# Patient Record
Sex: Male | Born: 1940 | Race: White | Hispanic: No | Marital: Married | Smoking: Former smoker
Health system: Southern US, Community
[De-identification: ages and names within clinical notes are randomized; demographics above are authoritative.]

## PROBLEM LIST (undated history)

## (undated) DIAGNOSIS — I4891 Unspecified atrial fibrillation: Principal | ICD-10-CM

## (undated) DIAGNOSIS — E785 Hyperlipidemia, unspecified: Secondary | ICD-10-CM

## (undated) DIAGNOSIS — N189 Chronic kidney disease, unspecified: Secondary | ICD-10-CM

## (undated) DIAGNOSIS — F419 Anxiety disorder, unspecified: Secondary | ICD-10-CM

## (undated) DIAGNOSIS — R0600 Dyspnea, unspecified: Secondary | ICD-10-CM

## (undated) DIAGNOSIS — D126 Benign neoplasm of colon, unspecified: Secondary | ICD-10-CM

## (undated) DIAGNOSIS — H3530 Unspecified macular degeneration: Secondary | ICD-10-CM

## (undated) DIAGNOSIS — H269 Unspecified cataract: Secondary | ICD-10-CM

## (undated) DIAGNOSIS — F329 Major depressive disorder, single episode, unspecified: Secondary | ICD-10-CM

## (undated) DIAGNOSIS — I10 Essential (primary) hypertension: Secondary | ICD-10-CM

## (undated) DIAGNOSIS — J449 Chronic obstructive pulmonary disease, unspecified: Secondary | ICD-10-CM

## (undated) DIAGNOSIS — F0390 Unspecified dementia without behavioral disturbance: Secondary | ICD-10-CM

## (undated) DIAGNOSIS — C61 Malignant neoplasm of prostate: Secondary | ICD-10-CM

## (undated) DIAGNOSIS — J209 Acute bronchitis, unspecified: Secondary | ICD-10-CM

## (undated) DIAGNOSIS — Z9114 Patient's other noncompliance with medication regimen: Secondary | ICD-10-CM

## (undated) DIAGNOSIS — C3490 Malignant neoplasm of unspecified part of unspecified bronchus or lung: Secondary | ICD-10-CM

## (undated) DIAGNOSIS — R7303 Prediabetes: Secondary | ICD-10-CM

## (undated) DIAGNOSIS — D1391 Familial adenomatous polyposis: Secondary | ICD-10-CM

## (undated) DIAGNOSIS — F32A Depression, unspecified: Secondary | ICD-10-CM

## (undated) DIAGNOSIS — D649 Anemia, unspecified: Secondary | ICD-10-CM

## (undated) HISTORY — DX: Chronic obstructive pulmonary disease, unspecified: J44.9

## (undated) HISTORY — DX: Acute bronchitis, unspecified: J20.9

## (undated) HISTORY — DX: Unspecified macular degeneration: H35.30

## (undated) HISTORY — DX: Malignant neoplasm of prostate: C61

## (undated) HISTORY — DX: Prediabetes: R73.03

## (undated) HISTORY — DX: Benign neoplasm of colon, unspecified: D12.6

## (undated) HISTORY — DX: Hyperlipidemia, unspecified: E78.5

## (undated) HISTORY — DX: Anxiety disorder, unspecified: F41.9

## (undated) HISTORY — PX: OTHER SURGICAL HISTORY: SHX169

## (undated) HISTORY — DX: Familial adenomatous polyposis: D13.91

## (undated) HISTORY — PX: TONSILLECTOMY: SUR1361

## (undated) HISTORY — DX: Unspecified cataract: H26.9

---

## 2001-10-17 ENCOUNTER — Ambulatory Visit: Admission: RE | Admit: 2001-10-17 | Discharge: 2002-01-15 | Payer: Self-pay | Admitting: Radiation Oncology

## 2001-11-19 ENCOUNTER — Encounter: Payer: Self-pay | Admitting: Urology

## 2001-11-22 ENCOUNTER — Encounter: Payer: Self-pay | Admitting: Urology

## 2001-11-26 ENCOUNTER — Encounter (INDEPENDENT_AMBULATORY_CARE_PROVIDER_SITE_OTHER): Payer: Self-pay | Admitting: Specialist

## 2001-11-26 ENCOUNTER — Inpatient Hospital Stay (HOSPITAL_COMMUNITY): Admission: RE | Admit: 2001-11-26 | Discharge: 2001-11-29 | Payer: Self-pay | Admitting: Urology

## 2001-11-26 HISTORY — PX: RETROPUBIC PROSTATECTOMY: SUR1055

## 2002-07-31 ENCOUNTER — Encounter (INDEPENDENT_AMBULATORY_CARE_PROVIDER_SITE_OTHER): Payer: Self-pay

## 2002-08-01 ENCOUNTER — Ambulatory Visit (HOSPITAL_COMMUNITY): Admission: RE | Admit: 2002-08-01 | Discharge: 2002-08-01 | Payer: Self-pay | Admitting: Gastroenterology

## 2003-10-20 ENCOUNTER — Encounter: Admission: RE | Admit: 2003-10-20 | Discharge: 2003-10-20 | Payer: Self-pay | Admitting: Urology

## 2003-11-04 ENCOUNTER — Ambulatory Visit: Admission: RE | Admit: 2003-11-04 | Discharge: 2004-01-27 | Payer: Self-pay | Admitting: Radiation Oncology

## 2003-11-18 ENCOUNTER — Ambulatory Visit (HOSPITAL_COMMUNITY): Admission: RE | Admit: 2003-11-18 | Discharge: 2003-11-18 | Payer: Self-pay | Admitting: Gastroenterology

## 2003-11-18 HISTORY — PX: ESOPHAGOGASTRODUODENOSCOPY: SHX1529

## 2003-11-18 HISTORY — PX: COLONOSCOPY W/ BIOPSIES: SHX1374

## 2004-02-23 ENCOUNTER — Ambulatory Visit: Admission: RE | Admit: 2004-02-23 | Discharge: 2004-02-23 | Payer: Self-pay | Admitting: Radiation Oncology

## 2004-07-26 ENCOUNTER — Ambulatory Visit: Admission: RE | Admit: 2004-07-26 | Discharge: 2004-07-26 | Payer: Self-pay | Admitting: Radiation Oncology

## 2005-09-22 ENCOUNTER — Ambulatory Visit: Payer: Self-pay | Admitting: Family Medicine

## 2005-09-25 ENCOUNTER — Ambulatory Visit: Payer: Self-pay | Admitting: Family Medicine

## 2005-11-03 ENCOUNTER — Ambulatory Visit: Payer: Self-pay | Admitting: Family Medicine

## 2005-11-09 ENCOUNTER — Ambulatory Visit: Payer: Self-pay | Admitting: Family Medicine

## 2006-07-11 ENCOUNTER — Ambulatory Visit: Payer: Self-pay | Admitting: Family Medicine

## 2006-11-02 ENCOUNTER — Ambulatory Visit: Payer: Self-pay | Admitting: Family Medicine

## 2007-06-26 ENCOUNTER — Emergency Department (HOSPITAL_COMMUNITY): Admission: EM | Admit: 2007-06-26 | Discharge: 2007-06-26 | Payer: Self-pay | Admitting: Emergency Medicine

## 2011-02-08 ENCOUNTER — Encounter: Payer: Self-pay | Admitting: Family Medicine

## 2011-02-08 DIAGNOSIS — J449 Chronic obstructive pulmonary disease, unspecified: Secondary | ICD-10-CM

## 2011-02-08 DIAGNOSIS — C61 Malignant neoplasm of prostate: Secondary | ICD-10-CM

## 2011-02-08 DIAGNOSIS — I10 Essential (primary) hypertension: Secondary | ICD-10-CM

## 2011-02-08 DIAGNOSIS — K469 Unspecified abdominal hernia without obstruction or gangrene: Secondary | ICD-10-CM | POA: Insufficient documentation

## 2011-03-31 NOTE — Op Note (Signed)
NAME:  Don [name], Don [name]                          ACCOUNT NO.:  [id number]   MEDICAL RECORD NO.:  [id number]                   PATIENT TYPE:  AMB   LOCATION:  ENDO                                 FACILITY:  [facility]   PHYSICIAN:  Earle Gell, M.D.                DATE OF BIRTH:  03-01-1941   DATE OF PROCEDURE:  11/18/2003  DATE OF DISCHARGE:                                 OPERATIVE REPORT   PROCEDURES:  Esophagogastroduodenoscopy and colonoscopy.   PROCEDURE INDICATION:  Mr. [name] is a 71 year old male, born  Mar 31, 1941.  On July 19, 2002, Mr. [name] underwent a  proctocolonoscopy to the cecum to evaluate guaiac positive stool.  From the  distal sigmoid colon, a small tubular adenomatous polyp was removed and from  the hepatic flexure, a small tubular adenomatous polyp was removed.  There  was no endoscopic evidence for the presence of colorectal cancer.   Mr. [name] intermittently passes fresh blood with bowel movements and has  passed black, tarry stools unassociated with abdominal pain.  He does not  take nonsteroidal anti-inflammatory medication.   Mr. [name] has undergone surgery for prostate cancer.  Due to a rising PSA,  he is scheduled to begin radiation therapy for prostate cancer.   ENDOSCOPIST:  Earle Gell, M.D.   PREMEDICATION:  1. Versed 7 mg.  2. Demerol 70 mg.   PROCEDURE:  Esophagogastroduodenoscopy.   After obtaining informed consent, Mr. [name] was placed in the left lateral  decubitus position.  I administered intravenous Demerol and intravenous  Versed to achieve conscious sedation for the procedure.  [name] blood  pressure, oxygen saturation, and cardiac rhythm were monitored throughout  the procedure and documented in the medical record.   The Olympus gastroscope was passed through the posterior hypopharynx into  the proximal esophagus without difficulty.  The hypopharynx, larynx, and  vocal cords appeared normal.   ESOPHAGOSCOPY:  The proximal, mid, and lower segments of the esophageal  mucosa appear normal.   GASTROSCOPY:  There is a hiatal hernia present.  Retroflexed view of the  gastric cardia and fundus was normal.  The gastric body, antrum, and pylorus  appear normal.   DUODENOSCOPY:  There are a few small erosions without bleeding in the  duodenal bulb; the mid and distal duodenum appear normal.   ASSESSMENT:  1. Esophagus normal esophagogastroduodenoscopy.  2. A few scattered erosions are present at the duodenal bulb of no clinical     significance.   PROCEDURE:  Proctocolonoscopy to the cecum.  Anal inspection was normal.  Digital rectal exam revealed a scarred anal canal.  The Olympus adjustable  pediatric colonoscope was introduced into the rectum and advanced to the  cecum.  Colonic preparation for the exam today was satisfactory.   RECTUM:  Normal.  SIGMOID COLON AND DESCENDING COLON:  Normal.  SPLENIC FLEXURE:  Normal.  TRANSVERSE COLON:  Normal.  HEPATIC  FLEXURE:  Normal.  ASCENDING COLON:  Normal.  CECUM AND ILEOCECAL VALVE:  Normal.   ASSESSMENT:  Severely scarred anal canal; normal proctocolonoscopy to the  cecum.   RECOMMENDATIONS:  Repeat colonoscopy in 5 years.                                               Earle Gell, M.D.    MJ/MEDQ  D:  11/18/2003  T:  11/18/2003  Job:  [id number]   cc:   Marshall Cork. Jeffie Pollock, M.D.  [place]  [place] [address]  Fax: [phone]

## 2011-03-31 NOTE — Op Note (Signed)
[facility]  Patient:    NORVAN, STETTLER Visit Number: [id number] MRN: [id number]          Service Type: SUR Location: 3W [id number] Attending Physician:  Hortencia Pilar Dictated by:   Marshall Cork. Jeffie Pollock, M.D. Proc. Date: 11/26/01 Admit Date:  11/26/2001   CC:         Candace Cruise, M.D., [facility]   Operative Report  PROCEDURE:  Radical retropubic prostatectomy with pelvic lymphadenectomy.  PREOPERATIVE DIAGNOSIS:  Prostate cancer.  POSTOPERATIVE DIAGNOSIS:  Prostate cancer.  SURGEON:  Marshall Cork. Jeffie Pollock, M.D.  ASSISTANT:  Duane Lope. Parks Neptune, M.D.  ANESTHESIA:  General.  DRAIN:  52 French Foley catheter and Blake drain.  SPECIMEN:  Right and left pelvic lymph nodes, prostate, and seminal vesicles.  COMPLICATIONS:  None.  INDICATIONS:  Mr. Manley is a 70 year old white male, who was sent for an elevated PSA of 10.  The biopsy demonstrated a Gleason 7 adenocarcinoma of the prostate involving 10% of the left biopsy cores.  No prostatic nodules were noted.  After discussing the treatment options, he elected radical retropubic prostatectomy.  FINDINGS AT PROCEDURE:  The patient was given 1 g of Ancef.  He was taken to the operating room where a general anesthetic was induced.  He was placed in the supine position with the table slightly flexed.  His lower abdomen was shaved.  He was prepped with Betadine solution and draped in the usual sterile fashion.  A 20 French Foley catheter was inserted after generous lubrication of the urethra.  The balloon was filled with 15 cc of sterile fluid, and the bladder was drained.  A lower midline incision was then made with the knife. The rectus muscles were parted in the midline and the right and left pelvic fossa were exposed with blunt dissection.  A Bookwalter retractor was placed, and node dissection was then performed, first on the right with the limits of dissection being the  external vein, the obturator nerve, the bifurcation of the iliac arteries, and the circumflex iliac vein.  The lymphatic vascular channels were controlled with Hem-o-lok clips.  Once the packet was removed, dissection was performed in an identical fashion on the left side.  We then turned our attention to the prostate gland.  The retractor was repositioned, and the pelvic fascia was incised on both sides and bluntly dissected from the puboprostatic ligaments down to the lateral aspect of the prostate.  It was then incised proximally using a right angle clamp and the Bovie.  Some dissection around the puboprostatic ligaments was performed to free them up. An Allis clamp was then used to grab the prostatic edge of the endopelvic fascia and bring them up into the midline, and a single figure-of-eight stitch was placed at the bladder neck and tied.  The Allis clamp was then removed.  A right angle clamp was placed beneath the dorsal vein complex, and a #1 Vicryl tie was brought around the dorsal vein complex and tied.  The Bovie was then used to divide the dorsal vein complex down to the anterior urethra.  An additional 2-0 Vicryl suture ligature was placed in the dorsal vein complex. A tonsillar clamp was then used to dissect the neurovascular bundles off the urethra laterally and right angles placed beneath the urethra.  A moistened umbilical tape was then passed under the urethra.  The anterior urethra was divided.  The Foley was grasped and pulled into the wound.  It  was clamped and cut and used to provide cephalad traction.  The posterior aspect of the urethra was then divided, and the umbilical tape was removed.  The rectourethralis muscles were taken down sharply, and the posterior aspect of the prostate was dissected off the rectal wall.  The lateral pedicles were then taken down using a right angle clamp and right angle clips with care being taken to avoid injury to the neurovascular  bundle.  Once the prostate had been reflected up sufficiently, the anterior leaf of Denonvilliers fascia was incised, exposing the ampulla of the vas and the seminal vesicles.  The ampulla of the vas were divided between large clips.  We then turned our attention anterior where the bladder neck was grasped between Allis clamps. Once again, a tonsillar clamp was used to dissect the muscle fibers from the bladder neck down to the mucosa.  The anterior aspect was opened;  the Foley balloon was drained, and the catheter was then used to provide prostatic traction.  The posterior bladder neck was then taken down in a similar fashion with a tonsil clamp and Bovie until the prostate was attached only by the seminal vesicles.  Clamps were placed across the base of the seminal vesicles, and then they were divided, and the specimen was removed.  This area was inspected for hemostasis.  A couple of small bleeders were dealt with with clips and the Bovie.  The patient had been given indigo carmine and ureteral orifices were noted to be well away from the bladder neck which was minimally disrupted by the dissection.  Then 4-0 chromic stitches were placed to evert the bladder neck mucosa; four stitches were placed, and a single 2-0 chromic figure-of-eight stitch was placed at the 6 oclock position to tighten the bladder neck slightly.  Once the bladder neck had been reconstructed, the pelvis was irrigated and inspected for hemostasis.  A small bleeder along the left bundle was controlled with a clip, with care to place this in a very tangential fashion.  A fresh Foley catheter was then inserted, and the anastomotic sutures were placed using 2-0 Vicryl.  The sutures were placed at 2, 5, 7, and 10 oclock.  A #1 Prolene was then placed through the eyes of the Foley and then brought through the bladder neck and out the anterior bladder wall.  The Foley was placed within the bladder.  The balloon was inflated  with 15 cc of sterile fluid, and the final 12 oclock anastomotic stitch was placed  through the bladder neck and urethral stump.  The retractors were relaxed, and the bladder neck was pulled down to the urethral stump, and the anastomotic sutures were tied and trimmed.  The anastomosis was tested and was found to be watertight.  The tethering suture was brought through the right abdominal wall.  A #10 flat fully-fluted Blake drain was placed through the left abdominal wall and secured with a 2-0 silk suture and placed to bulb suction. The drain itself was draped over the anastomosis into the pelvic fossa.  At this point, the tethering suture was tied over a button with care to ensure minimal tension, and the wound was then closed using a running #1 PDS suture. The subcutaneous tissues were irrigated, and the skin was closed with clips. A dressing was applied; the Foley was placed to straight drainage.  The patient was taken out of flexion.  His anesthetic was reversed.  He was moved to the recovery room in stable condition.  There were no complications during the procedure. Dictated by:   Marshall Cork. Jeffie Pollock, M.D. Attending Physician:  Hortencia Pilar DD:  11/26/01 TD:  11/26/01 Job: [id number]

## 2011-03-31 NOTE — Op Note (Signed)
   NAME:  Don [name], Don [name]                          ACCOUNT NO.:  [id number]   MEDICAL RECORD NO.:  [id number]                   PATIENT TYPE:  AMB   LOCATION:  ENDO                                 FACILITY:  [facility]   PHYSICIAN:  Earle Gell, MD                  DATE OF BIRTH:  03-31-1941   DATE OF PROCEDURE:  08/01/2002  DATE OF DISCHARGE:  08/01/2002                                 OPERATIVE REPORT   REFERRING PHYSICIAN:  Lolita Lenz, M.D.   PROCEDURE PERFORMED:  Colonoscopy.   ENDOSCOPIST:  Garlan Fair, M.D.   INDICATIONS FOR PROCEDURE:  The patient is a 70 year old  male born  1940/12/10.  [name] is undergoing a diagnostic colonoscopy to  evaluate guaiac positive stool.   PREMEDICATION:  Fentanyl 50 mcg, Versed 7 mg.   INSTRUMENT USED:  Pediatric Olympus video colonoscope.   DESCRIPTION OF PROCEDURE:  After obtaining informed consent, the patient was  placed in the left lateral decubitus position.  I administered intravenous  fentanyl and intravenous Versed to achieve conscious sedation for the  procedure.  [name] blood pressure, oxygen saturations and cardiac  rhythm were monitored throughout the procedure and documented in the medical  record.   Anal inspection was normal.  Digital rectal exam was normal.  The pediatric  Olympus video colonoscope was introduced into the rectum and easily advanced  to the cecum.  Colonic preparation for the exam today was excellent.   Rectum:  Normal.   Sigmoid colon and descending colon:  From the distal sigmoid colon at 20 cm  from the anal verge a 2 mm sessile polyp was removed with the electrocautery  snare.   Splenic flexure:  Normal.   Transverse colon:  Normal.   Hepatic flexure:  From the hepatic flexure, a 1 mm sessile polyp was removed  with a hot biopsy forceps.   Ascending colon:  Normal.   Ileocecal valve and cecum:  Normal.    ASSESSMENT:  1. From the hepatic flexure, a 1 mm  sessile polyp was removed.  2. From the distal sigmoid colon a 2 mm sessile polyp was removed.                                                 Earle Gell, MD    MJ/MEDQ  D:  08/01/2002  T:  08/04/2002  Job:  [id number]

## 2011-03-31 NOTE — Discharge Summary (Signed)
[facility]  Patient:    Don [name], Don [name] Visit Number: [id number] MRN: [id number]          Service Type: SUR Location: 3W [id number] Attending Physician:  Hortencia Pilar Dictated by:   Marshall Cork. Jeffie Pollock, M.D. Admit Date:  11/26/2001 Discharge Date: 11/29/2001                             Discharge Summary  HISTORY:  [name] is a 70 year old white male sent by Dr. Jeanie Cooks for a PSA elevation of 10.  He was initially treated with antibiotics; the PSA came down, but only to 8.84.  A prostate biopsy was performed which revealed 20% of the left prostate being involved with Gleasons 7 adenocarcinoma of the prostate.  The patient had significant obstructive voiding symptoms with a prostate symptom score of 17 and after discussing the options has elected radical prostatectomy.  ALLERGIES:  Significant for no allergies.  MEDICATIONS:  Include prostate herbal supplement, vitamin C crystals.  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  SURGICAL HISTORY:  Drainage of perirectal abscess in 1979 and hemorrhoidectomy in 1997.  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  He denies alcohol.  He works for [employer].  He is married.  FAMILY HISTORY:  Pertinent for heart disease, hypertension, diabetes.  REVIEW OF SYSTEMS:  He has had some cough, voiding difficulty, constipation, frequency, and urgency.  He is otherwise without complaints.  PHYSICAL EXAMINATION:  VITAL SIGNS:  Blood pressure 151/88, heart rate 72, temperature 97.8.  GENERAL:  He is a well-developed, well-nourished white male in no acute distress, alert and oriented x3.  HEENT:  Head/face normocephalic, atraumatic.  NECK:  Supple without thyromegaly or bruits.  LYMPHATICS:  He has no cervical, axillary, or inguinal adenopathy.  LUNGS:  Clear.  HEART:  Regular rate and rhythm.  ABDOMEN:  Soft, flat,  nontender without masses, lesions, or CVA tenderness, or hepatosplenomegaly.  GU:  Reveals an unremarkable phallus, normal meatus, scrotum is unremarkable. Testicles are bilaterally descended, normal in size and consistency without masses or tenderness.  Epididymis are unremarkable, no inguinal hernias are noted.  Anus and perineum without lesions.  RECTAL:  Reveals normal sphincter tone.  Prostate is 2+ in size and slightly tender without nodules.  Seminal vesicles are nonpalpable.  No rectal masses are noted.  EXTREMITIES:  Full range of motion without edema.  NEUROLOGIC:  Grossly intact.  SKIN:  Warm and dry.  IMPRESSION:  Prostate cancer.  PLAN:  Radical prostatectomy.  ACCESSORY CLINICAL INFORMATION:  Hemoglobin 14.3, hematocrit 40.5.  Chest x-ray:  COPD.  There was a questionable nodule in the left upper chest.  CT scan was obtained which was clear.  EKG revealed normal sinus rhythm with sinus arrhythmia, nonspecific ST abnormality which is minor.  Urine today is clear.  HOSPITAL COURSE:  On the day of admission, the patient was taken to the operating room where a radical retropubic prostatectomy with pelvic lymphadenectomy was performed without complications.  He was left with a Blake drain and Foley catheter.  Postoperatively, he did well on his first postoperative day.  He was afebrile; he was started on clear liquids. His Jackson-Pratt had minimal drainage and he had good bowel sounds. His O2 was discontinued, his PAS hose were removed, his IV fluids were decreased, and he was  encouraged to ambulate.  On the second postoperative day, he was tolerating liquids.  He was still sore.  He had not had a bowel movement and was still weak and not ready for discharge.  His Jackson-Pratt had minimal drainage and was removed.  His PCA morphine pump was removed.  His IV was hep-locked.  His diet was increased.  He was given a Dulcolax suppository and that produced a bowel  movement.  That evening, he was complaining of some indigestion; Pepcid was ordered with a good response.  On May 25, 2002, he was doing well without complaints and was felt to be ready for discharge home.  FINAL DIAGNOSES:  Prostate cancer with a pathology of T3b N0 M0 Gleasons 7 adenocarcinoma of the prostate.  The margin was microscopically positive in the area of the seminal vesicle and there was some involvement of the left seminal vesicle.  COMPLICATIONS:  There were no complications during this admission.  DISCHARGE MEDICATIONS:  Levaquin 250 mg q.d. to begin the day before catheter removal and Vicodin 1-2 p.o. q.4-6h. p.r.n. pain.  DISCHARGE INSTRUCTIONS:  His activity restrictions were explained.  He was instructed to follow up with me in one week for staple removal.  DISPOSITION:  Home.  PROGNOSIS:  Good.  CONDITION:  Improved. Dictated by:   Marshall Cork. Jeffie Pollock, M.D. Attending Physician:  Hortencia Pilar DD:  11/29/01 TD:  12/02/01 Job: [id number]

## 2011-06-15 ENCOUNTER — Other Ambulatory Visit: Payer: Self-pay | Admitting: Surgery

## 2011-08-08 ENCOUNTER — Emergency Department (HOSPITAL_COMMUNITY)
Admission: EM | Admit: 2011-08-08 | Discharge: 2011-08-09 | Disposition: A | Discharge status: Home / Self Care | Payer: Medicare Other | Attending: Emergency Medicine | Admitting: Emergency Medicine

## 2011-08-08 DIAGNOSIS — L851 Acquired keratosis [keratoderma] palmaris et plantaris: Secondary | ICD-10-CM | POA: Insufficient documentation

## 2011-08-08 DIAGNOSIS — F329 Major depressive disorder, single episode, unspecified: Secondary | ICD-10-CM | POA: Insufficient documentation

## 2011-08-08 DIAGNOSIS — R45851 Suicidal ideations: Secondary | ICD-10-CM | POA: Insufficient documentation

## 2011-08-08 DIAGNOSIS — F3289 Other specified depressive episodes: Secondary | ICD-10-CM | POA: Insufficient documentation

## 2011-08-08 DIAGNOSIS — Z8546 Personal history of malignant neoplasm of prostate: Secondary | ICD-10-CM | POA: Insufficient documentation

## 2011-08-08 DIAGNOSIS — I10 Essential (primary) hypertension: Secondary | ICD-10-CM | POA: Insufficient documentation

## 2011-08-08 LAB — COMPREHENSIVE METABOLIC PANEL
Alkaline Phosphatase: 95 U/L (ref 39–117)
BUN: 20 mg/dL (ref 6–23)
CO2: 26 mEq/L (ref 19–32)
GFR calc Af Amer: >60 mL/min (ref >60)
GFR calc non Af Amer: 54 mL/min — ABNORMAL LOW (ref >60)
Glucose, Bld: 99 mg/dL (ref 70–99)
Potassium: 4.5 mEq/L (ref 3.5–5.1)
Total Bilirubin: 0.3 mg/dL (ref 0.3–1.2)
Total Protein: 7.1 g/dL (ref 6.0–8.3)

## 2011-08-08 LAB — CBC
HCT: 40.8 % (ref 39.0–52.0)
Hemoglobin: 14.1 g/dL (ref 13.0–17.0)
MCHC: 34.6 g/dL (ref 30.0–36.0)

## 2011-08-08 LAB — DIFFERENTIAL
Basophils Absolute: 0 10*3/uL (ref 0.0–0.1)
Lymphocytes Relative: 26 % (ref 12–46)
Monocytes Absolute: 0.7 10*3/uL (ref 0.1–1.0)
Monocytes Relative: 10 % (ref 3–12)
Neutro Abs: 4.5 10*3/uL (ref 1.7–7.7)

## 2011-08-08 LAB — ETHANOL: Alcohol, Ethyl (B): <11 mg/dL (ref 0–11)

## 2011-08-08 LAB — RAPID URINE DRUG SCREEN, HOSP PERFORMED: Amphetamines: NOT DETECTED

## 2011-08-28 LAB — DIFFERENTIAL
Basophils Absolute: 0
Eosinophils Absolute: 0.2
Eosinophils Relative: 2

## 2011-08-28 LAB — COMPREHENSIVE METABOLIC PANEL
ALT: 26
AST: 27
CO2: 25
Chloride: 100
Creatinine, Ser: 1.84 — ABNORMAL HIGH
GFR calc Af Amer: 45 — ABNORMAL LOW
GFR calc non Af Amer: 37 — ABNORMAL LOW
Sodium: 135
Total Bilirubin: 1.6 — ABNORMAL HIGH

## 2011-08-28 LAB — CBC
MCV: 94.8
RBC: 3.95 — ABNORMAL LOW
WBC: 8

## 2013-02-13 ENCOUNTER — Telehealth: Payer: Self-pay | Admitting: Family Medicine

## 2013-02-13 NOTE — Telephone Encounter (Signed)
?  about referral for CT

## 2013-02-14 NOTE — Telephone Encounter (Signed)
Patient was advised to come in for chest xray first.  He has not come in yet

## 2013-02-14 NOTE — Telephone Encounter (Signed)
Pt aware needs chest xr and informed last cxr 12-15-2011

## 2013-03-12 ENCOUNTER — Other Ambulatory Visit: Payer: Self-pay | Admitting: *Deleted

## 2013-03-12 MED ORDER — ZOLPIDEM TARTRATE 10 MG PO TABS: 10.0000 mg | ORAL_TABLET | Freq: Every evening | ORAL | Status: DC | PRN

## 2013-03-12 NOTE — Telephone Encounter (Signed)
LAST REFILL 12/04/12. LAST OV 12/16/12. PLEASE CALL IN [facility]

## 2013-03-12 NOTE — Telephone Encounter (Signed)
Please call in [facility] 10 mg 1 PO qhs #30 0 refills

## 2013-03-13 NOTE — Telephone Encounter (Signed)
Pt aware rx for zolpidem called to [facility]

## 2013-03-20 ENCOUNTER — Telehealth: Payer: Self-pay | Admitting: Family Medicine

## 2013-03-20 NOTE — Telephone Encounter (Signed)
appt given for 5/9

## 2013-03-21 ENCOUNTER — Encounter: Payer: Self-pay | Admitting: General Practice

## 2013-03-21 ENCOUNTER — Ambulatory Visit (INDEPENDENT_AMBULATORY_CARE_PROVIDER_SITE_OTHER): Payer: Medicare Other | Admitting: General Practice

## 2013-03-21 VITALS — BP 140/78 | HR 71 | Temp 97.0°F | Ht 68.0 in | Wt 164.0 lb

## 2013-03-21 DIAGNOSIS — L0390 Cellulitis, unspecified: Secondary | ICD-10-CM

## 2013-03-21 DIAGNOSIS — L0291 Cutaneous abscess, unspecified: Secondary | ICD-10-CM

## 2013-03-21 MED ORDER — CEPHALEXIN 500 MG PO CAPS: 500.0000 mg | ORAL_CAPSULE | Freq: Two times a day (BID) | ORAL | Status: DC

## 2013-03-21 NOTE — Patient Instructions (Addendum)
Cellulitis Cellulitis is an infection of the skin and the tissue beneath it. The infected area is usually red and tender. Cellulitis occurs most often in the arms and lower legs.   CAUSES   Cellulitis is caused by bacteria that enter the skin through cracks or cuts in the skin. The most common types of bacteria that cause cellulitis are Staphylococcus and Streptococcus. SYMPTOMS    Redness and warmth.   Swelling.   Tenderness or pain.   Fever.  DIAGNOSIS  Your caregiver can usually determine what is wrong based on a physical exam. Blood tests may also be done. TREATMENT   Treatment usually involves taking an antibiotic medicine. HOME CARE INSTRUCTIONS    Take your antibiotics as directed. Finish them even if you start to feel better.   Keep the infected arm or leg elevated to reduce swelling.   Apply a warm cloth to the affected area up to 4 times per day to relieve pain.   Only take over-the-counter or prescription medicines for pain, discomfort, or fever as directed by your caregiver.   Keep all follow-up appointments as directed by your caregiver.  SEEK MEDICAL CARE IF:    You notice red streaks coming from the infected area.   Your red area gets larger or turns dark in color.   Your bone or joint underneath the infected area becomes painful after the skin has healed.   Your infection returns in the same area or another area.   You notice a swollen bump in the infected area.   You develop new symptoms.  SEEK IMMEDIATE MEDICAL CARE IF:    You have a fever.   You feel very sleepy.   You develop vomiting or diarrhea.   You have a general ill feeling (malaise) with muscle aches and pains.  MAKE SURE YOU:    Understand these instructions.   Will watch your condition.   Will get help right away if you are not doing well or get worse.  Document Released: 08/09/2005 Document Revised: 04/30/2012 Document Reviewed: 01/15/2012 [employer] Patient Information 2013  [employer].    

## 2013-03-21 NOTE — Progress Notes (Signed)
  Subjective:    Patient ID: Don [name], male    DOB: 1941/06/22, 72 y.o.   MRN: [id number]  HPI Presents today with red, irritated area to left mid back. Reports on Friday he felt a sore area on his back and reached around and scratched it. OTC medications not used. Denies knowingly being bitten by insect.     Review of Systems  Constitutional: Negative for fever and chills.  Respiratory: Negative for chest tightness and shortness of breath.   Cardiovascular: Negative for chest pain.  Musculoskeletal: Negative for myalgias.  Skin:       Red area to left mid back  Neurological: Negative for dizziness and headaches.  Psychiatric/Behavioral: Negative.        Objective:   Physical Exam  Constitutional: He is oriented to person, place, and time. He appears well-developed and well-nourished.  Cardiovascular: Normal rate, regular rhythm and normal heart sounds.   Pulmonary/Chest: Effort normal and breath sounds normal.  Neurological: He is alert and oriented to person, place, and time.  Skin: Skin is warm and dry. There is erythema.  Small erythematous area to mid left back, size of pencil eraser. Negative fluid expressed.   Psychiatric: He has a normal mood and affect.          Assessment & Plan:  1. Cellulitis - cephALEXin (KEFLEX) 500 MG capsule; Take 1 capsule (500 mg total) by mouth 2 (two) times daily.  Dispense: 20 capsule; Refill: 0 Keep area clean and dry RTO if symptoms worsen or unresolved Patient verbalized understanding Erby Pian, FNP-C

## 2013-04-09 ENCOUNTER — Other Ambulatory Visit: Payer: Self-pay | Admitting: Family Medicine

## 2013-04-13 DIAGNOSIS — Z91148 Patient's other noncompliance with medication regimen for other reason: Secondary | ICD-10-CM

## 2013-04-13 DIAGNOSIS — Z9114 Patient's other noncompliance with medication regimen: Secondary | ICD-10-CM

## 2013-04-13 HISTORY — DX: Patient's other noncompliance with medication regimen: Z91.14

## 2013-04-13 HISTORY — DX: Patient's other noncompliance with medication regimen for other reason: Z91.148

## 2013-04-25 ENCOUNTER — Emergency Department (HOSPITAL_COMMUNITY): Payer: Medicare Other

## 2013-04-25 ENCOUNTER — Inpatient Hospital Stay (HOSPITAL_COMMUNITY)
Admission: EM | Admit: 2013-04-25 | Discharge: 2013-04-28 | DRG: 310 | Disposition: A | Discharge status: Home / Self Care | Payer: Medicare Other | Attending: Internal Medicine | Admitting: Internal Medicine

## 2013-04-25 ENCOUNTER — Encounter (HOSPITAL_COMMUNITY): Payer: Self-pay | Admitting: *Deleted

## 2013-04-25 ENCOUNTER — Telehealth: Payer: Self-pay | Admitting: Family Medicine

## 2013-04-25 DIAGNOSIS — I517 Cardiomegaly: Secondary | ICD-10-CM

## 2013-04-25 DIAGNOSIS — Z720 Tobacco use: Secondary | ICD-10-CM | POA: Diagnosis present

## 2013-04-25 DIAGNOSIS — J4489 Other specified chronic obstructive pulmonary disease: Secondary | ICD-10-CM | POA: Diagnosis present

## 2013-04-25 DIAGNOSIS — Z79899 Other long term (current) drug therapy: Secondary | ICD-10-CM

## 2013-04-25 DIAGNOSIS — I10 Essential (primary) hypertension: Secondary | ICD-10-CM | POA: Diagnosis present

## 2013-04-25 DIAGNOSIS — Z8249 Family history of ischemic heart disease and other diseases of the circulatory system: Secondary | ICD-10-CM

## 2013-04-25 DIAGNOSIS — K469 Unspecified abdominal hernia without obstruction or gangrene: Secondary | ICD-10-CM

## 2013-04-25 DIAGNOSIS — I4891 Unspecified atrial fibrillation: Secondary | ICD-10-CM | POA: Diagnosis not present

## 2013-04-25 DIAGNOSIS — I4892 Unspecified atrial flutter: Principal | ICD-10-CM | POA: Diagnosis present

## 2013-04-25 DIAGNOSIS — C61 Malignant neoplasm of prostate: Secondary | ICD-10-CM

## 2013-04-25 DIAGNOSIS — Z8546 Personal history of malignant neoplasm of prostate: Secondary | ICD-10-CM

## 2013-04-25 DIAGNOSIS — F17200 Nicotine dependence, unspecified, uncomplicated: Secondary | ICD-10-CM | POA: Diagnosis present

## 2013-04-25 DIAGNOSIS — E785 Hyperlipidemia, unspecified: Secondary | ICD-10-CM | POA: Diagnosis present

## 2013-04-25 DIAGNOSIS — J449 Chronic obstructive pulmonary disease, unspecified: Secondary | ICD-10-CM | POA: Diagnosis present

## 2013-04-25 DIAGNOSIS — Z9079 Acquired absence of other genital organ(s): Secondary | ICD-10-CM

## 2013-04-25 DIAGNOSIS — Z833 Family history of diabetes mellitus: Secondary | ICD-10-CM

## 2013-04-25 LAB — HEPATIC FUNCTION PANEL
AST: 88 U/L — ABNORMAL HIGH (ref 0–37)
Albumin: 3.3 g/dL — ABNORMAL LOW (ref 3.5–5.2)
Bilirubin, Direct: 0.3 mg/dL (ref 0.0–0.3)

## 2013-04-25 LAB — BASIC METABOLIC PANEL
Chloride: 101 mEq/L (ref 96–112)
Creatinine, Ser: 1.34 mg/dL (ref 0.50–1.35)
GFR calc Af Amer: 59 mL/min — ABNORMAL LOW (ref >90)
Potassium: 4.3 mEq/L (ref 3.5–5.1)

## 2013-04-25 LAB — CBC
Platelets: 217 10*3/uL (ref 150–400)
RDW: 13.7 % (ref 11.5–15.5)
WBC: 9.1 10*3/uL (ref 4.0–10.5)

## 2013-04-25 LAB — MRSA PCR SCREENING: MRSA by PCR: NEGATIVE

## 2013-04-25 LAB — TROPONIN I
Troponin I: <0.3 ng/mL (ref <0.30)
Troponin I: <0.3 ng/mL (ref <0.30)

## 2013-04-25 MED ORDER — TIOTROPIUM BROMIDE MONOHYDRATE 18 MCG IN CAPS
18.0000 ug | ORAL_CAPSULE | Freq: Every day | RESPIRATORY_TRACT | Status: DC | PRN
  Filled 2013-04-25: qty 5

## 2013-04-25 MED ORDER — ZOLPIDEM TARTRATE 5 MG PO TABS
5.0000 mg | ORAL_TABLET | Freq: Every evening | ORAL | Status: DC | PRN
  Administered 2013-04-25 – 2013-04-27 (×3): 5 mg via ORAL
  Filled 2013-04-25 (×3): qty 1

## 2013-04-25 MED ORDER — ZOLPIDEM TARTRATE 5 MG PO TABS: 10.0000 mg | ORAL_TABLET | Freq: Every evening | ORAL | Status: DC | PRN

## 2013-04-25 MED ORDER — METHYLPREDNISOLONE SODIUM SUCC 125 MG IJ SOLR
80.0000 mg | Freq: Two times a day (BID) | INTRAMUSCULAR | Status: DC
  Administered 2013-04-25 (×2): 80 mg via INTRAVENOUS
  Filled 2013-04-25 (×2): qty 2

## 2013-04-25 MED ORDER — ACETAMINOPHEN 325 MG PO TABS
650.0000 mg | ORAL_TABLET | Freq: Four times a day (QID) | ORAL | Status: DC | PRN
  Administered 2013-04-25: 650 mg via ORAL
  Filled 2013-04-25: qty 2

## 2013-04-25 MED ORDER — AMLODIPINE BESYLATE 5 MG PO TABS: 10.0000 mg | ORAL_TABLET | Freq: Every day | ORAL | Status: DC

## 2013-04-25 MED ORDER — OMEGA-3 FATTY ACIDS 1000 MG PO CAPS: 1.0000 g | ORAL_CAPSULE | Freq: Two times a day (BID) | ORAL | Status: DC

## 2013-04-25 MED ORDER — POLYVINYL ALCOHOL 1.4 % OP SOLN
1.0000 [drp] | Freq: Two times a day (BID) | OPHTHALMIC | Status: DC
  Administered 2013-04-25 – 2013-04-28 (×6): 1 [drp] via OPHTHALMIC
  Filled 2013-04-25: qty 15

## 2013-04-25 MED ORDER — SIMVASTATIN 20 MG PO TABS: 20.0000 mg | ORAL_TABLET | Freq: Every day | ORAL | Status: DC

## 2013-04-25 MED ORDER — RIVAROXABAN 10 MG PO TABS
15.0000 mg | ORAL_TABLET | Freq: Every day | ORAL | Status: DC
  Administered 2013-04-25: 15 mg via ORAL
  Filled 2013-04-25: qty 2

## 2013-04-25 MED ORDER — DILTIAZEM HCL 25 MG/5ML IV SOLN
10.0000 mg | Freq: Once | INTRAVENOUS | Status: AC
  Administered 2013-04-25 (×2): 10 mg via INTRAVENOUS

## 2013-04-25 MED ORDER — DILTIAZEM HCL 100 MG IV SOLR
5.0000 mg/h | INTRAVENOUS | Status: DC
  Administered 2013-04-25: 10 mg/h via INTRAVENOUS
  Administered 2013-04-25: 5 mg/h via INTRAVENOUS
  Administered 2013-04-25: 10 mg/h via INTRAVENOUS

## 2013-04-25 MED ORDER — CLONAZEPAM 0.5 MG PO TABS
0.5000 mg | ORAL_TABLET | Freq: Two times a day (BID) | ORAL | Status: DC | PRN
  Administered 2013-04-25 – 2013-04-27 (×5): 0.5 mg via ORAL
  Filled 2013-04-25 (×5): qty 1

## 2013-04-25 MED ORDER — OMEGA-3-ACID ETHYL ESTERS 1 G PO CAPS
1.0000 g | ORAL_CAPSULE | Freq: Two times a day (BID) | ORAL | Status: DC
  Administered 2013-04-25 – 2013-04-28 (×6): 1 g via ORAL
  Filled 2013-04-25 (×6): qty 1

## 2013-04-25 MED ORDER — TAMSULOSIN HCL 0.4 MG PO CAPS
0.4000 mg | ORAL_CAPSULE | Freq: Every day | ORAL | Status: DC
  Administered 2013-04-26 – 2013-04-28 (×3): 0.4 mg via ORAL
  Filled 2013-04-25 (×3): qty 1

## 2013-04-25 MED ORDER — POLYETHYL GLYCOL-PROPYL GLYCOL 0.4-0.3 % OP SOLN: 1.0000 [drp] | Freq: Two times a day (BID) | OPHTHALMIC | Status: DC

## 2013-04-25 MED ORDER — SODIUM CHLORIDE 0.9 % IJ SOLN
3.0000 mL | Freq: Two times a day (BID) | INTRAMUSCULAR | Status: DC
  Administered 2013-04-25 – 2013-04-28 (×5): 3 mL via INTRAVENOUS

## 2013-04-25 MED ORDER — ADULT MULTIVITAMIN W/MINERALS CH: 1.0000 | ORAL_TABLET | Freq: Every day | ORAL | Status: DC

## 2013-04-25 MED ORDER — ATORVASTATIN CALCIUM 10 MG PO TABS
10.0000 mg | ORAL_TABLET | Freq: Every day | ORAL | Status: DC
  Administered 2013-04-25 – 2013-04-27 (×3): 10 mg via ORAL
  Filled 2013-04-25 (×3): qty 1

## 2013-04-25 MED ORDER — ADULT MULTIVITAMIN W/MINERALS CH
1.0000 | ORAL_TABLET | Freq: Every day | ORAL | Status: DC
  Administered 2013-04-26 – 2013-04-28 (×3): 1 via ORAL
  Filled 2013-04-25 (×3): qty 1

## 2013-04-25 MED ORDER — DULOXETINE HCL 60 MG PO CPEP
60.0000 mg | ORAL_CAPSULE | Freq: Every day | ORAL | Status: DC
  Administered 2013-04-26 – 2013-04-28 (×3): 60 mg via ORAL
  Filled 2013-04-25 (×3): qty 1

## 2013-04-25 MED ORDER — HEPARIN SODIUM (PORCINE) 5000 UNIT/ML IJ SOLN: 5000.0000 [IU] | Freq: Three times a day (TID) | INTRAMUSCULAR | Status: DC

## 2013-04-25 MED ORDER — BUDESONIDE-FORMOTEROL FUMARATE 160-4.5 MCG/ACT IN AERO
2.0000 | INHALATION_SPRAY | Freq: Two times a day (BID) | RESPIRATORY_TRACT | Status: DC | PRN
  Administered 2013-04-26: 2 via RESPIRATORY_TRACT
  Filled 2013-04-25: qty 6

## 2013-04-25 MED ORDER — DIGOXIN 250 MCG PO TABS
0.2500 mg | ORAL_TABLET | Freq: Every day | ORAL | Status: DC
  Administered 2013-04-26 – 2013-04-28 (×3): 0.25 mg via ORAL
  Filled 2013-04-25 (×3): qty 1

## 2013-04-25 MED ORDER — DIGOXIN 0.25 MG/ML IJ SOLN
0.5000 mg | Freq: Once | INTRAMUSCULAR | Status: AC
  Administered 2013-04-25: 0.5 mg via INTRAVENOUS
  Filled 2013-04-25: qty 2

## 2013-04-25 MED ORDER — OCUVITE PO TABS
1.0000 | ORAL_TABLET | Freq: Every day | ORAL | Status: DC
  Administered 2013-04-26 – 2013-04-28 (×3): 1 via ORAL
  Filled 2013-04-25 (×5): qty 1

## 2013-04-25 MED ORDER — SODIUM CHLORIDE 0.9 % IV SOLN
INTRAVENOUS | Status: DC
  Administered 2013-04-26: via INTRAVENOUS

## 2013-04-25 MED ORDER — DIGOXIN 0.25 MG/ML IJ SOLN
0.2500 mg | Freq: Once | INTRAMUSCULAR | Status: AC
  Administered 2013-04-25: 0.25 mg via INTRAVENOUS
  Filled 2013-04-25: qty 2

## 2013-04-25 MED ORDER — ASPIRIN EC 81 MG PO TBEC
81.0000 mg | DELAYED_RELEASE_TABLET | Freq: Every day | ORAL | Status: DC
  Administered 2013-04-26 – 2013-04-28 (×3): 81 mg via ORAL
  Filled 2013-04-25 (×3): qty 1

## 2013-04-25 NOTE — Progress Notes (Signed)
Information requested via fax from Dr Einar Gip, in [place].

## 2013-04-25 NOTE — H&P (Signed)
[facility] History and Physical  Don [name] [id number] DOB: 08-17-1941 DOA: 04/25/2013  Referring physician: ER. PCP: Anthoney Harada, MD    Chief Complaint: Chest pain, palpitations, dyspnea.  HPI: Don [name] is a 72 y.o. male who presents to the hospital with a 2 to three-day history of headaches, chest pain, palpitations and dyspnea associated with a cough productive of white sputum. He has had no fever. He does not have a history of coronary artery disease but he does have a history of COPD. He continues to smoke cigarettes. He has been feeling somewhat lightheaded and dizzy with the palpitations. When he presented to the emergency room, he was found to be in atrial flutter with rapid ventricular response around 160. He has been started on a Cardizem drip and his ventricular rate is much better controlled now. He cannot be very specific about the type of chest pain he was having. His initial cardiac enzyme is negative. He is now being referred for remission.  Review of Systems:   Apart from history of present illness, other systems negative.  Past Medical History  Diagnosis Date  . Hyperlipidemia   . Hypertension   . COPD (chronic obstructive pulmonary disease)   . Cancer    Past Surgical History  Procedure Laterality Date  . 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 Anal abcess,hemorroids,     Social History:  reports that he has been smoking Cigarettes and Cigars.  He has been smoking about 0.00 packs per day. He does not have any smokeless tobacco history on file. He reports that he does not drink alcohol or use illicit drugs.    Allergies  Allergen Reactions  . Bupropion Anxiety    Family History  Problem Relation Age of Onset  . Diabetes Father   . Heart disease Father     MI      Prior to Admission medications   Medication Sig Start Date End Date Taking? Authorizing Provider  amLODipine (NORVASC) 10 MG tablet Take 10 mg by mouth daily.   Yes  Historical Provider, MD  aspirin EC 81 MG tablet Take 81 mg by mouth daily.   Yes Historical Provider, MD  beta carotene w/minerals (OCUVITE) tablet Take 1 tablet by mouth daily.   Yes Historical Provider, MD  budesonide-formoterol (SYMBICORT) 160-4.5 MCG/ACT inhaler Inhale 2 puffs into the lungs 2 (two) times daily as needed.     Yes Historical Provider, MD  clonazePAM (KLONOPIN) 0.5 MG tablet Take 0.5 mg by mouth 2 (two) times daily as needed for anxiety.  03/04/13  Yes Historical Provider, MD  DULoxetine (CYMBALTA) 60 MG capsule  03/17/13  Yes Historical Provider, MD  fish oil-omega-3 fatty acids 1000 MG capsule Take 1 g by mouth 2 (two) times daily.   Yes Historical Provider, MD  fluorouracil (EFUDEX) 5 % cream Apply 1 application topically daily as needed.  03/13/13  Yes Historical Provider, MD  mometasone (ELOCON) 0.1 % cream Apply 1 application topically 2 (two) times daily as needed.  03/13/13  Yes Historical Provider, MD  Multiple Vitamin (MULTIVITAMIN WITH MINERALS) TABS Take 1 tablet by mouth daily.   Yes Historical Provider, MD  Polyethyl Glycol-Propyl Glycol (SYSTANE OP) Place 1 drop into both eyes 2 (two) times daily.   Yes Historical Provider, MD  tamsulosin (FLOMAX) 0.4 MG CAPS Take 0.4 mg by mouth daily.   Yes Historical Provider, MD  tiotropium (SPIRIVA) 18 MCG inhalation capsule Place 18 mcg into inhaler and inhale as needed.  Yes Historical Provider, MD  zolpidem (AMBIEN) 10 MG tablet Take 10 mg by mouth at bedtime as needed for sleep.   Yes Historical Provider, MD  pravastatin (PRAVACHOL) 40 MG tablet Take 40 mg by mouth daily.    Historical Provider, MD   Physical Exam: Filed Vitals:   04/25/13 0947 04/25/13 1000 04/25/13 1020 04/25/13 1123  BP: 113/90 118/84 119/82 118/88  Pulse: 119 69 60 69  Temp:      TempSrc:      Resp: 20 11 13 17   SpO2: 95% 97% 96% 96%     General:  He looks systemically well. Is not toxic or septic.  Eyes: No pallor. No jaundice.  ENT: No  abnormalities.  Neck: No lymphadenopathy.  Cardiovascular: Irregular heart sounds. No murmurs. Jugular venous pressure not raised.  Respiratory: Bilateral expiratory wheezing without increased work of breathing. There is no peripheral or central cyanosis.  Abdomen: Soft, nontender. No masses felt. No hepatosplenomegaly.  Skin: No rash.  Musculoskeletal: No acute joint abnormalities.  Psychiatric: Appropriate affect.  Neurologic: Alert and orientated without any focal neurological signs.  Labs on Admission:  Basic Metabolic Panel:  Recent Labs Lab 04/25/13 0922  NA 136  K 4.3  CL 101  CO2 24  GLUCOSE 117*  BUN 23  CREATININE 1.34  CALCIUM 9.0   Liver Function Tests:  Recent Labs Lab 04/25/13 0922  AST 88*  ALT 95*  ALKPHOS 138*  BILITOT 1.2  PROT 6.7  ALBUMIN 3.3*     CBC:  Recent Labs Lab 04/25/13 0922  WBC 9.1  HGB 13.7  HCT 39.3  MCV 95.9  PLT 217   Cardiac Enzymes:  Recent Labs Lab 04/25/13 0922  TROPONINI <0.30       Radiological Exams on Admission: Dg Chest Port 1 View  04/25/2013   *RADIOLOGY REPORT*  Clinical Data: Chest pain, shortness of breath, tachycardia, history of hypertension and COPD  PORTABLE CHEST - 1 VIEW  Comparison: None.  Findings: Normal cardiac silhouette and mediastinal contours.  The lungs appear hyperinflated with flattening of the bilateral hemidiaphragms.  There is mild diffuse slightly nodular thickening of the pulmonary interstitium.  No definite pleural effusion, though note, the right costophrenic angle is excluded from view. No definite evidence of edema.  No pneumothorax.  No definite acute osseous abnormality.  IMPRESSION: Hyperexpanded lungs and bronchitic change without definite acute cardiopulmonary disease on this AP portable examination.  Further evaluation with a PA and lateral chest radiograph may be obtained as clinically indicated.   Original Report Authenticated By: Jake Seats, MD    EKG:  Independently reviewed. Atrial flutter, no ischemic changes seen.  Assessment/Plan   1. Atrial flutter with rapid ventricle response. 2. COPD with wheezing/bronchospasm. 3. Hypertension. 4. Ongoing tobacco abuse.  Plan: 1. Admit to step down unit with Cardizem drip. 2. Serial cardiac enzymes. 3. Cardiology consultation. 4. Echocardiogram. 5. Intravenous steroids for COPD/bronchospasm. I do not think he warrants antibiotics at the present time. Further recommendations will depend on patient's hospital progress.   Code Status: Full code.   Family Communication: Discussed plan with patient at the bedside.   Disposition Plan: Home when medically stable.   Time spent: 45 minutes.  Doree Albee [facility] Pager [phone].  If 7PM-7AM, please contact night-coverage [email or web address] Password [facility] 04/25/2013, 11:45 AM

## 2013-04-25 NOTE — Progress Notes (Signed)
Pt started on Xarelto per MD order. Medication gone over with pt using teach back method. Handout provided. All questions and concerns answered.

## 2013-04-25 NOTE — Progress Notes (Addendum)
.   ANTICOAGULATION CONSULT NOTE - Initial Consult  Pharmacy Consult for Xarelto  (Rivaroxaban)  Indication: Atrial Fibrillation, Nonvalvular  Allergies  Allergen Reactions  . Bupropion Anxiety    Patient Measurements: Height: 5\' 9"  (175.3 cm) IBW/kg (Calculated) : 70.7   Vital Signs: Temp: 98.8 F (37.1 C) (06/13 1200) Temp src: Oral (06/13 1200) BP: 126/81 mmHg (06/13 1230) Pulse Rate: 108 (06/13 1230)  Labs:  Recent Labs  04/25/13 0922  HGB 13.7  HCT 39.3  PLT 217  CREATININE 1.34  TROPONINI <0.30    The CrCl is unknown because both a height and weight (above a minimum accepted value) are required for this calculation.   Medical History: Past Medical History  Diagnosis Date  . Hyperlipidemia   . Hypertension   . COPD (chronic obstructive pulmonary disease)   . Cancer     Medications:  Scheduled:  . sodium chloride   Intravenous STAT  . [START ON 04/26/2013] aspirin EC  81 mg Oral Daily  . atorvastatin  10 mg Oral q1800  . [START ON 04/26/2013] beta carotene w/minerals  1 tablet Oral Daily  . digoxin  0.25 mg Intravenous Once  . digoxin  0.5 mg Intravenous Once  . [START ON 04/26/2013] digoxin  0.25 mg Oral Daily  . [START ON 04/26/2013] DULoxetine  60 mg Oral Daily  . methylPREDNISolone (SOLU-MEDROL) injection  80 mg Intravenous Q12H  . [START ON 04/26/2013] multivitamin with minerals  1 tablet Oral Daily  . omega-3 acid ethyl esters  1 g Oral BID  . polyvinyl alcohol  1 drop Both Eyes BID  . rivaroxaban  15 mg Oral Q supper  . sodium chloride  3 mL Intravenous Q12H  . tamsulosin  0.4 mg Oral Daily    Assessment: New onset atrial fibrillation, non valvular SCR 1.34 Calculated CrCl 49.1 ml/min  Goal of Therapy:  Stroke/Systemic embolism prophylaxis Monitor platelets by anticoagulation protocol: Yes   Plan:  Xarelto 15 mg po daily with evening meal (CrCl < 50 ml/min) Monitor renal function Monitor for signs of bleeding Labs per  protocol  Abner Greenspan, Jaslyn Bansal Bennett 04/25/2013,2:21 PM

## 2013-04-25 NOTE — ED Notes (Signed)
Mid cp with sob and dizziness that started last night.  States pain is worse with deep breath.  C/o cough also.

## 2013-04-25 NOTE — Consult Note (Signed)
CARDIOLOGY CONSULT NOTE  Patient ID: Don [name] MRN: [id number] DOB/AGE: December 19, 1940 72 y.o.  Admit date: 04/25/2013 Referring Physician: PTH-Gosrani Primary PhysicianWONG,FRANCIS PATRICK, MD Primary Cardiologist: Einar Gip Reason for Consultation: New onset Atrial flutter with RVR Active Problems:   COPD (chronic obstructive pulmonary disease)   Hypertension   Atrial flutter with rapid ventricular response   Tobacco abuse  HPI:  Patient is a 72 yo who presented to ER today with SOB, chest tightness. fatigue. The patient says over the past few days he has developed chest tightness, SOB>  He says his heart rate has been elevated.  Giving out easlily  Has no energy  Mild dizziness  No syncope.  Not slept well he thinks because of poor breathing  He called his primary MD who told him to go to the emergency room THe patient says he has had infrequent episodes in past of chest tightness possible with heart racing  Last only minutes  Nothing like how he feels today.    EKG demonstrated atrial fibrillation/flutter with a RVR, 162 beats per minute he was given IV Cardizem bolus and began on a drip. He was not found to be anemic, troponin was not elevated.   chest x-ray demonstrated hyperexpansion of lungs and bronchitic changes without acute cardiopulmonary disease, pneumonia CHF or pulmonary edema. Patient says his breathing may be a little easier since he first got to ER>    He was followed by [facility]  in [place] in the past.for chest tightness, SOB , and states that he has had a stress test, echocardiogram, all found to be negative and he was released to his primary care physician. Request records.  Review of systems complete and found to be negative unless listed above   Denies bleeding  No fallls.  Past Medical History  Diagnosis Date  . Hyperlipidemia   . Hypertension   . COPD (chronic obstructive pulmonary disease)   . Cancer     Family History  Problem Relation Age of Onset  .  Diabetes Father   . Heart disease Father     MI    History   Social History  . Marital Status: Married    Spouse Name: N/A    Number of Children: N/A  . Years of Education: N/A   Occupational History  . Not on file.   Social History Main Topics  . Smoking status: Current Every Day Smoker    Types: Cigarettes, Cigars  . Smokeless tobacco: Not on file  . Alcohol Use: No  . Drug Use: No  . Sexually Active: Not on file   Other Topics Concern  . Not on file   Social History Narrative  . No narrative on file    Past Surgical History  Procedure Laterality Date  . 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 Anal abcess,hemorroids,       Prescriptions prior to admission  Medication Sig Dispense Refill  . amLODipine (NORVASC) 10 MG tablet Take 10 mg by mouth daily.      [name] aspirin EC 81 MG tablet Take 81 mg by mouth daily.      . beta carotene w/minerals (OCUVITE) tablet Take 1 tablet by mouth daily.      . budesonide-formoterol (SYMBICORT) 160-4.5 MCG/ACT inhaler Inhale 2 puffs into the lungs 2 (two) times daily as needed.        . clonazePAM (KLONOPIN) 0.5 MG tablet Take 0.5 mg by mouth 2 (two) times daily as needed for anxiety.       [name]  DULoxetine (CYMBALTA) 60 MG capsule       . fish oil-omega-3 fatty acids 1000 MG capsule Take 1 g by mouth 2 (two) times daily.      . fluorouracil (EFUDEX) 5 % cream Apply 1 application topically daily as needed.       . mometasone (ELOCON) 0.1 % cream Apply 1 application topically 2 (two) times daily as needed.       . Multiple Vitamin (MULTIVITAMIN WITH MINERALS) TABS Take 1 tablet by mouth daily.      Vladimir Faster Glycol-Propyl Glycol (SYSTANE OP) Place 1 drop into both eyes 2 (two) times daily.      . tamsulosin (FLOMAX) 0.4 MG CAPS Take 0.4 mg by mouth daily.      [name] tiotropium (SPIRIVA) 18 MCG inhalation capsule Place 18 mcg into inhaler and inhale as needed.        . zolpidem (AMBIEN) 10 MG tablet Take 10 mg by mouth at bedtime as needed for  sleep.      . pravastatin (PRAVACHOL) 40 MG tablet Take 40 mg by mouth daily.        Physical Exam: Blood pressure 118/88, pulse 69, temperature 98.6 F (37 C), temperature source Oral, resp. rate 17, SpO2 96.00%.   General: Well developed and in NAD Head:  Normal cephalic and atramatic  Lungs: Some decreased airflow.  Mild crackles at bases Heart  IRreg Iirregular S1 S2 No S3 No murmurs  Pulses are 2+ & equal.            No carotid bruit. No JVD.  No abdominal bruits. No femoral bruits. Abdomen: Bowel sounds are positive, abdomen soft and non-tender without masses or   Msk:  Moving all extremities. Extremities: No clubbing, cyanosis.  Tr edema.  DP +1 Neuro: Alert and oriented X 3. Psych:  Good affect, responds appropriately  Labs:   Lab Results  Component Value Date   WBC 9.1 04/25/2013   HGB 13.7 04/25/2013   HCT 39.3 04/25/2013   MCV 95.9 04/25/2013   PLT 217 04/25/2013    Recent Labs Lab 04/25/13 0922  NA 136  K 4.3  CL 101  CO2 24  BUN 23  CREATININE 1.34  CALCIUM 9.0  PROT 6.7  BILITOT 1.2  ALKPHOS 138*  ALT 95*  AST 88*  GLUCOSE 117*   Lab Results  Component Value Date   TROPONINI <0.30 04/25/2013       Radiology: Dg Chest Port 1 View  04/25/2013   *RADIOLOGY REPORT*  Clinical Data: Chest pain, shortness of breath, tachycardia, history of hypertension and COPD  PORTABLE CHEST - 1 VIEW  Comparison: None.  Findings: Normal cardiac silhouette and mediastinal contours.  The lungs appear hyperinflated with flattening of the bilateral hemidiaphragms.  There is mild diffuse slightly nodular thickening of the pulmonary interstitium.  No definite pleural effusion, though note, the right costophrenic angle is excluded from view. No definite evidence of edema.  No pneumothorax.  No definite acute osseous abnormality.  IMPRESSION: Hyperexpanded lungs and bronchitic change without definite acute cardiopulmonary disease on this AP portable examination.  Further evaluation  with a PA and lateral chest radiograph may be obtained as clinically indicated.   Original Report Authenticated By: Jake Seats, MD   [id number] flutter with variable block. 133 bpm.  ASSESSMENT AND PLAN:   1.Atrial flutter.  New.  Histroy suggests it has been goin on for several days.  May be longer.  Rates not controlled yet on IV  diltiazem  Would begin digoxin as well  May need to consider low dose metoprolol to get rate control Recomm echo to define LV function. Patient should be on anticoagulation. Would recomm Xarelto.   If remains tachycardic despite meds or symptomatic would recomm TEE cardioversion Monday (keep npo SUnday night).  May eventually need more (ablation) if recurs.  2.  Chest tightness:  May be related to 1  Agree with following troponins. 3. COPD:  He is being treated with intravenous steroids for this and also to assist with bronchospasm. The patient is to not taking his medications as directed.  4.  Increased LFTs  Follow.  Signed: Phill Myron. Purcell Nails NP [facility] 04/25/2013, 11:55 AM Co-Sign MD

## 2013-04-25 NOTE — ED Provider Notes (Signed)
History    This chart was scribed for Maudry Diego, MD by Malen Gauze, ED Scribe. The patient was seen in room APA07/APA07 and the patient's care was started at 9:25AM.    CSN: [id number]  Arrival date & time 04/25/13  0911   First MD Initiated Contact with Patient 04/25/13 [phone]      Chief Complaint  Patient presents with  . Chest Pain  . Shortness of Breath    (Consider location/radiation/quality/duration/timing/severity/associated sxs/prior treatment) Patient is a 72 y.o. male presenting with chest pain and shortness of breath. The history is provided by the patient. No language interpreter was used.  Chest Pain Pain location:  Substernal area Pain radiates to:  Does not radiate Pain radiates to the back: no   Pain severity:  Moderate Onset quality:  Gradual Duration:  9 hours Timing:  Constant Progression:  Worsening Chronicity:  New Context: breathing   Ineffective treatments:  None tried Associated symptoms: cough, dizziness, shortness of breath and weakness   Associated symptoms: no abdominal pain, no back pain, no fatigue and no headache   Shortness of Breath Associated symptoms: chest pain and cough   Associated symptoms: no abdominal pain, no headaches and no rash    HPI Comments: Don [name] is a 72 y.o. male who presents to the Emergency Department complaining of constant, moderate to severe chest pain with associated shortness of breath, cough and dizziness with an onset last night that is gradually getting progressively worse. He reports he has "felt bad" for the past 2 days but last night he started to have pain in his chest that is aggravated by deep inhalation. He also reports some generalized weakness as well. No other pertinent medical symptoms.  PCP: Dr Jacelyn Grip  Past Medical History  Diagnosis Date  . Hyperlipidemia   . Hypertension   . COPD (chronic obstructive pulmonary disease)   . Cancer     Past Surgical History  Procedure Laterality  Date  . 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 Anal abcess,hemorroids,      Family History  Problem Relation Age of Onset  . Diabetes Father   . Heart disease Father     MI    History  Substance Use Topics  . Smoking status: Current Every Day Smoker    Types: Cigarettes, Cigars  . Smokeless tobacco: Not on file  . Alcohol Use: No     Review of Systems  Constitutional: Negative for appetite change and fatigue.  HENT: Negative for congestion, sinus pressure and ear discharge.   Eyes: Negative for discharge.  Respiratory: Positive for cough and shortness of breath.   Cardiovascular: Positive for chest pain.  Gastrointestinal: Negative for abdominal pain and diarrhea.  Genitourinary: Negative for frequency and hematuria.  Musculoskeletal: Negative for back pain.  Skin: Negative for rash.  Neurological: Positive for dizziness and weakness. Negative for seizures and headaches.  Psychiatric/Behavioral: Negative for hallucinations.  All other systems reviewed and are negative.    Allergies  Bupropion  Home Medications   Current Outpatient Rx  Name  Route  Sig  Dispense  Refill  . acetaminophen (TYLENOL) 325 MG tablet   Oral   Take 650 mg by mouth as needed.           [name] amLODipine (NORVASC) 10 MG tablet      TAKE ONE TABLET BY MOUTH EVERY DAY   90 tablet   0   . budesonide-formoterol (SYMBICORT) 160-4.5 MCG/ACT inhaler   Inhalation   Inhale  2 puffs into the lungs 2 (two) times daily as needed.           . cephALEXin (KEFLEX) 500 MG capsule   Oral   Take 1 capsule (500 mg total) by mouth 2 (two) times daily.   20 capsule   0   . clonazePAM (KLONOPIN) 0.5 MG tablet               . DULoxetine (CYMBALTA) 60 MG capsule               . fluorouracil (EFUDEX) 5 % cream               . mometasone (ELOCON) 0.1 % cream               . pravastatin (PRAVACHOL) 40 MG tablet   Oral   Take 40 mg by mouth daily.         [name] tiotropium (SPIRIVA)  18 MCG inhalation capsule   Inhalation   Place 18 mcg into inhaler and inhale as needed.           [name] EXPIRED: zolpidem (AMBIEN) 10 MG tablet   Oral   Take 1 tablet (10 mg total) by mouth at bedtime as needed for sleep.   30 tablet   0     BP 138/102  Pulse 167  Temp(Src) 98.6 F (37 C) (Oral)  Resp 24  SpO2 97%  Physical Exam  Nursing note and vitals reviewed. Constitutional: He is oriented to person, place, and time. He appears well-developed.  HENT:  Head: Normocephalic.  Eyes: Conjunctivae and EOM are normal. No scleral icterus.  Neck: Neck supple. No thyromegaly present.  Cardiovascular: An irregular rhythm present. Tachycardia present.  Exam reveals no gallop and no friction rub.   No murmur heard. Pulmonary/Chest: Effort normal. No stridor. He has no wheezes. He has no rales. He exhibits tenderness.  Abdominal: Soft. He exhibits no distension. There is no tenderness. There is no rebound.  Musculoskeletal: Normal range of motion. He exhibits no edema.  Lymphadenopathy:    He has no cervical adenopathy.  Neurological: He is alert and oriented to person, place, and time. Coordination normal.  Skin: No rash noted. No erythema.  Psychiatric: He has a normal mood and affect. His behavior is normal.    ED Course  Procedures (including critical care time)  COORDINATION OF CARE:  9:30AM - cardizem, CXR, LFTs, CBC, BMP, troponin, and EKG will be ordered for [name].    Date: 04/25/2013  Rate:162  Rhythm: atrial fibrillation  QRS Axis: normal  Intervals: normal  ST/T Wave abnormalities: nonspecific ST changes  Conduction Disutrbances:none  Narrative Interpretation:   Old EKG Reviewed: changes noted     Labs Reviewed  CBC  BASIC METABOLIC PANEL  TROPONIN I   No results found.   No diagnosis found.   CRITICAL CARE Performed by: Arrin Ishler L Total critical care time:45 Critical care time was exclusive of separately billable procedures and  treating other patients. Critical care was necessary to treat or prevent imminent or life-threatening deterioration. Critical care was time spent personally by me on the following activities: development of treatment plan with patient and/or surrogate as well as nursing, discussions with consultants, evaluation of patient's response to treatment, examination of patient, obtaining history from patient or surrogate, ordering and performing treatments and interventions, ordering and review of laboratory studies, ordering and review of radiographic studies, pulse oximetry and re-evaluation of patient's condition.  MDM  The chart was scribed for me under my direct supervision.  I personally performed the history, physical, and medical decision making and all procedures in the evaluation of this patient.Maudry Diego, MD 04/25/13 1020

## 2013-04-26 DIAGNOSIS — I4891 Unspecified atrial fibrillation: Secondary | ICD-10-CM

## 2013-04-26 LAB — CBC
HCT: 35.4 % — ABNORMAL LOW (ref 39.0–52.0)
Hemoglobin: 12.2 g/dL — ABNORMAL LOW (ref 13.0–17.0)
MCH: 32.8 pg (ref 26.0–34.0)
MCV: 95.2 fL (ref 78.0–100.0)
RBC: 3.72 MIL/uL — ABNORMAL LOW (ref 4.22–5.81)

## 2013-04-26 LAB — COMPREHENSIVE METABOLIC PANEL
AST: 114 U/L — ABNORMAL HIGH (ref 0–37)
Albumin: 2.6 g/dL — ABNORMAL LOW (ref 3.5–5.2)
Calcium: 8.3 mg/dL — ABNORMAL LOW (ref 8.4–10.5)
Chloride: 100 mEq/L (ref 96–112)
Creatinine, Ser: 1.26 mg/dL (ref 0.50–1.35)
Total Bilirubin: 0.7 mg/dL (ref 0.3–1.2)
Total Protein: 5.7 g/dL — ABNORMAL LOW (ref 6.0–8.3)

## 2013-04-26 MED ORDER — PREDNISONE 20 MG PO TABS
40.0000 mg | ORAL_TABLET | Freq: Every day | ORAL | Status: DC
  Administered 2013-04-26: 40 mg via ORAL
  Filled 2013-04-26 (×2): qty 1

## 2013-04-26 MED ORDER — TIOTROPIUM BROMIDE MONOHYDRATE 18 MCG IN CAPS
18.0000 ug | ORAL_CAPSULE | Freq: Every day | RESPIRATORY_TRACT | Status: DC
  Administered 2013-04-26 – 2013-04-28 (×3): 18 ug via RESPIRATORY_TRACT
  Filled 2013-04-26: qty 5

## 2013-04-26 MED ORDER — BUDESONIDE-FORMOTEROL FUMARATE 160-4.5 MCG/ACT IN AERO
2.0000 | INHALATION_SPRAY | Freq: Two times a day (BID) | RESPIRATORY_TRACT | Status: DC
Start: 2013-04-26 — End: 2013-04-28
  Administered 2013-04-26 – 2013-04-28 (×4): 2 via RESPIRATORY_TRACT
  Filled 2013-04-26: qty 6

## 2013-04-26 MED ORDER — RIVAROXABAN 10 MG PO TABS
20.0000 mg | ORAL_TABLET | Freq: Every day | ORAL | Status: DC
  Administered 2013-04-26 – 2013-04-27 (×2): 20 mg via ORAL
  Filled 2013-04-26 (×2): qty 2

## 2013-04-26 MED ORDER — DILTIAZEM HCL ER COATED BEADS 120 MG PO CP24
120.0000 mg | ORAL_CAPSULE | Freq: Every day | ORAL | Status: DC
  Administered 2013-04-26: 120 mg via ORAL
  Filled 2013-04-26: qty 1

## 2013-04-26 NOTE — Progress Notes (Signed)
.   [place] for Xarelto  (Rivaroxaban)  Indication: Atrial Fibrillation, Nonvalvular  Allergies  Allergen Reactions  . Bupropion Anxiety    Patient Measurements: Height: 5\' 9"  (175.3 cm) Weight: 170 lb (77.111 kg) IBW/kg (Calculated) : 70.7   Vital Signs: Temp: 97.8 F (36.6 C) (06/14 0800) Temp src: Oral (06/14 0800) BP: 122/72 mmHg (06/14 0800) Pulse Rate: 82 (06/14 0800)  Labs:  Recent Labs  04/25/13 0922 04/25/13 1559 04/25/13 2115 04/26/13 0532  HGB 13.7  --   --  12.2*  HCT 39.3  --   --  35.4*  PLT 217  --   --  203  CREATININE 1.34  --   --  1.26  TROPONINI <0.30 <0.30 <0.30  --     Estimated Creatinine Clearance: 52.2 ml/min (by C-G formula based on Cr of 1.26).   Medical History: Past Medical History  Diagnosis Date  . Hyperlipidemia   . Hypertension   . COPD (chronic obstructive pulmonary disease)   . Cancer     Medications:  Scheduled:  . aspirin EC  81 mg Oral Daily  . atorvastatin  10 mg Oral q1800  . beta carotene w/minerals  1 tablet Oral Daily  . budesonide-formoterol  2 puff Inhalation BID  . digoxin  0.25 mg Oral Daily  . diltiazem  120 mg Oral Daily  . DULoxetine  60 mg Oral Daily  . multivitamin with minerals  1 tablet Oral Daily  . omega-3 acid ethyl esters  1 g Oral BID  . polyvinyl alcohol  1 drop Both Eyes BID  . predniSONE  40 mg Oral Q breakfast  . rivaroxaban  15 mg Oral Q supper  . sodium chloride  3 mL Intravenous Q12H  . tamsulosin  0.4 mg Oral Daily  . tiotropium  18 mcg Inhalation Daily  . [DISCONTINUED] sodium chloride   Intravenous STAT    Assessment: New onset atrial fibrillation, non valvular SCR 1.26 today Calculated CrCl 52.2 ml/min Renal function has improved  RN noted Xarelto patient education   Goal of Therapy:  Stroke/Systemic embolism prophylaxis Monitor platelets by anticoagulation protocol: Yes   Plan:  Change Xarelto to 20 mg po daily with evening meal  (CrCl > 50 ml/min) Monitor renal function Monitor for signs of bleeding Labs per protocol  Abner Greenspan, Chaelyn Bunyan Bennett 04/26/2013,9:31 AM

## 2013-04-26 NOTE — Progress Notes (Signed)
Don [name] [id number] DOB: [birth year] DOA: 04/25/2013 PCP: Anthoney Harada, MD   Subjective: This man is improved. His ventricular rate is better controlled on digoxin and diltiazem drip. Appreciate cardiology input. He has been started on anticoagulation with xeralto.           Physical Exam: Blood pressure 98/55, pulse 81, temperature 97.7 F (36.5 C), temperature source Oral, resp. rate 15, height 5\' 9"  (1.753 m), weight 77.111 kg (170 lb), SpO2 97.00%. He looks systemically well. Lung fields are clear. Ventricular rate is controlled and still in atrial flutter. He is alert and oriented.   Investigations:  Recent Results (from the past 240 hour(s))  MRSA PCR SCREENING     Status: None   Collection Time    04/25/13 12:14 PM      Result Value Range Status   MRSA by PCR NEGATIVE  NEGATIVE Final   Comment:            The GeneXpert MRSA Assay (FDA     approved for NASAL specimens     only), is one component of a     comprehensive MRSA colonization     surveillance program. It is not     intended to diagnose MRSA     infection nor to guide or     monitor treatment for     MRSA infections.     Basic Metabolic Panel:  Recent Labs  04/25/13 0922 04/26/13 0532  NA 136 133*  K 4.3 4.6  CL 101 100  CO2 24 23  GLUCOSE 117* 200*  BUN 23 28*  CREATININE 1.34 1.26  CALCIUM 9.0 8.3*   Liver Function Tests:  Recent Labs  04/25/13 0922 04/26/13 0532  AST 88* 114*  ALT 95* 148*  ALKPHOS 138* 126*  BILITOT 1.2 0.7  PROT 6.7 5.7*  ALBUMIN 3.3* 2.6*     CBC:  Recent Labs  04/25/13 0922 04/26/13 0532  WBC 9.1 10.2  HGB 13.7 12.2*  HCT 39.3 35.4*  MCV 95.9 95.2  PLT 217 203    Dg Chest Port 1 View  04/25/2013   *RADIOLOGY REPORT*  Clinical Data: Chest pain, shortness of breath, tachycardia, history of hypertension and COPD  PORTABLE CHEST - 1 VIEW  Comparison: None.  Findings: Normal cardiac silhouette and mediastinal contours.  The lungs  appear hyperinflated with flattening of the bilateral hemidiaphragms.  There is mild diffuse slightly nodular thickening of the pulmonary interstitium.  No definite pleural effusion, though note, the right costophrenic angle is excluded from view. No definite evidence of edema.  No pneumothorax.  No definite acute osseous abnormality.  IMPRESSION: Hyperexpanded lungs and bronchitic change without definite acute cardiopulmonary disease on this AP portable examination.  Further evaluation with a PA and lateral chest radiograph may be obtained as clinically indicated.   Original Report Authenticated By: Jake Seats, MD      Medications: I have reviewed the patient's current medications.  Impression: 1. Atrial flutter with rapid ventricular response, now rate controlled. Echocardiogram normal. 2. COPD with bronchospasm, improving. 3. Hypertension. 4. Tobacco abuse.     Plan: 1. Discontinue IV Cardizem drip. Continue with digoxin and oral Cardizem. 2. Discontinue IV steroids and start oral prednisone. 3. If patient does not cardiovert sinus rhythm, cardiology is likely to treat him with DC cardioversion  Consultants:  [facility] cardiology, Dr. Harrington Challenger.   Procedures:  Echocardiogram: Study Conclusions  Left ventricle: The cavity size was normal. Wall thickness was increased in a pattern  of moderate LVH. Systolic function was normal. The estimated ejection fraction was in the range of 55% to 60%. Transthoracic echocardiography. M-mode, complete 2D, spectral Doppler, and color Doppler. Height: Height: 175.3cm. Height: 69in. Weight: Weight: 74.4kg. Weight: 163.7lb. Body mass index: BMI: 24.2kg/m^2. Body surface area: BSA: 1.26m^2. Patient status: Inpatient. Location: ICU/CCU   Antibiotics:  None.                   Code Status: Full code.  Family Communication: Discussed plan with patient at the bedside.   Disposition Plan: Home when medically stable.  Time spent: 20  minutes.   LOS: 1 day   Doree Albee Pager [phone]  04/26/2013, 7:48 AM

## 2013-04-27 ENCOUNTER — Inpatient Hospital Stay (HOSPITAL_COMMUNITY): Payer: Medicare Other

## 2013-04-27 LAB — CBC
Hemoglobin: 12.7 g/dL — ABNORMAL LOW (ref 13.0–17.0)
Platelets: 242 10*3/uL (ref 150–400)
RBC: 3.79 MIL/uL — ABNORMAL LOW (ref 4.22–5.81)

## 2013-04-27 LAB — COMPREHENSIVE METABOLIC PANEL
BUN: 31 mg/dL — ABNORMAL HIGH (ref 6–23)
CO2: 23 mEq/L (ref 19–32)
Calcium: 8.7 mg/dL (ref 8.4–10.5)
Creatinine, Ser: 1.32 mg/dL (ref 0.50–1.35)
GFR calc Af Amer: 60 mL/min — ABNORMAL LOW (ref >90)
GFR calc non Af Amer: 52 mL/min — ABNORMAL LOW (ref >90)
Glucose, Bld: 155 mg/dL — ABNORMAL HIGH (ref 70–99)
Total Bilirubin: 0.3 mg/dL (ref 0.3–1.2)

## 2013-04-27 MED ORDER — DILTIAZEM HCL ER COATED BEADS 240 MG PO CP24
240.0000 mg | ORAL_CAPSULE | Freq: Every day | ORAL | Status: DC
  Administered 2013-04-27 – 2013-04-28 (×2): 240 mg via ORAL
  Filled 2013-04-27 (×2): qty 1

## 2013-04-27 MED ORDER — PREDNISONE 20 MG PO TABS
20.0000 mg | ORAL_TABLET | Freq: Every day | ORAL | Status: DC
  Administered 2013-04-27 – 2013-04-28 (×2): 20 mg via ORAL
  Filled 2013-04-27: qty 1

## 2013-04-27 NOTE — Progress Notes (Addendum)
Don [name] [id number] DOB: [birth year] DOA: 04/25/2013 PCP: Anthoney Harada, MD   Subjective: This man feels well. He does not describe palpitations or dyspnea. He now appears to have transitioned into atrial fibrillation from atrial flutter. His ventricular rate is still 80-90.           Physical Exam: Blood pressure 104/62, pulse 83, temperature 97.8 F (36.6 C), temperature source Oral, resp. rate 14, height 5\' 9"  (1.753 m), weight 77.111 kg (170 lb), SpO2 99.00%. He looks systemically well. Lung fields are clear. Ventricular rate is controlled and still in atrial fibrillation. He is alert and oriented.   Investigations:  Recent Results (from the past 240 hour(s))  MRSA PCR SCREENING     Status: None   Collection Time    04/25/13 12:14 PM      Result Value Range Status   MRSA by PCR NEGATIVE  NEGATIVE Final   Comment:            The GeneXpert MRSA Assay (FDA     approved for NASAL specimens     only), is one component of a     comprehensive MRSA colonization     surveillance program. It is not     intended to diagnose MRSA     infection nor to guide or     monitor treatment for     MRSA infections.     Basic Metabolic Panel:  Recent Labs  04/26/13 0532 04/27/13 0441  NA 133* 136  K 4.6 4.7  CL 100 102  CO2 23 23  GLUCOSE 200* 155*  BUN 28* 31*  CREATININE 1.26 1.32  CALCIUM 8.3* 8.7   Liver Function Tests:  Recent Labs  04/26/13 0532 04/27/13 0441  AST 114* 57*  ALT 148* 133*  ALKPHOS 126* 126*  BILITOT 0.7 0.3  PROT 5.7* 6.0  ALBUMIN 2.6* 2.7*     CBC:  Recent Labs  04/26/13 0532 04/27/13 0441  WBC 10.2 15.8*  HGB 12.2* 12.7*  HCT 35.4* 35.4*  MCV 95.2 93.4  PLT 203 242    Dg Chest Port 1 View  04/25/2013   *RADIOLOGY REPORT*  Clinical Data: Chest pain, shortness of breath, tachycardia, history of hypertension and COPD  PORTABLE CHEST - 1 VIEW  Comparison: None.  Findings: Normal cardiac silhouette and mediastinal  contours.  The lungs appear hyperinflated with flattening of the bilateral hemidiaphragms.  There is mild diffuse slightly nodular thickening of the pulmonary interstitium.  No definite pleural effusion, though note, the right costophrenic angle is excluded from view. No definite evidence of edema.  No pneumothorax.  No definite acute osseous abnormality.  IMPRESSION: Hyperexpanded lungs and bronchitic change without definite acute cardiopulmonary disease on this AP portable examination.  Further evaluation with a PA and lateral chest radiograph may be obtained as clinically indicated.   Original Report Authenticated By: Jake Seats, MD      Medications: I have reviewed the patient's current medications.  Impression: 1. Atrial fibrillation with rapid ventricular response, now rate better controlled. Echocardiogram normal. Anticoagulated with xeralto. 2. COPD with bronchospasm, improving. 3. Hypertension. 4. Tobacco abuse. 5. Abnormal liver enzyme elevation.     Plan: 1. Increase Cardizem CD to 240 mg daily. Reduce prednisone to 20 mg daily. 2. Ultrasound of the right upper quadrant to make sure there are no liver abnormalities. 3. Review by cardiology tomorrow, probable cardioversion.  Consultants:  [facility], Dr. Harrington Challenger.   Procedures:  Echocardiogram: Study Conclusions  Left ventricle:  The cavity size was normal. Wall thickness was increased in a pattern of moderate LVH. Systolic function was normal. The estimated ejection fraction was in the range of 55% to 60%. Transthoracic echocardiography. M-mode, complete 2D, spectral Doppler, and color Doppler. Height: Height: 175.3cm. Height: 69in. Weight: Weight: 74.4kg. Weight: 163.7lb. Body mass index: BMI: 24.2kg/m^2. Body surface area: BSA: 1.40m^2. Patient status: Inpatient. Location: ICU/CCU   Antibiotics:  None.                   Code Status: Full code.  Family Communication: Discussed plan with patient at the  bedside.   Disposition Plan: Home when medically stable.  Time spent: 20 minutes.   LOS: 2 days   Doree Albee Pager [phone]  04/27/2013, 7:31 AM

## 2013-04-28 LAB — COMPREHENSIVE METABOLIC PANEL
ALT: 91 U/L — ABNORMAL HIGH (ref 0–53)
AST: 29 U/L (ref 0–37)
Alkaline Phosphatase: 104 U/L (ref 39–117)
CO2: 26 mEq/L (ref 19–32)
GFR calc Af Amer: 60 mL/min — ABNORMAL LOW (ref >90)
GFR calc non Af Amer: 51 mL/min — ABNORMAL LOW (ref >90)
Glucose, Bld: 124 mg/dL — ABNORMAL HIGH (ref 70–99)
Potassium: 4.4 mEq/L (ref 3.5–5.1)
Sodium: 136 mEq/L (ref 135–145)
Total Protein: 5.3 g/dL — ABNORMAL LOW (ref 6.0–8.3)

## 2013-04-28 LAB — CBC
Hemoglobin: 12.5 g/dL — ABNORMAL LOW (ref 13.0–17.0)
MCH: 33.2 pg (ref 26.0–34.0)
MCHC: 34.4 g/dL (ref 30.0–36.0)
MCV: 96.3 fL (ref 78.0–100.0)
RBC: 3.77 MIL/uL — ABNORMAL LOW (ref 4.22–5.81)

## 2013-04-28 LAB — PROTIME-INR: Prothrombin Time: 17.1 seconds — ABNORMAL HIGH (ref 11.6–15.2)

## 2013-04-28 MED ORDER — DIGOXIN 250 MCG PO TABS: 0.2500 mg | ORAL_TABLET | Freq: Every day | ORAL | Status: DC

## 2013-04-28 MED ORDER — DILTIAZEM HCL ER COATED BEADS 240 MG PO CP24: 240.0000 mg | ORAL_CAPSULE | Freq: Every day | ORAL | Status: DC

## 2013-04-28 MED ORDER — RIVAROXABAN 20 MG PO TABS: 20.0000 mg | ORAL_TABLET | Freq: Every day | ORAL | Status: DC

## 2013-04-28 NOTE — Discharge Summary (Signed)
Physician Discharge Summary  Don [name] [id number] DOB: 1940/11/25 DOA: 04/25/2013  PCP: Anthoney Harada, MD  Admit date: 04/25/2013 Discharge date: 04/28/2013  Time spent: Greater than 30 minutes  Recommendations for Outpatient Follow-up:  1. Follow with Dr. Lattie Haw, cardiology in 2 weeks.   Discharge Diagnoses:  1. Atrial flutter/fibrillation with rapid ventricular response, rate controlled now. On chronic anticoagulation with xeralto. Consider DC  cardioversion in the future. 2. COPD. 3. Hypertension. 4. Tobacco abuse.   Discharge Condition: Stable.  Diet recommendation: Heart healthy.  Filed Weights   04/25/13 1200 04/28/13 0500  Weight: 77.111 kg (170 lb) 77.8 kg (171 lb 8.3 oz)    History of present illness:  This 72 year old man presented to the hospital with symptoms of chest pain, palpitations or dyspnea. Please see initial history as outlined below: HPI: Don [name] is a 72 y.o. male who presents to the hospital with a 2 to three-day history of headaches, chest pain, palpitations and dyspnea associated with a cough productive of white sputum. He has had no fever. He does not have a history of coronary artery disease but he does have a history of COPD. He continues to smoke cigarettes. He has been feeling somewhat lightheaded and dizzy with the palpitations. When he presented to the emergency room, he was found to be in atrial flutter with rapid ventricular response around 160. He has been started on a Cardizem drip and his ventricular rate is much better controlled now. He cannot be very specific about the type of chest pain he was having. His initial cardiac enzyme is negative. He is now being referred for remission.  Hospital Course:  The patient was admitted to the step down unit with Cardizem drip and his ventricular rate eventually was controlled well. He was started on xeralto, for anticoagulation by cardiology, Dr. Harrington Challenger. He has been stable in the last  couple of days. He was seen by Dr. Lattie Haw, cardiology today and the decision is for him to be discharged home and consider cardioversion later in the future. He is now stable for discharge  Procedures:  Echocardiogram: ------------------------------------------------------------ Study Conclusions  Left ventricle: The cavity size was normal. Wall thickness was increased in a pattern of moderate LVH. Systolic function was normal. The estimated ejection fraction was in the range of 55% to 60%. Transthoracic echocardiography. M-mode, complete 2D, spectral Doppler, and color Doppler. Height: Height: 175.3cm. Height: 69in. Weight: Weight: 74.4kg. Weight: 163.7lb. Body mass index: BMI: 24.2kg/m^2. Body surface area: BSA: 1.27m^2. Patient status: Inpatient. Location: ICU/CCU  Consultations:  Cardiology, Dr. Lattie Haw.  Discharge Exam: Filed Vitals:   04/28/13 0600 04/28/13 0709 04/28/13 0736 04/28/13 1002  BP:    134/81  Pulse:    93  Temp:   98.2 F (36.8 C)   TempSrc:   Oral   Resp: 14     Height:      Weight:      SpO2:  96%      General: He looks systemically well. Cardiovascular: Heart sounds are present and irregular, consistent with atrial fibrillation. Respiratory: Lung fields are clinically clear. He is alert and orientated  Discharge Instructions  Discharge Orders   Future Orders Complete By Expires     Diet - low sodium heart healthy  As directed     Increase activity slowly  As directed         Medication List    STOP taking these medications       amLODipine 10 MG tablet  Commonly  known as:  NORVASC     aspirin EC 81 MG tablet      TAKE these medications       beta carotene w/minerals tablet  Take 1 tablet by mouth daily.     budesonide-formoterol 160-4.5 MCG/ACT inhaler  Commonly known as:  SYMBICORT  Inhale 2 puffs into the lungs 2 (two) times daily as needed.     clonazePAM 0.5 MG tablet  Commonly known as:  KLONOPIN  Take 0.5 mg by mouth  2 (two) times daily as needed for anxiety.     digoxin 0.25 MG tablet  Commonly known as:  LANOXIN  Take 1 tablet (0.25 mg total) by mouth daily.     diltiazem 240 MG 24 hr capsule  Commonly known as:  CARDIZEM CD  Take 1 capsule (240 mg total) by mouth daily.     DULoxetine 60 MG capsule  Commonly known as:  CYMBALTA     fish oil-omega-3 fatty acids 1000 MG capsule  Take 1 g by mouth 2 (two) times daily.     fluorouracil 5 % cream  Commonly known as:  EFUDEX  Apply 1 application topically daily as needed.     mometasone 0.1 % cream  Commonly known as:  ELOCON  Apply 1 application topically 2 (two) times daily as needed.     multivitamin with minerals Tabs  Take 1 tablet by mouth daily.     pravastatin 40 MG tablet  Commonly known as:  PRAVACHOL  Take 40 mg by mouth daily.     Rivaroxaban 20 MG Tabs  Commonly known as:  XARELTO  Take 1 tablet (20 mg total) by mouth daily with supper.     SYSTANE OP  Place 1 drop into both eyes 2 (two) times daily.     tamsulosin 0.4 MG Caps  Commonly known as:  FLOMAX  Take 0.4 mg by mouth daily.     tiotropium 18 MCG inhalation capsule  Commonly known as:  SPIRIVA  Place 18 mcg into inhaler and inhale as needed.     zolpidem 10 MG tablet  Commonly known as:  AMBIEN  Take 10 mg by mouth at bedtime as needed for sleep.       Allergies  Allergen Reactions  . Bupropion Anxiety       Follow-up Information   Follow up with Jacqulyn Ducking, MD. Schedule an appointment as soon as possible for a visit in 2 weeks.   Contact information:   [address] [place] [address] [phone]        The results of significant diagnostics from this hospitalization (including imaging, microbiology, ancillary and laboratory) are listed below for reference.    Significant Diagnostic Studies: Dg Chest Port 1 View  04/25/2013   *RADIOLOGY REPORT*  Clinical Data: Chest pain, shortness of breath, tachycardia, history of  hypertension and COPD  PORTABLE CHEST - 1 VIEW  Comparison: None.  Findings: Normal cardiac silhouette and mediastinal contours.  The lungs appear hyperinflated with flattening of the bilateral hemidiaphragms.  There is mild diffuse slightly nodular thickening of the pulmonary interstitium.  No definite pleural effusion, though note, the right costophrenic angle is excluded from view. No definite evidence of edema.  No pneumothorax.  No definite acute osseous abnormality.  IMPRESSION: Hyperexpanded lungs and bronchitic change without definite acute cardiopulmonary disease on this AP portable examination.  Further evaluation with a PA and lateral chest radiograph may be obtained as clinically indicated.   Original Report Authenticated  By: Jake Seats, MD   US Abdomen Limited Ruq  04/27/2013   *RADIOLOGY REPORT*  Clinical Data: Elevated LFTs.  LIMITED ABDOMINAL ULTRASOUND  Comparison:  None.  Findings:  Gallbladder:  1.3 cm non mobile gallstone in the region of the gallbladder neck.  No gallbladder wall thickening or pericholecystic fluid.  Per ultrasound technologist, the patient was not tender over this region during scanning.  Common bile duct:  Proximally 5.5 mm.  Mid to distal aspect not visualized secondary to bowel gas.  Liver: No focal hepatic lesion or intrahepatic biliary duct dilatation.  IMPRESSION: 1.3 cm non mobile gallstone in the region of the gallbladder neck. No gallbladder wall thickening or pericholecystic fluid.  Per ultrasound technologist, the patient was not tender over this region during scanning.  Please see above.   Original Report Authenticated By: Genia Del, M.D.    Microbiology: Recent Results (from the past 240 hour(s))  MRSA PCR SCREENING     Status: None   Collection Time    04/25/13 12:14 PM      Result Value Range Status   MRSA by PCR NEGATIVE  NEGATIVE Final   Comment:            The GeneXpert MRSA Assay (FDA     approved for NASAL specimens     only), is one  component of a     comprehensive MRSA colonization     surveillance program. It is not     intended to diagnose MRSA     infection nor to guide or     monitor treatment for     MRSA infections.     Labs: Basic Metabolic Panel:  Recent Labs Lab 04/25/13 0922 04/26/13 0532 04/27/13 0441 04/28/13 0440  NA 136 133* 136 136  K 4.3 4.6 4.7 4.4  CL 101 100 102 102  CO2 24 23 23 26   GLUCOSE 117* 200* 155* 124*  BUN 23 28* 31* 30*  CREATININE 1.34 1.26 1.32 1.33  CALCIUM 9.0 8.3* 8.7 8.4   Liver Function Tests:  Recent Labs Lab 04/25/13 0922 04/26/13 0532 04/27/13 0441 04/28/13 0440  AST 88* 114* 57* 29  ALT 95* 148* 133* 91*  ALKPHOS 138* 126* 126* 104  BILITOT 1.2 0.7 0.3 0.2*  PROT 6.7 5.7* 6.0 5.3*  ALBUMIN 3.3* 2.6* 2.7* 2.5*     CBC:  Recent Labs Lab 04/25/13 0922 04/26/13 0532 04/27/13 0441 04/28/13 0440  WBC 9.1 10.2 15.8* 9.4  HGB 13.7 12.2* 12.7* 12.5*  HCT 39.3 35.4* 35.4* 36.3*  MCV 95.9 95.2 93.4 96.3  PLT 217 203 242 236   Cardiac Enzymes:  Recent Labs Lab 04/25/13 0922 04/25/13 1559 04/25/13 2115  TROPONINI <0.30 <0.30 <0.30         Signed:  Hurshel Party C  [facility] 04/28/2013, 1:04 PM

## 2013-04-28 NOTE — Progress Notes (Signed)
Don [name]  72 y.o.  male  Subjective: Doing generally well as; dyspnea improved. No history of cardiac irregularity or any cardiac problem in the past although he has experienced palpitations at times. He has not yet discontinued cigarette smoking, but indicates that he hopes to.  Allergy: Bupropion  Objective: Vital signs in last 24 hours: Temp:  [97.5 F (36.4 C)-98.2 F (36.8 C)] 98.2 F (36.8 C) (06/16 0736) Pulse Rate:  [86-96] 93 (06/16 1002) Resp:  [13-21] 14 (06/16 0600) BP: (115-140)/(49-109) 134/81 mmHg (06/16 1002) SpO2:  [95 %-96 %] 96 % (06/16 0709) Weight:  [77.8 kg (171 lb 8.3 oz)] 77.8 kg (171 lb 8.3 oz) (06/16 0500)  77.8 kg (171 lb 8.3 oz) Body mass index is 25.32 kg/(m^2).  Weight change:  Last BM Date: 04/27/13  Intake/Output from previous day: 06/15 0701 - 06/16 0700 In: 1680 [P.O.:1680] Out: -  Total I/O since admission:  +5 L  General- Well developed; no acute distress Neck- No JVD, no carotid bruits Lungs- clear lung fields; mild prolongation of the expiratory phase Cardiovascular- normal PMI; normal S1 and S2; irregular rhythm Abdomen- normal bowel sounds; soft and non-tender without masses or organomegaly Skin- Warm, no significant lesions Extremities- Nl distal pulses; no edema  Lab Results: Cardiac Markers:   Recent Labs  04/25/13 1559 04/25/13 2115  TROPONINI <0.30 <0.30   CBC:   Recent Labs  04/27/13 0441 04/28/13 0440  WBC 15.8* 9.4  HGB 12.7* 12.5*  HCT 35.4* 36.3*  PLT 242 236   BMET:  Recent Labs  04/27/13 0441 04/28/13 0440  NA 136 136  K 4.7 4.4  CL 102 102  CO2 23 26  GLUCOSE 155* 124*  BUN 31* 30*  CREATININE 1.32 1.33  CALCIUM 8.7 8.4   Hepatic Function:   Recent Labs  04/28/13 0440  PROT 5.3*  ALBUMIN 2.5*  AST 29  ALT 91*  ALKPHOS 104  BILITOT 0.2*   TSH-1.3  GFR:  Estimated Creatinine Clearance: 49.5 ml/min (by C-G formula based on Cr of 1.33). Lipids:  Lipid Panel  No results found for  this basename: chol, trig, hdl, cholhdl, vldl, ldlcalc   Echocardiogram: 04/25/2013-nl EF; moderate LVH; no significant valvular abnormalities; nl LA size.  EKG: 04/28/2013-atrial flutter with predominantly 2:1 AV block; ventricular rate-162; otherwise within normal limits.  Imaging:  CXR-COPD; no cardiomegaly nor CHF. Imaging results have been reviewed  Medications:  I have reviewed the patient's current medications. Scheduled: . aspirin EC  81 mg Oral Daily  . atorvastatin  10 mg Oral q1800  . beta carotene w/minerals  1 tablet Oral Daily  . budesonide-formoterol  2 puff Inhalation BID  . digoxin  0.25 mg Oral Daily  . diltiazem  240 mg Oral Daily  . DULoxetine  60 mg Oral Daily  . multivitamin with minerals  1 tablet Oral Daily  . omega-3 acid ethyl esters  1 g Oral BID  . polyvinyl alcohol  1 drop Both Eyes BID  . predniSONE  20 mg Oral Q breakfast  . rivaroxaban  20 mg Oral Q supper  . sodium chloride  3 mL Intravenous Q12H  . tamsulosin  0.4 mg Oral Daily  . tiotropium  18 mcg Inhalation Daily   Assessment/Plan: Atrial flutter: No definite symptoms at present related to arrhythmia; heart rate control is adequate.  Appears  ready for discharge. Cardioversion can be undertaken in a few weeks if atrial arrhythmia persists.  Anticoagulation: Tolerating rivaroxaban; PT, PTT pending to  verify anticoagulant effect. Aspirin can be discontinued.  Baseline CBC normal. Patient reports minor rectal bleeding in the past, which was considered insignificant and related to treatment for prostate cancer by patient's urologist.  Hypertension: Minor intermittent elevations since admission. With adjustment of diltiazem dosage, adequate antihypertensive affect appears to have been achieved.  Jacqulyn Ducking 04/28/2013, 10:49 AM

## 2013-04-28 NOTE — Progress Notes (Signed)
Patient given discharge instructions and patient's follow up appointment made with [facility]. Patient being discharged home. Patient verbalizes understanding of all discharge teaching. Patient alert, oriented and in stable condition at the time of discharge. Patient driving himself home. All IV's removed and IV sites WNL.

## 2013-04-28 NOTE — Progress Notes (Signed)
UR chart review completed.  

## 2013-04-28 NOTE — Progress Notes (Signed)
Don [name] [id number] DOB: September 10, 1941 DOA: 04/25/2013 PCP: Anthoney Harada, MD   Subjective: This man feels well. He does not describe palpitations or dyspnea. He now appears to be back in atrial flutter.           Physical Exam: Blood pressure 130/72, pulse 96, temperature 98.2 F (36.8 C), temperature source Oral, resp. rate 14, height 5\' 9"  (1.753 m), weight 77.8 kg (171 lb 8.3 oz), SpO2 96.00%. He looks systemically well. Lung fields are clear. Ventricular rate is controlled and in atrial flutter. He is alert and oriented.   Investigations:  Recent Results (from the past 240 hour(s))  MRSA PCR SCREENING     Status: None   Collection Time    04/25/13 12:14 PM      Result Value Range Status   MRSA by PCR NEGATIVE  NEGATIVE Final   Comment:            The GeneXpert MRSA Assay (FDA     approved for NASAL specimens     only), is one component of a     comprehensive MRSA colonization     surveillance program. It is not     intended to diagnose MRSA     infection nor to guide or     monitor treatment for     MRSA infections.     Basic Metabolic Panel:  Recent Labs  04/27/13 0441 04/28/13 0440  NA 136 136  K 4.7 4.4  CL 102 102  CO2 23 26  GLUCOSE 155* 124*  BUN 31* 30*  CREATININE 1.32 1.33  CALCIUM 8.7 8.4   Liver Function Tests:  Recent Labs  04/27/13 0441 04/28/13 0440  AST 57* 29  ALT 133* 91*  ALKPHOS 126* 104  BILITOT 0.3 0.2*  PROT 6.0 5.3*  ALBUMIN 2.7* 2.5*     CBC:  Recent Labs  04/27/13 0441 04/28/13 0440  WBC 15.8* 9.4  HGB 12.7* 12.5*  HCT 35.4* 36.3*  MCV 93.4 96.3  PLT 242 236    US Abdomen Limited Ruq  04/27/2013   *RADIOLOGY REPORT*  Clinical Data: Elevated LFTs.  LIMITED ABDOMINAL ULTRASOUND  Comparison:  None.  Findings:  Gallbladder:  1.3 cm non mobile gallstone in the region of the gallbladder neck.  No gallbladder wall thickening or pericholecystic fluid.  Per ultrasound technologist, the patient was  not tender over this region during scanning.  Common bile duct:  Proximally 5.5 mm.  Mid to distal aspect not visualized secondary to bowel gas.  Liver: No focal hepatic lesion or intrahepatic biliary duct dilatation.  IMPRESSION: 1.3 cm non mobile gallstone in the region of the gallbladder neck. No gallbladder wall thickening or pericholecystic fluid.  Per ultrasound technologist, the patient was not tender over this region during scanning.  Please see above.   Original Report Authenticated By: Genia Del, M.D.      Medications: I have reviewed the patient's current medications.  Impression: 1. Atrial fibrillation with rapid ventricular response, now rate better controlled. Echocardiogram normal. Anticoagulated with xeralto. 2. COPD with bronchospasm, improving. 3. Hypertension. 4. Tobacco abuse. 5. Abnormal liver enzyme elevation, improving. Ultrasound unremarkable except for a non-mobile gallstone in the region of the gallbladder neck. No gallbladder wall thickening.      Plan: 1. Await further cardiology recommendations. According to Dr. Alan Ripper note, he would benefit from cardioversion today.   Consultants:  [facility], Dr. Harrington Challenger.   Procedures:  Echocardiogram: Study Conclusions  Left ventricle: The cavity size was  normal. Wall thickness was increased in a pattern of moderate LVH. Systolic function was normal. The estimated ejection fraction was in the range of 55% to 60%. Transthoracic echocardiography. M-mode, complete 2D, spectral Doppler, and color Doppler. Height: Height: 175.3cm. Height: 69in. Weight: Weight: 74.4kg. Weight: 163.7lb. Body mass index: BMI: 24.2kg/m^2. Body surface area: BSA: 1.62m^2. Patient status: Inpatient. Location: ICU/CCU   Antibiotics:  None.                   Code Status: Full code.  Family Communication: Discussed plan with patient at the bedside.   Disposition Plan: Home when medically stable.  Time spent: 20 minutes.    LOS: 3 days   Doree Albee Pager [phone]  04/28/2013, 7:47 AM

## 2013-04-29 ENCOUNTER — Other Ambulatory Visit: Payer: Self-pay | Admitting: Family Medicine

## 2013-05-03 ENCOUNTER — Telehealth: Payer: Self-pay | Admitting: Family Medicine

## 2013-05-06 NOTE — Telephone Encounter (Signed)
Patient aware samples up front  

## 2013-05-06 NOTE — Telephone Encounter (Signed)
Spoke with patient and he also needs symbicort samples

## 2013-05-07 MED ORDER — BUDESONIDE-FORMOTEROL FUMARATE 160-4.5 MCG/ACT IN AERO: 2.0000 | INHALATION_SPRAY | Freq: Two times a day (BID) | RESPIRATORY_TRACT | Status: DC | PRN

## 2013-05-07 NOTE — Addendum Note (Signed)
Addended by: Ilean China on: 05/07/2013 10:56 AM   Modules accepted: Orders

## 2013-05-07 NOTE — Telephone Encounter (Signed)
Samples up front. Patient aware. 

## 2013-05-14 ENCOUNTER — Encounter: Payer: Medicare Other | Admitting: Adult Health

## 2013-05-15 ENCOUNTER — Encounter: Payer: Self-pay | Admitting: Adult Health

## 2013-05-15 ENCOUNTER — Ambulatory Visit (INDEPENDENT_AMBULATORY_CARE_PROVIDER_SITE_OTHER): Payer: Medicare Other | Admitting: Adult Health

## 2013-05-15 VITALS — BP 144/67 | HR 70 | Ht 69.5 in | Wt 165.1 lb

## 2013-05-15 DIAGNOSIS — I4892 Unspecified atrial flutter: Secondary | ICD-10-CM

## 2013-05-15 DIAGNOSIS — Z720 Tobacco use: Secondary | ICD-10-CM

## 2013-05-15 DIAGNOSIS — F17200 Nicotine dependence, unspecified, uncomplicated: Secondary | ICD-10-CM

## 2013-05-15 DIAGNOSIS — Z5189 Encounter for other specified aftercare: Secondary | ICD-10-CM

## 2013-05-15 DIAGNOSIS — J449 Chronic obstructive pulmonary disease, unspecified: Secondary | ICD-10-CM

## 2013-05-15 DIAGNOSIS — I10 Essential (primary) hypertension: Secondary | ICD-10-CM

## 2013-05-15 LAB — BASIC METABOLIC PANEL
BUN: 32 mg/dL — ABNORMAL HIGH (ref 6–23)
Calcium: 9.1 mg/dL (ref 8.4–10.5)
Potassium: 4.1 mEq/L (ref 3.5–5.3)
Sodium: 139 mEq/L (ref 135–145)

## 2013-05-15 NOTE — Assessment & Plan Note (Signed)
Pressure is well-controlled currently. He is advised to take his medications as her record. Would not make any changes at this time.

## 2013-05-15 NOTE — Assessment & Plan Note (Signed)
Patient remains in normal sinus rhythm on Cardizem and digoxin. However he is not taking his digoxin daily as directed. I have advised him to take it every day and not when he feels his heart irregular, to keep him in normal sinus rhythm. He will continue on Xarelto as directed. There is no overt signs of bleeding or excessive bruising. I will check a BMET for kidney function, and dig level. See him again in 6 months unless he is symptomatic. No plans to schedule him for cardioversion as he is in normal sinus rhythm.

## 2013-05-15 NOTE — Progress Notes (Deleted)
Name: Don [name]    DOB: 1941/08/11  Age: 72 y.o.  MR#: [id number]       PCP:  Anthoney Harada, MD      Insurance: Payor: [employer] MEDICARE / Plan: [employer] MEDICARE / Product Type: *No Product type* /   CC:    Chief Complaint  Patient presents with  . Atrial Flutter  . Shortness of Breath    VS Filed Vitals:   05/15/13 1105  BP: 144/67  Pulse: 70  Height: 5' 9.5" (1.765 m)  Weight: 165 lb 1.9 oz (74.898 kg)    Weights Current Weight  05/15/13 165 lb 1.9 oz (74.898 kg)  04/28/13 171 lb 8.3 oz (77.8 kg)  03/21/13 164 lb (74.39 kg)    Blood Pressure  BP Readings from Last 3 Encounters:  05/15/13 144/67  04/28/13 134/81  03/21/13 140/78     Admit date:  (Not on file) Last encounter with RMR:  05/14/2013   Allergy Bupropion  Current Outpatient Prescriptions  Medication Sig Dispense Refill  . amLODipine (NORVASC) 10 MG tablet Take 10 mg by mouth daily.       . beta carotene w/minerals (OCUVITE) tablet Take 1 tablet by mouth daily.      . budesonide-formoterol (SYMBICORT) 160-4.5 MCG/ACT inhaler Inhale 2 puffs into the lungs 2 (two) times daily as needed.  1 Inhaler  2  . clonazePAM (KLONOPIN) 0.5 MG tablet Take 0.5 mg by mouth 2 (two) times daily as needed for anxiety.       . digoxin (LANOXIN) 0.25 MG tablet Take 1 tablet (0.25 mg total) by mouth daily.  30 tablet  0  . diltiazem (CARDIZEM CD) 240 MG 24 hr capsule Take 1 capsule (240 mg total) by mouth daily.  30 capsule  0  . DULoxetine (CYMBALTA) 60 MG capsule Take 60 mg by mouth daily.       . fish oil-omega-3 fatty acids 1000 MG capsule Take 2 g by mouth 2 (two) times daily.       . fluorouracil (EFUDEX) 5 % cream Apply 1 application topically daily as needed.       . mometasone (ELOCON) 0.1 % cream Apply 1 application topically 2 (two) times daily as needed.       . Multiple Vitamin (MULTIVITAMIN WITH MINERALS) TABS Take 1 tablet by mouth daily.      [name] NITROSTAT 0.4 MG SL tablet DISSOLVE ONE TABLET  UNDER THE TONGUE EVERY 5 MINUTES AS NEEDED FOR CHEST PAIN.  DO NOT EXCEED A TOTAL OF 3 DOSES IN 15 MINUTES  25 tablet  0  . Polyethyl Glycol-Propyl Glycol (SYSTANE OP) Place 1 drop into both eyes 2 (two) times daily.      . pravastatin (PRAVACHOL) 40 MG tablet Take 40 mg by mouth daily.      . rivaroxaban (XARELTO) 20 MG TABS Take 1 tablet (20 mg total) by mouth daily with supper.  30 tablet  0  . tamsulosin (FLOMAX) 0.4 MG CAPS Take 0.4 mg by mouth daily.      [name] tiotropium (SPIRIVA) 18 MCG inhalation capsule Place 18 mcg into inhaler and inhale as needed.        . zolpidem (AMBIEN) 10 MG tablet Take 10 mg by mouth at bedtime as needed for sleep.       No current facility-administered medications for this visit.    Discontinued Meds:   There are no discontinued medications.  Patient Active Problem List   Diagnosis Date Noted  .  Atrial flutter with rapid ventricular response 04/25/2013  . Tobacco abuse 04/25/2013  . Hernia 02/08/2011  . Prostate cancer 02/08/2011  . COPD (chronic obstructive pulmonary disease) 02/08/2011  . Hypertension 02/08/2011    LABS    Component Value Date/Time   NA 136 04/28/2013 0440   NA 136 04/27/2013 0441   NA 133* 04/26/2013 0532   K 4.4 04/28/2013 0440   K 4.7 04/27/2013 0441   K 4.6 04/26/2013 0532   CL 102 04/28/2013 0440   CL 102 04/27/2013 0441   CL 100 04/26/2013 0532   CO2 26 04/28/2013 0440   CO2 23 04/27/2013 0441   CO2 23 04/26/2013 0532   GLUCOSE 124* 04/28/2013 0440   GLUCOSE 155* 04/27/2013 0441   GLUCOSE 200* 04/26/2013 0532   BUN 30* 04/28/2013 0440   BUN 31* 04/27/2013 0441   BUN 28* 04/26/2013 0532   CREATININE 1.33 04/28/2013 0440   CREATININE 1.32 04/27/2013 0441   CREATININE 1.26 04/26/2013 0532   CALCIUM 8.4 04/28/2013 0440   CALCIUM 8.7 04/27/2013 0441   CALCIUM 8.3* 04/26/2013 0532   GFRNONAA 51* 04/28/2013 0440   GFRNONAA 52* 04/27/2013 0441   GFRNONAA 55* 04/26/2013 0532   GFRAA 60* 04/28/2013 0440   GFRAA 60* 04/27/2013 0441   GFRAA 64*  04/26/2013 0532   CMP     Component Value Date/Time   NA 136 04/28/2013 0440   K 4.4 04/28/2013 0440   CL 102 04/28/2013 0440   CO2 26 04/28/2013 0440   GLUCOSE 124* 04/28/2013 0440   BUN 30* 04/28/2013 0440   CREATININE 1.33 04/28/2013 0440   CALCIUM 8.4 04/28/2013 0440   PROT 5.3* 04/28/2013 0440   ALBUMIN 2.5* 04/28/2013 0440   AST 29 04/28/2013 0440   ALT 91* 04/28/2013 0440   ALKPHOS 104 04/28/2013 0440   BILITOT 0.2* 04/28/2013 0440   GFRNONAA 51* 04/28/2013 0440   GFRAA 60* 04/28/2013 0440       Component Value Date/Time   WBC 9.4 04/28/2013 0440   WBC 15.8* 04/27/2013 0441   WBC 10.2 04/26/2013 0532   HGB 12.5* 04/28/2013 0440   HGB 12.7* 04/27/2013 0441   HGB 12.2* 04/26/2013 0532   HCT 36.3* 04/28/2013 0440   HCT 35.4* 04/27/2013 0441   HCT 35.4* 04/26/2013 0532   MCV 96.3 04/28/2013 0440   MCV 93.4 04/27/2013 0441   MCV 95.2 04/26/2013 0532    Lipid Panel  No results found for this basename: chol, trig, hdl, cholhdl, vldl, ldlcalc    ABG No results found for this basename: phart, pco2, pco2art, po2, po2art, hco3, tco2, acidbasedef, o2sat     Lab Results  Component Value Date   TSH 1.333 04/25/2013   BNP (last 3 results) No results found for this basename: PROBNP,  in the last 8760 hours Cardiac Panel (last 3 results) No results found for this basename: CKTOTAL, CKMB, TROPONINI, RELINDX,  in the last 72 hours  Iron/TIBC/Ferritin No results found for this basename: iron, tibc, ferritin     EKG Orders placed in visit on 05/15/13  . EKG 12-LEAD     Prior Assessment and Plan Problem List as of 05/15/2013   Hernia   Prostate cancer   COPD (chronic obstructive pulmonary disease)   Hypertension   Atrial flutter with rapid ventricular response   Tobacco abuse       Imaging: Dg Chest Port 1 View  04/25/2013   *RADIOLOGY REPORT*  Clinical Data: Chest pain, shortness of breath, tachycardia, history of  hypertension and COPD  PORTABLE CHEST - 1 VIEW  Comparison: None.   Findings: Normal cardiac silhouette and mediastinal contours.  The lungs appear hyperinflated with flattening of the bilateral hemidiaphragms.  There is mild diffuse slightly nodular thickening of the pulmonary interstitium.  No definite pleural effusion, though note, the right costophrenic angle is excluded from view. No definite evidence of edema.  No pneumothorax.  No definite acute osseous abnormality.  IMPRESSION: Hyperexpanded lungs and bronchitic change without definite acute cardiopulmonary disease on this AP portable examination.  Further evaluation with a PA and lateral chest radiograph may be obtained as clinically indicated.   Original Report Authenticated By: Jake Seats, MD   US Abdomen Limited Ruq  04/27/2013   *RADIOLOGY REPORT*  Clinical Data: Elevated LFTs.  LIMITED ABDOMINAL ULTRASOUND  Comparison:  None.  Findings:  Gallbladder:  1.3 cm non mobile gallstone in the region of the gallbladder neck.  No gallbladder wall thickening or pericholecystic fluid.  Per ultrasound technologist, the patient was not tender over this region during scanning.  Common bile duct:  Proximally 5.5 mm.  Mid to distal aspect not visualized secondary to bowel gas.  Liver: No focal hepatic lesion or intrahepatic biliary duct dilatation.  IMPRESSION: 1.3 cm non mobile gallstone in the region of the gallbladder neck. No gallbladder wall thickening or pericholecystic fluid.  Per ultrasound technologist, the patient was not tender over this region during scanning.  Please see above.   Original Report Authenticated By: Genia Del, M.D.

## 2013-05-15 NOTE — Progress Notes (Signed)
HPI: Don [name] is a 72 year old patient of Dr. Lattie Haw we are following for ongoing assessment and management of atrial flutter and fibrillation, hypertension, with history of COPD and tobacco abuse. The patient was recently admitted to [facility] in the setting of chest pain palpitations and dyspnea  The patient was found to have new onset atrial fib flutter was placed on a Cardizem drip, transition to by mouth Cardizem with addition of Xarelto. Discussion of need for cardioversion was discussed during hospitalization, with followup appointment today.   He comes today without any complaints. He is not exactly medically compliant however, he states that he occasionally feels his heart racing and beating irregular at that time he will take a digoxin tablet. He does not take his medications daily as directed usually taking them when he feels his heart irregular. He denies any bleeding, dizziness, or excessive bruising. Unfortunately continues to smoke, and states that his anxiety level is worsened when he does not, as he is taking care of an invalid wife. He is aware of the risks. Allergies  Allergen Reactions  . Bupropion Anxiety    Current Outpatient Prescriptions  Medication Sig Dispense Refill  . amLODipine (NORVASC) 10 MG tablet Take 10 mg by mouth daily.       . beta carotene w/minerals (OCUVITE) tablet Take 1 tablet by mouth daily.      . budesonide-formoterol (SYMBICORT) 160-4.5 MCG/ACT inhaler Inhale 2 puffs into the lungs 2 (two) times daily as needed.  1 Inhaler  2  . clonazePAM (KLONOPIN) 0.5 MG tablet Take 0.5 mg by mouth 2 (two) times daily as needed for anxiety.       . digoxin (LANOXIN) 0.25 MG tablet Take 1 tablet (0.25 mg total) by mouth daily.  30 tablet  0  . diltiazem (CARDIZEM CD) 240 MG 24 hr capsule Take 1 capsule (240 mg total) by mouth daily.  30 capsule  0  . DULoxetine (CYMBALTA) 60 MG capsule Take 60 mg by mouth daily.       . fish oil-omega-3 fatty acids  1000 MG capsule Take 2 g by mouth 2 (two) times daily.       . fluorouracil (EFUDEX) 5 % cream Apply 1 application topically daily as needed.       . mometasone (ELOCON) 0.1 % cream Apply 1 application topically 2 (two) times daily as needed.       . Multiple Vitamin (MULTIVITAMIN WITH MINERALS) TABS Take 1 tablet by mouth daily.      [name] NITROSTAT 0.4 MG SL tablet DISSOLVE ONE TABLET UNDER THE TONGUE EVERY 5 MINUTES AS NEEDED FOR CHEST PAIN.  DO NOT EXCEED A TOTAL OF 3 DOSES IN 15 MINUTES  25 tablet  0  . Polyethyl Glycol-Propyl Glycol (SYSTANE OP) Place 1 drop into both eyes 2 (two) times daily.      . pravastatin (PRAVACHOL) 40 MG tablet Take 40 mg by mouth daily.      . rivaroxaban (XARELTO) 20 MG TABS Take 1 tablet (20 mg total) by mouth daily with supper.  30 tablet  0  . tamsulosin (FLOMAX) 0.4 MG CAPS Take 0.4 mg by mouth daily.      [name] tiotropium (SPIRIVA) 18 MCG inhalation capsule Place 18 mcg into inhaler and inhale as needed.        . zolpidem (AMBIEN) 10 MG tablet Take 10 mg by mouth at bedtime as needed for sleep.       No current facility-administered medications for  this visit.    Past Medical History  Diagnosis Date  . Hyperlipidemia   . Hypertension   . COPD (chronic obstructive pulmonary disease)   . Cancer     Past Surgical History  Procedure Laterality Date  . 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 Anal abcess,hemorroids,      [id number] of systems complete and found to be negative unless listed above  PHYSICAL EXAM BP 144/67  Pulse 70  Ht 5' 9.5" (1.765 m)  Wt 165 lb 1.9 oz (74.898 kg)  BMI 24.04 kg/m2  General: Well developed, well nourished, in no acute distress Head: Eyes PERRLA, No xanthomas.   Normal cephalic and atramatic  Lungs: Scattered minimal in the bases. No wheezes. Heart: HRRR S1 S2, without MRG.  Pulses are 2+ & equal.            No carotid bruit. No JVD.  No abdominal bruits. No femoral bruits. Abdomen: Bowel sounds are positive, abdomen  soft and non-tender without masses or                  Hernia's noted. Msk:  Back normal, normal gait. Normal strength and tone for age. Extremities: No clubbing, cyanosis or edema. Several plaque lesions on the arms.   DP +1 Neuro: Alert and oriented X 3. Psych:  Good affect, responds appropriately  EKG:NSR rate of 70 bpm.  ASSESSMENT AND PLAN

## 2013-05-15 NOTE — Assessment & Plan Note (Signed)
Breathing status remains stable. No over wheezing is noted. Smoking cessation will assist in his breathing status.

## 2013-05-15 NOTE — Assessment & Plan Note (Signed)
Unfortunately, he continues to smoke. He is under a lot of stress taking care of an invalid wife. He sees PCP sometime next week and he will be placed on some sort of inhaler and medication doses to help him with smoking cessation. I then carried chest him to do the best he can to stop smoking as this is the major cardiovascular risk factor.

## 2013-05-15 NOTE — Patient Instructions (Addendum)
Your physician recommends that you schedule a follow-up appointment in: [place] physician recommends that you continue on your current medications as directed. Please refer to the Current Medication list given to you today.TAKE YOUR MEDICATIONS AS DIRECTED  Your physician recommends that you return for lab work in: [place] BMET, DIG LEVEL)

## 2013-05-16 LAB — DIGOXIN LEVEL: Digoxin Level: 0.6 ng/mL — ABNORMAL LOW (ref 0.8–2.0)

## 2013-05-19 ENCOUNTER — Encounter: Payer: Self-pay | Admitting: Family Medicine

## 2013-05-19 ENCOUNTER — Ambulatory Visit (INDEPENDENT_AMBULATORY_CARE_PROVIDER_SITE_OTHER): Payer: Medicare Other | Admitting: Family Medicine

## 2013-05-19 VITALS — BP 127/67 | HR 73 | Temp 97.0°F | Wt 164.8 lb

## 2013-05-19 DIAGNOSIS — F17200 Nicotine dependence, unspecified, uncomplicated: Secondary | ICD-10-CM

## 2013-05-19 DIAGNOSIS — I4891 Unspecified atrial fibrillation: Secondary | ICD-10-CM | POA: Insufficient documentation

## 2013-05-19 DIAGNOSIS — I10 Essential (primary) hypertension: Secondary | ICD-10-CM

## 2013-05-19 DIAGNOSIS — J449 Chronic obstructive pulmonary disease, unspecified: Secondary | ICD-10-CM

## 2013-05-19 DIAGNOSIS — Z720 Tobacco use: Secondary | ICD-10-CM

## 2013-05-19 NOTE — Patient Instructions (Addendum)
You Can Quit Smoking If you are ready to quit smoking or are thinking about it, congratulations! You have chosen to help yourself be healthier and live longer! There are lots of different ways to quit smoking. Nicotine gum, nicotine patches, a nicotine inhaler, or nicotine nasal spray can help with physical craving. Hypnosis, support groups, and medicines help break the habit of smoking. TIPS TO GET OFF AND STAY OFF CIGARETTES  Learn to predict your moods. Do not let a bad situation be your excuse to have a cigarette. Some situations in your life might tempt you to have a cigarette.  Ask friends and co-workers not to smoke around you.  Make your home smoke-free.  Never have "just one" cigarette. It leads to wanting another and another. Remind yourself of your decision to quit.  On a card, make a list of your reasons for not smoking. Read it at least the same number of times a day as you have a cigarette. Tell yourself everyday, "I do not want to smoke. I choose not to smoke."  Ask someone at home or work to help you with your plan to quit smoking.  Have something planned after you eat or have a cup of coffee. Take a walk or get other exercise to perk you up. This will help to keep you from overeating.  Try a relaxation exercise to calm you down and decrease your stress. Remember, you may be tense and nervous the first two weeks after you quit. This will pass.  Find new activities to keep your hands busy. Play with a pen, coin, or rubber band. Doodle or draw things on paper.  Brush your teeth right after eating. This will help cut down the craving for the taste of tobacco after meals. You can try mouthwash too.  Try gum, breath mints, or diet candy to keep something in your mouth. IF YOU SMOKE AND WANT TO QUIT:  Do not stock up on cigarettes. Never buy a carton. Wait until one pack is finished before you buy another.  Never carry cigarettes with you at work or at home.  Keep cigarettes  as far away from you as possible. Leave them with someone else.  Never carry matches or a lighter with you.  Ask yourself, "Do I need this cigarette or is this just a reflex?"  Bet with someone that you can quit. Put cigarette money in a piggy bank every morning. If you smoke, you give up the money. If you do not smoke, by the end of the week, you keep the money.  Keep trying. It takes 21 days to change a habit!  Talk to your doctor about using medicines to help you quit. These include nicotine replacement gum, lozenges, or skin patches. Document Released: 08/26/2009 Document Revised: 01/22/2012 Document Reviewed: 08/26/2009 [employer] Patient Information 2014 [employer].  

## 2013-05-19 NOTE — Progress Notes (Signed)
Patient ID: Don [name], male   DOB: 03/15/1941, 72 y.o.   MRN: [id number] SUBJECTIVE: CC: Chief Complaint  Patient presents with  . Follow-up    wants rx to stop smokingwants nictrol      HPI: Here for smoking cessation Rx . Has reduced his smoking to 2 packs per week. Is aware that he needs to make the necessary lifestyle changes. Has had atrial fib/flutter and hospitalization which was reviewed in Epic. Trying to be more compliant. Follow up of COPD. Breathing stable. Also, Patient is here for follow up of hypertension: denies Headache;deniesChest Pain;denies weakness;denies Shortness of Breath or Orthopnea;denies Visual changes;denies palpitations;denies cough;denies pedal edema;denies symptoms of TIA or stroke; admits to Compliance with medications. denies Problems with medications.  Past Medical History  Diagnosis Date  . Hyperlipidemia   . Hypertension   . COPD (chronic obstructive pulmonary disease)   . Cancer    Past Surgical History  Procedure Laterality Date  . 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 Anal abcess,hemorroids,     History   Social History  . Marital Status: Married    Spouse Name: N/A    Number of Children: N/A  . Years of Education: N/A   Occupational History  . Not on file.   Social History Main Topics  . Smoking status: Current Every Day Smoker    Types: Cigarettes, Cigars  . Smokeless tobacco: Not on file  . Alcohol Use: No  . Drug Use: No  . Sexually Active: Not on file   Other Topics Concern  . Not on file   Social History Narrative  . No narrative on file   Family History  Problem Relation Age of Onset  . Diabetes Father   . Heart disease Father     MI   Current Outpatient Prescriptions on File Prior to Visit  Medication Sig Dispense Refill  . amLODipine (NORVASC) 10 MG tablet Take 10 mg by mouth daily.       . beta carotene w/minerals (OCUVITE) tablet Take 1 tablet by mouth daily.      . budesonide-formoterol  (SYMBICORT) 160-4.5 MCG/ACT inhaler Inhale 2 puffs into the lungs 2 (two) times daily as needed.  1 Inhaler  2  . clonazePAM (KLONOPIN) 0.5 MG tablet Take 0.5 mg by mouth 2 (two) times daily as needed for anxiety.       . digoxin (LANOXIN) 0.25 MG tablet Take 1 tablet (0.25 mg total) by mouth daily.  30 tablet  0  . diltiazem (CARDIZEM CD) 240 MG 24 hr capsule Take 1 capsule (240 mg total) by mouth daily.  30 capsule  0  . DULoxetine (CYMBALTA) 60 MG capsule Take 60 mg by mouth daily.       . fish oil-omega-3 fatty acids 1000 MG capsule Take 2 g by mouth 2 (two) times daily.       . fluorouracil (EFUDEX) 5 % cream Apply 1 application topically daily as needed.       . mometasone (ELOCON) 0.1 % cream Apply 1 application topically 2 (two) times daily as needed.       . Multiple Vitamin (MULTIVITAMIN WITH MINERALS) TABS Take 1 tablet by mouth daily.      [name] NITROSTAT 0.4 MG SL tablet DISSOLVE ONE TABLET UNDER THE TONGUE EVERY 5 MINUTES AS NEEDED FOR CHEST PAIN.  DO NOT EXCEED A TOTAL OF 3 DOSES IN 15 MINUTES  25 tablet  0  . Polyethyl Glycol-Propyl Glycol (SYSTANE OP) Place 1 drop into  both eyes 2 (two) times daily.      . pravastatin (PRAVACHOL) 40 MG tablet Take 40 mg by mouth daily.      . rivaroxaban (XARELTO) 20 MG TABS Take 1 tablet (20 mg total) by mouth daily with supper.  30 tablet  0  . tamsulosin (FLOMAX) 0.4 MG CAPS Take 0.4 mg by mouth daily.      [name] tiotropium (SPIRIVA) 18 MCG inhalation capsule Place 18 mcg into inhaler and inhale as needed.        . zolpidem (AMBIEN) 10 MG tablet Take 10 mg by mouth at bedtime as needed for sleep.       No current facility-administered medications on file prior to visit.   Allergies  Allergen Reactions  . Bupropion Anxiety    There is no immunization history on file for this patient. Prior to Admission medications   Medication Sig Start Date End Date Taking? Authorizing Provider  amLODipine (NORVASC) 10 MG tablet Take 10 mg by mouth daily.   04/11/13  Yes Historical Provider, MD  beta carotene w/minerals (OCUVITE) tablet Take 1 tablet by mouth daily.   Yes Historical Provider, MD  budesonide-formoterol (SYMBICORT) 160-4.5 MCG/ACT inhaler Inhale 2 puffs into the lungs 2 (two) times daily as needed. 05/07/13  Yes Chipper Herb, MD  clonazePAM (KLONOPIN) 0.5 MG tablet Take 0.5 mg by mouth 2 (two) times daily as needed for anxiety.  03/04/13  Yes Historical Provider, MD  digoxin (LANOXIN) 0.25 MG tablet Take 1 tablet (0.25 mg total) by mouth daily. 04/28/13  Yes Nimish Luther Parody, MD  diltiazem (CARDIZEM CD) 240 MG 24 hr capsule Take 1 capsule (240 mg total) by mouth daily. 04/28/13  Yes Nimish Luther Parody, MD  DULoxetine (CYMBALTA) 60 MG capsule Take 60 mg by mouth daily.  03/17/13  Yes Historical Provider, MD  fish oil-omega-3 fatty acids 1000 MG capsule Take 2 g by mouth 2 (two) times daily.    Yes Historical Provider, MD  fluorouracil (EFUDEX) 5 % cream Apply 1 application topically daily as needed.  03/13/13  Yes Historical Provider, MD  mometasone (ELOCON) 0.1 % cream Apply 1 application topically 2 (two) times daily as needed.  03/13/13  Yes Historical Provider, MD  Multiple Vitamin (MULTIVITAMIN WITH MINERALS) TABS Take 1 tablet by mouth daily.   Yes Historical Provider, MD  NITROSTAT 0.4 MG SL tablet DISSOLVE ONE TABLET UNDER THE TONGUE EVERY 5 MINUTES AS NEEDED FOR CHEST PAIN.  DO NOT EXCEED A TOTAL OF 3 DOSES IN 15 MINUTES 04/29/13  Yes Vernie Shanks, MD  Polyethyl Glycol-Propyl Glycol (SYSTANE OP) Place 1 drop into both eyes 2 (two) times daily.   Yes Historical Provider, MD  pravastatin (PRAVACHOL) 40 MG tablet Take 40 mg by mouth daily.   Yes Historical Provider, MD  rivaroxaban (XARELTO) 20 MG TABS Take 1 tablet (20 mg total) by mouth daily with supper. 04/28/13  Yes Nimish Luther Parody, MD  tamsulosin (FLOMAX) 0.4 MG CAPS Take 0.4 mg by mouth daily.   Yes Historical Provider, MD  tiotropium (SPIRIVA) 18 MCG inhalation capsule Place 18 mcg  into inhaler and inhale as needed.     Yes Historical Provider, MD  zolpidem (AMBIEN) 10 MG tablet Take 10 mg by mouth at bedtime as needed for sleep.   Yes Historical Provider, MD    ROS: As above in the HPI. All other systems are stable or negative.  OBJECTIVE: APPEARANCE:  Patient in no acute distress.The patient appeared well nourished  and normally developed. Acyanotic. Waist: VITAL SIGNS:BP 127/67  Pulse 73  Temp(Src) 97 F (36.1 C) (Oral)  Wt 164 lb 12.8 oz (74.753 kg)  BMI 24 kg/m2 WM  SKIN: warm and  Dry without overt rashes, tattoos and scars  HEAD and Neck: without JVD, Head and scalp: normal Eyes:No scleral icterus. Fundi normal, eye movements normal. Ears: Auricle normal, canal normal, Tympanic membranes normal, insufflation normal. Nose: normal Throat: normal Neck & thyroid: normal  CHEST & LUNGS: Chest wall: normal Lungs: Clear  CVS: Reveals the PMI to be normally located. Controlled rate First and Second Heart sounds are normal,  absence of murmurs, rubs or gallops. Peripheral vasculature: Radial pulses: normal  ABDOMEN:  Appearance: normal Benign, no organomegaly, no masses, no Abdominal Aortic enlargement. No Guarding , no rebound. No Bruits. Bowel sounds: normal  RECTAL: N/A GU: N/A  EXTREMETIES: nonedematous.  MUSCULOSKELETAL:  Spine: normal Joints: intact  NEUROLOGIC: oriented to time,place and person; nonfocal. Strength is normal Sensory is normal Reflexes are normal Cranial Nerves are normal.  ASSESSMENT: Tobacco abuse  Hypertension  COPD (chronic obstructive pulmonary disease)  Atrial fibrillation    PLAN: Smoking cessation counselling for 25 minutes. Handout in the AVS.  Reviewed options of meds. Patient unable to tolerate bupropion in past and it apparently caused an anxiety responswe. Risks with nictrol nasal spray and arrthymia discussed with patient and he declined, and I agreed with him that the risks is   Significant in the face of his recent A fib/flutter.  He opted to stop on his own, after counselling.   Return in about 2 months (around 07/20/2013) for Recheck medical problems.  Renna Kilmer P. Jacelyn Grip, M.D.

## 2013-05-26 ENCOUNTER — Other Ambulatory Visit: Payer: Self-pay | Admitting: Nurse Practitioner

## 2013-05-28 NOTE — Telephone Encounter (Signed)
Last seen 12/16/12   Don [name]  If approved route to nurse to call in and notify patient

## 2013-06-04 ENCOUNTER — Telehealth: Payer: Self-pay | Admitting: Family Medicine

## 2013-06-04 NOTE — Telephone Encounter (Signed)
Pt was calling just to confirm his med list- spoke with him today

## 2013-06-09 ENCOUNTER — Telehealth: Payer: Self-pay | Admitting: Family Medicine

## 2013-06-09 NOTE — Telephone Encounter (Signed)
Spoke with pt and he was advised to call his pharmacist for refill to ensure safety. Pt verbalized understanding and stated would call [facility].

## 2013-06-11 ENCOUNTER — Other Ambulatory Visit: Payer: Self-pay | Admitting: *Deleted

## 2013-06-11 MED ORDER — DIGOXIN 250 MCG PO TABS: 0.2500 mg | ORAL_TABLET | Freq: Every day | ORAL | Status: DC

## 2013-07-02 ENCOUNTER — Telehealth: Payer: Self-pay | Admitting: Family Medicine

## 2013-07-02 MED ORDER — DULOXETINE HCL 60 MG PO CPEP: 60.0000 mg | ORAL_CAPSULE | Freq: Every day | ORAL | Status: DC

## 2013-07-02 NOTE — Telephone Encounter (Signed)
x

## 2013-07-04 NOTE — Telephone Encounter (Signed)
ERROR

## 2013-07-22 ENCOUNTER — Ambulatory Visit: Payer: Medicare Other | Admitting: Family Medicine

## 2013-08-11 ENCOUNTER — Encounter: Payer: Self-pay | Admitting: *Deleted

## 2013-09-05 ENCOUNTER — Ambulatory Visit: Payer: Medicare Other | Admitting: Family Medicine

## 2013-09-08 ENCOUNTER — Ambulatory Visit: Payer: Medicare Other | Admitting: Family Medicine

## 2013-10-16 ENCOUNTER — Ambulatory Visit (INDEPENDENT_AMBULATORY_CARE_PROVIDER_SITE_OTHER): Payer: Medicare Other | Admitting: Family Medicine

## 2013-10-16 ENCOUNTER — Encounter: Payer: Self-pay | Admitting: Family Medicine

## 2013-10-16 VITALS — BP 162/87 | HR 82 | Temp 97.5°F | Ht 69.5 in | Wt 165.2 lb

## 2013-10-16 DIAGNOSIS — K469 Unspecified abdominal hernia without obstruction or gangrene: Secondary | ICD-10-CM

## 2013-10-16 DIAGNOSIS — F17200 Nicotine dependence, unspecified, uncomplicated: Secondary | ICD-10-CM

## 2013-10-16 DIAGNOSIS — M659 Synovitis and tenosynovitis, unspecified: Secondary | ICD-10-CM | POA: Insufficient documentation

## 2013-10-16 DIAGNOSIS — Z720 Tobacco use: Secondary | ICD-10-CM

## 2013-10-16 DIAGNOSIS — K625 Hemorrhage of anus and rectum: Secondary | ICD-10-CM | POA: Insufficient documentation

## 2013-10-16 DIAGNOSIS — M65839 Other synovitis and tenosynovitis, unspecified forearm: Secondary | ICD-10-CM

## 2013-10-16 DIAGNOSIS — I4891 Unspecified atrial fibrillation: Secondary | ICD-10-CM

## 2013-10-16 DIAGNOSIS — E785 Hyperlipidemia, unspecified: Secondary | ICD-10-CM

## 2013-10-16 DIAGNOSIS — I4892 Unspecified atrial flutter: Secondary | ICD-10-CM

## 2013-10-16 DIAGNOSIS — M65949 Unspecified synovitis and tenosynovitis, unspecified hand: Secondary | ICD-10-CM | POA: Insufficient documentation

## 2013-10-16 DIAGNOSIS — J449 Chronic obstructive pulmonary disease, unspecified: Secondary | ICD-10-CM

## 2013-10-16 DIAGNOSIS — I10 Essential (primary) hypertension: Secondary | ICD-10-CM

## 2013-10-16 LAB — POCT CBC
Granulocyte percent: 67.9 %G (ref 37–80)
HCT, POC: 45.2 % (ref 43.5–53.7)
Hemoglobin: 14.5 g/dL (ref 14.1–18.1)
Lymph, poc: 2 (ref 0.6–3.4)
MCH, POC: 31.2 pg (ref 27–31.2)
MCHC: 32 g/dL (ref 31.8–35.4)
MCV: 97.4 fL — AB (ref 80–97)
MPV: 7.5 fL (ref 0–99.8)
POC Granulocyte: 4.8 (ref 2–6.9)
POC LYMPH PERCENT: 28 %L (ref 10–50)
Platelet Count, POC: 237 10*3/uL (ref 142–424)
RBC: 4.6 M/uL — AB (ref 4.69–6.13)
RDW, POC: 13 %
WBC: 7 10*3/uL (ref 4.6–10.2)

## 2013-10-16 MED ORDER — DICLOFENAC SODIUM 1 % TD GEL: 2.0000 g | Freq: Four times a day (QID) | TRANSDERMAL | Status: DC

## 2013-10-16 NOTE — Progress Notes (Signed)
Patient ID: Don [name], male   DOB: Aug 10, 1941, 72 y.o.   MRN: [id number] SUBJECTIVE: CC: Chief Complaint  Patient presents with  . Follow-up    2 MONTH FOLLOW UP AND C/O LEFT THUMB PAINFUL   . Medication Refill    REFILL SPIRVIA SYMBICORT    HPI:   Patient is here for follow up of hyperlipidemia/HTN/copd/left thumb joint sore and along the tendon of the palmar aspect of the left thumb/atrial fibrillation. denies Headache;denies Chest Pain;denies weakness;denies Shortness of Breath and orthopnea;denies Visual changes;denies palpitations;denies cough;denies pedal edema;denies symptoms of TIA or stroke;deniesClaudication symptoms. admits to Compliance with medications; denies Problems with medications.   Past Medical History  Diagnosis Date  . Hyperlipidemia   . Hypertension   . COPD (chronic obstructive pulmonary disease)   . Cancer    Past Surgical History  Procedure Laterality Date  . 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 Anal abcess,hemorroids,     History   Social History  . Marital Status: Married    Spouse Name: N/A    Number of Children: N/A  . Years of Education: N/A   Occupational History  . Not on file.   Social History Main Topics  . Smoking status: Current Every Day Smoker    Types: Cigarettes, Cigars  . Smokeless tobacco: Not on file  . Alcohol Use: No  . Drug Use: No  . Sexual Activity: Not on file   Other Topics Concern  . Not on file   Social History Narrative  . No narrative on file   Family History  Problem Relation Age of Onset  . Diabetes Father   . Heart disease Father     MI   Current Outpatient Prescriptions on File Prior to Visit  Medication Sig Dispense Refill  . amLODipine (NORVASC) 10 MG tablet Take 10 mg by mouth daily.       . beta carotene w/minerals (OCUVITE) tablet Take 1 tablet by mouth daily.      . budesonide-formoterol (SYMBICORT) 160-4.5 MCG/ACT inhaler Inhale 2 puffs into the lungs 2 (two) times daily as  needed.  1 Inhaler  2  . clonazePAM (KLONOPIN) 0.5 MG tablet Take 0.5 mg by mouth 2 (two) times daily as needed for anxiety.       . digoxin (LANOXIN) 0.25 MG tablet Take 1 tablet (0.25 mg total) by mouth daily.  30 tablet  2  . diltiazem (CARDIZEM CD) 240 MG 24 hr capsule Take 1 capsule (240 mg total) by mouth daily.  30 capsule  0  . DULoxetine (CYMBALTA) 60 MG capsule Take 1 capsule (60 mg total) by mouth daily.  30 capsule  2  . fish oil-omega-3 fatty acids 1000 MG capsule Take 2 g by mouth 2 (two) times daily.       . fluorouracil (EFUDEX) 5 % cream Apply 1 application topically daily as needed.       . mometasone (ELOCON) 0.1 % cream Apply 1 application topically 2 (two) times daily as needed.       . Multiple Vitamin (MULTIVITAMIN WITH MINERALS) TABS Take 1 tablet by mouth daily.      [name] NITROSTAT 0.4 MG SL tablet DISSOLVE ONE TABLET UNDER THE TONGUE EVERY 5 MINUTES AS NEEDED FOR CHEST PAIN.  DO NOT EXCEED A TOTAL OF 3 DOSES IN 15 MINUTES  25 tablet  0  . Polyethyl Glycol-Propyl Glycol (SYSTANE OP) Place 1 drop into both eyes 2 (two) times daily.      . pravastatin (  PRAVACHOL) 40 MG tablet Take 40 mg by mouth daily.      . tamsulosin (FLOMAX) 0.4 MG CAPS Take 0.4 mg by mouth daily.      [name] tiotropium (SPIRIVA) 18 MCG inhalation capsule Place 18 mcg into inhaler and inhale as needed.        . zolpidem (AMBIEN) 10 MG tablet TAKE ONE TABLET BY MOUTH AT BEDTIME AS NEEDED FOR  SLEEP  30 tablet  0   No current facility-administered medications on file prior to visit.   Allergies  Allergen Reactions  . Bupropion Anxiety    There is no immunization history on file for this patient. Prior to Admission medications   Medication Sig Start Date End Date Taking? Authorizing Provider  amLODipine (NORVASC) 10 MG tablet Take 10 mg by mouth daily.  04/11/13   Historical Provider, MD  beta carotene w/minerals (OCUVITE) tablet Take 1 tablet by mouth daily.    Historical Provider, MD   budesonide-formoterol (SYMBICORT) 160-4.5 MCG/ACT inhaler Inhale 2 puffs into the lungs 2 (two) times daily as needed. 05/07/13   Chipper Herb, MD  clonazePAM (KLONOPIN) 0.5 MG tablet Take 0.5 mg by mouth 2 (two) times daily as needed for anxiety.  03/04/13   Historical Provider, MD  digoxin (LANOXIN) 0.25 MG tablet Take 1 tablet (0.25 mg total) by mouth daily. 06/11/13   Vernie Shanks, MD  diltiazem (CARDIZEM CD) 240 MG 24 hr capsule Take 1 capsule (240 mg total) by mouth daily. 04/28/13   Nimish Luther Parody, MD  DULoxetine (CYMBALTA) 60 MG capsule Take 1 capsule (60 mg total) by mouth daily. 07/02/13   Chipper Herb, MD  fish oil-omega-3 fatty acids 1000 MG capsule Take 2 g by mouth 2 (two) times daily.     Historical Provider, MD  fluorouracil (EFUDEX) 5 % cream Apply 1 application topically daily as needed.  03/13/13   Historical Provider, MD  mometasone (ELOCON) 0.1 % cream Apply 1 application topically 2 (two) times daily as needed.  03/13/13   Historical Provider, MD  Multiple Vitamin (MULTIVITAMIN WITH MINERALS) TABS Take 1 tablet by mouth daily.    Historical Provider, MD  NITROSTAT 0.4 MG SL tablet DISSOLVE ONE TABLET UNDER THE TONGUE EVERY 5 MINUTES AS NEEDED FOR CHEST PAIN.  DO NOT EXCEED A TOTAL OF 3 DOSES IN 15 MINUTES 04/29/13   Vernie Shanks, MD  Polyethyl Glycol-Propyl Glycol (SYSTANE OP) Place 1 drop into both eyes 2 (two) times daily.    Historical Provider, MD  pravastatin (PRAVACHOL) 40 MG tablet Take 40 mg by mouth daily.    Historical Provider, MD  rivaroxaban (XARELTO) 20 MG TABS Take 1 tablet (20 mg total) by mouth daily with supper. 04/28/13   Nimish Luther Parody, MD  tamsulosin (FLOMAX) 0.4 MG CAPS Take 0.4 mg by mouth daily.    Historical Provider, MD  tiotropium (SPIRIVA) 18 MCG inhalation capsule Place 18 mcg into inhaler and inhale as needed.      Historical Provider, MD  zolpidem (AMBIEN) 10 MG tablet TAKE ONE TABLET BY MOUTH AT BEDTIME AS NEEDED FOR  SLEEP 05/26/13   Vernie Shanks, MD     ROS: As above in the HPI. All other systems are stable or negative.  OBJECTIVE: APPEARANCE:  Patient in no acute distress.The patient appeared well nourished and normally developed. Acyanotic. Waist: VITAL SIGNS:BP 162/87  Pulse 82  Temp(Src) 97.5 F (36.4 C) (Oral)  Ht 5' 9.5" (1.765 m)  Wt 165 lb  3.2 oz (74.934 kg)  BMI 24.05 kg/m2  WM  SKIN: warm and  Dry without overt rashes, tattoos and scars  HEAD and Neck: without JVD, Head and scalp: normal Eyes:No scleral icterus. Fundi normal, eye movements normal. Ears: Auricle normal, canal normal, Tympanic membranes normal, insufflation normal. Nose: normal Throat: normal Neck & thyroid: normal  CHEST & LUNGS: Chest wall: normal Lungs: Clear  CVS: Reveals the PMI to be normally located. controlled rhythm, First and Second Heart sounds are normal,  absence of murmurs, rubs or gallops. Peripheral vasculature: Radial pulses: normal Dorsal pedis pulses: normal Posterior pulses: normal  ABDOMEN:  Appearance: normal Benign, no organomegaly, no masses, no Abdominal Aortic enlargement. No Guarding , no rebound. No Bruits. Bowel sounds: normal  RECTAL: N/A GU: N/A  EXTREMETIES: nonedematous.  MUSCULOSKELETAL:  Spine: normal Joints: left thumb IP joint tenderness with pressure. Flexor tendon tender  NEUROLOGIC: oriented to time,place and person; nonfocal. Strength is normal Sensory is normal Reflexes are normal Cranial Nerves are normal.  ASSESSMENT: Hypertension - Plan: CMP14+EGFR  Atrial fibrillation - Plan: CMP14+EGFR, Ambulatory referral to Cardiology  Atrial flutter with rapid ventricular response  COPD (chronic obstructive pulmonary disease)  Hernia  Tenosynovitis of thumb - Plan: diclofenac sodium (VOLTAREN) 1 % GEL  Tobacco abuse  Rectal bleeding - Plan: POCT CBC, Fecal occult blood, imunochemical, Ambulatory referral to Gastroenterology  HLD (hyperlipidemia) - Plan: NMR,  lipoprofile  PLAN: Discussed with him anticoagulation, patient refused.taking only aspirin. Needs evaluation of his rectal bleeding episodes with Xarelto. Risks of stroke discussed with patient.   Orders Placed This Encounter  Procedures  . Fecal occult blood, imunochemical  . CMP14+EGFR  . NMR, lipoprofile  . Ambulatory referral to Cardiology    Referral Priority:  Routine    Referral Type:  Consultation    Referral Reason:  Specialty Services Required    Requested Specialty:  Cardiology    Number of Visits Requested:  1  . Ambulatory referral to Gastroenterology    Referral Priority:  Routine    Referral Type:  Consultation    Referral Reason:  Specialty Services Required    Requested Specialty:  Gastroenterology    Number of Visits Requested:  1  . POCT CBC   Meds ordered this encounter  Medications  . diclofenac sodium (VOLTAREN) 1 % GEL    Sig: Apply 2 g topically 4 (four) times daily.    Dispense:  1 Tube    Refill:  1   Medications Discontinued During This Encounter  Medication Reason  . rivaroxaban (XARELTO) 20 MG TABS Patient has not taken in last 30 days   Return in about 2 months (around 12/17/2013) for Recheck medical problems.  Cotina Freedman P. Jacelyn Grip, M.D.

## 2013-10-17 ENCOUNTER — Encounter: Payer: Self-pay | Admitting: Internal Medicine

## 2013-10-17 ENCOUNTER — Other Ambulatory Visit (INDEPENDENT_AMBULATORY_CARE_PROVIDER_SITE_OTHER): Payer: Medicare Other

## 2013-10-17 DIAGNOSIS — Z1212 Encounter for screening for malignant neoplasm of rectum: Secondary | ICD-10-CM

## 2013-10-17 NOTE — Progress Notes (Signed)
Pt dropped off FOBT only 

## 2013-10-18 ENCOUNTER — Other Ambulatory Visit: Payer: Self-pay | Admitting: Family Medicine

## 2013-10-18 DIAGNOSIS — E785 Hyperlipidemia, unspecified: Secondary | ICD-10-CM

## 2013-10-18 LAB — CMP14+EGFR
ALT: 15 IU/L (ref 0–44)
AST: 17 IU/L (ref 0–40)
Albumin/Globulin Ratio: 2.2 (ref 1.1–2.5)
Albumin: 4.1 g/dL (ref 3.5–4.8)
Alkaline Phosphatase: 75 IU/L (ref 39–117)
BUN/Creatinine Ratio: 19 (ref 10–22)
BUN: 26 mg/dL (ref 8–27)
CO2: 23 mmol/L (ref 18–29)
Calcium: 9.7 mg/dL (ref 8.6–10.2)
Chloride: 101 mmol/L (ref 97–108)
Creatinine, Ser: 1.36 mg/dL — ABNORMAL HIGH (ref 0.76–1.27)
GFR calc Af Amer: 59 mL/min/{1.73_m2} — ABNORMAL LOW (ref >59)
GFR calc non Af Amer: 51 mL/min/{1.73_m2} — ABNORMAL LOW (ref >59)
Globulin, Total: 1.9 g/dL (ref 1.5–4.5)
Glucose: 82 mg/dL (ref 65–99)
Potassium: 5.1 mmol/L (ref 3.5–5.2)
Sodium: 140 mmol/L (ref 134–144)
Total Bilirubin: 0.4 mg/dL (ref 0.0–1.2)
Total Protein: 6 g/dL (ref 6.0–8.5)

## 2013-10-18 LAB — NMR, LIPOPROFILE
Cholesterol: 160 mg/dL (ref <200)
HDL Cholesterol by NMR: 35 mg/dL — ABNORMAL LOW (ref >=40)
HDL Particle Number: 29.3 umol/L — ABNORMAL LOW (ref >=30.5)
LDL Particle Number: 1619 nmol/L — ABNORMAL HIGH (ref <1000)
LDL Size: 20.4 nm — ABNORMAL LOW (ref >20.5)
LDLC SERPL CALC-MCNC: 90 mg/dL (ref <100)
LP-IR Score: 68 — ABNORMAL HIGH (ref <=45)
Small LDL Particle Number: 1036 nmol/L — ABNORMAL HIGH (ref <=527)
Triglycerides by NMR: 176 mg/dL — ABNORMAL HIGH (ref <150)

## 2013-10-18 MED ORDER — PRAVASTATIN SODIUM 80 MG PO TABS: 80.0000 mg | ORAL_TABLET | Freq: Every day | ORAL | Status: DC

## 2013-10-19 ENCOUNTER — Other Ambulatory Visit: Payer: Self-pay | Admitting: Family Medicine

## 2013-10-19 DIAGNOSIS — K625 Hemorrhage of anus and rectum: Secondary | ICD-10-CM

## 2013-10-19 LAB — FECAL OCCULT BLOOD, IMMUNOCHEMICAL: Fecal Occult Bld: POSITIVE — AB

## 2013-10-19 NOTE — Progress Notes (Signed)
Quick Note:  Call Patient Labs that are abnormal: Has blood in the stool. He was having bleeding from the xarelto.  The rest are at goal  Recommendations: He needs to have a GI evaluation to ensure that the bleeding is not due to a serious cause even though we know it was related to Xarelto. Ordered in EPIC   ______

## 2013-10-27 ENCOUNTER — Other Ambulatory Visit: Payer: Self-pay | Admitting: Family Medicine

## 2013-11-26 ENCOUNTER — Ambulatory Visit: Payer: Medicare Other | Admitting: Internal Medicine

## 2013-11-27 ENCOUNTER — Other Ambulatory Visit: Payer: Self-pay | Admitting: Family Medicine

## 2013-12-01 NOTE — Telephone Encounter (Signed)
Rx called into [facility]

## 2013-12-01 NOTE — Telephone Encounter (Signed)
Rx ready for nurse to Phone in. 

## 2013-12-02 ENCOUNTER — Telehealth: Payer: Self-pay | Admitting: Family Medicine

## 2013-12-02 NOTE — Telephone Encounter (Signed)
Was done on 11/27/2013

## 2013-12-15 ENCOUNTER — Other Ambulatory Visit: Payer: Self-pay

## 2013-12-15 ENCOUNTER — Telehealth: Payer: Self-pay

## 2013-12-15 DIAGNOSIS — E785 Hyperlipidemia, unspecified: Secondary | ICD-10-CM

## 2013-12-15 MED ORDER — TIOTROPIUM BROMIDE MONOHYDRATE 18 MCG IN CAPS: 18.0000 ug | ORAL_CAPSULE | RESPIRATORY_TRACT | Status: DC | PRN

## 2013-12-15 MED ORDER — CLONAZEPAM 0.5 MG PO TABS: 0.5000 mg | ORAL_TABLET | Freq: Every day | ORAL | Status: DC

## 2013-12-15 MED ORDER — PRAVASTATIN SODIUM 80 MG PO TABS: 80.0000 mg | ORAL_TABLET | Freq: Every day | ORAL | Status: DC

## 2013-12-15 MED ORDER — DIGOXIN 250 MCG PO TABS: 0.2500 mg | ORAL_TABLET | Freq: Every day | ORAL | Status: DC

## 2013-12-15 NOTE — Telephone Encounter (Signed)
Clarifications :  Pt's clonazepam was not called to [facility]  Written rx for pt to pick up and pt aware

## 2013-12-15 NOTE — Telephone Encounter (Signed)
Rx ready for nurse to Phone in.I don't Rx 90 days of clonazepam. Needs to be 30 days with a refill. Adjustment made to RX.

## 2013-12-15 NOTE — Telephone Encounter (Signed)
Last seen 10/16/13  [facility]   Requesting 90 day supply of Klonopin

## 2013-12-15 NOTE — Telephone Encounter (Signed)
Pt notified that all rx's sent to [facility] excpet clonazepam which was called to [facility] for 30 days

## 2013-12-15 NOTE — Telephone Encounter (Signed)
Pt notiifed rx sent to [facility] except clonazepam  Written rx at front office.

## 2013-12-25 ENCOUNTER — Telehealth: Payer: Self-pay | Admitting: Family Medicine

## 2013-12-25 NOTE — Telephone Encounter (Signed)
Pt aware no samples available .

## 2014-01-01 ENCOUNTER — Encounter: Payer: Self-pay | Admitting: Internal Medicine

## 2014-01-01 ENCOUNTER — Ambulatory Visit (INDEPENDENT_AMBULATORY_CARE_PROVIDER_SITE_OTHER): Payer: Medicare PPO | Admitting: Internal Medicine

## 2014-01-01 VITALS — BP 130/78 | HR 72 | Ht 67.5 in | Wt 169.2 lb

## 2014-01-01 DIAGNOSIS — K625 Hemorrhage of anus and rectum: Secondary | ICD-10-CM

## 2014-01-01 NOTE — Assessment & Plan Note (Signed)
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 Colonoscopy is appropriate. The risks and benefits as well as alternatives of endoscopic procedure(s) have been discussed and reviewed. All questions answered. The patient agrees to proceed.  He wants to talk to his stepson about helping with transport, etc so has delayed scheduli ng.

## 2014-01-01 NOTE — Patient Instructions (Addendum)
You have been given a separate informational sheet regarding your tobacco use, the importance of quitting and local resources to help you quit.  We are going to obtain your 2009 colonoscopy that Dr. Earle Gell did for Dr. Carlean Purl to review.   We will speak with your family member about when they would be able to bring you for a colonoscopy.  You will then be set up for a pre-visit appointment with the nurse to be instructed.   I appreciate the opportunity to care for you.

## 2014-01-01 NOTE — Progress Notes (Addendum)
Subjective:    Patient ID: Don [name], male    DOB: 1941-04-14, 73 y.o.   MRN: [id number]  HPI The patient is a pleasant elderly white man with a history of colon polyps, and a history of prostatectomy and radiation after that for prostate cancer, with intermittent rectal bleeding when he was placed on Xarelto recently. He was in the hospital at [facility] and was found the major fibrillation was started on Xarelto late last year. He began to have rectal bleeding, something has had in the past, so because of his bright red blood per rectum in small amounts associated with the initiation of Xarelto, he stopped the medication. Her bleeding is almost gone though if he has frequent bowel movements will see some small amounts of bright red blood on the toilet paper. He has intermittent rectal pain for years after having had a perianal abscess removed. He had colonoscopy in the past in 03 were 2 diminutive adenomas removed, no polyps 2005. Colonoscopy report described as a scarred rectum. He reports having had a colonoscopy in 2009 for followup, results not known to request those records. He had a review of systems is otherwise negative except for occasional constipation. Allergies  Allergen Reactions  . Wellbutrin [Bupropion] Anxiety   Outpatient Prescriptions Prior to Visit  Medication Sig Dispense Refill  . beta carotene w/minerals (OCUVITE) tablet Take 1 tablet by mouth daily.      . budesonide-formoterol (SYMBICORT) 160-4.5 MCG/ACT inhaler Inhale 2 puffs into the lungs 2 (two) times daily as needed.  1 Inhaler  2  . clonazePAM (KLONOPIN) 0.5 MG tablet Take 1 tablet (0.5 mg total) by mouth daily.  30 tablet  1  . diclofenac sodium (VOLTAREN) 1 % GEL Apply 2 g topically 4 (four) times daily.  1 Tube  1  . digoxin (LANOXIN) 0.25 MG tablet Take 1 tablet (0.25 mg total) by mouth daily.  90 tablet  1  . DULoxetine (CYMBALTA) 60 MG capsule TAKE ONE CAPSULE BY MOUTH ONCE DAILY  30 capsule  2    . fish oil-omega-3 fatty acids 1000 MG capsule Take 2 g by mouth 2 (two) times daily.       . fluorouracil (EFUDEX) 5 % cream Apply 1 application topically daily as needed.       . mometasone (ELOCON) 0.1 % cream Apply 1 application topically 2 (two) times daily as needed.       . Multiple Vitamin (MULTIVITAMIN WITH MINERALS) TABS Take 1 tablet by mouth daily.      [name] NITROSTAT 0.4 MG SL tablet DISSOLVE ONE TABLET UNDER THE TONGUE EVERY 5 MINUTES AS NEEDED FOR CHEST PAIN.  DO NOT EXCEED A TOTAL OF 3 DOSES IN 15 MINUTES  25 tablet  0  . Polyethyl Glycol-Propyl Glycol (SYSTANE OP) Place 1 drop into both eyes 2 (two) times daily.      . pravastatin (PRAVACHOL) 80 MG tablet Take 1 tablet (80 mg total) by mouth daily.  90 tablet  1  . tamsulosin (FLOMAX) 0.4 MG CAPS Take 0.4 mg by mouth daily.      [name] tiotropium (SPIRIVA) 18 MCG inhalation capsule Place 1 capsule (18 mcg total) into inhaler and inhale as needed.  90 capsule  1  . amLODipine (NORVASC) 10 MG tablet Take 10 mg by mouth daily.       [name] diltiazem (CARDIZEM CD) 240 MG 24 hr capsule Take 1 capsule (240 mg total) by  mouth daily.  30 capsule  0  . zolpidem (AMBIEN) 10 MG tablet TAKE ONE TABLET BY MOUTH AT BEDTIME AS NEEDED FOR  SLEEP  30 tablet  0   No facility-administered medications prior to visit.   Past Medical History  Diagnosis Date  . Hyperlipidemia   . Hypertension   . COPD (chronic obstructive pulmonary disease)   . Prostate cancer   . Tubular adenoma of colon 07/31/02, 11/18/03   Past Surgical History  Procedure Laterality Date  . 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 Anal abcess,hemorroids,    . Esophagogastroduodenoscopy  11/18/2003    Dr. Earle Gell  . Colonoscopy w/ biopsies  11/18/2003    Dr. Earle Gell  . Tonsillectomy     History   Social History  . Marital Status: Married    Spouse Name: N/A    Number of Children: 0  . Years of Education: N/A   Occupational History  . retired    Social History  Main Topics  . Smoking status: Current Every Day Smoker    Types: Cigarettes, Cigars  . Smokeless tobacco: Never Used  . Alcohol Use: No  . Drug Use: No  . Sexual Activity: None   Other Topics Concern  . 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 Married to his second wife, she is in a nursing home.   Family History  Problem Relation Age of Onset  . Diabetes Father   . Heart disease Father     MI  . Heart attack Mother   . Heart disease Brother   . Heart attack Father   . Lung cancer Brother   . Lung cancer Sister        Review of Systems + low back pain, lower back itching, eyeglasses All other ROS negative or as per HPI    Objective:   Physical Exam General:  Elderly, well-developed, well-nourished and in no acute distress Eyes:  anicteric. ENT:   Mouth and posterior pharynx free of lesions. edentulous Lungs: Clear to auscultation bilaterally. Heart:  S1S2, no rubs, murmurs, gallops. Irregular rhythm Abdomen:  soft, non-tender, no hepatosplenomegaly, hernia, or mass and BS+.  Rectal: Surgical Scar on left perianal area, mild perianal erythema, anal stenosis  Brown stool, mildly tender, no mass Lymph:  no cervical or supraclavicular adenopathy. Extremities:   no edema, + lower extremity varicosities Skin   mild papular erythematous lesions low back Neuro:  A&O x 3.  Psych:  appropriate mood and  Affect.   Data Reviewed: As per history of present illness Lab Results  Component Value Date   WBC 7.0 10/16/2013   HGB 14.5 10/16/2013   HCT 45.2 10/16/2013   MCV 97.4* 10/16/2013   PLT 236 04/28/2013          Assessment & Plan:   1. 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 Colonoscopy is appropriate. The risks and benefits as well as alternatives of endoscopic procedure(s) have been discussed and reviewed. All questions answered. The patient agrees to proceed.  He  wants to talk to his stepson about helping with transport, etc so has delayed scheduli ng. He is currently off Xarelto though may need some sort of stroke prevention - I did explain he has a risk of stroke off anti-coagulation Tx but he does not want to resume at present.  I appreciate the opportunity to care for this patient  Gatha Mayer, MD, [facility]   12/07/2008 colonoscopy reported as normal - records reviewed

## 2014-01-26 ENCOUNTER — Telehealth: Payer: Self-pay | Admitting: Family Medicine

## 2014-01-26 NOTE — Telephone Encounter (Signed)
Patient aware.

## 2014-01-28 ENCOUNTER — Telehealth: Payer: Self-pay

## 2014-01-28 NOTE — Telephone Encounter (Signed)
Message copied by [place], Yashira Offenberger E on Wed Jan 28, 2014  4:43 PM ------      Message from: [place], Anthonny Schiller E      Created: Thu Jan 01, 2014  9:48 AM       Did his step son call back and set up a pre-visit and colon appt.? ------

## 2014-01-28 NOTE — Telephone Encounter (Signed)
Spoke to patient because at his February Dr. Carlean Purl visit the plan was to set up a pre-visit and colonoscopy time after he spoke to his step son.  After touching base with Jeramyah he states he is awaiting [employer] that should come soon. I told him to please call us back as soon as he hears from them.

## 2014-02-26 ENCOUNTER — Other Ambulatory Visit: Payer: Self-pay | Admitting: Family Medicine

## 2014-02-26 ENCOUNTER — Telehealth: Payer: Self-pay | Admitting: Family Medicine

## 2014-02-26 DIAGNOSIS — F411 Generalized anxiety disorder: Secondary | ICD-10-CM

## 2014-02-26 DIAGNOSIS — I4891 Unspecified atrial fibrillation: Secondary | ICD-10-CM

## 2014-02-26 MED ORDER — CLONAZEPAM 0.5 MG PO TABS: 0.5000 mg | ORAL_TABLET | Freq: Every day | ORAL | Status: DC

## 2014-02-26 MED ORDER — RIVAROXABAN 20 MG PO TABS
20.0000 mg | ORAL_TABLET | Freq: Every day | ORAL | Status: DC
Start: 2014-02-26 — End: 2014-04-03

## 2014-02-26 NOTE — Telephone Encounter (Signed)
Call patient : Prescription refilled & sent to pharmacy in [place]. 

## 2014-02-26 NOTE — Telephone Encounter (Signed)
Pt aware rx sent in to his pharmacy

## 2014-02-26 NOTE — Telephone Encounter (Signed)
Needs to be seen to get checked to see if he is having any blood loss ASAP before restarting the Xarelto.

## 2014-02-26 NOTE — Telephone Encounter (Signed)
Patient wanted to know if he needed to go back on xarelto. He has been off of it for several months. Reviewing chart he was seen by GI in feb and note stated that he was currently not taking xarelto and that patient did not want to be on anything at that time and was advised that he may need something for his atrial fib. Patient was advised today that he would need to be seen to discuss going back on xarelto but patient did not want to schedule and that he stated he would call back. Patient was notified of the importance of getting in here to discuss getting back on the correct medications.

## 2014-02-26 NOTE — Telephone Encounter (Signed)
Pt notified and per Dr Jacelyn Grip he went ahead and filled his rx but strongly advises office visit soon

## 2014-02-26 NOTE — Telephone Encounter (Signed)
Pt refused to make appt. Advised he really needs to be seen ASAP and pt refused.

## 2014-03-13 ENCOUNTER — Telehealth: Payer: Self-pay | Admitting: Family Medicine

## 2014-03-13 NOTE — Telephone Encounter (Signed)
No samples at this time patient aware  

## 2014-03-16 ENCOUNTER — Ambulatory Visit (INDEPENDENT_AMBULATORY_CARE_PROVIDER_SITE_OTHER): Payer: Medicare HMO | Admitting: Family Medicine

## 2014-03-16 ENCOUNTER — Ambulatory Visit (INDEPENDENT_AMBULATORY_CARE_PROVIDER_SITE_OTHER): Payer: Medicare HMO

## 2014-03-16 ENCOUNTER — Encounter: Payer: Self-pay | Admitting: Family Medicine

## 2014-03-16 VITALS — BP 152/83 | HR 101 | Temp 97.0°F | Ht 67.5 in | Wt 171.2 lb

## 2014-03-16 DIAGNOSIS — J209 Acute bronchitis, unspecified: Secondary | ICD-10-CM

## 2014-03-16 DIAGNOSIS — R059 Cough, unspecified: Secondary | ICD-10-CM

## 2014-03-16 DIAGNOSIS — R05 Cough: Secondary | ICD-10-CM

## 2014-03-16 MED ORDER — AMOXICILLIN 875 MG PO TABS: 875.0000 mg | ORAL_TABLET | Freq: Two times a day (BID) | ORAL | Status: DC

## 2014-03-16 MED ORDER — METHYLPREDNISOLONE (PAK) 4 MG PO TABS: ORAL_TABLET | ORAL | Status: DC

## 2014-03-16 MED ORDER — ALBUTEROL SULFATE HFA 108 (90 BASE) MCG/ACT IN AERS: 2.0000 | INHALATION_SPRAY | Freq: Four times a day (QID) | RESPIRATORY_TRACT | Status: DC | PRN

## 2014-03-16 NOTE — Progress Notes (Signed)
   Subjective:    Patient ID: TRAEGER SULTANA, male    DOB: 02-02-1941, 73 y.o.   MRN: [id number]  HPI This 73 y.o. male presents for evaluation of c/o cough and congestion.   Review of Systems No chest pain, SOB, HA, dizziness, vision change, N/V, diarrhea, constipation, dysuria, urinary urgency or frequency, myalgias, arthralgias or rash.     Objective:   Physical Exam  Vital signs noted  Well developed well nourished male.  HEENT - Head atraumatic Normocephalic                Eyes - PERRLA, Conjuctiva - clear Sclera- Clear EOMI                Ears - EAC's Wnl TM's Wnl Gross Hearing WNL                Nose - Nares patent                 Throat - oropharanx wnl Respiratory - Lungs CTA bilateral Cardiac - RRR S1 and S2 without murmur GI - Abdomen soft Nontender and bowel sounds active x 4 Extremities - No edema. Neuro - Grossly intact.  cxr - No infiltrate Prelimnary reading by Iverson Alamin    Assessment & Plan:  Cough - Plan: DG Chest 2 View Amoxicillin 875mg  po bid x 10 days, medrol dose pack as directed, and take otc cough medicine Push po fluids, rest, tylenol and motrin otc prn as directed for fever, arthralgias, and myalgias.  Follow up prn if sx's continue or persist.  Lysbeth Penner FNP

## 2014-03-17 ENCOUNTER — Other Ambulatory Visit: Payer: Self-pay | Admitting: Family Medicine

## 2014-03-17 DIAGNOSIS — R918 Other nonspecific abnormal finding of lung field: Secondary | ICD-10-CM

## 2014-03-19 ENCOUNTER — Telehealth: Payer: Self-pay | Admitting: *Deleted

## 2014-03-19 NOTE — Telephone Encounter (Signed)
Pt notified of CXR results Verbalizes understanding

## 2014-03-20 ENCOUNTER — Ambulatory Visit (HOSPITAL_COMMUNITY)
Admission: RE | Admit: 2014-03-20 | Discharge: 2014-03-20 | Disposition: A | Discharge status: Home / Self Care | Payer: Medicare HMO | Source: Ambulatory Visit | Attending: Family Medicine | Admitting: Family Medicine

## 2014-03-20 DIAGNOSIS — J479 Bronchiectasis, uncomplicated: Secondary | ICD-10-CM | POA: Insufficient documentation

## 2014-03-20 DIAGNOSIS — J438 Other emphysema: Secondary | ICD-10-CM | POA: Insufficient documentation

## 2014-03-20 DIAGNOSIS — R918 Other nonspecific abnormal finding of lung field: Secondary | ICD-10-CM

## 2014-03-20 DIAGNOSIS — J42 Unspecified chronic bronchitis: Secondary | ICD-10-CM | POA: Insufficient documentation

## 2014-03-20 LAB — POCT I-STAT, CHEM 8
BUN: 28 mg/dL — AB (ref 6–23)
CHLORIDE: 109 meq/L (ref 96–112)
CREATININE: 1.6 mg/dL — AB (ref 0.50–1.35)
Calcium, Ion: 1.2 mmol/L (ref 1.13–1.30)
Glucose, Bld: 123 mg/dL — ABNORMAL HIGH (ref 70–99)
HCT: 44 % (ref 39.0–52.0)
HEMOGLOBIN: 15 g/dL (ref 13.0–17.0)
POTASSIUM: 5 meq/L (ref 3.7–5.3)
SODIUM: 141 meq/L (ref 137–147)
TCO2: 23 mmol/L (ref 0–100)

## 2014-03-20 MED ORDER — IOHEXOL 300 MG/ML  SOLN
80.0000 mL | Freq: Once | INTRAMUSCULAR | Status: AC | PRN
  Administered 2014-03-20: 64 mL via INTRAVENOUS

## 2014-03-23 ENCOUNTER — Other Ambulatory Visit: Payer: Self-pay | Admitting: Family Medicine

## 2014-03-23 ENCOUNTER — Telehealth: Payer: Self-pay | Admitting: Family Medicine

## 2014-03-23 NOTE — Telephone Encounter (Signed)
CT of chest shows no mass or nodular density.  He has emphysema but no lung mass noted on CT of chest

## 2014-03-23 NOTE — Telephone Encounter (Signed)
Please review Ct from [place] for patient.

## 2014-03-23 NOTE — Telephone Encounter (Signed)
Patient is still having trouble breathing should he follow up with you?

## 2014-03-25 NOTE — Telephone Encounter (Signed)
CT showed no lung mass

## 2014-04-03 ENCOUNTER — Emergency Department (HOSPITAL_COMMUNITY): Payer: Medicare HMO

## 2014-04-03 ENCOUNTER — Inpatient Hospital Stay (HOSPITAL_COMMUNITY)
Admission: EM | Admit: 2014-04-03 | Discharge: 2014-04-09 | DRG: 309 | Disposition: A | Discharge status: Home Health | Payer: Medicare HMO | Attending: Internal Medicine | Admitting: Internal Medicine

## 2014-04-03 ENCOUNTER — Encounter (HOSPITAL_COMMUNITY): Payer: Self-pay | Admitting: Emergency Medicine

## 2014-04-03 DIAGNOSIS — M545 Low back pain, unspecified: Secondary | ICD-10-CM | POA: Diagnosis present

## 2014-04-03 DIAGNOSIS — K449 Diaphragmatic hernia without obstruction or gangrene: Secondary | ICD-10-CM | POA: Diagnosis present

## 2014-04-03 DIAGNOSIS — I4891 Unspecified atrial fibrillation: Principal | ICD-10-CM

## 2014-04-03 DIAGNOSIS — N186 End stage renal disease: Secondary | ICD-10-CM

## 2014-04-03 DIAGNOSIS — Z79899 Other long term (current) drug therapy: Secondary | ICD-10-CM

## 2014-04-03 DIAGNOSIS — Z860100 Personal history of colon polyps, unspecified: Secondary | ICD-10-CM

## 2014-04-03 DIAGNOSIS — N183 Chronic kidney disease, stage 3 unspecified: Secondary | ICD-10-CM

## 2014-04-03 DIAGNOSIS — K648 Other hemorrhoids: Secondary | ICD-10-CM | POA: Diagnosis present

## 2014-04-03 DIAGNOSIS — E785 Hyperlipidemia, unspecified: Secondary | ICD-10-CM | POA: Diagnosis present

## 2014-04-03 DIAGNOSIS — IMO0002 Reserved for concepts with insufficient information to code with codable children: Secondary | ICD-10-CM

## 2014-04-03 DIAGNOSIS — Z9079 Acquired absence of other genital organ(s): Secondary | ICD-10-CM

## 2014-04-03 DIAGNOSIS — K922 Gastrointestinal hemorrhage, unspecified: Secondary | ICD-10-CM

## 2014-04-03 DIAGNOSIS — J209 Acute bronchitis, unspecified: Secondary | ICD-10-CM

## 2014-04-03 DIAGNOSIS — I248 Other forms of acute ischemic heart disease: Secondary | ICD-10-CM

## 2014-04-03 DIAGNOSIS — F17200 Nicotine dependence, unspecified, uncomplicated: Secondary | ICD-10-CM | POA: Diagnosis present

## 2014-04-03 DIAGNOSIS — Z833 Family history of diabetes mellitus: Secondary | ICD-10-CM

## 2014-04-03 DIAGNOSIS — I10 Essential (primary) hypertension: Secondary | ICD-10-CM

## 2014-04-03 DIAGNOSIS — I2489 Other forms of acute ischemic heart disease: Secondary | ICD-10-CM

## 2014-04-03 DIAGNOSIS — Z801 Family history of malignant neoplasm of trachea, bronchus and lung: Secondary | ICD-10-CM

## 2014-04-03 DIAGNOSIS — R748 Abnormal levels of other serum enzymes: Secondary | ICD-10-CM | POA: Diagnosis present

## 2014-04-03 DIAGNOSIS — R7989 Other specified abnormal findings of blood chemistry: Secondary | ICD-10-CM

## 2014-04-03 DIAGNOSIS — D126 Benign neoplasm of colon, unspecified: Secondary | ICD-10-CM

## 2014-04-03 DIAGNOSIS — Z7982 Long term (current) use of aspirin: Secondary | ICD-10-CM

## 2014-04-03 DIAGNOSIS — Z720 Tobacco use: Secondary | ICD-10-CM | POA: Diagnosis present

## 2014-04-03 DIAGNOSIS — I509 Heart failure, unspecified: Secondary | ICD-10-CM

## 2014-04-03 DIAGNOSIS — Z91199 Patient's noncompliance with other medical treatment and regimen due to unspecified reason: Secondary | ICD-10-CM

## 2014-04-03 DIAGNOSIS — J449 Chronic obstructive pulmonary disease, unspecified: Secondary | ICD-10-CM

## 2014-04-03 DIAGNOSIS — K2270 Barrett's esophagus without dysplasia: Secondary | ICD-10-CM

## 2014-04-03 DIAGNOSIS — K625 Hemorrhage of anus and rectum: Secondary | ICD-10-CM

## 2014-04-03 DIAGNOSIS — R195 Other fecal abnormalities: Secondary | ICD-10-CM

## 2014-04-03 DIAGNOSIS — R778 Other specified abnormalities of plasma proteins: Secondary | ICD-10-CM | POA: Diagnosis present

## 2014-04-03 DIAGNOSIS — Z91148 Patient's other noncompliance with medication regimen for other reason: Secondary | ICD-10-CM

## 2014-04-03 DIAGNOSIS — I214 Non-ST elevation (NSTEMI) myocardial infarction: Secondary | ICD-10-CM

## 2014-04-03 DIAGNOSIS — J440 Chronic obstructive pulmonary disease with acute lower respiratory infection: Secondary | ICD-10-CM | POA: Diagnosis present

## 2014-04-03 DIAGNOSIS — Z8546 Personal history of malignant neoplasm of prostate: Secondary | ICD-10-CM

## 2014-04-03 DIAGNOSIS — I129 Hypertensive chronic kidney disease with stage 1 through stage 4 chronic kidney disease, or unspecified chronic kidney disease: Secondary | ICD-10-CM | POA: Diagnosis present

## 2014-04-03 DIAGNOSIS — J441 Chronic obstructive pulmonary disease with (acute) exacerbation: Secondary | ICD-10-CM

## 2014-04-03 DIAGNOSIS — Z9119 Patient's noncompliance with other medical treatment and regimen: Secondary | ICD-10-CM

## 2014-04-03 DIAGNOSIS — Z9114 Patient's other noncompliance with medication regimen: Secondary | ICD-10-CM

## 2014-04-03 DIAGNOSIS — Z7901 Long term (current) use of anticoagulants: Secondary | ICD-10-CM

## 2014-04-03 DIAGNOSIS — Z8249 Family history of ischemic heart disease and other diseases of the circulatory system: Secondary | ICD-10-CM

## 2014-04-03 HISTORY — DX: Patient's other noncompliance with medication regimen: Z91.14

## 2014-04-03 HISTORY — DX: Chronic kidney disease, unspecified: N18.9

## 2014-04-03 HISTORY — DX: End stage renal disease: N18.6

## 2014-04-03 HISTORY — DX: Unspecified atrial fibrillation: I48.91

## 2014-04-03 HISTORY — DX: Acute bronchitis, unspecified: J20.9

## 2014-04-03 LAB — BASIC METABOLIC PANEL
BUN: 24 mg/dL — ABNORMAL HIGH (ref 6–23)
CALCIUM: 9.2 mg/dL (ref 8.4–10.5)
CHLORIDE: 101 meq/L (ref 96–112)
CO2: 25 meq/L (ref 19–32)
Creatinine, Ser: 1.47 mg/dL — ABNORMAL HIGH (ref 0.50–1.35)
GFR calc Af Amer: 52 mL/min — ABNORMAL LOW (ref >90)
GFR calc non Af Amer: 45 mL/min — ABNORMAL LOW (ref >90)
Glucose, Bld: 95 mg/dL (ref 70–99)
POTASSIUM: 5.1 meq/L (ref 3.7–5.3)
SODIUM: 138 meq/L (ref 137–147)

## 2014-04-03 LAB — CBC
HEMATOCRIT: 42.7 % (ref 39.0–52.0)
Hemoglobin: 14.2 g/dL (ref 13.0–17.0)
MCH: 32.1 pg (ref 26.0–34.0)
MCHC: 33.3 g/dL (ref 30.0–36.0)
MCV: 96.4 fL (ref 78.0–100.0)
Platelets: 258 10*3/uL (ref 150–400)
RBC: 4.43 MIL/uL (ref 4.22–5.81)
RDW: 13.8 % (ref 11.5–15.5)
WBC: 13.3 10*3/uL — AB (ref 4.0–10.5)

## 2014-04-03 LAB — URINALYSIS, ROUTINE W REFLEX MICROSCOPIC
Bilirubin Urine: NEGATIVE
GLUCOSE, UA: NEGATIVE mg/dL
Ketones, ur: NEGATIVE mg/dL
LEUKOCYTES UA: NEGATIVE
Nitrite: NEGATIVE
PH: 5.5 (ref 5.0–8.0)
PROTEIN: 100 mg/dL — AB
Specific Gravity, Urine: 1.025 (ref 1.005–1.030)
Urobilinogen, UA: 0.2 mg/dL (ref 0.0–1.0)

## 2014-04-03 LAB — PRO B NATRIURETIC PEPTIDE: Pro B Natriuretic peptide (BNP): 3436 pg/mL — ABNORMAL HIGH (ref 0–125)

## 2014-04-03 LAB — URINE MICROSCOPIC-ADD ON

## 2014-04-03 LAB — TROPONIN I: Troponin I: 0.4 ng/mL (ref <0.30)

## 2014-04-03 LAB — DIGOXIN LEVEL: DIGOXIN LVL: 0.4 ng/mL — AB (ref 0.8–2.0)

## 2014-04-03 MED ORDER — LEVALBUTEROL HCL 0.63 MG/3ML IN NEBU
0.6300 mg | INHALATION_SOLUTION | Freq: Once | RESPIRATORY_TRACT | Status: AC
  Administered 2014-04-03: 0.63 mg via RESPIRATORY_TRACT
  Filled 2014-04-03: qty 3

## 2014-04-03 MED ORDER — IPRATROPIUM BROMIDE 0.02 % IN SOLN
0.5000 mg | Freq: Once | RESPIRATORY_TRACT | Status: AC
  Administered 2014-04-03: 0.5 mg via RESPIRATORY_TRACT
  Filled 2014-04-03: qty 2.5

## 2014-04-03 MED ORDER — DILTIAZEM HCL 100 MG IV SOLR
5.0000 mg/h | INTRAVENOUS | Status: DC
  Administered 2014-04-03: 5 mg/h via INTRAVENOUS
  Administered 2014-04-04: 10 mg/h via INTRAVENOUS
  Filled 2014-04-03: qty 100

## 2014-04-03 MED ORDER — DILTIAZEM HCL 25 MG/5ML IV SOLN
15.0000 mg | Freq: Once | INTRAVENOUS | Status: AC
  Administered 2014-04-03: 15 mg via INTRAVENOUS

## 2014-04-03 MED ORDER — METHYLPREDNISOLONE SODIUM SUCC 125 MG IJ SOLR
60.0000 mg | Freq: Four times a day (QID) | INTRAMUSCULAR | Status: DC
  Administered 2014-04-03 – 2014-04-04 (×3): 60 mg via INTRAVENOUS
  Filled 2014-04-03: qty 0.96
  Filled 2014-04-03: qty 2
  Filled 2014-04-03 (×4): qty 0.96

## 2014-04-03 MED ORDER — LEVOFLOXACIN IN D5W 750 MG/150ML IV SOLN
750.0000 mg | Freq: Once | INTRAVENOUS | Status: AC
  Administered 2014-04-03: 750 mg via INTRAVENOUS
  Filled 2014-04-03: qty 150

## 2014-04-03 MED ORDER — FUROSEMIDE 10 MG/ML IJ SOLN
40.0000 mg | Freq: Once | INTRAMUSCULAR | Status: AC
  Administered 2014-04-03: 40 mg via INTRAVENOUS
  Filled 2014-04-03: qty 4

## 2014-04-03 MED ORDER — DILTIAZEM HCL 100 MG IV SOLR
10.0000 mg/h | Freq: Once | INTRAVENOUS | Status: AC
  Administered 2014-04-03: 10 mg/h via INTRAVENOUS
  Filled 2014-04-03: qty 100

## 2014-04-03 MED ORDER — DILTIAZEM LOAD VIA INFUSION
10.0000 mg | Freq: Once | INTRAVENOUS | Status: AC
  Administered 2014-04-03: 10 mg via INTRAVENOUS
  Filled 2014-04-03: qty 10

## 2014-04-03 NOTE — ED Notes (Signed)
CRITICAL VALUE ALERT  Critical value received: Trop 0.40 Date of notification:  04/03/14 Time of notification:  2221  Critical value read back:  yes Nurse who received alert:  Kayleen Memos, RN  MD notified:  Dr. Thurnell Garbe  Time:  2221

## 2014-04-03 NOTE — ED Notes (Signed)
[facility] en route to [facility]

## 2014-04-03 NOTE — ED Notes (Signed)
Urinal given to pt at [facility].

## 2014-04-03 NOTE — H&P (Signed)
[facility] History and Physical  Don [name] MGQ:[id number] DOB: [birth year] DOA: 04/03/2014   PCP: Don [name]. 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 Specialists: Cardiology and gastroenterology.   Chief Complaint: Cough, shortness of breath for last 2 days  HPI: Don [name] is a 73 y.o. male with a past medical history of atrial fibrillation noncompliant with this treatment, COPD, but continues to smoke, hypertension who was in his usual state of health till about yesterday evening when he started developing a cough, along with wheezing. He has a whitish expectoration. Denies any blood in the sputum. Has had a few dizzy spells, but denies any syncopal episodes. Did have some chest discomfort, especially with coughing and deep breathing, but none currently.  He underwent a CT scan of his chest on 03/20/14 for similar symptoms and this was negative for pulmonary embolism. Denies any palpitations. Had some nausea, but no vomiting. Has been short of breath. Denies using oxygen at home. He is overall not very good historian. He's had atrial fibrillation for the last year or so. He was initiated on digoxin along with diltiazem. However, patient is not compliant with his medications. He was also started on Rivaroxaban for anticoagulation. But a few months ago he started having rectal bleeding. So, he stopped taking this medication. He went to see a gastroenterologist in February and the plan was to do a colonoscopy, but patient hasn't had that done, due to financial reasons. He hasn't seen any frank blood in the stool recently, but has noticed dark-colored stools periodically. Denies black stools however.  Home Medications: Prior to Admission medications   Medication Sig Start Date End Date Taking? Authorizing Provider  albuterol (PROVENTIL HFA;VENTOLIN HFA) 108 (90 BASE) MCG/ACT inhaler Inhale 2 puffs into the lungs every 6 (six) hours as needed for wheezing  or shortness of breath. 03/16/14  Yes Don Penner, FNP  aspirin 81 MG tablet Take 81 mg by mouth daily.   Yes Historical Provider, MD  beta carotene w/minerals (OCUVITE) tablet Take 1 tablet by mouth daily.   Yes Historical Provider, MD  clonazePAM (KLONOPIN) 0.5 MG tablet Take 1 tablet (0.5 mg total) by mouth daily. 02/26/14  Yes Don Shanks, MD  diclofenac sodium (VOLTAREN) 1 % GEL Apply 2 g topically 4 (four) times daily. 10/16/13  Yes Don Shanks, MD  digoxin (LANOXIN) 0.25 MG tablet Take 1 tablet (0.25 mg total) by mouth daily. 12/15/13  Yes Don Shanks, MD  DiphenhydrAMINE HCl, Sleep, (SLEEP-AID MAXIMUM STRENGTH) 50 MG CAPS Take 1 capsule by mouth at bedtime.   Yes Historical Provider, MD  fish oil-omega-3 fatty acids 1000 MG capsule Take 2 g by mouth 2 (two) times daily.    Yes Historical Provider, MD  Multiple Vitamin (MULTIVITAMIN WITH MINERALS) TABS Take 1 tablet by mouth daily.   Yes Historical Provider, MD  NITROSTAT 0.4 MG SL tablet DISSOLVE ONE TABLET UNDER THE TONGUE EVERY 5 MINUTES AS NEEDED FOR CHEST PAIN.  DO NOT EXCEED A TOTAL OF 3 DOSES IN 15 MINUTES 04/29/13  Yes Don Shanks, MD  Polyethyl Glycol-Propyl Glycol (SYSTANE OP) Place 1 drop into both eyes 2 (two) times daily.   Yes Historical Provider, MD  Umeclidinium-Vilanterol (ANORO ELLIPTA) 62.5-25 MCG/INH AEPB Inhale 1 puff into the lungs daily.   Yes Historical Provider, MD  pravastatin (PRAVACHOL) 80 MG tablet Take 1 tablet (80 mg total) by mouth daily. 12/15/13   Don Shanks, MD  tamsulosin (FLOMAX) 0.4 MG CAPS Take 0.4 mg by mouth daily.    Historical Provider, MD  tiotropium (SPIRIVA) 18 MCG inhalation capsule Place 1 capsule (18 mcg total) into inhaler and inhale as needed. 12/15/13   Don Shanks, MD    Allergies:  Allergies  Allergen Reactions  . Wellbutrin [Bupropion] Anxiety    Past Medical History: Past Medical History  Diagnosis Date  . Hyperlipidemia   . Hypertension   . COPD (chronic  obstructive pulmonary disease)   . Prostate cancer   . Tubular adenoma of colon 07/31/02, 11/18/03  . Atrial fibrillation   . Noncompliance with medications 04/2013    xarelto, digoxin  . Tobacco abuse     ongoing    Past Surgical History  Procedure Laterality Date  . 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 Anal abcess,hemorroids,    . Esophagogastroduodenoscopy  11/18/2003    Dr. Earle [name]  . Colonoscopy w/ biopsies  11/18/2003    Dr. Earle [name]  . Tonsillectomy      Social History: He lives in [place]. His wife is in a skilled nursing facility. Continues to smoke one pack of cigarettes on a daily basis. Has almost 50-60-pack-year history of smoking. No alcohol use. No illicit drug use. Independent with daily activities.  Family History:  Family History  Problem Relation Age of Onset  . Diabetes Father   . Heart disease Father     MI  . Heart attack Mother   . Heart disease Brother   . Heart attack Father   . Lung cancer Brother   . Lung cancer Sister      Review of Systems - History obtained from the patient General ROS: positive for  - fatigue Psychological ROS: negative Ophthalmic ROS: negative ENT ROS: negative Allergy and Immunology ROS: negative Hematological and Lymphatic ROS: negative Endocrine ROS: negative Respiratory ROS: as in hpi Cardiovascular ROS: as in hpi Gastrointestinal ROS: no abdominal pain, change in bowel habits, or black or bloody stools Genito-Urinary ROS: no dysuria, trouble voiding, or hematuria Musculoskeletal ROS: negative Neurological ROS: no TIA or stroke symptoms Dermatological ROS: negative  Physical Examination  Filed Vitals:   04/03/14 2125 04/03/14 2133 04/03/14 2145 04/03/14 2215  BP: 140/77  124/85 136/87  Pulse: 122 123 49 122  Temp:      TempSrc:      Resp: 32 _0 Height:      Weight:      SpO2: 94% 94% 98% 96%    BP 136/87  Pulse 122  Temp(Src) 99.2 F (37.3 C) (Oral)  Resp 29  Ht 5' 9.5"  (1.765 m)  Wt 79.379 kg (175 lb)  BMI 25.48 kg/m2  SpO2 96%  General appearance: alert, cooperative, appears stated age and no distress Head: Normocephalic, without obvious abnormality, atraumatic Eyes: conjunctivae/corneas clear. PERRL, EOM's intact.  Throat: lips, mucosa, and tongue normal; teeth and gums normal Resp: End expiratory wheezing bilaterally. No crackles. No rhonchi. Cardio: regular rate and rhythm, S1, S2 normal, no murmur, click, rub or gallop GI: soft, non-tender; bowel sounds normal; no masses,  no organomegaly Extremities: 1+ pitting edema noted in Bilateral lower extremities. Pulses: 2+ and symmetric Skin: Skin color, texture, turgor normal. No rashes or lesions Lymph nodes: Cervical, supraclavicular, and axillary nodes normal. Neurologic: He is alert and oriented x3. Cranial nerves intact. Motor strength is equal, bilateral upper and lower extremities. Gait not assessed.  Laboratory Data: Results for orders placed during the hospital encounter of 04/03/14 (  from the past 48 hour(s))  CBC     Status: Abnormal   Collection Time    04/03/14  9:05 PM      Result Value Ref Range   WBC 13.3 (*) 4.0 - 10.5 K/uL   RBC 4.43  4.22 - 5.81 MIL/uL   Hemoglobin 14.2  13.0 - 17.0 g/dL   HCT 42.7  39.0 - 52.0 %   MCV 96.4  78.0 - 100.0 fL   MCH 32.1  26.0 - 34.0 pg   MCHC 33.3  30.0 - 36.0 g/dL   RDW 13.8  11.5 - 15.5 %   Platelets 258  150 - 400 K/uL  PRO B NATRIURETIC PEPTIDE     Status: Abnormal   Collection Time    04/03/14  9:05 PM      Result Value Ref Range   Pro B Natriuretic peptide (BNP) 3436.0 (*) 0 - 125 pg/mL  TROPONIN I     Status: Abnormal   Collection Time    04/03/14  9:05 PM      Result Value Ref Range   Troponin I 0.40 (*) <0.30 ng/mL   Comment:            Due to the release kinetics of cTnI,     a negative result within the first hours     of the onset of symptoms does not rule out     myocardial infarction with certainty.     If myocardial  infarction is still suspected,     repeat the test at appropriate intervals.     CRITICAL RESULT CALLED TO, READ BACK BY AND VERIFIED WITH:     WINN,T ON 04/02/14 AT 2220 BY LOY,C  BASIC METABOLIC PANEL     Status: Abnormal   Collection Time    04/03/14  9:05 PM      Result Value Ref Range   Sodium 138  137 - 147 mEq/L   Potassium 5.1  3.7 - 5.3 mEq/L   Chloride 101  96 - 112 mEq/L   CO2 25  19 - 32 mEq/L   Glucose, Bld 95  70 - 99 mg/dL   BUN 24 (*) 6 - 23 mg/dL   Creatinine, Ser 1.47 (*) 0.50 - 1.35 mg/dL   Calcium 9.2  8.4 - 10.5 mg/dL   GFR calc non Af Amer 45 (*) >90 mL/min   GFR calc Af Amer 52 (*) >90 mL/min   Comment: (NOTE)     The eGFR has been calculated using the CKD EPI equation.     This calculation has not been validated in all clinical situations.     eGFR's persistently <90 mL/min signify possible Chronic Kidney     Disease.  DIGOXIN LEVEL     Status: Abnormal   Collection Time    04/03/14  9:05 PM      Result Value Ref Range   Digoxin Level 0.4 (*) 0.8 - 2.0 ng/mL    Radiology Reports: Dg Chest 2 View  04/03/2014   CLINICAL DATA:  Short of breath.  Cough.  EXAM: CHEST  2 VIEW  COMPARISON:  03/16/2014  FINDINGS: Cardiac silhouette is borderline enlarged. Normal mediastinal and hilar contours. Lungs are mildly hyperexpanded. There is mild scarring at the apices. No lung consolidation or edema. No pleural effusion or pneumothorax. Bony thorax is demineralized but intact.  IMPRESSION: No acute cardiopulmonary disease.   Electronically Signed   By: Lajean Manes M.D.   On: 04/03/2014 21:07  Electrocardiogram: EKG shows atrial fibrillation with rapid ventricular response at 166 beats per minute. Normal axis. Nonspecific ST changes probably related to the tachy-arrhythmia.  Problem List  Principal Problem:   Atrial fibrillation with RVR Active Problems:   COPD (chronic obstructive pulmonary disease)   Hypertension   Tobacco abuse   Demand ischemia   Heme  positive stool   Acute bronchitis   History of prostate cancer   CKD (chronic kidney disease)   Assessment: This is a 73 year old, Caucasian male, with the past medical history as stated earlier, who presents with one to two-day history of worsening cough, wheezing, near-syncopal episodes. He was found to be in atrial fibrillation with RVR when he presented to the emergency department. He appears to have acute bronchitis versus mild COPD exacerbation as well. He also was noted to have heme positive stools. He has mildly elevated troponin, which could be from demand ischemia.  Plan: #1 atrial fibrillation with RVR: He is on a Cardizem drip, which will be continued. Hopefully, with this his rate should remain controlled. His digoxin level was low. We'll hold his digoxin for now. Cardiology will be consulted. Anticoagulation issue will need to be resolved prior to discharge from the hospital. Since he does have lower extremity edema we will proceed with echocardiogram though he did have one in June of 2014. But, since she's not been compliant with his medications is quite possible that he may have had a deterioration in his cardiac function.  #2 heme positive stool with history of rectal bleeding: Once his respiratory status is improved he may benefit from inpatient colonoscopy prior to initiating anticoagulation. Please consult [facility] when the patient is improved. Hold off on anticoagulation if possible till then. Give PPI.  #3 demand ischemia with mildly elevated Troponin: Troponin is not significantly elevated. At this time he doesn't merit anticoagulation. Troponin levels will be trended. If it rises significantly we may have to consider heparin. This has been discussed with Dr. Jules Husbands, with cardiology and he agrees. He will however, be given aspirin. He does have some lower extremity edema. He's been given one dose of intravenous Lasix. Hold off on further doses of Lasix. Await echocardiogram.  Assess volume status in the morning and determine if he needs continued Lasix.  #4 acute bronchitis versus mild COPD exacerbation: Given steroids, nebulizer treatments, and antibiotics. Chest x-ray does not show any infiltrates.  #5 tobacco abuse: Counseling as been provided. Nicotine patch will be prescribed.  #6 Likely has chronic kidney disease: Creatinine is noted to be slightly elevated. This is stable compared to previous values. Continue to monitor renal function.   DVT Prophylaxis: SCDs Code Status: Full code Family Communication: Discussed with the patient  Disposition Plan: Transfer to [facility]. No step down beds available at [facility]. No cardiology available at [facility] till Tuesday.   Further management decisions will depend on results of further testing and patient's response to treatment.   Bonnielee Haff  [facility] Pager [phone]  If 7PM-7AM, please contact night-coverage [email or web address] Password [facility]  04/03/2014, 11:18 PM  Disclaimer: This note was dictated with voice recognition software. Similar sounding words can inadvertently be transcribed and may not be corrected upon review.

## 2014-04-03 NOTE — ED Notes (Signed)
[facility] - Don [name] given report to

## 2014-04-03 NOTE — ED Notes (Signed)
Went to assess pt. Pt not in room. Per tech pt at xray at the time.

## 2014-04-03 NOTE — ED Provider Notes (Signed)
CSN: [id number]     Arrival date & time 04/03/14  2003 History   First MD Initiated Contact with Patient 04/03/14 2105     Chief Complaint  Patient presents with  . Shortness of Breath      HPI Pt was seen at 2105. Per pt, c/o gradual onset and worsening of persistent SOB for the past 2 days. Has been associated with moist cough and chills. Endorses hx of COPD and continues to smoke cigarettes. Denies fevers, no CP/palpitations, no abd pain, no N/V/D, no back pain.     Past Medical History  Diagnosis Date  . Hyperlipidemia   . Hypertension   . COPD (chronic obstructive pulmonary disease)   . Prostate cancer   . Tubular adenoma of colon 07/31/02, 11/18/03  . Atrial fibrillation   . Noncompliance with medications 04/2013    xarelto, digoxin  . Tobacco abuse     ongoing   Past Surgical History  Procedure Laterality Date  . 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 Anal abcess,hemorroids,    . Esophagogastroduodenoscopy  11/18/2003    Dr. Earle Gell  . Colonoscopy w/ biopsies  11/18/2003    Dr. Earle Gell  . Tonsillectomy     Family History  Problem Relation Age of Onset  . Diabetes Father   . Heart disease Father     MI  . Heart attack Mother   . Heart disease Brother   . Heart attack Father   . Lung cancer Brother   . Lung cancer Sister    History  Substance Use Topics  . Smoking status: Current Every Day Smoker    Types: Cigarettes, Cigars  . Smokeless tobacco: Never Used  . Alcohol Use: No    Review of Systems ROS: Statement: All systems negative except as marked or noted in the HPI; Constitutional: Negative for objective fever and +chills. ; ; Eyes: Negative for eye pain, redness and discharge. ; ; ENMT: Negative for ear pain, hoarseness, nasal congestion, sinus pressure and sore throat. ; ; Cardiovascular: Negative for chest pain, palpitations, diaphoresis, and peripheral edema. ; ; Respiratory: +cough, SOB. Negative for wheezing and stridor. ; ;  Gastrointestinal: Negative for nausea, vomiting, diarrhea, abdominal pain, blood in stool, hematemesis, jaundice and rectal bleeding. . ; ; Genitourinary: Negative for dysuria, flank pain and hematuria. ; ; Musculoskeletal: Negative for back pain and neck pain. Negative for swelling and trauma.; ; Skin: Negative for pruritus, rash, abrasions, blisters, bruising and skin lesion.; ; Neuro: Negative for headache, lightheadedness and neck stiffness. Negative for weakness, altered level of consciousness , altered mental status, extremity weakness, paresthesias, involuntary movement, seizure and syncope.      Allergies  Wellbutrin  Home Medications   Prior to Admission medications   Medication Sig Start Date End Date Taking? Authorizing Provider  albuterol (PROVENTIL HFA;VENTOLIN HFA) 108 (90 BASE) MCG/ACT inhaler Inhale 2 puffs into the lungs every 6 (six) hours as needed for wheezing or shortness of breath. 03/16/14  Yes Lysbeth Penner, FNP  aspirin 81 MG tablet Take 81 mg by mouth daily.   Yes Historical Provider, MD  beta carotene w/minerals (OCUVITE) tablet Take 1 tablet by mouth daily.   Yes Historical Provider, MD  clonazePAM (KLONOPIN) 0.5 MG tablet Take 1 tablet (0.5 mg total) by mouth daily. 02/26/14  Yes Vernie Shanks, MD  diclofenac sodium (VOLTAREN) 1 % GEL Apply 2 g topically 4 (four) times daily. 10/16/13  Yes Vernie Shanks, MD  digoxin (LANOXIN) 0.25 MG  tablet Take 1 tablet (0.25 mg total) by mouth daily. 12/15/13  Yes Vernie Shanks, MD  DiphenhydrAMINE HCl, Sleep, (SLEEP-AID MAXIMUM STRENGTH) 50 MG CAPS Take 1 capsule by mouth at bedtime.   Yes Historical Provider, MD  fish oil-omega-3 fatty acids 1000 MG capsule Take 2 g by mouth 2 (two) times daily.    Yes Historical Provider, MD  Multiple Vitamin (MULTIVITAMIN WITH MINERALS) TABS Take 1 tablet by mouth daily.   Yes Historical Provider, MD  NITROSTAT 0.4 MG SL tablet DISSOLVE ONE TABLET UNDER THE TONGUE EVERY 5 MINUTES AS NEEDED FOR  CHEST PAIN.  DO NOT EXCEED A TOTAL OF 3 DOSES IN 15 MINUTES 04/29/13  Yes Vernie Shanks, MD  Polyethyl Glycol-Propyl Glycol (SYSTANE OP) Place 1 drop into both eyes 2 (two) times daily.   Yes Historical Provider, MD  Umeclidinium-Vilanterol (ANORO ELLIPTA) 62.5-25 MCG/INH AEPB Inhale 1 puff into the lungs daily.   Yes Historical Provider, MD  pravastatin (PRAVACHOL) 80 MG tablet Take 1 tablet (80 mg total) by mouth daily. 12/15/13   Vernie Shanks, MD  tamsulosin (FLOMAX) 0.4 MG CAPS Take 0.4 mg by mouth daily.    Historical Provider, MD  tiotropium (SPIRIVA) 18 MCG inhalation capsule Place 1 capsule (18 mcg total) into inhaler and inhale as needed. 12/15/13   Vernie Shanks, MD   BP 136/87  Pulse 122  Temp(Src) 99.2 F (37.3 C) (Oral)  Resp 29  Ht 5' 9.5" (1.765 m)  Wt 175 lb (79.379 kg)  BMI 25.48 kg/m2  SpO2 96% Physical Exam 2110: Physical examination:  Nursing notes reviewed; Vital signs and O2 SAT reviewed;  Constitutional: Well developed, Well nourished, Well hydrated, In no acute distress; Head:  Normocephalic, atraumatic; Eyes: EOMI, PERRL, No scleral icterus; ENMT: Mouth and pharynx normal, Mucous membranes moist; Neck: Supple, Full range of motion, No lymphadenopathy; Cardiovascular: Tachycardic rate and irregular irregular rhythm, No gallop; Respiratory: Breath sounds coarse & equal bilaterally, scattered faint wheezes. +moist cough during exam. Speaking full sentences, No retrax or access mm use. Normal respiratory effort/excursion; Chest: Nontender, Movement normal; Abdomen: Soft, Nontender, Nondistended, Normal bowel sounds. Rectal exam performed w/permission of pt and ED RN chaperone present.  Anal tone normal.  Non-tender, soft brown stool in rectal vault, heme positive.  No fissures, no external hemorrhoids, no palp masses.; Genitourinary: No CVA tenderness; Extremities: Pulses normal, No tenderness, No edema, No calf edema or asymmetry.; Neuro: AA&Ox3, vague historian. Major CN  grossly intact.  Speech clear. No gross focal motor or sensory deficits in extremities.; Skin: Color normal, Warm, Dry.   ED Course  Procedures     EKG Interpretation None      MDM  MDM Reviewed: previous chart, nursing note and vitals Reviewed previous: labs and ECG Interpretation: labs, ECG and x-ray Total time providing critical care: 30-74 minutes. This excludes time spent performing separately reportable procedures and services. Consults: admitting MD   CRITICAL CARE Performed by: Alfonzo Feller Total critical care time: 66 Critical care time was exclusive of separately billable procedures and treating other patients. Critical care was necessary to treat or prevent imminent or life-threatening deterioration. Critical care was time spent personally by me on the following activities: development of treatment plan with patient and/or surrogate as well as nursing, discussions with consultants, evaluation of patient's response to treatment, examination of patient, obtaining history from patient or surrogate, ordering and performing treatments and interventions, ordering and review of laboratory studies, ordering and review of radiographic studies, pulse  oximetry and re-evaluation of patient's condition.    Date: 04/03/2014  Rate: 166  Rhythm: atrial fibrillation with RVR  QRS Axis: normal  Intervals: normal  ST/T Wave abnormalities: nonspecific ST/T changes  Conduction Disutrbances:none  Narrative Interpretation:   Old EKG Reviewed: changes noted; previous EKG dated 05/15/2013 was NSR.   Results for orders placed during the hospital encounter of 04/03/14  CBC      Result Value Ref Range   WBC 13.3 (*) 4.0 - 10.5 K/uL   RBC 4.43  4.22 - 5.81 MIL/uL   Hemoglobin 14.2  13.0 - 17.0 g/dL   HCT 42.7  39.0 - 52.0 %   MCV 96.4  78.0 - 100.0 fL   MCH 32.1  26.0 - 34.0 pg   MCHC 33.3  30.0 - 36.0 g/dL   RDW 13.8  11.5 - 15.5 %   Platelets 258  150 - 400 K/uL  PRO B  NATRIURETIC PEPTIDE      Result Value Ref Range   Pro B Natriuretic peptide (BNP) 3436.0 (*) 0 - 125 pg/mL  TROPONIN I      Result Value Ref Range   Troponin I 0.40 (*) <0.30 ng/mL  BASIC METABOLIC PANEL      Result Value Ref Range   Sodium 138  137 - 147 mEq/L   Potassium 5.1  3.7 - 5.3 mEq/L   Chloride 101  96 - 112 mEq/L   CO2 25  19 - 32 mEq/L   Glucose, Bld 95  70 - 99 mg/dL   BUN 24 (*) 6 - 23 mg/dL   Creatinine, Ser 1.47 (*) 0.50 - 1.35 mg/dL   Calcium 9.2  8.4 - 10.5 mg/dL   GFR calc non Af Amer 45 (*) >90 mL/min   GFR calc Af Amer 52 (*) >90 mL/min  DIGOXIN LEVEL      Result Value Ref Range   Digoxin Level 0.4 (*) 0.8 - 2.0 ng/mL   Dg Chest 2 View 04/03/2014   CLINICAL DATA:  Short of breath.  Cough.  EXAM: CHEST  2 VIEW  COMPARISON:  03/16/2014  FINDINGS: Cardiac silhouette is borderline enlarged. Normal mediastinal and hilar contours. Lungs are mildly hyperexpanded. There is mild scarring at the apices. No lung consolidation or edema. No pleural effusion or pneumothorax. Bony thorax is demineralized but intact.  IMPRESSION: No acute cardiopulmonary disease.   Electronically Signed   By: Lajean Manes M.D.   On: 04/03/2014 21:07    Results for YOSHIHARU, BRASSELL (MRN [id number]) as of 04/03/2014 22:42  Ref. Range 03/20/2014 14:45 04/03/2014 21:05  BUN Latest Range: 8-27 mg/dL 28 (H) 24 (H)  Creatinine Latest Range: 0.50-1.35 mg/dL 1.60 (H) 1.47 (H)    2115:  Pt with moist cough, faint scattered wheezes; will dose short neb while CXR pending. Monitor afib with RVR; will dose IV Cardizem bolus and gtt. After pt informed his HR was elevated he endorsed hx of afib. States he stopped taking his xarelto "quite a while ago" because his "stools turned black and I got really tired." States he did not f/u with is PMD or GI MD regarding this. Pt also states he "sometimes" takes his digoxin. Pt does state currently his stools "are brown" but "sometimes turn real dark again."   2200:  HR  trending downward from 170's to 120's. Will give 2nd IV cardizem bolus and increase gtt rate.  BNP elevated, no old to compare; will dose IV lasix.   2230:  Troponin mildly  elevated; likely due to demand. Pt continues to deny CP. Will hold IV heparin at this time given heme positive stool today and hx GI bleeding while on xarelto. Digoxin level subtherapeutic per hx non-compliance. Dx and testing d/w pt.  Questions answered.  Verb understanding, agreeable to d/c home with outpt f/u.  T/C to [facility] Pink, case discussed, including:  HPI, pertinent PM/SHx, VS/PE, dx testing, ED course and treatment:  Agreeable to admit, requests he will come to the ED for evaluation to transfer to [facility].      Alfonzo Feller, DO 04/05/14 1320

## 2014-04-03 NOTE — Progress Notes (Signed)
ANTIBIOTIC CONSULT NOTE-Preliminary  Pharmacy Consult for Levofloxacin Indication: Acute bronchitis/COPD exacerbation  Allergies  Allergen Reactions  . Wellbutrin [Bupropion] Anxiety    Patient Measurements: Height: 5' 9.5" (176.5 cm) Weight: 175 lb (79.379 kg) IBW/kg (Calculated) : 71.85   Vital Signs: Temp: 98.7 F (37.1 C) (05/22 2319) Temp src: Oral (05/22 2319) BP: 145/77 mmHg (05/22 2319) Pulse Rate: 113 (05/22 2319)  Labs:  Recent Labs  04/03/14 2105  WBC 13.3*  HGB 14.2  PLT 258  CREATININE 1.47*    Estimated Creatinine Clearance: 44.8 ml/min (by C-G formula based on Cr of 1.47).  No results found for this basename: VANCOTROUGH, VANCOPEAK, VANCORANDOM, GENTTROUGH, GENTPEAK, GENTRANDOM, TOBRATROUGH, TOBRAPEAK, TOBRARND, AMIKACINPEAK, AMIKACINTROU, AMIKACIN,  in the last 72 hours   Microbiology: No results found for this or any previous visit (from the past 720 hour(s)).  Medical History: Past Medical History  Diagnosis Date  . Hyperlipidemia   . Hypertension   . COPD (chronic obstructive pulmonary disease)   . Prostate cancer   . Tubular adenoma of colon 07/31/02, 11/18/03  . Atrial fibrillation   . Noncompliance with medications 04/2013    xarelto, digoxin  . Tobacco abuse     ongoing    Medications:    Assessment: 73 yo male seen in the ED with reported increasing SOB, wheezing, cough x 1-2 days. PMH sig for COPD.  WBCs are elevated to 13.3. X-ray shows no infiltrate. Pt is to be started on IV levofloxacin for acute bronchitis.  Goal of Therapy:  Eradication of infection  Plan:  Preliminary review of pertinent patient information completed.  Protocol will be initiated with a one-time dose of levofloxacin 750 mg IV.  Forestine Na clinical pharmacist will complete review during morning rounds to assess patient and finalize treatment regimen.  Norberto Sorenson, [facility] 04/03/2014,11:44 PM .

## 2014-04-03 NOTE — ED Notes (Signed)
[facility] here to transport pt 

## 2014-04-03 NOTE — ED Notes (Signed)
Dr. Maryland Pink here to examine pt.

## 2014-04-03 NOTE — ED Notes (Addendum)
Pt c/o sob, cough, chills, and fever that started last night before bed time. Pt states when he takes a deep breath his chest hurts and he coughs a lot.

## 2014-04-03 NOTE — ED Notes (Signed)
Spoke with Marcie Bal in ICU at [facility] to given report. Nurse will call back when available. Informed Drenda Freeze en route to [facility] to transfer.

## 2014-04-04 ENCOUNTER — Encounter (HOSPITAL_COMMUNITY): Payer: Self-pay | Admitting: Internal Medicine

## 2014-04-04 DIAGNOSIS — J4489 Other specified chronic obstructive pulmonary disease: Secondary | ICD-10-CM

## 2014-04-04 DIAGNOSIS — K625 Hemorrhage of anus and rectum: Secondary | ICD-10-CM

## 2014-04-04 DIAGNOSIS — I4891 Unspecified atrial fibrillation: Principal | ICD-10-CM

## 2014-04-04 DIAGNOSIS — E785 Hyperlipidemia, unspecified: Secondary | ICD-10-CM | POA: Diagnosis present

## 2014-04-04 DIAGNOSIS — K922 Gastrointestinal hemorrhage, unspecified: Secondary | ICD-10-CM

## 2014-04-04 DIAGNOSIS — Z860100 Personal history of colon polyps, unspecified: Secondary | ICD-10-CM

## 2014-04-04 DIAGNOSIS — R0602 Shortness of breath: Secondary | ICD-10-CM

## 2014-04-04 DIAGNOSIS — R778 Other specified abnormalities of plasma proteins: Secondary | ICD-10-CM | POA: Diagnosis present

## 2014-04-04 DIAGNOSIS — R7989 Other specified abnormal findings of blood chemistry: Secondary | ICD-10-CM

## 2014-04-04 DIAGNOSIS — J449 Chronic obstructive pulmonary disease, unspecified: Secondary | ICD-10-CM

## 2014-04-04 DIAGNOSIS — I509 Heart failure, unspecified: Secondary | ICD-10-CM

## 2014-04-04 DIAGNOSIS — R195 Other fecal abnormalities: Secondary | ICD-10-CM

## 2014-04-04 DIAGNOSIS — I059 Rheumatic mitral valve disease, unspecified: Secondary | ICD-10-CM

## 2014-04-04 LAB — COMPREHENSIVE METABOLIC PANEL
ALK PHOS: 97 U/L (ref 39–117)
ALT: 24 U/L (ref 0–53)
AST: 26 U/L (ref 0–37)
Albumin: 3.4 g/dL — ABNORMAL LOW (ref 3.5–5.2)
BUN: 23 mg/dL (ref 6–23)
CALCIUM: 8.9 mg/dL (ref 8.4–10.5)
CO2: 25 meq/L (ref 19–32)
Chloride: 98 mEq/L (ref 96–112)
Creatinine, Ser: 1.44 mg/dL — ABNORMAL HIGH (ref 0.50–1.35)
GFR, EST AFRICAN AMERICAN: 54 mL/min — AB (ref >90)
GFR, EST NON AFRICAN AMERICAN: 46 mL/min — AB (ref >90)
GLUCOSE: 111 mg/dL — AB (ref 70–99)
Potassium: 4.6 mEq/L (ref 3.7–5.3)
Sodium: 137 mEq/L (ref 137–147)
Total Bilirubin: 1.1 mg/dL (ref 0.3–1.2)
Total Protein: 6.9 g/dL (ref 6.0–8.3)

## 2014-04-04 LAB — PROTIME-INR
INR: 1.15 (ref 0.00–1.49)
Prothrombin Time: 14.5 seconds (ref 11.6–15.2)

## 2014-04-04 LAB — TROPONIN I
TROPONIN I: 0.42 ng/mL — AB (ref <0.30)
Troponin I: <0.3 ng/mL (ref <0.30)

## 2014-04-04 LAB — CBC
HCT: 41.3 % (ref 39.0–52.0)
HEMOGLOBIN: 13.9 g/dL (ref 13.0–17.0)
MCH: 32.6 pg (ref 26.0–34.0)
MCHC: 33.7 g/dL (ref 30.0–36.0)
MCV: 96.7 fL (ref 78.0–100.0)
Platelets: 224 10*3/uL (ref 150–400)
RBC: 4.27 MIL/uL (ref 4.22–5.81)
RDW: 13.8 % (ref 11.5–15.5)
WBC: 13.1 10*3/uL — ABNORMAL HIGH (ref 4.0–10.5)

## 2014-04-04 LAB — MRSA PCR SCREENING: MRSA by PCR: NEGATIVE

## 2014-04-04 LAB — TSH: TSH: 1.93 u[IU]/mL (ref 0.350–4.500)

## 2014-04-04 LAB — APTT: aPTT: 38 seconds — ABNORMAL HIGH (ref 24–37)

## 2014-04-04 LAB — GLUCOSE, CAPILLARY: GLUCOSE-CAPILLARY: 195 mg/dL — AB (ref 70–99)

## 2014-04-04 MED ORDER — PEG-KCL-NACL-NASULF-NA ASC-C 100 G PO SOLR
0.5000 | Freq: Once | ORAL | Status: AC
  Administered 2014-04-05: 100 g via ORAL

## 2014-04-04 MED ORDER — IPRATROPIUM BROMIDE 0.02 % IN SOLN
0.5000 mg | Freq: Four times a day (QID) | RESPIRATORY_TRACT | Status: DC
  Administered 2014-04-04 (×4): 0.5 mg via RESPIRATORY_TRACT
  Filled 2014-04-04 (×4): qty 2.5

## 2014-04-04 MED ORDER — CLONAZEPAM 0.5 MG PO TABS
0.5000 mg | ORAL_TABLET | Freq: Every day | ORAL | Status: DC
  Administered 2014-04-04 – 2014-04-08 (×6): 0.5 mg via ORAL
  Filled 2014-04-04 (×6): qty 1

## 2014-04-04 MED ORDER — ASPIRIN EC 81 MG PO TBEC
81.0000 mg | DELAYED_RELEASE_TABLET | Freq: Every day | ORAL | Status: DC
  Administered 2014-04-04 – 2014-04-07 (×4): 81 mg via ORAL
  Filled 2014-04-04 (×5): qty 1

## 2014-04-04 MED ORDER — ATORVASTATIN CALCIUM 20 MG PO TABS
20.0000 mg | ORAL_TABLET | Freq: Every day | ORAL | Status: DC
  Administered 2014-04-04 – 2014-04-08 (×5): 20 mg via ORAL
  Filled 2014-04-04 (×6): qty 1

## 2014-04-04 MED ORDER — CLONAZEPAM 0.5 MG PO TABS
0.5000 mg | ORAL_TABLET | Freq: Every day | ORAL | Status: DC
  Administered 2014-04-04 – 2014-04-09 (×6): 0.5 mg via ORAL
  Filled 2014-04-04 (×6): qty 1

## 2014-04-04 MED ORDER — PEG-KCL-NACL-NASULF-NA ASC-C 100 G PO SOLR
1.0000 | Freq: Once | ORAL | Status: DC
Start: 2014-04-04 — End: 2014-04-04

## 2014-04-04 MED ORDER — ONDANSETRON HCL 4 MG PO TABS: 4.0000 mg | ORAL_TABLET | Freq: Four times a day (QID) | ORAL | Status: DC | PRN

## 2014-04-04 MED ORDER — LEVALBUTEROL HCL 0.63 MG/3ML IN NEBU: 0.6300 mg | INHALATION_SOLUTION | Freq: Four times a day (QID) | RESPIRATORY_TRACT | Status: DC | PRN

## 2014-04-04 MED ORDER — POLYETHYL GLYCOL-PROPYL GLYCOL 0.4-0.3 % OP SOLN: 1.0000 [drp] | Freq: Two times a day (BID) | OPHTHALMIC | Status: DC

## 2014-04-04 MED ORDER — MORPHINE SULFATE 2 MG/ML IJ SOLN
1.0000 mg | INTRAMUSCULAR | Status: DC | PRN
  Administered 2014-04-04: 1 mg via INTRAVENOUS
  Filled 2014-04-04: qty 1

## 2014-04-04 MED ORDER — METHYLPREDNISOLONE SODIUM SUCC 125 MG IJ SOLR
60.0000 mg | Freq: Two times a day (BID) | INTRAMUSCULAR | Status: DC
Start: 2014-04-04 — End: 2014-04-06
  Administered 2014-04-04 – 2014-04-06 (×4): 60 mg via INTRAVENOUS
  Filled 2014-04-04 (×5): qty 0.96

## 2014-04-04 MED ORDER — DILTIAZEM HCL 60 MG PO TABS
60.0000 mg | ORAL_TABLET | Freq: Four times a day (QID) | ORAL | Status: DC
  Administered 2014-04-04 – 2014-04-06 (×9): 60 mg via ORAL
  Filled 2014-04-04 (×13): qty 1

## 2014-04-04 MED ORDER — SODIUM CHLORIDE 0.9 % IJ SOLN
3.0000 mL | Freq: Two times a day (BID) | INTRAMUSCULAR | Status: DC
  Administered 2014-04-04 – 2014-04-09 (×12): 3 mL via INTRAVENOUS

## 2014-04-04 MED ORDER — IPRATROPIUM BROMIDE 0.02 % IN SOLN
0.5000 mg | Freq: Three times a day (TID) | RESPIRATORY_TRACT | Status: DC
  Administered 2014-04-05 – 2014-04-06 (×6): 0.5 mg via RESPIRATORY_TRACT
  Filled 2014-04-04 (×6): qty 2.5

## 2014-04-04 MED ORDER — PANTOPRAZOLE SODIUM 40 MG PO TBEC
40.0000 mg | DELAYED_RELEASE_TABLET | Freq: Two times a day (BID) | ORAL | Status: DC
  Administered 2014-04-04 – 2014-04-09 (×12): 40 mg via ORAL
  Filled 2014-04-04 (×13): qty 1

## 2014-04-04 MED ORDER — ONDANSETRON HCL 4 MG/2ML IJ SOLN: 4.0000 mg | Freq: Four times a day (QID) | INTRAMUSCULAR | Status: DC | PRN

## 2014-04-04 MED ORDER — SODIUM CHLORIDE 0.9 % IJ SOLN: 3.0000 mL | INTRAMUSCULAR | Status: DC | PRN

## 2014-04-04 MED ORDER — PEG-KCL-NACL-NASULF-NA ASC-C 100 G PO SOLR
0.5000 | Freq: Once | ORAL | Status: AC
  Administered 2014-04-04: 100 g via ORAL
  Filled 2014-04-04: qty 1

## 2014-04-04 MED ORDER — ACETAMINOPHEN 325 MG PO TABS
650.0000 mg | ORAL_TABLET | Freq: Four times a day (QID) | ORAL | Status: DC | PRN
  Administered 2014-04-05: 650 mg via ORAL
  Filled 2014-04-04: qty 2

## 2014-04-04 MED ORDER — LEVALBUTEROL HCL 0.63 MG/3ML IN NEBU
0.6300 mg | INHALATION_SOLUTION | Freq: Three times a day (TID) | RESPIRATORY_TRACT | Status: DC
  Administered 2014-04-05 – 2014-04-09 (×11): 0.63 mg via RESPIRATORY_TRACT
  Filled 2014-04-04 (×27): qty 3

## 2014-04-04 MED ORDER — SIMVASTATIN 40 MG PO TABS: 40.0000 mg | ORAL_TABLET | Freq: Every day | ORAL | Status: DC

## 2014-04-04 MED ORDER — NICOTINE 14 MG/24HR TD PT24
14.0000 mg | MEDICATED_PATCH | Freq: Every day | TRANSDERMAL | Status: DC
  Administered 2014-04-04 – 2014-04-09 (×6): 14 mg via TRANSDERMAL
  Filled 2014-04-04 (×6): qty 1

## 2014-04-04 MED ORDER — OXYCODONE HCL 5 MG PO TABS
5.0000 mg | ORAL_TABLET | ORAL | Status: DC | PRN
  Administered 2014-04-04: 5 mg via ORAL
  Filled 2014-04-04: qty 1

## 2014-04-04 MED ORDER — ACETAMINOPHEN 650 MG RE SUPP: 650.0000 mg | Freq: Four times a day (QID) | RECTAL | Status: DC | PRN

## 2014-04-04 MED ORDER — KETOTIFEN FUMARATE 0.025 % OP SOLN
2.0000 [drp] | Freq: Two times a day (BID) | OPHTHALMIC | Status: DC
  Administered 2014-04-04 – 2014-04-09 (×11): 2 [drp] via OPHTHALMIC
  Filled 2014-04-04 (×3): qty 5

## 2014-04-04 MED ORDER — POLYVINYL ALCOHOL 1.4 % OP SOLN
1.0000 [drp] | Freq: Two times a day (BID) | OPHTHALMIC | Status: DC
  Administered 2014-04-04 – 2014-04-09 (×11): 1 [drp] via OPHTHALMIC
  Filled 2014-04-04 (×3): qty 15

## 2014-04-04 MED ORDER — SODIUM CHLORIDE 0.9 % IJ SOLN
3.0000 mL | Freq: Two times a day (BID) | INTRAMUSCULAR | Status: DC
  Administered 2014-04-04 – 2014-04-07 (×6): 3 mL via INTRAVENOUS

## 2014-04-04 MED ORDER — SODIUM CHLORIDE 0.9 % IV SOLN
250.0000 mL | INTRAVENOUS | Status: DC | PRN
  Administered 2014-04-05: 500 mL via INTRAVENOUS

## 2014-04-04 MED ORDER — TAMSULOSIN HCL 0.4 MG PO CAPS
0.4000 mg | ORAL_CAPSULE | Freq: Every day | ORAL | Status: DC
  Administered 2014-04-05 – 2014-04-09 (×5): 0.4 mg via ORAL
  Filled 2014-04-04 (×6): qty 1

## 2014-04-04 MED ORDER — LEVALBUTEROL HCL 0.63 MG/3ML IN NEBU
0.6300 mg | INHALATION_SOLUTION | Freq: Four times a day (QID) | RESPIRATORY_TRACT | Status: DC
  Administered 2014-04-04 (×4): 0.63 mg via RESPIRATORY_TRACT
  Filled 2014-04-04 (×8): qty 3

## 2014-04-04 NOTE — Consult Note (Signed)
Referring Provider: [facility] Primary Care Physician:  Anthoney Harada, MD Primary Gastroenterologist:  Dr.Gessner  Reason for Consultation:  Hx of rectal bleeding, heme + stool, needs anticoagulation  HPI: Don [name] is a 73 y.o. male  who  was admitted in transfer here from [facility] last night after he had presented there with worsening cough. He was found to be in rapid atrial fib with mildly elevated troponins and an elevated BNP of 3400+. He has been evaluated by cardiology and rate is currently controlled with Cardizem. It is felt he will need chronic anticoagulation and we are asked to see him for colonoscopy given history of rectal bleeding. Patient is known to Dr. Carlean Purl and had seen him in February of 2015 for rectal bleeding of dark red blood when he was on Xarelto for a  short period of time. He was to have colonoscopy but was waiting for a change in his insurance and colonoscopy has not been done. Patient says he has not seen any blood over the past couple of months. He has no complaints of abdominal pain or rectal pain. He does have occasional loose stools and occasional episodes of fecal soilage. He gives history of remote perianal abscess for which he had surgery. He also has history of prostate cancer and is status post prostatectomy followed by radiation. Other medical problems include hyperlipidemia hypertension COPD and stage III chronic kidney disease. He has remote history of colon polyps with last colonoscopy done in 2005 by Dr. Howell Rucks. Patient has had her recent increase in coughing and was diagnosed with bronchitis as an outpatient, placed on antibiotics which he did not think were helping. He actually had a choking episode at lunchtime today and had to have the Heimlich maneuver performed. He denies any recent choking episodes at home or dysphagia. CT scan of the chest done this month shows COPD and bronchiectasis, chest x-ray done on admission no  pulmonary edema 2-D echo done earlier today as an EF of 45% Hemoglobin is in the 13 range.   Past Medical History  Diagnosis Date  . Hyperlipidemia   . Hypertension   . COPD (chronic obstructive pulmonary disease)   . Prostate cancer   . Tubular adenoma of colon 07/31/02, 11/18/03  . Atrial fibrillation   . Noncompliance with medications 04/2013    xarelto, digoxin  . Tobacco abuse     ongoing  . Stage III chronic kidney disease 04/03/2014    Past Surgical History  Procedure Laterality Date  . 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 Anal abcess,hemorroids,    . Esophagogastroduodenoscopy  11/18/2003    Dr. Earle Gell  . Colonoscopy w/ biopsies  11/18/2003    Dr. Earle Gell  . Tonsillectomy      Prior to Admission medications   Medication Sig Start Date End Date Taking? Authorizing Provider  albuterol (PROVENTIL HFA;VENTOLIN HFA) 108 (90 BASE) MCG/ACT inhaler Inhale 2 puffs into the lungs every 6 (six) hours as needed for wheezing or shortness of breath. 03/16/14  Yes Lysbeth Penner, FNP  aspirin 81 MG tablet Take 81 mg by mouth daily.   Yes Historical Provider, MD  beta carotene w/minerals (OCUVITE) tablet Take 1 tablet by mouth daily.   Yes Historical Provider, MD  clonazePAM (KLONOPIN) 0.5 MG tablet Take 1 tablet (0.5 mg total) by mouth daily. 02/26/14  Yes Vernie Shanks, MD  diclofenac sodium (VOLTAREN) 1 % GEL Apply 2 g topically 4 (four) times daily. 10/16/13  Yes Vernie Shanks, MD  digoxin (LANOXIN) 0.25 MG tablet Take 1 tablet (0.25 mg total) by mouth daily. 12/15/13  Yes Vernie Shanks, MD  DiphenhydrAMINE HCl, Sleep, (SLEEP-AID MAXIMUM STRENGTH) 50 MG CAPS Take 1 capsule by mouth at bedtime.   Yes Historical Provider, MD  fish oil-omega-3 fatty acids 1000 MG capsule Take 2 g by mouth 2 (two) times daily.    Yes Historical Provider, MD  Multiple Vitamin (MULTIVITAMIN WITH MINERALS) TABS Take 1 tablet by mouth daily.   Yes Historical Provider, MD  nitroGLYCERIN  (NITROSTAT) 0.4 MG SL tablet Place 0.4 mg under the tongue every 5 (five) minutes as needed for chest pain.   Yes Historical Provider, MD  Polyethyl Glycol-Propyl Glycol (SYSTANE OP) Place 1 drop into both eyes 2 (two) times daily as needed (dry eyes).    Yes Historical Provider, MD  pravastatin (PRAVACHOL) 80 MG tablet Take 1 tablet (80 mg total) by mouth daily. 12/15/13  Yes Vernie Shanks, MD  tiotropium (SPIRIVA) 18 MCG inhalation capsule Place 18 mcg into inhaler and inhale daily as needed (shortness of breath).   Yes Historical Provider, MD  Umeclidinium-Vilanterol (ANORO ELLIPTA) 62.5-25 MCG/INH AEPB Inhale 1 puff into the lungs daily.   Yes Historical Provider, MD    Current Facility-Administered Medications  Medication Dose Route Frequency Provider Last Rate Last Dose  . 0.9 %  sodium chloride infusion  250 mL Intravenous PRN Bonnielee Haff, MD      . acetaminophen (TYLENOL) tablet 650 mg  650 mg Oral Q6H PRN Bonnielee Haff, MD       Or  . acetaminophen (TYLENOL) suppository 650 mg  650 mg Rectal Q6H PRN Bonnielee Haff, MD      . aspirin EC tablet 81 mg  81 mg Oral Daily Bonnielee Haff, MD   81 mg at 04/04/14 0148  . atorvastatin (LIPITOR) tablet 20 mg  20 mg Oral q1800 Bonnielee Haff, MD      . clonazePAM Bobbye Charleston) tablet 0.5 mg  0.5 mg Oral Daily Bonnielee Haff, MD   0.5 mg at 04/04/14 1052  . clonazePAM (KLONOPIN) tablet 0.5 mg  0.5 mg Oral QHS Ritta Slot, NP   0.5 mg at 04/04/14 0148  . diltiazem (CARDIZEM) 100 mg in dextrose 5 % 100 mL infusion  5-15 mg/hr Intravenous Continuous Alfonzo Feller, DO   5 mg/hr at 04/04/14 0400  . diltiazem (CARDIZEM) tablet 60 mg  60 mg Oral 4 times per day Jules Husbands, MD   60 mg at 04/04/14 1246  . ipratropium (ATROVENT) nebulizer solution 0.5 mg  0.5 mg Nebulization Q6H Bonnielee Haff, MD   0.5 mg at 04/04/14 1008  . ketotifen (ZADITOR) 0.025 % ophthalmic solution 2 drop  2 drop Both Eyes BID Venetia Maxon Rama, MD   2 drop at 04/04/14 1100  .  levalbuterol (XOPENEX) nebulizer solution 0.63 mg  0.63 mg Nebulization Q6H Bonnielee Haff, MD   0.63 mg at 04/04/14 1008  . levalbuterol (XOPENEX) nebulizer solution 0.63 mg  0.63 mg Nebulization Q6H PRN Bonnielee Haff, MD      . methylPREDNISolone sodium succinate (SOLU-MEDROL) 125 mg/2 mL injection 60 mg  60 mg Intravenous Q6H Bonnielee Haff, MD   60 mg at 04/04/14 1057  . morphine 2 MG/ML injection 1 mg  1 mg Intravenous Q4H PRN Bonnielee Haff, MD   1 mg at 04/04/14 0449  . nicotine (NICODERM CQ - dosed in mg/24 hours) patch 14 mg  14 mg Transdermal Daily  Bonnielee Haff, MD   14 mg at 04/04/14 0148  . ondansetron (ZOFRAN) tablet 4 mg  4 mg Oral Q6H PRN Bonnielee Haff, MD       Or  . ondansetron (ZOFRAN) injection 4 mg  4 mg Intravenous Q6H PRN Bonnielee Haff, MD      . oxyCODONE (Oxy IR/ROXICODONE) immediate release tablet 5 mg  5 mg Oral Q4H PRN Bonnielee Haff, MD      . pantoprazole (PROTONIX) EC tablet 40 mg  40 mg Oral BID Bonnielee Haff, MD   40 mg at 04/04/14 1052  . polyvinyl alcohol (LIQUIFILM TEARS) 1.4 % ophthalmic solution 1 drop  1 drop Both Eyes BID Venetia Maxon Rama, MD   1 drop at 04/04/14 1100  . sodium chloride 0.9 % injection 3 mL  3 mL Intravenous Q12H Bonnielee Haff, MD   3 mL at 04/04/14 0149  . sodium chloride 0.9 % injection 3 mL  3 mL Intravenous Q12H Bonnielee Haff, MD   3 mL at 04/04/14 1102  . sodium chloride 0.9 % injection 3 mL  3 mL Intravenous PRN Bonnielee Haff, MD      . tamsulosin (FLOMAX) capsule 0.4 mg  0.4 mg Oral Daily Bonnielee Haff, MD        Allergies as of 04/03/2014 - Review Complete 04/03/2014  Allergen Reaction Noted  . Wellbutrin [bupropion] Anxiety 03/21/2013    Family History  Problem Relation Age of Onset  . Diabetes Father   . Heart disease Father     MI  . Heart attack Mother   . Heart disease Brother   . Heart attack Father   . Lung cancer Brother   . Lung cancer Sister     History   Social History  . Marital Status: Married     Spouse Name: N/A    Number of Children: 0  . Years of Education: N/A   Occupational History  . retired    Social History Main Topics  . Smoking status: Current Every Day Smoker    Types: Cigarettes, Cigars  . Smokeless tobacco: Never Used  . Alcohol Use: No  . Drug Use: No  . Sexual Activity: Not on file   Other Topics Concern  . 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 Married to his second wife, she is in a nursing home.    Review of Systems: ROS negative except as in HPI   Physical Exam: Vital signs in last 24 hours: Temp:  [97.6 F (36.4 C)-99.2 F (37.3 C)] 97.6 F (36.4 C) (05/23 1200) Pulse Rate:  [49-163] 84 (05/23 1200) Resp:  [19-36] 22 (05/23 1200) BP: (113-153)/(62-109) 129/74 mmHg (05/23 1200) SpO2:  [89 %-99 %] 96 % (05/23 1200) Weight:  [163 lb 9.3 oz (74.2 kg)-175 lb (79.379 kg)] 164 lb 10.9 oz (74.7 kg) (05/23 0500) Last BM Date: 04/04/14 General:   Alert, well-developed,elderly WM pleasant and cooperative in NAD, coughing Head:  Normocephalic and atraumatic. Eyes:  Sclera clear, no icterus.   Conjunctiva pink. Ears:  Normal auditory acuity. Nose:  No deformity, discharge,  or lesions. Mouth:  No deformity or lesions.   Neck:  Supple; no masses or thyromegaly. Lungs: wheezes bilaterally left>right.  Heart: irregular rate and rhythm; no murmurs, clicks, rubs,  or gallops. Abdomen:  Soft,nontender, BS active,nonpalp mass or hsm.   Rectal:  Deferred  Msk:  Symmetrical without gross deformities. . Pulses:  Normal pulses noted. Extremities:  Without clubbing or edema. Neurologic:  Alert and  oriented x4;  grossly normal neurologically. Skin:  Intact without significant lesions or rashes.. Psych:  Alert and cooperative. Normal mood and affect.  Intake/Output from previous day: 05/22 0701 - 05/23 0700 In: 85 [I.V.:85] Out: 700 [Urine:700] Intake/Output this shift: Total I/O In:  3 [I.V.:3] Out: -   Lab Results: BNP 3400+,   Recent Labs  04/03/14 2105 04/04/14 0130  WBC 13.3* 13.1*  HGB 14.2 13.9  HCT 42.7 41.3  PLT 258 224   BMET  Recent Labs  04/03/14 2105 04/04/14 0130  NA 138 137  K 5.1 4.6  CL 101 98  CO2 25 25  GLUCOSE 95 111*  BUN 24* 23  CREATININE 1.47* 1.44*  CALCIUM 9.2 8.9   LFT  Recent Labs  04/04/14 0130  PROT 6.9  ALBUMIN 3.4*  AST 26  ALT 24  ALKPHOS 97  BILITOT 1.1   PT/INR  Recent Labs  04/04/14 0130  LABPROT 14.5  INR 1.15     IMPRESSION:  #68  73 year old white male admitted with atrial fibrillation with RVR, mild elevation in troponins and elevated BNP. Initially felt due to demand ischemia and now per cardiology secondary to rapid atrial fib Patient has had no complaints of chest pain or shortness of breath, rate currently controlled with by mouth Cardizem. Patient needs chronic anticoagulation. #2 history of rectal bleeding earlier this year, heme + stool on admission. Colonoscopy had been scheduled but was not completed. Patient denies any recent rectal bleeding, he is not anemic. Rule out occult colon lesion rule out bleeding secondary to internal hemorrhoids or possibly radiation proctitis intermittently. #3 history of adenomatous colon polyps last colonoscopy 2005 #4 COPD, and probable bronchiectasis #5 recent cough-question secondary to bronchitis #6 stage III chronic kidney disease #7 history of prostate cancer status post prostatectomy and radiation   PLAN: Will schedule for colonoscopy tomorrow with Dr. Fuller Plan procedure discussed in detail with the patient and he is agreeable to proceed. Start clear liquids and then n.p.o. after midnight.   Amy S Rush  04/04/2014, 2:34 PM     Attending physician's note   I have taken a history, examined the patient and reviewed the chart. I agree with the Advanced Practitioner's note, impression and recommendations. Heme + stool on admission, history of  small volume rectal bleeding earlier this year and personal history of adenomatous colon polyps in 2005. Anticoagulation is anticipated for mgmt of afib. Colonoscopy tomorrow. Bowel prep today. The risks, benefits, and alternatives to colonoscopy with possible biopsy and possible polypectomy were discussed with the patient and they consent to proceed.    Ladene Artist, MD [facility]

## 2014-04-04 NOTE — Progress Notes (Signed)
Progress Note   Don [name] WKG:[id number] DOB: 09/18/1941 DOA: 04/03/2014 PCP: Anthoney Harada, MD   Brief Narrative:   Don [name] is an 73 y.o. male with PMH of atrial fibrillation on digoxin/diltiazem/anticoagulated with xarelto, COPD with ongoing tobacco use and hypertension who presented to [facility] 04/03/14 with cough, wheezing, chest discomfort and nausea. The patient had stopped his Xarelto 1-2 months ago secondary to dark stools. Upon initial evaluation in the ER, the patient was found to be in atrial fibrillation with rapid ventricular response, likely triggered by a COPD exacerbation. His initial troponins were mildly elevated. He subsequently was transferred to [facility] for cardiology and GI consultation.   Assessment/Plan:   Principal Problem:   Atrial fibrillation with RVR / elevated troponin / demand ischemia  Patient was placed on a Cardizem drip and his digoxin was held.  Rate now controlled on Cardizem PO.  Cardiology consultation performed by Dr. Claiborne Billings 04/04/14 who recommended that we reconsider anticoagulation after GI evaluation.  TSH WNL at 1.930. Pro BNP elevated at 3436.  Followup 2-D echo.  Troponin elevation likely from demand ischemia. Active Problems:   Hyperlipidemia  Zocor changed to an equivalent dose of Lipitor due to the potential drug interaction with Cardizem. Will need to be discharged on Lipitor if he goes home on Cardizem.   COPD (chronic obstructive pulmonary disease) with acute bronchitis  Chest x-ray negative for infiltrates, so empiric Levaquin discontinued.  Continue Solu-Medrol, wean as tolerated.  Continue Xopenex.   Hypertension  Blood pressure currently controlled on Cardizem.   Tobacco abuse  Tobacco cessation counseling provided.  Continue nicotine patch.   Heme positive stool  No anticoagulation until GI evaluation performed. [place] [facility] consultation requested.  Continue PPI therapy.   History of prostate  cancer  No active treatment.   Stage III CKD (chronic kidney disease)  Patient's GFR ranges from 52-60, consistent with stage III chronic kidney disease.  Baseline creatinine 1.4-1.6. Current creatinine consistent with usual baseline values.   DVT Prophylaxis  Code Status: Full. Family Communication: No family at the bedside. Disposition Plan: Home when stable.   IV Access:    Peripheral IV   Procedures:    2 D Echo 04/04/14   Medical Consultants:    Dr. Jules Husbands, Cardiology.   Other Consultants:    None.   Anti-Infectives:    Levaquin 04/03/14---> 04/03/14  Subjective:    Don [name] is complaining of a mostly dry cough, no sputum.  Says he is breathing easier.  No chest pain, slightly tight when coughing.  No nausea or vomiting today.  Objective:    Filed Vitals:   04/04/14 0500 04/04/14 0530 04/04/14 0600 04/04/14 0630  BP: 126/75 128/65 122/76 113/62  Pulse: 84 93 83 98  Temp:      TempSrc:      Resp: 23 23 23 24   Height:      Weight: 74.7 kg (164 lb 10.9 oz)     SpO2: 94% 95% 95% 96%    Intake/Output Summary (Last 24 hours) at 04/04/14 0705 Last data filed at 04/04/14 0600  Gross per 24 hour  Intake     85 ml  Output    700 ml  Net   -615 ml    Exam: Gen:  NAD Cardiovascular:  HSIR, No M/R/G Respiratory:  Lungs with bilateral rhonchi, no wheeze Gastrointestinal:  Abdomen soft, NT/ND, + BS Extremities:  Trace edema   Data Reviewed:    Labs:  Basic Metabolic Panel:  Recent Labs Lab 04/03/14 2105 04/04/14 0130  NA 138 137  K 5.1 4.6  CL 101 98  CO2 25 25  GLUCOSE 95 111*  BUN 24* 23  CREATININE 1.47* 1.44*  CALCIUM 9.2 8.9   GFR Estimated Creatinine Clearance: 45 ml/min (by C-G formula based on Cr of 1.44). Liver Function Tests:  Recent Labs Lab 04/04/14 0130  AST 26  ALT 24  ALKPHOS 97  BILITOT 1.1  PROT 6.9  ALBUMIN 3.4*   Coagulation profile  Recent Labs Lab 04/04/14 0130  INR 1.15     CBC:  Recent Labs Lab 04/03/14 2105 04/04/14 0130  WBC 13.3* 13.1*  HGB 14.2 13.9  HCT 42.7 41.3  MCV 96.4 96.7  PLT 258 224   Cardiac Enzymes:  Recent Labs Lab 04/03/14 2105 04/04/14 0130  TROPONINI 0.40* 0.42*   BNP (last 3 results)  Recent Labs  04/03/14 2105  PROBNP 3436.0*   Thyroid function studies:  Recent Labs  04/04/14 0135  TSH 1.930   Microbiology Recent Results (from the past 240 hour(s))  MRSA PCR SCREENING     Status: None   Collection Time    04/04/14 12:59 AM      Result Value Ref Range Status   MRSA by PCR NEGATIVE  NEGATIVE Final   Comment:            The GeneXpert MRSA Assay (FDA     approved for NASAL specimens     only), is one component of a     comprehensive MRSA colonization     surveillance program. It is not     intended to diagnose MRSA     infection nor to guide or     monitor treatment for     MRSA infections.     Radiographs/Studies:   Dg Chest 2 View  04/03/2014   CLINICAL DATA:  Short of breath.  Cough.  EXAM: CHEST  2 VIEW  COMPARISON:  03/16/2014  FINDINGS: Cardiac silhouette is borderline enlarged. Normal mediastinal and hilar contours. Lungs are mildly hyperexpanded. There is mild scarring at the apices. No lung consolidation or edema. No pleural effusion or pneumothorax. Bony thorax is demineralized but intact.  IMPRESSION: No acute cardiopulmonary disease.   Electronically Signed   By: Lajean Manes M.D.   On: 04/03/2014 21:07    Medications:   . aspirin EC  81 mg Oral Daily  . atorvastatin  20 mg Oral q1800  . clonazePAM  0.5 mg Oral Daily  . clonazePAM  0.5 mg Oral QHS  . diltiazem  60 mg Oral 4 times per day  . ipratropium  0.5 mg Nebulization Q6H  . levalbuterol  0.63 mg Nebulization Q6H  . methylPREDNISolone (SOLU-MEDROL) injection  60 mg Intravenous Q6H  . nicotine  14 mg Transdermal Daily  . pantoprazole  40 mg Oral BID  . sodium chloride  3 mL Intravenous Q12H  . sodium chloride  3 mL  Intravenous Q12H  . tamsulosin  0.4 mg Oral Daily   Continuous Infusions: . diltiazem (CARDIZEM) infusion Stopped (04/04/14 0600)    Time spent: 35 minutes with > 50% of time discussing current diagnostic test results, clinical impression and plan of care.    LOS: 1 day   [place]  [facility] Pager [phone]. If unable to reach me by pager, please call my cell phone at [phone].  *Please refer to [email or web address], password [email or web address] to get updated schedule on who will round  on this patient, as hospitalists switch teams weekly. If 7PM-7AM, please contact night-coverage at [email or web address], password [email or web address] for any overnight needs.  04/04/2014, 7:05 AM    **Disclaimer: This note was dictated with voice recognition software. Similar sounding words can inadvertently be transcribed and this note may contain transcription errors which may not have been corrected upon publication of note.**   Information printed out and reviewed with the patient/family:     In an effort to keep you and your family informed about your hospital stay, I am providing you with this information sheet. If you or your family have any questions, please do not hesitate to have the nursing staff page me to set up a meeting time.  Also note that the hospitalist doctors typically change on Tuesdays or Wednesdays to a different hospitalist doctor.  Don [name] 04/04/2014 1 (Number of days in the hospital)  Treatment team:  Dr. Jacquelynn Cree, Hospitalist (Internist)  Dr. Jules Husbands, Cardiologist  Plan for today: We will obtain a 2-D echocardiogram which will tell us information about the health of your heart. We will continue medicines to help control your heart rate, as well as medicines to treat her shortness of breath/wheezing. Tests were done to determine if you have any problems with your thyroid (these were negative). Once you are medically stable, we will ask a gastroenterologist to evaluate you and  consider performing a colonoscopy to determine the source of your rectal bleeding.  Anticipated discharge date: 2-3 days.

## 2014-04-04 NOTE — Progress Notes (Signed)
Subjective:  Still complains of coughing and shortness of breath. He has been transitioned off of intravenous diltiazem and is on oral medicines now. Prior history of GI bleeding.  Objective:  Vital Signs in the last 24 hours: BP 113/62  Pulse 98  Temp(Src) 98.9 F (37.2 C) (Oral)  Resp 24  Ht 5\' 9"  (1.753 m)  Wt 74.7 kg (164 lb 10.9 oz)  BMI 24.31 kg/m2  SpO2 96%  Physical Exam: Elderly male who is currently coughing but otherwise in no acute distress Lungs: Mild rhonchi  Cardiac:  Irregular rhythm, normal S1 and S2, no S3 Extremities:  No edema present  Intake/Output from previous day: 05/22 0701 - 05/23 0700 In: 85 [I.V.:85] Out: 700 [Urine:700]  Weight Filed Weights   04/03/14 2033 04/04/14 0100 04/04/14 0500  Weight: 79.379 kg (175 lb) 74.2 kg (163 lb 9.3 oz) 74.7 kg (164 lb 10.9 oz)    Lab Results: Basic Metabolic Panel:  Recent Labs  04/03/14 2105 04/04/14 0130  NA 138 137  K 5.1 4.6  CL 101 98  CO2 25 25  GLUCOSE 95 111*  BUN 24* 23  CREATININE 1.47* 1.44*   CBC:  Recent Labs  04/03/14 2105 04/04/14 0130  WBC 13.3* 13.1*  HGB 14.2 13.9  HCT 42.7 41.3  MCV 96.4 96.7  PLT 258 224   Cardiac Enzymes:  Recent Labs  04/03/14 2105 04/04/14 0130 04/04/14 0651  TROPONINI 0.40* 0.42* <0.30    Telemetry: Atrial fibrillation rate is somewhat better controlled  Assessment/Plan:  1. Atrial fibrillation with rapid ventricular response improved 2. Minimal elevation of troponin with normalization today likely due 2 rapid atrial fibrillation rather than acute coronary syndrome 3. History of GI bleeding  Recommendations:  He may go to the floor at this time. Continue rate control. Await GI consultation. Hold off on anticoagulation until we know he is not going to bleed.    Kerry Hough  MD [facility]  04/04/2014, 8:08 AM

## 2014-04-04 NOTE — Progress Notes (Signed)
The order for simvastatin(Zocor) was changed to an equivalent dose of atorvastatin(Lipitor) due to the potential drug interaction with Lipitor.  When taken in combination with medications that inhibit its metabolism, simvastatin can accumulate which increases the risk of liver toxicity, myopathy, or rhabdomyolysis.  Simvastatin dose should not exceed 10mg /day in patients taking verapamil, diltiazem, fibrates, or niacin >or= 1g/day.   Simvastatin dose should not exceed 20mg /day in patients taking amlodipine, ranolazine or amiodarone.   Please consider this potential interaction at discharge.  Mechele Claude Morghan Kester 04/04/2014 1:16 AM

## 2014-04-04 NOTE — Progress Notes (Signed)
Rounding on patient whom is sitting in bed eating a baked potato. Upon discussing medication regimen patient appears to be choking and in distress, patient was asked are you choking he nodded yes and heimlich maneuver was performed successful ejecting a piece of potato from patient mouth. Will notify MD of event and continue to monitor patient.

## 2014-04-04 NOTE — Progress Notes (Signed)
  Echocardiogram 2D Echocardiogram has been performed.  Doyle Askew 04/04/2014, 9:19 AM

## 2014-04-04 NOTE — Consult Note (Signed)
Reason for Consult: atrial fibrillation + RVR with recent bleed Referring Physician: Dr. Sofie Rower Don [name] is an 73 y.o. male.  HPI: [name] is a 73 yo man with PMH of atrial fibrillation on digoxin/diltiazem/anticoagulated with xarelto, COPD, tobacco use (ongoing), hypertension who presented to [facility] with cough and wheezing. He also endorsed some chest discomfort with cough and/or deep breath. Some nausea.  He had dark stools 1-2 months ago leading to discontinuation of his xarelto on his own. He saw a Copywriter, advertising in February with plans for Colonoscopy that hasn't been completed yet. No recent dark/bloody stools but has had some previously. He tells me he already feels better and he didn't like taking the blood thinner. He is concerned his children are going to bring his wife home and she continues to need 24 hour care. He tells me he has quit smoking as of yesterday. No palpitations. No overt syncope.     Past Medical History  Diagnosis Date  . Hyperlipidemia   . Hypertension   . COPD (chronic obstructive pulmonary disease)   . Prostate cancer   . Tubular adenoma of colon 07/31/02, 11/18/03  . Atrial fibrillation   . Noncompliance with medications 04/2013    xarelto, digoxin  . Tobacco abuse     ongoing    Past Surgical History  Procedure Laterality Date  . 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 Anal abcess,hemorroids,    . Esophagogastroduodenoscopy  11/18/2003    Dr. Earle Gell  . Colonoscopy w/ biopsies  11/18/2003    Dr. Earle Gell  . Tonsillectomy      Family History  Problem Relation Age of Onset  . Diabetes Father   . Heart disease Father     MI  . Heart attack Mother   . Heart disease Brother   . Heart attack Father   . Lung cancer Brother   . Lung cancer Sister     Social History:  reports that he has been smoking Cigarettes and Cigars.  He has been smoking about 0.00 packs per day. He has never used smokeless tobacco. He  reports that he does not drink alcohol or use illicit drugs.  Allergies:  Allergies  Allergen Reactions  . Wellbutrin [Bupropion] Anxiety    Medications:  I have reviewed the patient's current medications. Prior to Admission:  Prescriptions prior to admission  Medication Sig Dispense Refill  . albuterol (PROVENTIL HFA;VENTOLIN HFA) 108 (90 BASE) MCG/ACT inhaler Inhale 2 puffs into the lungs every 6 (six) hours as needed for wheezing or shortness of breath.  1 Inhaler  0  . aspirin 81 MG tablet Take 81 mg by mouth daily.      . beta carotene w/minerals (OCUVITE) tablet Take 1 tablet by mouth daily.      . clonazePAM (KLONOPIN) 0.5 MG tablet Take 1 tablet (0.5 mg total) by mouth daily.  30 tablet  0  . diclofenac sodium (VOLTAREN) 1 % GEL Apply 2 g topically 4 (four) times daily.  1 Tube  1  . digoxin (LANOXIN) 0.25 MG tablet Take 1 tablet (0.25 mg total) by mouth daily.  90 tablet  1  . DiphenhydrAMINE HCl, Sleep, (SLEEP-AID MAXIMUM STRENGTH) 50 MG CAPS Take 1 capsule by mouth at bedtime.      . fish oil-omega-3 fatty acids 1000 MG capsule Take 2 g by mouth 2 (two) times daily.       . Multiple Vitamin (MULTIVITAMIN WITH MINERALS) TABS Take 1 tablet by  mouth daily.      [name] NITROSTAT 0.4 MG SL tablet DISSOLVE ONE TABLET UNDER THE TONGUE EVERY 5 MINUTES AS NEEDED FOR CHEST PAIN.  DO NOT EXCEED A TOTAL OF 3 DOSES IN 15 MINUTES  25 tablet  0  . Polyethyl Glycol-Propyl Glycol (SYSTANE OP) Place 1 drop into both eyes 2 (two) times daily.      [name] Umeclidinium-Vilanterol (ANORO ELLIPTA) 62.5-25 MCG/INH AEPB Inhale 1 puff into the lungs daily.      . pravastatin (PRAVACHOL) 80 MG tablet Take 1 tablet (80 mg total) by mouth daily.  90 tablet  1  . tamsulosin (FLOMAX) 0.4 MG CAPS Take 0.4 mg by mouth daily.      [name] tiotropium (SPIRIVA) 18 MCG inhalation capsule Place 1 capsule (18 mcg total) into inhaler and inhale as needed.  90 capsule  1   Scheduled: . aspirin EC  81 mg Oral Daily  . atorvastatin   20 mg Oral q1800  . clonazePAM  0.5 mg Oral Daily  . clonazePAM  0.5 mg Oral QHS  . ipratropium  0.5 mg Nebulization Q6H  . levalbuterol  0.63 mg Nebulization Q6H  . methylPREDNISolone (SOLU-MEDROL) injection  60 mg Intravenous Q6H  . nicotine  14 mg Transdermal Daily  . pantoprazole  40 mg Oral BID  . sodium chloride  3 mL Intravenous Q12H  . sodium chloride  3 mL Intravenous Q12H  . tamsulosin  0.4 mg Oral Daily   Continuous: . diltiazem (CARDIZEM) infusion 10 mg/hr (04/04/14 0149)    Results for orders placed during the hospital encounter of 04/03/14 (from the past 48 hour(s))  CBC     Status: Abnormal   Collection Time    04/03/14  9:05 PM      Result Value Ref Range   WBC 13.3 (*) 4.0 - 10.5 K/uL   RBC 4.43  4.22 - 5.81 MIL/uL   Hemoglobin 14.2  13.0 - 17.0 g/dL   HCT 42.7  39.0 - 52.0 %   MCV 96.4  78.0 - 100.0 fL   MCH 32.1  26.0 - 34.0 pg   MCHC 33.3  30.0 - 36.0 g/dL   RDW 13.8  11.5 - 15.5 %   Platelets 258  150 - 400 K/uL  PRO B NATRIURETIC PEPTIDE     Status: Abnormal   Collection Time    04/03/14  9:05 PM      Result Value Ref Range   Pro B Natriuretic peptide (BNP) 3436.0 (*) 0 - 125 pg/mL  TROPONIN I     Status: Abnormal   Collection Time    04/03/14  9:05 PM      Result Value Ref Range   Troponin I 0.40 (*) <0.30 ng/mL   Comment:            Due to the release kinetics of cTnI,     a negative result within the first hours     of the onset of symptoms does not rule out     myocardial infarction with certainty.     If myocardial infarction is still suspected,     repeat the test at appropriate intervals.     CRITICAL RESULT CALLED TO, READ BACK BY AND VERIFIED WITH:     WINN,T ON 04/02/14 AT 2220 BY LOY,C  BASIC METABOLIC PANEL     Status: Abnormal   Collection Time    04/03/14  9:05 PM      Result Value Ref Range  Sodium 138  137 - 147 mEq/L   Potassium 5.1  3.7 - 5.3 mEq/L   Chloride 101  96 - 112 mEq/L   CO2 25  19 - 32 mEq/L   Glucose, Bld 95   70 - 99 mg/dL   BUN 24 (*) 6 - 23 mg/dL   Creatinine, Ser 1.47 (*) 0.50 - 1.35 mg/dL   Calcium 9.2  8.4 - 10.5 mg/dL   GFR calc non Af Amer 45 (*) >90 mL/min   GFR calc Af Amer 52 (*) >90 mL/min   Comment: (NOTE)     The eGFR has been calculated using the CKD EPI equation.     This calculation has not been validated in all clinical situations.     eGFR's persistently <90 mL/min signify possible Chronic Kidney     Disease.  DIGOXIN LEVEL     Status: Abnormal   Collection Time    04/03/14  9:05 PM      Result Value Ref Range   Digoxin Level 0.4 (*) 0.8 - 2.0 ng/mL  URINALYSIS, ROUTINE W REFLEX MICROSCOPIC     Status: Abnormal   Collection Time    04/03/14 11:08 PM      Result Value Ref Range   Color, Urine YELLOW  YELLOW   APPearance CLEAR  CLEAR   Specific Gravity, Urine 1.025  1.005 - 1.030   pH 5.5  5.0 - 8.0   Glucose, UA NEGATIVE  NEGATIVE mg/dL   Hgb urine dipstick MODERATE (*) NEGATIVE   Bilirubin Urine NEGATIVE  NEGATIVE   Ketones, ur NEGATIVE  NEGATIVE mg/dL   Protein, ur 100 (*) NEGATIVE mg/dL   Urobilinogen, UA 0.2  0.0 - 1.0 mg/dL   Nitrite NEGATIVE  NEGATIVE   Leukocytes, UA NEGATIVE  NEGATIVE  URINE MICROSCOPIC-ADD ON     Status: None   Collection Time    04/03/14 11:08 PM      Result Value Ref Range   Squamous Epithelial / LPF RARE  RARE   WBC, UA 0-2  <3 WBC/hpf   RBC / HPF 0-2  <3 RBC/hpf   Bacteria, UA RARE  RARE  CBC     Status: Abnormal   Collection Time    04/04/14  1:30 AM      Result Value Ref Range   WBC 13.1 (*) 4.0 - 10.5 K/uL   RBC 4.27  4.22 - 5.81 MIL/uL   Hemoglobin 13.9  13.0 - 17.0 g/dL   HCT 41.3  39.0 - 52.0 %   MCV 96.7  78.0 - 100.0 fL   MCH 32.6  26.0 - 34.0 pg   MCHC 33.7  30.0 - 36.0 g/dL   RDW 13.8  11.5 - 15.5 %   Platelets 224  150 - 400 K/uL    Dg Chest 2 View  04/03/2014   CLINICAL DATA:  Short of breath.  Cough.  EXAM: CHEST  2 VIEW  COMPARISON:  03/16/2014  FINDINGS: Cardiac silhouette is borderline enlarged. Normal  mediastinal and hilar contours. Lungs are mildly hyperexpanded. There is mild scarring at the apices. No lung consolidation or edema. No pleural effusion or pneumothorax. Bony thorax is demineralized but intact.  IMPRESSION: No acute cardiopulmonary disease.   Electronically Signed   By: Lajean Manes M.D.   On: 04/03/2014 21:07    Review of Systems  Constitutional: Positive for malaise/fatigue. Negative for fever, chills and diaphoresis.  HENT: Negative for ear discharge.   Eyes: Negative for blurred vision and pain.  Respiratory: Positive for cough and sputum production. Negative for hemoptysis and wheezing.   Cardiovascular: Negative for chest pain, palpitations and orthopnea.  Gastrointestinal: Negative for nausea, vomiting and abdominal pain.  Genitourinary: Negative for dysuria, frequency and hematuria.  Musculoskeletal: Negative for myalgias and neck pain.  Skin: Negative for rash.  Neurological: Negative for dizziness, tingling, tremors and headaches.  Endo/Heme/Allergies: Negative for environmental allergies. Does not bruise/bleed easily.  Psychiatric/Behavioral: Negative for depression, suicidal ideas and hallucinations.   Blood pressure 153/95, pulse 96, temperature 99 F (37.2 C), temperature source Oral, resp. rate 19, height $RemoveBe'5\' 9"'[id number]$  (1.753 m), weight 74.2 kg (163 lb 9.3 oz), SpO2 97.00%. Physical Exam  Nursing note and vitals reviewed. Constitutional: He is oriented to person, place, and time. He appears well-developed and well-nourished. No distress.  HENT:  Head: Normocephalic and atraumatic.  Nose: Nose normal.  Mouth/Throat: Oropharynx is clear and moist. No oropharyngeal exudate.  Eyes: Conjunctivae and EOM are normal. Pupils are equal, round, and reactive to light. No scleral icterus.  Neck: Normal range of motion. Neck supple. No thyromegaly present.  jvp 2 cm above clavicle  Cardiovascular: Intact distal pulses.  Exam reveals no gallop.   No murmur  heard. Tachycardic, irregularly irregular  Respiratory: Effort normal. No respiratory distress. He has no wheezes. He has rales.  GI: Soft. Bowel sounds are normal. He exhibits no distension. There is no tenderness. There is no rebound.  Musculoskeletal: Normal range of motion. He exhibits no edema and no tenderness.  Neurological: He is alert and oriented to person, place, and time. No cranial nerve deficit. Coordination normal.  Skin: Skin is warm and dry. No rash noted. He is not diaphoretic. No erythema.  Psychiatric: He has a normal mood and affect. His behavior is normal. Thought content normal.   BNP 3400, Troponin 0.42 to 0.4 Cr 1.5, K 5.1 Echo 6/14: EF 55-60%, moderate LVH    Problem List  Atrial fibrillation + RVR Shortness of Breath/COPD exacerbation/bronchitis  Chronic Kidney Disease  Troponinemia/Type II NSTEMI - demand ischemia Tobacco use  Assessment/Plan: 73 yo man with PMH of CKD, COPD, hypertension, tobacco use ongoing, atrial fibrillation, rectal bleeding currently off anticoagulation with atrial fibrillation who presents with cough/shortness of breath and also found to have atrial fibrillation with RVR and mildly elevated troponin. CHADS2VASC 2 (age, hypertension). Aggravating atrial fibrillation etiologies include idiopathic, infection, thyroid disorders, MI, heart failure. After GI/rectal bleeding sorted out, anticoagulation and potential TEE-DCCV can be discussed.  - on diltiazem gtt for rate control; will transition to oral diltiazem as able - reasonable to defer anticoagulation for atrial fibrillation until GI consult in AM given profuse rectal bleeding on xarelto - check tsh, bnp, update echocardiogram, urinalysis, trend troponins, telemetry  - will reassess with GI consultation +/- Endoscopy to determine ability and/or timing of reinitiation of anticoagulation   Jules Husbands 04/04/2014, 2:26 AM

## 2014-04-05 ENCOUNTER — Encounter (HOSPITAL_COMMUNITY): Payer: Self-pay | Admitting: Gastroenterology

## 2014-04-05 ENCOUNTER — Encounter (HOSPITAL_COMMUNITY)
Admission: EM | Disposition: A | Discharge status: Home Health | Payer: Self-pay | Source: Home / Self Care | Attending: Internal Medicine

## 2014-04-05 DIAGNOSIS — D126 Benign neoplasm of colon, unspecified: Secondary | ICD-10-CM

## 2014-04-05 HISTORY — PX: COLONOSCOPY: SHX5424

## 2014-04-05 HISTORY — PX: ESOPHAGOGASTRODUODENOSCOPY: SHX5428

## 2014-04-05 LAB — CBC
HCT: 40.7 % (ref 39.0–52.0)
Hemoglobin: 13.7 g/dL (ref 13.0–17.0)
MCH: 32 pg (ref 26.0–34.0)
MCHC: 33.7 g/dL (ref 30.0–36.0)
MCV: 95.1 fL (ref 78.0–100.0)
Platelets: 248 10*3/uL (ref 150–400)
RBC: 4.28 MIL/uL (ref 4.22–5.81)
RDW: 13.8 % (ref 11.5–15.5)
WBC: 14.2 10*3/uL — ABNORMAL HIGH (ref 4.0–10.5)

## 2014-04-05 LAB — BASIC METABOLIC PANEL
BUN: 33 mg/dL — ABNORMAL HIGH (ref 6–23)
CALCIUM: 8.8 mg/dL (ref 8.4–10.5)
CO2: 24 meq/L (ref 19–32)
CREATININE: 1.66 mg/dL — AB (ref 0.50–1.35)
Chloride: 101 mEq/L (ref 96–112)
GFR calc Af Amer: 45 mL/min — ABNORMAL LOW (ref >90)
GFR, EST NON AFRICAN AMERICAN: 39 mL/min — AB (ref >90)
Glucose, Bld: 187 mg/dL — ABNORMAL HIGH (ref 70–99)
Potassium: 4.4 mEq/L (ref 3.7–5.3)
SODIUM: 140 meq/L (ref 137–147)

## 2014-04-05 SURGERY — COLONOSCOPY
Anesthesia: Moderate Sedation

## 2014-04-05 MED ORDER — MIDAZOLAM HCL 5 MG/5ML IJ SOLN
INTRAMUSCULAR | Status: DC | PRN
  Administered 2014-04-05 (×2): 2 mg via INTRAVENOUS
  Administered 2014-04-05: 1 mg via INTRAVENOUS
  Administered 2014-04-05: 2 mg via INTRAVENOUS

## 2014-04-05 MED ORDER — MIDAZOLAM HCL 10 MG/2ML IJ SOLN
INTRAMUSCULAR | Status: AC
Start: 2014-04-05 — End: 2014-04-05
  Filled 2014-04-05: qty 2

## 2014-04-05 MED ORDER — BUTAMBEN-TETRACAINE-BENZOCAINE 2-2-14 % EX AERO
INHALATION_SPRAY | CUTANEOUS | Status: DC | PRN
  Administered 2014-04-05: 1 via TOPICAL

## 2014-04-05 MED ORDER — FENTANYL CITRATE 0.05 MG/ML IJ SOLN
INTRAMUSCULAR | Status: AC
  Filled 2014-04-05: qty 2

## 2014-04-05 MED ORDER — FENTANYL CITRATE 0.05 MG/ML IJ SOLN
INTRAMUSCULAR | Status: DC | PRN
  Administered 2014-04-05 (×3): 25 ug via INTRAVENOUS

## 2014-04-05 NOTE — Op Note (Addendum)
[facility] [place], [address]   COLONOSCOPY PROCEDURE REPORT  PATIENT: Don [name], Don [name]  MR#: [id number] BIRTHDATE: [birth year] , 89  yrs. old GENDER: Male ENDOSCOPIST: Ladene Artist, MD, [facility] REFERRED BY:.  Hospital lists, [facility] PROCEDURE DATE:  04/05/2014 PROCEDURE:   Colonoscopy with snare polypectomy First Screening Colonoscopy - Avg.  risk and is 50 yrs.  old or older - No.  Prior Negative Screening - Now for repeat screening. N/A  History of Adenoma - Now for follow-up colonoscopy & has been > or = to 3 yrs.  N/A  Polyps Removed Today? Yes. ASA CLASS:   Class III INDICATIONS: heme-positive stool and hematochezia and personal history of adenomatous colon polyps. MEDICATIONS: These medications were titrated to patient response per physician's verbal order, Fentanyl 75 mcg IV, and Versed 6 mg IV DESCRIPTION OF PROCEDURE:   After the risks benefits and alternatives of the procedure were thoroughly explained, informed consent was obtained.  A digital rectal exam revealed no abnormalities of the rectum.   The Pentax Ped Colon [id number] endoscope was introduced through the anus and advanced to the cecum, which was identified by both the appendix and ileocecal valve. No adverse events experienced.   The quality of the prep was adequate, using MoviPrep  The instrument was then slowly withdrawn as the colon was fully examined.  COLON FINDINGS: A sessile polyp measuring 6 mm in size was found in the sigmoid colon.  A polypectomy was performed with a cold snare. The resection was complete and the polyp tissue was completely retrieved.   The colon was otherwise normal.  There was no diverticulosis, inflammation, polyps or cancers unless previously stated.  Retroflexed views revealed small internal hemorrhoids. The time to cecum=3 minutes 00 seconds.  Withdrawal time=13 minutes 00 seconds.  The scope was withdrawn and the procedure  completed. COMPLICATIONS: There were no complications.  ENDOSCOPIC IMPRESSION: 1.   Sessile polyp measuring 6 mm in the sigmoid colon; polypectomy performed with a cold snare 2.   Small internal hemorrhoids  RECOMMENDATIONS: 1.  Await pathology results 2.  Repeat colonoscopy in 5 years if polyp with Dr. Carlean Purl 3.  OK to start Coumadin tomorrow. If a faster acting anticoagulant is used OK to start in 3 days.  eSigned:  Ladene Artist, MD, [facility] 04/05/2014 11:08 AM Revised: 04/05/2014 11:08 AM  cc: Silvano Rusk, MD

## 2014-04-05 NOTE — H&P (View-Only) (Signed)
Referring Provider: [facility] Primary Care Physician:  Anthoney Harada, MD Primary Gastroenterologist:  Dr.Gessner  Reason for Consultation:  Hx of rectal bleeding, heme + stool, needs anticoagulation  HPI: Don [name] is a 73 y.o. male  who  was admitted in transfer here from [facility] last night after he had presented there with worsening cough. He was found to be in rapid atrial fib with mildly elevated troponins and an elevated BNP of 3400+. He has been evaluated by cardiology and rate is currently controlled with Cardizem. It is felt he will need chronic anticoagulation and we are asked to see him for colonoscopy given history of rectal bleeding. Patient is known to Dr. Carlean Purl and had seen him in February of 2015 for rectal bleeding of dark red blood when he was on Xarelto for a  short period of time. He was to have colonoscopy but was waiting for a change in his insurance and colonoscopy has not been done. Patient says he has not seen any blood over the past couple of months. He has no complaints of abdominal pain or rectal pain. He does have occasional loose stools and occasional episodes of fecal soilage. He gives history of remote perianal abscess for which he had surgery. He also has history of prostate cancer and is status post prostatectomy followed by radiation. Other medical problems include hyperlipidemia hypertension COPD and stage III chronic kidney disease. He has remote history of colon polyps with last colonoscopy done in 2005 by Dr. Howell Rucks. Patient has had her recent increase in coughing and was diagnosed with bronchitis as an outpatient, placed on antibiotics which he did not think were helping. He actually had a choking episode at lunchtime today and had to have the Heimlich maneuver performed. He denies any recent choking episodes at home or dysphagia. CT scan of the chest done this month shows COPD and bronchiectasis, chest x-ray done on admission no  pulmonary edema 2-D echo done earlier today as an EF of 45% Hemoglobin is in the 13 range.   Past Medical History  Diagnosis Date  . Hyperlipidemia   . Hypertension   . COPD (chronic obstructive pulmonary disease)   . Prostate cancer   . Tubular adenoma of colon 07/31/02, 11/18/03  . Atrial fibrillation   . Noncompliance with medications 04/2013    xarelto, digoxin  . Tobacco abuse     ongoing  . Stage III chronic kidney disease 04/03/2014    Past Surgical History  Procedure Laterality Date  . 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 Anal abcess,hemorroids,    . Esophagogastroduodenoscopy  11/18/2003    Dr. Earle Gell  . Colonoscopy w/ biopsies  11/18/2003    Dr. Earle Gell  . Tonsillectomy      Prior to Admission medications   Medication Sig Start Date End Date Taking? Authorizing Provider  albuterol (PROVENTIL HFA;VENTOLIN HFA) 108 (90 BASE) MCG/ACT inhaler Inhale 2 puffs into the lungs every 6 (six) hours as needed for wheezing or shortness of breath. 03/16/14  Yes Lysbeth Penner, FNP  aspirin 81 MG tablet Take 81 mg by mouth daily.   Yes Historical Provider, MD  beta carotene w/minerals (OCUVITE) tablet Take 1 tablet by mouth daily.   Yes Historical Provider, MD  clonazePAM (KLONOPIN) 0.5 MG tablet Take 1 tablet (0.5 mg total) by mouth daily. 02/26/14  Yes Vernie Shanks, MD  diclofenac sodium (VOLTAREN) 1 % GEL Apply 2 g topically 4 (four) times daily. 10/16/13  Yes Vernie Shanks, MD  digoxin (LANOXIN) 0.25 MG tablet Take 1 tablet (0.25 mg total) by mouth daily. 12/15/13  Yes Vernie Shanks, MD  DiphenhydrAMINE HCl, Sleep, (SLEEP-AID MAXIMUM STRENGTH) 50 MG CAPS Take 1 capsule by mouth at bedtime.   Yes Historical Provider, MD  fish oil-omega-3 fatty acids 1000 MG capsule Take 2 g by mouth 2 (two) times daily.    Yes Historical Provider, MD  Multiple Vitamin (MULTIVITAMIN WITH MINERALS) TABS Take 1 tablet by mouth daily.   Yes Historical Provider, MD  nitroGLYCERIN  (NITROSTAT) 0.4 MG SL tablet Place 0.4 mg under the tongue every 5 (five) minutes as needed for chest pain.   Yes Historical Provider, MD  Polyethyl Glycol-Propyl Glycol (SYSTANE OP) Place 1 drop into both eyes 2 (two) times daily as needed (dry eyes).    Yes Historical Provider, MD  pravastatin (PRAVACHOL) 80 MG tablet Take 1 tablet (80 mg total) by mouth daily. 12/15/13  Yes Vernie Shanks, MD  tiotropium (SPIRIVA) 18 MCG inhalation capsule Place 18 mcg into inhaler and inhale daily as needed (shortness of breath).   Yes Historical Provider, MD  Umeclidinium-Vilanterol (ANORO ELLIPTA) 62.5-25 MCG/INH AEPB Inhale 1 puff into the lungs daily.   Yes Historical Provider, MD    Current Facility-Administered Medications  Medication Dose Route Frequency Provider Last Rate Last Dose  . 0.9 %  sodium chloride infusion  250 mL Intravenous PRN Bonnielee Haff, MD      . acetaminophen (TYLENOL) tablet 650 mg  650 mg Oral Q6H PRN Bonnielee Haff, MD       Or  . acetaminophen (TYLENOL) suppository 650 mg  650 mg Rectal Q6H PRN Bonnielee Haff, MD      . aspirin EC tablet 81 mg  81 mg Oral Daily Bonnielee Haff, MD   81 mg at 04/04/14 0148  . atorvastatin (LIPITOR) tablet 20 mg  20 mg Oral q1800 Bonnielee Haff, MD      . clonazePAM Bobbye Charleston) tablet 0.5 mg  0.5 mg Oral Daily Bonnielee Haff, MD   0.5 mg at 04/04/14 1052  . clonazePAM (KLONOPIN) tablet 0.5 mg  0.5 mg Oral QHS Ritta Slot, NP   0.5 mg at 04/04/14 0148  . diltiazem (CARDIZEM) 100 mg in dextrose 5 % 100 mL infusion  5-15 mg/hr Intravenous Continuous Alfonzo Feller, DO   5 mg/hr at 04/04/14 0400  . diltiazem (CARDIZEM) tablet 60 mg  60 mg Oral 4 times per day Jules Husbands, MD   60 mg at 04/04/14 1246  . ipratropium (ATROVENT) nebulizer solution 0.5 mg  0.5 mg Nebulization Q6H Bonnielee Haff, MD   0.5 mg at 04/04/14 1008  . ketotifen (ZADITOR) 0.025 % ophthalmic solution 2 drop  2 drop Both Eyes BID Venetia Maxon Rama, MD   2 drop at 04/04/14 1100  .  levalbuterol (XOPENEX) nebulizer solution 0.63 mg  0.63 mg Nebulization Q6H Bonnielee Haff, MD   0.63 mg at 04/04/14 1008  . levalbuterol (XOPENEX) nebulizer solution 0.63 mg  0.63 mg Nebulization Q6H PRN Bonnielee Haff, MD      . methylPREDNISolone sodium succinate (SOLU-MEDROL) 125 mg/2 mL injection 60 mg  60 mg Intravenous Q6H Bonnielee Haff, MD   60 mg at 04/04/14 1057  . morphine 2 MG/ML injection 1 mg  1 mg Intravenous Q4H PRN Bonnielee Haff, MD   1 mg at 04/04/14 0449  . nicotine (NICODERM CQ - dosed in mg/24 hours) patch 14 mg  14 mg Transdermal Daily  Bonnielee Haff, MD   14 mg at 04/04/14 0148  . ondansetron (ZOFRAN) tablet 4 mg  4 mg Oral Q6H PRN Bonnielee Haff, MD       Or  . ondansetron (ZOFRAN) injection 4 mg  4 mg Intravenous Q6H PRN Bonnielee Haff, MD      . oxyCODONE (Oxy IR/ROXICODONE) immediate release tablet 5 mg  5 mg Oral Q4H PRN Bonnielee Haff, MD      . pantoprazole (PROTONIX) EC tablet 40 mg  40 mg Oral BID Bonnielee Haff, MD   40 mg at 04/04/14 1052  . polyvinyl alcohol (LIQUIFILM TEARS) 1.4 % ophthalmic solution 1 drop  1 drop Both Eyes BID Venetia Maxon Rama, MD   1 drop at 04/04/14 1100  . sodium chloride 0.9 % injection 3 mL  3 mL Intravenous Q12H Bonnielee Haff, MD   3 mL at 04/04/14 0149  . sodium chloride 0.9 % injection 3 mL  3 mL Intravenous Q12H Bonnielee Haff, MD   3 mL at 04/04/14 1102  . sodium chloride 0.9 % injection 3 mL  3 mL Intravenous PRN Bonnielee Haff, MD      . tamsulosin (FLOMAX) capsule 0.4 mg  0.4 mg Oral Daily Bonnielee Haff, MD        Allergies as of 04/03/2014 - Review Complete 04/03/2014  Allergen Reaction Noted  . Wellbutrin [bupropion] Anxiety 03/21/2013    Family History  Problem Relation Age of Onset  . Diabetes Father   . Heart disease Father     MI  . Heart attack Mother   . Heart disease Brother   . Heart attack Father   . Lung cancer Brother   . Lung cancer Sister     History   Social History  . Marital Status: Married      Spouse Name: N/A    Number of Children: 0  . Years of Education: N/A   Occupational History  . retired    Social History Main Topics  . Smoking status: Current Every Day Smoker    Types: Cigarettes, Cigars  . Smokeless tobacco: Never Used  . Alcohol Use: No  . Drug Use: No  . Sexual Activity: Not on file   Other Topics Concern  . 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 Married to his second wife, she is in a nursing home.    Review of Systems: ROS negative except as in HPI   Physical Exam: Vital signs in last 24 hours: Temp:  [97.6 F (36.4 C)-99.2 F (37.3 C)] 97.6 F (36.4 C) (05/23 1200) Pulse Rate:  [49-163] 84 (05/23 1200) Resp:  [19-36] 22 (05/23 1200) BP: (113-153)/(62-109) 129/74 mmHg (05/23 1200) SpO2:  [89 %-99 %] 96 % (05/23 1200) Weight:  [163 lb 9.3 oz (74.2 kg)-175 lb (79.379 kg)] 164 lb 10.9 oz (74.7 kg) (05/23 0500) Last BM Date: 04/04/14 General:   Alert, well-developed,elderly WM pleasant and cooperative in NAD, coughing Head:  Normocephalic and atraumatic. Eyes:  Sclera clear, no icterus.   Conjunctiva pink. Ears:  Normal auditory acuity. Nose:  No deformity, discharge,  or lesions. Mouth:  No deformity or lesions.   Neck:  Supple; no masses or thyromegaly. Lungs: wheezes bilaterally left>right.  Heart: irregular rate and rhythm; no murmurs, clicks, rubs,  or gallops. Abdomen:  Soft,nontender, BS active,nonpalp mass or hsm.   Rectal:  Deferred  Msk:  Symmetrical without gross deformities. . Pulses:  Normal pulses noted. Extremities:  Without clubbing or edema. Neurologic:  Alert and  oriented x4;  grossly normal neurologically. Skin:  Intact without significant lesions or rashes.. Psych:  Alert and cooperative. Normal mood and affect.  Intake/Output from previous day: 05/22 0701 - 05/23 0700 In: 85 [I.V.:85] Out: 700 [Urine:700] Intake/Output this shift: Total I/O In:  3 [I.V.:3] Out: -   Lab Results: BNP 3400+,   Recent Labs  04/03/14 2105 04/04/14 0130  WBC 13.3* 13.1*  HGB 14.2 13.9  HCT 42.7 41.3  PLT 258 224   BMET  Recent Labs  04/03/14 2105 04/04/14 0130  NA 138 137  K 5.1 4.6  CL 101 98  CO2 25 25  GLUCOSE 95 111*  BUN 24* 23  CREATININE 1.47* 1.44*  CALCIUM 9.2 8.9   LFT  Recent Labs  04/04/14 0130  PROT 6.9  ALBUMIN 3.4*  AST 26  ALT 24  ALKPHOS 97  BILITOT 1.1   PT/INR  Recent Labs  04/04/14 0130  LABPROT 14.5  INR 1.15     IMPRESSION:  #69  72 year old white male admitted with atrial fibrillation with RVR, mild elevation in troponins and elevated BNP. Initially felt due to demand ischemia and now per cardiology secondary to rapid atrial fib Patient has had no complaints of chest pain or shortness of breath, rate currently controlled with by mouth Cardizem. Patient needs chronic anticoagulation. #2 history of rectal bleeding earlier this year, heme + stool on admission. Colonoscopy had been scheduled but was not completed. Patient denies any recent rectal bleeding, he is not anemic. Rule out occult colon lesion rule out bleeding secondary to internal hemorrhoids or possibly radiation proctitis intermittently. #3 history of adenomatous colon polyps last colonoscopy 2005 #4 COPD, and probable bronchiectasis #5 recent cough-question secondary to bronchitis #6 stage III chronic kidney disease #7 history of prostate cancer status post prostatectomy and radiation   PLAN: Will schedule for colonoscopy tomorrow with Dr. Fuller Plan procedure discussed in detail with the patient and he is agreeable to proceed. Start clear liquids and then n.p.o. after midnight.   Amy S S[place]  04/04/2014, 2:34 PM     Attending physician's note   I have taken a history, examined the patient and reviewed the chart. I agree with the Advanced Practitioner's note, impression and recommendations. Heme + stool on admission, history of  small volume rectal bleeding earlier this year and personal history of adenomatous colon polyps in 2005. Anticoagulation is anticipated for mgmt of afib. Colonoscopy tomorrow. Bowel prep today. The risks, benefits, and alternatives to colonoscopy with possible biopsy and possible polypectomy were discussed with the patient and they consent to proceed.    Ladene Artist, MD [facility]

## 2014-04-05 NOTE — Interval H&P Note (Signed)
History and Physical Interval Note:  04/05/2014 9:58 AM  Don [name]  has presented today for surgery, with the diagnosis of GI bleeding  The various methods of treatment have been discussed with the patient and family. After consideration of risks, benefits and other options for treatment, the patient has consented to  Procedure(s): COLONOSCOPY (N/A) and UPPER ENDOSCOPY as a surgical intervention.  [name] history has been reviewed, patient examined, no change in status, stable for surgery.  I have reviewed the patient's chart and labs.  Questions were answered to the patient's satisfaction.     Ladene Artist MD

## 2014-04-05 NOTE — Op Note (Signed)
[facility] [place], [address]   ENDOSCOPY PROCEDURE REPORT  PATIENT: Don [name], Don [name]  MR#: [id number] BIRTHDATE: Dec 13, 1940 , 11  yrs. old GENDER: Male ENDOSCOPIST: Ladene Artist, MD, [facility] REFERRED BY:  [facility] PROCEDURE DATE:  04/05/2014 PROCEDURE:  EGD w/ biopsy ASA CLASS:     Class III INDICATIONS:  Heme positive stool. MEDICATIONS: There was residual sedation effect present from prior procedure, These medications were titrated to patient response per physician's verbal order, and Versed 1mg  IV TOPICAL ANESTHETIC: Cetacaine Spray DESCRIPTION OF PROCEDURE: After the risks benefits and alternatives of the procedure were thoroughly explained, informed consent was obtained.  The Pentax Gastroscope [id number] endoscope was introduced through the mouth and advanced to the second portion of the duodenum. Without limitations.  The instrument was slowly withdrawn as the mucosa was fully examined.  ESOPHAGUS: A variable Z-line was observed 38 cm from the incisors. Multiple biopsies were performed. R/O Barrett's. The esophagus was otherwise normal. STOMACH: The mucosa and of the stomach appeared normal.  Small volume of retained solids in the gastric body and fundus. Pt repositioned with adequate visualization of the stomach. DUODENUM: The duodenal mucosa showed no abnormalities.  Retroflexed views revealed a small hiatal hernia.     The scope was then withdrawn from the patient and the procedure completed.  COMPLICATIONS: There were no complications.  ENDOSCOPIC IMPRESSION: 1.   Variable Z-line 38 cm from the incisors; multiple biopsies; R/O Barrett's 2.   Small hiatal hernia  RECOMMENDATIONS: 1.  Anti-reflux regimen 2.  PPI qam long term 3.  Await pathology results. If Barretts noted consider EGD with Dr. Carlean Purl in 3 years  eSigned:  Ladene Artist, MD, [facility] 04/05/2014 11:05 AM   NF:AOZH Carlean Purl, MD

## 2014-04-05 NOTE — Progress Notes (Signed)
Subjective:  Still coughing.  For colonoscopy today.  Less SOB.  Objective:  Vital Signs in the last 24 hours: BP 126/80  Pulse 107  Temp(Src) 97.3 F (36.3 C) (Oral)  Resp 22  Ht 5\' 9"  (1.753 m)  Wt 75.7 kg (166 lb 14.2 oz)  BMI 24.63 kg/m2  SpO2 91%  Physical Exam: Elderly male who is  in no acute distress Lungs: Mild rhonchi  Cardiac:  Irregular rhythm, normal S1 and S2, no S3 Extremities:  No edema present  Intake/Output from previous day: 05/23 0701 - 05/24 0700 In: 86 [I.V.:86] Out: -   Weight Filed Weights   04/04/14 0100 04/04/14 0500 04/05/14 0546  Weight: 74.2 kg (163 lb 9.3 oz) 74.7 kg (164 lb 10.9 oz) 75.7 kg (166 lb 14.2 oz)    Lab Results: Basic Metabolic Panel:  Recent Labs  04/04/14 0130 04/05/14 0418  NA 137 140  K 4.6 4.4  CL 98 101  CO2 25 24  GLUCOSE 111* 187*  BUN 23 33*  CREATININE 1.44* 1.66*   CBC:  Recent Labs  04/04/14 0130 04/05/14 0418  WBC 13.1* 14.2*  HGB 13.9 13.7  HCT 41.3 40.7  MCV 96.7 95.1  PLT 224 248   Cardiac Enzymes:  Recent Labs  04/04/14 0130 04/04/14 0651 04/04/14 1304  TROPONINI 0.42* <0.30 <0.30    Telemetry: Atrial fibrillation rate around 100 now.  Assessment/Plan:  1. Atrial fibrillation with rapid ventricular response improved 2. Minimal elevation of troponin with normalization today likely due 2 rapid atrial fibrillation rather than acute coronary syndrome 3. History of GI bleeding  Recommendations:  For colonoscopy today.  Depending on results may anticoagulate afterward.    Kerry Hough  MD [facility]  04/05/2014, 9:17 AM

## 2014-04-05 NOTE — Progress Notes (Signed)
Progress Note   Don [name]:[id number] DOB: 10-17-1941 DOA: 04/03/2014 PCP: Anthoney Harada, MD   Brief Narrative:   Don [name] is an 73 y.o. male with PMH of atrial fibrillation on digoxin/diltiazem/anticoagulated with xarelto, COPD with ongoing tobacco use and hypertension who presented to [facility] 04/03/14 with cough, wheezing, chest discomfort and nausea. The patient had stopped his Xarelto 1-2 months ago secondary to dark stools. Upon initial evaluation in the ER, the patient was found to be in atrial fibrillation with rapid ventricular response, likely triggered by a COPD exacerbation. His initial troponins were mildly elevated. He subsequently was transferred to [facility] for cardiology and GI consultation.   Assessment/Plan:   Principal Problem:   Atrial fibrillation with RVR / elevated troponin / demand ischemia  Patient was placed on a Cardizem drip and his digoxin was held.  Rate now controlled on Cardizem PO.  Cardiology consultation performed by Dr. Claiborne Billings 04/04/14 who recommended that we reconsider anticoagulation after GI evaluation.  TSH WNL at 1.930. Pro BNP elevated at 3436.  2-D echo done 04/04/14: EF 45%, with inferior/septal hypokinesis.  Troponin elevation likely from demand ischemia.  Active Problems: Hyperlipidemia  Zocor changed to an equivalent dose of Lipitor due to the potential drug interaction with Cardizem. Will need to be discharged on Lipitor if he goes home on Cardizem.  COPD (chronic obstructive pulmonary disease) with acute bronchitis  Chest x-ray negative for infiltrates, so empiric Levaquin discontinued.  Continue Solu-Medrol, wean as tolerated.  Continue Xopenex.  Hypertension  Blood pressure currently controlled on Cardizem.  Tobacco abuse  Tobacco cessation counseling provided.  Continue nicotine patch.  Heme positive stool  No anticoagulation until GI evaluation performed. [facility] consultation  requested.  Continue PPI therapy.  For colonoscopy today.  History of prostate cancer  Not on active treatment. S/P prostatectomy/radiation therapy.  Stage III CKD (chronic kidney disease)  Patient's GFR ranges from 52-60, consistent with stage III chronic kidney disease.  Baseline creatinine 1.4-1.6. Current creatinine consistent with usual baseline values.  DVT Prophylaxis  Code Status: Full. Family Communication: No family at the bedside. Disposition Plan: Home when stable.   IV Access:    Peripheral IV   Procedures:    2 D Echo 04/04/14: EF 45%, with inferior/septal hypokinesis.   Medical Consultants:    Dr. Jules Husbands, Cardiology.  Dr. Lucio Edward, Gastroenterology.   Other Consultants:    None.   Anti-Infectives:    Levaquin 04/03/14---> 04/03/14  Subjective:    Don [name] is sedated post procedure, sleeping comfortably.  Objective:    Filed Vitals:   04/04/14 1929 04/04/14 2123 04/05/14 0526 04/05/14 0546  BP:  135/77 126/80   Pulse:  106 107   Temp:  98.2 F (36.8 C) 97.3 F (36.3 C)   TempSrc:  Oral Oral   Resp:  22 22   Height:      Weight:    75.7 kg (166 lb 14.2 oz)  SpO2: 92% 96% 97%     Intake/Output Summary (Last 24 hours) at 04/05/14 0829 Last data filed at 04/04/14 2229  Gross per 24 hour  Intake     76 ml  Output      0 ml  Net     76 ml    Exam: Gen:  NAD, sleeping. Cardiovascular:  HSIR, No M/R/G Respiratory:  Lungs diminished Gastrointestinal:  Abdomen soft, NT/ND, + BS Extremities:  Trace edema   Data Reviewed:    Labs:  Basic Metabolic Panel:  Recent Labs Lab 04/03/14 2105 04/04/14 0130 04/05/14 0418  NA 138 137 140  K 5.1 4.6 4.4  CL 101 98 101  CO2 25 25 24   GLUCOSE 95 111* 187*  BUN 24* 23 33*  CREATININE 1.47* 1.44* 1.66*  CALCIUM 9.2 8.9 8.8   GFR Estimated Creatinine Clearance: 39 ml/min (by C-G formula based on Cr of 1.66). Liver Function Tests:  Recent Labs Lab  04/04/14 0130  AST 26  ALT 24  ALKPHOS 97  BILITOT 1.1  PROT 6.9  ALBUMIN 3.4*   Coagulation profile  Recent Labs Lab 04/04/14 0130  INR 1.15    CBC:  Recent Labs Lab 04/03/14 2105 04/04/14 0130 04/05/14 0418  WBC 13.3* 13.1* 14.2*  HGB 14.2 13.9 13.7  HCT 42.7 41.3 40.7  MCV 96.4 96.7 95.1  PLT 258 224 248   Cardiac Enzymes:  Recent Labs Lab 04/03/14 2105 04/04/14 0130 04/04/14 0651 04/04/14 1304  TROPONINI 0.40* 0.42* <0.30 <0.30   BNP (last 3 results)  Recent Labs  04/03/14 2105  PROBNP 3436.0*   Thyroid function studies:  Recent Labs  04/04/14 0135  TSH 1.930   Microbiology Recent Results (from the past 240 hour(s))  MRSA PCR SCREENING     Status: None   Collection Time    04/04/14 12:59 AM      Result Value Ref Range Status   MRSA by PCR NEGATIVE  NEGATIVE Final   Comment:            The GeneXpert MRSA Assay (FDA     approved for NASAL specimens     only), is one component of a     comprehensive MRSA colonization     surveillance program. It is not     intended to diagnose MRSA     infection nor to guide or     monitor treatment for     MRSA infections.     Radiographs/Studies:   Dg Chest 2 View  04/03/2014   CLINICAL DATA:  Short of breath.  Cough.  EXAM: CHEST  2 VIEW  COMPARISON:  03/16/2014  FINDINGS: Cardiac silhouette is borderline enlarged. Normal mediastinal and hilar contours. Lungs are mildly hyperexpanded. There is mild scarring at the apices. No lung consolidation or edema. No pleural effusion or pneumothorax. Bony thorax is demineralized but intact.  IMPRESSION: No acute cardiopulmonary disease.   Electronically Signed   By: Lajean Manes M.D.   On: 04/03/2014 21:07    Medications:   . aspirin EC  81 mg Oral Daily  . atorvastatin  20 mg Oral q1800  . clonazePAM  0.5 mg Oral Daily  . clonazePAM  0.5 mg Oral QHS  . diltiazem  60 mg Oral 4 times per day  . ipratropium  0.5 mg Nebulization TID  . ketotifen  2 drop  Both Eyes BID  . levalbuterol  0.63 mg Nebulization TID  . methylPREDNISolone (SOLU-MEDROL) injection  60 mg Intravenous Q12H  . nicotine  14 mg Transdermal Daily  . pantoprazole  40 mg Oral BID  . polyvinyl alcohol  1 drop Both Eyes BID  . sodium chloride  3 mL Intravenous Q12H  . sodium chloride  3 mL Intravenous Q12H  . tamsulosin  0.4 mg Oral Daily   Continuous Infusions:    Time spent: 35 minutes with > 50% of time discussing current diagnostic test results, clinical impression and plan of care.    LOS: 2 days   Javontae Marlette P  [facility] Pager [phone]. If unable to reach me by pager, please call my cell phone at [phone].  *Please refer to [email or web address], password [email or web address] to get updated schedule on who will round on this patient, as hospitalists switch teams weekly. If 7PM-7AM, please contact night-coverage at [email or web address], password [email or web address] for any overnight needs.  04/05/2014, 8:29 AM    **Disclaimer: This note was dictated with voice recognition software. Similar sounding words can inadvertently be transcribed and this note may contain transcription errors which may not have been corrected upon publication of note.**   Information printed out and reviewed with the patient/family:     In an effort to keep you and your family informed about your hospital stay, I am providing you with this information sheet. If you or your family have any questions, please do not hesitate to have the nursing staff page me to set up a meeting time.  Also note that the hospitalist doctors typically change on Tuesdays or Wednesdays to a different hospitalist doctor.  KETRICK MATNEY 04/05/2014 2 (Number of days in the hospital)  Treatment team:  Dr. Jacquelynn Cree, Hospitalist (Internist)  Dr. Jules Husbands, Cardiologist  Dr. Lucio Edward, Gastroenterologist  Pertinent labs/studies:  Your echocardiogram showed that your heart muscle is a little weak.    Your blood tests show that  you have reduced kidney function (chronic) but no other significant abnormalities.    Your heart enzyme tests were initially elevated (from your fast heart rate) but they are now normal.  Your colonoscopy showed small internal hemorrhoids and a polyp which was removed.  Your upper endoscopy showed a small hiatal hernia and findings associated with reflux.  Plan for today: Dr. Fuller Plan thinks that you can safely start blood thinners in 3 days.  Anticipated discharge date: Possibly tomorrow.

## 2014-04-06 DIAGNOSIS — J441 Chronic obstructive pulmonary disease with (acute) exacerbation: Secondary | ICD-10-CM

## 2014-04-06 DIAGNOSIS — K449 Diaphragmatic hernia without obstruction or gangrene: Secondary | ICD-10-CM | POA: Diagnosis present

## 2014-04-06 DIAGNOSIS — R799 Abnormal finding of blood chemistry, unspecified: Secondary | ICD-10-CM

## 2014-04-06 DIAGNOSIS — N183 Chronic kidney disease, stage 3 unspecified: Secondary | ICD-10-CM

## 2014-04-06 MED ORDER — DIGOXIN 125 MCG PO TABS
0.1250 mg | ORAL_TABLET | Freq: Every day | ORAL | Status: DC
  Administered 2014-04-06 – 2014-04-09 (×4): 0.125 mg via ORAL
  Filled 2014-04-06 (×4): qty 1

## 2014-04-06 MED ORDER — DILTIAZEM HCL ER COATED BEADS 240 MG PO CP24
240.0000 mg | ORAL_CAPSULE | Freq: Every day | ORAL | Status: DC
  Administered 2014-04-06 – 2014-04-07 (×2): 240 mg via ORAL
  Filled 2014-04-06 (×3): qty 1

## 2014-04-06 MED ORDER — WARFARIN - PHARMACIST DOSING INPATIENT: Freq: Every day | Status: DC

## 2014-04-06 MED ORDER — PREDNISONE 20 MG PO TABS
40.0000 mg | ORAL_TABLET | Freq: Every day | ORAL | Status: DC
  Administered 2014-04-07: 40 mg via ORAL
  Filled 2014-04-06 (×2): qty 2

## 2014-04-06 MED ORDER — PATIENT'S GUIDE TO USING COUMADIN BOOK
Freq: Once | Status: AC
  Administered 2014-04-06: 10:00:00
  Filled 2014-04-06: qty 1

## 2014-04-06 MED ORDER — GUAIFENESIN ER 600 MG PO TB12
600.0000 mg | ORAL_TABLET | Freq: Two times a day (BID) | ORAL | Status: DC
  Administered 2014-04-06 – 2014-04-09 (×6): 600 mg via ORAL
  Filled 2014-04-06 (×7): qty 1

## 2014-04-06 MED ORDER — WARFARIN SODIUM 5 MG PO TABS
5.0000 mg | ORAL_TABLET | Freq: Once | ORAL | Status: AC
  Administered 2014-04-06: 5 mg via ORAL
  Filled 2014-04-06: qty 1

## 2014-04-06 MED ORDER — WARFARIN VIDEO
Freq: Once | Status: AC
  Administered 2014-04-06: 10:00:00

## 2014-04-06 NOTE — Progress Notes (Signed)
Progress Note   Don [name] UUV:[id number] DOB: May 13, 1941 DOA: 04/03/2014 PCP: Anthoney Harada, MD   Brief Narrative:   Don [name] is an 73 y.o. male with PMH of atrial fibrillation on digoxin/diltiazem/anticoagulated with xarelto, COPD with ongoing tobacco use and hypertension who presented to [facility] 04/03/14 with cough, wheezing, chest discomfort and nausea. The patient had stopped his Xarelto 1-2 months ago secondary to dark stools. Upon initial evaluation in the ER, the patient was found to be in atrial fibrillation with rapid ventricular response, likely triggered by a COPD exacerbation. His initial troponins were mildly elevated. He subsequently was transferred to [facility] for cardiology and GI consultation. At this point, the patient has had upper and lower endoscopies and has been cleared to start therapeutic anticoagulation. Coumadin ordered.   Assessment/Plan:   Principal Problem:   Atrial fibrillation with RVR / elevated troponin / demand ischemia  Patient was placed on a Cardizem drip and his digoxin was held.  Rate now controlled on Cardizem PO. Low dose digoxin resumed today per cardiology.  TSH WNL at 1.930. Pro BNP elevated at 3436.  2-D echo done 04/04/14: EF 45%, with inferior/septal hypokinesis.  Troponin elevation likely from demand ischemia.  Active Problems: ? Dysphagia  Choked on food yesterday, RN needed to perform Heimlich maneuver.   SLP consulted requested.   ?Barrett's esophagus/small hiatal hernia  Continue long-term PPI therapy.  Followup esophageal biopsies to rule out Barrett's esophagus.  Consider repeat EGD in 3 years.  Colonic polyp  Status post polypectomy. Followup pathology. Repeat colonoscopy in 5 years.  Hyperlipidemia  Zocor changed to an equivalent dose of Lipitor due to the potential drug interaction with Cardizem. Will need to be discharged on Lipitor if he goes home on Cardizem.  COPD (chronic obstructive  pulmonary disease) with acute bronchitis  Chest x-ray negative for infiltrates, so empiric Levaquin discontinued.  Discontinue Solu-Medrol, start prednisone 40 mg daily starting tomorrow.  Continue Xopenex.  Hypertension  Blood pressure currently controlled on Cardizem.  Tobacco abuse  Tobacco cessation counseling provided.  Continue nicotine patch.  Heme positive stool  Status post EGD and colonoscopy. No active source of bleeding found.  History of prostate cancer  Not on active treatment. S/P prostatectomy/radiation therapy.  Stage III CKD (chronic kidney disease)  Patient's GFR ranges from 52-60, consistent with stage III chronic kidney disease.  Baseline creatinine 1.4-1.6. Current creatinine consistent with usual baseline values.   Code Status: Full. Family Communication: No family at the bedside. Disposition Plan: Home when stable.   IV Access:    Peripheral IV   Procedures:    2 D Echo 04/04/14: EF 45%, with inferior/septal hypokinesis.   Medical Consultants:    Dr. Jules Husbands, Cardiology.  Dr. Lucio Edward, Gastroenterology.   Other Consultants:    None.   Anti-Infectives:    Levaquin 04/03/14---> 04/03/14  Subjective:    Don [name] choked on his food yesterday, says "I get strangled sometimes".  Nurses report he eats fast, and that he seems confused/disoriented at times.  Still complains of shortness of breath and an occasional wheeze.    Objective:    Filed Vitals:   04/06/14 0559 04/06/14 0846 04/06/14 1448 04/06/14 1505  BP: 104/64   130/77  Pulse: 101   99  Temp: 97.8 F (36.6 C)   97.3 F (36.3 C)  TempSrc: Oral   Oral  Resp: 20   19  Height:      Weight: 76.7 kg (  169 lb 1.5 oz)     SpO2: 95% 95% 95% 96%    Intake/Output Summary (Last 24 hours) at 04/06/14 1531 Last data filed at 04/06/14 1506  Gross per 24 hour  Intake    480 ml  Output      0 ml  Net    480 ml    Exam: Gen:  NAD,  sleeping. Cardiovascular:  HSIR, No M/R/G Respiratory:  Lungs coarse Gastrointestinal:  Abdomen soft, NT/ND, + BS Extremities:  Trace edema   Data Reviewed:    Labs: Basic Metabolic Panel:  Recent Labs Lab 04/03/14 2105 04/04/14 0130 04/05/14 0418  NA 138 137 140  K 5.1 4.6 4.4  CL 101 98 101  CO2 25 25 24   GLUCOSE 95 111* 187*  BUN 24* 23 33*  CREATININE 1.47* 1.44* 1.66*  CALCIUM 9.2 8.9 8.8   GFR Estimated Creatinine Clearance: 39 ml/min (by C-G formula based on Cr of 1.66). Liver Function Tests:  Recent Labs Lab 04/04/14 0130  AST 26  ALT 24  ALKPHOS 97  BILITOT 1.1  PROT 6.9  ALBUMIN 3.4*   Coagulation profile  Recent Labs Lab 04/04/14 0130  INR 1.15    CBC:  Recent Labs Lab 04/03/14 2105 04/04/14 0130 04/05/14 0418  WBC 13.3* 13.1* 14.2*  HGB 14.2 13.9 13.7  HCT 42.7 41.3 40.7  MCV 96.4 96.7 95.1  PLT 258 224 248   Cardiac Enzymes:  Recent Labs Lab 04/03/14 2105 04/04/14 0130 04/04/14 0651 04/04/14 1304  TROPONINI 0.40* 0.42* <0.30 <0.30   BNP (last 3 results)  Recent Labs  04/03/14 2105  PROBNP 3436.0*   Thyroid function studies:  Recent Labs  04/04/14 0135  TSH 1.930   Microbiology Recent Results (from the past 240 hour(s))  MRSA PCR SCREENING     Status: None   Collection Time    04/04/14 12:59 AM      Result Value Ref Range Status   MRSA by PCR NEGATIVE  NEGATIVE Final   Comment:            The GeneXpert MRSA Assay (FDA     approved for NASAL specimens     only), is one component of a     comprehensive MRSA colonization     surveillance program. It is not     intended to diagnose MRSA     infection nor to guide or     monitor treatment for     MRSA infections.     Radiographs/Studies:   Dg Chest 2 View  04/03/2014   CLINICAL DATA:  Short of breath.  Cough.  EXAM: CHEST  2 VIEW  COMPARISON:  03/16/2014  FINDINGS: Cardiac silhouette is borderline enlarged. Normal mediastinal and hilar contours. Lungs  are mildly hyperexpanded. There is mild scarring at the apices. No lung consolidation or edema. No pleural effusion or pneumothorax. Bony thorax is demineralized but intact.  IMPRESSION: No acute cardiopulmonary disease.   Electronically Signed   By: Lajean Manes M.D.   On: 04/03/2014 21:07    Medications:   . aspirin EC  81 mg Oral Daily  . atorvastatin  20 mg Oral q1800  . clonazePAM  0.5 mg Oral Daily  . clonazePAM  0.5 mg Oral QHS  . digoxin  0.125 mg Oral Daily  . diltiazem  240 mg Oral Daily  . ipratropium  0.5 mg Nebulization TID  . ketotifen  2 drop Both Eyes BID  . levalbuterol  0.63 mg Nebulization TID  .  methylPREDNISolone (SOLU-MEDROL) injection  60 mg Intravenous Q12H  . nicotine  14 mg Transdermal Daily  . pantoprazole  40 mg Oral BID  . polyvinyl alcohol  1 drop Both Eyes BID  . sodium chloride  3 mL Intravenous Q12H  . sodium chloride  3 mL Intravenous Q12H  . tamsulosin  0.4 mg Oral Daily  . warfarin  5 mg Oral ONCE-1800  . Warfarin - Pharmacist Dosing Inpatient   Does not apply q1800   Continuous Infusions:    Time spent: 35 minutes with > 50% of time discussing current diagnostic test results, clinical impression and plan of care.    LOS: 3 days   [place]  [facility] Pager [phone]. If unable to reach me by pager, please call my cell phone at [phone].  *Please refer to [email or web address], password [email or web address] to get updated schedule on who will round on this patient, as hospitalists switch teams weekly. If 7PM-7AM, please contact night-coverage at [email or web address], password [email or web address] for any overnight needs.  04/06/2014, 3:31 PM    **Disclaimer: This note was dictated with voice recognition software. Similar sounding words can inadvertently be transcribed and this note may contain transcription errors which may not have been corrected upon publication of note.**   Information printed out and reviewed with the patient/family:     In an effort to keep you  and your family informed about your hospital stay, I am providing you with this information sheet. If you or your family have any questions, please do not hesitate to have the nursing staff page me to set up a meeting time.  Also note that the hospitalist doctors typically change on Tuesdays or Wednesdays to a different hospitalist doctor.  Don [name] 04/06/2014 3 (Number of days in the hospital)  Treatment team:  Dr. Jacquelynn Cree, Hospitalist (Internist)  Dr. Jules Husbands, Cardiologist  Dr. Lucio Edward, Gastroenterologist  Pertinent labs/studies:  Your echocardiogram showed that your heart muscle is a little weak.    Your blood tests show that you have reduced kidney function (chronic) but no other significant abnormalities.    Your heart enzyme tests were initially elevated (from your fast heart rate) but they are now normal.  Your colonoscopy showed small internal hemorrhoids and a polyp which was removed.  Your upper endoscopy showed a small hiatal hernia and findings associated with reflux.  Plan for today: You have been started on a blood thinner called Coumadin, which needs to be monitored with blood tests to determine when the levels are therapeutic. We will get the physical therapist to evaluate you for discharge planning.  Anticipated discharge date: Possibly tomorrow.

## 2014-04-06 NOTE — Progress Notes (Signed)
    Progress Note   Subjective   No GI complaints   Objective  Vital signs in last 24 hours: Temp:  [97.4 F (36.3 C)-97.8 F (36.6 C)] 97.8 F (36.6 C) (05/25 0559) Pulse Rate:  [101-115] 101 (05/25 0559) Resp:  [18-33] 20 (05/25 0559) BP: (104-166)/(62-140) 104/64 mmHg (05/25 0559) SpO2:  [91 %-98 %] 95 % (05/25 0846) Weight:  [169 lb 1.5 oz (76.7 kg)] 169 lb 1.5 oz (76.7 kg) (05/25 0559) Last BM Date: 04/06/14 General:   Alert, well-developed, white male in NAD Heart:  Regular rate and rhythm; no murmurs Abdomen:  Soft, nontender and nondistended. Normal bowel sounds, without guarding, and without rebound.   Extremities:  Without edema. Neurologic:  Alert and  oriented x4;  grossly normal neurologically. Psych:  Alert and cooperative. Normal mood and affect. Anxious.  Intake/Output from previous day: 05/24 0701 - 05/25 0700 In: 240 [P.O.:240] Out: -  Intake/Output this shift:    Lab Results:  Recent Labs  04/03/14 2105 04/04/14 0130 04/05/14 0418  WBC 13.3* 13.1* 14.2*  HGB 14.2 13.9 13.7  HCT 42.7 41.3 40.7  PLT 258 224 248   BMET  Recent Labs  04/03/14 2105 04/04/14 0130 04/05/14 0418  NA 138 137 140  K 5.1 4.6 4.4  CL 101 98 101  CO2 25 25 24   GLUCOSE 95 111* 187*  BUN 24* 23 33*  CREATININE 1.47* 1.44* 1.66*  CALCIUM 9.2 8.9 8.8   LFT  Recent Labs  04/04/14 0130  PROT 6.9  ALBUMIN 3.4*  AST 26  ALT 24  ALKPHOS 97  BILITOT 1.1   PT/INR  Recent Labs  04/04/14 0130  LABPROT 14.5  INR 1.15      Assessment & Plan   1. Heme positive stool and history of hematochezia secondary to internal hemorrhoids. OTC Prep H supp daily as needed for hemorrhoidal symptoms. OK to begin Coumadin as outlined by Dr. Tamala Julian.  2. Possible Barretts.  Follow up on pathology as outpatient. PPI daily. OP follow up with Dr. Carlean Purl in 4-6 weeks.   3. Colon polyp. Follow up on pathology as outpatient.  OK for discharge from GI standpoint. GI signing  off.    Principal Problem:   Atrial fibrillation with RVR Active Problems:   COPD (chronic obstructive pulmonary disease)   Hypertension   Tobacco abuse   Demand ischemia   Heme positive stool   Acute bronchitis   History of prostate cancer   Stage III chronic kidney disease   Elevated troponin   Other and unspecified hyperlipidemia   Nonspecific abnormal finding in stool contents   Personal history of colonic polyps   Benign neoplasm of colon    LOS: 3 days   Ladene Artist MD  04/06/2014, 9:42 AM

## 2014-04-06 NOTE — Progress Notes (Addendum)
       Patient Name: Don [name] Date of Encounter: 04/06/2014    SUBJECTIVE:No abdominal pain or dyspnea. Refuses Xarelto.  TELEMETRY:  A fib with moderate rate control. Filed Vitals:   04/05/14 1439 04/05/14 1948 04/05/14 2102 04/06/14 0559  BP: 125/81  120/62 104/64  Pulse: 104  107 101  Temp: 97.5 F (36.4 C)  97.5 F (36.4 C) 97.8 F (36.6 C)  TempSrc: Oral  Oral Oral  Resp: 20  20 20   Height:      Weight:    169 lb 1.5 oz (76.7 kg)  SpO2: 94% 92% 96% 95%    Intake/Output Summary (Last 24 hours) at 04/06/14 0823 Last data filed at 04/05/14 1439  Gross per 24 hour  Intake    240 ml  Output      0 ml  Net    240 ml   LABS: Basic Metabolic Panel:  Recent Labs  04/04/14 0130 04/05/14 0418  NA 137 140  K 4.6 4.4  CL 98 101  CO2 25 24  GLUCOSE 111* 187*  BUN 23 33*  CREATININE 1.44* 1.66*  CALCIUM 8.9 8.8   CBC:  Recent Labs  04/04/14 0130 04/05/14 0418  WBC 13.1* 14.2*  HGB 13.9 13.7  HCT 41.3 40.7  MCV 96.7 95.1  PLT 224 248   Cardiac Enzymes:  Recent Labs  04/04/14 0130 04/04/14 0651 04/04/14 1304  TROPONINI 0.42* <0.30 <0.30   BNP    Component Value Date/Time   PROBNP 3436.0* 04/03/2014 2105    Radiology/Studies:  Colonoscopy: revealed polyp Upper endo: HH and ?Barrett's  Physical Exam: Blood pressure 104/64, pulse 101, temperature 97.8 F (36.6 C), temperature source Oral, resp. rate 20, height 5\' 9"  (1.753 m), weight 169 lb 1.5 oz (76.7 kg), SpO2 95.00%. Weight change: 2 lb 3.3 oz (1 kg)  Wt Readings from Last 3 Encounters:  04/06/14 169 lb 1.5 oz (76.7 kg)  04/06/14 169 lb 1.5 oz (76.7 kg)  04/06/14 169 lb 1.5 oz (76.7 kg)   Cough Chest with rhonchi IIRR relatively rapid.   ASSESSMENT:  1. A. fibrillation with borderline/poor rate control 2. GI eval completed per Dr. Fuller Plan with possible Barrett's and Colon polyp identified 3. COPD 4. Acute diastolic heart failure %. Elevated troponin but no clinical evidence of  MI  Plan:  1. After speaking with patient, he is not in favor of using novel oral anticoagulants. 2. I recommend coumadin per pharmacy as recommended by GI, Dr. Fuller Plan. 3. Continue rate controlling agents and management of AF per his Cardiologist, ? [facility] vs PHarrington Challenger. It appears that reliable follow-up is may be an issue. 4. Rate control could be better. Will convert to PO diltiazem and restart low dose digoxin  Signed, Belva Crome III 04/06/2014, 8:23 AM

## 2014-04-06 NOTE — Progress Notes (Signed)
ANTICOAGULATION CONSULT NOTE - Initial Consult  Pharmacy Consult for Warfarin Indication: atrial fibrillation  Allergies  Allergen Reactions  . Wellbutrin [Bupropion] Anxiety    Patient Measurements: Height: 5\' 9"  (175.3 cm) Weight: 169 lb 1.5 oz (76.7 kg) IBW/kg (Calculated) : 70.7  Vital Signs: Temp: 97.8 F (36.6 C) (05/25 0559) Temp src: Oral (05/25 0559) BP: 104/64 mmHg (05/25 0559) Pulse Rate: 101 (05/25 0559)  Labs:  Recent Labs  04/03/14 2105 04/04/14 0130 04/04/14 0651 04/04/14 1304 04/05/14 0418  HGB 14.2 13.9  --   --  13.7  HCT 42.7 41.3  --   --  40.7  PLT 258 224  --   --  248  APTT  --  38*  --   --   --   LABPROT  --  14.5  --   --   --   INR  --  1.15  --   --   --   CREATININE 1.47* 1.44*  --   --  1.66*  TROPONINI 0.40* 0.42* <0.30 <0.30  --     Estimated Creatinine Clearance: 39 ml/min (by C-G formula based on Cr of 1.66).   Medical History: Past Medical History  Diagnosis Date  . Hyperlipidemia   . Hypertension   . COPD (chronic obstructive pulmonary disease)   . Prostate cancer   . Tubular adenoma of colon 07/31/02, 11/18/03  . Atrial fibrillation   . Noncompliance with medications 04/2013    xarelto, digoxin  . Tobacco abuse     ongoing  . Stage III chronic kidney disease 04/03/2014    Medications:  Scheduled:  . aspirin EC  81 mg Oral Daily  . atorvastatin  20 mg Oral q1800  . clonazePAM  0.5 mg Oral Daily  . clonazePAM  0.5 mg Oral QHS  . digoxin  0.125 mg Oral Daily  . diltiazem  240 mg Oral Daily  . ipratropium  0.5 mg Nebulization TID  . ketotifen  2 drop Both Eyes BID  . levalbuterol  0.63 mg Nebulization TID  . methylPREDNISolone (SOLU-MEDROL) injection  60 mg Intravenous Q12H  . nicotine  14 mg Transdermal Daily  . pantoprazole  40 mg Oral BID  . polyvinyl alcohol  1 drop Both Eyes BID  . sodium chloride  3 mL Intravenous Q12H  . sodium chloride  3 mL Intravenous Q12H  . tamsulosin  0.4 mg Oral Daily   Infusions:    PRN: sodium chloride, acetaminophen, acetaminophen, levalbuterol, morphine injection, ondansetron (ZOFRAN) IV, ondansetron, oxyCODONE, sodium chloride  Assessment: y.o. male with PMH of atrial fibrillation on digoxin/diltiazem/anticoagulated with xarelto. The patient had stopped his Xarelto 1-2 months ago secondary to dark stools. Upon initial evaluation in the ER, the patient was found to be in atrial fibrillation with rapid ventricular response, likely triggered by a COPD exacerbation. His initial troponins were mildly elevated. He subsequently was transferred to [facility] for cardiology and GI consultation - possible Barrett's and Colon polyp identified. Patient decline new oral anticoagulants, therefore, beginning warfarin 5/25. Per GI, ok to start today.  Baseline INR 1.15 on 5/23  CBC has been wnl since admission  No significant drug interactions present; aspirin 81mg  daily noted  Cardiac diet ordered   Goal of Therapy:  INR 2-3   Plan:   Begin warfarin 5mg  PO x 1 today  Daily PT/INR  Provide warfarin education prior to discharge  Peggyann Juba, PharmD, BCPS Pager: [phone] 04/06/2014,9:06 AM

## 2014-04-07 ENCOUNTER — Inpatient Hospital Stay (HOSPITAL_COMMUNITY): Payer: Medicare HMO

## 2014-04-07 ENCOUNTER — Telehealth: Payer: Self-pay | Admitting: *Deleted

## 2014-04-07 ENCOUNTER — Encounter: Payer: Self-pay | Admitting: Gastroenterology

## 2014-04-07 ENCOUNTER — Encounter: Payer: Self-pay | Admitting: *Deleted

## 2014-04-07 ENCOUNTER — Encounter (HOSPITAL_COMMUNITY): Payer: Self-pay | Admitting: Gastroenterology

## 2014-04-07 LAB — BASIC METABOLIC PANEL
BUN: 37 mg/dL — AB (ref 6–23)
CALCIUM: 8.2 mg/dL — AB (ref 8.4–10.5)
CO2: 25 meq/L (ref 19–32)
Chloride: 101 mEq/L (ref 96–112)
Creatinine, Ser: 1.55 mg/dL — ABNORMAL HIGH (ref 0.50–1.35)
GFR calc Af Amer: 49 mL/min — ABNORMAL LOW (ref >90)
GFR calc non Af Amer: 42 mL/min — ABNORMAL LOW (ref >90)
GLUCOSE: 200 mg/dL — AB (ref 70–99)
Potassium: 4 mEq/L (ref 3.7–5.3)
SODIUM: 139 meq/L (ref 137–147)

## 2014-04-07 LAB — CBC
HCT: 39.7 % (ref 39.0–52.0)
HEMOGLOBIN: 13.3 g/dL (ref 13.0–17.0)
MCH: 32 pg (ref 26.0–34.0)
MCHC: 33.5 g/dL (ref 30.0–36.0)
MCV: 95.4 fL (ref 78.0–100.0)
Platelets: 217 10*3/uL (ref 150–400)
RBC: 4.16 MIL/uL — AB (ref 4.22–5.81)
RDW: 13.9 % (ref 11.5–15.5)
WBC: 10.8 10*3/uL — ABNORMAL HIGH (ref 4.0–10.5)

## 2014-04-07 LAB — PROTIME-INR
INR: 1.06 (ref 0.00–1.49)
PROTHROMBIN TIME: 13.6 s (ref 11.6–15.2)

## 2014-04-07 LAB — OCCULT BLOOD, POC DEVICE: Fecal Occult Bld: POSITIVE — AB

## 2014-04-07 MED ORDER — POLYETHYLENE GLYCOL 3350 17 G PO PACK
17.0000 g | PACK | Freq: Every day | ORAL | Status: DC
  Administered 2014-04-07 – 2014-04-08 (×2): 17 g via ORAL
  Filled 2014-04-07 (×2): qty 1

## 2014-04-07 MED ORDER — PREDNISONE 20 MG PO TABS
30.0000 mg | ORAL_TABLET | Freq: Every day | ORAL | Status: DC
  Administered 2014-04-08 – 2014-04-09 (×2): 30 mg via ORAL
  Filled 2014-04-07 (×3): qty 1

## 2014-04-07 MED ORDER — SENNOSIDES-DOCUSATE SODIUM 8.6-50 MG PO TABS
1.0000 | ORAL_TABLET | Freq: Two times a day (BID) | ORAL | Status: DC
  Administered 2014-04-07 – 2014-04-09 (×4): 1 via ORAL
  Filled 2014-04-07 (×6): qty 1

## 2014-04-07 MED ORDER — TIOTROPIUM BROMIDE MONOHYDRATE 18 MCG IN CAPS
18.0000 ug | ORAL_CAPSULE | Freq: Every day | RESPIRATORY_TRACT | Status: DC
  Administered 2014-04-07 – 2014-04-09 (×2): 18 ug via RESPIRATORY_TRACT
  Filled 2014-04-07: qty 5

## 2014-04-07 MED ORDER — WARFARIN SODIUM 5 MG PO TABS
5.0000 mg | ORAL_TABLET | Freq: Once | ORAL | Status: AC
  Administered 2014-04-07: 5 mg via ORAL
  Filled 2014-04-07: qty 1

## 2014-04-07 NOTE — Evaluation (Signed)
Occupational Therapy Evaluation Patient Details Name: Don [name] MRN: [id number] DOB: [birth year] Today's Date: 04/07/2014    History of Present Illness 73 yo male admitted with Afib with RVR. hx of HTN, COPD, prostate cancer, Afib, noncompliance. Pt lives alone (wife is in SNF)   Clinical Impression   Pt overall at supervision level. He lives home alone right now as his wife is in a SNF for the last 4 months per his report. He is able to articulate his PLOF and is oriented. He carries out his ADL in the room today with only one verbal cue to remember to wash his hands after toileting. Otherwise, supervision only (min guard assist with tub transfer) and feel he will do ok with these tasks at home.    Follow Up Recommendations  No OT follow up    Equipment Recommendations  None recommended by OT    Recommendations for Other Services       Precautions / Restrictions Precautions Precautions: Fall Restrictions Weight Bearing Restrictions: No      Mobility Bed Mobility               General bed mobility comments: up in chair  Transfers Overall transfer level: Modified independent                    Balance Overall balance assessment: Needs assistance         Standing balance support: No upper extremity supported;During functional activity Standing balance-Leahy Scale: Fair                              ADL Overall ADL's : Needs assistance/impaired Eating/Feeding:  (not tested)   Grooming: Wash/dry hands;Supervision/safety;Standing (min verbal cues to wash hands after toileting)   Upper Body Bathing: Sitting;Set up   Lower Body Bathing: Supervison/ safety;Sit to/from stand   Upper Body Dressing : Set up;Sitting   Lower Body Dressing: Supervision/safety;Sit to/from stand   Toilet Transfer: Supervision/safety;Ambulation;Comfort height toilet   Toileting- Clothing Manipulation and Hygiene: Supervision/safety;Sit to/from stand   Tub/  Shower Transfer: Tub transfer;Min guard   Functional mobility during ADLs: Supervision/safety General ADL Comments: Pt able state location and sequence of evens leading up to admission as well as his PLOF, etc without difficulty. No cognitive deficits noted. One verbal cue to wash hands after toileting and pt stated, "oh yeah, I should do that." Overall pt doing well.      Vision                     Perception     Praxis      Pertinent Vitals/Pain HR max with activity 128 down to 98 with rest.     Hand Dominance     Extremity/Trunk Assessment Upper Extremity Assessment Upper Extremity Assessment: Overall WFL for tasks assessed   Lower Extremity Assessment Lower Extremity Assessment: Overall WFL for tasks assessed   Cervical / Trunk Assessment Cervical / Trunk Assessment: Normal   Communication Communication Communication: No difficulties   Cognition Arousal/Alertness: Awake/alert Behavior During Therapy: WFL for tasks assessed/performed Overall Cognitive Status: Within Functional Limits for tasks assessed                     General Comments       Exercises       Shoulder Instructions      Home Living Family/patient expects to be discharged to:: Private residence Living Arrangements:  Spouse/significant other (wife in a SNF for last 4 months) Available Help at Discharge: Available PRN/intermittently (states his son checks in intermittantly) Type of Home: Mobile home Home Access: Ramped entrance     Home Layout: One level     Bathroom Shower/Tub: Teacher, early years/pre: Standard     Home Equipment: Cane - quad;Cane - single point;Wheelchair - [employer] - 2 wheels   Additional Comments: all DME is his wife's who had a stroke      Prior Functioning/Environment Level of Independence: Independent             OT Diagnosis: Generalized weakness   OT Problem List: Decreased strength   OT Treatment/Interventions:  Self-care/ADL training;Patient/family education;Therapeutic activities;DME and/or AE instruction    OT Goals(Current goals can be found in the care plan section) Acute Rehab OT Goals Patient Stated Goal: retun to PLOF OT Goal Formulation: With patient Time For Goal Achievement: 04/21/14 Potential to Achieve Goals: Good  OT Frequency: Min 2X/week   Barriers to D/C:            Co-evaluation              End of Session    Activity Tolerance: Patient tolerated treatment well Patient left: in chair (with PT)   Time: 8185-6314 OT Time Calculation (min): 18 min Charges:  OT General Charges $OT Visit: 1 Procedure OT Evaluation $Initial OT Evaluation Tier I: 1 Procedure OT Treatments $Therapeutic Activity: 8-22 mins G-Codes:    Alycia Patten Tyrie Porzio [phone] 04/07/2014, 11:01 AM

## 2014-04-07 NOTE — Evaluation (Signed)
Physical Therapy Evaluation Patient Details Name: PASQUALE MATTERS MRN: [id number] DOB: 02/09/1941 Today's Date: 04/07/2014   History of Present Illness  73 yo male admitted with Afib with RVR. hx of HTN, COPD, prostate cancer, Afib, noncompliance. Pt lives alone (wife is in SNF)  Clinical Impression  On eval, pt required Min guard assist for mobility-able to ambulate ~250 feet without assistive device. Tolerated well. Noted 1 instance of LOB while ambulating. HR high-133 bpm, low-107 bpm while ambulating. Recommend home safety evaluation at discharge. Will follow during stay.     Follow Up Recommendations Home health PT (home safety evaluation)    Equipment Recommendations  None recommended by PT    Recommendations for Other Services OT consult     Precautions / Restrictions Precautions Precautions: Fall Restrictions Weight Bearing Restrictions: No      Mobility  Bed Mobility                  Transfers Overall transfer level: Modified independent                  Ambulation/Gait Ambulation/Gait assistance: Min guard Ambulation Distance (Feet): 250 Feet Assistive device: None Gait Pattern/deviations: Step-through pattern     General Gait Details: LOB x 1 while ambulating in hallway (pt actually leaned into wall/stumbled). close guard for safety. HR 133 bpm at hightest, 107 bpm at lowest while ambulating.   Stairs            Wheelchair Mobility    Modified Rankin (Stroke Patients Only)       Balance Overall balance assessment: Needs assistance         Standing balance support: No upper extremity supported;During functional activity Standing balance-Leahy Scale: Fair                               Pertinent Vitals/Pain Lower back pain- 5/10     Home Living Family/patient expects to be discharged to:: Private residence Living Arrangements: Spouse/significant other (wife in a SNF for last 4 months) Available Help at  Discharge: Available PRN/intermittently (states his son checks in intermittantly) Type of Home: Mobile home Home Access: Ramped entrance     Home Layout: One level Home Equipment: Cane - quad;Cane - single point;Wheelchair - [employer] - 2 wheels Additional Comments: all DME is his wife's who had a stroke    Prior Function Level of Independence: Independent               Hand Dominance        Extremity/Trunk Assessment   Upper Extremity Assessment: Defer to OT evaluation           Lower Extremity Assessment: Overall WFL for tasks assessed      Cervical / Trunk Assessment: Normal  Communication   Communication: No difficulties  Cognition Arousal/Alertness: Awake/alert Behavior During Therapy: WFL for tasks assessed/performed Overall Cognitive Status: Within Functional Limits for tasks assessed                      General Comments      Exercises        Assessment/Plan    PT Assessment Patient needs continued PT services  PT Diagnosis Difficulty walking   PT Problem List Decreased balance;Decreased mobility;Decreased activity tolerance  PT Treatment Interventions Gait training;Functional mobility training;Therapeutic activities;Therapeutic exercise;Patient/family education;Balance training   PT Goals (Current goals can be found in the Care Plan section) Acute Rehab  PT Goals Patient Stated Goal: retun to PLOF PT Goal Formulation: With patient Time For Goal Achievement: 04/14/14 Potential to Achieve Goals: Good    Frequency Min 3X/week   Barriers to discharge        Co-evaluation               End of Session   Activity Tolerance: Patient tolerated treatment well Patient left: in chair;with call bell/phone within reach           Time: 0920-0929 PT Time Calculation (min): 9 min   Charges:   PT Evaluation $Initial PT Evaluation Tier I: 1 Procedure PT Treatments $Gait Training: 8-22 mins   PT G Codes:           Weston Anna, MPT Pager: [phone]

## 2014-04-07 NOTE — Progress Notes (Signed)
Patient ID: Don [name], male   DOB: Apr 26, 1941, 73 y.o.   MRN: [id number] [facility] PROGRESS NOTE  CONN TROMBETTA ALP:[id number] DOB: 1941-07-16 DOA: 04/03/2014 PCP: Anthoney Harada, MD  Brief Narrative:   73 y.o. male with PMH of atrial fibrillation on digoxin/diltiazem/anticoagulated with xarelto, COPD with ongoing tobacco use and hypertension who presented to [facility] 04/03/14 with cough, wheezing, chest discomfort and nausea. The patient had stopped his Xarelto 1-2 months ago secondary to dark stools. Upon initial evaluation in the ER, the patient was found to be in atrial fibrillation with rapid ventricular response, likely triggered by a COPD exacerbation. His initial troponins were mildly elevated. He subsequently was transferred to [facility] for cardiology and GI consultation. At this point, the patient has had upper and lower endoscopies and has been cleared to start therapeutic anticoagulation. Coumadin ordered.   Assessment/Plan:   Principal Problem:  Atrial fibrillation with RVR / elevated troponin / demand ischemia  Patient was placed on a Cardizem drip and his digoxin was held. Rate now controlled on Cardizem PO. Low dose digoxin resumed per cardiology.  TSH WNL at 1.930. Pro BNP elevated at 3436.  2-D echo done 04/04/14: EF 45%, with inferior/septal hypokinesis.  Troponin elevation likely from demand ischemia. Appreciate cardiology input  Active Problems:  ? Dysphagia  Choked on food, RN needed to perform Heimlich maneuver.  SLP consulted requested and recommended MBS, order placed  ?Barrett's esophagus/small hiatal hernia  Continue long-term PPI therapy.  Followup esophageal biopsies to rule out Barrett's esophagus.  Consider repeat EGD in 3 years. Colonic polyp  Status post polypectomy. Followup pathology. Repeat colonoscopy in 5 years. Hyperlipidemia  Zocor changed to an equivalent dose of Lipitor due to the potential drug interaction with Cardizem. Will need to  be discharged on Lipitor if he goes home on Cardizem. Low back pain  Lumbar spine xray ordered  COPD (chronic obstructive pulmonary disease) with acute bronchitis  Chest x-ray negative for infiltrates, so empiric Levaquin discontinued.  Discontinued Solu-Medrol, continue Prednisone tapering  Continue Xopenex. Hypertension  Blood pressure currently controlled on Cardizem. Tobacco abuse  Tobacco cessation counseling provided.  Continue nicotine patch. Heme positive stool  Status post EGD and colonoscopy. No active source of bleeding found. History of prostate cancer  Not on active treatment. S/P prostatectomy/radiation therapy. Stage III CKD (chronic kidney disease)  Patient's GFR ranges from 52-60, consistent with stage III chronic kidney disease.  Baseline creatinine 1.4-1.6. Current creatinine consistent with usual baseline values.  Code Status: Full.  Family Communication: No family at the bedside.  Disposition Plan: Remains inpatient    IV Access:   Peripheral IV Procedures:   2 D Echo 04/04/14: EF 45%, with inferior/septal hypokinesis. Medical Consultants:   Dr. Jules Husbands, Cardiology.  Dr. Lucio Edward, Gastroenterology. Other Consultants:   None. Anti-Infectives:   Levaquin 04/03/14---> 04/03/14  HPI/Subjective: No events overnight.   Objective: Filed Vitals:   04/07/14 0925 04/07/14 1004 04/07/14 1356 04/07/14 2024  BP:  138/77 152/68 145/86  Pulse: 133 104 103 96  Temp:   97.3 F (36.3 C) 97.9 F (36.6 C)  TempSrc:   Oral Oral  Resp:   21 19  Height:      Weight:      SpO2:   97% 97%    Intake/Output Summary (Last 24 hours) at 04/07/14 2148 Last data filed at 04/07/14 1803  Gross per 24 hour  Intake    720 ml  Output      0  ml  Net    720 ml    Exam:   General:  Pt is alert, follows commands appropriately, not in acute distress  Cardiovascular: Regular rate and rhythm, S1/S2, no murmurs, no rubs, no gallops  Respiratory: Clear to  auscultation bilaterally, no wheezing, no crackles, no rhonchi  Abdomen: Soft, non tender, non distended, bowel sounds present, no guarding   Data Reviewed: Basic Metabolic Panel:  Recent Labs Lab 04/03/14 2105 04/04/14 0130 04/05/14 0418 04/07/14 0434  NA 138 137 140 139  K 5.1 4.6 4.4 4.0  CL 101 98 101 101  CO2 25 25 24 25   GLUCOSE 95 111* 187* 200*  BUN 24* 23 33* 37*  CREATININE 1.47* 1.44* 1.66* 1.55*  CALCIUM 9.2 8.9 8.8 8.2*   Liver Function Tests:  Recent Labs Lab 04/04/14 0130  AST 26  ALT 24  ALKPHOS 97  BILITOT 1.1  PROT 6.9  ALBUMIN 3.4*   CBC:  Recent Labs Lab 04/03/14 2105 04/04/14 0130 04/05/14 0418 04/07/14 0434  WBC 13.3* 13.1* 14.2* 10.8*  HGB 14.2 13.9 13.7 13.3  HCT 42.7 41.3 40.7 39.7  MCV 96.4 96.7 95.1 95.4  PLT 258 224 248 217   Cardiac Enzymes:  Recent Labs Lab 04/03/14 2105 04/04/14 0130 04/04/14 0651 04/04/14 1304  TROPONINI 0.40* 0.42* <0.30 <0.30   CBG:  Recent Labs Lab 04/04/14 1713  GLUCAP 195*    Recent Results (from the past 240 hour(s))  MRSA PCR SCREENING     Status: None   Collection Time    04/04/14 12:59 AM      Result Value Ref Range Status   MRSA by PCR NEGATIVE  NEGATIVE Final   Comment:            The GeneXpert MRSA Assay (FDA     approved for NASAL specimens     only), is one component of a     comprehensive MRSA colonization     surveillance program. It is not     intended to diagnose MRSA     infection nor to guide or     monitor treatment for     MRSA infections.     Scheduled Meds: . aspirin EC  81 mg Oral Daily  . atorvastatin  20 mg Oral q1800  . clonazePAM  0.5 mg Oral Daily  . clonazePAM  0.5 mg Oral QHS  . digoxin  0.125 mg Oral Daily  . diltiazem  240 mg Oral Daily  . guaiFENesin  600 mg Oral BID  . ketotifen  2 drop Both Eyes BID  . levalbuterol  0.63 mg Nebulization TID  . nicotine  14 mg Transdermal Daily  . pantoprazole  40 mg Oral BID  . polyethylene glycol  17 g  Oral Daily  . polyvinyl alcohol  1 drop Both Eyes BID  . predniSONE  40 mg Oral Q breakfast  . senna-docusate  1 tablet Oral BID  . sodium chloride  3 mL Intravenous Q12H  . sodium chloride  3 mL Intravenous Q12H  . tamsulosin  0.4 mg Oral Daily  . tiotropium  18 mcg Inhalation Daily  . Warfarin - Pharmacist Dosing Inpatient   Does not apply q1800   Continuous Infusions:    Theodis Blaze, MD  [facility] Pager [phone]  If 7PM-7AM, please contact night-coverage [email or web address] Password [email or web address] 04/07/2014, 9:48 PM   LOS: 4 days

## 2014-04-07 NOTE — Procedures (Signed)
Objective Swallowing Evaluation: Modified Barium Swallowing Study  Patient Details  Name: Don [name] MRN: [id number] Date of Birth: October 27, 1941  Today's Date: 04/07/2014 Time: 1340-1420 SLP Time Calculation (min): 40 min  Past Medical History:  Past Medical History  Diagnosis Date  . Hyperlipidemia   . Hypertension   . COPD (chronic obstructive pulmonary disease)   . Prostate cancer   . Tubular adenoma of colon 07/31/02, 11/18/03  . Atrial fibrillation   . Noncompliance with medications 04/2013    xarelto, digoxin  . Tobacco abuse     ongoing  . Stage III chronic kidney disease 04/03/2014   Past Surgical History:  Past Surgical History  Procedure Laterality Date  . 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 Anal abcess,hemorroids,    . Esophagogastroduodenoscopy  11/18/2003    Dr. Earle Gell  . Colonoscopy w/ biopsies  11/18/2003    Dr. Earle Gell  . Tonsillectomy    . Colonoscopy N/A 04/05/2014    Procedure: COLONOSCOPY;  Surgeon: Ladene Artist, MD;  Location: [facility] ENDOSCOPY;  Service: Endoscopy;  Laterality: N/A;  . Esophagogastroduodenoscopy N/A 04/05/2014    Procedure: ESOPHAGOGASTRODUODENOSCOPY (EGD);  Surgeon: Ladene Artist, MD;  Location: [facility];  Service: Endoscopy;  Laterality: N/A;   HPI:  Don [name] is an 72 y.o. male with PMH of atrial fibrillation on digoxin/diltiazem/anticoagulated with xarelto, COPD with ongoing tobacco use and hypertension who presented to [facility] 04/03/14 with cough, wheezing, chest discomfort and nausea. The patient had stopped his Xarelto 1-2 months ago secondary to dark stools. Upon initial evaluation in the ER, the patient was found to be in atrial fibrillation with rapid ventricular response, likely triggered by a COPD exacerbation. His initial troponins were mildly elevated. He subsequently was transferred to [facility] for cardiology and GI consultation.  Increased cough, dx'd with bronchitis.  recent choking episode with a  potato, requiring Heimlech by RN.  GI following.  Endoscopy revealed Hiatal hernia and question of Barrett's esophagus.     Assessment / Plan / Recommendation Clinical Impression  Dysphagia Diagnosis: Mild pharyngeal phase dysphagia;Mild oral phase dysphagia Clinical impression: Mild oral prep difficulty due to being edentulous; mild pharyngeal phase dysphagia with decreased laryngeal elevation, resulting in penetration of thin liquids to the level of the cords, during the swallow.  This was prevented with use of chin tuck, but patient required moderate verbal cues to use chin tuck effectively.    Treatment Recommendation  F/U MBS in ___ days (Comment) (1)    Diet Recommendation Dysphagia 3 (Mechanical Soft);Thin liquid   Liquid Administration via: Cup;No straw Medication Administration: Whole meds with puree Supervision: Full supervision/cueing for compensatory strategies;Patient able to self feed Compensations: Slow rate;Small sips/bites;Follow solids with liquid Postural Changes and/or Swallow Maneuvers: Out of bed for meals;Seated upright 90 degrees;Upright 30-60 min after meal;Chin tuck    Other  Recommendations Recommended Consults: MBS Oral Care Recommendations: Oral care BID;Patient independent with oral care Other Recommendations: Clarify dietary restrictions   Follow Up Recommendations  None    Frequency and Duration min 2x/week  2 weeks   Pertinent Vitals/Pain Choked on potato, requiring the Heimlech by RN.  LS: Inspiratory Wheeze; CXR:  NAD, afebrile.    SLP Swallow Goals     General HPI: Don [name] is an 73 y.o. male with PMH of atrial fibrillation on digoxin/diltiazem/anticoagulated with xarelto, COPD with ongoing tobacco use and hypertension who presented to [facility] 04/03/14 with cough, wheezing, chest discomfort and nausea. The patient had stopped  his Xarelto 1-2 months ago secondary to dark stools. Upon initial evaluation in the ER, the patient was found to  be in atrial fibrillation with rapid ventricular response, likely triggered by a COPD exacerbation. His initial troponins were mildly elevated. He subsequently was transferred to [facility] for cardiology and GI consultation.  Increased cough, dx'd with bronchitis.  recent choking episode with a potato, requiring Heimlech by RN.  GI following.  Endoscopy revealed Hiatal hernia and question of Barrett's esophagus. Type of Study: Modified Barium Swallowing Study Reason for Referral: Objectively evaluate swallowing function Previous Swallow Assessment: BSE this am; coughed after liquids Diet Prior to this Study: Dysphagia 3 (soft);Nectar-thick liquids Temperature Spikes Noted: No Respiratory Status: Room air History of Recent Intubation: No Behavior/Cognition: Alert;Cooperative;Pleasant mood Oral Cavity - Dentition: Edentulous Oral Motor / Sensory Function: Within functional limits Self-Feeding Abilities: Able to feed self Patient Positioning: Upright in chair Baseline Vocal Quality: Clear Volitional Cough: Strong Volitional Swallow: Able to elicit Anatomy: Within functional limits Pharyngeal Secretions: Not observed secondary MBS    Reason for Referral Objectively evaluate swallowing function   Oral Phase Oral Preparation/Oral Phase Oral Phase: WFL   Pharyngeal Phase Pharyngeal Phase Pharyngeal Phase: Impaired Pharyngeal - Thin Pharyngeal - Thin Cup: Reduced epiglottic inversion;Reduced anterior laryngeal mobility;Reduced laryngeal elevation;Penetration/Aspiration during swallow;Compensatory strategies attempted (Comment) (Chin tuck prevented penetration) Penetration/Aspiration details (thin cup): Material enters airway, CONTACTS cords and not ejected out  Cervical Esophageal Phase    GO    Cervical Esophageal Phase Cervical Esophageal Phase: Don [name]         Don [name] 04/07/2014, 2:56 PM

## 2014-04-07 NOTE — Progress Notes (Signed)
ANTICOAGULATION CONSULT NOTE - Follow Up Consult  Pharmacy Consult for Warfarin Indication: atrial fibrillation  Allergies  Allergen Reactions  . Wellbutrin [Bupropion] Anxiety    Patient Measurements: Height: 5\' 9"  (175.3 cm) Weight: 170 lb (77.111 kg) IBW/kg (Calculated) : 70.7  Vital Signs: Temp: 97.8 F (36.6 C) (05/26 0509) Temp src: Oral (05/26 0509) BP: 131/86 mmHg (05/26 0509) Pulse Rate: 109 (05/26 0509)  Labs:  Recent Labs  04/04/14 1304 04/05/14 0418 04/07/14 0434  HGB  --  13.7 13.3  HCT  --  40.7 39.7  PLT  --  248 217  LABPROT  --   --  13.6  INR  --   --  1.06  CREATININE  --  1.66* 1.55*  TROPONINI <0.30  --   --     Estimated Creatinine Clearance: 41.8 ml/min (by C-G formula based on Cr of 1.55).    Assessment: 73 y.o. male with PMH of atrial fibrillation on digoxin/diltiazem/anticoagulated with xarelto. The patient had stopped his Xarelto 1-2 months ago secondary to dark stools. Upon initial evaluation in the ER, the patient was found to be in atrial fibrillation with rapid ventricular response, likely triggered by a COPD exacerbation. His initial troponins were mildly elevated. He subsequently was transferred to [facility] for cardiology and GI consultation - possible Barrett's and Colon polyp identified. Patient decline new oral anticoagulants, therefore, beginning warfarin 5/25. Per GI, ok to start today.  INR 1.06   CBC has been wnl since admission  No significant drug interactions present; aspirin 81mg  daily noted  Cardiac diet ordered, patient eating 100% of meals   Goal of Therapy:  INR 2-3   Plan:   Warfarin 5mg  PO x 1 today  Daily PT/INR  Provide warfarin education prior to discharge  Hershal Coria, PharmD, BCPS Pager: [phone] 04/07/2014 7:09 AM

## 2014-04-07 NOTE — Telephone Encounter (Signed)
Per Nicoletta Ba, PA patient needs OV with Dr. Carlean Purl in 1 month for post hospital f/u. Letter mailed to patient with appointment.

## 2014-04-07 NOTE — Progress Notes (Signed)
SUBJECTIVE:  No palpitations. No further GI bleeding. Has not had a bowel movement as of yet.  OBJECTIVE:   Vitals:   Filed Vitals:   04/06/14 2109 04/06/14 2129 04/07/14 0406 04/07/14 0509  BP:  139/76  131/86  Pulse:  99  109  Temp:  97.9 F (36.6 C)  97.8 F (36.6 C)  TempSrc:  Oral  Oral  Resp:  20  20  Height:      Weight:   170 lb (77.111 kg)   SpO2: 99% 95%  95%   I&O's:   Intake/Output Summary (Last 24 hours) at 04/07/14 0748 Last data filed at 04/06/14 1914  Gross per 24 hour  Intake    720 ml  Output      0 ml  Net    720 ml   TELEMETRY: Reviewed telemetry pt in atrial fibrillation, borderline rate control:     PHYSICAL EXAM General: Well developed, well nourished, in no acute distress Head:   Normal cephalic and atramatic  Lungs:  No wheezing Heart:  Irregularly irregular, S1 S2  No JVD.   Abdomen: abdomen soft and non-tender Msk:  Back normal,  Normal strength and tone for age. Extremities:  Trace lower extremity edema.   Neuro: Alert and oriented. Psych:  Normal affect, responds appropriately   LABS: Basic Metabolic Panel:  Recent Labs  04/05/14 0418 04/07/14 0434  NA 140 139  K 4.4 4.0  CL 101 101  CO2 24 25  GLUCOSE 187* 200*  BUN 33* 37*  CREATININE 1.66* 1.55*  CALCIUM 8.8 8.2*   Liver Function Tests: No results found for this basename: AST, ALT, ALKPHOS, BILITOT, PROT, ALBUMIN,  in the last 72 hours No results found for this basename: LIPASE, AMYLASE,  in the last 72 hours CBC:  Recent Labs  04/05/14 0418 04/07/14 0434  WBC 14.2* 10.8*  HGB 13.7 13.3  HCT 40.7 39.7  MCV 95.1 95.4  PLT 248 217   Cardiac Enzymes:  Recent Labs  04/04/14 1304  TROPONINI <0.30   BNP: No components found with this basename: POCBNP,  D-Dimer: No results found for this basename: DDIMER,  in the last 72 hours Hemoglobin A1C: No results found for this basename: HGBA1C,  in the last 72 hours Fasting Lipid Panel: No results found for  this basename: CHOL, HDL, LDLCALC, TRIG, CHOLHDL, LDLDIRECT,  in the last 72 hours Thyroid Function Tests: No results found for this basename: TSH, T4TOTAL, FREET3, T3FREE, THYROIDAB,  in the last 72 hours Anemia Panel: No results found for this basename: VITAMINB12, FOLATE, FERRITIN, TIBC, IRON, RETICCTPCT,  in the last 72 hours Coag Panel:   Lab Results  Component Value Date   INR 1.06 04/07/2014   INR 1.15 04/04/2014   INR 1.43 04/28/2013    RADIOLOGY: Dg Chest 2 View  04/03/2014   CLINICAL DATA:  Short of breath.  Cough.  EXAM: CHEST  2 VIEW  COMPARISON:  03/16/2014  FINDINGS: Cardiac silhouette is borderline enlarged. Normal mediastinal and hilar contours. Lungs are mildly hyperexpanded. There is mild scarring at the apices. No lung consolidation or edema. No pleural effusion or pneumothorax. Bony thorax is demineralized but intact.  IMPRESSION: No acute cardiopulmonary disease.   Electronically Signed   By: Lajean Manes M.D.   On: 04/03/2014 21:07   Dg Chest 2 View  03/17/2014   CLINICAL DATA:  Cough and congestion  EXAM: CHEST  2 VIEW  COMPARISON:  DG CHEST 1V PORT dated 04/25/2013  FINDINGS: Normal cardiac silhouette. Lungs are hyperinflated. No effusion, infiltrate, pneumothorax. There is a nodular 1 cm density projecting over the left lower lobe which is not seen on prior.  IMPRESSION: 1. Hyperinflated lungs without acute findings. 2. Indeterminate nodular density at the left lung base. Recommend CT thorax without contrast for further evaluation.   Electronically Signed   By: Suzy Bouchard M.D.   On: 03/17/2014 08:28   Ct Chest W Contrast  03/20/2014   CLINICAL DATA:  Cough.  Pulmonary nodule.  EXAM: CT CHEST WITH CONTRAST  TECHNIQUE: Multidetector CT imaging of the chest was performed during intravenous contrast administration.  CONTRAST:  13mL OMNIPAQUE IOHEXOL 300 MG/ML  SOLN  COMPARISON:  Chest x-ray dated 03/16/2014  FINDINGS: There is no pulmonary nodule at the left lung base. There  is some slight scarring at the 0 left base laterally. The density seen on chest x-ray was cup probably a confluence of osseous and vascular structures and this minimal interstitial accentuation.  The patient has bilateral peribronchial thickening with slight bibasilar bronchiectasis, right more than left. There are small emphysematous blebs bilaterally. There is a tiny area of scarring at the right lung apex. Heart size and pulmonary vascularity are normal. No adenopathy. No significant osseous abnormality. The visualized portion of the upper abdomen demonstrates bilateral adrenal hyperplasia but is otherwise normal.  IMPRESSION: 1. No pulmonary nodule at the left lung base or elsewhere in the lungs. 2. Emphysema with bibasilar bronchiectasis and chronic bronchitic changes.   Electronically Signed   By: Rozetta Nunnery M.D.   On: 03/20/2014 16:28      ASSESSMENT: [name]:    Atrial fibrillation: Will increase Cardizem CD to 300 mg daily for better rate control. Continue digoxin. Rate control is still suboptimal. Coumadin for stroke prevention. This will need to be followed carefully as an outpatient, especially given his GI bleeding issues.  Chronic renal insufficiency: Creatinine improving slightly.  His primary care doctor is in [place] at [place].  Dr. Percival Spanish sees patients in [place]. We will arrange for cardiology followup there.  Jettie Booze, MD  04/07/2014  7:48 AM

## 2014-04-08 ENCOUNTER — Inpatient Hospital Stay (HOSPITAL_COMMUNITY): Payer: Medicare HMO

## 2014-04-08 LAB — CBC
HEMATOCRIT: 38.7 % — AB (ref 39.0–52.0)
HEMOGLOBIN: 13.2 g/dL (ref 13.0–17.0)
MCH: 32.2 pg (ref 26.0–34.0)
MCHC: 34.1 g/dL (ref 30.0–36.0)
MCV: 94.4 fL (ref 78.0–100.0)
Platelets: 241 10*3/uL (ref 150–400)
RBC: 4.1 MIL/uL — AB (ref 4.22–5.81)
RDW: 13.5 % (ref 11.5–15.5)
WBC: 8.4 10*3/uL (ref 4.0–10.5)

## 2014-04-08 LAB — BASIC METABOLIC PANEL
BUN: 39 mg/dL — ABNORMAL HIGH (ref 6–23)
CO2: 27 meq/L (ref 19–32)
Calcium: 8.3 mg/dL — ABNORMAL LOW (ref 8.4–10.5)
Chloride: 102 mEq/L (ref 96–112)
Creatinine, Ser: 1.65 mg/dL — ABNORMAL HIGH (ref 0.50–1.35)
GFR calc Af Amer: 46 mL/min — ABNORMAL LOW (ref >90)
GFR calc non Af Amer: 39 mL/min — ABNORMAL LOW (ref >90)
GLUCOSE: 192 mg/dL — AB (ref 70–99)
POTASSIUM: 4 meq/L (ref 3.7–5.3)
Sodium: 139 mEq/L (ref 137–147)

## 2014-04-08 LAB — PROTIME-INR
INR: 1.21 (ref 0.00–1.49)
PROTHROMBIN TIME: 15 s (ref 11.6–15.2)

## 2014-04-08 MED ORDER — WARFARIN SODIUM 5 MG PO TABS
5.0000 mg | ORAL_TABLET | Freq: Once | ORAL | Status: AC
  Administered 2014-04-08: 5 mg via ORAL
  Filled 2014-04-08: qty 1

## 2014-04-08 MED ORDER — POLYETHYLENE GLYCOL 3350 17 G PO PACK
17.0000 g | PACK | Freq: Two times a day (BID) | ORAL | Status: DC
  Administered 2014-04-08 – 2014-04-09 (×2): 17 g via ORAL
  Filled 2014-04-08 (×3): qty 1

## 2014-04-08 MED ORDER — DILTIAZEM HCL ER COATED BEADS 300 MG PO CP24
300.0000 mg | ORAL_CAPSULE | Freq: Every day | ORAL | Status: DC
  Filled 2014-04-08: qty 1

## 2014-04-08 MED ORDER — DILTIAZEM HCL ER COATED BEADS 300 MG PO CP24
300.0000 mg | ORAL_CAPSULE | Freq: Every day | ORAL | Status: DC
  Administered 2014-04-08 – 2014-04-09 (×2): 300 mg via ORAL
  Filled 2014-04-08: qty 1

## 2014-04-08 NOTE — Progress Notes (Addendum)
SUBJECTIVE:  No palpitations. No further GI bleeding. Has not had a bowel movement as of yet.  OBJECTIVE:   Vitals:   Filed Vitals:   04/07/14 2024 04/08/14 0302 04/08/14 0500 04/08/14 0559  BP: 145/86 119/57  138/82  Pulse: 96 96  101  Temp: 97.9 F (36.6 C)   97.8 F (36.6 C)  TempSrc: Oral   Oral  Resp: 19   19  Height:      Weight:   171 lb 8.3 oz (77.8 kg)   SpO2: 97%   95%   I&O's:    Intake/Output Summary (Last 24 hours) at 04/08/14 9147 Last data filed at 04/08/14 0736  Gross per 24 hour  Intake   1080 ml  Output      0 ml  Net   1080 ml   TELEMETRY: Reviewed telemetry pt in atrial fibrillation, borderline rate control:     PHYSICAL EXAM General: Well developed, well nourished, in no acute distress Head:   Normal cephalic and atramatic  Lungs:  Bilateral wheezing Heart:  Irregularly irregular, S1 S2  No JVD.   Abdomen: abdomen soft and non-tender Msk:  Back normal,  Normal strength and tone for age. Extremities:  Trace lower extremity edema.   Neuro: Alert and oriented. Psych:  Normal affect, responds appropriately   LABS: Basic Metabolic Panel:  Recent Labs  04/07/14 0434 04/08/14 0333  NA 139 139  K 4.0 4.0  CL 101 102  CO2 25 27  GLUCOSE 200* 192*  BUN 37* 39*  CREATININE 1.55* 1.65*  CALCIUM 8.2* 8.3*   Liver Function Tests: No results found for this basename: AST, ALT, ALKPHOS, BILITOT, PROT, ALBUMIN,  in the last 72 hours No results found for this basename: LIPASE, AMYLASE,  in the last 72 hours CBC:  Recent Labs  04/07/14 0434 04/08/14 0333  WBC 10.8* 8.4  HGB 13.3 13.2  HCT 39.7 38.7*  MCV 95.4 94.4  PLT 217 241   Cardiac Enzymes: No results found for this basename: CKTOTAL, CKMB, CKMBINDEX, TROPONINI,  in the last 72 hours BNP: No components found with this basename: POCBNP,  D-Dimer: No results found for this basename: DDIMER,  in the last 72 hours Hemoglobin A1C: No results found for this basename: HGBA1C,  in the  last 72 hours Fasting Lipid Panel: No results found for this basename: CHOL, HDL, LDLCALC, TRIG, CHOLHDL, LDLDIRECT,  in the last 72 hours Thyroid Function Tests: No results found for this basename: TSH, T4TOTAL, FREET3, T3FREE, THYROIDAB,  in the last 72 hours Anemia Panel: No results found for this basename: VITAMINB12, FOLATE, FERRITIN, TIBC, IRON, RETICCTPCT,  in the last 72 hours Coag Panel:   Lab Results  Component Value Date   INR 1.21 04/08/2014   INR 1.06 04/07/2014   INR 1.15 04/04/2014    RADIOLOGY: Dg Chest 2 View  04/03/2014   CLINICAL DATA:  Short of breath.  Cough.  EXAM: CHEST  2 VIEW  COMPARISON:  03/16/2014  FINDINGS: Cardiac silhouette is borderline enlarged. Normal mediastinal and hilar contours. Lungs are mildly hyperexpanded. There is mild scarring at the apices. No lung consolidation or edema. No pleural effusion or pneumothorax. Bony thorax is demineralized but intact.  IMPRESSION: No acute cardiopulmonary disease.   Electronically Signed   By: Lajean Manes M.D.   On: 04/03/2014 21:07   Dg Chest 2 View  03/17/2014   CLINICAL DATA:  Cough and congestion  EXAM: CHEST  2 VIEW  COMPARISON:  DG  CHEST 1V PORT dated 04/25/2013  FINDINGS: Normal cardiac silhouette. Lungs are hyperinflated. No effusion, infiltrate, pneumothorax. There is a nodular 1 cm density projecting over the left lower lobe which is not seen on prior.  IMPRESSION: 1. Hyperinflated lungs without acute findings. 2. Indeterminate nodular density at the left lung base. Recommend CT thorax without contrast for further evaluation.   Electronically Signed   By: Suzy Bouchard M.D.   On: 03/17/2014 08:28   Ct Chest W Contrast  03/20/2014   CLINICAL DATA:  Cough.  Pulmonary nodule.  EXAM: CT CHEST WITH CONTRAST  TECHNIQUE: Multidetector CT imaging of the chest was performed during intravenous contrast administration.  CONTRAST:  19mL OMNIPAQUE IOHEXOL 300 MG/ML  SOLN  COMPARISON:  Chest x-ray dated 03/16/2014  FINDINGS:  There is no pulmonary nodule at the left lung base. There is some slight scarring at the 0 left base laterally. The density seen on chest x-ray was cup probably a confluence of osseous and vascular structures and this minimal interstitial accentuation.  The patient has bilateral peribronchial thickening with slight bibasilar bronchiectasis, right more than left. There are small emphysematous blebs bilaterally. There is a tiny area of scarring at the right lung apex. Heart size and pulmonary vascularity are normal. No adenopathy. No significant osseous abnormality. The visualized portion of the upper abdomen demonstrates bilateral adrenal hyperplasia but is otherwise normal.  IMPRESSION: 1. No pulmonary nodule at the left lung base or elsewhere in the lungs. 2. Emphysema with bibasilar bronchiectasis and chronic bronchitic changes.   Electronically Signed   By: Rozetta Nunnery M.D.   On: 03/20/2014 16:28      ASSESSMENT: [name]:    Atrial fibrillation: Will increase Cardizem CD to 300 mg daily for better rate control. Continue digoxin. Rate control is still suboptimal. Coumadin for stroke prevention. This will need to be followed carefully as an outpatient, especially given his GI bleeding issues.  Further titration of Cardizem may be limited by blood pressure. If his rate control is still not optimized, would consider low-dose metoprolol, 25 mg by mouth twice a day.  Chronic renal insufficiency: Creatinine higher, slightly.  His primary care doctor is in [place] at [place].  Dr. Percival Spanish sees patients in [place]. We have arranged for cardiology followup there.  Wheezing more prominent on exam today.  Emphysema by CXR.  Would like to see that his HR is below 120 with slow walking today.  Resting HR should be around 80-90 range.  Jettie Booze, MD  04/08/2014  8:03 AM

## 2014-04-08 NOTE — Progress Notes (Signed)
Physical Therapy Treatment Patient Details Name: Don [name] MRN: [id number] DOB: 01-27-1941 Today's Date: 04/08/2014    History of Present Illness 73 yo male admitted with Afib with RVR. hx of HTN, COPD, prostate cancer, Afib, noncompliance. Pt lives alone (wife is in SNF).  5/27: RN reports xray lumbar spine unable to be completed today and okay to work with pt, pt also reports back pain better today    PT Comments    Pt reports likely d/c home tomorrow with anticipation of going to [place].  Pt ambulated in hallway and educated on HR parameters (see below).  Follow Up Recommendations  Home health PT (home safety eval)     Equipment Recommendations  None recommended by PT    Recommendations for Other Services       Precautions / Restrictions Precautions Precautions: Fall    Mobility  Bed Mobility Overal bed mobility: Modified Independent                Transfers Overall transfer level: Modified independent                  Ambulation/Gait Ambulation/Gait assistance: Supervision Ambulation Distance (Feet): 240 Feet Assistive device: None Gait Pattern/deviations: Step-through pattern Gait velocity: decreased to keep HR lower, pt also prefers shoes (unable to locate at this time)   General Gait Details: occasional use of handrail, pt educated on HR parameters and that per cardiologist note: ambulatory HR preferrably under 120, HR highest 127 briefly however mostly under 119 during ambulation   Stairs            Wheelchair Mobility    Modified Rankin (Stroke Patients Only)       Balance                                    Cognition Arousal/Alertness: Awake/alert Behavior During Therapy: WFL for tasks assessed/performed Overall Cognitive Status: Within Functional Limits for tasks assessed                      Exercises      General Comments        Pertinent Vitals/Pain HR at rest 112 bpm HR during gait  90-127 bpm (however mostly remained under 119)    Home Living                      Prior Function            PT Goals (current goals can now be found in the care plan section) Progress towards PT goals: Progressing toward goals    Frequency  Min 3X/week    PT Plan Current plan remains appropriate    Co-evaluation             End of Session Equipment Utilized During Treatment: Gait belt Activity Tolerance: Patient tolerated treatment well Patient left: with nursing/sitter in room (with nsg tech attempting to locate shoes)     Time: 7026-3785 PT Time Calculation (min): 13 min  Charges:  $Gait Training: 8-22 mins                    G Codes:      Junius Argyle 04/08/2014, 4:31 PM Carmelia Bake, PT, DPT 04/08/2014 Pager: [phone]

## 2014-04-08 NOTE — Progress Notes (Addendum)
ANTICOAGULATION CONSULT NOTE - Follow Up Consult  Pharmacy Consult for Warfarin Indication: atrial fibrillation  Allergies  Allergen Reactions  . Wellbutrin [Bupropion] Anxiety    Patient Measurements: Height: 5\' 9"  (175.3 cm) Weight: 171 lb 8.3 oz (77.8 kg) IBW/kg (Calculated) : 70.7  Vital Signs: Temp: 97.8 F (36.6 C) (05/27 0559) Temp src: Oral (05/27 0559) BP: 138/82 mmHg (05/27 0559) Pulse Rate: 101 (05/27 0559)  Labs:  Recent Labs  04/07/14 0434 04/08/14 0333  HGB 13.3 13.2  HCT 39.7 38.7*  PLT 217 241  LABPROT 13.6 15.0  INR 1.06 1.21  CREATININE 1.55* 1.65*    Estimated Creatinine Clearance: 39.3 ml/min (by C-G formula based on Cr of 1.65).    Assessment: 73 y.o. male with PMH of atrial fibrillation on digoxin/diltiazem/anticoagulated with xarelto. The patient had stopped his Xarelto 1-2 months ago secondary to dark stools. Upon initial evaluation in the ER, the patient was found to be in atrial fibrillation with rapid ventricular response, likely triggered by a COPD exacerbation. His initial troponins were mildly elevated. He subsequently was transferred to [facility] for cardiology and GI consultation - possible Barrett's and Colon polyp identified. Patient decline new oral anticoagulants, therefore, beginning warfarin 5/25. Per GI, ok to resume anticoagulation.  INR 1.21, appropriate increase  CBC has been wnl since admission  No significant drug interactions present; aspirin 81mg  daily discontinued now that on warfarin therapy given bleed history and no additional indications for aspirin use.  Cardiac diet ordered, patient eating 100% of meals   Goal of Therapy:  INR 2-3   Plan:   Warfarin 5mg  PO x 1 today  Daily PT/INR Completed warfarin education with patient.  Patient verbalized understanding of the safety and use of warfarin therapy (including drug/food interactions, s/sx's bleeding, INR monitoring).  Hershal Coria, PharmD, BCPS Pager:  [phone] 04/08/2014 7:28 AM

## 2014-04-08 NOTE — Progress Notes (Signed)
Noted to have a 6 beat run of Vtach. Pt  Asymptomatic and VS obtained, which were WNL. Tylene Fantasia, NP notified. No new orders received. Will continue to monitor.

## 2014-04-08 NOTE — Progress Notes (Addendum)
Patient ID: Don [name], male   DOB: May 03, 1941, 73 y.o.   MRN: [id number] [facility] PROGRESS NOTE  Don [name] VFI:[id number] DOB: 03/28/1941 DOA: 04/03/2014 PCP: Anthoney Harada, MD  Brief Narrative:   73 y.o. male with PMH of atrial fibrillation on digoxin/diltiazem/anticoagulated with xarelto, COPD with ongoing tobacco use and hypertension who presented to [facility] 04/03/14 with cough, wheezing, chest discomfort and nausea. The patient had stopped his Xarelto 1-2 months ago secondary to dark stools. Upon initial evaluation in the ER, the patient was found to be in atrial fibrillation with rapid ventricular response, likely triggered by a COPD exacerbation. His initial troponins were mildly elevated. He subsequently was transferred to [facility] for cardiology and GI consultation. At this point, the patient has had upper and lower endoscopies and has been cleared to start therapeutic anticoagulation. Coumadin ordered.   Assessment/Plan:   Principal Problem:  Atrial fibrillation with RVR / elevated troponin / demand ischemia  Patient was placed on a Cardizem drip and his digoxin was held. Low dose digoxin resumed per cardiology.  TSH WNL at 1.930. Pro BNP elevated at 3436.  2-D echo done 04/04/14: EF 45%, with inferior/septal hypokinesis.  Troponin elevation likely from demand ischemia. Appreciate cardiology input - 5/27, increasing Cardizem CD to 300 mg daily for better rate control. Rate control is still suboptimal. Coumadin initiated for stroke prevention. Further titration of Cardizem may be limited by blood pressure. As per cardiology, if his rate control is not optimized, would consider low dose metoprolol 25 mg by mouth twice a day.  Active Problems:  ? Dysphagia  Choked on food, RN needed to perform Heimlich maneuver.  SLP consulted requested and recommended MBS Speech therapy have recommended dysphagia 3 Diet and thin liquids-tolerating. ?Barrett's esophagus/small hiatal  hernia  Continue long-term PPI therapy.  Esophageal biopsy suggesting finding secondary to reflux. No malignancy. Consider repeat EGD in 3 years. Colonic polyp  Status post polypectomy. Pathology: Tubular adenoma. No high-grade dysplasia or malignancy. Repeat colonoscopy in 5 years. Hyperlipidemia  Zocor changed to an equivalent dose of Lipitor due to the potential drug interaction with Cardizem. Will need to be discharged on Lipitor if he goes home on Cardizem. Low back pain  Lumbar spine xray ordered -cannot perform on 5/27 secondary to barium COPD (chronic obstructive pulmonary disease) with acute bronchitis  Chest x-ray negative for infiltrates, so empiric Levaquin discontinued.  Discontinued Solu-Medrol, continue Prednisone tapering  Continue Xopenex. Hypertension  Blood pressure currently controlled on Cardizem. Tobacco abuse  Tobacco cessation counseling provided.  Continue nicotine patch. Heme positive stool  Status post EGD and colonoscopy. No active source of bleeding found. History of prostate cancer  Not on active treatment. S/P prostatectomy/radiation therapy. Stage III CKD (chronic kidney disease)  Patient's GFR ranges from 52-60, consistent with stage III chronic kidney disease.  Baseline creatinine 1.4-1.6. Current creatinine consistent with usual baseline values.  Code Status: Full.  Family Communication: discussed with Step son Mr. [name]. Disposition Plan: Remains inpatient. Possible DC 5/28    IV Access:   Peripheral IV Procedures:   2 D Echo 04/04/14: EF 45%, with inferior/septal hypokinesis. Medical Consultants:   Dr. Jules Husbands, Cardiology.  Dr. Lucio Edward, Gastroenterology. Other Consultants:   None. Anti-Infectives:   Levaquin 04/03/14---> 04/03/14  HPI/Subjective: Denies complaints. No BM since night of bowel prep. Denies chest pain or palpitations.  Objective: Filed Vitals:   04/08/14 0559 04/08/14 1135 04/08/14 1158 04/08/14 1428   BP: 138/82   153/98  Pulse:  101 108 105 109  Temp: 97.8 F (36.6 C)   97.8 F (36.6 C)  TempSrc: Oral   Oral  Resp: 19   20  Height:      Weight:      SpO2: 95%   97%    Intake/Output Summary (Last 24 hours) at 04/08/14 1455 Last data filed at 04/08/14 1405  Gross per 24 hour  Intake    840 ml  Output      0 ml  Net    840 ml    Exam:   General:  Pt is alert, follows commands appropriately, not in acute distress  Cardiovascular: S1 and S2 heard, irregularly irregular. No JVD, murmurs or pedal edema. Telemetry: A. fib with ventricular rate in the 100s-110s.  Respiratory: Clear to auscultation bilaterally, no wheezing, no crackles, no rhonchi  Abdomen: Soft, non tender, non distended, bowel sounds present, no guarding  CNS: Alert and oriented. No focal deficits.   Data Reviewed: Basic Metabolic Panel:  Recent Labs Lab 04/03/14 2105 04/04/14 0130 04/05/14 0418 04/07/14 0434 04/08/14 0333  NA 138 137 140 139 139  K 5.1 4.6 4.4 4.0 4.0  CL 101 98 101 101 102  CO2 25 25 24 25 27   GLUCOSE 95 111* 187* 200* 192*  BUN 24* 23 33* 37* 39*  CREATININE 1.47* 1.44* 1.66* 1.55* 1.65*  CALCIUM 9.2 8.9 8.8 8.2* 8.3*   Liver Function Tests:  Recent Labs Lab 04/04/14 0130  AST 26  ALT 24  ALKPHOS 97  BILITOT 1.1  PROT 6.9  ALBUMIN 3.4*   CBC:  Recent Labs Lab 04/03/14 2105 04/04/14 0130 04/05/14 0418 04/07/14 0434 04/08/14 0333  WBC 13.3* 13.1* 14.2* 10.8* 8.4  HGB 14.2 13.9 13.7 13.3 13.2  HCT 42.7 41.3 40.7 39.7 38.7*  MCV 96.4 96.7 95.1 95.4 94.4  PLT 258 224 248 217 241   Cardiac Enzymes:  Recent Labs Lab 04/03/14 2105 04/04/14 0130 04/04/14 0651 04/04/14 1304  TROPONINI 0.40* 0.42* <0.30 <0.30   CBG:  Recent Labs Lab 04/04/14 1713  GLUCAP 195*    Recent Results (from the past 240 hour(s))  MRSA PCR SCREENING     Status: None   Collection Time    04/04/14 12:59 AM      Result Value Ref Range Status   MRSA by PCR NEGATIVE   NEGATIVE Final   Comment:            The GeneXpert MRSA Assay (FDA     approved for NASAL specimens     only), is one component of a     comprehensive MRSA colonization     surveillance program. It is not     intended to diagnose MRSA     infection nor to guide or     monitor treatment for     MRSA infections.     Scheduled Meds: . atorvastatin  20 mg Oral q1800  . clonazePAM  0.5 mg Oral Daily  . clonazePAM  0.5 mg Oral QHS  . digoxin  0.125 mg Oral Daily  . diltiazem  300 mg Oral Daily  . guaiFENesin  600 mg Oral BID  . ketotifen  2 drop Both Eyes BID  . levalbuterol  0.63 mg Nebulization TID  . nicotine  14 mg Transdermal Daily  . pantoprazole  40 mg Oral BID  . polyethylene glycol  17 g Oral Daily  . polyvinyl alcohol  1 drop Both Eyes BID  . predniSONE  30 mg  Oral Q breakfast  . senna-docusate  1 tablet Oral BID  . sodium chloride  3 mL Intravenous Q12H  . sodium chloride  3 mL Intravenous Q12H  . tamsulosin  0.4 mg Oral Daily  . tiotropium  18 mcg Inhalation Daily  . warfarin  5 mg Oral ONCE-1800  . Warfarin - Pharmacist Dosing Inpatient   Does not apply q1800   Continuous Infusions:    Modena Jansky, MD, FACP, [facility]. [facility] Pager [phone]  If 7PM-7AM, please contact night-coverage [email or web address] Password [email or web address] 04/08/2014, 3:04 PM   LOS: 5 days

## 2014-04-08 NOTE — Progress Notes (Signed)
Occupational Therapy Treatment Patient Details Name: Don [name] MRN: [id number] DOB: 03/28/1941 Today's Date: 04/08/2014    History of present illness 73 yo male admitted with Afib with RVR. hx of HTN, COPD, prostate cancer, Afib, noncompliance. Pt lives alone (wife is in SNF)   OT comments  Pt up in room with OT and HR 105 at the highest. Overall mobilizing in room well for ADL. Pt tearful and emotional about some information given to him about the [place] but pt states this is good news. Nursing aware. He states he was just "caught off guard" but is happy. Reinforced safety strategies for ADL with pt and practiced clothing retrieval in the room.  Follow Up Recommendations  No OT follow up    Equipment Recommendations  None recommended by OT    Recommendations for Other Services      Precautions / Restrictions Precautions Precautions: Fall       Mobility Bed Mobility Overal bed mobility: Modified Independent                Transfers Overall transfer level: Modified independent Equipment used: None                  Balance                                   ADL                                   Tub/ Shower Transfer: Tub transfer;Supervision/safety     General ADL Comments: Practiced retrieving items from lower surfaces including drawers with no LOB and no assistive device. Pt up in room to the bathroom with OT for toileting and no LOB. HR 105 highest with activity. Pt with no complaint of back pain but does state stomach is sore when he coughs. Nursing present in room and aware of this discomfort. Per nursing, unable to to do back xray due to barium dye still in his system. Pt states his back has given him some difficulty in the past with lifting objects. Discussed safety with tub transfers and where to hold for support with stepping in and out of tub. Pt emotional when OT arrived today and tearful. When asked, pt states he was given  some information about his [employer] benefits/plan and he is happy but emotional too. he states  the info "took him by surprise" but yet he is happy. Nursing present during tearful episode.       Vision                     Perception     Praxis      Cognition   Behavior During Therapy:  (tearful but states he is happy about some information given to him) Overall Cognitive Status: Within Functional Limits for tasks assessed (pt seems a little distracted by information given to him today so slightly increased time to talk and perform tasks simultaneously. )                       Extremity/Trunk Assessment               Exercises     Shoulder Instructions       General Comments      Pertinent Vitals/ Pain  Some stomach discomfort with coughing. Nursing aware.  Home Living                                          Prior Functioning/Environment              Frequency Min 2X/week     Progress Toward Goals  OT Goals(current goals can now be found in the care plan section)  Progress towards OT goals: Progressing toward goals     Plan Discharge plan remains appropriate    Co-evaluation                 End of Session     Activity Tolerance Patient tolerated treatment well   Patient Left in chair;with call bell/phone within reach;with chair alarm set   Nurse Communication          Time: 1145-1200 OT Time Calculation (min): 15 min  Charges: OT General Charges $OT Visit: 1 Procedure OT Treatments $Therapeutic Activity: 8-22 mins  [place] [phone] 04/08/2014, 12:10 PM

## 2014-04-09 ENCOUNTER — Inpatient Hospital Stay (HOSPITAL_COMMUNITY): Payer: Medicare HMO

## 2014-04-09 DIAGNOSIS — I10 Essential (primary) hypertension: Secondary | ICD-10-CM

## 2014-04-09 LAB — PROTIME-INR
INR: 1.69 — AB (ref 0.00–1.49)
Prothrombin Time: 19.4 seconds — ABNORMAL HIGH (ref 11.6–15.2)

## 2014-04-09 MED ORDER — DILTIAZEM HCL ER COATED BEADS 300 MG PO CP24: 300.0000 mg | ORAL_CAPSULE | Freq: Every day | ORAL | Status: DC

## 2014-04-09 MED ORDER — SENNOSIDES-DOCUSATE SODIUM 8.6-50 MG PO TABS: 1.0000 | ORAL_TABLET | Freq: Two times a day (BID) | ORAL | Status: DC

## 2014-04-09 MED ORDER — PANTOPRAZOLE SODIUM 40 MG PO TBEC: 40.0000 mg | DELAYED_RELEASE_TABLET | Freq: Every day | ORAL | Status: DC

## 2014-04-09 MED ORDER — ATORVASTATIN CALCIUM 20 MG PO TABS: 20.0000 mg | ORAL_TABLET | Freq: Every day | ORAL | Status: DC

## 2014-04-09 MED ORDER — WARFARIN SODIUM 4 MG PO TABS: 4.0000 mg | ORAL_TABLET | Freq: Every day | ORAL | Status: DC

## 2014-04-09 MED ORDER — WARFARIN SODIUM 5 MG PO TABS
5.0000 mg | ORAL_TABLET | ORAL | Status: AC
  Administered 2014-04-09: 5 mg via ORAL
  Filled 2014-04-09: qty 1

## 2014-04-09 MED ORDER — PREDNISONE 10 MG PO TABS: ORAL_TABLET | ORAL | Status: DC

## 2014-04-09 MED ORDER — POLYETHYLENE GLYCOL 3350 17 G PO PACK: 17.0000 g | PACK | Freq: Two times a day (BID) | ORAL | Status: DC

## 2014-04-09 MED ORDER — METOPROLOL TARTRATE 25 MG PO TABS
25.0000 mg | ORAL_TABLET | Freq: Two times a day (BID) | ORAL | Status: DC
  Administered 2014-04-09: 25 mg via ORAL
  Filled 2014-04-09 (×2): qty 1

## 2014-04-09 MED ORDER — WARFARIN SODIUM 5 MG PO TABS
5.0000 mg | ORAL_TABLET | Freq: Once | ORAL | Status: DC
  Filled 2014-04-09: qty 1

## 2014-04-09 MED ORDER — NICOTINE 14 MG/24HR TD PT24: 14.0000 mg | MEDICATED_PATCH | Freq: Every day | TRANSDERMAL | Status: DC

## 2014-04-09 MED ORDER — DIGOXIN 250 MCG PO TABS: 0.1250 mg | ORAL_TABLET | Freq: Every day | ORAL | Status: DC

## 2014-04-09 MED ORDER — METOPROLOL TARTRATE 25 MG PO TABS: 25.0000 mg | ORAL_TABLET | Freq: Two times a day (BID) | ORAL | Status: DC

## 2014-04-09 NOTE — Discharge Summary (Addendum)
Physician Discharge Summary  Don [name] ATF:[id number] DOB: 05/29/1941 DOA: 04/03/2014  PCP: Lysbeth Penner, FNP  Admit date: 04/03/2014 Discharge date: 04/09/2014  Time spent: Less than 30 minutes  Recommendations for Outpatient Follow-up:  1. Stevan Born, FNP/PCP in 4 days with repeat labs (CBC, BMP, PT & INR). Adjust Coumadin dose as needed. 2. Dr. Einar Gip, Cardiology in 1 week. 3. Dr. Silvano Rusk, GI in 4 weeks. 4. Home Health RN & PT.  Discharge Diagnoses:  Principal Problem:   Atrial fibrillation with RVR Active Problems:   COPD (chronic obstructive pulmonary disease)   Hypertension   Tobacco abuse   Demand ischemia   Heme positive stool   Acute bronchitis   History of prostate cancer   Stage III chronic kidney disease   Elevated troponin   Other and unspecified hyperlipidemia   Nonspecific abnormal finding in stool contents   Personal history of colonic polyps   Benign neoplasm of colon   ? Barrett's esophagus   Hiatal hernia   Discharge Condition: Improved & Stable  Diet recommendation: Heart healthy diet  Filed Weights   04/07/14 0406 04/08/14 0500 04/09/14 0331  Weight: 77.111 kg (170 lb) 77.8 kg (171 lb 8.3 oz) 76.885 kg (169 lb 8 oz)    History of present illness:  73 y.o. male with PMH of atrial fibrillation on digoxin/diltiazem/anticoagulated with xarelto, COPD with ongoing tobacco use and hypertension who presented to [facility] 04/03/14 with cough, wheezing, chest discomfort and nausea. The patient had stopped his Xarelto 1-2 months ago secondary to dark stools. Upon initial evaluation in the ER, the patient was found to be in atrial fibrillation with rapid ventricular response, likely triggered by a COPD exacerbation. His initial troponins were mildly elevated. He subsequently was transferred to [facility] for cardiology and GI consultation. At this point, the patient has had upper and lower endoscopies and has been cleared to start therapeutic  anticoagulation. Coumadin ordered.   Hospital Course:   Principal Problem:  Atrial fibrillation with RVR / elevated troponin / demand ischemia  Patient was placed on a Cardizem drip and his digoxin was held. Low dose digoxin resumed per cardiology.  TSH WNL at 1.930. Pro BNP elevated at 3436.  2-D echo done 04/04/14: EF 45%, with inferior/septal hypokinesis.  Troponin elevation likely from demand ischemia.  Appreciate cardiology input - 5/27, increasing Cardizem CD to 300 mg daily for better rate control. Coumadin initiated for stroke prevention. Further titration of Cardizem may be limited by blood pressure. Patient's ventricular rate fluctuated between 90-100 but patient asymptomatic. As per cardiology recommendation, started metoprolol 25 mg by mouth twice a day. Discussed with cardiology who cleared patient for discharge. Active Problems:  ? Dysphagia  Choked on food, RN needed to perform Heimlich maneuver.  SLP consulted requested and recommended MBS  Speech therapy have recommended dysphagia 3 Diet and thin liquids-tolerating. ?Barrett's esophagus/small hiatal hernia  Continue long-term PPI therapy.  Esophageal biopsy suggesting finding secondary to reflux. No malignancy.  Consider repeat EGD in 3 years. Colonic polyp  Status post polypectomy. Pathology: Tubular adenoma. No high-grade dysplasia or malignancy. Repeat colonoscopy in 5 years. Hyperlipidemia  Zocor changed to an equivalent dose of Lipitor due to the potential drug interaction with Cardizem. Will need to be discharged on Lipitor if he goes home on Cardizem. Low back pain  Resolved. If he has recurrence, outpatient evaluation as deemed necessary. COPD (chronic obstructive pulmonary disease) with acute bronchitis  Chest x-ray negative for infiltrates, so empiric Levaquin discontinued.  Discontinued Solu-Medrol, continue Prednisone tapering  Improved and stable. Hypertension  Blood pressure currently controlled on  Cardizem. Tobacco abuse  Tobacco cessation counseling provided.  Continue nicotine patch. Heme positive stool  Status post EGD and colonoscopy. No active source of bleeding found. History of prostate cancer  Not on active treatment. S/P prostatectomy/radiation therapy. Stage III CKD (chronic kidney disease)  Patient's GFR ranges from 52-60, consistent with stage III chronic kidney disease.  Baseline creatinine 1.4-1.6. Current creatinine consistent with usual baseline values.   Consultations:  Cardiology  Gastroenterology  Procedures:  EGD and colonoscopy    Discharge Exam:  Complaints:  Patient complains of some lower abdominal pain-only on coughing. Had a small BM today-normal color stool without blood. No nausea vomiting. Tolerating diet. Denies back pain. No chest pain, palpitations, dyspnea or cough.  Filed Vitals:   04/09/14 0908 04/09/14 0928 04/09/14 1155 04/09/14 1330  BP:   134/87 115/76  Pulse:  99 76 72  Temp:    98 F (36.7 C)  TempSrc:    Oral  Resp:    18  Height:      Weight:      SpO2: 97%   97%    General: Pt is alert, follows commands appropriately, not in acute distress  Cardiovascular: S1 and S2 heard, irregularly irregular. No JVD, murmurs or pedal edema. Telemetry: A. fib with ventricular rate in the 100s-100s.  Respiratory: Clear to auscultation bilaterally, no wheezing, no crackles, no rhonchi  Abdomen: Soft, non tender, non distended, bowel sounds present, no guarding  CNS: Alert and oriented. No focal deficits.   Discharge Instructions      Discharge Instructions   Call MD for:  difficulty breathing, headache or visual disturbances    Complete by:  As directed      Discharge instructions    Complete by:  As directed   DIET: Dysphagia 3 diet and thin liquids.     Increase activity slowly    Complete by:  As directed             Medication List    STOP taking these medications       pravastatin 80 MG tablet  Commonly known  as:  PRAVACHOL      TAKE these medications       albuterol 108 (90 BASE) MCG/ACT inhaler  Commonly known as:  PROVENTIL HFA;VENTOLIN HFA  Inhale 2 puffs into the lungs every 6 (six) hours as needed for wheezing or shortness of breath.     ANORO ELLIPTA 62.5-25 MCG/INH Aepb  Generic drug:  Umeclidinium-Vilanterol  Inhale 1 puff into the lungs daily.     aspirin 81 MG tablet  Take 81 mg by mouth daily.     atorvastatin 20 MG tablet  Commonly known as:  LIPITOR  Take 1 tablet (20 mg total) by mouth daily at 6 PM.     beta carotene w/minerals tablet  Take 1 tablet by mouth daily.     clonazePAM 0.5 MG tablet  Commonly known as:  KLONOPIN  Take 1 tablet (0.5 mg total) by mouth daily.     diclofenac sodium 1 % Gel  Commonly known as:  VOLTAREN  Apply 2 g topically 4 (four) times daily.     digoxin 0.25 MG tablet  Commonly known as:  LANOXIN  Take 0.5 tablets (0.125 mg total) by mouth daily.     diltiazem 300 MG 24 hr capsule  Commonly known as:  CARDIZEM CD  Take 1 capsule (  300 mg total) by mouth daily.     fish oil-omega-3 fatty acids 1000 MG capsule  Take 2 g by mouth 2 (two) times daily.     metoprolol tartrate 25 MG tablet  Commonly known as:  LOPRESSOR  Take 1 tablet (25 mg total) by mouth 2 (two) times daily.     multivitamin with minerals Tabs tablet  Take 1 tablet by mouth daily.     nicotine 14 mg/24hr patch  Commonly known as:  NICODERM CQ - dosed in mg/24 hours  Place 1 patch (14 mg total) onto the skin daily.     nitroGLYCERIN 0.4 MG SL tablet  Commonly known as:  NITROSTAT  Place 0.4 mg under the tongue every 5 (five) minutes as needed for chest pain.     pantoprazole 40 MG tablet  Commonly known as:  PROTONIX  Take 1 tablet (40 mg total) by mouth daily.     polyethylene glycol packet  Commonly known as:  MIRALAX / GLYCOLAX  Take 17 g by mouth 2 (two) times daily.     predniSONE 10 MG tablet  Commonly known as:  DELTASONE  Take 2 tablets daily  for 2 days, then 1 tablet daily for 2 days, then stop.     senna-docusate 8.6-50 MG per tablet  Commonly known as:  Senokot-S  Take 1 tablet by mouth 2 (two) times daily.     SLEEP-AID MAXIMUM STRENGTH 50 MG Caps  Generic drug:  DiphenhydrAMINE HCl (Sleep)  Take 1 capsule by mouth at bedtime.     SYSTANE OP  Place 1 drop into both eyes 2 (two) times daily as needed (dry eyes).     tiotropium 18 MCG inhalation capsule  Commonly known as:  SPIRIVA  Place 18 mcg into inhaler and inhale daily as needed (shortness of breath).     warfarin 4 MG tablet  Commonly known as:  COUMADIN  Take 1 tablet (4 mg total) by mouth daily at 6 PM.  Start taking on:  04/10/2014       Follow-up Information   Follow up with Anthoney Harada, MD. Schedule an appointment as soon as possible for a visit in 4 days. (To be seen with repeat labs (CBC, BMP, PT & INR).)    Specialty:  Family Medicine   Contact information:   [place] [address] [phone]       Follow up with Laverda Page, MD. Schedule an appointment as soon as possible for a visit in 1 week.   Specialty:  Cardiology   Contact information:   [address] [place] [address] [phone]       Follow up with Silvano Rusk, MD. Schedule an appointment as soon as possible for a visit in 4 weeks.   Specialty:  Gastroenterology   Contact information:   [address] [place] [address] [phone]        The results of significant diagnostics from this hospitalization (including imaging, microbiology, ancillary and laboratory) are listed below for reference.    Significant Diagnostic Studies: Dg Chest 2 View  04/03/2014   CLINICAL DATA:  Short of breath.  Cough.  EXAM: CHEST  2 VIEW  COMPARISON:  03/16/2014  FINDINGS: Cardiac silhouette is borderline enlarged. Normal mediastinal and hilar contours. Lungs are mildly hyperexpanded. There is mild scarring at the apices. No lung  consolidation or edema. No pleural effusion or pneumothorax. Bony thorax is demineralized but intact.  IMPRESSION: No acute  cardiopulmonary disease.   Electronically Signed   By: Lajean Manes M.D.   On: 04/03/2014 21:07   Dg Chest 2 View  03/17/2014   CLINICAL DATA:  Cough and congestion  EXAM: CHEST  2 VIEW  COMPARISON:  DG CHEST 1V PORT dated 04/25/2013  FINDINGS: Normal cardiac silhouette. Lungs are hyperinflated. No effusion, infiltrate, pneumothorax. There is a nodular 1 cm density projecting over the left lower lobe which is not seen on prior.  IMPRESSION: 1. Hyperinflated lungs without acute findings. 2. Indeterminate nodular density at the left lung base. Recommend CT thorax without contrast for further evaluation.   Electronically Signed   By: Suzy Bouchard M.D.   On: 03/17/2014 08:28   Ct Chest W Contrast  03/20/2014   CLINICAL DATA:  Cough.  Pulmonary nodule.  EXAM: CT CHEST WITH CONTRAST  TECHNIQUE: Multidetector CT imaging of the chest was performed during intravenous contrast administration.  CONTRAST:  33mL OMNIPAQUE IOHEXOL 300 MG/ML  SOLN  COMPARISON:  Chest x-ray dated 03/16/2014  FINDINGS: There is no pulmonary nodule at the left lung base. There is some slight scarring at the 0 left base laterally. The density seen on chest x-ray was cup probably a confluence of osseous and vascular structures and this minimal interstitial accentuation.  The patient has bilateral peribronchial thickening with slight bibasilar bronchiectasis, right more than left. There are small emphysematous blebs bilaterally. There is a tiny area of scarring at the right lung apex. Heart size and pulmonary vascularity are normal. No adenopathy. No significant osseous abnormality. The visualized portion of the upper abdomen demonstrates bilateral adrenal hyperplasia but is otherwise normal.  IMPRESSION: 1. No pulmonary nodule at the left lung base or elsewhere in the lungs. 2. Emphysema with bibasilar bronchiectasis and  chronic bronchitic changes.   Electronically Signed   By: Rozetta Nunnery M.D.   On: 03/20/2014 16:28   Dg Swallowing Func-speech Pathology  04/07/2014   Joaquim Nam, CCC-SLP     04/07/2014  2:58 PM Objective Swallowing Evaluation: Modified Barium Swallowing Study   Patient Details  Name: JAKAYDEN CANCIO MRN: [id number] Date of Birth: 10-12-1941  Today's Date: 04/07/2014 Time: 1340-1420 SLP Time Calculation (min): 40 min  Past Medical History:  Past Medical History  Diagnosis Date  . Hyperlipidemia   . Hypertension   . COPD (chronic obstructive pulmonary disease)   . Prostate cancer   . Tubular adenoma of colon 07/31/02, 11/18/03  . Atrial fibrillation   . Noncompliance with medications 04/2013    xarelto, digoxin  . Tobacco abuse     ongoing  . Stage III chronic kidney disease 04/03/2014   Past Surgical History:  Past Surgical History  Procedure Laterality Date  . 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 Anal abcess,hemorroids,    . Esophagogastroduodenoscopy  11/18/2003    Dr. Earle Gell  . Colonoscopy w/ biopsies  11/18/2003    Dr. Earle Gell  . Tonsillectomy    . Colonoscopy N/A 04/05/2014    Procedure: COLONOSCOPY;  Surgeon: Ladene Artist, MD;   Location: [facility] ENDOSCOPY;  Service: Endoscopy;  Laterality: N/A;  . Esophagogastroduodenoscopy N/A 04/05/2014    Procedure: ESOPHAGOGASTRODUODENOSCOPY (EGD);  Surgeon: Ladene Artist, MD;  Location: [facility];  Service: Endoscopy;   Laterality: N/A;   HPI:  Don [name] is an 73 y.o. male with PMH of atrial fibrillation  on digoxin/diltiazem/anticoagulated with xarelto, COPD with  ongoing tobacco use and hypertension who presented to [facility]  04/03/14 with cough,  wheezing, chest discomfort and nausea. The  patient had stopped his Xarelto 1-2 months ago secondary to dark  stools. Upon initial evaluation in the ER, the patient was found  to be in atrial fibrillation with rapid ventricular response,  likely triggered by a COPD exacerbation. His initial troponins   were mildly elevated. He subsequently was transferred to [facility] for  cardiology and GI consultation.  Increased cough, dx'd with  bronchitis.  recent choking episode with a potato, requiring  Heimlech by RN.  GI following.  Endoscopy revealed Hiatal hernia  and question of Barrett's esophagus.     Assessment / Plan / Recommendation Clinical Impression  Dysphagia Diagnosis: Mild pharyngeal phase dysphagia;Mild oral  phase dysphagia Clinical impression: Mild oral prep difficulty due to being  edentulous; mild pharyngeal phase dysphagia with decreased  laryngeal elevation, resulting in penetration of thin liquids to  the level of the cords, during the swallow.  This was prevented  with use of chin tuck, but patient required moderate verbal cues  to use chin tuck effectively.    Treatment Recommendation  F/U MBS in ___ days (Comment) (1)    Diet Recommendation Dysphagia 3 (Mechanical Soft);Thin liquid   Liquid Administration via: Cup;No straw Medication Administration: Whole meds with puree Supervision: Full supervision/cueing for compensatory  strategies;Patient able to self feed Compensations: Slow rate;Small sips/bites;Follow solids with  liquid Postural Changes and/or Swallow Maneuvers: Out of bed for  meals;Seated upright 90 degrees;Upright 30-60 min after meal;Chin  tuck    Other  Recommendations Recommended Consults: MBS Oral Care Recommendations: Oral care BID;Patient independent with  oral care Other Recommendations: Clarify dietary restrictions   Follow Up Recommendations  None    Frequency and Duration min 2x/week  2 weeks   Pertinent Vitals/Pain Choked on potato, requiring the Heimlech by  RN.  LS: Inspiratory Wheeze; CXR:  NAD, afebrile.    SLP Swallow Goals     General HPI: JEAN SKOW is an 73 y.o. male with PMH of atrial  fibrillation on digoxin/diltiazem/anticoagulated with xarelto,  COPD with ongoing tobacco use and hypertension who presented to  [facility] 04/03/14 with cough, wheezing, chest  discomfort and  nausea. The patient had stopped his Xarelto 1-2 months ago  secondary to dark stools. Upon initial evaluation in the ER, the  patient was found to be in atrial fibrillation with rapid  ventricular response, likely triggered by a COPD exacerbation.  His initial troponins were mildly elevated. He subsequently was  transferred to [facility] for cardiology and GI consultation.  Increased  cough, dx'd with bronchitis.  recent choking episode with a  potato, requiring Heimlech by RN.  GI following.  Endoscopy  revealed Hiatal hernia and question of Barrett's esophagus. Type of Study: Modified Barium Swallowing Study Reason for Referral: Objectively evaluate swallowing function Previous Swallow Assessment: BSE this am; coughed after liquids Diet Prior to this Study: Dysphagia 3 (soft);Nectar-thick liquids Temperature Spikes Noted: No Respiratory Status: Room air History of Recent Intubation: No Behavior/Cognition: Alert;Cooperative;Pleasant mood Oral Cavity - Dentition: Edentulous Oral Motor / Sensory Function: Within functional limits Self-Feeding Abilities: Able to feed self Patient Positioning: Upright in chair Baseline Vocal Quality: Clear Volitional Cough: Strong Volitional Swallow: Able to elicit Anatomy: Within functional limits Pharyngeal Secretions: Not observed secondary MBS    Reason for Referral Objectively evaluate swallowing function   Oral Phase Oral Preparation/Oral Phase Oral Phase: WFL   Pharyngeal Phase Pharyngeal Phase Pharyngeal Phase: Impaired Pharyngeal - Thin Pharyngeal - Thin Cup: Reduced epiglottic inversion;Reduced  anterior laryngeal mobility;Reduced laryngeal  elevation;Penetration/Aspiration during swallow;Compensatory  strategies attempted (Comment) (Chin tuck prevented penetration) Penetration/Aspiration details (thin cup): Material enters  airway, CONTACTS cords and not ejected out  Cervical Esophageal Phase    GO    Cervical Esophageal Phase Cervical Esophageal Phase: Darryll Capers          Joaquim Nam 04/07/2014, 2:56 PM     Microbiology: Recent Results (from the past 240 hour(s))  MRSA PCR SCREENING     Status: None   Collection Time    04/04/14 12:59 AM      Result Value Ref Range Status   MRSA by PCR NEGATIVE  NEGATIVE Final   Comment:            The GeneXpert MRSA Assay (FDA     approved for NASAL specimens     only), is one component of a     comprehensive MRSA colonization     surveillance program. It is not     intended to diagnose MRSA     infection nor to guide or     monitor treatment for     MRSA infections.     Labs: Basic Metabolic Panel:  Recent Labs Lab 04/03/14 2105 04/04/14 0130 04/05/14 0418 04/07/14 0434 04/08/14 0333  NA 138 137 140 139 139  K 5.1 4.6 4.4 4.0 4.0  CL 101 98 101 101 102  CO2 25 25 24 25 27   GLUCOSE 95 111* 187* 200* 192*  BUN 24* 23 33* 37* 39*  CREATININE 1.47* 1.44* 1.66* 1.55* 1.65*  CALCIUM 9.2 8.9 8.8 8.2* 8.3*   Liver Function Tests:  Recent Labs Lab 04/04/14 0130  AST 26  ALT 24  ALKPHOS 97  BILITOT 1.1  PROT 6.9  ALBUMIN 3.4*   No results found for this basename: LIPASE, AMYLASE,  in the last 168 hours No results found for this basename: AMMONIA,  in the last 168 hours CBC:  Recent Labs Lab 04/03/14 2105 04/04/14 0130 04/05/14 0418 04/07/14 0434 04/08/14 0333  WBC 13.3* 13.1* 14.2* 10.8* 8.4  HGB 14.2 13.9 13.7 13.3 13.2  HCT 42.7 41.3 40.7 39.7 38.7*  MCV 96.4 96.7 95.1 95.4 94.4  PLT 258 224 248 217 241   Cardiac Enzymes:  Recent Labs Lab 04/03/14 2105 04/04/14 0130 04/04/14 0651 04/04/14 1304  TROPONINI 0.40* 0.42* <0.30 <0.30   BNP: BNP (last 3 results)  Recent Labs  04/03/14 2105  PROBNP 3436.0*   CBG:  Recent Labs Lab 04/04/14 1713  GLUCAP 195*      Signed:  Modena Jansky, MD, FACP, [facility]. [facility] Pager [phone]  If 7PM-7AM, please contact night-coverage [email or web address] Password [facility] 04/09/2014, 3:34 PM

## 2014-04-09 NOTE — Progress Notes (Signed)
       Patient Name: Don [name] Date of Encounter: 04/09/2014    SUBJECTIVE: He is looking forward to going home or being transferred to the [facility]:  Average heart rate is running in the 90-100 range on monitor with underlying atrial fibrillation. Filed Vitals:   04/09/14 0520 04/09/14 0908 04/09/14 0928 04/09/14 1155  BP: 145/72   134/87  Pulse: 96  99 76  Temp: 98 F (36.7 C)     TempSrc: Oral     Resp: 19     Height:      Weight:      SpO2: 94% 97%      Intake/Output Summary (Last 24 hours) at 04/09/14 1309 Last data filed at 04/09/14 0743  Gross per 24 hour  Intake    603 ml  Output      0 ml  Net    603 ml   LABS: Basic Metabolic Panel:  Recent Labs  04/07/14 0434 04/08/14 0333  NA 139 139  K 4.0 4.0  CL 101 102  CO2 25 27  GLUCOSE 200* 192*  BUN 37* 39*  CREATININE 1.55* 1.65*  CALCIUM 8.2* 8.3*   CBC:  Recent Labs  04/07/14 0434 04/08/14 0333  WBC 10.8* 8.4  HGB 13.3 13.2  HCT 39.7 38.7*  MCV 95.4 94.4  PLT 217 241     Radiology/Studies:  No new data  Physical Exam: Blood pressure 134/87, pulse 76, temperature 98 F (36.7 C), temperature source Oral, resp. rate 19, height 5\' 9"  (1.753 m), weight 169 lb 8 oz (76.885 kg), SpO2 97.00%. Weight change: -2 lb 0.3 oz (-0.915 kg)  Wt Readings from Last 3 Encounters:  04/09/14 169 lb 8 oz (76.885 kg)  04/09/14 169 lb 8 oz (76.885 kg)  04/09/14 169 lb 8 oz (76.885 kg)   Patient is lying flat in bed with no dyspnea Irregularly irregular rhythm  ASSESSMENT:  1. Atrial fibrillation with moderate rate control 2. Chronic anticoagulation in setting of recurrent GI bleeding  Plan:  He is tolerating the current medical regimen without difficulty. No change in therapy recommended from our standpoint  Signed, Belva Crome III 04/09/2014, 1:09 PM

## 2014-04-09 NOTE — Discharge Instructions (Signed)
Dysphagia Diet with Thickened Liquids When you are diagnosed with difficulty swallowing (dysphagia), your health care provider may recommend a texture-modified diet. This diet may involve you thickening your liquids to make them easier and safer to swallow. This makes them less likely to enter into the lungs and cause aspiration pneumonia. Aspiration pneumonia is when your lungs become inflamed from breathing in foreign material.  You may have to thicken your liquids using commercial thickeners or purchase prethickened products. Please follow product guidelines for thickening liquids to the recommended consistency.  There are four standard consistencies of liquids that are recognized by the National Dysphagia Diet:  Thin.   Nectar-like.  Honey-like.  Spoon-thick. THIN LIQUIDS Thin liquids require no additional preparation. They include:  Water.  Juice.   Tea.  Coffee.  Milk.   Anything that can melt into a thin liquid such as gelatin dessert and ice cream.   Broth. NECTAR THICK Nectar-thick liquids fall slowly from a spoon and resemble the thickness of peach nectar. Some foods are naturally nectar thick such as buttermilk, eggnog, milk shakes, tomato juice, supplements, and cream soups. Any other liquids will need to be pre-thickened or thickened with a commercial thickener to a nectar-thick consistency. Please follow product guidelines. HONEY THICK Honey-thick liquids can fall from a spoon but are too thick to sip from a straw. They resemble the thickness of honey. Tomato sauce would be another naturally occurring product that is honey thick. Any other liquids will need to be prethickened or thickened with a commercial thickener to a honey-thick consistency. Please follow product guidelines.  SPOON THICK Spoon-thick liquids maintain their shape and need to be taken with a spoon. Pudding is an example of a spoon-thick liquid. Any other liquids consumed would need to be thickened  with a commercial thickener to a spoon-thick consistency. Please follow product guidelines.  Document Released: 04/30/2012 Document Revised: 07/02/2013 Document Reviewed: 04/08/2013 [facility] Patient Information 2014 [place].

## 2014-04-09 NOTE — Progress Notes (Signed)
Patient discharged home with son, discharge instructions given and explained to patient and he verbalized understanding, denies any pain/distress. No wound noted, skin intact. Accompanied home by son.

## 2014-04-09 NOTE — Progress Notes (Signed)
Came to visit patient to offer and explain [facility] Care Management services. Reports he lives alone currently as his wife is in a nursing home. Patient reports he could use additional support regarding transportation, medication education, and disease management education. Spoke with patient at bedside along with inpatient RNCM. Patient will benefit from home health as well. Made patient aware that [facility] will not interfere with home health services. He will receive post hospital discharge calls and will be evaluated for monthly home visits. Of note, patient states he will eventually go to having [facility] but reports it is a process and he is not sure when that will happen. Right now he is eligible for [facility] Care Management services. Consents obtained. Left packet at bedside. Appreciative of visit. Marthenia Rolling, MSN- [facility] [phone]

## 2014-04-09 NOTE — Progress Notes (Signed)
Speech Language Pathology Treatment: Dysphagia  Patient Details Name: Don [name] MRN: [id number] DOB: July 25, 1941 Today's Date: 04/09/2014 Time: 6712-4580 SLP Time Calculation (min): 23 min  Assessment / Plan / Recommendation Clinical Impression  Pt observed consuming lunch - he recalled need to tuck his chin with intake and benefited from minimal verbal cues to implement.  Delayed cough x1 noted after intake. SLP reviewed previous MBS (Silent penetration to vocal cords with thin) with pt and clinical indication for chin tuck.  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    Pt states choking on potato occurred due to edentulous status preventing adequate mastication of foods.  Requested pt attempt to contact [facility] to see if can get assistance with dentures.    Recommend to continue diet with chin tuck and precautions.  SLP made photo copy of precautions for pt and posted.  Will sign off as all education completed.  SLP to sign off as all goals met and pt tolerating po diet.    HPI HPI: Don [name] is an 73 y.o. male with PMH of atrial fibrillation on digoxin/diltiazem/anticoagulated with xarelto, COPD with ongoing tobacco use and hypertension who presented to [facility] 04/03/14 with cough, wheezing, chest discomfort and nausea. The patient had stopped his Xarelto 1-2 months ago secondary to dark stools. Upon initial evaluation in the ER, the patient was found to be in atrial fibrillation with rapid ventricular response, likely triggered by a COPD exacerbation. His initial troponins were mildly elevated. He subsequently was transferred to [facility] for cardiology and GI consultation.  Increased cough, dx'd with bronchitis.  recent choking episode with a potato, requiring Heimlech by RN.  GI following.  Endoscopy revealed Hiatal hernia and question of Barrett's esophagus.  MBS completed due to concerns for aspiration with recommendations for soft/thin with chin tuck posture.  SLP follow up to  assess po tolerance and reinforce effective compensation strategy.     Pertinent Vitals Afebrile, decreased  SLP Plan  All goals met    Recommendations Diet recommendations: Dysphagia 3 (mechanical soft);Thin liquid Liquids provided via: Cup;Straw Medication Administration: Whole meds with puree Supervision: Full supervision/cueing for compensatory strategies;Patient able to self feed Compensations: Slow rate;Small sips/bites;Follow solids with liquid Postural Changes and/or Swallow Maneuvers: Out of bed for meals;Seated upright 90 degrees;Upright 30-60 min after meal;Chin tuck              Oral Care Recommendations: Oral care BID;Patient independent with oral care Follow up Recommendations: None Plan: All goals met    Conrad, [facility] SLP [phone]

## 2014-04-09 NOTE — Progress Notes (Signed)
CARE MANAGEMENT NOTE 04/09/2014  Patient:  Don [name], Don [name]   Account Number:  [id number]  Date Initiated:  04/08/2014  Documentation initiated by:  Gabriel Earing  Subjective/Objective Assessment:   pt admitted with cough GIB. In ED Afib with RVR     Action/Plan:   from home   Anticipated DC Date:  04/09/2014   Anticipated DC Plan:  [place]  CM consult  Follow-up appt scheduled      Choice offered to / List presented to:  C-1 Patient        [place] arranged  HH-1 RN  [place].   Status of service:  In process, will continue to follow Medicare Important Message given?  YES (If response is "NO", the following Medicare IM given date fields will be blank) Date Medicare IM given:  04/09/2014 Date Additional Medicare IM given:    Discharge Disposition:  HOME/HEALTH  Per UR Regulation:    If discussed at Long Length of Stay Meetings, dates discussed:   04/09/2014    Comments:  04/08/14 MMCGIBBONEY, RN, BSN Pt is [facility]. [facility] called. Pt agrees to going to the [place]. MD aware and to examine pt.

## 2014-04-09 NOTE — Progress Notes (Signed)
ANTICOAGULATION CONSULT NOTE - Follow Up Consult  Pharmacy Consult for Warfarin Indication: atrial fibrillation  Allergies  Allergen Reactions  . Wellbutrin [Bupropion] Anxiety   Patient Measurements: Height: 5\' 9"  (175.3 cm) Weight: 169 lb 8 oz (76.885 kg) IBW/kg (Calculated) : 70.7  Vital Signs: Temp: 98 F (36.7 C) (05/28 0520) Temp src: Oral (05/28 0520) BP: 145/72 mmHg (05/28 0520) Pulse Rate: 99 (05/28 0928)  Labs:  Recent Labs  04/07/14 0434 04/08/14 0333 04/09/14 0315  HGB 13.3 13.2  --   HCT 39.7 38.7*  --   PLT 217 241  --   LABPROT 13.6 15.0 19.4*  INR 1.06 1.21 1.69*  CREATININE 1.55* 1.65*  --    Estimated Creatinine Clearance: 39.3 ml/min (by C-G formula based on Cr of 1.65).  Assessment: 72 y.o. male with PMH of atrial fibrillation on digoxin/anticoagulated with xarelto. The patient had stopped his Xarelto 1-2 months ago secondary to dark stools. Upon initial evaluation in the ER, the patient was found to be in atrial fibrillation with rapid ventricular response, likely triggered by a COPD exacerbation-Diltiazem added. His initial troponins were mildly elevated. He subsequently was transferred to [facility] for cardiology and GI consultation - possible Barrett's and Colon polyp identified. Patient declined new oral anticoagulants, therefore, beginning warfarin 5/25. Per GI, ok to resume anticoagulation.  INR 1.69 today after Warfarin 5mg  daily x3  CBC has been wnl since admission  No significant drug interactions present; aspirin 81mg  daily discontinued now that on warfarin therapy given bleed history and no additional indications for aspirin use.  Cardiac diet ordered, patient eating 100% of meals  Goal of Therapy:  INR 2-3   Plan:   Warfarin 5mg  PO x 1 today  Daily PT/INR Completed warfarin education with patient.  Patient verbalized understanding of the safety and use of warfarin therapy (including drug/food interactions, s/sx's bleeding, INR  monitoring).  Minda Ditto PharmD Pager [phone] 04/09/2014, 10:46 AM

## 2014-04-10 ENCOUNTER — Other Ambulatory Visit: Payer: Self-pay | Admitting: Family Medicine

## 2014-04-10 ENCOUNTER — Other Ambulatory Visit: Payer: Self-pay

## 2014-04-10 ENCOUNTER — Ambulatory Visit (HOSPITAL_COMMUNITY)
Admission: RE | Admit: 2014-04-10 | Discharge: 2014-04-10 | Disposition: A | Discharge status: Home / Self Care | Payer: Medicare HMO | Source: Ambulatory Visit | Attending: Family Medicine | Admitting: Family Medicine

## 2014-04-10 ENCOUNTER — Telehealth: Payer: Self-pay

## 2014-04-10 DIAGNOSIS — M79605 Pain in left leg: Secondary | ICD-10-CM

## 2014-04-10 DIAGNOSIS — Z8546 Personal history of malignant neoplasm of prostate: Secondary | ICD-10-CM | POA: Insufficient documentation

## 2014-04-10 DIAGNOSIS — M79662 Pain in left lower leg: Secondary | ICD-10-CM

## 2014-04-10 DIAGNOSIS — M7989 Other specified soft tissue disorders: Secondary | ICD-10-CM | POA: Insufficient documentation

## 2014-04-10 DIAGNOSIS — M79609 Pain in unspecified limb: Secondary | ICD-10-CM | POA: Insufficient documentation

## 2014-04-10 NOTE — Telephone Encounter (Signed)
It is okay to do the venous Doppler

## 2014-04-10 NOTE — Telephone Encounter (Signed)
They are doing a home visit on Don [name] today and he has a sore on his L calf that has positive homen's sign  Started last week   Order put through for Venous Doppler to R/O DVT is that OK?

## 2014-04-13 ENCOUNTER — Ambulatory Visit (INDEPENDENT_AMBULATORY_CARE_PROVIDER_SITE_OTHER): Payer: Medicare HMO | Admitting: Family Medicine

## 2014-04-13 ENCOUNTER — Encounter: Payer: Self-pay | Admitting: Family Medicine

## 2014-04-13 VITALS — BP 128/75 | HR 72 | Temp 97.4°F | Ht 67.5 in | Wt 171.0 lb

## 2014-04-13 DIAGNOSIS — R05 Cough: Secondary | ICD-10-CM

## 2014-04-13 DIAGNOSIS — I4891 Unspecified atrial fibrillation: Secondary | ICD-10-CM

## 2014-04-13 DIAGNOSIS — D126 Benign neoplasm of colon, unspecified: Secondary | ICD-10-CM

## 2014-04-13 DIAGNOSIS — R059 Cough, unspecified: Secondary | ICD-10-CM

## 2014-04-13 DIAGNOSIS — J441 Chronic obstructive pulmonary disease with (acute) exacerbation: Secondary | ICD-10-CM

## 2014-04-13 LAB — POCT INR: INR: 2.9

## 2014-04-13 MED ORDER — HYDROCODONE-HOMATROPINE 5-1.5 MG/5ML PO SYRP: 5.0000 mL | ORAL_SOLUTION | Freq: Three times a day (TID) | ORAL | Status: DC | PRN

## 2014-04-13 NOTE — Progress Notes (Signed)
   Subjective:    Patient ID: Don [name], male    DOB: 11/30/1940, 73 y.o.   MRN: [id number]  HPI  This 73 y.o. male presents for evaluation of hospital follow up.  He was hospitalized for COPD exacerbation.  He was discharged on 04/09/14.  He has been taking warfarin for atrial fibrillation. He is feeling better.  He has been having a bruise on left thigh.  He states he was given card for GI and cardiology referral and is going to call and get appointment.  He had afib with rvr due to copd flare and was put on digoxin.  He is also taking diltiazem for rate control and hypertension.  He had dark stools when taking xarelto and stopped taking this so he was started on coumadin 4mg  po qd.  Review of Systems C/o uri sx's and cough   No chest pain, SOB, HA, dizziness, vision change, N/V, diarrhea, constipation, dysuria, urinary urgency or frequency, myalgias, arthralgias or rash.  Objective:   Physical Exam   Vital signs noted  Chronically ill appearing male in NAD  HEENT - Head atraumatic Normocephalic                Eyes - PERRLA, Conjuctiva - clear Sclera- Clear EOMI                Ears - EAC's Wnl TM's Wnl Gross Hearing WNL                Throat - oropharanx wnl Respiratory - Lungs CTA bilateral Cardiac - RRR S1 and S2 without murmur GI - Abdomen soft Nontender and bowel sounds active x 4      Assessment & Plan:  Atrial fibrillation - Plan: POCT INR follow up with cardiology.  Follow up in 2 weeks for repeat INR and appointment.  COPD exacerbation - Plan: POCT INR appears stable with breathing follow up in 2 weeks  Colon polyps - Follow up with GI  Lysbeth Penner FNP

## 2014-04-13 NOTE — Patient Instructions (Addendum)
Anticoagulation Dose Instructions as of 04/13/2014     Don [name] Tue Wed Thu Fri Sat   New Dose 4 mg 4 mg 4 mg 4 mg 4 mg 4 mg 4 mg    follow up in 2 weeks

## 2014-04-13 NOTE — Progress Notes (Signed)
Pt aware before he left hospital

## 2014-04-16 ENCOUNTER — Other Ambulatory Visit: Payer: Self-pay | Admitting: *Deleted

## 2014-04-16 DIAGNOSIS — R059 Cough, unspecified: Secondary | ICD-10-CM

## 2014-04-16 DIAGNOSIS — R05 Cough: Secondary | ICD-10-CM

## 2014-04-16 DIAGNOSIS — J209 Acute bronchitis, unspecified: Secondary | ICD-10-CM

## 2014-04-16 MED ORDER — ALBUTEROL SULFATE HFA 108 (90 BASE) MCG/ACT IN AERS: 2.0000 | INHALATION_SPRAY | Freq: Four times a day (QID) | RESPIRATORY_TRACT | Status: DC | PRN

## 2014-04-21 ENCOUNTER — Other Ambulatory Visit: Payer: Self-pay | Admitting: *Deleted

## 2014-04-21 DIAGNOSIS — F411 Generalized anxiety disorder: Secondary | ICD-10-CM

## 2014-04-21 NOTE — Telephone Encounter (Signed)
Patient last seen in office on 04-13-14. Please advise. If approved please route to pool A so nurse can call in to [place]. [phone]

## 2014-04-22 MED ORDER — CLONAZEPAM 0.5 MG PO TABS
0.5000 mg | ORAL_TABLET | Freq: Every day | ORAL | Status: DC
Start: ? — End: 2014-04-24

## 2014-04-24 ENCOUNTER — Ambulatory Visit (INDEPENDENT_AMBULATORY_CARE_PROVIDER_SITE_OTHER): Payer: Medicare HMO | Admitting: Family Medicine

## 2014-04-24 ENCOUNTER — Encounter: Payer: Self-pay | Admitting: Family Medicine

## 2014-04-24 VITALS — BP 127/60 | HR 49 | Temp 97.4°F | Ht 67.5 in | Wt 175.4 lb

## 2014-04-24 DIAGNOSIS — F411 Generalized anxiety disorder: Secondary | ICD-10-CM

## 2014-04-24 DIAGNOSIS — I4891 Unspecified atrial fibrillation: Secondary | ICD-10-CM

## 2014-04-24 DIAGNOSIS — E785 Hyperlipidemia, unspecified: Secondary | ICD-10-CM

## 2014-04-24 DIAGNOSIS — K219 Gastro-esophageal reflux disease without esophagitis: Secondary | ICD-10-CM

## 2014-04-24 DIAGNOSIS — M659 Synovitis and tenosynovitis, unspecified: Secondary | ICD-10-CM

## 2014-04-24 DIAGNOSIS — M65849 Other synovitis and tenosynovitis, unspecified hand: Secondary | ICD-10-CM

## 2014-04-24 DIAGNOSIS — M65839 Other synovitis and tenosynovitis, unspecified forearm: Secondary | ICD-10-CM

## 2014-04-24 LAB — POCT INR: INR: 2.4

## 2014-04-24 MED ORDER — DULOXETINE HCL 60 MG PO CPEP: 60.0000 mg | ORAL_CAPSULE | Freq: Every day | ORAL | Status: DC

## 2014-04-24 MED ORDER — DICLOFENAC SODIUM 1 % TD GEL: 2.0000 g | Freq: Four times a day (QID) | TRANSDERMAL | Status: DC

## 2014-04-24 MED ORDER — PRAVASTATIN SODIUM 80 MG PO TABS: 80.0000 mg | ORAL_TABLET | Freq: Every day | ORAL | Status: DC

## 2014-04-24 MED ORDER — CLONAZEPAM 0.5 MG PO TABS: 0.5000 mg | ORAL_TABLET | Freq: Every day | ORAL | Status: DC

## 2014-04-24 MED ORDER — WARFARIN SODIUM 4 MG PO TABS: 4.0000 mg | ORAL_TABLET | Freq: Every day | ORAL | Status: DC

## 2014-04-24 MED ORDER — PANTOPRAZOLE SODIUM 40 MG PO TBEC: 40.0000 mg | DELAYED_RELEASE_TABLET | Freq: Every day | ORAL | Status: DC

## 2014-04-24 MED ORDER — DILTIAZEM HCL ER COATED BEADS 300 MG PO CP24: 300.0000 mg | ORAL_CAPSULE | Freq: Every day | ORAL | Status: DC

## 2014-04-24 MED ORDER — METOPROLOL TARTRATE 25 MG PO TABS: 25.0000 mg | ORAL_TABLET | Freq: Two times a day (BID) | ORAL | Status: DC

## 2014-04-24 MED ORDER — DIGOXIN 250 MCG PO TABS: 0.1250 mg | ORAL_TABLET | Freq: Every day | ORAL | Status: DC

## 2014-04-24 NOTE — Patient Instructions (Signed)
Anticoagulation Dose Instructions as of 04/24/2014     [name] Tue Wed Thu Fri Sat   New Dose 4 mg 4 mg 4 mg 4 mg 4 mg 4 mg 4 mg    Description       Follow up in 4 weeks

## 2014-04-24 NOTE — Progress Notes (Signed)
   Subjective:    Patient ID: Don [name], male    DOB: 10/19/1941, 73 y.o.   MRN: [id number]  HPI  This 73 y.o. male presents for evaluation of follow up on atrial fibrillation and copd.  He is feeling better. He comes in for INR.  He cannot tolerate xarelto.  He needs refills.  He has not seen cardiology.  He has new onset Afib when hospitalized for copd exacerbationz.  Review of Systems    No chest pain, SOB, HA, dizziness, vision change, N/V, diarrhea, constipation, dysuria, urinary urgency or frequency, myalgias, arthralgias or rash.  Objective:   Physical Exam  Vital signs noted  Chronically ill appearing male in NAD.  HEENT - Head atraumatic Normocephalic                Eyes - PERRLA, Conjuctiva - clear Sclera- Clear EOMI                Ears - EAC's Wnl TM's Wnl Gross Hearing WNL                Nose - Nares patent                 Throat - oropharanx wnl Respiratory - Lungs CTA bilateral Cardiac - RRR S1 and S2 without murmur GI - Abdomen soft Nontender and bowel sounds active x 4 Extremities - No edema. Neuro - Grossly intact.      Assessment & Plan:  Atrial fibrillation - Plan: POCT INR, POCT INR, warfarin (COUMADIN) 4 MG tablet, metoprolol tartrate (LOPRESSOR) 25 MG tablet, diltiazem (CARDIZEM CD) 300 MG 24 hr capsule, digoxin (LANOXIN) 0.25 MG tablet, Ambulatory referral to Cardiology  HLD (hyperlipidemia) - Plan: pravastatin (PRAVACHOL) 80 MG tablet  Generalized anxiety disorder - Plan: DULoxetine (CYMBALTA) 60 MG capsule, clonazePAM (KLONOPIN) 0.5 MG tablet, DISCONTINUED: clonazePAM (KLONOPIN) 0.5 MG tablet, DISCONTINUED: clonazePAM (KLONOPIN) 0.5 MG tablet  Tenosynovitis of thumb - Plan: diclofenac sodium (VOLTAREN) 1 % GEL  GERD (gastroesophageal reflux disease) - Plan: pantoprazole (PROTONIX) 40 MG tablet  Follow up in 3 months  Follow up with Tammy Eckerd in one month  Lysbeth Penner FNP

## 2014-05-06 ENCOUNTER — Encounter: Payer: Self-pay | Admitting: Internal Medicine

## 2014-05-06 ENCOUNTER — Ambulatory Visit (INDEPENDENT_AMBULATORY_CARE_PROVIDER_SITE_OTHER): Payer: Commercial Managed Care - HMO | Admitting: Internal Medicine

## 2014-05-06 VITALS — BP 134/86 | HR 84 | Ht 67.5 in | Wt 164.1 lb

## 2014-05-06 DIAGNOSIS — F17200 Nicotine dependence, unspecified, uncomplicated: Secondary | ICD-10-CM

## 2014-05-06 DIAGNOSIS — K648 Other hemorrhoids: Secondary | ICD-10-CM

## 2014-05-06 DIAGNOSIS — Z86010 Personal history of colonic polyps: Secondary | ICD-10-CM

## 2014-05-06 DIAGNOSIS — K5900 Constipation, unspecified: Secondary | ICD-10-CM

## 2014-05-06 NOTE — Patient Instructions (Addendum)
Glad to hear you are better. See me as needed. Continue MiraLax as needed. Try to stop smoking or at least cut down.  You have been given a separate informational sheet regarding your tobacco use, the importance of quitting and local resources to help you quit.  I appreciate the opportunity to care for you. Gatha Mayer, MD, [facility]

## 2014-05-06 NOTE — Assessment & Plan Note (Signed)
Guidelines indicate repeat at age 73 - may not need then

## 2014-05-06 NOTE — Progress Notes (Signed)
    Subjective:    Patient ID: Don [name], male    DOB: September 24, 1941, 73 y.o.   MRN: [id number]  HPI  Man I met in February, he had some rectal bleeding. Colonoscopy was to be scheduled, he needed to arrange transportation but was hospitalized before he could get that done. No bleeding since hospitalization for Afib and RVR  Had EGD/colonoscopy by Dr. Fuller Plan - 6 mm adenoma, internal hemorrhoids,   Uses prn MiraLax with success for intermittent constipation  Review of Systems As above    Objective:   Physical Exam Elderly NAD    Assessment & Plan:  Hemorrhoids, internal, with bleeding  constipation - intermittent  Personal history of colonic polyps - adenoma  Smoker  1. He can see me as needed at this point 2. Continue MiraLax intermittently 3. Consider repeat colonoscopy in 5 years we would be 57 with his health problems and might not make sense  I appreciate the opportunity to care for this patient.

## 2014-05-12 DIAGNOSIS — I129 Hypertensive chronic kidney disease with stage 1 through stage 4 chronic kidney disease, or unspecified chronic kidney disease: Secondary | ICD-10-CM

## 2014-05-12 DIAGNOSIS — N183 Chronic kidney disease, stage 3 unspecified: Secondary | ICD-10-CM

## 2014-05-12 DIAGNOSIS — I4891 Unspecified atrial fibrillation: Secondary | ICD-10-CM

## 2014-05-12 DIAGNOSIS — J449 Chronic obstructive pulmonary disease, unspecified: Secondary | ICD-10-CM

## 2014-05-25 ENCOUNTER — Ambulatory Visit (INDEPENDENT_AMBULATORY_CARE_PROVIDER_SITE_OTHER): Payer: Medicare HMO | Admitting: Pharmacist

## 2014-05-25 DIAGNOSIS — Z79899 Other long term (current) drug therapy: Secondary | ICD-10-CM

## 2014-05-25 DIAGNOSIS — I4891 Unspecified atrial fibrillation: Secondary | ICD-10-CM

## 2014-05-25 DIAGNOSIS — I480 Paroxysmal atrial fibrillation: Secondary | ICD-10-CM | POA: Insufficient documentation

## 2014-05-25 LAB — POCT INR: INR: 1.2

## 2014-05-25 NOTE — Patient Instructions (Signed)
Anticoagulation Dose Instructions as of 05/25/2014     Don [name] Tue Wed Thu Fri Sat   New Dose 4 mg 4 mg 4 mg 4 mg 4 mg 4 mg 4 mg    Description       Restart warfarin 4mg  take 1 tablet daily      INR was 1.2 today (too thick)

## 2014-05-25 NOTE — Progress Notes (Signed)
CC:  Medication management.    Patient brings in bag of medications from [place] and this weeks medication packaging from [place].  He was told by [facility] that they could not fill his packages with medciations from outside pharmacy.  Patient gets medications from [place] for free.  He is very confused about what he should be taking.  I have reviewed 4 lists of medciations - 1 from [place], 1 from [place], 1 from when he was discharged about 2 months ago and the list in [place].   Assessment:   Medication management.  subtherapeutic anticoag - due to non compliance  Plan: Patient is to restart using packaging from [place] since he has 3 weeks left and it contains warfarin.   There are 3 medcations from [place] that are not in packages - fish oil, MVI and terazosin.  Patient is instructed on only take these three medication from bottle along with packaged medications.  He is going to get #4 night/day medication containers and bring to office for me to fill at next weeks appt.  Anticoagulation Dose Instructions as of 05/25/2014     [name] Tue Wed Thu Fri Sat   New Dose 4 mg 4 mg 4 mg 4 mg 4 mg 4 mg 4 mg    Description       Restart warfarin 4mg  take 1 tablet daily       Cherre Robins, PharmD, CPP

## 2014-05-27 ENCOUNTER — Encounter: Payer: Self-pay | Admitting: *Deleted

## 2014-05-27 ENCOUNTER — Ambulatory Visit (INDEPENDENT_AMBULATORY_CARE_PROVIDER_SITE_OTHER): Payer: Commercial Managed Care - HMO | Admitting: Cardiology

## 2014-05-27 ENCOUNTER — Encounter: Payer: Self-pay | Admitting: Cardiology

## 2014-05-27 VITALS — BP 100/64 | HR 63 | Ht 69.5 in | Wt 158.0 lb

## 2014-05-27 DIAGNOSIS — I10 Essential (primary) hypertension: Secondary | ICD-10-CM | POA: Diagnosis not present

## 2014-05-27 DIAGNOSIS — I480 Paroxysmal atrial fibrillation: Secondary | ICD-10-CM

## 2014-05-27 DIAGNOSIS — I4891 Unspecified atrial fibrillation: Secondary | ICD-10-CM | POA: Diagnosis not present

## 2014-05-27 DIAGNOSIS — R9389 Abnormal findings on diagnostic imaging of other specified body structures: Secondary | ICD-10-CM | POA: Diagnosis not present

## 2014-05-27 DIAGNOSIS — R0989 Other specified symptoms and signs involving the circulatory and respiratory systems: Secondary | ICD-10-CM | POA: Diagnosis not present

## 2014-05-27 DIAGNOSIS — R931 Abnormal findings on diagnostic imaging of heart and coronary circulation: Secondary | ICD-10-CM

## 2014-05-27 NOTE — Progress Notes (Signed)
HPI The patient presents for followup of atrial fibrillation. He was hospitalized in May because of this.  He had atrial fibrillation with rapid rate and with rate controlled. I have reviewed the hospital records.  He was sent home on warfarin after evaluation as described below. Of note he did have a mild troponin elevation which was thought to be related to demand ischemia. He did have a slightly reduced ejection fraction of 45% with some regional wall motion abnormalities. However, this was managed medically.  He presents as a new patient for me though he is established with our practice. He notices his heart rhythm slightly. However, this doesn't bother him. He doesn't have any presyncope or syncope. He has some mild balance he feels that he doesn't fall. He is tolerating warfarin. He denies any bleeding issues. He is still actively following with GI and he does have some lower abdominal discomfort when he is moving around. However, he's not describing any constipation or diarrhea. He's not describing any chest pressure, neck or arm discomfort. He has no acute shortness of breath, PND or orthopnea. He is unfortunately still smoking.  Allergies  Allergen Reactions  . Wellbutrin [Bupropion] Anxiety    Current Outpatient Prescriptions  Medication Sig Dispense Refill  . clonazePAM (KLONOPIN) 0.5 MG tablet Take 1 tablet (0.5 mg total) by mouth daily.  30 tablet  3  . digoxin (LANOXIN) 0.25 MG tablet Take 0.5 tablets (0.125 mg total) by mouth daily.  30 tablet  5  . diltiazem (CARDIZEM CD) 300 MG 24 hr capsule Take 1 capsule (300 mg total) by mouth daily.  30 capsule  5  . DULoxetine (CYMBALTA) 60 MG capsule Take 1 capsule (60 mg total) by mouth daily.  30 capsule  5  . fish oil-omega-3 fatty acids 1000 MG capsule Take 2 g by mouth 2 (two) times daily.       . metoprolol tartrate (LOPRESSOR) 25 MG tablet Take 1 tablet (25 mg total) by mouth 2 (two) times daily.  60 tablet  5  . Multiple Vitamin  (MULTIVITAMIN WITH MINERALS) TABS Take 1 tablet by mouth daily.      . nitroGLYCERIN (NITROSTAT) 0.4 MG SL tablet Place 0.4 mg under the tongue every 5 (five) minutes as needed for chest pain.      . pantoprazole (PROTONIX) 40 MG tablet Take 1 tablet (40 mg total) by mouth daily.  30 tablet  5  . Polyethyl Glycol-Propyl Glycol (SYSTANE OP) Place 1 drop into both eyes 2 (two) times daily as needed (dry eyes).       . polyethylene glycol (MIRALAX / GLYCOLAX) packet Take 17 g by mouth 2 (two) times daily.  14 each  0  . pravastatin (PRAVACHOL) 80 MG tablet       . terazosin (HYTRIN) 2 MG capsule Take 4 mg by mouth at bedtime.      [name] Umeclidinium-Vilanterol (ANORO ELLIPTA) 62.5-25 MCG/INH AEPB Inhale 1 puff into the lungs daily.      [name] warfarin (COUMADIN) 4 MG tablet Take 1 tablet (4 mg total) by mouth daily at 6 PM.  30 tablet  5  . albuterol (PROVENTIL HFA;VENTOLIN HFA) 108 (90 BASE) MCG/ACT inhaler Inhale 2 puffs into the lungs every 6 (six) hours as needed for wheezing or shortness of breath.  1 Inhaler  2  . beta carotene w/minerals (OCUVITE) tablet Take 1 tablet by mouth daily.      . diclofenac sodium (VOLTAREN) 1 % GEL Apply 2  g topically 4 (four) times daily.  1 Tube  5  . DiphenhydrAMINE HCl, Sleep, (SLEEP-AID MAXIMUM STRENGTH) 50 MG CAPS Take 1 capsule by mouth at bedtime.       No current facility-administered medications for this visit.    Past Medical History  Diagnosis Date  . Hyperlipidemia   . Hypertension   . COPD (chronic obstructive pulmonary disease)   . Prostate cancer   . Tubular adenoma of colon 07/31/02, 11/18/03  . Atrial fibrillation   . Noncompliance with medications 04/2013    xarelto, digoxin  . Tobacco abuse     ongoing  . Stage III chronic kidney disease 04/03/2014  . Acute bronchitis 04/03/2014  . Atrial fibrillation with RVR 04/03/2014    Past Surgical History  Procedure Laterality Date  . 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 Anal abcess,hemorroids,      . Esophagogastroduodenoscopy  11/18/2003    Dr. Earle Gell  . Colonoscopy w/ biopsies  11/18/2003    Dr. Earle Gell  . Tonsillectomy    . Colonoscopy N/A 04/05/2014    Procedure: COLONOSCOPY;  Surgeon: Ladene Artist, MD;  Location: [facility] ENDOSCOPY;  Service: Endoscopy;  Laterality: N/A;  . Esophagogastroduodenoscopy N/A 04/05/2014    Procedure: ESOPHAGOGASTRODUODENOSCOPY (EGD);  Surgeon: Ladene Artist, MD;  Location: [facility];  Service: Endoscopy;  Laterality: N/A;    ROS:  As stated in the HPI and negative for all other systems.  PHYSICAL EXAM BP 100/64  Pulse 63  Ht 5' 9.5" (1.765 m)  Wt 158 lb (71.668 kg)  BMI 23.01 kg/m2 GENERAL:  Well appearing HEENT:  Pupils equal round and reactive, fundi not visualized, oral mucosa unremarkable, edentulous NECK:  No jugular venous distention, waveform within normal limits, carotid upstroke brisk and symmetric, no bruits, no thyromegaly LYMPHATICS:  No cervical, inguinal adenopathy LUNGS:  Clear to auscultation bilaterally BACK:  No CVA tenderness CHEST:  Unremarkable HEART:  PMI not displaced or sustained,S1 and S2 within normal limits, no S3,  no clicks, no rubs, no murmurs, irregular ABD:  Flat, positive bowel sounds normal in frequency in pitch, no bruits, no rebound, no guarding, questionable midline pulsatile mass, no hepatomegaly, no splenomegaly, mild abdominal tenderness EXT:  2 plus pulses upper and right lower ext mildly decreased DP/PT left leg, no edema, no cyanosis no clubbing SKIN:  No rashes no nodules NEURO:  Cranial nerves II through XII grossly intact, motor grossly intact throughout PSYCH:  Cognitively intact, oriented to person place and time  EKG:  Atrial fibrillation, rate 72, axis within normal limits, intervals within normal limits, no acute ST-T wave changes.  05/27/2014 This  ASSESSMENT AND PLAN  Atrial fibrillation with RVR /  He is at high risk for thromboembolic events. He will continue  anticoagulation. After the next appt I will order a  24-hour Holter on evaluation of rate control. We had a long discussion about the risk benefits of anticoagulation.  Elevated troponin / demand ischemia/abnormal echo He will need Lexiscan Myoview.  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  He would not be able to walk on a treadmill.   Abdominal pain Given this questionable pulsatile aorta with his ongoing tobacco abuse screening with an abdominal ultrasound for aneurysm is indicated.  Hyperlipidemia  I will defer follow up to [facility]  Hypertension  The blood pressure is at target. No change in medications is indicated. We will continue with therapeutic lifestyle changes (TLC).  Tobacco abuse  We discussed the need to stop smoking.   Heme  positive stool  He is having active GI follow up.  He will continue with warfarin.   Stage III CKD (chronic kidney disease)  His creat is stabel at 1.65 at the last check.     (Greater than 40 minutes reviewing all data with greater than 50% face to face with the patient).

## 2014-05-27 NOTE — Patient Instructions (Signed)
The current medical regimen is effective;  continue present plan and medications.  Your physician has requested that you have a lexiscan myoview. For further information please visit [email or web address]. Please follow instruction sheet, as given.  Your physician has requested that you have an abdominal aorta duplex. During this test, an ultrasound is used to evaluate the aorta. Allow 30 minutes for this exam. Do not eat after midnight the day before and avoid carbonated beverages  Follow up in 2 months with Dr Percival Spanish.

## 2014-06-01 ENCOUNTER — Ambulatory Visit (INDEPENDENT_AMBULATORY_CARE_PROVIDER_SITE_OTHER): Payer: Medicare HMO | Admitting: Pharmacist

## 2014-06-01 ENCOUNTER — Encounter: Payer: Self-pay | Admitting: Pharmacist

## 2014-06-01 DIAGNOSIS — I480 Paroxysmal atrial fibrillation: Secondary | ICD-10-CM

## 2014-06-01 DIAGNOSIS — I4891 Unspecified atrial fibrillation: Secondary | ICD-10-CM

## 2014-06-01 LAB — POCT INR: INR: 1.8

## 2014-06-01 NOTE — Progress Notes (Signed)
Patient is here today for protime / INR and to fill medication container.   He had 4 weeks of containers but he did not bring all his prepackaged meds or [facility].  I was able to fill 1 week's worth of medcation for him  INR today was 1.8  Assessment: Slightly subtherapeutic anticoagulation but improved from 1.2 to 1.8 from last weeks INR - goal 2.0 to 3.0 Medication Management  Plan: Continue warfarin 4mg  1 tablet daily Filled pill boxes for 1 week  Patient will RTC 1 week with all medicaitons!  Cherre Robins, PharmD, CPP

## 2014-06-01 NOTE — Patient Instructions (Signed)
Anticoagulation Dose Instructions as of 06/01/2014     [name] Tue Wed Thu Fri Sat   New Dose 4 mg 4 mg 4 mg 4 mg 4 mg 4 mg 4 mg    Description       Continue warfarin 4mg  take 1 tablet daily (INR increased from 1.2 to 1.8)      INR was 1.8 today

## 2014-06-05 ENCOUNTER — Telehealth: Payer: Self-pay | Admitting: Family Medicine

## 2014-06-05 NOTE — Telephone Encounter (Signed)
Spoke with patient and he wanted to know if psa of 0.13 was normal. Patient aware that the number is within normal limits and patient is going to bring copy with him so we can verify this

## 2014-06-08 ENCOUNTER — Ambulatory Visit (INDEPENDENT_AMBULATORY_CARE_PROVIDER_SITE_OTHER): Payer: Medicare HMO | Admitting: Pharmacist

## 2014-06-08 DIAGNOSIS — F411 Generalized anxiety disorder: Secondary | ICD-10-CM

## 2014-06-08 DIAGNOSIS — I480 Paroxysmal atrial fibrillation: Secondary | ICD-10-CM

## 2014-06-08 DIAGNOSIS — I4891 Unspecified atrial fibrillation: Secondary | ICD-10-CM

## 2014-06-08 LAB — POCT INR: INR: 1.7

## 2014-06-08 MED ORDER — DIGOXIN 250 MCG PO TABS: 0.1250 mg | ORAL_TABLET | Freq: Every day | ORAL | Status: DC

## 2014-06-08 MED ORDER — ATORVASTATIN CALCIUM 20 MG PO TABS: 20.0000 mg | ORAL_TABLET | Freq: Every day | ORAL | Status: DC

## 2014-06-08 MED ORDER — WARFARIN SODIUM 4 MG PO TABS: ORAL_TABLET | ORAL | Status: DC

## 2014-06-08 MED ORDER — DULOXETINE HCL 60 MG PO CPEP: 60.0000 mg | ORAL_CAPSULE | Freq: Every day | ORAL | Status: DC

## 2014-06-08 NOTE — Progress Notes (Signed)
Patient is here today for protime / INR and to fill medication container.   He has 4 weeks of containers but all medication to fill boxes is not brought in.  I had enough to do 1 week.  Patient does not have Rx's at [facility] for pravachol, warfarin, digoxin, duloxetine or clonazepam.  [facility]s [facility] in [facility].  [facility] he will have to bring in office note will all current medications and allow his PCP at the [place] to approved them.  [facility] not [facility] with [facility] but he is planning to get ASAP.   INR today was 1.7  Assessment: Slightly subtherapeutic anticoagulation Medication Management  Plan: Increase warfarin 4mg  tablets  to 1 and 1/2 on mondays and 1 tablet all other days  Filled pill boxes for 1 week - will RTC in 1 week Rx's sent for above mentioned meds for 2 months until can get from [place] Patient will RTC 1 week with all medicaitons!  Cherre Robins, PharmD, CPP

## 2014-06-11 ENCOUNTER — Encounter (HOSPITAL_COMMUNITY): Payer: Commercial Managed Care - HMO

## 2014-06-15 ENCOUNTER — Encounter: Payer: Self-pay | Admitting: Pharmacist

## 2014-06-15 ENCOUNTER — Ambulatory Visit (INDEPENDENT_AMBULATORY_CARE_PROVIDER_SITE_OTHER): Payer: Medicare HMO | Admitting: Pharmacist

## 2014-06-15 DIAGNOSIS — I4891 Unspecified atrial fibrillation: Secondary | ICD-10-CM

## 2014-06-15 DIAGNOSIS — I480 Paroxysmal atrial fibrillation: Secondary | ICD-10-CM

## 2014-06-15 LAB — POCT INR: INR: 1.6

## 2014-06-15 NOTE — Progress Notes (Signed)
See anticoagulation notes.  Protime checked today and medication boxes filled for 4 weeks.

## 2014-06-15 NOTE — Patient Instructions (Signed)
Anticoagulation Dose Instructions as of 06/15/2014     [name] Tue Wed Thu Fri Sat   New Dose 4 mg 6 mg 4 mg 4 mg 4 mg 6 mg 4 mg    Description       Increase warfarin 4mg  take 1 tablet daily except on mondays  And fridays take 1 and 1/2 tablets      INR was 1.6 today

## 2014-06-16 ENCOUNTER — Ambulatory Visit (INDEPENDENT_AMBULATORY_CARE_PROVIDER_SITE_OTHER): Payer: Commercial Managed Care - HMO | Admitting: Family Medicine

## 2014-06-16 VITALS — BP 127/72 | HR 59 | Temp 96.9°F | Ht 67.5 in | Wt 163.4 lb

## 2014-06-16 DIAGNOSIS — Z720 Tobacco use: Secondary | ICD-10-CM

## 2014-06-16 DIAGNOSIS — J449 Chronic obstructive pulmonary disease, unspecified: Secondary | ICD-10-CM

## 2014-06-16 DIAGNOSIS — I10 Essential (primary) hypertension: Secondary | ICD-10-CM

## 2014-06-16 DIAGNOSIS — N183 Chronic kidney disease, stage 3 unspecified: Secondary | ICD-10-CM

## 2014-06-16 DIAGNOSIS — F17200 Nicotine dependence, unspecified, uncomplicated: Secondary | ICD-10-CM

## 2014-06-16 NOTE — Progress Notes (Signed)
   Subjective:    Patient ID: Don [name], male    DOB: [birth year], 73 y.o.   MRN: [id number]  HPI This 73 y.o. male presents for evaluation of routine follow up.  He has hx of atrial fibrillation on coumadin therapy and gets inr checked by coumadin clinic.  [facility] and [facility]' s from [facility]. He has no acute medical problems.   Review of Systems No chest pain, SOB, HA, dizziness, vision change, N/V, diarrhea, constipation, dysuria, urinary urgency or frequency, myalgias, arthralgias or rash.     Objective:   Physical Exam  Vital signs noted  Elderly chronically ill appearing male in NAD  HEENT - Head atraumatic Normocephalic                Eyes - PERRLA, Conjuctiva - clear Sclera- Clear EOMI                Ears - EAC's Wnl TM's Wnl Gross Hearing WNL                 Throat - oropharanx wnl Respiratory - Lungs CTA bilateral Cardiac - RRR S1 and S2 without murmur GI - Abdomen soft Nontender and bowel sounds active x 4 Extremities - No edema. Neuro - Grossly intact.      Assessment & Plan:  Essential hypertension - Controlled and continue current regimen.  Chronic obstructive pulmonary disease, unspecified COPD, unspecified chronic bronchitis type  Stage III chronic kidney disease  Tobacco abuse - Discussed he needs to quit  [facility] for [facility] and [facility]  Lysbeth Penner FNP

## 2014-06-29 ENCOUNTER — Telehealth: Payer: Self-pay | Admitting: Family Medicine

## 2014-06-30 NOTE — Telephone Encounter (Signed)
Please call has question about his medicine. Wants to talk to tammy

## 2014-07-02 NOTE — Telephone Encounter (Signed)
Tried to call patient to discussed - no answer.  LM on VM

## 2014-07-03 NOTE — Telephone Encounter (Signed)
Patient thinks that medication through [place] is more expensive than when he gets at local  Pharmacy.  Brand $24/90 days and generic$8/90 days.  Usually through [place] is cheaper but I dont' think patient is taking into account that he is gettting 90 days supply from [place].  I will request printout of medications from [facility] and patient to bring all paperwork from [place] and all medications to appt 07/13/14.  Will try to compare coverage and help patient figure out most economical option.

## 2014-07-13 ENCOUNTER — Ambulatory Visit (INDEPENDENT_AMBULATORY_CARE_PROVIDER_SITE_OTHER): Payer: Medicare HMO | Admitting: Pharmacist

## 2014-07-13 ENCOUNTER — Encounter: Payer: Self-pay | Admitting: Pharmacist

## 2014-07-13 VITALS — BP 122/60 | HR 62 | Ht 67.5 in | Wt 172.0 lb

## 2014-07-13 DIAGNOSIS — I10 Essential (primary) hypertension: Secondary | ICD-10-CM

## 2014-07-13 DIAGNOSIS — I4891 Unspecified atrial fibrillation: Secondary | ICD-10-CM

## 2014-07-13 DIAGNOSIS — I480 Paroxysmal atrial fibrillation: Secondary | ICD-10-CM

## 2014-07-13 DIAGNOSIS — Z79899 Other long term (current) drug therapy: Secondary | ICD-10-CM

## 2014-07-13 DIAGNOSIS — Z0000 Encounter for general adult medical examination without abnormal findings: Secondary | ICD-10-CM

## 2014-07-13 DIAGNOSIS — I482 Chronic atrial fibrillation, unspecified: Secondary | ICD-10-CM

## 2014-07-13 DIAGNOSIS — K219 Gastro-esophageal reflux disease without esophagitis: Secondary | ICD-10-CM

## 2014-07-13 DIAGNOSIS — F411 Generalized anxiety disorder: Secondary | ICD-10-CM

## 2014-07-13 LAB — POCT CBC
Granulocyte percent: 75.9 %G (ref 37–80)
HCT, POC: 42.3 % — AB (ref 43.5–53.7)
HEMOGLOBIN: 13.8 g/dL — AB (ref 14.1–18.1)
Lymph, poc: 1.8 (ref 0.6–3.4)
MCH, POC: 31.2 pg (ref 27–31.2)
MCHC: 32.6 g/dL (ref 31.8–35.4)
MCV: 95.7 fL (ref 80–97)
MPV: 7.8 fL (ref 0–99.8)
POC GRANULOCYTE: 6.6 (ref 2–6.9)
POC LYMPH PERCENT: 20.6 %L (ref 10–50)
Platelet Count, POC: 184 10*3/uL (ref 142–424)
RBC: 4.4 M/uL — AB (ref 4.69–6.13)
RDW, POC: 14.6 %
WBC: 8.7 10*3/uL (ref 4.6–10.2)

## 2014-07-13 LAB — POCT INR: INR: 3.9

## 2014-07-13 MED ORDER — PANTOPRAZOLE SODIUM 40 MG PO TBEC: 40.0000 mg | DELAYED_RELEASE_TABLET | Freq: Every day | ORAL | Status: DC

## 2014-07-13 MED ORDER — CLONAZEPAM 0.5 MG PO TABS: 0.5000 mg | ORAL_TABLET | Freq: Every day | ORAL | Status: DC

## 2014-07-13 MED ORDER — METOPROLOL TARTRATE 25 MG PO TABS: 25.0000 mg | ORAL_TABLET | Freq: Two times a day (BID) | ORAL | Status: DC

## 2014-07-13 MED ORDER — TERAZOSIN HCL 2 MG PO CAPS: 2.0000 mg | ORAL_CAPSULE | Freq: Every day | ORAL | Status: DC

## 2014-07-13 MED ORDER — DIGOXIN 125 MCG PO TABS: 125.0000 ug | ORAL_TABLET | Freq: Every day | ORAL | Status: DC

## 2014-07-13 MED ORDER — ATORVASTATIN CALCIUM 20 MG PO TABS
20.0000 mg | ORAL_TABLET | Freq: Every day | ORAL | Status: DC
Start: 2014-07-13 — End: 2014-09-07

## 2014-07-13 MED ORDER — DILTIAZEM HCL ER COATED BEADS 300 MG PO CP24: 300.0000 mg | ORAL_CAPSULE | Freq: Every day | ORAL | Status: DC

## 2014-07-13 MED ORDER — OMEGA-3-ACID ETHYL ESTERS 1 G PO CAPS: 2.0000 g | ORAL_CAPSULE | Freq: Two times a day (BID) | ORAL | Status: DC

## 2014-07-13 MED ORDER — DULOXETINE HCL 60 MG PO CPEP: 60.0000 mg | ORAL_CAPSULE | Freq: Every day | ORAL | Status: DC

## 2014-07-13 NOTE — Patient Instructions (Addendum)
Anticoagulation Dose Instructions as of 07/13/2014     [name] Tue Wed Thu Fri Sat   New Dose 4 mg 4 mg 4 mg 4 mg 4 mg 6 mg 4 mg    Description       No warfarin for 1 day, then decrease warfarin $[id number]$  take 1 tablet daily except on fridays take 1 and 1/2 tablets.      INR was 3.9 today   Prescriptions have been sent to [facility] - they are going to start putting your medications in packaging and will deliver to you once a week - will deliver this Friday, September 4th.    Preventive Care for Adults A healthy lifestyle and preventive care can promote health and wellness. Preventive health guidelines for men include the following key practices:  A routine yearly physical is a good way to check with your health care provider about your health and preventative screening. It is a chance to share any concerns and updates on your health and to receive a thorough exam.  Visit your dentist for a routine exam and preventative care every 6 months. Brush your teeth twice a day and floss once a day. Good oral hygiene prevents tooth decay and gum disease.  The frequency of eye exams is based on your age, health, family medical history, use of contact lenses, and other factors. Follow your health care provider's recommendations for frequency of eye exams.  Eat a healthy diet. Foods such as vegetables, fruits, whole grains, low-fat dairy products, and lean protein foods contain the nutrients you need without too many calories. Decrease your intake of foods high in solid fats, added sugars, and salt. Eat the right amount of calories for you.Get information about a proper diet from your health care provider, if necessary.  Regular physical exercise is one of the most important things you can do for your health. Most adults should get at least 150 minutes of moderate-intensity exercise (any activity that increases your heart rate and causes you to sweat) each week. In addition, most adults need  muscle-strengthening exercises on 2 or more days a week.  Maintain a healthy weight. The body mass index (BMI) is a screening tool to identify possible weight problems. It provides an estimate of body fat based on height and weight. Your health care provider can find your BMI and can help you achieve or maintain a healthy weight.For adults 20 years and older:  A BMI below 18.5 is considered underweight.  A BMI of 18.5 to 24.9 is normal.  A BMI of 25 to 29.9 is considered overweight.  A BMI of 30 and above is considered obese.  Maintain normal blood lipids and cholesterol levels by exercising and minimizing your intake of saturated fat. Eat a balanced diet with plenty of fruit and vegetables. Blood tests for lipids and cholesterol should begin at age 76 and be repeated every 5 years. If your lipid or cholesterol levels are high, you are over 50, or you are at high risk for heart disease, you may need your cholesterol levels checked more frequently.Ongoing high lipid and cholesterol levels should be treated with medicines if diet and exercise are not working.  If you smoke, find out from your health care provider how to quit. If you do not use tobacco, do not start.  Lung cancer screening is recommended for adults aged 47-80 years who are at high risk for developing lung cancer because of a history of smoking. A yearly low-dose CT scan  of the lungs is recommended for people who have at least a 30-pack-year history of smoking and are a current smoker or have quit within the past 15 years. A pack year of smoking is smoking an average of 1 pack of cigarettes a day for 1 year (for example: 1 pack a day for 30 years or 2 packs a day for 15 years). Yearly screening should continue until the smoker has stopped smoking for at least 15 years. Yearly screening should be stopped for people who develop a health problem that would prevent them from having lung cancer treatment.  If you choose to drink alcohol,  do not have more than 2 drinks per day. One drink is considered to be 12 ounces (355 mL) of beer, 5 ounces (148 mL) of wine, or 1.5 ounces (44 mL) of liquor.  Avoid use of street drugs. Do not share needles with anyone. Ask for help if you need support or instructions about stopping the use of drugs.  High blood pressure causes heart disease and increases the risk of stroke. Your blood pressure should be checked at least every 1-2 years. Ongoing high blood pressure should be treated with medicines, if weight loss and exercise are not effective.  If you are 30-64 years old, ask your health care provider if you should take aspirin to prevent heart disease.  Diabetes screening involves taking a blood sample to check your fasting blood sugar level. This should be done once every 3 years, after age 15, if you are within normal weight and without risk factors for diabetes. Testing should be considered at a younger age or be carried out more frequently if you are overweight and have at least 1 risk factor for diabetes.  Colorectal cancer can be detected and often prevented. Most routine colorectal cancer screening begins at the age of 86 and continues through age 8. However, your health care provider may recommend screening at an earlier age if you have risk factors for colon cancer. On a yearly basis, your health care provider may provide home test kits to check for hidden blood in the stool. Use of a small camera at the end of a tube to directly examine the colon (sigmoidoscopy or colonoscopy) can detect the earliest forms of colorectal cancer. Talk to your health care provider about this at age 73, when routine screening begins. Direct exam of the colon should be repeated every 5-10 years through age 4, unless early forms of precancerous polyps or small growths are found.  People who are at an increased risk for hepatitis B should be screened for this virus. You are considered at high risk for hepatitis B  if:  You were born in a country where hepatitis B occurs often. Talk with your health care provider about which countries are considered high risk.  Your parents were born in a high-risk country and you have not received a shot to protect against hepatitis B (hepatitis B vaccine).  You have HIV or AIDS.  You use needles to inject street drugs.  You live with, or have sex with, someone who has hepatitis B.  You are a man who has sex with other men (MSM).  You get hemodialysis treatment.  You take certain medicines for conditions such as cancer, organ transplantation, and autoimmune conditions.  Hepatitis C blood testing is recommended for all people born from [birth year] through 1965 and any individual with known risks for hepatitis C.  Practice safe sex. Use condoms and avoid high-risk sexual  practices to reduce the spread of sexually transmitted infections (STIs). STIs include gonorrhea, chlamydia, syphilis, trichomonas, herpes, HPV, and human immunodeficiency virus (HIV). Herpes, HIV, and HPV are viral illnesses that have no cure. They can result in disability, cancer, and death.  If you are at risk of being infected with HIV, it is recommended that you take a prescription medicine daily to prevent HIV infection. This is called preexposure prophylaxis (PrEP). You are considered at risk if:  You are a man who has sex with other men (MSM) and have other risk factors.  You are a heterosexual man, are sexually active, and are at increased risk for HIV infection.  You take drugs by injection.  You are sexually active with a partner who has HIV.  Talk with your health care provider about whether you are at high risk of being infected with HIV. If you choose to begin PrEP, you should first be tested for HIV. You should then be tested every 3 months for as long as you are taking PrEP.  A one-time screening for abdominal aortic aneurysm (AAA) and surgical repair of large AAAs by ultrasound are  recommended for men ages 12 to 59 years who are current or former smokers.  Healthy men should no longer receive prostate-specific antigen (PSA) blood tests as part of routine cancer screening. Talk with your health care provider about prostate cancer screening.  Testicular cancer screening is not recommended for adult males who have no symptoms. Screening includes self-exam, a health care provider exam, and other screening tests. Consult with your health care provider about any symptoms you have or any concerns you have about testicular cancer.  Use sunscreen. Apply sunscreen liberally and repeatedly throughout the day. You should seek shade when your shadow is shorter than you. Protect yourself by wearing long sleeves, pants, a wide-brimmed hat, and sunglasses year round, whenever you are outdoors.  Once a month, do a whole-body skin exam, using a mirror to look at the skin on your back. Tell your health care provider about new moles, moles that have irregular borders, moles that are larger than a pencil eraser, or moles that have changed in shape or color.  Stay current with required vaccines (immunizations).  Influenza vaccine. All adults should be immunized every year.  Tetanus, diphtheria, and acellular pertussis (Td, Tdap) vaccine. An adult who has not previously received Tdap or who does not know his vaccine status should receive 1 dose of Tdap. This initial dose should be followed by tetanus and diphtheria toxoids (Td) booster doses every 10 years. Adults with an unknown or incomplete history of completing a 3-dose immunization series with Td-containing vaccines should begin or complete a primary immunization series including a Tdap dose. Adults should receive a Td booster every 10 years.  Varicella vaccine. An adult without evidence of immunity to varicella should receive 2 doses or a second dose if he has previously received 1 dose.  Human papillomavirus (HPV) vaccine. Males aged 56-21  years who have not received the vaccine previously should receive the 3-dose series. Males aged 22-26 years may be immunized. Immunization is recommended through the age of 67 years for any male who has sex with males and did not get any or all doses earlier. Immunization is recommended for any person with an immunocompromised condition through the age of 53 years if he did not get any or all doses earlier. During the 3-dose series, the second dose should be obtained 4-8 weeks after the first dose. The  third dose should be obtained 24 weeks after the first dose and 16 weeks after the second dose.  Zoster vaccine. One dose is recommended for adults aged 62 years or older unless certain conditions are present.  Measles, mumps, and rubella (MMR) vaccine. Adults born before [birth year] generally are considered immune to measles and mumps. Adults born in [birth year] or later should have 1 or more doses of MMR vaccine unless there is a contraindication to the vaccine or there is laboratory evidence of immunity to each of the three diseases. A routine second dose of MMR vaccine should be obtained at least 28 days after the first dose for students attending postsecondary schools, health care workers, or international travelers. People who received inactivated measles vaccine or an unknown type of measles vaccine during 1963-1967 should receive 2 doses of MMR vaccine. People who received inactivated mumps vaccine or an unknown type of mumps vaccine before 1979 and are at high risk for mumps infection should consider immunization with 2 doses of MMR vaccine. Unvaccinated health care workers born before [birth year] who lack laboratory evidence of measles, mumps, or rubella immunity or laboratory confirmation of disease should consider measles and mumps immunization with 2 doses of MMR vaccine or rubella immunization with 1 dose of MMR vaccine.  Pneumococcal 13-valent conjugate (PCV13) vaccine. When indicated, a person who is uncertain of his  immunization history and has no record of immunization should receive the PCV13 vaccine. An adult aged 50 years or older who has certain medical conditions and has not been previously immunized should receive 1 dose of PCV13 vaccine. This PCV13 should be followed with a dose of pneumococcal polysaccharide (PPSV23) vaccine. The PPSV23 vaccine dose should be obtained at least 8 weeks after the dose of PCV13 vaccine. An adult aged 64 years or older who has certain medical conditions and previously received 1 or more doses of PPSV23 vaccine should receive 1 dose of PCV13. The PCV13 vaccine dose should be obtained 1 or more years after the last PPSV23 vaccine dose.  Pneumococcal polysaccharide (PPSV23) vaccine. When PCV13 is also indicated, PCV13 should be obtained first. All adults aged 4 years and older should be immunized. An adult younger than age 55 years who has certain medical conditions should be immunized. Any person who resides in a nursing home or long-term care facility should be immunized. An adult smoker should be immunized. People with an immunocompromised condition and certain other conditions should receive both PCV13 and PPSV23 vaccines. People with human immunodeficiency virus (HIV) infection should be immunized as soon as possible after diagnosis. Immunization during chemotherapy or radiation therapy should be avoided. Routine use of PPSV23 vaccine is not recommended for American Indians, [address], or people younger than 65 years unless there are medical conditions that require PPSV23 vaccine. When indicated, people who have unknown immunization and have no record of immunization should receive PPSV23 vaccine. One-time revaccination 5 years after the first dose of PPSV23 is recommended for people aged 19-64 years who have chronic kidney failure, nephrotic syndrome, asplenia, or immunocompromised conditions. People who received 1-2 doses of PPSV23 before age 82 years should receive another  dose of PPSV23 vaccine at age 49 years or later if at least 5 years have passed since the previous dose. Doses of PPSV23 are not needed for people immunized with PPSV23 at or after age 26 years.  Meningococcal vaccine. Adults with asplenia or persistent complement component deficiencies should receive 2 doses of quadrivalent meningococcal conjugate (MenACWY-D) vaccine. The doses should be  obtained at least 2 months apart. Microbiologists working with certain meningococcal bacteria, [employer] recruits, people at risk during an outbreak, and people who travel to or live in countries with a high rate of meningitis should be immunized. A first-year college student up through age 65 years who is living in a residence hall should receive a dose if he did not receive a dose on or after his 16th birthday. Adults who have certain high-risk conditions should receive one or more doses of vaccine.  Hepatitis A vaccine. Adults who wish to be protected from this disease, have certain high-risk conditions, work with hepatitis A-infected animals, work in hepatitis A research labs, or travel to or work in countries with a high rate of hepatitis A should be immunized. Adults who were previously unvaccinated and who anticipate close contact with an international adoptee during the first 60 days after arrival in the [place] from a country with a high rate of hepatitis A should be immunized.  Hepatitis B vaccine. Adults should be immunized if they wish to be protected from this disease, have certain high-risk conditions, may be exposed to blood or other infectious body fluids, are household contacts or sex partners of hepatitis B positive people, are clients or workers in certain care facilities, or travel to or work in countries with a high rate of hepatitis B.  Haemophilus influenzae type b (Hib) vaccine. A previously unvaccinated person with asplenia or sickle cell disease or having a scheduled splenectomy should receive  1 dose of Hib vaccine. Regardless of previous immunization, a recipient of a hematopoietic stem cell transplant should receive a 3-dose series 6-12 months after his successful transplant. Hib vaccine is not recommended for adults with HIV infection. Preventive Service / Frequency Ages 29 to 13  Blood pressure check.** / Every 1 to 2 years.  Lipid and cholesterol check.** / Every 5 years beginning at age 3.  Hepatitis C blood test.** / For any individual with known risks for hepatitis C.  Skin self-exam. / Monthly.  Influenza vaccine. / Every year.  Tetanus, diphtheria, and acellular pertussis (Tdap, Td) vaccine.** / Consult your health care provider. 1 dose of Td every 10 years.  Varicella vaccine.** / Consult your health care provider.  HPV vaccine. / 3 doses over 6 months, if 79 or younger.  Measles, mumps, rubella (MMR) vaccine.** / You need at least 1 dose of MMR if you were born in 1957 or later. You may also need a second dose.  Pneumococcal 13-valent conjugate (PCV13) vaccine.** / Consult your health care provider.  Pneumococcal polysaccharide (PPSV23) vaccine.** / 1 to 2 doses if you smoke cigarettes or if you have certain conditions.  Meningococcal vaccine.** / 1 dose if you are age 30 to 53 years and a Market researcher living in a residence hall, or have one of several medical conditions. You may also need additional booster doses.  Hepatitis A vaccine.** / Consult your health care provider.  Hepatitis B vaccine.** / Consult your health care provider.  Haemophilus influenzae type b (Hib) vaccine.** / Consult your health care provider. Ages 3 to 7  Blood pressure check.** / Every 1 to 2 years.  Lipid and cholesterol check.** / Every 5 years beginning at age 4.  Lung cancer screening. / Every year if you are aged 36-80 years and have a 30-pack-year history of smoking and currently smoke or have quit within the past 15 years. Yearly screening is stopped  once you have quit smoking for at least  15 years or develop a health problem that would prevent you from having lung cancer treatment.  Fecal occult blood test (FOBT) of stool. / Every year beginning at age 12 and continuing until age 104. You may not have to do this test if you get a colonoscopy every 10 years.  Flexible sigmoidoscopy** or colonoscopy.** / Every 5 years for a flexible sigmoidoscopy or every 10 years for a colonoscopy beginning at age 74 and continuing until age 40.  Hepatitis C blood test.** / For all people born from [birth year] through 1965 and any individual with known risks for hepatitis C.  Skin self-exam. / Monthly.  Influenza vaccine. / Every year.  Tetanus, diphtheria, and acellular pertussis (Tdap/Td) vaccine.** / Consult your health care provider. 1 dose of Td every 10 years.  Varicella vaccine.** / Consult your health care provider.  Zoster vaccine.** / 1 dose for adults aged 53 years or older.  Measles, mumps, rubella (MMR) vaccine.** / You need at least 1 dose of MMR if you were born in 1959 or later. You may also need a second dose.  Pneumococcal 13-valent conjugate (PCV13) vaccine.** / Consult your health care provider.  Pneumococcal polysaccharide (PPSV23) vaccine.** / 1 to 2 doses if you smoke cigarettes or if you have certain conditions.  Meningococcal vaccine.** / Consult your health care provider.  Hepatitis A vaccine.** / Consult your health care provider.  Hepatitis B vaccine.** / Consult your health care provider.  Haemophilus influenzae type b (Hib) vaccine.** / Consult your health care provider. Ages 56 and over  Blood pressure check.** / Every 1 to 2 years.  Lipid and cholesterol check.**/ Every 5 years beginning at age 22.  Lung cancer screening. / Every year if you are aged 55-80 years and have a 30-pack-year history of smoking and currently smoke or have quit within the past 15 years. Yearly screening is stopped once you have quit smoking  for at least 15 years or develop a health problem that would prevent you from having lung cancer treatment.  Fecal occult blood test (FOBT) of stool. / Every year beginning at age 51 and continuing until age 69. You may not have to do this test if you get a colonoscopy every 10 years.  Flexible sigmoidoscopy** or colonoscopy.** / Every 5 years for a flexible sigmoidoscopy or every 10 years for a colonoscopy beginning at age 77 and continuing until age 76.  Hepatitis C blood test.** / For all people born from [birth year] through 1967 and any individual with known risks for hepatitis C.  Abdominal aortic aneurysm (AAA) screening.** / A one-time screening for ages 76 to 80 years who are current or former smokers.  Skin self-exam. / Monthly.  Influenza vaccine. / Every year.  Tetanus, diphtheria, and acellular pertussis (Tdap/Td) vaccine.** / 1 dose of Td every 10 years.  Varicella vaccine.** / Consult your health care provider.  Zoster vaccine.** / 1 dose for adults aged 23 years or older.  Pneumococcal 13-valent conjugate (PCV13) vaccine.** / Consult your health care provider.  Pneumococcal polysaccharide (PPSV23) vaccine.** / 1 dose for all adults aged 54 years and older.  Meningococcal vaccine.** / Consult your health care provider.  Hepatitis A vaccine.** / Consult your health care provider.  Hepatitis B vaccine.** / Consult your health care provider.  Haemophilus influenzae type b (Hib) vaccine.** / Consult your health care provider. **Family history and personal history of risk and conditions may change your health care provider's recommendations. Document Released: 12/26/2001 Document Revised: 11/04/2013 Document  Reviewed: 03/27/2011 [employer] Patient Information 2015 [place], [place]. This information is not intended to replace advice given to you by your health care provider. Make sure you discuss any questions you have with your health care provider.

## 2014-07-13 NOTE — Progress Notes (Signed)
Subjective:    Don [name] is a 73 y.o. male who presents for Medicare Initial Wellness Visit and recheck Protime  Preventive Screening-Counseling & Management  Tobacco History  Smoking status  . Current Every Day Smoker  . Types: Cigarettes, Cigars  Smokeless tobacco  . Never Used   Current Problems (verified) Patient Active Problem List   Diagnosis Date Noted  . Paroxysmal atrial fibrillation 05/25/2014  . Hiatal hernia 04/06/2014  . Other and unspecified hyperlipidemia 04/04/2014  . Personal history of colonic polyps 04/04/2014  . History of prostate cancer 04/03/2014  . Stage III chronic kidney disease 04/03/2014  . Tenosynovitis of thumb 10/16/2013  . Atrial fibrillation 05/19/2013  . Tobacco abuse 04/25/2013  . Hernia 02/08/2011  . Prostate cancer 02/08/2011  . COPD (chronic obstructive pulmonary disease) 02/08/2011  . Hypertension 02/08/2011    Medications Prior to Visit Current Outpatient Prescriptions on File Prior to Visit  Medication Sig Dispense Refill  . albuterol (PROVENTIL HFA;VENTOLIN HFA) 108 (90 BASE) MCG/ACT inhaler Inhale 2 puffs into the lungs every 6 (six) hours as needed for wheezing or shortness of breath.  1 Inhaler  2  . beta carotene w/minerals (OCUVITE) tablet Take 1 tablet by mouth daily.      . diclofenac sodium (VOLTAREN) 1 % GEL Apply 2 g topically 4 (four) times daily.  1 Tube  5  . DiphenhydrAMINE HCl, Sleep, (SLEEP-AID MAXIMUM STRENGTH) 50 MG CAPS Take 1 capsule by mouth at bedtime.      . fish oil-omega-3 fatty acids 1000 MG capsule Take 2 g by mouth 2 (two) times daily.       . Multiple Vitamin (MULTIVITAMIN WITH MINERALS) TABS Take 1 tablet by mouth daily.      . nitroGLYCERIN (NITROSTAT) 0.4 MG SL tablet Place 0.4 mg under the tongue every 5 (five) minutes as needed for chest pain.      Vladimir Faster Glycol-Propyl Glycol (SYSTANE OP) Place 1 drop into both eyes 2 (two) times daily as needed (dry eyes).       . polyethylene glycol  (MIRALAX / GLYCOLAX) packet Take 17 g by mouth 2 (two) times daily.  14 each  0  . Umeclidinium-Vilanterol (ANORO ELLIPTA) 62.5-25 MCG/INH AEPB Inhale 1 puff into the lungs daily.      [name] warfarin (COUMADIN) 4 MG tablet Take 1 to 1.5 tablets by mouth daily as directed by anticoagulation clinic  45 tablet  1   No current facility-administered medications on file prior to visit.    Current Medications (verified) Current Outpatient Prescriptions  Medication Sig Dispense Refill  . albuterol (PROVENTIL HFA;VENTOLIN HFA) 108 (90 BASE) MCG/ACT inhaler Inhale 2 puffs into the lungs every 6 (six) hours as needed for wheezing or shortness of breath.  1 Inhaler  2  . atorvastatin (LIPITOR) 20 MG tablet Take 1 tablet (20 mg total) by mouth daily.  30 tablet  1  . beta carotene w/minerals (OCUVITE) tablet Take 1 tablet by mouth daily.      . clonazePAM (KLONOPIN) 0.5 MG tablet Take 1 tablet (0.5 mg total) by mouth at bedtime.  30 tablet  1  . diclofenac sodium (VOLTAREN) 1 % GEL Apply 2 g topically 4 (four) times daily.  1 Tube  5  . digoxin (LANOXIN) 0.125 MG tablet Take 1 tablet (125 mcg total) by mouth daily.  30 tablet  1  . diltiazem (CARDIZEM CD) 300 MG 24 hr capsule Take 1 capsule (300 mg total) by mouth daily.  [place]  capsule  1  . DiphenhydrAMINE HCl, Sleep, (SLEEP-AID MAXIMUM STRENGTH) 50 MG CAPS Take 1 capsule by mouth at bedtime.      . DULoxetine (CYMBALTA) 60 MG capsule Take 1 capsule (60 mg total) by mouth daily.  30 capsule  1  . fish oil-omega-3 fatty acids 1000 MG capsule Take 2 g by mouth 2 (two) times daily.       . metoprolol tartrate (LOPRESSOR) 25 MG tablet Take 1 tablet (25 mg total) by mouth 2 (two) times daily.  60 tablet  1  . Multiple Vitamin (MULTIVITAMIN WITH MINERALS) TABS Take 1 tablet by mouth daily.      . nitroGLYCERIN (NITROSTAT) 0.4 MG SL tablet Place 0.4 mg under the tongue every 5 (five) minutes as needed for chest pain.      . pantoprazole (PROTONIX) 40 MG tablet Take 1  tablet (40 mg total) by mouth daily.  30 tablet  5  . Polyethyl Glycol-Propyl Glycol (SYSTANE OP) Place 1 drop into both eyes 2 (two) times daily as needed (dry eyes).       . polyethylene glycol (MIRALAX / GLYCOLAX) packet Take 17 g by mouth 2 (two) times daily.  14 each  0  . terazosin (HYTRIN) 2 MG capsule Take 1 capsule (2 mg total) by mouth at bedtime.  30 capsule  1  . Umeclidinium-Vilanterol (ANORO ELLIPTA) 62.5-25 MCG/INH AEPB Inhale 1 puff into the lungs daily.      [name] warfarin (COUMADIN) 4 MG tablet Take 1 to 1.5 tablets by mouth daily as directed by anticoagulation clinic  45 tablet  1   No current facility-administered medications for this visit.     Allergies (verified) Wellbutrin   PAST HISTORY  Family History Family History  Problem Relation Age of Onset  . Diabetes Father   . Heart disease Father     MI  . Heart attack Father   . Heart attack Mother   . Heart disease Brother   . Cancer Brother   . Lung cancer Brother   . Heart disease Brother   . Lung cancer Sister   . Cancer Sister     lung  . Heart disease Brother     Social History History  Substance Use Topics  . Smoking status: Current Every Day Smoker    Types: Cigarettes, Cigars  . Smokeless tobacco: Never Used  . Alcohol Use: No    Are there smokers in your home (other than you)?  No  Risk Factors Current exercise habits: The patient does not participate in regular exercise at present.  Dietary issues discussed: none   Cardiac risk factors: advanced age (older than 48 for men, 37 for women), dyslipidemia, family history of premature cardiovascular disease, hypertension, male gender, sedentary lifestyle and smoking/ tobacco exposure.  Depression Screen (Note: if answer to either of the following is "Yes", a more complete depression screening is indicated)   Q1: Over the past two weeks, have you felt down, depressed or hopeless? No  Q2: Over the past two weeks, have you felt little interest or  pleasure in doing things? No  Have you lost interest or pleasure in daily life? No  Do you often feel hopeless? No  Do you cry easily over simple problems? No  Activities of Daily Living In your present state of health, do you have any difficulty performing the following activities?:  Driving? No Managing money?  No - though patient does discuss having financial disagreements recently with his step children  Feeding yourself? No Getting from bed to chair? No Climbing a flight of stairs? No Preparing food and eating?: No Bathing or showering? No Getting dressed: No Getting to the toilet? No Using the toilet:No Moving around from place to place: No In the past year have you fallen or had a near fall?:No   Are you sexually active?  No  Do you have more than one partner?  No  Hearing Difficulties: No Do you often ask people to speak up or repeat themselves? No Do you experience ringing or noises in your ears? No Do you have difficulty understanding soft or whispered voices? No   Do you feel that you have a problem with memory? No  Do you often misplace items? Yes  Do you feel safe at home?  Yes  Cognitive Testing  Alert? Yes  Normal Appearance?Yes  Oriented to person? Yes  Place? Yes   Time? Yes  Recall of three objects?  Yes  Can perform simple calculations? Yes  Displays appropriate judgment?Yes  Can read the correct time from a watch face?Yes   Advanced Directives have been discussed with the patient? Yes   List the Names of Other Physician/Practitioners you currently use: 1.  Hocherin - Caridiologist 2.  Carlean Purl - GI  Indicate any recent Medical Services you may have received from other than [facility] providers in the past year (date may be approximate).   There is no immunization history on file for this patient.  Screening Tests Health Maintenance  Topic Date Due  . Tetanus/tdap  08/01/1958  . Zostavax  08/02/1999  . Pneumococcal Polysaccharide Vaccine Age 79 And  Over  08/01/2004  . Influenza Vaccine  06/13/2014  . Colonoscopy  04/06/2019    All answers were reviewed with the patient and necessary referrals were made:  Cherre Robins, [facility]   07/13/2014   History reviewed: allergies, current medications, past family history, past medical history, past social history, past surgical history and problem list  Review of Systems Ears, nose, mouth, throat, and face: negative except for dry mouth Cardiovascular: negative except for dizziness upon standing    Objective:  Blood pressure 122/60, pulse 62, height 5' 7.5" (1.715 m), weight 172 lb (78.019 kg). Body mass index is 26.53 kg/(m^2).  INR is 3.9 today   Assessment:   Initial Medicare Wellness Visit Therapeutic Anticoagulation Medication Adverse Effects - dry mouth and syncope related to terazosin     Plan:     During the course of the visit the patient was educated and counseled about appropriate screening and preventive services including:    Pneumococcal vaccine - patient thinks he received in hospital but I was unable to locate documentation.  Will see if our billing department can find out if pneumcoccal vaccine billed within last 12 months.  Influenza vaccine - plan to get next month  Td vaccine - refused  Screening electrocardiogram  Prostate cancer screening  Colorectal cancer screening - UTD  Diabetes screening  Glaucoma screening  Nutrition counseling   Smoking cessation counseling - discussed today - patient not ready to stop smoking  Advanced directives: [facility] Packet given  Decrease terazosin to 2mg  qhs.  Spent 20 minutes contacting pharmacy to start getting medications locally again instead of some at [place].  He will switch to [facility] because they will fill pill in card system weekly instead of monthly for him.   Check magnesium today - patient has bottle of magnesium supplement but I don't see on his medication list.  Also no BMET  since hospitalization - checked today.   Hold warfarin today and only 1/2 tomorrow then follow dose below: Anticoagulation Dose Instructions as of 07/13/2014     [name] Tue Wed Thu Fri Sat   New Dose 4 mg 4 mg 4 mg 4 mg 4 mg 6 mg 4 mg    Description       No warfarin for 1 day, then decrease warfarin 4mg  take 1 tablet daily except on fridays take 1 and 1/2 tablets.      RTC in 1-2 weeks to recheck INR.   Diet review for nutrition referral? Yes ____  Not Indicated __X__   Patient Instructions (the written plan) was given to the patient.  Medicare Attestation I have personally reviewed: [name] medical and social history Their use of alcohol, tobacco or illicit drugs Their current medications and supplements [name] functional ability including ADLs,fall risks, home safety risks, cognitive, and hearing and visual impairment Diet and physical activities Evidence for depression or mood disorders  [name] weight, height, BMI, and visual acuity have been recorded in the chart.  I have made referrals, counseling, and provided education to the patient based on review of the above and I have provided the patient with a written personalized care plan for preventive services.     Cherre Robins, [facility]   07/13/2014

## 2014-07-14 LAB — CMP14+EGFR
A/G RATIO: 1.9 (ref 1.1–2.5)
ALT: 9 IU/L (ref 0–44)
AST: 14 IU/L (ref 0–40)
Albumin: 3.6 g/dL (ref 3.5–4.8)
Alkaline Phosphatase: 100 IU/L (ref 39–117)
BUN/Creatinine Ratio: 13 (ref 10–22)
BUN: 25 mg/dL (ref 8–27)
CALCIUM: 8.9 mg/dL (ref 8.6–10.2)
CO2: 25 mmol/L (ref 18–29)
CREATININE: 1.89 mg/dL — AB (ref 0.76–1.27)
Chloride: 104 mmol/L (ref 97–108)
GFR calc Af Amer: 40 mL/min/{1.73_m2} — ABNORMAL LOW (ref >59)
GFR calc non Af Amer: 34 mL/min/{1.73_m2} — ABNORMAL LOW (ref >59)
Globulin, Total: 1.9 g/dL (ref 1.5–4.5)
Glucose: 102 mg/dL — ABNORMAL HIGH (ref 65–99)
Potassium: 4.6 mmol/L (ref 3.5–5.2)
Sodium: 143 mmol/L (ref 134–144)
TOTAL PROTEIN: 5.5 g/dL — AB (ref 6.0–8.5)
Total Bilirubin: 0.4 mg/dL (ref 0.0–1.2)

## 2014-07-14 LAB — MAGNESIUM: Magnesium: 1.8 mg/dL (ref 1.6–2.6)

## 2014-07-17 ENCOUNTER — Telehealth: Payer: Self-pay | Admitting: Pharmacist

## 2014-07-17 DIAGNOSIS — Z79899 Other long term (current) drug therapy: Secondary | ICD-10-CM

## 2014-07-17 NOTE — Telephone Encounter (Signed)
Patient aware of results.

## 2014-07-25 ENCOUNTER — Telehealth: Payer: Self-pay | Admitting: Family Medicine

## 2014-07-29 ENCOUNTER — Ambulatory Visit: Payer: Commercial Managed Care - HMO | Admitting: Cardiology

## 2014-08-03 ENCOUNTER — Ambulatory Visit (INDEPENDENT_AMBULATORY_CARE_PROVIDER_SITE_OTHER): Payer: Medicare HMO | Admitting: Pharmacist

## 2014-08-03 DIAGNOSIS — I4891 Unspecified atrial fibrillation: Secondary | ICD-10-CM

## 2014-08-03 DIAGNOSIS — I480 Paroxysmal atrial fibrillation: Secondary | ICD-10-CM

## 2014-08-03 DIAGNOSIS — Z79899 Other long term (current) drug therapy: Secondary | ICD-10-CM

## 2014-08-03 DIAGNOSIS — R5381 Other malaise: Secondary | ICD-10-CM

## 2014-08-03 DIAGNOSIS — R5383 Other fatigue: Secondary | ICD-10-CM

## 2014-08-03 LAB — POCT CBC
Granulocyte percent: 75.8 %G (ref 37–80)
HCT, POC: 41.6 % — AB (ref 43.5–53.7)
Hemoglobin: 14 g/dL — AB (ref 14.1–18.1)
LYMPH, POC: 1.6 (ref 0.6–3.4)
MCH: 32 pg — AB (ref 27–31.2)
MCHC: 33.7 g/dL (ref 31.8–35.4)
MCV: 95 fL (ref 80–97)
MPV: 7.4 fL (ref 0–99.8)
PLATELET COUNT, POC: 196 10*3/uL (ref 142–424)
POC Granulocyte: 5.5 (ref 2–6.9)
POC LYMPH PERCENT: 21.9 %L (ref 10–50)
RBC: 4.4 M/uL — AB (ref 4.69–6.13)
RDW, POC: 14.1 %
WBC: 7.3 10*3/uL (ref 4.6–10.2)

## 2014-08-03 LAB — POCT INR: INR: 2.4

## 2014-08-03 NOTE — Patient Instructions (Signed)
Anticoagulation Dose Instructions as of 08/03/2014     Don [name] Tue Wed Thu Fri Sat   New Dose 4 mg 4 mg 4 mg 4 mg 4 mg 6 mg 4 mg    Description       Continue warfarin 4mg  take 1 tablet daily except on fridays take 1 and 1/2 tablets.      INR wsa 2.4 today

## 2014-08-03 NOTE — Progress Notes (Signed)
Also checked digoxin level and CBC due to c/o fatigue.   HBG was slightly low but continues to improved compared to CBC over last 6 months.

## 2014-08-04 LAB — DIGOXIN LEVEL: DIGOXIN LVL: 1.7 ng/mL (ref 0.9–2.0)

## 2014-08-10 ENCOUNTER — Ambulatory Visit: Payer: Self-pay | Admitting: Family Medicine

## 2014-08-11 ENCOUNTER — Ambulatory Visit: Payer: Self-pay | Admitting: Family Medicine

## 2014-08-19 ENCOUNTER — Ambulatory Visit (INDEPENDENT_AMBULATORY_CARE_PROVIDER_SITE_OTHER): Payer: Medicare HMO | Admitting: Family Medicine

## 2014-08-19 ENCOUNTER — Encounter: Payer: Self-pay | Admitting: Family Medicine

## 2014-08-19 VITALS — BP 164/77 | HR 45 | Temp 97.5°F | Ht 67.5 in | Wt 168.0 lb

## 2014-08-19 DIAGNOSIS — J449 Chronic obstructive pulmonary disease, unspecified: Secondary | ICD-10-CM

## 2014-08-19 DIAGNOSIS — I480 Paroxysmal atrial fibrillation: Secondary | ICD-10-CM

## 2014-08-19 DIAGNOSIS — I10 Essential (primary) hypertension: Secondary | ICD-10-CM

## 2014-08-19 NOTE — Progress Notes (Signed)
   Subjective:    Patient ID: Don [name], male    DOB: [birth year], 73 y.o.   MRN: [id number]  HPI  73 year old gentleman here to recheck his foot. He had some sort of lesion that he called a wart removed several weeks ago by the podiatrist. It is difficult for him to see since it's between the fourth and fifth toes. He has not had any problems with the area. He is seen regularly to follow his atrial fib, COPD, and anticoagulation.    Review of Systems  Constitutional: Negative.   HENT: Negative.   Respiratory: Positive for shortness of breath.   Gastrointestinal: Negative.   Endocrine: Negative.   Genitourinary: Negative.   Musculoskeletal: Negative.   Neurological: Negative.        Objective:   Physical Exam  Constitutional: He appears well-developed.  Cardiovascular:  Irregular C/W AF  Pulmonary/Chest: Effort normal. He has wheezes.  Abdominal: Soft.  Skin:  Area between toes looks to be healing nicely with no erythema    BP 164/77  Pulse 45  Temp(Src) 97.5 F (36.4 C) (Oral)  Ht 5' 7.5" (1.715 m)  Wt 168 lb (76.204 kg)  BMI 25.91 kg/m2      Assessment & Plan:  1. Essential hypertension   2. Paroxysmal atrial fibrillation   3. Chronic obstructive pulmonary disease, unspecified COPD, unspecified chronic bronchitis type Uses inhaler intermittently  Wardell Honour MD

## 2014-09-07 ENCOUNTER — Encounter (INDEPENDENT_AMBULATORY_CARE_PROVIDER_SITE_OTHER): Payer: Medicare HMO | Admitting: Pharmacist

## 2014-09-07 ENCOUNTER — Ambulatory Visit: Payer: Self-pay | Admitting: Pharmacist

## 2014-09-07 ENCOUNTER — Ambulatory Visit (INDEPENDENT_AMBULATORY_CARE_PROVIDER_SITE_OTHER): Payer: Medicare HMO | Admitting: Pharmacist

## 2014-09-07 ENCOUNTER — Other Ambulatory Visit: Payer: Self-pay | Admitting: Family Medicine

## 2014-09-07 DIAGNOSIS — I480 Paroxysmal atrial fibrillation: Secondary | ICD-10-CM

## 2014-09-07 LAB — POCT INR: INR: 1.9

## 2014-09-07 NOTE — Progress Notes (Signed)
anticoag visit - see anticoag notes

## 2014-09-07 NOTE — Patient Instructions (Signed)
Anticoagulation Dose Instructions as of 09/07/2014     Don [name] Tue Wed Thu Fri Sat   New Dose 4 mg 4 mg 4 mg 4 mg 4 mg 6 mg 4 mg    Description       Take extra 1/2 tablet of warfarin today - Monday, October 26th.  Then continue warfarin 4mg  take 1 tablet daily except on fridays take 1 and 1/2 tablets.

## 2014-09-08 NOTE — Telephone Encounter (Signed)
Last seen 08/19/14 Dr Sabra Heck  Last lipid 10/16/13

## 2014-09-23 ENCOUNTER — Ambulatory Visit (INDEPENDENT_AMBULATORY_CARE_PROVIDER_SITE_OTHER): Payer: Commercial Managed Care - HMO | Admitting: Cardiology

## 2014-09-23 ENCOUNTER — Encounter: Payer: Self-pay | Admitting: Cardiology

## 2014-09-23 VITALS — BP 98/58 | HR 59 | Ht 67.5 in | Wt 166.0 lb

## 2014-09-23 DIAGNOSIS — I4891 Unspecified atrial fibrillation: Secondary | ICD-10-CM

## 2014-09-23 DIAGNOSIS — R0600 Dyspnea, unspecified: Secondary | ICD-10-CM

## 2014-09-23 NOTE — Progress Notes (Signed)
HPI The patient presents for followup of atrial fibrillation.  He had atrial fibrillation with rapid rate and with rate controlled. He has a slightly reduced ejection fraction of 45% with some regional wall motion abnormalities. However, this was managed medically.  This is his second visit with me.  At the last visit we discussed his elevated cardiac enzymes he had during his hospitalization. He was scheduled to have a stress perfusion study but he called and canceled this. He also had a possible down Madilyn Fireman is noted on exam but he canceled the ultrasound. He says he doesn't really recall doing this.  His back for followup he has been taking his warfarin. He has no new cardiovascular complaints. He unfortunately continues to smoke cigarettes. He's not noticing that his heart is out of rhythm. Not having any palpitations, presyncope or syncope. He's not had any PND or orthopnea. She's had no weight gain or edema.  Allergies  Allergen Reactions  . Wellbutrin [Bupropion] Anxiety    Current Outpatient Prescriptions  Medication Sig Dispense Refill  . albuterol (PROVENTIL HFA;VENTOLIN HFA) 108 (90 BASE) MCG/ACT inhaler Inhale 2 puffs into the lungs every 6 (six) hours as needed for wheezing or shortness of breath. 1 Inhaler 2  . atorvastatin (LIPITOR) 20 MG tablet TAKE 1 TABLET DAILY 30 tablet 0  . diclofenac sodium (VOLTAREN) 1 % GEL Apply 2 g topically 4 (four) times daily. 1 Tube 5  . digoxin (LANOXIN) 0.125 MG tablet TAKE 1 TABLET DAILY 30 tablet 2  . diltiazem (CARDIZEM CD) 300 MG 24 hr capsule TAKE (1) CAPSULE DAILY 30 capsule 5  . DiphenhydrAMINE HCl, Sleep, (SLEEP-AID MAXIMUM STRENGTH) 50 MG CAPS Take 1 capsule by mouth at bedtime.    . DULoxetine (CYMBALTA) 60 MG capsule TAKE (1) CAPSULE DAILY 30 capsule 2  . metoprolol tartrate (LOPRESSOR) 25 MG tablet TAKE  (1)  TABLET TWICE A DAY. 60 tablet 5  . Multiple Vitamin (MULTIVITAMIN WITH MINERALS) TABS Take 1 tablet by mouth daily.    .  nitroGLYCERIN (NITROSTAT) 0.4 MG SL tablet Place 0.4 mg under the tongue every 5 (five) minutes as needed for chest pain.    [name] omega-3 acid ethyl esters (LOVAZA) 1 G capsule Take 2 capsules (2 g total) by mouth 2 (two) times daily. 120 capsule 3  . pantoprazole (PROTONIX) 40 MG tablet Take 1 tablet (40 mg total) by mouth daily. 30 tablet 5  . Polyethyl Glycol-Propyl Glycol (SYSTANE OP) Place 1 drop into both eyes 2 (two) times daily as needed (dry eyes).     . terazosin (HYTRIN) 2 MG capsule TAKE 1 CAPSULE AT BEDTIME 30 capsule 5  . tobramycin (TOBREX) 0.3 % ophthalmic solution     . Umeclidinium-Vilanterol (ANORO ELLIPTA) 62.5-25 MCG/INH AEPB Inhale 1 puff into the lungs daily.    [name] warfarin (COUMADIN) 4 MG tablet TAKE 1 TO 1&1/2 TABLETS DAILY AS DIRECTED BY CLINIC 45 tablet 2   No current facility-administered medications for this visit.    Past Medical History  Diagnosis Date  . Hyperlipidemia   . Hypertension   . COPD (chronic obstructive pulmonary disease)   . Prostate cancer   . Tubular adenoma of colon 07/31/02, 11/18/03  . Noncompliance with medications 04/2013    xarelto, digoxin  . Tobacco abuse     ongoing  . Stage III chronic kidney disease 04/03/2014  . Acute bronchitis 04/03/2014  . Atrial fibrillation with RVR 04/03/2014    Past Surgical History  Procedure Laterality Date  .  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 Anal abcess,hemorroids,    . Esophagogastroduodenoscopy  11/18/2003    Dr. Earle Gell  . Colonoscopy w/ biopsies  11/18/2003    Dr. Earle Gell  . Tonsillectomy    . Colonoscopy N/A 04/05/2014    Procedure: COLONOSCOPY;  Surgeon: Ladene Artist, MD;  Location: [facility];  Service: Endoscopy;  Laterality: N/A;  . Esophagogastroduodenoscopy N/A 04/05/2014    Procedure: ESOPHAGOGASTRODUODENOSCOPY (EGD);  Surgeon: Ladene Artist, MD;  Location: [facility];  Service: Endoscopy;  Laterality: N/A;    ROS:  As stated in the HPI and negative for all  other systems.  PHYSICAL EXAM BP 98/58 mmHg  Pulse 59  Ht 5' 7.5" (1.715 m)  Wt 166 lb (75.297 kg)  BMI 25.60 kg/m2 GENERAL:  Well appearing HEENT:  Pupils equal round and reactive, fundi not visualized, oral mucosa unremarkable, edentulous NECK:  No jugular venous distention, waveform within normal limits, carotid upstroke brisk and symmetric, no bruits, no thyromegaly LUNGS:  Clear to auscultation bilaterally CHEST:  Unremarkable HEART:  PMI not displaced or sustained,S1 and S2 within normal limits, no S3,  no clicks, no rubs, no murmurs, irregular ABD:  Flat, positive bowel sounds normal in frequency in pitch, no bruits, no rebound, no guarding, questionable midline pulsatile mass, no hepatomegaly, no splenomegaly, mild abdominal tenderness EXT:  2 plus pulses upper and right lower ext mildly decreased DP/PT left leg, no edema, no cyanosis no clubbing SKIN:  No rashes no nodules  EKG:  Atrial fibrillation, rate 59 , axis within normal limits, intervals within normal limits, no acute ST-T wave changes.  09/23/2014 This  ASSESSMENT AND PLAN  Atrial fibrillation with RVR /  He is at high risk for thromboembolic events. He will continue anticoagulation. No change in therapy is indicated.  Elevated troponin / demand ischemia/abnormal echo He will need Lexiscan Myoview.  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  He would not be able to walk on a treadmill.  We discussed why he might cancel the last line he doesn't really know. I will try to reschedule this again.  I think that he is high risk for future cardiovascular events.   Abdominal pain Given this questionable pulsatile aorta with his ongoing tobacco abuse screening with an abdominal ultrasound for aneurysm is indicated.  I will reschedule this.    Hyperlipidemia  I will defer follow up to [facility]  Hypertension  The blood pressure is at target. No change in medications is indicated.   Tobacco abuse  We discussed the need to stop smoking and he thinks  that he might try.    Stage III CKD (chronic kidney disease)  His creat is stabel at 1.89 and is being followed at [facility].

## 2014-09-23 NOTE — Patient Instructions (Signed)
The current medical regimen is effective;  continue present plan and medications.  Your physician has requested that you have a lexiscan myoview. For further information please visit [email or web address]. Please follow instruction sheet, as given.  Your physician has requested that you have an abdominal aorta duplex. During this test, an ultrasound is used to evaluate the aorta. Allow 30 minutes for this exam. Do not eat after midnight the day before and avoid carbonated beverages  Follow up in 6 months with Dr. Percival Spanish.  You will receive a letter in the mail 2 months before you are due.  Please call us when you receive this letter to schedule your follow up appointment.

## 2014-10-01 NOTE — Addendum Note (Signed)
Addended by: Diana Eves on: 10/01/2014 01:33 PM   Modules accepted: Orders

## 2014-10-06 ENCOUNTER — Telehealth: Payer: Self-pay | Admitting: Family Medicine

## 2014-10-06 NOTE — Telephone Encounter (Signed)
Appointment given for tomorrow with Sabra Heck to check for a kidney infection.

## 2014-10-07 ENCOUNTER — Encounter: Payer: Self-pay | Admitting: Family Medicine

## 2014-10-07 ENCOUNTER — Telehealth: Payer: Self-pay | Admitting: Family Medicine

## 2014-10-07 ENCOUNTER — Ambulatory Visit (INDEPENDENT_AMBULATORY_CARE_PROVIDER_SITE_OTHER): Payer: Medicare HMO | Admitting: Family Medicine

## 2014-10-07 ENCOUNTER — Other Ambulatory Visit: Payer: Self-pay | Admitting: Family Medicine

## 2014-10-07 VITALS — BP 150/78 | HR 51 | Temp 96.1°F | Ht 67.5 in | Wt 166.0 lb

## 2014-10-07 DIAGNOSIS — I4891 Unspecified atrial fibrillation: Secondary | ICD-10-CM

## 2014-10-07 DIAGNOSIS — N390 Urinary tract infection, site not specified: Secondary | ICD-10-CM

## 2014-10-07 DIAGNOSIS — I10 Essential (primary) hypertension: Secondary | ICD-10-CM

## 2014-10-07 LAB — POCT UA - MICROSCOPIC ONLY
CRYSTALS, UR, HPF, POC: NEGATIVE
Casts, Ur, LPF, POC: NEGATIVE
YEAST UA: NEGATIVE

## 2014-10-07 LAB — POCT URINALYSIS DIPSTICK
Bilirubin, UA: NEGATIVE
Glucose, UA: NEGATIVE
KETONES UA: NEGATIVE
Leukocytes, UA: NEGATIVE
Nitrite, UA: NEGATIVE
SPEC GRAV UA: 1.015
Urobilinogen, UA: NEGATIVE
pH, UA: 6

## 2014-10-07 NOTE — Progress Notes (Signed)
   Subjective:    Patient ID: Don [name], male    DOB: [birth year], 73 y.o.   MRN: [id number]  HPI patient presents today for evaluation of possible urinary tract infection. He was seen in the [place] and her last week and they called him and told him he had kidney infection but they did not do a urinalysis, so it's hard to understand that call. He denies any flank pain or frequency. He does have some incontinence which is the result of a prostatectomy that he had 12 years ago for prostate cancer    Review of Systems  Constitutional: Negative.   HENT: Negative.   Eyes: Negative.   Respiratory: Negative.  Negative for shortness of breath.   Cardiovascular: Negative.  Negative for chest pain and leg swelling.  Gastrointestinal: Negative.   Genitourinary: Positive for frequency.  Musculoskeletal: Negative.   Skin: Negative.   Neurological: Negative.   Psychiatric/Behavioral: Negative.   All other systems reviewed and are negative.      Objective:   Physical Exam  Constitutional: He is oriented to person, place, and time. He appears well-developed and well-nourished.  HENT:  Head: Normocephalic.  Right Ear: External ear normal.  Left Ear: External ear normal.  Nose: Nose normal.  Mouth/Throat: Oropharynx is clear and moist.  Eyes: Conjunctivae and EOM are normal. Pupils are equal, round, and reactive to light.  Neck: Normal range of motion. Neck supple.  Cardiovascular: Normal rate, regular rhythm, normal heart sounds and intact distal pulses.   Pulmonary/Chest: Effort normal and breath sounds normal.  Abdominal: Soft. Bowel sounds are normal.  Musculoskeletal: Normal range of motion.  Neurological: He is alert and oriented to person, place, and time.  Skin: Skin is warm and dry.  Psychiatric: He has a normal mood and affect. His behavior is normal. Judgment and thought content normal.    BP 150/78 mmHg  Pulse 51  Temp(Src) 96.1 F (35.6 C) (Oral)  Ht 5' 7.5" (1.715 m)  Wt  166 lb (75.297 kg)  BMI 25.60 kg/m2      Assessment & Plan:  1. Urinary tract infection without hematuria, site unspecified Urinalysis does not support infection but we will do a culture and based treatment on those results - POCT urinalysis dipstick - POCT UA - Microscopic Only - Urine culture  2. Essential hypertension   3. Atrial fibrillation, unspecified

## 2014-10-07 NOTE — Telephone Encounter (Signed)
Pt aware urine cx done and results will be called to him when they come in.

## 2014-10-07 NOTE — Progress Notes (Signed)
Pt notified at appt 

## 2014-10-07 NOTE — Telephone Encounter (Signed)
-----   Message from Wardell Honour, MD sent at 10/07/2014 11:31 AM EST ----- Urinalysis does not support infection but will do culture

## 2014-10-08 LAB — URINE CULTURE

## 2014-10-09 ENCOUNTER — Telehealth: Payer: Self-pay

## 2014-10-09 NOTE — Telephone Encounter (Signed)
-----   Message from Wardell Honour, MD sent at 10/09/2014  7:41 AM EST ----- Culture is negative; no treatment needed

## 2014-10-09 NOTE — Telephone Encounter (Signed)
Culture is negative; no treatment needed

## 2014-10-12 ENCOUNTER — Ambulatory Visit (INDEPENDENT_AMBULATORY_CARE_PROVIDER_SITE_OTHER): Payer: Commercial Managed Care - HMO | Admitting: Pharmacist

## 2014-10-12 DIAGNOSIS — R7989 Other specified abnormal findings of blood chemistry: Secondary | ICD-10-CM

## 2014-10-12 DIAGNOSIS — I480 Paroxysmal atrial fibrillation: Secondary | ICD-10-CM

## 2014-10-12 DIAGNOSIS — R748 Abnormal levels of other serum enzymes: Secondary | ICD-10-CM

## 2014-10-12 LAB — POCT INR: INR: 2

## 2014-10-12 NOTE — Patient Instructions (Signed)
Anticoagulation Dose Instructions as of 10/12/2014      Don [name] Tue Wed Thu Fri Sat   New Dose 4 mg 4 mg 4 mg 4 mg 4 mg 6 mg 4 mg    Description        Continue warfarin 4mg  take 1 tablet daily except on fridays take 1 and 1/2 tablets.      INR was 2.0 today

## 2014-10-22 ENCOUNTER — Ambulatory Visit (HOSPITAL_BASED_OUTPATIENT_CLINIC_OR_DEPARTMENT_OTHER): Payer: Commercial Managed Care - HMO | Admitting: Radiology

## 2014-10-22 ENCOUNTER — Ambulatory Visit (HOSPITAL_COMMUNITY): Payer: Commercial Managed Care - HMO | Attending: Cardiology | Admitting: *Deleted

## 2014-10-22 DIAGNOSIS — R931 Abnormal findings on diagnostic imaging of heart and coronary circulation: Secondary | ICD-10-CM | POA: Diagnosis not present

## 2014-10-22 DIAGNOSIS — I4891 Unspecified atrial fibrillation: Secondary | ICD-10-CM

## 2014-10-22 DIAGNOSIS — Z139 Encounter for screening, unspecified: Secondary | ICD-10-CM

## 2014-10-22 DIAGNOSIS — I10 Essential (primary) hypertension: Secondary | ICD-10-CM

## 2014-10-22 DIAGNOSIS — R0989 Other specified symptoms and signs involving the circulatory and respiratory systems: Secondary | ICD-10-CM | POA: Insufficient documentation

## 2014-10-22 MED ORDER — TECHNETIUM TC 99M SESTAMIBI GENERIC - CARDIOLITE
11.0000 | Freq: Once | INTRAVENOUS | Status: AC | PRN
  Administered 2014-10-22: 11 via INTRAVENOUS

## 2014-10-22 MED ORDER — TECHNETIUM TC 99M SESTAMIBI GENERIC - CARDIOLITE
33.0000 | Freq: Once | INTRAVENOUS | Status: AC | PRN
  Administered 2014-10-22: 33 via INTRAVENOUS

## 2014-10-22 MED ORDER — REGADENOSON 0.4 MG/5ML IV SOLN
0.4000 mg | Freq: Once | INTRAVENOUS | Status: AC
  Administered 2014-10-22: 0.4 mg via INTRAVENOUS

## 2014-10-22 NOTE — Progress Notes (Signed)
[facility] [address] [place], [place] [address] [phone]    Cardiology Nuclear Med Study  Don [name] is a 73 y.o. male     MRN : [id number]     DOB: 1941-10-18  Procedure Date: 10/22/2014  Nuclear Med Background Indication for Stress Test:  Evaluation for Ischemia History:  GXT ~2 yrs ago (ok per pt) Cardiac Risk Factors: Hypertension  Symptoms:  DOE   Nuclear Pre-Procedure Caffeine/Decaff Intake:  None> 12 hrs NPO After: 6:00pm   Lungs:  clear O2 Sat: 96% on room air. IV 0.9% NS with Angio Cath:  22g  IV Site: R Antecubital x 1, tolerated well IV Started by:  Irven Baltimore, RN  Chest Size (in):  40 Cup Size: n/a  Height: 5\' 8"  (1.727 m)  Weight:  165 lb (74.844 kg)  BMI:  Body mass index is 25.09 kg/(m^2). Tech Comments:  Patient took Lopressor this am. Irven Baltimore, RN.    Nuclear Med Study 1 or 2 day study: 1 day  Stress Test Type:  Lexiscan  Reading MD: N/A  Order Authorizing Provider:  Minus Breeding, MD  Resting Radionuclide: Technetium 69m Sestamibi  Resting Radionuclide Dose: 11.0 mCi   Stress Radionuclide:  Technetium 74m Sestamibi  Stress Radionuclide Dose: 33.0 mCi           Stress Protocol Rest HR: 47 Stress HR: 65  Rest BP: 141/79 Stress BP: 135/62  Exercise Time (min): n/a METS: n/a   Predicted Max HR: 145 bpm % Max HR: 44.83 bpm Rate Pressure Product: 9620   Dose of Adenosine (mg):  n/a Dose of Lexiscan: 0.4 mg  Dose of Atropine (mg): n/a Dose of Dobutamine: n/a mcg/kg/min (at max HR)  Stress Test Technologist: Glade Lloyd, BS-ES  Nuclear Technologist:  Earl Many, CNMT     Rest Procedure:  Myocardial perfusion imaging was performed at rest 45 minutes following the intravenous administration of Technetium 52m Sestamibi. Rest ECG: NSR with non-specific ST-T wave changes  Stress Procedure:  The patient received IV Lexiscan 0.4 mg over 15-seconds.  Technetium 26m Sestamibi injected at 30-seconds.   Quantitative spect images were obtained after a 45 minute delay. Stress ECG: No significant change from baseline ECG  QPS Raw Data Images:  Normal; no motion artifact; normal heart/lung ratio. Stress Images:  There is a medium sized area of moderate attenuation in the mid / basal inferolateral wall.   Rest Images:  There is a medium sized area of moderate attenuation in the mid / basal inferolateral wall.  Subtraction (SDS):  No evidence of ischemia.  There is a medium sized fixed defect in the inferolateral wall Transient Ischemic Dilatation (Normal <1.22):  1.10 Lung/Heart Ratio (Normal <0.45):  0.24  Quantitative Gated Spect Images QGS EDV:  139 ml QGS ESV:  63 ml  Impression Exercise Capacity:  Lexiscan with no exercise. BP Response:  Normal blood pressure response. Clinical Symptoms:  No significant symptoms noted. ECG Impression:  No significant ST segment change suggestive of ischemia. Comparison with Prior Nuclear Study: No images to compare  Overall Impression:  Intermediate risk stress nuclear study .  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  The overall LV function is well preserved but there is mild hypokinesis of the inferior wall.     .  LV Ejection Fraction: 55%.  LV Wall Motion:  There is mild hypokinesis of the inferior wall.  The overall LV function is well preserved.  Thayer Headings, Brooke Bonito., MD, [facility] 10/22/2014, 4:05 PM [address]  [place] Pager [phone]

## 2014-10-22 NOTE — Progress Notes (Signed)
Abdominal Aorta Screening Complete.

## 2014-11-03 ENCOUNTER — Other Ambulatory Visit: Payer: Self-pay | Admitting: Pharmacist

## 2014-11-11 ENCOUNTER — Other Ambulatory Visit: Payer: Self-pay | Admitting: Family Medicine

## 2014-11-19 ENCOUNTER — Encounter: Payer: Self-pay | Admitting: Pharmacist

## 2014-11-19 ENCOUNTER — Ambulatory Visit (INDEPENDENT_AMBULATORY_CARE_PROVIDER_SITE_OTHER): Payer: Commercial Managed Care - HMO | Admitting: Pharmacist

## 2014-11-19 DIAGNOSIS — I480 Paroxysmal atrial fibrillation: Secondary | ICD-10-CM

## 2014-11-19 LAB — POCT INR: INR: 1.5

## 2014-11-19 NOTE — Patient Instructions (Signed)
Anticoagulation Dose Instructions as of 11/19/2014      [name] Tue Wed Thu Fri Sat   New Dose 4 mg 6 mg 4 mg 4 mg 4 mg 6 mg 4 mg    Description        Increase warfarin dose to 4mg  take 1 tablet daily except on fridays and mondays take 1 and 1/2 tablets.     INR was 1.5 today

## 2014-12-02 DIAGNOSIS — R809 Proteinuria, unspecified: Secondary | ICD-10-CM | POA: Diagnosis not present

## 2014-12-02 DIAGNOSIS — I4891 Unspecified atrial fibrillation: Secondary | ICD-10-CM | POA: Diagnosis not present

## 2014-12-02 DIAGNOSIS — I10 Essential (primary) hypertension: Secondary | ICD-10-CM | POA: Diagnosis not present

## 2014-12-02 DIAGNOSIS — N183 Chronic kidney disease, stage 3 (moderate): Secondary | ICD-10-CM | POA: Diagnosis not present

## 2014-12-03 ENCOUNTER — Ambulatory Visit (INDEPENDENT_AMBULATORY_CARE_PROVIDER_SITE_OTHER): Payer: Commercial Managed Care - HMO | Admitting: Pharmacist

## 2014-12-03 ENCOUNTER — Encounter: Payer: Self-pay | Admitting: Pharmacist

## 2014-12-03 VITALS — BP 138/72 | HR 68 | Ht 68.0 in | Wt 168.0 lb

## 2014-12-03 DIAGNOSIS — I4891 Unspecified atrial fibrillation: Secondary | ICD-10-CM | POA: Diagnosis not present

## 2014-12-03 LAB — POCT INR: INR: 2

## 2014-12-03 NOTE — Patient Instructions (Addendum)
Anticoagulation Dose Instructions as of 12/03/2014      Don [name] Tue Wed Thu Fri Sat   New Dose 4 mg 6 mg 4 mg 4 mg 4 mg 6 mg 4 mg    Description        Continue current warfarin dose of 4mg  take 1 tablet daily except on fridays and mondays take 1 and 1/2 tablets.      INR was 2.0 today

## 2014-12-09 ENCOUNTER — Other Ambulatory Visit: Payer: Self-pay | Admitting: Family Medicine

## 2014-12-18 ENCOUNTER — Other Ambulatory Visit (HOSPITAL_COMMUNITY): Payer: Self-pay | Admitting: Nephrology

## 2014-12-18 DIAGNOSIS — N183 Chronic kidney disease, stage 3 unspecified: Secondary | ICD-10-CM

## 2014-12-25 ENCOUNTER — Emergency Department (HOSPITAL_COMMUNITY)
Admission: EM | Admit: 2014-12-25 | Discharge: 2014-12-25 | Disposition: A | Discharge status: Home / Self Care | Payer: Commercial Managed Care - HMO | Attending: Emergency Medicine | Admitting: Emergency Medicine

## 2014-12-25 ENCOUNTER — Encounter (HOSPITAL_COMMUNITY): Payer: Self-pay | Admitting: Emergency Medicine

## 2014-12-25 ENCOUNTER — Emergency Department (HOSPITAL_COMMUNITY): Payer: Commercial Managed Care - HMO

## 2014-12-25 DIAGNOSIS — R42 Dizziness and giddiness: Secondary | ICD-10-CM | POA: Diagnosis not present

## 2014-12-25 DIAGNOSIS — I129 Hypertensive chronic kidney disease with stage 1 through stage 4 chronic kidney disease, or unspecified chronic kidney disease: Secondary | ICD-10-CM | POA: Insufficient documentation

## 2014-12-25 DIAGNOSIS — E785 Hyperlipidemia, unspecified: Secondary | ICD-10-CM | POA: Insufficient documentation

## 2014-12-25 DIAGNOSIS — J441 Chronic obstructive pulmonary disease with (acute) exacerbation: Secondary | ICD-10-CM | POA: Insufficient documentation

## 2014-12-25 DIAGNOSIS — I4891 Unspecified atrial fibrillation: Secondary | ICD-10-CM | POA: Insufficient documentation

## 2014-12-25 DIAGNOSIS — R05 Cough: Secondary | ICD-10-CM

## 2014-12-25 DIAGNOSIS — Z7901 Long term (current) use of anticoagulants: Secondary | ICD-10-CM | POA: Diagnosis not present

## 2014-12-25 DIAGNOSIS — Z79899 Other long term (current) drug therapy: Secondary | ICD-10-CM | POA: Diagnosis not present

## 2014-12-25 DIAGNOSIS — Z720 Tobacco use: Secondary | ICD-10-CM | POA: Insufficient documentation

## 2014-12-25 DIAGNOSIS — N183 Chronic kidney disease, stage 3 (moderate): Secondary | ICD-10-CM | POA: Diagnosis not present

## 2014-12-25 DIAGNOSIS — R531 Weakness: Secondary | ICD-10-CM | POA: Diagnosis not present

## 2014-12-25 DIAGNOSIS — R109 Unspecified abdominal pain: Secondary | ICD-10-CM | POA: Insufficient documentation

## 2014-12-25 DIAGNOSIS — Z8546 Personal history of malignant neoplasm of prostate: Secondary | ICD-10-CM | POA: Insufficient documentation

## 2014-12-25 DIAGNOSIS — Z86010 Personal history of colonic polyps: Secondary | ICD-10-CM | POA: Insufficient documentation

## 2014-12-25 DIAGNOSIS — Z791 Long term (current) use of non-steroidal anti-inflammatories (NSAID): Secondary | ICD-10-CM | POA: Insufficient documentation

## 2014-12-25 DIAGNOSIS — I499 Cardiac arrhythmia, unspecified: Secondary | ICD-10-CM | POA: Diagnosis not present

## 2014-12-25 DIAGNOSIS — R059 Cough, unspecified: Secondary | ICD-10-CM

## 2014-12-25 DIAGNOSIS — R404 Transient alteration of awareness: Secondary | ICD-10-CM | POA: Diagnosis not present

## 2014-12-25 DIAGNOSIS — J449 Chronic obstructive pulmonary disease, unspecified: Secondary | ICD-10-CM | POA: Diagnosis not present

## 2014-12-25 DIAGNOSIS — R079 Chest pain, unspecified: Secondary | ICD-10-CM | POA: Diagnosis not present

## 2014-12-25 LAB — COMPREHENSIVE METABOLIC PANEL
ALBUMIN: 3 g/dL — AB (ref 3.5–5.2)
ALK PHOS: 95 U/L (ref 39–117)
ALT: 27 U/L (ref 0–53)
ANION GAP: 6 (ref 5–15)
AST: 23 U/L (ref 0–37)
BUN: 22 mg/dL (ref 6–23)
CHLORIDE: 104 mmol/L (ref 96–112)
CO2: 27 mmol/L (ref 19–32)
Calcium: 8.4 mg/dL (ref 8.4–10.5)
Creatinine, Ser: 1.88 mg/dL — ABNORMAL HIGH (ref 0.50–1.35)
GFR calc Af Amer: 39 mL/min — ABNORMAL LOW (ref >90)
GFR calc non Af Amer: 33 mL/min — ABNORMAL LOW (ref >90)
Glucose, Bld: 120 mg/dL — ABNORMAL HIGH (ref 70–99)
Potassium: 3.9 mmol/L (ref 3.5–5.1)
Sodium: 137 mmol/L (ref 135–145)
Total Bilirubin: 1.5 mg/dL — ABNORMAL HIGH (ref 0.3–1.2)
Total Protein: 6.1 g/dL (ref 6.0–8.3)

## 2014-12-25 LAB — PROTIME-INR
INR: 1.54 — ABNORMAL HIGH (ref 0.00–1.49)
Prothrombin Time: 18.6 seconds — ABNORMAL HIGH (ref 11.6–15.2)

## 2014-12-25 LAB — CBC WITH DIFFERENTIAL/PLATELET
BASOS PCT: 0 % (ref 0–1)
Basophils Absolute: 0 10*3/uL (ref 0.0–0.1)
EOS ABS: 0.1 10*3/uL (ref 0.0–0.7)
Eosinophils Relative: 1 % (ref 0–5)
HEMATOCRIT: 41.7 % (ref 39.0–52.0)
Hemoglobin: 14.3 g/dL (ref 13.0–17.0)
Lymphocytes Relative: 12 % (ref 12–46)
Lymphs Abs: 1.1 10*3/uL (ref 0.7–4.0)
MCH: 32.9 pg (ref 26.0–34.0)
MCHC: 34.3 g/dL (ref 30.0–36.0)
MCV: 95.9 fL (ref 78.0–100.0)
MONO ABS: 1.1 10*3/uL — AB (ref 0.1–1.0)
Monocytes Relative: 12 % (ref 3–12)
Neutro Abs: 6.7 10*3/uL (ref 1.7–7.7)
Neutrophils Relative %: 75 % (ref 43–77)
Platelets: 176 10*3/uL (ref 150–400)
RBC: 4.35 MIL/uL (ref 4.22–5.81)
RDW: 13.2 % (ref 11.5–15.5)
WBC: 9 10*3/uL (ref 4.0–10.5)

## 2014-12-25 LAB — TROPONIN I: TROPONIN I: 0.03 ng/mL (ref <0.031)

## 2014-12-25 MED ORDER — DM-GUAIFENESIN ER 30-600 MG PO TB12: 1.0000 | ORAL_TABLET | Freq: Two times a day (BID) | ORAL | Status: DC

## 2014-12-25 MED ORDER — SODIUM CHLORIDE 0.9 % IV SOLN
INTRAVENOUS | Status: DC
  Administered 2014-12-25: 13:00:00 via INTRAVENOUS

## 2014-12-25 NOTE — ED Notes (Signed)
Pt states that he has been having intermittent chest pain for the past month.  States that his pain is gone at this time and that it subsided when ems gave him oxygen.

## 2014-12-25 NOTE — ED Provider Notes (Addendum)
CSN: [id number]     Arrival date & time 12/25/14  1132 History  This chart was scribed for Don Sorrow, MD by Zola Button, ED Scribe. This patient was seen in room APA09/APA09 and the patient's care was started at 11:48 AM.       Chief Complaint  Patient presents with  . Chest Pain   Patient is a 74 y.o. male presenting with dizziness. The history is provided by the patient. No language interpreter was used.  Dizziness Onset quality:  Gradual Timing:  Intermittent Progression:  Unchanged Chronicity:  New Relieved by:  None tried Associated symptoms: chest pain, headaches, shortness of breath and weakness   Associated symptoms: no diarrhea, no nausea and no vomiting    HPI Comments: Don [name] is a 74 y.o. male with a hx of HLD, HTN, COPD, prostate cancer, tubular adenoma of colon, A Fib with RVR and stage III chronic kidney disease who presents to the Emergency Department complaining of gradual onset, intermittent dizziness that started 1 week ago. Patient also reports having generalized weakness, cough, SOB with cough and intermittent chest pain with cough that started a few weeks ago. He states that he does not currently have chest pain or dizziness, but he thinks the dizziness may return with exertion. Patient also notes having subjective fever and chills a few days ago; he was able to get some relief with Tylenol. He denies room-spinning sensation. He also denies hx of heart surgeries and stents.  PCP: Dr. Sabra Heck in [place] Cardiologist: [facility]  Past Medical History  Diagnosis Date  . Hyperlipidemia   . Hypertension   . COPD (chronic obstructive pulmonary disease)   . Prostate cancer   . Tubular adenoma of colon 07/31/02, 11/18/03  . Noncompliance with medications 04/2013    xarelto, digoxin  . Tobacco abuse     ongoing  . Stage III chronic kidney disease 04/03/2014  . Acute bronchitis 04/03/2014  . Atrial fibrillation with RVR 04/03/2014    Past Surgical History  Procedure Laterality Date  . 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 Anal abcess,hemorroids,    . Esophagogastroduodenoscopy  11/18/2003    Dr. Earle Gell  . Colonoscopy w/ biopsies  11/18/2003    Dr. Earle Gell  . Tonsillectomy    . Colonoscopy N/A 04/05/2014    Procedure: COLONOSCOPY;  Surgeon: Ladene Artist, MD;  Location: [facility];  Service: Endoscopy;  Laterality: N/A;  . Esophagogastroduodenoscopy N/A 04/05/2014    Procedure: ESOPHAGOGASTRODUODENOSCOPY (EGD);  Surgeon: Ladene Artist, MD;  Location: [facility];  Service: Endoscopy;  Laterality: N/A;   Family History  Problem Relation Age of Onset  . Diabetes Father   . Heart disease Father     MI  . Heart attack Father   . Heart attack Mother   . Heart disease Brother   . Cancer Brother   . Lung cancer Brother   . Heart disease Brother   . Lung cancer Sister   . Cancer Sister     lung  . Heart disease Brother    History  Substance Use Topics  . Smoking status: Current Every Day Smoker    Types: Cigarettes, Cigars  . Smokeless tobacco: Never Used  . Alcohol Use: No    Review of Systems  Constitutional: Positive for fever and chills.  HENT: Positive for congestion and sore throat.   Eyes: Negative for visual disturbance.  Respiratory: Positive for cough and shortness of breath.   Cardiovascular: Positive  for chest pain and leg swelling.  Gastrointestinal: Positive for abdominal pain. Negative for nausea, vomiting and diarrhea.  Genitourinary: Positive for dysuria. Negative for hematuria.  Musculoskeletal: Positive for back pain.  Skin: Negative for rash.  Neurological: Positive for dizziness, weakness, numbness and headaches.  Hematological: Bruises/bleeds easily.      Allergies  Wellbutrin  Home Medications   Prior to Admission medications   Medication Sig Start Date End Date Taking? Authorizing Provider  albuterol (PROVENTIL HFA;VENTOLIN HFA) 108 (90 BASE)  MCG/ACT inhaler Inhale 2 puffs into the lungs every 6 (six) hours as needed for wheezing or shortness of breath. 04/16/14  Yes Lysbeth Penner, FNP  atorvastatin (LIPITOR) 20 MG tablet TAKE 1 TABLET DAILY 11/11/14  Yes Wardell Honour, MD  clonazePAM (KLONOPIN) 1 MG tablet Take 0.5 mg by mouth daily as needed for anxiety.   Yes Historical Provider, MD  diclofenac sodium (VOLTAREN) 1 % GEL Apply 2 g topically 4 (four) times daily. 04/24/14  Yes Lysbeth Penner, FNP  digoxin (LANOXIN) 0.125 MG tablet TAKE 1 TABLET DAILY 12/09/14  Yes Wardell Honour, MD  diltiazem (CARDIZEM CD) 300 MG 24 hr capsule TAKE (1) CAPSULE DAILY 09/09/14  Yes Wardell Honour, MD  DULoxetine (CYMBALTA) 60 MG capsule TAKE (1) CAPSULE DAILY 12/09/14  Yes Wardell Honour, MD  ketotifen (ZADITOR) 0.025 % ophthalmic solution Place 2 drops into the left eye 2 (two) times daily.   Yes Historical Provider, MD  metoprolol tartrate (LOPRESSOR) 25 MG tablet TAKE  (1)  TABLET TWICE A DAY. 09/09/14  Yes Wardell Honour, MD  nitroGLYCERIN (NITROSTAT) 0.4 MG SL tablet Place 0.4 mg under the tongue every 5 (five) minutes as needed for chest pain.   Yes Historical Provider, MD  omega-3 acid ethyl esters (LOVAZA) 1 G capsule Take 2 capsules (2 g total) by mouth 2 (two) times daily. 11/03/14  Yes Wardell Honour, MD  pantoprazole (PROTONIX) 40 MG tablet Take 1 tablet (40 mg total) by mouth daily. 07/13/14  Yes Lysbeth Penner, FNP  terazosin (HYTRIN) 2 MG capsule TAKE 1 CAPSULE AT BEDTIME 09/09/14  Yes Wardell Honour, MD  tobramycin (TOBREX) 0.3 % ophthalmic solution Place 2 drops into the right eye.  07/24/14  Yes Historical Provider, MD  Umeclidinium-Vilanterol (ANORO ELLIPTA) 62.5-25 MCG/INH AEPB Inhale 1 puff into the lungs daily.   Yes Historical Provider, MD  warfarin (COUMADIN) 4 MG tablet TAKE 1 TO 1&1/2 TABLETS DAILY AS DIRECTED BY CLINIC 09/09/14  Yes Wardell Honour, MD  DiphenhydrAMINE HCl, Sleep, (SLEEP-AID MAXIMUM STRENGTH) 50  MG CAPS Take 1 capsule by mouth at bedtime.    Historical Provider, MD  Multiple Vitamin (MULTIVITAMIN WITH MINERALS) TABS Take 1 tablet by mouth daily.    Historical Provider, MD  Polyethyl Glycol-Propyl Glycol (SYSTANE OP) Place 1 drop into both eyes 2 (two) times daily as needed (dry eyes).     Historical Provider, MD   BP 140/85 mmHg  Pulse 60  Temp(Src) 97.5 F (36.4 C) (Oral)  Resp 17  Ht 5\' 9"  (1.753 m)  Wt 165 lb (74.844 kg)  BMI 24.36 kg/m2  SpO2 92% Physical Exam  Constitutional: He is oriented to person, place, and time. He appears well-developed and well-nourished. No distress.  HENT:  Head: Normocephalic and atraumatic.  Mouth/Throat: Mucous membranes are dry. No oropharyngeal exudate.  Dry mucous membranes.  Eyes: Pupils are equal, round, and reactive to light.  Neck: Neck supple.  Cardiovascular: Normal rate.  An irregular rhythm present.  No murmur heard. Pulmonary/Chest: Effort normal and breath sounds normal. No respiratory distress. He has no wheezes. He has no rales.  CTAB.  Abdominal: Soft. Bowel sounds are normal. There is no tenderness.  Musculoskeletal: He exhibits no edema.  No ankle swelling.  Neurological: He is alert and oriented to person, place, and time. No cranial nerve deficit.  Skin: Skin is warm and dry. No rash noted.  Psychiatric: He has a normal mood and affect. His behavior is normal.  Nursing note and vitals reviewed.   ED Course  Procedures  DIAGNOSTIC STUDIES: Oxygen Saturation is 96% on room air, adequate by my interpretation.    COORDINATION OF CARE: 11:57 AM-Discussed treatment plan which includes imaging and labs with pt at bedside and pt agreed to plan.   Labs Review Labs Reviewed  CBC WITH DIFFERENTIAL/PLATELET - Abnormal; Notable for the following:    Monocytes Absolute 1.1 (*)    All other components within normal limits  COMPREHENSIVE METABOLIC PANEL - Abnormal; Notable for the following:    Glucose, Bld 120 (*)     Creatinine, Ser 1.88 (*)    Albumin 3.0 (*)    Total Bilirubin 1.5 (*)    GFR calc non Af Amer 33 (*)    GFR calc Af Amer 39 (*)    All other components within normal limits  PROTIME-INR - Abnormal; Notable for the following:    Prothrombin Time 18.6 (*)    INR 1.54 (*)    All other components within normal limits  TROPONIN I   Results for orders placed or performed during the hospital encounter of 12/25/14  Troponin I  Result Value Ref Range   Troponin I 0.03 <0.031 ng/mL  CBC with Differential  Result Value Ref Range   WBC 9.0 4.0 - 10.5 K/uL   RBC 4.35 4.22 - 5.81 MIL/uL   Hemoglobin 14.3 13.0 - 17.0 g/dL   HCT 41.7 39.0 - 52.0 %   MCV 95.9 78.0 - 100.0 fL   MCH 32.9 26.0 - 34.0 pg   MCHC 34.3 30.0 - 36.0 g/dL   RDW 13.2 11.5 - 15.5 %   Platelets 176 150 - 400 K/uL   Neutrophils Relative % 75 43 - 77 %   Neutro Abs 6.7 1.7 - 7.7 K/uL   Lymphocytes Relative 12 12 - 46 %   Lymphs Abs 1.1 0.7 - 4.0 K/uL   Monocytes Relative 12 3 - 12 %   Monocytes Absolute 1.1 (H) 0.1 - 1.0 K/uL   Eosinophils Relative 1 0 - 5 %   Eosinophils Absolute 0.1 0.0 - 0.7 K/uL   Basophils Relative 0 0 - 1 %   Basophils Absolute 0.0 0.0 - 0.1 K/uL  Comprehensive metabolic panel  Result Value Ref Range   Sodium 137 135 - 145 mmol/L   Potassium 3.9 3.5 - 5.1 mmol/L   Chloride 104 96 - 112 mmol/L   CO2 27 19 - 32 mmol/L   Glucose, Bld 120 (H) 70 - 99 mg/dL   BUN 22 6 - 23 mg/dL   Creatinine, Ser 1.88 (H) 0.50 - 1.35 mg/dL   Calcium 8.4 8.4 - 10.5 mg/dL   Total Protein 6.1 6.0 - 8.3 g/dL   Albumin 3.0 (L) 3.5 - 5.2 g/dL   AST 23 0 - 37 U/L   ALT 27 0 - 53 U/L   Alkaline Phosphatase 95 39 - 117 U/L   Total Bilirubin 1.5 (H) 0.3 - 1.2  mg/dL   GFR calc non Af Amer 33 (L) >90 mL/min   GFR calc Af Amer 39 (L) >90 mL/min   Anion gap 6 5 - 15  Protime-INR  Result Value Ref Range   Prothrombin Time 18.6 (H) 11.6 - 15.2 seconds   INR 1.54 (H) 0.00 - 1.49    Imaging Review Dg Chest 2  View  12/25/2014   CLINICAL DATA:  Chest pain and cough for months, shortness of breath  EXAM: CHEST  2 VIEW  COMPARISON:  04/03/2014  FINDINGS: Cardiac shadow is stable. The lungs are well aerated bilaterally. No focal infiltrate or sizable effusion is seen. Mild hyperinflation is noted consistent with COPD.  IMPRESSION: COPD without acute abnormality.   Electronically Signed   By: Inez Catalina M.D.   On: 12/25/2014 13:54     EKG Interpretation   Date/Time:  Friday December 25 2014 11:34:13 EST Ventricular Rate:  61 PR Interval:  336 QRS Duration: 95 QT Interval:  397 QTC Calculation: 400 R Axis:   75 Text Interpretation:  Unknown rhythm, irregular rate Prolonged PR interval  Probable left atrial enlargement Repol abnrm suggests ischemia, diffuse  leads Baseline wander in lead(s) V1 Confirmed by Camia Dipinto  MD, Abdulahad Mederos  ([id number]) on 12/25/2014 11:37:40 AM      MDM   Final diagnoses:  Dizziness    Patient with a concern for the dizziness. And the generalized weakness that occurred this morning. Patient wasn't really that concerned about chest pain although nursing note focuses and on that. The chest pain has been intermittent and going on for several weeks. Patient was as stated more concerned about dizziness no true vertigo with it. Patient's chest x-ray is consistent with COPD. Patient has had a bit of a persistent cough but no wheezing. Oxygen saturations on room air have been in the mid 90 percentile range. Patient's troponin was negative EKG had some repull abnormality is a suggested ischemia however patient without any chest pain or discomfort now.  Head CT is pending. If negative patient probably can be discharged home. Labs without significant abnormalities. Some mild renal insufficiency. No leukocytosis no significant anemia.    I personally performed the services described in this documentation, which was scribed in my presence. The recorded information has been reviewed and is  accurate.     Don Sorrow, MD 12/25/14 1501  Patients CT of the brain is negative. Patient without any vertigo symptoms. Do not feel MRI brain is necessary. Patient's main concern was the dizziness and cough. Patient already taking albuterol and Symbicort treatments at home. I will give a trial of Mucinex DM. Patient will return for any new or worse symptoms.  Don Sorrow, MD 12/25/14 [phone]

## 2014-12-25 NOTE — Discharge Instructions (Signed)
Take the Mucinex DM 12 hour tablet try that for a few days for the cough. Make an appointment to follow-up with your regular doctor. If the dizziness does not resolve then MRI of the brain would be appropriate. Return for any new or worse symptoms.

## 2014-12-30 ENCOUNTER — Ambulatory Visit (HOSPITAL_COMMUNITY)
Admission: RE | Admit: 2014-12-30 | Discharge: 2014-12-30 | Disposition: A | Discharge status: Home / Self Care | Payer: Commercial Managed Care - HMO | Source: Ambulatory Visit | Attending: Nephrology | Admitting: Nephrology

## 2014-12-30 DIAGNOSIS — N183 Chronic kidney disease, stage 3 unspecified: Secondary | ICD-10-CM

## 2014-12-30 DIAGNOSIS — N189 Chronic kidney disease, unspecified: Secondary | ICD-10-CM | POA: Diagnosis not present

## 2015-01-04 ENCOUNTER — Ambulatory Visit (INDEPENDENT_AMBULATORY_CARE_PROVIDER_SITE_OTHER): Payer: Commercial Managed Care - HMO | Admitting: Pharmacist

## 2015-01-04 ENCOUNTER — Other Ambulatory Visit: Payer: Self-pay | Admitting: Family Medicine

## 2015-01-04 DIAGNOSIS — I4891 Unspecified atrial fibrillation: Secondary | ICD-10-CM

## 2015-01-04 LAB — POCT INR: INR: 2

## 2015-01-15 DIAGNOSIS — E119 Type 2 diabetes mellitus without complications: Secondary | ICD-10-CM | POA: Diagnosis not present

## 2015-01-15 DIAGNOSIS — N183 Chronic kidney disease, stage 3 (moderate): Secondary | ICD-10-CM | POA: Diagnosis not present

## 2015-01-15 DIAGNOSIS — E559 Vitamin D deficiency, unspecified: Secondary | ICD-10-CM | POA: Diagnosis not present

## 2015-01-15 DIAGNOSIS — D519 Vitamin B12 deficiency anemia, unspecified: Secondary | ICD-10-CM | POA: Diagnosis not present

## 2015-01-15 DIAGNOSIS — Z79899 Other long term (current) drug therapy: Secondary | ICD-10-CM | POA: Diagnosis not present

## 2015-01-15 DIAGNOSIS — I10 Essential (primary) hypertension: Secondary | ICD-10-CM | POA: Diagnosis not present

## 2015-01-29 ENCOUNTER — Ambulatory Visit (INDEPENDENT_AMBULATORY_CARE_PROVIDER_SITE_OTHER): Payer: Commercial Managed Care - HMO | Admitting: Family Medicine

## 2015-01-29 ENCOUNTER — Encounter: Payer: Self-pay | Admitting: Family Medicine

## 2015-01-29 VITALS — BP 129/66 | HR 44 | Temp 96.6°F | Ht 68.0 in | Wt 167.0 lb

## 2015-01-29 DIAGNOSIS — I480 Paroxysmal atrial fibrillation: Secondary | ICD-10-CM

## 2015-01-29 DIAGNOSIS — J449 Chronic obstructive pulmonary disease, unspecified: Secondary | ICD-10-CM

## 2015-01-29 DIAGNOSIS — I10 Essential (primary) hypertension: Secondary | ICD-10-CM

## 2015-01-29 LAB — POCT INR: INR: 2.2
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
## 2015-02-03 ENCOUNTER — Other Ambulatory Visit: Payer: Self-pay | Admitting: Family Medicine

## 2015-02-04 ENCOUNTER — Other Ambulatory Visit: Payer: Self-pay

## 2015-02-04 MED ORDER — CLONAZEPAM 0.5 MG PO TABS: 0.5000 mg | ORAL_TABLET | Freq: Every day | ORAL | Status: DC | PRN

## 2015-02-04 NOTE — Telephone Encounter (Signed)
Last seen 01/29/15 Dr Sabra Heck  If approved route to nurse to call into [facility]

## 2015-02-04 NOTE — Telephone Encounter (Signed)
rx called into pharmacy

## 2015-02-17 DIAGNOSIS — R809 Proteinuria, unspecified: Secondary | ICD-10-CM | POA: Diagnosis not present

## 2015-02-17 DIAGNOSIS — N183 Chronic kidney disease, stage 3 (moderate): Secondary | ICD-10-CM | POA: Diagnosis not present

## 2015-02-17 DIAGNOSIS — D649 Anemia, unspecified: Secondary | ICD-10-CM | POA: Diagnosis not present

## 2015-02-17 DIAGNOSIS — E1129 Type 2 diabetes mellitus with other diabetic kidney complication: Secondary | ICD-10-CM | POA: Diagnosis not present

## 2015-03-04 ENCOUNTER — Ambulatory Visit (INDEPENDENT_AMBULATORY_CARE_PROVIDER_SITE_OTHER): Payer: Commercial Managed Care - HMO | Admitting: Pharmacist

## 2015-03-04 ENCOUNTER — Other Ambulatory Visit: Payer: Self-pay | Admitting: Family Medicine

## 2015-03-04 ENCOUNTER — Telehealth: Payer: Self-pay | Admitting: Pharmacist

## 2015-03-04 DIAGNOSIS — R32 Unspecified urinary incontinence: Secondary | ICD-10-CM | POA: Insufficient documentation

## 2015-03-04 DIAGNOSIS — I480 Paroxysmal atrial fibrillation: Secondary | ICD-10-CM | POA: Diagnosis not present

## 2015-03-04 DIAGNOSIS — N39498 Other specified urinary incontinence: Secondary | ICD-10-CM

## 2015-03-04 LAB — POCT INR: INR: 2.3

## 2015-03-04 MED ORDER — INCONTINENCE BRIEF LARGE MISC: Status: DC

## 2015-03-04 NOTE — Patient Instructions (Signed)
Anticoagulation Dose Instructions as of 03/04/2015      Don [name] Tue Wed Thu Fri Sat   New Dose 4 mg 6 mg 4 mg 4 mg 4 mg 6 mg 4 mg    Description        Continue current warfarin dose of '4mg'$  take 1 tablet daily except on fridays and mondays take 1 and 1/2 tablets.

## 2015-03-04 NOTE — Telephone Encounter (Signed)
Patient asked for depends type pads.  He is history of prostate cancer and urinary incontinence.   Rx sent to [name] - this may not be covered by Medicare.

## 2015-03-30 ENCOUNTER — Other Ambulatory Visit: Payer: Self-pay | Admitting: Family Medicine

## 2015-03-31 ENCOUNTER — Other Ambulatory Visit: Payer: Self-pay | Admitting: Family Medicine

## 2015-04-01 NOTE — Telephone Encounter (Signed)
Last seen 01/29/15 Dr Miller  If approved route to nurse to call into [facility] 

## 2015-04-01 NOTE — Telephone Encounter (Signed)
Request refill 

## 2015-04-02 NOTE — Telephone Encounter (Signed)
Refill called to pharmacy.

## 2015-04-15 ENCOUNTER — Ambulatory Visit (INDEPENDENT_AMBULATORY_CARE_PROVIDER_SITE_OTHER): Payer: Commercial Managed Care - HMO | Admitting: Pharmacist

## 2015-04-15 DIAGNOSIS — I480 Paroxysmal atrial fibrillation: Secondary | ICD-10-CM | POA: Diagnosis not present

## 2015-04-15 LAB — POCT INR: INR: 2.2

## 2015-04-15 NOTE — Patient Instructions (Signed)
Anticoagulation Dose Instructions as of 04/15/2015      Don [name] Tue Wed Thu Fri Sat   New Dose 4 mg 6 mg 4 mg 4 mg 4 mg 6 mg 4 mg    Description        Continue current warfarin dose of '4mg'$  take 1 tablet daily except on fridays and mondays take 1 and 1/2 tablets.     INR was 2.3 today

## 2015-05-03 ENCOUNTER — Ambulatory Visit (INDEPENDENT_AMBULATORY_CARE_PROVIDER_SITE_OTHER): Payer: Commercial Managed Care - HMO | Admitting: Family Medicine

## 2015-05-03 ENCOUNTER — Encounter: Payer: Self-pay | Admitting: Family Medicine

## 2015-05-03 VITALS — BP 154/66 | HR 50 | Temp 96.9°F | Ht 68.0 in | Wt 167.0 lb

## 2015-05-03 DIAGNOSIS — I10 Essential (primary) hypertension: Secondary | ICD-10-CM

## 2015-05-03 DIAGNOSIS — J449 Chronic obstructive pulmonary disease, unspecified: Secondary | ICD-10-CM

## 2015-05-03 DIAGNOSIS — Z720 Tobacco use: Secondary | ICD-10-CM

## 2015-05-03 DIAGNOSIS — I480 Paroxysmal atrial fibrillation: Secondary | ICD-10-CM | POA: Diagnosis not present

## 2015-05-03 LAB — POCT INR: INR: 2.4

## 2015-05-03 NOTE — Progress Notes (Signed)
Subjective:    Patient ID: Don [name], male    DOB: 04-19-1941, 74 y.o.   MRN: [id number]  HPI  74 year old gentleman with history of atrial fibrillation hypertension, hyperlipidemia, and COPD. He complains today of some memory issues but again recalls what he ate yesterday I think it's probably normal for his age. He is also seen nephrologist for some chronic kidney disease. They apparently started lisinopril at that visit. Note that blood pressure is elevated today compared to last visit and he says he is taking his medicines as prescribed  Patient Active Problem List   Diagnosis Date Noted  . Urinary bladder incontinence 03/04/2015  . Paroxysmal atrial fibrillation 05/25/2014  . Hiatal hernia 04/06/2014  . Other and unspecified hyperlipidemia 04/04/2014  . Personal history of colonic polyps 04/04/2014  . History of prostate cancer 04/03/2014  . Stage III chronic kidney disease 04/03/2014  . Tenosynovitis of thumb 10/16/2013  . Atrial fibrillation 05/19/2013  . Tobacco abuse 04/25/2013  . Hernia 02/08/2011  . Prostate cancer 02/08/2011  . COPD (chronic obstructive pulmonary disease) 02/08/2011  . Hypertension 02/08/2011   Outpatient Encounter Prescriptions as of 05/03/2015  Medication Sig  . albuterol (PROVENTIL HFA;VENTOLIN HFA) 108 (90 BASE) MCG/ACT inhaler Inhale 2 puffs into the lungs every 6 (six) hours as needed for wheezing or shortness of breath.  [name] atorvastatin (LIPITOR) 20 MG tablet TAKE 1 TABLET DAILY  . clonazePAM (KLONOPIN) 1 MG tablet TAKE 1/2 TABLET ONCE DAILY AS NEEDED FOR ANXIETY  . digoxin (LANOXIN) 0.125 MG tablet TAKE 1 TABLET DAILY  . diltiazem (CARDIZEM CD) 300 MG 24 hr capsule TAKE (1) CAPSULE DAILY  . DiphenhydrAMINE HCl, Sleep, (SLEEP-AID MAXIMUM STRENGTH) 50 MG CAPS Take 1 capsule by mouth at bedtime.  . DULoxetine (CYMBALTA) 60 MG capsule TAKE (1) CAPSULE DAILY  . Incontinence Supply Disposable (INCONTINENCE BRIEF LARGE) MISC Use as needed for urinary  incontinence.  Dx: urinary incontinence R32 and prostate cancer C61  . ketotifen (ZADITOR) 0.025 % ophthalmic solution Place 2 drops into the left eye 2 (two) times daily.  . metoprolol tartrate (LOPRESSOR) 25 MG tablet TAKE (1) TABLET TWICE A DAY.  . Multiple Vitamin (MULTIVITAMIN WITH MINERALS) TABS Take 1 tablet by mouth daily.  . nitroGLYCERIN (NITROSTAT) 0.4 MG SL tablet Place 0.4 mg under the tongue every 5 (five) minutes as needed for chest pain.  [name] omega-3 acid ethyl esters (LOVAZA) 1 G capsule Take 2 capsules (2 g total) by mouth 2 (two) times daily.  . pantoprazole (PROTONIX) 40 MG tablet TAKE 1 TABLET DAILY  . Polyethyl Glycol-Propyl Glycol (SYSTANE OP) Place 1 drop into both eyes 2 (two) times daily as needed (dry eyes).   . terazosin (HYTRIN) 2 MG capsule TAKE 1 CAPSULE AT BEDTIME  . tobramycin (TOBREX) 0.3 % ophthalmic solution Place 2 drops into the right eye.   [name] Umeclidinium-Vilanterol (ANORO ELLIPTA) 62.5-25 MCG/INH AEPB Inhale 1 puff into the lungs daily.  [name] warfarin (COUMADIN) 4 MG tablet TAKE 1 TO 1&1/2 TABLETS DAILY AS DIRECTED BY CLINIC  . dextromethorphan-guaiFENesin (MUCINEX DM) 30-600 MG per 12 hr tablet Take 1 tablet by mouth 2 (two) times daily.  [name] lisinopril (PRINIVIL,ZESTRIL) 5 MG tablet    No facility-administered encounter medications on file as of 05/03/2015.       Review of Systems  Constitutional: Negative.   Respiratory: Negative.   Cardiovascular: Negative.   Genitourinary: Negative.   Neurological: Negative.   Psychiatric/Behavioral: Negative.        Objective:  Physical Exam  Constitutional: He is oriented to person, place, and time. He appears well-developed and well-nourished.  Cardiovascular: Normal rate and normal heart sounds.   Pulmonary/Chest: Effort normal and breath sounds normal.  Neurological: He is alert and oriented to person, place, and time.  Psychiatric: He has a normal mood and affect. His behavior is normal.    BP 154/66  mmHg  Pulse 50  Temp(Src) 96.9 F (36.1 C) (Oral)  Ht '5\' 8"'$  (1.727 m)  Wt 167 lb (75.751 kg)  BMI 25.40 kg/m2       Assessment & Plan:  1. Paroxysmal atrial fibrillation INR is 2.4. Continue same dose and also continue on Lanoxin and diltiazem for rate control - POCT INR  2. Essential hypertension Even though blood pressure is up continue with diltiazem and lisinopril  3. Chronic obstructive pulmonary disease, unspecified COPD, unspecified chronic bronchitis type He has 2 inhalers Symbicort as well as when necessary albuterol, the latter he only uses occasionally  4. Tobacco abuse  Wardell Honour MD

## 2015-05-03 NOTE — Patient Instructions (Addendum)
Blood Glucose Monitoring Monitoring your blood glucose (also know as blood sugar) helps you to manage your diabetes. It also helps you and your health care provider monitor your diabetes and determine how well your treatment plan is working. WHY SHOULD YOU MONITOR YOUR BLOOD GLUCOSE?  It can help you understand how food, exercise, and medicine affect your blood glucose.  It allows you to know what your blood glucose is at any given moment. You can quickly tell if you are having low blood glucose (hypoglycemia) or high blood glucose (hyperglycemia).  It can help you and your health care provider know how to adjust your medicines.  It can help you understand how to manage an illness or adjust medicine for exercise. WHEN SHOULD YOU TEST? Your health care provider will help you decide how often you should check your blood glucose. This may depend on the type of diabetes you have, your diabetes control, or the types of medicines you are taking. Be sure to write down all of your blood glucose readings so that this information can be reviewed with your health care provider. See below for examples of testing times that your health care provider may suggest. Type 1 Diabetes  Test 4 times a day if you are in good control, using an insulin pump, or perform multiple daily injections.  If your diabetes is not well controlled or if you are sick, you may need to monitor more often.  It is a good idea to also monitor:  Before and after exercise.  Between meals and 2 hours after a meal.  Occasionally between 2:00 a.m. and 3:00 a.m. Type 2 Diabetes  It can vary with each person, but generally, if you are on insulin, test 4 times a day.  If you take medicines by mouth (orally), test 2 times a day.  If you are on a controlled diet, test once a day.  If your diabetes is not well controlled or if you are sick, you may need to monitor more often. HOW TO MONITOR YOUR BLOOD GLUCOSE Supplies  Needed  Blood glucose meter.  Test strips for your meter. Each meter has its own strips. You must use the strips that go with your own meter.  A pricking needle (lancet).  A device that holds the lancet (lancing device).  A journal or log book to write down your results. Procedure  Wash your hands with soap and water. Alcohol is not preferred.  Prick the side of your finger (not the tip) with the lancet.  Gently milk the finger until a small drop of blood appears.  Follow the instructions that come with your meter for inserting the test strip, applying blood to the strip, and using your blood glucose meter. Other Areas to Get Blood for Testing Some meters allow you to use other areas of your body (other than your finger) to test your blood. These areas are called alternative sites. The most common alternative sites are:  The forearm.  The thigh.  The back area of the lower leg.  The palm of the hand. The blood flow in these areas is slower. Therefore, the blood glucose values you get may be delayed, and the numbers are different from what you would get from your fingers. Do not use alternative sites if you think you are having hypoglycemia. Your reading will not be accurate. Always use a finger if you are having hypoglycemia. Also, if you cannot feel your lows (hypoglycemia unawareness), always use your fingers for your  blood glucose checks. ADDITIONAL TIPS FOR GLUCOSE MONITORING  Do not reuse lancets.  Always carry your supplies with you.  All blood glucose meters have a 24-hour "hotline" number to call if you have questions or need help.  Adjust (calibrate) your blood glucose meter with a control solution after finishing a few boxes of strips. BLOOD GLUCOSE RECORD KEEPING It is a good idea to keep a daily record or log of your blood glucose readings. Most glucose meters, if not all, keep your glucose records stored in the meter. Some meters come with the ability to download  your records to your home computer. Keeping a record of your blood glucose readings is especially helpful if you are wanting to look for patterns. Make notes to go along with the blood glucose readings because you might forget what happened at that exact time. Keeping good records helps you and your health care provider to work together to achieve good diabetes management.  Document Released: 11/02/2003 Document Revised: 03/16/2014 Document Reviewed: 03/24/2013 [facility] Patient Information 2015 [place], [place]. This information is not intended to replace advice given to you by your health care provider. Make sure you discuss any questions you have with your health care provider.  Continue current warfarin dose of '4mg'$  take 1 tablet daily except on fridays and mondays take 1 and 1/2 tablets.

## 2015-05-04 ENCOUNTER — Telehealth: Payer: Self-pay | Admitting: *Deleted

## 2015-05-04 ENCOUNTER — Other Ambulatory Visit: Payer: Self-pay | Admitting: Family Medicine

## 2015-05-04 LAB — LIPID PANEL
CHOLESTEROL TOTAL: 113 mg/dL (ref 100–199)
Chol/HDL Ratio: 4.7 ratio units (ref 0.0–5.0)
HDL: 24 mg/dL — ABNORMAL LOW (ref >39)
LDL Calculated: 54 mg/dL (ref 0–99)
TRIGLYCERIDES: 177 mg/dL — AB (ref 0–149)
VLDL Cholesterol Cal: 35 mg/dL (ref 5–40)

## 2015-05-04 LAB — CMP14+EGFR
A/G RATIO: 1.9 (ref 1.1–2.5)
ALT: 14 IU/L (ref 0–44)
AST: 17 IU/L (ref 0–40)
Albumin: 3.6 g/dL (ref 3.5–4.8)
Alkaline Phosphatase: 93 IU/L (ref 39–117)
BILIRUBIN TOTAL: 0.3 mg/dL (ref 0.0–1.2)
BUN/Creatinine Ratio: 13 (ref 10–22)
BUN: 24 mg/dL (ref 8–27)
CO2: 25 mmol/L (ref 18–29)
Calcium: 8.9 mg/dL (ref 8.6–10.2)
Chloride: 103 mmol/L (ref 97–108)
Creatinine, Ser: 1.91 mg/dL — ABNORMAL HIGH (ref 0.76–1.27)
GFR, EST AFRICAN AMERICAN: 39 mL/min/{1.73_m2} — AB (ref >59)
GFR, EST NON AFRICAN AMERICAN: 34 mL/min/{1.73_m2} — AB (ref >59)
Globulin, Total: 1.9 g/dL (ref 1.5–4.5)
Glucose: 135 mg/dL — ABNORMAL HIGH (ref 65–99)
POTASSIUM: 5 mmol/L (ref 3.5–5.2)
Sodium: 142 mmol/L (ref 134–144)
Total Protein: 5.5 g/dL — ABNORMAL LOW (ref 6.0–8.5)

## 2015-05-04 NOTE — Telephone Encounter (Signed)
-----   Message from Wardell Honour, MD sent at 05/04/2015  8:15 AM EDT ----- Lipids are at goal so continue same dose of atorvastatin. Liver function tests are normal. There is some decline in renal function which is not new but it looks stable and sugars are slightly elevated but not as high as they have been in the past

## 2015-05-04 NOTE — Progress Notes (Signed)
Patient aware.

## 2015-05-14 ENCOUNTER — Other Ambulatory Visit: Payer: Self-pay

## 2015-06-01 ENCOUNTER — Other Ambulatory Visit: Payer: Self-pay | Admitting: Family Medicine

## 2015-06-02 NOTE — Telephone Encounter (Signed)
Last seen 05/03/15 Dr Sabra Heck  If approved route to nurse to call into [facility]

## 2015-06-02 NOTE — Telephone Encounter (Signed)
rx called to pharmacy 

## 2015-06-02 NOTE — Telephone Encounter (Signed)
rx called into pharmacy

## 2015-06-03 ENCOUNTER — Ambulatory Visit (INDEPENDENT_AMBULATORY_CARE_PROVIDER_SITE_OTHER): Payer: Commercial Managed Care - HMO | Admitting: Pharmacist

## 2015-06-03 ENCOUNTER — Encounter: Payer: Self-pay | Admitting: Pharmacist

## 2015-06-03 VITALS — BP 148/80 | HR 63

## 2015-06-03 DIAGNOSIS — I480 Paroxysmal atrial fibrillation: Secondary | ICD-10-CM | POA: Diagnosis not present

## 2015-06-03 DIAGNOSIS — R739 Hyperglycemia, unspecified: Secondary | ICD-10-CM | POA: Diagnosis not present

## 2015-06-03 DIAGNOSIS — I10 Essential (primary) hypertension: Secondary | ICD-10-CM

## 2015-06-03 LAB — POCT INR: INR: 2.1

## 2015-06-03 LAB — POCT GLYCOSYLATED HEMOGLOBIN (HGB A1C): Hemoglobin A1C: 6.1

## 2015-06-03 NOTE — Progress Notes (Signed)
Subjective:     Indication: atrial fibrillation Bleeding signs/symptoms: None Thromboembolic signs/symptoms: None  Missed Coumadin doses: None Medication changes: no Dietary changes: no Bacterial/viral infection: no Other concerns: yes - BG was elevated at last BMP check.  Patient has been checking BG at home.  Readings range from 87 to 128  The following portions of the patient's history were reviewed and updated as appropriate: allergies, current medications, past family history, past medical history, past social history, past surgical history and problem list.    Objective:    INR Today: 2.1 Current dose: warfarin '4mg'$  - take 1 tablet daily except 1.5 tablets on mondays and fridays   Assessment:    Therapeutic INR for goal of 2-3  Elevated BG - home BG and A1c are at goals  Plan:    1. New dose: no change   2. Next INR: 6 weeks    Cherre Robins, PharmD, CPP'

## 2015-06-03 NOTE — Addendum Note (Signed)
Addended by: Selmer Dominion on: 06/03/2015 04:21 PM   Modules accepted: Orders

## 2015-06-03 NOTE — Patient Instructions (Addendum)
Anticoagulation Dose Instructions as of 06/03/2015      [name] Tue Wed Thu Fri Sat   New Dose 4 mg 6 mg 4 mg 4 mg 4 mg 6 mg 4 mg    Description        Continue current warfarin dose of '4mg'$  take 1 tablet daily except on fridays and mondays take 1 and 1/2 tablets.     INR was 2.1 today   DASH Eating Plan DASH stands for "Dietary Approaches to Stop Hypertension." The DASH eating plan is a healthy eating plan that has been shown to reduce high blood pressure (hypertension). Additional health benefits may include reducing the risk of type 2 diabetes mellitus, heart disease, and stroke. The DASH eating plan may also help with weight loss. WHAT DO I NEED TO KNOW ABOUT THE DASH EATING PLAN? For the DASH eating plan, you will follow these general guidelines:  Choose foods with a percent daily value for sodium of less than 5% (as listed on the food label).  Use salt-free seasonings or herbs instead of table salt or sea salt.  Check with your health care provider or pharmacist before using salt substitutes.  Eat lower-sodium products, often labeled as "lower sodium" or "no salt added."  Eat fresh foods.  Eat more vegetables, fruits, and low-fat dairy products.  Choose whole grains. Look for the word "whole" as the first word in the ingredient list.  Choose fish and skinless chicken or [place] more often than red meat. Limit fish, poultry, and meat to 6 oz (170 g) each day.  Limit sweets, desserts, sugars, and sugary drinks.  Choose heart-healthy fats.  Limit cheese to 1 oz (28 g) per day.  Eat more home-cooked food and less restaurant, buffet, and fast food.  Limit fried foods.  Cook foods using methods other than frying.  Limit canned vegetables. If you do use them, rinse them well to decrease the sodium.  When eating at a restaurant, ask that your food be prepared with less salt, or no salt if possible. WHAT FOODS CAN I EAT? Seek help from a dietitian for individual calorie  needs. Grains Whole grain or whole wheat bread. Brown rice. Whole grain or whole wheat pasta. Quinoa, bulgur, and whole grain cereals. Low-sodium cereals. Corn or whole wheat flour tortillas. Whole grain cornbread. Whole grain crackers. Low-sodium crackers. Vegetables Fresh or frozen vegetables (raw, steamed, roasted, or grilled). Low-sodium or reduced-sodium tomato and vegetable juices. Low-sodium or reduced-sodium tomato sauce and paste. Low-sodium or reduced-sodium canned vegetables.  Fruits All fresh, canned (in natural juice), or frozen fruits. Meat and Other Protein Products Ground beef (85% or leaner), grass-fed beef, or beef trimmed of fat. Skinless chicken or [place]. Ground chicken or [place]. Pork trimmed of fat. All fish and seafood. Eggs. Dried beans, peas, or lentils. Unsalted nuts and seeds. Unsalted canned beans. Dairy Low-fat dairy products, such as skim or 1% milk, 2% or reduced-fat cheeses, low-fat ricotta or cottage cheese, or plain low-fat yogurt. Low-sodium or reduced-sodium cheeses. Fats and Oils Tub margarines without trans fats. Light or reduced-fat mayonnaise and salad dressings (reduced sodium). Avocado. Safflower, olive, or canola oils. Natural peanut or almond butter. Other Unsalted popcorn and pretzels. The items listed above may not be a complete list of recommended foods or beverages. Contact your dietitian for more options. WHAT FOODS ARE NOT RECOMMENDED? Grains White bread. White pasta. White rice. Refined cornbread. Bagels and croissants. Crackers that contain trans fat. Vegetables Creamed or fried vegetables. Vegetables in  a cheese sauce. Regular canned vegetables. Regular canned tomato sauce and paste. Regular tomato and vegetable juices. Fruits Dried fruits. Canned fruit in light or heavy syrup. Fruit juice. Meat and Other Protein Products Fatty cuts of meat. Ribs, chicken wings, bacon, sausage, bologna, salami, chitterlings, fatback, hot dogs, bratwurst,  and packaged luncheon meats. Salted nuts and seeds. Canned beans with salt. Dairy Whole or 2% milk, cream, half-and-half, and cream cheese. Whole-fat or sweetened yogurt. Full-fat cheeses or blue cheese. Nondairy creamers and whipped toppings. Processed cheese, cheese spreads, or cheese curds. Condiments Onion and garlic salt, seasoned salt, table salt, and sea salt. Canned and packaged gravies. Worcestershire sauce. Tartar sauce. Barbecue sauce. Teriyaki sauce. Soy sauce, including reduced sodium. Steak sauce. Fish sauce. Oyster sauce. Cocktail sauce. Horseradish. Ketchup and mustard. Meat flavorings and tenderizers. Bouillon cubes. Hot sauce. Tabasco sauce. Marinades. Taco seasonings. Relishes. Fats and Oils Butter, stick margarine, lard, shortening, ghee, and bacon fat. Coconut, palm kernel, or palm oils. Regular salad dressings. Other Pickles and olives. Salted popcorn and pretzels. The items listed above may not be a complete list of foods and beverages to avoid. Contact your dietitian for more information. WHERE CAN I FIND MORE INFORMATION? National Heart, [employer], and [employer] Institute: travelstabloid.com Document Released: 10/19/2011 Document Revised: 03/16/2014 Document Reviewed: 09/03/2013 [facility] Patient Information 2015 [place], [place]. This information is not intended to replace advice given to you by your health care provider. Make sure you discuss any questions you have with your health care provider.

## 2015-06-07 ENCOUNTER — Other Ambulatory Visit (INDEPENDENT_AMBULATORY_CARE_PROVIDER_SITE_OTHER): Payer: Commercial Managed Care - HMO

## 2015-06-07 DIAGNOSIS — R809 Proteinuria, unspecified: Secondary | ICD-10-CM | POA: Diagnosis not present

## 2015-06-07 DIAGNOSIS — D509 Iron deficiency anemia, unspecified: Secondary | ICD-10-CM | POA: Diagnosis not present

## 2015-06-07 DIAGNOSIS — N183 Chronic kidney disease, stage 3 unspecified: Secondary | ICD-10-CM

## 2015-06-07 DIAGNOSIS — Z79899 Other long term (current) drug therapy: Secondary | ICD-10-CM | POA: Diagnosis not present

## 2015-06-07 DIAGNOSIS — I10 Essential (primary) hypertension: Secondary | ICD-10-CM

## 2015-06-07 DIAGNOSIS — E559 Vitamin D deficiency, unspecified: Secondary | ICD-10-CM

## 2015-06-07 NOTE — Progress Notes (Signed)
Lab for Dr Lowanda Foster RENAL, H&H, IRON&ITBC, FERRITIN, PTH, VITAMIN D, PRO&CREAT URINE  DX: Z79.899, I10, N18.3, E55.9, R80.9, D50.9

## 2015-06-08 LAB — RENAL FUNCTION PANEL
ALBUMIN: 3.8 g/dL (ref 3.5–4.8)
BUN / CREAT RATIO: 15 (ref 10–22)
BUN: 29 mg/dL — AB (ref 8–27)
CHLORIDE: 107 mmol/L (ref 97–108)
CO2: 22 mmol/L (ref 18–29)
CREATININE: 1.9 mg/dL — AB (ref 0.76–1.27)
Calcium: 8.6 mg/dL (ref 8.6–10.2)
GFR calc Af Amer: 39 mL/min/{1.73_m2} — ABNORMAL LOW (ref >59)
GFR calc non Af Amer: 34 mL/min/{1.73_m2} — ABNORMAL LOW (ref >59)
Glucose: 104 mg/dL — ABNORMAL HIGH (ref 65–99)
PHOSPHORUS: 3.2 mg/dL (ref 2.5–4.5)
Potassium: 4.4 mmol/L (ref 3.5–5.2)
SODIUM: 144 mmol/L (ref 134–144)

## 2015-06-08 LAB — FERRITIN: Ferritin: 128 ng/mL (ref 30–400)

## 2015-06-08 LAB — IRON AND TIBC
IRON SATURATION: 16 % (ref 15–55)
Iron: 40 ug/dL (ref 38–169)
TIBC: 255 ug/dL (ref 250–450)
UIBC: 215 ug/dL (ref 111–343)

## 2015-06-08 LAB — PARATHYROID HORMONE, INTACT (NO CA): PTH: 84 pg/mL — AB (ref 15–65)

## 2015-06-08 LAB — HEMOGLOBIN: HEMOGLOBIN: 13.9 g/dL (ref 12.6–17.7)

## 2015-06-08 LAB — PROTEIN / CREATININE RATIO, URINE
Creatinine, Urine: 92.9 mg/dL
Protein, Ur: 111.5 mg/dL
Protein/Creat Ratio: 1200 mg/g creat — ABNORMAL HIGH (ref 0–200)

## 2015-06-08 LAB — HEMATOCRIT: HEMATOCRIT: 42 % (ref 37.5–51.0)

## 2015-06-08 LAB — VITAMIN D 25 HYDROXY (VIT D DEFICIENCY, FRACTURES): Vit D, 25-Hydroxy: 39.7 ng/mL (ref 30.0–100.0)

## 2015-06-09 DIAGNOSIS — I10 Essential (primary) hypertension: Secondary | ICD-10-CM | POA: Diagnosis not present

## 2015-06-09 DIAGNOSIS — N183 Chronic kidney disease, stage 3 (moderate): Secondary | ICD-10-CM | POA: Diagnosis not present

## 2015-06-09 DIAGNOSIS — R809 Proteinuria, unspecified: Secondary | ICD-10-CM | POA: Diagnosis not present

## 2015-06-09 DIAGNOSIS — N2581 Secondary hyperparathyroidism of renal origin: Secondary | ICD-10-CM | POA: Diagnosis not present

## 2015-06-16 ENCOUNTER — Other Ambulatory Visit: Payer: Self-pay

## 2015-06-16 MED ORDER — TOBRAMYCIN 0.3 % OP SOLN: 2.0000 [drp] | Freq: Four times a day (QID) | OPHTHALMIC | Status: DC

## 2015-06-16 NOTE — Telephone Encounter (Signed)
Last seen 6/16  Dr Sabra Heck

## 2015-06-30 ENCOUNTER — Other Ambulatory Visit: Payer: Self-pay | Admitting: Family Medicine

## 2015-07-23 ENCOUNTER — Ambulatory Visit (INDEPENDENT_AMBULATORY_CARE_PROVIDER_SITE_OTHER): Payer: Commercial Managed Care - HMO | Admitting: Pharmacist

## 2015-07-23 ENCOUNTER — Encounter: Payer: Self-pay | Admitting: Pharmacist

## 2015-07-23 ENCOUNTER — Ambulatory Visit: Payer: Self-pay

## 2015-07-23 VITALS — BP 142/70 | HR 69 | Ht 67.75 in | Wt 161.0 lb

## 2015-07-23 DIAGNOSIS — Z0000 Encounter for general adult medical examination without abnormal findings: Secondary | ICD-10-CM | POA: Diagnosis not present

## 2015-07-23 DIAGNOSIS — E8881 Metabolic syndrome: Secondary | ICD-10-CM | POA: Insufficient documentation

## 2015-07-23 DIAGNOSIS — Z23 Encounter for immunization: Secondary | ICD-10-CM | POA: Diagnosis not present

## 2015-07-23 DIAGNOSIS — R7303 Prediabetes: Secondary | ICD-10-CM | POA: Insufficient documentation

## 2015-07-23 DIAGNOSIS — I480 Paroxysmal atrial fibrillation: Secondary | ICD-10-CM

## 2015-07-23 LAB — POCT INR: INR: 1.9

## 2015-07-23 MED ORDER — LORATADINE 10 MG PO TABS: 10.0000 mg | ORAL_TABLET | Freq: Every evening | ORAL | Status: DC

## 2015-07-23 MED ORDER — PRO-BIOTIC BLEND PO CAPS: 1.0000 | ORAL_CAPSULE | Freq: Every day | ORAL | Status: DC

## 2015-07-23 NOTE — Patient Instructions (Addendum)
  [name] , Thank you for taking time to come for your Medicare Wellness Visit. I appreciate your ongoing commitment to your health goals. Please review the following plan we discussed and let me know if I can assist you in the future.   Try to do chair exercises daily - handout given Get probiotics at pharmacy - this will restore health bacteria to your digestive tract and should help with occasional diarrhea.  I have sent prescription to [facility] for loratidine '10mg'$  take 1 tablet daily - this is for allergies and should help with congestion and sinus headache    This is a list of the screening recommended for you and due dates:  Health Maintenance  Topic Date Due  . Tetanus Vaccine  08/01/1958  . Shingles Vaccine  08/02/1999  . Pneumonia vaccines (1 of 2 - PCV13) Received today  . Flu Shot  08/20/2015*  . Colon Cancer Screening  04/06/2019  *Topic was postponed. The date shown is not the original due date.     Anticoagulation Dose Instructions as of 07/23/2015      Don [name] Tue Wed Thu Fri Sat   New Dose 4 mg 6 mg 4 mg 4 mg 4 mg 6 mg 4 mg    Description        Take extra 1/2 tablet for 1 day, then continue current warfarin dose of '4mg'$  take 1 tablet daily except on fridays and mondays take 1 and 1/2 tablets.

## 2015-07-23 NOTE — Progress Notes (Signed)
Patient ID: Don [name], male   DOB: 1941/10/30, 74 y.o.   MRN: [id number]    Subjective:   Don [name] is a 74 y.o. male who presents for a Subsequent Medicare Annual Wellness Visit.  Don [name] is a white male who is married.  His wife currently resides in a nursing.  He lives at home by himself. He states the there is a lot of family stress regarding his wife being in a nursing home as her children feel that she should be at home.  Don [name] also c/o occasional diarrhea and sinus headaches which usually occur in the morning which he has some congestion.  Current Medications (verified) Outpatient Encounter Prescriptions as of 07/23/2015  Medication Sig  . atorvastatin (LIPITOR) 20 MG tablet TAKE 1 TABLET DAILY  . budesonide-formoterol (SYMBICORT) 160-4.5 MCG/ACT inhaler Inhale 2 puffs into the lungs 2 (two) times daily.  . clonazePAM (KLONOPIN) 1 MG tablet TAKE 1/2 TABLET ONCE DAILY AS NEEDED FOR ANXIETY  . digoxin (LANOXIN) 0.125 MG tablet TAKE 1 TABLET DAILY  . diltiazem (CARDIZEM CD) 300 MG 24 hr capsule TAKE (1) CAPSULE DAILY  . DiphenhydrAMINE HCl, Sleep, (SLEEP-AID MAXIMUM STRENGTH) 50 MG CAPS Take 1 capsule by mouth at bedtime.  . DULoxetine (CYMBALTA) 60 MG capsule TAKE (1) CAPSULE DAILY  . FERREX 150 150 MG capsule Take 1 capsule by mouth daily.  . Incontinence Supply Disposable (INCONTINENCE BRIEF LARGE) MISC Use as needed for urinary incontinence.  Dx: urinary incontinence R32 and prostate cancer C61  . ketotifen (ZADITOR) 0.025 % ophthalmic solution Place 2 drops into the left eye 2 (two) times daily.  [name] lisinopril (PRINIVIL,ZESTRIL) 5 MG tablet   . metoprolol tartrate (LOPRESSOR) 25 MG tablet TAKE (1) TABLET TWICE A DAY.  . Multiple Vitamin (MULTIVITAMIN WITH MINERALS) TABS Take 1 tablet by mouth daily.  . nitroGLYCERIN (NITROSTAT) 0.4 MG SL tablet Place 0.4 mg under the tongue every 5 (five) minutes as needed for chest pain.  [name] omega-3 acid ethyl esters (LOVAZA) 1 G  capsule Take 2 capsules (2 g total) by mouth 2 (two) times daily.  . pantoprazole (PROTONIX) 40 MG tablet TAKE 1 TABLET DAILY  . Polyethyl Glycol-Propyl Glycol (SYSTANE OP) Place 1 drop into both eyes 2 (two) times daily as needed (dry eyes).   . terazosin (HYTRIN) 2 MG capsule TAKE 1 CAPSULE AT BEDTIME  . tiotropium (SPIRIVA) 18 MCG inhalation capsule Place 18 mcg into inhaler and inhale daily.  [name] tobramycin (TOBREX) 0.3 % ophthalmic solution Place 2 drops into the right eye every 6 (six) hours.  [name] warfarin (COUMADIN) 4 MG tablet TAKE 1 TO 1&1/2 TABLETS DAILY AS DIRECTED BY CLINIC  . albuterol (PROVENTIL HFA;VENTOLIN HFA) 108 (90 BASE) MCG/ACT inhaler Inhale 2 puffs into the lungs every 6 (six) hours as needed for wheezing or shortness of breath. (Patient not taking: Reported on 07/23/2015)  . dextromethorphan-guaiFENesin (MUCINEX DM) 30-600 MG per 12 hr tablet Take 1 tablet by mouth 2 (two) times daily. (Patient not taking: Reported on 07/23/2015)  . loratadine (CLARITIN) 10 MG tablet Take 1 tablet (10 mg total) by mouth every evening. For allergies  . Probiotic Product (PRO-BIOTIC BLEND) CAPS Take 1 capsule by mouth daily.  . [DISCONTINUED] Umeclidinium-Vilanterol (ANORO ELLIPTA) 62.5-25 MCG/INH AEPB Inhale 1 puff into the lungs daily.   No facility-administered encounter medications on file as of 07/23/2015.    Allergies (verified) Wellbutrin   History: Past Medical History  Diagnosis Date  . Hyperlipidemia   .  Hypertension   . COPD (chronic obstructive pulmonary disease)   . Prostate cancer   . Tubular adenoma of colon 07/31/02, 11/18/03  . Noncompliance with medications 04/2013    xarelto, digoxin  . Tobacco abuse     ongoing  . Stage III chronic kidney disease 04/03/2014  . Acute bronchitis 04/03/2014  . Atrial fibrillation with RVR 04/03/2014   Past Surgical History  Procedure Laterality Date  . 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 Anal abcess,hemorroids,    .  Esophagogastroduodenoscopy  11/18/2003    Dr. Earle Gell  . Colonoscopy w/ biopsies  11/18/2003    Dr. Earle Gell  . Tonsillectomy    . Colonoscopy N/A 04/05/2014    Procedure: COLONOSCOPY;  Surgeon: Ladene Artist, MD;  Location: [facility];  Service: Endoscopy;  Laterality: N/A;  . Esophagogastroduodenoscopy N/A 04/05/2014    Procedure: ESOPHAGOGASTRODUODENOSCOPY (EGD);  Surgeon: Ladene Artist, MD;  Location: [facility];  Service: Endoscopy;  Laterality: N/A;   Family History  Problem Relation Age of Onset  . Diabetes Father   . Heart disease Father     MI  . Heart attack Father   . Heart attack Mother   . Heart disease Brother   . Cancer Brother   . Lung cancer Brother   . Heart disease Brother   . Lung cancer Sister   . Cancer Sister     lung  . Heart disease Brother    Social History   Occupational History  . retired    Social History Main Topics  . Smoking status: Current Every Day Smoker -- 1.00 packs/day    Types: Cigarettes, Cigars  . Smokeless tobacco: Never Used  . Alcohol Use: No  . Drug Use: No  . Sexual Activity: No    Do you feel safe at home?  Yes  Dietary issues and exercise activities: Current Exercise Habits:: The patient does not participate in regular exercise at present  Current Dietary habits:  Patient is not currently following any particular diet. He has lost about 6# over the last 2 months.   Objective:    Today's Vitals   07/23/15 2113  BP: 142/70  Pulse: 69  Height: 5' 7.75" (1.721 m)  Weight: 161 lb (73.029 kg)  PainSc: 2   PainLoc: Ankle   Body mass index is 24.66 kg/(m^2).   INR was 1.9 today  Activities of Daily Living In your present state of health, do you have any difficulty performing the following activities: 07/23/2015  Hearing? Y  Vision? Y  Difficulty concentrating or making decisions? Y  Walking or climbing stairs? N  Dressing or bathing? N  Doing errands, shopping? N  Preparing Food and eating ?  N  Using the Toilet? N  In the past six months, have you accidently leaked urine? N  Do you have problems with loss of bowel control? Y  Managing your Medications? N  Managing your Finances? N  Housekeeping or managing your Housekeeping? N    Are there smokers in your home (other than you)? No but patient continues to smoke and is not interested in quitting at this time   Cardiac Risk Factors include: advanced age (>75mn, >>36women);dyslipidemia;hypertension;male gender;smoking/ tobacco exposure  Depression Screen PHQ 2/9 Scores 07/23/2015 05/03/2015 10/07/2014 08/19/2014  PHQ - 2 Score 2 0 0 0  PHQ- 9 Score 4 - - -    Fall Risk Fall Risk  07/23/2015 05/03/2015 10/07/2014 08/19/2014 07/13/2014  Falls in the past year? No No  No No No  Number falls in past yr: - - - - -  Injury with Fall? - - - - -    Cognitive Function: MMSE - Mini Mental State Exam 07/23/2015  Orientation to time 5  Orientation to Place 5  Registration 3  Attention/ Calculation 3  Recall 3  Language- name 2 objects 2  Language- repeat 1  Language- follow 3 step command 3  Language- read & follow direction 1  Write a sentence 1  Copy design 1  Total score 28    Immunizations and Health Maintenance Immunization History  Administered Date(s) Administered  . Pneumococcal Conjugate-13 07/23/2015   Health Maintenance Due  Topic Date Due  . TETANUS/TDAP  08/01/1958  . ZOSTAVAX  08/02/1999    Patient Care Team: Wardell Honour, MD as PCP - General (Family Medicine) Fran Lowes, MD as Consulting Physician (Nephrology) Okey Regal, OD (Optometry) Sherlynn Stalls, MD as Consulting Physician (Ophthalmology)  Indicate any recent Medical Services you may have received from other than [facility] providers in the past year (date may be approximate).    Assessment:    Annual Wellness Visit  Slightly subtherapeutic anticogaulation HTN Prediabetes / elevated triglycerides   Screening Tests Health Maintenance    Topic Date Due  . TETANUS/TDAP  08/01/1958  . ZOSTAVAX  08/02/1999  . INFLUENZA VACCINE  08/20/2015 (Originally 06/14/2015)  . PNA vac Low Risk Adult (2 of 2 - PPSV23) 07/22/2016  . COLONOSCOPY  04/06/2019        Plan:   During the course of the visit Don [name] was educated and counseled about the following appropriate screening and preventive services:   Vaccines to include Pneumoccal, Influenza, Hepatitis B, Td, Zostavax. Received Prevnar 13 in office today.  Reminded patient to get influcenza vaccines and he states that he does not usually get vaccine.  I explained benefits and he will consider.  Declined Zostavax and Tdap vaccines  Colorectal cancer screening - colonoscopy UTD.  Given FOBT in office today  Cardiovascular disease screening - EKG and ECHO UTD.  BP slightly elevated today in office but has not taken BP meds yet today.  Last lipid panel showed elevated triglycerides - discussed ways to improve through diet changes  Diabetes screening - last A1c was WNL 6.1% 06/03/2015  Glaucoma screening /  Eye Exam - UTD  Nutrition counseling - Discussed healthy foods to maintain weight.  Also discussed limiting salt intake and CHOs.    Smoking cessation counseling - patient declined but reminded of the benefits of smoking cessaton  Advanced Directives - Discussed and information on Living will given to patient  Increase physical activity - given handout of chair exercise examples  Start Probiotic QD  Start claritin '10mg'$  once daily  Anticoagulation Dose Instructions as of 07/23/2015      [name] Tue Wed Thu Fri Sat   New Dose 4 mg 6 mg 4 mg 4 mg 4 mg 6 mg 4 mg    Description        Take extra 1/2 tablet for 1 day, then continue current warfarin dose of '4mg'$  take 1 tablet daily except on fridays and mondays take 1 and 1/2 tablets.       Patient Instructions (the written plan) were given to the patient.   Cherre Robins, [facility]   07/23/2015

## 2015-07-27 ENCOUNTER — Other Ambulatory Visit: Payer: Self-pay | Admitting: Family Medicine

## 2015-07-28 ENCOUNTER — Other Ambulatory Visit: Payer: Self-pay | Admitting: Family Medicine

## 2015-07-29 NOTE — Telephone Encounter (Signed)
Last seen 05/03/15  Dr Sabra Heck   If approved route to nurse to call into [facility]

## 2015-07-29 NOTE — Telephone Encounter (Signed)
Refill called to pharmacy.

## 2015-08-11 ENCOUNTER — Ambulatory Visit (INDEPENDENT_AMBULATORY_CARE_PROVIDER_SITE_OTHER): Payer: Commercial Managed Care - HMO | Admitting: Family Medicine

## 2015-08-11 ENCOUNTER — Encounter: Payer: Self-pay | Admitting: Family Medicine

## 2015-08-11 VITALS — BP 146/75 | HR 46 | Temp 97.0°F | Ht 67.75 in | Wt 160.0 lb

## 2015-08-11 DIAGNOSIS — I480 Paroxysmal atrial fibrillation: Secondary | ICD-10-CM

## 2015-08-11 DIAGNOSIS — I10 Essential (primary) hypertension: Secondary | ICD-10-CM | POA: Diagnosis not present

## 2015-08-11 DIAGNOSIS — K219 Gastro-esophageal reflux disease without esophagitis: Secondary | ICD-10-CM

## 2015-08-11 DIAGNOSIS — L989 Disorder of the skin and subcutaneous tissue, unspecified: Secondary | ICD-10-CM | POA: Diagnosis not present

## 2015-08-11 DIAGNOSIS — J449 Chronic obstructive pulmonary disease, unspecified: Secondary | ICD-10-CM

## 2015-08-11 DIAGNOSIS — R7303 Prediabetes: Secondary | ICD-10-CM

## 2015-08-11 DIAGNOSIS — R7309 Other abnormal glucose: Secondary | ICD-10-CM | POA: Diagnosis not present

## 2015-08-11 DIAGNOSIS — B078 Other viral warts: Secondary | ICD-10-CM | POA: Diagnosis not present

## 2015-08-11 NOTE — Addendum Note (Signed)
Addended by: Zannie Cove on: 08/11/2015 11:47 AM   Modules accepted: Orders

## 2015-08-11 NOTE — Progress Notes (Signed)
Subjective:    Patient ID: Don [name], male    DOB: [birth year], 74 y.o.   MRN: [id number]  HPI Pt here for follow up and management of chronic medical problems which includes atrial fibrillation, HTN, GERD, and COPD. He is taking medications regularly. He really has 3 concerns today. First he questions if he can take MiraLAX regularly. I explained that this would be a good idea and that he can get it over-the-counter. He is concerned about skin lesion on his left hand that's been there for may be as long as a year. It will not heal. His skin shows lots of sun damage throughout as well as some vitiligo. He has been treated for skin cancers before by dermatology. He continues with left ankle pain. He apparently sprained the ankle in the past. He wears an elastic sleeve over the area.       Patient Active Problem List   Diagnosis Date Noted  . Pre-diabetes 07/23/2015  . Metabolic syndrome 69/45/0388  . Urinary bladder incontinence 03/04/2015  . Paroxysmal atrial fibrillation 05/25/2014  . Hiatal hernia 04/06/2014  . Other and unspecified hyperlipidemia 04/04/2014  . Personal history of colonic polyps 04/04/2014  . History of prostate cancer 04/03/2014  . Stage III chronic kidney disease 04/03/2014  . Tenosynovitis of thumb 10/16/2013  . Atrial fibrillation 05/19/2013  . Tobacco abuse 04/25/2013  . Hernia 02/08/2011  . Prostate cancer 02/08/2011  . COPD (chronic obstructive pulmonary disease) 02/08/2011  . Hypertension 02/08/2011   Outpatient Encounter Prescriptions as of 08/11/2015  Medication Sig  . albuterol (PROVENTIL HFA;VENTOLIN HFA) 108 (90 BASE) MCG/ACT inhaler Inhale 2 puffs into the lungs every 6 (six) hours as needed for wheezing or shortness of breath.  [name] atorvastatin (LIPITOR) 20 MG tablet TAKE 1 TABLET DAILY  . budesonide-formoterol (SYMBICORT) 160-4.5 MCG/ACT inhaler Inhale 2 puffs into the lungs 2 (two) times daily.  . clonazePAM (KLONOPIN) 1 MG tablet TAKE 1/2  TABLET ONCE DAILY AS NEEDED FOR ANXIETY  . dextromethorphan-guaiFENesin (MUCINEX DM) 30-600 MG per 12 hr tablet Take 1 tablet by mouth 2 (two) times daily.  . digoxin (LANOXIN) 0.125 MG tablet TAKE 1 TABLET DAILY  . diltiazem (CARDIZEM CD) 300 MG 24 hr capsule TAKE (1) CAPSULE DAILY  . DiphenhydrAMINE HCl, Sleep, (SLEEP-AID MAXIMUM STRENGTH) 50 MG CAPS Take 1 capsule by mouth at bedtime.  . DULoxetine (CYMBALTA) 60 MG capsule TAKE (1) CAPSULE DAILY  . FERREX 150 150 MG capsule Take 1 capsule by mouth daily.  . Incontinence Supply Disposable (INCONTINENCE BRIEF LARGE) MISC Use as needed for urinary incontinence.  Dx: urinary incontinence R32 and prostate cancer C61  . ketotifen (ZADITOR) 0.025 % ophthalmic solution Place 2 drops into the left eye 2 (two) times daily.  [name] lisinopril (PRINIVIL,ZESTRIL) 5 MG tablet   . loratadine (CLARITIN) 10 MG tablet Take 1 tablet (10 mg total) by mouth every evening. For allergies  . metoprolol tartrate (LOPRESSOR) 25 MG tablet TAKE (1) TABLET TWICE A DAY.  . Multiple Vitamin (MULTIVITAMIN WITH MINERALS) TABS Take 1 tablet by mouth daily.  . nitroGLYCERIN (NITROSTAT) 0.4 MG SL tablet Place 0.4 mg under the tongue every 5 (five) minutes as needed for chest pain.  [name] omega-3 acid ethyl esters (LOVAZA) 1 G capsule TAKE (2) CAPSULES TWICE DAILY.  . pantoprazole (PROTONIX) 40 MG tablet TAKE 1 TABLET DAILY  . Polyethyl Glycol-Propyl Glycol (SYSTANE OP) Place 1 drop into both eyes 2 (two) times daily as needed (dry eyes).   [name]  Probiotic Product (PRO-BIOTIC BLEND) CAPS Take 1 capsule by mouth daily.  [name] terazosin (HYTRIN) 2 MG capsule TAKE 1 CAPSULE AT BEDTIME  . tiotropium (SPIRIVA) 18 MCG inhalation capsule Place 18 mcg into inhaler and inhale daily.  [name] tobramycin (TOBREX) 0.3 % ophthalmic solution Place 2 drops into the right eye every 6 (six) hours.  [name] warfarin (COUMADIN) 4 MG tablet TAKE 1 TO 1&1/2 TABLETS DAILY AS DIRECTED BY CLINIC   No facility-administered  encounter medications on file as of 08/11/2015.      Review of Systems  Constitutional: Negative.   HENT: Negative.   Eyes: Negative.   Respiratory: Negative.   Cardiovascular: Negative.   Gastrointestinal: Negative.   Endocrine: Negative.   Genitourinary: Negative.   Musculoskeletal: Positive for arthralgias (continued left ankle pain).  Skin: Negative.        Left hand skin lesion  Allergic/Immunologic: Negative.   Neurological: Negative.   Hematological: Negative.   Psychiatric/Behavioral: Negative.        Objective:   Physical Exam  Constitutional: He is oriented to person, place, and time. He appears well-developed and well-nourished.  Cardiovascular: Normal rate and normal heart sounds.   Pulmonary/Chest: Effort normal and breath sounds normal.  Neurological: He is alert and oriented to person, place, and time.  Skin:  Lots of sun damage left arm greater than right area of concern on the dorsum of the left hand was biopsied today and will be sent for pathology inspection   BP 146/75 mmHg  Pulse 46  Temp(Src) 97 F (36.1 C) (Oral)  Ht 5' 7.75" (1.721 m)  Wt 160 lb (72.576 kg)  BMI 24.50 kg/m2        Assessment & Plan:  1. Essential hypertension Blood pressure is fairly well controlled at 146/75. Medications include diltiazem, Hytrin,.  2. Chronic obstructive pulmonary disease, unspecified COPD, unspecified chronic bronchitis type No complaints today. Medications include Spiriva and Symbicort  3. Gastroesophageal reflux disease, esophagitis presence not specified He has learned what he should eat and not eat and takes no PPI regularly  4. Paroxysmal atrial fibrillation Rate is controlled on Lanoxin and diltiazem.  5. Pre-diabetes Last A1c was 6.1 and glucose was 104.

## 2015-08-11 NOTE — Patient Instructions (Signed)
Medicare Annual Wellness Visit  [facility] and the medical providers at [facility] strive to bring you the best medical care.  In doing so we not only want to address your current medical conditions and concerns but also to detect new conditions early and prevent illness, disease and health-related problems.    Medicare offers a yearly Wellness Visit which allows our clinical staff to assess your need for preventative services including immunizations, lifestyle education, counseling to decrease risk of preventable diseases and screening for fall risk and other medical concerns.    This visit is provided free of charge (no copay) for all Medicare recipients. The clinical pharmacists at [facility] have begun to conduct these Wellness Visits which will also include a thorough review of all your medications.    As you primary medical provider recommend that you make an appointment for your Annual Wellness Visit if you have not done so already this year.  You may set up this appointment before you leave today or you may call back ([phone]) and schedule an appointment.  Please make sure when you call that you mention that you are scheduling your Annual Wellness Visit with the clinical pharmacist so that the appointment may be made for the proper length of time.     Continue current medications. Continue good therapeutic lifestyle changes which include good diet and exercise. Fall precautions discussed with patient. If an FOBT was given today- please return it to our front desk. If you are over 37 years old - you may need Prevnar 33 or the adult Pneumonia vaccine.  **Flu shots will be available soon--- please call and schedule a FLU-CLINIC appointment**  After your visit with [place] today you will receive a survey in the mail or online from [employer] regarding your care with [place]. Please take a moment to fill this out. Your feedback is  very important to [place] as you can help [place] better understand your patient needs as well as improve your experience and satisfaction. WE CARE ABOUT YOU!!!   **Please join [place] SEPT.[id number], 2016 from 5:00 to 7:00pm for our OPEN HOUSE! Come out and meet our NEW providers**

## 2015-08-13 LAB — PATHOLOGY

## 2015-09-02 ENCOUNTER — Other Ambulatory Visit: Payer: Self-pay | Admitting: Family Medicine

## 2015-09-02 ENCOUNTER — Ambulatory Visit (INDEPENDENT_AMBULATORY_CARE_PROVIDER_SITE_OTHER): Payer: Commercial Managed Care - HMO | Admitting: Pharmacist

## 2015-09-02 VITALS — BP 138/64 | HR 66

## 2015-09-02 DIAGNOSIS — I480 Paroxysmal atrial fibrillation: Secondary | ICD-10-CM

## 2015-09-02 LAB — POCT INR: INR: 2.2

## 2015-09-02 NOTE — Patient Instructions (Signed)
Anticoagulation Dose Instructions as of 09/02/2015      [name] Tue Wed Thu Fri Sat   New Dose 4 mg 6 mg 4 mg 4 mg 4 mg 6 mg 4 mg    Description        Continue current warfarin dose of '4mg'$  take 1 tablet daily except on fridays and mondays take 1 and 1/2 tablets.     INR was 2.2 today

## 2015-09-03 ENCOUNTER — Encounter: Payer: Self-pay | Admitting: Pharmacist

## 2015-09-09 ENCOUNTER — Encounter: Payer: Self-pay | Admitting: Family Medicine

## 2015-09-09 ENCOUNTER — Ambulatory Visit (INDEPENDENT_AMBULATORY_CARE_PROVIDER_SITE_OTHER): Payer: Commercial Managed Care - HMO | Admitting: Family Medicine

## 2015-09-09 VITALS — BP 132/77 | HR 51 | Temp 97.9°F | Ht 67.75 in | Wt 165.2 lb

## 2015-09-09 DIAGNOSIS — L859 Epidermal thickening, unspecified: Secondary | ICD-10-CM | POA: Diagnosis not present

## 2015-09-09 MED ORDER — TRIAMCINOLONE ACETONIDE 0.1 % EX CREA: 1.0000 "application " | TOPICAL_CREAM | Freq: Two times a day (BID) | CUTANEOUS | Status: DC

## 2015-09-09 MED ORDER — BUDESONIDE-FORMOTEROL FUMARATE 160-4.5 MCG/ACT IN AERO: 2.0000 | INHALATION_SPRAY | Freq: Two times a day (BID) | RESPIRATORY_TRACT | Status: DC

## 2015-09-09 MED ORDER — TIOTROPIUM BROMIDE MONOHYDRATE 18 MCG IN CAPS: 18.0000 ug | ORAL_CAPSULE | Freq: Every day | RESPIRATORY_TRACT | Status: DC

## 2015-09-09 NOTE — Progress Notes (Signed)
BP 132/77 mmHg  Pulse 51  Temp(Src) 97.9 F (36.6 C) (Oral)  Ht 5' 7.75" (1.721 m)  Wt 165 lb 3.2 oz (74.934 kg)  BMI 25.30 kg/m2   Subjective:    Patient ID: Don [name], male    DOB: Apr 23, 1941, 74 y.o.   MRN: [id number]  HPI: Don [name] is a 73 y.o. male presenting on 09/09/2015 for Lesion on left hand   HPI Skin lesion Patient has been having recurrent skin lesion on the back of his hand is a hard raised keratotic lesion. White nature. Biopsy was taken of the site was found to be verrucous hyperkeratosis. He says it improves with an over-the-counter steroid but then returns. The skin lesion has been unchanged except for the coming in going.  Relevant past medical, surgical, family and social history reviewed and updated as indicated. Interim medical history since our last visit reviewed. Allergies and medications reviewed and updated.  Review of Systems  Constitutional: Negative for fever.  HENT: Negative for ear discharge and ear pain.   Eyes: Negative for discharge and visual disturbance.  Respiratory: Negative for shortness of breath and wheezing.   Cardiovascular: Negative for chest pain and leg swelling.  Gastrointestinal: Negative for abdominal pain, diarrhea and constipation.  Genitourinary: Negative for difficulty urinating.  Musculoskeletal: Negative for back pain and gait problem.  Skin: Positive for rash.  Neurological: Negative for syncope, light-headedness and headaches.  All other systems reviewed and are negative.   Per HPI unless specifically indicated above     Medication List       This list is accurate as of: 09/09/15 11:25 AM.  Always use your most recent med list.               albuterol 108 (90 BASE) MCG/ACT inhaler  Commonly known as:  PROVENTIL HFA;VENTOLIN HFA  Inhale 2 puffs into the lungs every 6 (six) hours as needed for wheezing or shortness of breath.     atorvastatin 20 MG tablet  Commonly known as:  LIPITOR  TAKE 1  TABLET DAILY     budesonide-formoterol 160-4.5 MCG/ACT inhaler  Commonly known as:  SYMBICORT  Inhale 2 puffs into the lungs 2 (two) times daily.     clonazePAM 1 MG tablet  Commonly known as:  KLONOPIN  TAKE 1/2 TABLET ONCE DAILY AS NEEDED FOR ANXIETY     digoxin 0.125 MG tablet  Commonly known as:  LANOXIN  TAKE 1 TABLET DAILY     diltiazem 300 MG 24 hr capsule  Commonly known as:  CARDIZEM CD  TAKE (1) CAPSULE DAILY     DULoxetine 60 MG capsule  Commonly known as:  CYMBALTA  TAKE (1) CAPSULE DAILY     FERREX 150 150 MG capsule  Generic drug:  iron polysaccharides  Take 1 capsule by mouth daily.     Incontinence Brief Large Misc  Use as needed for urinary incontinence.  Dx: urinary incontinence R32 and prostate cancer C61     ketotifen 0.025 % ophthalmic solution  Commonly known as:  ZADITOR  Place 2 drops into the left eye 2 (two) times daily.     lisinopril 5 MG tablet  Commonly known as:  PRINIVIL,ZESTRIL     loratadine 10 MG tablet  Commonly known as:  CLARITIN  Take 1 tablet (10 mg total) by mouth every evening. For allergies     metoprolol tartrate 25 MG tablet  Commonly known as:  LOPRESSOR  TAKE (1) TABLET TWICE  A DAY.     multivitamin with minerals Tabs tablet  Take 1 tablet by mouth daily.     nitroGLYCERIN 0.4 MG SL tablet  Commonly known as:  NITROSTAT  Place 0.4 mg under the tongue every 5 (five) minutes as needed for chest pain.     omega-3 acid ethyl esters 1 G capsule  Commonly known as:  LOVAZA  TAKE (2) CAPSULES TWICE DAILY.     pantoprazole 40 MG tablet  Commonly known as:  PROTONIX  TAKE 1 TABLET DAILY     PRO-BIOTIC BLEND Caps  Take 1 capsule by mouth daily.     SLEEP-AID MAXIMUM STRENGTH 50 MG Caps  Generic drug:  DiphenhydrAMINE HCl (Sleep)  Take 1 capsule by mouth at bedtime.     SYSTANE OP  Place 1 drop into both eyes 2 (two) times daily as needed (dry eyes).     terazosin 2 MG capsule  Commonly known as:  HYTRIN  TAKE  1 CAPSULE AT BEDTIME     tiotropium 18 MCG inhalation capsule  Commonly known as:  SPIRIVA  Place 1 capsule (18 mcg total) into inhaler and inhale daily.     tobramycin 0.3 % ophthalmic solution  Commonly known as:  TOBREX  Place 2 drops into the right eye every 6 (six) hours.     triamcinolone cream 0.1 %  Commonly known as:  KENALOG  Apply 1 application topically 2 (two) times daily.     warfarin 4 MG tablet  Commonly known as:  COUMADIN  TAKE 1 TO 1&1/2 TABLETS DAILY AS DIRECTED BY CLINIC           Objective:    BP 132/77 mmHg  Pulse 51  Temp(Src) 97.9 F (36.6 C) (Oral)  Ht 5' 7.75" (1.721 m)  Wt 165 lb 3.2 oz (74.934 kg)  BMI 25.30 kg/m2  Wt Readings from Last 3 Encounters:  09/09/15 165 lb 3.2 oz (74.934 kg)  08/11/15 160 lb (72.576 kg)  07/23/15 161 lb (73.029 kg)    Physical Exam  Constitutional: He is oriented to person, place, and time. He appears well-developed and well-nourished. No distress.  Eyes: Conjunctivae and EOM are normal. Pupils are equal, round, and reactive to light. Right eye exhibits no discharge. No scleral icterus.  Cardiovascular: Normal rate, regular rhythm, normal heart sounds and intact distal pulses.   No murmur heard. Pulmonary/Chest: Effort normal and breath sounds normal. No respiratory distress. He has no wheezes.  Musculoskeletal: Normal range of motion. He exhibits no edema.  Neurological: He is alert and oriented to person, place, and time. Coordination normal.  Skin: Skin is warm and dry. Rash (keratotic raised lesion on the dorsum of his left hand. It has come and gone.He has had a biopsy of the same area) noted. He is not diaphoretic.  Psychiatric: He has a normal mood and affect. His behavior is normal.  Vitals reviewed.   Results for orders placed or performed in visit on 09/02/15  POCT INR  Result Value Ref Range   INR 2.2       Assessment & Plan:   Problem List Items Addressed This Visit    None    Visit  Diagnoses    Hyperkeratosis of skin    -  Primary    Attempt to reduce with triamcinolone cream. If not we will increase with cryotherapy.    Relevant Medications    triamcinolone cream (KENALOG) 0.1 %        Follow up  plan: Return if symptoms worsen or fail to improve.  Caryl Pina, MD [facility] 09/09/2015, 11:25 AM

## 2015-09-16 ENCOUNTER — Other Ambulatory Visit (INDEPENDENT_AMBULATORY_CARE_PROVIDER_SITE_OTHER): Payer: Commercial Managed Care - HMO

## 2015-09-16 DIAGNOSIS — D509 Iron deficiency anemia, unspecified: Secondary | ICD-10-CM

## 2015-09-16 DIAGNOSIS — R809 Proteinuria, unspecified: Secondary | ICD-10-CM | POA: Diagnosis not present

## 2015-09-16 DIAGNOSIS — D649 Anemia, unspecified: Secondary | ICD-10-CM

## 2015-09-16 DIAGNOSIS — N183 Chronic kidney disease, stage 3 unspecified: Secondary | ICD-10-CM

## 2015-09-16 DIAGNOSIS — I10 Essential (primary) hypertension: Secondary | ICD-10-CM | POA: Diagnosis not present

## 2015-09-16 DIAGNOSIS — Z79899 Other long term (current) drug therapy: Secondary | ICD-10-CM

## 2015-09-16 DIAGNOSIS — E559 Vitamin D deficiency, unspecified: Secondary | ICD-10-CM | POA: Diagnosis not present

## 2015-09-16 NOTE — Progress Notes (Signed)
Labs for dr. Lowanda Foster

## 2015-09-17 LAB — IRON AND TIBC
IRON SATURATION: 28 % (ref 15–55)
IRON: 66 ug/dL (ref 38–169)
TIBC: 234 ug/dL — AB (ref 250–450)
UIBC: 168 ug/dL (ref 111–343)

## 2015-09-17 LAB — PROTEIN / CREATININE RATIO, URINE
Creatinine, Urine: 118.1 mg/dL
PROTEIN UR: 155.3 mg/dL
PROTEIN/CREAT RATIO: 1315 mg/g{creat} — AB (ref 0–200)

## 2015-09-17 LAB — RENAL FUNCTION PANEL
Albumin: 3.6 g/dL (ref 3.5–4.8)
BUN / CREAT RATIO: 15 (ref 10–22)
BUN: 28 mg/dL — AB (ref 8–27)
CALCIUM: 8.8 mg/dL (ref 8.6–10.2)
CHLORIDE: 105 mmol/L (ref 97–106)
CO2: 25 mmol/L (ref 18–29)
Creatinine, Ser: 1.89 mg/dL — ABNORMAL HIGH (ref 0.76–1.27)
GFR calc non Af Amer: 34 mL/min/{1.73_m2} — ABNORMAL LOW (ref >59)
GFR, EST AFRICAN AMERICAN: 39 mL/min/{1.73_m2} — AB (ref >59)
GLUCOSE: 103 mg/dL — AB (ref 65–99)
POTASSIUM: 4.3 mmol/L (ref 3.5–5.2)
Phosphorus: 3 mg/dL (ref 2.5–4.5)
SODIUM: 145 mmol/L — AB (ref 136–144)

## 2015-09-17 LAB — FERRITIN: Ferritin: 117 ng/mL (ref 30–400)

## 2015-09-17 LAB — VITAMIN D 25 HYDROXY (VIT D DEFICIENCY, FRACTURES): Vit D, 25-Hydroxy: 41.7 ng/mL (ref 30.0–100.0)

## 2015-09-17 LAB — HEMATOCRIT: Hematocrit: 40.4 % (ref 37.5–51.0)

## 2015-09-17 LAB — HEMOGLOBIN: HEMOGLOBIN: 13.7 g/dL (ref 12.6–17.7)

## 2015-09-17 LAB — PARATHYROID HORMONE, INTACT (NO CA): PTH: 96 pg/mL — AB (ref 15–65)

## 2015-09-20 ENCOUNTER — Other Ambulatory Visit: Payer: Self-pay | Admitting: *Deleted

## 2015-09-20 MED ORDER — LISINOPRIL 5 MG PO TABS: 5.0000 mg | ORAL_TABLET | Freq: Every day | ORAL | Status: DC

## 2015-09-22 DIAGNOSIS — D649 Anemia, unspecified: Secondary | ICD-10-CM | POA: Diagnosis not present

## 2015-09-22 DIAGNOSIS — N183 Chronic kidney disease, stage 3 (moderate): Secondary | ICD-10-CM | POA: Diagnosis not present

## 2015-09-22 DIAGNOSIS — R809 Proteinuria, unspecified: Secondary | ICD-10-CM | POA: Diagnosis not present

## 2015-09-22 DIAGNOSIS — I10 Essential (primary) hypertension: Secondary | ICD-10-CM | POA: Diagnosis not present

## 2015-09-28 ENCOUNTER — Other Ambulatory Visit: Payer: Self-pay | Admitting: Family Medicine

## 2015-09-29 NOTE — Telephone Encounter (Signed)
Last seen 09/09/15  Dr Dettinger  If approved route to nurse to call into [facility]

## 2015-09-29 NOTE — Telephone Encounter (Signed)
Will print refill Caryl Pina, MD [facility] 09/29/2015, 11:42 AM

## 2015-10-12 ENCOUNTER — Ambulatory Visit (INDEPENDENT_AMBULATORY_CARE_PROVIDER_SITE_OTHER): Payer: Commercial Managed Care - HMO | Admitting: Pharmacist Clinician (PhC)/ Clinical Pharmacy Specialist

## 2015-10-12 DIAGNOSIS — I480 Paroxysmal atrial fibrillation: Secondary | ICD-10-CM

## 2015-10-12 LAB — POCT INR: INR: 2.6

## 2015-10-12 NOTE — Patient Instructions (Signed)
Anticoagulation Dose Instructions as of 10/12/2015      [name] Tue Wed Thu Fri Sat   New Dose 4 mg 6 mg 4 mg 4 mg 4 mg 6 mg 4 mg    Description        Continue current warfarin dose of '4mg'$  take 1 tablet daily except on fridays and mondays take 1 and 1/2 tablets.

## 2015-10-25 ENCOUNTER — Other Ambulatory Visit: Payer: Self-pay | Admitting: Family Medicine

## 2015-10-27 ENCOUNTER — Other Ambulatory Visit: Payer: Self-pay | Admitting: Family Medicine

## 2015-11-02 ENCOUNTER — Other Ambulatory Visit: Payer: Self-pay | Admitting: Family Medicine

## 2015-11-03 DIAGNOSIS — F3341 Major depressive disorder, recurrent, in partial remission: Secondary | ICD-10-CM | POA: Diagnosis not present

## 2015-11-03 DIAGNOSIS — Z720 Tobacco use: Secondary | ICD-10-CM | POA: Diagnosis not present

## 2015-11-03 DIAGNOSIS — R7309 Other abnormal glucose: Secondary | ICD-10-CM | POA: Diagnosis not present

## 2015-11-03 DIAGNOSIS — Z0000 Encounter for general adult medical examination without abnormal findings: Secondary | ICD-10-CM | POA: Diagnosis not present

## 2015-11-03 DIAGNOSIS — I4891 Unspecified atrial fibrillation: Secondary | ICD-10-CM | POA: Diagnosis not present

## 2015-11-03 DIAGNOSIS — I10 Essential (primary) hypertension: Secondary | ICD-10-CM | POA: Diagnosis not present

## 2015-11-03 DIAGNOSIS — K219 Gastro-esophageal reflux disease without esophagitis: Secondary | ICD-10-CM | POA: Diagnosis not present

## 2015-11-03 DIAGNOSIS — J449 Chronic obstructive pulmonary disease, unspecified: Secondary | ICD-10-CM | POA: Diagnosis not present

## 2015-11-15 ENCOUNTER — Other Ambulatory Visit: Payer: Self-pay | Admitting: Family Medicine

## 2015-11-16 DIAGNOSIS — H5200 Hypermetropia, unspecified eye: Secondary | ICD-10-CM | POA: Diagnosis not present

## 2015-11-16 DIAGNOSIS — H5210 Myopia, unspecified eye: Secondary | ICD-10-CM | POA: Diagnosis not present

## 2015-11-19 ENCOUNTER — Other Ambulatory Visit: Payer: Self-pay | Admitting: Family Medicine

## 2015-11-23 ENCOUNTER — Encounter: Payer: Self-pay | Admitting: Pharmacist Clinician (PhC)/ Clinical Pharmacy Specialist

## 2015-11-24 ENCOUNTER — Other Ambulatory Visit: Payer: Self-pay | Admitting: Family Medicine

## 2015-11-25 ENCOUNTER — Ambulatory Visit (INDEPENDENT_AMBULATORY_CARE_PROVIDER_SITE_OTHER): Payer: Commercial Managed Care - HMO | Admitting: Pharmacist

## 2015-11-25 DIAGNOSIS — I480 Paroxysmal atrial fibrillation: Secondary | ICD-10-CM

## 2015-11-25 LAB — POCT INR: INR: 2.4

## 2015-11-25 NOTE — Patient Instructions (Signed)
Anticoagulation Dose Instructions as of 11/25/2015      Don [name] Tue Wed Thu Fri Sat   New Dose 4 mg 6 mg 4 mg 4 mg 4 mg 6 mg 4 mg    Description        Continue current warfarin dose of '4mg'$  take 1 tablet daily except on fridays and mondays take 1 and 1/2 tablets.     INR was 2.4 today

## 2015-12-02 NOTE — Telephone Encounter (Signed)
lmovm that written Rx is at front desk ready for pickup 

## 2015-12-20 ENCOUNTER — Ambulatory Visit: Payer: Commercial Managed Care - HMO | Admitting: Family Medicine

## 2015-12-23 ENCOUNTER — Other Ambulatory Visit: Payer: Self-pay | Admitting: Family Medicine

## 2015-12-31 ENCOUNTER — Ambulatory Visit (INDEPENDENT_AMBULATORY_CARE_PROVIDER_SITE_OTHER): Payer: Commercial Managed Care - HMO | Admitting: Pharmacist

## 2015-12-31 DIAGNOSIS — I480 Paroxysmal atrial fibrillation: Secondary | ICD-10-CM | POA: Diagnosis not present

## 2015-12-31 LAB — POCT INR: INR: 2.2

## 2015-12-31 NOTE — Patient Instructions (Signed)
Anticoagulation Dose Instructions as of 12/31/2015      [name] Tue Wed Thu Fri Sat   New Dose 4 mg 6 mg 4 mg 4 mg 4 mg 6 mg 4 mg    Description        Continue current warfarin dose of '4mg'$  take 1 tablet daily except on fridays and mondays take 1 and 1/2 tablets.     INR was 2.2 today

## 2016-01-05 ENCOUNTER — Other Ambulatory Visit (INDEPENDENT_AMBULATORY_CARE_PROVIDER_SITE_OTHER): Payer: Commercial Managed Care - HMO

## 2016-01-05 ENCOUNTER — Telehealth: Payer: Self-pay | Admitting: Family Medicine

## 2016-01-05 ENCOUNTER — Other Ambulatory Visit: Payer: Self-pay | Admitting: Family Medicine

## 2016-01-05 DIAGNOSIS — E559 Vitamin D deficiency, unspecified: Secondary | ICD-10-CM | POA: Diagnosis not present

## 2016-01-05 DIAGNOSIS — D509 Iron deficiency anemia, unspecified: Secondary | ICD-10-CM

## 2016-01-05 DIAGNOSIS — R809 Proteinuria, unspecified: Secondary | ICD-10-CM

## 2016-01-05 DIAGNOSIS — N183 Chronic kidney disease, stage 3 unspecified: Secondary | ICD-10-CM

## 2016-01-05 DIAGNOSIS — I10 Essential (primary) hypertension: Secondary | ICD-10-CM | POA: Diagnosis not present

## 2016-01-05 DIAGNOSIS — Z79899 Other long term (current) drug therapy: Secondary | ICD-10-CM | POA: Diagnosis not present

## 2016-01-05 NOTE — Progress Notes (Signed)
Lab work for Dr Lowanda Foster Renal, H&H, iron/tibc, ferritin, pth, vitamin d, pro/creat E55.9, Z79.899, I10, N18.3, R80.9, D50.9

## 2016-01-05 NOTE — Telephone Encounter (Signed)
Left detailed message stating requested referral has been ordered and to CB with any further questions or concerns.

## 2016-01-06 LAB — RENAL FUNCTION PANEL
Albumin: 3.6 g/dL (ref 3.5–4.8)
BUN / CREAT RATIO: 13 (ref 10–22)
BUN: 29 mg/dL — ABNORMAL HIGH (ref 8–27)
CALCIUM: 8.5 mg/dL — AB (ref 8.6–10.2)
CO2: 23 mmol/L (ref 18–29)
CREATININE: 2.18 mg/dL — AB (ref 0.76–1.27)
Chloride: 105 mmol/L (ref 96–106)
GFR calc Af Amer: 33 mL/min/{1.73_m2} — ABNORMAL LOW (ref >59)
GFR, EST NON AFRICAN AMERICAN: 28 mL/min/{1.73_m2} — AB (ref >59)
GLUCOSE: 56 mg/dL — AB (ref 65–99)
PHOSPHORUS: 2.8 mg/dL (ref 2.5–4.5)
POTASSIUM: 3.9 mmol/L (ref 3.5–5.2)
SODIUM: 144 mmol/L (ref 134–144)

## 2016-01-06 LAB — VITAMIN D 25 HYDROXY (VIT D DEFICIENCY, FRACTURES): VIT D 25 HYDROXY: 40.9 ng/mL (ref 30.0–100.0)

## 2016-01-06 LAB — PARATHYROID HORMONE, INTACT (NO CA): PTH: 74 pg/mL — AB (ref 15–65)

## 2016-01-06 LAB — IRON AND TIBC
IRON SATURATION: 23 % (ref 15–55)
IRON: 56 ug/dL (ref 38–169)
Total Iron Binding Capacity: 240 ug/dL — ABNORMAL LOW (ref 250–450)
UIBC: 184 ug/dL (ref 111–343)

## 2016-01-06 LAB — HEMATOCRIT: Hematocrit: 41.6 % (ref 37.5–51.0)

## 2016-01-06 LAB — PROTEIN / CREATININE RATIO, URINE
CREATININE, UR: 132.1 mg/dL
PROTEIN UR: 348.6 mg/dL
PROTEIN/CREAT RATIO: 2639 mg/g{creat} — AB (ref 0–200)

## 2016-01-06 LAB — HEMOGLOBIN: HEMOGLOBIN: 14.5 g/dL (ref 12.6–17.7)

## 2016-01-06 LAB — FERRITIN: Ferritin: 148 ng/mL (ref 30–400)

## 2016-01-12 DIAGNOSIS — N184 Chronic kidney disease, stage 4 (severe): Secondary | ICD-10-CM | POA: Diagnosis not present

## 2016-01-12 DIAGNOSIS — Z716 Tobacco abuse counseling: Secondary | ICD-10-CM | POA: Diagnosis not present

## 2016-01-12 DIAGNOSIS — R809 Proteinuria, unspecified: Secondary | ICD-10-CM | POA: Diagnosis not present

## 2016-01-26 ENCOUNTER — Other Ambulatory Visit: Payer: Self-pay | Admitting: Family Medicine

## 2016-01-27 NOTE — Telephone Encounter (Signed)
Last seen 09/08/16 Dr Dettinger  Dr Sabra Heck PCP  If approved route to nurse to call into [facility]

## 2016-01-27 NOTE — Telephone Encounter (Signed)
rx called into pharmacy

## 2016-02-02 ENCOUNTER — Encounter: Payer: Self-pay | Admitting: *Deleted

## 2016-02-10 ENCOUNTER — Encounter: Payer: Self-pay | Admitting: Family Medicine

## 2016-02-10 ENCOUNTER — Ambulatory Visit (INDEPENDENT_AMBULATORY_CARE_PROVIDER_SITE_OTHER): Payer: Commercial Managed Care - HMO | Admitting: Family Medicine

## 2016-02-10 VITALS — BP 154/76 | HR 48 | Temp 96.7°F | Ht 67.75 in | Wt 162.6 lb

## 2016-02-10 DIAGNOSIS — J449 Chronic obstructive pulmonary disease, unspecified: Secondary | ICD-10-CM

## 2016-02-10 DIAGNOSIS — L570 Actinic keratosis: Secondary | ICD-10-CM

## 2016-02-10 DIAGNOSIS — I4891 Unspecified atrial fibrillation: Secondary | ICD-10-CM | POA: Diagnosis not present

## 2016-02-10 MED ORDER — BUDESONIDE-FORMOTEROL FUMARATE 160-4.5 MCG/ACT IN AERO: 2.0000 | INHALATION_SPRAY | Freq: Two times a day (BID) | RESPIRATORY_TRACT | Status: DC

## 2016-02-10 MED ORDER — NITROGLYCERIN 0.4 MG SL SUBL: 0.4000 mg | SUBLINGUAL_TABLET | SUBLINGUAL | Status: DC | PRN

## 2016-02-10 NOTE — Progress Notes (Signed)
Subjective:    Patient ID: Don [name], male    DOB: [birth year], 75 y.o.   MRN: [id number]  HPI 75 year old gentleman who is here to follow-up chronic problems including atrial fibrillation, COPD, hypertension,. He does have a history of prostate cancer but that is followed by urology. Seems like we spent lots of time today talking about his wife who is in the nursing home because he could not really meet her needs and was very challenged and try. His wife's children feel like he should keep her at home and not put her in a nursing home but I think from what is told me that was the right decision. There are still a lot of guilt around. Overall he feels pretty well. He continues to smoke with COPD. Only other specific complaints are some rough areas on his dorsum of his hands that appear to be keratoses. He expressed interest in having these frozen today.  Patient Active Problem List   Diagnosis Date Noted  . Pre-diabetes 07/23/2015  . Metabolic syndrome 08/65/7846  . Urinary bladder incontinence 03/04/2015  . Paroxysmal atrial fibrillation ([place]) 05/25/2014  . Hiatal hernia 04/06/2014  . Other and unspecified hyperlipidemia 04/04/2014  . Personal history of colonic polyps 04/04/2014  . History of prostate cancer 04/03/2014  . Stage III chronic kidney disease 04/03/2014  . Tenosynovitis of thumb 10/16/2013  . Atrial fibrillation ([place]) 05/19/2013  . Tobacco abuse 04/25/2013  . Hernia 02/08/2011  . Prostate cancer ([place]) 02/08/2011  . COPD (chronic obstructive pulmonary disease) ([place]) 02/08/2011  . Hypertension 02/08/2011   Outpatient Encounter Prescriptions as of 02/10/2016  Medication Sig  . albuterol (PROVENTIL HFA;VENTOLIN HFA) 108 (90 BASE) MCG/ACT inhaler Inhale 2 puffs into the lungs every 6 (six) hours as needed for wheezing or shortness of breath.  [name] atorvastatin (LIPITOR) 20 MG tablet TAKE 1 TABLET DAILY  . budesonide-formoterol (SYMBICORT) 160-4.5 MCG/ACT inhaler Inhale 2  puffs into the lungs 2 (two) times daily.  . clonazePAM (KLONOPIN) 1 MG tablet TAKE 1/2 TABLET ONCE DAILY AS NEEDED FOR ANXIETY  . digoxin (LANOXIN) 0.125 MG tablet TAKE 1 TABLET DAILY  . diltiazem (CARDIZEM CD) 300 MG 24 hr capsule TAKE (1) CAPSULE DAILY  . DiphenhydrAMINE HCl, Sleep, (SLEEP-AID MAXIMUM STRENGTH) 50 MG CAPS Take 1 capsule by mouth at bedtime.  . DULoxetine (CYMBALTA) 60 MG capsule TAKE (1) CAPSULE DAILY  . FERREX 150 150 MG capsule Take 1 capsule by mouth daily.  . Incontinence Supply Disposable (INCONTINENCE BRIEF LARGE) MISC Use as needed for urinary incontinence.  Dx: urinary incontinence R32 and prostate cancer C61  . lisinopril (PRINIVIL,ZESTRIL) 5 MG tablet TAKE 1 TABLET DAILY  . loratadine (CLARITIN) 10 MG tablet Take 1 tablet (10 mg total) by mouth every evening. For allergies  . metoprolol tartrate (LOPRESSOR) 25 MG tablet TAKE (1) TABLET TWICE A DAY.  . Multiple Vitamin (MULTIVITAMIN WITH MINERALS) TABS Take 1 tablet by mouth daily.  . nitroGLYCERIN (NITROSTAT) 0.4 MG SL tablet Place 0.4 mg under the tongue every 5 (five) minutes as needed for chest pain.  [name] omega-3 acid ethyl esters (LOVAZA) 1 G capsule TAKE (2) CAPSULES TWICE DAILY.  . pantoprazole (PROTONIX) 40 MG tablet TAKE 1 TABLET DAILY  . Polyethyl Glycol-Propyl Glycol (SYSTANE OP) Place 1 drop into both eyes 2 (two) times daily as needed (dry eyes).   . Probiotic Product (PRO-BIOTIC BLEND) CAPS Take 1 capsule by mouth daily.  [name] terazosin (HYTRIN) 2 MG capsule TAKE 1 CAPSULE AT BEDTIME  .  tiotropium (SPIRIVA) 18 MCG inhalation capsule Place 1 capsule (18 mcg total) into inhaler and inhale daily.  [name] tobramycin (TOBREX) 0.3 % ophthalmic solution Place 2 drops into the right eye every 6 (six) hours.  . triamcinolone cream (KENALOG) 0.1 % Apply 1 application topically 2 (two) times daily.  [name] warfarin (COUMADIN) 4 MG tablet TAKE 1 TO 1&1/2 TABLETS DAILY AS DIRECTED BY CLINIC  . [DISCONTINUED] ketotifen (ZADITOR)  0.025 % ophthalmic solution Place 2 drops into the left eye 2 (two) times daily.   No facility-administered encounter medications on file as of 02/10/2016.      Review of Systems  Constitutional: Negative.   Respiratory: Positive for shortness of breath.   Cardiovascular: Negative.   Neurological: Negative.   Psychiatric/Behavioral: Negative.        Objective:   Physical Exam  Constitutional: He is oriented to person, place, and time. He appears well-developed and well-nourished.  Cardiovascular: Normal rate.   Pulmonary/Chest: Effort normal and breath sounds normal.  Neurological: He is alert and oriented to person, place, and time.  Skin:  For areas on left hand were frozen with liquid nitrogen and 1 area on right hand.          Assessment & Plan:  1. Atrial fibrillation, unspecified No problems with chest pain or palpitation. Rhythm is fairly regular today on auscultation  2. Chronic obstructive pulmonary disease, unspecified COPD, unspecified chronic bronchitis type He is to smoke and cough. No recent increase in sputum or change in color. Did give him sample of Symbicort in office today   Wardell Honour MD

## 2016-02-15 ENCOUNTER — Other Ambulatory Visit: Payer: Self-pay | Admitting: Family Medicine

## 2016-02-24 ENCOUNTER — Other Ambulatory Visit: Payer: Self-pay | Admitting: Family Medicine

## 2016-02-24 NOTE — Telephone Encounter (Signed)
Last seen 02/10/16  Dr Sabra Heck  If approved route to nurse to call into [facility]

## 2016-02-28 NOTE — Telephone Encounter (Signed)
rx called into pharmacy

## 2016-03-01 ENCOUNTER — Other Ambulatory Visit: Payer: Self-pay | Admitting: Family Medicine

## 2016-03-01 DIAGNOSIS — L859 Epidermal thickening, unspecified: Secondary | ICD-10-CM

## 2016-03-01 MED ORDER — TRIAMCINOLONE ACETONIDE 0.1 % EX CREA: 1.0000 "application " | TOPICAL_CREAM | Freq: Two times a day (BID) | CUTANEOUS | Status: DC

## 2016-03-01 NOTE — Telephone Encounter (Signed)
done

## 2016-03-22 ENCOUNTER — Other Ambulatory Visit: Payer: Self-pay | Admitting: Family Medicine

## 2016-03-22 ENCOUNTER — Ambulatory Visit (INDEPENDENT_AMBULATORY_CARE_PROVIDER_SITE_OTHER): Payer: Commercial Managed Care - HMO | Admitting: Pharmacist

## 2016-03-22 VITALS — BP 124/60 | HR 62 | Ht 68.0 in | Wt 161.0 lb

## 2016-03-22 DIAGNOSIS — D509 Iron deficiency anemia, unspecified: Secondary | ICD-10-CM | POA: Diagnosis not present

## 2016-03-22 DIAGNOSIS — I480 Paroxysmal atrial fibrillation: Secondary | ICD-10-CM | POA: Diagnosis not present

## 2016-03-22 DIAGNOSIS — Z79899 Other long term (current) drug therapy: Secondary | ICD-10-CM | POA: Diagnosis not present

## 2016-03-22 DIAGNOSIS — I10 Essential (primary) hypertension: Secondary | ICD-10-CM | POA: Diagnosis not present

## 2016-03-22 DIAGNOSIS — N183 Chronic kidney disease, stage 3 (moderate): Secondary | ICD-10-CM | POA: Diagnosis not present

## 2016-03-22 DIAGNOSIS — E559 Vitamin D deficiency, unspecified: Secondary | ICD-10-CM | POA: Diagnosis not present

## 2016-03-22 DIAGNOSIS — R809 Proteinuria, unspecified: Secondary | ICD-10-CM | POA: Diagnosis not present

## 2016-03-22 LAB — COAGUCHEK XS/INR WAIVED
INR: 2.3 — ABNORMAL HIGH (ref 0.9–1.1)
PROTHROMBIN TIME: 28 s

## 2016-03-22 NOTE — Patient Instructions (Addendum)
Get polysporin or neosporin (triple antibiotic) cream and apply twice daily to cut on right index finger.  Keep cut covered.   Anticoagulation Dose Instructions as of 03/22/2016      [name] Tue Wed Thu Fri Sat   New Dose 4 mg 6 mg 4 mg 4 mg 4 mg 6 mg 4 mg    Description        Continue current warfarin dose of '4mg'$  take 1 tablet daily except on fridays and mondays take 1 and 1/2 tablets.     INR was 2.3 today

## 2016-03-23 ENCOUNTER — Encounter: Payer: Self-pay | Admitting: Pharmacist

## 2016-03-23 ENCOUNTER — Other Ambulatory Visit: Payer: Commercial Managed Care - HMO

## 2016-03-23 DIAGNOSIS — N183 Chronic kidney disease, stage 3 (moderate): Secondary | ICD-10-CM | POA: Diagnosis not present

## 2016-03-23 DIAGNOSIS — Z79899 Other long term (current) drug therapy: Secondary | ICD-10-CM | POA: Diagnosis not present

## 2016-03-23 DIAGNOSIS — D509 Iron deficiency anemia, unspecified: Secondary | ICD-10-CM | POA: Diagnosis not present

## 2016-03-23 DIAGNOSIS — R809 Proteinuria, unspecified: Secondary | ICD-10-CM | POA: Diagnosis not present

## 2016-03-23 DIAGNOSIS — I10 Essential (primary) hypertension: Secondary | ICD-10-CM | POA: Diagnosis not present

## 2016-03-23 DIAGNOSIS — E559 Vitamin D deficiency, unspecified: Secondary | ICD-10-CM | POA: Diagnosis not present

## 2016-03-29 DIAGNOSIS — I10 Essential (primary) hypertension: Secondary | ICD-10-CM | POA: Diagnosis not present

## 2016-03-29 DIAGNOSIS — N184 Chronic kidney disease, stage 4 (severe): Secondary | ICD-10-CM | POA: Diagnosis not present

## 2016-03-29 DIAGNOSIS — R809 Proteinuria, unspecified: Secondary | ICD-10-CM | POA: Diagnosis not present

## 2016-03-30 DIAGNOSIS — H353133 Nonexudative age-related macular degeneration, bilateral, advanced atrophic without subfoveal involvement: Secondary | ICD-10-CM | POA: Diagnosis not present

## 2016-03-30 DIAGNOSIS — H35372 Puckering of macula, left eye: Secondary | ICD-10-CM | POA: Diagnosis not present

## 2016-03-30 DIAGNOSIS — H2513 Age-related nuclear cataract, bilateral: Secondary | ICD-10-CM | POA: Diagnosis not present

## 2016-04-21 ENCOUNTER — Other Ambulatory Visit: Payer: Self-pay | Admitting: Family Medicine

## 2016-04-21 DIAGNOSIS — H353212 Exudative age-related macular degeneration, right eye, with inactive choroidal neovascularization: Secondary | ICD-10-CM | POA: Diagnosis not present

## 2016-04-21 DIAGNOSIS — H43812 Vitreous degeneration, left eye: Secondary | ICD-10-CM | POA: Diagnosis not present

## 2016-04-21 DIAGNOSIS — H353124 Nonexudative age-related macular degeneration, left eye, advanced atrophic with subfoveal involvement: Secondary | ICD-10-CM | POA: Diagnosis not present

## 2016-04-21 DIAGNOSIS — H43822 Vitreomacular adhesion, left eye: Secondary | ICD-10-CM | POA: Diagnosis not present

## 2016-05-03 ENCOUNTER — Encounter: Payer: Self-pay | Admitting: Pharmacist

## 2016-05-03 ENCOUNTER — Ambulatory Visit (INDEPENDENT_AMBULATORY_CARE_PROVIDER_SITE_OTHER): Payer: Commercial Managed Care - HMO | Admitting: Pharmacist

## 2016-05-03 VITALS — BP 120/68 | HR 52 | Ht 68.0 in | Wt 158.0 lb

## 2016-05-03 DIAGNOSIS — I480 Paroxysmal atrial fibrillation: Secondary | ICD-10-CM | POA: Diagnosis not present

## 2016-05-03 LAB — COAGUCHEK XS/INR WAIVED
INR: 2.7 — AB (ref 0.9–1.1)
PROTHROMBIN TIME: 32.1 s

## 2016-05-03 NOTE — Patient Instructions (Signed)
Anticoagulation Dose Instructions as of 05/03/2016      Don [name] Tue Wed Thu Fri Sat   New Dose 4 mg 6 mg 4 mg 4 mg 4 mg 6 mg 4 mg    Description        Continue current warfarin dose of '4mg'$  take 1 tablet daily except on fridays and mondays take 1 and 1/2 tablets.     INR was 2.7 today

## 2016-05-05 ENCOUNTER — Other Ambulatory Visit: Payer: Self-pay | Admitting: Family Medicine

## 2016-05-25 ENCOUNTER — Ambulatory Visit (HOSPITAL_COMMUNITY)
Admission: RE | Admit: 2016-05-25 | Discharge: 2016-05-25 | Disposition: A | Discharge status: Home / Self Care | Payer: Commercial Managed Care - HMO | Source: Ambulatory Visit | Attending: Family Medicine | Admitting: Family Medicine

## 2016-05-25 ENCOUNTER — Encounter: Payer: Self-pay | Admitting: Family Medicine

## 2016-05-25 ENCOUNTER — Ambulatory Visit (INDEPENDENT_AMBULATORY_CARE_PROVIDER_SITE_OTHER): Payer: Commercial Managed Care - HMO | Admitting: Family Medicine

## 2016-05-25 VITALS — BP 130/67 | HR 60 | Temp 97.3°F | Ht 67.75 in | Wt 155.4 lb

## 2016-05-25 DIAGNOSIS — M79662 Pain in left lower leg: Secondary | ICD-10-CM

## 2016-05-25 NOTE — Progress Notes (Signed)
BP 130/67 mmHg  Pulse 60  Temp(Src) 97.3 F (36.3 C) (Oral)  Ht 5' 7.75" (1.721 m)  Wt 155 lb 6.4 oz (70.489 kg)  BMI 23.80 kg/m2  SpO2 97%   Subjective:    Patient ID: Don [name], male    DOB: [birth year], 75 y.o.   MRN: [id number]  HPI: Don [name] is a 75 y.o. male presenting on 05/25/2016 for L lower calf pain   HPI Left calf pain Patient has been having left calf pain is been going on for the past 2 days. The pain is in the back lower third of his left calf and is worse with dorsiflexion. It is also tender when he walks on it. He denies any pain radiation anywhere else, he denies any numbness or weakness. He does have some swelling  Relevant past medical, surgical, family and social history reviewed and updated as indicated. Interim medical history since our last visit reviewed. Allergies and medications reviewed and updated.  Review of Systems  Constitutional: Negative for fever.  HENT: Negative for ear discharge and ear pain.   Eyes: Negative for discharge and visual disturbance.  Respiratory: Negative for shortness of breath and wheezing.   Cardiovascular: Positive for leg swelling. Negative for chest pain.  Gastrointestinal: Negative for abdominal pain, diarrhea and constipation.  Genitourinary: Negative for difficulty urinating.  Musculoskeletal: Positive for myalgias. Negative for back pain and gait problem.  Skin: Negative for rash.  Neurological: Negative for syncope, light-headedness and headaches.  All other systems reviewed and are negative.   Per HPI unless specifically indicated above     Medication List       This list is accurate as of: 05/25/16  3:18 PM.  Always use your most recent med list.               albuterol 108 (90 Base) MCG/ACT inhaler  Commonly known as:  PROVENTIL HFA;VENTOLIN HFA  Inhale 2 puffs into the lungs every 6 (six) hours as needed for wheezing or shortness of breath.     atorvastatin 20 MG tablet  Commonly known  as:  LIPITOR  TAKE 1 TABLET DAILY     budesonide-formoterol 160-4.5 MCG/ACT inhaler  Commonly known as:  SYMBICORT  Inhale 2 puffs into the lungs 2 (two) times daily.     clonazePAM 1 MG tablet  Commonly known as:  KLONOPIN  TAKE 1/2 TABLET ONCE DAILY AS NEEDED FOR ANXIETY     digoxin 0.125 MG tablet  Commonly known as:  LANOXIN  TAKE 1 TABLET DAILY     diltiazem 300 MG 24 hr capsule  Commonly known as:  CARDIZEM CD  TAKE (1) CAPSULE DAILY     doxylamine (Sleep) 25 MG tablet  Commonly known as:  UNISOM  Take 25 mg by mouth at bedtime as needed.     DULoxetine 60 MG capsule  Commonly known as:  CYMBALTA  TAKE (1) CAPSULE DAILY     FERREX 150 150 MG capsule  Generic drug:  iron polysaccharides  Take 1 capsule by mouth daily.     Incontinence Brief Large Misc  Use as needed for urinary incontinence.  Dx: urinary incontinence R32 and prostate cancer C61     lisinopril 10 MG tablet  Commonly known as:  PRINIVIL,ZESTRIL  Take 10 mg by mouth daily.     loratadine 10 MG tablet  Commonly known as:  CLARITIN  Take 1 tablet (10 mg total) by mouth every evening. For allergies  metoprolol tartrate 25 MG tablet  Commonly known as:  LOPRESSOR  TAKE (1) TABLET TWICE A DAY.     multivitamin with minerals Tabs tablet  Take 1 tablet by mouth daily.     nitroGLYCERIN 0.4 MG SL tablet  Commonly known as:  NITROSTAT  Place 1 tablet (0.4 mg total) under the tongue every 5 (five) minutes as needed for chest pain.     omega-3 acid ethyl esters 1 g capsule  Commonly known as:  LOVAZA  TAKE (2) CAPSULES TWICE DAILY.     pantoprazole 40 MG tablet  Commonly known as:  PROTONIX  TAKE 1 TABLET DAILY     PRO-BIOTIC BLEND Caps  Take 1 capsule by mouth daily.     SYSTANE OP  Place 1 drop into both eyes 2 (two) times daily as needed (dry eyes).     terazosin 2 MG capsule  Commonly known as:  HYTRIN  TAKE 1 CAPSULE AT BEDTIME     tiotropium 18 MCG inhalation capsule  Commonly  known as:  SPIRIVA  Place 1 capsule (18 mcg total) into inhaler and inhale daily.     triamcinolone cream 0.1 %  Commonly known as:  KENALOG  Apply 1 application topically 2 (two) times daily.     warfarin 4 MG tablet  Commonly known as:  COUMADIN  TAKE 1 TO 1&1/2 TABLETS DAILY AS DIRECTED BY CLINIC           Objective:    BP 130/67 mmHg  Pulse 60  Temp(Src) 97.3 F (36.3 C) (Oral)  Ht 5' 7.75" (1.721 m)  Wt 155 lb 6.4 oz (70.489 kg)  BMI 23.80 kg/m2  SpO2 97%  Wt Readings from Last 3 Encounters:  05/25/16 155 lb 6.4 oz (70.489 kg)  05/03/16 158 lb (71.668 kg)  03/22/16 161 lb (73.029 kg)    Physical Exam  Constitutional: He is oriented to person, place, and time. He appears well-developed and well-nourished. No distress.  Eyes: Conjunctivae and EOM are normal. Pupils are equal, round, and reactive to light. Right eye exhibits no discharge. No scleral icterus.  Neck: Neck supple. No thyromegaly present.  Cardiovascular: Normal rate, regular rhythm, normal heart sounds and intact distal pulses.   No murmur heard. Pulmonary/Chest: Effort normal and breath sounds normal. No respiratory distress. He has no wheezes.  Musculoskeletal: Normal range of motion. He exhibits tenderness (Calf tenderness with mild swelling and non-pitting edema). He exhibits no edema.  Lymphadenopathy:    He has no cervical adenopathy.  Neurological: He is alert and oriented to person, place, and time. Coordination normal.  Skin: Skin is warm and dry. No rash noted. He is not diaphoretic.  Psychiatric: He has a normal mood and affect. His behavior is normal.  Nursing note and vitals reviewed.     Assessment & Plan:   Problem List Items Addressed This Visit    None    Visit Diagnoses    Calf pain, left    -  Primary    Relevant Orders    US Venous Img Lower Unilateral Left       Follow up plan: Return if symptoms worsen or fail to improve.  Counseling provided for all of the vaccine  components Orders Placed This Encounter  Procedures  . US Venous Img Lower Unilateral Left    Caryl Pina, MD [facility] 05/25/2016, 3:18 PM

## 2016-06-06 ENCOUNTER — Ambulatory Visit (INDEPENDENT_AMBULATORY_CARE_PROVIDER_SITE_OTHER): Payer: Commercial Managed Care - HMO | Admitting: Pharmacist

## 2016-06-06 DIAGNOSIS — I480 Paroxysmal atrial fibrillation: Secondary | ICD-10-CM | POA: Diagnosis not present

## 2016-06-06 LAB — COAGUCHEK XS/INR WAIVED
INR: 2.3 — ABNORMAL HIGH (ref 0.9–1.1)
PROTHROMBIN TIME: 27.5 s

## 2016-06-22 ENCOUNTER — Other Ambulatory Visit: Payer: Self-pay | Admitting: Family Medicine

## 2016-06-23 ENCOUNTER — Other Ambulatory Visit: Payer: Self-pay | Admitting: Family Medicine

## 2016-07-05 ENCOUNTER — Telehealth: Payer: Self-pay | Admitting: Family Medicine

## 2016-07-05 NOTE — Telephone Encounter (Signed)
Patient aware that he needed to contact Dr.Befekadu office. Patient verbalizes understanding.

## 2016-07-06 ENCOUNTER — Other Ambulatory Visit: Payer: Self-pay | Admitting: Pharmacist

## 2016-07-12 ENCOUNTER — Other Ambulatory Visit: Payer: Commercial Managed Care - HMO

## 2016-07-12 DIAGNOSIS — I10 Essential (primary) hypertension: Secondary | ICD-10-CM | POA: Diagnosis not present

## 2016-07-12 DIAGNOSIS — E559 Vitamin D deficiency, unspecified: Secondary | ICD-10-CM | POA: Diagnosis not present

## 2016-07-12 DIAGNOSIS — R809 Proteinuria, unspecified: Secondary | ICD-10-CM | POA: Diagnosis not present

## 2016-07-12 DIAGNOSIS — Z79899 Other long term (current) drug therapy: Secondary | ICD-10-CM | POA: Diagnosis not present

## 2016-07-12 DIAGNOSIS — R7301 Impaired fasting glucose: Secondary | ICD-10-CM | POA: Diagnosis not present

## 2016-07-12 DIAGNOSIS — N183 Chronic kidney disease, stage 3 (moderate): Secondary | ICD-10-CM | POA: Diagnosis not present

## 2016-07-12 DIAGNOSIS — D649 Anemia, unspecified: Secondary | ICD-10-CM | POA: Diagnosis not present

## 2016-07-14 ENCOUNTER — Other Ambulatory Visit: Payer: Commercial Managed Care - HMO

## 2016-07-21 ENCOUNTER — Other Ambulatory Visit: Payer: Self-pay | Admitting: Family Medicine

## 2016-07-24 ENCOUNTER — Encounter: Payer: Self-pay | Admitting: Pharmacist

## 2016-07-24 ENCOUNTER — Ambulatory Visit (INDEPENDENT_AMBULATORY_CARE_PROVIDER_SITE_OTHER): Payer: Commercial Managed Care - HMO | Admitting: Pharmacist

## 2016-07-24 VITALS — BP 122/64 | HR 62 | Ht 67.75 in | Wt 155.0 lb

## 2016-07-24 DIAGNOSIS — R7303 Prediabetes: Secondary | ICD-10-CM

## 2016-07-24 DIAGNOSIS — N183 Chronic kidney disease, stage 3 unspecified: Secondary | ICD-10-CM

## 2016-07-24 DIAGNOSIS — Z0000 Encounter for general adult medical examination without abnormal findings: Secondary | ICD-10-CM | POA: Diagnosis not present

## 2016-07-24 DIAGNOSIS — I480 Paroxysmal atrial fibrillation: Secondary | ICD-10-CM

## 2016-07-24 DIAGNOSIS — L859 Epidermal thickening, unspecified: Secondary | ICD-10-CM

## 2016-07-24 DIAGNOSIS — Z79899 Other long term (current) drug therapy: Secondary | ICD-10-CM | POA: Diagnosis not present

## 2016-07-24 DIAGNOSIS — F17210 Nicotine dependence, cigarettes, uncomplicated: Secondary | ICD-10-CM

## 2016-07-24 DIAGNOSIS — E785 Hyperlipidemia, unspecified: Secondary | ICD-10-CM | POA: Diagnosis not present

## 2016-07-24 LAB — BAYER DCA HB A1C WAIVED: HB A1C (BAYER DCA - WAIVED): 5.6 % (ref <7.0)

## 2016-07-24 LAB — COAGUCHEK XS/INR WAIVED
INR: 2.2 — AB (ref 0.9–1.1)
Prothrombin Time: 25.9 s

## 2016-07-24 MED ORDER — TRIAMCINOLONE ACETONIDE 0.1 % EX CREA: 1.0000 "application " | TOPICAL_CREAM | Freq: Two times a day (BID) | CUTANEOUS | 0 refills | Status: DC

## 2016-07-24 NOTE — Progress Notes (Signed)
Patient ID: Don [name], male   DOB: 04-21-1941, 75 y.o.   MRN: [id number]    Subjective:   Don [name] is a 75 y.o. white, male who presents for an subsequent Medicare Annual Wellness Visit.  Mr. Kneeland is married but his wife currently resides in a nursing home. He lives at home by himself.  He was in [employer] for 4 years.  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  Current Medications (verified) Outpatient Encounter Prescriptions as of 07/24/2016  Medication Sig  . albuterol (PROVENTIL HFA;VENTOLIN HFA) 108 (90 BASE) MCG/ACT inhaler Inhale 2 puffs into the lungs every 6 (six) hours as needed for wheezing or shortness of breath.  . ALLERGY RELIEF 10 MG tablet TAKE 1 TABLET DAILY FOR ALLERGY  . atorvastatin (LIPITOR) 20 MG tablet TAKE 1 TABLET DAILY  . clonazePAM (KLONOPIN) 1 MG tablet TAKE 1/2 TABLET ONCE DAILY AS NEEDED FOR ANXIETY  . digoxin (LANOXIN) 0.125 MG tablet TAKE 1 TABLET DAILY  . diltiazem (CARDIZEM CD) 300 MG 24 hr capsule TAKE (1) CAPSULE DAILY  . doxylamine, Sleep, (UNISOM) 25 MG tablet Take 25 mg by mouth at bedtime as needed.  . DULoxetine (CYMBALTA) 60 MG capsule TAKE (1) CAPSULE DAILY  . FERREX 150 150 MG capsule Take 1 capsule by mouth daily.  . furosemide (LASIX) 40 MG tablet Take 1 tablet by mouth every morning.  . Incontinence Supply Disposable (INCONTINENCE BRIEF LARGE) MISC Use as needed for urinary incontinence.  Dx: urinary incontinence R32 and prostate cancer C61  . lisinopril (PRINIVIL,ZESTRIL) 10 MG tablet Take 10 mg by mouth daily.  . metoprolol tartrate (LOPRESSOR) 25 MG tablet TAKE (1) TABLET TWICE A DAY.  . Multiple Vitamin (MULTIVITAMIN WITH MINERALS) TABS Take 1 tablet by mouth daily.  [name] omega-3 acid ethyl esters (LOVAZA) 1 G capsule TAKE (2) CAPSULES TWICE DAILY.  . pantoprazole (PROTONIX) 40 MG tablet TAKE 1 TABLET DAILY  . Polyethyl Glycol-Propyl  Glycol (SYSTANE OP) Place 1 drop into both eyes 2 (two) times daily as needed (dry eyes).   . terazosin (HYTRIN) 2 MG capsule TAKE 1 CAPSULE AT BEDTIME  . tiotropium (SPIRIVA) 18 MCG inhalation capsule Place 1 capsule (18 mcg total) into inhaler and inhale daily.  [name] warfarin (COUMADIN) 4 MG tablet TAKE 1 TO 1&1/2 TABLETS DAILY AS DIRECTED BY CLINIC  . budesonide-formoterol (SYMBICORT) 160-4.5 MCG/ACT inhaler Inhale 2 puffs into the lungs 2 (two) times daily.  . nitroGLYCERIN (NITROSTAT) 0.4 MG SL tablet Place 1 tablet (0.4 mg total) under the tongue every 5 (five) minutes as needed for chest pain. (Patient not taking: Reported on 07/24/2016)  . Probiotic Product (PRO-BIOTIC BLEND) CAPS Take 1 capsule by mouth daily. (Patient not taking: Reported on 07/24/2016)  . triamcinolone cream (KENALOG) 0.1 % Apply 1 application topically 2 (two) times daily.  . [DISCONTINUED] triamcinolone cream (KENALOG) 0.1 % Apply 1 application topically 2 (two) times daily.   No facility-administered encounter medications on file as of 07/24/2016.     Allergies (verified) Wellbutrin [bupropion]   History: Past Medical History:  Diagnosis Date  . Acute bronchitis 04/03/2014  . Anxiety   . Atrial fibrillation with RVR ([place]) 04/03/2014  . Cataract   . COPD (chronic obstructive pulmonary disease) ([place])   . Hyperlipidemia   . Hypertension   . Macular degeneration   . Noncompliance with medications 04/2013   xarelto, digoxin  .  Pre-diabetes   . Prostate cancer ([place])   . Stage III chronic kidney disease 04/03/2014  . Tobacco abuse    ongoing  . Tubular adenoma of colon 07/31/02, 11/18/03   Past Surgical History:  Procedure Laterality Date  . Anal abcess,Hemorroids,    . COLONOSCOPY N/A 04/05/2014   Procedure: COLONOSCOPY;  Surgeon: Ladene Artist, MD;  Location: [facility];  Service: Endoscopy;  Laterality: N/A;  . COLONOSCOPY W/ BIOPSIES  11/18/2003   Dr. Earle Gell  . ESOPHAGOGASTRODUODENOSCOPY   11/18/2003   Dr. Earle Gell  . ESOPHAGOGASTRODUODENOSCOPY N/A 04/05/2014   Procedure: ESOPHAGOGASTRODUODENOSCOPY (EGD);  Surgeon: Ladene Artist, MD;  Location: [facility];  Service: Endoscopy;  Laterality: N/A;  . RETROPUBIC PROSTATECTOMY  11/26/2001  . TONSILLECTOMY     Family History  Problem Relation Age of Onset  . Diabetes Father   . Heart disease Father     MI  . Heart attack Father   . Heart attack Mother   . Heart disease Brother   . Cancer Brother     lung  . Lung cancer Brother   . Heart disease Brother   . Lung cancer Sister   . Cancer Sister     lung  . Heart disease Brother   . Heart disease Brother    Social History   Occupational History  . retired    Social History Main Topics  . Smoking status: Current Every Day Smoker    Packs/day: 1.00    Years: 62.00    Types: Cigarettes, Cigars  . Smokeless tobacco: Never Used  . Alcohol use No  . Drug use: No  . Sexual activity: No    Do you feel safe at home?  Yes Are there smokers in your home (other than you)? No  Dietary issues and exercise activities discussed: Current Exercise Habits: The patient does not participate in regular exercise at present, Exercise limited by: respiratory conditions(s)  Current Dietary habits:  Breakfast - cereal / cornflakes sometimes with fruit.  Lunch - canned soup or sandwhich Supper - taco bell - taco salad (lettuce, beans, chicken and tomatoes or sandwhich Eats out about 25% of times.   Cardiac Risk Factors include: advanced age (>68mn, >>108women);dyslipidemia;family history of premature cardiovascular disease;male gender;smoking/ tobacco exposure;sedentary lifestyle  Objective:    Today's Vitals   07/24/16 1416  BP: 122/64  Pulse: 62  Weight: 155 lb (70.3 kg)  Height: 5' 7.75" (1.721 m)  PainSc: 0-No pain   Body mass index is 23.74 kg/m.   Activities of Daily Living In your present state of health, do you have any difficulty performing the  following activities: 07/24/2016 05/25/2016  Hearing? N N  Vision? Y N  Difficulty concentrating or making decisions? N N  Walking or climbing stairs? N N  Dressing or bathing? N N  Doing errands, shopping? N N  Preparing Food and eating ? N -  Using the Toilet? N -  In the past six months, have you accidently leaked urine? N -  Do you have problems with loss of bowel control? N -  Managing your Medications? Y -  Managing your Finances? N -  Housekeeping or managing your Housekeeping? N -  Some recent data might be hidden     Depression Screen PHQ 2/9 Scores 07/24/2016 05/25/2016 02/10/2016 09/09/2015  PHQ - 2 Score '2 1 2 '$ 0  PHQ- 9 Score 3 - 6 -     Fall Risk Fall Risk  07/24/2016 05/25/2016 02/10/2016  09/09/2015 08/11/2015  Falls in the past year? No No No No Yes  Number falls in past yr: - - - - 1  Injury with Fall? - - - - No    Cognitive Function: MMSE - Mini Mental State Exam 07/23/2015  Orientation to time 5  Orientation to Place 5  Registration 3  Attention/ Calculation 3  Recall 3  Language- name 2 objects 2  Language- repeat 1  Language- follow 3 step command 3  Language- read & follow direction 1  Write a sentence 1  Copy design 1  Total score 28    Immunizations and Health Maintenance Immunization History  Administered Date(s) Administered  . Pneumococcal Conjugate-13 07/23/2015   Health Maintenance Due  Topic Date Due  . TETANUS/TDAP  08/01/1958  . ZOSTAVAX  08/02/1999  . INFLUENZA VACCINE  06/13/2016  . PNA vac Low Risk Adult (2 of 2 - PPSV23) 07/22/2016    Patient Care Team: Wardell Honour, MD as PCP - General (Family Medicine) Fran Lowes, MD as Consulting Physician (Nephrology) Sherlynn Stalls, MD as Consulting Physician (Ophthalmology) Truc Manus Gunning, OD as Consulting Physician (Optometry) Gatha Mayer, MD as Consulting Physician (Gastroenterology)  Indicate any recent Medical Services you may have received from other than [facility] providers  in the past year (date may be approximate).    Assessment:    Annual Wellness Visit  Therapeutic anticoagulation / atrial fibrillation Medication Management - due to have digoxin level checked. Stage 3 kidney disease Pre diabetes   Screening Tests Health Maintenance  Topic Date Due  . TETANUS/TDAP  08/01/1958  . ZOSTAVAX  08/02/1999  . INFLUENZA VACCINE  06/13/2016  . PNA vac Low Risk Adult (2 of 2 - PPSV23) 07/22/2016  . COLONOSCOPY  04/06/2019        Plan:   During the course of the visit Antoni was educated and counseled about the following appropriate screening and preventive services:   Vaccines to include Pneumoccal, Influenza,  Td, Zostavax - patient is due Pneumo 23.  He is coming to see Dr Sabra Heck in about 3 weeks and will also be due to receive influenza vaccine.  Will give both vaccines at that appt  Colorectal cancer screening - FOBT and colonscopy are UTD  Cardiovascular disease screening - EKG last 12/2014; ECHO last 03/2014  Pre Diabetes - checking A1c today.  HBG reading per patient are at goal.  No changes recommended  Glaucoma screening / Eye Exam / Macular Degeneration - UTD  Nutrition counseling - Discussed limiting salt intake and CHO intake.   Smoking cessation counseling - patient declined pharmacotherapy to assist in smoking cessation.  He states he is not currently ready to quit.  Advanced Directives - UTD; will bring in copy  Physical Activity - increase as able - goal is 10 minutes daily to start and increase as able.   CT of lung ordered due to smoking history greater than 30 pack years  Orders Placed This Encounter  Procedures  . Bayer DCA Hb A1c Waived  . CBC with Differential/Platelet  . Lipid panel  . Digoxin level    Patient Instructions (the written plan) were given to the patient.   Cherre Robins, PharmD   07/24/2016

## 2016-07-24 NOTE — Patient Instructions (Addendum)
Don [name] , Thank you for taking time to come for your Medicare Wellness Visit. I appreciate your ongoing commitment to your health goals. Please review the following plan we discussed and let me know if I can assist you in the future.   These are the goals we discussed:  Look for copy of [place] (important to know where these are kept - you can also bring copy to our office to be placed in our file / electronic chart)  Will give both flu and last pneumonia vaccines at your visit in October with Dr Sabra Heck.  I have sent referral to [facility] to have CT of lungs - this is a screening to check for lung cancer.   Increase non-starchy vegetables - carrots, green bean, squash, zucchini, tomatoes, onions, peppers, spinach and other green leafy vegetables, cabbage, lettuce, cucumbers, asparagus, okra (not fried), eggplant Limit sugar and processed foods (cakes, cookies, ice cream, crackers and chips) Increase fresh fruit but limit serving sizes 1/2 cup or about the size of tennis or baseball Try to limit salt intake - fresh or frozen vegetables are best but if you choose canned food make sure to choose low sodium / salt options and rinse vegetables.   Limit red meat to no more than 1-2 times per week (serving size about the size of your palm) Choose whole grains / lean proteins - whole wheat bread, quinoa, whole grain rice (1/2 cup), fish, chicken, [place] Avoid sugar and calorie containing beverages - soda, sweet tea and juice.  Choose water or unsweetened tea instead.    This is a list of the screening recommended for you and due dates:  Health Maintenance  Topic Date Due  . Tetanus Vaccine  08/01/1958  . Shingles Vaccine  08/02/1999  . Flu Shot  06/13/2016  . Pneumonia vaccines (2 of 2 - PPSV23) 07/22/2016  . Colon Cancer Screening  04/06/2019     Health Maintenance, Male A healthy lifestyle and preventative care can promote  health and wellness.  Maintain regular health, dental, and eye exams.  Eat a healthy diet. Foods like vegetables, fruits, whole grains, low-fat dairy products, and lean protein foods contain the nutrients you need and are low in calories. Decrease your intake of foods high in solid fats, added sugars, and salt. Get information about a proper diet from your health care provider, if necessary.  Regular physical exercise is one of the most important things you can do for your health. Most adults should get at least 150 minutes of moderate-intensity exercise (any activity that increases your heart rate and causes you to sweat) each week. In addition, most adults need muscle-strengthening exercises on 2 or more days a week.   Maintain a healthy weight. The body mass index (BMI) is a screening tool to identify possible weight problems. It provides an estimate of body fat based on height and weight. Your health care provider can find your BMI and can help you achieve or maintain a healthy weight. For males 20 years and older:  A BMI below 18.5 is considered underweight.  A BMI of 18.5 to 24.9 is normal.  A BMI of 25 to 29.9 is considered overweight.  A BMI of 30 and above is considered obese.  Maintain normal blood lipids and cholesterol by exercising and minimizing your intake of saturated fat. Eat a balanced diet with plenty of fruits and vegetables. Blood tests for lipids and cholesterol should begin at  age 4 and be repeated every 5 years. If your lipid or cholesterol levels are high, you are over age 12, or you are at high risk for heart disease, you may need your cholesterol levels checked more frequently.Ongoing high lipid and cholesterol levels should be treated with medicines if diet and exercise are not working.  If you smoke, find out from your health care provider how to quit. If you do not use tobacco, do not start.  Lung cancer screening is recommended for adults aged 58-80 years who  are at high risk for developing lung cancer because of a history of smoking. A yearly low-dose CT scan of the lungs is recommended for people who have at least a 30-pack-year history of smoking and are current smokers or have quit within the past 15 years. A pack year of smoking is smoking an average of 1 pack of cigarettes a day for 1 year (for example, a 30-pack-year history of smoking could mean smoking 1 pack a day for 30 years or 2 packs a day for 15 years). Yearly screening should continue until the smoker has stopped smoking for at least 15 years. Yearly screening should be stopped for people who develop a health problem that would prevent them from having lung cancer treatment.  If you choose to drink alcohol, do not have more than 2 drinks per day. One drink is considered to be 12 oz (360 mL) of beer, 5 oz (150 mL) of wine, or 1.5 oz (45 mL) of liquor.  Avoid the use of street drugs. Do not share needles with anyone. Ask for help if you need support or instructions about stopping the use of drugs.  High blood pressure causes heart disease and increases the risk of stroke. High blood pressure is more likely to develop in:  People who have blood pressure in the end of the normal range (100-139/85-89 mm Hg).  People who are overweight or obese.  People who are African American.  If you are 33-59 years of age, have your blood pressure checked every 3-5 years. If you are 49 years of age or older, have your blood pressure checked every year. You should have your blood pressure measured twice--once when you are at a hospital or clinic, and once when you are not at a hospital or clinic. Record the average of the two measurements. To check your blood pressure when you are not at a hospital or clinic, you can use:  An automated blood pressure machine at a pharmacy.  A home blood pressure monitor.  If you are 21-18 years old, ask your health care provider if you should take aspirin to prevent heart  disease.  Diabetes screening involves taking a blood sample to check your fasting blood sugar level. This should be done once every 3 years after age 62 if you are at a normal weight and without risk factors for diabetes. Testing should be considered at a younger age or be carried out more frequently if you are overweight and have at least 1 risk factor for diabetes.  Colorectal cancer can be detected and often prevented. Most routine colorectal cancer screening begins at the age of 76 and continues through age 43. However, your health care provider may recommend screening at an earlier age if you have risk factors for colon cancer. On a yearly basis, your health care provider may provide home test kits to check for hidden blood in the stool. A small camera at the end of a tube  may be used to directly examine the colon (sigmoidoscopy or colonoscopy) to detect the earliest forms of colorectal cancer. Talk to your health care provider about this at age 87 when routine screening begins. A direct exam of the colon should be repeated every 5-10 years through age 29, unless early forms of precancerous polyps or small growths are found.  People who are at an increased risk for hepatitis B should be screened for this virus. You are considered at high risk for hepatitis B if:  You were born in a country where hepatitis B occurs often. Talk with your health care provider about which countries are considered high risk.  Your parents were born in a high-risk country and you have not received a shot to protect against hepatitis B (hepatitis B vaccine).  You have HIV or AIDS.  You use needles to inject street drugs.  You live with, or have sex with, someone who has hepatitis B.  You are a man who has sex with other men (MSM).  You get hemodialysis treatment.  You take certain medicines for conditions like cancer, organ transplantation, and autoimmune conditions.  Hepatitis C blood testing is recommended  for all people born from [birth year] through 1967 and any individual with known risk factors for hepatitis C.  Healthy men should no longer receive prostate-specific antigen (PSA) blood tests as part of routine cancer screening. Talk to your health care provider about prostate cancer screening.  Testicular cancer screening is not recommended for adolescents or adult males who have no symptoms. Screening includes self-exam, a health care provider exam, and other screening tests. Consult with your health care provider about any symptoms you have or any concerns you have about testicular cancer.  Practice safe sex. Use condoms and avoid high-risk sexual practices to reduce the spread of sexually transmitted infections (STIs).  You should be screened for STIs, including gonorrhea and chlamydia if:  You are sexually active and are younger than 24 years.  You are older than 24 years, and your health care provider tells you that you are at risk for this type of infection.  Your sexual activity has changed since you were last screened, and you are at an increased risk for chlamydia or gonorrhea. Ask your health care provider if you are at risk.  If you are at risk of being infected with HIV, it is recommended that you take a prescription medicine daily to prevent HIV infection. This is called pre-exposure prophylaxis (PrEP). You are considered at risk if:  You are a man who has sex with other men (MSM).  You are a heterosexual man who is sexually active with multiple partners.  You take drugs by injection.  You are sexually active with a partner who has HIV.  Talk with your health care provider about whether you are at high risk of being infected with HIV. If you choose to begin PrEP, you should first be tested for HIV. You should then be tested every 3 months for as long as you are taking PrEP.  Use sunscreen. Apply sunscreen liberally and repeatedly throughout the day. You should seek shade when your  shadow is shorter than you. Protect yourself by wearing long sleeves, pants, a wide-brimmed hat, and sunglasses year round whenever you are outdoors.  Tell your health care provider of new moles or changes in moles, especially if there is a change in shape or color. Also, tell your health care provider if a mole is larger than the size of  a pencil eraser.  A one-time screening for abdominal aortic aneurysm (AAA) and surgical repair of large AAAs by ultrasound is recommended for men aged 24-75 years who are current or former smokers.  Stay current with your vaccines (immunizations).   This information is not intended to replace advice given to you by your health care provider. Make sure you discuss any questions you have with your health care provider.   Document Released: 04/27/2008 Document Revised: 11/20/2014 Document Reviewed: 03/27/2011 Elsevier Interactive Patient Education [employer].

## 2016-07-25 LAB — CBC WITH DIFFERENTIAL/PLATELET
BASOS ABS: 0 10*3/uL (ref 0.0–0.2)
Basos: 1 %
EOS (ABSOLUTE): 0.2 10*3/uL (ref 0.0–0.4)
Eos: 2 %
Hematocrit: 36.5 % — ABNORMAL LOW (ref 37.5–51.0)
Hemoglobin: 12.5 g/dL — ABNORMAL LOW (ref 12.6–17.7)
Immature Grans (Abs): 0 10*3/uL (ref 0.0–0.1)
Immature Granulocytes: 0 %
LYMPHS ABS: 1.5 10*3/uL (ref 0.7–3.1)
Lymphs: 22 %
MCH: 32.8 pg (ref 26.6–33.0)
MCHC: 34.2 g/dL (ref 31.5–35.7)
MCV: 96 fL (ref 79–97)
MONOCYTES: 10 %
MONOS ABS: 0.7 10*3/uL (ref 0.1–0.9)
Neutrophils Absolute: 4.3 10*3/uL (ref 1.4–7.0)
Neutrophils: 65 %
PLATELETS: 178 10*3/uL (ref 150–379)
RBC: 3.81 x10E6/uL — AB (ref 4.14–5.80)
RDW: 15.4 % (ref 12.3–15.4)
WBC: 6.6 10*3/uL (ref 3.4–10.8)

## 2016-07-25 LAB — LIPID PANEL
CHOL/HDL RATIO: 3 ratio (ref 0.0–5.0)
Cholesterol, Total: 110 mg/dL (ref 100–199)
HDL: 37 mg/dL — AB (ref >39)
LDL CALC: 59 mg/dL (ref 0–99)
Triglycerides: 68 mg/dL (ref 0–149)
VLDL CHOLESTEROL CAL: 14 mg/dL (ref 5–40)

## 2016-07-25 LAB — DIGOXIN LEVEL: DIGOXIN, SERUM: 1.6 ng/mL — AB (ref 0.5–0.9)

## 2016-07-27 ENCOUNTER — Other Ambulatory Visit: Payer: Self-pay | Admitting: Pharmacist

## 2016-07-27 MED ORDER — DIGOXIN 125 MCG PO TABS: 125.0000 ug | ORAL_TABLET | ORAL | 0 refills | Status: DC

## 2016-08-11 ENCOUNTER — Other Ambulatory Visit: Payer: Self-pay | Admitting: Family Medicine

## 2016-08-15 ENCOUNTER — Encounter: Payer: Self-pay | Admitting: Family Medicine

## 2016-08-15 ENCOUNTER — Ambulatory Visit (INDEPENDENT_AMBULATORY_CARE_PROVIDER_SITE_OTHER): Payer: Commercial Managed Care - HMO | Admitting: Family Medicine

## 2016-08-15 VITALS — BP 123/61 | HR 49 | Temp 97.0°F | Ht 67.75 in | Wt 153.0 lb

## 2016-08-15 DIAGNOSIS — N183 Chronic kidney disease, stage 3 unspecified: Secondary | ICD-10-CM

## 2016-08-15 DIAGNOSIS — J449 Chronic obstructive pulmonary disease, unspecified: Secondary | ICD-10-CM

## 2016-08-15 DIAGNOSIS — I10 Essential (primary) hypertension: Secondary | ICD-10-CM

## 2016-08-15 DIAGNOSIS — I4891 Unspecified atrial fibrillation: Secondary | ICD-10-CM | POA: Diagnosis not present

## 2016-08-15 MED ORDER — LOSARTAN POTASSIUM 50 MG PO TABS: 50.0000 mg | ORAL_TABLET | Freq: Every day | ORAL | 1 refills | Status: DC

## 2016-08-15 MED ORDER — CLONAZEPAM 1 MG PO TABS: ORAL_TABLET | ORAL | 1 refills | Status: DC

## 2016-08-15 NOTE — Progress Notes (Signed)
Subjective:    Patient ID: Don [name], male    DOB: December 25, 1940, 75 y.o.   MRN: [id number]  HPI 75 year old gentleman here for follow-up of chronic problems including chronic kidney disease, hypertension, atrial fibrillation, and COPD. Patient reports increasing cough recently He sees a nephrologist regularly for his chronic kidney disease. GFR is 28 which puts him at stage IV kidney disease. For his atrial fibrillation he is on rate controlling medicines diltiazem and metoprolol but he is also on digoxin every other day. Pro times are monitored here.  Patient Active Problem List   Diagnosis Date Noted  . Pre-diabetes 07/23/2015  . Metabolic syndrome 50/56/9794  . Urinary bladder incontinence 03/04/2015  . Paroxysmal atrial fibrillation ([place]) 05/25/2014  . Hiatal hernia 04/06/2014  . Other and unspecified hyperlipidemia 04/04/2014  . Personal history of colonic polyps 04/04/2014  . History of prostate cancer 04/03/2014  . Stage III chronic kidney disease 04/03/2014  . Tenosynovitis of thumb 10/16/2013  . Atrial fibrillation ([place]) 05/19/2013  . Tobacco abuse 04/25/2013  . Hernia 02/08/2011  . Prostate cancer ([place]) 02/08/2011  . COPD (chronic obstructive pulmonary disease) ([place]) 02/08/2011  . Hypertension 02/08/2011   Outpatient Encounter Prescriptions as of 08/15/2016  Medication Sig  . atorvastatin (LIPITOR) 20 MG tablet TAKE 1 TABLET DAILY  . digoxin (LANOXIN) 0.125 MG tablet Take 1 tablet (125 mcg total) by mouth every other day.  . diltiazem (CARDIZEM CD) 300 MG 24 hr capsule TAKE (1) CAPSULE DAILY  . DULoxetine (CYMBALTA) 60 MG capsule TAKE (1) CAPSULE DAILY  . furosemide (LASIX) 40 MG tablet Take 1 tablet by mouth every morning.  [name] lisinopril (PRINIVIL,ZESTRIL) 10 MG tablet Take 10 mg by mouth daily.  . metoprolol tartrate (LOPRESSOR) 25 MG tablet TAKE (1) TABLET TWICE A DAY.  [name] omega-3 acid ethyl esters (LOVAZA) 1 G capsule TAKE (2) CAPSULES TWICE DAILY.  . pantoprazole  (PROTONIX) 40 MG tablet TAKE 1 TABLET DAILY  . warfarin (COUMADIN) 4 MG tablet TAKE 1 TO 1&1/2 TABLETS DAILY AS DIRECTED BY CLINIC  . albuterol (PROVENTIL HFA;VENTOLIN HFA) 108 (90 BASE) MCG/ACT inhaler Inhale 2 puffs into the lungs every 6 (six) hours as needed for wheezing or shortness of breath.  . ALLERGY RELIEF 10 MG tablet TAKE 1 TABLET DAILY FOR ALLERGY  . budesonide-formoterol (SYMBICORT) 160-4.5 MCG/ACT inhaler Inhale 2 puffs into the lungs 2 (two) times daily.  . clonazePAM (KLONOPIN) 1 MG tablet TAKE 1/2 TABLET ONCE DAILY AS NEEDED FOR ANXIETY  . doxylamine, Sleep, (UNISOM) 25 MG tablet Take 25 mg by mouth at bedtime as needed.  [name] FERREX 150 150 MG capsule Take 1 capsule by mouth daily.  . Incontinence Supply Disposable (INCONTINENCE BRIEF LARGE) MISC Use as needed for urinary incontinence.  Dx: urinary incontinence R32 and prostate cancer C61  . Multiple Vitamin (MULTIVITAMIN WITH MINERALS) TABS Take 1 tablet by mouth daily.  . nitroGLYCERIN (NITROSTAT) 0.4 MG SL tablet Place 1 tablet (0.4 mg total) under the tongue every 5 (five) minutes as needed for chest pain. (Patient not taking: Reported on 07/24/2016)  . Polyethyl Glycol-Propyl Glycol (SYSTANE OP) Place 1 drop into both eyes 2 (two) times daily as needed (dry eyes).   . Probiotic Product (PRO-BIOTIC BLEND) CAPS Take 1 capsule by mouth daily. (Patient not taking: Reported on 07/24/2016)  . terazosin (HYTRIN) 2 MG capsule TAKE 1 CAPSULE AT BEDTIME  . tiotropium (SPIRIVA) 18 MCG inhalation capsule Place 1 capsule (18 mcg total) into inhaler and inhale daily.  [name]  triamcinolone cream (KENALOG) 0.1 % Apply 1 application topically 2 (two) times daily.   No facility-administered encounter medications on file as of 08/15/2016.       Review of Systems  Constitutional: Negative.   HENT: Negative.   Respiratory: Positive for cough.   Cardiovascular: Positive for leg swelling.  Gastrointestinal: Negative.   Genitourinary: Negative.     Neurological: Negative.   Psychiatric/Behavioral: Negative.        Objective:   Physical Exam  Constitutional: He is oriented to person, place, and time. He appears well-developed and well-nourished.  Cardiovascular: Normal rate and normal heart sounds.   Pulmonary/Chest: Effort normal. He has wheezes.  Abdominal: Soft. Bowel sounds are normal.  Musculoskeletal: He exhibits no edema.  Neurological: He is alert and oriented to person, place, and time.  Skin:  He has several keratosis on his arms and one on his foot which he requests the frozen. This was done with liquid nitrogen.  Psychiatric: He has a normal mood and affect. His behavior is normal.   BP 123/61   Pulse (!) 49   Temp 97 F (36.1 C) (Oral)   Ht 5' 7.75" (1.721 m)   Wt 153 lb (69.4 kg)   BMI 23.44 kg/m         Assessment & Plan:   1. Atrial fibrillation, unspecified type ([place]) Rate is controlled and anticoagulation is monitored. I am stopping digoxin since he is on rate control drugs diltiazem and metoprolol. I think the downside to digoxin outweighs the up side.  2. Essential hypertension Blood pressure is well controlled at 123/61. He does have some cough. Will switch to losartan and off lisinopril in case that is a contributor but his COPD is probably the biggest reason he coughs  3. Chronic obstructive pulmonary disease, unspecified COPD type ([place]) He does have cough. He continues to smoke one pack a day.  4. Stage III chronic kidney disease Problem is being managed by nephrology  Wardell Honour MD

## 2016-08-31 ENCOUNTER — Other Ambulatory Visit: Payer: Self-pay | Admitting: Family Medicine

## 2016-09-15 ENCOUNTER — Telehealth: Payer: Self-pay | Admitting: Pharmacist

## 2016-09-18 NOTE — Telephone Encounter (Signed)
Called Mr. Dejaynes.  There is no new information regarding his wife.  She is in nursing home currently and has been there for 2 years. He visits her daily.  Her children have voiced an opinion that they think she should be at home but patient is unable to take care of her.  This stresses patient from time to time.  His wife's daughter actually has health care POA.   I recommended patient for him to stay in communication with his wife's family and the nursing home.  He will attend family meeting this week.

## 2016-09-19 ENCOUNTER — Ambulatory Visit (INDEPENDENT_AMBULATORY_CARE_PROVIDER_SITE_OTHER): Payer: Commercial Managed Care - HMO | Admitting: Pharmacist

## 2016-09-19 ENCOUNTER — Other Ambulatory Visit: Payer: Self-pay | Admitting: Pharmacist

## 2016-09-19 DIAGNOSIS — F17210 Nicotine dependence, cigarettes, uncomplicated: Secondary | ICD-10-CM

## 2016-09-19 DIAGNOSIS — Z23 Encounter for immunization: Secondary | ICD-10-CM

## 2016-09-19 DIAGNOSIS — L859 Epidermal thickening, unspecified: Secondary | ICD-10-CM

## 2016-09-19 DIAGNOSIS — I480 Paroxysmal atrial fibrillation: Secondary | ICD-10-CM

## 2016-09-19 LAB — COAGUCHEK XS/INR WAIVED
INR: 1.6 — AB (ref 0.9–1.1)
Prothrombin Time: 19.5 s

## 2016-09-19 MED ORDER — INCONTINENCE BRIEF LARGE MISC: 1 refills | Status: DC

## 2016-09-19 MED ORDER — TRIAMCINOLONE ACETONIDE 0.1 % EX CREA: 1.0000 "application " | TOPICAL_CREAM | Freq: Two times a day (BID) | CUTANEOUS | 0 refills | Status: DC

## 2016-09-19 NOTE — Telephone Encounter (Signed)
rx sent to [facility]

## 2016-09-20 ENCOUNTER — Telehealth: Payer: Self-pay | Admitting: Family Medicine

## 2016-09-20 ENCOUNTER — Encounter: Payer: Self-pay | Admitting: Physician Assistant

## 2016-09-20 ENCOUNTER — Other Ambulatory Visit: Payer: Self-pay | Admitting: Family Medicine

## 2016-09-20 ENCOUNTER — Ambulatory Visit (INDEPENDENT_AMBULATORY_CARE_PROVIDER_SITE_OTHER): Payer: Commercial Managed Care - HMO | Admitting: Physician Assistant

## 2016-09-20 VITALS — BP 115/69 | HR 82 | Temp 97.1°F | Ht 67.75 in | Wt 153.0 lb

## 2016-09-20 DIAGNOSIS — B349 Viral infection, unspecified: Secondary | ICD-10-CM

## 2016-09-20 DIAGNOSIS — J069 Acute upper respiratory infection, unspecified: Secondary | ICD-10-CM

## 2016-09-20 DIAGNOSIS — R52 Pain, unspecified: Secondary | ICD-10-CM

## 2016-09-20 LAB — VERITOR FLU A/B WAIVED
INFLUENZA A: NEGATIVE
INFLUENZA B: NEGATIVE

## 2016-09-20 MED ORDER — DOXYCYCLINE HYCLATE 100 MG PO TABS: 100.0000 mg | ORAL_TABLET | Freq: Two times a day (BID) | ORAL | 0 refills | Status: DC

## 2016-09-20 NOTE — Progress Notes (Signed)
BP 115/69   Pulse 82   Temp 97.1 F (36.2 C) (Oral)   Ht 5' 7.75" (1.721 m)   Wt 153 lb (69.4 kg)   BMI 23.44 kg/m    Subjective:    Patient ID: Don [name], male    DOB: 12-02-1940, 75 y.o.   MRN: [id number]  HPI: Don [name] is a 76 y.o. male presenting on 09/20/2016 for Diarrhea; abdominal swelling; and body aches  He has been having aches and headache for about 3 days. Denies fever or chills. Some abdominal pain and diarrhea. Does not have mucus or blood in stool. Some feverish times. Lots of post nasal drainage. Concerned about having the flu and going to the nursing home with his wife.  Relevant past medical, surgical, family and social history reviewed and updated as indicated. Allergies and medications reviewed and updated.  Past Medical History:  Diagnosis Date  . Acute bronchitis 04/03/2014  . Anxiety   . Atrial fibrillation with RVR ([place]) 04/03/2014  . Cataract   . COPD (chronic obstructive pulmonary disease) ([place])   . Hyperlipidemia   . Hypertension   . Macular degeneration   . Noncompliance with medications 04/2013   xarelto, digoxin  . Pre-diabetes   . Prostate cancer ([place])   . Stage III chronic kidney disease 04/03/2014  . Tobacco abuse    ongoing  . Tubular adenoma of colon 07/31/02, 11/18/03    Past Surgical History:  Procedure Laterality Date  . Anal abcess,Hemorroids,    . COLONOSCOPY N/A 04/05/2014   Procedure: COLONOSCOPY;  Surgeon: Ladene Artist, MD;  Location: [facility];  Service: Endoscopy;  Laterality: N/A;  . COLONOSCOPY W/ BIOPSIES  11/18/2003   Dr. Earle Gell  . ESOPHAGOGASTRODUODENOSCOPY  11/18/2003   Dr. Earle Gell  . ESOPHAGOGASTRODUODENOSCOPY N/A 04/05/2014   Procedure: ESOPHAGOGASTRODUODENOSCOPY (EGD);  Surgeon: Ladene Artist, MD;  Location: [facility];  Service: Endoscopy;  Laterality: N/A;  . RETROPUBIC PROSTATECTOMY  11/26/2001  . TONSILLECTOMY      Review of Systems  Constitutional: Positive for fatigue and  fever. Negative for appetite change and unexpected weight change.  HENT: Positive for sinus pressure and sore throat.   Eyes: Negative.  Negative for pain and visual disturbance.  Respiratory: Positive for shortness of breath and wheezing. Negative for cough and chest tightness.   Cardiovascular: Negative.  Negative for chest pain, palpitations and leg swelling.  Gastrointestinal: Positive for diarrhea. Negative for abdominal pain, blood in stool, nausea and vomiting.  Endocrine: Negative.   Genitourinary: Negative.   Musculoskeletal: Positive for back pain and myalgias.  Skin: Negative.  Negative for color change and rash.  Neurological: Positive for headaches. Negative for weakness and numbness.  Psychiatric/Behavioral: Negative.       Medication List       Accurate as of 09/20/16  3:31 PM. Always use your most recent med list.          albuterol 108 (90 Base) MCG/ACT inhaler Commonly known as:  PROVENTIL HFA;VENTOLIN HFA Inhale 2 puffs into the lungs every 6 (six) hours as needed for wheezing or shortness of breath.   ALLERGY RELIEF 10 MG tablet Generic drug:  loratadine TAKE 1 TABLET DAILY FOR ALLERGY   atorvastatin 20 MG tablet Commonly known as:  LIPITOR TAKE 1 TABLET DAILY   budesonide-formoterol 160-4.5 MCG/ACT inhaler Commonly known as:  SYMBICORT Inhale 2 puffs into the lungs 2 (two) times daily.   clonazePAM 1 MG tablet Commonly known as:  KLONOPIN TAKE  1/2 TABLET ONCE DAILY AS NEEDED FOR ANXIETY   diltiazem 300 MG 24 hr capsule Commonly known as:  CARDIZEM CD TAKE (1) CAPSULE DAILY   doxycycline 100 MG tablet Commonly known as:  VIBRA-TABS Take 1 tablet (100 mg total) by mouth 2 (two) times daily.   doxylamine (Sleep) 25 MG tablet Commonly known as:  UNISOM Take 25 mg by mouth at bedtime as needed.   DULoxetine 60 MG capsule Commonly known as:  CYMBALTA TAKE (1) CAPSULE DAILY   FERREX 150 150 MG capsule Generic drug:  iron polysaccharides Take 1  capsule by mouth daily.   furosemide 40 MG tablet Commonly known as:  LASIX Take 1 tablet by mouth every morning.   Incontinence Brief Large Misc Use as needed for urinary incontinence.  Dx: urinary incontinence R32 and prostate cancer C61   losartan 50 MG tablet Commonly known as:  COZAAR Take 1 tablet (50 mg total) by mouth daily.   metoprolol tartrate 25 MG tablet Commonly known as:  LOPRESSOR TAKE (1) TABLET TWICE A DAY.   multivitamin with minerals Tabs tablet Take 1 tablet by mouth daily.   nitroGLYCERIN 0.4 MG SL tablet Commonly known as:  NITROSTAT Place 1 tablet (0.4 mg total) under the tongue every 5 (five) minutes as needed for chest pain.   omega-3 acid ethyl esters 1 g capsule Commonly known as:  LOVAZA TAKE (2) CAPSULES TWICE DAILY.   pantoprazole 40 MG tablet Commonly known as:  PROTONIX TAKE 1 TABLET DAILY   PRO-BIOTIC BLEND Caps Take 1 capsule by mouth daily.   SYSTANE OP Place 1 drop into both eyes 2 (two) times daily as needed (dry eyes).   terazosin 2 MG capsule Commonly known as:  HYTRIN TAKE 1 CAPSULE AT BEDTIME   tiotropium 18 MCG inhalation capsule Commonly known as:  SPIRIVA Place 1 capsule (18 mcg total) into inhaler and inhale daily.   triamcinolone cream 0.1 % Commonly known as:  KENALOG Apply 1 application topically 2 (two) times daily.   warfarin 4 MG tablet Commonly known as:  COUMADIN TAKE 1 TO 1&1/2 TABLETS DAILY AS DIRECTED BY CLINIC          Objective:    BP 115/69   Pulse 82   Temp 97.1 F (36.2 C) (Oral)   Ht 5' 7.75" (1.721 m)   Wt 153 lb (69.4 kg)   BMI 23.44 kg/m   Allergies  Allergen Reactions  . Wellbutrin [Bupropion] Anxiety    Physical Exam  Constitutional: He appears well-developed and well-nourished.  HENT:  Head: Normocephalic and atraumatic.  Right Ear: Hearing and tympanic membrane normal.  Left Ear: Hearing and tympanic membrane normal.  Nose: Mucosal edema and sinus tenderness present. No  nasal deformity. Right sinus exhibits no frontal sinus tenderness. Left sinus exhibits no frontal sinus tenderness.  Mouth/Throat: Posterior oropharyngeal erythema present.  Eyes: Conjunctivae and EOM are normal. Pupils are equal, round, and reactive to light. Right eye exhibits no discharge. Left eye exhibits no discharge.  Neck: Normal range of motion. Neck supple.  Cardiovascular: Normal rate, regular rhythm and normal heart sounds.   Pulmonary/Chest: Effort normal. No respiratory distress. He has no decreased breath sounds. He has no wheezes. He has no rhonchi. He has no rales.  Abdominal: Soft. Bowel sounds are normal.  Musculoskeletal: Normal range of motion.  Skin: Skin is warm and dry.        Assessment & Plan:   1. Body aches Tylenol as needed - Veritor Flu  A/B Waived  2. Acute upper respiratory infection Support, if mucus or sinus symptoms become worse may start doxycycline '100mg'$  1 BID, printed script given  3. Viral illness    Continue all other maintenance medications as listed above.  Follow up plan: Prn worsening  Orders Placed This Encounter  Procedures  . Veritor Flu A/B [employer] given for URI  Terald Sleeper PA-C [place] [address]  [place], [place] [address] [phone]   09/20/2016, 3:31 PM

## 2016-09-20 NOTE — Telephone Encounter (Signed)
Appointment scheduled to come in to be checked for the flu.

## 2016-09-20 NOTE — Patient Instructions (Signed)
Upper Respiratory Infection, Adult Most upper respiratory infections (URIs) are a viral infection of the air passages leading to the lungs. A URI affects the nose, throat, and upper air passages. The most common type of URI is nasopharyngitis and is typically referred to as "the common cold." URIs run their course and usually go away on their own. Most of the time, a URI does not require medical attention, but sometimes a bacterial infection in the upper airways can follow a viral infection. This is called a secondary infection. Sinus and middle ear infections are common types of secondary upper respiratory infections. Bacterial pneumonia can also complicate a URI. A URI can worsen asthma and chronic obstructive pulmonary disease (COPD). Sometimes, these complications can require emergency medical care and may be life threatening.  CAUSES Almost all URIs are caused by viruses. A virus is a type of germ and can spread from one person to another.  RISKS FACTORS You may be at risk for a URI if:   You smoke.   You have chronic heart or lung disease.  You have a weakened defense (immune) system.   You are very young or very old.   You have nasal allergies or asthma.  You work in crowded or poorly ventilated areas.  You work in health care facilities or schools. SIGNS AND SYMPTOMS  Symptoms typically develop 2-3 days after you come in contact with a cold virus. Most viral URIs last 7-10 days. However, viral URIs from the influenza virus (flu virus) can last 14-18 days and are typically more severe. Symptoms may include:   Runny or stuffy (congested) nose.   Sneezing.   Cough.   Sore throat.   Headache.   Fatigue.   Fever.   Loss of appetite.   Pain in your forehead, behind your eyes, and over your cheekbones (sinus pain).  Muscle aches.  DIAGNOSIS  Your health care provider may diagnose a URI by:  Physical exam.  Tests to check that your symptoms are not due to  another condition such as:  Strep throat.  Sinusitis.  Pneumonia.  Asthma. TREATMENT  A URI goes away on its own with time. It cannot be cured with medicines, but medicines may be prescribed or recommended to relieve symptoms. Medicines may help:  Reduce your fever.  Reduce your cough.  Relieve nasal congestion. HOME CARE INSTRUCTIONS   Take medicines only as directed by your health care provider.   Gargle warm saltwater or take cough drops to comfort your throat as directed by your health care provider.  Use a warm mist humidifier or inhale steam from a shower to increase air moisture. This may make it easier to breathe.  Drink enough fluid to keep your urine clear or pale yellow.   Eat soups and other clear broths and maintain good nutrition.   Rest as needed.   Return to work when your temperature has returned to normal or as your health care provider advises. You may need to stay home longer to avoid infecting others. You can also use a face mask and careful hand washing to prevent spread of the virus.  Increase the usage of your inhaler if you have asthma.   Do not use any tobacco products, including cigarettes, chewing tobacco, or electronic cigarettes. If you need help quitting, ask your health care provider. PREVENTION  The best way to protect yourself from getting a cold is to practice good hygiene.   Avoid oral or hand contact with people with cold   symptoms.   Wash your hands often if contact occurs.  There is no clear evidence that vitamin C, vitamin E, echinacea, or exercise reduces the chance of developing a cold. However, it is always recommended to get plenty of rest, exercise, and practice good nutrition.  SEEK MEDICAL CARE IF:   You are getting worse rather than better.   Your symptoms are not controlled by medicine.   You have chills.  You have worsening shortness of breath.  You have brown or red mucus.  You have yellow or brown nasal  discharge.  You have pain in your face, especially when you bend forward.  You have a fever.  You have swollen neck glands.  You have pain while swallowing.  You have white areas in the back of your throat. SEEK IMMEDIATE MEDICAL CARE IF:   You have severe or persistent:  Headache.  Ear pain.  Sinus pain.  Chest pain.  You have chronic lung disease and any of the following:  Wheezing.  Prolonged cough.  Coughing up blood.  A change in your usual mucus.  You have a stiff neck.  You have changes in your:  Vision.  Hearing.  Thinking.  Mood. MAKE SURE YOU:   Understand these instructions.  Will watch your condition.  Will get help right away if you are not doing well or get worse.   This information is not intended to replace advice given to you by your health care provider. Make sure you discuss any questions you have with your health care provider.   Document Released: 04/25/2001 Document Revised: 03/16/2015 Document Reviewed: 02/04/2014 Elsevier Interactive Patient Education 2016 [employer]vier Inc[employer]  

## 2016-09-22 ENCOUNTER — Telehealth: Payer: Self-pay | Admitting: Family Medicine

## 2016-09-22 NOTE — Telephone Encounter (Signed)
Pt notified he will need to pick up new container Verbalizes understanding

## 2016-09-25 ENCOUNTER — Other Ambulatory Visit: Payer: Commercial Managed Care - HMO

## 2016-09-25 DIAGNOSIS — D509 Iron deficiency anemia, unspecified: Secondary | ICD-10-CM | POA: Diagnosis not present

## 2016-09-25 DIAGNOSIS — R809 Proteinuria, unspecified: Secondary | ICD-10-CM | POA: Diagnosis not present

## 2016-09-25 DIAGNOSIS — Z79899 Other long term (current) drug therapy: Secondary | ICD-10-CM | POA: Diagnosis not present

## 2016-09-25 DIAGNOSIS — E559 Vitamin D deficiency, unspecified: Secondary | ICD-10-CM | POA: Diagnosis not present

## 2016-09-25 DIAGNOSIS — N183 Chronic kidney disease, stage 3 (moderate): Secondary | ICD-10-CM | POA: Diagnosis not present

## 2016-09-25 DIAGNOSIS — I10 Essential (primary) hypertension: Secondary | ICD-10-CM | POA: Diagnosis not present

## 2016-09-27 DIAGNOSIS — R809 Proteinuria, unspecified: Secondary | ICD-10-CM | POA: Diagnosis not present

## 2016-09-27 DIAGNOSIS — I10 Essential (primary) hypertension: Secondary | ICD-10-CM | POA: Diagnosis not present

## 2016-09-27 DIAGNOSIS — D649 Anemia, unspecified: Secondary | ICD-10-CM | POA: Diagnosis not present

## 2016-09-27 DIAGNOSIS — N184 Chronic kidney disease, stage 4 (severe): Secondary | ICD-10-CM | POA: Diagnosis not present

## 2016-09-29 ENCOUNTER — Other Ambulatory Visit (HOSPITAL_COMMUNITY): Payer: Self-pay | Admitting: Nephrology

## 2016-09-29 DIAGNOSIS — N183 Chronic kidney disease, stage 3 unspecified: Secondary | ICD-10-CM

## 2016-10-10 ENCOUNTER — Ambulatory Visit (INDEPENDENT_AMBULATORY_CARE_PROVIDER_SITE_OTHER): Payer: Commercial Managed Care - HMO | Admitting: Pharmacist

## 2016-10-10 DIAGNOSIS — I480 Paroxysmal atrial fibrillation: Secondary | ICD-10-CM | POA: Diagnosis not present

## 2016-10-10 LAB — COAGUCHEK XS/INR WAIVED
INR: 1.6 — ABNORMAL HIGH (ref 0.9–1.1)
Prothrombin Time: 19.2 s

## 2016-10-12 ENCOUNTER — Ambulatory Visit (HOSPITAL_COMMUNITY)
Admission: RE | Admit: 2016-10-12 | Discharge: 2016-10-12 | Disposition: A | Discharge status: Home / Self Care | Payer: Commercial Managed Care - HMO | Source: Ambulatory Visit | Attending: Nephrology | Admitting: Nephrology

## 2016-10-12 DIAGNOSIS — N183 Chronic kidney disease, stage 3 unspecified: Secondary | ICD-10-CM

## 2016-10-12 DIAGNOSIS — N281 Cyst of kidney, acquired: Secondary | ICD-10-CM | POA: Diagnosis not present

## 2016-10-17 DIAGNOSIS — H353212 Exudative age-related macular degeneration, right eye, with inactive choroidal neovascularization: Secondary | ICD-10-CM | POA: Diagnosis not present

## 2016-10-17 DIAGNOSIS — H353124 Nonexudative age-related macular degeneration, left eye, advanced atrophic with subfoveal involvement: Secondary | ICD-10-CM | POA: Diagnosis not present

## 2016-10-17 DIAGNOSIS — H35423 Microcystoid degeneration of retina, bilateral: Secondary | ICD-10-CM | POA: Diagnosis not present

## 2016-10-17 DIAGNOSIS — H35433 Paving stone degeneration of retina, bilateral: Secondary | ICD-10-CM | POA: Diagnosis not present

## 2016-10-18 ENCOUNTER — Telehealth: Payer: Self-pay | Admitting: Family Medicine

## 2016-10-18 ENCOUNTER — Other Ambulatory Visit: Payer: Self-pay | Admitting: Family Medicine

## 2016-10-18 NOTE — Telephone Encounter (Signed)
Patient states that Dr. Berton Bon ordered ultra sound and he was advised that he needs to call their office for results.

## 2016-10-18 NOTE — Telephone Encounter (Signed)
Please review and advise Last seen 08/15/2016

## 2016-10-18 NOTE — Telephone Encounter (Signed)
Pt called

## 2016-10-19 NOTE — Telephone Encounter (Signed)
Refill called to [facility] 

## 2016-10-24 ENCOUNTER — Ambulatory Visit (INDEPENDENT_AMBULATORY_CARE_PROVIDER_SITE_OTHER): Payer: Commercial Managed Care - HMO | Admitting: Pharmacist

## 2016-10-24 ENCOUNTER — Other Ambulatory Visit: Payer: Self-pay | Admitting: Pharmacist

## 2016-10-24 DIAGNOSIS — I4891 Unspecified atrial fibrillation: Secondary | ICD-10-CM

## 2016-10-24 DIAGNOSIS — I480 Paroxysmal atrial fibrillation: Secondary | ICD-10-CM

## 2016-10-24 LAB — COAGUCHEK XS/INR WAIVED
INR: 1.6 — ABNORMAL HIGH (ref 0.9–1.1)
Prothrombin Time: 18.9 s

## 2016-10-24 MED ORDER — WARFARIN SODIUM 4 MG PO TABS: ORAL_TABLET | ORAL | 1 refills | Status: DC

## 2016-10-31 DIAGNOSIS — H353131 Nonexudative age-related macular degeneration, bilateral, early dry stage: Secondary | ICD-10-CM | POA: Diagnosis not present

## 2016-10-31 DIAGNOSIS — H2513 Age-related nuclear cataract, bilateral: Secondary | ICD-10-CM | POA: Diagnosis not present

## 2016-10-31 DIAGNOSIS — H2512 Age-related nuclear cataract, left eye: Secondary | ICD-10-CM | POA: Diagnosis not present

## 2016-10-31 DIAGNOSIS — H25013 Cortical age-related cataract, bilateral: Secondary | ICD-10-CM | POA: Diagnosis not present

## 2016-10-31 DIAGNOSIS — H02839 Dermatochalasis of unspecified eye, unspecified eyelid: Secondary | ICD-10-CM | POA: Diagnosis not present

## 2016-10-31 DIAGNOSIS — H18413 Arcus senilis, bilateral: Secondary | ICD-10-CM | POA: Diagnosis not present

## 2016-11-10 ENCOUNTER — Ambulatory Visit (INDEPENDENT_AMBULATORY_CARE_PROVIDER_SITE_OTHER): Payer: Commercial Managed Care - HMO | Admitting: Pharmacist

## 2016-11-10 DIAGNOSIS — I480 Paroxysmal atrial fibrillation: Secondary | ICD-10-CM

## 2016-11-10 DIAGNOSIS — L859 Epidermal thickening, unspecified: Secondary | ICD-10-CM

## 2016-11-10 LAB — COAGUCHEK XS/INR WAIVED
INR: 1.8 — AB (ref 0.9–1.1)
PROTHROMBIN TIME: 22 s

## 2016-11-10 MED ORDER — TRIAMCINOLONE ACETONIDE 0.1 % EX CREA: 1.0000 "application " | TOPICAL_CREAM | Freq: Two times a day (BID) | CUTANEOUS | 0 refills | Status: DC

## 2016-11-10 NOTE — Patient Instructions (Signed)
Anticoagulation Dose Instructions as of 11/10/2016      Don [name] Tue Wed Thu Fri Sat   New Dose 4 mg 6 mg 6 mg 6 mg 6 mg 6 mg 6 mg    Description   Increase warfarin '4mg'$  to the following - take 1 tablet on Sundays.  Take 1 and 1/2 tablet all other days.   INR was 1.8 today

## 2016-11-17 ENCOUNTER — Other Ambulatory Visit: Payer: Self-pay | Admitting: Family Medicine

## 2016-11-28 ENCOUNTER — Encounter: Payer: Self-pay | Admitting: Pharmacist

## 2016-11-28 ENCOUNTER — Ambulatory Visit (INDEPENDENT_AMBULATORY_CARE_PROVIDER_SITE_OTHER): Payer: Medicare HMO | Admitting: Pharmacist

## 2016-11-28 DIAGNOSIS — I480 Paroxysmal atrial fibrillation: Secondary | ICD-10-CM

## 2016-11-28 LAB — COAGUCHEK XS/INR WAIVED
INR: 2.6 — ABNORMAL HIGH (ref 0.9–1.1)
Prothrombin Time: 31.8 s

## 2016-12-02 ENCOUNTER — Other Ambulatory Visit: Payer: Self-pay | Admitting: Family Medicine

## 2016-12-13 ENCOUNTER — Other Ambulatory Visit: Payer: Self-pay | Admitting: Family Medicine

## 2016-12-14 NOTE — Telephone Encounter (Signed)
Last filled 11/17/16, last seen 08/15/16. Call in

## 2016-12-15 NOTE — Telephone Encounter (Signed)
Refill called to [facility] 

## 2016-12-18 ENCOUNTER — Ambulatory Visit (INDEPENDENT_AMBULATORY_CARE_PROVIDER_SITE_OTHER): Payer: Medicare HMO | Admitting: Pharmacist

## 2016-12-18 DIAGNOSIS — I480 Paroxysmal atrial fibrillation: Secondary | ICD-10-CM

## 2016-12-18 LAB — COAGUCHEK XS/INR WAIVED
INR: 2.9 — ABNORMAL HIGH (ref 0.9–1.1)
Prothrombin Time: 34.6 s

## 2016-12-25 DIAGNOSIS — H353133 Nonexudative age-related macular degeneration, bilateral, advanced atrophic without subfoveal involvement: Secondary | ICD-10-CM | POA: Diagnosis not present

## 2016-12-25 DIAGNOSIS — H2512 Age-related nuclear cataract, left eye: Secondary | ICD-10-CM | POA: Diagnosis not present

## 2016-12-25 DIAGNOSIS — H35372 Puckering of macula, left eye: Secondary | ICD-10-CM | POA: Diagnosis not present

## 2016-12-25 DIAGNOSIS — Z961 Presence of intraocular lens: Secondary | ICD-10-CM | POA: Diagnosis not present

## 2016-12-25 DIAGNOSIS — H2511 Age-related nuclear cataract, right eye: Secondary | ICD-10-CM | POA: Diagnosis not present

## 2016-12-26 DIAGNOSIS — H2511 Age-related nuclear cataract, right eye: Secondary | ICD-10-CM | POA: Diagnosis not present

## 2016-12-29 ENCOUNTER — Ambulatory Visit (INDEPENDENT_AMBULATORY_CARE_PROVIDER_SITE_OTHER): Payer: Medicare HMO | Admitting: Family Medicine

## 2016-12-29 ENCOUNTER — Encounter: Payer: Self-pay | Admitting: Family Medicine

## 2016-12-29 VITALS — BP 138/85 | HR 90 | Temp 97.0°F | Ht 68.0 in | Wt 170.0 lb

## 2016-12-29 DIAGNOSIS — L03012 Cellulitis of left finger: Secondary | ICD-10-CM

## 2016-12-29 MED ORDER — DOXYCYCLINE MONOHYDRATE 100 MG PO TABS: 100.0000 mg | ORAL_TABLET | Freq: Two times a day (BID) | ORAL | 0 refills | Status: DC

## 2016-12-29 NOTE — Progress Notes (Signed)
BP 138/85   Pulse 90   Temp 97 F (36.1 C) (Oral)   Ht '5\' 8"'$  (1.727 m)   Wt 170 lb (77.1 kg)   BMI 25.85 kg/m    Subjective:    Patient ID: Don [name], male    DOB: 10-03-1941, 76 y.o.   MRN: [id number]  HPI: Don [name] is a 76 y.o. male presenting on 12/29/2016 for Red area to left middle finger   HPI Lesion on left middle finger Patient has a lesion on left middle finger that has been there for about a year but has recently become more inflamed and swollen and painful. He denies any fevers or chills but he said the redness and swelling and pain on the lesion has been more severe and the pain has been going up into his hand just above that middle finger. The lesion has drained some recently but does not usually drain.  Relevant past medical, surgical, family and social history reviewed and updated as indicated. Interim medical history since our last visit reviewed. Allergies and medications reviewed and updated.  Review of Systems  Constitutional: Negative for chills and fever.  Respiratory: Negative for shortness of breath and wheezing.   Cardiovascular: Negative for chest pain and leg swelling.  Musculoskeletal: Negative for back pain and gait problem.  Skin: Positive for color change and wound. Negative for rash.  All other systems reviewed and are negative.   Per HPI unless specifically indicated above     Objective:    BP 138/85   Pulse 90   Temp 97 F (36.1 C) (Oral)   Ht '5\' 8"'$  (1.727 m)   Wt 170 lb (77.1 kg)   BMI 25.85 kg/m   Wt Readings from Last 3 Encounters:  12/29/16 170 lb (77.1 kg)  12/18/16 170 lb (77.1 kg)  11/28/16 167 lb (75.8 kg)    Physical Exam  Constitutional: He is oriented to person, place, and time. He appears well-developed and well-nourished. No distress.  Eyes: Conjunctivae are normal. Right eye exhibits no discharge. Left eye exhibits no discharge. No scleral icterus.  Musculoskeletal: Normal range of motion. He exhibits no  edema.  Neurological: He is alert and oriented to person, place, and time. Coordination normal.  Skin: Skin is warm and dry. Lesion (0.75 cm raised papule that is open in the center with an eschar in the middle with a small amount of surrounding erythema. No drainage, tender to palpation. Capillary refill and sensation intact in the finger. Range of motion intact) noted. No rash noted. He is not diaphoretic.  Psychiatric: He has a normal mood and affect. His behavior is normal.  Nursing note and vitals reviewed.   Results for orders placed or performed in visit on 12/18/16  CoaguChek XS/INR Waived  Result Value Ref Range   INR 2.9 (H) 0.9 - 1.1   Prothrombin Time 34.6 sec      Assessment & Plan:   Problem List Items Addressed This Visit    None    Visit Diagnoses    Cellulitis of finger of left hand    -  Primary   Middle finger, we'll send antibiotic and have come back in one to 2 weeks for Coumadin recheck, if does not resolve then he may need a biopsy of this lesion bec   Relevant Medications   doxycycline (ADOXA) 100 MG tablet       Follow up plan: Return in about 1 week (around 01/05/2017), or if symptoms worsen  or fail to improve, for Return in one to 2 weeks for Coumadin recheck.  Counseling provided for all of the vaccine components No orders of the defined types were placed in this encounter.   Caryl Pina, MD [facility] 12/29/2016, 8:32 AM

## 2017-01-04 ENCOUNTER — Other Ambulatory Visit: Payer: Self-pay | Admitting: Family Medicine

## 2017-01-04 ENCOUNTER — Other Ambulatory Visit: Payer: Self-pay | Admitting: Pharmacist

## 2017-01-05 ENCOUNTER — Emergency Department (HOSPITAL_COMMUNITY): Payer: Medicare HMO

## 2017-01-05 ENCOUNTER — Inpatient Hospital Stay (HOSPITAL_COMMUNITY): Payer: Medicare HMO

## 2017-01-05 ENCOUNTER — Encounter (HOSPITAL_COMMUNITY): Payer: Self-pay

## 2017-01-05 ENCOUNTER — Inpatient Hospital Stay (HOSPITAL_COMMUNITY)
Admission: EM | Admit: 2017-01-05 | Discharge: 2017-01-18 | DRG: 193 | Disposition: A | Discharge status: Home Health | Payer: Medicare HMO | Attending: Family Medicine | Admitting: Family Medicine

## 2017-01-05 DIAGNOSIS — E785 Hyperlipidemia, unspecified: Secondary | ICD-10-CM | POA: Diagnosis present

## 2017-01-05 DIAGNOSIS — Z833 Family history of diabetes mellitus: Secondary | ICD-10-CM

## 2017-01-05 DIAGNOSIS — R059 Cough, unspecified: Secondary | ICD-10-CM

## 2017-01-05 DIAGNOSIS — E861 Hypovolemia: Secondary | ICD-10-CM | POA: Diagnosis not present

## 2017-01-05 DIAGNOSIS — Z8249 Family history of ischemic heart disease and other diseases of the circulatory system: Secondary | ICD-10-CM

## 2017-01-05 DIAGNOSIS — N183 Chronic kidney disease, stage 3 unspecified: Secondary | ICD-10-CM | POA: Diagnosis present

## 2017-01-05 DIAGNOSIS — Z8546 Personal history of malignant neoplasm of prostate: Secondary | ICD-10-CM

## 2017-01-05 DIAGNOSIS — Z7901 Long term (current) use of anticoagulants: Secondary | ICD-10-CM | POA: Diagnosis not present

## 2017-01-05 DIAGNOSIS — Z79899 Other long term (current) drug therapy: Secondary | ICD-10-CM

## 2017-01-05 DIAGNOSIS — I129 Hypertensive chronic kidney disease with stage 1 through stage 4 chronic kidney disease, or unspecified chronic kidney disease: Secondary | ICD-10-CM | POA: Diagnosis present

## 2017-01-05 DIAGNOSIS — N179 Acute kidney failure, unspecified: Secondary | ICD-10-CM | POA: Diagnosis present

## 2017-01-05 DIAGNOSIS — R778 Other specified abnormalities of plasma proteins: Secondary | ICD-10-CM

## 2017-01-05 DIAGNOSIS — J440 Chronic obstructive pulmonary disease with acute lower respiratory infection: Secondary | ICD-10-CM | POA: Diagnosis present

## 2017-01-05 DIAGNOSIS — R809 Proteinuria, unspecified: Secondary | ICD-10-CM | POA: Diagnosis present

## 2017-01-05 DIAGNOSIS — E871 Hypo-osmolality and hyponatremia: Secondary | ICD-10-CM | POA: Diagnosis not present

## 2017-01-05 DIAGNOSIS — Z801 Family history of malignant neoplasm of trachea, bronchus and lung: Secondary | ICD-10-CM

## 2017-01-05 DIAGNOSIS — I4891 Unspecified atrial fibrillation: Secondary | ICD-10-CM | POA: Diagnosis not present

## 2017-01-05 DIAGNOSIS — R600 Localized edema: Secondary | ICD-10-CM | POA: Diagnosis present

## 2017-01-05 DIAGNOSIS — R748 Abnormal levels of other serum enzymes: Secondary | ICD-10-CM | POA: Diagnosis not present

## 2017-01-05 DIAGNOSIS — Z720 Tobacco use: Secondary | ICD-10-CM | POA: Diagnosis not present

## 2017-01-05 DIAGNOSIS — J9601 Acute respiratory failure with hypoxia: Secondary | ICD-10-CM | POA: Diagnosis not present

## 2017-01-05 DIAGNOSIS — E876 Hypokalemia: Secondary | ICD-10-CM | POA: Diagnosis not present

## 2017-01-05 DIAGNOSIS — F17210 Nicotine dependence, cigarettes, uncomplicated: Secondary | ICD-10-CM | POA: Diagnosis present

## 2017-01-05 DIAGNOSIS — I959 Hypotension, unspecified: Secondary | ICD-10-CM | POA: Diagnosis present

## 2017-01-05 DIAGNOSIS — J9801 Acute bronchospasm: Secondary | ICD-10-CM | POA: Diagnosis present

## 2017-01-05 DIAGNOSIS — J441 Chronic obstructive pulmonary disease with (acute) exacerbation: Secondary | ICD-10-CM | POA: Diagnosis present

## 2017-01-05 DIAGNOSIS — N184 Chronic kidney disease, stage 4 (severe): Secondary | ICD-10-CM | POA: Diagnosis present

## 2017-01-05 DIAGNOSIS — R0602 Shortness of breath: Secondary | ICD-10-CM | POA: Diagnosis not present

## 2017-01-05 DIAGNOSIS — R7989 Other specified abnormal findings of blood chemistry: Secondary | ICD-10-CM | POA: Diagnosis not present

## 2017-01-05 DIAGNOSIS — I482 Chronic atrial fibrillation: Secondary | ICD-10-CM | POA: Diagnosis present

## 2017-01-05 DIAGNOSIS — J101 Influenza due to other identified influenza virus with other respiratory manifestations: Principal | ICD-10-CM | POA: Diagnosis present

## 2017-01-05 DIAGNOSIS — R05 Cough: Secondary | ICD-10-CM

## 2017-01-05 DIAGNOSIS — R0603 Acute respiratory distress: Secondary | ICD-10-CM

## 2017-01-05 DIAGNOSIS — R061 Stridor: Secondary | ICD-10-CM | POA: Diagnosis not present

## 2017-01-05 DIAGNOSIS — R739 Hyperglycemia, unspecified: Secondary | ICD-10-CM | POA: Diagnosis not present

## 2017-01-05 DIAGNOSIS — R531 Weakness: Secondary | ICD-10-CM | POA: Diagnosis not present

## 2017-01-05 DIAGNOSIS — H3530 Unspecified macular degeneration: Secondary | ICD-10-CM | POA: Diagnosis present

## 2017-01-05 DIAGNOSIS — T380X5A Adverse effect of glucocorticoids and synthetic analogues, initial encounter: Secondary | ICD-10-CM | POA: Diagnosis not present

## 2017-01-05 DIAGNOSIS — J449 Chronic obstructive pulmonary disease, unspecified: Secondary | ICD-10-CM | POA: Diagnosis not present

## 2017-01-05 DIAGNOSIS — I10 Essential (primary) hypertension: Secondary | ICD-10-CM | POA: Diagnosis present

## 2017-01-05 DIAGNOSIS — R0600 Dyspnea, unspecified: Secondary | ICD-10-CM | POA: Diagnosis not present

## 2017-01-05 DIAGNOSIS — Z7952 Long term (current) use of systemic steroids: Secondary | ICD-10-CM

## 2017-01-05 DIAGNOSIS — K5900 Constipation, unspecified: Secondary | ICD-10-CM | POA: Diagnosis not present

## 2017-01-05 DIAGNOSIS — J209 Acute bronchitis, unspecified: Secondary | ICD-10-CM

## 2017-01-05 DIAGNOSIS — R109 Unspecified abdominal pain: Secondary | ICD-10-CM

## 2017-01-05 DIAGNOSIS — I429 Cardiomyopathy, unspecified: Secondary | ICD-10-CM | POA: Diagnosis present

## 2017-01-05 DIAGNOSIS — R404 Transient alteration of awareness: Secondary | ICD-10-CM | POA: Diagnosis not present

## 2017-01-05 DIAGNOSIS — R42 Dizziness and giddiness: Secondary | ICD-10-CM | POA: Diagnosis not present

## 2017-01-05 DIAGNOSIS — R1031 Right lower quadrant pain: Secondary | ICD-10-CM | POA: Diagnosis not present

## 2017-01-05 HISTORY — DX: Essential (primary) hypertension: I10

## 2017-01-05 LAB — COMPREHENSIVE METABOLIC PANEL
ALBUMIN: 3.2 g/dL — AB (ref 3.5–5.0)
ALT: 18 U/L (ref 17–63)
ANION GAP: 9 (ref 5–15)
AST: 47 U/L — AB (ref 15–41)
Alkaline Phosphatase: 56 U/L (ref 38–126)
BILIRUBIN TOTAL: 1 mg/dL (ref 0.3–1.2)
BUN: 48 mg/dL — AB (ref 6–20)
CHLORIDE: 98 mmol/L — AB (ref 101–111)
CO2: 28 mmol/L (ref 22–32)
Calcium: 8.9 mg/dL (ref 8.9–10.3)
Creatinine, Ser: 3.07 mg/dL — ABNORMAL HIGH (ref 0.61–1.24)
GFR calc Af Amer: 21 mL/min — ABNORMAL LOW (ref >60)
GFR calc non Af Amer: 18 mL/min — ABNORMAL LOW (ref >60)
GLUCOSE: 110 mg/dL — AB (ref 65–99)
POTASSIUM: 4.8 mmol/L (ref 3.5–5.1)
Sodium: 135 mmol/L (ref 135–145)
Total Protein: 6.3 g/dL — ABNORMAL LOW (ref 6.5–8.1)

## 2017-01-05 LAB — CBC WITH DIFFERENTIAL/PLATELET
BASOS ABS: 0 10*3/uL (ref 0.0–0.1)
BASOS PCT: 0 %
Eosinophils Absolute: 0 10*3/uL (ref 0.0–0.7)
Eosinophils Relative: 0 %
HEMATOCRIT: 40.9 % (ref 39.0–52.0)
Hemoglobin: 14 g/dL (ref 13.0–17.0)
Lymphocytes Relative: 8 %
Lymphs Abs: 0.5 10*3/uL — ABNORMAL LOW (ref 0.7–4.0)
MCH: 33.9 pg (ref 26.0–34.0)
MCHC: 34.2 g/dL (ref 30.0–36.0)
MCV: 99 fL (ref 78.0–100.0)
MONO ABS: 0.7 10*3/uL (ref 0.1–1.0)
Monocytes Relative: 11 %
NEUTROS ABS: 4.8 10*3/uL (ref 1.7–7.7)
NEUTROS PCT: 81 %
PLATELETS: 152 10*3/uL (ref 150–400)
RBC: 4.13 MIL/uL — ABNORMAL LOW (ref 4.22–5.81)
RDW: 13.5 % (ref 11.5–15.5)
WBC: 5.9 10*3/uL (ref 4.0–10.5)

## 2017-01-05 LAB — ECHOCARDIOGRAM COMPLETE
Height: 69 in
Weight: 2719.59 oz

## 2017-01-05 LAB — BRAIN NATRIURETIC PEPTIDE: B Natriuretic Peptide: 341 pg/mL — ABNORMAL HIGH (ref 0.0–100.0)

## 2017-01-05 LAB — TROPONIN I
TROPONIN I: 0.03 ng/mL — AB (ref <0.03)
TROPONIN I: 0.04 ng/mL — AB (ref <0.03)
Troponin I: 0.03 ng/mL (ref <0.03)

## 2017-01-05 LAB — MRSA PCR SCREENING: MRSA BY PCR: NEGATIVE

## 2017-01-05 LAB — INFLUENZA PANEL BY PCR (TYPE A & B)
INFLAPCR: POSITIVE — AB
Influenza B By PCR: NEGATIVE

## 2017-01-05 LAB — PROTIME-INR
INR: 1.98
PROTHROMBIN TIME: 22.8 s — AB (ref 11.4–15.2)

## 2017-01-05 LAB — TSH: TSH: 1.23 u[IU]/mL (ref 0.350–4.500)

## 2017-01-05 MED ORDER — DEXTROSE 5 % IV SOLN
INTRAVENOUS | Status: AC
  Administered 2017-01-05: 10 mg/h
  Filled 2017-01-05: qty 100

## 2017-01-05 MED ORDER — METHYLPREDNISOLONE SODIUM SUCC 125 MG IJ SOLR
125.0000 mg | Freq: Once | INTRAMUSCULAR | Status: AC
  Administered 2017-01-05: 125 mg via INTRAVENOUS
  Filled 2017-01-05: qty 2

## 2017-01-05 MED ORDER — DILTIAZEM HCL 100 MG IV SOLR
5.0000 mg/h | INTRAVENOUS | Status: DC
  Administered 2017-01-05 – 2017-01-06 (×2): 5 mg/h via INTRAVENOUS
  Administered 2017-01-06: 12.5 mg/h via INTRAVENOUS
  Administered 2017-01-07: 5 mg/h via INTRAVENOUS
  Administered 2017-01-08: 10 mg/h via INTRAVENOUS
  Filled 2017-01-05: qty 100

## 2017-01-05 MED ORDER — CLONAZEPAM 0.5 MG PO TABS
0.5000 mg | ORAL_TABLET | Freq: Every day | ORAL | Status: DC | PRN
  Administered 2017-01-07 – 2017-01-17 (×12): 0.5 mg via ORAL
  Filled 2017-01-05 (×13): qty 1

## 2017-01-05 MED ORDER — POLYETHYL GLYCOL-PROPYL GLYCOL 0.4-0.3 % OP GEL: Freq: Two times a day (BID) | OPHTHALMIC | Status: DC | PRN

## 2017-01-05 MED ORDER — IPRATROPIUM BROMIDE 0.02 % IN SOLN
0.5000 mg | Freq: Once | RESPIRATORY_TRACT | Status: AC
  Administered 2017-01-05: 0.5 mg via RESPIRATORY_TRACT
  Filled 2017-01-05: qty 2.5

## 2017-01-05 MED ORDER — WARFARIN SODIUM 2 MG PO TABS
6.0000 mg | ORAL_TABLET | Freq: Once | ORAL | Status: AC
  Administered 2017-01-05: 6 mg via ORAL
  Filled 2017-01-05: qty 3
  Filled 2017-01-05: qty 1

## 2017-01-05 MED ORDER — MOMETASONE FURO-FORMOTEROL FUM 200-5 MCG/ACT IN AERO
2.0000 | INHALATION_SPRAY | Freq: Two times a day (BID) | RESPIRATORY_TRACT | Status: DC
  Administered 2017-01-05 – 2017-01-18 (×26): 2 via RESPIRATORY_TRACT
  Filled 2017-01-05: qty 8.8

## 2017-01-05 MED ORDER — SODIUM CHLORIDE 0.9 % IV SOLN
INTRAVENOUS | Status: AC
  Administered 2017-01-05 – 2017-01-06 (×2): via INTRAVENOUS

## 2017-01-05 MED ORDER — TERAZOSIN HCL 1 MG PO CAPS
2.0000 mg | ORAL_CAPSULE | Freq: Every day | ORAL | Status: DC
  Administered 2017-01-05 – 2017-01-17 (×13): 2 mg via ORAL
  Filled 2017-01-05 (×13): qty 2

## 2017-01-05 MED ORDER — PANTOPRAZOLE SODIUM 40 MG PO TBEC
40.0000 mg | DELAYED_RELEASE_TABLET | Freq: Every day | ORAL | Status: DC
  Administered 2017-01-05 – 2017-01-18 (×14): 40 mg via ORAL
  Filled 2017-01-05 (×14): qty 1

## 2017-01-05 MED ORDER — NITROGLYCERIN 0.4 MG SL SUBL: 0.4000 mg | SUBLINGUAL_TABLET | SUBLINGUAL | Status: DC | PRN

## 2017-01-05 MED ORDER — LEVALBUTEROL HCL 0.63 MG/3ML IN NEBU
0.6300 mg | INHALATION_SOLUTION | RESPIRATORY_TRACT | Status: DC
  Administered 2017-01-05 – 2017-01-07 (×8): 0.63 mg via RESPIRATORY_TRACT
  Filled 2017-01-05 (×9): qty 3

## 2017-01-05 MED ORDER — OSELTAMIVIR PHOSPHATE 75 MG PO CAPS: 75.0000 mg | ORAL_CAPSULE | Freq: Two times a day (BID) | ORAL | Status: DC

## 2017-01-05 MED ORDER — DILTIAZEM LOAD VIA INFUSION
10.0000 mg | Freq: Once | INTRAVENOUS | Status: DC
  Administered 2017-01-05: 10 mg via INTRAVENOUS
  Filled 2017-01-05: qty 10

## 2017-01-05 MED ORDER — LEVALBUTEROL HCL 0.63 MG/3ML IN NEBU
0.6300 mg | INHALATION_SOLUTION | RESPIRATORY_TRACT | Status: DC
  Administered 2017-01-05: 0.63 mg via RESPIRATORY_TRACT
  Filled 2017-01-05: qty 3

## 2017-01-05 MED ORDER — METHYLPREDNISOLONE SODIUM SUCC 125 MG IJ SOLR
60.0000 mg | Freq: Four times a day (QID) | INTRAMUSCULAR | Status: DC
  Administered 2017-01-05 – 2017-01-17 (×48): 60 mg via INTRAVENOUS
  Filled 2017-01-05 (×48): qty 2

## 2017-01-05 MED ORDER — IPRATROPIUM BROMIDE 0.02 % IN SOLN
0.5000 mg | RESPIRATORY_TRACT | Status: DC
  Administered 2017-01-05 – 2017-01-07 (×9): 0.5 mg via RESPIRATORY_TRACT
  Filled 2017-01-05 (×10): qty 2.5

## 2017-01-05 MED ORDER — POLYVINYL ALCOHOL 1.4 % OP SOLN
1.0000 [drp] | OPHTHALMIC | Status: DC | PRN
  Administered 2017-01-05 – 2017-01-16 (×4): 1 [drp] via OPHTHALMIC
  Filled 2017-01-05: qty 15

## 2017-01-05 MED ORDER — POLYSACCHARIDE IRON COMPLEX 150 MG PO CAPS
150.0000 mg | ORAL_CAPSULE | Freq: Every day | ORAL | Status: DC
  Administered 2017-01-05 – 2017-01-18 (×14): 150 mg via ORAL
  Filled 2017-01-05 (×14): qty 1

## 2017-01-05 MED ORDER — ONDANSETRON HCL 4 MG/2ML IJ SOLN: 4.0000 mg | Freq: Four times a day (QID) | INTRAMUSCULAR | Status: DC | PRN

## 2017-01-05 MED ORDER — LEVALBUTEROL HCL 0.63 MG/3ML IN NEBU
0.6300 mg | INHALATION_SOLUTION | RESPIRATORY_TRACT | Status: DC | PRN
  Administered 2017-01-10: 0.63 mg via RESPIRATORY_TRACT
  Filled 2017-01-05: qty 3

## 2017-01-05 MED ORDER — ONDANSETRON HCL 4 MG PO TABS: 4.0000 mg | ORAL_TABLET | Freq: Four times a day (QID) | ORAL | Status: DC | PRN

## 2017-01-05 MED ORDER — GUAIFENESIN ER 600 MG PO TB12
1200.0000 mg | ORAL_TABLET | Freq: Two times a day (BID) | ORAL | Status: DC
  Administered 2017-01-05 – 2017-01-18 (×26): 1200 mg via ORAL
  Filled 2017-01-05 (×26): qty 2

## 2017-01-05 MED ORDER — ASPIRIN 81 MG PO CHEW
324.0000 mg | CHEWABLE_TABLET | Freq: Once | ORAL | Status: AC
  Administered 2017-01-05: 324 mg via ORAL
  Filled 2017-01-05: qty 4

## 2017-01-05 MED ORDER — OMEGA-3-ACID ETHYL ESTERS 1 G PO CAPS
2.0000 g | ORAL_CAPSULE | Freq: Two times a day (BID) | ORAL | Status: DC
  Administered 2017-01-05 – 2017-01-18 (×25): 2 g via ORAL
  Filled 2017-01-05 (×25): qty 2

## 2017-01-05 MED ORDER — DILTIAZEM HCL ER COATED BEADS 300 MG PO CP24
300.0000 mg | ORAL_CAPSULE | Freq: Every day | ORAL | Status: DC
  Administered 2017-01-05: 300 mg via ORAL
  Filled 2017-01-05 (×4): qty 1

## 2017-01-05 MED ORDER — ALBUTEROL (5 MG/ML) CONTINUOUS INHALATION SOLN
10.0000 mg/h | INHALATION_SOLUTION | RESPIRATORY_TRACT | Status: AC
  Administered 2017-01-05: 10 mg/h via RESPIRATORY_TRACT
  Filled 2017-01-05: qty 20

## 2017-01-05 MED ORDER — ATORVASTATIN CALCIUM 20 MG PO TABS
20.0000 mg | ORAL_TABLET | Freq: Every day | ORAL | Status: DC
  Administered 2017-01-05 – 2017-01-18 (×14): 20 mg via ORAL
  Filled 2017-01-05 (×14): qty 1

## 2017-01-05 MED ORDER — DULOXETINE HCL 60 MG PO CPEP
60.0000 mg | ORAL_CAPSULE | Freq: Every day | ORAL | Status: DC
  Administered 2017-01-05 – 2017-01-18 (×14): 60 mg via ORAL
  Filled 2017-01-05 (×14): qty 1

## 2017-01-05 MED ORDER — OSELTAMIVIR PHOSPHATE 30 MG PO CAPS
30.0000 mg | ORAL_CAPSULE | Freq: Every day | ORAL | Status: AC
  Administered 2017-01-05 – 2017-01-09 (×5): 30 mg via ORAL
  Filled 2017-01-05 (×5): qty 1

## 2017-01-05 MED ORDER — ACETAMINOPHEN 325 MG PO TABS
650.0000 mg | ORAL_TABLET | Freq: Four times a day (QID) | ORAL | Status: DC | PRN
  Administered 2017-01-05 – 2017-01-18 (×5): 650 mg via ORAL
  Filled 2017-01-05 (×5): qty 2

## 2017-01-05 MED ORDER — WARFARIN - PHARMACIST DOSING INPATIENT
Status: DC
  Administered 2017-01-05 – 2017-01-13 (×7)
  Administered 2017-01-14: 1
  Administered 2017-01-18: 16:00:00

## 2017-01-05 MED ORDER — LEVOFLOXACIN 750 MG PO TABS
750.0000 mg | ORAL_TABLET | ORAL | Status: DC
  Administered 2017-01-05 – 2017-01-07 (×2): 750 mg via ORAL
  Filled 2017-01-05 (×2): qty 1

## 2017-01-05 MED ORDER — ACETAMINOPHEN 650 MG RE SUPP: 650.0000 mg | Freq: Four times a day (QID) | RECTAL | Status: DC | PRN

## 2017-01-05 NOTE — ED Triage Notes (Signed)
Pt brought in by EMS for shortness for 4 days. O2 sats 91% on room air on EMS arrival. Pt reports cough x 3 days with occcassional productive cough. Reports pain in back, weakness and loss of appetite

## 2017-01-05 NOTE — Plan of Care (Signed)
Problem: Education: Goal: Knowledge of [facility] Education information/materials will improve Outcome: Progressing Patient given new patient handbook and verbalizes understanding of procedures here at [facility].

## 2017-01-05 NOTE — Progress Notes (Signed)
ANTICOAGULATION CONSULT NOTE - Initial Consult  Pharmacy Consult for Coumadin (chronic Rx PTA) Indication: atrial fibrillation  Allergies  Allergen Reactions  . Wellbutrin [Bupropion] Anxiety    Patient Measurements: Height: '5\' 7"'$  (170.2 cm) Weight: 170 lb (77.1 kg) IBW/kg (Calculated) : 66.1  Vital Signs: BP: 128/67 (02/23 1200) Pulse Rate: 132 (02/23 1100)  Labs:  Recent Labs  01/05/17 0846  HGB 14.0  HCT 40.9  PLT 152  LABPROT 22.8*  INR 1.98  CREATININE 3.07*  TROPONINI 0.03*    Estimated Creatinine Clearance: 18.8 mL/min (by C-G formula based on SCr of 3.07 mg/dL (H)).   Medical History: Past Medical History:  Diagnosis Date  . Acute bronchitis 04/03/2014  . Anxiety   . Atrial fibrillation with RVR ([place]) 04/03/2014  . Cataract   . COPD (chronic obstructive pulmonary disease) ([place])   . Hyperlipidemia   . Hypertension   . Macular degeneration   . Noncompliance with medications 04/2013   xarelto, digoxin  . Pre-diabetes   . Prostate cancer ([place])   . Stage III chronic kidney disease 04/03/2014  . Tobacco abuse    ongoing  . Tubular adenoma of colon 07/31/02, 11/18/03    Medications:   (Not in a hospital admission)  Assessment: 76yo male on chronic Coumadin PTA.  INR slightly below goal today on admission.  Pt on chronic Coumadin for afib.  Goal of Therapy:  INR 2-3 Monitor platelets by anticoagulation protocol: Yes   Plan:  Coumadin '6mg'$  today x 1 (home dose) INR daily  [place] Crane, Albena Comes A 01/05/2017,12:39 PM

## 2017-01-05 NOTE — ED Provider Notes (Signed)
[facility] DEPT Provider Note   CSN: [id number] Arrival date & time: 01/05/17  6834  By signing my name below, I, Jaquelyn Bitter., attest that this documentation has been prepared under the direction and in the presence of Noemi Chapel, MD. Electronically signed: Jaquelyn Bitter., ED Scribe. 01/05/17. 10:50 AM.   History   Chief Complaint Chief Complaint  Patient presents with  . Shortness of Breath    HPI  Don [name] is a 76 y.o. male with hx of MI (x3 years ago) who presents to the Emergency Department complaining of constant,  moderate SOB with sudden onset x4 days. Per EMS personnel, pt has been SOB for the past x4 days. O2 saturation 91% on RA upon arrival. Pt also complains of back pain which he rates 9/10. He reports SOB, intermittent fever, weakness, decreased appetite and productive cough. He denies leg swelling, hx of blood clot, O2 use at home. Of note, pt is on coumadin and complains of hx of irregular heart rate. Pt was recently placed on doxycycline for a finger infection. He also has a prescription of doxycycline from November that did not get used. Pt states that he uses Spiriva and Symbicort but not albuterol inhalers. Pt is a smoker.  The history is provided by the patient and the EMS personnel. No language interpreter was used.    Past Medical History:  Diagnosis Date  . Acute bronchitis 04/03/2014  . Anxiety   . Atrial fibrillation with RVR ([place]) 04/03/2014  . Cataract   . COPD (chronic obstructive pulmonary disease) ([place])   . Hyperlipidemia   . Hypertension   . Macular degeneration   . Noncompliance with medications 04/2013   xarelto, digoxin  . Pre-diabetes   . Prostate cancer ([place])   . Stage III chronic kidney disease 04/03/2014  . Tobacco abuse    ongoing  . Tubular adenoma of colon 07/31/02, 11/18/03    Patient Active Problem List   Diagnosis Date Noted  . Pre-diabetes 07/23/2015  . Metabolic syndrome 19/62/2297  . Urinary  bladder incontinence 03/04/2015  . Paroxysmal atrial fibrillation ([place]) 05/25/2014  . Hiatal hernia 04/06/2014  . Other and unspecified hyperlipidemia 04/04/2014  . Personal history of colonic polyps 04/04/2014  . History of prostate cancer 04/03/2014  . Stage III chronic kidney disease 04/03/2014  . Tenosynovitis of thumb 10/16/2013  . Atrial fibrillation ([place]) 05/19/2013  . Tobacco abuse 04/25/2013  . Hernia 02/08/2011  . Prostate cancer ([place]) 02/08/2011  . COPD (chronic obstructive pulmonary disease) ([place]) 02/08/2011  . Hypertension 02/08/2011    Past Surgical History:  Procedure Laterality Date  . Anal abcess,Hemorroids,    . COLONOSCOPY N/A 04/05/2014   Procedure: COLONOSCOPY;  Surgeon: Ladene Artist, MD;  Location: [facility] ENDOSCOPY;  Service: Endoscopy;  Laterality: N/A;  . COLONOSCOPY W/ BIOPSIES  11/18/2003   Dr. Earle Gell  . ESOPHAGOGASTRODUODENOSCOPY  11/18/2003   Dr. Earle Gell  . ESOPHAGOGASTRODUODENOSCOPY N/A 04/05/2014   Procedure: ESOPHAGOGASTRODUODENOSCOPY (EGD);  Surgeon: Ladene Artist, MD;  Location: [facility];  Service: Endoscopy;  Laterality: N/A;  . RETROPUBIC PROSTATECTOMY  11/26/2001  . TONSILLECTOMY         Home Medications    Prior to Admission medications   Medication Sig Start Date End Date Taking? Authorizing Provider  albuterol (PROVENTIL HFA;VENTOLIN HFA) 108 (90 BASE) MCG/ACT inhaler Inhale 2 puffs into the lungs every 6 (six) hours as needed for wheezing or shortness of breath. 04/16/14   Lysbeth Penner, FNP  ALLERGY RELIEF 10 MG tablet TAKE 1 TABLET DAILY FOR ALLERGY 01/04/17   Timmothy Euler, MD  atorvastatin (LIPITOR) 20 MG tablet TAKE 1 TABLET DAILY 12/15/16   Wardell Honour, MD  budesonide-formoterol [facility]) 160-4.5 MCG/ACT inhaler Inhale 2 puffs into the lungs 2 (two) times daily. 02/10/16   Wardell Honour, MD  clonazePAM (KLONOPIN) 1 MG tablet TAKE 1/2 TABLET ONCE DAILY AS NEEDED FOR ANXIETY 12/15/16   Wardell Honour,  MD  diltiazem (CARDIZEM CD) 300 MG 24 hr capsule TAKE (1) CAPSULE DAILY 12/15/16   Wardell Honour, MD  doxycycline (ADOXA) 100 MG tablet Take 1 tablet (100 mg total) by mouth 2 (two) times daily. 12/29/16   Fransisca Kaufmann Dettinger, MD  doxylamine, Sleep, (UNISOM) 25 MG tablet Take 25 mg by mouth at bedtime as needed.    Historical Provider, MD  DULoxetine (CYMBALTA) 60 MG capsule TAKE (1) CAPSULE DAILY 11/20/16   Terald Sleeper, PA-C  FERREX 150 150 MG capsule Take 1 capsule by mouth daily. 07/08/15   Historical Provider, MD  furosemide (LASIX) 40 MG tablet Take 1 tablet by mouth every other day.  07/19/16   Historical Provider, MD  Incontinence Supply Disposable (INCONTINENCE BRIEF LARGE) MISC Use as needed for urinary incontinence.  Dx: urinary incontinence R32 and prostate cancer C61 09/19/16   Wardell Honour, MD  metoprolol tartrate (LOPRESSOR) 25 MG tablet TAKE (1) TABLET TWICE A DAY. 08/31/16   Wardell Honour, MD  Multiple Vitamin (MULTIVITAMIN WITH MINERALS) TABS Take 1 tablet by mouth daily.    Historical Provider, MD  nitroGLYCERIN (NITROSTAT) 0.4 MG SL tablet Place 1 tablet (0.4 mg total) under the tongue every 5 (five) minutes as needed for chest pain. 02/10/16   Wardell Honour, MD  omega-3 acid ethyl esters (LOVAZA) 1 G capsule TAKE (2) CAPSULES TWICE DAILY. 07/28/15   Wardell Honour, MD  pantoprazole (PROTONIX) 40 MG tablet TAKE 1 TABLET DAILY 12/02/16   Chipper Herb, MD  Polyethyl Glycol-Propyl Glycol (SYSTANE OP) Place 1 drop into both eyes 2 (two) times daily as needed (dry eyes).     Historical Provider, MD  Probiotic Product (PRO-BIOTIC BLEND) CAPS Take 1 capsule by mouth daily. 07/23/15   Cherre Robins, PharmD  terazosin (HYTRIN) 2 MG capsule TAKE 1 CAPSULE AT BEDTIME 09/20/16   Wardell Honour, MD  tiotropium (SPIRIVA) 18 MCG inhalation capsule Place 1 capsule (18 mcg total) into inhaler and inhale daily. 09/09/15   Fransisca Kaufmann Dettinger, MD  triamcinolone cream (KENALOG) 0.1 % Apply 1  application topically 2 (two) times daily. 11/10/16   Cherre Robins, PharmD  warfarin (COUMADIN) 4 MG tablet Take 1 and 1/2 tablets (= '6mg'$ ) daily except on Sundays and Thursdays take 1 tablet (='4mg'$ ) 01/04/17   Wardell Honour, MD    Family History Family History  Problem Relation Age of Onset  . Diabetes Father   . Heart disease Father     MI  . Heart attack Father   . Heart attack Mother   . Heart disease Brother   . Cancer Brother     lung  . Lung cancer Brother   . Heart disease Brother   . Lung cancer Sister   . Cancer Sister     lung  . Heart disease Brother   . Heart disease Brother     Social History Social History  Substance Use Topics  . Smoking status: Current Every Day Smoker    Packs/day: 1.00  Years: 62.00    Types: Cigarettes, Cigars  . Smokeless tobacco: Never Used  . Alcohol use No     Allergies   Wellbutrin [bupropion]   Review of Systems Review of Systems  Constitutional: Positive for appetite change and fever.  Respiratory: Positive for cough and shortness of breath.   Cardiovascular: Negative for leg swelling.  Musculoskeletal: Positive for back pain.  Neurological: Positive for weakness.  All other systems reviewed and are negative.    Physical Exam Updated Vital Signs BP 157/90   Pulse (!) 152   Resp (!) 30   Ht '5\' 7"'$  (1.702 m)   Wt 170 lb (77.1 kg)   SpO2 95%   BMI 26.63 kg/m   Physical Exam  Constitutional: He appears well-developed and well-nourished. No distress.  HENT:  Head: Normocephalic and atraumatic.  Mouth/Throat: Oropharynx is clear and moist. No oropharyngeal exudate.  Eyes: Conjunctivae and EOM are normal. Pupils are equal, round, and reactive to light. Right eye exhibits no discharge. Left eye exhibits no discharge. No scleral icterus.  Neck: Normal range of motion. Neck supple. No JVD present. No thyromegaly present.  Cardiovascular: Normal rate, regular rhythm, normal heart sounds and intact distal pulses.   Exam reveals no gallop and no friction rub.   No murmur heard. Strong pulses,  a-fib, RVR  Pulmonary/Chest: Effort normal. Tachypnea noted. No respiratory distress. He has wheezes. He has no rales.  Diffuse expiratory wheezes. Increased work of breathing and accessory muscle use.   Abdominal: Soft. Bowel sounds are normal. He exhibits no distension and no mass. There is no tenderness.  Musculoskeletal: Normal range of motion. He exhibits no edema or tenderness.  Lymphadenopathy:    He has no cervical adenopathy.  Neurological: He is alert. Coordination normal.  Skin: Skin is warm and dry. No rash noted. No erythema.  Psychiatric: He has a normal mood and affect. His behavior is normal.  Nursing note and vitals reviewed.    ED Treatments / Results   DIAGNOSTIC STUDIES: Oxygen Saturation is 91% on RA, inadequate by my interpretation.   COORDINATION OF CARE: 10:50 AM-Discussed next steps with pt. Pt verbalized understanding and is agreeable with the plan.    Labs (all labs ordered are listed, but only abnormal results are displayed) Labs Reviewed  CBC WITH DIFFERENTIAL/PLATELET - Abnormal; Notable for the following:       Result Value   RBC 4.13 (*)    Lymphs Abs 0.5 (*)    All other components within normal limits  COMPREHENSIVE METABOLIC PANEL - Abnormal; Notable for the following:    Chloride 98 (*)    Glucose, Bld 110 (*)    BUN 48 (*)    Creatinine, Ser 3.07 (*)    Total Protein 6.3 (*)    Albumin 3.2 (*)    AST 47 (*)    GFR calc non Af Amer 18 (*)    GFR calc Af Amer 21 (*)    All other components within normal limits  TROPONIN I - Abnormal; Notable for the following:    Troponin I 0.03 (*)    All other components within normal limits  BRAIN NATRIURETIC PEPTIDE - Abnormal; Notable for the following:    B Natriuretic Peptide 341.0 (*)    All other components within normal limits  PROTIME-INR - Abnormal; Notable for the following:    Prothrombin Time 22.8 (*)      All other components within normal limits    EKG  EKG Interpretation  Date/Time:  Friday January 05 2017 08:38:46 EST Ventricular Rate:  117 PR Interval:    QRS Duration: 96 QT Interval:  293 QTC Calculation: 409 R Axis:   85 Text Interpretation:  Atrial fibrillation Ventricular premature complex Borderline right axis deviation Anteroseptal infarct, old Nonspecific repol abnormality, diffuse leads Since last tracing rate faster Confirmed by Jaystin Mcgarvey  MD, Donnamaria Shands ([id number]) on 01/05/2017 8:45:43 AM       Radiology Dg Chest Port 1 View  Result Date: 01/05/2017 CLINICAL DATA:  Short of breath 4 days EXAM: PORTABLE CHEST 1 VIEW COMPARISON:  Radiograph 2126 FINDINGS: Normal cardiac silhouette. No effusion, infiltrate, or pneumothorax. Patient rotated rightward. No acute osseous abnormality. IMPRESSION: No acute cardiopulmonary process. Electronically Signed   By: Suzy Bouchard M.D.   On: 01/05/2017 09:58    Procedures Procedures (including critical care time)  Medications Ordered in ED Medications  albuterol (PROVENTIL,VENTOLIN) solution continuous neb (10 mg/hr Nebulization New Bag/Given 01/05/17 0927)  diltiazem (CARDIZEM CD) 24 hr capsule 300 mg (300 mg Oral Given 01/05/17 1047)  ipratropium (ATROVENT) nebulizer solution 0.5 mg (0.5 mg Nebulization Given 01/05/17 0927)  methylPREDNISolone sodium succinate (SOLU-MEDROL) 125 mg/2 mL injection 125 mg (125 mg Intravenous Given 01/05/17 0909)  aspirin chewable tablet 324 mg (324 mg Oral Given 01/05/17 1020)     Initial Impression / Assessment and Plan / ED Course  I have reviewed the triage vital signs and the nursing notes.  Pertinent labs & imaging results that were available during my care of the patient were reviewed by me and considered in my medical decision making (see chart for details).      The patient does appear to have increased work of breathing and respiratory distress with diffuse wheezing. Improved some with nebulizer  treatments. X-ray without infiltrates or edema, troponin slightly elevated, BNP slightly elevated, creatinine also slightly elevated. He does suspect to have an acute kidney injury. I discussed his care with the hospitalist who will admit the patient to hospital.  Final Clinical Impressions(s) / ED Diagnoses   Final diagnoses:  Respiratory distress  COPD exacerbation (HCC)  Elevated troponin    New Prescriptions New Prescriptions   No medications on file   I personally performed the services described in this documentation, which was scribed in my presence. The recorded information has been reviewed and is accurate.       Noemi Chapel, MD 01/05/17 1051

## 2017-01-05 NOTE — H&P (Signed)
History and Physical    Don [name] ZTI:[id number] DOB: 1941-01-06 DOA: 01/05/2017  PCP: Wardell Honour, MD  Patient coming from: home  Chief Complaint: shortness of breath  HPI: Don [name] is a 76 y.o. male with medical history significant of COPD and tobacco use, presents to the emergency room with complaints of shortness of breath. Patient reports that his symptoms have been present and progressive for the past week. He may been feverish, but is unsure. He does have a productive cough. He describes soreness in his abdomen and ribs due to persistent coughing. He's also had worsening wheezing and has been using his nebulizers at home without significant benefit. He does not wear oxygen at home. He's been taking all his medications. His by mouth intake has been poor over the last several days. He felt dizzy and lightheaded, specifically on standing. He felt generally weak..  ED Course: On arrival to the emergency room, patient was noted to be in respiratory distress and was placed on supplemental oxygen. Chest x-ray did not show any infiltrate. BNP was mildly elevated at 341. Troponin mildly elevated at 0.03. EKG showed rapid atrial fibrillation. Creatinine elevated at 3.07 with a baseline creatinine of approximately 2. Heart rate has remained in the 130s to 140s in atrial fibrillation. Blood pressure has been stable. He's been referred for admission.  Review of Systems: As per HPI otherwise 10 point review of systems negative.    Past Medical History:  Diagnosis Date  . Acute bronchitis 04/03/2014  . Anxiety   . Atrial fibrillation with RVR ([place]) 04/03/2014  . Cataract   . COPD (chronic obstructive pulmonary disease) ([place])   . Hyperlipidemia   . Hypertension   . Macular degeneration   . Noncompliance with medications 04/2013   xarelto, digoxin  . Pre-diabetes   . Prostate cancer ([place])   . Stage III chronic kidney disease 04/03/2014  . Tobacco abuse    ongoing  . Tubular  adenoma of colon 07/31/02, 11/18/03    Past Surgical History:  Procedure Laterality Date  . Anal abcess,Hemorroids,    . COLONOSCOPY N/A 04/05/2014   Procedure: COLONOSCOPY;  Surgeon: Ladene Artist, MD;  Location: [facility];  Service: Endoscopy;  Laterality: N/A;  . COLONOSCOPY W/ BIOPSIES  11/18/2003   Dr. Earle Gell  . ESOPHAGOGASTRODUODENOSCOPY  11/18/2003   Dr. Earle Gell  . ESOPHAGOGASTRODUODENOSCOPY N/A 04/05/2014   Procedure: ESOPHAGOGASTRODUODENOSCOPY (EGD);  Surgeon: Ladene Artist, MD;  Location: [facility];  Service: Endoscopy;  Laterality: N/A;  . RETROPUBIC PROSTATECTOMY  11/26/2001  . TONSILLECTOMY       reports that he has been smoking Cigarettes and Cigars.  He has a 62.00 pack-year smoking history. He has never used smokeless tobacco. He reports that he does not drink alcohol or use drugs.  Allergies  Allergen Reactions  . Wellbutrin [Bupropion] Anxiety    Family History  Problem Relation Age of Onset  . Diabetes Father   . Heart disease Father     MI  . Heart attack Father   . Heart attack Mother   . Heart disease Brother   . Cancer Brother     lung  . Lung cancer Brother   . Heart disease Brother   . Lung cancer Sister   . Cancer Sister     lung  . Heart disease Brother   . Heart disease Brother     Prior to Admission medications   Medication Sig Start Date End Date Taking? Authorizing  Provider  albuterol (PROVENTIL HFA;VENTOLIN HFA) 108 (90 BASE) MCG/ACT inhaler Inhale 2 puffs into the lungs every 6 (six) hours as needed for wheezing or shortness of breath. 04/16/14   Lysbeth Penner, FNP  ALLERGY RELIEF 10 MG tablet TAKE 1 TABLET DAILY FOR ALLERGY 01/04/17   Timmothy Euler, MD  atorvastatin (LIPITOR) 20 MG tablet TAKE 1 TABLET DAILY 12/15/16   Wardell Honour, MD  budesonide-formoterol [facility]) 160-4.5 MCG/ACT inhaler Inhale 2 puffs into the lungs 2 (two) times daily. 02/10/16   Wardell Honour, MD  clonazePAM (KLONOPIN) 1 MG tablet  TAKE 1/2 TABLET ONCE DAILY AS NEEDED FOR ANXIETY 12/15/16   Wardell Honour, MD  diltiazem (CARDIZEM CD) 300 MG 24 hr capsule TAKE (1) CAPSULE DAILY 12/15/16   Wardell Honour, MD  doxycycline (ADOXA) 100 MG tablet Take 1 tablet (100 mg total) by mouth 2 (two) times daily. 12/29/16   Fransisca Kaufmann Dettinger, MD  doxylamine, Sleep, (UNISOM) 25 MG tablet Take 25 mg by mouth at bedtime as needed.    Historical Provider, MD  DULoxetine (CYMBALTA) 60 MG capsule TAKE (1) CAPSULE DAILY 11/20/16   Terald Sleeper, PA-C  FERREX 150 150 MG capsule Take 1 capsule by mouth daily. 07/08/15   Historical Provider, MD  furosemide (LASIX) 40 MG tablet Take 1 tablet by mouth every other day.  07/19/16   Historical Provider, MD  Incontinence Supply Disposable (INCONTINENCE BRIEF LARGE) MISC Use as needed for urinary incontinence.  Dx: urinary incontinence R32 and prostate cancer C61 09/19/16   Wardell Honour, MD  metoprolol tartrate (LOPRESSOR) 25 MG tablet TAKE (1) TABLET TWICE A DAY. 08/31/16   Wardell Honour, MD  Multiple Vitamin (MULTIVITAMIN WITH MINERALS) TABS Take 1 tablet by mouth daily.    Historical Provider, MD  nitroGLYCERIN (NITROSTAT) 0.4 MG SL tablet Place 1 tablet (0.4 mg total) under the tongue every 5 (five) minutes as needed for chest pain. 02/10/16   Wardell Honour, MD  omega-3 acid ethyl esters (LOVAZA) 1 G capsule TAKE (2) CAPSULES TWICE DAILY. 07/28/15   Wardell Honour, MD  pantoprazole (PROTONIX) 40 MG tablet TAKE 1 TABLET DAILY 12/02/16   Chipper Herb, MD  Polyethyl Glycol-Propyl Glycol (SYSTANE OP) Place 1 drop into both eyes 2 (two) times daily as needed (dry eyes).     Historical Provider, MD  Probiotic Product (PRO-BIOTIC BLEND) CAPS Take 1 capsule by mouth daily. 07/23/15   Cherre Robins, PharmD  terazosin (HYTRIN) 2 MG capsule TAKE 1 CAPSULE AT BEDTIME 09/20/16   Wardell Honour, MD  tiotropium (SPIRIVA) 18 MCG inhalation capsule Place 1 capsule (18 mcg total) into inhaler and inhale daily. 09/09/15    Fransisca Kaufmann Dettinger, MD  triamcinolone cream (KENALOG) 0.1 % Apply 1 application topically 2 (two) times daily. 11/10/16   Cherre Robins, PharmD  warfarin (COUMADIN) 4 MG tablet Take 1 and 1/2 tablets (= '6mg'$ ) daily except on Sundays and Thursdays take 1 tablet (='4mg'$ ) 01/04/17   Wardell Honour, MD    Physical Exam: Vitals:   01/05/17 1030 01/05/17 1045 01/05/17 1100 01/05/17 1130  BP: 157/90  143/58 130/74  Pulse: (!) 145 (!) 152 (!) 132   Resp: (!) 32 (!) 30 (!) 30 (!) 27  SpO2: 94% 95% 95%   Weight:      Height:          Constitutional: NAD, calm, comfortable Vitals:   01/05/17 1030 01/05/17 1045 01/05/17 1100 01/05/17 1130  BP:  157/90  143/58 130/74  Pulse: (!) 145 (!) 152 (!) 132   Resp: (!) 32 (!) 30 (!) 30 (!) 27  SpO2: 94% 95% 95%   Weight:      Height:       Eyes: PERRL, lids and conjunctivae normal ENMT: Mucous membranes are dry. Posterior pharynx clear of any exudate or lesions.Normal dentition.  Neck: normal, supple, no masses, no thyromegaly Respiratory: bilateral wheezes and rhonchi. Normal respiratory effort. No accessory muscle use.  Cardiovascular: irregular, no murmurs / rubs / gallops. 1-2+ extremity edema. 2+ pedal pulses. No carotid bruits.  Abdomen: no tenderness, no masses palpated. No hepatosplenomegaly. Bowel sounds positive.  Musculoskeletal: no clubbing / cyanosis. No joint deformity upper and lower extremities. Good ROM, no contractures. Normal muscle tone.  Skin: no rashes, lesions, ulcers. No induration Neurologic: CN 2-12 grossly intact. Sensation intact, DTR normal. Strength 5/5 in all 4.  Psychiatric: Normal judgment and insight. Alert and oriented x 3. Normal mood.   Labs on Admission: I have personally reviewed following labs and imaging studies  CBC:  Recent Labs Lab 01/05/17 0846  WBC 5.9  NEUTROABS 4.8  HGB 14.0  HCT 40.9  MCV 99.0  PLT 366   Basic Metabolic Panel:  Recent Labs Lab 01/05/17 0846  NA 135  K 4.8  CL 98*    CO2 28  GLUCOSE 110*  BUN 48*  CREATININE 3.07*  CALCIUM 8.9   GFR: Estimated Creatinine Clearance: 18.8 mL/min (by C-G formula based on SCr of 3.07 mg/dL (H)). Liver Function Tests:  Recent Labs Lab 01/05/17 0846  AST 47*  ALT 18  ALKPHOS 56  BILITOT 1.0  PROT 6.3*  ALBUMIN 3.2*   No results for input(s): LIPASE, AMYLASE in the last 168 hours. No results for input(s): AMMONIA in the last 168 hours. Coagulation Profile:  Recent Labs Lab 01/05/17 0846  INR 1.98   Cardiac Enzymes:  Recent Labs Lab 01/05/17 0846  TROPONINI 0.03*   BNP (last 3 results) No results for input(s): PROBNP in the last 8760 hours. HbA1C: No results for input(s): HGBA1C in the last 72 hours. CBG: No results for input(s): GLUCAP in the last 168 hours. Lipid Profile: No results for input(s): CHOL, HDL, LDLCALC, TRIG, CHOLHDL, LDLDIRECT in the last 72 hours. Thyroid Function Tests: No results for input(s): TSH, T4TOTAL, FREET4, T3FREE, THYROIDAB in the last 72 hours. Anemia Panel: No results for input(s): VITAMINB12, FOLATE, FERRITIN, TIBC, IRON, RETICCTPCT in the last 72 hours. Urine analysis:    Component Value Date/Time   COLORURINE YELLOW 04/03/2014 2308   APPEARANCEUR CLEAR 04/03/2014 2308   LABSPEC 1.025 04/03/2014 2308   PHURINE 5.5 04/03/2014 2308   GLUCOSEU NEGATIVE 04/03/2014 2308   HGBUR MODERATE (A) 04/03/2014 2308   BILIRUBINUR neg 10/07/2014 1122   KETONESUR NEGATIVE 04/03/2014 2308   PROTEINUR 300+++ 10/07/2014 1122   PROTEINUR 100 (A) 04/03/2014 2308   UROBILINOGEN negative 10/07/2014 1122   UROBILINOGEN 0.2 04/03/2014 2308   NITRITE neg 10/07/2014 1122   NITRITE NEGATIVE 04/03/2014 2308   LEUKOCYTESUR Negative 10/07/2014 1122    Radiological Exams on Admission: Dg Chest Port 1 View  Result Date: 01/05/2017 CLINICAL DATA:  Short of breath 4 days EXAM: PORTABLE CHEST 1 VIEW COMPARISON:  Radiograph 2126 FINDINGS: Normal cardiac silhouette. No effusion,  infiltrate, or pneumothorax. Patient rotated rightward. No acute osseous abnormality. IMPRESSION: No acute cardiopulmonary process. Electronically Signed   By: Suzy Bouchard M.D.   On: 01/05/2017 09:58    EKG: Independently  reviewed. Rapid atrial fibrillation  Assessment/Plan Active Problems:   Hypertension   Tobacco abuse   Stage III chronic kidney disease   COPD exacerbation (HCC)   Atrial fibrillation with RVR (HCC)   Acute respiratory failure with hypoxia (HCC)   AKI (acute kidney injury) ([place])     1. Acute respiratory failure. Related to COPD exacerbation. We'll start to wean off oxygen as his clinical condition improves.  2. COPD exacerbation. Continue the patient on IV steroids, antibiotics and bronchodilators. Continue pulmonary hygiene.  3. Rapid atrial fibrillation. Likely precipitated by underlying respiratory issues. He does take metoprolol at home, which will be held due to significant bronchospasms. He also takes diltiazem. We'll start the patient on intravenous diltiazem in order to achieve better control. This can be transitioned back to oral tablet once heart rate is better controlled. He is anticoagulated with Coumadin. Check echocardiogram and TSH.  4. AKI on CKD3. Creatinine is currently at 3. He has had decreased by mouth intake for several days. Mucous membranes are dry. He does have lower extremity edema but reports this is chronic and is at baseline. Will try gentle hydration for the next 24 hours and follow labs.  5. Hypertension. Blood pressures currently stable. Continue current treatments.  6. Tobacco use. Counseled on importance of tobacco cessation.  7. History of systolic dysfunction. Last echocardiogram from 2015 shows EF of 45%. We'll repeat .[name]   DVT prophylaxis: coumadin Code Status: partial code, no cpr, no defibrillation Family Communication: no family present Disposition Plan: pending Consults called:  Admission status: inpatient,  stepdown   MEMON,JEHANZEB MD [facility] Pager [phone]  If 7PM-7AM, please contact night-coverage [email or web address] Password [email or web address]  01/05/2017, 12:00 PM

## 2017-01-05 NOTE — ED Notes (Signed)
CRITICAL VALUE ALERT  Critical value received:  Troponin 0.03  Date of notification:  01/05/17  Time of notification:  0929  Critical value read back:Yes.    Nurse who received alert:  Charmayne Sheer, RN  MD notified (1st page):  Sabra Heck

## 2017-01-05 NOTE — ED Notes (Signed)
O2 sat decreased 90 % on RA

## 2017-01-05 NOTE — ED Notes (Signed)
Dr.Memon at bedside. 

## 2017-01-05 NOTE — Progress Notes (Signed)
*  PRELIMINARY RESULTS* Echocardiogram 2D Echocardiogram has been performed.  Don [name] 01/05/2017, 4:08 PM

## 2017-01-06 LAB — BASIC METABOLIC PANEL
ANION GAP: 10 (ref 5–15)
BUN: 56 mg/dL — AB (ref 6–20)
CHLORIDE: 102 mmol/L (ref 101–111)
CO2: 24 mmol/L (ref 22–32)
Calcium: 8.6 mg/dL — ABNORMAL LOW (ref 8.9–10.3)
Creatinine, Ser: 3.16 mg/dL — ABNORMAL HIGH (ref 0.61–1.24)
GFR calc Af Amer: 20 mL/min — ABNORMAL LOW (ref >60)
GFR, EST NON AFRICAN AMERICAN: 18 mL/min — AB (ref >60)
GLUCOSE: 187 mg/dL — AB (ref 65–99)
POTASSIUM: 4.1 mmol/L (ref 3.5–5.1)
Sodium: 136 mmol/L (ref 135–145)

## 2017-01-06 LAB — CBC
HEMATOCRIT: 33 % — AB (ref 39.0–52.0)
HEMOGLOBIN: 11.8 g/dL — AB (ref 13.0–17.0)
MCH: 34.7 pg — ABNORMAL HIGH (ref 26.0–34.0)
MCHC: 35.8 g/dL (ref 30.0–36.0)
MCV: 97.1 fL (ref 78.0–100.0)
Platelets: 150 10*3/uL (ref 150–400)
RBC: 3.4 MIL/uL — ABNORMAL LOW (ref 4.22–5.81)
RDW: 13.4 % (ref 11.5–15.5)
WBC: 9.5 10*3/uL (ref 4.0–10.5)

## 2017-01-06 LAB — TROPONIN I: Troponin I: 0.03 ng/mL (ref <0.03)

## 2017-01-06 LAB — PROTIME-INR
INR: 2.08
Prothrombin Time: 23.7 seconds — ABNORMAL HIGH (ref 11.4–15.2)

## 2017-01-06 MED ORDER — FUROSEMIDE 10 MG/ML IJ SOLN
40.0000 mg | Freq: Every day | INTRAMUSCULAR | Status: DC
  Administered 2017-01-06 – 2017-01-07 (×2): 40 mg via INTRAVENOUS
  Filled 2017-01-06 (×2): qty 4

## 2017-01-06 MED ORDER — POLYETHYLENE GLYCOL 3350 17 G PO PACK
17.0000 g | PACK | Freq: Every day | ORAL | Status: DC
  Administered 2017-01-06 – 2017-01-12 (×7): 17 g via ORAL
  Filled 2017-01-06 (×7): qty 1

## 2017-01-06 MED ORDER — WARFARIN SODIUM 5 MG PO TABS
6.0000 mg | ORAL_TABLET | Freq: Once | ORAL | Status: AC
  Administered 2017-01-06: 6 mg via ORAL
  Filled 2017-01-06: qty 1

## 2017-01-06 MED ORDER — CLONAZEPAM 0.5 MG PO TABS
0.5000 mg | ORAL_TABLET | Freq: Once | ORAL | Status: AC
  Administered 2017-01-06: 0.5 mg via ORAL
  Filled 2017-01-06: qty 1

## 2017-01-06 MED ORDER — DILTIAZEM HCL 60 MG PO TABS
90.0000 mg | ORAL_TABLET | Freq: Four times a day (QID) | ORAL | Status: DC
  Administered 2017-01-06 – 2017-01-15 (×36): 90 mg via ORAL
  Filled 2017-01-06 (×36): qty 1

## 2017-01-06 NOTE — Progress Notes (Signed)
PROGRESS NOTE    Don [name]  PQZ:[id number] DOB: [birth year] DOA: 01/05/2017 PCP: Wardell Honour, MD    Brief Narrative:  76 year old male with history of COPD and tobacco abuse, came to the emergency room with complaints of shortness of breath. Found to have COPD exacerbation and tested positive for influenza. He also had rapid atrial fibrillation requiring Cardizem infusion. Started on steroids, bronchodilators and antibiotics. Plans are to wean off Cardizem infusion. Continue pulmonary regimen.   Assessment & Plan:   Active Problems:   Hypertension   Tobacco abuse   Stage III chronic kidney disease   COPD exacerbation (HCC)   Atrial fibrillation with RVR (HCC)   Acute respiratory failure with hypoxia (HCC)   AKI (acute kidney injury) ([place])   Influenza A   1. Acute respiratory failure. Related to COPD exacerbation. We'll start to wean off oxygen as his clinical condition improves.  2. COPD exacerbation. Continue the patient on IV steroids, antibiotics and bronchodilators. Continue pulmonary hygiene.  3. Rapid atrial fibrillation. Likely precipitated by underlying respiratory issues. He does take metoprolol at home, which is being held for now due to significant bronchospasms. He also takes diltiazem. Patient is currently on on intravenous diltiazem in order to achieve better control. Will start oral diltiazem in the hopes of weaning him off the infusion. He is anticoagulated with Coumadin. TSH normal. Echocardiogram unremarkable..  4. AKI on CKD3. Creatinine is currently at 3. He has had decreased by mouth intake for several days. Mucous membranes are dry. He does have lower extremity edema but reports this is chronic and is at baseline. Started on IV fluids with no improvement in renal function. Continue to monitor. Currently started back on lasix.  5. Hypertension. Blood pressures currently stable. Continue current treatments.  6. Tobacco use. Counseled on importance  of tobacco cessation.  7. Influenza A. Started on tamiflu   DVT prophylaxis: Coumadin Code Status: Partial code, no CPR, no defibrillation Family Communication: No family present Disposition Plan: Discharge home once improved   Consultants:     Procedures:  Echo:- Left ventricle: The cavity size was normal. Systolic function was   normal. The estimated ejection fraction was in the range of 60%   to 65%. Wall motion was normal; there were no regional wall   motion abnormalities. The study was not technically sufficient to   allow evaluation of LV diastolic dysfunction due to atrial   fibrillation. Mild concentric and moderate focal basal septal   hypertrophy. - Aortic valve: Trileaflet; mildly calcified leaflets. There was no   stenosis. - Mitral valve: Moderately calcified annulus.  - Left atrium: The atrium was mildly dilated.  Antimicrobials:   Levofloxacin 2/23>>    Subjective: Still feels short of breath. Non Productive cough.  Objective: Vitals:   01/06/17 0830 01/06/17 0900 01/06/17 0910 01/06/17 0930  BP: 122/85 136/70  123/78  Pulse: (!) 106 (!) 114  (!) 112  Resp: (!) 27 20  (!) 24  Temp:      TempSrc:      SpO2: 94% 95% 95% 92%  Weight:      Height:        Intake/Output Summary (Last 24 hours) at 01/06/17 0940 Last data filed at 01/06/17 0815  Gross per 24 hour  Intake          1521.66 ml  Output              450 ml  Net  1071.66 ml   Filed Weights   01/05/17 0838 01/05/17 1347 01/06/17 0400  Weight: 77.1 kg (170 lb) 77.1 kg (169 lb 15.6 oz) 75.8 kg (167 lb 1.7 oz)    Examination:  General exam: Appears calm and comfortable  Respiratory system: bilateral rhonchi. Respiratory effort normal. Cardiovascular system: S1 & S2 heard, irregular. No JVD, murmurs, rubs, gallops or clicks. 1+ pedal edema. Gastrointestinal system: Abdomen is nondistended, soft and nontender. No organomegaly or masses felt. Normal bowel sounds heard. Central  nervous system: Alert and oriented. No focal neurological deficits. Extremities: Symmetric 5 x 5 power. Skin: No rashes, lesions or ulcers Psychiatry: Judgement and insight appear normal. Mood & affect appropriate.     Data Reviewed: I have personally reviewed following labs and imaging studies  CBC:  Recent Labs Lab 01/05/17 0846 01/06/17 0430  WBC 5.9 9.5  NEUTROABS 4.8  --   HGB 14.0 11.8*  HCT 40.9 33.0*  MCV 99.0 97.1  PLT 152 034   Basic Metabolic Panel:  Recent Labs Lab 01/05/17 0846 01/06/17 0430  NA 135 136  K 4.8 4.1  CL 98* 102  CO2 28 24  GLUCOSE 110* 187*  BUN 48* 56*  CREATININE 3.07* 3.16*  CALCIUM 8.9 8.6*   GFR: Estimated Creatinine Clearance: 19.6 mL/min (by C-G formula based on SCr of 3.16 mg/dL (H)). Liver Function Tests:  Recent Labs Lab 01/05/17 0846  AST 47*  ALT 18  ALKPHOS 56  BILITOT 1.0  PROT 6.3*  ALBUMIN 3.2*   No results for input(s): LIPASE, AMYLASE in the last 168 hours. No results for input(s): AMMONIA in the last 168 hours. Coagulation Profile:  Recent Labs Lab 01/05/17 0846 01/06/17 0430  INR 1.98 2.08   Cardiac Enzymes:  Recent Labs Lab 01/05/17 0846 01/05/17 1434 01/05/17 1933 01/06/17 0119  TROPONINI 0.03* 0.03* 0.04* 0.03*   BNP (last 3 results) No results for input(s): PROBNP in the last 8760 hours. HbA1C: No results for input(s): HGBA1C in the last 72 hours. CBG: No results for input(s): GLUCAP in the last 168 hours. Lipid Profile: No results for input(s): CHOL, HDL, LDLCALC, TRIG, CHOLHDL, LDLDIRECT in the last 72 hours. Thyroid Function Tests:  Recent Labs  01/05/17 1434  TSH 1.230   Anemia Panel: No results for input(s): VITAMINB12, FOLATE, FERRITIN, TIBC, IRON, RETICCTPCT in the last 72 hours. Sepsis Labs: No results for input(s): PROCALCITON, LATICACIDVEN in the last 168 hours.  Recent Results (from the past 240 hour(s))  MRSA PCR Screening     Status: None   Collection Time:  01/05/17  1:34 PM  Result Value Ref Range Status   MRSA by PCR NEGATIVE NEGATIVE Final    Comment:        The GeneXpert MRSA Assay (FDA approved for NASAL specimens only), is one component of a comprehensive MRSA colonization surveillance program. It is not intended to diagnose MRSA infection nor to guide or monitor treatment for MRSA infections.          Radiology Studies: Dg Chest Port 1 View  Result Date: 01/05/2017 CLINICAL DATA:  Short of breath 4 days EXAM: PORTABLE CHEST 1 VIEW COMPARISON:  Radiograph 2126 FINDINGS: Normal cardiac silhouette. No effusion, infiltrate, or pneumothorax. Patient rotated rightward. No acute osseous abnormality. IMPRESSION: No acute cardiopulmonary process. Electronically Signed   By: Suzy Bouchard M.D.   On: 01/05/2017 09:58        Scheduled Meds: . atorvastatin  20 mg Oral q1800  . diltiazem  90 mg  Oral Q6H  . DULoxetine  60 mg Oral Daily  . furosemide  40 mg Intravenous Daily  . guaiFENesin  1,200 mg Oral BID  . ipratropium  0.5 mg Nebulization Q4H  . iron polysaccharides  150 mg Oral Daily  . levalbuterol  0.63 mg Nebulization Q4H  . levofloxacin  750 mg Oral Q48H  . methylPREDNISolone (SOLU-MEDROL) injection  60 mg Intravenous Q6H  . mometasone-formoterol  2 puff Inhalation BID  . omega-3 acid ethyl esters  2 g Oral BID  . oseltamivir  30 mg Oral Q2000  . pantoprazole  40 mg Oral Daily  . polyethylene glycol  17 g Oral Daily  . terazosin  2 mg Oral QHS  . warfarin  6 mg Oral Once  . Warfarin - Pharmacist Dosing Inpatient   Does not apply Q24H   Continuous Infusions: . sodium chloride 75 mL/hr at 01/06/17 0600  . diltiazem (CARDIZEM) infusion 12.5 mg/hr (01/06/17 0850)     LOS: 1 day    Time spent: 99mns    Rosamaria Donn, MD [facility] Pager [phone] If 7PM-7AM, please contact night-coverage [email or web address] Password [email or web address]2/24/2018, 9:40 AM

## 2017-01-06 NOTE — Progress Notes (Signed)
[name] for Coumadin (chronic Rx PTA) Indication: atrial fibrillation  Allergies  Allergen Reactions  . Wellbutrin [Bupropion] Anxiety    Patient Measurements: Height: '5\' 9"'$  (175.3 cm) Weight: 167 lb 1.7 oz (75.8 kg) IBW/kg (Calculated) : 70.7  Vital Signs: Temp: 97.7 F (36.5 C) (02/24 0400) Temp Source: Oral (02/24 0400) BP: 131/70 (02/24 0600) Pulse Rate: 106 (02/24 0600)  Labs:  Recent Labs  01/05/17 0846 01/05/17 1434 01/05/17 1933 01/06/17 0119 01/06/17 0430  HGB 14.0  --   --   --  11.8*  HCT 40.9  --   --   --  33.0*  PLT 152  --   --   --  150  LABPROT 22.8*  --   --   --  23.7*  INR 1.98  --   --   --  2.08  CREATININE 3.07*  --   --   --  3.16*  TROPONINI 0.03* 0.03* 0.04* 0.03*  --     Estimated Creatinine Clearance: 19.6 mL/min (by C-G formula based on SCr of 3.16 mg/dL (H)).   Medical History: Past Medical History:  Diagnosis Date  . Acute bronchitis 04/03/2014  . Anxiety   . Atrial fibrillation with RVR ([place]) 04/03/2014  . Cataract   . COPD (chronic obstructive pulmonary disease) ([place])   . Hyperlipidemia   . Hypertension   . Macular degeneration   . Noncompliance with medications 04/2013   xarelto, digoxin  . Pre-diabetes   . Prostate cancer ([place])   . Stage III chronic kidney disease 04/03/2014  . Tobacco abuse    ongoing  . Tubular adenoma of colon 07/31/02, 11/18/03    Medications:  Prescriptions Prior to Admission  Medication Sig Dispense Refill Last Dose  . albuterol (PROVENTIL HFA;VENTOLIN HFA) 108 (90 BASE) MCG/ACT inhaler Inhale 2 puffs into the lungs every 6 (six) hours as needed for wheezing or shortness of breath. 1 Inhaler 2 Past Week at Unknown time  . ALLERGY RELIEF 10 MG tablet TAKE 1 TABLET DAILY FOR ALLERGY 30 tablet 5 Past Week at Unknown time  . atorvastatin (LIPITOR) 20 MG tablet TAKE 1 TABLET DAILY 30 tablet 2 Past Week at Unknown time  . budesonide-formoterol (SYMBICORT) 160-4.5  MCG/ACT inhaler Inhale 2 puffs into the lungs 2 (two) times daily. 1 Inhaler 3 Past Week at Unknown time  . clonazePAM (KLONOPIN) 1 MG tablet TAKE 1/2 TABLET ONCE DAILY AS NEEDED FOR ANXIETY 15 tablet 0 Past Week at Unknown time  . diltiazem (CARDIZEM CD) 300 MG 24 hr capsule TAKE (1) CAPSULE DAILY 30 capsule 1 Past Week at Unknown time  . doxycycline (ADOXA) 100 MG tablet Take 1 tablet (100 mg total) by mouth 2 (two) times daily. 20 tablet 0 Past Week at Unknown time  . doxycycline (VIBRA-TABS) 100 MG tablet Take 1 tablet by mouth 2 (two) times daily.   Past Week at Unknown time  . doxylamine, Sleep, (UNISOM) 25 MG tablet Take 25 mg by mouth at bedtime as needed.   Past Week at Unknown time  . DULoxetine (CYMBALTA) 60 MG capsule TAKE (1) CAPSULE DAILY 30 capsule 1 Past Week at Unknown time  . FERREX 150 150 MG capsule Take 1 capsule by mouth daily.   Past Week at Unknown time  . furosemide (LASIX) 40 MG tablet Take 1 tablet by mouth every other day.    Past Week at Unknown time  . metoprolol tartrate (LOPRESSOR) 25 MG tablet TAKE (1) TABLET  TWICE A DAY. 60 tablet 4 Past Week at Unknown time  . Multiple Vitamin (MULTIVITAMIN WITH MINERALS) TABS Take 1 tablet by mouth daily.   Past Week at Unknown time  . nitroGLYCERIN (NITROSTAT) 0.4 MG SL tablet Place 1 tablet (0.4 mg total) under the tongue every 5 (five) minutes as needed for chest pain. 25 tablet 2 Past Week at Unknown time  . omega-3 acid ethyl esters (LOVAZA) 1 G capsule TAKE (2) CAPSULES TWICE DAILY. 120 capsule 2 Past Week at Unknown time  . pantoprazole (PROTONIX) 40 MG tablet TAKE 1 TABLET DAILY 30 tablet 2 Past Week at Unknown time  . Polyethyl Glycol-Propyl Glycol (SYSTANE OP) Place 1 drop into both eyes 2 (two) times daily as needed (dry eyes).    Past Week at Unknown time  . Probiotic Product (PRO-BIOTIC BLEND) CAPS Take 1 capsule by mouth daily.   Past Week at Unknown time  . terazosin (HYTRIN) 2 MG capsule TAKE 1 CAPSULE AT BEDTIME 30  capsule 4 Past Week at Unknown time  . tiotropium (SPIRIVA) 18 MCG inhalation capsule Place 1 capsule (18 mcg total) into inhaler and inhale daily. 30 capsule 3 Past Week at Unknown time  . triamcinolone cream (KENALOG) 0.1 % Apply 1 application topically 2 (two) times daily. 80 g 0 Past Week at Unknown time  . warfarin (COUMADIN) 4 MG tablet Take 1 and 1/2 tablets (= '6mg'$ ) daily except on Sundays and Thursdays take 1 tablet (='4mg'$ ) 45 tablet 0 Past Week at Unknown time  . Incontinence Supply Disposable (INCONTINENCE BRIEF LARGE) MISC Use as needed for urinary incontinence.  Dx: urinary incontinence R32 and prostate cancer C61 200 each 1 Taking    Assessment: 76yo male on chronic Coumadin PTA.  INR slightly below goal today on admission.  Pt on chronic Coumadin for afib.  Goal of Therapy:  INR 2-3 Monitor platelets by anticoagulation protocol: Yes   Plan:  Coumadin '6mg'$  today x 1 (home dose) INR daily  [place] Crane, Leyani Gargus A 01/06/2017,8:32 AM

## 2017-01-07 LAB — BASIC METABOLIC PANEL
Anion gap: 9 (ref 5–15)
BUN: 67 mg/dL — AB (ref 6–20)
CO2: 25 mmol/L (ref 22–32)
CREATININE: 3.18 mg/dL — AB (ref 0.61–1.24)
Calcium: 8.5 mg/dL — ABNORMAL LOW (ref 8.9–10.3)
Chloride: 102 mmol/L (ref 101–111)
GFR calc Af Amer: 20 mL/min — ABNORMAL LOW (ref >60)
GFR calc non Af Amer: 17 mL/min — ABNORMAL LOW (ref >60)
Glucose, Bld: 200 mg/dL — ABNORMAL HIGH (ref 65–99)
POTASSIUM: 4.1 mmol/L (ref 3.5–5.1)
SODIUM: 136 mmol/L (ref 135–145)

## 2017-01-07 LAB — CBC
HCT: 34.2 % — ABNORMAL LOW (ref 39.0–52.0)
Hemoglobin: 12.1 g/dL — ABNORMAL LOW (ref 13.0–17.0)
MCH: 34.4 pg — ABNORMAL HIGH (ref 26.0–34.0)
MCHC: 35.4 g/dL (ref 30.0–36.0)
MCV: 97.2 fL (ref 78.0–100.0)
PLATELETS: 161 10*3/uL (ref 150–400)
RBC: 3.52 MIL/uL — ABNORMAL LOW (ref 4.22–5.81)
RDW: 13.8 % (ref 11.5–15.5)
WBC: 14.4 10*3/uL — AB (ref 4.0–10.5)

## 2017-01-07 LAB — PROTIME-INR
INR: 2.53
Prothrombin Time: 27.7 seconds — ABNORMAL HIGH (ref 11.4–15.2)

## 2017-01-07 MED ORDER — IPRATROPIUM BROMIDE 0.02 % IN SOLN
0.5000 mg | RESPIRATORY_TRACT | Status: DC
  Administered 2017-01-07 – 2017-01-09 (×14): 0.5 mg via RESPIRATORY_TRACT
  Filled 2017-01-07 (×14): qty 2.5

## 2017-01-07 MED ORDER — LEVALBUTEROL HCL 0.63 MG/3ML IN NEBU
0.6300 mg | INHALATION_SOLUTION | RESPIRATORY_TRACT | Status: DC
  Administered 2017-01-07 – 2017-01-09 (×14): 0.63 mg via RESPIRATORY_TRACT
  Filled 2017-01-07 (×14): qty 3

## 2017-01-07 MED ORDER — WARFARIN SODIUM 2 MG PO TABS
4.0000 mg | ORAL_TABLET | Freq: Once | ORAL | Status: AC
  Administered 2017-01-07: 4 mg via ORAL
  Filled 2017-01-07: qty 2

## 2017-01-07 NOTE — Progress Notes (Signed)
PROGRESS NOTE    Don [name]  XTG:[id number] DOB: 1940/12/22 DOA: 01/05/2017 PCP: Wardell Honour, MD    Brief Narrative:  76 year old male with history of COPD and tobacco abuse, came to the emergency room with complaints of shortness of breath. Found to have COPD exacerbation and tested positive for influenza. He also had rapid atrial fibrillation requiring Cardizem infusion. Started on steroids, bronchodilators and antibiotics. Plans are to wean off Cardizem infusion. Continue pulmonary regimen.   Assessment & Plan:   Active Problems:   Hypertension   Tobacco abuse   Stage III chronic kidney disease   COPD exacerbation (HCC)   Atrial fibrillation with RVR (HCC)   Acute respiratory failure with hypoxia (HCC)   AKI (acute kidney injury) ([place])   Influenza A   1. Acute respiratory failure. Related to COPD exacerbation. We'll start to wean off oxygen as his clinical condition improves.  2. COPD exacerbation. Continue the patient on IV steroids, antibiotics and bronchodilators. Continue pulmonary hygiene.  3. Rapid atrial fibrillation. Likely precipitated by underlying respiratory issues. He does take metoprolol at home, which is being held for now due to significant bronchospasms. He also takes diltiazem. Patient is currently on on intravenous diltiazem in order to achieve better control. Heart rate is currently uncontrolled. Continue oral diltiazem in the hopes of weaning him off the infusion. He is anticoagulated with Coumadin. TSH normal. Echocardiogram unremarkable.  4. AKI on CKD3. Creatinine is currently at 3. He does have lower extremity edema but reports this is chronic and is at baseline. Started on IV fluids with no improvement in renal function. Continue to monitor. Currently started back on lasix. Urine output has been fair.  5. Hypertension. Blood pressures currently stable. Continue current treatments.  6. Tobacco use. Counseled on importance of tobacco  cessation.  7. Influenza A. Started on tamiflu   DVT prophylaxis: Coumadin Code Status: Partial code, no CPR, no defibrillation Family Communication: No family present Disposition Plan: Discharge home once improved   Consultants:     Procedures:  Echo:- Left ventricle: The cavity size was normal. Systolic function was   normal. The estimated ejection fraction was in the range of 60%   to 65%. Wall motion was normal; there were no regional wall   motion abnormalities. The study was not technically sufficient to   allow evaluation of LV diastolic dysfunction due to atrial   fibrillation. Mild concentric and moderate focal basal septal   hypertrophy. - Aortic valve: Trileaflet; mildly calcified leaflets. There was no   stenosis. - Mitral valve: Moderately calcified annulus.  - Left atrium: The atrium was mildly dilated.  Antimicrobials:   Levofloxacin 2/23>>    Subjective: Feels a little better. Still short of breath. Non productive cough  Objective: Vitals:   01/07/17 0400 01/07/17 0402 01/07/17 0500 01/07/17 0826  BP: 119/68  134/71   Pulse: (!) 112  (!) 128   Resp: 17  (!) 23   Temp: 97.7 F (36.5 C)     TempSrc: Oral     SpO2: 96% 96% 95% 94%  Weight:   76.3 kg (168 lb 3.4 oz)   Height:        Intake/Output Summary (Last 24 hours) at 01/07/17 0940 Last data filed at 01/06/17 2200  Gross per 24 hour  Intake           468.58 ml  Output             1851 ml  Net         -  1382.42 ml   Filed Weights   01/05/17 1347 01/06/17 0400 01/07/17 0500  Weight: 77.1 kg (169 lb 15.6 oz) 75.8 kg (167 lb 1.7 oz) 76.3 kg (168 lb 3.4 oz)    Examination:  General exam: Appears calm and comfortable  Respiratory system: bilateral rhonchi and wheezes. Respiratory effort normal. Cardiovascular system: S1 & S2 heard, irregular. No JVD, murmurs, rubs, gallops or clicks. 1+ pedal edema. Gastrointestinal system: Abdomen is nondistended, soft and nontender. No organomegaly or  masses felt. Normal bowel sounds heard. Central nervous system: Alert and oriented. No focal neurological deficits. Extremities: Symmetric 5 x 5 power. Skin: No rashes, lesions or ulcers Psychiatry: Judgement and insight appear normal. Mood & affect appropriate.     Data Reviewed: I have personally reviewed following labs and imaging studies  CBC:  Recent Labs Lab 01/05/17 0846 01/06/17 0430 01/07/17 0533  WBC 5.9 9.5 14.4*  NEUTROABS 4.8  --   --   HGB 14.0 11.8* 12.1*  HCT 40.9 33.0* 34.2*  MCV 99.0 97.1 97.2  PLT 152 150 235   Basic Metabolic Panel:  Recent Labs Lab 01/05/17 0846 01/06/17 0430 01/07/17 0533  NA 135 136 136  K 4.8 4.1 4.1  CL 98* 102 102  CO2 '28 24 25  '$ GLUCOSE 110* 187* 200*  BUN 48* 56* 67*  CREATININE 3.07* 3.16* 3.18*  CALCIUM 8.9 8.6* 8.5*   GFR: Estimated Creatinine Clearance: 19.5 mL/min (by C-G formula based on SCr of 3.18 mg/dL (H)). Liver Function Tests:  Recent Labs Lab 01/05/17 0846  AST 47*  ALT 18  ALKPHOS 56  BILITOT 1.0  PROT 6.3*  ALBUMIN 3.2*   No results for input(s): LIPASE, AMYLASE in the last 168 hours. No results for input(s): AMMONIA in the last 168 hours. Coagulation Profile:  Recent Labs Lab 01/05/17 0846 01/06/17 0430 01/07/17 0533  INR 1.98 2.08 2.53   Cardiac Enzymes:  Recent Labs Lab 01/05/17 0846 01/05/17 1434 01/05/17 1933 01/06/17 0119  TROPONINI 0.03* 0.03* 0.04* 0.03*   BNP (last 3 results) No results for input(s): PROBNP in the last 8760 hours. HbA1C: No results for input(s): HGBA1C in the last 72 hours. CBG: No results for input(s): GLUCAP in the last 168 hours. Lipid Profile: No results for input(s): CHOL, HDL, LDLCALC, TRIG, CHOLHDL, LDLDIRECT in the last 72 hours. Thyroid Function Tests:  Recent Labs  01/05/17 1434  TSH 1.230   Anemia Panel: No results for input(s): VITAMINB12, FOLATE, FERRITIN, TIBC, IRON, RETICCTPCT in the last 72 hours. Sepsis Labs: No results for  input(s): PROCALCITON, LATICACIDVEN in the last 168 hours.  Recent Results (from the past 240 hour(s))  MRSA PCR Screening     Status: None   Collection Time: 01/05/17  1:34 PM  Result Value Ref Range Status   MRSA by PCR NEGATIVE NEGATIVE Final    Comment:        The GeneXpert MRSA Assay (FDA approved for NASAL specimens only), is one component of a comprehensive MRSA colonization surveillance program. It is not intended to diagnose MRSA infection nor to guide or monitor treatment for MRSA infections.          Radiology Studies: Dg Chest Port 1 View  Result Date: 01/05/2017 CLINICAL DATA:  Short of breath 4 days EXAM: PORTABLE CHEST 1 VIEW COMPARISON:  Radiograph 2126 FINDINGS: Normal cardiac silhouette. No effusion, infiltrate, or pneumothorax. Patient rotated rightward. No acute osseous abnormality. IMPRESSION: No acute cardiopulmonary process. Electronically Signed   By: Helane Gunther.D.  On: 01/05/2017 09:58        Scheduled Meds: . atorvastatin  20 mg Oral q1800  . diltiazem  90 mg Oral Q6H  . DULoxetine  60 mg Oral Daily  . furosemide  40 mg Intravenous Daily  . guaiFENesin  1,200 mg Oral BID  . ipratropium  0.5 mg Nebulization Q4H WA  . iron polysaccharides  150 mg Oral Daily  . levalbuterol  0.63 mg Nebulization Q4H WA  . levofloxacin  750 mg Oral Q48H  . methylPREDNISolone (SOLU-MEDROL) injection  60 mg Intravenous Q6H  . mometasone-formoterol  2 puff Inhalation BID  . omega-3 acid ethyl esters  2 g Oral BID  . oseltamivir  30 mg Oral Q2000  . pantoprazole  40 mg Oral Daily  . polyethylene glycol  17 g Oral Daily  . terazosin  2 mg Oral QHS  . Warfarin - Pharmacist Dosing Inpatient   Does not apply Q24H   Continuous Infusions: . diltiazem (CARDIZEM) infusion Stopped (01/06/17 2200)     LOS: 2 days    Time spent: 77mns    MEMON,JEHANZEB, MD [facility] Pager [phone] If 7PM-7AM, please contact  night-coverage [email or web address] Password TA[facility]2/25/2018, 9:40 AM

## 2017-01-07 NOTE — Progress Notes (Signed)
[place] for Coumadin (chronic Rx PTA) Indication: atrial fibrillation  Allergies  Allergen Reactions  . Wellbutrin [Bupropion] Anxiety    Patient Measurements: Height: '5\' 9"'$  (175.3 cm) Weight: 168 lb 3.4 oz (76.3 kg) IBW/kg (Calculated) : 70.7  Vital Signs: Temp: 97.7 F (36.5 C) (02/25 0400) Temp Source: Oral (02/25 0400) BP: 134/71 (02/25 0500) Pulse Rate: 128 (02/25 0500)  Labs:  Recent Labs  01/05/17 0846 01/05/17 1434 01/05/17 1933 01/06/17 0119 01/06/17 0430 01/07/17 0533  HGB 14.0  --   --   --  11.8* 12.1*  HCT 40.9  --   --   --  33.0* 34.2*  PLT 152  --   --   --  150 161  LABPROT 22.8*  --   --   --  23.7* 27.7*  INR 1.98  --   --   --  2.08 2.53  CREATININE 3.07*  --   --   --  3.16* 3.18*  TROPONINI 0.03* 0.03* 0.04* 0.03*  --   --     Estimated Creatinine Clearance: 19.5 mL/min (by C-G formula based on SCr of 3.18 mg/dL (H)).   Medical History: Past Medical History:  Diagnosis Date  . Acute bronchitis 04/03/2014  . Anxiety   . Atrial fibrillation with RVR ([place]) 04/03/2014  . Cataract   . COPD (chronic obstructive pulmonary disease) ([place])   . Hyperlipidemia   . Hypertension   . Macular degeneration   . Noncompliance with medications 04/2013   xarelto, digoxin  . Pre-diabetes   . Prostate cancer ([place])   . Stage III chronic kidney disease 04/03/2014  . Tobacco abuse    ongoing  . Tubular adenoma of colon 07/31/02, 11/18/03    Medications:  Prescriptions Prior to Admission  Medication Sig Dispense Refill Last Dose  . albuterol (PROVENTIL HFA;VENTOLIN HFA) 108 (90 BASE) MCG/ACT inhaler Inhale 2 puffs into the lungs every 6 (six) hours as needed for wheezing or shortness of breath. 1 Inhaler 2 Past Week at Unknown time  . ALLERGY RELIEF 10 MG tablet TAKE 1 TABLET DAILY FOR ALLERGY 30 tablet 5 Past Week at Unknown time  . atorvastatin (LIPITOR) 20 MG tablet TAKE 1 TABLET DAILY 30 tablet 2 Past Week at  Unknown time  . budesonide-formoterol (SYMBICORT) 160-4.5 MCG/ACT inhaler Inhale 2 puffs into the lungs 2 (two) times daily. 1 Inhaler 3 Past Week at Unknown time  . clonazePAM (KLONOPIN) 1 MG tablet TAKE 1/2 TABLET ONCE DAILY AS NEEDED FOR ANXIETY 15 tablet 0 Past Week at Unknown time  . diltiazem (CARDIZEM CD) 300 MG 24 hr capsule TAKE (1) CAPSULE DAILY 30 capsule 1 Past Week at Unknown time  . doxycycline (ADOXA) 100 MG tablet Take 1 tablet (100 mg total) by mouth 2 (two) times daily. 20 tablet 0 Past Week at Unknown time  . doxycycline (VIBRA-TABS) 100 MG tablet Take 1 tablet by mouth 2 (two) times daily.   Past Week at Unknown time  . doxylamine, Sleep, (UNISOM) 25 MG tablet Take 25 mg by mouth at bedtime as needed.   Past Week at Unknown time  . DULoxetine (CYMBALTA) 60 MG capsule TAKE (1) CAPSULE DAILY 30 capsule 1 Past Week at Unknown time  . FERREX 150 150 MG capsule Take 1 capsule by mouth daily.   Past Week at Unknown time  . furosemide (LASIX) 40 MG tablet Take 1 tablet by mouth every other day.    Past Week at Unknown time  .  metoprolol tartrate (LOPRESSOR) 25 MG tablet TAKE (1) TABLET TWICE A DAY. 60 tablet 4 Past Week at Unknown time  . Multiple Vitamin (MULTIVITAMIN WITH MINERALS) TABS Take 1 tablet by mouth daily.   Past Week at Unknown time  . nitroGLYCERIN (NITROSTAT) 0.4 MG SL tablet Place 1 tablet (0.4 mg total) under the tongue every 5 (five) minutes as needed for chest pain. 25 tablet 2 Past Week at Unknown time  . omega-3 acid ethyl esters (LOVAZA) 1 G capsule TAKE (2) CAPSULES TWICE DAILY. 120 capsule 2 Past Week at Unknown time  . pantoprazole (PROTONIX) 40 MG tablet TAKE 1 TABLET DAILY 30 tablet 2 Past Week at Unknown time  . Polyethyl Glycol-Propyl Glycol (SYSTANE OP) Place 1 drop into both eyes 2 (two) times daily as needed (dry eyes).    Past Week at Unknown time  . Probiotic Product (PRO-BIOTIC BLEND) CAPS Take 1 capsule by mouth daily.   Past Week at Unknown time  .  terazosin (HYTRIN) 2 MG capsule TAKE 1 CAPSULE AT BEDTIME 30 capsule 4 Past Week at Unknown time  . tiotropium (SPIRIVA) 18 MCG inhalation capsule Place 1 capsule (18 mcg total) into inhaler and inhale daily. 30 capsule 3 Past Week at Unknown time  . triamcinolone cream (KENALOG) 0.1 % Apply 1 application topically 2 (two) times daily. 80 g 0 Past Week at Unknown time  . warfarin (COUMADIN) 4 MG tablet Take 1 and 1/2 tablets (= '6mg'$ ) daily except on Sundays and Thursdays take 1 tablet (='4mg'$ ) 45 tablet 0 Past Week at Unknown time  . Incontinence Supply Disposable (INCONTINENCE BRIEF LARGE) MISC Use as needed for urinary incontinence.  Dx: urinary incontinence R32 and prostate cancer C61 200 each 1 Taking   Assessment: 76yo male on chronic Coumadin PTA.  INR is therapeutic.  Pt on chronic Coumadin for afib.  Goal of Therapy:  INR 2-3 Monitor platelets by anticoagulation protocol: Yes   Plan:  Coumadin '4mg'$  today x 1 (home dose) INR daily  [place] Crane, Jaclin Finks A 01/07/2017,9:41 AM

## 2017-01-08 ENCOUNTER — Encounter (HOSPITAL_COMMUNITY): Payer: Self-pay | Admitting: Cardiology

## 2017-01-08 DIAGNOSIS — I4891 Unspecified atrial fibrillation: Secondary | ICD-10-CM

## 2017-01-08 DIAGNOSIS — N183 Chronic kidney disease, stage 3 (moderate): Secondary | ICD-10-CM

## 2017-01-08 DIAGNOSIS — J441 Chronic obstructive pulmonary disease with (acute) exacerbation: Secondary | ICD-10-CM

## 2017-01-08 DIAGNOSIS — J101 Influenza due to other identified influenza virus with other respiratory manifestations: Principal | ICD-10-CM

## 2017-01-08 LAB — CBC
HCT: 35.2 % — ABNORMAL LOW (ref 39.0–52.0)
Hemoglobin: 12.3 g/dL — ABNORMAL LOW (ref 13.0–17.0)
MCH: 34.1 pg — AB (ref 26.0–34.0)
MCHC: 34.9 g/dL (ref 30.0–36.0)
MCV: 97.5 fL (ref 78.0–100.0)
PLATELETS: 163 10*3/uL (ref 150–400)
RBC: 3.61 MIL/uL — ABNORMAL LOW (ref 4.22–5.81)
RDW: 13.9 % (ref 11.5–15.5)
WBC: 12.1 10*3/uL — AB (ref 4.0–10.5)

## 2017-01-08 LAB — SODIUM, URINE, RANDOM: Sodium, Ur: <10 mmol/L

## 2017-01-08 LAB — BASIC METABOLIC PANEL
Anion gap: 10 (ref 5–15)
BUN: 81 mg/dL — AB (ref 6–20)
CHLORIDE: 102 mmol/L (ref 101–111)
CO2: 23 mmol/L (ref 22–32)
CREATININE: 3.71 mg/dL — AB (ref 0.61–1.24)
Calcium: 8.7 mg/dL — ABNORMAL LOW (ref 8.9–10.3)
GFR calc Af Amer: 17 mL/min — ABNORMAL LOW (ref >60)
GFR calc non Af Amer: 14 mL/min — ABNORMAL LOW (ref >60)
Glucose, Bld: 213 mg/dL — ABNORMAL HIGH (ref 65–99)
POTASSIUM: 4.2 mmol/L (ref 3.5–5.1)
SODIUM: 135 mmol/L (ref 135–145)

## 2017-01-08 LAB — PROTIME-INR
INR: 3.1
Prothrombin Time: 32.6 seconds — ABNORMAL HIGH (ref 11.4–15.2)

## 2017-01-08 LAB — CREATININE, URINE, RANDOM: Creatinine, Urine: 117.7 mg/dL

## 2017-01-08 MED ORDER — LEVOFLOXACIN 500 MG PO TABS
500.0000 mg | ORAL_TABLET | ORAL | Status: DC
  Administered 2017-01-09 – 2017-01-11 (×2): 500 mg via ORAL
  Filled 2017-01-08 (×2): qty 1

## 2017-01-08 MED ORDER — PROPOFOL 1000 MG/100ML IV EMUL
INTRAVENOUS | Status: AC
  Filled 2017-01-08: qty 100

## 2017-01-08 MED ORDER — DILTIAZEM HCL-DEXTROSE 100-5 MG/100ML-% IV SOLN (PREMIX)
5.0000 mg/h | INTRAVENOUS | Status: DC
  Administered 2017-01-08 – 2017-01-09 (×3): 10 mg/h via INTRAVENOUS
  Filled 2017-01-08 (×2): qty 100

## 2017-01-08 MED ORDER — SODIUM CHLORIDE 0.45 % IV SOLN
INTRAVENOUS | Status: DC
  Administered 2017-01-08 – 2017-01-09 (×2): via INTRAVENOUS

## 2017-01-08 NOTE — Progress Notes (Signed)
[place] for Coumadin (chronic Rx PTA) Indication: atrial fibrillation  Allergies  Allergen Reactions  . Wellbutrin [Bupropion] Anxiety    Patient Measurements: Height: '5\' 9"'$  (175.3 cm) Weight: 169 lb 12.1 oz (77 kg) IBW/kg (Calculated) : 70.7  Vital Signs: Temp: 98 F (36.7 C) (02/26 1055) Temp Source: Oral (02/26 1055) BP: 100/63 (02/26 1030) Pulse Rate: 101 (02/26 1030)  Labs:  Recent Labs  01/05/17 1434 01/05/17 1933 01/06/17 0119  01/06/17 0430 01/07/17 0533 01/08/17 0505  HGB  --   --   --   < > 11.8* 12.1* 12.3*  HCT  --   --   --   --  33.0* 34.2* 35.2*  PLT  --   --   --   --  150 161 163  LABPROT  --   --   --   --  23.7* 27.7* 32.6*  INR  --   --   --   --  2.08 2.53 3.10  CREATININE  --   --   --   --  3.16* 3.18* 3.71*  TROPONINI 0.03* 0.04* 0.03*  --   --   --   --   < > = values in this interval not displayed.  Estimated Creatinine Clearance: 16.7 mL/min (by C-G formula based on SCr of 3.71 mg/dL (H)).   Medical History: Past Medical History:  Diagnosis Date  . Acute bronchitis 04/03/2014  . Anxiety   . Atrial fibrillation with RVR ([place]) 04/03/2014  . Cataract   . COPD (chronic obstructive pulmonary disease) ([place])   . Hyperlipidemia   . Hypertension   . Macular degeneration   . Noncompliance with medications 04/2013   xarelto, digoxin  . Pre-diabetes   . Prostate cancer ([place])   . Stage III chronic kidney disease 04/03/2014  . Tobacco abuse    ongoing  . Tubular adenoma of colon 07/31/02, 11/18/03    Medications:  Prescriptions Prior to Admission  Medication Sig Dispense Refill Last Dose  . albuterol (PROVENTIL HFA;VENTOLIN HFA) 108 (90 BASE) MCG/ACT inhaler Inhale 2 puffs into the lungs every 6 (six) hours as needed for wheezing or shortness of breath. 1 Inhaler 2 Past Week at Unknown time  . ALLERGY RELIEF 10 MG tablet TAKE 1 TABLET DAILY FOR ALLERGY 30 tablet 5 Past Week at Unknown time  .  atorvastatin (LIPITOR) 20 MG tablet TAKE 1 TABLET DAILY 30 tablet 2 Past Week at Unknown time  . budesonide-formoterol (SYMBICORT) 160-4.5 MCG/ACT inhaler Inhale 2 puffs into the lungs 2 (two) times daily. 1 Inhaler 3 Past Week at Unknown time  . clonazePAM (KLONOPIN) 1 MG tablet TAKE 1/2 TABLET ONCE DAILY AS NEEDED FOR ANXIETY 15 tablet 0 Past Week at Unknown time  . diltiazem (CARDIZEM CD) 300 MG 24 hr capsule TAKE (1) CAPSULE DAILY 30 capsule 1 Past Week at Unknown time  . doxycycline (ADOXA) 100 MG tablet Take 1 tablet (100 mg total) by mouth 2 (two) times daily. 20 tablet 0 Past Week at Unknown time  . doxycycline (VIBRA-TABS) 100 MG tablet Take 1 tablet by mouth 2 (two) times daily.   Past Week at Unknown time  . doxylamine, Sleep, (UNISOM) 25 MG tablet Take 25 mg by mouth at bedtime as needed.   Past Week at Unknown time  . DULoxetine (CYMBALTA) 60 MG capsule TAKE (1) CAPSULE DAILY 30 capsule 1 Past Week at Unknown time  . FERREX 150 150 MG capsule Take 1 capsule by  mouth daily.   Past Week at Unknown time  . furosemide (LASIX) 40 MG tablet Take 1 tablet by mouth every other day.    Past Week at Unknown time  . metoprolol tartrate (LOPRESSOR) 25 MG tablet TAKE (1) TABLET TWICE A DAY. 60 tablet 4 Past Week at Unknown time  . Multiple Vitamin (MULTIVITAMIN WITH MINERALS) TABS Take 1 tablet by mouth daily.   Past Week at Unknown time  . nitroGLYCERIN (NITROSTAT) 0.4 MG SL tablet Place 1 tablet (0.4 mg total) under the tongue every 5 (five) minutes as needed for chest pain. 25 tablet 2 Past Week at Unknown time  . omega-3 acid ethyl esters (LOVAZA) 1 G capsule TAKE (2) CAPSULES TWICE DAILY. 120 capsule 2 Past Week at Unknown time  . pantoprazole (PROTONIX) 40 MG tablet TAKE 1 TABLET DAILY 30 tablet 2 Past Week at Unknown time  . Polyethyl Glycol-Propyl Glycol (SYSTANE OP) Place 1 drop into both eyes 2 (two) times daily as needed (dry eyes).    Past Week at Unknown time  . Probiotic Product  (PRO-BIOTIC BLEND) CAPS Take 1 capsule by mouth daily.   Past Week at Unknown time  . terazosin (HYTRIN) 2 MG capsule TAKE 1 CAPSULE AT BEDTIME 30 capsule 4 Past Week at Unknown time  . tiotropium (SPIRIVA) 18 MCG inhalation capsule Place 1 capsule (18 mcg total) into inhaler and inhale daily. 30 capsule 3 Past Week at Unknown time  . triamcinolone cream (KENALOG) 0.1 % Apply 1 application topically 2 (two) times daily. 80 g 0 Past Week at Unknown time  . warfarin (COUMADIN) 4 MG tablet Take 1 and 1/2 tablets (= '6mg'$ ) daily except on Sundays and Thursdays take 1 tablet (='4mg'$ ) 45 tablet 0 Past Week at Unknown time  . Incontinence Supply Disposable (INCONTINENCE BRIEF LARGE) MISC Use as needed for urinary incontinence.  Dx: urinary incontinence R32 and prostate cancer C61 200 each 1 Taking   Assessment: 76yo male on chronic Coumadin PTA.  INR is trending up, now SUPRAtherapeutic.  Pt on chronic Coumadin for afib.  Goal of Therapy:  INR 2-3 Monitor platelets by anticoagulation protocol: Yes   Plan:  HOLD coumadin today, allow INR to trend down INR daily  [place] Crane, Arliss Frisina A 01/08/2017,10:56 AM

## 2017-01-08 NOTE — Progress Notes (Signed)
PROGRESS NOTE    Don ARPINO  TGG:[id number] DOB: 1941-08-14 DOA: 01/05/2017 PCP: Wardell Honour, MD    Brief Narrative:  76 year old male with history of COPD and tobacco abuse, came to the emergency room with complaints of shortness of breath. Found to have COPD exacerbation and tested positive for influenza. He also had rapid atrial fibrillation requiring Cardizem infusion. Started on steroids, bronchodilators and antibiotics. Plans are to wean off Cardizem infusion. Continue pulmonary regimen.   Assessment & Plan:   Active Problems:   Hypertension   Tobacco abuse   Stage III chronic kidney disease   COPD exacerbation (HCC)   Atrial fibrillation with RVR (HCC)   Acute respiratory failure with hypoxia (HCC)   AKI (acute kidney injury) ([place])   Influenza A   1. Acute respiratory failure. Related to COPD exacerbation. We'll start to wean off oxygen as his clinical condition improves.  2. COPD exacerbation. Continue the patient on IV steroids, antibiotics and bronchodilators. Continues to wheeze and is short of breath. Continue pulmonary hygiene.  3. Rapid atrial fibrillation. Likely precipitated by underlying respiratory issues. He does take metoprolol at home, which is being held for now due to significant bronchospasms. He also takes diltiazem. Patient is currently on intravenous diltiazem in order to achieve better control. Heart rate is currently uncontrolled. He is also on oral diltiazem in the hopes of weaning him off the infusion. Blood pressures are low making further titration of medications challenging. Digoxin not good option due to renal issues. Could consider amiodarone. Will request cardiology input. He is anticoagulated with Coumadin. TSH normal. Echocardiogram unremarkable.  4. AKI on CKD3. Creatinine continues to trend up at 3.7. BUN also trending up. Will discontinue lasix. He does have lower extremity edema but reports this is chronic and is at baseline. Will  request nephrology input.   5. Hypertension. Blood pressures currently stable. Continue current treatments.  6. Tobacco use. Counseled on importance of tobacco cessation.  7. Influenza A. On tamiflu   DVT prophylaxis: Coumadin Code Status: Partial code, no CPR, no defibrillation Family Communication: No family present Disposition Plan: Discharge home once improved   Consultants:     Procedures:  Echo:- Left ventricle: The cavity size was normal. Systolic function was   normal. The estimated ejection fraction was in the range of 60%   to 65%. Wall motion was normal; there were no regional wall   motion abnormalities. The study was not technically sufficient to   allow evaluation of LV diastolic dysfunction due to atrial   fibrillation. Mild concentric and moderate focal basal septal   hypertrophy. - Aortic valve: Trileaflet; mildly calcified leaflets. There was no   stenosis. - Mitral valve: Moderately calcified annulus.  - Left atrium: The atrium was mildly dilated.  Antimicrobials:   Levofloxacin 2/23>>    Subjective: Feels about the same as yesterday, has non productive cough, continues to wheeze  Objective: Vitals:   01/08/17 0900 01/08/17 0920 01/08/17 0930 01/08/17 1000  BP: (!) 68/38 (!) 102/58 (!) 105/58 107/71  Pulse: (!) 103  (!) 109 (!) 104  Resp: (!) 26  19 (!) 32  Temp:      TempSrc:      SpO2: 96%  97% 96%  Weight:      Height:        Intake/Output Summary (Last 24 hours) at 01/08/17 1042 Last data filed at 01/08/17 0847  Gross per 24 hour  Intake  370 ml  Output             1300 ml  Net             -930 ml   Filed Weights   01/06/17 0400 01/07/17 0500 01/08/17 0400  Weight: 75.8 kg (167 lb 1.7 oz) 76.3 kg (168 lb 3.4 oz) 77 kg (169 lb 12.1 oz)    Examination:  General exam: Appears calm and comfortable  Respiratory system: bilateral rhonchi and wheezes. Respiratory effort normal. Cardiovascular system: S1 & S2 heard,  irregular. No JVD, murmurs, rubs, gallops or clicks. 1+ pedal edema. Gastrointestinal system: Abdomen is nondistended, soft and nontender. No organomegaly or masses felt. Normal bowel sounds heard. Central nervous system: Alert and oriented. No focal neurological deficits. Extremities: Symmetric 5 x 5 power. Skin: No rashes, lesions or ulcers Psychiatry: Judgement and insight appear normal. Mood & affect appropriate.     Data Reviewed: I have personally reviewed following labs and imaging studies  CBC:  Recent Labs Lab 01/05/17 0846 01/06/17 0430 01/07/17 0533 01/08/17 0505  WBC 5.9 9.5 14.4* 12.1*  NEUTROABS 4.8  --   --   --   HGB 14.0 11.8* 12.1* 12.3*  HCT 40.9 33.0* 34.2* 35.2*  MCV 99.0 97.1 97.2 97.5  PLT 152 150 161 299   Basic Metabolic Panel:  Recent Labs Lab 01/05/17 0846 01/06/17 0430 01/07/17 0533 01/08/17 0505  NA 135 136 136 135  K 4.8 4.1 4.1 4.2  CL 98* 102 102 102  CO2 '28 24 25 23  '$ GLUCOSE 110* 187* 200* 213*  BUN 48* 56* 67* 81*  CREATININE 3.07* 3.16* 3.18* 3.71*  CALCIUM 8.9 8.6* 8.5* 8.7*   GFR: Estimated Creatinine Clearance: 16.7 mL/min (by C-G formula based on SCr of 3.71 mg/dL (H)). Liver Function Tests:  Recent Labs Lab 01/05/17 0846  AST 47*  ALT 18  ALKPHOS 56  BILITOT 1.0  PROT 6.3*  ALBUMIN 3.2*   No results for input(s): LIPASE, AMYLASE in the last 168 hours. No results for input(s): AMMONIA in the last 168 hours. Coagulation Profile:  Recent Labs Lab 01/05/17 0846 01/06/17 0430 01/07/17 0533 01/08/17 0505  INR 1.98 2.08 2.53 3.10   Cardiac Enzymes:  Recent Labs Lab 01/05/17 0846 01/05/17 1434 01/05/17 1933 01/06/17 0119  TROPONINI 0.03* 0.03* 0.04* 0.03*   BNP (last 3 results) No results for input(s): PROBNP in the last 8760 hours. HbA1C: No results for input(s): HGBA1C in the last 72 hours. CBG: No results for input(s): GLUCAP in the last 168 hours. Lipid Profile: No results for input(s): CHOL,  HDL, LDLCALC, TRIG, CHOLHDL, LDLDIRECT in the last 72 hours. Thyroid Function Tests:  Recent Labs  01/05/17 1434  TSH 1.230   Anemia Panel: No results for input(s): VITAMINB12, FOLATE, FERRITIN, TIBC, IRON, RETICCTPCT in the last 72 hours. Sepsis Labs: No results for input(s): PROCALCITON, LATICACIDVEN in the last 168 hours.  Recent Results (from the past 240 hour(s))  MRSA PCR Screening     Status: None   Collection Time: 01/05/17  1:34 PM  Result Value Ref Range Status   MRSA by PCR NEGATIVE NEGATIVE Final    Comment:        The GeneXpert MRSA Assay (FDA approved for NASAL specimens only), is one component of a comprehensive MRSA colonization surveillance program. It is not intended to diagnose MRSA infection nor to guide or monitor treatment for MRSA infections.          Radiology Studies: No  results found.      Scheduled Meds: . atorvastatin  20 mg Oral q1800  . diltiazem  90 mg Oral Q6H  . DULoxetine  60 mg Oral Daily  . guaiFENesin  1,200 mg Oral BID  . ipratropium  0.5 mg Nebulization Q4H WA  . iron polysaccharides  150 mg Oral Daily  . levalbuterol  0.63 mg Nebulization Q4H WA  . levofloxacin  750 mg Oral Q48H  . methylPREDNISolone (SOLU-MEDROL) injection  60 mg Intravenous Q6H  . mometasone-formoterol  2 puff Inhalation BID  . omega-3 acid ethyl esters  2 g Oral BID  . oseltamivir  30 mg Oral Q2000  . pantoprazole  40 mg Oral Daily  . polyethylene glycol  17 g Oral Daily  . terazosin  2 mg Oral QHS  . Warfarin - Pharmacist Dosing Inpatient   Does not apply Q24H   Continuous Infusions: . diltiazem (CARDIZEM) infusion 10 mg/hr (01/08/17 0600)     LOS: 3 days    Time spent: 75mns    Jaykob Minichiello, MD [facility] Pager [phone] If 7PM-7AM, please contact night-coverage [email or web address] Password [facility]/26/2018, 10:42 AM

## 2017-01-08 NOTE — Consult Note (Signed)
Reason for Consult: Acute kidney injury superimposed on chronic Referring Physician: Dr. Ferdie Ping Don [name] is an 76 y.o. male.  HPI: He is a patient to see history of for hypertension, prostate CA, chronic renal failure stage IV, history of proteinuria presently came with complaints of difficulty breathing, increased leg swelling, cough with sputum production for 4-5 days duration. When he was evaluated and was found to have exacerbation of COPD and influenza hence patient is treated for that. Presently his renal function seems to be worsening hence consult is called. Patient states that his breathing is much better and has less cough. He denies any nausea or vomiting. Patient presently states that he is not making that much amount of urine. He denies urgency or frequency.  Past Medical History:  Diagnosis Date  . Acute bronchitis 04/03/2014  . Anxiety   . Atrial fibrillation ([place])   . Cataract   . COPD (chronic obstructive pulmonary disease) ([place])   . Essential hypertension   . Hyperlipidemia   . Macular degeneration   . Noncompliance with medications 04/2013   Xarelto, digoxin previously  . Pre-diabetes   . Prostate cancer ([place])   . Stage III chronic kidney disease 04/03/2014  . Tubular adenoma of colon 07/31/02, 11/18/03    Past Surgical History:  Procedure Laterality Date  . Anal abcess,Hemorroids,    . COLONOSCOPY N/A 04/05/2014   Procedure: COLONOSCOPY;  Surgeon: Ladene Artist, MD;  Location: [facility] ENDOSCOPY;  Service: Endoscopy;  Laterality: N/A;  . COLONOSCOPY W/ BIOPSIES  11/18/2003   Dr. Earle Gell  . ESOPHAGOGASTRODUODENOSCOPY  11/18/2003   Dr. Earle Gell  . ESOPHAGOGASTRODUODENOSCOPY N/A 04/05/2014   Procedure: ESOPHAGOGASTRODUODENOSCOPY (EGD);  Surgeon: Ladene Artist, MD;  Location: [facility];  Service: Endoscopy;  Laterality: N/A;  . RETROPUBIC PROSTATECTOMY  11/26/2001  . TONSILLECTOMY      Family History  Problem Relation Age of Onset  . Diabetes  Father   . Heart disease Father     MI  . Heart attack Father   . Heart attack Mother   . Heart disease Brother   . Cancer Brother     lung  . Lung cancer Brother   . Heart disease Brother   . Lung cancer Sister   . Cancer Sister     lung  . Heart disease Brother   . Heart disease Brother     Social History:  reports that he has been smoking Cigarettes and Cigars.  He has a 62.00 pack-year smoking history. He has never used smokeless tobacco. He reports that he does not drink alcohol or use drugs.  Allergies:  Allergies  Allergen Reactions  . Wellbutrin [Bupropion] Anxiety    Medications: I have reviewed the patient's current medications.  Results for orders placed or performed during the hospital encounter of 01/05/17 (from the past 48 hour(s))  Protime-INR     Status: Abnormal   Collection Time: 01/07/17  5:33 AM  Result Value Ref Range   Prothrombin Time 27.7 (H) 11.4 - 15.2 seconds   INR 4.32   Basic metabolic panel     Status: Abnormal   Collection Time: 01/07/17  5:33 AM  Result Value Ref Range   Sodium 136 135 - 145 mmol/L   Potassium 4.1 3.5 - 5.1 mmol/L   Chloride 102 101 - 111 mmol/L   CO2 25 22 - 32 mmol/L   Glucose, Bld 200 (H) 65 - 99 mg/dL   BUN 67 (H) 6 - 20 mg/dL  Creatinine, Ser 3.18 (H) 0.61 - 1.24 mg/dL   Calcium 8.5 (L) 8.9 - 10.3 mg/dL   GFR calc non Af Amer 17 (L) >60 mL/min   GFR calc Af Amer 20 (L) >60 mL/min    Comment: (NOTE) The eGFR has been calculated using the CKD EPI equation. This calculation has not been validated in all clinical situations. eGFR's persistently <60 mL/min signify possible Chronic Kidney Disease.    Anion gap 9 5 - 15  CBC     Status: Abnormal   Collection Time: 01/07/17  5:33 AM  Result Value Ref Range   WBC 14.4 (H) 4.0 - 10.5 K/uL   RBC 3.52 (L) 4.22 - 5.81 MIL/uL   Hemoglobin 12.1 (L) 13.0 - 17.0 g/dL   HCT 34.2 (L) 39.0 - 52.0 %   MCV 97.2 78.0 - 100.0 fL   MCH 34.4 (H) 26.0 - 34.0 pg   MCHC 35.4 30.0  - 36.0 g/dL   RDW 13.8 11.5 - 15.5 %   Platelets 161 150 - 400 K/uL  Protime-INR     Status: Abnormal   Collection Time: 01/08/17  5:05 AM  Result Value Ref Range   Prothrombin Time 32.6 (H) 11.4 - 15.2 seconds   INR 1.28   Basic metabolic panel     Status: Abnormal   Collection Time: 01/08/17  5:05 AM  Result Value Ref Range   Sodium 135 135 - 145 mmol/L   Potassium 4.2 3.5 - 5.1 mmol/L   Chloride 102 101 - 111 mmol/L   CO2 23 22 - 32 mmol/L   Glucose, Bld 213 (H) 65 - 99 mg/dL   BUN 81 (H) 6 - 20 mg/dL   Creatinine, Ser 3.71 (H) 0.61 - 1.24 mg/dL   Calcium 8.7 (L) 8.9 - 10.3 mg/dL   GFR calc non Af Amer 14 (L) >60 mL/min   GFR calc Af Amer 17 (L) >60 mL/min    Comment: (NOTE) The eGFR has been calculated using the CKD EPI equation. This calculation has not been validated in all clinical situations. eGFR's persistently <60 mL/min signify possible Chronic Kidney Disease.    Anion gap 10 5 - 15  CBC     Status: Abnormal   Collection Time: 01/08/17  5:05 AM  Result Value Ref Range   WBC 12.1 (H) 4.0 - 10.5 K/uL   RBC 3.61 (L) 4.22 - 5.81 MIL/uL   Hemoglobin 12.3 (L) 13.0 - 17.0 g/dL   HCT 35.2 (L) 39.0 - 52.0 %   MCV 97.5 78.0 - 100.0 fL   MCH 34.1 (H) 26.0 - 34.0 pg   MCHC 34.9 30.0 - 36.0 g/dL   RDW 13.9 11.5 - 15.5 %   Platelets 163 150 - 400 K/uL    No results found.  Review of Systems  Constitutional: Positive for chills.  HENT: Positive for congestion.   Respiratory: Positive for cough, sputum production and shortness of breath.   Cardiovascular: Negative for orthopnea.  Gastrointestinal: Negative for nausea and vomiting.  Neurological: Positive for weakness.   Blood pressure 103/61, pulse (!) 104, temperature 98 F (36.7 C), temperature source Oral, resp. rate 17, height _0  (1.753 m), weight 77 kg (169 lb 12.1 oz), SpO2 98 %. Physical Exam  Constitutional: No distress.  Eyes: No scleral icterus.  Neck: No JVD present.  Cardiovascular:  Iregular rate  and rhythm  Respiratory: No respiratory distress. He has wheezes.  GI: He exhibits no distension. There is no tenderness.  Musculoskeletal: He  exhibits no edema.    Assessment/Plan: Problem #1 acute kidney injury superimposed on chronic. Presently his creatinine has been increasing. This could be secondary to prerenal syndrome/ATN/cardiorenal/natural progression of his chronic renal failure. Presently patient doesn't have any uremic signs and symptoms. Problem #2 chronic renal failure: His creatinine was 2.18 with EGFR of 28% on 01/05/16                                                       His creatinine was 1.89 on 09/16/15                                                              Creatinine 1.33 on 04/28/13                                                              Creatinine 1.84 on 06/26/2007. Hence patient seems to be stage IV chronic renal failure. The etiology was thought to be secondary to hypertension/cardiorenal/obstructive uropathy Problem #3 history of prostatic cancer Problem #4 hypertension: Presently patient was low normal blood pressure. Occasionally hypotensive. Problem #5 history of atrial fibrillation: Presently he is on Cardizem and Coumadin.[name] His heart rate has improved Problem #6 proteinuria: Non-nephrotic range Problem #7 history of cardiomyopathy: Ejection fraction was 45%. Presently patient doesn't have any sign of fluid overload Problem #8 history of cough and difficulty breathing. Possibly a combination of exacerbation of COPD and influenza. Presently patient is feeling better. Problem #9 metabolic bone disease: Calcium is range but his PTH was high. Plan: 1] We'll check urine sodium and creatinine           2] We'll start hydrating patient with half-normal saline at 75 mL per hour           3) Would consider using diuretics if his urine output doesn't improve.          4) We'll check also ultrasound of the kidneys to ensure patient doesn't have obstructive uropathy  since she has history of prostate cancer.           5] we'll check his renal panel in the morning Tangie Stay S 01/08/2017, 2:44 PM

## 2017-01-08 NOTE — Care Management Note (Addendum)
Case Management Note  Patient Details  Name: Don [name] MRN: [id number] Date of Birth: Nov 19, 1940  Subjective/Objective: Patient adm from home alone with COPD exacerbation/+flu. He reports ind with ADL's but reports some weakness. He would like a Home health nurse. Offered choice of [place] agencies. He reports no DME or HH PTA. He does not have oxygen at home, currently on oxygen.                    Action/Plan: Romualdo Bolk notified for [facility] RN and will obtain orders from chart. Patient aware that [facility] will start 48 hours post discharge. CM will follow for other needs.   Later 01/18/2017: Patient discharging today. Will have [place] RN, PT, OT, aide, and SW provided by [facility]. Patient will most likely need oxygen, RN to assess. Patient will need INR checked tomorrow. Romualdo Bolk of [facility] notified.   Expected Discharge Date:       01/09/2017           Expected Discharge Plan:  [place]  In-House Referral:     Discharge planning Services  CM Consult  Post Acute Care Choice:  Home Health Choice offered to:  Patient  DME Arranged:    DME Agency:     HH Arranged:  RN [place] Agency:  [facility]  Status of Service:  In process, will continue to follow  If discussed at Long Length of Stay Meetings, dates discussed:    Additional Comments:  Mariamawit Depaoli, Chauncey Reading, RN 01/08/2017, 1:21 PM

## 2017-01-08 NOTE — Progress Notes (Signed)
Inpatient Diabetes Program Recommendations  AACE/ADA: New Consensus Statement on Inpatient Glycemic Control (2015)  Target Ranges:  Prepandial:   less than 140 mg/dL      Peak postprandial:   less than 180 mg/dL (1-2 hours)      Critically ill patients:  140 - 180 mg/dL   Lab Results  Component Value Date   GLUCAP 195 (H) 04/04/2014   HGBA1C 6.1 06/03/2015    Review of Glycemic Control Results for Don [name], Don [name] (MRN [id number]) as of 01/08/2017 10:36  Ref. Range 01/05/2017 08:46 01/06/2017 04:30 01/07/2017 05:33 01/08/2017 05:05  Glucose Latest Ref Range: 65 - 99 mg/dL 110 (H) 187 (H) 200 (H) 213 (H)   Diabetes history: Prediabetes Outpatient Diabetes medications: None Current orders for Inpatient glycemic control: None  Inpatient Diabetes Program Recommendations:  While patient on steroids please consider: -Novolog correction 0-9 units tid + 0-5 units hs -A1c to determine current prehospital glycemic control  Thank you, Bethena Roys E. Katanya Schlie, RN, MSN, CDE Inpatient Glycemic Control Team Team Pager [phone] (8am-5pm) 01/08/2017 10:37 AM

## 2017-01-08 NOTE — Consult Note (Signed)
CARDIOLOGY CONSULT NOTE   Patient ID: Don [name] MRN: [id number] DOB/AGE: January 31, 1941 76 y.o.  Admit Date: 01/05/2017 Referring Physician: Gwynneth Albright MD Primary Physician: Wardell Honour, MD Consulting Cardiologist: Rozann Lesches MD Primary Cardiologist: Minus Breeding, MD Reason for Consultation: Atrial fibrillation with RVR  Clinical Summary Don [name] is a 76 y.o.male with with past medical history outlined below including chronic atrial fibrillation on Coumadin, previously documented cardiomyopathy with LVEF 45%, COPD with tobacco abuse, and CKD stage III. He was seen last by Dr. Percival Spanish on 09/23/2014 and was scheduled for a Lexiscan Myoview on 10/23/2014 which found no evidence of ischemia    He is currently admitted to the hospital with COPD exacerbation and influenza. Heart rate has been elevated in atrial fibrillation, he was also taken off beta blocker due to wheezing. With combination of intravenous and oral Cardizem, heart rate remains elevated. We were asked to assist with his management.  His recent follow-up echocardiogram shows normal LVEF at 60-65% at this point. Minimal increase in troponin I not consistent with ACS. Chest x-ray shows no infiltrate.  Allergies  Allergen Reactions  . Wellbutrin [Bupropion] Anxiety    Medications Scheduled Medications: . atorvastatin  20 mg Oral q1800  . diltiazem  90 mg Oral Q6H  . DULoxetine  60 mg Oral Daily  . guaiFENesin  1,200 mg Oral BID  . ipratropium  0.5 mg Nebulization Q4H WA  . iron polysaccharides  150 mg Oral Daily  . levalbuterol  0.63 mg Nebulization Q4H WA  . levofloxacin  750 mg Oral Q48H  . methylPREDNISolone (SOLU-MEDROL) injection  60 mg Intravenous Q6H  . mometasone-formoterol  2 puff Inhalation BID  . omega-3 acid ethyl esters  2 g Oral BID  . oseltamivir  30 mg Oral Q2000  . pantoprazole  40 mg Oral Daily  . polyethylene glycol  17 g Oral Daily  . terazosin  2 mg Oral QHS  . Warfarin -  Pharmacist Dosing Inpatient   Does not apply Q24H     Infusions: . diltiazem (CARDIZEM) infusion 10 mg/hr (01/08/17 0600)     PRN Medications:  acetaminophen **OR** acetaminophen, clonazePAM, levalbuterol, nitroGLYCERIN, ondansetron **OR** ondansetron (ZOFRAN) IV, polyvinyl alcohol   Past Medical History:  Diagnosis Date  . Acute bronchitis 04/03/2014  . Anxiety   . Atrial fibrillation with RVR ([place]) 04/03/2014  . Cataract   . COPD (chronic obstructive pulmonary disease) ([place])   . Hyperlipidemia   . Hypertension   . Macular degeneration   . Noncompliance with medications 04/2013   xarelto, digoxin  . Pre-diabetes   . Prostate cancer ([place])   . Stage III chronic kidney disease 04/03/2014  . Tobacco abuse    ongoing  . Tubular adenoma of colon 07/31/02, 11/18/03    Past Surgical History:  Procedure Laterality Date  . Anal abcess,Hemorroids,    . COLONOSCOPY N/A 04/05/2014   Procedure: COLONOSCOPY;  Surgeon: Ladene Artist, MD;  Location: [facility];  Service: Endoscopy;  Laterality: N/A;  . COLONOSCOPY W/ BIOPSIES  11/18/2003   Dr. Earle Gell  . ESOPHAGOGASTRODUODENOSCOPY  11/18/2003   Dr. Earle Gell  . ESOPHAGOGASTRODUODENOSCOPY N/A 04/05/2014   Procedure: ESOPHAGOGASTRODUODENOSCOPY (EGD);  Surgeon: Ladene Artist, MD;  Location: [facility];  Service: Endoscopy;  Laterality: N/A;  . RETROPUBIC PROSTATECTOMY  11/26/2001  . TONSILLECTOMY      Family History  Problem Relation Age of Onset  . Diabetes Father   . Heart disease Father     MI  .  Heart attack Father   . Heart attack Mother   . Heart disease Brother   . Cancer Brother     lung  . Lung cancer Brother   . Heart disease Brother   . Lung cancer Sister   . Cancer Sister     lung  . Heart disease Brother   . Heart disease Brother     Social History Don [name] reports that he has been smoking Cigarettes and Cigars.  He has a 62.00 pack-year smoking history. He has never used smokeless  tobacco. Don [name] reports that he does not drink alcohol.  Review of Systems Complete review of systems are found to be negative unless outlined in H&P above. Feels weak, appetite fair.  Physical Examination Blood pressure 107/71, pulse (!) 104, temperature 97.9 F (36.6 C), temperature source Oral, resp. rate (!) 32, height '5\' 9"'$  (1.753 m), weight 169 lb 12.1 oz (77 kg), SpO2 96 %.  Intake/Output Summary (Last 24 hours) at 01/08/17 1054 Last data filed at 01/08/17 0847  Gross per 24 hour  Intake              370 ml  Output             1300 ml  Net             -930 ml    Telemetry: Atrial fibrillation with heart rate 100 110.  GEN: Elderly male in no distress HEENT: Conjunctiva and lids normal, oropharynx clear. Neck: Supple, no elevated JVP or carotid bruits, no thyromegaly. Lungs: Diffuse expiratory wheezing and scattered rhonchi, labored breathing at rest. Cardiac: Irregularly irregular, no S3 or significant systolic murmur, no pericardial rub. Abdomen: Soft, nontender, bowel sounds present. Extremities: Mild ankle edema, distal pulses 2+. Skin: Warm and dry. Musculoskeletal: No kyphosis. Neuropsychiatric: Alert and oriented x3, affect grossly appropriate.  Prior Cardiac Testing/Procedures Echocardiogram 01/05/2017 Left ventricle: The cavity size was normal. Systolic function was   normal. The estimated ejection fraction was in the range of 60%   to 65%. Wall motion was normal; there were no regional wall   motion abnormalities. The study was not technically sufficient to   allow evaluation of LV diastolic dysfunction due to atrial   fibrillation. Mild concentric and moderate focal basal septal   hypertrophy. - Aortic valve: Trileaflet; mildly calcified leaflets. There was no   stenosis. - Mitral valve: Moderately calcified annulus. - Left atrium: The atrium was mildly dilated.  Lab Results  Basic Metabolic Panel:  Recent Labs Lab 01/05/17 0846 01/06/17 0430  01/07/17 0533 01/08/17 0505  NA 135 136 136 135  K 4.8 4.1 4.1 4.2  CL 98* 102 102 102  CO2 '28 24 25 23  '$ GLUCOSE 110* 187* 200* 213*  BUN 48* 56* 67* 81*  CREATININE 3.07* 3.16* 3.18* 3.71*  CALCIUM 8.9 8.6* 8.5* 8.7*    Liver Function Tests:  Recent Labs Lab 01/05/17 0846  AST 47*  ALT 18  ALKPHOS 56  BILITOT 1.0  PROT 6.3*  ALBUMIN 3.2*    CBC:  Recent Labs Lab 01/05/17 0846 01/06/17 0430 01/07/17 0533 01/08/17 0505  WBC 5.9 9.5 14.4* 12.1*  NEUTROABS 4.8  --   --   --   HGB 14.0 11.8* 12.1* 12.3*  HCT 40.9 33.0* 34.2* 35.2*  MCV 99.0 97.1 97.2 97.5  PLT 152 150 161 163    Cardiac Enzymes:  Recent Labs Lab 01/05/17 0846 01/05/17 1434 01/05/17 1933 01/06/17 0119  TROPONINI 0.03* 0.03* 0.04* 0.03*  BNP: 341  Radiology: Chest x-ray 01/05/2017: FINDINGS: Normal cardiac silhouette. No effusion, infiltrate, or pneumothorax. Patient rotated rightward. No acute osseous abnormality.  IMPRESSION: No acute cardiopulmonary process.  ECG: I personally reviewed the tracing from 01/05/2017 which showed atrial fibrillation with RVR, anteroseptal Q waves, nonspecific ST-T changes.  Impression  1. Chronic atrial fibrillation, presently with elevated heart rates in the setting of COPD exacerbation and influenza with respiratory distress. No active chest pain, troponin I levels do not suggest ACS. CHADSVASC score 4-5, he is on Coumadin for stroke prophylaxis, INR 3.1. As an outpatient he was on combination of Lopressor 25 mg twice daily and Cardizem CD 300 mg daily. Now requiring supplemental IV Cardizem for heart rate control.  2. COPD exacerbation, currently on steroids, bronchodilators, and antibiotics. Continues to have significant wheezing.  3. Influenza A, on Tamiflu. Afebrile.  4. CKD stage III, acute renal insufficiency with creatinine up to 3.7. Lasix has been held. Nephrology consultation also pending.  5. History of cardiomyopathy, LVEF 60-65%  by follow-up echocardiogram done during this hospital stay.  Recommendations  Reviewed records, discussed with patient and Dr. Roderic Palau. Expect heart rate will be difficult to control in the acute setting until his pulmonary status improves. With active wheezing, agree with holding beta blocker, but this is probably also contributing to the difficulty with his heart rate control. Bisoprolol might be an option as a more beta-selective agent, however for now I would continue oral Cardizem, supplement with IV Cardizem as needed. Heart rate on my examination was around 100 bpm. Hold off on amiodarone in light of pulmonary status, also no digoxin with degree of renal insufficiency. Otherwise continue Coumadin for stroke prophylaxis.   Signed: Satira Sark, M.D., F.A.C.C.

## 2017-01-08 NOTE — Progress Notes (Signed)
Called Dr. Florentina Addison office inform him of consult for patient per Dr. Roderic Palau. Receptionist stated that she would let him know.

## 2017-01-08 NOTE — Plan of Care (Signed)
Problem: Nutrition: Goal: Adequate nutrition will be maintained Outcome: Progressing Patient eating all of his meals with no issues. Understands that he needs to eat well to feel better.

## 2017-01-08 NOTE — Progress Notes (Signed)
Have allowed patient to sleep tonight without being awakened for nebulizer treatments he appears to have done well. Also he has stayed off of his oxygen since fir rounds - Saturation 90-92 while sleeping.

## 2017-01-08 NOTE — Progress Notes (Signed)
PHARMACY NOTE:  ANTIMICROBIAL RENAL DOSAGE ADJUSTMENT  Current antimicrobial regimen includes a mismatch between antimicrobial dosage and estimated renal function.  As per policy approved by the Pharmacy & Therapeutics and Medical Executive Committees, the antimicrobial dosage will be adjusted accordingly.  Current antimicrobial dosage:  Levaquin '750mg'$  po q48hrs  Indication: COPDE  Renal Function:  Estimated Creatinine Clearance: 16.7 mL/min (by C-G formula based on SCr of 3.71 mg/dL (H)). '[]'$      On intermittent HD, scheduled: '[]'$      On CRRT    Antimicrobial dosage has been changed to:  Levaquin '500mg'$  PO q48hrs (for clcr < 20)  Additional comments:  Monitor SCR, progress  Thank you for allowing pharmacy to be a part of this patient's care.  Ena Dawley, [facility] 01/08/2017 1:46 PM

## 2017-01-09 DIAGNOSIS — R748 Abnormal levels of other serum enzymes: Secondary | ICD-10-CM

## 2017-01-09 LAB — RENAL FUNCTION PANEL
ALBUMIN: 2.6 g/dL — AB (ref 3.5–5.0)
Anion gap: 10 (ref 5–15)
BUN: 88 mg/dL — AB (ref 6–20)
CHLORIDE: 100 mmol/L — AB (ref 101–111)
CO2: 23 mmol/L (ref 22–32)
CREATININE: 3.9 mg/dL — AB (ref 0.61–1.24)
Calcium: 8.3 mg/dL — ABNORMAL LOW (ref 8.9–10.3)
GFR calc Af Amer: 16 mL/min — ABNORMAL LOW (ref >60)
GFR, EST NON AFRICAN AMERICAN: 14 mL/min — AB (ref >60)
GLUCOSE: 235 mg/dL — AB (ref 65–99)
POTASSIUM: 4.4 mmol/L (ref 3.5–5.1)
Phosphorus: 4.4 mg/dL (ref 2.5–4.6)
Sodium: 133 mmol/L — ABNORMAL LOW (ref 135–145)

## 2017-01-09 LAB — PROTIME-INR
INR: 3.25
PROTHROMBIN TIME: 33.9 s — AB (ref 11.4–15.2)

## 2017-01-09 LAB — GLUCOSE, CAPILLARY
GLUCOSE-CAPILLARY: 357 mg/dL — AB (ref 65–99)
GLUCOSE-CAPILLARY: 189 mg/dL — AB (ref 65–99)
Glucose-Capillary: 140 mg/dL — ABNORMAL HIGH (ref 65–99)

## 2017-01-09 MED ORDER — BISOPROLOL FUMARATE 5 MG PO TABS
2.5000 mg | ORAL_TABLET | Freq: Two times a day (BID) | ORAL | Status: DC
  Administered 2017-01-09 – 2017-01-10 (×4): 2.5 mg via ORAL
  Filled 2017-01-09 (×8): qty 0.5

## 2017-01-09 MED ORDER — INSULIN ASPART 100 UNIT/ML ~~LOC~~ SOLN
0.0000 [IU] | Freq: Three times a day (TID) | SUBCUTANEOUS | Status: DC
  Administered 2017-01-09: 15 [IU] via SUBCUTANEOUS
  Administered 2017-01-09: 2 [IU] via SUBCUTANEOUS
  Administered 2017-01-10: 3 [IU] via SUBCUTANEOUS
  Administered 2017-01-10: 8 [IU] via SUBCUTANEOUS
  Administered 2017-01-10 – 2017-01-11 (×2): 3 [IU] via SUBCUTANEOUS
  Administered 2017-01-11 – 2017-01-12 (×3): 5 [IU] via SUBCUTANEOUS
  Administered 2017-01-12: 3 [IU] via SUBCUTANEOUS
  Administered 2017-01-12: 5 [IU] via SUBCUTANEOUS
  Administered 2017-01-13: 3 [IU] via SUBCUTANEOUS
  Administered 2017-01-13: 5 [IU] via SUBCUTANEOUS
  Administered 2017-01-13 – 2017-01-14 (×4): 3 [IU] via SUBCUTANEOUS
  Administered 2017-01-15: 2 [IU] via SUBCUTANEOUS
  Administered 2017-01-15 (×2): 8 [IU] via SUBCUTANEOUS
  Administered 2017-01-16: 11 [IU] via SUBCUTANEOUS
  Administered 2017-01-16 (×2): 5 [IU] via SUBCUTANEOUS
  Administered 2017-01-17: 8 [IU] via SUBCUTANEOUS
  Administered 2017-01-17: 5 [IU] via SUBCUTANEOUS
  Administered 2017-01-17: 8 [IU] via SUBCUTANEOUS
  Administered 2017-01-18 (×2): 5 [IU] via SUBCUTANEOUS
  Administered 2017-01-18: 15 [IU] via SUBCUTANEOUS

## 2017-01-09 MED ORDER — SODIUM CHLORIDE 0.9 % IV SOLN
INTRAVENOUS | Status: DC
  Administered 2017-01-09 – 2017-01-13 (×10): via INTRAVENOUS

## 2017-01-09 MED ORDER — LEVALBUTEROL HCL 0.63 MG/3ML IN NEBU
0.6300 mg | INHALATION_SOLUTION | Freq: Four times a day (QID) | RESPIRATORY_TRACT | Status: DC
  Administered 2017-01-10 – 2017-01-18 (×33): 0.63 mg via RESPIRATORY_TRACT
  Filled 2017-01-09 (×34): qty 3

## 2017-01-09 MED ORDER — FUROSEMIDE 10 MG/ML IJ SOLN
40.0000 mg | Freq: Two times a day (BID) | INTRAMUSCULAR | Status: DC
  Administered 2017-01-09 – 2017-01-10 (×4): 40 mg via INTRAVENOUS
  Filled 2017-01-09 (×4): qty 4

## 2017-01-09 MED ORDER — IPRATROPIUM BROMIDE 0.02 % IN SOLN
0.5000 mg | Freq: Four times a day (QID) | RESPIRATORY_TRACT | Status: DC
  Administered 2017-01-10 – 2017-01-18 (×33): 0.5 mg via RESPIRATORY_TRACT
  Filled 2017-01-09 (×34): qty 2.5

## 2017-01-09 MED ORDER — INSULIN ASPART 100 UNIT/ML ~~LOC~~ SOLN
0.0000 [IU] | Freq: Every day | SUBCUTANEOUS | Status: DC
  Administered 2017-01-11 – 2017-01-17 (×6): 2 [IU] via SUBCUTANEOUS

## 2017-01-09 NOTE — Progress Notes (Signed)
Subjective: Interval History: has complaints of occasional cough with some sputum production. His breathing is more or less the same and there is no significant change. He was able to sleep last night..  Objective: Vital signs in last 24 hours: Temp:  [97.5 F (36.4 C)-98 F (36.7 C)] 98 F (36.7 C) (02/27 0719) Pulse Rate:  [92-123] 114 (02/27 0719) Resp:  [10-32] 24 (02/27 0719) BP: (68-131)/(38-83) 125/81 (02/27 0600) SpO2:  [91 %-100 %] 100 % (02/27 0748) Weight:  [79.9 kg (176 lb 2.4 oz)] 79.9 kg (176 lb 2.4 oz) (02/27 0500) Weight change: 2.9 kg (6 lb 6.3 oz)  Intake/Output from previous day: 02/26 0701 - 02/27 0700 In: 2544.3 [P.O.:1260; I.V.:1284.3] Out: 900 [Urine:900] Intake/Output this shift: No intake/output data recorded.  General appearance: alert, cooperative and no distress Resp: diminished breath sounds bilaterally and wheezes bilaterally Cardio: regular rate and rhythm Extremities: No edema  Lab Results:  Recent Labs  01/07/17 0533 01/08/17 0505  WBC 14.4* 12.1*  HGB 12.1* 12.3*  HCT 34.2* 35.2*  PLT 161 163   BMET:  Recent Labs  01/08/17 0505 01/09/17 0401  NA 135 133*  K 4.2 4.4  CL 102 100*  CO2 23 23  GLUCOSE 213* 235*  BUN 81* 88*  CREATININE 3.71* 3.90*  CALCIUM 8.7* 8.3*   No results for input(s): PTH in the last 72 hours. Iron Studies: No results for input(s): IRON, TIBC, TRANSFERRIN, FERRITIN in the last 72 hours.  Studies/Results: No results found.  I have reviewed the patient's current medications.  Assessment/Plan: Problem #1 acute kidney injury superimposed on chronic. Presently his pending creatinine is continuously increasing. Possibly secondary to ATN versus cardiorenal. Patient presently is asymptomatic. Problem #2 chronic renal failure: Possibly early stage IV versus late stage III. Problem #3 difficulty breathing: Possibly a combination of influenza and exacerbation of COPD. Presently patient is nonoliguric. He  claims he is feeling better since she came to the hospital. Problem #4 metabolic bone disease: His calcium and phosphorus is range. Problem #5 hypertension: His blood pressure is reasonably controlled Problem #6 history of a trial fibrillation: Patient on Coumadin. His heart rate seems to be better controlled. Problem #7 hyponatremia Plan: 1] We'll DC half normal saline 2] will start patient on normal saline at 125 mL/h 3] will start patient on Lasix 40 mg IV twice a day 4] will check his renal panel in the morning.    LOS: 4 days   Lashannon Bresnan S 01/09/2017,8:03 AM

## 2017-01-09 NOTE — Progress Notes (Signed)
[place] for Coumadin (chronic Rx PTA) Indication: atrial fibrillation  Allergies  Allergen Reactions  . Wellbutrin [Bupropion] Anxiety    Patient Measurements: Height: '5\' 9"'$  (175.3 cm) Weight: 176 lb 2.4 oz (79.9 kg) IBW/kg (Calculated) : 70.7  Vital Signs: Temp: 98 F (36.7 C) (02/27 0719) Temp Source: Oral (02/27 0719) BP: 113/61 (02/27 0800) Pulse Rate: 111 (02/27 0800)  Labs:  Recent Labs  01/07/17 0533 01/08/17 0505 01/09/17 0401  HGB 12.1* 12.3*  --   HCT 34.2* 35.2*  --   PLT 161 163  --   LABPROT 27.7* 32.6* 33.9*  INR 2.53 3.10 3.25  CREATININE 3.18* 3.71* 3.90*    Estimated Creatinine Clearance: 15.9 mL/min (by C-G formula based on SCr of 3.9 mg/dL (H)).   Medical History: Past Medical History:  Diagnosis Date  . Acute bronchitis 04/03/2014  . Anxiety   . Atrial fibrillation ([place])   . Cataract   . COPD (chronic obstructive pulmonary disease) ([place])   . Essential hypertension   . Hyperlipidemia   . Macular degeneration   . Noncompliance with medications 04/2013   Xarelto, digoxin previously  . Pre-diabetes   . Prostate cancer ([place])   . Stage III chronic kidney disease 04/03/2014  . Tubular adenoma of colon 07/31/02, 11/18/03    Medications:  Prescriptions Prior to Admission  Medication Sig Dispense Refill Last Dose  . albuterol (PROVENTIL HFA;VENTOLIN HFA) 108 (90 BASE) MCG/ACT inhaler Inhale 2 puffs into the lungs every 6 (six) hours as needed for wheezing or shortness of breath. 1 Inhaler 2 Past Week at Unknown time  . ALLERGY RELIEF 10 MG tablet TAKE 1 TABLET DAILY FOR ALLERGY 30 tablet 5 Past Week at Unknown time  . atorvastatin (LIPITOR) 20 MG tablet TAKE 1 TABLET DAILY 30 tablet 2 Past Week at Unknown time  . budesonide-formoterol (SYMBICORT) 160-4.5 MCG/ACT inhaler Inhale 2 puffs into the lungs 2 (two) times daily. 1 Inhaler 3 Past Week at Unknown time  . clonazePAM (KLONOPIN) 1 MG tablet TAKE 1/2  TABLET ONCE DAILY AS NEEDED FOR ANXIETY 15 tablet 0 Past Week at Unknown time  . diltiazem (CARDIZEM CD) 300 MG 24 hr capsule TAKE (1) CAPSULE DAILY 30 capsule 1 Past Week at Unknown time  . doxycycline (ADOXA) 100 MG tablet Take 1 tablet (100 mg total) by mouth 2 (two) times daily. 20 tablet 0 Past Week at Unknown time  . doxycycline (VIBRA-TABS) 100 MG tablet Take 1 tablet by mouth 2 (two) times daily.   Past Week at Unknown time  . doxylamine, Sleep, (UNISOM) 25 MG tablet Take 25 mg by mouth at bedtime as needed.   Past Week at Unknown time  . DULoxetine (CYMBALTA) 60 MG capsule TAKE (1) CAPSULE DAILY 30 capsule 1 Past Week at Unknown time  . FERREX 150 150 MG capsule Take 1 capsule by mouth daily.   Past Week at Unknown time  . furosemide (LASIX) 40 MG tablet Take 1 tablet by mouth every other day.    Past Week at Unknown time  . metoprolol tartrate (LOPRESSOR) 25 MG tablet TAKE (1) TABLET TWICE A DAY. 60 tablet 4 Past Week at Unknown time  . Multiple Vitamin (MULTIVITAMIN WITH MINERALS) TABS Take 1 tablet by mouth daily.   Past Week at Unknown time  . nitroGLYCERIN (NITROSTAT) 0.4 MG SL tablet Place 1 tablet (0.4 mg total) under the tongue every 5 (five) minutes as needed for chest pain. 25 tablet 2 Past Week  at Unknown time  . omega-3 acid ethyl esters (LOVAZA) 1 G capsule TAKE (2) CAPSULES TWICE DAILY. 120 capsule 2 Past Week at Unknown time  . pantoprazole (PROTONIX) 40 MG tablet TAKE 1 TABLET DAILY 30 tablet 2 Past Week at Unknown time  . Polyethyl Glycol-Propyl Glycol (SYSTANE OP) Place 1 drop into both eyes 2 (two) times daily as needed (dry eyes).    Past Week at Unknown time  . Probiotic Product (PRO-BIOTIC BLEND) CAPS Take 1 capsule by mouth daily.   Past Week at Unknown time  . terazosin (HYTRIN) 2 MG capsule TAKE 1 CAPSULE AT BEDTIME 30 capsule 4 Past Week at Unknown time  . tiotropium (SPIRIVA) 18 MCG inhalation capsule Place 1 capsule (18 mcg total) into inhaler and inhale daily. 30  capsule 3 Past Week at Unknown time  . triamcinolone cream (KENALOG) 0.1 % Apply 1 application topically 2 (two) times daily. 80 g 0 Past Week at Unknown time  . warfarin (COUMADIN) 4 MG tablet Take 1 and 1/2 tablets (= '6mg'$ ) daily except on Sundays and Thursdays take 1 tablet (='4mg'$ ) 45 tablet 0 Past Week at Unknown time  . Incontinence Supply Disposable (INCONTINENCE BRIEF LARGE) MISC Use as needed for urinary incontinence.  Dx: urinary incontinence R32 and prostate cancer C61 200 each 1 Taking   Assessment: 76yo male on chronic Coumadin PTA.  INR is SUPRAtherapeutic.  Pt on chronic Coumadin for afib.  Goal of Therapy:  INR 2-3   Plan:  HOLD coumadin today, allow INR to trend down INR daily  Pricilla Larsson 01/09/2017,11:28 AM

## 2017-01-09 NOTE — Progress Notes (Signed)
Inpatient Diabetes Program Recommendations  AACE/ADA: New Consensus Statement on Inpatient Glycemic Control (2015)  Target Ranges:  Prepandial:   less than 140 mg/dL      Peak postprandial:   less than 180 mg/dL (1-2 hours)      Critically ill patients:  140 - 180 mg/dL  Results for Don [name], Don [name] (MRN [id number]) as of 01/09/2017 09:06  Ref. Range 01/05/2017 08:46 01/06/2017 04:30 01/07/2017 05:33 01/08/2017 05:05 01/09/2017 04:01  Glucose Latest Ref Range: 65 - 99 mg/dL 110 (H) 187 (H) 200 (H) 213 (H) 235 (H)   Review of Glycemic Control  Diabetes history: Prediabetes Outpatient Diabetes medications: NA Current orders for Inpatient glycemic control: None  Inpatient Diabetes Program Recommendations: Correction (SSI): While inpatient and ordered steorids, please consider ordering CBGs with Novolog correction scale ACHS. HgbA1C: Please consider ordering an A1C to evaluate glycemic control over the past 2-3 months.  Thanks, Barnie Alderman, RN, MSN, CDE Diabetes Coordinator Inpatient Diabetes Program [phone] (Team Pager from 8am to 5pm)

## 2017-01-09 NOTE — Progress Notes (Addendum)
Dr Domenic Polite updated on pt's HR 80's. Cardizem drip to be stopped . Pt continue PO cardizem.

## 2017-01-09 NOTE — Progress Notes (Signed)
Progress Note  Patient Name: Don [name] Date of Encounter: 01/09/2017  Primary Cardiologist: Minus Breeding, MD  Subjective   "I don't feel any better." No chest pain or palpitations.   Inpatient Medications    Scheduled Meds: . atorvastatin  20 mg Oral q1800  . diltiazem  90 mg Oral Q6H  . DULoxetine  60 mg Oral Daily  . guaiFENesin  1,200 mg Oral BID  . ipratropium  0.5 mg Nebulization Q4H WA  . iron polysaccharides  150 mg Oral Daily  . levalbuterol  0.63 mg Nebulization Q4H WA  . levofloxacin  500 mg Oral Q48H  . methylPREDNISolone (SOLU-MEDROL) injection  60 mg Intravenous Q6H  . mometasone-formoterol  2 puff Inhalation BID  . omega-3 acid ethyl esters  2 g Oral BID  . oseltamivir  30 mg Oral Q2000  . pantoprazole  40 mg Oral Daily  . polyethylene glycol  17 g Oral Daily  . terazosin  2 mg Oral QHS  . Warfarin - Pharmacist Dosing Inpatient   Does not apply Q24H   Continuous Infusions: . sodium chloride 75 mL/hr at 01/09/17 0517  . diltiazem (CARDIZEM) infusion 10 mg/hr (01/09/17 0350)   PRN Meds: acetaminophen **OR** acetaminophen, clonazePAM, levalbuterol, nitroGLYCERIN, ondansetron **OR** ondansetron (ZOFRAN) IV, polyvinyl alcohol   Vital Signs    Vitals:   01/09/17 0400 01/09/17 0421 01/09/17 0500 01/09/17 0600  BP: 120/66  121/83 125/81  Pulse: (!) 109  97 (!) 111  Resp: 13  20 (!) 24  Temp: 97.7 F (36.5 C)     TempSrc: Oral     SpO2: 97% 93% 96% 94%  Weight:   176 lb 2.4 oz (79.9 kg)   Height:        Intake/Output Summary (Last 24 hours) at 01/09/17 0715 Last data filed at 01/09/17 0517  Gross per 24 hour  Intake          2544.25 ml  Output              900 ml  Net          1644.25 ml   Filed Weights   01/07/17 0500 01/08/17 0400 01/09/17 0500  Weight: 168 lb 3.4 oz (76.3 kg) 169 lb 12.1 oz (77 kg) 176 lb 2.4 oz (79.9 kg)    Telemetry    I personally reviewed telemetry which shows atrial fibrillation.  Physical Exam   GEN: No  acute distress. Ill appearing.  Neck: No JVD Cardiac: IRRR, heart sounds are obscured by lung sounds, no murmurs, rubs, or gallops.  Respiratory: Forced expiratory wheezes and upper airway sounds. Rhonchi at the bases. GI: Soft, nontender, non-distended  MS: No edema; No deformity.   Labs    Chemistry Recent Labs Lab 01/05/17 0846  01/07/17 0533 01/08/17 0505 01/09/17 0401  NA 135  < > 136 135 133*  K 4.8  < > 4.1 4.2 4.4  CL 98*  < > 102 102 100*  CO2 28  < > '25 23 23  '$ GLUCOSE 110*  < > 200* 213* 235*  BUN 48*  < > 67* 81* 88*  CREATININE 3.07*  < > 3.18* 3.71* 3.90*  CALCIUM 8.9  < > 8.5* 8.7* 8.3*  PROT 6.3*  --   --   --   --   ALBUMIN 3.2*  --   --   --  2.6*  AST 47*  --   --   --   --   ALT 18  --   --   --   --  ALKPHOS 56  --   --   --   --   BILITOT 1.0  --   --   --   --   GFRNONAA 18*  < > 17* 14* 14*  GFRAA 21*  < > 20* 17* 16*  ANIONGAP 9  < > '9 10 10  '$ < > = values in this interval not displayed.   Hematology Recent Labs Lab 01/06/17 0430 01/07/17 0533 01/08/17 0505  WBC 9.5 14.4* 12.1*  RBC 3.40* 3.52* 3.61*  HGB 11.8* 12.1* 12.3*  HCT 33.0* 34.2* 35.2*  MCV 97.1 97.2 97.5  MCH 34.7* 34.4* 34.1*  MCHC 35.8 35.4 34.9  RDW 13.4 13.8 13.9  PLT 150 161 163    Cardiac Enzymes Recent Labs Lab 01/05/17 0846 01/05/17 1434 01/05/17 1933 01/06/17 0119  TROPONINI 0.03* 0.03* 0.04* 0.03*   No results for input(s): TROPIPOC in the last 168 hours.   BNP Recent Labs Lab 01/05/17 0846  BNP 341.0*     Radiology    CXR 01/05/2017 FINDINGS: Normal cardiac silhouette. No effusion, infiltrate, or pneumothorax. Patient rotated rightward. No acute osseous abnormality.  IMPRESSION: No acute cardiopulmonary process.  Cardiac Studies   Echocardiogram 01/05/2017 Left ventricle: The cavity size was normal. Systolic function was normal. The estimated ejection fraction was in the range of 60% to 65%. Wall motion was normal; there were no  regional wall motion abnormalities. The study was not technically sufficient to allow evaluation of LV diastolic dysfunction due to atrial fibrillation. Mild concentric and moderate focal basal septal hypertrophy. - Aortic valve: Trileaflet; mildly calcified leaflets. There was no stenosis. - Mitral valve: Moderately calcified annulus. - Left atrium: The atrium was mildly dilated.   Patient Profile     76 y.o. male chronic atrial fibrillation on Coumadin, previously documented cardiomyopathy with LVEF 45%, COPD with tobacco abuse, and CKD stage III, we are following for atrial fib with RVR.   Assessment & Plan    1. Atrial fib with RVR: Remains on diltiazem gtt at 10 mg.hr which is supplementing high-dose oral diltiazem 90 mg every 6 hours.[name] HR's in the low 100's and some into the 90's but increase with desaturations and minimal exertion. BP remains soft. Will try and add bisoprolol at this time since he was on beta blocker as an outpatient, need to follow pulmonary status closely. Remains on coumadin per pharmacy. INR this am 3.25. Trying to avoid amiodarone.  2. Hx of Cardiomyopathy: Current echocardiogram reveals EF of 60-65%,   3. COPD: Followed by Dr. Luan Pulling. Breathing status remains tenuous.   4. CKD Stage III: Creatinine rising to 3.90 this am. Nephrology following.   Signed, Jory Sims, NP  01/09/2017, 7:15 AM     Attending note:  Patient seen and examined. Discussed with Dr. Roderic Palau as well. Heart rates are elevated, somewhat better in general, but he is still requiring IV diltiazem to supplement higher dose oral diltiazem, also remains on Coumadin for stroke prophylaxis, followed by pharmacy. We will try to add bisoprolol since he had been on a beta blocker as an outpatient, hopefully more beta selective form will be tolerated from a pulmonary perspective. Trying to avoid adding amiodarone.  Satira Sark, M.D., F.A.C.C.

## 2017-01-09 NOTE — Evaluation (Signed)
Clinical/Bedside Swallow Evaluation Patient Details  Name: Don [name] MRN: [id number] Date of Birth: [birth year]  Today's Date: 01/09/2017 Time: SLP Start Time (ACUTE ONLY): 1000 SLP Stop Time (ACUTE ONLY): 1030 SLP Time Calculation (min) (ACUTE ONLY): 30 min  Past Medical History:  Past Medical History:  Diagnosis Date  . Acute bronchitis 04/03/2014  . Anxiety   . Atrial fibrillation ([place])   . Cataract   . COPD (chronic obstructive pulmonary disease) ([place])   . Essential hypertension   . Hyperlipidemia   . Macular degeneration   . Noncompliance with medications 04/2013   Xarelto, digoxin previously  . Pre-diabetes   . Prostate cancer ([place])   . Stage III chronic kidney disease 04/03/2014  . Tubular adenoma of colon 07/31/02, 11/18/03   Past Surgical History:  Past Surgical History:  Procedure Laterality Date  . Anal abcess,Hemorroids,    . COLONOSCOPY N/A 04/05/2014   Procedure: COLONOSCOPY;  Surgeon: Ladene Artist, MD;  Location: [facility];  Service: Endoscopy;  Laterality: N/A;  . COLONOSCOPY W/ BIOPSIES  11/18/2003   Dr. Earle Gell  . ESOPHAGOGASTRODUODENOSCOPY  11/18/2003   Dr. Earle Gell  . ESOPHAGOGASTRODUODENOSCOPY N/A 04/05/2014   Procedure: ESOPHAGOGASTRODUODENOSCOPY (EGD);  Surgeon: Ladene Artist, MD;  Location: [facility];  Service: Endoscopy;  Laterality: N/A;  . RETROPUBIC PROSTATECTOMY  11/26/2001  . TONSILLECTOMY     HPI:  76 year old male with history of COPD and tobacco abuse, came to the emergency room with complaints of shortness of breath. Found to have COPD exacerbation and tested positive for influenza. He also had rapid atrial fibrillation requiring Cardizem infusion. Started on steroids, bronchodilators and antibiotics. Respiratory status has been slow to improve. Rapid atrial fibrillation being driven by respiratory status. Cardiology following. Also has AKI on CKD3 and nephrology is following. Anticipate he will be in the hospital several  more days   Assessment / Plan / Recommendation Clinical Impression  Pt seen for clinical swallow evaluation while sitting up in recliner. Respiratory therapist voiced concerns about chest congestion noted yesterday that seemed to be worse following meals. SLP reviewed chart and found that pt was seen by SLP in 2015 following a "choking episode" on chicken which required the Heimlich. MBSS was completed 04/07/2014 with recommendation for D3/mech soft and thin liquids; pt noted to have silent penetration of thin liquids which was mitigated with implementation of "chin tuck" with liquids. Pt able to recall and verbalize need for chin tuck and also "to eat slowly". Oral motor examination essentially unremarkable except for xerostomia (he was unable to generate a volitional/dry swallow). Volitional cough is strong, but congested with some audible wheeze. Pt with one episode of delayed, congested cough after thin liquids via cup. SLP reinforced need for small sips with chin tuck, repeat swallow, and clear throat intermittently. Recommendations were also written down and posted at foot of bed for Pt to visualize during meals. Pt is felt to be at mild/mod risk for aspiration given h/o of dysphagia and COPD. Pt reports no recent episodes of PNA, however does endorse COPD exacerbations. SLP provided education on increased risk for aspiration in setting of COPD (difficulty with coordination of respiration and swallow). SLP further suggested that if pt noted to have increased difficulties, we could complete MBSS if indicated. Pt appreciative for the information. SLP will sign off, however if problems arise please re-consult and consider MBSS. Above to RN.  SLP Visit Diagnosis: Dysphagia, unspecified (R13.10)    Aspiration Risk  Mild aspiration risk  Diet Recommendation Regular;Thin liquid   Liquid Administration via: Cup;Straw Medication Administration: Whole meds with liquid Supervision: Patient able to self  feed;Intermittent supervision to cue for compensatory strategies Compensations: Slow rate;Small sips/bites;Multiple dry swallows after each bite/sip;Clear throat intermittently;Chin tuck (chin tuck with liquids) Postural Changes: Seated upright at 90 degrees;Remain upright for at least 30 minutes after po intake    Other  Recommendations Oral Care Recommendations: Oral care BID;Patient independent with oral care Other Recommendations: Clarify dietary restrictions   Follow up Recommendations None      Frequency and Duration  N/A         Prognosis Prognosis for Safe Diet Advancement: Good      Swallow Study   General Date of Onset: 01/05/17 HPI: 76 year old male with history of COPD and tobacco abuse, came to the emergency room with complaints of shortness of breath. Found to have COPD exacerbation and tested positive for influenza. He also had rapid atrial fibrillation requiring Cardizem infusion. Started on steroids, bronchodilators and antibiotics. Respiratory status has been slow to improve. Rapid atrial fibrillation being driven by respiratory status. Cardiology following. Also has AKI on CKD3 and nephrology is following. Anticipate he will be in the hospital several more days Type of Study: Bedside Swallow Evaluation Previous Swallow Assessment: 04/07/2014 MBSS with rec for D3/thin; silent penetration thin Diet Prior to this Study: Regular;Thin liquids Temperature Spikes Noted: No Respiratory Status: Nasal cannula History of Recent Intubation: No Behavior/Cognition: Alert;Cooperative;Pleasant mood Oral Cavity Assessment: Within Functional Limits;Dry Oral Care Completed by SLP: Yes Oral Cavity - Dentition: Adequate natural dentition Vision: Functional for self-feeding Self-Feeding Abilities: Able to feed self;Needs set up Patient Positioning: Upright in chair Baseline Vocal Quality: Normal Volitional Cough: Congested Volitional Swallow: Unable to elicit (due to xerostomia)     Oral/Motor/Sensory Function Overall Oral Motor/Sensory Function: Within functional limits   Ice Chips Ice chips: Within functional limits Presentation: Spoon   Thin Liquid Thin Liquid: Impaired Presentation: Cup;Self Fed;Straw Pharyngeal  Phase Impairments: Cough - Delayed    Nectar Thick Nectar Thick Liquid: Not tested   Honey Thick Honey Thick Liquid: Not tested   Puree Puree: Within functional limits Presentation: Spoon   Solid   Thank you,  Genene Churn, CCC-SLP [phone]    Solid: Within functional limits Presentation: Self Fed        Don [name] 01/09/2017,11:45 AM

## 2017-01-09 NOTE — Progress Notes (Signed)
PROGRESS NOTE    Don [name]  EHM:[id number] DOB: 01-24-1941 DOA: 01/05/2017 PCP: Wardell Honour, MD    Brief Narrative:  76 year old male with history of COPD and tobacco abuse, came to the emergency room with complaints of shortness of breath. Found to have COPD exacerbation and tested positive for influenza. He also had rapid atrial fibrillation requiring Cardizem infusion. Started on steroids, bronchodilators and antibiotics. Respiratory status has been slow to improve. Rapid atrial fibrillation being driven by respiratory status. Cardiology following. Also has AKI on CKD3 and nephrology is following. Anticipate he will be in the hospital several more days   Assessment & Plan:   Active Problems:   Hypertension   Tobacco abuse   Stage III chronic kidney disease   COPD exacerbation (HCC)   Atrial fibrillation with RVR (HCC)   Acute respiratory failure with hypoxia (HCC)   AKI (acute kidney injury) ([place])   Influenza A   1. Acute respiratory failure with hypoxia. Related to COPD exacerbation. We'll start to wean off oxygen as his clinical condition improves.  2. COPD exacerbation. Continue the patient on IV steroids, antibiotics and bronchodilators. Continues to wheeze and is short of breath. Continue pulmonary hygiene.  3. Atrial fibrillation with RVR. Likely precipitated by underlying respiratory issues. He does take metoprolol at home, which is being held for now due to significant bronchospasms. He also takes diltiazem. Patient is currently on intravenous diltiazem in order to achieve better control. Heart rate is currently uncontrolled. He is also on oral diltiazem in the hopes of weaning him off the infusion. Blood pressures are low making further titration of medications challenging. Digoxin not good option due to renal issues. Cardiology following. He is anticoagulated with Coumadin. TSH normal. Echocardiogram unremarkable, EF 60-65%.  4. AKI on CKD3. Creatinine  continues to trend up at 3.9. BUN also trending up. Nephrology following. He is being started on IV fluids and lasix. Follow urine output.   5. Hypertension. Blood pressures currently stable. Continue current treatments.  6. Tobacco use. Counseled on importance of tobacco cessation.  7. Influenza A. On tamiflu  8. Hyperglycemia. Likely related to steroids. Check A1c. Start on SSI   DVT prophylaxis: Coumadin Code Status: Partial code, no CPR, no defibrillation Family Communication: No family present Disposition Plan: Discharge home once improved   Consultants:   Cardiology  Nephrology  Procedures:  Echo:- Left ventricle: The cavity size was normal. Systolic function was   normal. The estimated ejection fraction was in the range of 60%   to 65%. Wall motion was normal; there were no regional wall   motion abnormalities. The study was not technically sufficient to   allow evaluation of LV diastolic dysfunction due to atrial   fibrillation. Mild concentric and moderate focal basal septal   hypertrophy. - Aortic valve: Trileaflet; mildly calcified leaflets. There was no   stenosis. - Mitral valve: Moderately calcified annulus.  - Left atrium: The atrium was mildly dilated.  Antimicrobials:   Levofloxacin 2/23>>    Subjective: Still feels short of breath, although may be a little better than yesterday. Non productive cough  Objective: Vitals:   01/09/17 0600 01/09/17 0719 01/09/17 0736 01/09/17 0748  BP: 125/81     Pulse: (!) 111 (!) 114    Resp: (!) 24 (!) 24    Temp:  98 F (36.7 C)    TempSrc:  Oral    SpO2: 94% 96% 95% 100%  Weight:      Height:  Intake/Output Summary (Last 24 hours) at 01/09/17 0857 Last data filed at 01/09/17 0843  Gross per 24 hour  Intake          2784.25 ml  Output              900 ml  Net          1884.25 ml   Filed Weights   01/07/17 0500 01/08/17 0400 01/09/17 0500  Weight: 76.3 kg (168 lb 3.4 oz) 77 kg (169 lb 12.1  oz) 79.9 kg (176 lb 2.4 oz)    Examination:  General exam: Appears calm and comfortable  Respiratory system: bilateral rhonchi and wheezes. Respiratory effort normal. Cardiovascular system: S1 & S2 heard, irregular. No JVD, murmurs, rubs, gallops or clicks. 1+ pedal edema. Gastrointestinal system: Abdomen is nondistended, soft and nontender. No organomegaly or masses felt. Normal bowel sounds heard. Central nervous system: Alert and oriented. No focal neurological deficits. Extremities: Symmetric 5 x 5 power. Skin: No rashes, lesions or ulcers Psychiatry: Judgement and insight appear normal. Mood & affect appropriate.     Data Reviewed: I have personally reviewed following labs and imaging studies  CBC:  Recent Labs Lab 01/05/17 0846 01/06/17 0430 01/07/17 0533 01/08/17 0505  WBC 5.9 9.5 14.4* 12.1*  NEUTROABS 4.8  --   --   --   HGB 14.0 11.8* 12.1* 12.3*  HCT 40.9 33.0* 34.2* 35.2*  MCV 99.0 97.1 97.2 97.5  PLT 152 150 161 638   Basic Metabolic Panel:  Recent Labs Lab 01/05/17 0846 01/06/17 0430 01/07/17 0533 01/08/17 0505 01/09/17 0401  NA 135 136 136 135 133*  K 4.8 4.1 4.1 4.2 4.4  CL 98* 102 102 102 100*  CO2 '28 24 25 23 23  '$ GLUCOSE 110* 187* 200* 213* 235*  BUN 48* 56* 67* 81* 88*  CREATININE 3.07* 3.16* 3.18* 3.71* 3.90*  CALCIUM 8.9 8.6* 8.5* 8.7* 8.3*  PHOS  --   --   --   --  4.4   GFR: Estimated Creatinine Clearance: 15.9 mL/min (by C-G formula based on SCr of 3.9 mg/dL (H)). Liver Function Tests:  Recent Labs Lab 01/05/17 0846 01/09/17 0401  AST 47*  --   ALT 18  --   ALKPHOS 56  --   BILITOT 1.0  --   PROT 6.3*  --   ALBUMIN 3.2* 2.6*   No results for input(s): LIPASE, AMYLASE in the last 168 hours. No results for input(s): AMMONIA in the last 168 hours. Coagulation Profile:  Recent Labs Lab 01/05/17 0846 01/06/17 0430 01/07/17 0533 01/08/17 0505 01/09/17 0401  INR 1.98 2.08 2.53 3.10 3.25   Cardiac Enzymes:  Recent  Labs Lab 01/05/17 0846 01/05/17 1434 01/05/17 1933 01/06/17 0119  TROPONINI 0.03* 0.03* 0.04* 0.03*   BNP (last 3 results) No results for input(s): PROBNP in the last 8760 hours. HbA1C: No results for input(s): HGBA1C in the last 72 hours. CBG: No results for input(s): GLUCAP in the last 168 hours. Lipid Profile: No results for input(s): CHOL, HDL, LDLCALC, TRIG, CHOLHDL, LDLDIRECT in the last 72 hours. Thyroid Function Tests: No results for input(s): TSH, T4TOTAL, FREET4, T3FREE, THYROIDAB in the last 72 hours. Anemia Panel: No results for input(s): VITAMINB12, FOLATE, FERRITIN, TIBC, IRON, RETICCTPCT in the last 72 hours. Sepsis Labs: No results for input(s): PROCALCITON, LATICACIDVEN in the last 168 hours.  Recent Results (from the past 240 hour(s))  MRSA PCR Screening     Status: None   Collection Time: 01/05/17  1:34 PM  Result Value Ref Range Status   MRSA by PCR NEGATIVE NEGATIVE Final    Comment:        The GeneXpert MRSA Assay (FDA approved for NASAL specimens only), is one component of a comprehensive MRSA colonization surveillance program. It is not intended to diagnose MRSA infection nor to guide or monitor treatment for MRSA infections.          Radiology Studies: No results found.      Scheduled Meds: . atorvastatin  20 mg Oral q1800  . diltiazem  90 mg Oral Q6H  . DULoxetine  60 mg Oral Daily  . furosemide  40 mg Intravenous BID  . guaiFENesin  1,200 mg Oral BID  . ipratropium  0.5 mg Nebulization Q4H WA  . iron polysaccharides  150 mg Oral Daily  . levalbuterol  0.63 mg Nebulization Q4H WA  . levofloxacin  500 mg Oral Q48H  . methylPREDNISolone (SOLU-MEDROL) injection  60 mg Intravenous Q6H  . mometasone-formoterol  2 puff Inhalation BID  . omega-3 acid ethyl esters  2 g Oral BID  . oseltamivir  30 mg Oral Q2000  . pantoprazole  40 mg Oral Daily  . polyethylene glycol  17 g Oral Daily  . terazosin  2 mg Oral QHS  . Warfarin -  Pharmacist Dosing Inpatient   Does not apply Q24H   Continuous Infusions: . sodium chloride    . diltiazem (CARDIZEM) infusion 10 mg/hr (01/09/17 0350)     LOS: 4 days    Time spent: 96mns    Quantez Schnyder, MD [facility] Pager [phone] If 7PM-7AM, please contact night-coverage [email or web address] Password [email or web address]2/27/2018, 8:57 AM

## 2017-01-09 NOTE — Consult Note (Signed)
   [facility] Inpatient Consult   01/09/2017  Don [name] 12/06/1940 [id number]   Chart review revealed patient eligible for [facility] Management services and post hospital discharge follow up related to a diagnosis of COPD. Patient was evaluated for Telephonic based chronic disease management services with [facility] care Management Program as a benefit of patient's [facility] Medicare. Met with the patient at the bedside to explain [facility] Management services. Patient endorses his primary care provider to be Dr. Alain Honey. Verbal consent given. Patient gave [phone] as the best number to reach him. Patient will receive post hospital discharge calls and be evaluated for any further case management needs.[facility] Care Management services do not interfere with or replace any services arranged by the inpatient care management team. RNCM left contact information and [facility] literature with consent at the bedside. Made inpatient RNCM aware that [facility] will be following for care management. For additional questions please contact:   Tonji Elliff RN, [facility] Liaison  [phone]) Business Mobile [phone]) Toll free office'

## 2017-01-10 DIAGNOSIS — R0602 Shortness of breath: Secondary | ICD-10-CM

## 2017-01-10 DIAGNOSIS — R0603 Acute respiratory distress: Secondary | ICD-10-CM

## 2017-01-10 LAB — RENAL FUNCTION PANEL
ALBUMIN: 2.5 g/dL — AB (ref 3.5–5.0)
Anion gap: 11 (ref 5–15)
BUN: 90 mg/dL — AB (ref 6–20)
CALCIUM: 8.1 mg/dL — AB (ref 8.9–10.3)
CO2: 22 mmol/L (ref 22–32)
Chloride: 98 mmol/L — ABNORMAL LOW (ref 101–111)
Creatinine, Ser: 3.88 mg/dL — ABNORMAL HIGH (ref 0.61–1.24)
GFR calc Af Amer: 16 mL/min — ABNORMAL LOW (ref >60)
GFR calc non Af Amer: 14 mL/min — ABNORMAL LOW (ref >60)
Glucose, Bld: 234 mg/dL — ABNORMAL HIGH (ref 65–99)
PHOSPHORUS: 4.8 mg/dL — AB (ref 2.5–4.6)
POTASSIUM: 4.5 mmol/L (ref 3.5–5.1)
SODIUM: 131 mmol/L — AB (ref 135–145)

## 2017-01-10 LAB — CBC WITH DIFFERENTIAL/PLATELET
Basophils Absolute: 0 10*3/uL (ref 0.0–0.1)
Basophils Relative: 0 %
EOS ABS: 0 10*3/uL (ref 0.0–0.7)
EOS PCT: 0 %
HCT: 32.8 % — ABNORMAL LOW (ref 39.0–52.0)
HEMOGLOBIN: 11.8 g/dL — AB (ref 13.0–17.0)
LYMPHS ABS: 0.5 10*3/uL — AB (ref 0.7–4.0)
Lymphocytes Relative: 6 %
MCH: 34.3 pg — AB (ref 26.0–34.0)
MCHC: 36 g/dL (ref 30.0–36.0)
MCV: 95.3 fL (ref 78.0–100.0)
MONOS PCT: 4 %
Monocytes Absolute: 0.3 10*3/uL (ref 0.1–1.0)
NEUTROS PCT: 90 %
Neutro Abs: 7.4 10*3/uL (ref 1.7–7.7)
Platelets: 156 10*3/uL (ref 150–400)
RBC: 3.44 MIL/uL — ABNORMAL LOW (ref 4.22–5.81)
RDW: 13.4 % (ref 11.5–15.5)
WBC: 8.2 10*3/uL (ref 4.0–10.5)

## 2017-01-10 LAB — GLUCOSE, CAPILLARY
GLUCOSE-CAPILLARY: 188 mg/dL — AB (ref 65–99)
Glucose-Capillary: 197 mg/dL — ABNORMAL HIGH (ref 65–99)
Glucose-Capillary: 257 mg/dL — ABNORMAL HIGH (ref 65–99)
Glucose-Capillary: 129 mg/dL — ABNORMAL HIGH (ref 65–99)

## 2017-01-10 LAB — HEMOGLOBIN A1C
Hgb A1c MFr Bld: 6 % — ABNORMAL HIGH (ref 4.8–5.6)
MEAN PLASMA GLUCOSE: 126 mg/dL

## 2017-01-10 LAB — PROTIME-INR
INR: 3.05
PROTHROMBIN TIME: 32.2 s — AB (ref 11.4–15.2)

## 2017-01-10 MED ORDER — WARFARIN SODIUM 2 MG PO TABS
2.0000 mg | ORAL_TABLET | Freq: Once | ORAL | Status: AC
  Administered 2017-01-10: 2 mg via ORAL
  Filled 2017-01-10: qty 1

## 2017-01-10 MED ORDER — ORAL CARE MOUTH RINSE
15.0000 mL | Freq: Two times a day (BID) | OROMUCOSAL | Status: DC
  Administered 2017-01-10 – 2017-01-18 (×15): 15 mL via OROMUCOSAL

## 2017-01-10 NOTE — Progress Notes (Signed)
Progress Note  Patient Name: Don [name] Date of Encounter: 01/10/2017  Primary Cardiologist: Dr. Minus Breeding  Subjective   Weak, short of breath, intermittent coughing. No chest pain or palpitations.  Inpatient Medications    Scheduled Meds: . atorvastatin  20 mg Oral q1800  . bisoprolol  2.5 mg Oral BID  . diltiazem  90 mg Oral Q6H  . DULoxetine  60 mg Oral Daily  . furosemide  40 mg Intravenous BID  . guaiFENesin  1,200 mg Oral BID  . insulin aspart  0-15 Units Subcutaneous TID WC  . insulin aspart  0-5 Units Subcutaneous QHS  . ipratropium  0.5 mg Nebulization QID  . iron polysaccharides  150 mg Oral Daily  . levalbuterol  0.63 mg Nebulization QID  . levofloxacin  500 mg Oral Q48H  . mouth rinse  15 mL Mouth Rinse BID  . methylPREDNISolone (SOLU-MEDROL) injection  60 mg Intravenous Q6H  . mometasone-formoterol  2 puff Inhalation BID  . omega-3 acid ethyl esters  2 g Oral BID  . pantoprazole  40 mg Oral Daily  . polyethylene glycol  17 g Oral Daily  . terazosin  2 mg Oral QHS  . Warfarin - Pharmacist Dosing Inpatient   Does not apply Q24H   Continuous Infusions: . sodium chloride 125 mL/hr at 01/10/17 0200  . diltiazem (CARDIZEM) infusion Stopped (01/09/17 1630)   PRN Meds: acetaminophen **OR** acetaminophen, clonazePAM, levalbuterol, nitroGLYCERIN, ondansetron **OR** ondansetron (ZOFRAN) IV, polyvinyl alcohol   Vital Signs    Vitals:   01/10/17 0700 01/10/17 0710 01/10/17 0728 01/10/17 0740  BP: 122/71     Pulse: (!) 103 (!) 102 (!) 106   Resp: '19 18 18   '$ Temp:   97.7 F (36.5 C)   TempSrc:   Oral   SpO2: 94% 94% 95% 95%  Weight:      Height:        Intake/Output Summary (Last 24 hours) at 01/10/17 0825 Last data filed at 01/10/17 0500  Gross per 24 hour  Intake          3639.17 ml  Output             1700 ml  Net          1939.17 ml   Filed Weights   01/07/17 0500 01/08/17 0400 01/09/17 0500  Weight: 168 lb 3.4 oz (76.3 kg) 169 lb 12.1  oz (77 kg) 176 lb 2.4 oz (79.9 kg)    Telemetry    Atrial fibrillation. Personally reviewed.  Physical Exam   GEN: Elderly male, no distress.   Neck: No JVD Cardiac:  Irregularly irregular, distant, no gallop. Respiratory: . Coarse scattered rhonchi, forced expiratory wheeze in the upper airways.  GI: Soft, nontender, non-distended  MS: No edema; No deformity.   Labs    Chemistry Recent Labs Lab 01/05/17 0846  01/08/17 0505 01/09/17 0401 01/10/17 0422  NA 135  < > 135 133* 131*  K 4.8  < > 4.2 4.4 4.5  CL 98*  < > 102 100* 98*  CO2 28  < > '23 23 22  '$ GLUCOSE 110*  < > 213* 235* 234*  BUN 48*  < > 81* 88* 90*  CREATININE 3.07*  < > 3.71* 3.90* 3.88*  CALCIUM 8.9  < > 8.7* 8.3* 8.1*  PROT 6.3*  --   --   --   --   ALBUMIN 3.2*  --   --  2.6* 2.5*  AST 47*  --   --   --   --  ALT 18  --   --   --   --   ALKPHOS 56  --   --   --   --   BILITOT 1.0  --   --   --   --   GFRNONAA 18*  < > 14* 14* 14*  GFRAA 21*  < > 17* 16* 16*  ANIONGAP 9  < > '10 10 11  '$ < > = values in this interval not displayed.   Hematology Recent Labs Lab 01/07/17 0533 01/08/17 0505 01/10/17 0422  WBC 14.4* 12.1* 8.2  RBC 3.52* 3.61* 3.44*  HGB 12.1* 12.3* 11.8*  HCT 34.2* 35.2* 32.8*  MCV 97.2 97.5 95.3  MCH 34.4* 34.1* 34.3*  MCHC 35.4 34.9 36.0  RDW 13.8 13.9 13.4  PLT 161 163 156    Cardiac Enzymes Recent Labs Lab 01/05/17 0846 01/05/17 1434 01/05/17 1933 01/06/17 0119  TROPONINI 0.03* 0.03* 0.04* 0.03*   No results for input(s): TROPIPOC in the last 168 hours.   BNP Recent Labs Lab 01/05/17 0846  BNP 341.0*     Radiology    Chest x-ray 01/05/2017: FINDINGS: Normal cardiac silhouette. No effusion, infiltrate, or pneumothorax. Patient rotated rightward. No acute osseous abnormality.  IMPRESSION: No acute cardiopulmonary process.  Cardiac Studies   Echocardiogram 01/05/2017: Study Conclusions  - Left ventricle: The cavity size was normal. Systolic function  was   normal. The estimated ejection fraction was in the range of 60%   to 65%. Wall motion was normal; there were no regional wall   motion abnormalities. The study was not technically sufficient to   allow evaluation of LV diastolic dysfunction due to atrial   fibrillation. Mild concentric and moderate focal basal septal   hypertrophy. - Aortic valve: Trileaflet; mildly calcified leaflets. There was no   stenosis. - Mitral valve: Moderately calcified annulus. - Left atrium: The atrium was mildly dilated.  Patient Profile     76 y.o. male with chronic atrial fibrillation on Coumadin, previously documented cardiomyopathy with LVEF 45% (now normal), COPD with tobacco abuse, and CKD stage III, we are following for atrial fib with RVR. He has an active COPD exacerbation and influenza A.  Assessment & Plan    1. Chronic atrial fibrillation, recent RVR improving with medication adjustments. He is now on high-dose oral diltiazem with bisoprolol recently added which he is tolerating so far. He weaned off intravenous diltiazem yesterday. Continues on Coumadin for stroke prophylaxis with management per pharmacy. INR 3.1.  2. History of cardiomyopathy, LVEF 60-65% by recent echocardiogram.  3. COPD exacerbation.  4. CKD, stage 3-4. Creatinine 3.6.  5. Influenza A.  Heart rate generally in the 90-100 range in atrial fibrillation at this point on Cardizem 90 mg every 6 hours and bisoprolol 2.5 mg twice daily. No changes made today. Hopefully as his respiratory status improves, heart rate control will be less of a problem. He had been on combination of calcium channel blocker and beta blocker as an outpatient. Continue Coumadin per pharmacy.  Signed, Rozann Lesches, MD  01/10/2017, 8:25 AM

## 2017-01-10 NOTE — Progress Notes (Signed)
Don [name]  MRN: [id number]  DOB/AGE: 1940-11-15 76 y.o.  Primary Care Physician:MILLER, Lillette Boxer, MD  Admit date: 01/05/2017  Chief Complaint:  Chief Complaint  Patient presents with  . Shortness of Breath    S-Pt presented on  01/05/2017 with  Chief Complaint  Patient presents with  . Shortness of Breath  .    Pt today feels a little  Better.    Pt says " I am better than before but still sore"     Meds . atorvastatin  20 mg Oral q1800  . bisoprolol  2.5 mg Oral BID  . diltiazem  90 mg Oral Q6H  . DULoxetine  60 mg Oral Daily  . furosemide  40 mg Intravenous BID  . guaiFENesin  1,200 mg Oral BID  . insulin aspart  0-15 Units Subcutaneous TID WC  . insulin aspart  0-5 Units Subcutaneous QHS  . ipratropium  0.5 mg Nebulization QID  . iron polysaccharides  150 mg Oral Daily  . levalbuterol  0.63 mg Nebulization QID  . levofloxacin  500 mg Oral Q48H  . mouth rinse  15 mL Mouth Rinse BID  . methylPREDNISolone (SOLU-MEDROL) injection  60 mg Intravenous Q6H  . mometasone-formoterol  2 puff Inhalation BID  . omega-3 acid ethyl esters  2 g Oral BID  . pantoprazole  40 mg Oral Daily  . polyethylene glycol  17 g Oral Daily  . terazosin  2 mg Oral QHS  . warfarin  2 mg Oral Once  . Warfarin - Pharmacist Dosing Inpatient   Does not apply Q24H        Physical Exam: Vital signs in last 24 hours: Temp:  [97.6 F (36.4 C)-98.8 F (37.1 C)] 97.7 F (36.5 C) (02/28 0728) Pulse Rate:  [83-116] 106 (02/28 0728) Resp:  [15-24] 18 (02/28 0728) BP: (104-151)/(56-94) 122/71 (02/28 0700) SpO2:  [94 %-100 %] 95 % (02/28 0740) Weight change:  Last BM Date: 01/08/17  Intake/Output from previous day: 02/27 0701 - 02/28 0700 In: 3639.2 [P.O.:1030; I.V.:2609.2] Out: 1700 [Urine:1700] No intake/output data recorded.   Physical Exam: General- pt is awake,alert, oriented to time place and person Resp- No acute REsp distress, Rhonchi CVS- S1S2 irregular in rate and  rhythm GIT- BS+, soft, NT, ND EXT- NO LE Edema, Cyanosis   Lab Results: CBC  Recent Labs  01/08/17 0505 01/10/17 0422  WBC 12.1* 8.2  HGB 12.3* 11.8*  HCT 35.2* 32.8*  PLT 163 156    BMET  Recent Labs  01/09/17 0401 01/10/17 0422  NA 133* 131*  K 4.4 4.5  CL 100* 98*  CO2 23 22  GLUCOSE 235* 234*  BUN 88* 90*  CREATININE 3.90* 3.88*  CALCIUM 8.3* 8.1*   Creat trend 2018 2.1=>3.9 2017 2.1 2016 1.8--1.9 2015  1.4--1.6 2008   1.8     MICRO Recent Results (from the past 240 hour(s))  MRSA PCR Screening     Status: None   Collection Time: 01/05/17  1:34 PM  Result Value Ref Range Status   MRSA by PCR NEGATIVE NEGATIVE Final    Comment:        The GeneXpert MRSA Assay (FDA approved for NASAL specimens only), is one component of a comprehensive MRSA colonization surveillance program. It is not intended to diagnose MRSA infection nor to guide or monitor treatment for MRSA infections.       Lab Results  Component Value Date   PTH 74 (H) 01/05/2016   CALCIUM 8.1 (  L) 01/10/2017   CAION 1.20 03/20/2014   PHOS 4.8 (H) 01/10/2017               Impression: 1)Renal  AKI secondary to ATN                AKI sec to SIRS( admitted with flu)               AKI on CKD               CKD stage 3.               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                Hematuria none.                Nephrolithiasis hx Absent                 AKI now at plateau   2)HTN  Medication-  On Diuretics. On Calcium Channel Blockers On Beta blockers On Alpha  Blockers.    3)Anemia HGb stable  4)CKD Mineral-Bone Disorder PTH acceptable  Secondary Hyperparathyroidism present  Phosphorus at goal.   5)ID-admitted with Influenza Primary MD  following  6)Electrolytes  Normokalemic  Hyponatremic   7)Acid base Co2 at goal  8) Afib- on Cardizem and warfarin   9) Resp admitted with shortness of breath Flu and COPD Primary MD following       Plan:   Will continue current care      [place] S 01/10/2017, 9:35 AM

## 2017-01-10 NOTE — Progress Notes (Signed)
PROGRESS NOTE    Don [name]  KXF:[id number] DOB: July 12, 1941 DOA: 01/05/2017 PCP: Wardell Honour, MD   Brief Narrative:  76 year old male with history of COPD and tobacco abuse, came to the emergency room with complaints of shortness of breath. Found to have COPD exacerbation and tested positive for influenza. He also had rapid atrial fibrillation requiring Cardizem infusion. Started on steroids, bronchodilators and antibiotics. Respiratory status has been slow to improve. Rapid atrial fibrillation being driven by respiratory status. Cardiology following. Also has AKI on CKD3 and nephrology is following. Slowly improving.   Assessment & Plan:   Active Problems:   Hypertension   Tobacco abuse   Stage III chronic kidney disease   COPD exacerbation (HCC)   Atrial fibrillation with RVR (HCC)   Acute respiratory failure with hypoxia (HCC)   AKI (acute kidney injury) ([place])   Influenza A  1. Acute respiratory failure with hypoxia related to COPD exacerbation and Influenza A.  -We'll start to wean off oxygen as his clinical condition improves.  -Maintain O2 Saturations >92% -Will likely need 6 minute walk screen prior to D/C when stable.   2. COPD exacerbation.  -Continue the patient on IV Methylprednisolone 60 mg q6h, Abx with Levofloxacin 500 mg po q48h and bronchodilators with Xopenex 0.63 mg Neb 4 Times Daily and 0.63 mg q2hprn and Ipratropium 0.5 mg Neb 4 times daily.  -Continues to wheeze and is short of breath. Continue pulmonary hygiene. -C/w Dulera 2 puff IH BID and Gauifenesin 1200 po BID -C/w Pantoprazole 40 mg po Daily while on IV Steroids   3. Atrial fibrillation with RVR.  -Likely precipitated by underlying respiratory issues.  -He does take Metoprolol at home, which is being held for now due to significant bronchospasms. He also takes diltiazem.  -Off of Dilitazem gtt and currently now on Diltazem 90 mg po q6h and Bisoprolol 2.5 mg po BID -Blood pressures are low  making further titration of medications challenging.  -Digoxin not good option due to renal issues.  -Cardiology following and appreciate Recc's -He is anticoagulated with Coumadin and continue Coumadin per Pharmacy. TSH normal. Echocardiogram unremarkable, EF 60-65%.  4. AKI on CKD3.  -BUN/Cr now 90/3.88 -Nephrology following and appreciate Recc's.  -He is being started on IV fluids at 125 mL/hr and IV Lasix 40 mg BID.  -Follow Strict Urine output.   5. Hypertension.  -Blood pressures currently stable.  -Continue current treatments with Diltiazem and Bisoprolol.    6. Tobacco use.  -Counseled on importance of tobacco cessation.  7. Influenza A.  -C/w Droplet Precautions -S/p Oseltamivir 30 mg po Daily x 5 days because of AKI  8. Hyperglycemia. -Likely related to IV Steroids. -HbA1c was 6.0.  -CBG's have been running from 188-257 -C/w Moderate Novolog SSI AC HS  9. Leukocytosis  -Improved -WBC went from 14.4 -> 12.1 -> 8.2 -Repeat CBC in AM  10. Hyponatremia -Slowly trending down went from 135 -> 133 -> 131 -Likely from IV Diuresis with Lasix -Continue to Trend CMP's  DVT prophylaxis: Anticoagulated with Coumadin Code Status: Partial Code, no CPR, No defibrilliation Family Communication: No family present at bedside Disposition Plan: Likely Home at D/C; Will obtain PT evaluation  Consultants:   Cardiology  Nephrology  Procedures:   ECHOCARDIOGRAM 01/05/17 Study Conclusions  - Left ventricle: The cavity size was normal. Systolic function was   normal. The estimated ejection fraction was in the range of 60%   to 65%. Wall motion was normal; there were no  regional wall   motion abnormalities. The study was not technically sufficient to   allow evaluation of LV diastolic dysfunction due to atrial   fibrillation. Mild concentric and moderate focal basal septal   hypertrophy. - Aortic valve: Trileaflet; mildly calcified leaflets. There was no    stenosis. - Mitral valve: Moderately calcified annulus. - Left atrium: The atrium was mildly dilated.   Antimicrobials:  Anti-infectives    Start     Dose/Rate Route Frequency Ordered Stop   01/09/17 1400  levofloxacin (LEVAQUIN) tablet 500 mg     500 mg Oral Every 48 hours 01/08/17 1345     01/05/17 2200  oseltamivir (TAMIFLU) capsule 75 mg  Status:  Discontinued     75 mg Oral 2 times daily 01/05/17 1836 01/05/17 1848   01/05/17 2000  oseltamivir (TAMIFLU) capsule 30 mg     30 mg Oral Daily 01/05/17 1848 01/09/17 1954   01/05/17 1400  levofloxacin (LEVAQUIN) tablet 750 mg  Status:  Discontinued     750 mg Oral Every 48 hours 01/05/17 1333 01/08/17 1345     Subjective: States he feels better and slept well last night. States he is slightly nauseous but no vomiting. States breathing is "doing pretty good but could be better."  No other complaints at this time.   Objective: Vitals:   01/10/17 0700 01/10/17 0710 01/10/17 0728 01/10/17 0740  BP: 122/71     Pulse: (!) 103 (!) 102 (!) 106   Resp: '19 18 18   '$ Temp:   97.7 F (36.5 C)   TempSrc:   Oral   SpO2: 94% 94% 95% 95%  Weight:      Height:        Intake/Output Summary (Last 24 hours) at 01/10/17 0935 Last data filed at 01/10/17 0500  Gross per 24 hour  Intake          3159.17 ml  Output             1700 ml  Net          1459.17 ml   Filed Weights   01/07/17 0500 01/08/17 0400 01/09/17 0500  Weight: 76.3 kg (168 lb 3.4 oz) 77 kg (169 lb 12.1 oz) 79.9 kg (176 lb 2.4 oz)   Examination: Physical Exam:  Constitutional: Appears calm and comfortable and in NAD but a little sleepy Eyes: Sclerae anicteric  ENMT: External Ears, Nose appear normal. Grossly normal hearing.  Neck: Appears normal, supple, no cervical masses, normal ROM, no appreciable thyromegaly or JVD Respiratory: Diminished to auscultation bilaterally with some wheezing and rhonchus sounds. Slightly increased respiratory effort. No accessory muscle use.  Wearing Supplemental O2 via [place].  Cardiovascular: Irregularly Irregular, no murmurs / rubs / gallops. S1 and S2 auscultated. Mild extremity edema.  Abdomen: Soft, non-tender, non-distended. No masses palpated. No appreciable hepatosplenomegaly. Bowel sounds positive x4.  GU: Deferred. Musculoskeletal: No clubbing / cyanosis of digits/nails. No joint deformity upper and lower extremities.  Skin: No rashes, lesions, ulcers on limited skin eval. No induration; Warm and dry.  Neurologic: CN 2-12 grossly intact with no focal deficits. Romberg sign cerebellar reflexes not assessed.  Psychiatric: Normal judgment and insight. Alert and oriented x 3. Normal mood and appropriate affect.   Data Reviewed: I have personally reviewed following labs and imaging studies  CBC:  Recent Labs Lab 01/05/17 0846 01/06/17 0430 01/07/17 0533 01/08/17 0505 01/10/17 0422  WBC 5.9 9.5 14.4* 12.1* 8.2  NEUTROABS 4.8  --   --   --  7.4  HGB 14.0 11.8* 12.1* 12.3* 11.8*  HCT 40.9 33.0* 34.2* 35.2* 32.8*  MCV 99.0 97.1 97.2 97.5 95.3  PLT 152 150 161 163 154   Basic Metabolic Panel:  Recent Labs Lab 01/06/17 0430 01/07/17 0533 01/08/17 0505 01/09/17 0401 01/10/17 0422  NA 136 136 135 133* 131*  K 4.1 4.1 4.2 4.4 4.5  CL 102 102 102 100* 98*  CO2 '24 25 23 23 22  '$ GLUCOSE 187* 200* 213* 235* 234*  BUN 56* 67* 81* 88* 90*  CREATININE 3.16* 3.18* 3.71* 3.90* 3.88*  CALCIUM 8.6* 8.5* 8.7* 8.3* 8.1*  PHOS  --   --   --  4.4 4.8*   GFR: Estimated Creatinine Clearance: 15.9 mL/min (by C-G formula based on SCr of 3.88 mg/dL (H)). Liver Function Tests:  Recent Labs Lab 01/05/17 0846 01/09/17 0401 01/10/17 0422  AST 47*  --   --   ALT 18  --   --   ALKPHOS 56  --   --   BILITOT 1.0  --   --   PROT 6.3*  --   --   ALBUMIN 3.2* 2.6* 2.5*   No results for input(s): LIPASE, AMYLASE in the last 168 hours. No results for input(s): AMMONIA in the last 168 hours. Coagulation Profile:  Recent Labs Lab  01/06/17 0430 01/07/17 0533 01/08/17 0505 01/09/17 0401 01/10/17 0422  INR 2.08 2.53 3.10 3.25 3.05   Cardiac Enzymes:  Recent Labs Lab 01/05/17 0846 01/05/17 1434 01/05/17 1933 01/06/17 0119  TROPONINI 0.03* 0.03* 0.04* 0.03*   BNP (last 3 results) No results for input(s): PROBNP in the last 8760 hours. HbA1C:  Recent Labs  01/09/17 0401  HGBA1C 6.0*   CBG:  Recent Labs Lab 01/09/17 1130 01/09/17 1552 01/09/17 2128 01/10/17 0726  GLUCAP 357* 140* 189* 197*   Lipid Profile: No results for input(s): CHOL, HDL, LDLCALC, TRIG, CHOLHDL, LDLDIRECT in the last 72 hours. Thyroid Function Tests: No results for input(s): TSH, T4TOTAL, FREET4, T3FREE, THYROIDAB in the last 72 hours. Anemia Panel: No results for input(s): VITAMINB12, FOLATE, FERRITIN, TIBC, IRON, RETICCTPCT in the last 72 hours. Sepsis Labs: No results for input(s): PROCALCITON, LATICACIDVEN in the last 168 hours.  Recent Results (from the past 240 hour(s))  MRSA PCR Screening     Status: None   Collection Time: 01/05/17  1:34 PM  Result Value Ref Range Status   MRSA by PCR NEGATIVE NEGATIVE Final    Comment:        The GeneXpert MRSA Assay (FDA approved for NASAL specimens only), is one component of a comprehensive MRSA colonization surveillance program. It is not intended to diagnose MRSA infection nor to guide or monitor treatment for MRSA infections.     Radiology Studies: No results found.  Scheduled Meds: . atorvastatin  20 mg Oral q1800  . bisoprolol  2.5 mg Oral BID  . diltiazem  90 mg Oral Q6H  . DULoxetine  60 mg Oral Daily  . furosemide  40 mg Intravenous BID  . guaiFENesin  1,200 mg Oral BID  . insulin aspart  0-15 Units Subcutaneous TID WC  . insulin aspart  0-5 Units Subcutaneous QHS  . ipratropium  0.5 mg Nebulization QID  . iron polysaccharides  150 mg Oral Daily  . levalbuterol  0.63 mg Nebulization QID  . levofloxacin  500 mg Oral Q48H  . mouth rinse  15 mL Mouth  Rinse BID  . methylPREDNISolone (SOLU-MEDROL) injection  60 mg Intravenous Q6H  .  mometasone-formoterol  2 puff Inhalation BID  . omega-3 acid ethyl esters  2 g Oral BID  . pantoprazole  40 mg Oral Daily  . polyethylene glycol  17 g Oral Daily  . terazosin  2 mg Oral QHS  . warfarin  2 mg Oral Once  . Warfarin - Pharmacist Dosing Inpatient   Does not apply Q24H   Continuous Infusions: . sodium chloride 125 mL/hr at 01/10/17 0837  . diltiazem (CARDIZEM) infusion Stopped (01/09/17 1630)    LOS: 5 days   Kerney Elbe, DO [facility] Pager [phone]  If 7PM-7AM, please contact night-coverage [email or web address] Password [facility] 01/10/2017, 9:35 AM

## 2017-01-10 NOTE — Progress Notes (Signed)
1133 Patient's heart rate noted to drop to 30s-40s. STAT 12 lead EKG ordered. MD notified.

## 2017-01-10 NOTE — Progress Notes (Signed)
**Note De-identified  Obfuscation** STAT EKG completed and reported to RN 

## 2017-01-10 NOTE — Progress Notes (Signed)
[place] for Coumadin (chronic Rx PTA) Indication: atrial fibrillation  Allergies  Allergen Reactions  . Wellbutrin [Bupropion] Anxiety    Patient Measurements: Height: '5\' 9"'$  (175.3 cm) Weight: 176 lb 2.4 oz (79.9 kg) IBW/kg (Calculated) : 70.7  Vital Signs: Temp: 97.7 F (36.5 C) (02/28 0728) Temp Source: Oral (02/28 0728) BP: 122/71 (02/28 0700) Pulse Rate: 106 (02/28 0728)  Labs:  Recent Labs  01/08/17 0505 01/09/17 0401 01/10/17 0422  HGB 12.3*  --  11.8*  HCT 35.2*  --  32.8*  PLT 163  --  156  LABPROT 32.6* 33.9* 32.2*  INR 3.10 3.25 3.05  CREATININE 3.71* 3.90* 3.88*    Estimated Creatinine Clearance: 15.9 mL/min (by C-G formula based on SCr of 3.88 mg/dL (H)).   Medical History: Past Medical History:  Diagnosis Date  . Acute bronchitis 04/03/2014  . Anxiety   . Atrial fibrillation ([place])   . Cataract   . COPD (chronic obstructive pulmonary disease) ([place])   . Essential hypertension   . Hyperlipidemia   . Macular degeneration   . Noncompliance with medications 04/2013   Xarelto, digoxin previously  . Pre-diabetes   . Prostate cancer ([place])   . Stage III chronic kidney disease 04/03/2014  . Tubular adenoma of colon 07/31/02, 11/18/03    Medications:  Prescriptions Prior to Admission  Medication Sig Dispense Refill Last Dose  . albuterol (PROVENTIL HFA;VENTOLIN HFA) 108 (90 BASE) MCG/ACT inhaler Inhale 2 puffs into the lungs every 6 (six) hours as needed for wheezing or shortness of breath. 1 Inhaler 2 Past Week at Unknown time  . ALLERGY RELIEF 10 MG tablet TAKE 1 TABLET DAILY FOR ALLERGY 30 tablet 5 Past Week at Unknown time  . atorvastatin (LIPITOR) 20 MG tablet TAKE 1 TABLET DAILY 30 tablet 2 Past Week at Unknown time  . budesonide-formoterol (SYMBICORT) 160-4.5 MCG/ACT inhaler Inhale 2 puffs into the lungs 2 (two) times daily. 1 Inhaler 3 Past Week at Unknown time  . clonazePAM (KLONOPIN) 1 MG tablet TAKE 1/2  TABLET ONCE DAILY AS NEEDED FOR ANXIETY 15 tablet 0 Past Week at Unknown time  . diltiazem (CARDIZEM CD) 300 MG 24 hr capsule TAKE (1) CAPSULE DAILY 30 capsule 1 Past Week at Unknown time  . doxycycline (ADOXA) 100 MG tablet Take 1 tablet (100 mg total) by mouth 2 (two) times daily. 20 tablet 0 Past Week at Unknown time  . doxycycline (VIBRA-TABS) 100 MG tablet Take 1 tablet by mouth 2 (two) times daily.   Past Week at Unknown time  . doxylamine, Sleep, (UNISOM) 25 MG tablet Take 25 mg by mouth at bedtime as needed.   Past Week at Unknown time  . DULoxetine (CYMBALTA) 60 MG capsule TAKE (1) CAPSULE DAILY 30 capsule 1 Past Week at Unknown time  . FERREX 150 150 MG capsule Take 1 capsule by mouth daily.   Past Week at Unknown time  . furosemide (LASIX) 40 MG tablet Take 1 tablet by mouth every other day.    Past Week at Unknown time  . metoprolol tartrate (LOPRESSOR) 25 MG tablet TAKE (1) TABLET TWICE A DAY. 60 tablet 4 Past Week at Unknown time  . Multiple Vitamin (MULTIVITAMIN WITH MINERALS) TABS Take 1 tablet by mouth daily.   Past Week at Unknown time  . nitroGLYCERIN (NITROSTAT) 0.4 MG SL tablet Place 1 tablet (0.4 mg total) under the tongue every 5 (five) minutes as needed for chest pain. 25 tablet 2 Past Week  at Unknown time  . omega-3 acid ethyl esters (LOVAZA) 1 G capsule TAKE (2) CAPSULES TWICE DAILY. 120 capsule 2 Past Week at Unknown time  . pantoprazole (PROTONIX) 40 MG tablet TAKE 1 TABLET DAILY 30 tablet 2 Past Week at Unknown time  . Polyethyl Glycol-Propyl Glycol (SYSTANE OP) Place 1 drop into both eyes 2 (two) times daily as needed (dry eyes).    Past Week at Unknown time  . Probiotic Product (PRO-BIOTIC BLEND) CAPS Take 1 capsule by mouth daily.   Past Week at Unknown time  . terazosin (HYTRIN) 2 MG capsule TAKE 1 CAPSULE AT BEDTIME 30 capsule 4 Past Week at Unknown time  . tiotropium (SPIRIVA) 18 MCG inhalation capsule Place 1 capsule (18 mcg total) into inhaler and inhale daily. 30  capsule 3 Past Week at Unknown time  . triamcinolone cream (KENALOG) 0.1 % Apply 1 application topically 2 (two) times daily. 80 g 0 Past Week at Unknown time  . warfarin (COUMADIN) 4 MG tablet Take 1 and 1/2 tablets (= '6mg'$ ) daily except on Sundays and Thursdays take 1 tablet (='4mg'$ ) 45 tablet 0 Past Week at Unknown time  . Incontinence Supply Disposable (INCONTINENCE BRIEF LARGE) MISC Use as needed for urinary incontinence.  Dx: urinary incontinence R32 and prostate cancer C61 200 each 1 Taking   Assessment: 76yo male on chronic Coumadin PTA.  INR is SUPRAtherapeutic but has trended down after coumadin held last 2 days.   Goal of Therapy:  INR 2-3   Plan:  Low dose of coumadin today, 2 mg. INR daily  Don [name] 01/10/2017,8:39 AM

## 2017-01-10 NOTE — Addendum Note (Signed)
Addended by: Cherre Robins on: 01/10/2017 02:52 PM   Modules accepted: Level of Service

## 2017-01-11 ENCOUNTER — Other Ambulatory Visit: Payer: Self-pay | Admitting: *Deleted

## 2017-01-11 ENCOUNTER — Encounter: Payer: Medicare HMO | Admitting: Pharmacist

## 2017-01-11 LAB — COMPREHENSIVE METABOLIC PANEL
ALT: 33 U/L (ref 17–63)
ANION GAP: 11 (ref 5–15)
AST: 41 U/L (ref 15–41)
Albumin: 2.3 g/dL — ABNORMAL LOW (ref 3.5–5.0)
Alkaline Phosphatase: 48 U/L (ref 38–126)
BUN: 86 mg/dL — ABNORMAL HIGH (ref 6–20)
CHLORIDE: 101 mmol/L (ref 101–111)
CO2: 21 mmol/L — AB (ref 22–32)
CREATININE: 3.8 mg/dL — AB (ref 0.61–1.24)
Calcium: 7.8 mg/dL — ABNORMAL LOW (ref 8.9–10.3)
GFR, EST AFRICAN AMERICAN: 16 mL/min — AB (ref >60)
GFR, EST NON AFRICAN AMERICAN: 14 mL/min — AB (ref >60)
Glucose, Bld: 211 mg/dL — ABNORMAL HIGH (ref 65–99)
POTASSIUM: 3.9 mmol/L (ref 3.5–5.1)
SODIUM: 133 mmol/L — AB (ref 135–145)
Total Bilirubin: 0.7 mg/dL (ref 0.3–1.2)
Total Protein: 4.8 g/dL — ABNORMAL LOW (ref 6.5–8.1)

## 2017-01-11 LAB — GLUCOSE, CAPILLARY
GLUCOSE-CAPILLARY: 202 mg/dL — AB (ref 65–99)
GLUCOSE-CAPILLARY: 206 mg/dL — AB (ref 65–99)
GLUCOSE-CAPILLARY: 202 mg/dL — AB (ref 65–99)
Glucose-Capillary: 179 mg/dL — ABNORMAL HIGH (ref 65–99)

## 2017-01-11 LAB — CBC WITH DIFFERENTIAL/PLATELET
Basophils Absolute: 0 10*3/uL (ref 0.0–0.1)
Basophils Relative: 0 %
EOS ABS: 0 10*3/uL (ref 0.0–0.7)
EOS PCT: 0 %
HCT: 33.2 % — ABNORMAL LOW (ref 39.0–52.0)
Hemoglobin: 12 g/dL — ABNORMAL LOW (ref 13.0–17.0)
LYMPHS ABS: 0.3 10*3/uL — AB (ref 0.7–4.0)
LYMPHS PCT: 4 %
MCH: 34.2 pg — AB (ref 26.0–34.0)
MCHC: 36.1 g/dL — AB (ref 30.0–36.0)
MCV: 94.6 fL (ref 78.0–100.0)
MONO ABS: 0.4 10*3/uL (ref 0.1–1.0)
Monocytes Relative: 5 %
Neutro Abs: 7 10*3/uL (ref 1.7–7.7)
Neutrophils Relative %: 91 %
PLATELETS: 158 10*3/uL (ref 150–400)
RBC: 3.51 MIL/uL — AB (ref 4.22–5.81)
RDW: 13.1 % (ref 11.5–15.5)
WBC: 7.8 10*3/uL (ref 4.0–10.5)

## 2017-01-11 LAB — MAGNESIUM: MAGNESIUM: 1.3 mg/dL — AB (ref 1.7–2.4)

## 2017-01-11 LAB — PROTIME-INR
INR: 2.58
Prothrombin Time: 28.2 seconds — ABNORMAL HIGH (ref 11.4–15.2)

## 2017-01-11 LAB — PHOSPHORUS: PHOSPHORUS: 5.1 mg/dL — AB (ref 2.5–4.6)

## 2017-01-11 MED ORDER — BISOPROLOL FUMARATE 5 MG PO TABS
5.0000 mg | ORAL_TABLET | Freq: Two times a day (BID) | ORAL | Status: DC
  Administered 2017-01-11 – 2017-01-16 (×11): 5 mg via ORAL
  Filled 2017-01-11 (×13): qty 1

## 2017-01-11 MED ORDER — WARFARIN SODIUM 2 MG PO TABS
4.0000 mg | ORAL_TABLET | Freq: Once | ORAL | Status: AC
  Administered 2017-01-11: 4 mg via ORAL
  Filled 2017-01-11: qty 2

## 2017-01-11 MED ORDER — FUROSEMIDE 10 MG/ML IJ SOLN
40.0000 mg | Freq: Every day | INTRAMUSCULAR | Status: DC
  Administered 2017-01-12 – 2017-01-13 (×2): 40 mg via INTRAVENOUS
  Filled 2017-01-11 (×3): qty 4

## 2017-01-11 NOTE — Progress Notes (Signed)
PROGRESS NOTE    GUIDO COMP  DJS:[id number] DOB: 01-01-1941 DOA: 01/05/2017 PCP: Wardell Honour, MD   Brief Narrative:  76 year old male with history of COPD and tobacco abuse, came to the emergency room with complaints of shortness of breath. Found to have COPD exacerbation and tested positive for influenza. He also had rapid atrial fibrillation requiring Cardizem infusion. Started on steroids, bronchodilators and antibiotics. Respiratory status has been slow to improve. Rapid atrial fibrillation being driven by respiratory status. Cardiology following. Also has AKI on CKD3 and nephrology is following. Slowly improving respiratory wise and Cr steadily trending down very slowly.   Assessment & Plan:   Active Problems:   Hypertension   Tobacco abuse   Stage III chronic kidney disease   COPD exacerbation (HCC)   Atrial fibrillation with RVR (HCC)   Acute respiratory failure with hypoxia (HCC)   AKI (acute kidney injury) (HCC)   Influenza A   Respiratory distress   SOB (shortness of breath)  1. Acute respiratory failure with hypoxia related to COPD exacerbation and Influenza A.  -We'll start to wean off oxygen as his clinical condition improves.  -Maintain O2 Saturations >92% -Will likely need 6 minute walk screen prior to D/C when stable.   2. COPD exacerbation.  -Continue the patient on IV Methylprednisolone 60 mg q6h, D/C'd Abx with Levofloxacin 500 mg po q48h. C/w bronchodilators with Xopenex 0.63 mg Neb 4 Times Daily and 0.63 mg q2hprn and Ipratropium 0.5 mg Neb 4 times daily.  -Continues to wheeze and is short of breath. Continue pulmonary hygiene. -C/w Dulera 2 puff IH BID and Gauifenesin 1200 po BID -C/w Pantoprazole 40 mg po Daily while on IV Steroids   3. Atrial fibrillation with RVR.  -Likely precipitated by underlying respiratory issues.  -He does take Metoprolol at home, which is being held for now due to significant bronchospasms. He also takes diltiazem.    -Off of Dilitazem gtt and currently now on Diltazem 90 mg po q6h and Bisoprolol increased from 2.5 mg po BID to 5 mg po BID -Blood pressures are low making further titration of medications challenging.  -Digoxin not good option due to renal issues.  -Cardiology following and appreciate Recc's -He is anticoagulated with Coumadin and continue Coumadin per Pharmacy. TSH normal. Echocardiogram unremarkable, EF 60-65%.  4. AKI on CKD3.  -BUN/Cr now 86/3.80 from 90/3.88 -Nephrology following and appreciate Recc's.  -C/w IV fluids at 125 mL/hr and IV Lasix 40 mg BID changed to IV 40 mg Daily.  -Follow Strict Urine output.   5. Hypertension.  -Blood pressures currently stable.  -Continue current treatments with Diltiazem and Bisoprolol.    6. Tobacco use.  -Counseled on importance of tobacco cessation.  7. Influenza A.  -C/w Droplet Precautions -S/p Oseltamivir 30 mg po Daily x 5 days because of AKI  8. Hyperglycemia. -Likely related to IV Steroids. -HbA1c was 6.0.  -CBG's have been running from 129-206 -C/w Moderate Novolog SSI AC HS  9. Leukocytosis  -Improved -WBC went from 14.4 -> 12.1 -> 8.2 -> 7.8 -Repeat CBC in AM  10. Hyponatremia -Slowly trending down went from 135 -> 133 -> 131 -> 133 -Likely from IV Diuresis with Lasix -Continue to Trend CMP's  DVT prophylaxis: Anticoagulated with Coumadin Code Status: Partial Code, no CPR, No defibrilliation Family Communication: No family present at bedside Disposition Plan: Likely Home at D/C; Will obtain PT evaluation and if improved in AM transfer to Medical Floor with Telemetry  Consultants:  Cardiology  Nephrology  Procedures:   ECHOCARDIOGRAM 01/05/17 Study Conclusions  - Left ventricle: The cavity size was normal. Systolic function was   normal. The estimated ejection fraction was in the range of 60%   to 65%. Wall motion was normal; there were no regional wall   motion abnormalities. The study was not  technically sufficient to   allow evaluation of LV diastolic dysfunction due to atrial   fibrillation. Mild concentric and moderate focal basal septal   hypertrophy. - Aortic valve: Trileaflet; mildly calcified leaflets. There was no   stenosis. - Mitral valve: Moderately calcified annulus. - Left atrium: The atrium was mildly dilated.   Antimicrobials:  Anti-infectives    Start     Dose/Rate Route Frequency Ordered Stop   01/09/17 1400  levofloxacin (LEVAQUIN) tablet 500 mg  Status:  Discontinued     500 mg Oral Every 48 hours 01/08/17 1345 01/11/17 1807   01/05/17 2200  oseltamivir (TAMIFLU) capsule 75 mg  Status:  Discontinued     75 mg Oral 2 times daily 01/05/17 1836 01/05/17 1848   01/05/17 2000  oseltamivir (TAMIFLU) capsule 30 mg     30 mg Oral Daily 01/05/17 1848 01/09/17 1954   01/05/17 1400  levofloxacin (LEVAQUIN) tablet 750 mg  Status:  Discontinued     750 mg Oral Every 48 hours 01/05/17 1333 01/08/17 1345     Subjective: States he feels ok today and is not as bad. Thinks his breathing is better. No nausea or vomiting.  Objective: Vitals:   01/11/17 1100 01/11/17 1135 01/11/17 1147 01/11/17 1719  BP: 121/76     Pulse: 96  (!) 102   Resp: (!) 5  17   Temp:      TempSrc:      SpO2: 94% 95% 97% 93%  Weight:      Height:        Intake/Output Summary (Last 24 hours) at 01/11/17 1812 Last data filed at 01/11/17 1650  Gross per 24 hour  Intake             3720 ml  Output             2875 ml  Net              845 ml   Filed Weights   01/07/17 0500 01/08/17 0400 01/09/17 0500  Weight: 76.3 kg (168 lb 3.4 oz) 77 kg (169 lb 12.1 oz) 79.9 kg (176 lb 2.4 oz)   Examination: Physical Exam:  Constitutional: Appears calm and comfortable and in NAD but a little sleepy Eyes: Sclerae anicteric  ENMT: External Ears, Nose appear normal. Grossly normal hearing.  Neck: Appears normal, supple, no cervical masses, normal ROM, no appreciable thyromegaly or JVD Respiratory:  Diminished to auscultation bilaterally with some expiratory wheezing and rhonchus breath sounds. Slightly increased respiratory effort. No accessory muscle use. Wearing Supplemental O2 via [place].  Cardiovascular: Irregularly Irregular, no murmurs / rubs / gallops. S1 and S2 auscultated. Mild extremity edema.  Abdomen: Soft, non-tender, non-distended. No masses palpated. No appreciable hepatosplenomegaly. Bowel sounds positive x4.  GU: Deferred. Musculoskeletal: No clubbing / cyanosis of digits/nails. No joint deformity upper and lower extremities.  Skin: No rashes, lesions, ulcers on limited skin eval. No induration; Warm and dry.  Neurologic: CN 2-12 grossly intact with no focal deficits. Romberg sign cerebellar reflexes not assessed.  Psychiatric: Normal judgment and insight. Alert and oriented x 3. Normal mood and appropriate affect.   Data Reviewed: I  have personally reviewed following labs and imaging studies  CBC:  Recent Labs Lab 01/05/17 0846 01/06/17 0430 01/07/17 0533 01/08/17 0505 01/10/17 0422 01/11/17 0441  WBC 5.9 9.5 14.4* 12.1* 8.2 7.8  NEUTROABS 4.8  --   --   --  7.4 7.0  HGB 14.0 11.8* 12.1* 12.3* 11.8* 12.0*  HCT 40.9 33.0* 34.2* 35.2* 32.8* 33.2*  MCV 99.0 97.1 97.2 97.5 95.3 94.6  PLT 152 150 161 163 156 128   Basic Metabolic Panel:  Recent Labs Lab 01/07/17 0533 01/08/17 0505 01/09/17 0401 01/10/17 0422 01/11/17 0441  NA 136 135 133* 131* 133*  K 4.1 4.2 4.4 4.5 3.9  CL 102 102 100* 98* 101  CO2 '25 23 23 22 '$ 21*  GLUCOSE 200* 213* 235* 234* 211*  BUN 67* 81* 88* 90* 86*  CREATININE 3.18* 3.71* 3.90* 3.88* 3.80*  CALCIUM 8.5* 8.7* 8.3* 8.1* 7.8*  MG  --   --   --   --  1.3*  PHOS  --   --  4.4 4.8* 5.1*   GFR: Estimated Creatinine Clearance: 16.3 mL/min (by C-G formula based on SCr of 3.8 mg/dL (H)). Liver Function Tests:  Recent Labs Lab 01/05/17 0846 01/09/17 0401 01/10/17 0422 01/11/17 0441  AST 47*  --   --  41  ALT 18  --   --  33    ALKPHOS 56  --   --  48  BILITOT 1.0  --   --  0.7  PROT 6.3*  --   --  4.8*  ALBUMIN 3.2* 2.6* 2.5* 2.3*   No results for input(s): LIPASE, AMYLASE in the last 168 hours. No results for input(s): AMMONIA in the last 168 hours. Coagulation Profile:  Recent Labs Lab 01/07/17 0533 01/08/17 0505 01/09/17 0401 01/10/17 0422 01/11/17 0441  INR 2.53 3.10 3.25 3.05 2.58   Cardiac Enzymes:  Recent Labs Lab 01/05/17 0846 01/05/17 1434 01/05/17 1933 01/06/17 0119  TROPONINI 0.03* 0.03* 0.04* 0.03*   BNP (last 3 results) No results for input(s): PROBNP in the last 8760 hours. HbA1C:  Recent Labs  01/09/17 0401  HGBA1C 6.0*   CBG:  Recent Labs Lab 01/10/17 1658 01/10/17 2157 01/11/17 0830 01/11/17 1138 01/11/17 1733  GLUCAP 257* 129* 202* 179* 206*   Lipid Profile: No results for input(s): CHOL, HDL, LDLCALC, TRIG, CHOLHDL, LDLDIRECT in the last 72 hours. Thyroid Function Tests: No results for input(s): TSH, T4TOTAL, FREET4, T3FREE, THYROIDAB in the last 72 hours. Anemia Panel: No results for input(s): VITAMINB12, FOLATE, FERRITIN, TIBC, IRON, RETICCTPCT in the last 72 hours. Sepsis Labs: No results for input(s): PROCALCITON, LATICACIDVEN in the last 168 hours.  Recent Results (from the past 240 hour(s))  MRSA PCR Screening     Status: None   Collection Time: 01/05/17  1:34 PM  Result Value Ref Range Status   MRSA by PCR NEGATIVE NEGATIVE Final    Comment:        The GeneXpert MRSA Assay (FDA approved for NASAL specimens only), is one component of a comprehensive MRSA colonization surveillance program. It is not intended to diagnose MRSA infection nor to guide or monitor treatment for MRSA infections.     Radiology Studies: No results found.  Scheduled Meds: . atorvastatin  20 mg Oral q1800  . bisoprolol  5 mg Oral BID  . diltiazem  90 mg Oral Q6H  . DULoxetine  60 mg Oral Daily  . [START ON 01/12/2017] furosemide  40 mg Intravenous Daily  .  guaiFENesin  1,200 mg Oral BID  . insulin aspart  0-15 Units Subcutaneous TID WC  . insulin aspart  0-5 Units Subcutaneous QHS  . ipratropium  0.5 mg Nebulization QID  . iron polysaccharides  150 mg Oral Daily  . levalbuterol  0.63 mg Nebulization QID  . mouth rinse  15 mL Mouth Rinse BID  . methylPREDNISolone (SOLU-MEDROL) injection  60 mg Intravenous Q6H  . mometasone-formoterol  2 puff Inhalation BID  . omega-3 acid ethyl esters  2 g Oral BID  . pantoprazole  40 mg Oral Daily  . polyethylene glycol  17 g Oral Daily  . terazosin  2 mg Oral QHS  . Warfarin - Pharmacist Dosing Inpatient   Does not apply Q24H   Continuous Infusions: . sodium chloride 125 mL/hr at 01/11/17 1748    LOS: 6 days   Kerney Elbe, DO [facility] Pager [phone]  If 7PM-7AM, please contact night-coverage [email or web address] Password [email or web address] 01/11/2017, 6:12 PM

## 2017-01-11 NOTE — Patient Outreach (Addendum)
[facility]) Care Management  01/11/2017  Don [name] 24-May-1941 [id number]  Referral from hospital liaison to follow patient upon discharge for Transition of Care. Patient is currently inpatient status.  Plan: Will follow up with patient after discharge to home.   [name], RN BSN [place] Management Coordinator [facility] Care Management  [phone]

## 2017-01-11 NOTE — Progress Notes (Signed)
Progress Note  Patient Name: Don [name] Date of Encounter: 01/11/2017  Primary Cardiologist: Dr. Minus Breeding  Subjective   Still with intermittent cough and chest congestion.  Inpatient Medications    Scheduled Meds: . atorvastatin  20 mg Oral q1800  . bisoprolol  2.5 mg Oral BID  . diltiazem  90 mg Oral Q6H  . DULoxetine  60 mg Oral Daily  . [START ON 01/12/2017] furosemide  40 mg Intravenous Daily  . guaiFENesin  1,200 mg Oral BID  . insulin aspart  0-15 Units Subcutaneous TID WC  . insulin aspart  0-5 Units Subcutaneous QHS  . ipratropium  0.5 mg Nebulization QID  . iron polysaccharides  150 mg Oral Daily  . levalbuterol  0.63 mg Nebulization QID  . levofloxacin  500 mg Oral Q48H  . mouth rinse  15 mL Mouth Rinse BID  . methylPREDNISolone (SOLU-MEDROL) injection  60 mg Intravenous Q6H  . mometasone-formoterol  2 puff Inhalation BID  . omega-3 acid ethyl esters  2 g Oral BID  . pantoprazole  40 mg Oral Daily  . polyethylene glycol  17 g Oral Daily  . terazosin  2 mg Oral QHS  . Warfarin - Pharmacist Dosing Inpatient   Does not apply Q24H   Continuous Infusions: . sodium chloride 125 mL/hr at 01/11/17 0124   PRN Meds: acetaminophen **OR** acetaminophen, clonazePAM, levalbuterol, nitroGLYCERIN, ondansetron **OR** ondansetron (ZOFRAN) IV, polyvinyl alcohol   Vital Signs    Vitals:   01/11/17 0400 01/11/17 0500 01/11/17 0600 01/11/17 0755  BP:  (!) 112/55 103/62   Pulse:  (!) 105 (!) 108   Resp:  (!) 22 18   Temp: 97.7 F (36.5 C)     TempSrc: Oral     SpO2:  94% 94% 95%  Weight:      Height:        Intake/Output Summary (Last 24 hours) at 01/11/17 0809 Last data filed at 01/11/17 0600  Gross per 24 hour  Intake             3485 ml  Output             4000 ml  Net             -515 ml   Filed Weights   01/07/17 0500 01/08/17 0400 01/09/17 0500  Weight: 168 lb 3.4 oz (76.3 kg) 169 lb 12.1 oz (77 kg) 176 lb 2.4 oz (79.9 kg)    Telemetry      Atrial fibrillation, rare PVCs. Personally reviewed.  Physical Exam   GEN: Elderly male, no distress.   Neck: No JVD Cardiac:  Irregularly irregular, distant, no gallop. Respiratory: . Coarse scattered rhonchi, forced expiratory wheeze in the upper airways.  GI: Soft, nontender, non-distended  MS: No edema; No deformity.   Labs    Chemistry Recent Labs Lab 01/05/17 0846  01/09/17 0401 01/10/17 0422 01/11/17 0441  NA 135  < > 133* 131* 133*  K 4.8  < > 4.4 4.5 3.9  CL 98*  < > 100* 98* 101  CO2 28  < > 23 22 21*  GLUCOSE 110*  < > 235* 234* 211*  BUN 48*  < > 88* 90* 86*  CREATININE 3.07*  < > 3.90* 3.88* 3.80*  CALCIUM 8.9  < > 8.3* 8.1* 7.8*  PROT 6.3*  --   --   --  4.8*  ALBUMIN 3.2*  --  2.6* 2.5* 2.3*  AST 47*  --   --   --  41  ALT 18  --   --   --  33  ALKPHOS 56  --   --   --  48  BILITOT 1.0  --   --   --  0.7  GFRNONAA 18*  < > 14* 14* 14*  GFRAA 21*  < > 16* 16* 16*  ANIONGAP 9  < > '10 11 11  '$ < > = values in this interval not displayed.   Hematology  Recent Labs Lab 01/08/17 0505 01/10/17 0422 01/11/17 0441  WBC 12.1* 8.2 7.8  RBC 3.61* 3.44* 3.51*  HGB 12.3* 11.8* 12.0*  HCT 35.2* 32.8* 33.2*  MCV 97.5 95.3 94.6  MCH 34.1* 34.3* 34.2*  MCHC 34.9 36.0 36.1*  RDW 13.9 13.4 13.1  PLT 163 156 158    Cardiac Enzymes  Recent Labs Lab 01/05/17 0846 01/05/17 1434 01/05/17 1933 01/06/17 0119  TROPONINI 0.03* 0.03* 0.04* 0.03*   No results for input(s): TROPIPOC in the last 168 hours.    Radiology    Chest x-ray 01/05/2017: FINDINGS: Normal cardiac silhouette. No effusion, infiltrate, or pneumothorax. Patient rotated rightward. No acute osseous abnormality.  IMPRESSION: No acute cardiopulmonary process.  Cardiac Studies   Echocardiogram 01/05/2017: Study Conclusions  - Left ventricle: The cavity size was normal. Systolic function was   normal. The estimated ejection fraction was in the range of 60%   to 65%. Wall motion was  normal; there were no regional wall   motion abnormalities. The study was not technically sufficient to   allow evaluation of LV diastolic dysfunction due to atrial   fibrillation. Mild concentric and moderate focal basal septal   hypertrophy. - Aortic valve: Trileaflet; mildly calcified leaflets. There was no   stenosis. - Mitral valve: Moderately calcified annulus. - Left atrium: The atrium was mildly dilated.  Patient Profile     76 y.o. male with chronic atrial fibrillation on Coumadin, previously documented cardiomyopathy with LVEF 45% (now normal), COPD with tobacco abuse, and CKD stage III, we are following for atrial fib with RVR. He has an active COPD exacerbation and influenza A.  Assessment & Plan    1. Chronic atrial fibrillation, recent RVR. He is now on high-dose oral diltiazem with bisoprolol recently added which he is tolerating so far from a pulmonary perspective. Continues on Coumadin for stroke prophylaxis with management per pharmacy. INR 2.58.  2. History of cardiomyopathy, LVEF 60-65% by recent echocardiogram.  3. COPD exacerbation.  4. CKD, stage 3-4. Creatinine 3.8.  5. Influenza A.  Plan to increase bisoprolol to 5 mg twice daily and continue diltiazem 90 mg Q6 hours. Continue Coumadin per pharmacy.  Signed, Rozann Lesches, MD  01/11/2017, 8:09 AM

## 2017-01-11 NOTE — Plan of Care (Signed)
Problem: Safety: Goal: Ability to remain free from injury will improve Outcome: Progressing Patient is very weak, and experiencing dyspnea with exertion. When patient uses [facility], RN ensures all cords are out of patient way and the [facility] is as close as possible. RN makes patient focus on his breathing and maintaining his balance.   Patient occasionally tries to do tasks without any help, but RN expressed to him that it is necessary for him to ask and receive help until he gains his strength back and dyspnea decreases. Adequate lighting is used at all times and environment is clutter free.  Problem: Health Behavior/Discharge Planning: Goal: Ability to manage health-related needs will improve Outcome: Progressing Patient will most likely need home O2 at discharge. He will also need a walking aid. Possible home health care needs as well.   Problem: Pain Managment: Goal: General experience of comfort will improve Outcome: Progressing Patient has been free of pain. Anxiety has decreased.

## 2017-01-11 NOTE — Progress Notes (Signed)
[place] for Coumadin (chronic Rx PTA) Indication: atrial fibrillation  Allergies  Allergen Reactions  . Wellbutrin [Bupropion] Anxiety    Patient Measurements: Height: '5\' 9"'$  (175.3 cm) Weight: 176 lb 2.4 oz (79.9 kg) IBW/kg (Calculated) : 70.7  Vital Signs: Temp: 97.7 F (36.5 C) (03/01 0400) Temp Source: Oral (03/01 0400) BP: 103/62 (03/01 0600) Pulse Rate: 108 (03/01 0600)  Labs:  Recent Labs  01/09/17 0401 01/10/17 0422 01/11/17 0441  HGB  --  11.8* 12.0*  HCT  --  32.8* 33.2*  PLT  --  156 158  LABPROT 33.9* 32.2* 28.2*  INR 3.25 3.05 2.58  CREATININE 3.90* 3.88* 3.80*    Estimated Creatinine Clearance: 16.3 mL/min (by C-G formula based on SCr of 3.8 mg/dL (H)).   Medical History: Past Medical History:  Diagnosis Date  . Acute bronchitis 04/03/2014  . Anxiety   . Atrial fibrillation ([place])   . Cataract   . COPD (chronic obstructive pulmonary disease) ([place])   . Essential hypertension   . Hyperlipidemia   . Macular degeneration   . Noncompliance with medications 04/2013   Xarelto, digoxin previously  . Pre-diabetes   . Prostate cancer ([place])   . Stage III chronic kidney disease 04/03/2014  . Tubular adenoma of colon 07/31/02, 11/18/03    Medications:  Prescriptions Prior to Admission  Medication Sig Dispense Refill Last Dose  . albuterol (PROVENTIL HFA;VENTOLIN HFA) 108 (90 BASE) MCG/ACT inhaler Inhale 2 puffs into the lungs every 6 (six) hours as needed for wheezing or shortness of breath. 1 Inhaler 2 Past Week at Unknown time  . ALLERGY RELIEF 10 MG tablet TAKE 1 TABLET DAILY FOR ALLERGY 30 tablet 5 Past Week at Unknown time  . atorvastatin (LIPITOR) 20 MG tablet TAKE 1 TABLET DAILY 30 tablet 2 Past Week at Unknown time  . budesonide-formoterol (SYMBICORT) 160-4.5 MCG/ACT inhaler Inhale 2 puffs into the lungs 2 (two) times daily. 1 Inhaler 3 Past Week at Unknown time  . clonazePAM (KLONOPIN) 1 MG tablet TAKE 1/2  TABLET ONCE DAILY AS NEEDED FOR ANXIETY 15 tablet 0 Past Week at Unknown time  . diltiazem (CARDIZEM CD) 300 MG 24 hr capsule TAKE (1) CAPSULE DAILY 30 capsule 1 Past Week at Unknown time  . doxycycline (ADOXA) 100 MG tablet Take 1 tablet (100 mg total) by mouth 2 (two) times daily. 20 tablet 0 Past Week at Unknown time  . doxycycline (VIBRA-TABS) 100 MG tablet Take 1 tablet by mouth 2 (two) times daily.   Past Week at Unknown time  . doxylamine, Sleep, (UNISOM) 25 MG tablet Take 25 mg by mouth at bedtime as needed.   Past Week at Unknown time  . DULoxetine (CYMBALTA) 60 MG capsule TAKE (1) CAPSULE DAILY 30 capsule 1 Past Week at Unknown time  . FERREX 150 150 MG capsule Take 1 capsule by mouth daily.   Past Week at Unknown time  . furosemide (LASIX) 40 MG tablet Take 1 tablet by mouth every other day.    Past Week at Unknown time  . metoprolol tartrate (LOPRESSOR) 25 MG tablet TAKE (1) TABLET TWICE A DAY. 60 tablet 4 Past Week at Unknown time  . Multiple Vitamin (MULTIVITAMIN WITH MINERALS) TABS Take 1 tablet by mouth daily.   Past Week at Unknown time  . nitroGLYCERIN (NITROSTAT) 0.4 MG SL tablet Place 1 tablet (0.4 mg total) under the tongue every 5 (five) minutes as needed for chest pain. 25 tablet 2 Past Week  at Unknown time  . omega-3 acid ethyl esters (LOVAZA) 1 G capsule TAKE (2) CAPSULES TWICE DAILY. 120 capsule 2 Past Week at Unknown time  . pantoprazole (PROTONIX) 40 MG tablet TAKE 1 TABLET DAILY 30 tablet 2 Past Week at Unknown time  . Polyethyl Glycol-Propyl Glycol (SYSTANE OP) Place 1 drop into both eyes 2 (two) times daily as needed (dry eyes).    Past Week at Unknown time  . Probiotic Product (PRO-BIOTIC BLEND) CAPS Take 1 capsule by mouth daily.   Past Week at Unknown time  . terazosin (HYTRIN) 2 MG capsule TAKE 1 CAPSULE AT BEDTIME 30 capsule 4 Past Week at Unknown time  . tiotropium (SPIRIVA) 18 MCG inhalation capsule Place 1 capsule (18 mcg total) into inhaler and inhale daily. 30  capsule 3 Past Week at Unknown time  . triamcinolone cream (KENALOG) 0.1 % Apply 1 application topically 2 (two) times daily. 80 g 0 Past Week at Unknown time  . warfarin (COUMADIN) 4 MG tablet Take 1 and 1/2 tablets (= '6mg'$ ) daily except on Sundays and Thursdays take 1 tablet (='4mg'$ ) 45 tablet 0 Past Week at Unknown time  . Incontinence Supply Disposable (INCONTINENCE BRIEF LARGE) MISC Use as needed for urinary incontinence.  Dx: urinary incontinence R32 and prostate cancer C61 200 each 1 Taking   Assessment: 76yo male on chronic Coumadin PTA.  INR is therapeutic today.  Goal of Therapy:  INR 2-3   Plan:  Coumadin 4 mg po today. INR daily  Don [name] 01/11/2017,9:05 AM

## 2017-01-11 NOTE — Care Management Note (Signed)
Case Management Note  Patient Details  Name: ANYELO MCCUE MRN: [id number] Date of Birth: [birth year]   If discussed at [place] Length of Stay Meetings, dates discussed:  01/11/2017  Additional Comments:  Dionisios Ricci, Chauncey [place], RN 01/11/2017, 2:51 PM

## 2017-01-11 NOTE — Progress Notes (Signed)
Subjective: Interval History: The patient is feeling better. He denies any difficulty breathing. He has some cough but no sputum production.  Objective: Vital signs in last 24 hours: Temp:  [97.5 F (36.4 C)-99 F (37.2 C)] 97.7 F (36.5 C) (03/01 0400) Pulse Rate:  [87-114] 108 (03/01 0600) Resp:  [10-41] 18 (03/01 0600) BP: (99-133)/(55-116) 103/62 (03/01 0600) SpO2:  [92 %-99 %] 95 % (03/01 0755) Weight change:   Intake/Output from previous day: 02/28 0701 - 03/01 0700 In: 3485 [P.O.:360; I.V.:3125] Out: 4000 [Urine:4000] Intake/Output this shift: No intake/output data recorded.  General appearance: alert, cooperative and no distress Resp: diminished breath sounds bilaterally and wheezes bilaterally Cardio: regular rate and rhythm Extremities: No edema  Lab Results:  Recent Labs  01/10/17 0422 01/11/17 0441  WBC 8.2 7.8  HGB 11.8* 12.0*  HCT 32.8* 33.2*  PLT 156 158   BMET:   Recent Labs  01/10/17 0422 01/11/17 0441  NA 131* 133*  K 4.5 3.9  CL 98* 101  CO2 22 21*  GLUCOSE 234* 211*  BUN 90* 86*  CREATININE 3.88* 3.80*  CALCIUM 8.1* 7.8*   No results for input(s): PTH in the last 72 hours. Iron Studies: No results for input(s): IRON, TIBC, TRANSFERRIN, FERRITIN in the last 72 hours.  Studies/Results: No results found.  I have reviewed the patient's current medications.  Assessment/Plan: Problem #1 acute kidney injury superimposed on chronic.  Possibly secondary to ATN versus cardiorenal. Patient presently is asymptomatic.His renal function is progressively improving. His creatinine however is above his baseline. Problem #2 chronic renal failure: Possibly early stage IV versus late stage III. Problem #3 difficulty breathing: Possibly a combination of influenza and exacerbation of COPD. patient is improving. Presently is on Lasix and patient had 4 L of urine output. Problem #4 metabolic bone disease: His calcium and phosphorus is range. Problem #5  hypertension: His blood pressure is reasonably controlled Problem #6 history of a trial fibrillation: Patient on Coumadin. His heart rate seems to be better controlled. Problem #7 hyponatremia: Possibly hypovolemic hyponatremia sodium is 133 improving. Plan: 1] will continue with normal saline at 125 mL/h 2] will decrease to Lasix 40 mg IV oncea day 3 will check his renal panel in the morning.    LOS: 6 days   Don [name] S 01/11/2017,8:01 AM

## 2017-01-12 LAB — RENAL FUNCTION PANEL
ANION GAP: 10 (ref 5–15)
Albumin: 2.2 g/dL — ABNORMAL LOW (ref 3.5–5.0)
BUN: 84 mg/dL — AB (ref 6–20)
CALCIUM: 7.9 mg/dL — AB (ref 8.9–10.3)
CO2: 20 mmol/L — AB (ref 22–32)
Chloride: 106 mmol/L (ref 101–111)
Creatinine, Ser: 3.35 mg/dL — ABNORMAL HIGH (ref 0.61–1.24)
GFR calc Af Amer: 19 mL/min — ABNORMAL LOW (ref >60)
GFR calc non Af Amer: 16 mL/min — ABNORMAL LOW (ref >60)
GLUCOSE: 174 mg/dL — AB (ref 65–99)
POTASSIUM: 4 mmol/L (ref 3.5–5.1)
Phosphorus: 4.1 mg/dL (ref 2.5–4.6)
SODIUM: 136 mmol/L (ref 135–145)

## 2017-01-12 LAB — CBC WITH DIFFERENTIAL/PLATELET
Basophils Absolute: 0 10*3/uL (ref 0.0–0.1)
Basophils Relative: 0 %
EOS ABS: 0 10*3/uL (ref 0.0–0.7)
EOS PCT: 0 %
HCT: 33.5 % — ABNORMAL LOW (ref 39.0–52.0)
HEMOGLOBIN: 12.1 g/dL — AB (ref 13.0–17.0)
LYMPHS PCT: 4 %
Lymphs Abs: 0.3 10*3/uL — ABNORMAL LOW (ref 0.7–4.0)
MCH: 34.2 pg — ABNORMAL HIGH (ref 26.0–34.0)
MCHC: 36.1 g/dL — AB (ref 30.0–36.0)
MCV: 94.6 fL (ref 78.0–100.0)
MONOS PCT: 4 %
Monocytes Absolute: 0.3 10*3/uL (ref 0.1–1.0)
NEUTROS PCT: 92 %
Neutro Abs: 7.4 10*3/uL (ref 1.7–7.7)
Platelets: 173 10*3/uL (ref 150–400)
RBC: 3.54 MIL/uL — AB (ref 4.22–5.81)
RDW: 13 % (ref 11.5–15.5)
WBC: 8 10*3/uL (ref 4.0–10.5)

## 2017-01-12 LAB — GLUCOSE, CAPILLARY
GLUCOSE-CAPILLARY: 248 mg/dL — AB (ref 65–99)
Glucose-Capillary: 198 mg/dL — ABNORMAL HIGH (ref 65–99)
Glucose-Capillary: 201 mg/dL — ABNORMAL HIGH (ref 65–99)
Glucose-Capillary: 184 mg/dL — ABNORMAL HIGH (ref 65–99)

## 2017-01-12 LAB — PROTIME-INR
INR: 2.17
PROTHROMBIN TIME: 24.5 s — AB (ref 11.4–15.2)

## 2017-01-12 MED ORDER — SENNOSIDES-DOCUSATE SODIUM 8.6-50 MG PO TABS
1.0000 | ORAL_TABLET | Freq: Two times a day (BID) | ORAL | Status: DC
  Administered 2017-01-12 – 2017-01-18 (×10): 1 via ORAL
  Filled 2017-01-12 (×11): qty 1

## 2017-01-12 MED ORDER — BISACODYL 10 MG RE SUPP: 10.0000 mg | Freq: Once | RECTAL | Status: DC

## 2017-01-12 MED ORDER — WARFARIN SODIUM 1 MG PO TABS
6.0000 mg | ORAL_TABLET | Freq: Once | ORAL | Status: AC
  Administered 2017-01-12: 17:00:00 6 mg via ORAL
  Filled 2017-01-12: qty 1

## 2017-01-12 MED ORDER — POLYETHYLENE GLYCOL 3350 17 G PO PACK
17.0000 g | PACK | Freq: Two times a day (BID) | ORAL | Status: DC
  Administered 2017-01-12 – 2017-01-18 (×5): 17 g via ORAL
  Filled 2017-01-12 (×9): qty 1

## 2017-01-12 NOTE — Progress Notes (Signed)
[place] for Coumadin (chronic Rx PTA) Indication: atrial fibrillation  Allergies  Allergen Reactions  . Wellbutrin [Bupropion] Anxiety    Patient Measurements: Height: '5\' 9"'$  (175.3 cm) Weight: 176 lb 2.4 oz (79.9 kg) IBW/kg (Calculated) : 70.7  Vital Signs: Temp: 97.7 F (36.5 C) (03/02 0812) Temp Source: Oral (03/02 0812) BP: 117/77 (03/02 1000) Pulse Rate: 92 (03/02 1000)  Labs:  Recent Labs  01/10/17 0422 01/11/17 0441 01/12/17 0422  HGB 11.8* 12.0* 12.1*  HCT 32.8* 33.2* 33.5*  PLT 156 158 173  LABPROT 32.2* 28.2* 24.5*  INR 3.05 2.58 2.17  CREATININE 3.88* 3.80* 3.35*    Estimated Creatinine Clearance: 18.5 mL/min (by C-G formula based on SCr of 3.35 mg/dL (H)).   Medical History: Past Medical History:  Diagnosis Date  . Acute bronchitis 04/03/2014  . Anxiety   . Atrial fibrillation ([place])   . Cataract   . COPD (chronic obstructive pulmonary disease) ([place])   . Essential hypertension   . Hyperlipidemia   . Macular degeneration   . Noncompliance with medications 04/2013   Xarelto, digoxin previously  . Pre-diabetes   . Prostate cancer ([place])   . Stage III chronic kidney disease 04/03/2014  . Tubular adenoma of colon 07/31/02, 11/18/03    Medications:  Prescriptions Prior to Admission  Medication Sig Dispense Refill Last Dose  . albuterol (PROVENTIL HFA;VENTOLIN HFA) 108 (90 BASE) MCG/ACT inhaler Inhale 2 puffs into the lungs every 6 (six) hours as needed for wheezing or shortness of breath. 1 Inhaler 2 Past Week at Unknown time  . ALLERGY RELIEF 10 MG tablet TAKE 1 TABLET DAILY FOR ALLERGY 30 tablet 5 Past Week at Unknown time  . atorvastatin (LIPITOR) 20 MG tablet TAKE 1 TABLET DAILY 30 tablet 2 Past Week at Unknown time  . budesonide-formoterol (SYMBICORT) 160-4.5 MCG/ACT inhaler Inhale 2 puffs into the lungs 2 (two) times daily. 1 Inhaler 3 Past Week at Unknown time  . clonazePAM (KLONOPIN) 1 MG tablet TAKE 1/2  TABLET ONCE DAILY AS NEEDED FOR ANXIETY 15 tablet 0 Past Week at Unknown time  . diltiazem (CARDIZEM CD) 300 MG 24 hr capsule TAKE (1) CAPSULE DAILY 30 capsule 1 Past Week at Unknown time  . doxycycline (ADOXA) 100 MG tablet Take 1 tablet (100 mg total) by mouth 2 (two) times daily. 20 tablet 0 Past Week at Unknown time  . doxycycline (VIBRA-TABS) 100 MG tablet Take 1 tablet by mouth 2 (two) times daily.   Past Week at Unknown time  . doxylamine, Sleep, (UNISOM) 25 MG tablet Take 25 mg by mouth at bedtime as needed.   Past Week at Unknown time  . DULoxetine (CYMBALTA) 60 MG capsule TAKE (1) CAPSULE DAILY 30 capsule 1 Past Week at Unknown time  . FERREX 150 150 MG capsule Take 1 capsule by mouth daily.   Past Week at Unknown time  . furosemide (LASIX) 40 MG tablet Take 1 tablet by mouth every other day.    Past Week at Unknown time  . metoprolol tartrate (LOPRESSOR) 25 MG tablet TAKE (1) TABLET TWICE A DAY. 60 tablet 4 Past Week at Unknown time  . Multiple Vitamin (MULTIVITAMIN WITH MINERALS) TABS Take 1 tablet by mouth daily.   Past Week at Unknown time  . nitroGLYCERIN (NITROSTAT) 0.4 MG SL tablet Place 1 tablet (0.4 mg total) under the tongue every 5 (five) minutes as needed for chest pain. 25 tablet 2 Past Week at Unknown time  . omega-3  acid ethyl esters (LOVAZA) 1 G capsule TAKE (2) CAPSULES TWICE DAILY. 120 capsule 2 Past Week at Unknown time  . pantoprazole (PROTONIX) 40 MG tablet TAKE 1 TABLET DAILY 30 tablet 2 Past Week at Unknown time  . Polyethyl Glycol-Propyl Glycol (SYSTANE OP) Place 1 drop into both eyes 2 (two) times daily as needed (dry eyes).    Past Week at Unknown time  . Probiotic Product (PRO-BIOTIC BLEND) CAPS Take 1 capsule by mouth daily.   Past Week at Unknown time  . terazosin (HYTRIN) 2 MG capsule TAKE 1 CAPSULE AT BEDTIME 30 capsule 4 Past Week at Unknown time  . tiotropium (SPIRIVA) 18 MCG inhalation capsule Place 1 capsule (18 mcg total) into inhaler and inhale daily. 30  capsule 3 Past Week at Unknown time  . triamcinolone cream (KENALOG) 0.1 % Apply 1 application topically 2 (two) times daily. 80 g 0 Past Week at Unknown time  . warfarin (COUMADIN) 4 MG tablet Take 1 and 1/2 tablets (= '6mg'$ ) daily except on Sundays and Thursdays take 1 tablet (='4mg'$ ) 45 tablet 0 Past Week at Unknown time  . Incontinence Supply Disposable (INCONTINENCE BRIEF LARGE) MISC Use as needed for urinary incontinence.  Dx: urinary incontinence R32 and prostate cancer C61 200 each 1 Taking   Assessment: 76yo male on chronic Coumadin PTA.  INR is therapeutic today at 2.17.   Goal of Therapy:  INR 2-3   Plan:  Coumadin 6 mg po today. PT-INR daily Monitor for s/s of bleeding  Isac Sarna, BS Vena [place], BCPS Clinical Pharmacist Pager [phone] 01/12/2017,10:53 AM

## 2017-01-12 NOTE — Care Management Important Message (Signed)
Important Message  Patient Details  Name: Don [name] MRN: [id number] Date of Birth: 1941/04/09   Medicare Important Message Given:  Yes    Carin Shipp, Chauncey [place], RN 01/12/2017, 12:26 PM

## 2017-01-12 NOTE — Progress Notes (Deleted)
**Note De-Identified  Obfuscation** Patient removed from BIPAP and placed onto 3L HFNC; tolerating well.  RRT to continue to monitor.

## 2017-01-12 NOTE — Progress Notes (Signed)
Subjective: Interval History: No marked change from yesterday. He slept good overnight. Complains of some weakness.  Objective: Vital signs in last 24 hours: Temp:  [97.7 F (36.5 C)-97.8 F (36.6 C)] 97.7 F (36.5 C) (03/02 0812) Pulse Rate:  [83-118] 103 (03/02 0700) Resp:  [0-33] 24 (03/02 0700) BP: (103-131)/(66-114) 131/85 (03/02 0700) SpO2:  [89 %-99 %] 94 % (03/02 0739) Weight change:   Intake/Output from previous day: 03/01 0701 - 03/02 0700 In: 3585 [P.O.:960; I.V.:2625] Out: 1475 [Urine:1475] Intake/Output this shift: No intake/output data recorded.  General appearance: alert, cooperative and no distress Resp: diminished breath sounds bilaterally and wheezes bilaterally Cardio: regular rate and rhythm Extremities: No edema  Lab Results:  Recent Labs  01/11/17 0441 01/12/17 0422  WBC 7.8 8.0  HGB 12.0* 12.1*  HCT 33.2* 33.5*  PLT 158 173   BMET:   Recent Labs  01/11/17 0441 01/12/17 0422  NA 133* 136  K 3.9 4.0  CL 101 106  CO2 21* 20*  GLUCOSE 211* 174*  BUN 86* 84*  CREATININE 3.80* 3.35*  CALCIUM 7.8* 7.9*   No results for input(s): PTH in the last 72 hours. Iron Studies: No results for input(s): IRON, TIBC, TRANSFERRIN, FERRITIN in the last 72 hours.  Studies/Results: No results found.  I have reviewed the patient's current medications.  Assessment/Plan: Problem #1 acute kidney injury superimposed on chronic.  Possibly secondary to ATN versus cardiorenal. His renal function continued to improve. Patient presently is asymptomatic. Problem #2 chronic renal failure: Possibly early stage IV versus late stage III. Problem #3 difficulty breathing: Possibly a combination of influenza and exacerbation of COPD. patient is improving. Patient is still on Lasix but frequency was decreased to once a day.[name] He continued to be non-oliguric and had 1400 mL of urine output. Problem #4 metabolic bone disease: His calcium and phosphorus is range. Problem #5  hypertension: His blood pressure is reasonably controlled Problem #6 history of a trial fibrillation: Patient on Coumadin. His heart rate seems to be better controlled. Problem #7 hyponatremia: Possibly hypovolemic hyponatremia sodium has corrected. Plan: 1] will continue with present management           2] will check his renal panel in the morning.    LOS: 7 days   Saed Hudlow S 01/12/2017,8:36 AM

## 2017-01-12 NOTE — Progress Notes (Signed)
PROGRESS NOTE    Don [name]  ZMO:[id number] DOB: 12/20/1940 DOA: 01/05/2017 PCP: Wardell Honour, MD   Brief Narrative:  76 year old male with history of COPD and tobacco abuse, came to the emergency room with complaints of shortness of breath. Found to have COPD exacerbation and tested positive for influenza. He also had rapid atrial fibrillation requiring Cardizem infusion. Started on steroids, bronchodilators and antibiotics. Respiratory status has been slow to improve. Rapid atrial fibrillation being driven by respiratory status. Cardiology following. Also has AKI on CKD3 and nephrology is following. Slowly improving respiratory wise and Cr steadily trending down very slowly.   Assessment & Plan:   Active Problems:   Hypertension   Tobacco abuse   Stage III chronic kidney disease   COPD exacerbation (HCC)   Atrial fibrillation with RVR (HCC)   Acute respiratory failure with hypoxia (HCC)   AKI (acute kidney injury) (HCC)   Influenza A   Respiratory distress   SOB (shortness of breath)  1. Acute respiratory failure with hypoxia related to COPD exacerbation and Influenza A.  -We'll start to wean off oxygen as his clinical condition improves.  -Maintain O2 Saturations >92% -Will likely need 6 minute walk screen prior to D/C when stable.   2. COPD exacerbation.  -Continue the patient on IV Methylprednisolone 60 mg q6h, D/C'd Abx with Levofloxacin 500 mg po q48h. C/w bronchodilators with Xopenex 0.63 mg Neb 4 Times Daily and 0.63 mg q2hprn and Ipratropium 0.5 mg Neb 4 times daily.  -Continues to wheeze and is short of breath. Continue pulmonary hygiene. -C/w Dulera 2 puff IH BID and Gauifenesin 1200 po BID -C/w Pantoprazole 40 mg po Daily while on IV Steroids   3. Atrial fibrillation with RVR.  -Likely precipitated by underlying respiratory issues.  -He does take Metoprolol at home, which is being held for now due to significant bronchospasms. He also takes diltiazem.    -C/w Diltazem 90 mg po q6h and Bisoprolol 5 mg po BID -Blood pressures are low making further titration of medications challenging.  -Digoxin not good option due to renal issues.  -Cardiology following and appreciate Recc's -He is anticoagulated with Coumadin and continue Coumadin per Pharmacy (they will give him 6 mg of Coumadin tonight as PT was 24.5 and INR was 2.17) - TSH normal. Echocardiogram unremarkable, EF 60-65%.  4. AKI on CKD3.  -BUN/Cr trending down from 90/3.88 -> 84/3.35 -Nephrology following and appreciate Recc's.  -C/w IV fluids at 125 mL/hr and IV Lasix 40 mg Daily.  -Follow Strict Urine output.   5. Hypertension.  -Blood pressures currently stable.  -Continue current treatments with Diltiazem and Bisoprolol.    6. Tobacco use.  -Counseled on importance of tobacco cessation.  7. Influenza A.  -C/w Droplet Precautions -S/p Oseltamivir 30 mg po Daily x 5 days because of AKI  8. Hyperglycemia. -Likely related to IV Steroids. -HbA1c was 6.0.  -CBG's have been running from 198-248 -C/w Moderate Novolog SSI AC Hs; May Increase to Resistant Scale  9. Leukocytosis  -Improved -WBC went from 14.4 -> 12.1 -> 8.2 -> 7.8 -> 8.0 -Repeat CBC in AM  10. Hyponatremia, improved -Improved to 136 -Likely from IV Diuresis with Lasix -Continue to Trend CMP's  11. Constipation -Scheduled Miralax 17 grams po BID -Started Senna-Doucsate 1 tab po BID -Will try Bisacodyl Suppository  DVT prophylaxis: Anticoagulated with Coumadin Code Status: Partial Code, no CPR, No defibrilliation Family Communication: No family present at bedside Disposition Plan: Likely Home at D/C; Will  obtain PT evaluation and if improved in AM transfer to Medical Floor with Telemetry  Consultants:   Cardiology  Nephrology  Procedures:   ECHOCARDIOGRAM 01/05/17 Study Conclusions  - Left ventricle: The cavity size was normal. Systolic function was   normal. The estimated ejection  fraction was in the range of 60%   to 65%. Wall motion was normal; there were no regional wall   motion abnormalities. The study was not technically sufficient to   allow evaluation of LV diastolic dysfunction due to atrial   fibrillation. Mild concentric and moderate focal basal septal   hypertrophy. - Aortic valve: Trileaflet; mildly calcified leaflets. There was no   stenosis. - Mitral valve: Moderately calcified annulus. - Left atrium: The atrium was mildly dilated.   Antimicrobials:  Anti-infectives    Start     Dose/Rate Route Frequency Ordered Stop   01/09/17 1400  levofloxacin (LEVAQUIN) tablet 500 mg  Status:  Discontinued     500 mg Oral Every 48 hours 01/08/17 1345 01/11/17 1807   01/05/17 2200  oseltamivir (TAMIFLU) capsule 75 mg  Status:  Discontinued     75 mg Oral 2 times daily 01/05/17 1836 01/05/17 1848   01/05/17 2000  oseltamivir (TAMIFLU) capsule 30 mg     30 mg Oral Daily 01/05/17 1848 01/09/17 1954   01/05/17 1400  levofloxacin (LEVAQUIN) tablet 750 mg  Status:  Discontinued     750 mg Oral Every 48 hours 01/05/17 1333 01/08/17 1345     Subjective: States he feels Breathing could be better. Has not had a bowel movement in several days. No Nausea or Vomiting.   Objective: Vitals:   01/12/17 1400 01/12/17 1545 01/12/17 1600 01/12/17 1700  BP: 109/78  136/84 135/82  Pulse: 93  91 (!) 104  Resp: 17  20 (!) 27  Temp:   97.7 F (36.5 C)   TempSrc:   Oral   SpO2: 95% 99% 98% 94%  Weight:      Height:        Intake/Output Summary (Last 24 hours) at 01/12/17 1759 Last data filed at 01/12/17 0913  Gross per 24 hour  Intake             1500 ml  Output             1700 ml  Net             -200 ml   Filed Weights   01/07/17 0500 01/08/17 0400 01/09/17 0500  Weight: 76.3 kg (168 lb 3.4 oz) 77 kg (169 lb 12.1 oz) 79.9 kg (176 lb 2.4 oz)   Examination: Physical Exam:  Constitutional: Appears calm and comfortable and in NAD but a little sleepy Eyes:  Sclerae anicteric  ENMT: External Ears, Nose appear normal. Grossly normal hearing.  Neck: Appears normal, supple, no cervical masses, normal ROM, no appreciable thyromegaly or JVD Respiratory: Diminished to auscultation bilaterally with some expiratory wheezing and rhonchus breath sounds. Slightly increased respiratory effort. No accessory muscle use. Wearing Supplemental O2 via [place].  Cardiovascular: Irregularly Irregular, no murmurs / rubs / gallops. S1 and S2 auscultated. Mild extremity edema.  Abdomen: Soft, non-tender, non-distended. No masses palpated. No appreciable hepatosplenomegaly. Bowel sounds positive x4.  GU: Deferred. Musculoskeletal: No clubbing / cyanosis of digits/nails. No joint deformity upper and lower extremities.  Skin: No rashes, lesions, ulcers on limited skin eval. No induration; Warm and dry.  Neurologic: CN 2-12 grossly intact with no focal deficits. Romberg sign cerebellar reflexes not  assessed.  Psychiatric: Normal judgment and insight. Alert and oriented x 3. Normal mood and appropriate affect.   Data Reviewed: I have personally reviewed following labs and imaging studies  CBC:  Recent Labs Lab 01/07/17 0533 01/08/17 0505 01/10/17 0422 01/11/17 0441 01/12/17 0422  WBC 14.4* 12.1* 8.2 7.8 8.0  NEUTROABS  --   --  7.4 7.0 7.4  HGB 12.1* 12.3* 11.8* 12.0* 12.1*  HCT 34.2* 35.2* 32.8* 33.2* 33.5*  MCV 97.2 97.5 95.3 94.6 94.6  PLT 161 163 156 158 400   Basic Metabolic Panel:  Recent Labs Lab 01/08/17 0505 01/09/17 0401 01/10/17 0422 01/11/17 0441 01/12/17 0422  NA 135 133* 131* 133* 136  K 4.2 4.4 4.5 3.9 4.0  CL 102 100* 98* 101 106  CO2 '23 23 22 '$ 21* 20*  GLUCOSE 213* 235* 234* 211* 174*  BUN 81* 88* 90* 86* 84*  CREATININE 3.71* 3.90* 3.88* 3.80* 3.35*  CALCIUM 8.7* 8.3* 8.1* 7.8* 7.9*  MG  --   --   --  1.3*  --   PHOS  --  4.4 4.8* 5.1* 4.1   GFR: Estimated Creatinine Clearance: 18.5 mL/min (by C-G formula based on SCr of 3.35 mg/dL  (H)). Liver Function Tests:  Recent Labs Lab 01/09/17 0401 01/10/17 0422 01/11/17 0441 01/12/17 0422  AST  --   --  41  --   ALT  --   --  33  --   ALKPHOS  --   --  48  --   BILITOT  --   --  0.7  --   PROT  --   --  4.8*  --   ALBUMIN 2.6* 2.5* 2.3* 2.2*   No results for input(s): LIPASE, AMYLASE in the last 168 hours. No results for input(s): AMMONIA in the last 168 hours. Coagulation Profile:  Recent Labs Lab 01/08/17 0505 01/09/17 0401 01/10/17 0422 01/11/17 0441 01/12/17 0422  INR 3.10 3.25 3.05 2.58 2.17   Cardiac Enzymes:  Recent Labs Lab 01/05/17 1933 01/06/17 0119  TROPONINI 0.04* 0.03*   BNP (last 3 results) No results for input(s): PROBNP in the last 8760 hours. HbA1C: No results for input(s): HGBA1C in the last 72 hours. CBG:  Recent Labs Lab 01/11/17 1733 01/11/17 2135 01/12/17 0757 01/12/17 1106 01/12/17 1634  GLUCAP 206* 202* 198* 248* 201*   Lipid Profile: No results for input(s): CHOL, HDL, LDLCALC, TRIG, CHOLHDL, LDLDIRECT in the last 72 hours. Thyroid Function Tests: No results for input(s): TSH, T4TOTAL, FREET4, T3FREE, THYROIDAB in the last 72 hours. Anemia Panel: No results for input(s): VITAMINB12, FOLATE, FERRITIN, TIBC, IRON, RETICCTPCT in the last 72 hours. Sepsis Labs: No results for input(s): PROCALCITON, LATICACIDVEN in the last 168 hours.  Recent Results (from the past 240 hour(s))  MRSA PCR Screening     Status: None   Collection Time: 01/05/17  1:34 PM  Result Value Ref Range Status   MRSA by PCR NEGATIVE NEGATIVE Final    Comment:        The GeneXpert MRSA Assay (FDA approved for NASAL specimens only), is one component of a comprehensive MRSA colonization surveillance program. It is not intended to diagnose MRSA infection nor to guide or monitor treatment for MRSA infections.     Radiology Studies: No results found.  Scheduled Meds: . atorvastatin  20 mg Oral q1800  . bisoprolol  5 mg Oral BID  .  diltiazem  90 mg Oral Q6H  . DULoxetine  60 mg Oral Daily  .  furosemide  40 mg Intravenous Daily  . guaiFENesin  1,200 mg Oral BID  . insulin aspart  0-15 Units Subcutaneous TID WC  . insulin aspart  0-5 Units Subcutaneous QHS  . ipratropium  0.5 mg Nebulization QID  . iron polysaccharides  150 mg Oral Daily  . levalbuterol  0.63 mg Nebulization QID  . mouth rinse  15 mL Mouth Rinse BID  . methylPREDNISolone (SOLU-MEDROL) injection  60 mg Intravenous Q6H  . mometasone-formoterol  2 puff Inhalation BID  . omega-3 acid ethyl esters  2 g Oral BID  . pantoprazole  40 mg Oral Daily  . polyethylene glycol  17 g Oral Daily  . terazosin  2 mg Oral QHS  . Warfarin - Pharmacist Dosing Inpatient   Does not apply Q24H   Continuous Infusions: . sodium chloride 125 mL/hr at 01/11/17 1748    LOS: 7 days   Kerney Elbe, DO [facility] Pager [phone]  If 7PM-7AM, please contact night-coverage [email or web address] Password [email or web address] 01/12/2017, 5:59 PM

## 2017-01-12 NOTE — Progress Notes (Signed)
Progress Note  Patient Name: Don [name] Date of Encounter: 01/12/2017  Primary Cardiologist: Dr. Minus Breeding  Subjective   Intermittent cough, feels weak. No chest pain or palpitations.  Inpatient Medications    Scheduled Meds: . atorvastatin  20 mg Oral q1800  . bisoprolol  5 mg Oral BID  . diltiazem  90 mg Oral Q6H  . DULoxetine  60 mg Oral Daily  . furosemide  40 mg Intravenous Daily  . guaiFENesin  1,200 mg Oral BID  . insulin aspart  0-15 Units Subcutaneous TID WC  . insulin aspart  0-5 Units Subcutaneous QHS  . ipratropium  0.5 mg Nebulization QID  . iron polysaccharides  150 mg Oral Daily  . levalbuterol  0.63 mg Nebulization QID  . mouth rinse  15 mL Mouth Rinse BID  . methylPREDNISolone (SOLU-MEDROL) injection  60 mg Intravenous Q6H  . mometasone-formoterol  2 puff Inhalation BID  . omega-3 acid ethyl esters  2 g Oral BID  . pantoprazole  40 mg Oral Daily  . polyethylene glycol  17 g Oral Daily  . terazosin  2 mg Oral QHS  . Warfarin - Pharmacist Dosing Inpatient   Does not apply Q24H   Continuous Infusions: . sodium chloride 125 mL/hr at 01/11/17 1748   PRN Meds: acetaminophen **OR** acetaminophen, clonazePAM, levalbuterol, nitroGLYCERIN, ondansetron **OR** ondansetron (ZOFRAN) IV, polyvinyl alcohol   Vital Signs    Vitals:   01/12/17 0700 01/12/17 0714 01/12/17 0739 01/12/17 0812  BP: 131/85     Pulse: (!) 103     Resp: (!) 24     Temp:    97.7 F (36.5 C)  TempSrc:    Oral  SpO2: (!) 89% 91% 94%   Weight:      Height:        Intake/Output Summary (Last 24 hours) at 01/12/17 0821 Last data filed at 01/12/17 0600  Gross per 24 hour  Intake             3345 ml  Output             1475 ml  Net             1870 ml   Filed Weights   01/07/17 0500 01/08/17 0400 01/09/17 0500  Weight: 168 lb 3.4 oz (76.3 kg) 169 lb 12.1 oz (77 kg) 176 lb 2.4 oz (79.9 kg)    Telemetry    Atrial fibrillation, rare PVCs. Personally reviewed.  Physical  Exam   GEN: Elderly male, no distress.   Neck: No JVD Cardiac:  Irregularly irregular, distant, no gallop. Respiratory: . Coarse scattered rhonchi, forced expiratory wheeze in the upper airways.  GI: Soft, nontender, non-distended  MS: No edema; No deformity.  Labs    Chemistry Recent Labs Lab 01/05/17 0846  01/10/17 0422 01/11/17 0441 01/12/17 0422  NA 135  < > 131* 133* 136  K 4.8  < > 4.5 3.9 4.0  CL 98*  < > 98* 101 106  CO2 28  < > 22 21* 20*  GLUCOSE 110*  < > 234* 211* 174*  BUN 48*  < > 90* 86* 84*  CREATININE 3.07*  < > 3.88* 3.80* 3.35*  CALCIUM 8.9  < > 8.1* 7.8* 7.9*  PROT 6.3*  --   --  4.8*  --   ALBUMIN 3.2*  < > 2.5* 2.3* 2.2*  AST 47*  --   --  41  --   ALT 18  --   --  33  --   ALKPHOS 56  --   --  48  --   BILITOT 1.0  --   --  0.7  --   GFRNONAA 18*  < > 14* 14* 16*  GFRAA 21*  < > 16* 16* 19*  ANIONGAP 9  < > '11 11 10  '$ < > = values in this interval not displayed.   Hematology  Recent Labs Lab 01/08/17 0505 01/10/17 0422 01/11/17 0441  WBC 12.1* 8.2 7.8  RBC 3.61* 3.44* 3.51*  HGB 12.3* 11.8* 12.0*  HCT 35.2* 32.8* 33.2*  MCV 97.5 95.3 94.6  MCH 34.1* 34.3* 34.2*  MCHC 34.9 36.0 36.1*  RDW 13.9 13.4 13.1  PLT 163 156 158    Cardiac Enzymes  Recent Labs Lab 01/05/17 0846 01/05/17 1434 01/05/17 1933 01/06/17 0119  TROPONINI 0.03* 0.03* 0.04* 0.03*   No results for input(s): TROPIPOC in the last 168 hours.    Radiology    Chest x-ray 01/05/2017: FINDINGS: Normal cardiac silhouette. No effusion, infiltrate, or pneumothorax. Patient rotated rightward. No acute osseous abnormality.  IMPRESSION: No acute cardiopulmonary process.  Cardiac Studies   Echocardiogram 01/05/2017: Study Conclusions  - Left ventricle: The cavity size was normal. Systolic function was   normal. The estimated ejection fraction was in the range of 60%   to 65%. Wall motion was normal; there were no regional wall   motion abnormalities. The study  was not technically sufficient to   allow evaluation of LV diastolic dysfunction due to atrial   fibrillation. Mild concentric and moderate focal basal septal   hypertrophy. - Aortic valve: Trileaflet; mildly calcified leaflets. There was no   stenosis. - Mitral valve: Moderately calcified annulus. - Left atrium: The atrium was mildly dilated.  Patient Profile     76 y.o. male with chronic atrial fibrillation on Coumadin, previously documented cardiomyopathy with LVEF 45% (now normal), COPD with tobacco abuse, and CKD stage III, we are following for atrial fib with RVR. He has an active COPD exacerbation and influenza A.  Assessment & Plan    1. Chronic atrial fibrillation, recent RVR in setting of acute illness. He is on diltiazem 90 mg Q6 hours and bisoprolol 5 mg BID (beta 1 selective to reduce wheezing). Continues on Coumadin for stroke prophylaxis with management per pharmacy. INR 2.17.  2. History of cardiomyopathy, LVEF 60-65% by recent echocardiogram.  3. COPD exacerbation.  4. CKD, stage 3-4. Creatinine down to 3.4..  5. Influenza A.  Continue current cardiac regimen including bisoprolol 5 mg twice daily and diltiazem 90 mg Q6 hours. Continue Coumadin per pharmacy. Hopefully with further improvement in pulmonary status, heart rate control will stabilize. At that point could switch to long-acting diltiazem CD.  Signed, Rozann Lesches, MD  01/12/2017, 8:21 AM

## 2017-01-13 DIAGNOSIS — K5900 Constipation, unspecified: Secondary | ICD-10-CM

## 2017-01-13 LAB — RENAL FUNCTION PANEL
ANION GAP: 10 (ref 5–15)
Albumin: 2.2 g/dL — ABNORMAL LOW (ref 3.5–5.0)
BUN: 78 mg/dL — AB (ref 6–20)
CO2: 20 mmol/L — AB (ref 22–32)
Calcium: 8.1 mg/dL — ABNORMAL LOW (ref 8.9–10.3)
Chloride: 108 mmol/L (ref 101–111)
Creatinine, Ser: 3.02 mg/dL — ABNORMAL HIGH (ref 0.61–1.24)
GFR calc Af Amer: 21 mL/min — ABNORMAL LOW (ref >60)
GFR calc non Af Amer: 19 mL/min — ABNORMAL LOW (ref >60)
GLUCOSE: 210 mg/dL — AB (ref 65–99)
POTASSIUM: 3.6 mmol/L (ref 3.5–5.1)
Phosphorus: 4.3 mg/dL (ref 2.5–4.6)
SODIUM: 138 mmol/L (ref 135–145)

## 2017-01-13 LAB — COMPREHENSIVE METABOLIC PANEL
ALT: 30 U/L (ref 17–63)
AST: 30 U/L (ref 15–41)
Albumin: 2.3 g/dL — ABNORMAL LOW (ref 3.5–5.0)
Alkaline Phosphatase: 48 U/L (ref 38–126)
Anion gap: 10 (ref 5–15)
BILIRUBIN TOTAL: 0.9 mg/dL (ref 0.3–1.2)
BUN: 79 mg/dL — AB (ref 6–20)
CO2: 19 mmol/L — ABNORMAL LOW (ref 22–32)
Calcium: 8.1 mg/dL — ABNORMAL LOW (ref 8.9–10.3)
Chloride: 108 mmol/L (ref 101–111)
Creatinine, Ser: 3.09 mg/dL — ABNORMAL HIGH (ref 0.61–1.24)
GFR, EST AFRICAN AMERICAN: 21 mL/min — AB (ref >60)
GFR, EST NON AFRICAN AMERICAN: 18 mL/min — AB (ref >60)
Glucose, Bld: 208 mg/dL — ABNORMAL HIGH (ref 65–99)
POTASSIUM: 3.6 mmol/L (ref 3.5–5.1)
Sodium: 137 mmol/L (ref 135–145)
TOTAL PROTEIN: 4.7 g/dL — AB (ref 6.5–8.1)

## 2017-01-13 LAB — CBC WITH DIFFERENTIAL/PLATELET
Basophils Absolute: 0 10*3/uL (ref 0.0–0.1)
Basophils Relative: 0 %
EOS PCT: 0 %
Eosinophils Absolute: 0 10*3/uL (ref 0.0–0.7)
HEMATOCRIT: 34.8 % — AB (ref 39.0–52.0)
Hemoglobin: 12.4 g/dL — ABNORMAL LOW (ref 13.0–17.0)
LYMPHS PCT: 4 %
Lymphs Abs: 0.3 10*3/uL — ABNORMAL LOW (ref 0.7–4.0)
MCH: 33.8 pg (ref 26.0–34.0)
MCHC: 35.6 g/dL (ref 30.0–36.0)
MCV: 94.8 fL (ref 78.0–100.0)
MONO ABS: 0.4 10*3/uL (ref 0.1–1.0)
MONOS PCT: 4 %
NEUTROS ABS: 7.9 10*3/uL — AB (ref 1.7–7.7)
Neutrophils Relative %: 92 %
PLATELETS: 201 10*3/uL (ref 150–400)
RBC: 3.67 MIL/uL — ABNORMAL LOW (ref 4.22–5.81)
RDW: 13.3 % (ref 11.5–15.5)
WBC: 8.6 10*3/uL (ref 4.0–10.5)

## 2017-01-13 LAB — PROTIME-INR
INR: 2.2
PROTHROMBIN TIME: 24.8 s — AB (ref 11.4–15.2)

## 2017-01-13 LAB — MAGNESIUM: MAGNESIUM: 1.3 mg/dL — AB (ref 1.7–2.4)

## 2017-01-13 LAB — PHOSPHORUS: PHOSPHORUS: 4.3 mg/dL (ref 2.5–4.6)

## 2017-01-13 LAB — GLUCOSE, CAPILLARY
GLUCOSE-CAPILLARY: 200 mg/dL — AB (ref 65–99)
GLUCOSE-CAPILLARY: 225 mg/dL — AB (ref 65–99)
Glucose-Capillary: 198 mg/dL — ABNORMAL HIGH (ref 65–99)
Glucose-Capillary: 211 mg/dL — ABNORMAL HIGH (ref 65–99)

## 2017-01-13 MED ORDER — FUROSEMIDE 10 MG/ML IJ SOLN
20.0000 mg | Freq: Once | INTRAMUSCULAR | Status: AC
  Administered 2017-01-13: 20 mg via INTRAVENOUS
  Filled 2017-01-13: qty 2

## 2017-01-13 MED ORDER — WARFARIN SODIUM 5 MG PO TABS
6.0000 mg | ORAL_TABLET | Freq: Once | ORAL | Status: AC
  Administered 2017-01-13: 17:00:00 6 mg via ORAL
  Filled 2017-01-13: qty 1

## 2017-01-13 NOTE — Progress Notes (Signed)
PROGRESS NOTE    Don [name]  ZOX:[id number] DOB: November 19, 1940 DOA: 01/05/2017 PCP: Wardell Honour, MD   Brief Narrative:  76 year old male with history of COPD and tobacco abuse, came to the emergency room with complaints of shortness of breath. Found to have COPD exacerbation and tested positive for influenza. He also had rapid atrial fibrillation requiring Cardizem infusion. Started on steroids, bronchodilators and antibiotics. Respiratory status has been slow to improve. Rapid atrial fibrillation being driven by respiratory status. Cardiology following. Also has AKI on CKD3 and nephrology is following. Slowly improving respiratory wise and Cr steadily trending down very slowly.  Assessment & Plan:   Active Problems:   Hypertension   Tobacco abuse   Stage III chronic kidney disease   COPD exacerbation (HCC)   Atrial fibrillation with RVR (HCC)   Acute respiratory failure with hypoxia (HCC)   AKI (acute kidney injury) (HCC)   Influenza A   Respiratory distress   SOB (shortness of breath)  1. Acute respiratory failure with hypoxia related to COPD exacerbation and Influenza A. stable -We'll start to wean off oxygen as his clinical condition improves.  -Maintain O2 Saturations >92% -Will likely need 6 minute walk screen prior to D/C when stable.   2. COPD exacerbation.  -Continue the patient on IV Methylprednisolone 60 mg q6h, D/C'd Abx with Levofloxacin 500 mg po q48h. C/w bronchodilators with Xopenex 0.63 mg Neb 4 Times Daily and 0.63 mg q2hprn and Ipratropium 0.5 mg Neb 4 times daily.  -Continues to wheeze and is short of breath. Continue pulmonary hygiene. -C/w Dulera 2 puff IH BID and Gauifenesin 1200 po BID -C/w Pantoprazole 40 mg po Daily while on IV Steroids   3. Atrial fibrillation with RVR.  -Likely precipitated by underlying respiratory issues.  -He does take Metoprolol at home, which is being held for now due to significant bronchospasms. He also takes diltiazem.   -C/w Diltazem 90 mg po q6h and Bisoprolol 5 mg po BID; Cardiology recommends switching to long-acting diltiazem CD when further improvement in Pulmonary Status -Blood pressures are low making further titration of medications challenging.  -Digoxin not good option due to renal issues.  -Cardiology following and appreciate Recc's -He is anticoagulated with Coumadin and continue Coumadin per Pharmacy (they will give him 6 mg of Coumadin tonight as PT was 24.8 and INR was 2.20) - TSH normal. Echocardiogram unremarkable, EF 60-65%.  4. AKI on CKD3.  -BUN/Cr trending down from 90/3.88 -> 84/3.35 -> 78/3.02 -Nephrology following and appreciate Recc's.  -IV fluids at 125 mL/hr D/C'd and will continue IV Lasix 40 mg Daily. (recieved IV 20 mg Lasix last night) -Follow Strict Urine output.  -Repeat CMP in AM  5. Hypertension.  -Blood pressures currently stable.  -Continue current treatments with Diltiazem and Bisoprolol.    6. Tobacco use.  -Counseled on importance of tobacco cessation.  7. Influenza A.  -C/w Droplet Precautions -S/p Oseltamivir 30 mg po Daily x 5 days because of AKI  8. Hyperglycemia. -Likely related to IV Steroids. -HbA1c was 6.0.  -CBG's have been running from 184-211 -C/w Moderate Novolog SSI AC Hs; May Increase to Resistant Scale  9. Leukocytosis  -Improved -WBC went from 14.4 -> 12.1 -> 8.2 -> 7.8 -> 8.0 -> 8.6 -Repeat CBC in AM  10. Hyponatremia, improved -Improved to 138 -Likely from Hypovolemia -Continue to Trend CMP's  11. Constipation, improved -Scheduled Miralax 17 grams po BID -Started Senna-Doucsate 1 tab po BID -Will try Bisacodyl Suppository  DVT prophylaxis:  Anticoagulated with Coumadin Code Status: Partial Code, no CPR, No defibrilliation Family Communication: No family present at bedside Disposition Plan: Likely Home at D/C; Will obtain PT evaluation prior to D/C and if improved in AM transfer to Medical Floor with  Telemetry  Consultants:   Cardiology  Nephrology  Procedures:   ECHOCARDIOGRAM 01/05/17 Study Conclusions  - Left ventricle: The cavity size was normal. Systolic function was   normal. The estimated ejection fraction was in the range of 60%   to 65%. Wall motion was normal; there were no regional wall   motion abnormalities. The study was not technically sufficient to   allow evaluation of LV diastolic dysfunction due to atrial   fibrillation. Mild concentric and moderate focal basal septal   hypertrophy. - Aortic valve: Trileaflet; mildly calcified leaflets. There was no   stenosis. - Mitral valve: Moderately calcified annulus. - Left atrium: The atrium was mildly dilated.   Antimicrobials:  Anti-infectives    Start     Dose/Rate Route Frequency Ordered Stop   01/09/17 1400  levofloxacin (LEVAQUIN) tablet 500 mg  Status:  Discontinued     500 mg Oral Every 48 hours 01/08/17 1345 01/11/17 1807   01/05/17 2200  oseltamivir (TAMIFLU) capsule 75 mg  Status:  Discontinued     75 mg Oral 2 times daily 01/05/17 1836 01/05/17 1848   01/05/17 2000  oseltamivir (TAMIFLU) capsule 30 mg     30 mg Oral Daily 01/05/17 1848 01/09/17 1954   01/05/17 1400  levofloxacin (LEVAQUIN) tablet 750 mg  Status:  Discontinued     750 mg Oral Every 48 hours 01/05/17 1333 01/08/17 1345     Subjective: States he feels better sitting at bedside. Had a bowel movement yesterday. No Nausea or vomiting. Still feels slightly SOB.   Objective: Vitals:   01/13/17 1600 01/13/17 1635 01/13/17 1700 01/13/17 1800  BP: (!) 132/97  122/74 124/76  Pulse:   (!) 101 (!) 103  Resp: 14 (!) 0 (!) 9 (!) 0  Temp:  97.7 F (36.5 C)    TempSrc:  Oral    SpO2:   96% 96%  Weight:      Height:        Intake/Output Summary (Last 24 hours) at 01/13/17 1836 Last data filed at 01/13/17 1719  Gross per 24 hour  Intake              360 ml  Output              750 ml  Net             -390 ml   Filed Weights    01/08/17 0400 01/09/17 0500 01/13/17 0548  Weight: 77 kg (169 lb 12.1 oz) 79.9 kg (176 lb 2.4 oz) 84.4 kg (186 lb 1.1 oz)   Examination: Physical Exam:  Constitutional: Appears calm and comfortable and in NAD but a little sleepy Eyes: Sclerae anicteric  ENMT: External Ears, Nose appear normal. Grossly normal hearing.  Neck: Appears normal, supple, no cervical masses, normal ROM, no appreciable thyromegaly or JVD Respiratory: Diminished to auscultation bilaterally with some expiratory wheezing and rhonchus breath sounds. Slightly increased respiratory effort. No accessory muscle use. Wearing Supplemental O2 via [place].  Cardiovascular: Irregularly Irregular, no murmurs / rubs / gallops. S1 and S2 auscultated. Mild extremity edema.  Abdomen: Soft, non-tender, non-distended. No masses palpated. No appreciable hepatosplenomegaly. Bowel sounds positive x4.  GU: Deferred. Musculoskeletal: No clubbing / cyanosis of digits/nails. No joint deformity  upper and lower extremities.  Skin: No rashes, lesions, ulcers on limited skin eval. No induration; Warm and dry.  Neurologic: CN 2-12 grossly intact with no focal deficits. Romberg sign cerebellar reflexes not assessed.  Psychiatric: Normal judgment and insight. Alert and oriented x 3. Normal mood and appropriate affect.   Data Reviewed: I have personally reviewed following labs and imaging studies  CBC:  Recent Labs Lab 01/08/17 0505 01/10/17 0422 01/11/17 0441 01/12/17 0422 01/13/17 0540  WBC 12.1* 8.2 7.8 8.0 8.6  NEUTROABS  --  7.4 7.0 7.4 7.9*  HGB 12.3* 11.8* 12.0* 12.1* 12.4*  HCT 35.2* 32.8* 33.2* 33.5* 34.8*  MCV 97.5 95.3 94.6 94.6 94.8  PLT 163 156 158 173 564   Basic Metabolic Panel:  Recent Labs Lab 01/09/17 0401 01/10/17 0422 01/11/17 0441 01/12/17 0422 01/13/17 0540  NA 133* 131* 133* 136 137  138  K 4.4 4.5 3.9 4.0 3.6  3.6  CL 100* 98* 101 106 108  108  CO2 23 22 21* 20* 19*  20*  GLUCOSE 235* 234* 211* 174* 208*   210*  BUN 88* 90* 86* 84* 79*  78*  CREATININE 3.90* 3.88* 3.80* 3.35* 3.09*  3.02*  CALCIUM 8.3* 8.1* 7.8* 7.9* 8.1*  8.1*  MG  --   --  1.3*  --  1.3*  PHOS 4.4 4.8* 5.1* 4.1 4.3  4.3   GFR: Estimated Creatinine Clearance: 20.5 mL/min (by C-G formula based on SCr of 3.02 mg/dL (H)). Liver Function Tests:  Recent Labs Lab 01/09/17 0401 01/10/17 0422 01/11/17 0441 01/12/17 0422 01/13/17 0540  AST  --   --  41  --  30  ALT  --   --  33  --  30  ALKPHOS  --   --  48  --  48  BILITOT  --   --  0.7  --  0.9  PROT  --   --  4.8*  --  4.7*  ALBUMIN 2.6* 2.5* 2.3* 2.2* 2.3*  2.2*   No results for input(s): LIPASE, AMYLASE in the last 168 hours. No results for input(s): AMMONIA in the last 168 hours. Coagulation Profile:  Recent Labs Lab 01/09/17 0401 01/10/17 0422 01/11/17 0441 01/12/17 0422 01/13/17 0540  INR 3.25 3.05 2.58 2.17 2.20   Cardiac Enzymes: No results for input(s): CKTOTAL, CKMB, CKMBINDEX, TROPONINI in the last 168 hours. BNP (last 3 results) No results for input(s): PROBNP in the last 8760 hours. HbA1C: No results for input(s): HGBA1C in the last 72 hours. CBG:  Recent Labs Lab 01/12/17 1634 01/12/17 2119 01/13/17 0711 01/13/17 1119 01/13/17 1634  GLUCAP 201* 184* 198* 200* 211*   Lipid Profile: No results for input(s): CHOL, HDL, LDLCALC, TRIG, CHOLHDL, LDLDIRECT in the last 72 hours. Thyroid Function Tests: No results for input(s): TSH, T4TOTAL, FREET4, T3FREE, THYROIDAB in the last 72 hours. Anemia Panel: No results for input(s): VITAMINB12, FOLATE, FERRITIN, TIBC, IRON, RETICCTPCT in the last 72 hours. Sepsis Labs: No results for input(s): PROCALCITON, LATICACIDVEN in the last 168 hours.  Recent Results (from the past 240 hour(s))  MRSA PCR Screening     Status: None   Collection Time: 01/05/17  1:34 PM  Result Value Ref Range Status   MRSA by PCR NEGATIVE NEGATIVE Final    Comment:        The GeneXpert MRSA Assay (FDA approved  for NASAL specimens only), is one component of a comprehensive MRSA colonization surveillance program. It is not intended to  diagnose MRSA infection nor to guide or monitor treatment for MRSA infections.     Radiology Studies: No results found.  Scheduled Meds: . atorvastatin  20 mg Oral q1800  . bisacodyl  10 mg Rectal Once  . bisoprolol  5 mg Oral BID  . diltiazem  90 mg Oral Q6H  . DULoxetine  60 mg Oral Daily  . furosemide  40 mg Intravenous Daily  . guaiFENesin  1,200 mg Oral BID  . insulin aspart  0-15 Units Subcutaneous TID WC  . insulin aspart  0-5 Units Subcutaneous QHS  . ipratropium  0.5 mg Nebulization QID  . iron polysaccharides  150 mg Oral Daily  . levalbuterol  0.63 mg Nebulization QID  . mouth rinse  15 mL Mouth Rinse BID  . methylPREDNISolone (SOLU-MEDROL) injection  60 mg Intravenous Q6H  . mometasone-formoterol  2 puff Inhalation BID  . omega-3 acid ethyl esters  2 g Oral BID  . pantoprazole  40 mg Oral Daily  . polyethylene glycol  17 g Oral BID  . senna-docusate  1 tablet Oral BID  . terazosin  2 mg Oral QHS  . Warfarin - Pharmacist Dosing Inpatient   Does not apply Q24H   Continuous Infusions:   LOS: 8 days   Kerney Elbe, DO [facility] Pager [phone]  If 7PM-7AM, please contact night-coverage [email or web address] Password [facility] 01/13/2017, 6:36 PM

## 2017-01-13 NOTE — Progress Notes (Signed)
[place] for Coumadin (chronic Rx PTA) Indication: atrial fibrillation  Allergies  Allergen Reactions  . Wellbutrin [Bupropion] Anxiety    Patient Measurements: Height: '5\' 9"'$  (175.3 cm) Weight: 186 lb 1.1 oz (84.4 kg) IBW/kg (Calculated) : 70.7  Vital Signs: Temp: 97.6 F (36.4 C) (03/03 0712) Temp Source: Oral (03/03 0712) BP: 116/79 (03/03 0800) Pulse Rate: 104 (03/03 0800)  Labs:  Recent Labs  01/11/17 0441 01/12/17 0422 01/13/17 0540  HGB 12.0* 12.1* 12.4*  HCT 33.2* 33.5* 34.8*  PLT 158 173 201  LABPROT 28.2* 24.5* 24.8*  INR 2.58 2.17 2.20  CREATININE 3.80* 3.35* 3.09*  3.02*    Estimated Creatinine Clearance: 20.5 mL/min (by C-G formula based on SCr of 3.02 mg/dL (H)).   Medical History: Past Medical History:  Diagnosis Date  . Acute bronchitis 04/03/2014  . Anxiety   . Atrial fibrillation ([place])   . Cataract   . COPD (chronic obstructive pulmonary disease) ([place])   . Essential hypertension   . Hyperlipidemia   . Macular degeneration   . Noncompliance with medications 04/2013   Xarelto, digoxin previously  . Pre-diabetes   . Prostate cancer ([place])   . Stage III chronic kidney disease 04/03/2014  . Tubular adenoma of colon 07/31/02, 11/18/03    Medications:  Prescriptions Prior to Admission  Medication Sig Dispense Refill Last Dose  . albuterol (PROVENTIL HFA;VENTOLIN HFA) 108 (90 BASE) MCG/ACT inhaler Inhale 2 puffs into the lungs every 6 (six) hours as needed for wheezing or shortness of breath. 1 Inhaler 2 Past Week at Unknown time  . ALLERGY RELIEF 10 MG tablet TAKE 1 TABLET DAILY FOR ALLERGY 30 tablet 5 Past Week at Unknown time  . atorvastatin (LIPITOR) 20 MG tablet TAKE 1 TABLET DAILY 30 tablet 2 Past Week at Unknown time  . budesonide-formoterol (SYMBICORT) 160-4.5 MCG/ACT inhaler Inhale 2 puffs into the lungs 2 (two) times daily. 1 Inhaler 3 Past Week at Unknown time  . clonazePAM (KLONOPIN) 1 MG tablet  TAKE 1/2 TABLET ONCE DAILY AS NEEDED FOR ANXIETY 15 tablet 0 Past Week at Unknown time  . diltiazem (CARDIZEM CD) 300 MG 24 hr capsule TAKE (1) CAPSULE DAILY 30 capsule 1 Past Week at Unknown time  . doxycycline (ADOXA) 100 MG tablet Take 1 tablet (100 mg total) by mouth 2 (two) times daily. 20 tablet 0 Past Week at Unknown time  . doxycycline (VIBRA-TABS) 100 MG tablet Take 1 tablet by mouth 2 (two) times daily.   Past Week at Unknown time  . doxylamine, Sleep, (UNISOM) 25 MG tablet Take 25 mg by mouth at bedtime as needed.   Past Week at Unknown time  . DULoxetine (CYMBALTA) 60 MG capsule TAKE (1) CAPSULE DAILY 30 capsule 1 Past Week at Unknown time  . FERREX 150 150 MG capsule Take 1 capsule by mouth daily.   Past Week at Unknown time  . furosemide (LASIX) 40 MG tablet Take 1 tablet by mouth every other day.    Past Week at Unknown time  . metoprolol tartrate (LOPRESSOR) 25 MG tablet TAKE (1) TABLET TWICE A DAY. 60 tablet 4 Past Week at Unknown time  . Multiple Vitamin (MULTIVITAMIN WITH MINERALS) TABS Take 1 tablet by mouth daily.   Past Week at Unknown time  . nitroGLYCERIN (NITROSTAT) 0.4 MG SL tablet Place 1 tablet (0.4 mg total) under the tongue every 5 (five) minutes as needed for chest pain. 25 tablet 2 Past Week at Unknown time  .  omega-3 acid ethyl esters (LOVAZA) 1 G capsule TAKE (2) CAPSULES TWICE DAILY. 120 capsule 2 Past Week at Unknown time  . pantoprazole (PROTONIX) 40 MG tablet TAKE 1 TABLET DAILY 30 tablet 2 Past Week at Unknown time  . Polyethyl Glycol-Propyl Glycol (SYSTANE OP) Place 1 drop into both eyes 2 (two) times daily as needed (dry eyes).    Past Week at Unknown time  . Probiotic Product (PRO-BIOTIC BLEND) CAPS Take 1 capsule by mouth daily.   Past Week at Unknown time  . terazosin (HYTRIN) 2 MG capsule TAKE 1 CAPSULE AT BEDTIME 30 capsule 4 Past Week at Unknown time  . tiotropium (SPIRIVA) 18 MCG inhalation capsule Place 1 capsule (18 mcg total) into inhaler and inhale  daily. 30 capsule 3 Past Week at Unknown time  . triamcinolone cream (KENALOG) 0.1 % Apply 1 application topically 2 (two) times daily. 80 g 0 Past Week at Unknown time  . warfarin (COUMADIN) 4 MG tablet Take 1 and 1/2 tablets (= '6mg'$ ) daily except on Sundays and Thursdays take 1 tablet (='4mg'$ ) 45 tablet 0 Past Week at Unknown time  . Incontinence Supply Disposable (INCONTINENCE BRIEF LARGE) MISC Use as needed for urinary incontinence.  Dx: urinary incontinence R32 and prostate cancer C61 200 each 1 Taking   Assessment: 76yo male on chronic Coumadin PTA.  INR is therapeutic today at 2.2.   Goal of Therapy:  INR 2-3   Plan:  Coumadin 6 mg po today. PT-INR daily Monitor for s/s of bleeding  Isac Sarna, BS [place], BCPS Clinical Pharmacist Pager [phone] 01/13/2017,8:46 AM

## 2017-01-13 NOTE — Progress Notes (Signed)
Subjective: Interval History: No new complaints.  Objective: Vital signs in last 24 hours: Temp:  [97.6 F (36.4 C)-97.7 F (36.5 C)] 97.6 F (36.4 C) (03/03 0712) Pulse Rate:  [89-104] 104 (03/03 0800) Resp:  [0-28] 28 (03/03 0800) BP: (109-136)/(77-89) 116/79 (03/03 0800) SpO2:  [92 %-99 %] 95 % (03/03 0800) Weight:  [84.4 kg (186 lb 1.1 oz)] 84.4 kg (186 lb 1.1 oz) (03/03 0548) Weight change:   Intake/Output from previous day: 03/02 0701 - 03/03 0700 In: -  Out: 900 [Urine:900] Intake/Output this shift: No intake/output data recorded.  General appearance: alert, cooperative and no distress Resp: diminished breath sounds bilaterally and wheezes bilaterally Cardio: regular rate and rhythm Extremities: No edema  Lab Results:  Recent Labs  01/11/17 0441 01/12/17 0422  WBC 7.8 8.0  HGB 12.0* 12.1*  HCT 33.2* 33.5*  PLT 158 173   BMET:   Recent Labs  01/12/17 0422 01/13/17 0540  NA 136 137  138  K 4.0 3.6  3.6  CL 106 108  108  CO2 20* 19*  20*  GLUCOSE 174* 208*  210*  BUN 84* 79*  78*  CREATININE 3.35* 3.09*  3.02*  CALCIUM 7.9* 8.1*  8.1*   No results for input(s): PTH in the last 72 hours. Iron Studies: No results for input(s): IRON, TIBC, TRANSFERRIN, FERRITIN in the last 72 hours.  Studies/Results: No results found.  I have reviewed the patient's current medications.  Assessment/Plan: Problem #1 acute kidney injury superimposed on chronic.  Possibly secondary to ATN versus cardiorenal. His Creatinine has gone down to 3.09. Continue to improve. Problem #2 chronic renal failure: Possibly early stage IV versus late stage III. Problem #3 difficulty breathing: Possibly a combination of influenza and exacerbation of COPD. patient is improving. Patient is still on Lasix but frequency was decreased to once a day. Patient remains nonoliguric. Problem #4 metabolic bone disease: His calcium and phosphorus is range. Problem #5 hypertension: His blood  pressure is reasonably controlled Problem #6 history of a trial fibrillation:  His heart rate seems to be better controlled. Problem #7 hyponatremia: Possibly hypovolemic hyponatremia :  His sodium has corrected. Plan: 1] will continue with present management           2] will check his renal panel in the morning.    LOS: 8 days   Don [name] S 01/13/2017,8:16 AM

## 2017-01-14 ENCOUNTER — Inpatient Hospital Stay (HOSPITAL_COMMUNITY): Payer: Medicare HMO

## 2017-01-14 DIAGNOSIS — E876 Hypokalemia: Secondary | ICD-10-CM

## 2017-01-14 LAB — COMPREHENSIVE METABOLIC PANEL
ALK PHOS: 51 U/L (ref 38–126)
ALT: 29 U/L (ref 17–63)
ANION GAP: 9 (ref 5–15)
AST: 27 U/L (ref 15–41)
Albumin: 2.4 g/dL — ABNORMAL LOW (ref 3.5–5.0)
BUN: 81 mg/dL — ABNORMAL HIGH (ref 6–20)
CALCIUM: 8.3 mg/dL — AB (ref 8.9–10.3)
CO2: 24 mmol/L (ref 22–32)
CREATININE: 2.92 mg/dL — AB (ref 0.61–1.24)
Chloride: 106 mmol/L (ref 101–111)
GFR, EST AFRICAN AMERICAN: 22 mL/min — AB (ref >60)
GFR, EST NON AFRICAN AMERICAN: 19 mL/min — AB (ref >60)
Glucose, Bld: 179 mg/dL — ABNORMAL HIGH (ref 65–99)
Potassium: 3.3 mmol/L — ABNORMAL LOW (ref 3.5–5.1)
Sodium: 139 mmol/L (ref 135–145)
Total Bilirubin: 0.8 mg/dL (ref 0.3–1.2)
Total Protein: 4.8 g/dL — ABNORMAL LOW (ref 6.5–8.1)

## 2017-01-14 LAB — CBC WITH DIFFERENTIAL/PLATELET
Basophils Absolute: 0 10*3/uL (ref 0.0–0.1)
Basophils Relative: 0 %
EOS PCT: 0 %
Eosinophils Absolute: 0 10*3/uL (ref 0.0–0.7)
HCT: 35 % — ABNORMAL LOW (ref 39.0–52.0)
HEMOGLOBIN: 12.7 g/dL — AB (ref 13.0–17.0)
LYMPHS ABS: 0.3 10*3/uL — AB (ref 0.7–4.0)
LYMPHS PCT: 3 %
MCH: 34.2 pg — AB (ref 26.0–34.0)
MCHC: 36.3 g/dL — AB (ref 30.0–36.0)
MCV: 94.3 fL (ref 78.0–100.0)
MONOS PCT: 4 %
Monocytes Absolute: 0.4 10*3/uL (ref 0.1–1.0)
Neutro Abs: 9.4 10*3/uL — ABNORMAL HIGH (ref 1.7–7.7)
Neutrophils Relative %: 93 %
PLATELETS: 200 10*3/uL (ref 150–400)
RBC: 3.71 MIL/uL — AB (ref 4.22–5.81)
RDW: 13.1 % (ref 11.5–15.5)
WBC: 10.1 10*3/uL (ref 4.0–10.5)

## 2017-01-14 LAB — GLUCOSE, CAPILLARY
Glucose-Capillary: 197 mg/dL — ABNORMAL HIGH (ref 65–99)
Glucose-Capillary: 200 mg/dL — ABNORMAL HIGH (ref 65–99)
Glucose-Capillary: 182 mg/dL — ABNORMAL HIGH (ref 65–99)
Glucose-Capillary: 234 mg/dL — ABNORMAL HIGH (ref 65–99)

## 2017-01-14 LAB — PROTIME-INR
INR: 2.57
PROTHROMBIN TIME: 28.1 s — AB (ref 11.4–15.2)

## 2017-01-14 LAB — PHOSPHORUS: Phosphorus: 4.4 mg/dL (ref 2.5–4.6)

## 2017-01-14 LAB — MAGNESIUM: MAGNESIUM: 1.4 mg/dL — AB (ref 1.7–2.4)

## 2017-01-14 MED ORDER — SODIUM CHLORIDE 0.9 % IN NEBU
4.0000 mL | INHALATION_SOLUTION | Freq: Every day | RESPIRATORY_TRACT | Status: AC
  Administered 2017-01-15 – 2017-01-16 (×2): 4 mL via RESPIRATORY_TRACT
  Filled 2017-01-14 (×3): qty 6

## 2017-01-14 MED ORDER — TORSEMIDE 20 MG PO TABS
40.0000 mg | ORAL_TABLET | Freq: Every day | ORAL | Status: DC
  Administered 2017-01-14 – 2017-01-15 (×2): 40 mg via ORAL
  Filled 2017-01-14 (×2): qty 2

## 2017-01-14 MED ORDER — POTASSIUM CHLORIDE 20 MEQ PO PACK
20.0000 meq | PACK | Freq: Once | ORAL | Status: DC
  Filled 2017-01-14: qty 1

## 2017-01-14 MED ORDER — FUROSEMIDE 10 MG/ML IJ SOLN
40.0000 mg | Freq: Once | INTRAMUSCULAR | Status: AC
  Administered 2017-01-14: 40 mg via INTRAVENOUS
  Filled 2017-01-14: qty 4

## 2017-01-14 MED ORDER — WARFARIN SODIUM 2 MG PO TABS
4.0000 mg | ORAL_TABLET | Freq: Once | ORAL | Status: AC
  Administered 2017-01-14: 4 mg via ORAL
  Filled 2017-01-14: qty 2

## 2017-01-14 MED ORDER — MAGNESIUM SULFATE 2 GM/50ML IV SOLN
2.0000 g | Freq: Once | INTRAVENOUS | Status: AC
  Administered 2017-01-14: 2 g via INTRAVENOUS
  Filled 2017-01-14: qty 50

## 2017-01-14 MED ORDER — POTASSIUM CHLORIDE CRYS ER 20 MEQ PO TBCR
40.0000 meq | EXTENDED_RELEASE_TABLET | Freq: Every day | ORAL | Status: DC
  Administered 2017-01-14 – 2017-01-15 (×2): 40 meq via ORAL
  Filled 2017-01-14 (×2): qty 2

## 2017-01-14 NOTE — Progress Notes (Signed)
Pt having some SOB with bilat crackles. Dr Marin Comment notified. V.o.  Lasix given. [name] 01/14/2017 3:11 AM

## 2017-01-14 NOTE — Progress Notes (Signed)
Subjective: Interval History: Patient complains of left flank pain when he is coughing. His breathing is okay but no significant difference. At this moment he doesn't have any new issues.   Objective: Vital signs in last 24 hours: Temp:  [97.3 F (36.3 C)-98.3 F (36.8 C)] 98.3 F (36.8 C) (03/03 1937) Pulse Rate:  [87-107] 101 (03/03 1937) Resp:  [0-16] 14 (03/03 1937) BP: (100-141)/(55-97) 141/90 (03/04 0544) SpO2:  [93 %-99 %] 95 % (03/04 0832) Weight:  [82.9 kg (182 lb 12.2 oz)] 82.9 kg (182 lb 12.2 oz) (03/04 0500) Weight change: -1.5 kg (-3 lb 4.9 oz)  Intake/Output from previous day: 03/03 0701 - 03/04 0700 In: 600 [P.O.:600] Out: 2150 [Urine:2150] Intake/Output this shift: No intake/output data recorded.  General appearance: alert, cooperative and no distress Resp: diminished breath sounds bilaterally and wheezes bilaterally Cardio: regular rate and rhythm Extremities: No edema  Lab Results:  Recent Labs  01/13/17 0540 01/14/17 0434  WBC 8.6 10.1  HGB 12.4* 12.7*  HCT 34.8* 35.0*  PLT 201 200   BMET:   Recent Labs  01/13/17 0540 01/14/17 0434  NA 137  138 139  K 3.6  3.6 3.3*  CL 108  108 106  CO2 19*  20* 24  GLUCOSE 208*  210* 179*  BUN 79*  78* 81*  CREATININE 3.09*  3.02* 2.92*  CALCIUM 8.1*  8.1* 8.3*   No results for input(s): PTH in the last 72 hours. Iron Studies: No results for input(s): IRON, TIBC, TRANSFERRIN, FERRITIN in the last 72 hours.  Studies/Results: Dg Chest Port 1 View  Result Date: 01/14/2017 CLINICAL DATA:  Shortness of breath. EXAM: PORTABLE CHEST 1 VIEW COMPARISON:  Radiograph of January 05, 2017. FINDINGS: The heart size and mediastinal contours are within normal limits. Both lungs are clear. No pneumothorax or pleural effusion is noted. The visualized skeletal structures are unremarkable. IMPRESSION: No acute cardiopulmonary abnormality seen. Electronically Signed   By: Marijo Conception, M.D.   On: 01/14/2017 07:34     I have reviewed the patient's current medications.  Assessment/Plan: Problem #1 acute kidney injury superimposed on chronic.  Possibly secondary to ATN versus cardiorenal. His Renal function continued to improve. His creatinine however is still above his baseline.  Problem #2 chronic renal failure: Possibly early stage IV versus late stage III. Problem #3 difficulty breathing: Possibly a combination of influenza and exacerbation of COPD. patient is improving. Patient is on Lasix IV. He had 2100 mL of urine output.  Problem #4 metabolic bone disease: His calcium and phosphorus is range. Problem #5 hypertension: His blood pressure is reasonably controlled Problem #6 history of a trial fibrillation:  His heart rate seems to be better controlled. Problem #7 hyponatremia: Possibly hypovolemic hyponatremia :  His sodium has corrected. Problem #8 hypokalemia: Most likely from Lasix.  Plan: 1] will start patient on KCl 40 mEq by mouth once a day            2] will DC IV Lasix and start him on Demadex 40 mg once a day.           3] will check his renal panel in the morning.    LOS: 9 days   Heavin Sebree S 01/14/2017,8:33 AM

## 2017-01-14 NOTE — Progress Notes (Signed)
PROGRESS NOTE    Don [name]  JOA:[id number] DOB: 05/29/1941 DOA: 01/05/2017 PCP: Wardell Honour, MD   Brief Narrative:  76 year old male with history of COPD and tobacco abuse, came to the emergency room with complaints of shortness of breath. Found to have COPD exacerbation and tested positive for influenza. He also had rapid atrial fibrillation requiring Cardizem infusion. Started on steroids, bronchodilators and antibiotics. Respiratory status has been slow to improve. Rapid atrial fibrillation being driven by respiratory status. Cardiology following. Also has AKI on CKD3 and nephrology is following. Slowly improving respiratory wise and Cr steadily trending down very slowly. Still SOB and states he is having Left sided pain when coughing.   Assessment & Plan:   Active Problems:   Hypertension   Tobacco abuse   Stage III chronic kidney disease   COPD exacerbation (HCC)   Atrial fibrillation with RVR (HCC)   Acute respiratory failure with hypoxia (HCC)   AKI (acute kidney injury) (HCC)   Influenza A   Respiratory distress   SOB (shortness of breath)  1. Acute respiratory failure with hypoxia related to COPD exacerbation and Influenza A. stable -We'll start to wean off oxygen as his clinical condition improves.  -Maintain O2 Saturations >92% -Will likely need 6 minute walk screen prior to D/C when stable.  -Still requiring O2 and significantly rhodochrous and wheezing  2. COPD exacerbation.  -Continue the patient on IV Methylprednisolone 60 mg q6h, D/C'd Abx with Levofloxacin 500 mg po q48h as treatment is complete. C/w bronchodilators with Xopenex 0.63 mg Neb 4 Times Daily and 0.63 mg q2hprn and Ipratropium 0.5 mg Neb 4 times daily.  -Continues to wheeze and is short of breath significantly. Continue pulmonary hygiene. -C/w Dulera 2 puff IH BID and Gauifenesin 1200 po BID -C/w Pantoprazole 40 mg po Daily while on IV Steroids  -Will order Flutter Valve and Incentive  Spirometry -C/w Chest Physiotherapy q4h while awake -Will order Hypertonic Saline Nebs however discussed with Pharmacy and only have 0.9% Nebs so changed to that -C/w Guaifenesin 1,200 mg po BID -Has Left sided flank/abdominal Pain from coughing -Repeat CXR in AM  3. Atrial fibrillation with RVR. stable -Likely precipitated by underlying respiratory issues.  -He does take Metoprolol at home, which is being held for now due to significant bronchospasms. He also takes diltiazem.  -C/w Diltazem 90 mg po q6h and Bisoprolol 5 mg po BID; Cardiology recommends switching to long-acting diltiazem CD when further improvement in Pulmonary Status -Blood pressures are low making further titration of medications challenging.  -Digoxin not good option due to renal issues.  -Cardiology following and appreciate Recc's -He is anticoagulated with Coumadin and continue Coumadin per Pharmacy (they will give him 4 mg of Coumadin tonight as PT was 28.1 and INR was 2.57) - TSH normal. Echocardiogram unremarkable, EF 60-65%.  4. AKI on CKD3/4.  -BUN/Cr trending down from 90/3.88 -> 84/3.35 -> 78/3.02 -> 81/2.92 -Received IV Lasix 40 mg overnight because of increased SOB and Crackles -Nephrology stopping patient's IV Lasix and changing to Torsemide 40 mg po Daily; (previously had stopped IVF at 125 mL/hr) -Nephrology Dr. Lowanda Foster following and appreciate Recc's.  -Avoid Nephrotoxics -Follow Strict Urine output. Will place Condom Catheter to accurately measure  -Repeat CMP in AM  5. Hypertension.  -Blood pressures currently stable.  -Continue current treatments with Diltiazem and Bisoprolol.    6. Hypokalemia -Likely from IV Lasix -Patient's K+ was 3.3 -Replete with 40 mEQ of KCL po Daily -Repeat CMP in  AM  7. Hypomagnesemia -Patient's Mag Level was 1.4 -Replete with IV Mag Sulfate 2 grams -Repeat Mag Level in AM  8. Tobacco Use.  -Counseled on importance of tobacco cessation.  9. Influenza A.    -C/w Droplet Precautions while in the Hospital  -S/p Oseltamivir 30 mg po Daily x 5 days because of AKI  10. Hyperglycemia. -Likely related to IV Steroids. -HbA1c was 6.0.  -CBG's have been running from 184-211 -C/w Moderate Novolog SSI AC Hs; May Increase to Resistant Scale  11. Leukocytosis  -Improved -WBC went from 14.4 -> 12.1 -> 8.2 -> 7.8 -> 8.0 -> 8.6 -> 10.1 -Repeat CBC in AM  12. Hyponatremia, improved -Improved to 139 -Likely from Hypovolemia -Continue to Trend CMP's  13. Constipation, improved -Scheduled Miralax 17 grams po BID -Started Senna-Doucsate 1 tab po BID -Gave Bisacodyl Suppository x1 dose  DVT prophylaxis: Anticoagulated with Coumadin Code Status: Partial Code, no CPR, No defibrilliation Family Communication: No family present at bedside Disposition Plan: Likely Home at D/C; Will obtain PT evaluation prior to D/C and if improved in AM transfer to Medical Floor with Telemetry  Consultants:   Cardiology  Nephrology  Procedures:   ECHOCARDIOGRAM 01/05/17 Study Conclusions  - Left ventricle: The cavity size was normal. Systolic function was   normal. The estimated ejection fraction was in the range of 60%   to 65%. Wall motion was normal; there were no regional wall   motion abnormalities. The study was not technically sufficient to   allow evaluation of LV diastolic dysfunction due to atrial   fibrillation. Mild concentric and moderate focal basal septal   hypertrophy. - Aortic valve: Trileaflet; mildly calcified leaflets. There was no   stenosis. - Mitral valve: Moderately calcified annulus. - Left atrium: The atrium was mildly dilated.   Antimicrobials:  Anti-infectives    Start     Dose/Rate Route Frequency Ordered Stop   01/09/17 1400  levofloxacin (LEVAQUIN) tablet 500 mg  Status:  Discontinued     500 mg Oral Every 48 hours 01/08/17 1345 01/11/17 1807   01/05/17 2200  oseltamivir (TAMIFLU) capsule 75 mg  Status:  Discontinued      75 mg Oral 2 times daily 01/05/17 1836 01/05/17 1848   01/05/17 2000  oseltamivir (TAMIFLU) capsule 30 mg     30 mg Oral Daily 01/05/17 1848 01/09/17 1954   01/05/17 1400  levofloxacin (LEVAQUIN) tablet 750 mg  Status:  Discontinued     750 mg Oral Every 48 hours 01/05/17 1333 01/08/17 1345     Subjective: States he was still significantly SOB and had some left sided pain with coughing. No Nausea or Vomiting. Mildly dyspenic when speaking. No other concerns or complaints at this time and received Lasix early this AM and states he has been urinating tremendously.   Objective: Vitals:   01/14/17 0826 01/14/17 0832 01/14/17 1108 01/14/17 1109  BP:    122/76  Pulse:      Resp:      Temp:  98.1 F (36.7 C)    TempSrc:  Oral    SpO2: 95% 95% 94%   Weight:      Height:        Intake/Output Summary (Last 24 hours) at 01/14/17 1330 Last data filed at 01/14/17 0832  Gross per 24 hour  Intake              830 ml  Output             1400  ml  Net             -570 ml   Filed Weights   01/09/17 0500 01/13/17 0548 01/14/17 0500  Weight: 79.9 kg (176 lb 2.4 oz) 84.4 kg (186 lb 1.1 oz) 82.9 kg (182 lb 12.2 oz)   Examination: Physical Exam:  Constitutional: Appears calm and comfortable and in mild Respiratory distress Eyes: Sclerae anicteric  ENMT: External Ears, Nose appear normal. Grossly normal hearing.  Neck: Appears normal, supple, no cervical masses, normal ROM, no appreciable thyromegaly or JVD Respiratory: Diminished to auscultation bilaterally with expiratory wheezing and rhonchus breath sounds. Slightly increased respiratory effort. No accessory muscle use. Wearing Supplemental O2 via [place].  Cardiovascular: Irregularly Irregular, no murmurs / rubs / gallops. S1 and S2 auscultated. Mild extremity edema.  Abdomen: Soft, non-tender, non-distended. No masses palpated. No appreciable hepatosplenomegaly. Bowel sounds positive x4.  GU: Deferred. Musculoskeletal: No clubbing / cyanosis of  digits/nails. No joint deformity upper and lower extremities.  Skin: No rashes, lesions, ulcers on limited skin eval. No induration; Warm and dry. Upper arm erythema and slight bruising noted.  Neurologic: CN 2-12 grossly intact with no focal deficits. Romberg sign cerebellar reflexes not assessed.  Psychiatric: Normal judgment and insight. Alert and oriented x 3. Normal mood and appropriate affect.   Data Reviewed: I have personally reviewed following labs and imaging studies  CBC:  Recent Labs Lab 01/10/17 0422 01/11/17 0441 01/12/17 0422 01/13/17 0540 01/14/17 0434  WBC 8.2 7.8 8.0 8.6 10.1  NEUTROABS 7.4 7.0 7.4 7.9* 9.4*  HGB 11.8* 12.0* 12.1* 12.4* 12.7*  HCT 32.8* 33.2* 33.5* 34.8* 35.0*  MCV 95.3 94.6 94.6 94.8 94.3  PLT 156 158 173 201 481   Basic Metabolic Panel:  Recent Labs Lab 01/10/17 0422 01/11/17 0441 01/12/17 0422 01/13/17 0540 01/14/17 0434  NA 131* 133* 136 137  138 139  K 4.5 3.9 4.0 3.6  3.6 3.3*  CL 98* 101 106 108  108 106  CO2 22 21* 20* 19*  20* 24  GLUCOSE 234* 211* 174* 208*  210* 179*  BUN 90* 86* 84* 79*  78* 81*  CREATININE 3.88* 3.80* 3.35* 3.09*  3.02* 2.92*  CALCIUM 8.1* 7.8* 7.9* 8.1*  8.1* 8.3*  MG  --  1.3*  --  1.3* 1.4*  PHOS 4.8* 5.1* 4.1 4.3  4.3 4.4   GFR: Estimated Creatinine Clearance: 21.2 mL/min (by C-G formula based on SCr of 2.92 mg/dL (H)). Liver Function Tests:  Recent Labs Lab 01/10/17 0422 01/11/17 0441 01/12/17 0422 01/13/17 0540 01/14/17 0434  AST  --  41  --  30 27  ALT  --  33  --  30 29  ALKPHOS  --  48  --  48 51  BILITOT  --  0.7  --  0.9 0.8  PROT  --  4.8*  --  4.7* 4.8*  ALBUMIN 2.5* 2.3* 2.2* 2.3*  2.2* 2.4*   No results for input(s): LIPASE, AMYLASE in the last 168 hours. No results for input(s): AMMONIA in the last 168 hours. Coagulation Profile:  Recent Labs Lab 01/10/17 0422 01/11/17 0441 01/12/17 0422 01/13/17 0540 01/14/17 0434  INR 3.05 2.58 2.17 2.20 2.57   Cardiac  Enzymes: No results for input(s): CKTOTAL, CKMB, CKMBINDEX, TROPONINI in the last 168 hours. BNP (last 3 results) No results for input(s): PROBNP in the last 8760 hours. HbA1C: No results for input(s): HGBA1C in the last 72 hours. CBG:  Recent Labs Lab 01/13/17 1119 01/13/17  1634 01/13/17 2212 01/14/17 0810 01/14/17 1119  GLUCAP 200* 211* 225* 197* 200*   Lipid Profile: No results for input(s): CHOL, HDL, LDLCALC, TRIG, CHOLHDL, LDLDIRECT in the last 72 hours. Thyroid Function Tests: No results for input(s): TSH, T4TOTAL, FREET4, T3FREE, THYROIDAB in the last 72 hours. Anemia Panel: No results for input(s): VITAMINB12, FOLATE, FERRITIN, TIBC, IRON, RETICCTPCT in the last 72 hours. Sepsis Labs: No results for input(s): PROCALCITON, LATICACIDVEN in the last 168 hours.  Recent Results (from the past 240 hour(s))  MRSA PCR Screening     Status: None   Collection Time: 01/05/17  1:34 PM  Result Value Ref Range Status   MRSA by PCR NEGATIVE NEGATIVE Final    Comment:        The GeneXpert MRSA Assay (FDA approved for NASAL specimens only), is one component of a comprehensive MRSA colonization surveillance program. It is not intended to diagnose MRSA infection nor to guide or monitor treatment for MRSA infections.     Radiology Studies: Dg Chest Port 1 View  Result Date: 01/14/2017 CLINICAL DATA:  Shortness of breath. EXAM: PORTABLE CHEST 1 VIEW COMPARISON:  Radiograph of January 05, 2017. FINDINGS: The heart size and mediastinal contours are within normal limits. Both lungs are clear. No pneumothorax or pleural effusion is noted. The visualized skeletal structures are unremarkable. IMPRESSION: No acute cardiopulmonary abnormality seen. Electronically Signed   By: Marijo Conception, M.D.   On: 01/14/2017 07:34    Scheduled Meds: . atorvastatin  20 mg Oral q1800  . bisacodyl  10 mg Rectal Once  . bisoprolol  5 mg Oral BID  . diltiazem  90 mg Oral Q6H  . DULoxetine  60 mg  Oral Daily  . guaiFENesin  1,200 mg Oral BID  . insulin aspart  0-15 Units Subcutaneous TID WC  . insulin aspart  0-5 Units Subcutaneous QHS  . ipratropium  0.5 mg Nebulization QID  . iron polysaccharides  150 mg Oral Daily  . levalbuterol  0.63 mg Nebulization QID  . mouth rinse  15 mL Mouth Rinse BID  . methylPREDNISolone (SOLU-MEDROL) injection  60 mg Intravenous Q6H  . mometasone-formoterol  2 puff Inhalation BID  . omega-3 acid ethyl esters  2 g Oral BID  . pantoprazole  40 mg Oral Daily  . polyethylene glycol  17 g Oral BID  . potassium chloride  40 mEq Oral Daily  . senna-docusate  1 tablet Oral BID  . terazosin  2 mg Oral QHS  . torsemide  40 mg Oral Daily  . warfarin  4 mg Oral Once  . Warfarin - Pharmacist Dosing Inpatient   Does not apply Q24H   Continuous Infusions:   LOS: 9 days   Kerney Elbe, DO [facility] Pager [phone]  If 7PM-7AM, please contact night-coverage [email or web address] Password [email or web address] 01/14/2017, 1:30 PM

## 2017-01-14 NOTE — Progress Notes (Signed)
Don [name] for Coumadin (chronic Rx PTA) Indication: atrial fibrillation  Allergies  Allergen Reactions  . Wellbutrin [Bupropion] Anxiety    Patient Measurements: Height: '5\' 9"'$  (175.3 cm) Weight: 182 lb 12.2 oz (82.9 kg) IBW/kg (Calculated) : 70.7  Vital Signs: BP: 141/90 (03/04 0544)  Labs:  Recent Labs  01/12/17 0422 01/13/17 0540 01/14/17 0434  HGB 12.1* 12.4* 12.7*  HCT 33.5* 34.8* 35.0*  PLT 173 201 200  LABPROT 24.5* 24.8* 28.1*  INR 2.17 2.20 2.57  CREATININE 3.35* 3.09*  3.02* 2.92*    Estimated Creatinine Clearance: 21.2 mL/min (by C-G formula based on SCr of 2.92 mg/dL (H)).   Medical History: Past Medical History:  Diagnosis Date  . Acute bronchitis 04/03/2014  . Anxiety   . Atrial fibrillation ([place])   . Cataract   . COPD (chronic obstructive pulmonary disease) ([place])   . Essential hypertension   . Hyperlipidemia   . Macular degeneration   . Noncompliance with medications 04/2013   Xarelto, digoxin previously  . Pre-diabetes   . Prostate cancer ([place])   . Stage III chronic kidney disease 04/03/2014  . Tubular adenoma of colon 07/31/02, 11/18/03    Medications:  Prescriptions Prior to Admission  Medication Sig Dispense Refill Last Dose  . albuterol (PROVENTIL HFA;VENTOLIN HFA) 108 (90 BASE) MCG/ACT inhaler Inhale 2 puffs into the lungs every 6 (six) hours as needed for wheezing or shortness of breath. 1 Inhaler 2 Past Week at Unknown time  . ALLERGY RELIEF 10 MG tablet TAKE 1 TABLET DAILY FOR ALLERGY 30 tablet 5 Past Week at Unknown time  . atorvastatin (LIPITOR) 20 MG tablet TAKE 1 TABLET DAILY 30 tablet 2 Past Week at Unknown time  . budesonide-formoterol (SYMBICORT) 160-4.5 MCG/ACT inhaler Inhale 2 puffs into the lungs 2 (two) times daily. 1 Inhaler 3 Past Week at Unknown time  . clonazePAM (KLONOPIN) 1 MG tablet TAKE 1/2 TABLET ONCE DAILY AS NEEDED FOR ANXIETY 15 tablet 0 Past Week at Unknown time  . diltiazem  (CARDIZEM CD) 300 MG 24 hr capsule TAKE (1) CAPSULE DAILY 30 capsule 1 Past Week at Unknown time  . doxycycline (ADOXA) 100 MG tablet Take 1 tablet (100 mg total) by mouth 2 (two) times daily. 20 tablet 0 Past Week at Unknown time  . doxycycline (VIBRA-TABS) 100 MG tablet Take 1 tablet by mouth 2 (two) times daily.   Past Week at Unknown time  . doxylamine, Sleep, (UNISOM) 25 MG tablet Take 25 mg by mouth at bedtime as needed.   Past Week at Unknown time  . DULoxetine (CYMBALTA) 60 MG capsule TAKE (1) CAPSULE DAILY 30 capsule 1 Past Week at Unknown time  . FERREX 150 150 MG capsule Take 1 capsule by mouth daily.   Past Week at Unknown time  . furosemide (LASIX) 40 MG tablet Take 1 tablet by mouth every other day.    Past Week at Unknown time  . metoprolol tartrate (LOPRESSOR) 25 MG tablet TAKE (1) TABLET TWICE A DAY. 60 tablet 4 Past Week at Unknown time  . Multiple Vitamin (MULTIVITAMIN WITH MINERALS) TABS Take 1 tablet by mouth daily.   Past Week at Unknown time  . nitroGLYCERIN (NITROSTAT) 0.4 MG SL tablet Place 1 tablet (0.4 mg total) under the tongue every 5 (five) minutes as needed for chest pain. 25 tablet 2 Past Week at Unknown time  . omega-3 acid ethyl esters (LOVAZA) 1 G capsule TAKE (2) CAPSULES TWICE DAILY. 120 capsule 2  Past Week at Unknown time  . pantoprazole (PROTONIX) 40 MG tablet TAKE 1 TABLET DAILY 30 tablet 2 Past Week at Unknown time  . Polyethyl Glycol-Propyl Glycol (SYSTANE OP) Place 1 drop into both eyes 2 (two) times daily as needed (dry eyes).    Past Week at Unknown time  . Probiotic Product (PRO-BIOTIC BLEND) CAPS Take 1 capsule by mouth daily.   Past Week at Unknown time  . terazosin (HYTRIN) 2 MG capsule TAKE 1 CAPSULE AT BEDTIME 30 capsule 4 Past Week at Unknown time  . tiotropium (SPIRIVA) 18 MCG inhalation capsule Place 1 capsule (18 mcg total) into inhaler and inhale daily. 30 capsule 3 Past Week at Unknown time  . triamcinolone cream (KENALOG) 0.1 % Apply 1  application topically 2 (two) times daily. 80 g 0 Past Week at Unknown time  . warfarin (COUMADIN) 4 MG tablet Take 1 and 1/2 tablets (= '6mg'$ ) daily except on Sundays and Thursdays take 1 tablet (='4mg'$ ) 45 tablet 0 Past Week at Unknown time  . Incontinence Supply Disposable (INCONTINENCE BRIEF LARGE) MISC Use as needed for urinary incontinence.  Dx: urinary incontinence R32 and prostate cancer C61 200 each 1 Taking   Assessment: 76yo male on chronic Coumadin PTA.  INR is therapeutic today at 2.57.   Goal of Therapy:  INR 2-3   Plan:  Coumadin 4 mg po today. PT-INR daily Monitor for s/s of bleeding  Isac Sarna, BS [place], BCPS Clinical Pharmacist Pager [phone] 01/14/2017,9:09 AM

## 2017-01-15 ENCOUNTER — Encounter: Payer: Self-pay | Admitting: Pharmacist

## 2017-01-15 ENCOUNTER — Inpatient Hospital Stay (HOSPITAL_COMMUNITY): Payer: Medicare HMO

## 2017-01-15 LAB — CBC WITH DIFFERENTIAL/PLATELET
BASOS ABS: 0 10*3/uL (ref 0.0–0.1)
BASOS PCT: 0 %
EOS ABS: 0 10*3/uL (ref 0.0–0.7)
Eosinophils Relative: 0 %
HCT: 36.1 % — ABNORMAL LOW (ref 39.0–52.0)
Hemoglobin: 13.2 g/dL (ref 13.0–17.0)
Lymphocytes Relative: 3 %
Lymphs Abs: 0.3 10*3/uL — ABNORMAL LOW (ref 0.7–4.0)
MCH: 34.2 pg — ABNORMAL HIGH (ref 26.0–34.0)
MCHC: 36.6 g/dL — AB (ref 30.0–36.0)
MCV: 93.5 fL (ref 78.0–100.0)
MONO ABS: 0.3 10*3/uL (ref 0.1–1.0)
MONOS PCT: 4 %
NEUTROS ABS: 9.1 10*3/uL — AB (ref 1.7–7.7)
Neutrophils Relative %: 93 %
PLATELETS: 200 10*3/uL (ref 150–400)
RBC: 3.86 MIL/uL — ABNORMAL LOW (ref 4.22–5.81)
RDW: 13 % (ref 11.5–15.5)
WBC: 9.8 10*3/uL (ref 4.0–10.5)

## 2017-01-15 LAB — COMPREHENSIVE METABOLIC PANEL
ALBUMIN: 2.3 g/dL — AB (ref 3.5–5.0)
ALT: 28 U/L (ref 17–63)
ANION GAP: 12 (ref 5–15)
AST: 26 U/L (ref 15–41)
Alkaline Phosphatase: 53 U/L (ref 38–126)
BILIRUBIN TOTAL: 0.9 mg/dL (ref 0.3–1.2)
BUN: 83 mg/dL — AB (ref 6–20)
CHLORIDE: 99 mmol/L — AB (ref 101–111)
CO2: 27 mmol/L (ref 22–32)
Calcium: 8.3 mg/dL — ABNORMAL LOW (ref 8.9–10.3)
Creatinine, Ser: 3.23 mg/dL — ABNORMAL HIGH (ref 0.61–1.24)
GFR calc Af Amer: 20 mL/min — ABNORMAL LOW (ref >60)
GFR calc non Af Amer: 17 mL/min — ABNORMAL LOW (ref >60)
GLUCOSE: 246 mg/dL — AB (ref 65–99)
POTASSIUM: 3.2 mmol/L — AB (ref 3.5–5.1)
SODIUM: 138 mmol/L (ref 135–145)
TOTAL PROTEIN: 4.7 g/dL — AB (ref 6.5–8.1)

## 2017-01-15 LAB — GLUCOSE, CAPILLARY
GLUCOSE-CAPILLARY: 222 mg/dL — AB (ref 65–99)
GLUCOSE-CAPILLARY: 242 mg/dL — AB (ref 65–99)
GLUCOSE-CAPILLARY: 265 mg/dL — AB (ref 65–99)
GLUCOSE-CAPILLARY: 266 mg/dL — AB (ref 65–99)
Glucose-Capillary: 129 mg/dL — ABNORMAL HIGH (ref 65–99)

## 2017-01-15 LAB — MAGNESIUM: Magnesium: 1.8 mg/dL (ref 1.7–2.4)

## 2017-01-15 LAB — PHOSPHORUS: Phosphorus: 4.8 mg/dL — ABNORMAL HIGH (ref 2.5–4.6)

## 2017-01-15 LAB — PROTIME-INR
INR: 3.53
PROTHROMBIN TIME: 36.2 s — AB (ref 11.4–15.2)

## 2017-01-15 MED ORDER — DILTIAZEM HCL ER COATED BEADS 180 MG PO CP24
180.0000 mg | ORAL_CAPSULE | Freq: Two times a day (BID) | ORAL | Status: DC
  Administered 2017-01-15 – 2017-01-18 (×7): 180 mg via ORAL
  Filled 2017-01-15 (×7): qty 1

## 2017-01-15 MED ORDER — TORSEMIDE 20 MG PO TABS: 20.0000 mg | ORAL_TABLET | Freq: Every day | ORAL | Status: DC

## 2017-01-15 MED ORDER — POTASSIUM CHLORIDE CRYS ER 20 MEQ PO TBCR
40.0000 meq | EXTENDED_RELEASE_TABLET | Freq: Two times a day (BID) | ORAL | Status: DC
  Administered 2017-01-15: 40 meq via ORAL
  Filled 2017-01-15: qty 2

## 2017-01-15 MED ORDER — DOXYLAMINE SUCCINATE (SLEEP) 25 MG PO TABS
25.0000 mg | ORAL_TABLET | Freq: Every evening | ORAL | Status: DC | PRN
  Filled 2017-01-15: qty 1

## 2017-01-15 NOTE — Progress Notes (Signed)
Subjective: Interval History: Patient is feeling much better. It is still some flank pain.  Objective: Vital signs in last 24 hours: BP: (101-139)/(68-88) 101/68 (03/05 0528) SpO2:  [93 %-96 %] 96 % (03/05 0748) Weight:  [79.9 kg (176 lb 2.4 oz)] 79.9 kg (176 lb 2.4 oz) (03/05 0500) Weight change: -3 kg (-6 lb 9.8 oz)  Intake/Output from previous day: 03/04 0701 - 03/05 0700 In: 700 [P.O.:650; IV Piggyback:50] Out: 9373 [Urine:3100; Stool:1] Intake/Output this shift: No intake/output data recorded.  General appearance: alert, cooperative and no distress Resp: diminished breath sounds bilaterally and wheezes bilaterally Cardio: regular rate and rhythm Extremities: No edema  Lab Results:  Recent Labs  01/14/17 0434 01/15/17 0429  WBC 10.1 9.8  HGB 12.7* 13.2  HCT 35.0* 36.1*  PLT 200 200   BMET:   Recent Labs  01/14/17 0434 01/15/17 0429  NA 139 138  K 3.3* 3.2*  CL 106 99*  CO2 24 27  GLUCOSE 179* 246*  BUN 81* 83*  CREATININE 2.92* 3.23*  CALCIUM 8.3* 8.3*   No results for input(s): PTH in the last 72 hours. Iron Studies: No results for input(s): IRON, TIBC, TRANSFERRIN, FERRITIN in the last 72 hours.  Studies/Results: Dg Chest Port 1 View  Result Date: 01/15/2017 CLINICAL DATA:  Shortness of breath. EXAM: PORTABLE CHEST 1 VIEW COMPARISON:  Radiographs January 14, 2017. FINDINGS: The heart size and mediastinal contours are within normal limits. No pneumothorax or pleural effusion is noted. Mild central pulmonary vascular congestion is noted. The visualized skeletal structures are unremarkable. IMPRESSION: Mild central pulmonary vascular congestion. Electronically Signed   By: Marijo Conception, M.D.   On: 01/15/2017 07:42   Dg Chest Port 1 View  Result Date: 01/14/2017 CLINICAL DATA:  Shortness of breath. EXAM: PORTABLE CHEST 1 VIEW COMPARISON:  Radiograph of January 05, 2017. FINDINGS: The heart size and mediastinal contours are within normal limits. Both lungs  are clear. No pneumothorax or pleural effusion is noted. The visualized skeletal structures are unremarkable. IMPRESSION: No acute cardiopulmonary abnormality seen. Electronically Signed   By: Marijo Conception, M.D.   On: 01/14/2017 07:34    I have reviewed the patient's current medications.  Assessment/Plan: Problem #1 acute kidney injury superimposed on chronic.  Possibly secondary to ATN versus cardiorenal. His creatinine has increased today possibly from aggressive fluid removal. Problem #2 chronic renal failure: Possibly early stage IV versus late stage III. Problem #3 difficulty breathing: Possibly a combination of influenza and exacerbation of COPD. patient is improving. Patient was switched to Demadex 40 mg by mouth once a day. Patient had 3100 mL of urine output. Problem #4 metabolic bone disease: His calcium and phosphorus is range. Problem #5 hypertension: His blood pressure is reasonably controlled Problem #6 history of a trial fibrillation:  His heart rate seems to be better controlled. Problem #7 hyponatremia: Possibly hypovolemic hyponatremia :  His sodium has corrected. Problem #8 hypokalemia: Most likely from Lasix. Patient on potassium 40 mEq once a day. His potassium remains low. Plan: 1] will change potassium to 40 mEq by mouth twice a day            2] will decrease Demadex to 20 mg once a day.           3] will check his renal panel in the morning.    LOS: 10 days   Yalanda Soderman S 01/15/2017,10:46 AM

## 2017-01-15 NOTE — Progress Notes (Signed)
Inpatient Diabetes Program Recommendations  AACE/ADA: New Consensus Statement on Inpatient Glycemic Control (2015)  Target Ranges:  Prepandial:   less than 140 mg/dL      Peak postprandial:   less than 180 mg/dL (1-2 hours)      Critically ill patients:  140 - 180 mg/dL   Results for Don [name], Don [name] (MRN [id number]) as of 01/15/2017 08:29  Ref. Range 01/14/2017 08:10 01/14/2017 11:19 01/14/2017 16:32 01/14/2017 23:07 01/15/2017 00:57 01/15/2017 08:11  Glucose-Capillary Latest Ref Range: 65 - 99 mg/dL 197 (H) 200 (H) 182 (H) 234 (H) 242 (H) 266 (H)   Review of Glycemic Control  Diabetes history: Prediabetes Outpatient Diabetes medications: None Current orders for Inpatient glycemic control: Novolog 0-15 units TID with meals, Novolog 0-5 units QHS  Inpatient Diabetes Program Recommendations: Insulin - Meal Coverage: While ordered steroids, please consider ordering Novolog 4 units TID with meals for meal coverage if patient eats at least 50% of meals.  Thanks, Barnie Alderman, RN, MSN, CDE Diabetes Coordinator Inpatient Diabetes Program [phone] (Team Pager from 8am to 5pm)

## 2017-01-15 NOTE — Evaluation (Signed)
Physical Therapy Evaluation Patient Details Name: Don [name] MRN: [id number] DOB: Mar 02, 1941 Today's Date: 01/15/2017   History of Present Illness  76 year old male with history of COPD and tobacco abuse, came to the emergency room with complaints of shortness of breath. Found to have COPD exacerbation and tested positive for influenza. He also had rapid atrial fibrillation requiring Cardizem infusion. Started on steroids, bronchodilators and antibiotics. Respiratory status has been slow to improve. Rapid atrial fibrillation being driven by respiratory status. Cardiology following. Also has AKI on CKD3 and nephrology is following. Slowly improving respiratory wise and Cr steadily trending down very slowly. Still SOB and states he is having Left sided pain when coughing. Stable to transfer to the Medical Floor with Telemetry today.  Dx: Acute respiratory failure with hypoxia related to COPD exacerbation and Influenza A    Clinical Impression  Pt received in bed, and is agreeable to PT evaluation.  Pt is normally independent with ambulation, as well as ADL's, IADL's, and driving.  He currently lives alone because his wife is at [place].  During PT evaluation, he demonstrates bed mobility at modified independent level, and sit<>stand with min guard and RW.  He was able to ambulate 52f with RW and Min guard.  Further distance limited due to fatigue.  Pt is recommended for HHPT with 24/7 supervision/assistance vs ALF with HHPT at this time.      Follow Up Recommendations Home health PT;Supervision/Assistance - 24 hour;Other (comment) (vs ALF)    Equipment Recommendations  None recommended by PT    Recommendations for Other Services       Precautions / Restrictions Precautions Precautions: Fall Precaution Comments: Due to immobility Restrictions Weight Bearing Restrictions: No      Mobility  Bed Mobility Overal bed mobility: Modified Independent                 Transfers Overall transfer level: Needs assistance Equipment used: Rolling walker (2 wheeled) Transfers: Sit to/from Stand Sit to Stand: Min guard         General transfer comment: Upon standing, it was noted that pt had been incontinent of stool.  Pt states that he is not always able to tell when he needs to have a BM.   Ambulation/Gait Ambulation/Gait assistance: Min guard Ambulation Distance (Feet): 20 Feet Assistive device: Rolling walker (2 wheeled) Gait Pattern/deviations: Step-through pattern;Trunk flexed   Gait velocity interpretation: <1.8 ft/sec, indicative of risk for recurrent falls    Stairs            Wheelchair Mobility    Modified Rankin (Stroke Patients Only)       Balance Overall balance assessment: Needs assistance Sitting-balance support: Bilateral upper extremity supported;Feet supported Sitting balance-Leahy Scale: Good     Standing balance support: Bilateral upper extremity supported Standing balance-Leahy Scale: Fair                               Pertinent Vitals/Pain Pain Assessment: No/denies pain    Home Living   Living Arrangements: Alone (W[facility] Available Help at Discharge: Available PRN/intermittently (Friends and 2 sons that live nearby, but they work. ) Type of Home: Mobile home Home Access: Ramped entrance     [place] Wheel[place] - [employer]- 2 wheels      Prior Function     Gait / Transfers Assistance Needed: independent  ADL's /  Homemaking Assistance Needed: independent  Comments: Driving     Hand Dominance   Dominant Hand: Left    Extremity/Trunk Assessment   Upper Extremity Assessment Upper Extremity Assessment: Overall WFL for tasks assessed    Lower Extremity Assessment Lower Extremity Assessment: Overall WFL for tasks assessed       Communication   Communication: No difficulties  Cognition Arousal/Alertness:  Awake/alert Behavior During Therapy: WFL for tasks assessed/performed Overall Cognitive Status: Within Functional Limits for tasks assessed                      General Comments      Exercises     Assessment/Plan    PT Assessment Patient needs continued PT services  PT Problem List Decreased strength;Decreased mobility;Decreased activity tolerance;Decreased balance;Decreased safety awareness;Decreased knowledge of precautions;Cardiopulmonary status limiting activity       PT Treatment Interventions DME instruction;Gait training;Functional mobility training;Therapeutic activities;Therapeutic exercise;Patient/family education;Balance training    PT Goals (Current goals can be found in the Care Plan section)  Acute Rehab PT Goals Patient Stated Goal: Pt wants to get stronger PT Goal Formulation: With patient Time For Goal Achievement: 01/22/17 Potential to Achieve Goals: Good    Frequency Min 3X/week   Barriers to discharge Decreased caregiver support Pt lives alone - his wife is at [facility] and his 2 sons work during the day.      Co-evaluation               End of Session Equipment Utilized During Treatment: Gait belt;Oxygen Activity Tolerance: Patient limited by fatigue Patient left: in chair;with call bell/phone within reach Nurse Communication: Mobility status (mobility sheet left hanging in the room. ) PT Visit Diagnosis: Other abnormalities of gait and mobility (R26.89);Muscle weakness (generalized) (M62.81)    Functional Assessment Tool Used: AM-PAC 6 Clicks Basic Mobility;Clinical judgement Functional Limitation: Mobility: Walking and moving around Mobility: Walking and Moving Around Current Status (F3744): At least 20 percent but less than 40 percent impaired, limited or restricted Mobility: Walking and Moving Around Goal Status [phone]): At least 1 percent but less than 20 percent impaired, limited or restricted    Time: 4799-8721 PT Time  Calculation (min) (ACUTE ONLY): 27 min   Charges:         PT G Codes:   PT G-Codes **NOT FOR INPATIENT CLASS** Functional Assessment Tool Used: AM-PAC 6 Clicks Basic Mobility;Clinical judgement Functional Limitation: Mobility: Walking and moving around Mobility: Walking and Moving Around Current Status (L8727): At least 20 percent but less than 40 percent impaired, limited or restricted Mobility: Walking and Moving Around Goal Status [phone]): At least 1 percent but less than 20 percent impaired, limited or restricted     Beth Akeisha Lagerquist, PT, DPT X: [phone]

## 2017-01-15 NOTE — Progress Notes (Signed)
[facility] for Coumadin (chronic Rx PTA) Indication: atrial fibrillation  Allergies  Allergen Reactions  . Wellbutrin [Bupropion] Anxiety    Patient Measurements: Height: '5\' 9"'$  (175.3 cm) Weight: 176 lb 2.4 oz (79.9 kg) IBW/kg (Calculated) : 70.7  Vital Signs: BP: 101/68 (03/05 0528)  Labs:  Recent Labs  01/13/17 0540 01/14/17 0434 01/15/17 0429  HGB 12.4* 12.7* 13.2  HCT 34.8* 35.0* 36.1*  PLT 201 200 200  LABPROT 24.8* 28.1* 36.2*  INR 2.20 2.57 3.53  CREATININE 3.09*  3.02* 2.92* 3.23*    Estimated Creatinine Clearance: 19.2 mL/min (by C-G formula based on SCr of 3.23 mg/dL (H)).   Medical History: Past Medical History:  Diagnosis Date  . Acute bronchitis 04/03/2014  . Anxiety   . Atrial fibrillation ([name])   . Cataract   . COPD (chronic obstructive pulmonary disease) ([place])   . Essential hypertension   . Hyperlipidemia   . Macular degeneration   . Noncompliance with medications 04/2013   Xarelto, digoxin previously  . Pre-diabetes   . Prostate cancer ([place])   . Stage III chronic kidney disease 04/03/2014  . Tubular adenoma of colon 07/31/02, 11/18/03    Medications:  Prescriptions Prior to Admission  Medication Sig Dispense Refill Last Dose  . albuterol (PROVENTIL HFA;VENTOLIN HFA) 108 (90 BASE) MCG/ACT inhaler Inhale 2 puffs into the lungs every 6 (six) hours as needed for wheezing or shortness of breath. 1 Inhaler 2 Past Week at Unknown time  . ALLERGY RELIEF 10 MG tablet TAKE 1 TABLET DAILY FOR ALLERGY 30 tablet 5 Past Week at Unknown time  . atorvastatin (LIPITOR) 20 MG tablet TAKE 1 TABLET DAILY 30 tablet 2 Past Week at Unknown time  . budesonide-formoterol (SYMBICORT) 160-4.5 MCG/ACT inhaler Inhale 2 puffs into the lungs 2 (two) times daily. 1 Inhaler 3 Past Week at Unknown time  . clonazePAM (KLONOPIN) 1 MG tablet TAKE 1/2 TABLET ONCE DAILY AS NEEDED FOR ANXIETY 15 tablet 0 Past Week at Unknown time  . diltiazem  (CARDIZEM CD) 300 MG 24 hr capsule TAKE (1) CAPSULE DAILY 30 capsule 1 Past Week at Unknown time  . doxycycline (ADOXA) 100 MG tablet Take 1 tablet (100 mg total) by mouth 2 (two) times daily. 20 tablet 0 Past Week at Unknown time  . doxycycline (VIBRA-TABS) 100 MG tablet Take 1 tablet by mouth 2 (two) times daily.   Past Week at Unknown time  . doxylamine, Sleep, (UNISOM) 25 MG tablet Take 25 mg by mouth at bedtime as needed.   Past Week at Unknown time  . DULoxetine (CYMBALTA) 60 MG capsule TAKE (1) CAPSULE DAILY 30 capsule 1 Past Week at Unknown time  . FERREX 150 150 MG capsule Take 1 capsule by mouth daily.   Past Week at Unknown time  . furosemide (LASIX) 40 MG tablet Take 1 tablet by mouth every other day.    Past Week at Unknown time  . metoprolol tartrate (LOPRESSOR) 25 MG tablet TAKE (1) TABLET TWICE A DAY. 60 tablet 4 Past Week at Unknown time  . Multiple Vitamin (MULTIVITAMIN WITH MINERALS) TABS Take 1 tablet by mouth daily.   Past Week at Unknown time  . nitroGLYCERIN (NITROSTAT) 0.4 MG SL tablet Place 1 tablet (0.4 mg total) under the tongue every 5 (five) minutes as needed for chest pain. 25 tablet 2 Past Week at Unknown time  . omega-3 acid ethyl esters (LOVAZA) 1 G capsule TAKE (2) CAPSULES TWICE DAILY. 120 capsule 2  Past Week at Unknown time  . pantoprazole (PROTONIX) 40 MG tablet TAKE 1 TABLET DAILY 30 tablet 2 Past Week at Unknown time  . Polyethyl Glycol-Propyl Glycol (SYSTANE OP) Place 1 drop into both eyes 2 (two) times daily as needed (dry eyes).    Past Week at Unknown time  . Probiotic Product (PRO-BIOTIC BLEND) CAPS Take 1 capsule by mouth daily.   Past Week at Unknown time  . terazosin (HYTRIN) 2 MG capsule TAKE 1 CAPSULE AT BEDTIME 30 capsule 4 Past Week at Unknown time  . tiotropium (SPIRIVA) 18 MCG inhalation capsule Place 1 capsule (18 mcg total) into inhaler and inhale daily. 30 capsule 3 Past Week at Unknown time  . triamcinolone cream (KENALOG) 0.1 % Apply 1  application topically 2 (two) times daily. 80 g 0 Past Week at Unknown time  . warfarin (COUMADIN) 4 MG tablet Take 1 and 1/2 tablets (= '6mg'$ ) daily except on Sundays and Thursdays take 1 tablet (='4mg'$ ) 45 tablet 0 Past Week at Unknown time  . Incontinence Supply Disposable (INCONTINENCE BRIEF LARGE) MISC Use as needed for urinary incontinence.  Dx: urinary incontinence R32 and prostate cancer C61 200 each 1 Taking   Assessment: 76yo male on chronic Coumadin PTA.  INR is elevated at 3.53 today. Patient will probably only need '4mg'$  daily, or possibly less at discharge.   Goal of Therapy:  INR 2-3   Plan:  No Coumadin today. PT-INR daily Monitor for s/s of bleeding  Isac Sarna, BS Vena [place], BCPS Clinical Pharmacist Pager [phone] 01/15/2017,8:13 AM

## 2017-01-15 NOTE — Plan of Care (Signed)
Problem: Pain Managment: Goal: General experience of comfort will improve Outcome: Progressing Patient side has begun to hurt. We discussed repositioning and how it can help also gave patient a pillow for support. Patient stated that this helped.

## 2017-01-15 NOTE — Progress Notes (Signed)
   Dr McDowell's rounding note reviewed. Patient admitted with afib with RVR in setting of influenza and COPD exacerbation. Has been on bisoprolol '5mg'$  bid and dilt '90mg'$  q 6 hrs. Rates improving but still elevated at times. We will change dilt to long acting '180mg'$  bid. Hopefully as respiratory issues improve rates will continue to trend down. We will contiue to follow telemetry.          Carlyle Dolly, M.D.,

## 2017-01-15 NOTE — Progress Notes (Signed)
PROGRESS NOTE    CROSS JORGE  EXH:[id number] DOB: 1941-06-11 DOA: 01/05/2017 PCP: Wardell Honour, MD   Brief Narrative:  76 year old male with history of COPD and tobacco abuse, came to the emergency room with complaints of shortness of breath. Found to have COPD exacerbation and tested positive for influenza. He also had rapid atrial fibrillation requiring Cardizem infusion. Started on steroids, bronchodilators and antibiotics. Respiratory status has been slow to improve. Rapid atrial fibrillation being driven by respiratory status. Cardiology following. Also has AKI on CKD3 and nephrology is following. Slowly improving respiratory wise and Cr steadily trending down very slowly. Still SOB and states he is having Left sided pain when coughing. Stable to transfer to the Medical Floor with Telemetry today.    Assessment & Plan:   Active Problems:   Hypertension   Tobacco abuse   Stage III chronic kidney disease   COPD exacerbation (HCC)   Atrial fibrillation with RVR (HCC)   Acute respiratory failure with hypoxia (HCC)   AKI (acute kidney injury) (HCC)   Influenza A   Respiratory distress   SOB (shortness of breath)  1. Acute respiratory failure with hypoxia related to COPD exacerbation and Influenza A. stable -We'll start to wean off oxygen as his clinical condition improves.  -Maintain O2 Saturations >92% -Will likely need 6 minute walk screen prior to D/C when stable.  -Still requiring O2 and significantly rhodochrous and wheezing  2. COPD exacerbation.  -Continue the patient on IV Methylprednisolone 60 mg q6h, D/C'd Abx with Levofloxacin 500 mg po q48h as treatment is complete. C/w bronchodilators with Xopenex 0.63 mg Neb 4 Times Daily and 0.63 mg q2hprn and Ipratropium 0.5 mg Neb 4 times daily.  -Continues to wheeze and is short of breath significantly. Continue pulmonary hygiene. -C/w Dulera 2 puff IH BID and Gauifenesin 1200 po BID -C/w Pantoprazole 40 mg po Daily while  on IV Steroids  -C/w Flutter Valve and Incentive Spirometry -C/w Chest Physiotherapy q4h while awake -Will order Hypertonic Saline Nebs however discussed with Pharmacy and only have 0.9% Nebs so changed to that -C/w Guaifenesin 1,200 mg po BID -Has Left sided flank/abdominal Pain from coughing -Repeat CXR this AM showed mild central pulmonary vascular congestion  3. Atrial fibrillation with RVR. stable -Likely precipitated by underlying respiratory issues.  -He does take Metoprolol at home, which is being held for now due to significant bronchospasms. He also takes diltiazem.  -Diltazem 90 mg po q6h changed to Long-Acting Diltiazem 180 mg BID by Cardiology -C/w Bisoprolol 5 mg po BID;  -Blood pressures are low making further titration of medications challenging.  -Digoxin not good option due to renal issues.  -Cardiology following and appreciate Recc's -He is anticoagulated with Coumadin and continue Coumadin per Pharmacy (they will give him 4 mg of Coumadin tonight as PT was 28.1 and INR was 2.57) -TSH normal. Echocardiogram unremarkable, EF 60-65%. -Stable to transfer to the Floor with Telemetry  4. AKI on CKD3/4.  -BUN/Cr was trending down but went from 81/2.92 -> 83/3.23 overnight -Nephrology stopped patient's IV Lasix and changed Torsemide 40 mg po Daily yesterday but are decreasing down to 20 mg of po Torsemide Daily -Nephrology Dr. Lowanda Foster following and appreciate Recc's.  -Avoid Nephrotoxics -Follow Strict Urine output. Will place Condom Catheter to accurately measure  -Repeat CMP in AM  5. Hypertension.  -Blood pressures currently stable.  -Continue current treatments with Diltiazem and Bisoprolol.    6. Hypokalemia -Likely from IV Lasix -Patient's K+ was 3.2 -  Replete with 40 mEQ of KCL po BID -Repeat CMP in AM  7. Hypomagnesemia -Patient's Mag Level was 1.4; Now improved to 1.8 -Repeat Mag Level in AM  8. Tobacco Use.  -Counseled on importance of tobacco  cessation.  9. Influenza A.  -C/w Droplet Precautions while in the Hospital  -S/p Oseltamivir 30 mg po Daily x 5 days because of AKI  10. Hyperglycemia. -Likely related to IV Steroids. -HbA1c was 6.0.  -CBG's have been running from 129-266 -C/w Moderate Novolog SSI AC Hs; May Increase to Resistant Scale  11. Leukocytosis  -Improved -WBC went from 14.4 -> 12.1 -> 8.2 -> 7.8 -> 8.0 -> 8.6 -> 10.1 -> 9.8 -Repeat CBC in AM  12. Hyponatremia, improved -Improved to 138 -Likely from Hypovolemia -Continue to Trend CMP's  13. Constipation, improved -Scheduled Miralax 17 grams po BID -Started Senna-Doucsate 1 tab po BID -Gave Bisacodyl Suppository x1 dose  DVT prophylaxis: Anticoagulated with Coumadin Code Status: Partial Code, no CPR, No defibrilliation Family Communication: No family present at bedside Disposition Plan: Likely Home at D/C; Stable to transfer to the Medical Floor with Telemetry  Consultants:   Cardiology  Nephrology  Procedures:   ECHOCARDIOGRAM 01/05/17 Study Conclusions  - Left ventricle: The cavity size was normal. Systolic function was   normal. The estimated ejection fraction was in the range of 60%   to 65%. Wall motion was normal; there were no regional wall   motion abnormalities. The study was not technically sufficient to   allow evaluation of LV diastolic dysfunction due to atrial   fibrillation. Mild concentric and moderate focal basal septal   hypertrophy. - Aortic valve: Trileaflet; mildly calcified leaflets. There was no   stenosis. - Mitral valve: Moderately calcified annulus. - Left atrium: The atrium was mildly dilated.   Antimicrobials:  Anti-infectives    Start     Dose/Rate Route Frequency Ordered Stop   01/09/17 1400  levofloxacin (LEVAQUIN) tablet 500 mg  Status:  Discontinued     500 mg Oral Every 48 hours 01/08/17 1345 01/11/17 1807   01/05/17 2200  oseltamivir (TAMIFLU) capsule 75 mg  Status:  Discontinued     75 mg  Oral 2 times daily 01/05/17 1836 01/05/17 1848   01/05/17 2000  oseltamivir (TAMIFLU) capsule 30 mg     30 mg Oral Daily 01/05/17 1848 01/09/17 1954   01/05/17 1400  levofloxacin (LEVAQUIN) tablet 750 mg  Status:  Discontinued     750 mg Oral Every 48 hours 01/05/17 1333 01/08/17 1345     Subjective: States he was feeling better and breathing seemed better. Still had some flank pain when coughing. No nausea or vomiting. No other concerns or complaints at this time.    Objective: Vitals:   01/15/17 1000 01/15/17 1133 01/15/17 1452 01/15/17 1518  BP: 127/80  123/85   Pulse: 94  90   Resp:   16   Temp:   98.1 F (36.7 C)   TempSrc:   Oral   SpO2: 93% 94% 96% 94%  Weight:      Height:        Intake/Output Summary (Last 24 hours) at 01/15/17 1657 Last data filed at 01/15/17 0500  Gross per 24 hour  Intake                0 ml  Output             3100 ml  Net            -  3100 ml   Filed Weights   01/13/17 0548 01/14/17 0500 01/15/17 0500  Weight: 84.4 kg (186 lb 1.1 oz) 82.9 kg (182 lb 12.2 oz) 79.9 kg (176 lb 2.4 oz)   Examination: Physical Exam:  Constitutional: Appears calm and comfortable and in No distress Eyes: Sclerae anicteric  ENMT: External Ears, Nose appear normal. Grossly normal hearing.  Neck: Appears normal, supple, no cervical masses, normal ROM, no appreciable thyromegaly or JVD Respiratory: Diminished to auscultation bilaterally with some expiratory wheezing and rhonchus breath sounds. Slightly increased respiratory effort. No accessory muscle use. Wearing Supplemental O2 via [place].  Cardiovascular: Irregularly Irregular, no murmurs / rubs / gallops. S1 and S2 auscultated. Mild extremity edema.  Abdomen: Soft, non-tender, non-distended. No masses palpated. No appreciable hepatosplenomegaly. Bowel sounds positive x4.  GU: Deferred. Musculoskeletal: No clubbing / cyanosis of digits/nails. No joint deformity upper and lower extremities.  Skin: No rashes, lesions,  ulcers on limited skin eval. No induration; Warm and dry. Upper arm erythema and slight bruising noted.  Neurologic: CN 2-12 grossly intact with no focal deficits. Romberg sign cerebellar reflexes not assessed.  Psychiatric: Normal judgment and insight. Alert and oriented x 3. Normal mood and appropriate affect.   Data Reviewed: I have personally reviewed following labs and imaging studies  CBC:  Recent Labs Lab 01/11/17 0441 01/12/17 0422 01/13/17 0540 01/14/17 0434 01/15/17 0429  WBC 7.8 8.0 8.6 10.1 9.8  NEUTROABS 7.0 7.4 7.9* 9.4* 9.1*  HGB 12.0* 12.1* 12.4* 12.7* 13.2  HCT 33.2* 33.5* 34.8* 35.0* 36.1*  MCV 94.6 94.6 94.8 94.3 93.5  PLT 158 173 201 200 354   Basic Metabolic Panel:  Recent Labs Lab 01/11/17 0441 01/12/17 0422 01/13/17 0540 01/14/17 0434 01/15/17 0429  NA 133* 136 137  138 139 138  K 3.9 4.0 3.6  3.6 3.3* 3.2*  CL 101 106 108  108 106 99*  CO2 21* 20* 19*  20* 24 27  GLUCOSE 211* 174* 208*  210* 179* 246*  BUN 86* 84* 79*  78* 81* 83*  CREATININE 3.80* 3.35* 3.09*  3.02* 2.92* 3.23*  CALCIUM 7.8* 7.9* 8.1*  8.1* 8.3* 8.3*  MG 1.3*  --  1.3* 1.4* 1.8  PHOS 5.1* 4.1 4.3  4.3 4.4 4.8*   GFR: Estimated Creatinine Clearance: 19.2 mL/min (by C-G formula based on SCr of 3.23 mg/dL (H)). Liver Function Tests:  Recent Labs Lab 01/11/17 0441 01/12/17 0422 01/13/17 0540 01/14/17 0434 01/15/17 0429  AST 41  --  '30 27 26  '$ ALT 33  --  '30 29 28  '$ ALKPHOS 48  --  48 51 53  BILITOT 0.7  --  0.9 0.8 0.9  PROT 4.8*  --  4.7* 4.8* 4.7*  ALBUMIN 2.3* 2.2* 2.3*  2.2* 2.4* 2.3*   No results for input(s): LIPASE, AMYLASE in the last 168 hours. No results for input(s): AMMONIA in the last 168 hours. Coagulation Profile:  Recent Labs Lab 01/11/17 0441 01/12/17 0422 01/13/17 0540 01/14/17 0434 01/15/17 0429  INR 2.58 2.17 2.20 2.57 3.53   Cardiac Enzymes: No results for input(s): CKTOTAL, CKMB, CKMBINDEX, TROPONINI in the last 168  hours. BNP (last 3 results) No results for input(s): PROBNP in the last 8760 hours. HbA1C: No results for input(s): HGBA1C in the last 72 hours. CBG:  Recent Labs Lab 01/14/17 2307 01/15/17 0057 01/15/17 0811 01/15/17 1141 01/15/17 1614  GLUCAP 234* 242* 266* 265* 129*   Lipid Profile: No results for input(s): CHOL, HDL, LDLCALC, TRIG, CHOLHDL,  LDLDIRECT in the last 72 hours. Thyroid Function Tests: No results for input(s): TSH, T4TOTAL, FREET4, T3FREE, THYROIDAB in the last 72 hours. Anemia Panel: No results for input(s): VITAMINB12, FOLATE, FERRITIN, TIBC, IRON, RETICCTPCT in the last 72 hours. Sepsis Labs: No results for input(s): PROCALCITON, LATICACIDVEN in the last 168 hours.  No results found for this or any previous visit (from the past 240 hour(s)).  Radiology Studies: Dg Chest Port 1 View  Result Date: 01/15/2017 CLINICAL DATA:  Shortness of breath. EXAM: PORTABLE CHEST 1 VIEW COMPARISON:  Radiographs January 14, 2017. FINDINGS: The heart size and mediastinal contours are within normal limits. No pneumothorax or pleural effusion is noted. Mild central pulmonary vascular congestion is noted. The visualized skeletal structures are unremarkable. IMPRESSION: Mild central pulmonary vascular congestion. Electronically Signed   By: Marijo Conception, M.D.   On: 01/15/2017 07:42   Dg Chest Port 1 View  Result Date: 01/14/2017 CLINICAL DATA:  Shortness of breath. EXAM: PORTABLE CHEST 1 VIEW COMPARISON:  Radiograph of January 05, 2017. FINDINGS: The heart size and mediastinal contours are within normal limits. Both lungs are clear. No pneumothorax or pleural effusion is noted. The visualized skeletal structures are unremarkable. IMPRESSION: No acute cardiopulmonary abnormality seen. Electronically Signed   By: Marijo Conception, M.D.   On: 01/14/2017 07:34    Scheduled Meds: . atorvastatin  20 mg Oral q1800  . bisacodyl  10 mg Rectal Once  . bisoprolol  5 mg Oral BID  . diltiazem  180  mg Oral BID  . DULoxetine  60 mg Oral Daily  . guaiFENesin  1,200 mg Oral BID  . insulin aspart  0-15 Units Subcutaneous TID WC  . insulin aspart  0-5 Units Subcutaneous QHS  . ipratropium  0.5 mg Nebulization QID  . iron polysaccharides  150 mg Oral Daily  . levalbuterol  0.63 mg Nebulization QID  . mouth rinse  15 mL Mouth Rinse BID  . methylPREDNISolone (SOLU-MEDROL) injection  60 mg Intravenous Q6H  . mometasone-formoterol  2 puff Inhalation BID  . omega-3 acid ethyl esters  2 g Oral BID  . pantoprazole  40 mg Oral Daily  . polyethylene glycol  17 g Oral BID  . potassium chloride  40 mEq Oral BID  . senna-docusate  1 tablet Oral BID  . sodium chloride  4 mL Nebulization Daily  . terazosin  2 mg Oral QHS  . [START ON 01/16/2017] torsemide  20 mg Oral Daily  . Warfarin - Pharmacist Dosing Inpatient   Does not apply Q24H   Continuous Infusions:   LOS: 10 days   Kerney Elbe, DO [facility] Pager [phone]  If 7PM-7AM, please contact night-coverage [email or web address] Password [facility] 01/15/2017, 4:57 PM

## 2017-01-15 NOTE — Progress Notes (Signed)
Report called to Lattie Haw, RN on 300. Will take patient up to floor after he eats lunch.

## 2017-01-16 LAB — COMPREHENSIVE METABOLIC PANEL
ALT: 29 U/L (ref 17–63)
AST: 27 U/L (ref 15–41)
Albumin: 2.4 g/dL — ABNORMAL LOW (ref 3.5–5.0)
Alkaline Phosphatase: 54 U/L (ref 38–126)
Anion gap: 13 (ref 5–15)
BILIRUBIN TOTAL: 0.8 mg/dL (ref 0.3–1.2)
BUN: 96 mg/dL — AB (ref 6–20)
CHLORIDE: 98 mmol/L — AB (ref 101–111)
CO2: 27 mmol/L (ref 22–32)
Calcium: 8.5 mg/dL — ABNORMAL LOW (ref 8.9–10.3)
Creatinine, Ser: 3.44 mg/dL — ABNORMAL HIGH (ref 0.61–1.24)
GFR, EST AFRICAN AMERICAN: 18 mL/min — AB (ref >60)
GFR, EST NON AFRICAN AMERICAN: 16 mL/min — AB (ref >60)
Glucose, Bld: 216 mg/dL — ABNORMAL HIGH (ref 65–99)
POTASSIUM: 3.8 mmol/L (ref 3.5–5.1)
Sodium: 138 mmol/L (ref 135–145)
Total Protein: 4.8 g/dL — ABNORMAL LOW (ref 6.5–8.1)

## 2017-01-16 LAB — GLUCOSE, CAPILLARY
GLUCOSE-CAPILLARY: 218 mg/dL — AB (ref 65–99)
Glucose-Capillary: 244 mg/dL — ABNORMAL HIGH (ref 65–99)
Glucose-Capillary: 332 mg/dL — ABNORMAL HIGH (ref 65–99)
Glucose-Capillary: 241 mg/dL — ABNORMAL HIGH (ref 65–99)

## 2017-01-16 LAB — CBC WITH DIFFERENTIAL/PLATELET
BASOS ABS: 0 10*3/uL (ref 0.0–0.1)
Basophils Relative: 0 %
EOS PCT: 0 %
Eosinophils Absolute: 0 10*3/uL (ref 0.0–0.7)
HEMATOCRIT: 37.4 % — AB (ref 39.0–52.0)
Hemoglobin: 13.4 g/dL (ref 13.0–17.0)
LYMPHS ABS: 0.3 10*3/uL — AB (ref 0.7–4.0)
LYMPHS PCT: 3 %
MCH: 33.5 pg (ref 26.0–34.0)
MCHC: 35.8 g/dL (ref 30.0–36.0)
MCV: 93.5 fL (ref 78.0–100.0)
MONO ABS: 0.3 10*3/uL (ref 0.1–1.0)
MONOS PCT: 2 %
NEUTROS ABS: 10.2 10*3/uL — AB (ref 1.7–7.7)
Neutrophils Relative %: 95 %
PLATELETS: 185 10*3/uL (ref 150–400)
RBC: 4 MIL/uL — ABNORMAL LOW (ref 4.22–5.81)
RDW: 12.7 % (ref 11.5–15.5)
WBC: 10.7 10*3/uL — ABNORMAL HIGH (ref 4.0–10.5)

## 2017-01-16 LAB — PHOSPHORUS: PHOSPHORUS: 4.7 mg/dL — AB (ref 2.5–4.6)

## 2017-01-16 LAB — PROTIME-INR
INR: 3.58
Prothrombin Time: 36.6 seconds — ABNORMAL HIGH (ref 11.4–15.2)

## 2017-01-16 LAB — MAGNESIUM: MAGNESIUM: 1.6 mg/dL — AB (ref 1.7–2.4)

## 2017-01-16 MED ORDER — SODIUM CHLORIDE 0.9% FLUSH: 3.0000 mL | INTRAVENOUS | Status: DC | PRN

## 2017-01-16 MED ORDER — SODIUM CHLORIDE 0.9 % IV SOLN: 250.0000 mL | INTRAVENOUS | Status: DC | PRN

## 2017-01-16 MED ORDER — BISOPROLOL FUMARATE 5 MG PO TABS
2.5000 mg | ORAL_TABLET | Freq: Once | ORAL | Status: AC
  Administered 2017-01-16: 2.5 mg via ORAL
  Filled 2017-01-16: qty 0.5

## 2017-01-16 MED ORDER — BISOPROLOL FUMARATE 5 MG PO TABS
7.5000 mg | ORAL_TABLET | Freq: Two times a day (BID) | ORAL | Status: DC
  Administered 2017-01-16 – 2017-01-17 (×3): 7.5 mg via ORAL
  Filled 2017-01-16 (×6): qty 1.5

## 2017-01-16 MED ORDER — MAGNESIUM SULFATE 2 GM/50ML IV SOLN
2.0000 g | Freq: Once | INTRAVENOUS | Status: AC
  Administered 2017-01-16: 2 g via INTRAVENOUS
  Filled 2017-01-16: qty 50

## 2017-01-16 MED ORDER — SODIUM CHLORIDE 0.9% FLUSH
3.0000 mL | Freq: Two times a day (BID) | INTRAVENOUS | Status: DC
  Administered 2017-01-16 – 2017-01-18 (×5): 3 mL via INTRAVENOUS

## 2017-01-16 NOTE — Progress Notes (Signed)
Consulted with IP Nurse Kurtis Bushman, RN about droplet precautions. Informed patient does not need to be on droplet precautions per protocol.

## 2017-01-16 NOTE — Progress Notes (Signed)
Subjective: Interval History: Patient denies any nausea or vomiting. Still he has some flank pain on and off. He denies and difficulty breathing.    Objective: Vital signs in last 24 hours: Temp:  [97.8 F (36.6 C)-98.6 F (37 C)] 97.8 F (36.6 C) (03/06 0403) Pulse Rate:  [90-115] 115 (03/06 0403) Resp:  [16-18] 16 (03/06 0403) BP: (114-139)/(65-85) 117/65 (03/06 0403) SpO2:  [92 %-96 %] 92 % (03/06 0754) Weight change:   Intake/Output from previous day: 03/05 0701 - 03/06 0700 In: 360 [P.O.:360] Out: 1700 [Urine:1700] Intake/Output this shift: No intake/output data recorded.  General appearance: alert, cooperative and no distress Resp: diminished breath sounds bilaterally and wheezes bilaterally Cardio: regular rate and rhythm Extremities: No edema  Lab Results:  Recent Labs  01/15/17 0429 01/16/17 0521  WBC 9.8 10.7*  HGB 13.2 13.4  HCT 36.1* 37.4*  PLT 200 185   BMET:   Recent Labs  01/15/17 0429 01/16/17 0521  NA 138 138  K 3.2* 3.8  CL 99* 98*  CO2 27 27  GLUCOSE 246* 216*  BUN 83* 96*  CREATININE 3.23* 3.44*  CALCIUM 8.3* 8.5*   No results for input(s): PTH in the last 72 hours. Iron Studies: No results for input(s): IRON, TIBC, TRANSFERRIN, FERRITIN in the last 72 hours.  Studies/Results: Dg Chest Port 1 View  Result Date: 01/15/2017 CLINICAL DATA:  Shortness of breath. EXAM: PORTABLE CHEST 1 VIEW COMPARISON:  Radiographs January 14, 2017. FINDINGS: The heart size and mediastinal contours are within normal limits. No pneumothorax or pleural effusion is noted. Mild central pulmonary vascular congestion is noted. The visualized skeletal structures are unremarkable. IMPRESSION: Mild central pulmonary vascular congestion. Electronically Signed   By: Marijo Conception, M.D.   On: 01/15/2017 07:42    I have reviewed the patient's current medications.  Assessment/Plan: Problem #1 acute kidney injury superimposed on chronic.  Possibly secondary to ATN versus  cardiorenal. His Creatinine is still seems to be increasing. Patient however doesn't have any uremic sinus symptoms.  Problem #2 chronic renal failure: Possibly early stage IV versus late stage III. Problem #3 difficulty breathing: Possibly a combination of influenza and exacerbation of COPD. patient is improving. Patient was switched to Demadex 20 mg by mouth once a day. he had about 1700 mL of urine output. His breathing is much better.  Problem #4 metabolic bone disease: His calcium and phosphorus is range. Problem #5 hypertension: His blood pressure is reasonably controlled Problem #6 history of a trial fibrillation:  His heart rate seems to be better controlled. Problem #7 hyponatremia: Possibly hypovolemic hyponatremia :  His sodium has corrected. Problem #8 hypokalemia: Most likely from Demadex. he is on potassium supplement. His potassium is normal.  Plan: 1] will DC potassium supplement and Demadex.           2] will check his renal panel in the morning.    LOS: 11 days   Khristi Schiller S 01/16/2017,7:57 AM

## 2017-01-16 NOTE — Progress Notes (Signed)
Telemetry reviewed today, patient admitted with afib with RVR in setting of respiratory infection and COPD exacerbation. Now on bisoprolol '5mg'$  bid and long acting dilt '180mg'$  bid. Rates 100-120s. Hopefully as respriatory issues resolve rates will decrease. We will increase bisoprol to 7.'5mg'$  bid.   Carlyle Dolly MD

## 2017-01-16 NOTE — Care Management Note (Signed)
Case Management Note  Patient Details  Name: Don [name] MRN: [id number] Date of Birth: 01-Jan-1941   If discussed at [place] Length of Stay Meetings, dates discussed:  01/16/2017  Additional Comments:  Cleopha Indelicato, Chauncey Reading, RN 01/16/2017, 10:25 AM

## 2017-01-16 NOTE — Progress Notes (Signed)
Inpatient Diabetes Program Recommendations  AACE/ADA: New Consensus Statement on Inpatient Glycemic Control (2015)  Target Ranges:  Prepandial:   less than 140 mg/dL      Peak postprandial:   less than 180 mg/dL (1-2 hours)      Critically ill patients:  140 - 180 mg/dL  Results for ROSCOE, WITTS (MRN [id number]) as of 01/16/2017 08:05  Ref. Range 01/15/2017 08:11 01/15/2017 11:41 01/15/2017 16:14 01/15/2017 21:41 01/16/2017 07:34  Glucose-Capillary Latest Ref Range: 65 - 99 mg/dL 266 (H) 265 (H) 129 (H) 222 (H) 244 (H)    Review of Glycemic Control  Diabetes history: Prediabetes Outpatient Diabetes medications: None Current orders for Inpatient glycemic control: Novolog 0-15 units TID with meals, Novolog 0-5 units QHS  Inpatient Diabetes Program Recommendations: Insulin - Meal Coverage: While ordered steroids, please consider ordering Novolog 3 units TID with meals for meal coverage if patient eats at least 50% of meals. Insulin-Basal: If steroids are continued, please consider ordering low dose basal insulin. Recommend ordering Lantus 8 units Q24H.   Thanks, Barnie Alderman, RN, MSN, CDE Diabetes Coordinator Inpatient Diabetes Program [phone] (Team Pager from 8am to 5pm)

## 2017-01-16 NOTE — Progress Notes (Signed)
Patient repeatedly asked for "sleeping pill" at beginning of shift. No sleeping pill was ordered. Doxylamine is on his home med list. Lamar Blinks, NP paged. Doxylamine ordered, but not available. Patient notified of this. Patient has been inpatient here and receiving his ordered Klonopin nightly, not Doxylamine. Patient given Klonopin PO as ordered PRN. Patient was agreeable to this. Patient has been mostly sleeping since med given.

## 2017-01-16 NOTE — Progress Notes (Signed)
[place] for Coumadin (chronic Rx PTA) Indication: atrial fibrillation  Allergies  Allergen Reactions  . Wellbutrin [Bupropion] Anxiety    Patient Measurements: Height: '5\' 9"'$  (175.3 cm) Weight: 176 lb 2.4 oz (79.9 kg) IBW/kg (Calculated) : 70.7  Vital Signs: Temp: 97.8 F (36.6 C) (03/06 0403) Temp Source: Oral (03/06 0403) BP: 117/65 (03/06 0403) Pulse Rate: 115 (03/06 0403)  Labs:  Recent Labs  01/14/17 0434 01/15/17 0429 01/16/17 0521  HGB 12.7* 13.2 13.4  HCT 35.0* 36.1* 37.4*  PLT 200 200 185  LABPROT 28.1* 36.2* 36.6*  INR 2.57 3.53 3.58  CREATININE 2.92* 3.23* 3.44*    Estimated Creatinine Clearance: 18 mL/min (by C-G formula based on SCr of 3.44 mg/dL (H)).   Medical History: Past Medical History:  Diagnosis Date  . Acute bronchitis 04/03/2014  . Anxiety   . Atrial fibrillation ([place])   . Cataract   . COPD (chronic obstructive pulmonary disease) ([place])   . Essential hypertension   . Hyperlipidemia   . Macular degeneration   . Noncompliance with medications 04/2013   Xarelto, digoxin previously  . Pre-diabetes   . Prostate cancer ([place])   . Stage III chronic kidney disease 04/03/2014  . Tubular adenoma of colon 07/31/02, 11/18/03    Medications:  Prescriptions Prior to Admission  Medication Sig Dispense Refill Last Dose  . albuterol (PROVENTIL HFA;VENTOLIN HFA) 108 (90 BASE) MCG/ACT inhaler Inhale 2 puffs into the lungs every 6 (six) hours as needed for wheezing or shortness of breath. 1 Inhaler 2 Past Week at Unknown time  . ALLERGY RELIEF 10 MG tablet TAKE 1 TABLET DAILY FOR ALLERGY 30 tablet 5 Past Week at Unknown time  . atorvastatin (LIPITOR) 20 MG tablet TAKE 1 TABLET DAILY 30 tablet 2 Past Week at Unknown time  . budesonide-formoterol (SYMBICORT) 160-4.5 MCG/ACT inhaler Inhale 2 puffs into the lungs 2 (two) times daily. 1 Inhaler 3 Past Week at Unknown time  . clonazePAM (KLONOPIN) 1 MG tablet TAKE 1/2  TABLET ONCE DAILY AS NEEDED FOR ANXIETY 15 tablet 0 Past Week at Unknown time  . diltiazem (CARDIZEM CD) 300 MG 24 hr capsule TAKE (1) CAPSULE DAILY 30 capsule 1 Past Week at Unknown time  . doxycycline (ADOXA) 100 MG tablet Take 1 tablet (100 mg total) by mouth 2 (two) times daily. 20 tablet 0 Past Week at Unknown time  . doxycycline (VIBRA-TABS) 100 MG tablet Take 1 tablet by mouth 2 (two) times daily.   Past Week at Unknown time  . doxylamine, Sleep, (UNISOM) 25 MG tablet Take 25 mg by mouth at bedtime as needed.   Past Week at Unknown time  . DULoxetine (CYMBALTA) 60 MG capsule TAKE (1) CAPSULE DAILY 30 capsule 1 Past Week at Unknown time  . FERREX 150 150 MG capsule Take 1 capsule by mouth daily.   Past Week at Unknown time  . furosemide (LASIX) 40 MG tablet Take 1 tablet by mouth every other day.    Past Week at Unknown time  . metoprolol tartrate (LOPRESSOR) 25 MG tablet TAKE (1) TABLET TWICE A DAY. 60 tablet 4 Past Week at Unknown time  . Multiple Vitamin (MULTIVITAMIN WITH MINERALS) TABS Take 1 tablet by mouth daily.   Past Week at Unknown time  . nitroGLYCERIN (NITROSTAT) 0.4 MG SL tablet Place 1 tablet (0.4 mg total) under the tongue every 5 (five) minutes as needed for chest pain. 25 tablet 2 Past Week at Unknown time  . omega-3  acid ethyl esters (LOVAZA) 1 G capsule TAKE (2) CAPSULES TWICE DAILY. 120 capsule 2 Past Week at Unknown time  . pantoprazole (PROTONIX) 40 MG tablet TAKE 1 TABLET DAILY 30 tablet 2 Past Week at Unknown time  . Polyethyl Glycol-Propyl Glycol (SYSTANE OP) Place 1 drop into both eyes 2 (two) times daily as needed (dry eyes).    Past Week at Unknown time  . Probiotic Product (PRO-BIOTIC BLEND) CAPS Take 1 capsule by mouth daily.   Past Week at Unknown time  . terazosin (HYTRIN) 2 MG capsule TAKE 1 CAPSULE AT BEDTIME 30 capsule 4 Past Week at Unknown time  . tiotropium (SPIRIVA) 18 MCG inhalation capsule Place 1 capsule (18 mcg total) into inhaler and inhale daily. 30  capsule 3 Past Week at Unknown time  . triamcinolone cream (KENALOG) 0.1 % Apply 1 application topically 2 (two) times daily. 80 g 0 Past Week at Unknown time  . warfarin (COUMADIN) 4 MG tablet Take 1 and 1/2 tablets (= '6mg'$ ) daily except on Sundays and Thursdays take 1 tablet (='4mg'$ ) 45 tablet 0 Past Week at Unknown time  . Incontinence Supply Disposable (INCONTINENCE BRIEF LARGE) MISC Use as needed for urinary incontinence.  Dx: urinary incontinence R32 and prostate cancer C61 200 each 1 Taking   Assessment: 76yo male on chronic Coumadin PTA.  INR remains elevated. Patient will probably only need '4mg'$  daily, or possibly less at discharge.   Goal of Therapy:  INR 2-3   Plan:  No Coumadin today. PT-INR daily Monitor for s/s of bleeding  Pricilla Larsson, [facility] 01/16/2017 11:15 AM

## 2017-01-16 NOTE — Progress Notes (Signed)
PROGRESS NOTE    Don [name]  WCH:[id number] DOB: 07-08-1941 DOA: 01/05/2017 PCP: Wardell Honour, MD   Brief Narrative:  76 year old male with history of COPD and tobacco abuse, came to the emergency room with complaints of shortness of breath. Found to have COPD exacerbation and tested positive for influenza. He also had rapid atrial fibrillation requiring Cardizem infusion. Started on steroids, bronchodilators and antibiotics. Respiratory status has been slow to improve. Rapid atrial fibrillation being driven by respiratory status. Cardiology following. Also has AKI on CKD3 and nephrology is following. Slowly improving respiratory wise and Cr steadily trending down very slowly. Still SOB and states he is having Left sided pain when coughing. Stable to transfer to the Medical Floor with Telemetry 01/15/17. Still complaining of Flank Pain so will order a KUB.  Assessment & Plan:   Active Problems:   Hypertension   Tobacco abuse   Stage III chronic kidney disease   COPD exacerbation (HCC)   Atrial fibrillation with RVR (HCC)   Acute respiratory failure with hypoxia (HCC)   AKI (acute kidney injury) (HCC)   Influenza A   Respiratory distress   SOB (shortness of breath)  1. Acute respiratory failure with hypoxia related to COPD exacerbation and Influenza A. stable -We'll start to wean off oxygen as his clinical condition improves.  -Maintain O2 Saturations >92% -Will likely need 6 minute walk screen prior to D/C when stable.  -Still requiring O2 and significantly rhodochrous and wheezing  2. COPD exacerbation.  -Continue the patient on IV Methylprednisolone 60 mg q6h, D/C'd Abx with Levofloxacin 500 mg po q48h as treatment is complete. C/w bronchodilators with Xopenex 0.63 mg Neb 4 Times Daily and 0.63 mg q2hprn and Ipratropium 0.5 mg Neb 4 times daily.  -Continues to wheeze and is short of breath significantly. Continue pulmonary hygiene. -C/w Dulera 2 puff IH BID and Gauifenesin  1200 po BID -C/w Pantoprazole 40 mg po Daily while on IV Steroids  -C/w Flutter Valve and Incentive Spirometry -C/w Chest Physiotherapy q4h while awake -Ordered Hypertonic Saline Nebs however discussed with Pharmacy and only have 0.9% Nebs so changed to that -C/w Guaifenesin 1,200 mg po BID -Has Left sided flank/abdominal Pain from coughing -Repeat CXR showed mild central pulmonary vascular congestion  3. Atrial fibrillation with RVR. stable -Likely precipitated by underlying respiratory issues.  -He does take Metoprolol at home, which is being held for now due to significant bronchospasms. He also takes diltiazem.  -C/w Long-Acting Diltiazem 180 mg BID by Cardiology -Bisoprolol 5 mg po BID increased to 7.5 mg po BID by Cardiology;  -Blood pressures are low making further titration of medications challenging.  -Digoxin not good option due to renal issues.  -Cardiology following and appreciate Recc's -He is anticoagulated with Coumadin and continue Coumadin per Pharmacy (they will give him 4 mg of Coumadin tonight as PT was 36.6 and INR was 3.58) -TSH normal. Echocardiogram unremarkable, EF 60-65%. -C/w Telemetry  4. AKI on CKD3/4, slightly worse  -BUN/Cr slowly trending up from 81/2.92 -> 83/3.23 -> 96/3.44 -Nephrology stopped patient's IV Lasix and D/C'd 20 mg of po Torsemide Daily along with Potassium Supplementation -Nephrology Dr. Lowanda Foster following and appreciate Recc's.  -Avoid Nephrotoxics -Follow Strict Urine output. Will place Condom Catheter to accurately measure  -Repeat CMP in AM  5. Hypertension.  -Blood pressures currently stable.  -Continue current treatments with Diltiazem 180 mg po Long Acting and Bisoprolol 7.5 mg po BID.    6. Hypokalemia -Resolved -Patient's K+ was 3.2  yesterday and improved to 3.8 today -Repleted with 40 mEQ of KCL po BID yesterday -Repeat CMP in AM  7. Hypomagnesemia -Patient's Mag Level was 1.6 -Replete with IV Mag Sulfate -Repeat  Mag Level in AM  8. Tobacco Use.  -Counseled on importance of tobacco cessation.  9. Influenza A.  -Droplet Precautions while in the Hospital discontinued by Inpatient Nurse Kurtis Bushman -S/p Oseltamivir 30 mg po Daily x 5 days because of AKI  10. Hyperglycemia. -Likely related to IV Steroids. -HbA1c was 6.0.  -CBG's have been running from 129-266 -C/w Moderate Novolog SSI AC Hs; May Increase to Resistant Scale  11. Leukocytosis  -Improved -WBC now 10.7 -Repeat CBC in AM  12. Hyponatremia, improved -Improved to 138 -Likely from Hypovolemia -Continue to Trend CMP's  13. Constipation, improved -Scheduled Miralax 17 grams po BID -Started Senna-Doucsate 1 tab po BID -Gave Bisacodyl Suppository x1 dose  14. Left Intermittent Rib Pain/Flank  -Worse with Coughing -Unfortunately cannot give NSAIDs because of kidney fxn -Will Obtain Abdominal Flat Plate to evaluate and CT Scan if not improving within the Next few days  DVT prophylaxis: Anticoagulated with Coumadin Code Status: Partial Code, no CPR, No defibrilliation Family Communication: No family present at bedside Disposition Plan: Likely Home at D/C; Stable to transfer to the Medical Floor with Telemetry  Consultants:   Cardiology  Nephrology  Procedures:   ECHOCARDIOGRAM 01/05/17 Study Conclusions  - Left ventricle: The cavity size was normal. Systolic function was   normal. The estimated ejection fraction was in the range of 60%   to 65%. Wall motion was normal; there were no regional wall   motion abnormalities. The study was not technically sufficient to   allow evaluation of LV diastolic dysfunction due to atrial   fibrillation. Mild concentric and moderate focal basal septal   hypertrophy. - Aortic valve: Trileaflet; mildly calcified leaflets. There was no   stenosis. - Mitral valve: Moderately calcified annulus. - Left atrium: The atrium was mildly dilated.   Antimicrobials:  Anti-infectives      Start     Dose/Rate Route Frequency Ordered Stop   01/09/17 1400  levofloxacin (LEVAQUIN) tablet 500 mg  Status:  Discontinued     500 mg Oral Every 48 hours 01/08/17 1345 01/11/17 1807   01/05/17 2200  oseltamivir (TAMIFLU) capsule 75 mg  Status:  Discontinued     75 mg Oral 2 times daily 01/05/17 1836 01/05/17 1848   01/05/17 2000  oseltamivir (TAMIFLU) capsule 30 mg     30 mg Oral Daily 01/05/17 1848 01/09/17 1954   01/05/17 1400  levofloxacin (LEVAQUIN) tablet 750 mg  Status:  Discontinued     750 mg Oral Every 48 hours 01/05/17 1333 01/08/17 1345     Subjective: States he was feeling better and breathing seemed the same. Stated that if he holds his side the pain ceases. No nausea or vomiting and looks better. No other complaints or concerns.  Objective: Vitals:   01/16/17 0744 01/16/17 0754 01/16/17 1106 01/16/17 1340  BP:    128/74  Pulse:    94  Resp:    18  Temp:    98.6 F (37 C)  TempSrc:    Oral  SpO2: 92% 92% 96% 97%  Weight:      Height:        Intake/Output Summary (Last 24 hours) at 01/16/17 1419 Last data filed at 01/16/17 1037  Gross per 24 hour  Intake  603 ml  Output             1700 ml  Net            -1097 ml   Filed Weights   01/13/17 0548 01/14/17 0500 01/15/17 0500  Weight: 84.4 kg (186 lb 1.1 oz) 82.9 kg (182 lb 12.2 oz) 79.9 kg (176 lb 2.4 oz)   Examination: Physical Exam:  Constitutional: Appears calm and comfortable and in No distress Eyes: Sclerae anicteric  ENMT: External Ears, Nose appear normal. Grossly normal hearing.  Neck: Appears normal, supple, no cervical masses, normal ROM, no appreciable thyromegaly or JVD Respiratory: Diminished to auscultation bilaterally with improved wheezing and slightly rhonchus breath sounds. Normal respiratory effort. No accessory muscle use. Wearing Supplemental O2 via [place].  Cardiovascular: Irregularly Irregular, no murmurs / rubs / gallops. S1 and S2 auscultated. Mild extremity edema.   Abdomen: Soft, non-tender, non-distended. No masses palpated. No appreciable hepatosplenomegaly. Bowel sounds positive x4.  GU: Deferred. Musculoskeletal: No clubbing / cyanosis of digits/nails. No joint deformity upper and lower extremities.  Skin: No rashes, lesions, ulcers on limited skin eval. No induration; Warm and dry. Upper arm erythema and slight bruising noted.  Neurologic: CN 2-12 grossly intact with no focal deficits. Romberg sign cerebellar reflexes not assessed.  Psychiatric: Normal judgment and insight. Alert and oriented x 3. Normal mood and appropriate affect.   Data Reviewed: I have personally reviewed following labs and imaging studies  CBC:  Recent Labs Lab 01/12/17 0422 01/13/17 0540 01/14/17 0434 01/15/17 0429 01/16/17 0521  WBC 8.0 8.6 10.1 9.8 10.7*  NEUTROABS 7.4 7.9* 9.4* 9.1* 10.2*  HGB 12.1* 12.4* 12.7* 13.2 13.4  HCT 33.5* 34.8* 35.0* 36.1* 37.4*  MCV 94.6 94.8 94.3 93.5 93.5  PLT 173 201 200 200 678   Basic Metabolic Panel:  Recent Labs Lab 01/11/17 0441 01/12/17 0422 01/13/17 0540 01/14/17 0434 01/15/17 0429 01/16/17 0521  NA 133* 136 137  138 139 138 138  K 3.9 4.0 3.6  3.6 3.3* 3.2* 3.8  CL 101 106 108  108 106 99* 98*  CO2 21* 20* 19*  20* '24 27 27  '$ GLUCOSE 211* 174* 208*  210* 179* 246* 216*  BUN 86* 84* 79*  78* 81* 83* 96*  CREATININE 3.80* 3.35* 3.09*  3.02* 2.92* 3.23* 3.44*  CALCIUM 7.8* 7.9* 8.1*  8.1* 8.3* 8.3* 8.5*  MG 1.3*  --  1.3* 1.4* 1.8 1.6*  PHOS 5.1* 4.1 4.3  4.3 4.4 4.8* 4.7*   GFR: Estimated Creatinine Clearance: 18 mL/min (by C-G formula based on SCr of 3.44 mg/dL (H)). Liver Function Tests:  Recent Labs Lab 01/11/17 0441 01/12/17 0422 01/13/17 0540 01/14/17 0434 01/15/17 0429 01/16/17 0521  AST 41  --  '30 27 26 27  '$ ALT 33  --  '30 29 28 29  '$ ALKPHOS 48  --  48 51 53 54  BILITOT 0.7  --  0.9 0.8 0.9 0.8  PROT 4.8*  --  4.7* 4.8* 4.7* 4.8*  ALBUMIN 2.3* 2.2* 2.3*  2.2* 2.4* 2.3* 2.4*   No  results for input(s): LIPASE, AMYLASE in the last 168 hours. No results for input(s): AMMONIA in the last 168 hours. Coagulation Profile:  Recent Labs Lab 01/12/17 0422 01/13/17 0540 01/14/17 0434 01/15/17 0429 01/16/17 0521  INR 2.17 2.20 2.57 3.53 3.58   Cardiac Enzymes: No results for input(s): CKTOTAL, CKMB, CKMBINDEX, TROPONINI in the last 168 hours. BNP (last 3 results) No results for input(s): PROBNP in  the last 8760 hours. HbA1C: No results for input(s): HGBA1C in the last 72 hours. CBG:  Recent Labs Lab 01/15/17 1141 01/15/17 1614 01/15/17 2141 01/16/17 0734 01/16/17 1145  GLUCAP 265* 129* 222* 244* 332*   Lipid Profile: No results for input(s): CHOL, HDL, LDLCALC, TRIG, CHOLHDL, LDLDIRECT in the last 72 hours. Thyroid Function Tests: No results for input(s): TSH, T4TOTAL, FREET4, T3FREE, THYROIDAB in the last 72 hours. Anemia Panel: No results for input(s): VITAMINB12, FOLATE, FERRITIN, TIBC, IRON, RETICCTPCT in the last 72 hours. Sepsis Labs: No results for input(s): PROCALCITON, LATICACIDVEN in the last 168 hours.  No results found for this or any previous visit (from the past 240 hour(s)).  Radiology Studies: Dg Chest Port 1 View  Result Date: 01/15/2017 CLINICAL DATA:  Shortness of breath. EXAM: PORTABLE CHEST 1 VIEW COMPARISON:  Radiographs January 14, 2017. FINDINGS: The heart size and mediastinal contours are within normal limits. No pneumothorax or pleural effusion is noted. Mild central pulmonary vascular congestion is noted. The visualized skeletal structures are unremarkable. IMPRESSION: Mild central pulmonary vascular congestion. Electronically Signed   By: Marijo Conception, M.D.   On: 01/15/2017 07:42    Scheduled Meds: . atorvastatin  20 mg Oral q1800  . bisacodyl  10 mg Rectal Once  . bisoprolol  7.5 mg Oral BID  . diltiazem  180 mg Oral BID  . DULoxetine  60 mg Oral Daily  . guaiFENesin  1,200 mg Oral BID  . insulin aspart  0-15 Units  Subcutaneous TID WC  . insulin aspart  0-5 Units Subcutaneous QHS  . ipratropium  0.5 mg Nebulization QID  . iron polysaccharides  150 mg Oral Daily  . levalbuterol  0.63 mg Nebulization QID  . mouth rinse  15 mL Mouth Rinse BID  . methylPREDNISolone (SOLU-MEDROL) injection  60 mg Intravenous Q6H  . mometasone-formoterol  2 puff Inhalation BID  . omega-3 acid ethyl esters  2 g Oral BID  . pantoprazole  40 mg Oral Daily  . polyethylene glycol  17 g Oral BID  . senna-docusate  1 tablet Oral BID  . sodium chloride  4 mL Nebulization Daily  . sodium chloride flush  3 mL Intravenous Q12H  . terazosin  2 mg Oral QHS  . Warfarin - Pharmacist Dosing Inpatient   Does not apply Q24H   Continuous Infusions:   LOS: 11 days   Kerney Elbe, DO [facility] Pager [phone]  If 7PM-7AM, please contact night-coverage [email or web address] Password [email or web address] 01/16/2017, 2:19 PM

## 2017-01-17 ENCOUNTER — Inpatient Hospital Stay (HOSPITAL_COMMUNITY): Payer: Medicare HMO

## 2017-01-17 DIAGNOSIS — N179 Acute kidney failure, unspecified: Secondary | ICD-10-CM

## 2017-01-17 DIAGNOSIS — R0602 Shortness of breath: Secondary | ICD-10-CM

## 2017-01-17 DIAGNOSIS — I10 Essential (primary) hypertension: Secondary | ICD-10-CM

## 2017-01-17 DIAGNOSIS — R0600 Dyspnea, unspecified: Secondary | ICD-10-CM

## 2017-01-17 DIAGNOSIS — J9601 Acute respiratory failure with hypoxia: Secondary | ICD-10-CM

## 2017-01-17 DIAGNOSIS — Z720 Tobacco use: Secondary | ICD-10-CM

## 2017-01-17 LAB — COMPREHENSIVE METABOLIC PANEL
ALBUMIN: 2.2 g/dL — AB (ref 3.5–5.0)
ALK PHOS: 55 U/L (ref 38–126)
ALT: 29 U/L (ref 17–63)
ANION GAP: 9 (ref 5–15)
AST: 29 U/L (ref 15–41)
BUN: 102 mg/dL — AB (ref 6–20)
CO2: 29 mmol/L (ref 22–32)
Calcium: 8 mg/dL — ABNORMAL LOW (ref 8.9–10.3)
Chloride: 95 mmol/L — ABNORMAL LOW (ref 101–111)
Creatinine, Ser: 3.2 mg/dL — ABNORMAL HIGH (ref 0.61–1.24)
GFR calc Af Amer: 20 mL/min — ABNORMAL LOW (ref >60)
GFR calc non Af Amer: 17 mL/min — ABNORMAL LOW (ref >60)
GLUCOSE: 243 mg/dL — AB (ref 65–99)
Potassium: 3.7 mmol/L (ref 3.5–5.1)
SODIUM: 133 mmol/L — AB (ref 135–145)
Total Bilirubin: 1.1 mg/dL (ref 0.3–1.2)
Total Protein: 4.5 g/dL — ABNORMAL LOW (ref 6.5–8.1)

## 2017-01-17 LAB — CBC WITH DIFFERENTIAL/PLATELET
BASOS ABS: 0 10*3/uL (ref 0.0–0.1)
BASOS PCT: 0 %
Eosinophils Absolute: 0 10*3/uL (ref 0.0–0.7)
Eosinophils Relative: 0 %
HCT: 32 % — ABNORMAL LOW (ref 39.0–52.0)
HEMOGLOBIN: 11.6 g/dL — AB (ref 13.0–17.0)
Lymphocytes Relative: 2 %
Lymphs Abs: 0.3 10*3/uL — ABNORMAL LOW (ref 0.7–4.0)
MCH: 33.6 pg (ref 26.0–34.0)
MCHC: 36.3 g/dL — AB (ref 30.0–36.0)
MCV: 92.8 fL (ref 78.0–100.0)
MONOS PCT: 2 %
Monocytes Absolute: 0.3 10*3/uL (ref 0.1–1.0)
NEUTROS ABS: 11.7 10*3/uL — AB (ref 1.7–7.7)
NEUTROS PCT: 96 %
Platelets: 170 10*3/uL (ref 150–400)
RBC: 3.45 MIL/uL — ABNORMAL LOW (ref 4.22–5.81)
RDW: 12.7 % (ref 11.5–15.5)
WBC: 12.3 10*3/uL — ABNORMAL HIGH (ref 4.0–10.5)

## 2017-01-17 LAB — GLUCOSE, CAPILLARY
GLUCOSE-CAPILLARY: 241 mg/dL — AB (ref 65–99)
GLUCOSE-CAPILLARY: 291 mg/dL — AB (ref 65–99)
Glucose-Capillary: 262 mg/dL — ABNORMAL HIGH (ref 65–99)
Glucose-Capillary: 232 mg/dL — ABNORMAL HIGH (ref 65–99)

## 2017-01-17 LAB — PROTIME-INR
INR: 3.4
PROTHROMBIN TIME: 35.1 s — AB (ref 11.4–15.2)

## 2017-01-17 LAB — PHOSPHORUS: Phosphorus: 4.5 mg/dL (ref 2.5–4.6)

## 2017-01-17 LAB — MAGNESIUM: Magnesium: 1.9 mg/dL (ref 1.7–2.4)

## 2017-01-17 MED ORDER — METHYLPREDNISOLONE SODIUM SUCC 125 MG IJ SOLR
60.0000 mg | Freq: Two times a day (BID) | INTRAMUSCULAR | Status: DC
  Administered 2017-01-17 – 2017-01-18 (×2): 60 mg via INTRAVENOUS
  Filled 2017-01-17 (×2): qty 2

## 2017-01-17 NOTE — Progress Notes (Signed)
[place] for Coumadin (chronic Rx PTA) Indication: atrial fibrillation  Allergies  Allergen Reactions  . Wellbutrin [Bupropion] Anxiety    Patient Measurements: Height: '5\' 9"'$  (175.3 cm) Weight: 176 lb 2.4 oz (79.9 kg) IBW/kg (Calculated) : 70.7  Vital Signs: Temp: 97.8 F (36.6 C) (03/07 0517) Temp Source: Oral (03/07 0517) BP: 110/79 (03/07 3536) Pulse Rate: 70 (03/07 0517)  Labs:  Recent Labs  01/15/17 0429 01/16/17 0521 01/17/17 0555  HGB 13.2 13.4 11.6*  HCT 36.1* 37.4* 32.0*  PLT 200 185 170  LABPROT 36.2* 36.6* 35.1*  INR 3.53 3.58 3.40  CREATININE 3.23* 3.44* 3.20*    Estimated Creatinine Clearance: 19.3 mL/min (by C-G formula based on SCr of 3.2 mg/dL (H)).   Medical History: Past Medical History:  Diagnosis Date  . Acute bronchitis 04/03/2014  . Anxiety   . Atrial fibrillation ([place])   . Cataract   . COPD (chronic obstructive pulmonary disease) ([place])   . Essential hypertension   . Hyperlipidemia   . Macular degeneration   . Noncompliance with medications 04/2013   Xarelto, digoxin previously  . Pre-diabetes   . Prostate cancer ([place])   . Stage III chronic kidney disease 04/03/2014  . Tubular adenoma of colon 07/31/02, 11/18/03    Medications:  Prescriptions Prior to Admission  Medication Sig Dispense Refill Last Dose  . albuterol (PROVENTIL HFA;VENTOLIN HFA) 108 (90 BASE) MCG/ACT inhaler Inhale 2 puffs into the lungs every 6 (six) hours as needed for wheezing or shortness of breath. 1 Inhaler 2 Past Week at Unknown time  . ALLERGY RELIEF 10 MG tablet TAKE 1 TABLET DAILY FOR ALLERGY 30 tablet 5 Past Week at Unknown time  . atorvastatin (LIPITOR) 20 MG tablet TAKE 1 TABLET DAILY 30 tablet 2 Past Week at Unknown time  . budesonide-formoterol (SYMBICORT) 160-4.5 MCG/ACT inhaler Inhale 2 puffs into the lungs 2 (two) times daily. 1 Inhaler 3 Past Week at Unknown time  . clonazePAM (KLONOPIN) 1 MG tablet TAKE 1/2  TABLET ONCE DAILY AS NEEDED FOR ANXIETY 15 tablet 0 Past Week at Unknown time  . diltiazem (CARDIZEM CD) 300 MG 24 hr capsule TAKE (1) CAPSULE DAILY 30 capsule 1 Past Week at Unknown time  . doxycycline (ADOXA) 100 MG tablet Take 1 tablet (100 mg total) by mouth 2 (two) times daily. 20 tablet 0 Past Week at Unknown time  . doxycycline (VIBRA-TABS) 100 MG tablet Take 1 tablet by mouth 2 (two) times daily.   Past Week at Unknown time  . doxylamine, Sleep, (UNISOM) 25 MG tablet Take 25 mg by mouth at bedtime as needed.   Past Week at Unknown time  . DULoxetine (CYMBALTA) 60 MG capsule TAKE (1) CAPSULE DAILY 30 capsule 1 Past Week at Unknown time  . FERREX 150 150 MG capsule Take 1 capsule by mouth daily.   Past Week at Unknown time  . furosemide (LASIX) 40 MG tablet Take 1 tablet by mouth every other day.    Past Week at Unknown time  . metoprolol tartrate (LOPRESSOR) 25 MG tablet TAKE (1) TABLET TWICE A DAY. 60 tablet 4 Past Week at Unknown time  . Multiple Vitamin (MULTIVITAMIN WITH MINERALS) TABS Take 1 tablet by mouth daily.   Past Week at Unknown time  . nitroGLYCERIN (NITROSTAT) 0.4 MG SL tablet Place 1 tablet (0.4 mg total) under the tongue every 5 (five) minutes as needed for chest pain. 25 tablet 2 Past Week at Unknown time  . omega-3  acid ethyl esters (LOVAZA) 1 G capsule TAKE (2) CAPSULES TWICE DAILY. 120 capsule 2 Past Week at Unknown time  . pantoprazole (PROTONIX) 40 MG tablet TAKE 1 TABLET DAILY 30 tablet 2 Past Week at Unknown time  . Polyethyl Glycol-Propyl Glycol (SYSTANE OP) Place 1 drop into both eyes 2 (two) times daily as needed (dry eyes).    Past Week at Unknown time  . Probiotic Product (PRO-BIOTIC BLEND) CAPS Take 1 capsule by mouth daily.   Past Week at Unknown time  . terazosin (HYTRIN) 2 MG capsule TAKE 1 CAPSULE AT BEDTIME 30 capsule 4 Past Week at Unknown time  . tiotropium (SPIRIVA) 18 MCG inhalation capsule Place 1 capsule (18 mcg total) into inhaler and inhale daily. 30  capsule 3 Past Week at Unknown time  . triamcinolone cream (KENALOG) 0.1 % Apply 1 application topically 2 (two) times daily. 80 g 0 Past Week at Unknown time  . warfarin (COUMADIN) 4 MG tablet Take 1 and 1/2 tablets (= '6mg'$ ) daily except on Sundays and Thursdays take 1 tablet (='4mg'$ ) 45 tablet 0 Past Week at Unknown time  . Incontinence Supply Disposable (INCONTINENCE BRIEF LARGE) MISC Use as needed for urinary incontinence.  Dx: urinary incontinence R32 and prostate cancer C61 200 each 1 Taking   Assessment: 76yo male on chronic Coumadin PTA.  INR remains elevated. Patient will probably only need '4mg'$  daily, or possibly less at discharge.   Goal of Therapy:  INR 2-3   Plan:  No Coumadin today. PT-INR daily Monitor for s/s of bleeding  Ena Dawley, [facility] 01/17/2017 11:14 AM

## 2017-01-17 NOTE — Progress Notes (Signed)
Telemetry reviewed, afib rates 90s-120s. Currently low 100s. Yesterday we increased bisoprolol to 7.'5mg'$  bid, he remains on dilt '180mg'$  bid. Degree of tachycardia is expected given his respiratory issues, rates <110 would be reasonable target. Follow rates today after recent bisoprolol increase    Zandra Abts MD

## 2017-01-17 NOTE — Progress Notes (Signed)
Physical Therapy Treatment Patient Details Name: Don [name] MRN: [id number] DOB: 1941/08/28 Today's Date: 01/17/2017    History of Present Illness 76 year old male with history of COPD and tobacco abuse, came to the emergency room with complaints of shortness of breath. Found to have COPD exacerbation and tested positive for influenza. He also had rapid atrial fibrillation requiring Cardizem infusion. Started on steroids, bronchodilators and antibiotics. Respiratory status has been slow to improve. Rapid atrial fibrillation being driven by respiratory status. Cardiology following. Also has AKI on CKD3 and nephrology is following. Slowly improving respiratory wise and Cr steadily trending down very slowly. Still SOB and states he is having Left sided pain when coughing. Stable to transfer to the Medical Floor with Telemetry today.  Dx: Acute respiratory failure with hypoxia related to COPD exacerbation and Influenza A    PT Comments    Pt received sitting up in the chair, and was agreeable to PT tx.  Pt expressed some discomfort / pain in his groin area, as well as B ankles.  Pt was able to increase gait distance today, and ambulated 130f with RW at modified independent level.  He demonstrates decreased gait speed at 0.729fsec which is indicative of risk for recurrent falls.  However, he did not demonstrate any a LOB during gait.  Continue to recommend HHPT with supervision/assistance vs ALF at this time.   Follow Up Recommendations  Home health PT;Supervision/Assistance - 24 hour;Other (comment) (vs ALF)     Equipment Recommendations  None recommended by PT    Recommendations for Other Services       Precautions / Restrictions Precautions Precautions: Fall Precaution Comments: Due to immobility Restrictions Weight Bearing Restrictions: No    Mobility  Bed Mobility Overal bed mobility: Modified Independent                Transfers Overall transfer level: Modified  independent Equipment used: Rolling walker (2 wheeled) Transfers: Sit to/from Stand Sit to Stand: Modified independent (Device/Increase time)            Ambulation/Gait Ambulation/Gait assistance: Modified independent (Device/Increase time) Ambulation Distance (Feet): 160 Feet Assistive device: Rolling walker (2 wheeled) Gait Pattern/deviations: Step-through pattern;Trunk flexed Gait velocity: 0.77 ft/sec Gait velocity interpretation: <1.8 ft/sec, indicative of risk for recurrent falls General Gait Details: Pt is very talkative, and requires 2-3 standing rest breaks due to poor ability to dual task with walking and talking at the same time.     Stairs            Wheelchair Mobility    Modified Rankin (Stroke Patients Only)       Balance Overall balance assessment: Needs assistance Sitting-balance support: Bilateral upper extremity supported;Feet supported Sitting balance-Leahy Scale: Good     Standing balance support: Bilateral upper extremity supported Standing balance-Leahy Scale: Fair                      Cognition Arousal/Alertness: Awake/alert Behavior During Therapy: WFL for tasks assessed/performed Overall Cognitive Status: Within Functional Limits for tasks assessed                      Exercises      General Comments        Pertinent Vitals/Pain Pain Assessment: 0-10 Pain Location: Pt mentions discomfort in his groin area, as well as B ankles. Pt does not rate pain.  Pain Descriptors / Indicators: Aching Pain Intervention(s): Limited activity within patient's tolerance;Monitored during session;Repositioned  Home Living                      Prior Function            PT Goals (current goals can now be found in the care plan section) Acute Rehab PT Goals Patient Stated Goal: Pt wants to get stronger PT Goal Formulation: With patient Time For Goal Achievement: 01/22/17 Potential to Achieve Goals: Good Progress  towards PT goals: Progressing toward goals    Frequency    Min 3X/week      PT Plan Current plan remains appropriate    Co-evaluation             End of Session Equipment Utilized During Treatment: Gait belt;Oxygen Activity Tolerance: Patient limited by fatigue Patient left: in chair;with call bell/phone within reach Nurse Communication: Mobility status PT Visit Diagnosis: Other abnormalities of gait and mobility (R26.89);Muscle weakness (generalized) (M62.81)     Time: 1791-5056 PT Time Calculation (min) (ACUTE ONLY): 27 min  Charges:  $Gait Training: 8-22 mins $Therapeutic Activity: 8-22 mins                    G Codes:       Beth Nalayah Hitt, PT, DPT X: [phone]

## 2017-01-17 NOTE — Progress Notes (Signed)
PROGRESS NOTE    Don [name]  PYP:[id number] DOB: 1941-07-10 DOA: 01/05/2017 PCP: Wardell Honour, MD    Brief Narrative:  76 year old male with history of COPD and tobacco abuse, came to the emergency room with complaints of shortness of breath. Found to have COPD exacerbation and tested positive for influenza. He also had rapid atrial fibrillation requiring Cardizem infusion. Started on steroids, bronchodilators and antibiotics. Respiratory status has been slow to improve. Rapid atrial fibrillation being driven by respiratory status. Cardiology following. Also has AKI on CKD3 and nephrology is following. Slowly improving respiratory wise and Cr steadily trending down very slowly. Still SOB and states he is having Left sided pain when coughing. Stable to transfer to the Medical Floor with Telemetry 01/15/17. Still complaining of Flank Pain so will order a KUB.   Assessment & Plan:   Active Problems:   Hypertension   Tobacco abuse   Stage III chronic kidney disease   COPD exacerbation (HCC)   Atrial fibrillation with RVR (HCC)   Acute respiratory failure with hypoxia (HCC)   AKI (acute kidney injury) (HCC)   Influenza A   Respiratory distress   SOB (shortness of breath)   Acute respiratory failure with hypoxia related to COPD exacerbation and Influenza A. stable -We'll start to wean off oxygen as his clinical condition improves.  -Maintain O2 Saturations >92% -Will likely need 6 minute walk screen prior to D/C when stable.  -Still requiring O2 and significantly rhodochrous and wheezing  COPD exacerbation.  -Continue the patient on IV Methylprednisolone 60 mg q12h (changed from q6h) - D/C'd Abx with Levofloxacin 500 mg po q48h as treatment is complete - C/w bronchodilators with Xopenex 0.63 mg Neb 4 Times Daily and 0.63 mg q2hprn and Ipratropium 0.5 mg Neb 4 times daily.  -C/w Dulera 2 puff IH BID and Gauifenesin 1200 po BID -C/w Pantoprazole 40 mg po Daily while on IV  Steroids  -C/w Flutter Valve and Incentive Spirometry -C/w Chest Physiotherapy q4h while awake -Ordered Hypertonic Saline Nebs however discussed with Pharmacy and only have 0.9% Nebs so changed to that -C/w Guaifenesin 1,200 mg po BID -Has Left sided flank/abdominal Pain from coughing -Repeat CXR showed mild central pulmonary vascular congestion  Atrial fibrillation with RVR. stable -Likely precipitated by underlying respiratory issues.  -He does take Metoprolol at home, which is being held for now due to significant bronchospasms. He also takes diltiazem.  -C/w Long-Acting Diltiazem 180 mg BID by Cardiology -Bisoprolol 5 mg po BID increased to 7.5 mg po BID by Cardiology;  -Digoxin not good option due to renal issues.  -Cardiology following and appreciate Recc's -He is anticoagulated with Coumadin and continue Coumadin per Pharmacy -TSH normal. Echocardiogram unremarkable, EF 60-65%. -C/w Telemetry  AKI on CKD3/4, slightly worse  -BUN/Cr slowly trending up from 81/2.92 -> 83/3.23 -> 96/3.44 -Nephrology stopped patient's IV Lasix and D/C'd 20 mg of po Torsemide Daily along with Potassium Supplementation -Nephrology following and appreciate Recc's.  -Avoid Nephrotoxics -Follow Strict Urine output. Will place Condom Catheter to accurately measure - daily BMP  Hypertension.  -Blood pressures currently stable.  -Continue current treatments with Diltiazem 180 mg po Long Acting and Bisoprolol 7.5 mg po BID.    Hypokalemia -Resolved  Hypomagnesemia -Patient's Mag Level today of 1.9  Tobacco Use.  -Counseled on importance of tobacco cessation.  Influenza A.  -Droplet Precautions while in the Hospital discontinued by Inpatient Nurse Kurtis Bushman -S/p Oseltamivir 30 mg po Daily x 5 days because of AKI  Hyperglycemia. -Likely related to IV Steroids. -HbA1c was 6.0.  -CBG's have been running from 129-266 -C/w Moderate Novolog SSI AC Hs; May Increase to Resistant  Scale  Leukocytosis  -Improved -WBC now 10.7 -Repeat CBC in AM  Hyponatremia, improved -133 this am -Likely from Hypovolemia -Continue to Trend CMP's  1Constipation - BM this am -Scheduled Miralax 17 grams po BID -Started Senna-Doucsate 1 tab po BID -Gave Bisacodyl Suppository x1 dose  Left Intermittent Rib Pain/Flank  -Worse with Coughing -Unfortunately cannot give NSAIDs because of kidney fxn -Will Obtain Abdominal Flat Plate to evaluate and CT Scan if not improving within the Next few days  DVT prophylaxis: Anticoagulated with Coumadin Code Status: Partial Code, no CPR, No defibrilliation Family Communication: No family present at bedside Disposition Plan: placement pending- PT recommending 24/7 supervision, possibly ALF. SW consulted  Consultants:   Nephrology  Cardiology  PT  CM  SW  Diabetes Coordinator  Procedures:   ECHOCARDIOGRAM 01/05/17- Left ventricle: The cavity size was normal. Systolic function wasnormal. The estimated ejection fraction was in the range of 60%to 65%. Wall motion was normal; there were no regional wallmotion abnormalities. The study was not technically sufficient toallow evaluation of LV diastolic dysfunction due to atrialfibrillation. Mild concentric and moderate focal basal septalhypertrophy. Aortic valve: Trileaflet; mildly calcified leaflets. There was nostenosis. Mitral valve: Moderately calcified annulus. Left atrium: The atrium was mildly dilated  Antimicrobials:   Levaquin 2/23; 2/26>3/1  Tamiflu 2/23>2/27    Subjective: Patient seen and evaluated.  He reports significant breathlessness and weakness ambulating to and from the bathroom.  Mentions his breathing feels similar to what it felt like yesterday.  Mentions occasional lower abdominal pain but states it comes and goes, is not intense, is not caused by anything particular, no relieved by anything.  Had BM this am, no nausea or vomiting.  Objective: Vitals:    01/17/17 0805 01/17/17 0938 01/17/17 1132 01/17/17 1400  BP:  110/79  112/62  Pulse:    86  Resp:    18  Temp:    97.6 F (36.4 C)  TempSrc:    Oral  SpO2: 90%  90% 98%  Weight:      Height:        Intake/Output Summary (Last 24 hours) at 01/17/17 1626 Last data filed at 01/17/17 1200  Gross per 24 hour  Intake              723 ml  Output                0 ml  Net              723 ml   Filed Weights   01/13/17 0548 01/14/17 0500 01/15/17 0500  Weight: 84.4 kg (186 lb 1.1 oz) 82.9 kg (182 lb 12.2 oz) 79.9 kg (176 lb 2.4 oz)    Examination:  General exam: Appears calm and comfortable  Respiratory system: poor air flow, expiratory wheezes, no accessory muscle use Cardiovascular system: S1 & S2 heard, RRR. No JVD, murmurs, rubs, gallops or clicks. No pedal edema. Gastrointestinal system: Abdomen is nondistended, soft and nontender. No organomegaly or masses felt. Normal bowel sounds heard. Central nervous system: Alert and oriented. No focal neurological deficits. Extremities: Symmetric 5 x 5 power. Skin: No rashes, lesions or ulcers Psychiatry: Mood & affect appropriate.     Data Reviewed: I have personally reviewed following labs and imaging studies  CBC:  Recent Labs Lab 01/13/17 0540 01/14/17 0434 01/15/17 0429 01/16/17  8299 01/17/17 0555  WBC 8.6 10.1 9.8 10.7* 12.3*  NEUTROABS 7.9* 9.4* 9.1* 10.2* 11.7*  HGB 12.4* 12.7* 13.2 13.4 11.6*  HCT 34.8* 35.0* 36.1* 37.4* 32.0*  MCV 94.8 94.3 93.5 93.5 92.8  PLT 201 200 200 185 371   Basic Metabolic Panel:  Recent Labs Lab 01/13/17 0540 01/14/17 0434 01/15/17 0429 01/16/17 0521 01/17/17 0555  NA 137  138 139 138 138 133*  K 3.6  3.6 3.3* 3.2* 3.8 3.7  CL 108  108 106 99* 98* 95*  CO2 19*  20* '24 27 27 29  '$ GLUCOSE 208*  210* 179* 246* 216* 243*  BUN 79*  78* 81* 83* 96* 102*  CREATININE 3.09*  3.02* 2.92* 3.23* 3.44* 3.20*  CALCIUM 8.1*  8.1* 8.3* 8.3* 8.5* 8.0*  MG 1.3* 1.4* 1.8 1.6* 1.9   PHOS 4.3  4.3 4.4 4.8* 4.7* 4.5   GFR: Estimated Creatinine Clearance: 19.3 mL/min (by C-G formula based on SCr of 3.2 mg/dL (H)). Liver Function Tests:  Recent Labs Lab 01/13/17 0540 01/14/17 0434 01/15/17 0429 01/16/17 0521 01/17/17 0555  AST '30 27 26 27 29  '$ ALT '30 29 28 29 29  '$ ALKPHOS 48 51 53 54 55  BILITOT 0.9 0.8 0.9 0.8 1.1  PROT 4.7* 4.8* 4.7* 4.8* 4.5*  ALBUMIN 2.3*  2.2* 2.4* 2.3* 2.4* 2.2*   No results for input(s): LIPASE, AMYLASE in the last 168 hours. No results for input(s): AMMONIA in the last 168 hours. Coagulation Profile:  Recent Labs Lab 01/13/17 0540 01/14/17 0434 01/15/17 0429 01/16/17 0521 01/17/17 0555  INR 2.20 2.57 3.53 3.58 3.40   Cardiac Enzymes: No results for input(s): CKTOTAL, CKMB, CKMBINDEX, TROPONINI in the last 168 hours. BNP (last 3 results) No results for input(s): PROBNP in the last 8760 hours. HbA1C: No results for input(s): HGBA1C in the last 72 hours. CBG:  Recent Labs Lab 01/16/17 1627 01/16/17 2135 01/17/17 0741 01/17/17 1113 01/17/17 1605  GLUCAP 241* 218* 241* 291* 262*   Lipid Profile: No results for input(s): CHOL, HDL, LDLCALC, TRIG, CHOLHDL, LDLDIRECT in the last 72 hours. Thyroid Function Tests: No results for input(s): TSH, T4TOTAL, FREET4, T3FREE, THYROIDAB in the last 72 hours. Anemia Panel: No results for input(s): VITAMINB12, FOLATE, FERRITIN, TIBC, IRON, RETICCTPCT in the last 72 hours. Sepsis Labs: No results for input(s): PROCALCITON, LATICACIDVEN in the last 168 hours.  No results found for this or any previous visit (from the past 240 hour(s)).       Radiology Studies: No results found.      Scheduled Meds: . atorvastatin  20 mg Oral q1800  . bisacodyl  10 mg Rectal Once  . bisoprolol  7.5 mg Oral BID  . diltiazem  180 mg Oral BID  . DULoxetine  60 mg Oral Daily  . guaiFENesin  1,200 mg Oral BID  . insulin aspart  0-15 Units Subcutaneous TID WC  . insulin aspart  0-5 Units  Subcutaneous QHS  . ipratropium  0.5 mg Nebulization QID  . iron polysaccharides  150 mg Oral Daily  . levalbuterol  0.63 mg Nebulization QID  . mouth rinse  15 mL Mouth Rinse BID  . methylPREDNISolone (SOLU-MEDROL) injection  60 mg Intravenous Q6H  . mometasone-formoterol  2 puff Inhalation BID  . omega-3 acid ethyl esters  2 g Oral BID  . pantoprazole  40 mg Oral Daily  . polyethylene glycol  17 g Oral BID  . senna-docusate  1 tablet Oral BID  . sodium  chloride flush  3 mL Intravenous Q12H  . terazosin  2 mg Oral QHS  . Warfarin - Pharmacist Dosing Inpatient   Does not apply Q24H   Continuous Infusions:   LOS: 12 days    Time spent: 30 minutes    Loretha Stapler, MD [facility] Pager [phone]  If 7PM-7AM, please contact night-coverage [email or web address] Password [facility] 01/17/2017, 4:26 PM

## 2017-01-17 NOTE — Progress Notes (Signed)
Don [name]  MRN: [id number]  DOB/AGE: 1941-05-14 76 y.o.  Primary Care Physician:MILLER, Lillette Boxer, MD  Admit date: 01/05/2017  Chief Complaint:  Chief Complaint  Patient presents with  . Shortness of Breath    S-Pt presented on  01/05/2017 with  Chief Complaint  Patient presents with  . Shortness of Breath  .    Pt says " My legs feel like noodles, I do not have much strength, I am getting my therapy though"      Meds . atorvastatin  20 mg Oral q1800  . bisacodyl  10 mg Rectal Once  . bisoprolol  7.5 mg Oral BID  . diltiazem  180 mg Oral BID  . DULoxetine  60 mg Oral Daily  . guaiFENesin  1,200 mg Oral BID  . insulin aspart  0-15 Units Subcutaneous TID WC  . insulin aspart  0-5 Units Subcutaneous QHS  . ipratropium  0.5 mg Nebulization QID  . iron polysaccharides  150 mg Oral Daily  . levalbuterol  0.63 mg Nebulization QID  . mouth rinse  15 mL Mouth Rinse BID  . methylPREDNISolone (SOLU-MEDROL) injection  60 mg Intravenous Q6H  . mometasone-formoterol  2 puff Inhalation BID  . omega-3 acid ethyl esters  2 g Oral BID  . pantoprazole  40 mg Oral Daily  . polyethylene glycol  17 g Oral BID  . senna-docusate  1 tablet Oral BID  . sodium chloride flush  3 mL Intravenous Q12H  . terazosin  2 mg Oral QHS  . Warfarin - Pharmacist Dosing Inpatient   Does not apply Q24H        Physical Exam: Vital signs in last 24 hours: Temp:  [97.6 F (36.4 C)-98.6 F (37 C)] 97.8 F (36.6 C) (03/07 0517) Pulse Rate:  [70-94] 70 (03/07 0517) Resp:  [16-18] 16 (03/07 0517) BP: (110-128)/(57-79) 110/79 (03/07 0938) SpO2:  [90 %-97 %] 90 % (03/07 0805) Weight change:  Last BM Date: 01/14/17  Intake/Output from previous day: 03/06 0701 - 03/07 0700 In: 1853 [P.O.:1800; I.V.:3; IV Piggyback:50] Out: -  Total I/O In: 243 [P.O.:240; I.V.:3] Out: -    Physical Exam: General- pt is awake,alert, oriented to time place and person Resp- No acute REsp distress, Rhonchi+ CVS-  S1S2 irregular in rate and rhythm GIT- BS+, soft, NT, ND EXT- NO LE Edema, Cyanosis   Lab Results: CBC  Recent Labs  01/16/17 0521 01/17/17 0555  WBC 10.7* 12.3*  HGB 13.4 11.6*  HCT 37.4* 32.0*  PLT 185 170    BMET  Recent Labs  01/16/17 0521 01/17/17 0555  NA 138 133*  K 3.8 3.7  CL 98* 95*  CO2 27 29  GLUCOSE 216* 243*  BUN 96* 102*  CREATININE 3.44* 3.20*  CALCIUM 8.5* 8.0*   Creat trend 2018 2.1=>3.9=>2.9=>3.4=>3.2 2017 2.1 2016 1.8--1.9 2015  1.4--1.6 2008   1.8     MICRO No results found for this or any previous visit (from the past 240 hour(s)).    Lab Results  Component Value Date   PTH 74 (H) 01/05/2016   CALCIUM 8.0 (L) 01/17/2017   CAION 1.20 03/20/2014   PHOS 4.5 01/17/2017               Impression: 1)Renal  AKI secondary to ATN                AKI sec to SIRS( admitted with flu)  AKI on CKD               CKD stage 3.               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                Hematuria none.                Nephrolithiasis hx Absent                 AKI now better                 Pt crea now trending down again               2)HTN  Medication-  On Calcium Channel Blockers On Alpha  Blockers.    3)Anemia HGb stable  4)CKD Mineral-Bone Disorder PTH acceptable  Secondary Hyperparathyroidism present  Phosphorus at goal.   5)ID-admitted with Influenza Primary MD following  6)Electrolytes  Hypokalemic   Now better  Hyponatremic   7)Acid base Co2 at goal  8) Afib- on Cardizem and warfarin   9) Resp admitted with shortness of breath Flu and COPD Primary MD following       Plan:   Will continue current care      Lupton S 01/17/2017, 10:33 AM

## 2017-01-17 NOTE — Progress Notes (Signed)
Inpatient Diabetes Program Recommendations  AACE/ADA: New Consensus Statement on Inpatient Glycemic Control (2015)  Target Ranges:  Prepandial:   less than 140 mg/dL      Peak postprandial:   less than 180 mg/dL (1-2 hours)      Critically ill patients:  140 - 180 mg/dL   Results for Don [name], Don [name] (MRN [id number]) as of 01/17/2017 08:20  Ref. Range 01/16/2017 07:34 01/16/2017 11:45 01/16/2017 16:27 01/16/2017 21:35 01/17/2017 07:41  Glucose-Capillary Latest Ref Range: 65 - 99 mg/dL 244 (H) 332 (H) 241 (H) 218 (H) 241 (H)    Review of Glycemic Control  Diabetes history:Prediabetes Outpatient Diabetes medications: None Current orders for Inpatient glycemic control: Novolog 0-15 units TID with meals, Novolog 0-5 units QHS  Inpatient Diabetes Program Recommendations: Insulin - Meal Coverage:While ordered steroids, please consider ordering Novolog 3 units TID with meals for meal coverage if patient eats at least 50% of meals. Insulin-Basal: If steroids are continued, please consider ordering low dose basal insulin. Recommend ordering Lantus 8 units Q24H.   Thanks, Barnie Alderman, RN, MSN, CDE Diabetes Coordinator Inpatient Diabetes Program [phone] (Team Pager from 8am to 5pm)

## 2017-01-18 ENCOUNTER — Telehealth: Payer: Self-pay

## 2017-01-18 ENCOUNTER — Encounter: Payer: Medicare HMO | Admitting: Pharmacist

## 2017-01-18 LAB — GLUCOSE, CAPILLARY
GLUCOSE-CAPILLARY: 236 mg/dL — AB (ref 65–99)
Glucose-Capillary: 231 mg/dL — ABNORMAL HIGH (ref 65–99)
Glucose-Capillary: 372 mg/dL — ABNORMAL HIGH (ref 65–99)

## 2017-01-18 LAB — PROTIME-INR
INR: 2.3
Prothrombin Time: 25.7 seconds — ABNORMAL HIGH (ref 11.4–15.2)

## 2017-01-18 MED ORDER — BISOPROLOL FUMARATE 10 MG PO TABS: 10.0000 mg | ORAL_TABLET | Freq: Two times a day (BID) | ORAL | 0 refills | Status: DC

## 2017-01-18 MED ORDER — POLYETHYLENE GLYCOL 3350 17 G PO PACK: 17.0000 g | PACK | Freq: Two times a day (BID) | ORAL | 0 refills | Status: DC

## 2017-01-18 MED ORDER — INSULIN ASPART 100 UNIT/ML ~~LOC~~ SOLN
3.0000 [IU] | Freq: Three times a day (TID) | SUBCUTANEOUS | Status: DC
  Administered 2017-01-18 (×2): 3 [IU] via SUBCUTANEOUS

## 2017-01-18 MED ORDER — DILTIAZEM HCL ER COATED BEADS 180 MG PO CP24: 180.0000 mg | ORAL_CAPSULE | Freq: Two times a day (BID) | ORAL | 0 refills | Status: DC

## 2017-01-18 MED ORDER — BISOPROLOL FUMARATE 5 MG PO TABS
10.0000 mg | ORAL_TABLET | Freq: Two times a day (BID) | ORAL | Status: DC
  Filled 2017-01-18 (×2): qty 2

## 2017-01-18 MED ORDER — WARFARIN SODIUM 2 MG PO TABS
4.0000 mg | ORAL_TABLET | Freq: Once | ORAL | Status: AC
  Administered 2017-01-18: 4 mg via ORAL
  Filled 2017-01-18: qty 2

## 2017-01-18 MED ORDER — ALBUTEROL SULFATE HFA 108 (90 BASE) MCG/ACT IN AERS: 2.0000 | INHALATION_SPRAY | Freq: Four times a day (QID) | RESPIRATORY_TRACT | 0 refills | Status: DC | PRN

## 2017-01-18 MED ORDER — TIOTROPIUM BROMIDE MONOHYDRATE 18 MCG IN CAPS: 18.0000 ug | ORAL_CAPSULE | Freq: Every day | RESPIRATORY_TRACT | 0 refills | Status: DC

## 2017-01-18 MED ORDER — PREDNISONE 20 MG PO TABS: ORAL_TABLET | ORAL | 0 refills | Status: DC

## 2017-01-18 MED ORDER — INSULIN GLARGINE 100 UNIT/ML ~~LOC~~ SOLN
6.0000 [IU] | Freq: Every day | SUBCUTANEOUS | Status: DC
  Filled 2017-01-18: qty 0.06

## 2017-01-18 NOTE — Progress Notes (Signed)
Subjective: Interval History: Still he has some cough but seems to be getting better. His has some weakness from staying in the hospital. He denies any nausea or vomiting.   Objective: Vital signs in last 24 hours: Temp:  [97.6 F (36.4 C)-97.7 F (36.5 C)] 97.7 F (36.5 C) (03/08 0500) Pulse Rate:  [86-109] 109 (03/08 0500) Resp:  [18] 18 (03/08 0500) BP: (112-136)/(62-90) 128/80 (03/08 1028) SpO2:  [90 %-100 %] 97 % (03/08 0801) Weight change:   Intake/Output from previous day: 03/07 0701 - 03/08 0700 In: 1209 [P.O.:1200; I.V.:9] Out: -  Intake/Output this shift: Total I/O In: 3 [I.V.:3] Out: -   General appearance: alert, cooperative and no distress Resp: diminished breath sounds bilaterally and wheezes bilaterally Cardio: regular rate and rhythm Extremities: No edema  Lab Results:  Recent Labs  01/16/17 0521 01/17/17 0555  WBC 10.7* 12.3*  HGB 13.4 11.6*  HCT 37.4* 32.0*  PLT 185 170   BMET:   Recent Labs  01/16/17 0521 01/17/17 0555  NA 138 133*  K 3.8 3.7  CL 98* 95*  CO2 27 29  GLUCOSE 216* 243*  BUN 96* 102*  CREATININE 3.44* 3.20*  CALCIUM 8.5* 8.0*   No results for input(s): PTH in the last 72 hours. Iron Studies: No results for input(s): IRON, TIBC, TRANSFERRIN, FERRITIN in the last 72 hours.  Studies/Results: Dg Abd 1 View  Result Date: 01/17/2017 CLINICAL DATA:  Left lower quadrant pain EXAM: ABDOMEN - 1 VIEW COMPARISON:  06/26/2007 FINDINGS: Nonobstructed bowel gas pattern with large amount of stool throughout the colon. Vague opacities in the right lower quadrant could represent pill fragments or ingested material. IMPRESSION: Nonobstructed bowel gas pattern with large amount of stool in the colon Electronically Signed   By: Donavan Foil M.D.   On: 01/17/2017 19:30    I have reviewed the patient's current medications.  Assessment/Plan: Problem #1 acute kidney injury superimposed on chronic.  Possibly secondary to ATN versus  cardiorenal. His renal function is improving. Presently doesn't have any uremic sinus symptoms. Problem #2 chronic renal failure: Possibly early stage IV versus late stage III. Problem #3 difficulty breathing: Possibly a combination of influenza and exacerbation of COPD. he has some cough but no sputum production. His feeling better.  Problem #4 metabolic bone disease: His calcium and phosphorus is range. Problem #5 hypertension: His blood pressure is reasonably controlled Problem #6 history of a trial fibrillation:  His heart rate seems to be better controlled. Problem #7 hyponatremia: Possibly hypovolemic hyponatremia :  His sodium slightly low. Problem #8 hypokalemia: His potassium is normal.  Plan: 1] will continue his present management. And patient advised to use Demadex 20 mg once a day on when necessary basis.           2] will check his renal panel in the morning.    LOS: 13 days   Don [name] S 01/18/2017,10:43 AM

## 2017-01-18 NOTE — Progress Notes (Signed)
SATURATION QUALIFICATIONS: (This note is used to comply with regulatory documentation for home oxygen)  Patient Saturations on Room Air at Rest = 94%  Patient Saturations on Room Air while Ambulating = 90%    

## 2017-01-18 NOTE — Care Management (Signed)
[facility] services arranged. (see note from 01/08/2017). Patient does not qualify for oxygen. INR will be checked by [facility] 01/19/2017. No other CM needs.

## 2017-01-18 NOTE — Clinical Social Work Note (Signed)
CSW met with pt's son, Joneen Caraway at bedside to discuss disposition. He was hopeful that pt would be able to go to SNF. CSW explained that pt does not meet criteria for SNF and recommendation is for home health with supervision vs ALF. Aware of private pay option, but refuses. He does not feel that pt would be agreeable to ALF and said that he could have almost around the clock supervision for awhile at home. Pt's home is handicap accessible. They request home health services. CM notified. MD in room.   Benay Pike, [place]

## 2017-01-18 NOTE — Care Management Important Message (Signed)
Important Message  Patient Details  Name: NICHOLE KELTNER MRN: [id number] Date of Birth: [birth year]   Medicare Important Message Given:  Yes    Apollos Tenbrink, Chauncey Reading, RN 01/18/2017, 4:03 PM

## 2017-01-18 NOTE — Progress Notes (Signed)
Inpatient Diabetes Program Recommendations  AACE/ADA: New Consensus Statement on Inpatient Glycemic Control (2015)  Target Ranges:  Prepandial:   less than 140 mg/dL      Peak postprandial:   less than 180 mg/dL (1-2 hours)      Critically ill patients:  140 - 180 mg/dL   Results for JOFFREY, KERCE (MRN [id number]) as of 01/18/2017 06:23  Ref. Range 01/17/2017 07:41 01/17/2017 11:13 01/17/2017 16:05 01/17/2017 20:47  Glucose-Capillary Latest Ref Range: 65 - 99 mg/dL 241 (H) 291 (H) 262 (H) 232 (H)   Review of Glycemic Control Diabetes history:Prediabetes Outpatient Diabetes medications: None Current orders for Inpatient glycemic control: Novolog 0-15 units TID with meals, Novolog 0-5 units QHS  Inpatient Diabetes Program Recommendations: Insulin - Meal Coverage:While ordered steroids, please consider ordering Novolog 3units TID with meals for meal coverage if patient eats at least 50% of meals. Insulin-Basal: If steroids are continued, please consider ordering low dose basal insulin. Recommend ordering Lantus 8 units Q24H.   Thanks, Barnie Alderman, RN, MSN, CDE Diabetes Coordinator Inpatient Diabetes Program [phone] (Team Pager from 8am to 5pm)

## 2017-01-18 NOTE — Progress Notes (Signed)
Pt's IV catheter removed and intact. Pt's IV site clean dry and intact. Discharge instructions including medications and follow up appointments reviewed and discussed with patient's son Joneen Caraway. All questions were answered and no further questions at this time. Pt's son verbalized understanding of discharge instructions including medications and follow up appointments. Pt in stable condition and in no acute distress at time of discharge. Pt escorted by nurse tech.

## 2017-01-18 NOTE — Telephone Encounter (Signed)
-----   Message from Arnoldo Lenis, MD sent at 01/18/2017 10:29 AM EST ----- Can we contact this patients coumadin clinic, he sees Tammy Eckard at [facility] med to arrange a coumadin appt in 1 weeks. He missed appts during hospital admission   Zandra Abts MD

## 2017-01-18 NOTE — Clinical Social Work Note (Addendum)
Clinical Social Work Assessment  Patient Details  Name: Don [name] MRN: [id number] Date of Birth: 1940-11-18  Date of referral:  01/18/17               Reason for consult:  Discharge Planning                Permission sought to share information with:    Permission granted to share information::     Name::        Agency::     Relationship::     Contact Information:     Housing/Transportation Living arrangements for the past 2 months:  Single Family Home Source of Information:  Patient Patient Interpreter Needed:  None Criminal Activity/Legal Involvement Pertinent to Current Situation/Hospitalization:  No - Comment as needed Significant Relationships:  Adult Children Lives with:  Self Do you feel safe going back to the place where you live?  Yes Need for family participation in patient care:  Yes (Comment)  Care giving concerns:  Pt lives alone and is not at baseline.    Social Worker assessment / plan:  CSW met with pt at bedside. Pt alert and oriented and reports he lives alone. His wife has been a resident at [facility] for about 3 years. Pt indicates he manages well at baseline and still drives. He has been in hospital 13 days and admits to weakness. PT evaluated pt and recommends home health vs ALF. CSW discussed ALF placement and he states he does not want to go to facility from hospital. He may consider [facility] if necessary from home, but he needs to take care of some things first. Pt states he will figure out what he needs once he returns home. His son lives nearby. Pt reports he is interested in Meals on Wheels. CSW provided contact information. CSW signing off.   Employment status:  Retired Nurse, adult PT Recommendations:  Home with [place] / Referral to community resources:  Other (Comment Required) (ALF list)  Patient/Family's Response to care:  Pt plans to return home and will consider ALF in the future if needed.    Patient/Family's Understanding of and Emotional Response to Diagnosis, Current Treatment, and Prognosis:  Pt aware of admission diagnosis and treatment plan.   Emotional Assessment Appearance:  Appears stated age Attitude/Demeanor/Rapport:  Other (Cooperative) Affect (typically observed):  Accepting Orientation:  Oriented to Self, Oriented to Place, Oriented to  Time, Oriented to Situation Alcohol / Substance use:  Not Applicable Psych involvement (Current and /or in the community):  No (Comment)  Discharge Needs  Concerns to be addressed:  Discharge Planning Concerns Readmission within the last 30 days:  No Current discharge risk:  Lives alone Barriers to Discharge:  Continued Medical Work up   [employer], [place] 01/18/2017, 8:47 AM [phone]

## 2017-01-18 NOTE — Discharge Summary (Signed)
Physician Discharge Summary  Don [name]:[id number] DOB: July 15, 1941 DOA: 01/05/2017  PCP: Wardell Honour, MD  Admit date: 01/05/2017 Discharge date: 01/18/2017  Admitted From: Home  Disposition:  Home   Recommendations for Outpatient Follow-up:  1. Get INR checked in 1-2 days 2. Please obtain BMP/CBC in one week 3. See PCP within 1 week 4. Follow up with nephrology within 1-2 weeks 5. Follow up with cardiology at scheduled appointment   Home Health: Yes- PT/OT/RN/Aide/SW Equipment/Devices: None  Discharge Condition: Stable but guarded- concern for patient's ability to care for himself at home CODE STATUS: Partial Code Diet recommendation: Heart Healthy  Brief/Interim Summary: 76 year old male with history of COPD and tobacco abuse, came to the emergency room with complaints of shortness of breath. Found to have COPD exacerbation and tested positive for influenza. He also had rapid atrial fibrillation requiring Cardizem infusion. Started on steroids, bronchodilators and antibiotics. Respiratory status has been slow to improve. Rapid atrial fibrillation being driven by respiratory status. Cardiology following. Also has AKI on CKD3 and nephrology is following. Slowly improving respiratory wise and Cr steadily trending down very slowly. Still SOB and states he is having Left sided pain when coughing. Stable to transfer to the Medical Floor with Telemetry 01/15/17. Still complaining of Flank Pain so will order a KUB.  Discharge Diagnoses:  Active Problems:   Hypertension   Tobacco abuse   Stage III chronic kidney disease   COPD exacerbation (HCC)   Atrial fibrillation with RVR (HCC)   Acute respiratory failure with hypoxia (HCC)   AKI (acute kidney injury) (HCC)   Influenza A   Respiratory distress   SOB (shortness of breath)  Acute respiratory failure with hypoxia related to COPD exacerbation and Influenza A. stable -ambulatory pulse ox done today- patient not requiring  oxygen  -Maintain O2 Saturations >92% - off oxygen and maintaining saturations  COPD exacerbation.  -Continue the patient on IV Methylprednisolone 60 mg q12h (changed from q6h) - D/C'd Abx with Levofloxacin 500 mg po q48h as treatment is complete -Spiriva, Symbicort and albuterol at home -C/w Pantoprazole 40 mg po Daily while on IV Steroids  -C/w Flutter Valve and Incentive Spirometry at home   Atrial fibrillation with RVR. stable -Likely precipitated by underlying respiratory issues.  -C/wLong-Acting Diltiazem 180 mg BID by Cardiology -Bisoprolol 10 mg po BID by Cardiology;  -Digoxin not good option due to renal issues.  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  -Avoid Nephrotoxics -will need follow up with nephrology within 1-2 weeks as well as BMP within 3-5 days  Hypertension.  -Blood pressures currently stable.  -Continue current treatments with Diltiazem 180 mg po Long Acting and Bisoprolol 10 mg po BID.   Hypokalemia -Resolved  Hypomagnesemia -Patient's Mag Level today of 1.9  Tobacco Use.  -Counseled on importance of tobacco cessation.  Influenza A.  -Droplet Precautions while in the Hospital discontinued by Inpatient Nurse Kurtis Bushman -S/p Oseltamivir 30 mg po Daily x 5 days because of AKI  Hyperglycemia. -Likely related to IV Steroids. -HbA1c was 6.0.  - steroid taper outpatient  Leukocytosis  -Improved  Hyponatremia, improved -133 this am -Likely from Hypovolemia -Continue to Trend CMP's  Constipation - BM yesterday -Scheduled Miralax 17 grams po BID -Started Senna-Doucsate 1 tab po BID -Gave Bisacodyl Suppository x1 dose  Left Intermittent Rib Pain/Flank  -Worse with Coughing - no pain on exam  today and patient did not voice any during interview  Discharge Instructions  Discharge Instructions    AMB Referral to [place] Management    Complete by:  As directed    Reason for consult:  Telephonic post hospital discharge follow up   Diagnoses of:  COPD/ Pneumonia   Expected date of contact:  1-3 days (reserved for hospital discharges)   Please assign patient fortelephonic nurse to engage for transition of care calls and evaluate for other available [facility] services. For questions please contact:   Janci Minor RN, [facility] Liaison [phone])     Allergies as of 01/18/2017      Reactions   Wellbutrin [bupropion] Anxiety      Medication List    STOP taking these medications   doxycycline 100 MG tablet Commonly known as:  ADOXA   doxycycline 100 MG tablet Commonly known as:  VIBRA-TABS   furosemide 40 MG tablet Commonly known as:  LASIX   metoprolol tartrate 25 MG tablet Commonly known as:  LOPRESSOR     TAKE these medications   albuterol 108 (90 Base) MCG/ACT inhaler Commonly known as:  PROVENTIL HFA;VENTOLIN HFA Inhale 2 puffs into the lungs every 6 (six) hours as needed for wheezing or shortness of breath.   ALLERGY RELIEF 10 MG tablet Generic drug:  loratadine TAKE 1 TABLET DAILY FOR ALLERGY   atorvastatin 20 MG tablet Commonly known as:  LIPITOR TAKE 1 TABLET DAILY   bisoprolol 10 MG tablet Commonly known as:  ZEBETA Take 1 tablet (10 mg total) by mouth 2 (two) times daily.   budesonide-formoterol 160-4.5 MCG/ACT inhaler Commonly known as:  SYMBICORT Inhale 2 puffs into the lungs 2 (two) times daily.   clonazePAM 1 MG tablet Commonly known as:  KLONOPIN TAKE 1/2 TABLET ONCE DAILY AS NEEDED FOR ANXIETY   diltiazem 180 MG 24 hr capsule Commonly known as:  CARDIZEM CD Take 1 capsule (180 mg total) by mouth 2 (two) times daily. What changed:  See the new instructions.   doxylamine (Sleep) 25 MG tablet Commonly known as:  UNISOM Take 25  mg by mouth at bedtime as needed.   DULoxetine 60 MG capsule Commonly known as:  CYMBALTA TAKE (1) CAPSULE DAILY   FERREX 150 150 MG capsule Generic drug:  iron polysaccharides Take 1 capsule by mouth daily.   Incontinence Brief Large Misc Use as needed for urinary incontinence.  Dx: urinary incontinence R32 and prostate cancer C61   multivitamin with minerals Tabs tablet Take 1 tablet by mouth daily.   nitroGLYCERIN 0.4 MG SL tablet Commonly known as:  NITROSTAT Place 1 tablet (0.4 mg total) under the tongue every 5 (five) minutes as needed for chest pain.   omega-3 acid ethyl esters 1 g capsule Commonly known as:  LOVAZA TAKE (2) CAPSULES TWICE DAILY.   pantoprazole 40 MG tablet Commonly known as:  PROTONIX TAKE 1 TABLET DAILY   polyethylene glycol packet Commonly known as:  MIRALAX / GLYCOLAX Take 17 g by mouth 2 (two) times daily.   predniSONE 20 MG tablet Commonly known as:  DELTASONE 2 tabs PO X4 days then 1.5tabs PO daily x4 days then 1tab PO daily x4 days then 0.5tabs PO daily x4 days   PRO-BIOTIC BLEND Caps Take 1 capsule by mouth daily.   SYSTANE OP Place 1 drop into both eyes 2 (two) times daily as needed (dry eyes).  terazosin 2 MG capsule Commonly known as:  HYTRIN TAKE 1 CAPSULE AT BEDTIME   tiotropium 18 MCG inhalation capsule Commonly known as:  SPIRIVA Place 1 capsule (18 mcg total) into inhaler and inhale daily.   triamcinolone cream 0.1 % Commonly known as:  KENALOG Apply 1 application topically 2 (two) times daily.   warfarin 4 MG tablet Commonly known as:  COUMADIN Take 1 and 1/2 tablets (= '6mg'$ ) daily except on Sundays and Thursdays take 1 tablet (='4mg'$ )      Follow-up Information    Jory Sims, NP Follow up on 02/01/2017.   Specialties:  Nurse Practitioner, Radiology, Cardiology Why:  at 2:00 pm Contact information: [place] [address] [phone]        [facility] S, MD. Schedule an appointment  as soon as possible for a visit in 2 week(s).   Specialty:  Nephrology Contact information: [address] [place] [address] [phone]        Wardell Honour, MD. Schedule an appointment as soon as possible for a visit in 1 week(s).   Specialty:  Family Medicine Contact information: [place] [address] [phone]          Allergies  Allergen Reactions  . Wellbutrin [Bupropion] Anxiety    Consultations:  Cardiology  Nephrology  PT  CM, SW   Procedures/Studies: Dg Abd 1 View  Result Date: 01/17/2017 CLINICAL DATA:  Left lower quadrant pain EXAM: ABDOMEN - 1 VIEW COMPARISON:  06/26/2007 FINDINGS: Nonobstructed bowel gas pattern with large amount of stool throughout the colon. Vague opacities in the right lower quadrant could represent pill fragments or ingested material. IMPRESSION: Nonobstructed bowel gas pattern with large amount of stool in the colon Electronically Signed   By: Donavan Foil M.D.   On: 01/17/2017 19:30   Dg Chest Port 1 View  Result Date: 01/15/2017 CLINICAL DATA:  Shortness of breath. EXAM: PORTABLE CHEST 1 VIEW COMPARISON:  Radiographs January 14, 2017. FINDINGS: The heart size and mediastinal contours are within normal limits. No pneumothorax or pleural effusion is noted. Mild central pulmonary vascular congestion is noted. The visualized skeletal structures are unremarkable. IMPRESSION: Mild central pulmonary vascular congestion. Electronically Signed   By: Marijo Conception, M.D.   On: 01/15/2017 07:42   Dg Chest Port 1 View  Result Date: 01/14/2017 CLINICAL DATA:  Shortness of breath. EXAM: PORTABLE CHEST 1 VIEW COMPARISON:  Radiograph of January 05, 2017. FINDINGS: The heart size and mediastinal contours are within normal limits. Both lungs are clear. No pneumothorax or pleural effusion is noted. The visualized skeletal structures are unremarkable. IMPRESSION: No acute cardiopulmonary abnormality seen. Electronically  Signed   By: Marijo Conception, M.D.   On: 01/14/2017 07:34   Dg Chest Port 1 View  Result Date: 01/05/2017 CLINICAL DATA:  Short of breath 4 days EXAM: PORTABLE CHEST 1 VIEW COMPARISON:  Radiograph 2126 FINDINGS: Normal cardiac silhouette. No effusion, infiltrate, or pneumothorax. Patient rotated rightward. No acute osseous abnormality. IMPRESSION: No acute cardiopulmonary process. Electronically Signed   By: Suzy Bouchard M.D.   On: 01/05/2017 09:58    Subjective: Patient seen and examined.  He voices he isn't sure if he will be able to manage at home but he wants to try.  Son is bedside.  Asking to be discharge after supper.  Will have [place] set up for after discharge.  Discharge Exam: Vitals:   01/18/17 1028 01/18/17 1510  BP: 128/80 110/66  Pulse:  93  Resp:  18  Temp:  98 F (36.7 C)   Vitals:   01/18/17 0801 01/18/17 1028 01/18/17 1250 01/18/17 1510  BP:  128/80  110/66  Pulse:    93  Resp:    18  Temp:    98 F (36.7 C)  TempSrc:    Oral  SpO2: 97%  91% 93%  Weight:      Height:        General: Pt is alert, awake, not in acute distress Cardiovascular: RRR, S1/S2 +, no rubs, no gallops Respiratory: rhonchi and upper airway noised in upper lobes bilaterally but clear otherwise and normal respiratory effort Abdominal: Soft, NT, ND, bowel sounds + Extremities: no edema, no cyanosis    The results of significant diagnostics from this hospitalization (including imaging, microbiology, ancillary and laboratory) are listed below for reference.     Microbiology: No results found for this or any previous visit (from the past 240 hour(s)).   Labs: BNP (last 3 results)  Recent Labs  01/05/17 0846  BNP 387.5*   Basic Metabolic Panel:  Recent Labs Lab 01/13/17 0540 01/14/17 0434 01/15/17 0429 01/16/17 0521 01/17/17 0555  NA 137  138 139 138 138 133*  K 3.6  3.6 3.3* 3.2* 3.8 3.7  CL 108  108 106 99* 98* 95*  CO2 19*  20* '24 27 27 29  '$ GLUCOSE 208*  210*  179* 246* 216* 243*  BUN 79*  78* 81* 83* 96* 102*  CREATININE 3.09*  3.02* 2.92* 3.23* 3.44* 3.20*  CALCIUM 8.1*  8.1* 8.3* 8.3* 8.5* 8.0*  MG 1.3* 1.4* 1.8 1.6* 1.9  PHOS 4.3  4.3 4.4 4.8* 4.7* 4.5   Liver Function Tests:  Recent Labs Lab 01/13/17 0540 01/14/17 0434 01/15/17 0429 01/16/17 0521 01/17/17 0555  AST '30 27 26 27 29  '$ ALT '30 29 28 29 29  '$ ALKPHOS 48 51 53 54 55  BILITOT 0.9 0.8 0.9 0.8 1.1  PROT 4.7* 4.8* 4.7* 4.8* 4.5*  ALBUMIN 2.3*  2.2* 2.4* 2.3* 2.4* 2.2*   No results for input(s): LIPASE, AMYLASE in the last 168 hours. No results for input(s): AMMONIA in the last 168 hours. CBC:  Recent Labs Lab 01/13/17 0540 01/14/17 0434 01/15/17 0429 01/16/17 0521 01/17/17 0555  WBC 8.6 10.1 9.8 10.7* 12.3*  NEUTROABS 7.9* 9.4* 9.1* 10.2* 11.7*  HGB 12.4* 12.7* 13.2 13.4 11.6*  HCT 34.8* 35.0* 36.1* 37.4* 32.0*  MCV 94.8 94.3 93.5 93.5 92.8  PLT 201 200 200 185 170   Cardiac Enzymes: No results for input(s): CKTOTAL, CKMB, CKMBINDEX, TROPONINI in the last 168 hours. BNP: Invalid input(s): POCBNP CBG:  Recent Labs Lab 01/17/17 1113 01/17/17 1605 01/17/17 2047 01/18/17 0748 01/18/17 1128  GLUCAP 291* 262* 232* 231* 372*   D-Dimer No results for input(s): DDIMER in the last 72 hours. Hgb A1c No results for input(s): HGBA1C in the last 72 hours. Lipid Profile No results for input(s): CHOL, HDL, LDLCALC, TRIG, CHOLHDL, LDLDIRECT in the last 72 hours. Thyroid function studies No results for input(s): TSH, T4TOTAL, T3FREE, THYROIDAB in the last 72 hours.  Invalid input(s): FREET3 Anemia work up No results for input(s): VITAMINB12, FOLATE, FERRITIN, TIBC, IRON, RETICCTPCT in the last 72 hours. Urinalysis    Component Value Date/Time   COLORURINE YELLOW 04/03/2014 2308   APPEARANCEUR CLEAR 04/03/2014 2308   LABSPEC 1.025 04/03/2014 2308   PHURINE 5.5 04/03/2014 2308   GLUCOSEU NEGATIVE 04/03/2014 2308   HGBUR MODERATE (A) 04/03/2014 2308  BILIRUBINUR neg 10/07/2014 1122   KETONESUR NEGATIVE 04/03/2014 2308   PROTEINUR 300+++ 10/07/2014 1122   PROTEINUR 100 (A) 04/03/2014 2308   UROBILINOGEN negative 10/07/2014 1122   UROBILINOGEN 0.2 04/03/2014 2308   NITRITE neg 10/07/2014 1122   NITRITE NEGATIVE 04/03/2014 2308   LEUKOCYTESUR Negative 10/07/2014 1122   Sepsis Labs Invalid input(s): PROCALCITONIN,  WBC,  LACTICIDVEN Microbiology No results found for this or any previous visit (from the past 240 hour(s)).   Time coordinating discharge: 45 minutes  SIGNED:   Loretha Stapler, MD  [facility] 01/18/2017, 3:37 PM Pager [phone] If 7PM-7AM, please contact night-coverage [email or web address] Password [email or web address]

## 2017-01-18 NOTE — Progress Notes (Signed)
[place] for Coumadin (chronic Rx PTA) Indication: atrial fibrillation  Allergies  Allergen Reactions  . Wellbutrin [Bupropion] Anxiety    Patient Measurements: Height: '5\' 9"'$  (175.3 cm) Weight: 176 lb 2.4 oz (79.9 kg) IBW/kg (Calculated) : 70.7  Vital Signs: Temp: 97.7 F (36.5 C) (03/08 0500) Temp Source: Oral (03/08 0500) BP: 128/80 (03/08 1028) Pulse Rate: 109 (03/08 0500)  Labs:  Recent Labs  01/16/17 0521 01/17/17 0555 01/18/17 0528  HGB 13.4 11.6*  --   HCT 37.4* 32.0*  --   PLT 185 170  --   LABPROT 36.6* 35.1* 25.7*  INR 3.58 3.40 2.30  CREATININE 3.44* 3.20*  --     Estimated Creatinine Clearance: 19.3 mL/min (by C-G formula based on SCr of 3.2 mg/dL (H)).   Medical History: Past Medical History:  Diagnosis Date  . Acute bronchitis 04/03/2014  . Anxiety   . Atrial fibrillation ([place])   . Cataract   . COPD (chronic obstructive pulmonary disease) ([place])   . Essential hypertension   . Hyperlipidemia   . Macular degeneration   . Noncompliance with medications 04/2013   Xarelto, digoxin previously  . Pre-diabetes   . Prostate cancer ([place])   . Stage III chronic kidney disease 04/03/2014  . Tubular adenoma of colon 07/31/02, 11/18/03    Medications:  Prescriptions Prior to Admission  Medication Sig Dispense Refill Last Dose  . albuterol (PROVENTIL HFA;VENTOLIN HFA) 108 (90 BASE) MCG/ACT inhaler Inhale 2 puffs into the lungs every 6 (six) hours as needed for wheezing or shortness of breath. 1 Inhaler 2 Past Week at Unknown time  . ALLERGY RELIEF 10 MG tablet TAKE 1 TABLET DAILY FOR ALLERGY 30 tablet 5 Past Week at Unknown time  . atorvastatin (LIPITOR) 20 MG tablet TAKE 1 TABLET DAILY 30 tablet 2 Past Week at Unknown time  . budesonide-formoterol (SYMBICORT) 160-4.5 MCG/ACT inhaler Inhale 2 puffs into the lungs 2 (two) times daily. 1 Inhaler 3 Past Week at Unknown time  . clonazePAM (KLONOPIN) 1 MG tablet TAKE 1/2  TABLET ONCE DAILY AS NEEDED FOR ANXIETY 15 tablet 0 Past Week at Unknown time  . diltiazem (CARDIZEM CD) 300 MG 24 hr capsule TAKE (1) CAPSULE DAILY 30 capsule 1 Past Week at Unknown time  . doxycycline (ADOXA) 100 MG tablet Take 1 tablet (100 mg total) by mouth 2 (two) times daily. 20 tablet 0 Past Week at Unknown time  . doxycycline (VIBRA-TABS) 100 MG tablet Take 1 tablet by mouth 2 (two) times daily.   Past Week at Unknown time  . doxylamine, Sleep, (UNISOM) 25 MG tablet Take 25 mg by mouth at bedtime as needed.   Past Week at Unknown time  . DULoxetine (CYMBALTA) 60 MG capsule TAKE (1) CAPSULE DAILY 30 capsule 1 Past Week at Unknown time  . FERREX 150 150 MG capsule Take 1 capsule by mouth daily.   Past Week at Unknown time  . furosemide (LASIX) 40 MG tablet Take 1 tablet by mouth every other day.    Past Week at Unknown time  . metoprolol tartrate (LOPRESSOR) 25 MG tablet TAKE (1) TABLET TWICE A DAY. 60 tablet 4 Past Week at Unknown time  . Multiple Vitamin (MULTIVITAMIN WITH MINERALS) TABS Take 1 tablet by mouth daily.   Past Week at Unknown time  . nitroGLYCERIN (NITROSTAT) 0.4 MG SL tablet Place 1 tablet (0.4 mg total) under the tongue every 5 (five) minutes as needed for chest pain. 25 tablet 2  Past Week at Unknown time  . omega-3 acid ethyl esters (LOVAZA) 1 G capsule TAKE (2) CAPSULES TWICE DAILY. 120 capsule 2 Past Week at Unknown time  . pantoprazole (PROTONIX) 40 MG tablet TAKE 1 TABLET DAILY 30 tablet 2 Past Week at Unknown time  . Polyethyl Glycol-Propyl Glycol (SYSTANE OP) Place 1 drop into both eyes 2 (two) times daily as needed (dry eyes).    Past Week at Unknown time  . Probiotic Product (PRO-BIOTIC BLEND) CAPS Take 1 capsule by mouth daily.   Past Week at Unknown time  . terazosin (HYTRIN) 2 MG capsule TAKE 1 CAPSULE AT BEDTIME 30 capsule 4 Past Week at Unknown time  . tiotropium (SPIRIVA) 18 MCG inhalation capsule Place 1 capsule (18 mcg total) into inhaler and inhale daily. 30  capsule 3 Past Week at Unknown time  . triamcinolone cream (KENALOG) 0.1 % Apply 1 application topically 2 (two) times daily. 80 g 0 Past Week at Unknown time  . warfarin (COUMADIN) 4 MG tablet Take 1 and 1/2 tablets (= '6mg'$ ) daily except on Sundays and Thursdays take 1 tablet (='4mg'$ ) 45 tablet 0 Past Week at Unknown time  . Incontinence Supply Disposable (INCONTINENCE BRIEF LARGE) MISC Use as needed for urinary incontinence.  Dx: urinary incontinence R32 and prostate cancer C61 200 each 1 Taking   Assessment: 76yo male on chronic Coumadin PTA.  INR has trended down to goal range.   Pt has not had any Warfarin since 3/4.    Goal of Therapy:  INR 2-3   Plan:  Coumadin '4mg'$  today x 1 PT-INR daily Monitor for s/s of bleeding  Ena Dawley, [facility] 01/18/2017 10:38 AM

## 2017-01-18 NOTE — Telephone Encounter (Signed)
[place] to schedule pt for a coumadin check. His appointment is Thursday March 15th @ 12:15. Left message on pt's private voicemail. I will mail him a letter.

## 2017-01-18 NOTE — Progress Notes (Signed)
Cardiology following telemetry peripherally for afib rate control. He is on bisoprolol to 7.'5mg'$  bid, he remains on dilt '180mg'$  bidDegree of tachycardia is expected given his respiratory issues. Degree of tachycardia is expected given his respiratory issues. Rates by tele 110s-120s. Stable bp's, we will increase bisoprolol to '10mg'$  bid.    Carlyle Dolly MD

## 2017-01-19 ENCOUNTER — Telehealth: Payer: Self-pay | Admitting: *Deleted

## 2017-01-19 ENCOUNTER — Ambulatory Visit (INDEPENDENT_AMBULATORY_CARE_PROVIDER_SITE_OTHER): Payer: Medicare HMO | Admitting: Pharmacist

## 2017-01-19 DIAGNOSIS — H3530 Unspecified macular degeneration: Secondary | ICD-10-CM | POA: Diagnosis not present

## 2017-01-19 DIAGNOSIS — J441 Chronic obstructive pulmonary disease with (acute) exacerbation: Secondary | ICD-10-CM | POA: Diagnosis not present

## 2017-01-19 DIAGNOSIS — I4891 Unspecified atrial fibrillation: Secondary | ICD-10-CM | POA: Diagnosis not present

## 2017-01-19 DIAGNOSIS — I480 Paroxysmal atrial fibrillation: Secondary | ICD-10-CM

## 2017-01-19 DIAGNOSIS — N183 Chronic kidney disease, stage 3 (moderate): Secondary | ICD-10-CM | POA: Diagnosis not present

## 2017-01-19 DIAGNOSIS — J09X2 Influenza due to identified novel influenza A virus with other respiratory manifestations: Secondary | ICD-10-CM | POA: Diagnosis not present

## 2017-01-19 DIAGNOSIS — Z8546 Personal history of malignant neoplasm of prostate: Secondary | ICD-10-CM | POA: Diagnosis not present

## 2017-01-19 DIAGNOSIS — F17290 Nicotine dependence, other tobacco product, uncomplicated: Secondary | ICD-10-CM | POA: Diagnosis not present

## 2017-01-19 DIAGNOSIS — F17210 Nicotine dependence, cigarettes, uncomplicated: Secondary | ICD-10-CM | POA: Diagnosis not present

## 2017-01-19 DIAGNOSIS — I129 Hypertensive chronic kidney disease with stage 1 through stage 4 chronic kidney disease, or unspecified chronic kidney disease: Secondary | ICD-10-CM | POA: Diagnosis not present

## 2017-01-19 LAB — POCT INR: INR: 1.8

## 2017-01-19 NOTE — Telephone Encounter (Signed)
INR slightly subtherapeutic but patient is taking prednisone.  I spoke with his pharmacy and they have already packaged warfarin with dose recommended form hospital for this week (warfarin '4mg'$  Sunday and Thursday and '6mg'$  all other days) Ok to keep this dose.  Order placed with [facility] nurse to recheck INR Monday 01/22/17 due to prednisone therapy.

## 2017-01-19 NOTE — Telephone Encounter (Signed)
Just got out of hospital. His orders are Warfarin '4mg'$ . Take one and a half qd, except Thurs and Sunday? INR today 1.8

## 2017-01-22 DIAGNOSIS — N183 Chronic kidney disease, stage 3 (moderate): Secondary | ICD-10-CM | POA: Diagnosis not present

## 2017-01-22 DIAGNOSIS — I129 Hypertensive chronic kidney disease with stage 1 through stage 4 chronic kidney disease, or unspecified chronic kidney disease: Secondary | ICD-10-CM | POA: Diagnosis not present

## 2017-01-22 DIAGNOSIS — J441 Chronic obstructive pulmonary disease with (acute) exacerbation: Secondary | ICD-10-CM | POA: Diagnosis not present

## 2017-01-22 DIAGNOSIS — J09X2 Influenza due to identified novel influenza A virus with other respiratory manifestations: Secondary | ICD-10-CM | POA: Diagnosis not present

## 2017-01-22 DIAGNOSIS — F17290 Nicotine dependence, other tobacco product, uncomplicated: Secondary | ICD-10-CM | POA: Diagnosis not present

## 2017-01-22 DIAGNOSIS — Z8546 Personal history of malignant neoplasm of prostate: Secondary | ICD-10-CM | POA: Diagnosis not present

## 2017-01-22 DIAGNOSIS — I4891 Unspecified atrial fibrillation: Secondary | ICD-10-CM | POA: Diagnosis not present

## 2017-01-22 DIAGNOSIS — F17210 Nicotine dependence, cigarettes, uncomplicated: Secondary | ICD-10-CM | POA: Diagnosis not present

## 2017-01-22 DIAGNOSIS — H3530 Unspecified macular degeneration: Secondary | ICD-10-CM | POA: Diagnosis not present

## 2017-01-23 ENCOUNTER — Other Ambulatory Visit: Payer: Self-pay | Admitting: *Deleted

## 2017-01-23 DIAGNOSIS — N183 Chronic kidney disease, stage 3 (moderate): Secondary | ICD-10-CM | POA: Diagnosis not present

## 2017-01-23 DIAGNOSIS — F17290 Nicotine dependence, other tobacco product, uncomplicated: Secondary | ICD-10-CM | POA: Diagnosis not present

## 2017-01-23 DIAGNOSIS — Z8546 Personal history of malignant neoplasm of prostate: Secondary | ICD-10-CM | POA: Diagnosis not present

## 2017-01-23 DIAGNOSIS — J441 Chronic obstructive pulmonary disease with (acute) exacerbation: Secondary | ICD-10-CM | POA: Diagnosis not present

## 2017-01-23 DIAGNOSIS — F17210 Nicotine dependence, cigarettes, uncomplicated: Secondary | ICD-10-CM | POA: Diagnosis not present

## 2017-01-23 DIAGNOSIS — H3530 Unspecified macular degeneration: Secondary | ICD-10-CM | POA: Diagnosis not present

## 2017-01-23 DIAGNOSIS — I129 Hypertensive chronic kidney disease with stage 1 through stage 4 chronic kidney disease, or unspecified chronic kidney disease: Secondary | ICD-10-CM | POA: Diagnosis not present

## 2017-01-23 DIAGNOSIS — J09X2 Influenza due to identified novel influenza A virus with other respiratory manifestations: Secondary | ICD-10-CM | POA: Diagnosis not present

## 2017-01-23 DIAGNOSIS — I4891 Unspecified atrial fibrillation: Secondary | ICD-10-CM | POA: Diagnosis not present

## 2017-01-23 NOTE — Patient Outreach (Signed)
[facility]) Care Management  01/23/2017  KEDARIUS ALOISI 09/05/1941 [id number]  Transition of Care calls to be completed by primary care practice . Not eligible for [facility]  Transition of care program.  Plan: Close out. Send to care management assistant.  [name], RN BSN [place] Management Coordinator [facility] Care Management  [phone]

## 2017-01-24 DIAGNOSIS — J441 Chronic obstructive pulmonary disease with (acute) exacerbation: Secondary | ICD-10-CM | POA: Diagnosis not present

## 2017-01-24 DIAGNOSIS — I4891 Unspecified atrial fibrillation: Secondary | ICD-10-CM | POA: Diagnosis not present

## 2017-01-24 DIAGNOSIS — I129 Hypertensive chronic kidney disease with stage 1 through stage 4 chronic kidney disease, or unspecified chronic kidney disease: Secondary | ICD-10-CM | POA: Diagnosis not present

## 2017-01-24 DIAGNOSIS — H3530 Unspecified macular degeneration: Secondary | ICD-10-CM | POA: Diagnosis not present

## 2017-01-24 DIAGNOSIS — N183 Chronic kidney disease, stage 3 (moderate): Secondary | ICD-10-CM | POA: Diagnosis not present

## 2017-01-24 DIAGNOSIS — F17210 Nicotine dependence, cigarettes, uncomplicated: Secondary | ICD-10-CM | POA: Diagnosis not present

## 2017-01-24 DIAGNOSIS — F17290 Nicotine dependence, other tobacco product, uncomplicated: Secondary | ICD-10-CM | POA: Diagnosis not present

## 2017-01-24 DIAGNOSIS — J09X2 Influenza due to identified novel influenza A virus with other respiratory manifestations: Secondary | ICD-10-CM | POA: Diagnosis not present

## 2017-01-24 DIAGNOSIS — Z8546 Personal history of malignant neoplasm of prostate: Secondary | ICD-10-CM | POA: Diagnosis not present

## 2017-01-25 ENCOUNTER — Other Ambulatory Visit: Payer: Self-pay | Admitting: Family Medicine

## 2017-01-25 ENCOUNTER — Encounter: Payer: Medicare HMO | Admitting: Pharmacist

## 2017-01-25 DIAGNOSIS — J09X2 Influenza due to identified novel influenza A virus with other respiratory manifestations: Secondary | ICD-10-CM | POA: Diagnosis not present

## 2017-01-25 DIAGNOSIS — Z8546 Personal history of malignant neoplasm of prostate: Secondary | ICD-10-CM | POA: Diagnosis not present

## 2017-01-25 DIAGNOSIS — I4891 Unspecified atrial fibrillation: Secondary | ICD-10-CM | POA: Diagnosis not present

## 2017-01-25 DIAGNOSIS — F17290 Nicotine dependence, other tobacco product, uncomplicated: Secondary | ICD-10-CM | POA: Diagnosis not present

## 2017-01-25 DIAGNOSIS — H3530 Unspecified macular degeneration: Secondary | ICD-10-CM | POA: Diagnosis not present

## 2017-01-25 DIAGNOSIS — J441 Chronic obstructive pulmonary disease with (acute) exacerbation: Secondary | ICD-10-CM | POA: Diagnosis not present

## 2017-01-25 DIAGNOSIS — I129 Hypertensive chronic kidney disease with stage 1 through stage 4 chronic kidney disease, or unspecified chronic kidney disease: Secondary | ICD-10-CM | POA: Diagnosis not present

## 2017-01-25 DIAGNOSIS — N183 Chronic kidney disease, stage 3 (moderate): Secondary | ICD-10-CM | POA: Diagnosis not present

## 2017-01-25 DIAGNOSIS — F17210 Nicotine dependence, cigarettes, uncomplicated: Secondary | ICD-10-CM | POA: Diagnosis not present

## 2017-01-26 ENCOUNTER — Ambulatory Visit (INDEPENDENT_AMBULATORY_CARE_PROVIDER_SITE_OTHER): Payer: Medicare HMO | Admitting: Family

## 2017-01-26 ENCOUNTER — Encounter: Payer: Self-pay | Admitting: Family

## 2017-01-26 VITALS — BP 117/80 | HR 77 | Temp 97.6°F | Ht 69.0 in | Wt 167.8 lb

## 2017-01-26 DIAGNOSIS — J441 Chronic obstructive pulmonary disease with (acute) exacerbation: Secondary | ICD-10-CM | POA: Diagnosis not present

## 2017-01-26 DIAGNOSIS — J020 Streptococcal pharyngitis: Secondary | ICD-10-CM

## 2017-01-26 DIAGNOSIS — F17210 Nicotine dependence, cigarettes, uncomplicated: Secondary | ICD-10-CM | POA: Diagnosis not present

## 2017-01-26 DIAGNOSIS — I129 Hypertensive chronic kidney disease with stage 1 through stage 4 chronic kidney disease, or unspecified chronic kidney disease: Secondary | ICD-10-CM | POA: Diagnosis not present

## 2017-01-26 DIAGNOSIS — F17290 Nicotine dependence, other tobacco product, uncomplicated: Secondary | ICD-10-CM | POA: Diagnosis not present

## 2017-01-26 DIAGNOSIS — B370 Candidal stomatitis: Secondary | ICD-10-CM

## 2017-01-26 DIAGNOSIS — Z8546 Personal history of malignant neoplasm of prostate: Secondary | ICD-10-CM | POA: Diagnosis not present

## 2017-01-26 DIAGNOSIS — H3530 Unspecified macular degeneration: Secondary | ICD-10-CM | POA: Diagnosis not present

## 2017-01-26 DIAGNOSIS — I4891 Unspecified atrial fibrillation: Secondary | ICD-10-CM | POA: Diagnosis not present

## 2017-01-26 DIAGNOSIS — N183 Chronic kidney disease, stage 3 (moderate): Secondary | ICD-10-CM | POA: Diagnosis not present

## 2017-01-26 DIAGNOSIS — J09X2 Influenza due to identified novel influenza A virus with other respiratory manifestations: Secondary | ICD-10-CM | POA: Diagnosis not present

## 2017-01-26 MED ORDER — NYSTATIN 100000 UNIT/ML MT SUSP: 5.0000 mL | Freq: Four times a day (QID) | OROMUCOSAL | 2 refills | Status: DC

## 2017-01-26 MED ORDER — AMOXICILLIN-POT CLAVULANATE 875-125 MG PO TABS: 1.0000 | ORAL_TABLET | Freq: Two times a day (BID) | ORAL | 0 refills | Status: DC

## 2017-01-26 NOTE — Patient Instructions (Signed)
Strep Throat Strep throat is a bacterial infection of the throat. Your health care provider may call the infection tonsillitis or pharyngitis, depending on whether there is swelling in the tonsils or at the back of the throat. Strep throat is most common during the cold months of the year in children who are 5-78 years of age, but it can happen during any season in people of any age. This infection is spread from person to person (contagious) through coughing, sneezing, or close contact. What are the causes? Strep throat is caused by the bacteria called Streptococcus pyogenes. What increases the risk? This condition is more likely to develop in:  People who spend time in crowded places where the infection can spread easily.  People who have close contact with someone who has strep throat.  What are the signs or symptoms? Symptoms of this condition include:  Fever or chills.  Redness, swelling, or pain in the tonsils or throat.  Pain or difficulty when swallowing.  White or yellow spots on the tonsils or throat.  Swollen, tender glands in the neck or under the jaw.  Red rash all over the body (rare).  How is this diagnosed? This condition is diagnosed by performing a rapid strep test or by taking a swab of your throat (throat culture test). Results from a rapid strep test are usually ready in a few minutes, but throat culture test results are available after one or two days. How is this treated? This condition is treated with antibiotic medicine. Follow these instructions at home: Medicines  Take over-the-counter and prescription medicines only as told by your health care provider.  Take your antibiotic as told by your health care provider. Do not stop taking the antibiotic even if you start to feel better.  Have family members who also have a sore throat or fever tested for strep throat. They may need antibiotics if they have the strep infection. Eating and drinking  Do not  share food, drinking cups, or personal items that could cause the infection to spread to other people.  If swallowing is difficult, try eating soft foods until your sore throat feels better.  Drink enough fluid to keep your urine clear or pale yellow. General instructions  Gargle with a salt-water mixture 3-4 times per day or as needed. To make a salt-water mixture, completely dissolve -1 tsp of salt in 1 cup of warm water.  Make sure that all household members wash their hands well.  Get plenty of rest.  Stay home from school or work until you have been taking antibiotics for 24 hours.  Keep all follow-up visits as told by your health care provider. This is important. Contact a health care provider if:  The glands in your neck continue to get bigger.  You develop a rash, cough, or earache.  You cough up a thick liquid that is green, yellow-brown, or bloody.  You have pain or discomfort that does not get better with medicine.  Your problems seem to be getting worse rather than better.  You have a fever. Get help right away if:  You have new symptoms, such as vomiting, severe headache, stiff or painful neck, chest pain, or shortness of breath.  You have severe throat pain, drooling, or changes in your voice.  You have swelling of the neck, or the skin on the neck becomes red and tender.  You have signs of dehydration, such as fatigue, dry mouth, and decreased urination.  You become increasingly sleepy, or   you cannot wake up completely.  Your joints become red or painful. This information is not intended to replace advice given to you by your health care provider. Make sure you discuss any questions you have with your health care provider. Document Released: 10/27/2000 Document Revised: 06/28/2016 Document Reviewed: 02/22/2015 Elsevier Interactive Patient Education  2017 [employer] Inc[employer]  

## 2017-01-26 NOTE — Progress Notes (Signed)
   Subjective:    Patient ID: Don [name], male    DOB: 06/28/1941, 76 y.o.   MRN: [id number]  Sore Throat   This is a new problem. The current episode started yesterday. The problem has been gradually worsening. There has been no fever. The pain is at a severity of 5/10. The pain is moderate. Associated symptoms include congestion, coughing, headaches, a hoarse voice, a plugged ear sensation, shortness of breath, swollen glands and trouble swallowing. Pertinent negatives include no ear pain. He has tried acetaminophen and gargles for the symptoms. The treatment provided mild relief.      Review of Systems  HENT: Positive for congestion, hoarse voice and trouble swallowing. Negative for ear pain.   Respiratory: Positive for cough and shortness of breath.   Neurological: Positive for headaches.  All other systems reviewed and are negative.      Objective:   Physical Exam  Constitutional: He is oriented to person, place, and time. He appears well-developed and well-nourished. No distress.  HENT:  Head: Normocephalic.  Right Ear: External ear normal.  Left Ear: External ear normal.  Nose: Mucosal edema and rhinorrhea present.  Mouth/Throat: Oropharyngeal exudate, posterior oropharyngeal edema and posterior oropharyngeal erythema present.  White coating on tongue   Eyes: Pupils are equal, round, and reactive to light. Right eye exhibits no discharge. Left eye exhibits no discharge.  Neck: Normal range of motion. Neck supple. No thyromegaly present.  Cardiovascular: Normal rate, regular rhythm, normal heart sounds and intact distal pulses.   No murmur heard. Pulmonary/Chest: Effort normal and breath sounds normal. No respiratory distress. He has no wheezes.  Abdominal: Soft. Bowel sounds are normal. He exhibits no distension. There is no tenderness.  Musculoskeletal: Normal range of motion. He exhibits no edema or tenderness.  Neurological: He is alert and oriented to person, place,  and time.  Skin: Skin is warm and dry. No rash noted. No erythema.  Psychiatric: He has a normal mood and affect. His behavior is normal. Judgment and thought content normal.  Vitals reviewed.     BP 117/80   Pulse 77   Temp 97.6 F (36.4 C) (Oral)   Ht '5\' 9"'$  (1.753 m)   Wt 167 lb 12.8 oz (76.1 kg)   BMI 24.78 kg/m      Assessment & Plan:  1. Strep throat - Take meds as prescribed - Use a cool mist humidifier  -Use saline nose sprays frequently -Saline irrigations of the nose can be very helpful if done frequently.  * 4X daily for 1 week*  * Use of a nettie pot can be helpful with this. Follow directions with this* -Force fluids -For any cough or congestion  Use plain Mucinex- regular strength or max strength is fine   * Children- consult with Pharmacist for dosing -For fever or aces or pains- take tylenol or ibuprofen appropriate for age and weight.  * for fevers greater than 101 orally you may alternate ibuprofen and tylenol every  3 hours. -Throat lozenges if help -New toothbrush in 3 days - amoxicillin-clavulanate (AUGMENTIN) 875-125 MG tablet; Take 1 tablet by mouth 2 (two) times daily.  Dispense: 14 tablet; Refill: 0  2. Oral thrush Keep clean and dry - nystatin (MYCOSTATIN) 100000 UNIT/ML suspension; Take 5 mLs (500,000 Units total) by mouth 4 (four) times daily.  Dispense: 473 mL; Refill: [place], FNP

## 2017-01-26 NOTE — Telephone Encounter (Signed)
Last filled 12/15/16, last seen 10/17. Has appt in April. Call in please

## 2017-01-29 DIAGNOSIS — Z8546 Personal history of malignant neoplasm of prostate: Secondary | ICD-10-CM | POA: Diagnosis not present

## 2017-01-29 DIAGNOSIS — I129 Hypertensive chronic kidney disease with stage 1 through stage 4 chronic kidney disease, or unspecified chronic kidney disease: Secondary | ICD-10-CM | POA: Diagnosis not present

## 2017-01-29 DIAGNOSIS — H3530 Unspecified macular degeneration: Secondary | ICD-10-CM | POA: Diagnosis not present

## 2017-01-29 DIAGNOSIS — F17290 Nicotine dependence, other tobacco product, uncomplicated: Secondary | ICD-10-CM | POA: Diagnosis not present

## 2017-01-29 DIAGNOSIS — I4891 Unspecified atrial fibrillation: Secondary | ICD-10-CM | POA: Diagnosis not present

## 2017-01-29 DIAGNOSIS — J09X2 Influenza due to identified novel influenza A virus with other respiratory manifestations: Secondary | ICD-10-CM | POA: Diagnosis not present

## 2017-01-29 DIAGNOSIS — J441 Chronic obstructive pulmonary disease with (acute) exacerbation: Secondary | ICD-10-CM | POA: Diagnosis not present

## 2017-01-29 DIAGNOSIS — N183 Chronic kidney disease, stage 3 (moderate): Secondary | ICD-10-CM | POA: Diagnosis not present

## 2017-01-29 DIAGNOSIS — F17210 Nicotine dependence, cigarettes, uncomplicated: Secondary | ICD-10-CM | POA: Diagnosis not present

## 2017-01-30 ENCOUNTER — Encounter: Payer: Medicare HMO | Admitting: Pharmacist

## 2017-01-30 DIAGNOSIS — I129 Hypertensive chronic kidney disease with stage 1 through stage 4 chronic kidney disease, or unspecified chronic kidney disease: Secondary | ICD-10-CM | POA: Diagnosis not present

## 2017-01-30 DIAGNOSIS — F17290 Nicotine dependence, other tobacco product, uncomplicated: Secondary | ICD-10-CM | POA: Diagnosis not present

## 2017-01-30 DIAGNOSIS — Z8546 Personal history of malignant neoplasm of prostate: Secondary | ICD-10-CM | POA: Diagnosis not present

## 2017-01-30 DIAGNOSIS — J441 Chronic obstructive pulmonary disease with (acute) exacerbation: Secondary | ICD-10-CM | POA: Diagnosis not present

## 2017-01-30 DIAGNOSIS — H3530 Unspecified macular degeneration: Secondary | ICD-10-CM | POA: Diagnosis not present

## 2017-01-30 DIAGNOSIS — N183 Chronic kidney disease, stage 3 (moderate): Secondary | ICD-10-CM | POA: Diagnosis not present

## 2017-01-30 DIAGNOSIS — J09X2 Influenza due to identified novel influenza A virus with other respiratory manifestations: Secondary | ICD-10-CM | POA: Diagnosis not present

## 2017-01-30 DIAGNOSIS — F17210 Nicotine dependence, cigarettes, uncomplicated: Secondary | ICD-10-CM | POA: Diagnosis not present

## 2017-01-30 DIAGNOSIS — I4891 Unspecified atrial fibrillation: Secondary | ICD-10-CM | POA: Diagnosis not present

## 2017-01-31 ENCOUNTER — Inpatient Hospital Stay (HOSPITAL_COMMUNITY)
Admission: EM | Admit: 2017-01-31 | Discharge: 2017-02-03 | DRG: 378 | Disposition: A | Discharge status: Home Health | Payer: Medicare HMO | Attending: Family Medicine | Admitting: Family Medicine

## 2017-01-31 ENCOUNTER — Encounter: Payer: Self-pay | Admitting: Family Medicine

## 2017-01-31 ENCOUNTER — Ambulatory Visit (INDEPENDENT_AMBULATORY_CARE_PROVIDER_SITE_OTHER): Payer: Medicare HMO | Admitting: Pharmacist

## 2017-01-31 ENCOUNTER — Emergency Department (HOSPITAL_COMMUNITY): Payer: Medicare HMO

## 2017-01-31 ENCOUNTER — Encounter (HOSPITAL_COMMUNITY): Payer: Self-pay | Admitting: *Deleted

## 2017-01-31 DIAGNOSIS — K922 Gastrointestinal hemorrhage, unspecified: Principal | ICD-10-CM | POA: Diagnosis present

## 2017-01-31 DIAGNOSIS — N390 Urinary tract infection, site not specified: Secondary | ICD-10-CM | POA: Diagnosis present

## 2017-01-31 DIAGNOSIS — J09X2 Influenza due to identified novel influenza A virus with other respiratory manifestations: Secondary | ICD-10-CM | POA: Diagnosis not present

## 2017-01-31 DIAGNOSIS — E785 Hyperlipidemia, unspecified: Secondary | ICD-10-CM | POA: Diagnosis present

## 2017-01-31 DIAGNOSIS — H3530 Unspecified macular degeneration: Secondary | ICD-10-CM | POA: Diagnosis present

## 2017-01-31 DIAGNOSIS — Z87891 Personal history of nicotine dependence: Secondary | ICD-10-CM

## 2017-01-31 DIAGNOSIS — R791 Abnormal coagulation profile: Secondary | ICD-10-CM

## 2017-01-31 DIAGNOSIS — K319 Disease of stomach and duodenum, unspecified: Secondary | ICD-10-CM | POA: Diagnosis not present

## 2017-01-31 DIAGNOSIS — F419 Anxiety disorder, unspecified: Secondary | ICD-10-CM | POA: Diagnosis present

## 2017-01-31 DIAGNOSIS — D689 Coagulation defect, unspecified: Secondary | ICD-10-CM | POA: Diagnosis present

## 2017-01-31 DIAGNOSIS — R05 Cough: Secondary | ICD-10-CM | POA: Diagnosis not present

## 2017-01-31 DIAGNOSIS — I480 Paroxysmal atrial fibrillation: Secondary | ICD-10-CM | POA: Diagnosis present

## 2017-01-31 DIAGNOSIS — Z7901 Long term (current) use of anticoagulants: Secondary | ICD-10-CM

## 2017-01-31 DIAGNOSIS — K5710 Diverticulosis of small intestine without perforation or abscess without bleeding: Secondary | ICD-10-CM | POA: Diagnosis present

## 2017-01-31 DIAGNOSIS — D62 Acute posthemorrhagic anemia: Secondary | ICD-10-CM | POA: Diagnosis present

## 2017-01-31 DIAGNOSIS — K210 Gastro-esophageal reflux disease with esophagitis: Secondary | ICD-10-CM | POA: Diagnosis present

## 2017-01-31 DIAGNOSIS — F17290 Nicotine dependence, other tobacco product, uncomplicated: Secondary | ICD-10-CM | POA: Diagnosis not present

## 2017-01-31 DIAGNOSIS — I129 Hypertensive chronic kidney disease with stage 1 through stage 4 chronic kidney disease, or unspecified chronic kidney disease: Secondary | ICD-10-CM | POA: Diagnosis present

## 2017-01-31 DIAGNOSIS — Z888 Allergy status to other drugs, medicaments and biological substances status: Secondary | ICD-10-CM

## 2017-01-31 DIAGNOSIS — J449 Chronic obstructive pulmonary disease, unspecified: Secondary | ICD-10-CM | POA: Diagnosis present

## 2017-01-31 DIAGNOSIS — Z66 Do not resuscitate: Secondary | ICD-10-CM | POA: Diagnosis present

## 2017-01-31 DIAGNOSIS — K2970 Gastritis, unspecified, without bleeding: Secondary | ICD-10-CM | POA: Diagnosis present

## 2017-01-31 DIAGNOSIS — Z7951 Long term (current) use of inhaled steroids: Secondary | ICD-10-CM

## 2017-01-31 DIAGNOSIS — J441 Chronic obstructive pulmonary disease with (acute) exacerbation: Secondary | ICD-10-CM | POA: Diagnosis not present

## 2017-01-31 DIAGNOSIS — N183 Chronic kidney disease, stage 3 (moderate): Secondary | ICD-10-CM | POA: Diagnosis present

## 2017-01-31 DIAGNOSIS — D61818 Other pancytopenia: Secondary | ICD-10-CM | POA: Diagnosis present

## 2017-01-31 DIAGNOSIS — D649 Anemia, unspecified: Secondary | ICD-10-CM | POA: Diagnosis not present

## 2017-01-31 DIAGNOSIS — R3129 Other microscopic hematuria: Secondary | ICD-10-CM | POA: Diagnosis present

## 2017-01-31 DIAGNOSIS — K222 Esophageal obstruction: Secondary | ICD-10-CM | POA: Diagnosis present

## 2017-01-31 DIAGNOSIS — I4891 Unspecified atrial fibrillation: Secondary | ICD-10-CM | POA: Diagnosis present

## 2017-01-31 DIAGNOSIS — F17210 Nicotine dependence, cigarettes, uncomplicated: Secondary | ICD-10-CM | POA: Diagnosis not present

## 2017-01-31 DIAGNOSIS — Z8546 Personal history of malignant neoplasm of prostate: Secondary | ICD-10-CM | POA: Diagnosis not present

## 2017-01-31 DIAGNOSIS — I10 Essential (primary) hypertension: Secondary | ICD-10-CM | POA: Diagnosis present

## 2017-01-31 LAB — CBC
HEMATOCRIT: 29 % — AB (ref 39.0–52.0)
Hemoglobin: 10 g/dL — ABNORMAL LOW (ref 13.0–17.0)
MCH: 33.8 pg (ref 26.0–34.0)
MCHC: 34.5 g/dL (ref 30.0–36.0)
MCV: 98 fL (ref 78.0–100.0)
Platelets: 91 10*3/uL — ABNORMAL LOW (ref 150–400)
RBC: 2.96 MIL/uL — ABNORMAL LOW (ref 4.22–5.81)
RDW: 14 % (ref 11.5–15.5)
WBC: 2.1 10*3/uL — AB (ref 4.0–10.5)

## 2017-01-31 LAB — COMPREHENSIVE METABOLIC PANEL
ALBUMIN: 2.5 g/dL — AB (ref 3.5–5.0)
ALT: 46 U/L (ref 17–63)
AST: 26 U/L (ref 15–41)
Alkaline Phosphatase: 91 U/L (ref 38–126)
Anion gap: 6 (ref 5–15)
BUN: 41 mg/dL — AB (ref 6–20)
CHLORIDE: 103 mmol/L (ref 101–111)
CO2: 26 mmol/L (ref 22–32)
Calcium: 7.7 mg/dL — ABNORMAL LOW (ref 8.9–10.3)
Creatinine, Ser: 2.5 mg/dL — ABNORMAL HIGH (ref 0.61–1.24)
GFR calc Af Amer: 27 mL/min — ABNORMAL LOW (ref >60)
GFR, EST NON AFRICAN AMERICAN: 23 mL/min — AB (ref >60)
GLUCOSE: 192 mg/dL — AB (ref 65–99)
POTASSIUM: 4.5 mmol/L (ref 3.5–5.1)
Sodium: 135 mmol/L (ref 135–145)
Total Bilirubin: 0.7 mg/dL (ref 0.3–1.2)
Total Protein: 5.3 g/dL — ABNORMAL LOW (ref 6.5–8.1)

## 2017-01-31 LAB — URINALYSIS, ROUTINE W REFLEX MICROSCOPIC
BACTERIA UA: NONE SEEN
BILIRUBIN URINE: NEGATIVE
Glucose, UA: NEGATIVE mg/dL
KETONES UR: NEGATIVE mg/dL
Leukocytes, UA: NEGATIVE
NITRITE: NEGATIVE
Protein, ur: 100 mg/dL — AB
Specific Gravity, Urine: 1.017 (ref 1.005–1.030)
pH: 6 (ref 5.0–8.0)

## 2017-01-31 LAB — PROTIME-INR
INR: 4.9 — AB
Prothrombin Time: 47.1 seconds — ABNORMAL HIGH (ref 11.4–15.2)

## 2017-01-31 LAB — FINGERSTICK HEMOGLOBIN: Hemoglobin: 8.3 g/dL — ABNORMAL LOW (ref 12.6–17.7)

## 2017-01-31 LAB — TYPE AND SCREEN
ABO/RH(D): A POS
Antibody Screen: NEGATIVE

## 2017-01-31 LAB — COAGUCHEK XS/INR WAIVED
INR: 6.4 (ref 0.9–1.1)
PROTHROMBIN TIME: 76.4 s

## 2017-01-31 LAB — HEMATOCRIT: HEMATOCRIT: 26.2 % — AB (ref 39.0–52.0)

## 2017-01-31 LAB — HEMOGLOBIN: Hemoglobin: 9.3 g/dL — ABNORMAL LOW (ref 13.0–17.0)

## 2017-01-31 MED ORDER — DEXTROSE-NACL 5-0.9 % IV SOLN
INTRAVENOUS | Status: DC
  Administered 2017-01-31 – 2017-02-02 (×3): via INTRAVENOUS

## 2017-01-31 MED ORDER — DILTIAZEM HCL ER COATED BEADS 180 MG PO CP24
180.0000 mg | ORAL_CAPSULE | Freq: Two times a day (BID) | ORAL | Status: DC
  Administered 2017-01-31 – 2017-02-03 (×5): 180 mg via ORAL
  Filled 2017-01-31 (×5): qty 1

## 2017-01-31 MED ORDER — POLYETHYL GLYCOL-PROPYL GLYCOL 0.4-0.3 % OP GEL: Freq: Two times a day (BID) | OPHTHALMIC | Status: DC | PRN

## 2017-01-31 MED ORDER — ALBUTEROL SULFATE HFA 108 (90 BASE) MCG/ACT IN AERS: 2.0000 | INHALATION_SPRAY | Freq: Four times a day (QID) | RESPIRATORY_TRACT | Status: DC | PRN

## 2017-01-31 MED ORDER — MOMETASONE FURO-FORMOTEROL FUM 200-5 MCG/ACT IN AERO
2.0000 | INHALATION_SPRAY | Freq: Two times a day (BID) | RESPIRATORY_TRACT | Status: DC
  Administered 2017-02-01 – 2017-02-03 (×5): 2 via RESPIRATORY_TRACT
  Filled 2017-01-31: qty 8.8

## 2017-01-31 MED ORDER — OMEGA-3-ACID ETHYL ESTERS 1 G PO CAPS
1.0000 g | ORAL_CAPSULE | Freq: Every day | ORAL | Status: DC
  Administered 2017-01-31 – 2017-02-03 (×3): 1 g via ORAL
  Filled 2017-01-31 (×3): qty 1

## 2017-01-31 MED ORDER — ONDANSETRON HCL 4 MG PO TABS: 4.0000 mg | ORAL_TABLET | Freq: Four times a day (QID) | ORAL | Status: DC | PRN

## 2017-01-31 MED ORDER — ATORVASTATIN CALCIUM 20 MG PO TABS
20.0000 mg | ORAL_TABLET | Freq: Every day | ORAL | Status: DC
  Administered 2017-01-31 – 2017-02-03 (×3): 20 mg via ORAL
  Filled 2017-01-31 (×3): qty 1

## 2017-01-31 MED ORDER — ACETAMINOPHEN 650 MG RE SUPP: 650.0000 mg | Freq: Four times a day (QID) | RECTAL | Status: DC | PRN

## 2017-01-31 MED ORDER — SODIUM CHLORIDE 0.9 % IV BOLUS (SEPSIS)
500.0000 mL | Freq: Once | INTRAVENOUS | Status: AC
  Administered 2017-01-31: 500 mL via INTRAVENOUS

## 2017-01-31 MED ORDER — PRO-BIOTIC BLEND PO CAPS: 1.0000 | ORAL_CAPSULE | Freq: Every day | ORAL | Status: DC

## 2017-01-31 MED ORDER — ACETAMINOPHEN 325 MG PO TABS
650.0000 mg | ORAL_TABLET | Freq: Four times a day (QID) | ORAL | Status: DC | PRN
  Administered 2017-01-31 – 2017-02-01 (×2): 650 mg via ORAL
  Filled 2017-01-31 (×2): qty 2

## 2017-01-31 MED ORDER — DULOXETINE HCL 60 MG PO CPEP
60.0000 mg | ORAL_CAPSULE | Freq: Every day | ORAL | Status: DC
  Administered 2017-02-01 – 2017-02-03 (×2): 60 mg via ORAL
  Filled 2017-01-31 (×2): qty 1

## 2017-01-31 MED ORDER — CLONAZEPAM 0.5 MG PO TABS
0.2500 mg | ORAL_TABLET | Freq: Two times a day (BID) | ORAL | Status: DC | PRN
  Administered 2017-02-02 – 2017-02-03 (×2): 0.25 mg via ORAL
  Filled 2017-01-31 (×2): qty 1

## 2017-01-31 MED ORDER — BISOPROLOL FUMARATE 5 MG PO TABS
10.0000 mg | ORAL_TABLET | Freq: Two times a day (BID) | ORAL | Status: DC
  Administered 2017-02-01 – 2017-02-03 (×4): 10 mg via ORAL
  Filled 2017-01-31: qty 2
  Filled 2017-01-31: qty 1
  Filled 2017-01-31 (×5): qty 2
  Filled 2017-01-31: qty 1
  Filled 2017-01-31 (×2): qty 2
  Filled 2017-01-31: qty 1

## 2017-01-31 MED ORDER — SODIUM CHLORIDE 0.9% FLUSH
3.0000 mL | Freq: Two times a day (BID) | INTRAVENOUS | Status: DC
  Administered 2017-01-31 – 2017-02-03 (×4): 3 mL via INTRAVENOUS

## 2017-01-31 MED ORDER — DEXTROSE 5 % IV SOLN
1.0000 g | INTRAVENOUS | Status: DC
  Administered 2017-02-01 – 2017-02-02 (×3): 1 g via INTRAVENOUS
  Filled 2017-01-31 (×5): qty 10

## 2017-01-31 MED ORDER — POLYVINYL ALCOHOL 1.4 % OP SOLN
1.0000 [drp] | OPHTHALMIC | Status: DC | PRN
  Filled 2017-01-31: qty 15

## 2017-01-31 MED ORDER — TIOTROPIUM BROMIDE MONOHYDRATE 18 MCG IN CAPS
18.0000 ug | ORAL_CAPSULE | Freq: Every day | RESPIRATORY_TRACT | Status: DC
  Administered 2017-02-01 – 2017-02-03 (×3): 18 ug via RESPIRATORY_TRACT
  Filled 2017-01-31: qty 5

## 2017-01-31 MED ORDER — ONDANSETRON HCL 4 MG/2ML IJ SOLN: 4.0000 mg | Freq: Four times a day (QID) | INTRAMUSCULAR | Status: DC | PRN

## 2017-01-31 MED ORDER — PANTOPRAZOLE SODIUM 40 MG IV SOLR
40.0000 mg | Freq: Two times a day (BID) | INTRAVENOUS | Status: DC
  Administered 2017-01-31 – 2017-02-02 (×4): 40 mg via INTRAVENOUS
  Filled 2017-01-31 (×4): qty 40

## 2017-01-31 MED ORDER — ALBUTEROL SULFATE (2.5 MG/3ML) 0.083% IN NEBU
3.0000 mL | INHALATION_SOLUTION | Freq: Four times a day (QID) | RESPIRATORY_TRACT | Status: DC | PRN
  Administered 2017-02-01: 3 mL via RESPIRATORY_TRACT
  Filled 2017-01-31: qty 3

## 2017-01-31 MED ORDER — VITAMIN K1 10 MG/ML IJ SOLN
5.0000 mg | Freq: Once | INTRAMUSCULAR | Status: AC
  Administered 2017-01-31: 5 mg via SUBCUTANEOUS
  Filled 2017-01-31: qty 1

## 2017-01-31 MED ORDER — DEXTROSE 5 % IV SOLN
1.0000 g | Freq: Once | INTRAVENOUS | Status: AC
  Administered 2017-01-31: 1 g via INTRAVENOUS
  Filled 2017-01-31: qty 10

## 2017-01-31 NOTE — ED Notes (Signed)
CRITICAL VALUE ALERT  Critical value received:  INR 4.90  Date of notification:  01/31/2017  Time of notification: 1027    Nurse who received alert:  Susa Day  MD notified (1st page): Dr. Lacinda Axon

## 2017-01-31 NOTE — H&P (Addendum)
History and Physical    Don [name] WUJ:[id number] DOB: June 26, 1941 DOA: 01/31/2017  PCP: Wardell Honour, MD  Patient coming from: Home.    Chief Complaint:   Black stool, elevated INR, and anemia.   HPI: Don [name] is an 76 y.o. male with hx of afib on chronic anticoagulation with Coumadin, hx of CKD III, anxiety, COPD, prior tobacco use, hx of prostate cancer, presented to the ER for black stool, and Hb of 8g per dL, and INR of 6 at the PCP's office.  He has been on iron supplement, and his stool guaic in the ER was negative.  He was given prednisone recently, and had taken ASA and advil per his account, along with being given augmentin recently.  In the ER, repeat Hb was 10 grams per dL, and repeat INR was 6.  His baseline Hb was 13 g per dL>  His platelet count was 91K,  And his WBC was 2.1K.  His UA was found to have microscopic hematuria, along with pyuria of 6-30 WBCs.  He was given IV rocephin.  He had colonsocpy twice, he said, and it was negative.  This was at the [place].  He said it was planned for him to have EGD.  Hospitalist was asked to admit him for UGIB, coagulopathy, UTI, and anemia. He denied current alcohol or tobacco use.   ED Course:  See above.  Rewiew of Systems:  Constitutional: Negative for malaise, fever and chills. No significant weight loss or weight gain Eyes: Negative for eye pain, redness and discharge, diplopia, visual changes, or flashes of light. ENMT: Negative for ear pain, hoarseness, nasal congestion, sinus pressure and sore throat. No headaches; tinnitus, drooling, or problem swallowing. Cardiovascular: Negative for chest pain, palpitations, diaphoresis, dyspnea and peripheral edema. ; No orthopnea, PND Respiratory: Negative for cough, hemoptysis, wheezing and stridor. No pleuritic chestpain. Gastrointestinal: Negative for diarrhea, constipation,  melena, blood in stool, hematemesis, jaundice and rectal bleeding.    Genitourinary: Negative for  frequency, dysuria, incontinence,flank pain and hematuria; Musculoskeletal: Negative for back pain and neck pain. Negative for swelling and trauma.;  Skin: . Negative for pruritus, rash, abrasions, bruising and skin lesion.; ulcerations Neuro: Negative for headache, lightheadedness and neck stiffness. Negative for weakness, altered level of consciousness , altered mental status, extremity weakness, burning feet, involuntary movement, seizure and syncope.  Psych: negative for anxiety, depression, insomnia, tearfulness, panic attacks, hallucinations, paranoia, suicidal or homicidal ideation    Past Medical History:  Diagnosis Date  . Acute bronchitis 04/03/2014  . Anxiety   . Atrial fibrillation ([place])   . Cataract   . COPD (chronic obstructive pulmonary disease) ([place])   . Essential hypertension   . Hyperlipidemia   . Macular degeneration   . Noncompliance with medications 04/2013   Xarelto, digoxin previously  . Pre-diabetes   . Prostate cancer ([place])   . Stage III chronic kidney disease 04/03/2014  . Tubular adenoma of colon 07/31/02, 11/18/03    Past Surgical History:  Procedure Laterality Date  . Anal abcess,Hemorroids,    . COLONOSCOPY N/A 04/05/2014   Procedure: COLONOSCOPY;  Surgeon: Ladene Artist, MD;  Location: [facility];  Service: Endoscopy;  Laterality: N/A;  . COLONOSCOPY W/ BIOPSIES  11/18/2003   Dr. Earle Gell  . ESOPHAGOGASTRODUODENOSCOPY  11/18/2003   Dr. Earle Gell  . ESOPHAGOGASTRODUODENOSCOPY N/A 04/05/2014   Procedure: ESOPHAGOGASTRODUODENOSCOPY (EGD);  Surgeon: Ladene Artist, MD;  Location: [facility];  Service: Endoscopy;  Laterality: N/A;  . RETROPUBIC  PROSTATECTOMY  11/26/2001  . TONSILLECTOMY       reports that he quit smoking about 4 weeks ago. His smoking use included Cigarettes and Cigars. He has a 62.00 pack-year smoking history. He has never used smokeless tobacco. He reports that he does not drink alcohol or use drugs.  Allergies    Allergen Reactions  . Wellbutrin [Bupropion] Anxiety    Family History  Problem Relation Age of Onset  . Diabetes Father   . Heart disease Father     MI  . Heart attack Father   . Heart attack Mother   . Heart disease Brother   . Cancer Brother     lung  . Lung cancer Brother   . Heart disease Brother   . Lung cancer Sister   . Cancer Sister     lung  . Heart disease Brother   . Heart disease Brother      Prior to Admission medications   Medication Sig Start Date End Date Taking? Authorizing Provider  albuterol (PROVENTIL HFA;VENTOLIN HFA) 108 (90 Base) MCG/ACT inhaler Inhale 2 puffs into the lungs every 6 (six) hours as needed for wheezing or shortness of breath. 01/18/17  Yes Eber Jones, MD  ALLERGY RELIEF 10 MG tablet TAKE 1 TABLET DAILY FOR ALLERGY Patient taking differently: TAKE 1 TABLET DAILY FOR ALLERGY,    Loratidine 01/04/17  Yes Timmothy Euler, MD  amoxicillin-clavulanate (AUGMENTIN) 875-125 MG tablet Take 1 tablet by mouth 2 (two) times daily. 01/26/17  Yes Sharion Balloon, FNP  atorvastatin (LIPITOR) 20 MG tablet TAKE 1 TABLET DAILY 12/15/16  Yes Wardell Honour, MD  bisoprolol (ZEBETA) 10 MG tablet Take 1 tablet (10 mg total) by mouth 2 (two) times daily. 01/18/17  Yes Eber Jones, MD  budesonide-formoterol [facility]) 160-4.5 MCG/ACT inhaler Inhale 2 puffs into the lungs 2 (two) times daily. 02/10/16  Yes Wardell Honour, MD  clonazePAM (KLONOPIN) 1 MG tablet TAKE 1/2 TABLET ONCE DAILY AS NEEDED FOR ANXIETY 01/26/17  Yes Wardell Honour, MD  diltiazem (CARDIZEM CD) 180 MG 24 hr capsule Take 1 capsule (180 mg total) by mouth 2 (two) times daily. 01/18/17  Yes Eber Jones, MD  doxylamine, Sleep, (UNISOM) 25 MG tablet Take 25 mg by mouth at bedtime as needed.   Yes Historical Provider, MD  DULoxetine (CYMBALTA) 60 MG capsule TAKE (1) CAPSULE DAILY 11/20/16  Yes Terald Sleeper, PA-C  FERREX 150 150 MG capsule Take 1 capsule by mouth daily. 07/08/15   Yes Historical Provider, MD  Incontinence Supply Disposable (INCONTINENCE BRIEF LARGE) MISC Use as needed for urinary incontinence.  Dx: urinary incontinence R32 and prostate cancer C61 09/19/16  Yes Wardell Honour, MD  Multiple Vitamin (MULTIVITAMIN WITH MINERALS) TABS Take 1 tablet by mouth daily.   Yes Historical Provider, MD  nystatin (MYCOSTATIN) 100000 UNIT/ML suspension Take 5 mLs (500,000 Units total) by mouth 4 (four) times daily. 01/26/17  Yes Sharion Balloon, FNP  omega-3 acid ethyl esters (LOVAZA) 1 G capsule TAKE (2) CAPSULES TWICE DAILY. 07/28/15  Yes Wardell Honour, MD  pantoprazole (PROTONIX) 40 MG tablet TAKE 1 TABLET DAILY 12/02/16  Yes Chipper Herb, MD  Polyethyl Glycol-Propyl Glycol (SYSTANE OP) Place 1 drop into both eyes 2 (two) times daily as needed (dry eyes).    Yes Historical Provider, MD  polyethylene glycol (MIRALAX / GLYCOLAX) packet Take 17 g by mouth 2 (two) times daily. 01/18/17  Yes Eber Jones, MD  predniSONE (DELTASONE) 20 MG tablet 2 tabs PO X4 days then 1.5tabs PO daily x4 days then 1tab PO daily x4 days then 0.5tabs PO daily x4 days 01/18/17  Yes Eber Jones, MD  Probiotic Product (PRO-BIOTIC BLEND) CAPS Take 1 capsule by mouth daily. 07/23/15  Yes Cherre Robins, PharmD  terazosin (HYTRIN) 2 MG capsule TAKE 1 CAPSULE AT BEDTIME 09/20/16  Yes Wardell Honour, MD  tiotropium (SPIRIVA) 18 MCG inhalation capsule Place 1 capsule (18 mcg total) into inhaler and inhale daily. 01/18/17  Yes Eber Jones, MD  triamcinolone cream (KENALOG) 0.1 % Apply 1 application topically 2 (two) times daily. 11/10/16  Yes Cherre Robins, PharmD  warfarin (COUMADIN) 4 MG tablet Take 1 and 1/2 tablets (= '6mg'$ ) daily except on Sundays and Thursdays take 1 tablet (='4mg'$ ) 01/04/17  Yes Wardell Honour, MD  nitroGLYCERIN (NITROSTAT) 0.4 MG SL tablet Place 1 tablet (0.4 mg total) under the tongue every 5 (five) minutes as needed for chest pain. Patient not taking: Reported on  01/26/2017 02/10/16   Wardell Honour, MD    Physical Exam: Vitals:   01/31/17 1730 01/31/17 1800 01/31/17 1830 01/31/17 1900  BP: 128/76 113/75 134/79 (!) 142/82  Pulse: 67     Resp: 18 20 (!) 24 19  Temp:      TempSrc:      SpO2: 98%     Weight:      Height:          Constitutional: NAD, calm, comfortable Vitals:   01/31/17 1730 01/31/17 1800 01/31/17 1830 01/31/17 1900  BP: 128/76 113/75 134/79 (!) 142/82  Pulse: 67     Resp: 18 20 (!) 24 19  Temp:      TempSrc:      SpO2: 98%     Weight:      Height:       Eyes: PERRL, lids and conjunctivae normal ENMT: Mucous membranes are moist. Posterior pharynx clear of any exudate or lesions.Normal dentition.  Neck: normal, supple, no masses, no thyromegaly Respiratory: clear to auscultation bilaterally, no wheezing, no crackles. Normal respiratory effort. No accessory muscle use.  Cardiovascular: Regular rate and rhythm, no murmurs / rubs / gallops. No extremity edema. 2+ pedal pulses. No carotid bruits.  Abdomen: no tenderness, no masses palpated. No hepatosplenomegaly. Bowel sounds positive.  Musculoskeletal: no clubbing / cyanosis. No joint deformity upper and lower extremities. Good ROM, no contractures. Normal muscle tone.  Skin: no rashes, lesions, ulcers. No induration Neurologic: CN 2-12 grossly intact. Sensation intact, DTR normal. Strength 5/5 in all 4.  Psychiatric: Normal judgment and insight. Alert and oriented x 3. Normal mood.     Labs on Admission: I have personally reviewed following labs and imaging studies  CBC:  Recent Labs Lab 01/31/17 1621  WBC 2.1*  HGB 10.0*  HCT 29.0*  MCV 98.0  PLT 91*   Basic Metabolic Panel:  Recent Labs Lab 01/31/17 1621  NA 135  K 4.5  CL 103  CO2 26  GLUCOSE 192*  BUN 41*  CREATININE 2.50*  CALCIUM 7.7*   GFR: Estimated Creatinine Clearance: 24.7 mL/min (A) (by C-G formula based on SCr of 2.5 mg/dL (H)). Liver Function Tests:  Recent Labs Lab  01/31/17 1621  AST 26  ALT 46  ALKPHOS 91  BILITOT 0.7  PROT 5.3*  ALBUMIN 2.5*   Coagulation Profile:  Recent Labs Lab 01/31/17 1431 01/31/17 1636  INR 6.4* 4.90*   Urine analysis:    Component  Value Date/Time   COLORURINE YELLOW 01/31/2017 1801   APPEARANCEUR HAZY (A) 01/31/2017 1801   LABSPEC 1.017 01/31/2017 1801   PHURINE 6.0 01/31/2017 1801   GLUCOSEU NEGATIVE 01/31/2017 1801   HGBUR LARGE (A) 01/31/2017 1801   BILIRUBINUR NEGATIVE 01/31/2017 1801   BILIRUBINUR neg 10/07/2014 1122   KETONESUR NEGATIVE 01/31/2017 1801   PROTEINUR 100 (A) 01/31/2017 1801   UROBILINOGEN negative 10/07/2014 1122   UROBILINOGEN 0.2 04/03/2014 2308   NITRITE NEGATIVE 01/31/2017 1801   LEUKOCYTESUR NEGATIVE 01/31/2017 1801   Sepsis Labs: !!!!!!!!!!!!!!!!!!!!!!!!!!!!!!!!!!!!!!!!!!!! '@LABRCNTIP'$ (procalcitonin:4,lacticidven:4) )No results found for this or any previous visit (from the past 240 hour(s)).   Radiological Exams on Admission: Dg Chest Port 1 View  Result Date: 01/31/2017 CLINICAL DATA:  Cough EXAM: PORTABLE CHEST 1 VIEW COMPARISON:  01/15/2017 FINDINGS: Normal heart size. Focal opacity has developed at the medial right lung base. No pneumothorax. No pleural effusion. IMPRESSION: Right basilar atelectasis versus airspace disease. Followup PA and lateral chest X-ray is recommended in 3-4 weeks following trial of antibiotic therapy to ensure resolution and exclude underlying malignancy. Electronically Signed   By: Marybelle Killings M.D.   On: 01/31/2017 16:52    EKG: Independently reviewed.   Assessment/Plan Principal Problem:   Chronic upper GI bleeding Active Problems:   Hypertension   Atrial fibrillation (HCC)   Paroxysmal atrial fibrillation (HCC)   Coagulopathy (HCC)   Upper GI bleed    PLAN:   UGIB:  It appears that he has a slow upper GI bleed, probably from PUD with NSAIDS use and ASA, along with recent prednisone use.  He has stable hemodynamics, and repeat Hb is  stable.  He did have a drop in baseline Hb of 13 grams per dL 2 weeks ago.  I think given his slow bleed, he can be admitted to telemetry.  Will reverse his INR and give him clear liquid.  Need to consult GI for EGD, but will defer decision to GI.  He has been on iron supplement, and his guaic in the ER was negative.  (his anemia "could be" part of the pancytopenia)  Supratherapeutic INR:  I think it is from the antibiotics.  Will give Vit K to correct.  Afib:  Rate is controlled.  Will continue his CCB.  UTI:  Will continue with IV Rocephin given in the ER.   HTN:  Stable.    Leukocytosis:  He actually has pancytopenia.  Unclear exact etiology.  Could be viral syndrome given his recent illness.  Will repeat in the am.  He doesn't drink alcohol.  Needs work up if persists.   DVT prophylaxis: SCD.  Code Status: DNR.  I confirmed with him.  Family Communication: None at bedside.  Disposition Plan: Likely home.  Consults called: None. Admission status: OBS.   Venita Seng MD FACP. [facility]  If 7PM-7AM, please contact night-coverage [email or web address] Password [facility]  01/31/2017, 7:47 PM

## 2017-01-31 NOTE — ED Provider Notes (Signed)
[facility] DEPT Provider Note   CSN: [id number] Arrival date & time: 01/31/17  2952     History   Chief Complaint Chief Complaint  Patient presents with  . Abnormal Lab    HPI Don [name] is a 76 y.o. male.  Patient was transferred here from his primary care office [facility]) after his blood work was noted to be abnormal. Apparently, his hemoglobin was 8.3 and INR 6.4 today in the office.  He had a recent diagnosis of strep pharyngitis and was rxed an antibiotic and steroids. He has dark stools, but he is also on iron. He was admitted to the hospital from 2/23-3/8 for resp distress and other issues.  He feels weak. No chest pain, dyspnea, fever, sweats, chills.      Past Medical History:  Diagnosis Date  . Acute bronchitis 04/03/2014  . Anxiety   . Atrial fibrillation ([place])   . Cataract   . COPD (chronic obstructive pulmonary disease) ([place])   . Essential hypertension   . Hyperlipidemia   . Macular degeneration   . Noncompliance with medications 04/2013   Xarelto, digoxin previously  . Pre-diabetes   . Prostate cancer ([place])   . Stage III chronic kidney disease 04/03/2014  . Tubular adenoma of colon 07/31/02, 11/18/03    Patient Active Problem List   Diagnosis Date Noted  . Respiratory distress   . SOB (shortness of breath)   . COPD exacerbation ([place]) 01/05/2017  . Atrial fibrillation with RVR ([place]) 01/05/2017  . Acute respiratory failure with hypoxia ([place]) 01/05/2017  . AKI (acute kidney injury) ([place]) 01/05/2017  . Influenza A 01/05/2017  . Pre-diabetes 07/23/2015  . Metabolic syndrome 84/13/2440  . Urinary bladder incontinence 03/04/2015  . Paroxysmal atrial fibrillation ([place]) 05/25/2014  . Hiatal hernia 04/06/2014  . Elevated troponin 04/04/2014  . Other and unspecified hyperlipidemia 04/04/2014  . Personal history of colonic polyps 04/04/2014  . History of prostate cancer 04/03/2014  . Stage III chronic kidney disease 04/03/2014  .  Tenosynovitis of thumb 10/16/2013  . Atrial fibrillation ([place]) 05/19/2013  . Tobacco abuse 04/25/2013  . Hernia 02/08/2011  . Prostate cancer ([place]) 02/08/2011  . COPD (chronic obstructive pulmonary disease) ([place]) 02/08/2011  . Hypertension 02/08/2011    Past Surgical History:  Procedure Laterality Date  . Anal abcess,Hemorroids,    . COLONOSCOPY N/A 04/05/2014   Procedure: COLONOSCOPY;  Surgeon: Ladene Artist, MD;  Location: [facility] ENDOSCOPY;  Service: Endoscopy;  Laterality: N/A;  . COLONOSCOPY W/ BIOPSIES  11/18/2003   Dr. Earle Gell  . ESOPHAGOGASTRODUODENOSCOPY  11/18/2003   Dr. Earle Gell  . ESOPHAGOGASTRODUODENOSCOPY N/A 04/05/2014   Procedure: ESOPHAGOGASTRODUODENOSCOPY (EGD);  Surgeon: Ladene Artist, MD;  Location: [facility];  Service: Endoscopy;  Laterality: N/A;  . RETROPUBIC PROSTATECTOMY  11/26/2001  . TONSILLECTOMY         Home Medications    Prior to Admission medications   Medication Sig Start Date End Date Taking? Authorizing Provider  albuterol (PROVENTIL HFA;VENTOLIN HFA) 108 (90 Base) MCG/ACT inhaler Inhale 2 puffs into the lungs every 6 (six) hours as needed for wheezing or shortness of breath. 01/18/17  Yes Eber Jones, MD  ALLERGY RELIEF 10 MG tablet TAKE 1 TABLET DAILY FOR ALLERGY Patient taking differently: TAKE 1 TABLET DAILY FOR ALLERGY,    Loratidine 01/04/17  Yes Timmothy Euler, MD  amoxicillin-clavulanate (AUGMENTIN) 875-125 MG tablet Take 1 tablet by mouth 2 (two) times daily. 01/26/17  Yes Sharion Balloon, FNP  atorvastatin (  LIPITOR) 20 MG tablet TAKE 1 TABLET DAILY 12/15/16  Yes Wardell Honour, MD  bisoprolol (ZEBETA) 10 MG tablet Take 1 tablet (10 mg total) by mouth 2 (two) times daily. 01/18/17  Yes Eber Jones, MD  budesonide-formoterol [facility]) 160-4.5 MCG/ACT inhaler Inhale 2 puffs into the lungs 2 (two) times daily. 02/10/16  Yes Wardell Honour, MD  clonazePAM (KLONOPIN) 1 MG tablet TAKE 1/2 TABLET ONCE DAILY AS  NEEDED FOR ANXIETY 01/26/17  Yes Wardell Honour, MD  diltiazem (CARDIZEM CD) 180 MG 24 hr capsule Take 1 capsule (180 mg total) by mouth 2 (two) times daily. 01/18/17  Yes Eber Jones, MD  doxylamine, Sleep, (UNISOM) 25 MG tablet Take 25 mg by mouth at bedtime as needed.   Yes Historical Provider, MD  DULoxetine (CYMBALTA) 60 MG capsule TAKE (1) CAPSULE DAILY 11/20/16  Yes Terald Sleeper, PA-C  FERREX 150 150 MG capsule Take 1 capsule by mouth daily. 07/08/15  Yes Historical Provider, MD  Incontinence Supply Disposable (INCONTINENCE BRIEF LARGE) MISC Use as needed for urinary incontinence.  Dx: urinary incontinence R32 and prostate cancer C61 09/19/16  Yes Wardell Honour, MD  Multiple Vitamin (MULTIVITAMIN WITH MINERALS) TABS Take 1 tablet by mouth daily.   Yes Historical Provider, MD  nystatin (MYCOSTATIN) 100000 UNIT/ML suspension Take 5 mLs (500,000 Units total) by mouth 4 (four) times daily. 01/26/17  Yes Sharion Balloon, FNP  omega-3 acid ethyl esters (LOVAZA) 1 G capsule TAKE (2) CAPSULES TWICE DAILY. 07/28/15  Yes Wardell Honour, MD  pantoprazole (PROTONIX) 40 MG tablet TAKE 1 TABLET DAILY 12/02/16  Yes Chipper Herb, MD  Polyethyl Glycol-Propyl Glycol (SYSTANE OP) Place 1 drop into both eyes 2 (two) times daily as needed (dry eyes).    Yes Historical Provider, MD  polyethylene glycol (MIRALAX / GLYCOLAX) packet Take 17 g by mouth 2 (two) times daily. 01/18/17  Yes Eber Jones, MD  predniSONE (DELTASONE) 20 MG tablet 2 tabs PO X4 days then 1.5tabs PO daily x4 days then 1tab PO daily x4 days then 0.5tabs PO daily x4 days 01/18/17  Yes Eber Jones, MD  Probiotic Product (PRO-BIOTIC BLEND) CAPS Take 1 capsule by mouth daily. 07/23/15  Yes Cherre Robins, PharmD  terazosin (HYTRIN) 2 MG capsule TAKE 1 CAPSULE AT BEDTIME 09/20/16  Yes Wardell Honour, MD  tiotropium (SPIRIVA) 18 MCG inhalation capsule Place 1 capsule (18 mcg total) into inhaler and inhale daily. 01/18/17  Yes Eber Jones, MD  triamcinolone cream (KENALOG) 0.1 % Apply 1 application topically 2 (two) times daily. 11/10/16  Yes Cherre Robins, PharmD  warfarin (COUMADIN) 4 MG tablet Take 1 and 1/2 tablets (= '6mg'$ ) daily except on Sundays and Thursdays take 1 tablet (='4mg'$ ) 01/04/17  Yes Wardell Honour, MD  nitroGLYCERIN (NITROSTAT) 0.4 MG SL tablet Place 1 tablet (0.4 mg total) under the tongue every 5 (five) minutes as needed for chest pain. Patient not taking: Reported on 01/26/2017 02/10/16   Wardell Honour, MD    Family History Family History  Problem Relation Age of Onset  . Diabetes Father   . Heart disease Father     MI  . Heart attack Father   . Heart attack Mother   . Heart disease Brother   . Cancer Brother     lung  . Lung cancer Brother   . Heart disease Brother   . Lung cancer Sister   . Cancer Sister  lung  . Heart disease Brother   . Heart disease Brother     Social History Social History  Substance Use Topics  . Smoking status: Former Smoker    Packs/day: 1.00    Years: 62.00    Types: Cigarettes, Cigars    Quit date: 12/28/2016  . Smokeless tobacco: Never Used  . Alcohol use No     Allergies   Wellbutrin [bupropion]   Review of Systems Review of Systems  All other systems reviewed and are negative.    Physical Exam Updated Vital Signs BP 116/75   Pulse (!) 48   Temp 97.6 F (36.4 C) (Oral)   Resp (!) 23   Ht '5\' 9"'$  (1.753 m)   Wt 167 lb (75.8 kg)   SpO2 98%   BMI 24.66 kg/m   Physical Exam  Constitutional: He is oriented to person, place, and time.  Pale, no acute distress  HENT:  Head: Normocephalic and atraumatic.  Eyes: Conjunctivae are normal.  Neck: Neck supple.  Cardiovascular: Normal rate and regular rhythm.   Pulmonary/Chest: Effort normal and breath sounds normal.  Abdominal: Soft. Bowel sounds are normal.  Genitourinary:  Genitourinary Comments: Rectal exam: No masses. Heme negative.  Musculoskeletal: Normal range of motion.    Neurological: He is alert and oriented to person, place, and time.  Skin: Skin is warm and dry.  Psychiatric:  Flat affect  Nursing note and vitals reviewed.    ED Treatments / Results  Labs (all labs ordered are listed, but only abnormal results are displayed) Labs Reviewed  COMPREHENSIVE METABOLIC PANEL - Abnormal; Notable for the following:       Result Value   Glucose, Bld 192 (*)    BUN 41 (*)    Creatinine, Ser 2.50 (*)    Calcium 7.7 (*)    Total Protein 5.3 (*)    Albumin 2.5 (*)    GFR calc non Af Amer 23 (*)    GFR calc Af Amer 27 (*)    All other components within normal limits  CBC - Abnormal; Notable for the following:    WBC 2.1 (*)    RBC 2.96 (*)    Hemoglobin 10.0 (*)    HCT 29.0 (*)    Platelets 91 (*)    All other components within normal limits  PROTIME-INR - Abnormal; Notable for the following:    Prothrombin Time 47.1 (*)    INR 4.90 (*)    All other components within normal limits  URINALYSIS, ROUTINE W REFLEX MICROSCOPIC - Abnormal; Notable for the following:    APPearance HAZY (*)    Hgb urine dipstick LARGE (*)    Protein, ur 100 (*)    All other components within normal limits  TYPE AND SCREEN    EKG  EKG Interpretation None       Radiology Dg Chest Port 1 View  Result Date: 01/31/2017 CLINICAL DATA:  Cough EXAM: PORTABLE CHEST 1 VIEW COMPARISON:  01/15/2017 FINDINGS: Normal heart size. Focal opacity has developed at the medial right lung base. No pneumothorax. No pleural effusion. IMPRESSION: Right basilar atelectasis versus airspace disease. Followup PA and lateral chest X-ray is recommended in 3-4 weeks following trial of antibiotic therapy to ensure resolution and exclude underlying malignancy. Electronically Signed   By: Marybelle Killings M.D.   On: 01/31/2017 16:52    Procedures Procedures (including critical care time)  Medications Ordered in ED Medications  sodium chloride 0.9 % bolus 500 mL (500 mLs Intravenous  New Bag/Given  01/31/17 1732)     Initial Impression / Assessment and Plan / ED Course  I have reviewed the triage vital signs and the nursing notes.  Pertinent labs & imaging results that were available during my care of the patient were reviewed by me and considered in my medical decision making (see chart for details).   Patient's hemoglobin on 01/16/17 was 13.4.  Earlier today hemoglobin was 8.3. Now it is 10.0. He could be hemoconcentrated at this time secondary to dehydration. Rectal exam negative for hemoglobin; however, he has complained of dark stools. Creatinine has been chronically elevated in the 2-3 range. Discussed with Dr. Marin Comment.  Will admit.    Final Clinical Impressions(s) / ED Diagnoses   Final diagnoses:  Anemia, unspecified type  INR (international normal ratio) abnormal    New Prescriptions New Prescriptions   No medications on file     Nat Christen, MD 01/31/17 [phone]

## 2017-01-31 NOTE — ED Triage Notes (Signed)
Pt was sent here from [facility] for a hemoglobin of 8.3 and an INR of 6.4. Pt recently had strep and was placed on antibiotics and steroids.  Denies any pain. Pt states he has dark stools but believes that comes from his iron pill.

## 2017-01-31 NOTE — ED Notes (Signed)
Report given to Claire RN

## 2017-01-31 NOTE — Progress Notes (Signed)
Patient ID: Don [name], male   DOB: December 10, 1940, 76 y.o.   MRN: [id number]   Subjective:     Indication: atrial fibrillation Bleeding signs/symptoms: Yes - bruise on right thigh.  Penny sized bruise on center of neck.  HBG has dropped to 8.3 when checked today. When in hospital from 01/05/2017 to 01/16/2017 HBG was stable, ranging from 11.3 to 21.1 Thromboembolic signs/symptoms: None  Missed Coumadin doses: None Medication changes: yes - patient has 2 days left on 16 days course of prednisone.  He also has 3 pills of amoxicillin + clav acid which was started 01/26/17 Dietary changes: no Bacterial/viral infection: yes - strep throat diagnosed 01/26/17 Other concerns: yes - INR is very elelvated with HBG drop of over 2.0   Objective:    INR Today: 6.4 HBG = 8.3 Current dose: warfarin '4mg'$  Sundays and Thursdays and '6mg'$  all other days.  Assessment:    Supratherapeutic INR for goal of 2-3   Decrease in hemoglobin.  Plan:    1.  Discussed with patient's PCP Dr Sabra Heck - recommended that patient go to ER.  His step son is with him today and will take him now  2. Hold warfarin until told otherwise - Step son to remove from prefilled pill packaging.

## 2017-02-01 ENCOUNTER — Encounter: Payer: Medicare HMO | Admitting: Physician Assistant

## 2017-02-01 ENCOUNTER — Other Ambulatory Visit: Payer: Self-pay

## 2017-02-01 ENCOUNTER — Encounter: Payer: Medicare HMO | Admitting: Adult Health

## 2017-02-01 ENCOUNTER — Encounter (HOSPITAL_COMMUNITY): Payer: Self-pay | Admitting: Gastroenterology

## 2017-02-01 DIAGNOSIS — I480 Paroxysmal atrial fibrillation: Secondary | ICD-10-CM | POA: Diagnosis present

## 2017-02-01 DIAGNOSIS — F419 Anxiety disorder, unspecified: Secondary | ICD-10-CM | POA: Diagnosis present

## 2017-02-01 DIAGNOSIS — D649 Anemia, unspecified: Secondary | ICD-10-CM

## 2017-02-01 DIAGNOSIS — I129 Hypertensive chronic kidney disease with stage 1 through stage 4 chronic kidney disease, or unspecified chronic kidney disease: Secondary | ICD-10-CM | POA: Diagnosis present

## 2017-02-01 DIAGNOSIS — R3129 Other microscopic hematuria: Secondary | ICD-10-CM | POA: Diagnosis present

## 2017-02-01 DIAGNOSIS — J449 Chronic obstructive pulmonary disease, unspecified: Secondary | ICD-10-CM | POA: Diagnosis present

## 2017-02-01 DIAGNOSIS — D61818 Other pancytopenia: Secondary | ICD-10-CM

## 2017-02-01 DIAGNOSIS — Z66 Do not resuscitate: Secondary | ICD-10-CM | POA: Diagnosis present

## 2017-02-01 DIAGNOSIS — N183 Chronic kidney disease, stage 3 (moderate): Secondary | ICD-10-CM | POA: Diagnosis present

## 2017-02-01 DIAGNOSIS — I10 Essential (primary) hypertension: Secondary | ICD-10-CM | POA: Diagnosis not present

## 2017-02-01 DIAGNOSIS — D689 Coagulation defect, unspecified: Secondary | ICD-10-CM | POA: Diagnosis present

## 2017-02-01 DIAGNOSIS — Z87891 Personal history of nicotine dependence: Secondary | ICD-10-CM | POA: Diagnosis not present

## 2017-02-01 DIAGNOSIS — K222 Esophageal obstruction: Secondary | ICD-10-CM | POA: Diagnosis present

## 2017-02-01 DIAGNOSIS — R791 Abnormal coagulation profile: Secondary | ICD-10-CM | POA: Diagnosis present

## 2017-02-01 DIAGNOSIS — K2970 Gastritis, unspecified, without bleeding: Secondary | ICD-10-CM | POA: Diagnosis present

## 2017-02-01 DIAGNOSIS — E785 Hyperlipidemia, unspecified: Secondary | ICD-10-CM | POA: Diagnosis present

## 2017-02-01 DIAGNOSIS — I4891 Unspecified atrial fibrillation: Secondary | ICD-10-CM | POA: Diagnosis not present

## 2017-02-01 DIAGNOSIS — Z888 Allergy status to other drugs, medicaments and biological substances status: Secondary | ICD-10-CM | POA: Diagnosis not present

## 2017-02-01 DIAGNOSIS — K5710 Diverticulosis of small intestine without perforation or abscess without bleeding: Secondary | ICD-10-CM | POA: Diagnosis present

## 2017-02-01 DIAGNOSIS — K210 Gastro-esophageal reflux disease with esophagitis: Secondary | ICD-10-CM | POA: Diagnosis present

## 2017-02-01 DIAGNOSIS — D62 Acute posthemorrhagic anemia: Secondary | ICD-10-CM | POA: Diagnosis present

## 2017-02-01 DIAGNOSIS — H3530 Unspecified macular degeneration: Secondary | ICD-10-CM | POA: Diagnosis present

## 2017-02-01 DIAGNOSIS — K922 Gastrointestinal hemorrhage, unspecified: Secondary | ICD-10-CM | POA: Diagnosis present

## 2017-02-01 DIAGNOSIS — Z7901 Long term (current) use of anticoagulants: Secondary | ICD-10-CM | POA: Diagnosis not present

## 2017-02-01 DIAGNOSIS — N390 Urinary tract infection, site not specified: Secondary | ICD-10-CM | POA: Diagnosis present

## 2017-02-01 DIAGNOSIS — Z7951 Long term (current) use of inhaled steroids: Secondary | ICD-10-CM | POA: Diagnosis not present

## 2017-02-01 LAB — CBC
HCT: 25.7 % — ABNORMAL LOW (ref 39.0–52.0)
Hemoglobin: 9 g/dL — ABNORMAL LOW (ref 13.0–17.0)
MCH: 34 pg (ref 26.0–34.0)
MCHC: 35 g/dL (ref 30.0–36.0)
MCV: 97 fL (ref 78.0–100.0)
PLATELETS: 94 10*3/uL — AB (ref 150–400)
RBC: 2.65 MIL/uL — ABNORMAL LOW (ref 4.22–5.81)
RDW: 13.8 % (ref 11.5–15.5)
WBC: 2.3 10*3/uL — AB (ref 4.0–10.5)

## 2017-02-01 LAB — HEMATOCRIT
HCT: 26.1 % — ABNORMAL LOW (ref 39.0–52.0)
HEMATOCRIT: 25.9 % — AB (ref 39.0–52.0)
HEMATOCRIT: 26 % — AB (ref 39.0–52.0)

## 2017-02-01 LAB — HEMOGLOBIN
HEMOGLOBIN: 9 g/dL — AB (ref 13.0–17.0)
Hemoglobin: 8.9 g/dL — ABNORMAL LOW (ref 13.0–17.0)
Hemoglobin: 9.1 g/dL — ABNORMAL LOW (ref 13.0–17.0)

## 2017-02-01 LAB — PROTIME-INR: PROTHROMBIN TIME: 55.4 s — AB (ref 11.4–15.2)

## 2017-02-01 MED ORDER — VITAMIN K1 10 MG/ML IJ SOLN
2.5000 mg | Freq: Once | INTRAMUSCULAR | Status: AC
  Administered 2017-02-01: 2.5 mg via SUBCUTANEOUS
  Filled 2017-02-01: qty 1

## 2017-02-01 MED ORDER — DEXTROSE 5 % IV SOLN
INTRAVENOUS | Status: AC
  Filled 2017-02-01: qty 10

## 2017-02-01 NOTE — Progress Notes (Signed)
PROGRESS NOTE    Don [name]  FOY:[id number] DOB: October 13, 1941 DOA: 01/31/2017 PCP: Don Honour, MD    Brief Narrative:  Don [name] is an 76 y.o. male with hx of afib on chronic anticoagulation with Coumadin, hx of CKD III, anxiety, COPD, prior tobacco use, hx of prostate cancer, presented to the ER for black stool, and Hb of 8g per dL, and INR of 6 at the PCP's office.  He has been on iron supplement, and his stool guaic in the ER was negative.  He was given prednisone recently, and had taken ASA and advil per his account, along with being given augmentin recently.  In the ER, repeat Hb was 10 grams per dL, and repeat INR was 6.  His baseline Hb was 13 g per dL>  His platelet count was 91K,  And his WBC was 2.1K.  His UA was found to have microscopic hematuria, along with pyuria of 6-30 WBCs.  He was given IV rocephin.  He had colonsocpy twice, he said, and it was negative.  This was at the [place].  He said it was planned for him to have EGD.  Hospitalist was asked to admit him for UGIB, coagulopathy, UTI, and anemia. He denied current alcohol or tobacco use.   Assessment & Plan:   Principal Problem:   Chronic upper GI bleeding Active Problems:   Hypertension   Atrial fibrillation (HCC)   Paroxysmal atrial fibrillation (HCC)   Coagulopathy (HCC)   Upper GI bleed   Pancytopenia (HCC)   UTI (urinary tract infection)   INR (international normal ratio) abnormal   Normocytic anemia   UGIB:   - stable hemodynamics - repeat Hb is stable - monitor H/H q12 - additional vitamin K dose given today - GI consulted- INR needs to be closer to 1.8 prior to EGD - hold iron   Supratherapeutic INR:   - given 2 doses of Vit K to correct - daily INR.  Afib:   - Rate is controlled - continue his CCB.  UTI:   - continue with IV Rocephin given in the ER.   HTN:   - Stable.    Leukocytosis - He actually has pancytopenia - possibly related to recent viral illness - will need  outpatient workup   DVT prophylaxis: SCD.  Code Status: DNR  Family Communication: Son is bedside Disposition Plan: Likely home.    Consultants:   GI  PT  Procedures:   None  Antimicrobials:  None  Subjective: Patient seen while waiting for bath; son is bedside.  Asking questions about what is taking so long to get his endoscopy.  Voices he has done well at home since last hospitalization.  Objective: Vitals:   02/01/17 0528 02/01/17 0916 02/01/17 0921 02/01/17 0923  BP: (!) 118/54     Pulse: 61     Resp: 18     Temp: 98.1 F (36.7 C)     TempSrc: Oral     SpO2: 95% 95% 94% 96%  Weight:      Height:        Intake/Output Summary (Last 24 hours) at 02/01/17 1429 Last data filed at 02/01/17 0928  Gross per 24 hour  Intake          1518.33 ml  Output                1 ml  Net          1517.33 ml   [employer]  01/31/17 1604 01/31/17 2208  Weight: 75.8 kg (167 lb) 74.5 kg (164 lb 4.8 oz)    Examination:  General exam: Appears calm and comfortable  Respiratory system: Clear to auscultation. Respiratory effort normal. Cardiovascular system: S1 & S2 heard, RRR. No JVD, murmurs, rubs, gallops or clicks. No pedal edema. Gastrointestinal system: Abdomen is nondistended, soft and minimally tender in the left lower quadrant. No organomegaly or masses felt. Normal bowel sounds heard. Central nervous system: Alert and oriented. No focal neurological deficits. Extremities: Symmetric 5 x 5 power. Skin: No rashes, lesions or ulcers Psychiatry: Mood & affect appropriate.     Data Reviewed: I have personally reviewed following labs and imaging studies  CBC:  Recent Labs Lab 01/31/17 1621 01/31/17 2235 02/01/17 0351 02/01/17 0941  WBC 2.1*  --  2.3*  --   HGB 10.0* 9.3* 9.0* 8.9*  HCT 29.0* 26.2* 25.7* 25.9*  MCV 98.0  --  97.0  --   PLT 91*  --  94*  --    Basic Metabolic Panel:  Recent Labs Lab 01/31/17 1621  NA 135  K 4.5  CL 103  CO2 26    GLUCOSE 192*  BUN 41*  CREATININE 2.50*  CALCIUM 7.7*   GFR: Estimated Creatinine Clearance: 24.7 mL/min (A) (by C-G formula based on SCr of 2.5 mg/dL (H)). Liver Function Tests:  Recent Labs Lab 01/31/17 1621  AST 26  ALT 46  ALKPHOS 91  BILITOT 0.7  PROT 5.3*  ALBUMIN 2.5*   No results for input(s): LIPASE, AMYLASE in the last 168 hours. No results for input(s): AMMONIA in the last 168 hours. Coagulation Profile:  Recent Labs Lab 01/31/17 1431 01/31/17 1636 02/01/17 0351  INR 6.4* 4.90* >4.01*   Cardiac Enzymes: No results for input(s): CKTOTAL, CKMB, CKMBINDEX, TROPONINI in the last 168 hours. BNP (last 3 results) No results for input(s): PROBNP in the last 8760 hours. HbA1C: No results for input(s): HGBA1C in the last 72 hours. CBG: No results for input(s): GLUCAP in the last 168 hours. Lipid Profile: No results for input(s): CHOL, HDL, LDLCALC, TRIG, CHOLHDL, LDLDIRECT in the last 72 hours. Thyroid Function Tests: No results for input(s): TSH, T4TOTAL, FREET4, T3FREE, THYROIDAB in the last 72 hours. Anemia Panel: No results for input(s): VITAMINB12, FOLATE, FERRITIN, TIBC, IRON, RETICCTPCT in the last 72 hours. Sepsis Labs: No results for input(s): PROCALCITON, LATICACIDVEN in the last 168 hours.  No results found for this or any previous visit (from the past 240 hour(s)).       Radiology Studies: Dg Chest Port 1 View  Result Date: 01/31/2017 CLINICAL DATA:  Cough EXAM: PORTABLE CHEST 1 VIEW COMPARISON:  01/15/2017 FINDINGS: Normal heart size. Focal opacity has developed at the medial right lung base. No pneumothorax. No pleural effusion. IMPRESSION: Right basilar atelectasis versus airspace disease. Followup PA and lateral chest X-ray is recommended in 3-4 weeks following trial of antibiotic therapy to ensure resolution and exclude underlying malignancy. Electronically Signed   By: Marybelle Killings M.D.   On: 01/31/2017 16:52        Scheduled  Meds: . atorvastatin  20 mg Oral Daily  . bisoprolol  10 mg Oral BID  . cefTRIAXone (ROCEPHIN)  IV  1 g Intravenous Q24H  . diltiazem  180 mg Oral BID  . DULoxetine  60 mg Oral Daily  . mometasone-formoterol  2 puff Inhalation BID  . omega-3 acid ethyl esters  1 g Oral Daily  . pantoprazole (PROTONIX) IV  40 mg  Intravenous Q12H  . sodium chloride flush  3 mL Intravenous Q12H  . tiotropium  18 mcg Inhalation Daily   Continuous Infusions: . dextrose 5 % and 0.9% NaCl 50 mL/hr at 01/31/17 2330     LOS: 0 days    Time spent: 35 minutes    Loretha Stapler, MD [facility] Pager [phone]  If 7PM-7AM, please contact night-coverage [email or web address] Password [email or web address] 02/01/2017, 2:30 PM

## 2017-02-01 NOTE — Progress Notes (Deleted)
Cardiology Office Note    Date:  02/01/2017   ID:  Don [name], Don [name] [birth year], MRN [id number]  PCP:  Wardell Honour, MD  Cardiologist:   No chief complaint on file.   History of Present Illness:  Don [name] is a 76 y.o. male ***    Past Medical History:  Diagnosis Date  . Acute bronchitis 04/03/2014  . Anxiety   . Atrial fibrillation ([place])   . Cataract   . COPD (chronic obstructive pulmonary disease) ([place])   . Essential hypertension   . Hyperlipidemia   . Macular degeneration   . Noncompliance with medications 04/2013   Xarelto, digoxin previously  . Pre-diabetes   . Prostate cancer ([place])   . Stage III chronic kidney disease 04/03/2014  . Tubular adenoma of colon 07/31/02, 11/18/03    Past Surgical History:  Procedure Laterality Date  . Anal abcess,Hemorroids,    . COLONOSCOPY N/A 04/05/2014   Procedure: COLONOSCOPY;  Surgeon: Ladene Artist, MD;  Location: [facility];  Service: Endoscopy;  Laterality: N/A;  . COLONOSCOPY W/ BIOPSIES  11/18/2003   Dr. Earle Gell  . ESOPHAGOGASTRODUODENOSCOPY  11/18/2003   Dr. Earle Gell  . ESOPHAGOGASTRODUODENOSCOPY N/A 04/05/2014   Procedure: ESOPHAGOGASTRODUODENOSCOPY (EGD);  Surgeon: Ladene Artist, MD;  Location: [facility];  Service: Endoscopy;  Laterality: N/A;  . RETROPUBIC PROSTATECTOMY  11/26/2001  . TONSILLECTOMY      Current Medications: Facility-Administered Medications Prior to Visit  Medication Dose Route Frequency Provider Last Rate Last Dose  . acetaminophen (TYLENOL) tablet 650 mg  650 mg Oral Q6H PRN Orvan Falconer, MD   650 mg at 01/31/17 2327   Or  . acetaminophen (TYLENOL) suppository 650 mg  650 mg Rectal Q6H PRN Orvan Falconer, MD      . albuterol (PROVENTIL) (2.5 MG/3ML) 0.083% nebulizer solution 3 mL  3 mL Inhalation Q6H PRN Orvan Falconer, MD   3 mL at 02/01/17 0915  . atorvastatin (LIPITOR) tablet 20 mg  20 mg Oral Daily Orvan Falconer, MD   20 mg at 02/01/17 1021  . bisoprolol (ZEBETA) tablet 10 mg  10  mg Oral BID Orvan Falconer, MD      . cefTRIAXone (ROCEPHIN) 1 g in dextrose 5 % 50 mL IVPB  1 g Intravenous Q24H Orvan Falconer, MD   1 g at 02/01/17 0039  . clonazePAM (KLONOPIN) tablet 0.25 mg  0.25 mg Oral BID PRN Orvan Falconer, MD      . dextrose 5 %-0.9 % sodium chloride infusion   Intravenous Continuous Orvan Falconer, MD 50 mL/hr at 01/31/17 2330    . diltiazem (CARDIZEM CD) 24 hr capsule 180 mg  180 mg Oral BID Orvan Falconer, MD   180 mg at 02/01/17 1021  . DULoxetine (CYMBALTA) DR capsule 60 mg  60 mg Oral Daily Orvan Falconer, MD   60 mg at 02/01/17 1021  . mometasone-formoterol (DULERA) 200-5 MCG/ACT inhaler 2 puff  2 puff Inhalation BID Orvan Falconer, MD   2 puff at 02/01/17 [phone]  . omega-3 acid ethyl esters (LOVAZA) capsule 1 g  1 g Oral Daily Orvan Falconer, MD   1 g at 02/01/17 1021  . ondansetron (ZOFRAN) tablet 4 mg  4 mg Oral Q6H PRN Orvan Falconer, MD       Or  . ondansetron [facility]) injection 4 mg  4 mg Intravenous Q6H PRN Orvan Falconer, MD      . pantoprazole (PROTONIX) injection 40 mg  40 mg Intravenous Q12H Collier Salina  Le, MD   40 mg at 02/01/17 1021  . polyvinyl alcohol (LIQUIFILM TEARS) 1.4 % ophthalmic solution 1 drop  1 drop Both Eyes PRN Orvan Falconer, MD      . sodium chloride flush (NS) 0.9 % injection 3 mL  3 mL Intravenous Q12H Orvan Falconer, MD   3 mL at 02/01/17 1028  . tiotropium (SPIRIVA) inhalation capsule 18 mcg  18 mcg Inhalation Daily Orvan Falconer, MD   18 mcg at 02/01/17 0102   Outpatient Medications Prior to Visit  Medication Sig Dispense Refill  . albuterol (PROVENTIL HFA;VENTOLIN HFA) 108 (90 Base) MCG/ACT inhaler Inhale 2 puffs into the lungs every 6 (six) hours as needed for wheezing or shortness of breath. 1 Inhaler 0  . ALLERGY RELIEF 10 MG tablet TAKE 1 TABLET DAILY FOR ALLERGY (Patient taking differently: TAKE 1 TABLET DAILY FOR ALLERGY,    Loratidine) 30 tablet 5  . amoxicillin-clavulanate (AUGMENTIN) 875-125 MG tablet Take 1 tablet by mouth 2 (two) times daily. 14 tablet 0  . atorvastatin (LIPITOR) 20 MG tablet TAKE 1  TABLET DAILY 30 tablet 2  . bisoprolol (ZEBETA) 10 MG tablet Take 1 tablet (10 mg total) by mouth 2 (two) times daily. 30 tablet 0  . budesonide-formoterol (SYMBICORT) 160-4.5 MCG/ACT inhaler Inhale 2 puffs into the lungs 2 (two) times daily. 1 Inhaler 3  . clonazePAM (KLONOPIN) 1 MG tablet TAKE 1/2 TABLET ONCE DAILY AS NEEDED FOR ANXIETY 15 tablet 1  . diltiazem (CARDIZEM CD) 180 MG 24 hr capsule Take 1 capsule (180 mg total) by mouth 2 (two) times daily. 60 capsule 0  . doxylamine, Sleep, (UNISOM) 25 MG tablet Take 25 mg by mouth at bedtime as needed.    . DULoxetine (CYMBALTA) 60 MG capsule TAKE (1) CAPSULE DAILY 30 capsule 1  . FERREX 150 150 MG capsule Take 1 capsule by mouth daily.    . Incontinence Supply Disposable (INCONTINENCE BRIEF LARGE) MISC Use as needed for urinary incontinence.  Dx: urinary incontinence R32 and prostate cancer C61 200 each 1  . Multiple Vitamin (MULTIVITAMIN WITH MINERALS) TABS Take 1 tablet by mouth daily.    . nitroGLYCERIN (NITROSTAT) 0.4 MG SL tablet Place 1 tablet (0.4 mg total) under the tongue every 5 (five) minutes as needed for chest pain. (Patient not taking: Reported on 01/26/2017) 25 tablet 2  . nystatin (MYCOSTATIN) 100000 UNIT/ML suspension Take 5 mLs (500,000 Units total) by mouth 4 (four) times daily. 473 mL 2  . omega-3 acid ethyl esters (LOVAZA) 1 G capsule TAKE (2) CAPSULES TWICE DAILY. 120 capsule 2  . pantoprazole (PROTONIX) 40 MG tablet TAKE 1 TABLET DAILY 30 tablet 2  . Polyethyl Glycol-Propyl Glycol (SYSTANE OP) Place 1 drop into both eyes 2 (two) times daily as needed (dry eyes).     . polyethylene glycol (MIRALAX / GLYCOLAX) packet Take 17 g by mouth 2 (two) times daily. 14 each 0  . predniSONE (DELTASONE) 20 MG tablet 2 tabs PO X4 days then 1.5tabs PO daily x4 days then 1tab PO daily x4 days then 0.5tabs PO daily x4 days 22 tablet 0  . Probiotic Product (PRO-BIOTIC BLEND) CAPS Take 1 capsule by mouth daily.    [name] terazosin (HYTRIN) 2 MG  capsule TAKE 1 CAPSULE AT BEDTIME 30 capsule 4  . tiotropium (SPIRIVA) 18 MCG inhalation capsule Place 1 capsule (18 mcg total) into inhaler and inhale daily. 30 capsule 0  . triamcinolone cream (KENALOG) 0.1 % Apply 1 application topically 2 (two) times  daily. 80 g 0  . warfarin (COUMADIN) 4 MG tablet Take 1 and 1/2 tablets (= '6mg'$ ) daily except on Sundays and Thursdays take 1 tablet (='4mg'$ ) 45 tablet 0     Allergies:   Wellbutrin [bupropion]   Social History   Social History  . Marital status: Married    Spouse name: N/A  . Number of children: 0  . Years of education: N/A   Occupational History  . retired    Social History Main Topics  . Smoking status: Former Smoker    Packs/day: 1.00    Years: 62.00    Types: Cigarettes, Cigars    Quit date: 12/28/2016  . Smokeless tobacco: Never Used  . Alcohol use No  . Drug use: No  . Sexual activity: No   Other Topics Concern  . 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 Married to his second wife, she is in a nursing home.     Family History:  [name] ***family history includes Cancer in his brother and sister; Diabetes in his father; Heart attack in his father and mother; Heart disease in his brother, brother, brother, brother, and father; Lung cancer in his brother and sister.   ROS:   Please see the history of present illness.    ROS All other systems reviewed and are negative.   PHYSICAL EXAM:   VS:  There were no vitals taken for this visit.  Physical Exam  GEN: Well nourished, well developed, in no acute distress HEENT: normal Neck: no JVD, carotid bruits, or masses Cardiac:RRR; no murmurs, rubs, or gallops  Respiratory:  clear to auscultation bilaterally, normal work of breathing GI: soft, nontender, nondistended, + BS Ext: without cyanosis, clubbing, or edema, Good distal pulses bilaterally MS: no deformity or atrophy Skin: warm and dry, no  rash Neuro:  Alert and Oriented x 3, Strength and sensation are intact Psych: euthymic mood, full affect  Wt Readings from Last 3 Encounters:  01/31/17 164 lb 4.8 oz (74.5 kg)  01/26/17 167 lb 12.8 oz (76.1 kg)  01/15/17 176 lb 2.4 oz (79.9 kg)      Studies/Labs Reviewed:   EKG:  EKG is*** ordered today.  The ekg ordered today demonstrates ***  Recent Labs: 01/05/2017: B Natriuretic Peptide 341.0; TSH 1.230 01/17/2017: Magnesium 1.9 01/31/2017: ALT 46; BUN 41; Creatinine, Ser 2.50; Potassium 4.5; Sodium 135 02/01/2017: Hemoglobin 8.9; Platelets 94   Lipid Panel    Component Value Date/Time   CHOL 110 07/24/2016 1409   TRIG 68 07/24/2016 1409   TRIG 176 (H) 10/16/2013 1318   HDL 37 (L) 07/24/2016 1409   HDL 35 (L) 10/16/2013 1318   CHOLHDL 3.0 07/24/2016 1409   LDLCALC 59 07/24/2016 1409   LDLCALC 90 10/16/2013 1318    Additional studies/ records that were reviewed today include:  ***    ASSESSMENT:    No diagnosis found.   PLAN:  In order of problems listed above:      Medication Adjustments/Labs and Tests Ordered: Current medicines are reviewed at length with the patient today.  Concerns regarding medicines are outlined above.  Medication changes, Labs and Tests ordered today are listed in the Patient Instructions below. There are no Patient Instructions on file for this visit.   Signed, Ermalinda Barrios, PA-C  02/01/2017 1:33 PM    [facility] [address], [place], [place]  [address] Phone: [phone])  [phone]; Fax: [phone]    

## 2017-02-01 NOTE — Consult Note (Signed)
Referring Provider: Eber Jones, MD Primary Care Physician:  Wardell Honour, MD Primary Gastroenterologist:  Silvano Rusk, MD  Reason for Consultation:  Dark stools, anemia  HPI: Don [name] is a 76 y.o. male with h/o Afib on coumadin, CKD III, COPD, h/o prostate cancer, recent admission (01/05/17-01/18/17) with COPD exacerbation and influenza who presented to ED with reported dark stools and drop in Hgb to 8.3 (had been 11.6 on 01/17/17) and INR 6.4 in PCP's office. Recently given prednisone, augmentin for strep pharyngitis as well as nystatin for oral thrush. He has been taking ASA (three '81mg'$  daily) and Advil as needed but not daily. DRE in ED heme negative. In ED, his Hgb was 10. Noted wbc 2.1 and platelets 91 (previously normal). His INR 4.9 in ED.   Patient was hospitalized in 2015 and during that hospitalization he had heme positive stool. He had an EGD and colonoscopy which revealed variable Z line, biopsy negative for Barrett's more consistent with reflux, small hiatal hernia, Ladd name removed from the sigmoid colon, internal hemorrhoids.  Patient reports dark stools since on iron for past couple of years. Seemed to be darker than normal recently. No nosebleeds or rectal bleeding. BM regular. No abdominal pain or heartburn. He has no teeth and gums have been very sore lately. Consuming soft foods only. Cough worse last few days in setting of chronic cough. No fever. c/o fatigue especially with exertion.    Prior to Admission medications   Medication Sig Start Date End Date Taking? Authorizing Provider  albuterol (PROVENTIL HFA;VENTOLIN HFA) 108 (90 Base) MCG/ACT inhaler Inhale 2 puffs into the lungs every 6 (six) hours as needed for wheezing or shortness of breath. 01/18/17  Yes Eber Jones, MD  ALLERGY RELIEF 10 MG tablet TAKE 1 TABLET DAILY FOR ALLERGY Patient taking differently: TAKE 1 TABLET DAILY FOR ALLERGY,    Loratidine 01/04/17  Yes Timmothy Euler, MD   amoxicillin-clavulanate (AUGMENTIN) 875-125 MG tablet Take 1 tablet by mouth 2 (two) times daily. 01/26/17  Yes Sharion Balloon, FNP  atorvastatin (LIPITOR) 20 MG tablet TAKE 1 TABLET DAILY 12/15/16  Yes Wardell Honour, MD  bisoprolol (ZEBETA) 10 MG tablet Take 1 tablet (10 mg total) by mouth 2 (two) times daily. 01/18/17  Yes Eber Jones, MD  budesonide-formoterol [facility]) 160-4.5 MCG/ACT inhaler Inhale 2 puffs into the lungs 2 (two) times daily. 02/10/16  Yes Wardell Honour, MD  clonazePAM (KLONOPIN) 1 MG tablet TAKE 1/2 TABLET ONCE DAILY AS NEEDED FOR ANXIETY 01/26/17  Yes Wardell Honour, MD  diltiazem (CARDIZEM CD) 180 MG 24 hr capsule Take 1 capsule (180 mg total) by mouth 2 (two) times daily. 01/18/17  Yes Eber Jones, MD  doxylamine, Sleep, (UNISOM) 25 MG tablet Take 25 mg by mouth at bedtime as needed.   Yes Historical Provider, MD  DULoxetine (CYMBALTA) 60 MG capsule TAKE (1) CAPSULE DAILY 11/20/16  Yes Terald Sleeper, PA-C  FERREX 150 150 MG capsule Take 1 capsule by mouth daily. 07/08/15  Yes Historical Provider, MD  Incontinence Supply Disposable (INCONTINENCE BRIEF LARGE) MISC Use as needed for urinary incontinence.  Dx: urinary incontinence R32 and prostate cancer C61 09/19/16  Yes Wardell Honour, MD  Multiple Vitamin (MULTIVITAMIN WITH MINERALS) TABS Take 1 tablet by mouth daily.   Yes Historical Provider, MD  nystatin (MYCOSTATIN) 100000 UNIT/ML suspension Take 5 mLs (500,000 Units total) by mouth 4 (four) times daily. 01/26/17  Yes Sharion Balloon, FNP  omega-3 acid ethyl esters (LOVAZA) 1 G capsule TAKE (2) CAPSULES TWICE DAILY. 07/28/15  Yes Wardell Honour, MD  pantoprazole (PROTONIX) 40 MG tablet TAKE 1 TABLET DAILY 12/02/16  Yes Chipper Herb, MD  Polyethyl Glycol-Propyl Glycol (SYSTANE OP) Place 1 drop into both eyes 2 (two) times daily as needed (dry eyes).    Yes Historical Provider, MD  polyethylene glycol (MIRALAX / GLYCOLAX) packet Take 17 g by mouth 2 (two)  times daily. 01/18/17  Yes Eber Jones, MD  predniSONE (DELTASONE) 20 MG tablet 2 tabs PO X4 days then 1.5tabs PO daily x4 days then 1tab PO daily x4 days then 0.5tabs PO daily x4 days 01/18/17  Yes Eber Jones, MD  Probiotic Product (PRO-BIOTIC BLEND) CAPS Take 1 capsule by mouth daily. 07/23/15  Yes Cherre Robins, PharmD  terazosin (HYTRIN) 2 MG capsule TAKE 1 CAPSULE AT BEDTIME 09/20/16  Yes Wardell Honour, MD  tiotropium (SPIRIVA) 18 MCG inhalation capsule Place 1 capsule (18 mcg total) into inhaler and inhale daily. 01/18/17  Yes Eber Jones, MD  triamcinolone cream (KENALOG) 0.1 % Apply 1 application topically 2 (two) times daily. 11/10/16  Yes Cherre Robins, PharmD  warfarin (COUMADIN) 4 MG tablet Take 1 and 1/2 tablets (= '6mg'$ ) daily except on Sundays and Thursdays take 1 tablet (='4mg'$ ) 01/04/17  Yes Wardell Honour, MD  nitroGLYCERIN (NITROSTAT) 0.4 MG SL tablet Place 1 tablet (0.4 mg total) under the tongue every 5 (five) minutes as needed for chest pain. Patient not taking: Reported on 01/26/2017 02/10/16   Wardell Honour, MD    Current Facility-Administered Medications  Medication Dose Route Frequency Provider Last Rate Last Dose  . acetaminophen (TYLENOL) tablet 650 mg  650 mg Oral Q6H PRN Orvan Falconer, MD   650 mg at 01/31/17 2327   Or  . acetaminophen (TYLENOL) suppository 650 mg  650 mg Rectal Q6H PRN Orvan Falconer, MD      . albuterol (PROVENTIL) (2.5 MG/3ML) 0.083% nebulizer solution 3 mL  3 mL Inhalation Q6H PRN Orvan Falconer, MD      . atorvastatin (LIPITOR) tablet 20 mg  20 mg Oral Daily Orvan Falconer, MD   20 mg at 01/31/17 2328  . bisoprolol (ZEBETA) tablet 10 mg  10 mg Oral BID Orvan Falconer, MD      . cefTRIAXone (ROCEPHIN) 1 g in dextrose 5 % 50 mL IVPB  1 g Intravenous Q24H Orvan Falconer, MD   1 g at 02/01/17 0039  . clonazePAM (KLONOPIN) tablet 0.25 mg  0.25 mg Oral BID PRN Orvan Falconer, MD      . dextrose 5 %-0.9 % sodium chloride infusion   Intravenous Continuous Orvan Falconer, MD 50  mL/hr at 01/31/17 2330    . diltiazem (CARDIZEM CD) 24 hr capsule 180 mg  180 mg Oral BID Orvan Falconer, MD   180 mg at 01/31/17 2327  . DULoxetine (CYMBALTA) DR capsule 60 mg  60 mg Oral Daily Orvan Falconer, MD      . mometasone-formoterol [facility]) 200-5 MCG/ACT inhaler 2 puff  2 puff Inhalation BID Orvan Falconer, MD      . omega-3 acid ethyl esters (LOVAZA) capsule 1 g  1 g Oral Daily Orvan Falconer, MD   1 g at 01/31/17 2327  . ondansetron (ZOFRAN) tablet 4 mg  4 mg Oral Q6H PRN Orvan Falconer, MD       Or  . ondansetron [facility]) injection 4 mg  4 mg Intravenous Q6H PRN Collier Salina  Le, MD      . pantoprazole (PROTONIX) injection 40 mg  40 mg Intravenous Q12H Orvan Falconer, MD   40 mg at 01/31/17 2327  . polyvinyl alcohol (LIQUIFILM TEARS) 1.4 % ophthalmic solution 1 drop  1 drop Both Eyes PRN Orvan Falconer, MD      . sodium chloride flush (NS) 0.9 % injection 3 mL  3 mL Intravenous Q12H Orvan Falconer, MD   3 mL at 01/31/17 2330  . tiotropium (SPIRIVA) inhalation capsule 18 mcg  18 mcg Inhalation Daily Orvan Falconer, MD        Allergies as of 01/31/2017 - Review Complete 01/31/2017  Allergen Reaction Noted  . Wellbutrin [bupropion] Anxiety 03/21/2013    Past Medical History:  Diagnosis Date  . Acute bronchitis 04/03/2014  . Anxiety   . Atrial fibrillation ([place])   . Cataract   . COPD (chronic obstructive pulmonary disease) ([place])   . Essential hypertension   . Hyperlipidemia   . Macular degeneration   . Noncompliance with medications 04/2013   Xarelto, digoxin previously  . Pre-diabetes   . Prostate cancer ([place])   . Stage III chronic kidney disease 04/03/2014  . Tubular adenoma of colon 07/31/02, 11/18/03    Past Surgical History:  Procedure Laterality Date  . Anal abcess,Hemorroids,    . COLONOSCOPY N/A 04/05/2014   Procedure: COLONOSCOPY;  Surgeon: Ladene Artist, MD;  Location: [facility];  Service: Endoscopy;  Laterality: N/A;  . COLONOSCOPY W/ BIOPSIES  11/18/2003   Dr. Earle Gell  . ESOPHAGOGASTRODUODENOSCOPY   11/18/2003   Dr. Earle Gell  . ESOPHAGOGASTRODUODENOSCOPY N/A 04/05/2014   Procedure: ESOPHAGOGASTRODUODENOSCOPY (EGD);  Surgeon: Ladene Artist, MD;  Location: [facility];  Service: Endoscopy;  Laterality: N/A;  . RETROPUBIC PROSTATECTOMY  11/26/2001  . TONSILLECTOMY      Family History  Problem Relation Age of Onset  . Diabetes Father   . Heart disease Father     MI  . Heart attack Father   . Heart attack Mother   . Heart disease Brother   . Cancer Brother     lung  . Lung cancer Brother   . Heart disease Brother   . Lung cancer Sister   . Cancer Sister     lung  . Heart disease Brother   . Heart disease Brother     Social History   Social History  . Marital status: Married    Spouse name: N/A  . Number of children: 0  . Years of education: N/A   Occupational History  . retired    Social History Main Topics  . Smoking status: Former Smoker    Packs/day: 1.00    Years: 62.00    Types: Cigarettes, Cigars    Quit date: 12/28/2016  . Smokeless tobacco: Never Used  . Alcohol use No  . Drug use: No  . Sexual activity: No   Other Topics Concern  . 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 Married to his second wife, she is in a nursing home.     ROS:  General: Negative for anorexia, weight loss, fever, chills, +++fatigue  Eyes: Negative for vision changes.  ENT: Negative for hoarseness, difficulty swallowing , nasal congestion.sore throat improved CV: Negative for chest pain, angina, palpitations,   peripheral edema. +DOE Respiratory: Negative for dyspnea at rest, dyspnea on exertion, cough, sputum, wheezing.  GI: See history of present illness.  GU:  Negative for dysuria, hematuria, urinary incontinence, urinary frequency, nocturnal urination.  MS: Negative for joint pain, low back pain.  Derm: Negative for rash or itching.  Neuro: Negative for weakness, abnormal sensation, seizure,  frequent headaches, memory loss, confusion.  Psych: Negative for anxiety, depression, suicidal ideation, hallucinations.  Endo: Negative for unusual weight change.  Heme: Negative for bruising or bleeding. Allergy: Negative for rash or hives.       Physical Examination: Vital signs in last 24 hours: Temp:  [97.6 F (36.4 C)-98.1 F (36.7 C)] 98.1 F (36.7 C) (03/22 0528) Pulse Rate:  [39-88] 61 (03/22 0528) Resp:  [16-24] 18 (03/22 0528) BP: (113-145)/(54-135) 118/54 (03/22 0528) SpO2:  [95 %-100 %] 95 % (03/22 0528) Weight:  [164 lb 4.8 oz (74.5 kg)-167 lb (75.8 kg)] 164 lb 4.8 oz (74.5 kg) (03/21 2208) Last BM Date: 01/30/17  General: chronically appearing WM, in NAD. Well-nourished, well-developed in no acute distress.  Head: Normocephalic, atraumatic.   Eyes: Conjunctiva pink, no icterus. Mouth: Oropharyngeal mucosa moist and pink , no lesions erythema or exudate. Edentulous. Neck: Supple without thyromegaly, masses, or lymphadenopathy.  Lungs: Clear to auscultation bilaterally.  Heart: Regular rate and rhythm, no murmurs rubs or gallops.  Abdomen: Bowel sounds are normal, nontender, nondistended, no hepatosplenomegaly or masses, no abdominal bruits or    hernia , no rebound or guarding.   Rectal:not performed. Done in ed. Extremities: No lower extremity edema, clubbing, deformity.  Neuro: Alert and oriented x 4 , grossly normal neurologically.  Skin: Warm and dry, no rash or jaundice.   Psych: Alert and cooperative, normal mood and affect.        Intake/Output from previous day: 03/21 0701 - 03/22 0700 In: 1278.3 [I.V.:178.3; IV Piggyback:1100] Out: 1 [Urine:1] Intake/Output this shift: No intake/output data recorded.  Lab Results: CBC  Recent Labs  01/31/17 1621 01/31/17 2235 02/01/17 0351  WBC 2.1*  --  2.3*  HGB 10.0* 9.3* 9.0*  HCT 29.0* 26.2* 25.7*  MCV 98.0  --  97.0  PLT 91*  --  94*   BMET  Recent Labs  01/31/17 1621  NA 135  K 4.5  CL 103   CO2 26  GLUCOSE 192*  BUN 41*  CREATININE 2.50*  CALCIUM 7.7*   LFT  Recent Labs  01/31/17 1621  BILITOT 0.7  ALKPHOS 91  AST 26  ALT 46  PROT 5.3*  ALBUMIN 2.5*    Lipase No results for input(s): LIPASE in the last 72 hours.  PT/INR  Recent Labs  01/31/17 1431 01/31/17 1636 02/01/17 0351  LABPROT  --  47.1* 55.4*  INR 6.4* 4.90* >4.01*      Imaging Studies: Dg Abd 1 View  Result Date: 01/17/2017 CLINICAL DATA:  Left lower quadrant pain EXAM: ABDOMEN - 1 VIEW COMPARISON:  06/26/2007 FINDINGS: Nonobstructed bowel gas pattern with large amount of stool throughout the colon. Vague opacities in the right lower quadrant could represent pill fragments or ingested material. IMPRESSION: Nonobstructed bowel gas pattern with large amount of stool in the colon Electronically Signed   By: Donavan Foil M.D.   On: 01/17/2017 19:30   Dg Chest Port 1 View  Result Date: 01/31/2017 CLINICAL DATA:  Cough EXAM: PORTABLE CHEST 1 VIEW COMPARISON:  01/15/2017 FINDINGS: Normal heart size. Focal opacity has developed at the medial right lung base. No pneumothorax. No pleural effusion. IMPRESSION: Right basilar atelectasis versus airspace disease. Followup PA and lateral chest X-ray is recommended in 3-4 weeks following  trial of antibiotic therapy to ensure resolution and exclude underlying malignancy. Electronically Signed   By: Marybelle Killings M.D.   On: 01/31/2017 16:52   Dg Chest Port 1 View  Result Date: 01/15/2017 CLINICAL DATA:  Shortness of breath. EXAM: PORTABLE CHEST 1 VIEW COMPARISON:  Radiographs January 14, 2017. FINDINGS: The heart size and mediastinal contours are within normal limits. No pneumothorax or pleural effusion is noted. Mild central pulmonary vascular congestion is noted. The visualized skeletal structures are unremarkable. IMPRESSION: Mild central pulmonary vascular congestion. Electronically Signed   By: Marijo Conception, M.D.   On: 01/15/2017 07:42   Dg Chest Port 1  View  Result Date: 01/14/2017 CLINICAL DATA:  Shortness of breath. EXAM: PORTABLE CHEST 1 VIEW COMPARISON:  Radiograph of January 05, 2017. FINDINGS: The heart size and mediastinal contours are within normal limits. Both lungs are clear. No pneumothorax or pleural effusion is noted. The visualized skeletal structures are unremarkable. IMPRESSION: No acute cardiopulmonary abnormality seen. Electronically Signed   By: Marijo Conception, M.D.   On: 01/14/2017 07:34   Dg Chest Port 1 View  Result Date: 01/05/2017 CLINICAL DATA:  Short of breath 4 days EXAM: PORTABLE CHEST 1 VIEW COMPARISON:  Radiograph 2126 FINDINGS: Normal cardiac silhouette. No effusion, infiltrate, or pneumothorax. Patient rotated rightward. No acute osseous abnormality. IMPRESSION: No acute cardiopulmonary process. Electronically Signed   By: Suzy Bouchard M.D.   On: 01/05/2017 09:58  [4 week]   Impression: 77 y/o male with history of A. fib on Coumadin, COPD, chronic kidney disease who presents with drop in hemoglobin, supratherapeutic INR, reported dark stools. Hemoglobin 11.6 at time of discharge on March 7, point-of-care testing yesterday hemoglobin was 8.3, in the ED was 10, down to 9 today. Heme-negative stool in the ED via DRE. No BM this admission. Patient is on chronic iron, reports dark stools for 2 years, denies recent nose bleeds, bleeding gums, rectal bleeding. Last EGD and colonoscopy in 2015 as outlined above.  At this point in time it is unclear whether he has true melena but there is a clear trend downward in his hemoglobin even in the setting of hydration based on prior values 2 weeks ago. In addition he has been on recent prednisone, aspirin along with Advil and is at increased risk for peptic ulcer disease/gastritis. Would consider upper endoscopy. Currently his INR remains greater than 4. He received 5 mg of IV vitamin K yesterday. Given no overt GI bleeding since admission, would hold off on upper endoscopy until  INR below 2.  Patient currently on Rocephin with questionable right basilar airspace disease.  Plan: 1. Continue IV PPI twice a day. 2. Consider upper endoscopy when INR closer to 1.8. Consider additional vitamin K given INR still greater than 4. 3. Continue to monitor H/H. Transfuse as needed.   We would like to thank you for the opportunity to participate in the care of [name].  Laureen Ochs. Bernarda Caffey [facility] [phone] 3/22/20189:10 AM   LOS: 0 days    Addendum: discussed with Dr. Gala Romney. Vit K 2.'5mg'$  SQ now. Recheck PT/INR in morning. NPO after midnight for possible EGD if INR 1.8 or less. Continue clear liquids for now.   Laureen Ochs. Bernarda Caffey [facility] [phone] 3/22/20182:02 PM

## 2017-02-01 NOTE — ACP (Advance Care Planning) (Signed)
Helped Don [name] complete his Living Will as requested and a copy was placed in his chart. He shared he did have a HCPOA. I asked if he could have a copy brought to the hospital so it could also be placed in his chart. He stated he would try to make that happen.

## 2017-02-02 ENCOUNTER — Encounter (HOSPITAL_COMMUNITY)
Admission: EM | Disposition: A | Discharge status: Home Health | Payer: Self-pay | Source: Home / Self Care | Attending: Family Medicine

## 2017-02-02 DIAGNOSIS — I4891 Unspecified atrial fibrillation: Secondary | ICD-10-CM

## 2017-02-02 DIAGNOSIS — K922 Gastrointestinal hemorrhage, unspecified: Principal | ICD-10-CM

## 2017-02-02 DIAGNOSIS — D689 Coagulation defect, unspecified: Secondary | ICD-10-CM

## 2017-02-02 DIAGNOSIS — K210 Gastro-esophageal reflux disease with esophagitis: Secondary | ICD-10-CM

## 2017-02-02 DIAGNOSIS — N390 Urinary tract infection, site not specified: Secondary | ICD-10-CM

## 2017-02-02 DIAGNOSIS — D649 Anemia, unspecified: Secondary | ICD-10-CM

## 2017-02-02 HISTORY — PX: ESOPHAGOGASTRODUODENOSCOPY: SHX5428

## 2017-02-02 LAB — HEMATOCRIT
HCT: 28.4 % — ABNORMAL LOW (ref 39.0–52.0)
HCT: 26.5 % — ABNORMAL LOW (ref 39.0–52.0)
HEMATOCRIT: 27.3 % — AB (ref 39.0–52.0)
HEMATOCRIT: 28.4 % — AB (ref 39.0–52.0)

## 2017-02-02 LAB — PROTIME-INR
INR: 2.08
Prothrombin Time: 23.7 seconds — ABNORMAL HIGH (ref 11.4–15.2)

## 2017-02-02 LAB — HEMOGLOBIN: Hemoglobin: 10 g/dL — ABNORMAL LOW (ref 13.0–17.0)

## 2017-02-02 SURGERY — EGD (ESOPHAGOGASTRODUODENOSCOPY)
Anesthesia: Moderate Sedation

## 2017-02-02 MED ORDER — WARFARIN - PHYSICIAN DOSING INPATIENT
Freq: Every day | Status: DC
  Administered 2017-02-02: 18:00:00

## 2017-02-02 MED ORDER — NYSTATIN 100000 UNIT/ML MT SUSP
5.0000 mL | Freq: Four times a day (QID) | OROMUCOSAL | Status: DC
  Administered 2017-02-02 – 2017-02-03 (×5): 500000 [IU] via ORAL
  Filled 2017-02-02 (×5): qty 5

## 2017-02-02 MED ORDER — MEPERIDINE HCL 100 MG/ML IJ SOLN
INTRAMUSCULAR | Status: AC
  Filled 2017-02-02: qty 2

## 2017-02-02 MED ORDER — WARFARIN SODIUM 2 MG PO TABS
4.0000 mg | ORAL_TABLET | Freq: Once | ORAL | Status: AC
  Administered 2017-02-02: 4 mg via ORAL
  Filled 2017-02-02: qty 2

## 2017-02-02 MED ORDER — MIDAZOLAM HCL 5 MG/5ML IJ SOLN
INTRAMUSCULAR | Status: DC | PRN
  Administered 2017-02-02 (×4): 1 mg via INTRAVENOUS

## 2017-02-02 MED ORDER — FENTANYL CITRATE (PF) 100 MCG/2ML IJ SOLN
INTRAMUSCULAR | Status: AC
  Filled 2017-02-02: qty 2

## 2017-02-02 MED ORDER — FENTANYL CITRATE (PF) 100 MCG/2ML IJ SOLN
INTRAMUSCULAR | Status: DC | PRN
  Administered 2017-02-02 (×2): 25 ug via INTRAVENOUS

## 2017-02-02 MED ORDER — LIDOCAINE VISCOUS 2 % MT SOLN
OROMUCOSAL | Status: AC
  Filled 2017-02-02: qty 15

## 2017-02-02 MED ORDER — LIDOCAINE VISCOUS 2 % MT SOLN
OROMUCOSAL | Status: DC | PRN
  Administered 2017-02-02: 1 via OROMUCOSAL

## 2017-02-02 MED ORDER — MIDAZOLAM HCL 5 MG/5ML IJ SOLN
INTRAMUSCULAR | Status: AC
  Filled 2017-02-02: qty 10

## 2017-02-02 MED ORDER — SODIUM CHLORIDE 0.9 % IV SOLN
INTRAVENOUS | Status: DC
  Administered 2017-02-02: 1000 mL via INTRAVENOUS

## 2017-02-02 MED ORDER — STERILE WATER FOR IRRIGATION IR SOLN
Status: DC | PRN
  Administered 2017-02-02: 2.5 mL

## 2017-02-02 MED ORDER — PANTOPRAZOLE SODIUM 40 MG PO TBEC
40.0000 mg | DELAYED_RELEASE_TABLET | Freq: Two times a day (BID) | ORAL | Status: DC
  Administered 2017-02-02 – 2017-02-03 (×3): 40 mg via ORAL
  Filled 2017-02-02 (×3): qty 1

## 2017-02-02 NOTE — Progress Notes (Signed)
PROGRESS NOTE    OLMAN YONO  VZC:[id number] DOB: 1941-04-22 DOA: 01/31/2017 PCP: Wardell Honour, MD    Brief Narrative:  Don [name] is an 76 y.o. male with hx of afib on chronic anticoagulation with Coumadin, hx of CKD III, anxiety, COPD, prior tobacco use, hx of prostate cancer, presented to the ER for black stool, and Hb of 8g per dL, and INR of 6 at the PCP's office.  He has been on iron supplement, and his stool guaic in the ER was negative.  He was given prednisone recently, and had taken ASA and advil per his account, along with being given augmentin recently.  In the ER, repeat Hb was 10 grams per dL, and repeat INR was 6.  His baseline Hb was 13 g per dL>  His platelet count was 91K,  And his WBC was 2.1K.  His UA was found to have microscopic hematuria, along with pyuria of 6-30 WBCs.  He was given IV rocephin.  He had colonsocpy twice, he said, and it was negative.  This was at the [place].  He said it was planned for him to have EGD.  Hospitalist was asked to admit him for UGIB, coagulopathy, UTI, and anemia. He denied current alcohol or tobacco use.   Assessment & Plan:   Principal Problem:   Chronic upper GI bleeding Active Problems:   Hypertension   Atrial fibrillation (HCC)   Paroxysmal atrial fibrillation (HCC)   Coagulopathy (HCC)   Upper GI bleed   Pancytopenia (HCC)   UTI (urinary tract infection)   INR (international normal ratio) abnormal   Normocytic anemia   Anemia   UGIB:   - stable hemodynamics - repeat Hb is stable - monitor H/H q12 - 2 doses of vitamin K given since admission - INR of 2.04 today - GI consulted- INR needs to be closer to 1.8 prior to EGD - hold iron   Supratherapeutic INR:   - given 2 doses of Vit K to correct - daily INR - INR of 2.04 today  Afib:   - Rate is controlled - continue his CCB.  UTI:   - continue with IV Rocephin given in the ER.   HTN:   - Stable.    Leukocytosis - He actually has pancytopenia -  possibly related to recent viral illness - repeat CBCD in am - will need outpatient workup    DVT prophylaxis: SCD.  Code Status: DNR  Family Communication: no family bedside Disposition Plan: Likely home pending GI workup    Consultants:   GI  PT  Procedures:   None  Antimicrobials:  None  Subjective: Patient seen prior to noon.  He is sleeping.  Says he feels good today and that he slept well.  Hoping to have his procedure (EGD) today.  No bowel movements yesterday or overnight.   Objective: Vitals:   02/01/17 2110 02/02/17 0533 02/02/17 0734 02/02/17 0737  BP: 129/64 128/71    Pulse: 86 (!) 103    Resp: 18 20    Temp: 98.8 F (37.1 C) 99 F (37.2 C)    TempSrc: Oral Oral    SpO2: 97% 93% 92% 92%  Weight:      Height:        Intake/Output Summary (Last 24 hours) at 02/02/17 1312 Last data filed at 02/02/17 1012  Gross per 24 hour  Intake                0 ml  Output              160 ml  Net             -160 ml   Filed Weights   01/31/17 1604 01/31/17 2208  Weight: 75.8 kg (167 lb) 74.5 kg (164 lb 4.8 oz)    Examination:  General exam: Appears calm and comfortable  Respiratory system: Clear to auscultation. Respiratory effort normal. Cardiovascular system: S1 & S2 heard, RRR. No JVD, murmurs, rubs, gallops or clicks. No pedal edema. Gastrointestinal system: Abdomen is nondistended, soft and minimally tender in the suprapubic area. No organomegaly or masses felt. Normal bowel sounds heard. Central nervous system: Alert and oriented. No focal neurological deficits. Extremities: Symmetric 5 x 5 power. Skin: No rashes, lesions or ulcers Psychiatry: Mood & affect appropriate.     Data Reviewed: I have personally reviewed following labs and imaging studies  CBC:  Recent Labs Lab 01/31/17 1621  02/01/17 0351 02/01/17 0941 02/01/17 1543 02/01/17 2208 02/02/17 0404 02/02/17 1044  WBC 2.1*  --  2.3*  --   --   --   --   --   HGB 10.0*  < > 9.0*  8.9* 9.0* 9.1* 10.0*  --   HCT 29.0*  < > 25.7* 25.9* 26.1* 26.0* 28.4* 27.3*  MCV 98.0  --  97.0  --   --   --   --   --   PLT 91*  --  94*  --   --   --   --   --   < > = values in this interval not displayed. Basic Metabolic Panel:  Recent Labs Lab 01/31/17 1621  NA 135  K 4.5  CL 103  CO2 26  GLUCOSE 192*  BUN 41*  CREATININE 2.50*  CALCIUM 7.7*   GFR: Estimated Creatinine Clearance: 24.7 mL/min (A) (by C-G formula based on SCr of 2.5 mg/dL (H)). Liver Function Tests:  Recent Labs Lab 01/31/17 1621  AST 26  ALT 46  ALKPHOS 91  BILITOT 0.7  PROT 5.3*  ALBUMIN 2.5*   No results for input(s): LIPASE, AMYLASE in the last 168 hours. No results for input(s): AMMONIA in the last 168 hours. Coagulation Profile:  Recent Labs Lab 01/31/17 1431 01/31/17 1636 02/01/17 0351 02/02/17 0404  INR 6.4* 4.90* >4.01* 2.08   Cardiac Enzymes: No results for input(s): CKTOTAL, CKMB, CKMBINDEX, TROPONINI in the last 168 hours. BNP (last 3 results) No results for input(s): PROBNP in the last 8760 hours. HbA1C: No results for input(s): HGBA1C in the last 72 hours. CBG: No results for input(s): GLUCAP in the last 168 hours. Lipid Profile: No results for input(s): CHOL, HDL, LDLCALC, TRIG, CHOLHDL, LDLDIRECT in the last 72 hours. Thyroid Function Tests: No results for input(s): TSH, T4TOTAL, FREET4, T3FREE, THYROIDAB in the last 72 hours. Anemia Panel: No results for input(s): VITAMINB12, FOLATE, FERRITIN, TIBC, IRON, RETICCTPCT in the last 72 hours. Sepsis Labs: No results for input(s): PROCALCITON, LATICACIDVEN in the last 168 hours.  No results found for this or any previous visit (from the past 240 hour(s)).       Radiology Studies: Dg Chest Port 1 View  Result Date: 01/31/2017 CLINICAL DATA:  Cough EXAM: PORTABLE CHEST 1 VIEW COMPARISON:  01/15/2017 FINDINGS: Normal heart size. Focal opacity has developed at the medial right lung base. No pneumothorax. No pleural  effusion. IMPRESSION: Right basilar atelectasis versus airspace disease. Followup PA and lateral chest X-ray is recommended in 3-4 weeks  following trial of antibiotic therapy to ensure resolution and exclude underlying malignancy. Electronically Signed   By: Marybelle Killings M.D.   On: 01/31/2017 16:52        Scheduled Meds: . atorvastatin  20 mg Oral Daily  . bisoprolol  10 mg Oral BID  . cefTRIAXone (ROCEPHIN)  IV  1 g Intravenous Q24H  . diltiazem  180 mg Oral BID  . DULoxetine  60 mg Oral Daily  . mometasone-formoterol  2 puff Inhalation BID  . nystatin  5 mL Oral QID  . omega-3 acid ethyl esters  1 g Oral Daily  . pantoprazole (PROTONIX) IV  40 mg Intravenous Q12H  . sodium chloride flush  3 mL Intravenous Q12H  . tiotropium  18 mcg Inhalation Daily   Continuous Infusions: . dextrose 5 % and 0.9% NaCl 50 mL/hr at 02/02/17 0024     LOS: 1 day    Time spent: 35 minutes    Loretha Stapler, MD [facility] Pager [phone]  If 7PM-7AM, please contact night-coverage [email or web address] Password [email or web address] 02/02/2017, 1:12 PM

## 2017-02-02 NOTE — H&P (Addendum)
Primary Care Physician:  Wardell Honour, MD Primary Gastroenterologist:  Dr. Oneida Alar  Pre-Procedure History & Physical: HPI:  Don [name] is a 76 y.o. male here for GI BLEED/ANEMIA.  Past Medical History:  Diagnosis Date  . Acute bronchitis 04/03/2014  . Anxiety   . Atrial fibrillation ([place])   . Cataract   . COPD (chronic obstructive pulmonary disease) ([place])   . Essential hypertension   . Hyperlipidemia   . Macular degeneration   . Noncompliance with medications 04/2013   Xarelto, digoxin previously  . Pre-diabetes   . Prostate cancer ([place])   . Stage III chronic kidney disease 04/03/2014  . Tubular adenoma of colon 07/31/02, 11/18/03    Past Surgical History:  Procedure Laterality Date  . Anal abcess,Hemorroids,    . COLONOSCOPY N/A 04/05/2014   Procedure: COLONOSCOPY;  Surgeon: Ladene Artist, MD;  Location: [facility];  Service: Endoscopy;  Laterality: N/A;  . COLONOSCOPY W/ BIOPSIES  11/18/2003   Dr. Earle Gell  . ESOPHAGOGASTRODUODENOSCOPY  11/18/2003   Dr. Earle Gell  . ESOPHAGOGASTRODUODENOSCOPY N/A 04/05/2014   Procedure: ESOPHAGOGASTRODUODENOSCOPY (EGD);  Surgeon: Ladene Artist, MD;  Location: [facility];  Service: Endoscopy;  Laterality: N/A;  . RETROPUBIC PROSTATECTOMY  11/26/2001  . TONSILLECTOMY      Prior to Admission medications   Medication Sig Start Date End Date Taking? Authorizing Provider  albuterol (PROVENTIL HFA;VENTOLIN HFA) 108 (90 Base) MCG/ACT inhaler Inhale 2 puffs into the lungs every 6 (six) hours as needed for wheezing or shortness of breath. 01/18/17  Yes Eber Jones, MD  ALLERGY RELIEF 10 MG tablet TAKE 1 TABLET DAILY FOR ALLERGY Patient taking differently: TAKE 1 TABLET DAILY FOR ALLERGY,    Loratidine 01/04/17  Yes Timmothy Euler, MD  amoxicillin-clavulanate (AUGMENTIN) 875-125 MG tablet Take 1 tablet by mouth 2 (two) times daily. 01/26/17  Yes Sharion Balloon, FNP  atorvastatin (LIPITOR) 20 MG tablet TAKE 1 TABLET  DAILY 12/15/16  Yes Wardell Honour, MD  bisoprolol (ZEBETA) 10 MG tablet Take 1 tablet (10 mg total) by mouth 2 (two) times daily. 01/18/17  Yes Eber Jones, MD  budesonide-formoterol [facility]) 160-4.5 MCG/ACT inhaler Inhale 2 puffs into the lungs 2 (two) times daily. 02/10/16  Yes Wardell Honour, MD  clonazePAM (KLONOPIN) 1 MG tablet TAKE 1/2 TABLET ONCE DAILY AS NEEDED FOR ANXIETY 01/26/17  Yes Wardell Honour, MD  diltiazem (CARDIZEM CD) 180 MG 24 hr capsule Take 1 capsule (180 mg total) by mouth 2 (two) times daily. 01/18/17  Yes Eber Jones, MD  doxylamine, Sleep, (UNISOM) 25 MG tablet Take 25 mg by mouth at bedtime as needed.   Yes Historical Provider, MD  DULoxetine (CYMBALTA) 60 MG capsule TAKE (1) CAPSULE DAILY 11/20/16  Yes Terald Sleeper, PA-C  FERREX 150 150 MG capsule Take 1 capsule by mouth daily. 07/08/15  Yes Historical Provider, MD  Incontinence Supply Disposable (INCONTINENCE BRIEF LARGE) MISC Use as needed for urinary incontinence.  Dx: urinary incontinence R32 and prostate cancer C61 09/19/16  Yes Wardell Honour, MD  Multiple Vitamin (MULTIVITAMIN WITH MINERALS) TABS Take 1 tablet by mouth daily.   Yes Historical Provider, MD  nystatin (MYCOSTATIN) 100000 UNIT/ML suspension Take 5 mLs (500,000 Units total) by mouth 4 (four) times daily. 01/26/17  Yes Sharion Balloon, FNP  omega-3 acid ethyl esters (LOVAZA) 1 G capsule TAKE (2) CAPSULES TWICE DAILY. 07/28/15  Yes Wardell Honour, MD  pantoprazole (PROTONIX) 40 MG tablet TAKE  1 TABLET DAILY 12/02/16  Yes Chipper Herb, MD  Polyethyl Glycol-Propyl Glycol (SYSTANE OP) Place 1 drop into both eyes 2 (two) times daily as needed (dry eyes).    Yes Historical Provider, MD  polyethylene glycol (MIRALAX / GLYCOLAX) packet Take 17 g by mouth 2 (two) times daily. 01/18/17  Yes Eber Jones, MD  predniSONE (DELTASONE) 20 MG tablet 2 tabs PO X4 days then 1.5tabs PO daily x4 days then 1tab PO daily x4 days then 0.5tabs PO daily  x4 days 01/18/17  Yes Eber Jones, MD  Probiotic Product (PRO-BIOTIC BLEND) CAPS Take 1 capsule by mouth daily. 07/23/15  Yes Cherre Robins, PharmD  terazosin (HYTRIN) 2 MG capsule TAKE 1 CAPSULE AT BEDTIME 09/20/16  Yes Wardell Honour, MD  tiotropium (SPIRIVA) 18 MCG inhalation capsule Place 1 capsule (18 mcg total) into inhaler and inhale daily. 01/18/17  Yes Eber Jones, MD  triamcinolone cream (KENALOG) 0.1 % Apply 1 application topically 2 (two) times daily. 11/10/16  Yes Cherre Robins, PharmD  warfarin (COUMADIN) 4 MG tablet Take 1 and 1/2 tablets (= '6mg'$ ) daily except on Sundays and Thursdays take 1 tablet (='4mg'$ ) 01/04/17  Yes Wardell Honour, MD  nitroGLYCERIN (NITROSTAT) 0.4 MG SL tablet Place 1 tablet (0.4 mg total) under the tongue every 5 (five) minutes as needed for chest pain. Patient not taking: Reported on 01/26/2017 02/10/16   Wardell Honour, MD    Allergies as of 01/31/2017 - Review Complete 01/31/2017  Allergen Reaction Noted  . Wellbutrin [bupropion] Anxiety 03/21/2013    Family History  Problem Relation Age of Onset  . Diabetes Father   . Heart disease Father     MI  . Heart attack Father   . Heart attack Mother   . Heart disease Brother   . Cancer Brother     lung  . Lung cancer Brother   . Heart disease Brother   . Lung cancer Sister   . Cancer Sister     lung  . Heart disease Brother   . Heart disease Brother    Social History   Social History  . Marital status: Married    Spouse name: N/A  . Number of children: 0  . Years of education: N/A   Occupational History  . retired    Social History Main Topics  . Smoking status: Former Smoker    Packs/day: 1.00    Years: 62.00    Types: Cigarettes, Cigars    Quit date: 12/28/2016  . Smokeless tobacco: Never Used  . Alcohol use No  . Drug use: No  . Sexual activity: No   Other Topics Concern  . 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 Married to his second wife, she is in a nursing home.   Review of Systems: See HPI, otherwise negative ROS  Physical Exam: BP (!) 156/82   Pulse (!) 104   Temp 98.7 F (37.1 C) (Oral)   Resp 18   Ht '5\' 9"'$  (1.753 m)   Wt 164 lb 4.8 oz (74.5 kg)   SpO2 98%   BMI 24.26 kg/m  General:   Alert,  pleasant and cooperative in NAD Head:  Normocephalic and atraumatic. Neck:  Supple; Lungs:  Clear throughout to auscultation.    Heart:  Regular rate and IRREGULAR rhythm. Abdomen:  Soft, nontender and nondistended. Normal bowel sounds, without guarding, and without  rebound.   Neurologic:  Alert and  oriented x4;  NO  NEW FOCAL DEFICITS  Impression/Plan:    GI BLEED/ANEMIA  PLAN: EGD TODAY. DISCUSSED PROCEDURE, BENEFITS, & RISKS: < 1% chance of medication reaction, bleeding,  OR perforation.

## 2017-02-02 NOTE — Op Note (Addendum)
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                            ADMITTED ON ABX, COUMADIN, AND PROTONIX DAILY.                            PMHx: PROSTATE CANCER. Providers:                Barney Drain, MD, Janeece Riggers, RN, Charlyne Petrin                            RN, RN Referring MD:             Lillette Boxer. Miller Medicines:                Fentanyl 50 micrograms IV, Midazolam 4 mg IV Complications:            No immediate complications. Estimated Blood Loss:     Estimated blood loss was minimal. Procedure:                Pre-Anesthesia Assessment:                           - Prior to the procedure, a History and Physical                            was performed, and patient medications and                            allergies were reviewed. [name] tolerance of                            previous anesthesia was also reviewed. The risks                            and benefits of the procedure and the sedation                            options and risks were discussed with the patient.  All questions were answered, and informed consent                            was obtained. Prior Anticoagulants: The patient has                            taken Coumadin (warfarin), last dose was 2 days                            prior to procedure. ASA Grade Assessment: III - A                            patient with severe systemic disease.  After                            reviewing the risks and benefits, the patient was                            deemed in satisfactory condition to undergo the                            procedure. After obtaining informed consent, the                            endoscope was passed under direct vision.                            Throughout the procedure, the patient's blood                            pressure, pulse, and oxygen saturations were                            monitored continuously. The [id number] ([id number])                            scope was introduced through the mouth, and                            advanced to the second part of duodenum. The upper                            GI endoscopy was accomplished without difficulty.                            The patient tolerated the procedure well. Scope In: 2:52:11 PM Scope Out: 3:03:09 PM Total Procedure Duration: 0 hours 10 minutes 58 seconds  Findings:      LA Grade B (one or more mucosal breaks greater than 5 mm, not extending       between the tops of two mucosal folds) esophagitis with SMALL AMOUNT OF       bleeding was found.      Patchy mild inflammation characterized by congestion (edema) and       erythema  was found in the gastric body and in the gastric antrum.       Biopsies were taken with a cold forceps for Helicobacter pylori testing.      One moderate (circumferential scarring or stenosis; an endoscope may       pass) benign-appearing, intrinsic stenosis was found. This measured 1.4       cm (inner diameter) and was traversed.      A small non-bleeding diverticulum was found in the second portion of the       duodenum.      The duodenal bulb was normal. Impression:               - ACUTE DROP IN HEMOGLOBIN MOST LIKELY DUE TO                            ESOPHAGITIS/POSSIBLY GASTRITIS IN SETTING OF                            SUPRATHERAPEUTIC INR                           - MILD Gastritis.                            - PEPTIC STRICTURE                           - Non-bleeding duodenal diverticulum. Moderate Sedation:      Moderate (conscious) sedation was administered by the endoscopy nurse       and supervised by the endoscopist. The following parameters were       monitored: oxygen saturation, heart rate, blood pressure, and response       to care. Total physician intraservice time was 23 minutes. Recommendation:           - Await pathology results. RE-START COUMADIN 4 MG                            DAILY TONIGHT. MONITOR FOR MELENA/RECTAL BLEEDING.                           - Use Protonix (pantoprazole) 40 mg PO BID.                           - Soft diet. HOLD ORAL IRON FOR 2 WEEKS.                           -IF PT DEVELOPS MELENA AND/OR TRANSFUSION DEPENDENT                            ANEMIA, RECOMMEND GIVENS CAPSULE STUDY PLACED VIA                            EGD.                           - Return patient to hospital ward for ongoing care.                           -  Continue present medications. Procedure Code(s):        --- Professional ---                           [phone], Esophagogastroduodenoscopy, flexible,                            transoral; with biopsy, single or multiple                           99152, Moderate sedation services provided by the                            same physician or other qualified health care                            professional performing the diagnostic or                            therapeutic service that the sedation supports,                            requiring the presence of an independent trained                            observer to assist in the monitoring of the                            patient's level of consciousness and physiological                            status; initial 15 minutes of intraservice time,                            patient age 4 years or older                           [phone], Moderate sedation services; each additional                             15 minutes intraservice time Diagnosis Code(s):        --- Professional ---                           K21.0, Gastro-esophageal reflux disease with                            esophagitis                           K29.70, Gastritis, unspecified, without bleeding                           K22.2, Esophageal obstruction                           D50.0,  Iron deficiency anemia secondary to blood                            loss (chronic)                           K57.10, Diverticulosis of small intestine without                            perforation or abscess without bleeding CPT copyright 2016 American Medical Association. All rights reserved. The codes documented in this report are preliminary and upon coder review may  be revised to meet current compliance requirements. Barney Drain, MD Barney Drain, MD 02/02/2017 3:38:48 PM This report has been signed electronically. Number of Addenda: 0

## 2017-02-02 NOTE — Care Management Note (Addendum)
Case Management Note  Patient Details  Name: Don [name] MRN: [id number] Date of Birth: May 17, 1941  Subjective/Objective:                  Pt from home, lives alone and has POA "[name]". Pt active with [facility] for nursing, pt, aid, dx management. Pt ind with ADL's, uses no DME. Pt has PCP, transportation to appointments and has no difficulty affording or managing medications. Pt aware HH has 48hrs to resume services at [place].  Action/Plan: Pt plans to return home with resumption of [place] services at DC. Jermaine, [facility] rep, aware of admission and will be updated on DC. Rep will obtain info from chart. Pt will need order to resume services. Pt enrolled in [facility] transition calls for general DC.  Expected Discharge Date:       02/05/2017           Expected Discharge Plan:  [place]  In-House Referral:  NA  Discharge planning Services  CM Consult  Post Acute Care Choice:  Home Health Choice offered to:  Patient  HH Arranged:  RN, PT, OT [facility] Agency:  [place]  Status of Service:  In process, will continue to follow  Sherald Barge, RN 02/02/2017, 10:51 AM

## 2017-02-02 NOTE — Progress Notes (Signed)
Subjective: Feeling better today. No BM or GI bleed in the last 24 hours. Has chronic cough. Denies abdominal pain, N/V. No other upper or lower GI symptoms.  Objective: Vital signs in last 24 hours: Temp:  [98.3 F (36.8 C)-99 F (37.2 C)] 99 F (37.2 C) (03/23 0533) Pulse Rate:  [86-103] 103 (03/23 0533) Resp:  [18-20] 20 (03/23 0533) BP: (128-149)/(64-90) 128/71 (03/23 0533) SpO2:  [92 %-99 %] 92 % (03/23 0737) Last BM Date: 01/30/17 General:   Alert and oriented, pleasant Head:  Normocephalic and atraumatic. Eyes:  No icterus, sclera clear. Conjuctiva pink.  Heart:  Irregularly irregular, no murmurs noted.  Lungs: Bilateral rhonchi noted without wheezing or rales. Chronic cough noted during visit. Abdomen:  Bowel sounds present, soft, non-tender, non-distended. No HSM or hernias noted. No rebound or guarding. No masses appreciated  Msk:  Symmetrical without gross deformities. Neurologic:  Alert and  oriented x4;  grossly normal neurologically. Psych:  Alert and cooperative. Normal mood and affect.  Intake/Output from previous day: 03/22 0701 - 03/23 0700 In: 480 [P.O.:480] Out: -  Intake/Output this shift: No intake/output data recorded.  Lab Results:  Recent Labs  01/31/17 1621  02/01/17 0351  02/01/17 1543 02/01/17 2208 02/02/17 0404  WBC 2.1*  --  2.3*  --   --   --   --   HGB 10.0*  < > 9.0*  < > 9.0* 9.1* 10.0*  HCT 29.0*  < > 25.7*  < > 26.1* 26.0* 28.4*  PLT 91*  --  94*  --   --   --   --   < > = values in this interval not displayed. BMET  Recent Labs  01/31/17 1621  NA 135  K 4.5  CL 103  CO2 26  GLUCOSE 192*  BUN 41*  CREATININE 2.50*  CALCIUM 7.7*   LFT  Recent Labs  01/31/17 1621  PROT 5.3*  ALBUMIN 2.5*  AST 26  ALT 46  ALKPHOS 91  BILITOT 0.7   PT/INR  Recent Labs  02/01/17 0351 02/02/17 0404  LABPROT 55.4* 23.7*  INR >4.01* 2.08   Hepatitis Panel No results for input(s): HEPBSAG, HCVAB, HEPAIGM, HEPBIGM in the  last 72 hours.   Studies/Results: Dg Chest Port 1 View  Result Date: 01/31/2017 CLINICAL DATA:  Cough EXAM: PORTABLE CHEST 1 VIEW COMPARISON:  01/15/2017 FINDINGS: Normal heart size. Focal opacity has developed at the medial right lung base. No pneumothorax. No pleural effusion. IMPRESSION: Right basilar atelectasis versus airspace disease. Followup PA and lateral chest X-ray is recommended in 3-4 weeks following trial of antibiotic therapy to ensure resolution and exclude underlying malignancy. Electronically Signed   By: Marybelle Killings M.D.   On: 01/31/2017 16:52    Assessment: 76 y/o male with history of A. fib on Coumadin, COPD, chronic kidney disease who presented with drop in hemoglobin, supratherapeutic INR, reported dark stools. Hemoglobin 11.6 at time of discharge on March 7, point-of-care testing yesterday hemoglobin was 8.3, in the ED was 10, down to 9 yesterday. Heme-negative stool in the ED via DRE. Patient is on chronic iron, reports dark stools for 2 years, denies recent nose bleeds, bleeding gums, rectal bleeding. Last EGD and colonoscopy in 2015 which found: variable Z line, biopsy negative for Barrett's more consistent with reflux, small hiatal hernia, 6 mm sessile polyp removed from the sigmoid colon (tubular adenoma), internal hemorrhoids.  At this point in time it is unclear whether he has true melena but  there is a clear trend downward in his hemoglobin even in the setting of hydration based on prior values 2 weeks ago. In addition he has been on recent prednisone, aspirin along with Advil and is at increased risk for peptic ulcer disease/gastritis. Previously recommended to consider upper endoscopy but his INR remained greater than 4. He received 5 mg of IV vitamin K 2 days ago and another 2.5 yesterday. Given no overt GI bleeding since admission, would hold off on upper endoscopy until INR below 2.  Patient currently on Rocephin with questionable right basilar airspace  disease.  Today he states he's doing well. Hgb improved to 10 this morning. INR 2.08 this morning. Has not had a bowel movement since yesterday, no obvious bleeding.  Plan: 1. Previous plan for EGD when INR closer to 1.8; will check with endoscopist given INR 2.08 2. Monitor for recurrent GI bleed 3. Transfuse as necessary 4. Monitor Hgb 5. NSAID avoidance    LOS: 1 day    02/02/2017, 9:10 AM

## 2017-02-03 LAB — BASIC METABOLIC PANEL
Anion gap: 7 (ref 5–15)
BUN: 23 mg/dL — ABNORMAL HIGH (ref 6–20)
CHLORIDE: 107 mmol/L (ref 101–111)
CO2: 22 mmol/L (ref 22–32)
CREATININE: 2.27 mg/dL — AB (ref 0.61–1.24)
Calcium: 7.7 mg/dL — ABNORMAL LOW (ref 8.9–10.3)
GFR calc Af Amer: 30 mL/min — ABNORMAL LOW (ref >60)
GFR calc non Af Amer: 26 mL/min — ABNORMAL LOW (ref >60)
GLUCOSE: 154 mg/dL — AB (ref 65–99)
Potassium: 4 mmol/L (ref 3.5–5.1)
Sodium: 136 mmol/L (ref 135–145)

## 2017-02-03 LAB — CBC
HCT: 27.1 % — ABNORMAL LOW (ref 39.0–52.0)
HEMOGLOBIN: 9.6 g/dL — AB (ref 13.0–17.0)
MCH: 34 pg (ref 26.0–34.0)
MCHC: 35.4 g/dL (ref 30.0–36.0)
MCV: 96.1 fL (ref 78.0–100.0)
Platelets: 149 10*3/uL — ABNORMAL LOW (ref 150–400)
RBC: 2.82 MIL/uL — ABNORMAL LOW (ref 4.22–5.81)
RDW: 13.8 % (ref 11.5–15.5)
WBC: 2.9 10*3/uL — ABNORMAL LOW (ref 4.0–10.5)

## 2017-02-03 LAB — HEMATOCRIT: HEMATOCRIT: 26.1 % — AB (ref 39.0–52.0)

## 2017-02-03 LAB — PROTIME-INR
INR: 1.37
Prothrombin Time: 17 s — ABNORMAL HIGH (ref 11.4–15.2)

## 2017-02-03 MED ORDER — PANTOPRAZOLE SODIUM 40 MG PO TBEC: 40.0000 mg | DELAYED_RELEASE_TABLET | Freq: Two times a day (BID) | ORAL | 2 refills | Status: DC

## 2017-02-03 MED ORDER — WARFARIN SODIUM 5 MG PO TABS
5.0000 mg | ORAL_TABLET | Freq: Once | ORAL | Status: AC
  Administered 2017-02-03: 5 mg via ORAL
  Filled 2017-02-03: qty 1

## 2017-02-03 MED ORDER — WARFARIN - PHARMACIST DOSING INPATIENT
Status: DC
  Administered 2017-02-03: 16:00:00

## 2017-02-03 NOTE — Discharge Summary (Signed)
Physician Discharge Summary  Don [name] RWE:[id number] DOB: [birth year] DOA: 01/31/2017  PCP: Wardell Honour, MD  Admit date: 01/31/2017 Discharge date: 02/03/2017  Admitted From: Home  Disposition:  Home   Recommendations for Outpatient Follow-up:  1. Follow up with PCP in 1-2 weeks 2. Schedule follow up with GI within 1-2 weeks 3. Please obtain BMP/CBC in one week 4. Get INR checked in 3 days 5. Continue taking coumadin as prescribed 6. Increase Protonix to twice a day 7. Home Health to be re-established for you 8. Stay off iron for the next two weeks 9. Discuss with PCP retesting urine to ensure hematuria cleared after treatment for UTI   Home Health:Yes- PT/OT/RN  Equipment/Devices: None   Discharge Condition: Stable CODE STATUS: DNR Diet recommendation: Heart Healthy Diet  Brief/Interim Summary: Don Dacanay Griffinis an 76 y.o.malewith hx of afib on chronic anticoagulation with Coumadin, hx of CKD III, anxiety, COPD, prior tobacco use, hx of prostate cancer, presented to the ER for black stool, and Hb of 8g per dL, and INR of 6 at the PCP's office. He has been on iron supplement, and his stool guaic in the ER was negative. He was given prednisone recently, and had taken ASA and advil per his account, along with being given augmentin recently. In the ER, repeat Hb was 10 grams per dL, and repeat INR was 6. His baseline Hb was 13 g per dL>His platelet count was 91K, And his WBC was 2.1K. His UA was found to have microscopic hematuria, along with pyuria of 6-30 WBCs. He was given IV rocephin. He had colonsocpy twice, he said, and it was negative. This was at the [place]. He said it was planned for him to have EGD. Hospitalist was asked to admit him for UGIB, coagulopathy, UTI, and anemia. He denied current alcohol or tobacco use.  Discharge Diagnoses:  Principal Problem:   Chronic upper GI bleeding Active Problems:   Hypertension   Atrial fibrillation (HCC)    Paroxysmal atrial fibrillation (HCC)   Coagulopathy (HCC)   Upper GI bleed   Pancytopenia (HCC)   UTI (urinary tract infection)   INR (international normal ratio) abnormal   Normocytic anemia   Anemia  UGIB:  - stable hemodynamics - serial H/H stable - 2 doses of vitamin K given since admission - coumadin restarted last night - GI consulted- patient will need outpatient follow up - patient instructed to return to the hospital with any return of black or tarry stools - hold iron for 2 weeks  Supratherapeutic INR:  - given 2 doses of Vit K to correct - patient to resume previous coumadin dosing and will need INR testing in 3 days  Afib:  - Rate is controlled - continue his CCB.  UTI:  - Leukocyte esterase negative and nitrite negative - will need repeat UA to ensure hematuria is cleared  HTN:  - Stable.   Leukocytosis - He actually has pancytopenia - possibly related to recent viral illness  Discharge Instructions  Discharge Instructions    Call MD for:  difficulty breathing, headache or visual disturbances    Complete by:  As directed    Call MD for:  extreme fatigue    Complete by:  As directed    Call MD for:  hives    Complete by:  As directed    Call MD for:  persistant dizziness or light-headedness    Complete by:  As directed    Call MD for:  persistant  nausea and vomiting    Complete by:  As directed    Call MD for:  severe uncontrolled pain    Complete by:  As directed    Call MD for:  temperature >100.4    Complete by:  As directed    Diet - low sodium heart healthy    Complete by:  As directed    Discharge instructions    Complete by:  As directed    Increase your protonix to twice a day Stay off iron for two weeks Resume coumadin per your usual schedule Return to the hospital with any black or tarry stools   Increase activity slowly    Complete by:  As directed      Allergies as of 02/03/2017      Reactions   Wellbutrin [bupropion]  Anxiety      Medication List    STOP taking these medications   nitroGLYCERIN 0.4 MG SL tablet Commonly known as:  NITROSTAT   predniSONE 20 MG tablet Commonly known as:  DELTASONE     TAKE these medications   albuterol 108 (90 Base) MCG/ACT inhaler Commonly known as:  PROVENTIL HFA;VENTOLIN HFA Inhale 2 puffs into the lungs every 6 (six) hours as needed for wheezing or shortness of breath.   ALLERGY RELIEF 10 MG tablet Generic drug:  loratadine TAKE 1 TABLET DAILY FOR ALLERGY What changed:  See the new instructions.   amoxicillin-clavulanate 875-125 MG tablet Commonly known as:  AUGMENTIN Take 1 tablet by mouth 2 (two) times daily.   atorvastatin 20 MG tablet Commonly known as:  LIPITOR TAKE 1 TABLET DAILY   bisoprolol 10 MG tablet Commonly known as:  ZEBETA Take 1 tablet (10 mg total) by mouth 2 (two) times daily.   budesonide-formoterol 160-4.5 MCG/ACT inhaler Commonly known as:  SYMBICORT Inhale 2 puffs into the lungs 2 (two) times daily.   clonazePAM 1 MG tablet Commonly known as:  KLONOPIN TAKE 1/2 TABLET ONCE DAILY AS NEEDED FOR ANXIETY   diltiazem 180 MG 24 hr capsule Commonly known as:  CARDIZEM CD Take 1 capsule (180 mg total) by mouth 2 (two) times daily.   doxylamine (Sleep) 25 MG tablet Commonly known as:  UNISOM Take 25 mg by mouth at bedtime as needed.   DULoxetine 60 MG capsule Commonly known as:  CYMBALTA TAKE (1) CAPSULE DAILY   FERREX 150 150 MG capsule Generic drug:  iron polysaccharides Take 1 capsule by mouth daily.   Incontinence Brief Large Misc Use as needed for urinary incontinence.  Dx: urinary incontinence R32 and prostate cancer C61   multivitamin with minerals Tabs tablet Take 1 tablet by mouth daily.   nystatin 100000 UNIT/ML suspension Commonly known as:  MYCOSTATIN Take 5 mLs (500,000 Units total) by mouth 4 (four) times daily.   omega-3 acid ethyl esters 1 g capsule Commonly known as:  LOVAZA TAKE (2) CAPSULES  TWICE DAILY.   pantoprazole 40 MG tablet Commonly known as:  PROTONIX Take 1 tablet (40 mg total) by mouth 2 (two) times daily. What changed:  See the new instructions.   polyethylene glycol packet Commonly known as:  MIRALAX / GLYCOLAX Take 17 g by mouth 2 (two) times daily.   PRO-BIOTIC BLEND Caps Take 1 capsule by mouth daily.   SYSTANE OP Place 1 drop into both eyes 2 (two) times daily as needed (dry eyes).   terazosin 2 MG capsule Commonly known as:  HYTRIN TAKE 1 CAPSULE AT BEDTIME   tiotropium 18 MCG  inhalation capsule Commonly known as:  SPIRIVA Place 1 capsule (18 mcg total) into inhaler and inhale daily.   triamcinolone cream 0.1 % Commonly known as:  KENALOG Apply 1 application topically 2 (two) times daily.   warfarin 4 MG tablet Commonly known as:  COUMADIN Take 1 and 1/2 tablets (= '6mg'$ ) daily except on Sundays and Thursdays take 1 tablet (='4mg'$ )      Follow-up Information    [facility].   Contact information: [address] [place] [address] [phone]        Wardell Honour, MD. Schedule an appointment as soon as possible for a visit in 2 week(s).   Specialty:  Family Medicine Contact information: [place] [address] [phone]        Barney Drain, MD. Schedule an appointment as soon as possible for a visit in 2 week(s).   Specialty:  Gastroenterology Contact information: [place] [address] [phone]          Allergies  Allergen Reactions  . Wellbutrin [Bupropion] Anxiety    Consultations:  Gastroenterology  PT  CM   Procedures/Studies: Dg Abd 1 View  Result Date: 01/17/2017 CLINICAL DATA:  Left lower quadrant pain EXAM: ABDOMEN - 1 VIEW COMPARISON:  06/26/2007 FINDINGS: Nonobstructed bowel gas pattern with large amount of stool throughout the colon. Vague opacities in the right lower quadrant could represent pill fragments or ingested  material. IMPRESSION: Nonobstructed bowel gas pattern with large amount of stool in the colon Electronically Signed   By: Donavan Foil M.D.   On: 01/17/2017 19:30   Dg Chest Port 1 View  Result Date: 01/31/2017 CLINICAL DATA:  Cough EXAM: PORTABLE CHEST 1 VIEW COMPARISON:  01/15/2017 FINDINGS: Normal heart size. Focal opacity has developed at the medial right lung base. No pneumothorax. No pleural effusion. IMPRESSION: Right basilar atelectasis versus airspace disease. Followup PA and lateral chest X-ray is recommended in 3-4 weeks following trial of antibiotic therapy to ensure resolution and exclude underlying malignancy. Electronically Signed   By: Marybelle Killings M.D.   On: 01/31/2017 16:52   Dg Chest Port 1 View  Result Date: 01/15/2017 CLINICAL DATA:  Shortness of breath. EXAM: PORTABLE CHEST 1 VIEW COMPARISON:  Radiographs January 14, 2017. FINDINGS: The heart size and mediastinal contours are within normal limits. No pneumothorax or pleural effusion is noted. Mild central pulmonary vascular congestion is noted. The visualized skeletal structures are unremarkable. IMPRESSION: Mild central pulmonary vascular congestion. Electronically Signed   By: Marijo Conception, M.D.   On: 01/15/2017 07:42   Dg Chest Port 1 View  Result Date: 01/14/2017 CLINICAL DATA:  Shortness of breath. EXAM: PORTABLE CHEST 1 VIEW COMPARISON:  Radiograph of January 05, 2017. FINDINGS: The heart size and mediastinal contours are within normal limits. Both lungs are clear. No pneumothorax or pleural effusion is noted. The visualized skeletal structures are unremarkable. IMPRESSION: No acute cardiopulmonary abnormality seen. Electronically Signed   By: Marijo Conception, M.D.   On: 01/14/2017 07:34   Dg Chest Port 1 View  Result Date: 01/05/2017 CLINICAL DATA:  Short of breath 4 days EXAM: PORTABLE CHEST 1 VIEW COMPARISON:  Radiograph 2126 FINDINGS: Normal cardiac silhouette. No effusion, infiltrate, or pneumothorax. Patient rotated  rightward. No acute osseous abnormality. IMPRESSION: No acute cardiopulmonary process. Electronically Signed   By: Suzy Bouchard M.D.   On: 01/05/2017 09:58      Subjective: Patient states he feels well and very  excited to go home.  Asking for specific instructions when he is discharged about taking it medications and what he needs to take and how often.  Discharge Exam: Vitals:   02/02/17 2125 02/03/17 0640  BP: (!) 148/81 (!) 153/86  Pulse: 97 98  Resp: 18 20  Temp: 98.1 F (36.7 C) 98.9 F (37.2 C)   Vitals:   02/02/17 2125 02/03/17 0640 02/03/17 0746 02/03/17 0749  BP: (!) 148/81 (!) 153/86    Pulse: 97 98    Resp: 18 20    Temp: 98.1 F (36.7 C) 98.9 F (37.2 C)    TempSrc: Oral Oral    SpO2: 99% 93% 91% 91%  Weight:      Height:        General: Pt is alert, awake, not in acute distress Cardiovascular: irregularly irregular rhythm, S1/S2 +, no rubs, no gallops Respiratory: CTA bilaterally, no wheezing, no rhonchi Abdominal: Soft, NT, ND, bowel sounds + Extremities: no edema, no cyanosis    The results of significant diagnostics from this hospitalization (including imaging, microbiology, ancillary and laboratory) are listed below for reference.     Microbiology: No results found for this or any previous visit (from the past 240 hour(s)).   Labs: BNP (last 3 results)  Recent Labs  01/05/17 0846  BNP 099.8*   Basic Metabolic Panel:  Recent Labs Lab 01/31/17 1621 02/03/17 0906  NA 135 136  K 4.5 4.0  CL 103 107  CO2 26 22  GLUCOSE 192* 154*  BUN 41* 23*  CREATININE 2.50* 2.27*  CALCIUM 7.7* 7.7*   Liver Function Tests:  Recent Labs Lab 01/31/17 1621  AST 26  ALT 46  ALKPHOS 91  BILITOT 0.7  PROT 5.3*  ALBUMIN 2.5*   No results for input(s): LIPASE, AMYLASE in the last 168 hours. No results for input(s): AMMONIA in the last 168 hours. CBC:  Recent Labs Lab 01/31/17 1621  02/01/17 0351 02/01/17 0941 02/01/17 1543  02/01/17 2208 02/02/17 0404 02/02/17 1044 02/02/17 1647 02/02/17 2153 02/03/17 0431 02/03/17 0906  WBC 2.1*  --  2.3*  --   --   --   --   --   --   --   --  2.9*  HGB 10.0*  < > 9.0* 8.9* 9.0* 9.1* 10.0*  --   --   --   --  9.6*  HCT 29.0*  < > 25.7* 25.9* 26.1* 26.0* 28.4* 27.3* 28.4* 26.5* 26.1* 27.1*  MCV 98.0  --  97.0  --   --   --   --   --   --   --   --  96.1  PLT 91*  --  94*  --   --   --   --   --   --   --   --  149*  < > = values in this interval not displayed. Cardiac Enzymes: No results for input(s): CKTOTAL, CKMB, CKMBINDEX, TROPONINI in the last 168 hours. BNP: Invalid input(s): POCBNP CBG: No results for input(s): GLUCAP in the last 168 hours. D-Dimer No results for input(s): DDIMER in the last 72 hours. Hgb A1c No results for input(s): HGBA1C in the last 72 hours. Lipid Profile No results for input(s): CHOL, HDL, LDLCALC, TRIG, CHOLHDL, LDLDIRECT in the last 72 hours. Thyroid function studies No results for input(s): TSH, T4TOTAL, T3FREE, THYROIDAB in the last 72 hours.  Invalid input(s): FREET3 Anemia work up No results for input(s): VITAMINB12, FOLATE, FERRITIN, TIBC,  IRON, RETICCTPCT in the last 72 hours. Urinalysis    Component Value Date/Time   COLORURINE YELLOW 01/31/2017 1801   APPEARANCEUR HAZY (A) 01/31/2017 1801   LABSPEC 1.017 01/31/2017 1801   PHURINE 6.0 01/31/2017 1801   GLUCOSEU NEGATIVE 01/31/2017 1801   HGBUR LARGE (A) 01/31/2017 1801   BILIRUBINUR NEGATIVE 01/31/2017 1801   BILIRUBINUR neg 10/07/2014 1122   KETONESUR NEGATIVE 01/31/2017 1801   PROTEINUR 100 (A) 01/31/2017 1801   UROBILINOGEN negative 10/07/2014 1122   UROBILINOGEN 0.2 04/03/2014 2308   NITRITE NEGATIVE 01/31/2017 1801   LEUKOCYTESUR NEGATIVE 01/31/2017 1801   Sepsis Labs Invalid input(s): PROCALCITONIN,  WBC,  LACTICIDVEN Microbiology No results found for this or any previous visit (from the past 240 hour(s)).   Time coordinating discharge: 35  minutes  SIGNED:   Loretha Stapler, MD  [facility] 02/03/2017, 4:21 PM Pager [phone] If 7PM-7AM, please contact night-coverage [email or web address] Password [email or web address]

## 2017-02-03 NOTE — Progress Notes (Signed)
ANTICOAGULATION CONSULT NOTE - Initial Consult  Pharmacy Consult for COUMADIN (home med) Indication: atrial fibrillation  Allergies  Allergen Reactions  . Wellbutrin [Bupropion] Anxiety   Patient Measurements: Height: '5\' 9"'$  (175.3 cm) Weight: 164 lb 4.8 oz (74.5 kg) IBW/kg (Calculated) : 70.7  Vital Signs: Temp: 98.9 F (37.2 C) (03/24 0640) Temp Source: Oral (03/24 0640) BP: 153/86 (03/24 0640) Pulse Rate: 98 (03/24 0640)  Labs:  Recent Labs  01/31/17 1621  02/01/17 0351  02/01/17 2208 02/02/17 0404  02/02/17 1647 02/02/17 2153 02/03/17 0431  HGB 10.0*  < > 9.0*  < > 9.1* 10.0*  --   --   --   --   HCT 29.0*  < > 25.7*  < > 26.0* 28.4*  < > 28.4* 26.5* 26.1*  PLT 91*  --  94*  --   --   --   --   --   --   --   LABPROT  --   < > 55.4*  --   --  23.7*  --   --   --  17.0*  INR  --   < > >4.01*  --   --  2.08  --   --   --  1.37  CREATININE 2.50*  --   --   --   --   --   --   --   --   --   < > = values in this interval not displayed.  Estimated Creatinine Clearance: 24.7 mL/min (A) (by C-G formula based on SCr of 2.5 mg/dL (H)).  Medical History: Past Medical History:  Diagnosis Date  . Acute bronchitis 04/03/2014  . Anxiety   . Atrial fibrillation ([place])   . Cataract   . COPD (chronic obstructive pulmonary disease) ([place])   . Essential hypertension   . Hyperlipidemia   . Macular degeneration   . Noncompliance with medications 04/2013   Xarelto, digoxin previously  . Pre-diabetes   . Prostate cancer ([place])   . Stage III chronic kidney disease 04/03/2014  . Tubular adenoma of colon 07/31/02, 11/18/03   Medications:  Prescriptions Prior to Admission  Medication Sig Dispense Refill Last Dose  . albuterol (PROVENTIL HFA;VENTOLIN HFA) 108 (90 Base) MCG/ACT inhaler Inhale 2 puffs into the lungs every 6 (six) hours as needed for wheezing or shortness of breath. 1 Inhaler 0 Taking  . ALLERGY RELIEF 10 MG tablet TAKE 1 TABLET DAILY FOR ALLERGY (Patient taking  differently: TAKE 1 TABLET DAILY FOR ALLERGY,    Loratidine) 30 tablet 5 01/31/2017 at Unknown time  . amoxicillin-clavulanate (AUGMENTIN) 875-125 MG tablet Take 1 tablet by mouth 2 (two) times daily. 14 tablet 0 01/31/2017 at Unknown time  . atorvastatin (LIPITOR) 20 MG tablet TAKE 1 TABLET DAILY 30 tablet 2 01/30/2017 at Unknown time  . bisoprolol (ZEBETA) 10 MG tablet Take 1 tablet (10 mg total) by mouth 2 (two) times daily. 30 tablet 0 01/31/2017 at 0800  . budesonide-formoterol (SYMBICORT) 160-4.5 MCG/ACT inhaler Inhale 2 puffs into the lungs 2 (two) times daily. 1 Inhaler 3 Taking  . clonazePAM (KLONOPIN) 1 MG tablet TAKE 1/2 TABLET ONCE DAILY AS NEEDED FOR ANXIETY 15 tablet 1 Taking  . diltiazem (CARDIZEM CD) 180 MG 24 hr capsule Take 1 capsule (180 mg total) by mouth 2 (two) times daily. 60 capsule 0 01/31/2017 at Unknown time  . doxylamine, Sleep, (UNISOM) 25 MG tablet Take 25 mg by mouth at bedtime as needed.   Taking  .  DULoxetine (CYMBALTA) 60 MG capsule TAKE (1) CAPSULE DAILY 30 capsule 1 01/31/2017 at Unknown time  . FERREX 150 150 MG capsule Take 1 capsule by mouth daily.   01/31/2017 at Unknown time  . Incontinence Supply Disposable (INCONTINENCE BRIEF LARGE) MISC Use as needed for urinary incontinence.  Dx: urinary incontinence R32 and prostate cancer C61 200 each 1 Taking  . Multiple Vitamin (MULTIVITAMIN WITH MINERALS) TABS Take 1 tablet by mouth daily.   01/31/2017 at Unknown time  . nystatin (MYCOSTATIN) 100000 UNIT/ML suspension Take 5 mLs (500,000 Units total) by mouth 4 (four) times daily. 473 mL 2 01/31/2017 at Unknown time  . omega-3 acid ethyl esters (LOVAZA) 1 G capsule TAKE (2) CAPSULES TWICE DAILY. 120 capsule 2 01/30/2017 at Unknown time  . pantoprazole (PROTONIX) 40 MG tablet TAKE 1 TABLET DAILY 30 tablet 2 01/31/2017 at Unknown time  . Polyethyl Glycol-Propyl Glycol (SYSTANE OP) Place 1 drop into both eyes 2 (two) times daily as needed (dry eyes).    Taking  . polyethylene  glycol (MIRALAX / GLYCOLAX) packet Take 17 g by mouth 2 (two) times daily. 14 each 0 01/31/2017 at Unknown time  . predniSONE (DELTASONE) 20 MG tablet 2 tabs PO X4 days then 1.5tabs PO daily x4 days then 1tab PO daily x4 days then 0.5tabs PO daily x4 days 22 tablet 0 01/31/2017 at Unknown time  . Probiotic Product (PRO-BIOTIC BLEND) CAPS Take 1 capsule by mouth daily.   01/31/2017 at Unknown time  . terazosin (HYTRIN) 2 MG capsule TAKE 1 CAPSULE AT BEDTIME 30 capsule 4 01/31/2017 at Unknown time  . tiotropium (SPIRIVA) 18 MCG inhalation capsule Place 1 capsule (18 mcg total) into inhaler and inhale daily. 30 capsule 0 01/31/2017 at Unknown time  . triamcinolone cream (KENALOG) 0.1 % Apply 1 application topically 2 (two) times daily. 80 g 0 Taking  . warfarin (COUMADIN) 4 MG tablet Take 1 and 1/2 tablets (= '6mg'$ ) daily except on Sundays and Thursdays take 1 tablet (='4mg'$ ) 45 tablet 0 01/30/2017 at Unknown time  . nitroGLYCERIN (NITROSTAT) 0.4 MG SL tablet Place 1 tablet (0.4 mg total) under the tongue every 5 (five) minutes as needed for chest pain. (Patient not taking: Reported on 01/26/2017) 25 tablet 2 Not Taking at Unknown time   Assessment: 76yo male on chronic Coumadin PTA.  INR was elevated when admitted.  INR was reversed with Vitamin K x 2 doses.  INR now below goal.  Pt was seen by GI for GIB / anemia and GI resumed Warfarin yesterday afternoon.  INR today = 1.37.  No bleeding reported.   Goal of Therapy:  INR 2-3 Monitor platelets by anticoagulation protocol: Yes   Plan:  Coumadin '5mg'$  today x 1 INR daily Monitor for s/sx of bleeding  Hart Robinsons A 02/03/2017,8:02 AM

## 2017-02-03 NOTE — Progress Notes (Signed)
Pt discharged home today per Dr. Adair Patter.  Pt's IV site D/C'd and WDL.  Pt's VSS.  Pt and patient's son provided with home medication list, discharge instructions and prescriptions.  Verbalized understanding.  Pt left floor via WC in stable condition accompanied by NT.

## 2017-02-05 ENCOUNTER — Ambulatory Visit (INDEPENDENT_AMBULATORY_CARE_PROVIDER_SITE_OTHER): Payer: Medicare HMO | Admitting: Pharmacist

## 2017-02-05 ENCOUNTER — Other Ambulatory Visit: Payer: Self-pay | Admitting: Physician Assistant

## 2017-02-05 ENCOUNTER — Telehealth: Payer: Self-pay | Admitting: *Deleted

## 2017-02-05 DIAGNOSIS — J09X2 Influenza due to identified novel influenza A virus with other respiratory manifestations: Secondary | ICD-10-CM | POA: Diagnosis not present

## 2017-02-05 DIAGNOSIS — Z8546 Personal history of malignant neoplasm of prostate: Secondary | ICD-10-CM | POA: Diagnosis not present

## 2017-02-05 DIAGNOSIS — F17290 Nicotine dependence, other tobacco product, uncomplicated: Secondary | ICD-10-CM | POA: Diagnosis not present

## 2017-02-05 DIAGNOSIS — I480 Paroxysmal atrial fibrillation: Secondary | ICD-10-CM

## 2017-02-05 DIAGNOSIS — I129 Hypertensive chronic kidney disease with stage 1 through stage 4 chronic kidney disease, or unspecified chronic kidney disease: Secondary | ICD-10-CM | POA: Diagnosis not present

## 2017-02-05 DIAGNOSIS — J441 Chronic obstructive pulmonary disease with (acute) exacerbation: Secondary | ICD-10-CM | POA: Diagnosis not present

## 2017-02-05 DIAGNOSIS — N183 Chronic kidney disease, stage 3 (moderate): Secondary | ICD-10-CM | POA: Diagnosis not present

## 2017-02-05 DIAGNOSIS — F17210 Nicotine dependence, cigarettes, uncomplicated: Secondary | ICD-10-CM | POA: Diagnosis not present

## 2017-02-05 DIAGNOSIS — H3530 Unspecified macular degeneration: Secondary | ICD-10-CM | POA: Diagnosis not present

## 2017-02-05 DIAGNOSIS — I4891 Unspecified atrial fibrillation: Secondary | ICD-10-CM | POA: Diagnosis not present

## 2017-02-05 LAB — POCT INR: INR: 1.7

## 2017-02-05 MED ORDER — PANTOPRAZOLE SODIUM 40 MG PO TBEC: 40.0000 mg | DELAYED_RELEASE_TABLET | Freq: Two times a day (BID) | ORAL | 0 refills | Status: DC

## 2017-02-05 MED ORDER — DILTIAZEM HCL ER COATED BEADS 180 MG PO CP24: 180.0000 mg | ORAL_CAPSULE | Freq: Two times a day (BID) | ORAL | 0 refills | Status: DC

## 2017-02-05 MED ORDER — BISOPROLOL FUMARATE 10 MG PO TABS: 10.0000 mg | ORAL_TABLET | Freq: Two times a day (BID) | ORAL | 0 refills | Status: DC

## 2017-02-05 MED ORDER — WARFARIN SODIUM 4 MG PO TABS: ORAL_TABLET | ORAL | 0 refills | Status: DC

## 2017-02-05 NOTE — Telephone Encounter (Signed)
Protime 20.1 INR 1.7

## 2017-02-05 NOTE — Telephone Encounter (Signed)
I called [facility] to confirm that medication changes made during recent hospitalization has been made since they fill patient's medication and package in daily packing for him.  They were aware of holding iron supplement for the next 2 weeks but not that warfarin had been decreased to '4mg'$  daily so patient had restarted warfarin at pre hospitilization dose.  They also requested Rx for protoninx bid, diltiazem and bisoprolol .  Rx's send in Changed warfarin to '6mg'$  MWF and '4mg'$  all other days.  Notified Tina with [facility] of changes and she is to check INR in 2 days.

## 2017-02-06 DIAGNOSIS — N183 Chronic kidney disease, stage 3 (moderate): Secondary | ICD-10-CM | POA: Diagnosis not present

## 2017-02-06 DIAGNOSIS — F17210 Nicotine dependence, cigarettes, uncomplicated: Secondary | ICD-10-CM | POA: Diagnosis not present

## 2017-02-06 DIAGNOSIS — I4891 Unspecified atrial fibrillation: Secondary | ICD-10-CM | POA: Diagnosis not present

## 2017-02-06 DIAGNOSIS — J09X2 Influenza due to identified novel influenza A virus with other respiratory manifestations: Secondary | ICD-10-CM | POA: Diagnosis not present

## 2017-02-06 DIAGNOSIS — F17290 Nicotine dependence, other tobacco product, uncomplicated: Secondary | ICD-10-CM | POA: Diagnosis not present

## 2017-02-06 DIAGNOSIS — I129 Hypertensive chronic kidney disease with stage 1 through stage 4 chronic kidney disease, or unspecified chronic kidney disease: Secondary | ICD-10-CM | POA: Diagnosis not present

## 2017-02-06 DIAGNOSIS — J441 Chronic obstructive pulmonary disease with (acute) exacerbation: Secondary | ICD-10-CM | POA: Diagnosis not present

## 2017-02-06 DIAGNOSIS — H3530 Unspecified macular degeneration: Secondary | ICD-10-CM | POA: Diagnosis not present

## 2017-02-06 DIAGNOSIS — Z8546 Personal history of malignant neoplasm of prostate: Secondary | ICD-10-CM | POA: Diagnosis not present

## 2017-02-07 ENCOUNTER — Encounter (HOSPITAL_COMMUNITY): Payer: Self-pay | Admitting: Gastroenterology

## 2017-02-07 ENCOUNTER — Telehealth: Payer: Self-pay | Admitting: Family Medicine

## 2017-02-07 ENCOUNTER — Ambulatory Visit (INDEPENDENT_AMBULATORY_CARE_PROVIDER_SITE_OTHER): Payer: Medicare HMO | Admitting: Pharmacist

## 2017-02-07 ENCOUNTER — Telehealth: Payer: Self-pay | Admitting: *Deleted

## 2017-02-07 DIAGNOSIS — J441 Chronic obstructive pulmonary disease with (acute) exacerbation: Secondary | ICD-10-CM | POA: Diagnosis not present

## 2017-02-07 DIAGNOSIS — I4891 Unspecified atrial fibrillation: Secondary | ICD-10-CM | POA: Diagnosis not present

## 2017-02-07 DIAGNOSIS — Z8546 Personal history of malignant neoplasm of prostate: Secondary | ICD-10-CM | POA: Diagnosis not present

## 2017-02-07 DIAGNOSIS — H3530 Unspecified macular degeneration: Secondary | ICD-10-CM | POA: Diagnosis not present

## 2017-02-07 DIAGNOSIS — F17210 Nicotine dependence, cigarettes, uncomplicated: Secondary | ICD-10-CM | POA: Diagnosis not present

## 2017-02-07 DIAGNOSIS — I480 Paroxysmal atrial fibrillation: Secondary | ICD-10-CM

## 2017-02-07 DIAGNOSIS — N183 Chronic kidney disease, stage 3 (moderate): Secondary | ICD-10-CM | POA: Diagnosis not present

## 2017-02-07 DIAGNOSIS — I129 Hypertensive chronic kidney disease with stage 1 through stage 4 chronic kidney disease, or unspecified chronic kidney disease: Secondary | ICD-10-CM | POA: Diagnosis not present

## 2017-02-07 DIAGNOSIS — J09X2 Influenza due to identified novel influenza A virus with other respiratory manifestations: Secondary | ICD-10-CM | POA: Diagnosis not present

## 2017-02-07 DIAGNOSIS — F17290 Nicotine dependence, other tobacco product, uncomplicated: Secondary | ICD-10-CM | POA: Diagnosis not present

## 2017-02-07 LAB — POCT INR
INR: 2.7
INR: 2.8

## 2017-02-07 NOTE — Telephone Encounter (Signed)
INR 2.8

## 2017-02-07 NOTE — Telephone Encounter (Signed)
Therapeutic anticoagulation today.  INR has increased from 1.7 on 02/07/17 to 2.8 today.  Current warfarin dose if '4mg'$  qd except '6mg'$  on Mondays, Wednesdays and Fridays.  Recommend only take '4mg'$  today.  Then decrease dose to '4mg'$   Recheck INR in 5 days.

## 2017-02-07 NOTE — Telephone Encounter (Signed)
Don [name]

## 2017-02-08 DIAGNOSIS — I129 Hypertensive chronic kidney disease with stage 1 through stage 4 chronic kidney disease, or unspecified chronic kidney disease: Secondary | ICD-10-CM | POA: Diagnosis not present

## 2017-02-08 DIAGNOSIS — N183 Chronic kidney disease, stage 3 (moderate): Secondary | ICD-10-CM | POA: Diagnosis not present

## 2017-02-08 DIAGNOSIS — J441 Chronic obstructive pulmonary disease with (acute) exacerbation: Secondary | ICD-10-CM | POA: Diagnosis not present

## 2017-02-08 DIAGNOSIS — I4891 Unspecified atrial fibrillation: Secondary | ICD-10-CM | POA: Diagnosis not present

## 2017-02-08 DIAGNOSIS — F17210 Nicotine dependence, cigarettes, uncomplicated: Secondary | ICD-10-CM | POA: Diagnosis not present

## 2017-02-08 DIAGNOSIS — J09X2 Influenza due to identified novel influenza A virus with other respiratory manifestations: Secondary | ICD-10-CM | POA: Diagnosis not present

## 2017-02-08 DIAGNOSIS — F17290 Nicotine dependence, other tobacco product, uncomplicated: Secondary | ICD-10-CM | POA: Diagnosis not present

## 2017-02-08 DIAGNOSIS — H3530 Unspecified macular degeneration: Secondary | ICD-10-CM | POA: Diagnosis not present

## 2017-02-08 DIAGNOSIS — Z8546 Personal history of malignant neoplasm of prostate: Secondary | ICD-10-CM | POA: Diagnosis not present

## 2017-02-09 ENCOUNTER — Ambulatory Visit (INDEPENDENT_AMBULATORY_CARE_PROVIDER_SITE_OTHER): Payer: Medicare HMO | Admitting: Family Medicine

## 2017-02-09 DIAGNOSIS — I129 Hypertensive chronic kidney disease with stage 1 through stage 4 chronic kidney disease, or unspecified chronic kidney disease: Secondary | ICD-10-CM

## 2017-02-09 DIAGNOSIS — I4891 Unspecified atrial fibrillation: Secondary | ICD-10-CM

## 2017-02-09 DIAGNOSIS — J441 Chronic obstructive pulmonary disease with (acute) exacerbation: Secondary | ICD-10-CM | POA: Diagnosis not present

## 2017-02-12 ENCOUNTER — Other Ambulatory Visit: Payer: Medicare HMO

## 2017-02-12 DIAGNOSIS — I10 Essential (primary) hypertension: Secondary | ICD-10-CM | POA: Diagnosis not present

## 2017-02-12 DIAGNOSIS — J09X2 Influenza due to identified novel influenza A virus with other respiratory manifestations: Secondary | ICD-10-CM | POA: Diagnosis not present

## 2017-02-12 DIAGNOSIS — I4891 Unspecified atrial fibrillation: Secondary | ICD-10-CM | POA: Diagnosis not present

## 2017-02-12 DIAGNOSIS — F17290 Nicotine dependence, other tobacco product, uncomplicated: Secondary | ICD-10-CM | POA: Diagnosis not present

## 2017-02-12 DIAGNOSIS — Z8546 Personal history of malignant neoplasm of prostate: Secondary | ICD-10-CM | POA: Diagnosis not present

## 2017-02-12 DIAGNOSIS — J441 Chronic obstructive pulmonary disease with (acute) exacerbation: Secondary | ICD-10-CM | POA: Diagnosis not present

## 2017-02-12 DIAGNOSIS — I129 Hypertensive chronic kidney disease with stage 1 through stage 4 chronic kidney disease, or unspecified chronic kidney disease: Secondary | ICD-10-CM | POA: Diagnosis not present

## 2017-02-12 DIAGNOSIS — H3530 Unspecified macular degeneration: Secondary | ICD-10-CM | POA: Diagnosis not present

## 2017-02-12 DIAGNOSIS — F17210 Nicotine dependence, cigarettes, uncomplicated: Secondary | ICD-10-CM | POA: Diagnosis not present

## 2017-02-12 DIAGNOSIS — N183 Chronic kidney disease, stage 3 (moderate): Secondary | ICD-10-CM | POA: Diagnosis not present

## 2017-02-13 ENCOUNTER — Telehealth: Payer: Self-pay | Admitting: Family Medicine

## 2017-02-13 ENCOUNTER — Encounter: Payer: Self-pay | Admitting: Family Medicine

## 2017-02-13 ENCOUNTER — Ambulatory Visit (INDEPENDENT_AMBULATORY_CARE_PROVIDER_SITE_OTHER): Payer: Medicare HMO | Admitting: Family Medicine

## 2017-02-13 VITALS — BP 109/63 | HR 81 | Temp 97.8°F | Ht 69.0 in | Wt 158.0 lb

## 2017-02-13 DIAGNOSIS — Z8546 Personal history of malignant neoplasm of prostate: Secondary | ICD-10-CM | POA: Diagnosis not present

## 2017-02-13 DIAGNOSIS — J441 Chronic obstructive pulmonary disease with (acute) exacerbation: Secondary | ICD-10-CM | POA: Diagnosis not present

## 2017-02-13 DIAGNOSIS — I480 Paroxysmal atrial fibrillation: Secondary | ICD-10-CM

## 2017-02-13 DIAGNOSIS — D649 Anemia, unspecified: Secondary | ICD-10-CM

## 2017-02-13 DIAGNOSIS — R791 Abnormal coagulation profile: Secondary | ICD-10-CM | POA: Diagnosis not present

## 2017-02-13 DIAGNOSIS — N183 Chronic kidney disease, stage 3 (moderate): Secondary | ICD-10-CM | POA: Diagnosis not present

## 2017-02-13 DIAGNOSIS — J09X2 Influenza due to identified novel influenza A virus with other respiratory manifestations: Secondary | ICD-10-CM | POA: Diagnosis not present

## 2017-02-13 DIAGNOSIS — I129 Hypertensive chronic kidney disease with stage 1 through stage 4 chronic kidney disease, or unspecified chronic kidney disease: Secondary | ICD-10-CM | POA: Diagnosis not present

## 2017-02-13 DIAGNOSIS — H3530 Unspecified macular degeneration: Secondary | ICD-10-CM | POA: Diagnosis not present

## 2017-02-13 DIAGNOSIS — I4891 Unspecified atrial fibrillation: Secondary | ICD-10-CM | POA: Diagnosis not present

## 2017-02-13 DIAGNOSIS — F17210 Nicotine dependence, cigarettes, uncomplicated: Secondary | ICD-10-CM | POA: Diagnosis not present

## 2017-02-13 DIAGNOSIS — F17290 Nicotine dependence, other tobacco product, uncomplicated: Secondary | ICD-10-CM | POA: Diagnosis not present

## 2017-02-13 LAB — CBC WITH DIFFERENTIAL/PLATELET
BASOS: 1 %
Basophils Absolute: 0.1 10*3/uL (ref 0.0–0.2)
EOS (ABSOLUTE): 0 10*3/uL (ref 0.0–0.4)
Eos: 0 %
Hematocrit: 24 % — ABNORMAL LOW (ref 37.5–51.0)
Hemoglobin: 7.9 g/dL — CL (ref 13.0–17.7)
IMMATURE GRANULOCYTES: 4 %
Immature Grans (Abs): 0.4 10*3/uL — ABNORMAL HIGH (ref 0.0–0.1)
LYMPHS: 13 %
Lymphocytes Absolute: 1.2 10*3/uL (ref 0.7–3.1)
MCH: 32 pg (ref 26.6–33.0)
MCHC: 32.9 g/dL (ref 31.5–35.7)
MCV: 97 fL (ref 79–97)
MONOCYTES: 11 %
Monocytes Absolute: 1 10*3/uL — ABNORMAL HIGH (ref 0.1–0.9)
NEUTROS ABS: 6.8 10*3/uL (ref 1.4–7.0)
Neutrophils: 71 %
PLATELETS: 325 10*3/uL (ref 150–379)
RBC: 2.47 x10E6/uL — CL (ref 4.14–5.80)
RDW: 15.7 % — AB (ref 12.3–15.4)
WBC: 9.4 10*3/uL (ref 3.4–10.8)

## 2017-02-13 LAB — FINGERSTICK HEMOGLOBIN: Hemoglobin: 7.8 g/dL — ABNORMAL LOW (ref 12.6–17.7)

## 2017-02-13 LAB — BASIC METABOLIC PANEL
BUN/Creatinine Ratio: 12 (ref 10–24)
BUN: 36 mg/dL — ABNORMAL HIGH (ref 8–27)
CALCIUM: 8.1 mg/dL — AB (ref 8.6–10.2)
CHLORIDE: 105 mmol/L (ref 96–106)
CO2: 20 mmol/L (ref 18–29)
Creatinine, Ser: 2.94 mg/dL — ABNORMAL HIGH (ref 0.76–1.27)
GFR calc Af Amer: 23 mL/min/{1.73_m2} — ABNORMAL LOW (ref >59)
GFR, EST NON AFRICAN AMERICAN: 20 mL/min/{1.73_m2} — AB (ref >59)
Glucose: 118 mg/dL — ABNORMAL HIGH (ref 65–99)
POTASSIUM: 4.2 mmol/L (ref 3.5–5.2)
SODIUM: 144 mmol/L (ref 134–144)

## 2017-02-13 LAB — COAGUCHEK XS/INR WAIVED
INR: 6 — AB (ref 0.9–1.1)
Prothrombin Time: 72.5 s

## 2017-02-13 LAB — PROTIME-INR
INR: 3.7 — ABNORMAL HIGH (ref 0.8–1.2)
PROTHROMBIN TIME: 36.1 s — AB (ref 9.1–12.0)

## 2017-02-13 MED ORDER — PHYTONADIONE 5 MG PO TABS
5.0000 mg | ORAL_TABLET | Freq: Once | ORAL | Status: AC
  Administered 2017-02-13: 5 mg via ORAL

## 2017-02-13 NOTE — Progress Notes (Signed)
BP 109/63   Pulse 81   Temp 97.8 F (36.6 C) (Oral)   Ht '5\' 9"'$  (1.753 m)   Wt 158 lb (71.7 kg)   BMI 23.33 kg/m    Subjective:    Patient ID: SULAIMAN IMBERT, male    DOB: 07-26-1941, 76 y.o.   MRN: [id number]  HPI: JERMELL HOLEMAN is a 76 y.o. male presenting on 02/13/2017 for Follow-up (Recheck labs )   HPI Anemia with supratherapeutic INR Patient is coming in today for recheck on his anemia and supratherapeutic INR. He has labs drawn yesterday with home health service and came back with a hemoglobin of 7.8 which was down from 9 previously. He also came in with an INR of 3.6 which is up from what it was previously. He denies any active bruising or bleeding today. He denies any recently. He does admit that he looks a lot more pale and his son is here with him and says the same. He does feel weaker and somewhat lightheaded but denies any chest pain and shortness of breath or palpitations.  Relevant past medical, surgical, family and social history reviewed and updated as indicated. Interim medical history since our last visit reviewed. Allergies and medications reviewed and updated.  Review of Systems  Constitutional: Positive for fatigue. Negative for chills and fever.  Respiratory: Negative for shortness of breath and wheezing.   Cardiovascular: Negative for chest pain and leg swelling.  Gastrointestinal: Negative for abdominal pain, blood in stool, nausea and vomiting.  Genitourinary: Negative for hematuria.  Musculoskeletal: Negative for back pain and gait problem.  Skin: Negative for color change and rash.  Neurological: Positive for weakness (Generalized) and light-headedness. Negative for dizziness, speech difficulty and numbness.  All other systems reviewed and are negative.   Per HPI unless specifically indicated above        Objective:    BP 109/63   Pulse 81   Temp 97.8 F (36.6 C) (Oral)   Ht '5\' 9"'$  (1.753 m)   Wt 158 lb (71.7 kg)   BMI 23.33 kg/m   Wt  Readings from Last 3 Encounters:  02/13/17 158 lb (71.7 kg)  01/31/17 164 lb 4.8 oz (74.5 kg)  01/26/17 167 lb 12.8 oz (76.1 kg)    Physical Exam  Constitutional: He is oriented to person, place, and time. He appears well-developed and well-nourished. No distress.  Eyes: Conjunctivae are normal. No scleral icterus.  Neck: Neck supple.  Cardiovascular: Normal rate, regular rhythm, normal heart sounds and intact distal pulses.   No murmur heard. Pulmonary/Chest: Effort normal and breath sounds normal. No respiratory distress. He has no wheezes. He has no rales.  Musculoskeletal: Normal range of motion. He exhibits no edema.  Lymphadenopathy:    He has no cervical adenopathy.  Neurological: He is alert and oriented to person, place, and time. Coordination normal.  Skin: Skin is warm and dry. No bruising, no ecchymosis, no lesion and no rash noted. He is not diaphoretic.  Psychiatric: He has a normal mood and affect. His behavior is normal.  Nursing note and vitals reviewed.   INR: 6.0  Stat hemoglobin point-of-care: 7.8    Assessment & Plan:   Problem List Items Addressed This Visit      Cardiovascular and Mediastinum   Paroxysmal atrial fibrillation (HCC)   Relevant Medications   phytonadione (VITAMIN K) tablet 5 mg (Start on 02/13/2017  3:00 PM)    Other Visit Diagnoses    Low hemoglobin    -  Primary   Relevant Orders   CBC with Differential/Platelet   Fingerstick Hemoglobin   CBC with Differential/Platelet   Supratherapeutic INR       Relevant Medications   phytonadione (VITAMIN K) tablet 5 mg (Start on 02/13/2017  3:00 PM)      Patient's hemoglobin is stable but his INR is gone up. We gave him a dose of 5 mg of vitamin K here in the office and told him to hold his Coumadin over the next couple days. He will return in 2 days for a recheck and monitoring of any bleeding.  Follow up plan: Return in about 2 days (around 02/15/2017), or if symptoms worsen or fail to improve,  for Recheck Coumadin and hemoglobin.  Counseling provided for all of the vaccine components Orders Placed This Encounter  Procedures  . CBC with Differential/Platelet  . Fingerstick Hemoglobin  . CBC with Differential/Platelet    Caryl Pina, MD [facility] 02/13/2017, 2:52 PM

## 2017-02-13 NOTE — Telephone Encounter (Signed)
Patient came into office for appointment today with Dr Dettinger

## 2017-02-14 DIAGNOSIS — Z8546 Personal history of malignant neoplasm of prostate: Secondary | ICD-10-CM | POA: Diagnosis not present

## 2017-02-14 DIAGNOSIS — J09X2 Influenza due to identified novel influenza A virus with other respiratory manifestations: Secondary | ICD-10-CM | POA: Diagnosis not present

## 2017-02-14 DIAGNOSIS — J441 Chronic obstructive pulmonary disease with (acute) exacerbation: Secondary | ICD-10-CM | POA: Diagnosis not present

## 2017-02-14 DIAGNOSIS — F17210 Nicotine dependence, cigarettes, uncomplicated: Secondary | ICD-10-CM | POA: Diagnosis not present

## 2017-02-14 DIAGNOSIS — N183 Chronic kidney disease, stage 3 (moderate): Secondary | ICD-10-CM | POA: Diagnosis not present

## 2017-02-14 DIAGNOSIS — I129 Hypertensive chronic kidney disease with stage 1 through stage 4 chronic kidney disease, or unspecified chronic kidney disease: Secondary | ICD-10-CM | POA: Diagnosis not present

## 2017-02-14 DIAGNOSIS — F17290 Nicotine dependence, other tobacco product, uncomplicated: Secondary | ICD-10-CM | POA: Diagnosis not present

## 2017-02-14 DIAGNOSIS — I4891 Unspecified atrial fibrillation: Secondary | ICD-10-CM | POA: Diagnosis not present

## 2017-02-14 DIAGNOSIS — H3530 Unspecified macular degeneration: Secondary | ICD-10-CM | POA: Diagnosis not present

## 2017-02-14 LAB — CBC WITH DIFFERENTIAL/PLATELET
Basophils Absolute: 0.1 10*3/uL (ref 0.0–0.2)
Basos: 1 %
EOS (ABSOLUTE): 0 10*3/uL (ref 0.0–0.4)
EOS: 0 %
HEMATOCRIT: 24.1 % — AB (ref 37.5–51.0)
HEMOGLOBIN: 8 g/dL — AB (ref 13.0–17.7)
Immature Grans (Abs): 0.3 10*3/uL — ABNORMAL HIGH (ref 0.0–0.1)
Immature Granulocytes: 4 %
Lymphocytes Absolute: 1.2 10*3/uL (ref 0.7–3.1)
Lymphs: 14 %
MCH: 31.6 pg (ref 26.6–33.0)
MCHC: 33.2 g/dL (ref 31.5–35.7)
MCV: 95 fL (ref 79–97)
MONOCYTES: 9 %
MONOS ABS: 0.8 10*3/uL (ref 0.1–0.9)
NEUTROS ABS: 6.3 10*3/uL (ref 1.4–7.0)
Neutrophils: 72 %
Platelets: 363 10*3/uL (ref 150–379)
RBC: 2.53 x10E6/uL — CL (ref 4.14–5.80)
RDW: 15.5 % — AB (ref 12.3–15.4)
WBC: 8.3 10*3/uL (ref 3.4–10.8)

## 2017-02-15 ENCOUNTER — Ambulatory Visit (INDEPENDENT_AMBULATORY_CARE_PROVIDER_SITE_OTHER): Payer: Medicare HMO | Admitting: Pharmacist

## 2017-02-15 DIAGNOSIS — R791 Abnormal coagulation profile: Secondary | ICD-10-CM

## 2017-02-15 DIAGNOSIS — I480 Paroxysmal atrial fibrillation: Secondary | ICD-10-CM | POA: Diagnosis not present

## 2017-02-15 LAB — COAGUCHEK XS/INR WAIVED
INR: 2.5 — ABNORMAL HIGH (ref 0.9–1.1)
Prothrombin Time: 30.5 s

## 2017-02-15 LAB — FINGERSTICK HEMOGLOBIN: Hemoglobin: 7.9 g/dL — ABNORMAL LOW (ref 12.6–17.7)

## 2017-02-15 MED ORDER — WARFARIN SODIUM 4 MG PO TABS: ORAL_TABLET | ORAL | 0 refills | Status: DC

## 2017-02-16 DIAGNOSIS — H3530 Unspecified macular degeneration: Secondary | ICD-10-CM | POA: Diagnosis not present

## 2017-02-16 DIAGNOSIS — F17210 Nicotine dependence, cigarettes, uncomplicated: Secondary | ICD-10-CM | POA: Diagnosis not present

## 2017-02-16 DIAGNOSIS — J09X2 Influenza due to identified novel influenza A virus with other respiratory manifestations: Secondary | ICD-10-CM | POA: Diagnosis not present

## 2017-02-16 DIAGNOSIS — Z8546 Personal history of malignant neoplasm of prostate: Secondary | ICD-10-CM | POA: Diagnosis not present

## 2017-02-16 DIAGNOSIS — J441 Chronic obstructive pulmonary disease with (acute) exacerbation: Secondary | ICD-10-CM | POA: Diagnosis not present

## 2017-02-16 DIAGNOSIS — N183 Chronic kidney disease, stage 3 (moderate): Secondary | ICD-10-CM | POA: Diagnosis not present

## 2017-02-16 DIAGNOSIS — I4891 Unspecified atrial fibrillation: Secondary | ICD-10-CM | POA: Diagnosis not present

## 2017-02-16 DIAGNOSIS — F17290 Nicotine dependence, other tobacco product, uncomplicated: Secondary | ICD-10-CM | POA: Diagnosis not present

## 2017-02-16 DIAGNOSIS — I129 Hypertensive chronic kidney disease with stage 1 through stage 4 chronic kidney disease, or unspecified chronic kidney disease: Secondary | ICD-10-CM | POA: Diagnosis not present

## 2017-02-19 ENCOUNTER — Ambulatory Visit: Payer: Medicare HMO | Admitting: Family Medicine

## 2017-02-19 ENCOUNTER — Telehealth: Payer: Self-pay | Admitting: Gastroenterology

## 2017-02-19 DIAGNOSIS — H3530 Unspecified macular degeneration: Secondary | ICD-10-CM | POA: Diagnosis not present

## 2017-02-19 DIAGNOSIS — N183 Chronic kidney disease, stage 3 (moderate): Secondary | ICD-10-CM | POA: Diagnosis not present

## 2017-02-19 DIAGNOSIS — J441 Chronic obstructive pulmonary disease with (acute) exacerbation: Secondary | ICD-10-CM | POA: Diagnosis not present

## 2017-02-19 DIAGNOSIS — F17210 Nicotine dependence, cigarettes, uncomplicated: Secondary | ICD-10-CM | POA: Diagnosis not present

## 2017-02-19 DIAGNOSIS — Z8546 Personal history of malignant neoplasm of prostate: Secondary | ICD-10-CM | POA: Diagnosis not present

## 2017-02-19 DIAGNOSIS — I4891 Unspecified atrial fibrillation: Secondary | ICD-10-CM | POA: Diagnosis not present

## 2017-02-19 DIAGNOSIS — J09X2 Influenza due to identified novel influenza A virus with other respiratory manifestations: Secondary | ICD-10-CM | POA: Diagnosis not present

## 2017-02-19 DIAGNOSIS — I129 Hypertensive chronic kidney disease with stage 1 through stage 4 chronic kidney disease, or unspecified chronic kidney disease: Secondary | ICD-10-CM | POA: Diagnosis not present

## 2017-02-19 DIAGNOSIS — F17290 Nicotine dependence, other tobacco product, uncomplicated: Secondary | ICD-10-CM | POA: Diagnosis not present

## 2017-02-19 NOTE — Telephone Encounter (Signed)
Please call pt. His stomach Bx shows mild gastritis.   CONTINUE PROTONIX. TAKE 30 MINUTES PRIOR TO MEALS TWICE DAILY. FOLLOW UP IN 3 MOS E30 ANEMIA/GASTRITIS/ESOPHAGITIS.

## 2017-02-19 NOTE — Telephone Encounter (Signed)
Tried to call with no answer  

## 2017-02-20 ENCOUNTER — Encounter: Payer: Self-pay | Admitting: Family Medicine

## 2017-02-20 ENCOUNTER — Telehealth: Payer: Self-pay | Admitting: *Deleted

## 2017-02-20 DIAGNOSIS — I129 Hypertensive chronic kidney disease with stage 1 through stage 4 chronic kidney disease, or unspecified chronic kidney disease: Secondary | ICD-10-CM | POA: Diagnosis not present

## 2017-02-20 DIAGNOSIS — I4891 Unspecified atrial fibrillation: Secondary | ICD-10-CM | POA: Diagnosis not present

## 2017-02-20 DIAGNOSIS — H3530 Unspecified macular degeneration: Secondary | ICD-10-CM | POA: Diagnosis not present

## 2017-02-20 DIAGNOSIS — F17290 Nicotine dependence, other tobacco product, uncomplicated: Secondary | ICD-10-CM | POA: Diagnosis not present

## 2017-02-20 DIAGNOSIS — J441 Chronic obstructive pulmonary disease with (acute) exacerbation: Secondary | ICD-10-CM | POA: Diagnosis not present

## 2017-02-20 DIAGNOSIS — J09X2 Influenza due to identified novel influenza A virus with other respiratory manifestations: Secondary | ICD-10-CM | POA: Diagnosis not present

## 2017-02-20 DIAGNOSIS — N183 Chronic kidney disease, stage 3 (moderate): Secondary | ICD-10-CM | POA: Diagnosis not present

## 2017-02-20 DIAGNOSIS — Z8546 Personal history of malignant neoplasm of prostate: Secondary | ICD-10-CM | POA: Diagnosis not present

## 2017-02-20 DIAGNOSIS — F17210 Nicotine dependence, cigarettes, uncomplicated: Secondary | ICD-10-CM | POA: Diagnosis not present

## 2017-02-20 NOTE — Telephone Encounter (Signed)
She will be seeing him tomorrow, is she to get an INR?

## 2017-02-20 NOTE — Telephone Encounter (Signed)
OV made °

## 2017-02-20 NOTE — Telephone Encounter (Signed)
INR ordered for home health to do tomorrow 02/21/17.

## 2017-02-20 NOTE — Telephone Encounter (Signed)
Called. Many rings and no answer. Mailing a letter to call for results.

## 2017-02-21 ENCOUNTER — Ambulatory Visit: Payer: Self-pay | Admitting: Pharmacist

## 2017-02-21 ENCOUNTER — Telehealth: Payer: Self-pay | Admitting: *Deleted

## 2017-02-21 DIAGNOSIS — I129 Hypertensive chronic kidney disease with stage 1 through stage 4 chronic kidney disease, or unspecified chronic kidney disease: Secondary | ICD-10-CM | POA: Diagnosis not present

## 2017-02-21 DIAGNOSIS — F17290 Nicotine dependence, other tobacco product, uncomplicated: Secondary | ICD-10-CM | POA: Diagnosis not present

## 2017-02-21 DIAGNOSIS — H3530 Unspecified macular degeneration: Secondary | ICD-10-CM | POA: Diagnosis not present

## 2017-02-21 DIAGNOSIS — I480 Paroxysmal atrial fibrillation: Secondary | ICD-10-CM

## 2017-02-21 DIAGNOSIS — F17210 Nicotine dependence, cigarettes, uncomplicated: Secondary | ICD-10-CM | POA: Diagnosis not present

## 2017-02-21 DIAGNOSIS — I4891 Unspecified atrial fibrillation: Secondary | ICD-10-CM | POA: Diagnosis not present

## 2017-02-21 DIAGNOSIS — J441 Chronic obstructive pulmonary disease with (acute) exacerbation: Secondary | ICD-10-CM | POA: Diagnosis not present

## 2017-02-21 DIAGNOSIS — J09X2 Influenza due to identified novel influenza A virus with other respiratory manifestations: Secondary | ICD-10-CM | POA: Diagnosis not present

## 2017-02-21 DIAGNOSIS — Z8546 Personal history of malignant neoplasm of prostate: Secondary | ICD-10-CM | POA: Diagnosis not present

## 2017-02-21 DIAGNOSIS — N183 Chronic kidney disease, stage 3 (moderate): Secondary | ICD-10-CM | POA: Diagnosis not present

## 2017-02-21 LAB — POCT INR: INR: 5.3

## 2017-02-21 MED ORDER — WARFARIN SODIUM 4 MG PO TABS: ORAL_TABLET | ORAL | 0 refills | Status: DC

## 2017-02-21 NOTE — Telephone Encounter (Signed)
Don [name] with [facility] called to inform pt's INR 5.3 Please advise

## 2017-02-21 NOTE — Telephone Encounter (Signed)
Recommend no warfarin for 2 doses. Then recheck INR Friday, April 11th.  Recheck INR on Friday, April 13th.  Then decrease warfarin to '2mg'$  daily

## 2017-02-21 NOTE — Telephone Encounter (Signed)
Patient's step son was notified, [employer] was notifed of changes and Otila Kluver with [facility] was notified to check INR again Friday.

## 2017-02-22 DIAGNOSIS — F17210 Nicotine dependence, cigarettes, uncomplicated: Secondary | ICD-10-CM | POA: Diagnosis not present

## 2017-02-22 DIAGNOSIS — H3530 Unspecified macular degeneration: Secondary | ICD-10-CM | POA: Diagnosis not present

## 2017-02-22 DIAGNOSIS — F17290 Nicotine dependence, other tobacco product, uncomplicated: Secondary | ICD-10-CM | POA: Diagnosis not present

## 2017-02-22 DIAGNOSIS — N183 Chronic kidney disease, stage 3 (moderate): Secondary | ICD-10-CM | POA: Diagnosis not present

## 2017-02-22 DIAGNOSIS — I129 Hypertensive chronic kidney disease with stage 1 through stage 4 chronic kidney disease, or unspecified chronic kidney disease: Secondary | ICD-10-CM | POA: Diagnosis not present

## 2017-02-22 DIAGNOSIS — Z8546 Personal history of malignant neoplasm of prostate: Secondary | ICD-10-CM | POA: Diagnosis not present

## 2017-02-22 DIAGNOSIS — I4891 Unspecified atrial fibrillation: Secondary | ICD-10-CM | POA: Diagnosis not present

## 2017-02-22 DIAGNOSIS — J09X2 Influenza due to identified novel influenza A virus with other respiratory manifestations: Secondary | ICD-10-CM | POA: Diagnosis not present

## 2017-02-22 DIAGNOSIS — J441 Chronic obstructive pulmonary disease with (acute) exacerbation: Secondary | ICD-10-CM | POA: Diagnosis not present

## 2017-02-23 ENCOUNTER — Encounter: Payer: Self-pay | Admitting: Gastroenterology

## 2017-02-23 ENCOUNTER — Inpatient Hospital Stay: Payer: Medicare HMO | Admitting: Gastroenterology

## 2017-02-23 ENCOUNTER — Telehealth: Payer: Self-pay | Admitting: Gastroenterology

## 2017-02-23 ENCOUNTER — Telehealth: Payer: Self-pay | Admitting: *Deleted

## 2017-02-23 DIAGNOSIS — F17210 Nicotine dependence, cigarettes, uncomplicated: Secondary | ICD-10-CM | POA: Diagnosis not present

## 2017-02-23 DIAGNOSIS — J441 Chronic obstructive pulmonary disease with (acute) exacerbation: Secondary | ICD-10-CM | POA: Diagnosis not present

## 2017-02-23 DIAGNOSIS — H3530 Unspecified macular degeneration: Secondary | ICD-10-CM | POA: Diagnosis not present

## 2017-02-23 DIAGNOSIS — F17290 Nicotine dependence, other tobacco product, uncomplicated: Secondary | ICD-10-CM | POA: Diagnosis not present

## 2017-02-23 DIAGNOSIS — J09X2 Influenza due to identified novel influenza A virus with other respiratory manifestations: Secondary | ICD-10-CM | POA: Diagnosis not present

## 2017-02-23 DIAGNOSIS — Z8546 Personal history of malignant neoplasm of prostate: Secondary | ICD-10-CM | POA: Diagnosis not present

## 2017-02-23 DIAGNOSIS — N183 Chronic kidney disease, stage 3 (moderate): Secondary | ICD-10-CM | POA: Diagnosis not present

## 2017-02-23 DIAGNOSIS — I129 Hypertensive chronic kidney disease with stage 1 through stage 4 chronic kidney disease, or unspecified chronic kidney disease: Secondary | ICD-10-CM | POA: Diagnosis not present

## 2017-02-23 DIAGNOSIS — I4891 Unspecified atrial fibrillation: Secondary | ICD-10-CM | POA: Diagnosis not present

## 2017-02-23 LAB — POCT INR: INR: 2.6

## 2017-02-23 NOTE — Telephone Encounter (Signed)
INR 2.6  He has held coumadin for 2 days. Call [place]

## 2017-02-23 NOTE — Telephone Encounter (Signed)
I actually called Don [name] with [facility] and asked that she check INR next Tuesday or Wednesday because patient receives his new medication packaging on Thursdays with a Friday start and it would be easier to make change prior to THursday.

## 2017-02-23 NOTE — Telephone Encounter (Signed)
PATIENT WAS A NO SHOW AND LETTER SENT  °

## 2017-02-23 NOTE — Telephone Encounter (Signed)
I spoke with Don [name] today about Don [name] INR.  It was 2.6 today and he had held his dose for two days and then it was decreased to 1/2 tablet of '4mg'$  warfarin a day or '2mg'$ .  He receives his medications in bubble packaging.  Don [name] will re-check patient's INR 1 week from today.

## 2017-02-26 DIAGNOSIS — F17210 Nicotine dependence, cigarettes, uncomplicated: Secondary | ICD-10-CM | POA: Diagnosis not present

## 2017-02-26 DIAGNOSIS — F17290 Nicotine dependence, other tobacco product, uncomplicated: Secondary | ICD-10-CM | POA: Diagnosis not present

## 2017-02-26 DIAGNOSIS — J441 Chronic obstructive pulmonary disease with (acute) exacerbation: Secondary | ICD-10-CM | POA: Diagnosis not present

## 2017-02-26 DIAGNOSIS — N183 Chronic kidney disease, stage 3 (moderate): Secondary | ICD-10-CM | POA: Diagnosis not present

## 2017-02-26 DIAGNOSIS — I4891 Unspecified atrial fibrillation: Secondary | ICD-10-CM | POA: Diagnosis not present

## 2017-02-26 DIAGNOSIS — I129 Hypertensive chronic kidney disease with stage 1 through stage 4 chronic kidney disease, or unspecified chronic kidney disease: Secondary | ICD-10-CM | POA: Diagnosis not present

## 2017-02-26 DIAGNOSIS — H3530 Unspecified macular degeneration: Secondary | ICD-10-CM | POA: Diagnosis not present

## 2017-02-26 DIAGNOSIS — J09X2 Influenza due to identified novel influenza A virus with other respiratory manifestations: Secondary | ICD-10-CM | POA: Diagnosis not present

## 2017-02-26 DIAGNOSIS — Z8546 Personal history of malignant neoplasm of prostate: Secondary | ICD-10-CM | POA: Diagnosis not present

## 2017-02-27 ENCOUNTER — Ambulatory Visit (INDEPENDENT_AMBULATORY_CARE_PROVIDER_SITE_OTHER): Payer: Medicare HMO | Admitting: Pharmacist

## 2017-02-27 ENCOUNTER — Telehealth: Payer: Self-pay | Admitting: *Deleted

## 2017-02-27 ENCOUNTER — Ambulatory Visit: Payer: Self-pay | Admitting: Family Medicine

## 2017-02-27 DIAGNOSIS — I480 Paroxysmal atrial fibrillation: Secondary | ICD-10-CM

## 2017-02-27 DIAGNOSIS — H3530 Unspecified macular degeneration: Secondary | ICD-10-CM | POA: Diagnosis not present

## 2017-02-27 DIAGNOSIS — Z8546 Personal history of malignant neoplasm of prostate: Secondary | ICD-10-CM | POA: Diagnosis not present

## 2017-02-27 DIAGNOSIS — F17210 Nicotine dependence, cigarettes, uncomplicated: Secondary | ICD-10-CM | POA: Diagnosis not present

## 2017-02-27 DIAGNOSIS — J441 Chronic obstructive pulmonary disease with (acute) exacerbation: Secondary | ICD-10-CM | POA: Diagnosis not present

## 2017-02-27 DIAGNOSIS — I4891 Unspecified atrial fibrillation: Secondary | ICD-10-CM | POA: Diagnosis not present

## 2017-02-27 DIAGNOSIS — J09X2 Influenza due to identified novel influenza A virus with other respiratory manifestations: Secondary | ICD-10-CM | POA: Diagnosis not present

## 2017-02-27 DIAGNOSIS — F17290 Nicotine dependence, other tobacco product, uncomplicated: Secondary | ICD-10-CM | POA: Diagnosis not present

## 2017-02-27 DIAGNOSIS — I129 Hypertensive chronic kidney disease with stage 1 through stage 4 chronic kidney disease, or unspecified chronic kidney disease: Secondary | ICD-10-CM | POA: Diagnosis not present

## 2017-02-27 DIAGNOSIS — N183 Chronic kidney disease, stage 3 (moderate): Secondary | ICD-10-CM | POA: Diagnosis not present

## 2017-02-27 LAB — POCT INR: INR: 1.5

## 2017-02-27 NOTE — Telephone Encounter (Signed)
Per Otila Kluver with [facility] INR 1.5 Please advise

## 2017-02-27 NOTE — Telephone Encounter (Signed)
Patient held wafarin for 2 doses last week due to elevated INR which is likely why INR is low today.  Recommend wontinue warfarin '4mg'$  - take 1/2 tablet or '2mg'$  daily.  Recheck INR on Friday, April 20th.   Shelly notified and Otila Kluver with [facility].

## 2017-02-28 ENCOUNTER — Ambulatory Visit: Payer: Self-pay | Admitting: Family Medicine

## 2017-02-28 DIAGNOSIS — J09X2 Influenza due to identified novel influenza A virus with other respiratory manifestations: Secondary | ICD-10-CM | POA: Diagnosis not present

## 2017-02-28 DIAGNOSIS — H3530 Unspecified macular degeneration: Secondary | ICD-10-CM | POA: Diagnosis not present

## 2017-02-28 DIAGNOSIS — J441 Chronic obstructive pulmonary disease with (acute) exacerbation: Secondary | ICD-10-CM | POA: Diagnosis not present

## 2017-02-28 DIAGNOSIS — I129 Hypertensive chronic kidney disease with stage 1 through stage 4 chronic kidney disease, or unspecified chronic kidney disease: Secondary | ICD-10-CM | POA: Diagnosis not present

## 2017-02-28 DIAGNOSIS — F17210 Nicotine dependence, cigarettes, uncomplicated: Secondary | ICD-10-CM | POA: Diagnosis not present

## 2017-02-28 DIAGNOSIS — N183 Chronic kidney disease, stage 3 (moderate): Secondary | ICD-10-CM | POA: Diagnosis not present

## 2017-02-28 DIAGNOSIS — I4891 Unspecified atrial fibrillation: Secondary | ICD-10-CM | POA: Diagnosis not present

## 2017-02-28 DIAGNOSIS — Z8546 Personal history of malignant neoplasm of prostate: Secondary | ICD-10-CM | POA: Diagnosis not present

## 2017-02-28 DIAGNOSIS — F17290 Nicotine dependence, other tobacco product, uncomplicated: Secondary | ICD-10-CM | POA: Diagnosis not present

## 2017-03-01 ENCOUNTER — Ambulatory Visit (INDEPENDENT_AMBULATORY_CARE_PROVIDER_SITE_OTHER): Payer: Medicare HMO

## 2017-03-01 ENCOUNTER — Ambulatory Visit (INDEPENDENT_AMBULATORY_CARE_PROVIDER_SITE_OTHER): Payer: Medicare HMO | Admitting: Family Medicine

## 2017-03-01 ENCOUNTER — Encounter: Payer: Self-pay | Admitting: Family Medicine

## 2017-03-01 VITALS — BP 91/58 | HR 87 | Temp 96.9°F | Ht 69.0 in | Wt 151.0 lb

## 2017-03-01 DIAGNOSIS — I10 Essential (primary) hypertension: Secondary | ICD-10-CM

## 2017-03-01 DIAGNOSIS — D500 Iron deficiency anemia secondary to blood loss (chronic): Secondary | ICD-10-CM | POA: Diagnosis not present

## 2017-03-01 DIAGNOSIS — J439 Emphysema, unspecified: Secondary | ICD-10-CM | POA: Diagnosis not present

## 2017-03-01 DIAGNOSIS — E785 Hyperlipidemia, unspecified: Secondary | ICD-10-CM | POA: Diagnosis not present

## 2017-03-01 DIAGNOSIS — R7303 Prediabetes: Secondary | ICD-10-CM

## 2017-03-01 DIAGNOSIS — J189 Pneumonia, unspecified organism: Secondary | ICD-10-CM

## 2017-03-01 LAB — BAYER DCA HB A1C WAIVED: HB A1C (BAYER DCA - WAIVED): 6.1 % (ref <7.0)

## 2017-03-01 MED ORDER — TIOTROPIUM BROMIDE MONOHYDRATE 18 MCG IN CAPS: 18.0000 ug | ORAL_CAPSULE | Freq: Every day | RESPIRATORY_TRACT | 2 refills | Status: DC

## 2017-03-01 MED ORDER — METHYLPREDNISOLONE ACETATE 80 MG/ML IJ SUSP
80.0000 mg | Freq: Once | INTRAMUSCULAR | Status: AC
  Administered 2017-03-01: 80 mg via INTRAMUSCULAR

## 2017-03-01 NOTE — Assessment & Plan Note (Signed)
Mild exacerbation, not using Spiriva as they should. We'll give steroid shot to help him and then he needs to use the Spiriva daily for prevention.

## 2017-03-01 NOTE — Progress Notes (Signed)
BP (!) 91/58   Pulse 87   Temp (!) 96.9 F (36.1 C) (Oral)   Ht '5\' 9"'$  (1.753 m)   Wt 151 lb (68.5 kg)   SpO2 99%   BMI 22.30 kg/m    Subjective:    Patient ID: Don [name], male    DOB: [birth year], 76 y.o.   MRN: [id number]  HPI: Don [name] is a 76 y.o. male presenting on 03/01/2017 for Hypertension (6 month followup); COPD; Hyperlipidemia; and Cough (concerned because he has a cough, sometimes is able to cough up phlegm, seems to be worse in the morning, noticed some wheezing)   HPI Hypertension recheck Patient is coming in for a hypertension recheck today and for routine follow-up. His blood pressure is 91/58. He has been having some difficulties with anemia and his artery had some of his blood pressure medications back off except for the essentials. He denies any lightheadedness or dizziness or syncope. Patient denies headaches, blurred vision, chest pains, shortness of breath, or weakness. Denies any side effects from medication and is content with current medication.   Hyperlipidemia Patient is coming in for recheck of his hyperlipidemia. He is currently taking Lipitor. He denies any issues with myalgias or history of liver damage from it. He denies any focal numbness or weakness or chest pain.   Prediabetes Patient comes in today for recheck of his diabetes. Patient has been currently taking diet controlled and we are monitoring for now. Patient is not currently on an ACE inhibitor because of his low blood pressures. Patient has not seen an ophthalmologist this year. Patient denies any issues with his feet.   Anemia recheck Patient has been having issues with suspected blood loss off and on over the past few months that has been causing anemia. There have been attempts to find the source of the blood loss but his gastroenterologist has been unable to find to this point yet. His Coumadin has been played a factor in this as he will get too thin at times and even one time had  to be reversed with vitamin K. His last CBC was increasing from the previous and he is due for recheck today. He is still feeling very weak and pale and has a lot of decreased energy. He has been taking daily iron supplementation and needs to continue taking it. He denies any chest pain or shortness of breath.  COPD recheck Patient is coming in for recheck of his COPD and breathing. He says that has been very stable and he is continuing to use his medications as prescribed. He denies any new issues with wheezing or coughing more than his baseline.  Relevant past medical, surgical, family and social history reviewed and updated as indicated. Interim medical history since our last visit reviewed. Allergies and medications reviewed and updated.  Review of Systems  Constitutional: Positive for fatigue. Negative for chills and fever.  Respiratory: Positive for cough. Negative for shortness of breath and wheezing.   Cardiovascular: Negative for chest pain and leg swelling.  Gastrointestinal: Negative for abdominal pain.  Musculoskeletal: Negative for back pain and gait problem.  Skin: Negative for rash.  Neurological: Positive for weakness. Negative for dizziness, light-headedness and numbness.  All other systems reviewed and are negative.   Per HPI unless specifically indicated above      Objective:    BP (!) 91/58   Pulse 87   Temp (!) 96.9 F (36.1 C) (Oral)   Ht '5\' 9"'$  (1.753 m)  Wt 151 lb (68.5 kg)   SpO2 99%   BMI 22.30 kg/m   Wt Readings from Last 3 Encounters:  03/01/17 151 lb (68.5 kg)  02/13/17 158 lb (71.7 kg)  01/31/17 164 lb 4.8 oz (74.5 kg)    Physical Exam  Constitutional: He is oriented to person, place, and time. He appears well-developed and well-nourished. No distress.  Eyes: Conjunctivae are normal. No scleral icterus.  Neck: Neck supple. No thyromegaly present.  Cardiovascular: Normal rate, regular rhythm, normal heart sounds and intact distal pulses.   No  murmur heard. Pulmonary/Chest: Effort normal and breath sounds normal. No respiratory distress. He has no wheezes. He has no rales.  Musculoskeletal: Normal range of motion. He exhibits no edema.  Lymphadenopathy:    He has no cervical adenopathy.  Neurological: He is alert and oriented to person, place, and time. Coordination normal.  Skin: Skin is warm and dry. No rash noted. He is not diaphoretic.  Psychiatric: He has a normal mood and affect. His behavior is normal.  Nursing note and vitals reviewed.     Assessment & Plan:   Problem List Items Addressed This Visit      Cardiovascular and Mediastinum   Hypertension - Primary   Relevant Orders   CMP14+EGFR     Respiratory   COPD (chronic obstructive pulmonary disease) (HCC)    Mild exacerbation, not using Spiriva as they should. We'll give steroid shot to help him and then he needs to use the Spiriva daily for prevention.      Relevant Medications   methylPREDNISolone acetate (DEPO-MEDROL) injection 80 mg   tiotropium (SPIRIVA) 18 MCG inhalation capsule   Other Relevant Orders   DG Chest 2 View     Other   Hyperlipidemia LDL goal <130   Relevant Orders   Lipid panel   Pre-diabetes   Relevant Orders   Bayer DCA Hb A1c Waived   Anemia   Relevant Orders   CBC with Differential/Platelet       Follow up plan: Return if symptoms worsen or fail to improve.  Counseling provided for all of the vaccine components Orders Placed This Encounter  Procedures  . DG Chest 2 View  . CBC with Differential/Platelet  . CMP14+EGFR  . Lipid panel  . Bayer [facility] Hb A1c Rhine, MD [facility] 03/01/2017, 11:00 AM

## 2017-03-02 ENCOUNTER — Ambulatory Visit (INDEPENDENT_AMBULATORY_CARE_PROVIDER_SITE_OTHER): Payer: Medicare HMO | Admitting: *Deleted

## 2017-03-02 ENCOUNTER — Telehealth: Payer: Self-pay | Admitting: *Deleted

## 2017-03-02 ENCOUNTER — Other Ambulatory Visit: Payer: Self-pay | Admitting: *Deleted

## 2017-03-02 ENCOUNTER — Ambulatory Visit: Payer: Self-pay | Admitting: Pharmacist

## 2017-03-02 DIAGNOSIS — J441 Chronic obstructive pulmonary disease with (acute) exacerbation: Secondary | ICD-10-CM | POA: Diagnosis not present

## 2017-03-02 DIAGNOSIS — N183 Chronic kidney disease, stage 3 (moderate): Secondary | ICD-10-CM | POA: Diagnosis not present

## 2017-03-02 DIAGNOSIS — F17210 Nicotine dependence, cigarettes, uncomplicated: Secondary | ICD-10-CM | POA: Diagnosis not present

## 2017-03-02 DIAGNOSIS — J09X2 Influenza due to identified novel influenza A virus with other respiratory manifestations: Secondary | ICD-10-CM | POA: Diagnosis not present

## 2017-03-02 DIAGNOSIS — Z111 Encounter for screening for respiratory tuberculosis: Secondary | ICD-10-CM

## 2017-03-02 DIAGNOSIS — I4891 Unspecified atrial fibrillation: Secondary | ICD-10-CM | POA: Diagnosis not present

## 2017-03-02 DIAGNOSIS — Z23 Encounter for immunization: Secondary | ICD-10-CM

## 2017-03-02 DIAGNOSIS — I480 Paroxysmal atrial fibrillation: Secondary | ICD-10-CM

## 2017-03-02 DIAGNOSIS — F17290 Nicotine dependence, other tobacco product, uncomplicated: Secondary | ICD-10-CM | POA: Diagnosis not present

## 2017-03-02 DIAGNOSIS — Z8546 Personal history of malignant neoplasm of prostate: Secondary | ICD-10-CM | POA: Diagnosis not present

## 2017-03-02 DIAGNOSIS — I129 Hypertensive chronic kidney disease with stage 1 through stage 4 chronic kidney disease, or unspecified chronic kidney disease: Secondary | ICD-10-CM | POA: Diagnosis not present

## 2017-03-02 DIAGNOSIS — H3530 Unspecified macular degeneration: Secondary | ICD-10-CM | POA: Diagnosis not present

## 2017-03-02 LAB — CMP14+EGFR
ALT: 22 IU/L (ref 0–44)
AST: 13 IU/L (ref 0–40)
Albumin/Globulin Ratio: 1.3 (ref 1.2–2.2)
Albumin: 2.9 g/dL — ABNORMAL LOW (ref 3.5–4.8)
Alkaline Phosphatase: 126 IU/L — ABNORMAL HIGH (ref 39–117)
BUN/Creatinine Ratio: 11 (ref 10–24)
BUN: 28 mg/dL — AB (ref 8–27)
Bilirubin Total: 0.4 mg/dL (ref 0.0–1.2)
CALCIUM: 8.3 mg/dL — AB (ref 8.6–10.2)
CHLORIDE: 102 mmol/L (ref 96–106)
CO2: 22 mmol/L (ref 18–29)
CREATININE: 2.65 mg/dL — AB (ref 0.76–1.27)
GFR, EST AFRICAN AMERICAN: 26 mL/min/{1.73_m2} — AB (ref >59)
GFR, EST NON AFRICAN AMERICAN: 22 mL/min/{1.73_m2} — AB (ref >59)
GLUCOSE: 142 mg/dL — AB (ref 65–99)
Globulin, Total: 2.3 g/dL (ref 1.5–4.5)
POTASSIUM: 4.7 mmol/L (ref 3.5–5.2)
SODIUM: 141 mmol/L (ref 134–144)
TOTAL PROTEIN: 5.2 g/dL — AB (ref 6.0–8.5)

## 2017-03-02 LAB — CBC WITH DIFFERENTIAL/PLATELET
BASOS: 1 %
Basophils Absolute: 0.1 10*3/uL (ref 0.0–0.2)
EOS (ABSOLUTE): 0.3 10*3/uL (ref 0.0–0.4)
Eos: 3 %
Hematocrit: 26.3 % — ABNORMAL LOW (ref 37.5–51.0)
Hemoglobin: 8.6 g/dL — CL (ref 13.0–17.7)
IMMATURE GRANS (ABS): 0.1 10*3/uL (ref 0.0–0.1)
IMMATURE GRANULOCYTES: 1 %
LYMPHS: 31 %
Lymphocytes Absolute: 3.1 10*3/uL (ref 0.7–3.1)
MCH: 31.6 pg (ref 26.6–33.0)
MCHC: 32.7 g/dL (ref 31.5–35.7)
MCV: 97 fL (ref 79–97)
Monocytes Absolute: 0.9 10*3/uL (ref 0.1–0.9)
Monocytes: 8 %
NEUTROS PCT: 56 %
Neutrophils Absolute: 5.8 10*3/uL (ref 1.4–7.0)
Platelets: 347 10*3/uL (ref 150–379)
RBC: 2.72 x10E6/uL — CL (ref 4.14–5.80)
RDW: 15.9 % — ABNORMAL HIGH (ref 12.3–15.4)
WBC: 10.2 10*3/uL (ref 3.4–10.8)

## 2017-03-02 LAB — LIPID PANEL
Chol/HDL Ratio: 4 ratio (ref 0.0–5.0)
Cholesterol, Total: 107 mg/dL (ref 100–199)
HDL: 27 mg/dL — ABNORMAL LOW (ref >39)
LDL Calculated: 55 mg/dL (ref 0–99)
Triglycerides: 126 mg/dL (ref 0–149)
VLDL CHOLESTEROL CAL: 25 mg/dL (ref 5–40)

## 2017-03-02 LAB — POCT INR: INR: 1.9

## 2017-03-02 MED ORDER — DOXYCYCLINE MONOHYDRATE 100 MG PO TABS: 100.0000 mg | ORAL_TABLET | Freq: Two times a day (BID) | ORAL | 0 refills | Status: DC

## 2017-03-02 NOTE — Progress Notes (Signed)
RX sent into [facility] in error Changed to [facility] per pt request

## 2017-03-02 NOTE — Telephone Encounter (Signed)
INR 1.9  °

## 2017-03-02 NOTE — Progress Notes (Signed)
Pt given PPD placed L forearm Pt tolerated well

## 2017-03-02 NOTE — Telephone Encounter (Signed)
INR improved from 1.5 to 1.9.  Patient started doxycycline today.  Continue warfarin '4mg'$  1/2 tablet qd.  Recheck INR Monday 03/05/17

## 2017-03-05 ENCOUNTER — Telehealth: Payer: Self-pay | Admitting: *Deleted

## 2017-03-05 ENCOUNTER — Ambulatory Visit: Payer: Self-pay | Admitting: Pharmacist

## 2017-03-05 DIAGNOSIS — F17210 Nicotine dependence, cigarettes, uncomplicated: Secondary | ICD-10-CM | POA: Diagnosis not present

## 2017-03-05 DIAGNOSIS — H3530 Unspecified macular degeneration: Secondary | ICD-10-CM | POA: Diagnosis not present

## 2017-03-05 DIAGNOSIS — J09X2 Influenza due to identified novel influenza A virus with other respiratory manifestations: Secondary | ICD-10-CM | POA: Diagnosis not present

## 2017-03-05 DIAGNOSIS — Z8546 Personal history of malignant neoplasm of prostate: Secondary | ICD-10-CM | POA: Diagnosis not present

## 2017-03-05 DIAGNOSIS — N183 Chronic kidney disease, stage 3 (moderate): Secondary | ICD-10-CM | POA: Diagnosis not present

## 2017-03-05 DIAGNOSIS — I129 Hypertensive chronic kidney disease with stage 1 through stage 4 chronic kidney disease, or unspecified chronic kidney disease: Secondary | ICD-10-CM | POA: Diagnosis not present

## 2017-03-05 DIAGNOSIS — J441 Chronic obstructive pulmonary disease with (acute) exacerbation: Secondary | ICD-10-CM | POA: Diagnosis not present

## 2017-03-05 DIAGNOSIS — F17290 Nicotine dependence, other tobacco product, uncomplicated: Secondary | ICD-10-CM | POA: Diagnosis not present

## 2017-03-05 DIAGNOSIS — I480 Paroxysmal atrial fibrillation: Secondary | ICD-10-CM

## 2017-03-05 DIAGNOSIS — I4891 Unspecified atrial fibrillation: Secondary | ICD-10-CM | POA: Diagnosis not present

## 2017-03-05 LAB — POCT INR
INR: 5.5
INR: 5.8

## 2017-03-05 MED ORDER — WARFARIN SODIUM 1 MG PO TABS: 1.0000 mg | ORAL_TABLET | Freq: Every day | ORAL | 1 refills | Status: DC

## 2017-03-05 NOTE — Telephone Encounter (Signed)
INR 5.8. She left this on voice mail. States he is taking 4 mg on T, Th, Sat and Sun. Taking 1.5 mg on M, W, F. This doesn't match the chart. Tell her if it is out of range she should never leave on a voice mail.

## 2017-03-05 NOTE — Telephone Encounter (Signed)
Verified with [facility] that they are filling pill boxes correctly - Warfarin '4mg'$  tablets - take 1/2 tablet daily.   I tried to call [place] nurse to verify the dose that she reported but got VM with no identifying information so just left message for her to call our office.

## 2017-03-05 NOTE — Telephone Encounter (Signed)
Spoke with [name] from [place].  She states patient was taking '2mg'$  qd not instructions that she call with earlier (these were old instructions in the [facility] computer).  She is calling caregiver with instructions and will recheck INR 03/08/17

## 2017-03-06 ENCOUNTER — Other Ambulatory Visit: Payer: Self-pay | Admitting: Pharmacist

## 2017-03-06 ENCOUNTER — Other Ambulatory Visit: Payer: Self-pay | Admitting: Family Medicine

## 2017-03-06 ENCOUNTER — Ambulatory Visit: Payer: Medicare HMO | Admitting: Pharmacist

## 2017-03-07 DIAGNOSIS — Z8546 Personal history of malignant neoplasm of prostate: Secondary | ICD-10-CM | POA: Diagnosis not present

## 2017-03-07 DIAGNOSIS — I4891 Unspecified atrial fibrillation: Secondary | ICD-10-CM | POA: Diagnosis not present

## 2017-03-07 DIAGNOSIS — J441 Chronic obstructive pulmonary disease with (acute) exacerbation: Secondary | ICD-10-CM | POA: Diagnosis not present

## 2017-03-07 DIAGNOSIS — F17210 Nicotine dependence, cigarettes, uncomplicated: Secondary | ICD-10-CM | POA: Diagnosis not present

## 2017-03-07 DIAGNOSIS — N183 Chronic kidney disease, stage 3 (moderate): Secondary | ICD-10-CM | POA: Diagnosis not present

## 2017-03-07 DIAGNOSIS — I129 Hypertensive chronic kidney disease with stage 1 through stage 4 chronic kidney disease, or unspecified chronic kidney disease: Secondary | ICD-10-CM | POA: Diagnosis not present

## 2017-03-07 DIAGNOSIS — J09X2 Influenza due to identified novel influenza A virus with other respiratory manifestations: Secondary | ICD-10-CM | POA: Diagnosis not present

## 2017-03-07 DIAGNOSIS — F17290 Nicotine dependence, other tobacco product, uncomplicated: Secondary | ICD-10-CM | POA: Diagnosis not present

## 2017-03-07 DIAGNOSIS — H3530 Unspecified macular degeneration: Secondary | ICD-10-CM | POA: Diagnosis not present

## 2017-03-08 ENCOUNTER — Telehealth: Payer: Self-pay

## 2017-03-08 ENCOUNTER — Ambulatory Visit (INDEPENDENT_AMBULATORY_CARE_PROVIDER_SITE_OTHER): Payer: Medicare HMO | Admitting: Pharmacist

## 2017-03-08 DIAGNOSIS — F17210 Nicotine dependence, cigarettes, uncomplicated: Secondary | ICD-10-CM | POA: Diagnosis not present

## 2017-03-08 DIAGNOSIS — N183 Chronic kidney disease, stage 3 (moderate): Secondary | ICD-10-CM | POA: Diagnosis not present

## 2017-03-08 DIAGNOSIS — F17290 Nicotine dependence, other tobacco product, uncomplicated: Secondary | ICD-10-CM | POA: Diagnosis not present

## 2017-03-08 DIAGNOSIS — I480 Paroxysmal atrial fibrillation: Secondary | ICD-10-CM

## 2017-03-08 DIAGNOSIS — Z8546 Personal history of malignant neoplasm of prostate: Secondary | ICD-10-CM | POA: Diagnosis not present

## 2017-03-08 DIAGNOSIS — I129 Hypertensive chronic kidney disease with stage 1 through stage 4 chronic kidney disease, or unspecified chronic kidney disease: Secondary | ICD-10-CM | POA: Diagnosis not present

## 2017-03-08 DIAGNOSIS — J09X2 Influenza due to identified novel influenza A virus with other respiratory manifestations: Secondary | ICD-10-CM | POA: Diagnosis not present

## 2017-03-08 DIAGNOSIS — H3530 Unspecified macular degeneration: Secondary | ICD-10-CM | POA: Diagnosis not present

## 2017-03-08 DIAGNOSIS — J441 Chronic obstructive pulmonary disease with (acute) exacerbation: Secondary | ICD-10-CM | POA: Diagnosis not present

## 2017-03-08 DIAGNOSIS — I4891 Unspecified atrial fibrillation: Secondary | ICD-10-CM | POA: Diagnosis not present

## 2017-03-08 LAB — POCT INR: INR: 1.2

## 2017-03-08 NOTE — Telephone Encounter (Signed)
See routing notes - INR was 1.2 per [facility] nurse INR was 5.8 03/05/2017 and warfarin was held for last 3 days.  Plan is to restart warfarin at low dose '1mg'$  daily.  [facility] will recheck INR Monday, April 30th.  [name] notified of results and order to recheck INR

## 2017-03-09 ENCOUNTER — Telehealth: Payer: Self-pay | Admitting: Family Medicine

## 2017-03-09 DIAGNOSIS — F17210 Nicotine dependence, cigarettes, uncomplicated: Secondary | ICD-10-CM | POA: Diagnosis not present

## 2017-03-09 DIAGNOSIS — I4891 Unspecified atrial fibrillation: Secondary | ICD-10-CM | POA: Diagnosis not present

## 2017-03-09 DIAGNOSIS — N183 Chronic kidney disease, stage 3 (moderate): Secondary | ICD-10-CM | POA: Diagnosis not present

## 2017-03-09 DIAGNOSIS — Z8546 Personal history of malignant neoplasm of prostate: Secondary | ICD-10-CM | POA: Diagnosis not present

## 2017-03-09 DIAGNOSIS — H3530 Unspecified macular degeneration: Secondary | ICD-10-CM | POA: Diagnosis not present

## 2017-03-09 DIAGNOSIS — J09X2 Influenza due to identified novel influenza A virus with other respiratory manifestations: Secondary | ICD-10-CM | POA: Diagnosis not present

## 2017-03-09 DIAGNOSIS — I129 Hypertensive chronic kidney disease with stage 1 through stage 4 chronic kidney disease, or unspecified chronic kidney disease: Secondary | ICD-10-CM | POA: Diagnosis not present

## 2017-03-09 DIAGNOSIS — F17290 Nicotine dependence, other tobacco product, uncomplicated: Secondary | ICD-10-CM | POA: Diagnosis not present

## 2017-03-09 DIAGNOSIS — J441 Chronic obstructive pulmonary disease with (acute) exacerbation: Secondary | ICD-10-CM | POA: Diagnosis not present

## 2017-03-12 ENCOUNTER — Ambulatory Visit (INDEPENDENT_AMBULATORY_CARE_PROVIDER_SITE_OTHER): Payer: Medicare HMO | Admitting: Pharmacist

## 2017-03-12 ENCOUNTER — Telehealth: Payer: Self-pay | Admitting: *Deleted

## 2017-03-12 DIAGNOSIS — F17290 Nicotine dependence, other tobacco product, uncomplicated: Secondary | ICD-10-CM | POA: Diagnosis not present

## 2017-03-12 DIAGNOSIS — I480 Paroxysmal atrial fibrillation: Secondary | ICD-10-CM

## 2017-03-12 DIAGNOSIS — I4891 Unspecified atrial fibrillation: Secondary | ICD-10-CM | POA: Diagnosis not present

## 2017-03-12 DIAGNOSIS — N183 Chronic kidney disease, stage 3 (moderate): Secondary | ICD-10-CM | POA: Diagnosis not present

## 2017-03-12 DIAGNOSIS — H3530 Unspecified macular degeneration: Secondary | ICD-10-CM | POA: Diagnosis not present

## 2017-03-12 DIAGNOSIS — I129 Hypertensive chronic kidney disease with stage 1 through stage 4 chronic kidney disease, or unspecified chronic kidney disease: Secondary | ICD-10-CM | POA: Diagnosis not present

## 2017-03-12 DIAGNOSIS — F17210 Nicotine dependence, cigarettes, uncomplicated: Secondary | ICD-10-CM | POA: Diagnosis not present

## 2017-03-12 DIAGNOSIS — J09X2 Influenza due to identified novel influenza A virus with other respiratory manifestations: Secondary | ICD-10-CM | POA: Diagnosis not present

## 2017-03-12 DIAGNOSIS — J441 Chronic obstructive pulmonary disease with (acute) exacerbation: Secondary | ICD-10-CM | POA: Diagnosis not present

## 2017-03-12 DIAGNOSIS — Z8546 Personal history of malignant neoplasm of prostate: Secondary | ICD-10-CM | POA: Diagnosis not present

## 2017-03-12 NOTE — Telephone Encounter (Signed)
Protime 14.3    INR 1.2

## 2017-03-12 NOTE — Telephone Encounter (Signed)
Subtherapeutic anticoagulation. Patient is to take extra '1mg'$  (total dose of '2mg'$ ) for the next 3 days. Then change warfarin dose to '2mg'$  qd except '1mg'$  MWF.  [facility] will recheck INR on Thursday, May 3.

## 2017-03-14 ENCOUNTER — Other Ambulatory Visit: Payer: Self-pay | Admitting: Pharmacist

## 2017-03-14 DIAGNOSIS — F17290 Nicotine dependence, other tobacco product, uncomplicated: Secondary | ICD-10-CM | POA: Diagnosis not present

## 2017-03-14 DIAGNOSIS — I129 Hypertensive chronic kidney disease with stage 1 through stage 4 chronic kidney disease, or unspecified chronic kidney disease: Secondary | ICD-10-CM | POA: Diagnosis not present

## 2017-03-14 DIAGNOSIS — Z8546 Personal history of malignant neoplasm of prostate: Secondary | ICD-10-CM | POA: Diagnosis not present

## 2017-03-14 DIAGNOSIS — F17210 Nicotine dependence, cigarettes, uncomplicated: Secondary | ICD-10-CM | POA: Diagnosis not present

## 2017-03-14 DIAGNOSIS — N183 Chronic kidney disease, stage 3 (moderate): Secondary | ICD-10-CM | POA: Diagnosis not present

## 2017-03-14 DIAGNOSIS — H3530 Unspecified macular degeneration: Secondary | ICD-10-CM | POA: Diagnosis not present

## 2017-03-14 DIAGNOSIS — J441 Chronic obstructive pulmonary disease with (acute) exacerbation: Secondary | ICD-10-CM | POA: Diagnosis not present

## 2017-03-14 DIAGNOSIS — I4891 Unspecified atrial fibrillation: Secondary | ICD-10-CM | POA: Diagnosis not present

## 2017-03-14 DIAGNOSIS — J09X2 Influenza due to identified novel influenza A virus with other respiratory manifestations: Secondary | ICD-10-CM | POA: Diagnosis not present

## 2017-03-15 ENCOUNTER — Telehealth: Payer: Self-pay | Admitting: *Deleted

## 2017-03-15 ENCOUNTER — Ambulatory Visit (INDEPENDENT_AMBULATORY_CARE_PROVIDER_SITE_OTHER): Payer: Medicare HMO | Admitting: Pharmacist

## 2017-03-15 DIAGNOSIS — Z8546 Personal history of malignant neoplasm of prostate: Secondary | ICD-10-CM | POA: Diagnosis not present

## 2017-03-15 DIAGNOSIS — J09X2 Influenza due to identified novel influenza A virus with other respiratory manifestations: Secondary | ICD-10-CM | POA: Diagnosis not present

## 2017-03-15 DIAGNOSIS — I4891 Unspecified atrial fibrillation: Secondary | ICD-10-CM | POA: Diagnosis not present

## 2017-03-15 DIAGNOSIS — H3530 Unspecified macular degeneration: Secondary | ICD-10-CM | POA: Diagnosis not present

## 2017-03-15 DIAGNOSIS — J441 Chronic obstructive pulmonary disease with (acute) exacerbation: Secondary | ICD-10-CM | POA: Diagnosis not present

## 2017-03-15 DIAGNOSIS — I480 Paroxysmal atrial fibrillation: Secondary | ICD-10-CM

## 2017-03-15 DIAGNOSIS — F17290 Nicotine dependence, other tobacco product, uncomplicated: Secondary | ICD-10-CM | POA: Diagnosis not present

## 2017-03-15 DIAGNOSIS — N183 Chronic kidney disease, stage 3 (moderate): Secondary | ICD-10-CM | POA: Diagnosis not present

## 2017-03-15 DIAGNOSIS — F17210 Nicotine dependence, cigarettes, uncomplicated: Secondary | ICD-10-CM | POA: Diagnosis not present

## 2017-03-15 DIAGNOSIS — I129 Hypertensive chronic kidney disease with stage 1 through stage 4 chronic kidney disease, or unspecified chronic kidney disease: Secondary | ICD-10-CM | POA: Diagnosis not present

## 2017-03-15 LAB — POCT INR: INR: 1.2

## 2017-03-15 NOTE — Telephone Encounter (Signed)
Per Don [name] with [facility] INR was called in this am but left on VM.  I reminded her that lab results should not be left on voicemail in case VM is not checked that day (employee absence or if they are pulled to work somewhere else) [employer] to confirm warfarin dose of '2mg'$  MWF and '1mg'$  all other days.   INR was subtherapeutic.  Warfarin increased to '2mg'$  daily (30% increase). [facility] will pick up patient's medication container and change to new dose as patient is suppose to start new weekly pack tomorrow.  INR to be recheked Monday, May 7th.

## 2017-03-15 NOTE — Telephone Encounter (Signed)
INR 1.2

## 2017-03-16 DIAGNOSIS — N183 Chronic kidney disease, stage 3 (moderate): Secondary | ICD-10-CM | POA: Diagnosis not present

## 2017-03-16 DIAGNOSIS — Z8546 Personal history of malignant neoplasm of prostate: Secondary | ICD-10-CM | POA: Diagnosis not present

## 2017-03-16 DIAGNOSIS — I4891 Unspecified atrial fibrillation: Secondary | ICD-10-CM | POA: Diagnosis not present

## 2017-03-16 DIAGNOSIS — F17210 Nicotine dependence, cigarettes, uncomplicated: Secondary | ICD-10-CM | POA: Diagnosis not present

## 2017-03-16 DIAGNOSIS — H3530 Unspecified macular degeneration: Secondary | ICD-10-CM | POA: Diagnosis not present

## 2017-03-16 DIAGNOSIS — J09X2 Influenza due to identified novel influenza A virus with other respiratory manifestations: Secondary | ICD-10-CM | POA: Diagnosis not present

## 2017-03-16 DIAGNOSIS — I129 Hypertensive chronic kidney disease with stage 1 through stage 4 chronic kidney disease, or unspecified chronic kidney disease: Secondary | ICD-10-CM | POA: Diagnosis not present

## 2017-03-16 DIAGNOSIS — F17290 Nicotine dependence, other tobacco product, uncomplicated: Secondary | ICD-10-CM | POA: Diagnosis not present

## 2017-03-16 DIAGNOSIS — J441 Chronic obstructive pulmonary disease with (acute) exacerbation: Secondary | ICD-10-CM | POA: Diagnosis not present

## 2017-03-19 DIAGNOSIS — Z8546 Personal history of malignant neoplasm of prostate: Secondary | ICD-10-CM | POA: Diagnosis not present

## 2017-03-19 DIAGNOSIS — N183 Chronic kidney disease, stage 3 (moderate): Secondary | ICD-10-CM | POA: Diagnosis not present

## 2017-03-19 DIAGNOSIS — J09X2 Influenza due to identified novel influenza A virus with other respiratory manifestations: Secondary | ICD-10-CM | POA: Diagnosis not present

## 2017-03-19 DIAGNOSIS — F17290 Nicotine dependence, other tobacco product, uncomplicated: Secondary | ICD-10-CM | POA: Diagnosis not present

## 2017-03-19 DIAGNOSIS — F17210 Nicotine dependence, cigarettes, uncomplicated: Secondary | ICD-10-CM | POA: Diagnosis not present

## 2017-03-19 DIAGNOSIS — J441 Chronic obstructive pulmonary disease with (acute) exacerbation: Secondary | ICD-10-CM | POA: Diagnosis not present

## 2017-03-19 DIAGNOSIS — I129 Hypertensive chronic kidney disease with stage 1 through stage 4 chronic kidney disease, or unspecified chronic kidney disease: Secondary | ICD-10-CM | POA: Diagnosis not present

## 2017-03-19 DIAGNOSIS — I4891 Unspecified atrial fibrillation: Secondary | ICD-10-CM | POA: Diagnosis not present

## 2017-03-19 DIAGNOSIS — H3530 Unspecified macular degeneration: Secondary | ICD-10-CM | POA: Diagnosis not present

## 2017-03-19 LAB — POCT INR: INR: 1.2

## 2017-03-20 ENCOUNTER — Ambulatory Visit (INDEPENDENT_AMBULATORY_CARE_PROVIDER_SITE_OTHER): Payer: Medicare HMO | Admitting: Pharmacist

## 2017-03-20 ENCOUNTER — Telehealth: Payer: Self-pay | Admitting: *Deleted

## 2017-03-20 DIAGNOSIS — I480 Paroxysmal atrial fibrillation: Secondary | ICD-10-CM

## 2017-03-20 MED ORDER — WARFARIN SODIUM 3 MG PO TABS: 3.0000 mg | ORAL_TABLET | Freq: Every day | ORAL | 0 refills | Status: DC

## 2017-03-20 NOTE — Telephone Encounter (Signed)
INR is subtherapeutic.  Recommend '4mg'$  warfarin for 3 days, then increase warfarin to '3mg'$  daily. Recheck INR in 2-3 days. Appt 03/23/17 at 11am - patient LM for [facility] with [facility].  LM for patient about appointment and also called his step son.  [facility] contacted and they will send our extra tablets needed.

## 2017-03-20 NOTE — Telephone Encounter (Signed)
INR from yesterday 1.2. She said she could never get through on switchboard. She also discharged him yesterday.

## 2017-03-23 ENCOUNTER — Ambulatory Visit (INDEPENDENT_AMBULATORY_CARE_PROVIDER_SITE_OTHER): Payer: Medicare HMO | Admitting: Pharmacist

## 2017-03-23 DIAGNOSIS — I480 Paroxysmal atrial fibrillation: Secondary | ICD-10-CM

## 2017-03-23 LAB — COAGUCHEK XS/INR WAIVED
INR: 1.5 — ABNORMAL HIGH (ref 0.9–1.1)
PROTHROMBIN TIME: 17.5 s

## 2017-03-23 NOTE — Patient Instructions (Signed)
Anticoagulation Warfarin Dose Instructions as of 03/23/2017      Don [name] Tue Wed Thu Fri Sat   New Dose 3 mg 3 mg 3 mg 3 mg 3 mg 3 mg 3 mg    Description   Take extra tablet today (5/11). Then continue to take '3mg'$  daily until next appointment on 5/18.  INR today is 1.5 (Goal 2-3) Blood is too thick.

## 2017-03-30 ENCOUNTER — Ambulatory Visit (INDEPENDENT_AMBULATORY_CARE_PROVIDER_SITE_OTHER): Payer: Medicare HMO | Admitting: Pharmacist

## 2017-03-30 DIAGNOSIS — I480 Paroxysmal atrial fibrillation: Secondary | ICD-10-CM | POA: Diagnosis not present

## 2017-03-30 LAB — COAGUCHEK XS/INR WAIVED
INR: 1.4 — ABNORMAL HIGH (ref 0.9–1.1)
PROTHROMBIN TIME: 17.2 s

## 2017-03-30 NOTE — Patient Instructions (Signed)
Anticoagulation Warfarin Dose Instructions as of 03/30/2017      [name] Tue Wed Thu Fri Sat   New Dose 3 mg 3 mg 4.5 mg 3 mg 4.5 mg 3 mg 3 mg    Description   Take extra tablet today (5/18). Then increase dose to take '3mg'$  daily, except take 1.5 tablets on Tuesday and Thursday.  INR today is 1.4 (Goal 2-3) Blood is too thick.       Buy Mucinex '600mg'$  twice daily for cough and congestion at the pharmacy.

## 2017-04-02 ENCOUNTER — Other Ambulatory Visit: Payer: Self-pay | Admitting: Family Medicine

## 2017-04-03 NOTE — Telephone Encounter (Signed)
Go ahead and call in refill 

## 2017-04-04 ENCOUNTER — Ambulatory Visit (INDEPENDENT_AMBULATORY_CARE_PROVIDER_SITE_OTHER): Payer: Medicare HMO | Admitting: Pharmacist

## 2017-04-04 DIAGNOSIS — I480 Paroxysmal atrial fibrillation: Secondary | ICD-10-CM

## 2017-04-04 LAB — COAGUCHEK XS/INR WAIVED
INR: 1.6 — AB (ref 0.9–1.1)
PROTHROMBIN TIME: 19.3 s

## 2017-04-04 NOTE — Patient Instructions (Signed)
You have a daily multiple vitamin in you medication containers - no need to also take Centrum Vitamin .   Look for Mucinex 600mg  (guaifenesin)  - take 1 tablet twice a day if needed - to help with cough and congestion.

## 2017-04-13 ENCOUNTER — Ambulatory Visit (INDEPENDENT_AMBULATORY_CARE_PROVIDER_SITE_OTHER): Payer: Medicare HMO | Admitting: Pharmacist Clinician (PhC)/ Clinical Pharmacy Specialist

## 2017-04-13 DIAGNOSIS — I480 Paroxysmal atrial fibrillation: Secondary | ICD-10-CM

## 2017-04-13 LAB — COAGUCHEK XS/INR WAIVED
INR: 1.2 — ABNORMAL HIGH (ref 0.9–1.1)
PROTHROMBIN TIME: 14.1 s

## 2017-04-13 NOTE — Patient Instructions (Signed)
Anticoagulation Warfarin Dose Instructions as of 04/13/2017      [name] Tue Wed Thu Fri Sat   New Dose 3 mg 3 mg 4.5 mg 3 mg 4.5 mg 3 mg 3 mg    Description   Continue current warfarin dose to take 3mg  daily, except take 1.5 tablets on Tuesday and Thursday.  INR today is 1.2 due to being off warfarin re-start on 6/3

## 2017-04-17 ENCOUNTER — Other Ambulatory Visit: Payer: Self-pay | Admitting: Pharmacist

## 2017-04-20 ENCOUNTER — Ambulatory Visit (INDEPENDENT_AMBULATORY_CARE_PROVIDER_SITE_OTHER): Payer: Medicare HMO | Admitting: Pharmacist

## 2017-04-20 ENCOUNTER — Encounter: Payer: Medicare HMO | Admitting: Pharmacist Clinician (PhC)/ Clinical Pharmacy Specialist

## 2017-04-20 ENCOUNTER — Encounter: Payer: Self-pay | Admitting: Pharmacist Clinician (PhC)/ Clinical Pharmacy Specialist

## 2017-04-20 DIAGNOSIS — I480 Paroxysmal atrial fibrillation: Secondary | ICD-10-CM | POA: Diagnosis not present

## 2017-04-20 LAB — COAGUCHEK XS/INR WAIVED
INR: 1.3 — ABNORMAL HIGH (ref 0.9–1.1)
Prothrombin Time: 16.1 s

## 2017-04-20 NOTE — Patient Instructions (Signed)
Anticoagulation Warfarin Dose Instructions as of 04/20/2017      Don [name] Tue Wed Thu Fri Sat   New Dose 3 mg 3 mg 4.5 mg 3 mg 4.5 mg 3 mg 3 mg    Description   Take an extra tablet today. Then take 3mg  daily, except take 1.5 tablets on Tuesday and Thursday.  Per step son medication pouches have already been filled by pharmacy.  INR was improving on this dose per last INR visit.  INR today is 1.3 but has only been on this regimen for 6 days. (Goal 2-3)

## 2017-05-04 ENCOUNTER — Ambulatory Visit (INDEPENDENT_AMBULATORY_CARE_PROVIDER_SITE_OTHER): Payer: Medicare HMO | Admitting: Pharmacist

## 2017-05-04 DIAGNOSIS — I480 Paroxysmal atrial fibrillation: Secondary | ICD-10-CM

## 2017-05-04 LAB — COAGUCHEK XS/INR WAIVED
INR: 1.7 — ABNORMAL HIGH (ref 0.9–1.1)
PROTHROMBIN TIME: 20.2 s

## 2017-05-04 NOTE — Patient Instructions (Signed)
Anticoagulation Warfarin Dose Instructions as of 05/04/2017      Sun Mon Tue Wed Thu Fri Sat   New Dose 4.5 mg 3 mg 4.5 mg 3 mg 4.5 mg 3 mg 3 mg    Description   Take an extra 1/2 tablet today. Then take 3mg  daily, except take 1.5 tablets on Sunday, Tuesday, and Thursday.  Per step son medication pouches have already been filled by pharmacy.  INR was improving on this dose per last INR visit.  INR today is 1.7 (Goal 2-3)

## 2017-05-09 ENCOUNTER — Other Ambulatory Visit: Payer: Self-pay | Admitting: Family

## 2017-05-18 ENCOUNTER — Ambulatory Visit (INDEPENDENT_AMBULATORY_CARE_PROVIDER_SITE_OTHER): Payer: Medicare HMO | Admitting: Pharmacist Clinician (PhC)/ Clinical Pharmacy Specialist

## 2017-05-18 DIAGNOSIS — I480 Paroxysmal atrial fibrillation: Secondary | ICD-10-CM | POA: Diagnosis not present

## 2017-05-18 DIAGNOSIS — L859 Epidermal thickening, unspecified: Secondary | ICD-10-CM | POA: Diagnosis not present

## 2017-05-18 LAB — COAGUCHEK XS/INR WAIVED
INR: 1.9 — ABNORMAL HIGH (ref 0.9–1.1)
PROTHROMBIN TIME: 22.9 s

## 2017-05-18 MED ORDER — TRIAMCINOLONE ACETONIDE 0.1 % EX CREA: 1.0000 "application " | TOPICAL_CREAM | Freq: Two times a day (BID) | CUTANEOUS | 1 refills | Status: DC

## 2017-05-18 NOTE — Patient Instructions (Signed)
Anticoagulation Warfarin Dose Instructions as of 05/18/2017      Don [name] Tue Wed Thu Fri Sat   New Dose 4.5 mg 3 mg 4.5 mg 3 mg 4.5 mg 3 mg 3 mg    Description   Continue taking warfarin the same way as listed above  INR today is 1.9 (Goal 2-3)

## 2017-05-21 ENCOUNTER — Encounter: Payer: Self-pay | Admitting: *Deleted

## 2017-05-30 ENCOUNTER — Telehealth: Payer: Self-pay | Admitting: Family Medicine

## 2017-05-30 NOTE — Telephone Encounter (Signed)
na

## 2017-05-30 NOTE — Telephone Encounter (Signed)
Please advise 

## 2017-06-01 ENCOUNTER — Ambulatory Visit (INDEPENDENT_AMBULATORY_CARE_PROVIDER_SITE_OTHER): Payer: Medicare HMO | Admitting: Pharmacist

## 2017-06-01 ENCOUNTER — Encounter: Payer: Self-pay | Admitting: Pharmacist

## 2017-06-01 DIAGNOSIS — J449 Chronic obstructive pulmonary disease, unspecified: Secondary | ICD-10-CM

## 2017-06-01 DIAGNOSIS — I480 Paroxysmal atrial fibrillation: Secondary | ICD-10-CM

## 2017-06-01 NOTE — Patient Instructions (Signed)
Anticoagulation Warfarin Dose Instructions as of 06/01/2017      [name] Tue Wed Thu Fri Sat   New Dose 4.5 mg 3 mg 4.5 mg 3 mg 4.5 mg 3 mg 3 mg    Description   Continue current warfarin dose - take 3mg  tablet, 1 and 1/2 tablets mon Sundays, Tuesdays and Thursdays.  Take 1 tablet all other days.  INR today is 2.1 (Goal 2-3)

## 2017-06-04 LAB — COAGUCHEK XS/INR WAIVED
INR: 2.1 — ABNORMAL HIGH (ref 0.9–1.1)
PROTHROMBIN TIME: 25 s

## 2017-06-05 ENCOUNTER — Other Ambulatory Visit: Payer: Self-pay | Admitting: *Deleted

## 2017-06-05 ENCOUNTER — Other Ambulatory Visit: Payer: Self-pay | Admitting: Family Medicine

## 2017-06-05 NOTE — Patient Outreach (Signed)
[facility]) Care Management  06/05/2017  Don [name] [birth year] [id number]  Referral from MD office/pharmacist -T Eckard; Please assess for resources for home visits/in home nursing care:  Telephone call to patient; left HIPPA compliant voice mail on home phone requesting call back.  Plan: Follow up.  [name], RN BSN [place] Management Coordinator [facility] Care Management  [phone]

## 2017-06-06 NOTE — Telephone Encounter (Signed)
Last seen 03/01/17  Dr Dettinger  IF approved route to nurse to call into [facility]

## 2017-06-06 NOTE — Telephone Encounter (Signed)
Go ahead and call in a refill for the patient.

## 2017-06-06 NOTE — Telephone Encounter (Signed)
Refill called to [facility] on clonazepam.

## 2017-06-07 ENCOUNTER — Other Ambulatory Visit: Payer: Self-pay | Admitting: *Deleted

## 2017-06-07 NOTE — Patient Outreach (Signed)
[place] [facility] [facility]) Care Management  06/07/2017  Don [name] 11/29/1940 [id number]   IMPORTANT- Don [name] is requesting that all calls come to him [phone].   Received return call from Don [name] [phone]) who was advised by patient to listen to his voicemail. States he could take call for patient because he has power of attorney.  Requested to talk with patient to get permission to speak with him. Advised that he was not at patient's home but this RN care coordinator could call patient at his home to get permission (advised that patient may not answer call). Telephone call to patient who answered call. HIPPA verification received from patient. Patient consents to take call & complete screening assessment. Patient consents to have Don [name] speak for him as needed regarding his health concerns.    Patient was advised of MD office referral & [facility] care management services.   Patient voices that his major health concern is that he has fallen 1-2 times in the last month. States he looses balance because he looses focus. States he has not received any injuries with the falls. States he does not use device for assistance to walk but does have a walker if needed.     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 Other conditions include COPD(stopped smoking several months ago, atrial fibrillation(on coumadin).  Patient states he gets delivery of his medications from local pharmacy in [place]. States medications are in bubble wrap & makes it easy for him to take medications as prescribed by his doctors.  Patient states he has primary care provider in [facility] but also attends [facility] in [place] if needed because he has VA benefits also.    Patient voices that he lives alone but that family grocery shops, does outside chores, takes him to MD  appointments, pays his bills & take care of other things for him.   Patient agrees to [facility] services. He gives [facility] personnel  permission to speak with Don [name] stepson who is his power of attorney about his health concerns.   Will refer to [facility] care coordinator for complex case management . Patient lives alone/is high falls risk due to balance problem-has macular degeneration/? Community resources.  This RN care coordinator contacted patient's power of attorney & advised that patient had accepted [facility] services . Advised that patient would be called regarding home visit.   Power of Don [name] is requesting that all calls come to him FIRST!  Don [name] [phone]-  Sent to care management assistant to refer to [facility] care coordinator.  Don Daisy, RN BSN [name] Management Coordinator [facility] Care Management  [phone]

## 2017-06-07 NOTE — Telephone Encounter (Signed)
[facility] is involvled

## 2017-06-08 ENCOUNTER — Other Ambulatory Visit: Payer: Self-pay | Admitting: Family Medicine

## 2017-06-12 ENCOUNTER — Other Ambulatory Visit: Payer: Self-pay | Admitting: *Deleted

## 2017-06-12 NOTE — Patient Outreach (Signed)
Telephone call to patient to schedule initial home visit, spoke with [name] POA, initial home visit scheduled for this week.  Jacqlyn [facility], [place] Coordinator [phone]

## 2017-06-14 ENCOUNTER — Encounter: Payer: Self-pay | Admitting: *Deleted

## 2017-06-14 ENCOUNTER — Other Ambulatory Visit: Payer: Self-pay | Admitting: *Deleted

## 2017-06-14 NOTE — Patient Outreach (Signed)
[facility]) Care Management   06/14/2017  Don [name] 11-26-1940 [id number]  Don [name] is an 76 y.o. male  Subjective: Initial home visit with pt, HIPAA verified, pt reports "I need someone to come in and clean up for me, clean my house"  Pt states he does not drive now and his family assists him with grocery shopping, etc.  Patient's wife is in SNF and his step children come by several times week and assist him.  Patient's family states application to [place] for assistance in the home has already been completed and they are waiting on response, patient's step son [name] will be checking with DSS about medicaid eligibility but does not think pt will qualify, step son refuses [facility] CSW services due to "that's too many people involved"  Pt states he has shower seat and walker but does not uses shower seat and rarely uses walker, has had "maybe 2 falls in past year"  Objective:   Vitals:   06/14/17 1229  BP: 136/66  Pulse: 70  Resp: 16  SpO2: 97%  Weight: 160 lb (72.6 kg)  Height: 1.753 m (5\' 9" )   ROS  Physical Exam  Constitutional: He is oriented to person, place, and time. He appears well-developed and well-nourished.  HENT:  Head: Normocephalic.  Neck: Normal range of motion. Neck supple.  Cardiovascular: Normal rate.   Irregular rhythm  Respiratory: Effort normal.  GI: Soft. Bowel sounds are normal.  Musculoskeletal: Normal range of motion. He exhibits edema.  Ankle edema bil  Neurological: He is alert and oriented to person, place, and time.  Skin: Skin is warm and dry.  Psychiatric: He has a normal mood and affect. His behavior is normal. Thought content normal.  Forgetful at times    Encounter Medications:   Outpatient Encounter Prescriptions as of 06/14/2017  Medication Sig Note  . albuterol (PROVENTIL HFA;VENTOLIN HFA) 108 (90 Base) MCG/ACT inhaler Inhale 2 puffs into the lungs every 6 (six) hours as needed for wheezing or shortness of breath.   .  ALLERGY RELIEF 10 MG tablet TAKE 1 TABLET DAILY FOR ALLERGY   . atorvastatin (LIPITOR) 20 MG tablet TAKE 1 TABLET DAILY   . bisoprolol (ZEBETA) 10 MG tablet Take 1 tablet (10 mg total) by mouth 2 (two) times daily.   . clonazePAM (KLONOPIN) 1 MG tablet TAKE 1/2 TABLET ONCE DAILY AS NEEDED FOR ANXIETY   . diltiazem (CARDIZEM CD) 180 MG 24 hr capsule Take 1 capsule (180 mg total) by mouth 2 (two) times daily.   [name] docusate sodium (COLACE) 50 MG capsule Take 50 mg by mouth daily as needed for mild constipation.   [name] doxylamine, Sleep, (UNISOM) 25 MG tablet Take 25 mg by mouth at bedtime as needed.   . DULoxetine (CYMBALTA) 60 MG capsule TAKE (1) CAPSULE DAILY   . FERREX 150 150 MG capsule Take 1 capsule by mouth daily. 07/23/2015: Received from: External Pharmacy Received Sig:   . Incontinence Supply Disposable (INCONTINENCE BRIEF LARGE) MISC Use as needed for urinary incontinence.  Dx: urinary incontinence R32 and prostate cancer C61   . Multiple Vitamin (MULTIVITAMIN WITH MINERALS) TABS Take 1 tablet by mouth daily.   [name] omega-3 acid ethyl esters (LOVAZA) 1 G capsule TAKE (2) CAPSULES TWICE DAILY.   . pantoprazole (PROTONIX) 40 MG tablet Take 1 tablet (40 mg total) by mouth 2 (two) times daily.   Vladimir Faster Glycol-Propyl Glycol (SYSTANE OP) Place 1 drop into both eyes 2 (two) times  daily as needed (dry eyes).    . polyethylene glycol (MIRALAX / GLYCOLAX) packet Take 17 g by mouth 2 (two) times daily. (Patient taking differently: Take 17 g by mouth daily as needed. )   . terazosin (HYTRIN) 2 MG capsule TAKE 1 CAPSULE AT BEDTIME   . tiotropium (SPIRIVA) 18 MCG inhalation capsule Place 1 capsule (18 mcg total) into inhaler and inhale daily.   [name] triamcinolone cream (KENALOG) 0.1 % Apply 1 application topically 2 (two) times daily.   [name] warfarin (COUMADIN) 3 MG tablet TAKE 1 TABLET DAILY    No facility-administered encounter medications on file as of 06/14/2017.     Functional Status:   In your present  state of health, do you have any difficulty performing the following activities: 06/14/2017 06/07/2017  Hearing? N N  Vision? Y N  Comment - -  Difficulty concentrating or making decisions? [name]  Walking or climbing stairs? Y Y  Dressing or bathing? N N  Doing errands, shopping? [name]  Preparing Food and eating ? N -  Using the Toilet? N -  In the past six months, have you accidently leaked urine? Y -  Comment wears depends -  Do you have problems with loss of bowel control? N -  Managing your Medications? Y -  Comment - -  Managing your Finances? Y -  Housekeeping or managing your Housekeeping? Y -  Some recent data might be hidden    Fall/Depression Screening:    Fall Risk  06/14/2017 06/07/2017 06/01/2017  Falls in the past year? Yes Yes Yes  Number falls in past yr: 2 or more 2 or more 1  Injury with Fall? No No No  Risk for fall due to : History of fall(s);Medication side effect - -  Follow up Falls prevention discussed;Education provided;Falls evaluation completed Falls prevention discussed Falls prevention discussed   PHQ 2/9 Scores 06/14/2017 06/07/2017 06/01/2017 01/26/2017 12/29/2016 09/20/2016 08/15/2016  PHQ - 2 Score 1 1 3  0 0 2 1  PHQ- 9 Score - - 8 - - 7 -    Assessment:  RN CM reviewed all medications with pt and step daughter [name] (medications are prepackaged and delivered), gave [facility] calendar and reviewed resources, COPD action plan in calendar, gave [facility] handouts and COPD folder for family to review.  RN CM reviewed resources such as nutrition site and LEAF center, [facility] Lucianne Lei and family states they already know about these resources, pt has decide when he wants to do these things.  Reviewed resources such as more affordable housing for older adults ([place]). Pt sees doctors at [facility] for eye exams, etc. Dorthula Rue and Jule Ser)  RN CM faxed barrier letter and initial home visit to primary care MD Dr. Warrick Parisian.  THN CM Care Plan Problem One     Most Recent Value   Care Plan Problem One  Knowledge deficit related to COPD  Role Documenting the Problem One  Care Management Coordinator  Care Plan for Problem One  Active  THN Long Term Goal   pt will demonstrate proper self care for better outcomes related to COPD within 60 days  THN Long Term Goal Start Date  06/14/17  Interventions for Problem One Long Term Goal  RN CM reviewed importance of taking all medications as prescribed, gave pt copy of COPD action plan and reviewed, importance of calling MD early for change in health status, symptom management  THN CM Short Term Goal #1   pt will verbalize  COPD action plan within 30 days  THN CM Short Term Goal #1 Start Date  06/14/17  Interventions for Short Term Goal #1  RN CM reviewed COPD action plan in [facility] calendar    [facility] CM Care Plan Problem Two     Most Recent Value  Care Plan Problem Two  Pt high risk for falls  Role Documenting the Problem Two  Care Management Coordinator  Care Plan for Problem Two  Active  THN CM Short Term Goal #1   Pt will use walker and shower seat and verbalize using safety precautions within 30 days  THN CM Short Term Goal #1 Start Date  06/14/17  Interventions for Short Term Goal #2   RN CM ask pt to keep walker nearby and use appropriately if off balance, ask pt to start using shower seat in shower (pt has on hand but does not use), ask pt to keep pathways clear and to ask for assistance if off balance.    [facility] CM Care Plan Problem Three     Most Recent Value  Care Plan Problem Three  Pt wants "someone to come in and clean my house"  Role Documenting the Problem Three  Care Management [place] for Problem Three  Active  THN CM Short Term Goal #1   patient's family member will follow up with VA, DSS about assistance in the home, application has been submitted to [place] and family states they don't think pt qualifies for medicaid but will check with case worker they are already familiar with for patient's wife, family  refuses [facility] CSW services.      Plan: see pt for home visit next month  Jacqlyn Larsen [facility], [place] Coordinator [phone]

## 2017-07-03 ENCOUNTER — Other Ambulatory Visit: Payer: Self-pay | Admitting: Family Medicine

## 2017-07-05 ENCOUNTER — Ambulatory Visit (INDEPENDENT_AMBULATORY_CARE_PROVIDER_SITE_OTHER): Payer: Medicare HMO | Admitting: Family Medicine

## 2017-07-05 ENCOUNTER — Encounter: Payer: Self-pay | Admitting: Family Medicine

## 2017-07-05 VITALS — BP 134/79 | HR 53 | Temp 96.8°F | Ht 69.0 in | Wt 163.5 lb

## 2017-07-05 DIAGNOSIS — E785 Hyperlipidemia, unspecified: Secondary | ICD-10-CM

## 2017-07-05 DIAGNOSIS — D689 Coagulation defect, unspecified: Secondary | ICD-10-CM

## 2017-07-05 DIAGNOSIS — J439 Emphysema, unspecified: Secondary | ICD-10-CM | POA: Diagnosis not present

## 2017-07-05 DIAGNOSIS — R7303 Prediabetes: Secondary | ICD-10-CM | POA: Diagnosis not present

## 2017-07-05 DIAGNOSIS — I480 Paroxysmal atrial fibrillation: Secondary | ICD-10-CM

## 2017-07-05 DIAGNOSIS — I10 Essential (primary) hypertension: Secondary | ICD-10-CM | POA: Diagnosis not present

## 2017-07-05 LAB — CMP14+EGFR
ALBUMIN: 3.7 g/dL (ref 3.5–4.8)
ALT: 12 IU/L (ref 0–44)
AST: 18 IU/L (ref 0–40)
Albumin/Globulin Ratio: 1.9 (ref 1.2–2.2)
Alkaline Phosphatase: 87 IU/L (ref 39–117)
BILIRUBIN TOTAL: 0.3 mg/dL (ref 0.0–1.2)
BUN / CREAT RATIO: 12 (ref 10–24)
BUN: 29 mg/dL — AB (ref 8–27)
CO2: 24 mmol/L (ref 20–29)
CREATININE: 2.52 mg/dL — AB (ref 0.76–1.27)
Calcium: 9.1 mg/dL (ref 8.6–10.2)
Chloride: 104 mmol/L (ref 96–106)
GFR calc non Af Amer: 24 mL/min/{1.73_m2} — ABNORMAL LOW (ref >59)
GFR, EST AFRICAN AMERICAN: 27 mL/min/{1.73_m2} — AB (ref >59)
GLOBULIN, TOTAL: 2 g/dL (ref 1.5–4.5)
GLUCOSE: 94 mg/dL (ref 65–99)
Potassium: 5 mmol/L (ref 3.5–5.2)
SODIUM: 142 mmol/L (ref 134–144)
TOTAL PROTEIN: 5.7 g/dL — AB (ref 6.0–8.5)

## 2017-07-05 LAB — CBC WITH DIFFERENTIAL/PLATELET
BASOS ABS: 0 10*3/uL (ref 0.0–0.2)
Basos: 0 %
EOS (ABSOLUTE): 0.3 10*3/uL (ref 0.0–0.4)
EOS: 4 %
HEMATOCRIT: 32.2 % — AB (ref 37.5–51.0)
HEMOGLOBIN: 10.6 g/dL — AB (ref 13.0–17.7)
IMMATURE GRANS (ABS): 0.1 10*3/uL (ref 0.0–0.1)
Immature Granulocytes: 1 %
LYMPHS ABS: 2 10*3/uL (ref 0.7–3.1)
LYMPHS: 25 %
MCH: 30.4 pg (ref 26.6–33.0)
MCHC: 32.9 g/dL (ref 31.5–35.7)
MCV: 92 fL (ref 79–97)
MONOCYTES: 10 %
Monocytes Absolute: 0.8 10*3/uL (ref 0.1–0.9)
NEUTROS ABS: 4.9 10*3/uL (ref 1.4–7.0)
Neutrophils: 60 %
Platelets: 211 10*3/uL (ref 150–379)
RBC: 3.49 x10E6/uL — ABNORMAL LOW (ref 4.14–5.80)
RDW: 16.2 % — ABNORMAL HIGH (ref 12.3–15.4)
WBC: 8.1 10*3/uL (ref 3.4–10.8)

## 2017-07-05 LAB — BAYER DCA HB A1C WAIVED: HB A1C (BAYER DCA - WAIVED): 6 % (ref <7.0)

## 2017-07-05 LAB — COAGUCHEK XS/INR WAIVED
INR: 1.5 — ABNORMAL HIGH (ref 0.9–1.1)
Prothrombin Time: 17.6 s

## 2017-07-05 MED ORDER — NYSTATIN 100000 UNIT/ML MT SUSP: 5.0000 mL | Freq: Four times a day (QID) | OROMUCOSAL | 2 refills | Status: DC

## 2017-07-05 MED ORDER — TRIAMCINOLONE ACETONIDE 0.1 % EX CREA: 1.0000 "application " | TOPICAL_CREAM | Freq: Two times a day (BID) | CUTANEOUS | 2 refills | Status: DC

## 2017-07-05 NOTE — Patient Instructions (Signed)
Anticoagulation Warfarin Dose Instructions as of 07/05/2017      Don [name] Tue Wed Thu Fri Sat   New Dose 4.5 mg 3 mg 4.5 mg 4.5 mg 4.5 mg 3 mg 4.5 mg    Description   Continue current warfarin dose - take 3mg  tablet, 1 and 1/2 tablets mon Sundays, Tuesdays, Wednesdays and Thursdays and Saturdays.  Take 1 tablet all other days.  INR today is 1.5 (Goal 2-3) Return in about 4 weeks for Coumadin recheck

## 2017-07-05 NOTE — Progress Notes (Signed)
BP 134/79   Pulse (!) 53   Temp (!) 96.8 F (36 C) (Oral)   Ht 5' 9" (1.753 m)   Wt 163 lb 8 oz (74.2 kg)   BMI 24.14 kg/m    Subjective:    Patient ID: Don [name], male    DOB: 06-26-1941, 76 y.o.   MRN: [id number]  HPI: Don [name] is a 76 y.o. male presenting on 07/05/2017 for Hyperlipidemia (followup, ); Hypertension; COPD; and Atrial Fibrillation (PT/INR check today; is it okay if he takes tylenol for neck pain?)   HPI Hyperlipidemia Patient is coming in for recheck of his hyperlipidemia. The patient is currently taking Lipitor. They deny any issues with myalgias or history of liver damage from it. They deny any focal numbness or weakness or chest pain.   Hypertension Patient is currently on bisoprolol, and their blood pressure today is 134/79. Patient denies any lightheadedness or dizziness. Patient denies headaches, blurred vision, chest pains, shortness of breath, or weakness. Denies any side effects from medication and is content with current medication.   Prediabetes Patient comes in today for recheck of his diabetes. Patient has been currently taking diet controlled. Patient is not currently on an ACE inhibitor/ARB. Patient has not seen an ophthalmologist this year. Patient denies any issues with their feet.   COPD Patient is coming in for recheck of COPD. He is currently on Spiriva and albuterol as needed. He says he has not been using albuterol very frequently and has been doing very well on the Spiriva. They deny any major shortness of breath or wheezing or chest pains.  A. fib and coagulopathy and Coumadin recheck Patient is coming in for Coumadin recheck for his A. fib. He still is in A. fib and is chronically in and out of A. fib. He denies any chest pain or palpitations and flutters.  Relevant past medical, surgical, family and social history reviewed and updated as indicated. Interim medical history since our last visit reviewed. Allergies and medications  reviewed and updated.  Review of Systems  Constitutional: Negative for chills and fever.  Eyes: Negative for discharge.  Respiratory: Negative for cough, shortness of breath and wheezing.   Cardiovascular: Negative for chest pain and leg swelling.  Gastrointestinal: Negative for blood in stool.  Musculoskeletal: Negative for back pain and gait problem.  Skin: Negative for rash.  Neurological: Negative for dizziness, weakness, light-headedness, numbness and headaches.  All other systems reviewed and are negative.   Per HPI unless specifically indicated above     Objective:    BP 134/79   Pulse (!) 53   Temp (!) 96.8 F (36 C) (Oral)   Ht 5' 9" (1.753 m)   Wt 163 lb 8 oz (74.2 kg)   BMI 24.14 kg/m   Wt Readings from Last 3 Encounters:  07/05/17 163 lb 8 oz (74.2 kg)  06/14/17 160 lb (72.6 kg)  03/01/17 151 lb (68.5 kg)    Physical Exam  Anticoagulation Warfarin Dose Instructions as of 07/05/2017      [name] Tue Wed Thu Fri Sat   New Dose 4.5 mg 3 mg 4.5 mg 4.5 mg 4.5 mg 3 mg 4.5 mg    Description   Continue current warfarin dose - take 21m tablet, 1 and 1/2 tablets mon Sundays, Tuesdays, Wednesdays and Thursdays and Saturdays.  Take 1 tablet all other days.  INR today is 1.5 (Goal 2-3) Return in about 4 weeks for Coumadin recheck  Assessment & Plan:   Problem List Items Addressed This Visit      Cardiovascular and Mediastinum   Hypertension   Paroxysmal atrial fibrillation (HCC)   Relevant Orders   CMP14+EGFR (Completed)   CoaguChek XS/INR Waived (Completed)     Respiratory   COPD (chronic obstructive pulmonary disease) (HCC)     Hematopoietic and Hemostatic   Coagulopathy (HCC)   Relevant Orders   CoaguChek XS/INR Waived (Completed)     Other   Hyperlipidemia LDL goal <130 - Primary   Pre-diabetes   Relevant Orders   CBC with Differential/Platelet (Completed)   Bayer DCA Hb A1c Waived (Completed)     Everything is stable except Coumadin,  will check labs at visit.   Follow up plan: Return if symptoms worsen or fail to improve.  Counseling provided for all of the vaccine components No orders of the defined types were placed in this encounter.    , MD [facility] 07/05/2017, 10:13 AM     

## 2017-07-10 ENCOUNTER — Other Ambulatory Visit: Payer: Self-pay | Admitting: Family Medicine

## 2017-07-17 ENCOUNTER — Encounter: Payer: Self-pay | Admitting: *Deleted

## 2017-07-17 ENCOUNTER — Other Ambulatory Visit: Payer: Self-pay | Admitting: *Deleted

## 2017-07-17 NOTE — Patient Outreach (Addendum)
[facility]) Care Management   07/17/2017  Don [name] August 18, 1941 [id number]  Don [name] is an 76 y.o. male  Subjective: Routine home visit with pt, HIPAA verified, patient's daughter Don [name] present, pt reports no changes with medications, no falls, appetite good, pt reports he went to [facility] center for few days and transported there by Ander Slade and states " I didn't like it, it's just not for me"  Pt states his son is keeping in touch with VA and DSS about any benefits.  Family continues to assist pt with shopping, house work, Social research officer, government.  Pt continues not to smoke cigarettes.  Objective:   Vitals:   07/17/17 1206  BP: 140/64  Pulse: 95  Resp: 18  SpO2: 97%   ROS  Physical Exam  Constitutional: He is oriented to person, place, and time. He appears well-developed and well-nourished.  HENT:  Head: Normocephalic.  Neck: Normal range of motion. Neck supple.  Cardiovascular: Normal rate.   Respiratory: Effort normal.  tracheo bronchial secretions auscultated, clears with coughing.  GI: Soft. Bowel sounds are normal.  Musculoskeletal: Normal range of motion. He exhibits edema.  1+ edema lower extremities bil  Neurological: He is alert and oriented to person, place, and time.  Skin: Skin is warm and dry.  Psychiatric: He has a normal mood and affect. His behavior is normal. Thought content normal.  Forgetful at times    Encounter Medications:   Outpatient Encounter Prescriptions as of 07/17/2017  Medication Sig Note  . albuterol (PROVENTIL HFA;VENTOLIN HFA) 108 (90 Base) MCG/ACT inhaler Inhale 2 puffs into the lungs every 6 (six) hours as needed for wheezing or shortness of breath.   . ALLERGY RELIEF 10 MG tablet TAKE 1 TABLET DAILY FOR ALLERGY   . atorvastatin (LIPITOR) 20 MG tablet TAKE 1 TABLET DAILY   . bisoprolol (ZEBETA) 10 MG tablet Take 1 tablet (10 mg total) by mouth 2 (two) times daily.   . clonazePAM (KLONOPIN) 1 MG tablet TAKE 1/2 TABLET ONCE DAILY AS  NEEDED FOR ANXIETY   . diltiazem (CARDIZEM CD) 180 MG 24 hr capsule Take 1 capsule (180 mg total) by mouth 2 (two) times daily.   [name] docusate sodium (COLACE) 50 MG capsule Take 50 mg by mouth daily as needed for mild constipation.   [name] doxylamine, Sleep, (UNISOM) 25 MG tablet Take 25 mg by mouth at bedtime as needed.   . DULoxetine (CYMBALTA) 60 MG capsule TAKE (1) CAPSULE DAILY   . FERREX 150 150 MG capsule Take 1 capsule by mouth daily. 07/23/2015: Received from: External Pharmacy Received Sig:   . Incontinence Supply Disposable (INCONTINENCE BRIEF LARGE) MISC Use as needed for urinary incontinence.  Dx: urinary incontinence R32 and prostate cancer C61   . Multiple Vitamin (MULTIVITAMIN WITH MINERALS) TABS Take 1 tablet by mouth daily.   [name] nystatin (MYCOSTATIN) 100000 UNIT/ML suspension Take 5 mLs (500,000 Units total) by mouth 4 (four) times daily.   [name] omega-3 acid ethyl esters (LOVAZA) 1 G capsule TAKE (2) CAPSULES TWICE DAILY.   . pantoprazole (PROTONIX) 40 MG tablet Take 1 tablet (40 mg total) by mouth 2 (two) times daily.   . polyethylene glycol (MIRALAX / GLYCOLAX) packet Take 17 g by mouth 2 (two) times daily. (Patient taking differently: Take 17 g by mouth daily as needed. )   . terazosin (HYTRIN) 2 MG capsule TAKE 1 CAPSULE AT BEDTIME   . tiotropium (SPIRIVA) 18 MCG inhalation capsule Place 1 capsule (18 mcg total)  into inhaler and inhale daily.   [name] triamcinolone cream (KENALOG) 0.1 % Apply 1 application topically 2 (two) times daily.   [name] warfarin (COUMADIN) 3 MG tablet TAKE 1 TABLET DAILY OR AS DIRECTED    No facility-administered encounter medications on file as of 07/17/2017.     Functional Status:   In your present state of health, do you have any difficulty performing the following activities: 06/14/2017 06/07/2017  Hearing? N N  Vision? Y N  Comment - -  Difficulty concentrating or making decisions? [name]  Walking or climbing stairs? Y Y  Dressing or bathing? N N  Doing errands,  shopping? [name]  Preparing Food and eating ? N -  Using the Toilet? N -  In the past six months, have you accidently leaked urine? Y -  Comment wears depends -  Do you have problems with loss of bowel control? N -  Managing your Medications? Y -  Comment - -  Managing your Finances? Y -  Housekeeping or managing your Housekeeping? Y -  Some recent data might be hidden    Fall/Depression Screening:    Fall Risk  07/05/2017 06/14/2017 06/07/2017  Falls in the past year? Yes Yes Yes  Number falls in past yr: 1 2 or more 2 or more  Injury with Fall? No No No  Risk for fall due to : - History of fall(s);Medication side effect -  Follow up - Falls prevention discussed;Education provided;Falls evaluation completed Falls prevention discussed   PHQ 2/9 Scores 07/05/2017 06/14/2017 06/07/2017 06/01/2017 01/26/2017 12/29/2016 09/20/2016  PHQ - 2 Score _0 0 0 2  PHQ- 9 Score - - - 8 - - 7    Assessment:  Pt managing well at home with assistance of family,  Family continues to explore options for pt such as LEAF center, etc.  Family continues to refuse services provided by [facility] CSW and feels they do not need this at present as they already have social worker/ DSS worker they work with ongoing. RN CM discussed discharge plan with pt and daughter Don [name] and will see pt next month for home visit and discharge or transfer to health coach.  THN CM Care Plan Problem One     Most Recent Value  Care Plan Problem One  Knowledge deficit related to COPD  Role Documenting the Problem One  Care Management Coordinator  Care Plan for Problem One  Active  THN Long Term Goal   pt will demonstrate proper self care for better outcomes related to COPD within 60 days  THN Long Term Goal Start Date  06/14/17  Interventions for Problem One Long Term Goal  RN CM reviewed medications with pt and reiterated importance of taking as prescribed  THN CM Short Term Goal #1   pt will verbalize COPD action plan within 30 days  THN CM  Short Term Goal #1 Start Date  07/17/17  Interventions for Short Term Goal #1  RN CM reinforced COPD action plan in [facility] calendar    [facility] CM Care Plan Problem Two     Most Recent Value  Care Plan Problem Two  Pt high risk for falls  Role Documenting the Problem Two  Care Management Coordinator  Care Plan for Problem Two  Active  THN CM Short Term Goal #1   Pt will use walker and shower seat and verbalize using safety precautions within 30 days  THN CM Short Term Goal #1 Start Date  07/17/17 [  goal restarted]  Interventions for Short Term Goal #2   RN CM reinforced safety precautions, pt has had no falls in the past month, pt needs reinforcement    [facility] CM Care Plan Problem Three     Most Recent Value  Care Plan Problem Three  Pt wants "someone to come in and clean my house"  Role Documenting the Problem Three  Care Management [place] for Problem Three  Active  THN CM Short Term Goal #1   patient's family member will follow up with VA, DSS about assistance in the home, application has been submitted to [place] and family states they don't think pt qualifies for medicaid but will check with case worker they are already familiar with for patient's wife, family refuses [facility] CSW services.  THN CM Short Term Goal #1 Met Date  07/17/17  Interventions for Short Term Goal #1  pt tried [facility] center and has decided he does not want to participate      Plan: follow up with home visit next month  Jacqlyn Larsen [facility], [place] Coordinator [phone]

## 2017-07-18 ENCOUNTER — Other Ambulatory Visit: Payer: Self-pay | Admitting: Family Medicine

## 2017-07-20 ENCOUNTER — Telehealth: Payer: Self-pay | Admitting: Family Medicine

## 2017-07-26 ENCOUNTER — Ambulatory Visit: Payer: Self-pay | Admitting: Pharmacist

## 2017-08-07 ENCOUNTER — Other Ambulatory Visit: Payer: Self-pay | Admitting: Family Medicine

## 2017-08-08 NOTE — Telephone Encounter (Signed)
Go ahead and call in Klonopin, will send the rest over

## 2017-08-08 NOTE — Telephone Encounter (Signed)
last seen 07/05/17  Dr Dettinger  If approved route to nurse to call into [facility]

## 2017-08-08 NOTE — Telephone Encounter (Signed)
Phoned in.

## 2017-08-14 ENCOUNTER — Other Ambulatory Visit: Payer: Self-pay | Admitting: *Deleted

## 2017-08-14 ENCOUNTER — Encounter: Payer: Self-pay | Admitting: *Deleted

## 2017-08-14 NOTE — Patient Outreach (Signed)
[facility]) Care Management   08/14/2017  Don [name] 11-28-1940 [id number]  Don [name] is an 76 y.o. male  Subjective: Routine home visit with pt, HIPAA verified, daughter [name] present and reports " he's doing well, no falls, no medication changes"  Pt reports he is eating well, continues to do well with smoking cessation.  Pt reports " I'm getting outside in my shop and doing things"    Objective:   Vitals:   08/14/17 1352  BP: 118/62  Pulse: 65  Resp: 16  SpO2: 98%  Weight: 163 lb (73.9 kg)   ROS  Physical Exam  Constitutional: He is oriented to person, place, and time. He appears well-developed and well-nourished.  HENT:  Head: Normocephalic.  Neck: Normal range of motion. Neck supple.  Cardiovascular: Normal rate.   Respiratory: Effort normal and breath sounds normal.  GI: Soft. Bowel sounds are normal.  Musculoskeletal: Normal range of motion. He exhibits no edema.  Neurological: He is alert and oriented to person, place, and time.  Skin: Skin is warm and dry.  Psychiatric: He has a normal mood and affect. His behavior is normal. Thought content normal.    Encounter Medications:   Outpatient Encounter Prescriptions as of 08/14/2017  Medication Sig Note  . albuterol (PROVENTIL HFA;VENTOLIN HFA) 108 (90 Base) MCG/ACT inhaler Inhale 2 puffs into the lungs every 6 (six) hours as needed for wheezing or shortness of breath.   . ALLERGY RELIEF 10 MG tablet TAKE 1 TABLET DAILY FOR ALLERGY   . atorvastatin (LIPITOR) 20 MG tablet TAKE 1 TABLET DAILY   . bisoprolol (ZEBETA) 10 MG tablet Take 1 tablet (10 mg total) by mouth 2 (two) times daily.   . clonazePAM (KLONOPIN) 1 MG tablet TAKE 1/2 TABLET ONCE DAILY AS NEEDED FOR ANXIETY   . diltiazem (CARDIZEM CD) 180 MG 24 hr capsule Take 1 capsule (180 mg total) by mouth 2 (two) times daily.   [name] docusate sodium (COLACE) 50 MG capsule Take 50 mg by mouth daily as needed for mild constipation.   [name] doxylamine,  Sleep, (UNISOM) 25 MG tablet Take 25 mg by mouth at bedtime as needed.   . DULoxetine (CYMBALTA) 60 MG capsule TAKE (1) CAPSULE DAILY   . FERREX 150 150 MG capsule Take 1 capsule by mouth daily. 07/23/2015: Received from: External Pharmacy Received Sig:   . Incontinence Supply Disposable (INCONTINENCE BRIEF LARGE) MISC Use as needed for urinary incontinence.  Dx: urinary incontinence R32 and prostate cancer C61   . Multiple Vitamin (MULTIVITAMIN WITH MINERALS) TABS Take 1 tablet by mouth daily.   [name] omega-3 acid ethyl esters (LOVAZA) 1 G capsule TAKE (2) CAPSULES TWICE DAILY.   . pantoprazole (PROTONIX) 40 MG tablet Take 1 tablet (40 mg total) by mouth 2 (two) times daily.   . polyethylene glycol (MIRALAX / GLYCOLAX) packet Take 17 g by mouth 2 (two) times daily. (Patient taking differently: Take 17 g by mouth daily as needed. )   . terazosin (HYTRIN) 2 MG capsule TAKE 1 CAPSULE AT BEDTIME   . tiotropium (SPIRIVA) 18 MCG inhalation capsule Place 1 capsule (18 mcg total) into inhaler and inhale daily.   [name] triamcinolone cream (KENALOG) 0.1 % Apply 1 application topically 2 (two) times daily.   [name] warfarin (COUMADIN) 3 MG tablet TAKE 1 TABLET DAILY OR AS DIRECTED   . nystatin (MYCOSTATIN) 100000 UNIT/ML suspension Take 5 mLs (500,000 Units total) by mouth 4 (four) times daily. (Patient not taking: Reported  on 08/14/2017)    No facility-administered encounter medications on file as of 08/14/2017.     Functional Status:   In your present state of health, do you have any difficulty performing the following activities: 06/14/2017 06/07/2017  Hearing? N N  Vision? Y N  Difficulty concentrating or making decisions? [name]  Walking or climbing stairs? Y Y  Dressing or bathing? N N  Doing errands, shopping? [name]  Preparing Food and eating ? N -  Using the Toilet? N -  In the past six months, have you accidently leaked urine? Y -  Comment wears depends -  Do you have problems with loss of bowel control? N -   Managing your Medications? Y -  Managing your Finances? Y -  Housekeeping or managing your Housekeeping? Y -  Some recent data might be hidden    Fall/Depression Screening:    Fall Risk  08/14/2017 07/05/2017 06/14/2017  Falls in the past year? Yes Yes Yes  Number falls in past yr: 2 or more 1 2 or more  Injury with Fall? Yes No No  Risk for fall due to : History of fall(s);Medication side effect - History of fall(s);Medication side effect  Follow up Falls evaluation completed;Education provided;Falls prevention discussed - Falls prevention discussed;Education provided;Falls evaluation completed   PHQ 2/9 Scores 07/05/2017 06/14/2017 06/07/2017 06/01/2017 01/26/2017 12/29/2016 09/20/2016  PHQ - 2 Score _0 0 0 2  PHQ- 9 Score - - - 8 - - 7    Assessment:  RN CM talked with pt about safety precautions, medication review, Pt continues attending all MD appointments, care at [facility].  Debbie states pt not making any further changes regarding care in the home, does not qualify for any additional assistance that would be free of charge (per family).  Family continues to assist pt.  RN CM mailed case closure letter to patient's home, faxed case closure letter to primary MD Dr. Warrick Parisian.  THN CM Care Plan Problem One     Most Recent Value  Care Plan Problem One  Knowledge deficit related to COPD  Role Documenting the Problem One  Care Management Coordinator  Care Plan for Problem One  Active  THN Long Term Goal   pt will demonstrate proper self care for better outcomes related to COPD within 60 days  THN Long Term Goal Start Date  06/14/17  [facility] Long Term Goal Met Date  08/14/17  Interventions for Problem One Long Term Goal  RN CM reiterated importance of taking all medications as prescribed.  THN CM Short Term Goal #1   pt will verbalize COPD action plan within 30 days  THN CM Short Term Goal #1 Start Date  07/17/17  [facility] CM Short Term Goal #1 Met Date  08/14/17  Interventions for Short Term Goal #1   RN CM reinforced/ reviewed COPD action plan in [facility] calendar    [facility] CM Care Plan Problem Two     Most Recent Value  Care Plan Problem Two  Pt high risk for falls  Role Documenting the Problem Two  Care Management Coordinator  Care Plan for Problem Two  Active  THN CM Short Term Goal #1   Pt will use walker and shower seat and verbalize using safety precautions within 30 days  THN CM Short Term Goal #1 Start Date  07/17/17 [goal restarted]  [facility] CM Short Term Goal #1 Met Date   08/14/17  Interventions for Short Term Goal #2  RN CM reinforced safety precautions, pt has had no falls in the past month,    THN CM Care Plan Problem Three     Most Recent Value  Care Plan Problem Three  Pt wants "someone to come in and clean my house"  Role Documenting the Problem Three  Care Management [place] for Problem Three  Active  THN CM Short Term Goal #1   patient's family member will follow up with [facility], DSS about assistance in the home, application has been submitted to [place] and family states they don't think pt qualifies for medicaid but will check with case worker they are already familiar with for patient's wife, family refuses [facility] CSW services.  THN CM Short Term Goal #1 Met Date  07/17/17  Interventions for Short Term Goal #1  pt tried [facility] and has decided he does not want to participate      Plan: close case today  Jacqlyn Larsen [facility], BSN [place] Coordinator [phone]

## 2017-08-28 ENCOUNTER — Other Ambulatory Visit: Payer: Self-pay | Admitting: Family Medicine

## 2017-09-04 ENCOUNTER — Other Ambulatory Visit: Payer: Self-pay | Admitting: Family Medicine

## 2017-09-17 ENCOUNTER — Other Ambulatory Visit: Payer: Self-pay | Admitting: Family Medicine

## 2017-09-28 ENCOUNTER — Other Ambulatory Visit: Payer: Self-pay | Admitting: Family Medicine

## 2017-09-28 NOTE — Telephone Encounter (Signed)
Go ahead and call in refill 

## 2017-09-28 NOTE — Telephone Encounter (Signed)
Phoned in.

## 2017-10-01 ENCOUNTER — Ambulatory Visit (INDEPENDENT_AMBULATORY_CARE_PROVIDER_SITE_OTHER): Payer: Medicare HMO | Admitting: Family Medicine

## 2017-10-01 ENCOUNTER — Encounter: Payer: Self-pay | Admitting: Family Medicine

## 2017-10-01 VITALS — BP 139/85 | HR 97 | Temp 98.4°F | Ht 69.0 in | Wt 159.0 lb

## 2017-10-01 DIAGNOSIS — I480 Paroxysmal atrial fibrillation: Secondary | ICD-10-CM | POA: Diagnosis not present

## 2017-10-01 DIAGNOSIS — N183 Chronic kidney disease, stage 3 unspecified: Secondary | ICD-10-CM

## 2017-10-01 DIAGNOSIS — Z720 Tobacco use: Secondary | ICD-10-CM

## 2017-10-01 DIAGNOSIS — R7303 Prediabetes: Secondary | ICD-10-CM

## 2017-10-01 DIAGNOSIS — J439 Emphysema, unspecified: Secondary | ICD-10-CM

## 2017-10-01 DIAGNOSIS — I10 Essential (primary) hypertension: Secondary | ICD-10-CM

## 2017-10-01 DIAGNOSIS — E785 Hyperlipidemia, unspecified: Secondary | ICD-10-CM

## 2017-10-01 LAB — COAGUCHEK XS/INR WAIVED
INR: 1.2 — AB (ref 0.9–1.1)
PROTHROMBIN TIME: 14.5 s

## 2017-10-01 NOTE — Patient Instructions (Signed)
Description   Change current warfarin dose - take 3mg  tablet, 1 tablets mon Sundays, Tuesdays, Thursdays and Saturdays.  Take 1.5 3 mg tablet all other days.  INR today is 1.2 (Goal 2-3) Return in about 1 weeks for Coumadin recheck

## 2017-10-01 NOTE — Progress Notes (Signed)
BP 139/85   Pulse 97   Temp 98.4 F (36.9 C) (Oral)   Ht _0  (1.753 m)   Wt 159 lb (72.1 kg)   BMI 23.48 kg/m    Subjective:    Patient ID: Don [name], male    DOB: 05/01/1941, 76 y.o.   MRN: [id number]  HPI: Don [name] is a 76 y.o. male presenting on 10/01/2017 for [facility] paperwork and Lesion on left arm (will not heal, has been biopsed and not cancerous)   HPI Prediabetes Patient comes in today for recheck of his diabetes. Patient has been currently taking no medication and is on diet control for now. Patient is not currently on an ACE inhibitor/ARB because of his known renal disease. Patient has not seen an ophthalmologist this year. Patient denies any issues with their feet.  Patient has known stage III CKD but has been stable, we will recheck levels.  Hypertension and A. fib Patient is currently on diltiazem and bisoprolol for blood pressure and A. fib, he is also on Coumadin for stroke prevention, and their blood pressure today is 139/85 and pulse is 97 and regular. Patient denies any lightheadedness or dizziness. Patient denies headaches, blurred vision, chest pains, shortness of breath, or weakness. Denies any side effects from medication and is content with current medication.   Hyperlipidemia Patient is coming in for recheck of his hyperlipidemia. The patient is currently taking fish oil and Lipitor. They deny any issues with myalgias or history of liver damage from it. They deny any focal numbness or weakness or chest pain.   COPD recheck Patient is coming in for a COPD recheck.  He is still smoking and has no desires to quit.  He says that his breathing has been doing fine and he rarely has to use his rescue inhaler and is happy with where he is at.  He denies any major shortness of breath or wheezing more than his baseline.  Patient has a couple of spots that he seen dermatology for on his left arm that are not healing well but of already been sampled and found to  be noncancerous but just wants to know how to get them to heal.  Relevant past medical, surgical, family and social history reviewed and updated as indicated. Interim medical history since our last visit reviewed. Allergies and medications reviewed and updated.  Review of Systems  Constitutional: Negative for chills and fever.  HENT: Negative for congestion and rhinorrhea.   Eyes: Negative for discharge.  Respiratory: Positive for cough. Negative for chest tightness, shortness of breath and wheezing.   Cardiovascular: Negative for chest pain, palpitations and leg swelling.  Genitourinary: Negative for decreased urine volume and difficulty urinating.  Musculoskeletal: Negative for back pain and gait problem.  Skin: Negative for rash.  Neurological: Negative for dizziness and light-headedness.  All other systems reviewed and are negative.   Per HPI unless specifically indicated above     Objective:    BP 139/85   Pulse 97   Temp 98.4 F (36.9 C) (Oral)   Ht _1  (1.753 m)   Wt 159 lb (72.1 kg)   BMI 23.48 kg/m   Wt Readings from Last 3 Encounters:  10/01/17 159 lb (72.1 kg)  08/14/17 163 lb (73.9 kg)  07/05/17 163 lb 8 oz (74.2 kg)    Physical Exam  Constitutional: He is oriented to person, place, and time. He appears well-developed and well-nourished. No distress.  Eyes: Conjunctivae are normal. No  scleral icterus.  Neck: Neck supple. No thyromegaly present.  Cardiovascular: Normal rate, regular rhythm, normal heart sounds and intact distal pulses.  No murmur heard. Pulmonary/Chest: Effort normal. No respiratory distress. He has wheezes. He has no rales.  Musculoskeletal: Normal range of motion. He exhibits no edema.  Lymphadenopathy:    He has no cervical adenopathy.  Neurological: He is alert and oriented to person, place, and time. Coordination normal.  Skin: Skin is warm and dry. No rash noted. He is not diaphoretic.  Psychiatric: He has a normal mood and affect.  His behavior is normal.  Nursing note and vitals reviewed.   Description   Change current warfarin dose - take 74m tablet, 1 tablets mon Sundays, Tuesdays, Thursdays and Saturdays.  Take 1.5 3 mg tablet all other days.  INR today is 1.2 (Goal 2-3) Return in about 1 weeks for Coumadin recheck        Assessment & Plan:   Problem List Items Addressed This Visit      Cardiovascular and Mediastinum   Hypertension   Relevant Orders   CMP14+EGFR   Paroxysmal atrial fibrillation (HCC) - Primary   Relevant Orders   CoaguChek XS/INR Waived     Respiratory   COPD (chronic obstructive pulmonary disease) (HCC)     Genitourinary   Stage III chronic kidney disease ([place]     Other   Tobacco abuse   Hyperlipidemia LDL goal <130   Pre-diabetes   Relevant Orders   Bayer DCA Hb A1c Waived   CMP14+EGFR       Follow up plan: Return in about 3 months (around 01/01/2018), or if symptoms worsen or fail to improve, for Recheck prediabetes.  Counseling provided for all of the vaccine components Orders Placed This Encounter  Procedures  . Bayer DCA Hb A1c Waived  . CNorth [place] MD [facility] 10/01/2017, 12:17 PM

## 2017-10-11 ENCOUNTER — Ambulatory Visit: Payer: Medicare HMO | Admitting: *Deleted

## 2017-10-11 DIAGNOSIS — I480 Paroxysmal atrial fibrillation: Secondary | ICD-10-CM

## 2017-10-11 LAB — COAGUCHEK XS/INR WAIVED
INR: 1.9 — ABNORMAL HIGH (ref 0.9–1.1)
Prothrombin Time: 22.3 s

## 2017-10-11 NOTE — Progress Notes (Signed)
Subjective:     Indication: atrial fibrillation Bleeding signs/symptoms: None Thromboembolic signs/symptoms: None  Missed Coumadin doses: None Medication changes: no Dietary changes: no Bacterial/viral infection: no Other concerns: no  The following portions of the patient's history were reviewed and updated as appropriate: allergies and current medications.  Review of Systems Pertinent items are noted in HPI.   Objective:    INR Today: 1.9  Current dose: 4.5mg  on Mon, Wed, Fri and 3 mg all other days  Assessment:    Subtherapeutic INR for goal of 2-3   Plan:    1. New dose: no change   2. Next INR: 2 weeks    [name], RN  I have reviewed the documentation above and have been available for consultation with Ms. Thurman Coyer.  I am in agreement with the plan of action.  Claretta Fraise MD

## 2017-10-16 ENCOUNTER — Other Ambulatory Visit: Payer: Self-pay | Admitting: Family Medicine

## 2017-10-23 ENCOUNTER — Ambulatory Visit: Payer: Medicare HMO | Admitting: *Deleted

## 2017-10-25 ENCOUNTER — Encounter: Payer: Medicare HMO | Admitting: *Deleted

## 2017-10-26 ENCOUNTER — Ambulatory Visit (INDEPENDENT_AMBULATORY_CARE_PROVIDER_SITE_OTHER): Payer: Medicare HMO | Admitting: Pharmacist Clinician (PhC)/ Clinical Pharmacy Specialist

## 2017-10-26 DIAGNOSIS — I480 Paroxysmal atrial fibrillation: Secondary | ICD-10-CM | POA: Diagnosis not present

## 2017-10-26 LAB — COAGUCHEK XS/INR WAIVED
INR: 1.9 — AB (ref 0.9–1.1)
PROTHROMBIN TIME: 22.5 s

## 2017-10-26 NOTE — Patient Instructions (Signed)
Description   Continue taking warfarin the same way as listed on calendar  INR today 1.9  (Goal is 2-3)

## 2017-11-05 ENCOUNTER — Other Ambulatory Visit: Payer: Self-pay | Admitting: Family Medicine

## 2017-11-07 NOTE — Telephone Encounter (Signed)
Last seen 10/01/17  Dr D  If approved route to nurse to call into [facility]

## 2017-11-07 NOTE — Telephone Encounter (Signed)
Will send in 1 month supply but patient should follow up with PCP for further fills, as this medication is high risk for his age.

## 2017-11-08 NOTE — Telephone Encounter (Signed)
Rx sent to pharmacy   

## 2017-11-19 ENCOUNTER — Other Ambulatory Visit: Payer: Self-pay | Admitting: Family Medicine

## 2017-11-26 ENCOUNTER — Other Ambulatory Visit: Payer: Self-pay | Admitting: Family Medicine

## 2017-12-04 ENCOUNTER — Other Ambulatory Visit: Payer: Self-pay | Admitting: Family Medicine

## 2017-12-05 NOTE — Telephone Encounter (Signed)
Phoned in.

## 2017-12-05 NOTE — Telephone Encounter (Signed)
Please phone in this because the system to send in electronically is not working.

## 2017-12-07 ENCOUNTER — Ambulatory Visit (INDEPENDENT_AMBULATORY_CARE_PROVIDER_SITE_OTHER): Payer: Medicare HMO | Admitting: Pharmacist Clinician (PhC)/ Clinical Pharmacy Specialist

## 2017-12-07 DIAGNOSIS — I480 Paroxysmal atrial fibrillation: Secondary | ICD-10-CM | POA: Diagnosis not present

## 2017-12-07 LAB — COAGUCHEK XS/INR WAIVED
INR: 2.1 — AB (ref 0.9–1.1)
PROTHROMBIN TIME: 25.5 s

## 2017-12-07 NOTE — Patient Instructions (Signed)
Description   Continue taking warfarin the same way as listed on calendar  INR today 2.1 (Goal is 2-3)

## 2017-12-16 IMAGING — DX DG ABDOMEN 1V
2 series · 2 of 2 positions shown · non-contrast
Comparison: 06/26/2007

CLINICAL DATA: Left lower quadrant pain

EXAM:
ABDOMEN - 1 VIEW

[abdomen kub (1 of 2)]
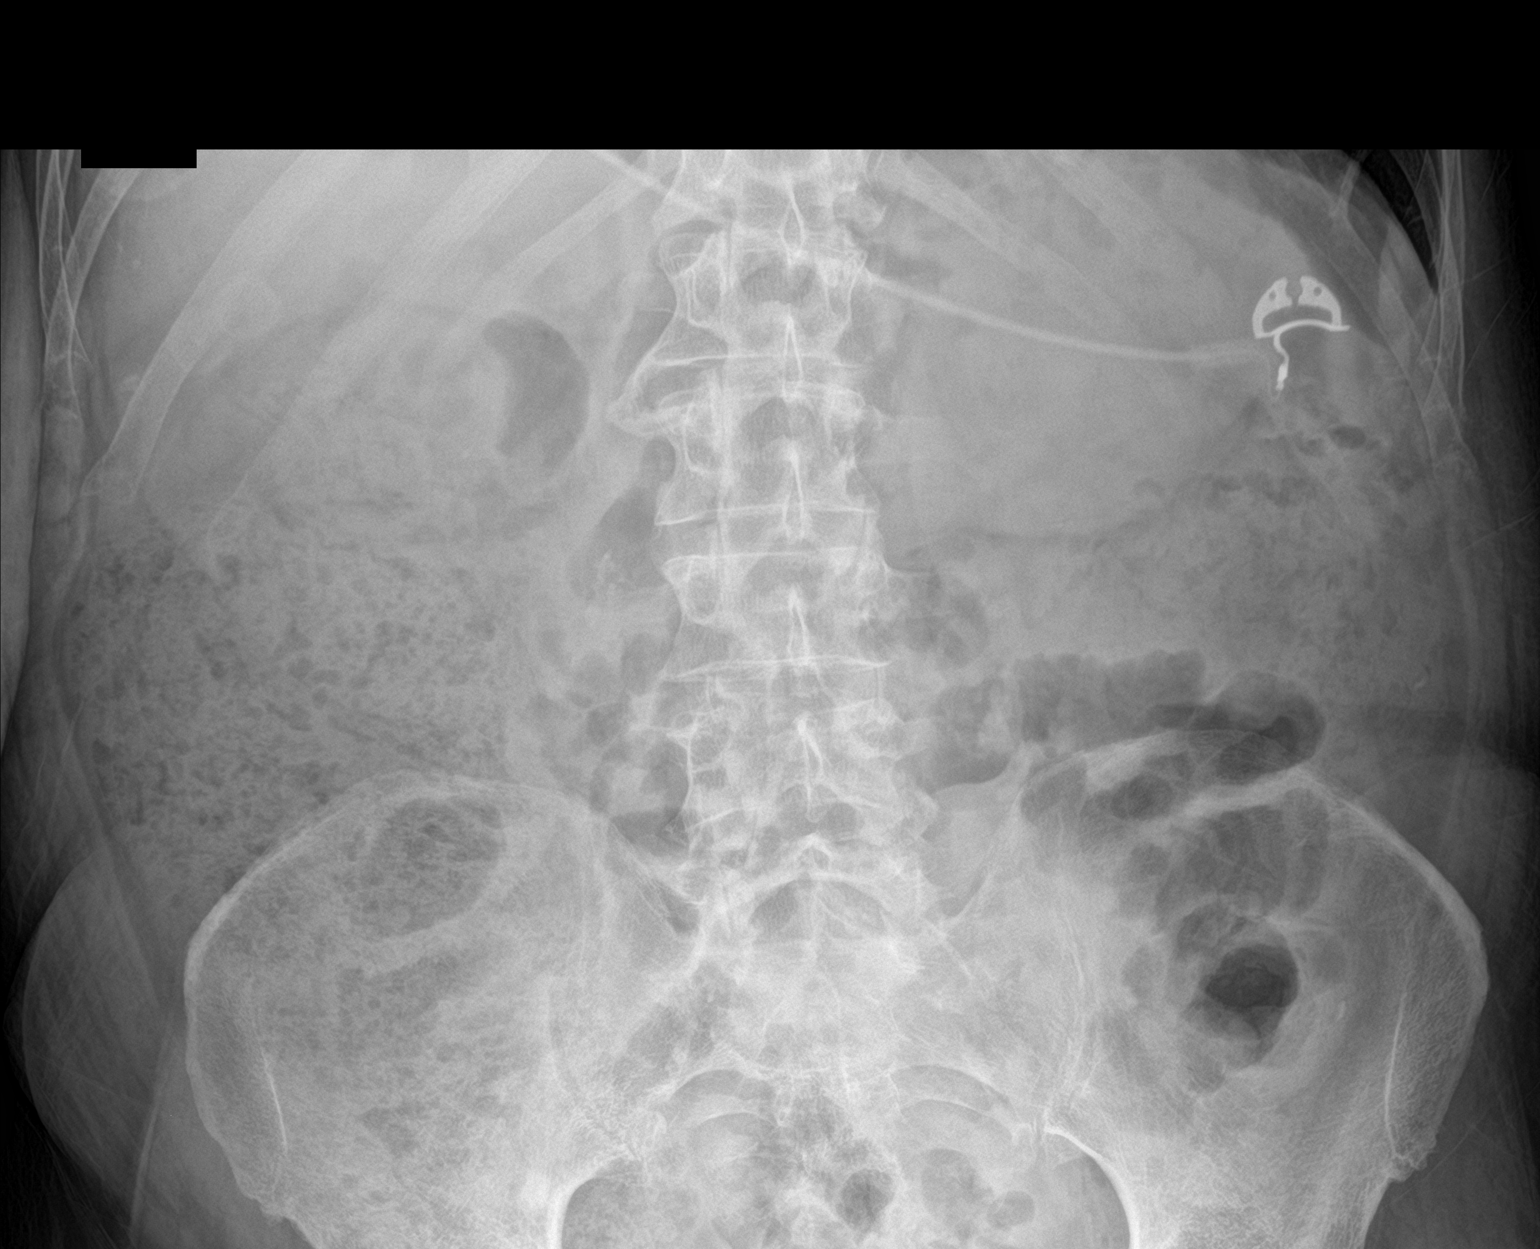

[abdomen kub (2 of 2)]
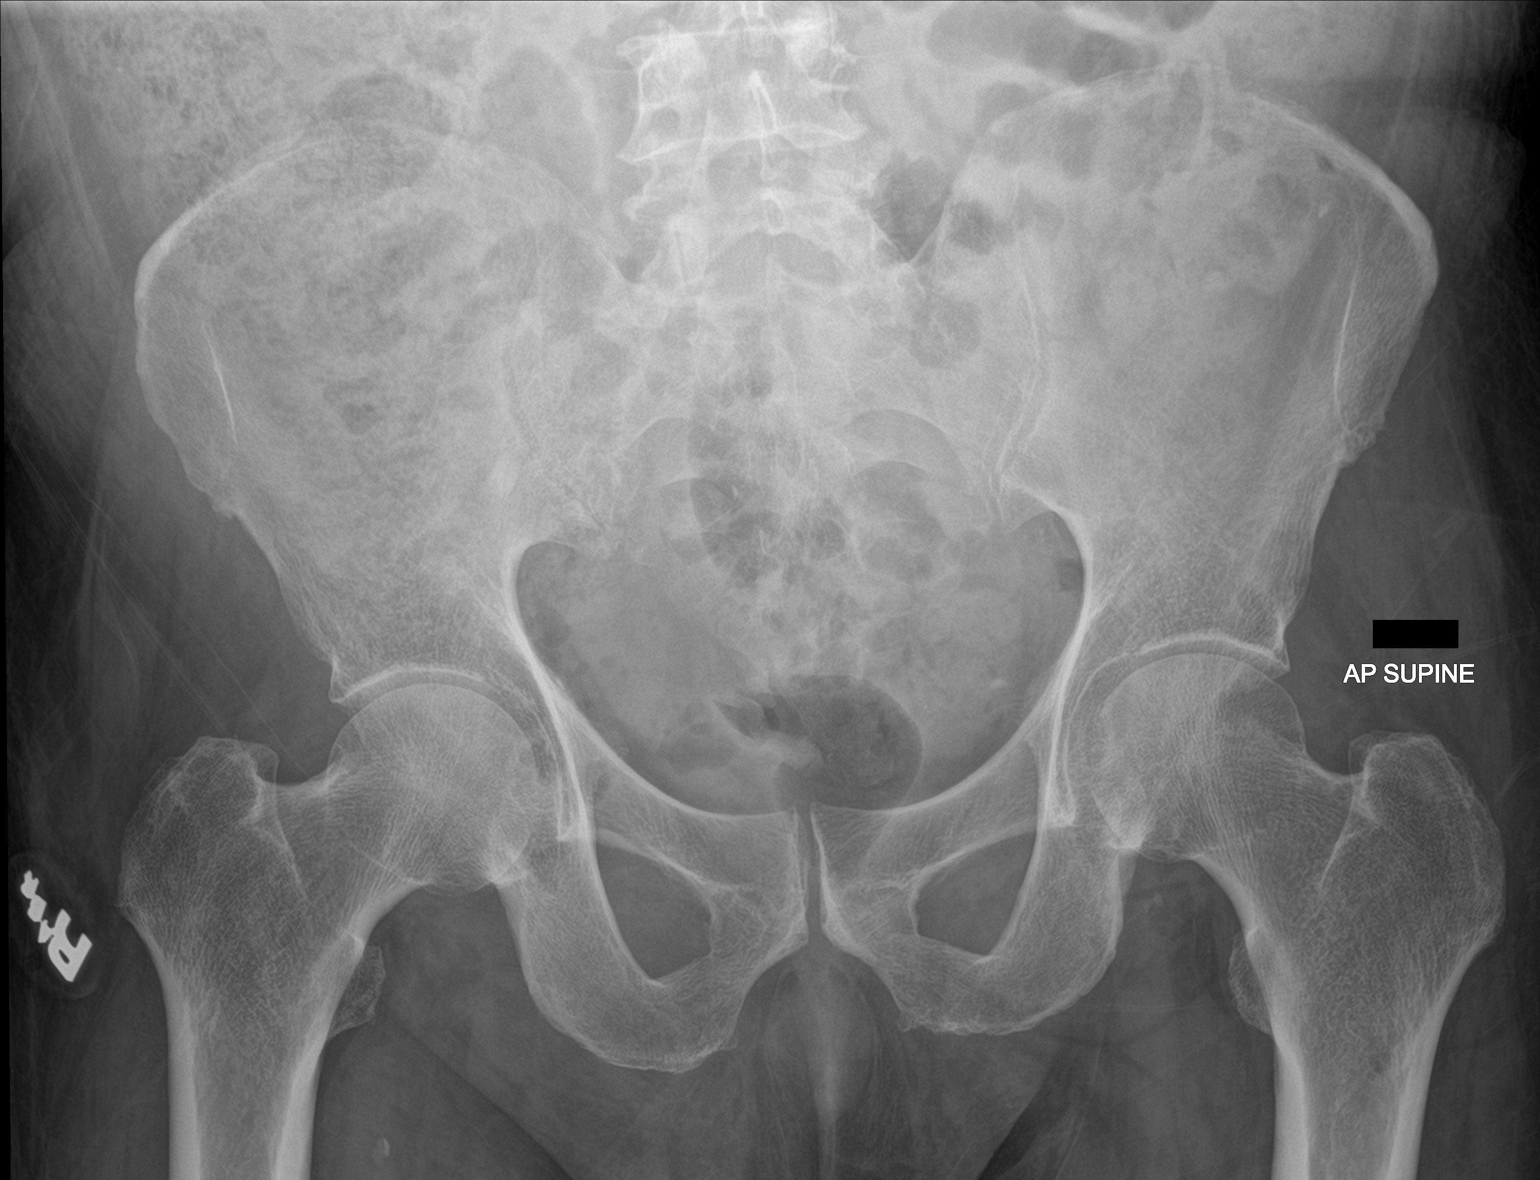

[2 of 2 positions shown; findings below may reference images not displayed]

FINDINGS: Nonobstructed bowel gas pattern with large amount of stool
throughout the colon. Vague opacities in the right lower quadrant
could represent pill fragments or ingested material.
IMPRESSION: Nonobstructed bowel gas pattern with large amount of stool in the
colon

## 2017-12-17 ENCOUNTER — Telehealth: Payer: Self-pay | Admitting: Family Medicine

## 2017-12-17 NOTE — Telephone Encounter (Signed)
Per pt's son pt is [facility] and just come here Son wants pt discouraged from scheduling here Will talk to front office manager

## 2017-12-19 ENCOUNTER — Other Ambulatory Visit: Payer: Self-pay | Admitting: *Deleted

## 2017-12-19 MED ORDER — FERREX 150 150 MG PO CAPS: 150.0000 mg | ORAL_CAPSULE | Freq: Every day | ORAL | 0 refills | Status: DC

## 2017-12-31 ENCOUNTER — Other Ambulatory Visit: Payer: Self-pay | Admitting: Family Medicine

## 2017-12-31 NOTE — Telephone Encounter (Signed)
Last seen 03/01/17  Dr D

## 2018-01-02 ENCOUNTER — Ambulatory Visit (INDEPENDENT_AMBULATORY_CARE_PROVIDER_SITE_OTHER): Payer: Medicare HMO | Admitting: Family Medicine

## 2018-01-02 ENCOUNTER — Encounter: Payer: Self-pay | Admitting: Family Medicine

## 2018-01-02 VITALS — BP 123/74 | HR 82 | Temp 97.0°F | Ht 69.0 in | Wt 160.0 lb

## 2018-01-02 DIAGNOSIS — Z23 Encounter for immunization: Secondary | ICD-10-CM

## 2018-01-02 DIAGNOSIS — J439 Emphysema, unspecified: Secondary | ICD-10-CM

## 2018-01-02 DIAGNOSIS — R7303 Prediabetes: Secondary | ICD-10-CM

## 2018-01-02 DIAGNOSIS — F339 Major depressive disorder, recurrent, unspecified: Secondary | ICD-10-CM | POA: Diagnosis not present

## 2018-01-02 DIAGNOSIS — E785 Hyperlipidemia, unspecified: Secondary | ICD-10-CM | POA: Diagnosis not present

## 2018-01-02 DIAGNOSIS — I10 Essential (primary) hypertension: Secondary | ICD-10-CM | POA: Diagnosis not present

## 2018-01-02 DIAGNOSIS — L98491 Non-pressure chronic ulcer of skin of other sites limited to breakdown of skin: Secondary | ICD-10-CM | POA: Diagnosis not present

## 2018-01-02 DIAGNOSIS — I480 Paroxysmal atrial fibrillation: Secondary | ICD-10-CM | POA: Diagnosis not present

## 2018-01-02 DIAGNOSIS — I4891 Unspecified atrial fibrillation: Secondary | ICD-10-CM | POA: Diagnosis not present

## 2018-01-02 LAB — COAGUCHEK XS/INR WAIVED
INR: 2.7 — AB (ref 0.9–1.1)
PROTHROMBIN TIME: 31.9 s

## 2018-01-02 MED ORDER — DULOXETINE HCL 60 MG PO CPEP: 120.0000 mg | ORAL_CAPSULE | Freq: Every day | ORAL | 1 refills | Status: DC

## 2018-01-02 NOTE — Patient Instructions (Signed)
Description   Continue taking warfarin the same way as listed on calendar  INR today 2.7(Goal is 2-3)

## 2018-01-02 NOTE — Progress Notes (Signed)
BP 123/74   Pulse 82   Temp (!) 97 F (36.1 C) (Oral)   Ht '5\' 9"'$  (1.753 m)   Wt 160 lb (72.6 kg)   BMI 23.63 kg/m    Subjective:    Patient ID: Don [name], male    DOB: 1941/09/23, 77 y.o.   MRN: [id number]  HPI: Don [name] is a 77 y.o. male presenting on 01/02/2018 for Hyperlipidemia (Patient is smoking again); Hypertension; and Anticoagulation   HPI Prediabetes Patient comes in today for recheck of his diabetes. Patient has been currently taking no medication currently but we are monitoring and he is trying diet and exercise.. Patient is not currently on an ACE inhibitor/ARB. Patient has not seen an ophthalmologist this year. Patient denies any issues with their feet.   Hypertension Patient is currently on bisoprolol and diltiazem, and their blood pressure today is 123/74. Patient denies any lightheadedness or dizziness. Patient denies headaches, blurred vision, chest pains, shortness of breath, or weakness. Denies any side effects from medication and is content with current medication.   COPD Patient is coming in for COPD recheck today.  He is currently on no medication but we are monitoring for now, he uses his albuterol when needed and has not needed as much recently.[name]  He has a mild chronic cough but denies any major coughing spells or wheezing spells.  He has 0nighttime symptoms per week and 1daytime symptoms per week currently.   Hyperlipidemia Patient is coming in for recheck of his hyperlipidemia. The patient is currently taking fish oil and Lipitor. They deny any issues with myalgias or history of liver damage from it. They deny any focal numbness or weakness or chest pain.   A. fib with CVR and recheck for Coumadin Patient comes in for recheck on his Coumadin due to his A. fib.  He denies any bleeding  Depression Patient is coming in for recheck of his depression and anxiety and says he is doing very well.  He currently takes Klonopin and Cymbalta.  He denies  any suicidal ideations or thoughts of hurting himself.  Wound on hand Patient has a wound on the dorsum of his left hand that had been biopsied before by dermatology and said it was noncancerous but he says is just not healing and he has been tried multiple different things on it and we have tried a few different things on it and it still does not heal.  Relevant past medical, surgical, family and social history reviewed and updated as indicated. Interim medical history since our last visit reviewed. Allergies and medications reviewed and updated.  Review of Systems  Constitutional: Negative for chills and fever.  Respiratory: Positive for cough. Negative for shortness of breath and wheezing.   Cardiovascular: Negative for chest pain, palpitations and leg swelling.  Musculoskeletal: Negative for back pain and gait problem.  Skin: Positive for wound. Negative for rash.  Neurological: Negative for dizziness, seizures, weakness, numbness and headaches.  All other systems reviewed and are negative.   Per HPI unless specifically indicated above   Allergies as of 01/02/2018      Reactions   Wellbutrin [bupropion] Anxiety      Medication List        Accurate as of 01/02/18 11:37 AM. Always use your most recent med list.          albuterol 108 (90 Base) MCG/ACT inhaler Commonly known as:  PROVENTIL HFA;VENTOLIN HFA Inhale 2 puffs into the lungs every 6 (six)  hours as needed for wheezing or shortness of breath.   ALLERGY RELIEF 10 MG tablet Generic drug:  loratadine TAKE 1 TABLET DAILY FOR ALLERGY   atorvastatin 20 MG tablet Commonly known as:  LIPITOR TAKE 1 TABLET DAILY   bisoprolol 10 MG tablet Commonly known as:  ZEBETA TAKE (1) TABLET TWICE A DAY.   clonazePAM 1 MG tablet Commonly known as:  KLONOPIN TAKE 1/2 TABLET ONCE DAILY AS NEEDED FOR ANXIETY   diltiazem 180 MG 24 hr capsule Commonly known as:  CARDIZEM CD Take 1 capsule (180 mg total) by mouth 2 (two) times  daily.   doxylamine (Sleep) 25 MG tablet Commonly known as:  UNISOM Take 25 mg by mouth at bedtime as needed.   DULoxetine 60 MG capsule Commonly known as:  CYMBALTA Take 2 capsules (120 mg total) by mouth daily.   FERREX 150 150 MG capsule Generic drug:  iron polysaccharides Take 1 capsule (150 mg total) by mouth daily.   multivitamin with minerals Tabs tablet Take 1 tablet by mouth daily.   pantoprazole 40 MG tablet Commonly known as:  PROTONIX TAKE (1) TABLET TWICE A DAY.   SUPER OMEGA-3 PO Take 2 capsules 2 (two) times daily by mouth.   terazosin 2 MG capsule Commonly known as:  HYTRIN TAKE 1 CAPSULE AT BEDTIME   tiotropium 18 MCG inhalation capsule Commonly known as:  SPIRIVA Place 1 capsule (18 mcg total) into inhaler and inhale daily.   warfarin 3 MG tablet Commonly known as:  COUMADIN Take as directed by the anticoagulation clinic. If you are unsure how to take this medication, talk to your nurse or doctor. Original instructions:  TAKE 1 TABLET DAILY OR AS DIRECTED          Objective:    BP 123/74   Pulse 82   Temp (!) 97 F (36.1 C) (Oral)   Ht '5\' 9"'$  (1.753 m)   Wt 160 lb (72.6 kg)   BMI 23.63 kg/m   Wt Readings from Last 3 Encounters:  01/02/18 160 lb (72.6 kg)  10/01/17 159 lb (72.1 kg)  08/14/17 163 lb (73.9 kg)    Physical Exam  Constitutional: He is oriented to person, place, and time. He appears well-developed and well-nourished. No distress.  Eyes: Conjunctivae are normal. No scleral icterus.  Cardiovascular: Normal rate, normal heart sounds and intact distal pulses. An irregularly irregular rhythm present.  No murmur heard. Pulmonary/Chest: Effort normal and breath sounds normal. No respiratory distress. He has no wheezes.  Musculoskeletal: Normal range of motion. He exhibits no edema.  Neurological: He is alert and oriented to person, place, and time. Coordination normal.  Skin: Skin is warm and dry. Lesion (Ulceration that is about  2.5 cm in diameter on posterior left hand with beefy pink base, no erythema or drainage or warmth noted) noted. No rash noted. He is not diaphoretic.  Psychiatric: His behavior is normal. Judgment normal. His mood appears anxious. He exhibits a depressed mood. He expresses no suicidal ideation. He expresses no suicidal plans.  Nursing note and vitals reviewed.   Description   Continue taking warfarin the same way as listed on calendar  INR today 2.7(Goal is 2-3)        Assessment & Plan:   Problem List Items Addressed This Visit      Cardiovascular and Mediastinum   Hypertension   Relevant Orders   CMP14+EGFR   RESOLVED: Atrial fibrillation with RVR ([place]) - Primary     Respiratory  COPD (chronic obstructive pulmonary disease) (HCC)     Other   Hyperlipidemia LDL goal <130   Relevant Orders   Lipid panel   Pre-diabetes   Relevant Orders   Bayer DCA Hb A1c Waived   CMP14+EGFR   Depression, recurrent ([place])    Patient has been having a lot more issues with his wife in the nursing facility, will increase the Cymbalta and see how it does for him.      Relevant Medications   DULoxetine (CYMBALTA) 60 MG capsule    Other Visit Diagnoses    Nonhealing skin ulcer, limited to breakdown of skin ([place])       On left posterior hand, had biopsied in the past but will send back to dermatology   Relevant Orders   Ambulatory referral to Dermatology   Encounter for immunization       Relevant Orders   Flu vaccine HIGH DOSE PF (Completed)       Follow up plan: Return in about 3 months (around 04/01/2018), or if symptoms worsen or fail to improve, for Recheck prediabetes and hypertension, patient also needs a 4-week INR recheck.  Counseling provided for all of the vaccine components Orders Placed This Encounter  Procedures  . CoaguChek XS/INR Waived  . Bayer DCA Hb A1c Waived  . CBC with Differential/Platelet  . Lipid panel  . Ponshewaing Dettinger, MD [facility] 01/02/2018, 11:37 AM

## 2018-01-02 NOTE — Assessment & Plan Note (Signed)
Patient has been having a lot more issues with his wife in the nursing facility, will increase the Cymbalta and see how it does for him.

## 2018-01-09 ENCOUNTER — Other Ambulatory Visit: Payer: Self-pay | Admitting: Family Medicine

## 2018-01-10 DIAGNOSIS — Z85828 Personal history of other malignant neoplasm of skin: Secondary | ICD-10-CM | POA: Diagnosis not present

## 2018-01-10 DIAGNOSIS — L570 Actinic keratosis: Secondary | ICD-10-CM | POA: Diagnosis not present

## 2018-01-10 DIAGNOSIS — D485 Neoplasm of uncertain behavior of skin: Secondary | ICD-10-CM | POA: Diagnosis not present

## 2018-01-10 DIAGNOSIS — C44629 Squamous cell carcinoma of skin of left upper limb, including shoulder: Secondary | ICD-10-CM | POA: Diagnosis not present

## 2018-01-10 DIAGNOSIS — L0100 Impetigo, unspecified: Secondary | ICD-10-CM | POA: Diagnosis not present

## 2018-01-22 ENCOUNTER — Other Ambulatory Visit: Payer: Self-pay | Admitting: Family Medicine

## 2018-01-23 ENCOUNTER — Other Ambulatory Visit: Payer: Self-pay | Admitting: Family Medicine

## 2018-01-24 DIAGNOSIS — C44629 Squamous cell carcinoma of skin of left upper limb, including shoulder: Secondary | ICD-10-CM | POA: Diagnosis not present

## 2018-02-01 ENCOUNTER — Ambulatory Visit: Payer: Medicare HMO | Admitting: Pharmacist Clinician (PhC)/ Clinical Pharmacy Specialist

## 2018-02-01 DIAGNOSIS — I480 Paroxysmal atrial fibrillation: Secondary | ICD-10-CM

## 2018-02-01 LAB — HEMOGLOBIN, FINGERSTICK: HEMOGLOBIN: 11.5 g/dL — AB (ref 12.6–17.7)

## 2018-02-01 LAB — COAGUCHEK XS/INR WAIVED
INR: 5.2 — AB (ref 0.9–1.1)
PROTHROMBIN TIME: 62.1 s

## 2018-02-01 NOTE — Progress Notes (Unsigned)
Subjective:     Indication: atrial fibrillation Bleeding signs/symptoms: None Thromboembolic signs/symptoms: None  Missed Coumadin doses: None Medication changes: no Dietary changes: no Bacterial/viral infection: no Other concerns: no  The following portions of the patient's history were reviewed and updated as appropriate: allergies and current medications.  Review of Systems Pertinent items are noted in HPI.   Objective:    INR Today: 5.4 and 5.2 Current dose: 4.5mg  on Mon, Wed, Fri and 3 mg all other days  Assessment:    Supratherapeutic INR for goal of 2-3   Plan:    1. New dose: hold for 3 days and then rck   2. Next INR: 3 days  Instructions per Memory Argue, Pharm-D

## 2018-02-04 ENCOUNTER — Ambulatory Visit: Payer: Medicare HMO | Admitting: Family Medicine

## 2018-02-04 ENCOUNTER — Other Ambulatory Visit: Payer: Self-pay | Admitting: Family Medicine

## 2018-02-04 ENCOUNTER — Ambulatory Visit (INDEPENDENT_AMBULATORY_CARE_PROVIDER_SITE_OTHER): Payer: Medicare HMO

## 2018-02-04 ENCOUNTER — Encounter: Payer: Self-pay | Admitting: Family Medicine

## 2018-02-04 ENCOUNTER — Ambulatory Visit (HOSPITAL_COMMUNITY)
Admission: RE | Admit: 2018-02-04 | Discharge: 2018-02-04 | Disposition: A | Discharge status: Home / Self Care | Payer: Medicare HMO | Source: Ambulatory Visit | Attending: Family Medicine | Admitting: Family Medicine

## 2018-02-04 ENCOUNTER — Ambulatory Visit (INDEPENDENT_AMBULATORY_CARE_PROVIDER_SITE_OTHER): Payer: Medicare HMO | Admitting: Family Medicine

## 2018-02-04 ENCOUNTER — Telehealth: Payer: Self-pay | Admitting: *Deleted

## 2018-02-04 VITALS — BP 125/81 | HR 74 | Temp 97.7°F | Ht 69.0 in | Wt 160.0 lb

## 2018-02-04 DIAGNOSIS — R0602 Shortness of breath: Secondary | ICD-10-CM

## 2018-02-04 DIAGNOSIS — I480 Paroxysmal atrial fibrillation: Secondary | ICD-10-CM | POA: Diagnosis not present

## 2018-02-04 DIAGNOSIS — R590 Localized enlarged lymph nodes: Secondary | ICD-10-CM | POA: Insufficient documentation

## 2018-02-04 DIAGNOSIS — R0989 Other specified symptoms and signs involving the circulatory and respiratory systems: Secondary | ICD-10-CM

## 2018-02-04 DIAGNOSIS — R911 Solitary pulmonary nodule: Secondary | ICD-10-CM

## 2018-02-04 DIAGNOSIS — J449 Chronic obstructive pulmonary disease, unspecified: Secondary | ICD-10-CM | POA: Diagnosis not present

## 2018-02-04 DIAGNOSIS — J439 Emphysema, unspecified: Secondary | ICD-10-CM | POA: Insufficient documentation

## 2018-02-04 DIAGNOSIS — R918 Other nonspecific abnormal finding of lung field: Secondary | ICD-10-CM | POA: Diagnosis not present

## 2018-02-04 DIAGNOSIS — I700 Atherosclerosis of aorta: Secondary | ICD-10-CM | POA: Diagnosis not present

## 2018-02-04 DIAGNOSIS — Z716 Tobacco abuse counseling: Secondary | ICD-10-CM | POA: Diagnosis not present

## 2018-02-04 DIAGNOSIS — I2510 Atherosclerotic heart disease of native coronary artery without angina pectoris: Secondary | ICD-10-CM | POA: Insufficient documentation

## 2018-02-04 DIAGNOSIS — J40 Bronchitis, not specified as acute or chronic: Secondary | ICD-10-CM | POA: Insufficient documentation

## 2018-02-04 LAB — POCT I-STAT CREATININE: Creatinine, Ser: 3.6 mg/dL — ABNORMAL HIGH (ref 0.61–1.24)

## 2018-02-04 LAB — COAGUCHEK XS/INR WAIVED
INR: 2.5 — ABNORMAL HIGH (ref 0.9–1.1)
Prothrombin Time: 29.5 s

## 2018-02-04 MED ORDER — AMOXICILLIN-POT CLAVULANATE 875-125 MG PO TABS: 1.0000 | ORAL_TABLET | Freq: Two times a day (BID) | ORAL | 0 refills | Status: DC

## 2018-02-04 MED ORDER — PREDNISONE 20 MG PO TABS: ORAL_TABLET | ORAL | 0 refills | Status: DC

## 2018-02-04 MED ORDER — IOPAMIDOL (ISOVUE-300) INJECTION 61%: 75.0000 mL | Freq: Once | INTRAVENOUS | Status: DC | PRN

## 2018-02-04 NOTE — Progress Notes (Signed)
BP 125/81   Pulse 74   Temp 97.7 F (36.5 C) (Oral)   Ht 5\' 9"  (1.753 m)   Wt 160 lb (72.6 kg)   BMI 23.63 kg/m    Subjective:    Patient ID: Don [name], male    DOB: 1941-02-21, 77 y.o.   MRN: [id number]  HPI: Don [name] is a 77 y.o. male presenting on 02/04/2018 for Anticoagulation (4.5 mg on Monday, Wedsnesday, Friday; 3 mg other days); Sinusitis (sinus drainage, coughing up mucus); and Frequent choking while eating   HPI Patient has been having frequent coughing and chest congestion and wheezing and feeling somewhat short of breath.  He is also been having sinus drainage and coughing up mucus and feels like he gets choked and cannot breathe from all the mucus and phlegm that is been coughing up.  Patient does still admit that he is a smoker and he does say that he is finally trying to quit smoking.  He is using his inhalers and they have been helping some.  He denies any fevers or chills.  He says it has been getting worse over the past couple weeks.  Coumadin recheck Patient is coming in for Coumadin recheck today because of paroxysmal A. fib.  His Coumadin was 5.23 days ago and is 2.5 here today.  He denies any bleeding anywhere.  Relevant past medical, surgical, family and social history reviewed and updated as indicated. Interim medical history since our last visit reviewed. Allergies and medications reviewed and updated.  Review of Systems  Constitutional: Negative for chills and fever.  HENT: Positive for congestion, postnasal drip, rhinorrhea, sinus pressure, sneezing and sore throat. Negative for ear discharge, ear pain and voice change.   Eyes: Negative for pain, discharge, redness and visual disturbance.  Respiratory: Positive for cough and wheezing. Negative for shortness of breath.   Cardiovascular: Negative for chest pain and leg swelling.  Musculoskeletal: Negative for gait problem.  Skin: Negative for rash.  All other systems reviewed and are  negative.   Per HPI unless specifically indicated above   Allergies as of 02/04/2018      Reactions   Wellbutrin [bupropion] Anxiety      Medication List        Accurate as of 02/04/18  2:41 PM. Always use your most recent med list.          albuterol 108 (90 Base) MCG/ACT inhaler Commonly known as:  PROVENTIL HFA;VENTOLIN HFA Inhale 2 puffs into the lungs every 6 (six) hours as needed for wheezing or shortness of breath.   atorvastatin 20 MG tablet Commonly known as:  LIPITOR TAKE 1 TABLET DAILY   bisoprolol 10 MG tablet Commonly known as:  ZEBETA TAKE (1) TABLET TWICE A DAY.   clonazePAM 1 MG tablet Commonly known as:  KLONOPIN TAKE 1/2 TABLET ONCE DAILY AS NEEDED FOR ANXIETY   diltiazem 180 MG 24 hr capsule Commonly known as:  CARDIZEM CD Take 1 capsule (180 mg total) by mouth 2 (two) times daily.   DULoxetine 60 MG capsule Commonly known as:  CYMBALTA Take 2 capsules (120 mg total) by mouth daily.   FERREX 150 150 MG capsule Generic drug:  iron polysaccharides TAKE (1) CAPSULE DAILY   loratadine 10 MG tablet Commonly known as:  CLARITIN Take 10 mg by mouth daily.   multivitamin with minerals Tabs tablet Take 1 tablet by mouth daily.   pantoprazole 40 MG tablet Commonly known as:  PROTONIX TAKE (1) TABLET  TWICE A DAY.   SUPER OMEGA-3 PO Take 2 capsules 2 (two) times daily by mouth.   terazosin 2 MG capsule Commonly known as:  HYTRIN TAKE 1 CAPSULE AT BEDTIME   tiotropium 18 MCG inhalation capsule Commonly known as:  SPIRIVA Place 1 capsule (18 mcg total) into inhaler and inhale daily.   warfarin 3 MG tablet Commonly known as:  COUMADIN Take as directed by the anticoagulation clinic. If you are unsure how to take this medication, talk to your nurse or doctor. Original instructions:  TAKE 1 TABLET DAILY OR AS DIRECTED          Objective:    BP 125/81   Pulse 74   Temp 97.7 F (36.5 C) (Oral)   Ht 5\' 9"  (1.753 m)   Wt 160 lb (72.6 kg)    BMI 23.63 kg/m   Wt Readings from Last 3 Encounters:  02/04/18 160 lb (72.6 kg)  01/02/18 160 lb (72.6 kg)  10/01/17 159 lb (72.1 kg)    Physical Exam  Constitutional: He is oriented to person, place, and time. He appears well-developed and well-nourished. No distress.  Eyes: Conjunctivae are normal. No scleral icterus.  Neck: Neck supple. No thyromegaly present.  Cardiovascular: Normal rate, regular rhythm, normal heart sounds and intact distal pulses.  No murmur heard. Pulmonary/Chest: Effort normal and breath sounds normal. No respiratory distress. He has no wheezes.  Musculoskeletal: Normal range of motion. He exhibits no edema.  Lymphadenopathy:    He has no cervical adenopathy.  Neurological: He is alert and oriented to person, place, and time. Coordination normal.  Skin: Skin is warm and dry. No rash noted. He is not diaphoretic.  Psychiatric: He has a normal mood and affect. His behavior is normal.  Nursing note and vitals reviewed.   Description   INR is good today, will lower dose from where he previously was because he was high this last time and also because we are giving him an antibiotic and steroids  INR today is 2.5     Chest x-ray: Increased size of pulmonary nodules along with congestion, will send for CT.    Assessment & Plan:   Problem List Items Addressed This Visit      Cardiovascular and Mediastinum   Paroxysmal atrial fibrillation (HCC)   Relevant Orders   CoaguChek XS/INR Waived    Other Visit Diagnoses    Chest congestion    -  Primary   Relevant Medications   predniSONE (DELTASONE) 20 MG tablet   amoxicillin-clavulanate (AUGMENTIN) 875-125 MG tablet   Other Relevant Orders   DG Chest 2 View (Completed)   Tobacco abuse counseling       Pulmonary nodule       Shortness of breath           Follow up plan: Return if symptoms worsen or fail to improve.  Counseling provided for all of the vaccine components Orders Placed This Encounter   Procedures  . DG Chest 2 View  . CoaguChek XS/INR [place], MD [facility] 02/04/2018, 2:41 PM

## 2018-02-04 NOTE — Telephone Encounter (Signed)
Don [name] called stating that patient's Creatinine is to elevated to do CT with contrast.  Ordered CT without. Per Dr. Warrick Parisian ok to do without Contrast

## 2018-02-04 NOTE — Progress Notes (Signed)
ISTAT CREAT 3.6 notified XOVANV nurse at Dr. Merita Norton office

## 2018-02-04 NOTE — Patient Instructions (Signed)
Description   INR is good today, will lower dose from where he previously was because he was high this last time and also because we are giving him an antibiotic and steroids  INR today is 2.5

## 2018-02-05 ENCOUNTER — Other Ambulatory Visit: Payer: Self-pay | Admitting: Family

## 2018-02-05 DIAGNOSIS — R918 Other nonspecific abnormal finding of lung field: Secondary | ICD-10-CM

## 2018-02-06 ENCOUNTER — Other Ambulatory Visit: Payer: Self-pay | Admitting: Family Medicine

## 2018-02-11 ENCOUNTER — Ambulatory Visit (HOSPITAL_COMMUNITY): Payer: Medicare HMO | Admitting: Hematology

## 2018-02-12 ENCOUNTER — Encounter (HOSPITAL_COMMUNITY): Payer: Self-pay | Admitting: Hematology

## 2018-02-12 ENCOUNTER — Other Ambulatory Visit: Payer: Self-pay | Admitting: *Deleted

## 2018-02-12 ENCOUNTER — Other Ambulatory Visit: Payer: Self-pay

## 2018-02-12 ENCOUNTER — Inpatient Hospital Stay (HOSPITAL_COMMUNITY): Payer: Medicare HMO | Attending: Hematology | Admitting: Hematology

## 2018-02-12 VITALS — BP 133/75 | HR 104 | Temp 98.1°F | Resp 18 | Wt 166.0 lb

## 2018-02-12 DIAGNOSIS — Z8546 Personal history of malignant neoplasm of prostate: Secondary | ICD-10-CM | POA: Diagnosis not present

## 2018-02-12 DIAGNOSIS — I4891 Unspecified atrial fibrillation: Secondary | ICD-10-CM | POA: Insufficient documentation

## 2018-02-12 DIAGNOSIS — Z7901 Long term (current) use of anticoagulants: Secondary | ICD-10-CM | POA: Diagnosis not present

## 2018-02-12 DIAGNOSIS — N183 Chronic kidney disease, stage 3 (moderate): Secondary | ICD-10-CM | POA: Diagnosis not present

## 2018-02-12 DIAGNOSIS — F17210 Nicotine dependence, cigarettes, uncomplicated: Secondary | ICD-10-CM | POA: Diagnosis not present

## 2018-02-12 DIAGNOSIS — R918 Other nonspecific abnormal finding of lung field: Secondary | ICD-10-CM | POA: Diagnosis not present

## 2018-02-12 DIAGNOSIS — R51 Headache: Secondary | ICD-10-CM | POA: Insufficient documentation

## 2018-02-12 DIAGNOSIS — R42 Dizziness and giddiness: Secondary | ICD-10-CM | POA: Insufficient documentation

## 2018-02-12 DIAGNOSIS — Z923 Personal history of irradiation: Secondary | ICD-10-CM | POA: Insufficient documentation

## 2018-02-12 DIAGNOSIS — Z9079 Acquired absence of other genital organ(s): Secondary | ICD-10-CM | POA: Insufficient documentation

## 2018-02-12 MED ORDER — CALCITRIOL 0.25 MCG PO CAPS: 0.2500 ug | ORAL_CAPSULE | Freq: Every day | ORAL | 2 refills | Status: DC

## 2018-02-12 NOTE — Telephone Encounter (Signed)
Fax received [facility] Rx for Calcitriol 0.25 mg 1 cap daily Not on med list Please advise

## 2018-02-12 NOTE — Assessment & Plan Note (Signed)
1.  Left lower lobe lung mass: He has more than 50 pack year smoking history.  CT scan on 02/04/2018 shows left lower lobe lung mass with mediastinal adenopathy.  I discussed the findings with the patient and his step-son.  CT scan was limited for evaluation without contrast given his chronic kidney disease.  I will obtain a PET CT scan for further staging.  I will also obtain MRI of the brain.  PET/CT scan will allow [place] to pick the site for biopsy.  He is on Coumadin for his paroxysmal atrial fibrillation.  We will have to discontinue Coumadin prior to biopsy.  He also reports lower back pain for the last 2-3 days.  He thinks it could be coming from him working outside his home.  2.  Prostate cancer: He reportedly underwent prostatectomy in 2003.  He also had radiation therapy in 2004.

## 2018-02-12 NOTE — Telephone Encounter (Signed)
Go ahead and send him a refill for this medication and give him 3 months worth

## 2018-02-12 NOTE — Progress Notes (Signed)
[facility] NOTE  Patient Care Team: Dettinger, Fransisca Kaufmann, MD as PCP - General (Family Medicine) Fran Lowes, MD as Consulting Physician (Nephrology) Sherlynn Stalls, MD as Consulting Physician (Ophthalmology) Melina Schools, OD as Consulting Physician (Optometry) Gatha Mayer, MD as Consulting Physician (Gastroenterology)  CHIEF COMPLAINTS/PURPOSE OF CONSULTATION:  Newly diagnosed left lower lobe lung mass.  HISTORY OF PRESENTING ILLNESS:  Don [name] 77 y.o. male is seen in consultation today for further workup and management of left lower lobe lung mass.  He is a more than 50-pack-year current active smoker.  CT scan on 02/04/2018 showed left lower lobe lung mass with mediastinal adenopathy.  Patient does not report any significant weight loss.  He developed lower back pain for the last 2-3 days.  He thinks it is coming from his working outside the house.  He does feel occasional dizziness.  He has occasional headaches which are more numbing type.  He denies any vision changes.  No nausea or vomiting was reported.  He lives by himself and is independent of ADLs and IADLs.  His wife is in a nursing home.  MEDICAL HISTORY:  Past Medical History:  Diagnosis Date  . Acute bronchitis 04/03/2014  . Anxiety   . Atrial fibrillation ([place])   . Cataract   . COPD (chronic obstructive pulmonary disease) ([place])   . Essential hypertension   . Hyperlipidemia   . Macular degeneration   . Noncompliance with medications 04/2013   Xarelto, digoxin previously  . Pre-diabetes   . Prostate cancer ([place])   . Stage III chronic kidney disease 04/03/2014  . Tubular adenoma of colon 07/31/02, 11/18/03    SURGICAL HISTORY: Past Surgical History:  Procedure Laterality Date  . Anal abcess,Hemorroids,    . COLONOSCOPY N/A 04/05/2014   Procedure: COLONOSCOPY;  Surgeon: Ladene Artist, MD;  Location: [facility] END[facility]COPY;  Service: Endoscopy;  Laterality: N/A;  . COLONOSCOPY W/ BIOPSIES   11/18/2003   Dr. Earle Gell  . ESOPHAGOGASTRODUODENOSCOPY  11/18/2003   Dr. Earle Gell  . ESOPHAGOGASTRODUODENOSCOPY N/A 04/05/2014   Procedure: ESOPHAGOGASTRODUODENOSCOPY (EGD);  Surgeon: Ladene Artist, MD;  Location: [facility];  Service: Endoscopy;  Laterality: N/A;  . ESOPHAGOGASTRODUODENOSCOPY N/A 02/02/2017   Procedure: ESOPHAGOGASTRODUODENOSCOPY (EGD);  Surgeon: Danie Binder, MD;  Location: [facility];  Service: Endoscopy;  Laterality: N/A;  . RETROPUBIC PROSTATECTOMY  11/26/2001  . TONSILLECTOMY      SOCIAL HISTORY: Social History   Socioeconomic History  . Marital status: Married    Spouse name: Not on file  . Number of children: 0  . Years of education: Not on file  . Highest education level: Not on file  Occupational History  . Occupation: retired  Scientific laboratory technician  . Financial resource strain: Not on file  . Food insecurity:    Worry: Not on file    Inability: Not on file  . Transportation needs:    Medical: Not on file    Non-medical: Not on file  Tobacco Use  . Smoking status: Current Some Day Smoker    Packs/day: 0.50    Years: 62.00    Pack years: 31.00    Types: Cigarettes, Cigars    Last attempt to quit: 12/28/2016    Years since quitting: 1.1  . Smokeless tobacco: Never Used  Substance and Sexual Activity  . Alcohol use: No  . Drug use: No  . Sexual activity: Never  Lifestyle  . Physical activity:    Days per  week: Not on file    Minutes per session: Not on file  . Stress: Not on file  Relationships  . Social connections:    Talks on phone: Not on file    Gets together: Not on file    Attends religious service: Not on file    Active member of club or organization: Not on file    Attends meetings of clubs or organizations: Not on file    Relationship status: Not on file  . Intimate partner violence:    Fear of current or ex partner: Not on file    Emotionally abused: Not on file    Physically abused: Not on file    Forced sexual  activity: Not on file  Other Topics Concern  . 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 Married to his second wife, she is in a nursing home.    FAMILY HISTORY: Family History  Problem Relation Age of Onset  . Diabetes Father   . Heart disease Father        MI  . Heart attack Father   . Heart attack Mother   . Heart disease Brother   . Cancer Brother        lung  . Lung cancer Brother   . Heart disease Brother   . Lung cancer Sister   . Cancer Sister        lung  . Heart disease Brother   . Heart disease Brother     ALLERGIES:  is allergic to wellbutrin [bupropion].  MEDICATIONS:  Current Outpatient Medications  Medication Sig Dispense Refill  . albuterol (PROVENTIL HFA;VENTOLIN HFA) 108 (90 Base) MCG/ACT inhaler Inhale 2 puffs into the lungs every 6 (six) hours as needed for wheezing or shortness of breath. 1 Inhaler 0  . amoxicillin-clavulanate (AUGMENTIN) 875-125 MG tablet Take 1 tablet by mouth 2 (two) times daily. 20 tablet 0  . atorvastatin (LIPITOR) 20 MG tablet TAKE 1 TABLET DAILY 30 tablet 5  . bisoprolol (ZEBETA) 10 MG tablet TAKE (1) TABLET TWICE A DAY. 180 tablet 0  . clonazePAM (KLONOPIN) 1 MG tablet TAKE 1/2 TABLET ONCE DAILY AS NEEDED FOR ANXIETY 15 tablet 1  . diltiazem (CARDIZEM CD) 180 MG 24 hr capsule Take 1 capsule (180 mg total) by mouth 2 (two) times daily. 60 capsule 4  . diltiazem (CARDIZEM CD) 180 MG 24 hr capsule Take 1 capsule (180 mg total) by mouth 2 (two) times daily. 60 capsule 2  . DULoxetine (CYMBALTA) 60 MG capsule Take 2 capsules (120 mg total) by mouth daily. 180 capsule 1  . FERREX 150 150 MG capsule TAKE (1) CAPSULE DAILY 30 capsule 5  . loratadine (CLARITIN) 10 MG tablet Take 10 mg by mouth daily.    . Multiple Vitamin (MULTIVITAMIN WITH MINERALS) TABS Take 1 tablet by mouth daily.    . Omega-3 Fatty Acids (SUPER OMEGA-3 PO) Take 2 capsules 2 (two) times daily by mouth.     . pantoprazole (PROTONIX) 40 MG tablet TAKE (1) TABLET TWICE A DAY. 180 tablet 0  . predniSONE (DELTASONE) 20 MG tablet 2 po at same time daily for 5 days 10 tablet 0  . terazosin (HYTRIN) 2 MG capsule TAKE 1 CAPSULE AT BEDTIME 90 capsule 0  . tiotropium (SPIRIVA) 18 MCG inhalation capsule Place 1 capsule (18 mcg total) into inhaler and inhale daily. 90 capsule 2  . triamcinolone cream (KENALOG) 0.1 %     .  warfarin (COUMADIN) 3 MG tablet TAKE 1 TABLET DAILY OR AS DIRECTED 90 tablet 0   No current facility-administered medications for this visit.     REVIEW OF SYSTEMS:   Constitutional: Denies fevers, chills or abnormal night sweats Eyes: Denies blurriness of vision, double vision or watery eyes Ears, nose, mouth, throat, and face: Denies mucositis or sore throat Respiratory: Denies cough, dyspnea or wheezes Cardiovascular: Denies palpitation, chest discomfort or lower extremity swelling Gastrointestinal:  Denies nausea, heartburn or change in bowel habits. he reports some trouble swallowing liquids and solids. Skin: Denies abnormal skin rashes Lymphatics: Denies new lymphadenopathy or easy bruising Neurological:Denies numbness, tingling or new weaknesses.  He reports low back pain which is new. Behavioral/Psych: Mood is stable, no new changes  All other systems were reviewed with the patient and are negative.  PHYSICAL EXAMINATION: ECOG PERFORMANCE STATUS: 2 - Symptomatic, <50% confined to bed  Vitals:   02/12/18 1015  BP: 133/75  Pulse: (!) 104  Resp: 18  Temp: 98.1 F (36.7 C)  SpO2: 97%   Filed Weights   02/12/18 1015  Weight: 166 lb (75.3 kg)    GENERAL:alert, no distress and comfortable SKIN: skin color, texture, turgor are normal, no rashes or significant lesions EYES: normal, conjunctiva are pink and non-injected, sclera clear OROPHARYNX:no exudate, no erythema and lips, buccal mucosa, and tongue normal  NECK: supple, thyroid normal size, non-tender, without  nodularity LYMPH:  no palpable lymphadenopathy in the cervical, axillary or inguinal LUNGS: Bilateral breath sounds present with occasional wheezing. HEART: regular rate & rhythm and no murmurs  ABDOMEN:abdomen soft, non-tender and normal bowel sounds Musculoskeletal:no cyanosis of digits and no clubbing Extremities 1+ edema bilaterally. PSYCH: alert & oriented x 3 with fluent speech NEURO: no focal motor/sensory deficits  LABORATORY DATA:  I have reviewed the data as listed Lab Results  Component Value Date   WBC 8.1 07/05/2017   HGB 10.6 (L) 07/05/2017   HCT 32.2 (L) 07/05/2017   MCV 92 07/05/2017   PLT 211 07/05/2017     Chemistry      Component Value Date/Time   NA 142 07/05/2017 1046   K 5.0 07/05/2017 1046   CL 104 07/05/2017 1046   CO2 24 07/05/2017 1046   BUN 29 (H) 07/05/2017 1046   CREATININE 3.60 (H) 02/04/2018 1700   CREATININE 1.41 (H) 05/15/2013 1150      Component Value Date/Time   CALCIUM 9.1 07/05/2017 1046   ALKPHOS 87 07/05/2017 1046   AST 18 07/05/2017 1046   ALT 12 07/05/2017 1046   BILITOT 0.3 07/05/2017 1046       RADIOGRAPHIC STUDIES: I have personally reviewed the radiological images as listed and agreed with the findings in the report. Dg Chest 2 View  Result Date: 02/05/2018 CLINICAL DATA:  Wheezing and chest congestion. History of COPD, atrial fibrillation, chronic renal disease, current smoker. EXAM: CHEST - 2 VIEW COMPARISON:  PA and lateral chest x-ray of March 01, 2017 FINDINGS: The lungs are mildly hyperinflated. There is a new nodule in the retrocardiac region on the left. No abnormal nodules are observed elsewhere. The heart and pulmonary vascularity are normal. The mediastinum is normal in width. There is chronic deviation of the trachea to the left in the lower neck and at the thoracic inlet. There is multilevel degenerative disc disease of the thoracic spine. IMPRESSION: COPD. New abnormal soft tissue density nodule measuring  approximately 2 x 2 x 3 cm in the left lower lobe. Chest CT scanning  is recommended to evaluate this finding more completely and exclude malignancy. Thoracic aortic atherosclerosis. Chronic deviation of the trachea toward the left at the base the neck may reflect a goiter. These results will be called to the ordering clinician or representative by the Radiologist Assistant, and communication documented in the PACS or zVision Dashboard. Electronically Signed   By: David  Martinique M.D.   On: 02/05/2018 07:16   Ct Chest Wo Contrast  Result Date: 02/04/2018 CLINICAL DATA:  Follow-up lung nodule, shortness of breath, chest congestion, smoker, history prostate cancer, COPD, hypertension, stage III chronic kidney disease, paroxysmal atrial fibrillation EXAM: CT CHEST WITHOUT CONTRAST TECHNIQUE: Multidetector CT imaging of the chest was performed following the standard protocol without IV contrast. Sagittal and coronal MPR images reconstructed from axial data set. COMPARISON:  03/20/2014 FINDINGS: Cardiovascular: Atherosclerotic calcifications of thoracic aorta, proximal great vessels and coronary arteries. Aorta normal caliber. No pericardial effusion. Mediastinum/Nodes: Enlarged AP window lymph node 12 mm short axis image 74 new. Precarinal lymph node 10 mm short axis image 72, slightly increased. 10 mm RIGHT paratracheal node image 64. Multiple additional normal sized mediastinal nodes. Esophagus unremarkable. Base of cervical region normal appearance. Lungs/Pleura: Emphysematous changes with central peribronchial thickening. New macrolobulated soft tissue mass with spiculated margins in anterior LEFT lower lobe 2.9 x 2.1 x 2.0 cm in size consistent with a pulmonary neoplasm. This does not correspond in position with a questioned basilar opacity on a prior chest x-ray from 2015. Focal area of infiltrate versus nodularity in the posteromedial RIGHT lower lobe 12 mm diameter image 94. Questionable tiny subpleural nodule  RIGHT upper lobe image 22. Minimal biapical scarring. No additional infiltrate, pleural effusion or pneumothorax. Upper Abdomen: Adrenal glands appear low in attenuation and diffusely nodular in appearance, slightly more prominent than on previous exam. Remaining visualized upper abdomen grossly unremarkable. Musculoskeletal: No acute osseous abnormalities. IMPRESSION: New 2.9 x 2.1 x 2.0 cm spiculated LEFT lower lobe mass consistent with a primary pulmonary neoplasm. Underlying emphysematous and bronchitic changes consistent with COPD. 12 mm area of opacity in the posteromedial RIGHT lower lobe question infiltrate, atelectasis, mass not completely excluded. New upper normal sized to minimally enlarged mediastinal lymph nodes. Extensive atherosclerotic calcifications including coronary arteries. Aortic Atherosclerosis (ICD10-I70.0) and Emphysema (ICD10-J43.9). These results will be called to the ordering clinician or representative by the Radiologist Assistant, and communication documented in the PACS or zVision Dashboard. Electronically Signed   By: Lavonia Dana M.D.   On: 02/04/2018 17:53    ASSESSMENT & PLAN:  Mass of lower lobe of left lung 1.  Left lower lobe lung mass: He has more than 50 pack year smoking history.  CT scan on 02/04/2018 shows left lower lobe lung mass with mediastinal adenopathy.  I discussed the findings with the patient and his step-son.  CT scan was limited for evaluation without contrast given his chronic kidney disease.  I will obtain a PET CT scan for further staging.  I will also obtain MRI of the brain.  PET/CT scan will allow [place] to pick the site for biopsy.  He is on Coumadin for his paroxysmal atrial fibrillation.  We will have to discontinue Coumadin prior to biopsy.  He also reports lower back pain for the last 2-3 days.  He thinks it could be coming from him working outside his home.  2.  Prostate cancer: He reportedly underwent prostatectomy in 2003.  He also had radiation  therapy in 2004.  Orders Placed This Encounter  Procedures  .  NM PET Image Initial (PI) Skull Base To Thigh    Order Specific Question:   If indicated for the ordered procedure, I authorize the administration of a radiopharmaceutical per Radiology protocol    Answer:   Yes    Order Specific Question:   Preferred imaging location?    Answer:   [facility]    Order Specific Question:   Radiology Contrast Protocol - do NOT remove file path    Answer:   [email or web address]    Order Specific Question:   Reason for Exam additional comments    Answer:   left lung mass  . MR Brain W Wo Contrast    Order Specific Question:   If indicated for the ordered procedure, I authorize the administration of contrast media per Radiology protocol    Answer:   Yes    Order Specific Question:   What is the patient's sedation requirement?    Answer:   No Sedation    Order Specific Question:   Does the patient have a pacemaker or implanted devices?    Answer:   No    Order Specific Question:   Radiology Contrast Protocol - do NOT remove file path    Answer:   [email or web address]    Order Specific Question:   Reason for Exam additional comments    Answer:   lung cancer work up    Order Specific Question:   Preferred imaging location?    Answer:   [facility] (table limit-350lbs)        Derek Jack, MD 02/12/2018 10:29 AM

## 2018-02-14 ENCOUNTER — Other Ambulatory Visit: Payer: Self-pay | Admitting: Family Medicine

## 2018-02-14 DIAGNOSIS — L570 Actinic keratosis: Secondary | ICD-10-CM | POA: Diagnosis not present

## 2018-02-14 DIAGNOSIS — L309 Dermatitis, unspecified: Secondary | ICD-10-CM | POA: Diagnosis not present

## 2018-02-14 DIAGNOSIS — C44629 Squamous cell carcinoma of skin of left upper limb, including shoulder: Secondary | ICD-10-CM | POA: Diagnosis not present

## 2018-02-14 DIAGNOSIS — D485 Neoplasm of uncertain behavior of skin: Secondary | ICD-10-CM | POA: Diagnosis not present

## 2018-02-18 ENCOUNTER — Ambulatory Visit (HOSPITAL_COMMUNITY)
Admission: RE | Admit: 2018-02-18 | Discharge: 2018-02-18 | Disposition: A | Discharge status: Home / Self Care | Payer: Medicare HMO | Source: Ambulatory Visit | Attending: Hematology | Admitting: Hematology

## 2018-02-18 DIAGNOSIS — I6782 Cerebral ischemia: Secondary | ICD-10-CM | POA: Insufficient documentation

## 2018-02-18 DIAGNOSIS — G319 Degenerative disease of nervous system, unspecified: Secondary | ICD-10-CM | POA: Diagnosis not present

## 2018-02-18 DIAGNOSIS — R918 Other nonspecific abnormal finding of lung field: Secondary | ICD-10-CM | POA: Diagnosis not present

## 2018-02-18 DIAGNOSIS — C3490 Malignant neoplasm of unspecified part of unspecified bronchus or lung: Secondary | ICD-10-CM | POA: Diagnosis not present

## 2018-02-19 ENCOUNTER — Other Ambulatory Visit: Payer: Self-pay | Admitting: Family Medicine

## 2018-02-21 ENCOUNTER — Encounter (HOSPITAL_COMMUNITY)
Admission: RE | Admit: 2018-02-21 | Discharge: 2018-02-21 | Disposition: A | Discharge status: Home / Self Care | Payer: Medicare HMO | Source: Ambulatory Visit | Attending: Hematology | Admitting: Hematology

## 2018-02-21 DIAGNOSIS — K8020 Calculus of gallbladder without cholecystitis without obstruction: Secondary | ICD-10-CM | POA: Diagnosis not present

## 2018-02-21 DIAGNOSIS — R918 Other nonspecific abnormal finding of lung field: Secondary | ICD-10-CM | POA: Diagnosis not present

## 2018-02-21 DIAGNOSIS — J90 Pleural effusion, not elsewhere classified: Secondary | ICD-10-CM | POA: Diagnosis not present

## 2018-02-21 LAB — GLUCOSE, CAPILLARY: Glucose-Capillary: 98 mg/dL (ref 65–99)

## 2018-02-21 MED ORDER — FLUDEOXYGLUCOSE F - 18 (FDG) INJECTION
8.2800 | Freq: Once | INTRAVENOUS | Status: AC | PRN
  Administered 2018-02-21: 8.28 via INTRAVENOUS

## 2018-02-26 ENCOUNTER — Other Ambulatory Visit: Payer: Self-pay | Admitting: Family Medicine

## 2018-03-04 ENCOUNTER — Encounter: Payer: Self-pay | Admitting: Family Medicine

## 2018-03-04 ENCOUNTER — Ambulatory Visit (INDEPENDENT_AMBULATORY_CARE_PROVIDER_SITE_OTHER): Payer: Medicare HMO

## 2018-03-04 ENCOUNTER — Ambulatory Visit (HOSPITAL_COMMUNITY): Payer: Medicare HMO | Admitting: Hematology

## 2018-03-04 ENCOUNTER — Ambulatory Visit (INDEPENDENT_AMBULATORY_CARE_PROVIDER_SITE_OTHER): Payer: Medicare HMO | Admitting: Family Medicine

## 2018-03-04 VITALS — BP 101/62 | HR 79 | Temp 96.7°F | Ht 69.0 in | Wt 162.0 lb

## 2018-03-04 DIAGNOSIS — M79604 Pain in right leg: Secondary | ICD-10-CM

## 2018-03-04 DIAGNOSIS — M7989 Other specified soft tissue disorders: Secondary | ICD-10-CM | POA: Diagnosis not present

## 2018-03-04 DIAGNOSIS — S8011XA Contusion of right lower leg, initial encounter: Secondary | ICD-10-CM | POA: Diagnosis not present

## 2018-03-04 DIAGNOSIS — S8991XA Unspecified injury of right lower leg, initial encounter: Secondary | ICD-10-CM | POA: Diagnosis not present

## 2018-03-04 NOTE — Progress Notes (Signed)
BP 101/62   Pulse 79   Temp (!) 96.7 F (35.9 C) (Oral)   Ht 5\' 9"  (1.753 m)   Wt 162 lb (73.5 kg)   BMI 23.92 kg/m    Subjective:    Patient ID: Don [name], male    DOB: [birth year], 77 y.o.   MRN: [id number]  HPI: Don [name] is a 77 y.o. male presenting on 03/04/2018 for Right leg pain after a fall   HPI Right leg pain after a fall 2 days ago patient was working in his yard on uneven ground and pulling a tarp and tripped over a root and then fell down and said he landed on a root on the right side of his lower leg and since then has had swelling and pain.  He also had swelling and pain in his right thigh as well but that subsided with ice and heat but the lower leg is swollen and continues to be painful.  He denies any fevers or chills.  He denies any redness or warmth.  The right calf and lower leg is about two thirds bigger than the left one.  He said initially he had trouble bearing weight on it but is not having trouble bearing weight on it but just more getting up and down from a seated position is causing pain in that calf and shin.  He denies any loss of range of motion.  Relevant past medical, surgical, family and social history reviewed and updated as indicated. Interim medical history since our last visit reviewed. Allergies and medications reviewed and updated.  Review of Systems  Constitutional: Negative for chills and fever.  Respiratory: Negative for shortness of breath and wheezing.   Cardiovascular: Negative for chest pain and leg swelling.  Musculoskeletal: Positive for arthralgias and myalgias. Negative for back pain and gait problem.  Skin: Negative for color change and rash.  All other systems reviewed and are negative.   Per HPI unless specifically indicated above   Allergies as of 03/04/2018      Reactions   Wellbutrin [bupropion] Anxiety      Medication List        Accurate as of 03/04/18 12:13 PM. Always use your most recent med list.          albuterol 108 (90 Base) MCG/ACT inhaler Commonly known as:  PROVENTIL HFA;VENTOLIN HFA Inhale 2 puffs into the lungs every 6 (six) hours as needed for wheezing or shortness of breath.   atorvastatin 20 MG tablet Commonly known as:  LIPITOR TAKE 1 TABLET DAILY   bisoprolol 10 MG tablet Commonly known as:  ZEBETA TAKE (1) TABLET TWICE A DAY.   calcitRIOL 0.25 MCG capsule Commonly known as:  ROCALTROL Take 1 capsule (0.25 mcg total) by mouth daily.   clonazePAM 1 MG tablet Commonly known as:  KLONOPIN TAKE 1/2 TABLET ONCE DAILY AS NEEDED FOR ANXIETY   diltiazem 180 MG 24 hr capsule Commonly known as:  CARDIZEM CD Take 1 capsule (180 mg total) by mouth 2 (two) times daily.   DULoxetine 60 MG capsule Commonly known as:  CYMBALTA Take 2 capsules (120 mg total) by mouth daily.   FERREX 150 150 MG capsule Generic drug:  iron polysaccharides TAKE (1) CAPSULE DAILY   loratadine 10 MG tablet Commonly known as:  CLARITIN Take 10 mg by mouth daily.   multivitamin with minerals Tabs tablet Take 1 tablet by mouth daily.   pantoprazole 40 MG tablet Commonly known as:  PROTONIX  TAKE (1) TABLET TWICE A DAY.   SUPER OMEGA-3 PO Take 2 capsules 2 (two) times daily by mouth.   terazosin 2 MG capsule Commonly known as:  HYTRIN TAKE 1 CAPSULE AT BEDTIME   terazosin 2 MG capsule Commonly known as:  HYTRIN TAKE 1 CAPSULE AT BEDTIME   tiotropium 18 MCG inhalation capsule Commonly known as:  SPIRIVA Place 1 capsule (18 mcg total) into inhaler and inhale daily.   triamcinolone cream 0.1 % Commonly known as:  KENALOG   warfarin 3 MG tablet Commonly known as:  COUMADIN Take as directed by the anticoagulation clinic. If you are unsure how to take this medication, talk to your nurse or doctor. Original instructions:  TAKE 1 TABLET DAILY OR AS DIRECTED          Objective:    BP 101/62   Pulse 79   Temp (!) 96.7 F (35.9 C) (Oral)   Ht 5\' 9"  (1.753 m)   Wt 162 lb  (73.5 kg)   BMI 23.92 kg/m   Wt Readings from Last 3 Encounters:  03/04/18 162 lb (73.5 kg)  02/12/18 166 lb (75.3 kg)  02/04/18 160 lb (72.6 kg)    Physical Exam  Constitutional: He is oriented to person, place, and time. He appears well-developed and well-nourished. No distress.  Eyes: Conjunctivae are normal. Right eye exhibits no discharge. No scleral icterus.  Neck: Neck supple. No thyromegaly present.  Cardiovascular: Normal rate, regular rhythm, normal heart sounds and intact distal pulses.  No murmur heard. Pulmonary/Chest: Effort normal and breath sounds normal. No respiratory distress. He has no wheezes.  Musculoskeletal: Normal range of motion.       Right lower leg: He exhibits tenderness (Tenderness in his calf and overlying his lower shin, leg measuring two thirds bigger than other leg.), bony tenderness and swelling (Distal pulses and sensation intact). He exhibits no deformity and no laceration.  Lymphadenopathy:    He has no cervical adenopathy.  Neurological: He is alert and oriented to person, place, and time. Coordination normal.  Skin: Skin is warm and dry. No rash noted. He is not diaphoretic.  Psychiatric: He has a normal mood and affect. His behavior is normal.  Nursing note and vitals reviewed.   Right leg x-ray: No acute bony abnormalities, already read by radiologist in [place]:   Problem List Items Addressed This Visit    None    Visit Diagnoses    Pain of right lower extremity    -  Primary   Relevant Orders   DG Tibia/Fibula Right (Completed)   Contusion of right lower leg, initial encounter       Recommended continuing ice and heat packs, if not improved in 1-2 weeks then come back       Follow up plan: Return if symptoms worsen or fail to improve.  Counseling provided for all of the vaccine components Orders Placed This Encounter  Procedures  . DG Tibia/Fibula Right    Caryl Pina, MD [facility] 03/04/2018, 12:13 PM

## 2018-03-08 ENCOUNTER — Telehealth: Payer: Self-pay | Admitting: Family Medicine

## 2018-03-08 NOTE — Telephone Encounter (Signed)
Spoke with patients son and he is going to call [employer] oncologist about PET scan.

## 2018-03-08 NOTE — Telephone Encounter (Signed)
[name] is rtn wendys call

## 2018-03-15 ENCOUNTER — Emergency Department (HOSPITAL_COMMUNITY): Payer: Medicare HMO

## 2018-03-15 ENCOUNTER — Encounter (HOSPITAL_COMMUNITY): Payer: Self-pay | Admitting: Emergency Medicine

## 2018-03-15 ENCOUNTER — Other Ambulatory Visit: Payer: Self-pay

## 2018-03-15 ENCOUNTER — Emergency Department (HOSPITAL_COMMUNITY)
Admission: EM | Admit: 2018-03-15 | Discharge: 2018-03-16 | Disposition: A | Discharge status: Other Acute Inpatient Hospital | Payer: Medicare HMO | Source: Home / Self Care | Attending: Emergency Medicine | Admitting: Emergency Medicine

## 2018-03-15 DIAGNOSIS — W19XXXA Unspecified fall, initial encounter: Secondary | ICD-10-CM | POA: Diagnosis not present

## 2018-03-15 DIAGNOSIS — Z7189 Other specified counseling: Secondary | ICD-10-CM | POA: Diagnosis not present

## 2018-03-15 DIAGNOSIS — S51801A Unspecified open wound of right forearm, initial encounter: Secondary | ICD-10-CM | POA: Diagnosis not present

## 2018-03-15 DIAGNOSIS — W1830XA Fall on same level, unspecified, initial encounter: Secondary | ICD-10-CM | POA: Diagnosis not present

## 2018-03-15 DIAGNOSIS — F323 Major depressive disorder, single episode, severe with psychotic features: Secondary | ICD-10-CM

## 2018-03-15 DIAGNOSIS — K625 Hemorrhage of anus and rectum: Secondary | ICD-10-CM | POA: Diagnosis present

## 2018-03-15 DIAGNOSIS — I10 Essential (primary) hypertension: Secondary | ICD-10-CM | POA: Diagnosis not present

## 2018-03-15 DIAGNOSIS — Z23 Encounter for immunization: Secondary | ICD-10-CM | POA: Diagnosis present

## 2018-03-15 DIAGNOSIS — E782 Mixed hyperlipidemia: Secondary | ICD-10-CM | POA: Diagnosis present

## 2018-03-15 DIAGNOSIS — S61412A Laceration without foreign body of left hand, initial encounter: Secondary | ICD-10-CM | POA: Diagnosis present

## 2018-03-15 DIAGNOSIS — R531 Weakness: Secondary | ICD-10-CM | POA: Diagnosis present

## 2018-03-15 DIAGNOSIS — I129 Hypertensive chronic kidney disease with stage 1 through stage 4 chronic kidney disease, or unspecified chronic kidney disease: Secondary | ICD-10-CM

## 2018-03-15 DIAGNOSIS — R5381 Other malaise: Secondary | ICD-10-CM | POA: Diagnosis not present

## 2018-03-15 DIAGNOSIS — F23 Brief psychotic disorder: Secondary | ICD-10-CM | POA: Diagnosis not present

## 2018-03-15 DIAGNOSIS — Z8546 Personal history of malignant neoplasm of prostate: Secondary | ICD-10-CM | POA: Diagnosis not present

## 2018-03-15 DIAGNOSIS — E785 Hyperlipidemia, unspecified: Secondary | ICD-10-CM | POA: Diagnosis not present

## 2018-03-15 DIAGNOSIS — K319 Disease of stomach and duodenum, unspecified: Secondary | ICD-10-CM | POA: Diagnosis present

## 2018-03-15 DIAGNOSIS — Z8249 Family history of ischemic heart disease and other diseases of the circulatory system: Secondary | ICD-10-CM | POA: Diagnosis not present

## 2018-03-15 DIAGNOSIS — R440 Auditory hallucinations: Secondary | ICD-10-CM | POA: Diagnosis not present

## 2018-03-15 DIAGNOSIS — F320 Major depressive disorder, single episode, mild: Secondary | ICD-10-CM | POA: Diagnosis not present

## 2018-03-15 DIAGNOSIS — S59911A Unspecified injury of right forearm, initial encounter: Secondary | ICD-10-CM | POA: Diagnosis not present

## 2018-03-15 DIAGNOSIS — I130 Hypertensive heart and chronic kidney disease with heart failure and stage 1 through stage 4 chronic kidney disease, or unspecified chronic kidney disease: Secondary | ICD-10-CM | POA: Diagnosis present

## 2018-03-15 DIAGNOSIS — I5032 Chronic diastolic (congestive) heart failure: Secondary | ICD-10-CM | POA: Diagnosis present

## 2018-03-15 DIAGNOSIS — J449 Chronic obstructive pulmonary disease, unspecified: Secondary | ICD-10-CM

## 2018-03-15 DIAGNOSIS — R4182 Altered mental status, unspecified: Secondary | ICD-10-CM | POA: Diagnosis not present

## 2018-03-15 DIAGNOSIS — Z046 Encounter for general psychiatric examination, requested by authority: Secondary | ICD-10-CM | POA: Insufficient documentation

## 2018-03-15 DIAGNOSIS — K3189 Other diseases of stomach and duodenum: Secondary | ICD-10-CM | POA: Diagnosis not present

## 2018-03-15 DIAGNOSIS — I480 Paroxysmal atrial fibrillation: Secondary | ICD-10-CM | POA: Diagnosis present

## 2018-03-15 DIAGNOSIS — W19XXXD Unspecified fall, subsequent encounter: Secondary | ICD-10-CM | POA: Diagnosis not present

## 2018-03-15 DIAGNOSIS — S51801D Unspecified open wound of right forearm, subsequent encounter: Secondary | ICD-10-CM | POA: Diagnosis not present

## 2018-03-15 DIAGNOSIS — Z7901 Long term (current) use of anticoagulants: Secondary | ICD-10-CM

## 2018-03-15 DIAGNOSIS — J90 Pleural effusion, not elsewhere classified: Secondary | ICD-10-CM | POA: Diagnosis not present

## 2018-03-15 DIAGNOSIS — D62 Acute posthemorrhagic anemia: Secondary | ICD-10-CM | POA: Diagnosis present

## 2018-03-15 DIAGNOSIS — Z515 Encounter for palliative care: Secondary | ICD-10-CM | POA: Diagnosis not present

## 2018-03-15 DIAGNOSIS — S61401A Unspecified open wound of right hand, initial encounter: Secondary | ICD-10-CM | POA: Diagnosis present

## 2018-03-15 DIAGNOSIS — F17210 Nicotine dependence, cigarettes, uncomplicated: Secondary | ICD-10-CM | POA: Diagnosis present

## 2018-03-15 DIAGNOSIS — Z79899 Other long term (current) drug therapy: Secondary | ICD-10-CM

## 2018-03-15 DIAGNOSIS — K254 Chronic or unspecified gastric ulcer with hemorrhage: Secondary | ICD-10-CM | POA: Diagnosis not present

## 2018-03-15 DIAGNOSIS — Z833 Family history of diabetes mellitus: Secondary | ICD-10-CM | POA: Diagnosis not present

## 2018-03-15 DIAGNOSIS — R443 Hallucinations, unspecified: Secondary | ICD-10-CM | POA: Diagnosis not present

## 2018-03-15 DIAGNOSIS — I4891 Unspecified atrial fibrillation: Secondary | ICD-10-CM

## 2018-03-15 DIAGNOSIS — F329 Major depressive disorder, single episode, unspecified: Secondary | ICD-10-CM | POA: Diagnosis present

## 2018-03-15 DIAGNOSIS — R45851 Suicidal ideations: Secondary | ICD-10-CM | POA: Diagnosis present

## 2018-03-15 DIAGNOSIS — R195 Other fecal abnormalities: Secondary | ICD-10-CM | POA: Diagnosis not present

## 2018-03-15 DIAGNOSIS — K8020 Calculus of gallbladder without cholecystitis without obstruction: Secondary | ICD-10-CM | POA: Diagnosis not present

## 2018-03-15 DIAGNOSIS — F322 Major depressive disorder, single episode, severe without psychotic features: Secondary | ICD-10-CM | POA: Diagnosis not present

## 2018-03-15 DIAGNOSIS — R791 Abnormal coagulation profile: Secondary | ICD-10-CM | POA: Diagnosis not present

## 2018-03-15 DIAGNOSIS — D649 Anemia, unspecified: Secondary | ICD-10-CM | POA: Diagnosis not present

## 2018-03-15 DIAGNOSIS — S51811A Laceration without foreign body of right forearm, initial encounter: Secondary | ICD-10-CM | POA: Diagnosis present

## 2018-03-15 DIAGNOSIS — J9601 Acute respiratory failure with hypoxia: Secondary | ICD-10-CM | POA: Diagnosis present

## 2018-03-15 DIAGNOSIS — N184 Chronic kidney disease, stage 4 (severe): Secondary | ICD-10-CM | POA: Diagnosis present

## 2018-03-15 DIAGNOSIS — D500 Iron deficiency anemia secondary to blood loss (chronic): Secondary | ICD-10-CM | POA: Diagnosis not present

## 2018-03-15 DIAGNOSIS — I482 Chronic atrial fibrillation: Secondary | ICD-10-CM | POA: Diagnosis present

## 2018-03-15 DIAGNOSIS — H3530 Unspecified macular degeneration: Secondary | ICD-10-CM | POA: Diagnosis present

## 2018-03-15 DIAGNOSIS — N183 Chronic kidney disease, stage 3 (moderate): Secondary | ICD-10-CM | POA: Diagnosis not present

## 2018-03-15 DIAGNOSIS — J9811 Atelectasis: Secondary | ICD-10-CM | POA: Diagnosis not present

## 2018-03-15 LAB — COMPREHENSIVE METABOLIC PANEL
ALK PHOS: 98 U/L (ref 38–126)
ALT: 12 U/L — ABNORMAL LOW (ref 17–63)
ANION GAP: 11 (ref 5–15)
AST: 20 U/L (ref 15–41)
Albumin: 2.9 g/dL — ABNORMAL LOW (ref 3.5–5.0)
BILIRUBIN TOTAL: 0.9 mg/dL (ref 0.3–1.2)
BUN: 41 mg/dL — ABNORMAL HIGH (ref 6–20)
CALCIUM: 8.7 mg/dL — AB (ref 8.9–10.3)
CO2: 22 mmol/L (ref 22–32)
Chloride: 109 mmol/L (ref 101–111)
Creatinine, Ser: 3.57 mg/dL — ABNORMAL HIGH (ref 0.61–1.24)
GFR calc non Af Amer: 15 mL/min — ABNORMAL LOW (ref >60)
GFR, EST AFRICAN AMERICAN: 17 mL/min — AB (ref >60)
GLUCOSE: 98 mg/dL (ref 65–99)
Potassium: 4.4 mmol/L (ref 3.5–5.1)
Sodium: 142 mmol/L (ref 135–145)
TOTAL PROTEIN: 6.1 g/dL — AB (ref 6.5–8.1)

## 2018-03-15 LAB — DIFFERENTIAL
BASOS ABS: 0 10*3/uL (ref 0.0–0.1)
BASOS PCT: 0 %
EOS ABS: 0.1 10*3/uL (ref 0.0–0.7)
EOS PCT: 2 %
Lymphocytes Relative: 21 %
Lymphs Abs: 1.6 10*3/uL (ref 0.7–4.0)
MONOS PCT: 6 %
Monocytes Absolute: 0.5 10*3/uL (ref 0.1–1.0)
Neutro Abs: 5.4 10*3/uL (ref 1.7–7.7)
Neutrophils Relative %: 71 %

## 2018-03-15 LAB — RAPID URINE DRUG SCREEN, HOSP PERFORMED
Amphetamines: NOT DETECTED
Barbiturates: NOT DETECTED
Benzodiazepines: NOT DETECTED
Cocaine: NOT DETECTED
Opiates: NOT DETECTED
Tetrahydrocannabinol: NOT DETECTED

## 2018-03-15 LAB — CBC
HCT: 28.8 % — ABNORMAL LOW (ref 39.0–52.0)
HEMOGLOBIN: 9.5 g/dL — AB (ref 13.0–17.0)
MCH: 31.6 pg (ref 26.0–34.0)
MCHC: 33 g/dL (ref 30.0–36.0)
MCV: 95.7 fL (ref 78.0–100.0)
PLATELETS: 222 10*3/uL (ref 150–400)
RBC: 3.01 MIL/uL — AB (ref 4.22–5.81)
RDW: 16.6 % — ABNORMAL HIGH (ref 11.5–15.5)
WBC: 7.4 10*3/uL (ref 4.0–10.5)

## 2018-03-15 LAB — PROTIME-INR
INR: 6.48
PROTHROMBIN TIME: 56.4 s — AB (ref 11.4–15.2)

## 2018-03-15 LAB — URINALYSIS, ROUTINE W REFLEX MICROSCOPIC
BILIRUBIN URINE: NEGATIVE
Bacteria, UA: NONE SEEN
Glucose, UA: NEGATIVE mg/dL
HGB URINE DIPSTICK: NEGATIVE
KETONES UR: NEGATIVE mg/dL
Leukocytes, UA: NEGATIVE
NITRITE: NEGATIVE
PROTEIN: 100 mg/dL — AB
Specific Gravity, Urine: 1.013 (ref 1.005–1.030)
pH: 5 (ref 5.0–8.0)

## 2018-03-15 LAB — ETHANOL: Alcohol, Ethyl (B): <10 mg/dL (ref <10)

## 2018-03-15 LAB — CBG MONITORING, ED: GLUCOSE-CAPILLARY: 103 mg/dL — AB (ref 65–99)

## 2018-03-15 LAB — CK: Total CK: 37 U/L — ABNORMAL LOW (ref 49–397)

## 2018-03-15 MED ORDER — SODIUM CHLORIDE 0.9 % IV BOLUS
1000.0000 mL | Freq: Once | INTRAVENOUS | Status: AC
  Administered 2018-03-15: 1000 mL via INTRAVENOUS

## 2018-03-15 MED ORDER — ALBUTEROL SULFATE HFA 108 (90 BASE) MCG/ACT IN AERS
2.0000 | INHALATION_SPRAY | Freq: Four times a day (QID) | RESPIRATORY_TRACT | Status: DC | PRN
Start: 2018-03-15 — End: 2018-03-16

## 2018-03-15 MED ORDER — CALCITRIOL 0.25 MCG PO CAPS
0.2500 ug | ORAL_CAPSULE | Freq: Every day | ORAL | Status: DC
  Administered 2018-03-16: 0.25 ug via ORAL
  Filled 2018-03-15 (×3): qty 1

## 2018-03-15 MED ORDER — CLONAZEPAM 0.5 MG PO TABS
0.5000 mg | ORAL_TABLET | Freq: Two times a day (BID) | ORAL | Status: DC | PRN
  Administered 2018-03-16: 0.5 mg via ORAL
  Filled 2018-03-15: qty 1

## 2018-03-15 MED ORDER — BISOPROLOL FUMARATE 5 MG PO TABS
10.0000 mg | ORAL_TABLET | Freq: Every day | ORAL | Status: DC
  Administered 2018-03-16: 10 mg via ORAL
  Filled 2018-03-15 (×3): qty 1

## 2018-03-15 MED ORDER — ATORVASTATIN CALCIUM 10 MG PO TABS
20.0000 mg | ORAL_TABLET | Freq: Every day | ORAL | Status: DC
  Administered 2018-03-16: 20 mg via ORAL
  Filled 2018-03-15: qty 2

## 2018-03-15 NOTE — ED Provider Notes (Signed)
[facility] EMERGENCY DEPARTMENT Provider Note   CSN: [id number] Arrival date & time: 03/15/18  1847     History   Chief Complaint Chief Complaint  Patient presents with  . Altered Mental Status    HPI Don [name] is a 77 y.o. male.  Patient is depressed has not been sleeping.  He has been having visual and auditory hallucinations.  The patient has called the police several times with hallucinations and was found outside laying in a ditch  The history is provided by the patient. No language interpreter was used.  Altered Mental Status   This is a new problem. The current episode started more than 2 days ago. The problem has not changed since onset.Associated symptoms include confusion. Pertinent negatives include no seizures and no hallucinations. Risk factors include the patient not taking medications correctly. His past medical history does not include seizures.    Past Medical History:  Diagnosis Date  . Acute bronchitis 04/03/2014  . Anxiety   . Atrial fibrillation ([place])   . Cataract   . COPD (chronic obstructive pulmonary disease) ([place])   . Essential hypertension   . Hyperlipidemia   . Macular degeneration   . Noncompliance with medications 04/2013   Xarelto, digoxin previously  . Pre-diabetes   . Prostate cancer ([place])   . Stage III chronic kidney disease 04/03/2014  . Tubular adenoma of colon 07/31/02, 11/18/03    Patient Active Problem List   Diagnosis Date Noted  . Mass of lower lobe of left lung 02/12/2018  . Depression, recurrent ([place]) 01/02/2018  . Anemia   . Normocytic anemia   . Coagulopathy ([place]) 01/31/2017  . Pancytopenia ([place]) 01/31/2017  . Respiratory distress   . Pre-diabetes 07/23/2015  . Metabolic syndrome 89/38/1017  . Urinary bladder incontinence 03/04/2015  . Paroxysmal atrial fibrillation ([place]) 05/25/2014  . Hyperlipidemia LDL goal <130 04/04/2014  . Personal history of colonic polyps 04/04/2014  . History of prostate cancer 04/03/2014    . Stage III chronic kidney disease ([place]) 04/03/2014  . Tobacco abuse 04/25/2013  . Prostate cancer ([place]) 02/08/2011  . COPD (chronic obstructive pulmonary disease) ([place]) 02/08/2011  . Hypertension 02/08/2011    Past Surgical History:  Procedure Laterality Date  . Anal abcess,Hemorroids,    . COLONOSCOPY N/A 04/05/2014   Procedure: COLONOSCOPY;  Surgeon: Ladene Artist, MD;  Location: [facility] ENDOSCOPY;  Service: Endoscopy;  Laterality: N/A;  . COLONOSCOPY W/ BIOPSIES  11/18/2003   Dr. Earle Gell  . ESOPHAGOGASTRODUODENOSCOPY  11/18/2003   Dr. Earle Gell  . ESOPHAGOGASTRODUODENOSCOPY N/A 04/05/2014   Procedure: ESOPHAGOGASTRODUODENOSCOPY (EGD);  Surgeon: Ladene Artist, MD;  Location: [facility];  Service: Endoscopy;  Laterality: N/A;  . ESOPHAGOGASTRODUODENOSCOPY N/A 02/02/2017   Procedure: ESOPHAGOGASTRODUODENOSCOPY (EGD);  Surgeon: Danie Binder, MD;  Location: [facility] ENDO [facility]ITE;  Service: Endoscopy;  Laterality: N/A;  . RETROPUBIC PROSTATECTOMY  11/26/2001  . TONSILLECTOMY          Home Medications    Prior to Admission medications   Medication Sig Start Date End Date Taking? Authorizing Provider  FERREX 150 150 MG capsule TAKE (1) CAPSULE DAILY 01/24/18  Yes Dettinger, Fransisca Kaufmann, MD  Multiple Vitamin (MULTIVITAMIN WITH MINERALS) TABS Take 1 tablet by mouth daily.   Yes [provider]  Omega-3 Fatty Acids (SUPER OMEGA-3 PO) Take 2 capsules 2 (two) times daily by mouth.   Yes [provider]  tiotropium (SPIRIVA) 18 MCG inhalation capsule Place 1 capsule (18 mcg total) into inhaler and  inhale daily. 03/01/17  Yes Dettinger, Fransisca Kaufmann, MD  albuterol (PROVENTIL HFA;VENTOLIN HFA) 108 (90 Base) MCG/ACT inhaler Inhale 2 puffs into the lungs every 6 (six) hours as needed for wheezing or shortness of breath. 01/18/17   Eber Jones, MD  atorvastatin (LIPITOR) 20 MG tablet TAKE 1 TABLET DAILY 01/24/18   Dettinger, Fransisca Kaufmann, MD  bisoprolol (ZEBETA) 10 MG tablet  TAKE (1) TABLET TWICE A DAY. 02/27/18   Dettinger, Fransisca Kaufmann, MD  calcitRIOL (ROCALTROL) 0.25 MCG capsule Take 1 capsule (0.25 mcg total) by mouth daily. 02/12/18   Dettinger, Fransisca Kaufmann, MD  clonazePAM (KLONOPIN) 1 MG tablet TAKE 1/2 TABLET ONCE DAILY AS NEEDED FOR ANXIETY 01/24/18   Dettinger, Fransisca Kaufmann, MD  diltiazem (CARDIZEM CD) 180 MG 24 hr capsule Take 1 capsule (180 mg total) by mouth 2 (two) times daily. 02/06/18   Dettinger, Fransisca Kaufmann, MD  DULoxetine (CYMBALTA) 60 MG capsule Take 2 capsules (120 mg total) by mouth daily. 01/02/18   Dettinger, Fransisca Kaufmann, MD  loratadine (CLARITIN) 10 MG tablet Take 10 mg by mouth daily.    [provider]  pantoprazole (PROTONIX) 40 MG tablet TAKE (1) TABLET TWICE A DAY. 02/20/18   Dettinger, Fransisca Kaufmann, MD  terazosin (HYTRIN) 2 MG capsule TAKE 1 CAPSULE AT BEDTIME 11/26/17   Dettinger, Fransisca Kaufmann, MD  terazosin (HYTRIN) 2 MG capsule TAKE 1 CAPSULE AT BEDTIME 02/15/18   Dettinger, Fransisca Kaufmann, MD  triamcinolone cream (KENALOG) 0.1 %  12/31/17   [provider]  warfarin (COUMADIN) 3 MG tablet TAKE 1 TABLET DAILY OR AS DIRECTED 12/31/17   Dettinger, Fransisca Kaufmann, MD    Family History Family History  Problem Relation Age of Onset  . Diabetes Father   . Heart disease Father        MI  . Heart attack Father   . Heart attack Mother   . Heart disease Brother   . Cancer Brother        lung  . Lung cancer Brother   . Heart disease Brother   . Lung cancer Sister   . Cancer Sister        lung  . Heart disease Brother   . Heart disease Brother     Social History Social History   Tobacco Use  . Smoking status: Current Some Day Smoker    Packs/day: 0.50    Years: 62.00    Pack years: 31.00    Types: Cigarettes, Cigars    Last attempt to quit: 12/28/2016    Years since quitting: 1.2  . Smokeless tobacco: Never Used  Substance Use Topics  . Alcohol use: No  . Drug use: No     Allergies   Wellbutrin [bupropion]   Review of Systems Review of  Systems  Constitutional: Negative for appetite change and fatigue.  HENT: Negative for congestion, ear discharge and sinus pressure.   Eyes: Negative for discharge.  Respiratory: Negative for cough.   Cardiovascular: Negative for chest pain.  Gastrointestinal: Negative for abdominal pain and diarrhea.  Genitourinary: Negative for frequency and hematuria.  Musculoskeletal: Negative for back pain.  Skin: Negative for rash.  Neurological: Negative for seizures and headaches.  Psychiatric/Behavioral: Positive for confusion. Negative for hallucinations.     Physical Exam Updated Vital Signs BP (!) 159/87   Pulse 89   Resp 17   Ht 5\' 9"  (1.753 m)   Wt 73.5 kg (162 lb)   SpO2 96%   BMI 23.92 kg/m  Physical Exam  Constitutional: He is oriented to person, place, and time. He appears well-developed.  HENT:  Head: Normocephalic.  Eyes: Conjunctivae and EOM are normal. No scleral icterus.  Neck: Neck supple. No thyromegaly present.  Cardiovascular: Normal rate and regular rhythm. Exam reveals no gallop and no friction rub.  No murmur heard. Pulmonary/Chest: No stridor. He has no wheezes. He has no rales. He exhibits no tenderness.  Abdominal: He exhibits no distension. There is no tenderness. There is no rebound.  Musculoskeletal: Normal range of motion. He exhibits no edema.  Lymphadenopathy:    He has no cervical adenopathy.  Neurological: He is oriented to person, place, and time. He exhibits normal muscle tone. Coordination normal.  Skin: No rash noted. No erythema.  Psychiatric:  Auditory visual hallucinations along with depression     ED Treatments / Results  Labs (all labs ordered are listed, but only abnormal results are displayed) Labs Reviewed  COMPREHENSIVE METABOLIC PANEL - Abnormal; Notable for the following components:      Result Value   BUN 41 (*)    Creatinine, Ser 3.57 (*)    Calcium 8.7 (*)    Total Protein 6.1 (*)    Albumin 2.9 (*)    ALT 12 (*)     GFR calc non Af Amer 15 (*)    GFR calc Af Amer 17 (*)    All other components within normal limits  CBC - Abnormal; Notable for the following components:   RBC 3.01 (*)    Hemoglobin 9.5 (*)    HCT 28.8 (*)    RDW 16.6 (*)    All other components within normal limits  PROTIME-INR - Abnormal; Notable for the following components:   Prothrombin Time 56.4 (*)    INR 6.48 (*)    All other components within normal limits  CK - Abnormal; Notable for the following components:   Total CK 37 (*)    All other components within normal limits  URINALYSIS, ROUTINE W REFLEX MICROSCOPIC - Abnormal; Notable for the following components:   APPearance HAZY (*)    Protein, ur 100 (*)    RBC / HPF >50 (*)    All other components within normal limits  CBG MONITORING, ED - Abnormal; Notable for the following components:   Glucose-Capillary 103 (*)    All other components within normal limits  DIFFERENTIAL  ETHANOL  RAPID URINE DRUG SCREEN, HOSP PERFORMED    EKG EKG Interpretation  Date/Time:  Friday Mar 15 2018 18:58:39 EDT Ventricular Rate:  85 PR Interval:    QRS Duration: 101 QT Interval:  366 QTC Calculation: 436 R Axis:   78 Text Interpretation:  Atrial fibrillation Low voltage, extremity leads Confirmed by Milton Ferguson [phone]) on 03/15/2018 10:14:45 PM   Radiology Dg Chest 2 View  Result Date: 03/15/2018 CLINICAL DATA:  Altered mental status EXAM: CHEST - 2 VIEW COMPARISON:  PET-CT dated 02/21/2018 FINDINGS: Retrocardiac/left lower lobe pulmonary nodule, corresponding to suspected primary bronchogenic neoplasm on PET-CT. No focal consolidation. Small right pleural effusion. No pneumothorax. The heart is normal in size. Degenerative changes of the visualized thoracolumbar spine. IMPRESSION: Retrocardiac/left lower lobe pulmonary nodule, corresponding to suspected primary bronchogenic neoplasm on PET-CT. Small right pleural effusion. Electronically Signed   By: Julian Hy M.D.   On:  03/15/2018 20:10   Ct Head Wo Contrast  Result Date: 03/15/2018 CLINICAL DATA:  Altered mental status, hallucinations, suicidal/homicidal ideation EXAM: CT HEAD WITHOUT CONTRAST TECHNIQUE: Contiguous axial images were  obtained from the base of the skull through the vertex without intravenous contrast. COMPARISON:  MRI brain dated 02/18/2018 FINDINGS: Brain: No evidence of acute infarction, hemorrhage, hydrocephalus, extra-axial collection or mass lesion/mass effect. Cortical atrophy. Subcortical white matter and periventricular small vessel ischemic changes. Vascular: No hyperdense vessel or unexpected calcification. Skull: Normal. Negative for fracture or focal lesion. Sinuses/Orbits: Partial opacification of the right maxillary sinus. Mastoid air cells are clear. Other: None. IMPRESSION: No evidence of acute intracranial abnormality. Atrophy with small vessel ischemic changes. Electronically Signed   By: Julian Hy M.D.   On: 03/15/2018 20:02    Procedures Procedures (including critical care time)  Medications Ordered in ED Medications  sodium chloride 0.9 % bolus 1,000 mL (0 mLs Intravenous Stopped 03/15/18 2028)     Initial Impression / Assessment and Plan / ED Course  I have reviewed the triage vital signs and the nursing notes.  Pertinent labs & imaging results that were available during my care of the patient were reviewed by me and considered in my medical decision making (see chart for details).    CRITICAL CARE Performed by: Milton Ferguson Total critical care time: 35 minutes Critical care time was exclusive of separately billable procedures and treating other patients. Critical care was necessary to treat or prevent imminent or life-threatening deterioration. Critical care was time spent personally by me on the following activities: development of treatment plan with patient and/or surrogate as well as nursing, discussions with consultants, evaluation of patient's response to  treatment, examination of patient, obtaining history from patient or surrogate, ordering and performing treatments and interventions, ordering and review of laboratory studies, ordering and review of radiographic studies, pulse oximetry and re-evaluation of patient's condition. Patient has been having auditory and visual hallucinations.  He has been depressed he has not been sleeping although is not suicidal or homicidal.  Patient was seen by psychiatry and it was decided the patient needed inpatient treatment.  Patient is medically clear  Final Clinical Impressions(s) / ED Diagnoses   Final diagnoses:  None    ED Discharge Orders    None       Milton Ferguson, MD 03/15/18 2327

## 2018-03-15 NOTE — ED Triage Notes (Signed)
Pt has been having moments of confusion over the last 48 hours and states he has not slept in a few days. Called the police multiple times today with hallucinations. Pt now also states he called the police because he thought he might harm himself, pt denies SI/HI at this time. States his heart is broken. Pt was found outside lying in a ditch, pt knows he walked outside and stated he did so because he was mad.

## 2018-03-15 NOTE — ED Notes (Signed)
Critical result. INR 6.48. Dr. Roderic Palau notified.

## 2018-03-15 NOTE — BH Assessment (Addendum)
Tele Assessment Note   Patient Name: Don [name] MRN: [id number] Referring Physician: Milton Ferguson, MD Location of Patient: [facility] Location of Provider: [place] Department  Don [name] is an 77 y.o. male who presents to the ED voluntarily. Pt states he has been increasingly depressed and contemplating suicide. Pt reports he has thoughts of putting a shotgun in his mouth and pulling the trigger. Pt states he owns a shotgun but does not have any shells at present. Pt states he became upset today due to his wife being in a nursing home. Pt states he has been hallucinating that he is seeing his wife at home walking around although she lives in a nursing home and has not been home for the past 8 months. Pt denies that he has attempted suicide in the past.   Pt states he is not sleeping or eating. Pt states he has been intentionally trying to force himself to not eat. Pt states he has severe anxiety and takes medication to assist with his anxiety. TTS contacted the pt's son Don [name]) with the pt's permission and his son states he is concerned that the pt may try to harm himself due to his current thoughts of suicide.   Per chart pt called 911 multiple times due to psychosis and was found outside in a ditch today. Pt was alert and oriented x4 during the TTS assessment and did not appear to be confused. Pt answered questions appropriately during the assessment. Pt stated his "kin family" upset him because they want him to stop smoking cigarettes but they pressure him too much causing him to feel angry.   TTS consulted with Lindon Romp, NP who recommends inpt geropsych treatment. EDP Milton Ferguson, MD has been advised of the disposition.  Diagnosis: MDD, single episode, severe w/ psychosis; GAD, severe   Past Medical History:  Past Medical History:  Diagnosis Date  . Acute bronchitis 04/03/2014  . Anxiety   . Atrial fibrillation ([place])   . Cataract   . COPD (chronic  obstructive pulmonary disease) ([place])   . Essential hypertension   . Hyperlipidemia   . Macular degeneration   . Noncompliance with medications 04/2013   Xarelto, digoxin previously  . Pre-diabetes   . Prostate cancer ([place])   . Stage III chronic kidney disease 04/03/2014  . Tubular adenoma of colon 07/31/02, 11/18/03    Past Surgical History:  Procedure Laterality Date  . Anal abcess,Hemorroids,    . COLONOSCOPY N/A 04/05/2014   Procedure: COLONOSCOPY;  Surgeon: Ladene Artist, MD;  Location: [facility];  Service: Endoscopy;  Laterality: N/A;  . COLONOSCOPY W/ BIOPSIES  11/18/2003   Dr. Earle Gell  . ESOPHAGOGASTRODUODENOSCOPY  11/18/2003   Dr. Earle Gell  . ESOPHAGOGASTRODUODENOSCOPY N/A 04/05/2014   Procedure: ESOPHAGOGASTRODUODENOSCOPY (EGD);  Surgeon: Ladene Artist, MD;  Location: [facility];  Service: Endoscopy;  Laterality: N/A;  . ESOPHAGOGASTRODUODENOSCOPY N/A 02/02/2017   Procedure: ESOPHAGOGASTRODUODENOSCOPY (EGD);  Surgeon: Danie Binder, MD;  Location[facility];  Service: Endoscopy;  Laterality: N/A;  . RETROPUBIC PROSTATECTOMY  11/26/2001  . TONSILLECTOMY      Family History:  Family History  Problem Relation Age of Onset  . Diabetes Father   . Heart disease Father        MI  . Heart attack Father   . Heart attack Mother   . Heart disease Brother   . Cancer Brother        lung  . Lung cancer Brother   .  Heart disease Brother   . Lung cancer Sister   . Cancer Sister        lung  . Heart disease Brother   . Heart disease Brother     Social History:  reports that he has been smoking cigarettes and cigars.  He has a 31.00 pack-year smoking history. He has never used smokeless tobacco. He reports that he does not drink alcohol or use drugs.  Additional Social History:  Alcohol / Drug Use Pain Medications: See MAR Prescriptions: See MAR Over the Counter: See MAR History of alcohol / drug use?: Yes Substance #1 Name of Substance 1:  Cigarettes/Nicotine 1 - Age of First Use: unknown 1 - Amount (size/oz): 2 cigarettes 1 - Frequency: daily 1 - Duration: ongoing 1 - Last Use / Amount: 03/15/18  CIWA: CIWA-Ar BP: (!) 155/87 Pulse Rate: 98 COWS:    Allergies:  Allergies  Allergen Reactions  . Wellbutrin [Bupropion] Anxiety    Home Medications:  (Not in a hospital admission)  OB/GYN Status:  No LMP for male patient.  General Assessment Data Location of Assessment: [facility] TTS Assessment: In system Is this a Tele or Face-to-Face Assessment?: Tele Assessment Is this an Initial Assessment or a Re-assessment for this encounter?: Initial Assessment Marital status: Married Is patient pregnant?: No Pregnancy Status: No Living Arrangements: Alone Can pt return to current living arrangement?: Yes Admission Status: Voluntary Is patient capable of signing voluntary admission?: Yes Referral Source: Self/Family/Friend Insurance type: [facility]     Crisis Care Plan Living Arrangements: Alone Name of Psychiatrist: none Name of Therapist: none  Education Status Is patient currently in school?: No Is the patient employed, unemployed or receiving disability?: (retired)  Risk to self with the past 6 months Suicidal Ideation: Yes-Currently Present Has patient been a risk to self within the past 6 months prior to admission? : No Suicidal Intent: No Has patient had any suicidal intent within the past 6 months prior to admission? : No Is patient at risk for suicide?: Yes Suicidal Plan?: Yes-Currently Present Has patient had any suicidal plan within the past 6 months prior to admission? : Yes Specify Current Suicidal Plan: pt states he has thoughts of putting a shotgun in his mouth and pulling the trigger  Access to Means: Yes Specify Access to Suicidal Means: pt has access to a shotgun What has been your use of drugs/alcohol within the last 12 months?: denies use  Previous Attempts/Gestures: No Triggers for Past  Attempts: None known Intentional Self Injurious Behavior: None Family Suicide History: No Recent stressful life event(s): Conflict (Comment), Other (Comment)(psychosis, family conflict ) Persecutory voices/beliefs?: No Depression: Yes Depression Symptoms: Despondent, Insomnia, Tearfulness, Isolating, Fatigue, Guilt, Loss of interest in usual pleasures, Feeling worthless/self pity, Feeling angry/irritable Substance abuse history and/or treatment for substance abuse?: No Suicide prevention information given to non-admitted patients: Not applicable  Risk to Others within the past 6 months Homicidal Ideation: No Does patient have any lifetime risk of violence toward others beyond the six months prior to admission? : No Thoughts of Harm to Others: No Current Homicidal Intent: No Current Homicidal Plan: No Access to Homicidal Means: No History of harm to others?: No Assessment of Violence: None Noted Does patient have access to weapons?: Yes (Comment)(shotgun) Criminal Charges Pending?: No Does patient have a court date: No Is patient on probation?: No  Psychosis Hallucinations: Visual Delusions: Unspecified  Mental Status Report Appearance/Hygiene: Unremarkable Eye Contact: Good Motor Activity: Freedom of movement Speech: Logical/coherent Level of  Consciousness: Alert Mood: Depressed, Anxious Affect: Anxious, Depressed Anxiety Level: Severe Thought Processes: Relevant, Coherent Judgement: Impaired Orientation: Person, Place, Time, Situation, Appropriate for developmental age Obsessive Compulsive Thoughts/Behaviors: None  Cognitive Functioning Concentration: Normal Memory: Remote Intact, Recent Intact Is patient IDD: No Is patient DD?: No Insight: Poor Impulse Control: Poor Appetite: Poor Have you had any weight changes? : No Change Sleep: Decreased Total Hours of Sleep: 5 Vegetative Symptoms: None  ADLScreening [facility] Assessment Services) Patient's cognitive ability  adequate to safely complete daily activities?: Yes Patient able to express need for assistance with ADLs?: Yes Independently performs ADLs?: Yes (appropriate for developmental age)  Prior Inpatient Therapy Prior Inpatient Therapy: No  Prior Outpatient Therapy Prior Outpatient Therapy: No Does patient have an ACCT team?: No Does patient have Intensive In-House Services?  : No Does patient have [facility] services? : No Does patient have P4CC services?: No  ADL Screening (condition at time of admission) Patient's cognitive ability adequate to safely complete daily activities?: Yes Is the patient deaf or have difficulty hearing?: No Does the patient have difficulty seeing, even when wearing glasses/contacts?: No Does the patient have difficulty concentrating, remembering, or making decisions?: No Patient able to express need for assistance with ADLs?: Yes Does the patient have difficulty dressing or bathing?: No Independently performs ADLs?: Yes (appropriate for developmental age) Does the patient have difficulty walking or climbing stairs?: No Weakness of Legs: None Weakness of Arms/Hands: None  Home Assistive Devices/Equipment Home Assistive Devices/Equipment: None    Abuse/Neglect Assessment (Assessment to be complete while patient is alone) Abuse/Neglect Assessment Can Be Completed: Yes Physical Abuse: Denies Verbal Abuse: Denies Sexual Abuse: Denies Exploitation of patient/patient's resources: Denies Self-Neglect: Denies     Regulatory affairs officer (For Healthcare) Does Patient Have a Medical Advance Directive?: Yes Does patient want to make changes to medical advance directive?: No - Patient declined Type of Advance Directive: [place] in Chart?: No - copy requested    Additional Information 1:1 In Past 12 Months?: No CIRT Risk: No Elopement Risk: No Does patient have medical clearance?: (pending)      Disposition:  TTS consulted with Lindon Romp, NP who recommends inpt geropsych treatment. EDP Milton Ferguson, MD has been advised of the disposition.  Disposition Initial Assessment Completed for this Encounter: Yes Disposition of Patient: Admit Type of inpatient treatment program: Adult(per Lindon Romp, NP) Patient refused recommended treatment: No  This service was provided via telemedicine using a 2-way, interactive audio and video technology.  Names of all persons participating in this telemedicine service and their role in this encounter. Name: Letroy Vazguez Role: Patient  Name: Lind Covert Role: TTS          Lyanne Co 03/15/2018 11:41 PM

## 2018-03-15 NOTE — ED Notes (Signed)
Patient on cardiac monitor at this time.

## 2018-03-15 NOTE — Progress Notes (Signed)
TTS consulted with Lindon Romp, NP who recommends inpt geropsych treatment. EDP Milton Ferguson, MD has been advised of the disposition.  Lind Covert, MSW, LCSW Therapeutic Triage Specialist  [phone]

## 2018-03-16 ENCOUNTER — Emergency Department (HOSPITAL_COMMUNITY): Payer: Medicare HMO

## 2018-03-16 ENCOUNTER — Encounter (HOSPITAL_COMMUNITY): Payer: Self-pay | Admitting: Emergency Medicine

## 2018-03-16 ENCOUNTER — Other Ambulatory Visit: Payer: Self-pay

## 2018-03-16 ENCOUNTER — Inpatient Hospital Stay (HOSPITAL_COMMUNITY)
Admission: EM | Admit: 2018-03-16 | Discharge: 2018-03-29 | DRG: 308 | Disposition: A | Discharge status: Skilled Nursing Facility | Payer: Medicare HMO | Attending: Internal Medicine | Admitting: Internal Medicine

## 2018-03-16 DIAGNOSIS — I480 Paroxysmal atrial fibrillation: Principal | ICD-10-CM | POA: Diagnosis present

## 2018-03-16 DIAGNOSIS — I5032 Chronic diastolic (congestive) heart failure: Secondary | ICD-10-CM | POA: Diagnosis present

## 2018-03-16 DIAGNOSIS — R791 Abnormal coagulation profile: Secondary | ICD-10-CM | POA: Diagnosis not present

## 2018-03-16 DIAGNOSIS — W1830XA Fall on same level, unspecified, initial encounter: Secondary | ICD-10-CM | POA: Diagnosis present

## 2018-03-16 DIAGNOSIS — Z7901 Long term (current) use of anticoagulants: Secondary | ICD-10-CM

## 2018-03-16 DIAGNOSIS — N183 Chronic kidney disease, stage 3 unspecified: Secondary | ICD-10-CM | POA: Diagnosis present

## 2018-03-16 DIAGNOSIS — R45851 Suicidal ideations: Secondary | ICD-10-CM

## 2018-03-16 DIAGNOSIS — Z515 Encounter for palliative care: Secondary | ICD-10-CM

## 2018-03-16 DIAGNOSIS — F17210 Nicotine dependence, cigarettes, uncomplicated: Secondary | ICD-10-CM | POA: Diagnosis present

## 2018-03-16 DIAGNOSIS — R0902 Hypoxemia: Secondary | ICD-10-CM

## 2018-03-16 DIAGNOSIS — I130 Hypertensive heart and chronic kidney disease with heart failure and stage 1 through stage 4 chronic kidney disease, or unspecified chronic kidney disease: Secondary | ICD-10-CM | POA: Diagnosis present

## 2018-03-16 DIAGNOSIS — D649 Anemia, unspecified: Secondary | ICD-10-CM | POA: Diagnosis present

## 2018-03-16 DIAGNOSIS — R195 Other fecal abnormalities: Secondary | ICD-10-CM

## 2018-03-16 DIAGNOSIS — Z8546 Personal history of malignant neoplasm of prostate: Secondary | ICD-10-CM

## 2018-03-16 DIAGNOSIS — K319 Disease of stomach and duodenum, unspecified: Secondary | ICD-10-CM | POA: Diagnosis present

## 2018-03-16 DIAGNOSIS — W19XXXA Unspecified fall, initial encounter: Secondary | ICD-10-CM

## 2018-03-16 DIAGNOSIS — E860 Dehydration: Secondary | ICD-10-CM | POA: Diagnosis present

## 2018-03-16 DIAGNOSIS — Z7189 Other specified counseling: Secondary | ICD-10-CM

## 2018-03-16 DIAGNOSIS — I482 Chronic atrial fibrillation: Secondary | ICD-10-CM | POA: Diagnosis present

## 2018-03-16 DIAGNOSIS — S51811A Laceration without foreign body of right forearm, initial encounter: Secondary | ICD-10-CM | POA: Diagnosis present

## 2018-03-16 DIAGNOSIS — E782 Mixed hyperlipidemia: Secondary | ICD-10-CM | POA: Diagnosis present

## 2018-03-16 DIAGNOSIS — J449 Chronic obstructive pulmonary disease, unspecified: Secondary | ICD-10-CM | POA: Diagnosis present

## 2018-03-16 DIAGNOSIS — E785 Hyperlipidemia, unspecified: Secondary | ICD-10-CM | POA: Diagnosis present

## 2018-03-16 DIAGNOSIS — S61412A Laceration without foreign body of left hand, initial encounter: Secondary | ICD-10-CM | POA: Diagnosis present

## 2018-03-16 DIAGNOSIS — N184 Chronic kidney disease, stage 4 (severe): Secondary | ICD-10-CM | POA: Diagnosis present

## 2018-03-16 DIAGNOSIS — Z8249 Family history of ischemic heart disease and other diseases of the circulatory system: Secondary | ICD-10-CM

## 2018-03-16 DIAGNOSIS — I10 Essential (primary) hypertension: Secondary | ICD-10-CM | POA: Diagnosis present

## 2018-03-16 DIAGNOSIS — F329 Major depressive disorder, single episode, unspecified: Secondary | ICD-10-CM

## 2018-03-16 DIAGNOSIS — Z79899 Other long term (current) drug therapy: Secondary | ICD-10-CM

## 2018-03-16 DIAGNOSIS — H3530 Unspecified macular degeneration: Secondary | ICD-10-CM | POA: Diagnosis present

## 2018-03-16 DIAGNOSIS — Z833 Family history of diabetes mellitus: Secondary | ICD-10-CM

## 2018-03-16 DIAGNOSIS — S51801A Unspecified open wound of right forearm, initial encounter: Secondary | ICD-10-CM

## 2018-03-16 DIAGNOSIS — J9601 Acute respiratory failure with hypoxia: Secondary | ICD-10-CM | POA: Diagnosis present

## 2018-03-16 DIAGNOSIS — D62 Acute posthemorrhagic anemia: Secondary | ICD-10-CM | POA: Diagnosis present

## 2018-03-16 DIAGNOSIS — K625 Hemorrhage of anus and rectum: Secondary | ICD-10-CM | POA: Diagnosis present

## 2018-03-16 DIAGNOSIS — I4891 Unspecified atrial fibrillation: Secondary | ICD-10-CM | POA: Diagnosis present

## 2018-03-16 DIAGNOSIS — S61401A Unspecified open wound of right hand, initial encounter: Secondary | ICD-10-CM | POA: Diagnosis present

## 2018-03-16 DIAGNOSIS — Z23 Encounter for immunization: Secondary | ICD-10-CM

## 2018-03-16 LAB — CBC WITH DIFFERENTIAL/PLATELET
BASOS ABS: 0 10*3/uL (ref 0.0–0.1)
BASOS PCT: 0 %
EOS ABS: 0.1 10*3/uL (ref 0.0–0.7)
Eosinophils Relative: 1 %
HCT: 30.7 % — ABNORMAL LOW (ref 39.0–52.0)
Hemoglobin: 10.2 g/dL — ABNORMAL LOW (ref 13.0–17.0)
LYMPHS PCT: 11 %
Lymphs Abs: 1.2 10*3/uL (ref 0.7–4.0)
MCH: 31.7 pg (ref 26.0–34.0)
MCHC: 33.2 g/dL (ref 30.0–36.0)
MCV: 95.3 fL (ref 78.0–100.0)
MONO ABS: 0.9 10*3/uL (ref 0.1–1.0)
Monocytes Relative: 8 %
Neutro Abs: 9.5 10*3/uL — ABNORMAL HIGH (ref 1.7–7.7)
Neutrophils Relative %: 80 %
PLATELETS: 249 10*3/uL (ref 150–400)
RBC: 3.22 MIL/uL — AB (ref 4.22–5.81)
RDW: 16.2 % — AB (ref 11.5–15.5)
WBC: 11.8 10*3/uL — AB (ref 4.0–10.5)

## 2018-03-16 LAB — COMPREHENSIVE METABOLIC PANEL
ALT: 13 U/L — AB (ref 17–63)
AST: 17 U/L (ref 15–41)
Albumin: 2.8 g/dL — ABNORMAL LOW (ref 3.5–5.0)
Alkaline Phosphatase: 99 U/L (ref 38–126)
Anion gap: 8 (ref 5–15)
BUN: 41 mg/dL — AB (ref 6–20)
CHLORIDE: 111 mmol/L (ref 101–111)
CO2: 25 mmol/L (ref 22–32)
Calcium: 8.9 mg/dL (ref 8.9–10.3)
Creatinine, Ser: 3.3 mg/dL — ABNORMAL HIGH (ref 0.61–1.24)
GFR calc Af Amer: 19 mL/min — ABNORMAL LOW (ref >60)
GFR calc non Af Amer: 17 mL/min — ABNORMAL LOW (ref >60)
Glucose, Bld: 122 mg/dL — ABNORMAL HIGH (ref 65–99)
POTASSIUM: 4.7 mmol/L (ref 3.5–5.1)
SODIUM: 144 mmol/L (ref 135–145)
Total Bilirubin: 1.1 mg/dL (ref 0.3–1.2)
Total Protein: 6 g/dL — ABNORMAL LOW (ref 6.5–8.1)

## 2018-03-16 LAB — PROTIME-INR
INR: 5.9
PROTHROMBIN TIME: 52.4 s — AB (ref 11.4–15.2)

## 2018-03-16 MED ORDER — "THROMBI-PAD 3""X3"" EX PADS"
MEDICATED_PAD | CUTANEOUS | Status: AC
  Administered 2018-03-16: 2
  Filled 2018-03-16: qty 2

## 2018-03-16 MED ORDER — ACETAMINOPHEN 325 MG PO TABS
650.0000 mg | ORAL_TABLET | Freq: Once | ORAL | Status: AC
  Administered 2018-03-16: 650 mg via ORAL
  Filled 2018-03-16: qty 2

## 2018-03-16 MED ORDER — VITAMIN K1 10 MG/ML IJ SOLN
5.0000 mg | Freq: Once | INTRAMUSCULAR | Status: AC
  Administered 2018-03-16: 5 mg via SUBCUTANEOUS
  Filled 2018-03-16: qty 1

## 2018-03-16 MED ORDER — CEPHALEXIN 500 MG PO CAPS
500.0000 mg | ORAL_CAPSULE | Freq: Once | ORAL | Status: AC
  Administered 2018-03-16: 500 mg via ORAL
  Filled 2018-03-16: qty 1

## 2018-03-16 MED ORDER — MORPHINE SULFATE (PF) 2 MG/ML IV SOLN
2.0000 mg | Freq: Once | INTRAVENOUS | Status: AC
  Administered 2018-03-16: 2 mg via INTRAVENOUS
  Filled 2018-03-16: qty 1

## 2018-03-16 MED ORDER — TETANUS-DIPHTH-ACELL PERTUSSIS 5-2.5-18.5 LF-MCG/0.5 IM SUSP
0.5000 mL | Freq: Once | INTRAMUSCULAR | Status: AC
  Administered 2018-03-16: 0.5 mL via INTRAMUSCULAR
  Filled 2018-03-16: qty 0.5

## 2018-03-16 MED ORDER — CEPHALEXIN 500 MG PO CAPS: 500.0000 mg | ORAL_CAPSULE | Freq: Three times a day (TID) | ORAL | 0 refills | Status: DC

## 2018-03-16 NOTE — ED Notes (Signed)
Pt re evaluated by TTS at this time

## 2018-03-16 NOTE — Progress Notes (Addendum)
Patient has been accepted to [facility]. Patient is IVC. The reporting number is [phone]. The accepting provider is Dr. Althia Forts. They are looking forward to the patient arriving today. CSW has called POA multiple times to let him know the updates but it has been unsuccessful. POA is not answering his phone and there is no voicemail options. His POA/Son's name is [name] and his phone number is [phone].   CSW has informed patient's nurse of the accepting information.   Herbert Moors MSW, LCSW-A, LCAS-A Clinical Social Worker 03/16/2018 5:09 PM

## 2018-03-16 NOTE — BH Assessment (Deleted)
Re-assessment: Patient reassessed on this date.  Patient reports getting adequate sleep last night and feeling rested this morning.  Patient reports having an appetite and plans to eat breakfast this morning.  Patient denied current SI and reports last episode was during lunch yesterday when she told her Teacher prior to being hospitalized.  Patient also denied current HI and AVH.  Patient reports looking forward to Mother coming to visit today.

## 2018-03-16 NOTE — ED Notes (Signed)
Spoke with pt. POA, POA would like to establish a password for pt.  Password - [id number].

## 2018-03-16 NOTE — ED Notes (Addendum)
Pts POA (son Don [name]) called RN upset about arrangments made for his father. AC and Charge Nurse aware of the situation. RN spoke with POA and explained the Pt had been IVC'd and the Education officer, museum had made multiple attempts to reach the POA earlier that day without success. MD Notified.

## 2018-03-16 NOTE — ED Triage Notes (Addendum)
Pt escorted by [facility] department slipped and fell attempting to get into police car.  Pt has skin tears to lt elbow, lt hand and rt forearm.

## 2018-03-16 NOTE — Progress Notes (Signed)
Report called to accepting facility.  Multiple attempts made by staff to contact POA for update on patient placement. POA did not answer calls and no voicemail available for POA.

## 2018-03-16 NOTE — ED Notes (Signed)
Charge Nurse called [facility] to verify they would still accept Pt, and they declined this evening, stating they would not accept the Pt who has an active bleed and a INR of 5.9. Pt is set to be re-evaluated in the AM. [facility] made aware of situation. MD Notified.

## 2018-03-16 NOTE — BH Assessment (Addendum)
Reassessment: Patient was reassessed on this date:  Patient presented orientated x2, thought process circumstantial, mood unable to assess.  Patient reports not sleeping at all last night.  He reports having an appetite this morning and wanting a "big breakfast" and named the items he wants to eat.  Patient denied SI and HI.  He reports experiencing some AH of a flower in the corner and also seeing other objects.    Per Marvia Pickles, NP ;  Patient continues to meet inpatient criteria and geropsych placement will continue to be sought.

## 2018-03-16 NOTE — Discharge Instructions (Addendum)
Daily dressing changes  Information on my medicine - ELIQUIS (apixaban)  This medication education was reviewed with me or my healthcare representative as part of my discharge preparation.  The pharmacist that spoke with me during my hospital stay was:  Pricilla Larsson, [facility]  Why was Eliquis prescribed for you? Eliquis was prescribed for you to reduce the risk of a blood clot forming that can cause a stroke if you have a medical condition called atrial fibrillation (a type of irregular heartbeat).  What do You need to know about Eliquis ? Take your Eliquis TWICE DAILY - one tablet in the morning and one tablet in the evening with or without food. If you have difficulty swallowing the tablet whole please discuss with your pharmacist how to take the medication safely.  Take Eliquis exactly as prescribed by your doctor and DO NOT stop taking Eliquis without talking to the doctor who prescribed the medication.  Stopping may increase your risk of developing a stroke.  Refill your prescription before you run out.  After discharge, you should have regular check-up appointments with your healthcare provider that is prescribing your Eliquis.  In the future your dose may need to be changed if your kidney function or weight changes by a significant amount or as you get older.  What do you do if you miss a dose? If you miss a dose, take it as soon as you remember on the same day and resume taking twice daily.  Do not take more than one dose of ELIQUIS at the same time to make up a missed dose.  Important Safety Information A possible side effect of Eliquis is bleeding. You should call your healthcare provider right away if you experience any of the following: ? Bleeding from an injury or your nose that does not stop. ? Unusual colored urine (red or dark brown) or unusual colored stools (red or black). ? Unusual bruising for unknown reasons. ? A serious fall or if you hit your head (even if there  is no bleeding).  Some medicines may interact with Eliquis and might increase your risk of bleeding or clotting while on Eliquis. To help avoid this, consult your healthcare provider or pharmacist prior to using any new prescription or non-prescription medications, including herbals, vitamins, non-steroidal anti-inflammatory drugs (NSAIDs) and supplements.  This website has more information on Eliquis (apixaban): http://www.eliquis.com/eliquis/home

## 2018-03-16 NOTE — Progress Notes (Signed)
Patient meets criteria for inpatient treatment. There are currently no appropriate beds available at [facility] per [facility]. CSW faxed referrals to the following facilities for review.  [address], [place], [facility], [place], [place], [place], [place], [place], [place], [place], [place], and [place].   TTS will continue to seek bed placement.     Herbert Moors, MSW, LCSW-A, LCAS-A 03/16/2018 8:50 AM

## 2018-03-16 NOTE — ED Provider Notes (Signed)
[facility] EMERGENCY DEPARTMENT Provider Note   CSN: [id number] Arrival date & time: 03/16/18  1938     History   Chief Complaint Chief Complaint  Patient presents with  . Fall    HPI Don [name] is a 77 y.o. male.  HPI Patient was seen in the emergency department yesterday for confusion and hallucinations.  Had work-up including CT head.  Noted to have elevated INR.  Patient had been on Coumadin for atrial fibrillation.  Was being transferred to inpatient psychiatric facility in police custody.  Patient had a witnessed fall from standing landing on his back.  Officer does not believe the patient his head.  There was no loss of consciousness.  Patient sustained a prominent laceration to the right forearm and small skin tears to the left hand and elbow.  He is denying any neck or back pain.  Unknown last tetanus.  Moving all extremities without focal deficit. Past Medical History:  Diagnosis Date  . Acute bronchitis 04/03/2014  . Anxiety   . Atrial fibrillation ([place])   . Cataract   . COPD (chronic obstructive pulmonary disease) ([place])   . Essential hypertension   . Hyperlipidemia   . Macular degeneration   . Noncompliance with medications 04/2013   Xarelto, digoxin previously  . Pre-diabetes   . Prostate cancer ([place])   . Stage III chronic kidney disease 04/03/2014  . Tubular adenoma of colon 07/31/02, 11/18/03    Patient Active Problem List   Diagnosis Date Noted  . Mass of lower lobe of left lung 02/12/2018  . Depression, recurrent ([place]) 01/02/2018  . Anemia   . Normocytic anemia   . Coagulopathy ([place]) 01/31/2017  . Pancytopenia ([place]) 01/31/2017  . Respiratory distress   . Pre-diabetes 07/23/2015  . Metabolic syndrome 10/62/6948  . Urinary bladder incontinence 03/04/2015  . Paroxysmal atrial fibrillation ([place]) 05/25/2014  . Hyperlipidemia LDL goal <130 04/04/2014  . Personal history of colonic polyps 04/04/2014  . History of prostate cancer 04/03/2014  . Stage III  chronic kidney disease ([place]) 04/03/2014  . Tobacco abuse 04/25/2013  . Prostate cancer ([place]) 02/08/2011  . COPD (chronic obstructive pulmonary disease) ([place]) 02/08/2011  . Hypertension 02/08/2011    Past Surgical History:  Procedure Laterality Date  . Anal abcess,Hemorroids,    . COLONOSCOPY N/A 04/05/2014   Procedure: COLONOSCOPY;  Surgeon: Ladene Artist, MD;  Location: [facility] ENDOSCOPY;  Service: Endoscopy;  Laterality: N/A;  . COLONOSCOPY W/ BIOPSIES  11/18/2003   Dr. Earle Gell  . ESOPHAGOGASTRODUODENOSCOPY  11/18/2003   Dr. Earle Gell  . ESOPHAGOGASTRODUODENOSCOPY N/A 04/05/2014   Procedure: ESOPHAGOGASTRODUODENOSCOPY (EGD);  Surgeon: Ladene Artist, MD;  Location: [facility];  Service: Endoscopy;  Laterality: N/A;  . ESOPHAGOGASTRODUODENOSCOPY N/A 02/02/2017   Procedure: ESOPHAGOGASTRODUODENOSCOPY (EGD);  Surgeon: Danie Binder, MD;  Location: [facility] ENDO [facility]ITE;  Service: Endoscopy;  Laterality: N/A;  . RETROPUBIC PROSTATECTOMY  11/26/2001  . TONSILLECTOMY          Home Medications    Prior to Admission medications   Medication Sig Start Date End Date Taking? Authorizing Provider  atorvastatin (LIPITOR) 20 MG tablet TAKE 1 TABLET DAILY 01/24/18   Dettinger, Fransisca Kaufmann, MD  bisoprolol (ZEBETA) 10 MG tablet TAKE (1) TABLET TWICE A DAY. 02/27/18   Dettinger, Fransisca Kaufmann, MD  calcitRIOL (ROCALTROL) 0.25 MCG capsule Take 1 capsule (0.25 mcg total) by mouth daily. 02/12/18   Dettinger, Fransisca Kaufmann, MD  cephALEXin (KEFLEX) 500 MG capsule Take 1 capsule (500 mg total)  by mouth 3 (three) times daily. 03/16/18   Milton Ferguson, MD  clonazePAM (KLONOPIN) 1 MG tablet TAKE 1/2 TABLET ONCE DAILY AS NEEDED FOR ANXIETY 01/24/18   Dettinger, Fransisca Kaufmann, MD    Family History Family History  Problem Relation Age of Onset  . Diabetes Father   . Heart disease Father        MI  . Heart attack Father   . Heart attack Mother   . Heart disease Brother   . Cancer Brother        lung  . Lung cancer  Brother   . Heart disease Brother   . Lung cancer Sister   . Cancer Sister        lung  . Heart disease Brother   . Heart disease Brother     Social History Social History   Tobacco Use  . Smoking status: Current Some Day Smoker    Packs/day: 0.50    Years: 62.00    Pack years: 31.00    Types: Cigarettes, Cigars    Last attempt to quit: 12/28/2016    Years since quitting: 1.2  . Smokeless tobacco: Never Used  Substance Use Topics  . Alcohol use: No  . Drug use: No     Allergies   Wellbutrin [bupropion]   Review of Systems Review of Systems  Constitutional: Negative for chills and fever.  Eyes: Negative for visual disturbance.  Respiratory: Negative for shortness of breath.   Gastrointestinal: Negative for abdominal pain, nausea and vomiting.  Musculoskeletal: Negative for back pain and neck pain.  Skin: Positive for wound.  Neurological: Negative for dizziness, syncope, weakness, light-headedness, numbness and headaches.  Psychiatric/Behavioral: Positive for hallucinations. Negative for suicidal ideas.  All other systems reviewed and are negative.    Physical Exam Updated Vital Signs BP 136/76   Pulse (!) 158   Temp 99.6 F (37.6 C) (Oral)   Resp 19   Ht 5\' 9"  (1.753 m)   Wt 73.5 kg (162 lb)   SpO2 (!) 89%   BMI 23.92 kg/m   Physical Exam  Constitutional: He is oriented to person, place, and time. He appears well-developed and well-nourished. No distress.  HENT:  Head: Normocephalic and atraumatic.  Mouth/Throat: Oropharynx is clear and moist.  No evidence of any scalp trauma.  No intraoral trauma.  Eyes: Pupils are equal, round, and reactive to light. EOM are normal.  Neck: Normal range of motion. Neck supple.  No posterior midline cervical tenderness to palpation.  Cardiovascular: Normal rate and regular rhythm. Exam reveals no gallop and no friction rub.  No murmur heard. Pulmonary/Chest: Effort normal and breath sounds normal. No stridor. No  respiratory distress. He has no wheezes. He has no rales. He exhibits no tenderness.  Abdominal: Soft. Bowel sounds are normal. There is no tenderness. There is no rebound and no guarding.  Musculoskeletal: Normal range of motion. He exhibits no edema or tenderness.  No midline thoracic or lumbar tenderness.  Pelvis is stable.  Full range of motion of all joints.  Neurological: He is alert and oriented to person, place, and time.  5/5 motor in all extremities.  Sensation fully intact.  Skin: Skin is warm and dry. No rash noted. He is not diaphoretic. No erythema.  Patient has a large degloving injury to the ventral surface of the right forearm.  No obvious gross contamination.  Wound is slow bleeding.  Patient has mild skin tears to the ulnar surface of the left hand and  lateral elbow.  No obvious contamination.  Underlying joints with full range of motion and no deformity.  Distal pulses are 2+.  Nursing note and vitals reviewed.    ED Treatments / Results  Labs (all labs ordered are listed, but only abnormal results are displayed) Labs Reviewed  CBC WITH DIFFERENTIAL/PLATELET - Abnormal; Notable for the following components:      Result Value   WBC 11.8 (*)    RBC 3.22 (*)    Hemoglobin 10.2 (*)    HCT 30.7 (*)    RDW 16.2 (*)    Neutro Abs 9.5 (*)    All other components within normal limits  COMPREHENSIVE METABOLIC PANEL - Abnormal; Notable for the following components:   Glucose, Bld 122 (*)    BUN 41 (*)    Creatinine, Ser 3.30 (*)    Total Protein 6.0 (*)    Albumin 2.8 (*)    ALT 13 (*)    GFR calc non Af Amer 17 (*)    GFR calc Af Amer 19 (*)    All other components within normal limits  PROTIME-INR - Abnormal; Notable for the following components:   Prothrombin Time 52.4 (*)    INR 5.90 (*)    All other components within normal limits  CBC - Abnormal; Notable for the following components:   RBC 2.54 (*)    Hemoglobin 8.0 (*)    HCT 24.0 (*)    RDW 16.2 (*)    All  other components within normal limits  CBC WITH DIFFERENTIAL/PLATELET - Abnormal; Notable for the following components:   WBC 10.8 (*)    RBC 2.72 (*)    Hemoglobin 8.4 (*)    HCT 25.7 (*)    RDW 16.2 (*)    All other components within normal limits  PROTIME-INR - Abnormal; Notable for the following components:   Prothrombin Time 28.1 (*)    All other components within normal limits    EKG EKG Interpretation  Date/Time:  Sunday Mar 17 2018 04:21:58 EDT Ventricular Rate:  120 PR Interval:    QRS Duration: 96 QT Interval:  297 QTC Calculation: 420 R Axis:   74 Text Interpretation:  Atrial fibrillation Low voltage, extremity leads Minimal ST depression, diffuse leads Confirmed by Ripley Fraise [phone]) on 03/17/2018 4:43:01 AM   Radiology Dg Chest 2 View  Result Date: 03/15/2018 CLINICAL DATA:  Altered mental status EXAM: CHEST - 2 VIEW COMPARISON:  PET-CT dated 02/21/2018 FINDINGS: Retrocardiac/left lower lobe pulmonary nodule, corresponding to suspected primary bronchogenic neoplasm on PET-CT. No focal consolidation. Small right pleural effusion. No pneumothorax. The heart is normal in size. Degenerative changes of the visualized thoracolumbar spine. IMPRESSION: Retrocardiac/left lower lobe pulmonary nodule, corresponding to suspected primary bronchogenic neoplasm on PET-CT. Small right pleural effusion. Electronically Signed   By: Julian Hy M.D.   On: 03/15/2018 20:10   Dg Forearm Right  Result Date: 03/16/2018 CLINICAL DATA:  Skin tears to forearm. EXAM: RIGHT FOREARM - 2 VIEW COMPARISON:  None. FINDINGS: No fracture of the radius or ulna. The elbow joint and the wrist joint appear normal on two views. Laceration along the soft tissues of the forearm. IMPRESSION: No fracture or dislocation.  Soft tissue injury. Electronically Signed   By: Suzy Bouchard M.D.   On: 03/16/2018 21:44   Ct Head Wo Contrast  Result Date: 03/16/2018 CLINICAL DATA:  Altered mental status.  Fall  today. EXAM: CT HEAD WITHOUT CONTRAST TECHNIQUE: Contiguous axial images were obtained from  the base of the skull through the vertex without intravenous contrast. COMPARISON:  CT scan of Mar 15, 2018. FINDINGS: Brain: Mild diffuse cortical atrophy is noted. Mild chronic ischemic white matter disease is noted. No mass effect or midline shift is noted. Ventricular size is within normal limits. There is no evidence of mass lesion, hemorrhage or acute infarction. Vascular: No hyperdense vessel or unexpected calcification. Skull: Normal. Negative for fracture or focal lesion. Sinuses/Orbits: Left sphenoid sinusitis is noted. Other: None. IMPRESSION: Mild diffuse cortical atrophy. Mild chronic ischemic white matter disease. No acute intracranial abnormality seen. Electronically Signed   By: Marijo Conception, M.D.   On: 03/16/2018 21:39   Ct Head Wo Contrast  Result Date: 03/15/2018 CLINICAL DATA:  Altered mental status, hallucinations, suicidal/homicidal ideation EXAM: CT HEAD WITHOUT CONTRAST TECHNIQUE: Contiguous axial images were obtained from the base of the skull through the vertex without intravenous contrast. COMPARISON:  MRI brain dated 02/18/2018 FINDINGS: Brain: No evidence of acute infarction, hemorrhage, hydrocephalus, extra-axial collection or mass lesion/mass effect. Cortical atrophy. Subcortical white matter and periventricular small vessel ischemic changes. Vascular: No hyperdense vessel or unexpected calcification. Skull: Normal. Negative for fracture or focal lesion. Sinuses/Orbits: Partial opacification of the right maxillary sinus. Mastoid air cells are clear. Other: None. IMPRESSION: No evidence of acute intracranial abnormality. Atrophy with small vessel ischemic changes. Electronically Signed   By: Julian Hy M.D.   On: 03/15/2018 20:02    Procedures Procedures (including critical care time)  Medications Ordered in ED Medications  bisoprolol (ZEBETA) tablet 10 mg (10 mg Oral Given  03/17/18 1012)  atorvastatin (LIPITOR) tablet 20 mg (20 mg Oral Given 03/17/18 1012)  clonazePAM (KLONOPIN) tablet 1 mg (has no administration in time range)  Tdap (BOOSTRIX) injection 0.5 mL (0.5 mLs Intramuscular Given 03/16/18 2113)  morphine 2 MG/ML injection 2 mg (2 mg Intravenous Given 03/16/18 2113)  cephALEXin (KEFLEX) capsule 500 mg (500 mg Oral Given 03/16/18 2158)  THROMBI-PAD 3"X3" pad (2 each  Given 03/16/18 2213)  phytonadione (VITAMIN K) SQ injection 5 mg (5 mg Subcutaneous Given 03/16/18 2241)  acetaminophen (TYLENOL) tablet 650 mg (650 mg Oral Given 03/16/18 2238)     Initial Impression / Assessment and Plan / ED Course  I have reviewed the triage vital signs and the nursing notes.  Pertinent labs & imaging results that were available during my care of the patient were reviewed by me and considered in my medical decision making (see chart for details).     Unable to repair laceration to the right forearm due to friability of the overlying skin.  Dressing placed and bleeding controlled.  Given increased risk of infection will place on prophylactic antibiotics.  Patient was signed out to Dr. Roderic Palau pending work-up. Final Clinical Impressions(s) / ED Diagnoses   Final diagnoses:  Fall, initial encounter    ED Discharge Orders        Ordered    cephALEXin (KEFLEX) 500 MG capsule  3 times daily     03/16/18 2237       Julianne Rice, MD 03/17/18 [phone]

## 2018-03-16 NOTE — ED Notes (Signed)
CRITICAL VALUE ALERT  Critical Value:  INR 5.90  Date & Time Notied:  03/16/18 2206  Provider Notified: Roderic Palau  Orders Received/Actions taken: NA

## 2018-03-17 ENCOUNTER — Other Ambulatory Visit: Payer: Self-pay

## 2018-03-17 LAB — CBC
HEMATOCRIT: 24 % — AB (ref 39.0–52.0)
HEMOGLOBIN: 8 g/dL — AB (ref 13.0–17.0)
MCH: 31.5 pg (ref 26.0–34.0)
MCHC: 33.3 g/dL (ref 30.0–36.0)
MCV: 94.5 fL (ref 78.0–100.0)
PLATELETS: 216 10*3/uL (ref 150–400)
RBC: 2.54 MIL/uL — ABNORMAL LOW (ref 4.22–5.81)
RDW: 16.2 % — ABNORMAL HIGH (ref 11.5–15.5)
WBC: 9.7 10*3/uL (ref 4.0–10.5)

## 2018-03-17 LAB — CBC WITH DIFFERENTIAL/PLATELET
BASOS ABS: 0 10*3/uL (ref 0.0–0.1)
Basophils Relative: 0 %
Eosinophils Absolute: 0.1 10*3/uL (ref 0.0–0.7)
Eosinophils Relative: 1 %
HCT: 25.7 % — ABNORMAL LOW (ref 39.0–52.0)
HEMOGLOBIN: 8.4 g/dL — AB (ref 13.0–17.0)
LYMPHS ABS: 2.2 10*3/uL (ref 0.7–4.0)
Lymphocytes Relative: 20 %
MCH: 30.9 pg (ref 26.0–34.0)
MCHC: 32.7 g/dL (ref 30.0–36.0)
MCV: 94.5 fL (ref 78.0–100.0)
Monocytes Absolute: 0.8 10*3/uL (ref 0.1–1.0)
Monocytes Relative: 8 %
NEUTROS PCT: 71 %
Neutro Abs: 7.7 10*3/uL (ref 1.7–7.7)
Platelets: 246 10*3/uL (ref 150–400)
RBC: 2.72 MIL/uL — ABNORMAL LOW (ref 4.22–5.81)
RDW: 16.2 % — ABNORMAL HIGH (ref 11.5–15.5)
WBC: 10.8 10*3/uL — AB (ref 4.0–10.5)

## 2018-03-17 LAB — PROTIME-INR
INR: 2.66
PROTHROMBIN TIME: 28.1 s — AB (ref 11.4–15.2)

## 2018-03-17 MED ORDER — BISOPROLOL FUMARATE 5 MG PO TABS
10.0000 mg | ORAL_TABLET | Freq: Two times a day (BID) | ORAL | Status: DC
  Administered 2018-03-17 – 2018-03-18 (×3): 10 mg via ORAL
  Filled 2018-03-17 (×7): qty 2

## 2018-03-17 MED ORDER — CLONAZEPAM 0.5 MG PO TABS
1.0000 mg | ORAL_TABLET | Freq: Two times a day (BID) | ORAL | Status: DC | PRN
  Administered 2018-03-17: 1 mg via ORAL
  Filled 2018-03-17: qty 2

## 2018-03-17 MED ORDER — ATORVASTATIN CALCIUM 20 MG PO TABS
20.0000 mg | ORAL_TABLET | Freq: Every day | ORAL | Status: DC
  Administered 2018-03-17 – 2018-03-18 (×2): 20 mg via ORAL
  Filled 2018-03-17 (×2): qty 2

## 2018-03-17 NOTE — ED Notes (Signed)
Pt bandage on right arm changed for the 5th time. Pt right hand has slightly swollen.

## 2018-03-17 NOTE — Progress Notes (Signed)
Patient meets criteria for inpatient treatment. There are currently no appropriate beds available at [facility] per [facility]. CSW faxed referrals to the following facilities for review.  [address], [place], [place], [place], [place], [place], [place], [place], [place], [place], [place], and [place].   TTS will continue to seek bed placement.   Herbert Moors MSW, LCSW-A, LCAS-A Clinical Social Worker 03/17/2018 4:35 PM

## 2018-03-17 NOTE — ED Notes (Signed)
Towels under patient's right arm changed out

## 2018-03-17 NOTE — ED Notes (Signed)
Towels changed under arm

## 2018-03-17 NOTE — ED Notes (Signed)
Spoke with [facility] at this time concerning placement at [facility]. Bleeding controlled and INR WNL at this time. Per [facility] pt is no longer on their disposition list. Spoke with Surveyor, mining at [facility] adult geri psych unit. She states that pt cannot be accepted at this time d/t POA not wanting pt to be that far away, recent events, and pt's acuity. Per Adonis Huguenin it would be up to tx team to make the decision if they would accept him tomorrow. Pt no longer being accepted is not d/t bed availability. MD Lacinda Axon notified.

## 2018-03-17 NOTE — ED Notes (Signed)
Patient has not voided this shift, bladder scan volume 300cc, per EDP Zackowski, in and out catheter not needed at this time.

## 2018-03-17 NOTE — ED Notes (Signed)
Pt bandages loosened. Cap refill returned to normal. Bleeding stopped.  Pt reports his arm feeling more comfortable.

## 2018-03-17 NOTE — ED Notes (Signed)
Pt has declined the need to urinate since 11pm. Offered several times. Nurse made aware.

## 2018-03-17 NOTE — ED Provider Notes (Signed)
Patient awaiting placement.  He continues to bleed from wound. deep skin tear noted with bleeding.  We applied pressure and had him elevate his arm.  He is now having tachycardia, from his A. fib with RVR.   EKG Interpretation  Date/Time:  Sunday Mar 17 2018 04:21:58 EDT Ventricular Rate:  120 PR Interval:    QRS Duration: 96 QT Interval:  297 QTC Calculation: 420 R Axis:   74 Text Interpretation:  Atrial fibrillation Low voltage, extremity leads Minimal ST depression, diffuse leads Confirmed by Ripley Fraise [phone]) on 03/17/2018 4:43:01 AM      ordered home medications to get better control of his heart rate.  Patient otherwise stable   Ripley Fraise, MD 03/17/18 [phone]

## 2018-03-17 NOTE — ED Provider Notes (Signed)
Repeat labs at 14: 30 today reveal a hemoglobin 8.4 and INR of 2.66.   Nat Christen, MD 03/17/18 1459

## 2018-03-17 NOTE — ED Notes (Signed)
Second phone number for [name] ([place]) [phone].

## 2018-03-18 ENCOUNTER — Encounter (HOSPITAL_COMMUNITY): Payer: Self-pay

## 2018-03-18 ENCOUNTER — Ambulatory Visit (HOSPITAL_COMMUNITY): Payer: Medicare HMO | Admitting: Internal Medicine

## 2018-03-18 ENCOUNTER — Other Ambulatory Visit: Payer: Self-pay

## 2018-03-18 DIAGNOSIS — I4891 Unspecified atrial fibrillation: Secondary | ICD-10-CM | POA: Diagnosis not present

## 2018-03-18 DIAGNOSIS — E785 Hyperlipidemia, unspecified: Secondary | ICD-10-CM | POA: Diagnosis not present

## 2018-03-18 DIAGNOSIS — I10 Essential (primary) hypertension: Secondary | ICD-10-CM | POA: Diagnosis not present

## 2018-03-18 DIAGNOSIS — N184 Chronic kidney disease, stage 4 (severe): Secondary | ICD-10-CM | POA: Diagnosis present

## 2018-03-18 DIAGNOSIS — F17210 Nicotine dependence, cigarettes, uncomplicated: Secondary | ICD-10-CM | POA: Diagnosis present

## 2018-03-18 DIAGNOSIS — D649 Anemia, unspecified: Secondary | ICD-10-CM | POA: Diagnosis not present

## 2018-03-18 DIAGNOSIS — F329 Major depressive disorder, single episode, unspecified: Secondary | ICD-10-CM | POA: Diagnosis present

## 2018-03-18 DIAGNOSIS — R45851 Suicidal ideations: Secondary | ICD-10-CM | POA: Diagnosis present

## 2018-03-18 DIAGNOSIS — W19XXXD Unspecified fall, subsequent encounter: Secondary | ICD-10-CM | POA: Diagnosis not present

## 2018-03-18 DIAGNOSIS — N183 Chronic kidney disease, stage 3 (moderate): Secondary | ICD-10-CM | POA: Diagnosis not present

## 2018-03-18 DIAGNOSIS — F320 Major depressive disorder, single episode, mild: Secondary | ICD-10-CM | POA: Diagnosis not present

## 2018-03-18 DIAGNOSIS — I482 Chronic atrial fibrillation: Secondary | ICD-10-CM | POA: Diagnosis present

## 2018-03-18 DIAGNOSIS — R791 Abnormal coagulation profile: Secondary | ICD-10-CM | POA: Diagnosis not present

## 2018-03-18 DIAGNOSIS — H3530 Unspecified macular degeneration: Secondary | ICD-10-CM | POA: Diagnosis present

## 2018-03-18 DIAGNOSIS — J9601 Acute respiratory failure with hypoxia: Secondary | ICD-10-CM | POA: Diagnosis present

## 2018-03-18 DIAGNOSIS — Z515 Encounter for palliative care: Secondary | ICD-10-CM | POA: Diagnosis not present

## 2018-03-18 DIAGNOSIS — S51801A Unspecified open wound of right forearm, initial encounter: Secondary | ICD-10-CM | POA: Diagnosis not present

## 2018-03-18 DIAGNOSIS — S51801D Unspecified open wound of right forearm, subsequent encounter: Secondary | ICD-10-CM | POA: Diagnosis not present

## 2018-03-18 DIAGNOSIS — K625 Hemorrhage of anus and rectum: Secondary | ICD-10-CM | POA: Diagnosis present

## 2018-03-18 DIAGNOSIS — S51811A Laceration without foreign body of right forearm, initial encounter: Secondary | ICD-10-CM | POA: Diagnosis present

## 2018-03-18 DIAGNOSIS — F322 Major depressive disorder, single episode, severe without psychotic features: Secondary | ICD-10-CM | POA: Diagnosis not present

## 2018-03-18 DIAGNOSIS — R195 Other fecal abnormalities: Secondary | ICD-10-CM | POA: Diagnosis not present

## 2018-03-18 DIAGNOSIS — Z79899 Other long term (current) drug therapy: Secondary | ICD-10-CM | POA: Diagnosis not present

## 2018-03-18 DIAGNOSIS — I5032 Chronic diastolic (congestive) heart failure: Secondary | ICD-10-CM | POA: Diagnosis present

## 2018-03-18 DIAGNOSIS — W19XXXA Unspecified fall, initial encounter: Secondary | ICD-10-CM | POA: Diagnosis not present

## 2018-03-18 DIAGNOSIS — S61401A Unspecified open wound of right hand, initial encounter: Secondary | ICD-10-CM | POA: Diagnosis present

## 2018-03-18 DIAGNOSIS — D62 Acute posthemorrhagic anemia: Secondary | ICD-10-CM | POA: Diagnosis present

## 2018-03-18 DIAGNOSIS — I480 Paroxysmal atrial fibrillation: Secondary | ICD-10-CM | POA: Diagnosis present

## 2018-03-18 DIAGNOSIS — S61412A Laceration without foreign body of left hand, initial encounter: Secondary | ICD-10-CM | POA: Diagnosis present

## 2018-03-18 DIAGNOSIS — R5381 Other malaise: Secondary | ICD-10-CM | POA: Diagnosis not present

## 2018-03-18 DIAGNOSIS — Z23 Encounter for immunization: Secondary | ICD-10-CM | POA: Diagnosis present

## 2018-03-18 DIAGNOSIS — R531 Weakness: Secondary | ICD-10-CM | POA: Diagnosis present

## 2018-03-18 DIAGNOSIS — Z7189 Other specified counseling: Secondary | ICD-10-CM | POA: Diagnosis not present

## 2018-03-18 DIAGNOSIS — Z7901 Long term (current) use of anticoagulants: Secondary | ICD-10-CM | POA: Diagnosis not present

## 2018-03-18 DIAGNOSIS — J449 Chronic obstructive pulmonary disease, unspecified: Secondary | ICD-10-CM | POA: Diagnosis present

## 2018-03-18 DIAGNOSIS — K319 Disease of stomach and duodenum, unspecified: Secondary | ICD-10-CM | POA: Diagnosis present

## 2018-03-18 DIAGNOSIS — E782 Mixed hyperlipidemia: Secondary | ICD-10-CM | POA: Diagnosis present

## 2018-03-18 DIAGNOSIS — Z8249 Family history of ischemic heart disease and other diseases of the circulatory system: Secondary | ICD-10-CM | POA: Diagnosis not present

## 2018-03-18 DIAGNOSIS — K3189 Other diseases of stomach and duodenum: Secondary | ICD-10-CM | POA: Diagnosis not present

## 2018-03-18 DIAGNOSIS — Z833 Family history of diabetes mellitus: Secondary | ICD-10-CM | POA: Diagnosis not present

## 2018-03-18 DIAGNOSIS — I130 Hypertensive heart and chronic kidney disease with heart failure and stage 1 through stage 4 chronic kidney disease, or unspecified chronic kidney disease: Secondary | ICD-10-CM | POA: Diagnosis present

## 2018-03-18 DIAGNOSIS — W1830XA Fall on same level, unspecified, initial encounter: Secondary | ICD-10-CM | POA: Diagnosis present

## 2018-03-18 DIAGNOSIS — D500 Iron deficiency anemia secondary to blood loss (chronic): Secondary | ICD-10-CM | POA: Diagnosis not present

## 2018-03-18 DIAGNOSIS — Z8546 Personal history of malignant neoplasm of prostate: Secondary | ICD-10-CM | POA: Diagnosis not present

## 2018-03-18 LAB — POC OCCULT BLOOD, ED: Fecal Occult Bld: POSITIVE — AB

## 2018-03-18 LAB — COMPREHENSIVE METABOLIC PANEL
ALT: 12 U/L — ABNORMAL LOW (ref 17–63)
ANION GAP: 9 (ref 5–15)
AST: 15 U/L (ref 15–41)
Albumin: 2.3 g/dL — ABNORMAL LOW (ref 3.5–5.0)
Alkaline Phosphatase: 70 U/L (ref 38–126)
BILIRUBIN TOTAL: 1 mg/dL (ref 0.3–1.2)
BUN: 43 mg/dL — ABNORMAL HIGH (ref 6–20)
CO2: 22 mmol/L (ref 22–32)
Calcium: 8.1 mg/dL — ABNORMAL LOW (ref 8.9–10.3)
Chloride: 109 mmol/L (ref 101–111)
Creatinine, Ser: 3.32 mg/dL — ABNORMAL HIGH (ref 0.61–1.24)
GFR calc Af Amer: 19 mL/min — ABNORMAL LOW (ref >60)
GFR, EST NON AFRICAN AMERICAN: 16 mL/min — AB (ref >60)
Glucose, Bld: 92 mg/dL (ref 65–99)
POTASSIUM: 4.1 mmol/L (ref 3.5–5.1)
Sodium: 140 mmol/L (ref 135–145)
TOTAL PROTEIN: 4.8 g/dL — AB (ref 6.5–8.1)

## 2018-03-18 LAB — CBC WITH DIFFERENTIAL/PLATELET
Basophils Absolute: 0 10*3/uL (ref 0.0–0.1)
Basophils Relative: 0 %
Eosinophils Absolute: 0.2 10*3/uL (ref 0.0–0.7)
Eosinophils Relative: 3 %
HEMATOCRIT: 22.3 % — AB (ref 39.0–52.0)
Hemoglobin: 7.3 g/dL — ABNORMAL LOW (ref 13.0–17.0)
LYMPHS PCT: 13 %
Lymphs Abs: 1 10*3/uL (ref 0.7–4.0)
MCH: 30.9 pg (ref 26.0–34.0)
MCHC: 32.7 g/dL (ref 30.0–36.0)
MCV: 94.5 fL (ref 78.0–100.0)
MONO ABS: 0.8 10*3/uL (ref 0.1–1.0)
Monocytes Relative: 11 %
NEUTROS ABS: 5.6 10*3/uL (ref 1.7–7.7)
Neutrophils Relative %: 73 %
Platelets: 196 10*3/uL (ref 150–400)
RBC: 2.36 MIL/uL — ABNORMAL LOW (ref 4.22–5.81)
RDW: 16.2 % — AB (ref 11.5–15.5)
WBC: 7.7 10*3/uL (ref 4.0–10.5)

## 2018-03-18 LAB — RETICULOCYTES
RBC.: 2.33 MIL/uL — AB (ref 4.22–5.81)
RETIC CT PCT: 3.5 % — AB (ref 0.4–3.1)
Retic Count, Absolute: 81.6 10*3/uL (ref 19.0–186.0)

## 2018-03-18 LAB — TROPONIN I
Troponin I: 0.04 ng/mL (ref <0.03)
Troponin I: 0.03 ng/mL (ref <0.03)

## 2018-03-18 LAB — TSH: TSH: 3.173 u[IU]/mL (ref 0.350–4.500)

## 2018-03-18 LAB — PREPARE RBC (CROSSMATCH)

## 2018-03-18 MED ORDER — PANTOPRAZOLE SODIUM 40 MG PO TBEC
40.0000 mg | DELAYED_RELEASE_TABLET | Freq: Two times a day (BID) | ORAL | Status: DC
  Administered 2018-03-18 – 2018-03-22 (×7): 40 mg via ORAL
  Filled 2018-03-18 (×7): qty 1

## 2018-03-18 MED ORDER — ACETAMINOPHEN 325 MG PO TABS
650.0000 mg | ORAL_TABLET | Freq: Four times a day (QID) | ORAL | Status: DC | PRN
  Administered 2018-03-20 – 2018-03-24 (×5): 650 mg via ORAL
  Filled 2018-03-18 (×5): qty 2

## 2018-03-18 MED ORDER — ACETAMINOPHEN 650 MG RE SUPP: 650.0000 mg | Freq: Four times a day (QID) | RECTAL | Status: DC | PRN

## 2018-03-18 MED ORDER — SODIUM CHLORIDE 0.9 % IV SOLN
Freq: Once | INTRAVENOUS | Status: AC
  Administered 2018-03-18: 09:00:00 via INTRAVENOUS

## 2018-03-18 MED ORDER — ONDANSETRON HCL 4 MG/2ML IJ SOLN: 4.0000 mg | Freq: Four times a day (QID) | INTRAMUSCULAR | Status: DC | PRN

## 2018-03-18 MED ORDER — SODIUM CHLORIDE 0.9 % IV BOLUS (SEPSIS)
500.0000 mL | Freq: Once | INTRAVENOUS | Status: AC
  Administered 2018-03-18: 500 mL via INTRAVENOUS

## 2018-03-18 MED ORDER — DILTIAZEM HCL ER COATED BEADS 180 MG PO CP24
180.0000 mg | ORAL_CAPSULE | Freq: Every day | ORAL | Status: DC
  Administered 2018-03-18 – 2018-03-21 (×4): 180 mg via ORAL
  Filled 2018-03-18 (×7): qty 1

## 2018-03-18 MED ORDER — SODIUM CHLORIDE 0.9 % IV SOLN: Freq: Once | INTRAVENOUS | Status: AC

## 2018-03-18 MED ORDER — SENNOSIDES-DOCUSATE SODIUM 8.6-50 MG PO TABS: 1.0000 | ORAL_TABLET | Freq: Every evening | ORAL | Status: DC | PRN

## 2018-03-18 MED ORDER — CALCITRIOL 0.25 MCG PO CAPS
0.2500 ug | ORAL_CAPSULE | Freq: Every day | ORAL | Status: DC
  Administered 2018-03-18 – 2018-03-29 (×12): 0.25 ug via ORAL
  Filled 2018-03-18 (×12): qty 1

## 2018-03-18 MED ORDER — LORAZEPAM 0.5 MG PO TABS
0.5000 mg | ORAL_TABLET | Freq: Every day | ORAL | Status: DC
  Administered 2018-03-18 – 2018-03-28 (×11): 0.5 mg via ORAL
  Filled 2018-03-18 (×11): qty 1

## 2018-03-18 MED ORDER — SODIUM CHLORIDE 0.9 % IV SOLN
INTRAVENOUS | Status: DC
  Administered 2018-03-18 – 2018-03-22 (×6): via INTRAVENOUS

## 2018-03-18 MED ORDER — ONDANSETRON HCL 4 MG PO TABS: 4.0000 mg | ORAL_TABLET | Freq: Four times a day (QID) | ORAL | Status: DC | PRN

## 2018-03-18 MED ORDER — TERAZOSIN HCL 1 MG PO CAPS
2.0000 mg | ORAL_CAPSULE | Freq: Every day | ORAL | Status: DC
  Administered 2018-03-18 – 2018-03-28 (×11): 2 mg via ORAL
  Filled 2018-03-18 (×11): qty 2

## 2018-03-18 NOTE — ED Notes (Signed)
Date and time results received: 03/18/18 9:39 AM  (use smartphrase ".now" to insert current time)  Test: Troponin Critical Value: 0.04  Name of Provider Notified: Lacinda Axon  Orders Received? Or Actions Taken?: Orders Received - See Orders for details

## 2018-03-18 NOTE — ED Notes (Signed)
Hospitalist at bedside at this time 

## 2018-03-18 NOTE — Progress Notes (Signed)
Pt. meets criteria for inpatient treatment.  Referred out to the following hospitals: [facility]  [facility]        Disposition CSW will continue to follow for placement.  Areatha Keas. Judi Cong, MSW, [place] Disposition Clinical Social Work [phone] (cell) [phone] (office)

## 2018-03-18 NOTE — ED Notes (Signed)
EDP in to evaluate pt this am due to increased HR.

## 2018-03-18 NOTE — H&P (Addendum)
History and Physical    Don [name] HWE:[id number] DOB: 04/27/1941 DOA: 03/16/2018  Referring MD/NP/PA: Nat Christen, EDP PCP: Dettinger, Fransisca Kaufmann, MD  Patient coming from: Emergency department  Chief Complaint: Fast heart rate, pale  HPI: Don [name] is a 77 y.o. male with history significant for atrial fibrillation, hypertension, hyperlipidemia, stage III chronic kidney disease, history of prostate cancer who initially presented to the [facility] emergency department on 5/4 with complaints of feeling depressed and suicidal with an actual suicide plan.  He was evaluated by TTS and it was decided that he needed inpatient psychiatric care.  He has been in the emergency department the past 2-1/2 days awaiting psychiatric placement.  Unfortunately it appears that he did not receive any fluids, his home medications were not entirely restarted and he has not been eating or drinking well since presentation.  When EDP today went to assess patient, patient was noticed to have a heart rate anywhere from 1 30-1 50, looked very pale.  Labs were drawn which showed a hemoglobin of 7.3, was 10.2 upon presentation on the fourth, he did perform a rectal exam, stool was brown in color and was only faintly positive for blood.  On blood work his creatinine is 3.32 which is around his baseline, he has stage IV chronic kidney disease, otherwise labs are within normal limits.  EKG confirms A. fib with RVR.  EDP ordered a 500 cc saline bolus and has started fluids at 150 cc an hour.  By the time I see him he has already received about a liter of fluids and his heart rate is somewhat improved to the 120s, but he does state that he is feeling weak and a little short of breath with minimal exertion.  I am asked to admit him for further evaluation and management.   Past Medical/Surgical History: Past Medical History:  Diagnosis Date  . Acute bronchitis 04/03/2014  . Anxiety   . Atrial fibrillation ([place])   . Cataract   .  COPD (chronic obstructive pulmonary disease) ([place])   . Essential hypertension   . Hyperlipidemia   . Macular degeneration   . Noncompliance with medications 04/2013   Xarelto, digoxin previously  . Pre-diabetes   . Prostate cancer ([place])   . Stage III chronic kidney disease 04/03/2014  . Tubular adenoma of colon 07/31/02, 11/18/03    Past Surgical History:  Procedure Laterality Date  . Anal abcess,Hemorroids,    . COLONOSCOPY N/A 04/05/2014   Procedure: COLONOSCOPY;  Surgeon: Ladene Artist, MD;  Location: [facility] ENDOSCOPY;  Service: Endoscopy;  Laterality: N/A;  . COLONOSCOPY W/ BIOPSIES  11/18/2003   Dr. Earle Gell  . ESOPHAGOGASTRODUODENOSCOPY  11/18/2003   Dr. Earle Gell  . ESOPHAGOGASTRODUODENOSCOPY N/A 04/05/2014   Procedure: ESOPHAGOGASTRODUODENOSCOPY (EGD);  Surgeon: Ladene Artist, MD;  Location: [facility];  Service: Endoscopy;  Laterality: N/A;  . ESOPHAGOGASTRODUODENOSCOPY N/A 02/02/2017   Procedure: ESOPHAGOGASTRODUODENOSCOPY (EGD);  Surgeon: Danie Binder, MD;  Location: [facility]O SU[facility];  Service: Endoscopy;  Laterality: N/A;  . RETROPUBIC PROSTATECTOMY  11/26/2001  . TONSILLECTOMY      Social History:  reports that he has been smoking cigarettes and cigars.  He has a 31.00 pack-year smoking history. He has never used smokeless tobacco. He reports that he does not drink alcohol or use drugs.  Allergies: Allergies  Allergen Reactions  . Wellbutrin [Bupropion] Anxiety    Family History:  Family History  Problem Relation Age of Onset  . Diabetes Father   .  Heart disease Father        MI  . Heart attack Father   . Heart attack Mother   . Heart disease Brother   . Cancer Brother        lung  . Lung cancer Brother   . Heart disease Brother   . Lung cancer Sister   . Cancer Sister        lung  . Heart disease Brother   . Heart disease Brother     Prior to Admission medications   Medication Sig Start Date End Date Taking? Authorizing Provider    atorvastatin (LIPITOR) 20 MG tablet TAKE 1 TABLET DAILY 01/24/18  Yes Dettinger, Fransisca Kaufmann, MD  bisoprolol (ZEBETA) 10 MG tablet TAKE (1) TABLET TWICE A DAY. 02/27/18  Yes Dettinger, Fransisca Kaufmann, MD  calcitRIOL (ROCALTROL) 0.25 MCG capsule Take 1 capsule (0.25 mcg total) by mouth daily. 02/12/18  Yes Dettinger, Fransisca Kaufmann, MD  clonazePAM (KLONOPIN) 1 MG tablet TAKE 1/2 TABLET ONCE DAILY AS NEEDED FOR ANXIETY 01/24/18  Yes Dettinger, Fransisca Kaufmann, MD  diltiazem (DILACOR XR) 180 MG 24 hr capsule Take 180 mg by mouth 2 (two) times daily.   Yes [provider]  iron polysaccharides (NIFEREX) 150 MG capsule Take 150 mg by mouth daily.   Yes [provider]  loratadine (CLARITIN) 10 MG tablet Take 10 mg by mouth daily.   Yes [provider]  pantoprazole (PROTONIX) 40 MG tablet Take 40 mg by mouth 2 (two) times daily.   Yes [provider]  terazosin (HYTRIN) 2 MG capsule Take 2 mg by mouth at bedtime.   Yes [provider]  cephALEXin (KEFLEX) 500 MG capsule Take 1 capsule (500 mg total) by mouth 3 (three) times daily. 03/16/18   Milton Ferguson, MD    Review of Systems:, Feels very weak. Constitutional: Denies fever, chills, diaphoresis, appetite change and fatigue.  HEENT: Denies photophobia, eye pain, redness, hearing loss, ear pain, congestion, sore throat, rhinorrhea, sneezing, mouth sores, trouble swallowing, neck pain, neck stiffness and tinnitus.   Respiratory: Denies SOB, DOE, cough, chest tightness,  and wheezing.   Cardiovascular: Denies chest pain, palpitations and leg swelling.  Gastrointestinal: Denies nausea, vomiting, abdominal pain, diarrhea, constipation, blood in stool and abdominal distention.  Genitourinary: Denies dysuria, urgency, frequency, hematuria, flank pain and difficulty urinating.  Endocrine: Denies: hot or cold intolerance, sweats, changes in hair or nails, polyuria, polydipsia. Musculoskeletal: Denies myalgias, back pain, joint swelling,  arthralgias and gait problem.  Skin: Denies pallor, rash and wound.  Neurological: Denies dizziness, seizures, syncope, weakness, light-headedness, numbness and headaches.  Hematological: Denies adenopathy. Easy bruising, personal or family bleeding history  Psychiatric/Behavioral: Denies suicidal ideation, mood changes, confusion, nervousness, sleep disturbance and agitation    Physical Exam: Vitals:   03/18/18 1256 03/18/18 1300 03/18/18 1400 03/18/18 1450  BP: 130/90 123/78 131/88 131/88  Pulse: (!) 124 (!) 121 96 (!) 120  Resp: 16 18 20 20   Temp:      TempSrc:      SpO2: 95% 96% 97% 94%  Weight:      Height:         Constitutional: NAD, calm, comfortable pale skin, conjunctiva Eyes: PERRL, lids and conjunctivae pale ENMT: Mucous membranes are very dry. Posterior pharynx clear of any exudate or lesions.poor dentition.  Neck: normal, supple, no masses, no thyromegaly Respiratory: clear to auscultation bilaterally, no wheezing, no crackles. Normal respiratory effort. No accessory muscle use.  Cardiovascular: Tachycardic, irregular, no murmurs, rubs  or gallops Abdomen: no tenderness, no masses palpated. No hepatosplenomegaly. Bowel sounds positive.  Musculoskeletal: no clubbing / cyanosis. No joint deformity upper and lower extremities. Good ROM, no contractures. Normal muscle tone.  Skin: no rashes, lesions, ulcers. No induration Neurologic: CN 2-12 grossly intact. Sensation intact, DTR normal. Strength 5/5 in all 4.  Psychiatric: Flat affect, depressed mood   Labs on Admission: I have personally reviewed the following labs and imaging studies  CBC: Recent Labs  Lab 03/15/18 1902 03/16/18 2115 03/17/18 0658 03/17/18 1410 03/18/18 0843  WBC 7.4 11.8* 9.7 10.8* 7.7  NEUTROABS 5.4 9.5*  --  7.7 5.6  HGB 9.5* 10.2* 8.0* 8.4* 7.3*  HCT 28.8* 30.7* 24.0* 25.7* 22.3*  MCV 95.7 95.3 94.5 94.5 94.5  PLT 222 249 216 246 010   Basic Metabolic Panel: Recent Labs  Lab  03/15/18 1902 03/16/18 2115 03/18/18 0843  NA 142 144 140  K 4.4 4.7 4.1  CL 109 111 109  CO2 22 25 22   GLUCOSE 98 122* 92  BUN 41* 41* 43*  CREATININE 3.57* 3.30* 3.32*  CALCIUM 8.7* 8.9 8.1*   GFR: Estimated Creatinine Clearance: 18.3 mL/min (A) (by C-G formula based on SCr of 3.32 mg/dL (H)). Liver Function Tests: Recent Labs  Lab 03/15/18 1902 03/16/18 2115 03/18/18 0843  AST 20 17 15   ALT 12* 13* 12*  ALKPHOS 98 99 70  BILITOT 0.9 1.1 1.0  PROT 6.1* 6.0* 4.8*  ALBUMIN 2.9* 2.8* 2.3*   No results for input(s): LIPASE, AMYLASE in the last 168 hours. No results for input(s): AMMONIA in the last 168 hours. Coagulation Profile: Recent Labs  Lab 03/15/18 1902 03/16/18 2115 03/17/18 1410  INR 6.48* 5.90* 2.66   Cardiac Enzymes: Recent Labs  Lab 03/15/18 1902 03/18/18 0843  CKTOTAL 37*  --   TROPONINI  --  0.04*   BNP (last 3 results) No results for input(s): PROBNP in the last 8760 hours. HbA1C: No results for input(s): HGBA1C in the last 72 hours. CBG: Recent Labs  Lab 03/15/18 1916  GLUCAP 103*   Lipid Profile: No results for input(s): CHOL, HDL, LDLCALC, TRIG, CHOLHDL, LDLDIRECT in the last 72 hours. Thyroid Function Tests: No results for input(s): TSH, T4TOTAL, FREET4, T3FREE, THYROIDAB in the last 72 hours. Anemia Panel: No results for input(s): VITAMINB12, FOLATE, FERRITIN, TIBC, IRON, RETICCTPCT in the last 72 hours. Urine analysis:    Component Value Date/Time   COLORURINE YELLOW 03/15/2018 2110   APPEARANCEUR HAZY (A) 03/15/2018 2110   LABSPEC 1.013 03/15/2018 2110   PHURINE 5.0 03/15/2018 2110   GLUCOSEU NEGATIVE 03/15/2018 2110   HGBUR NEGATIVE 03/15/2018 2110   BILIRUBINUR NEGATIVE 03/15/2018 2110   BILIRUBINUR neg 10/07/2014 1122   KETONESUR NEGATIVE 03/15/2018 2110   PROTEINUR 100 (A) 03/15/2018 2110   UROBILINOGEN negative 10/07/2014 1122   UROBILINOGEN 0.2 04/03/2014 2308   NITRITE NEGATIVE 03/15/2018 2110   LEUKOCYTESUR  NEGATIVE 03/15/2018 2110   Sepsis Labs: @LABRCNTIP (procalcitonin:4,lacticidven:4) )No results found for this or any previous visit (from the past 240 hour(s)).   Radiological Exams on Admission: Dg Forearm Right  Result Date: 03/16/2018 CLINICAL DATA:  Skin tears to forearm. EXAM: RIGHT FOREARM - 2 VIEW COMPARISON:  None. FINDINGS: No fracture of the radius or ulna. The elbow joint and the wrist joint appear normal on two views. Laceration along the soft tissues of the forearm. IMPRESSION: No fracture or dislocation.  Soft tissue injury. Electronically Signed   By: Suzy Bouchard M.D.   On:  03/16/2018 21:44   Ct Head Wo Contrast  Result Date: 03/16/2018 CLINICAL DATA:  Altered mental status.  Fall today. EXAM: CT HEAD WITHOUT CONTRAST TECHNIQUE: Contiguous axial images were obtained from the base of the skull through the vertex without intravenous contrast. COMPARISON:  CT scan of Mar 15, 2018. FINDINGS: Brain: Mild diffuse cortical atrophy is noted. Mild chronic ischemic white matter disease is noted. No mass effect or midline shift is noted. Ventricular size is within normal limits. There is no evidence of mass lesion, hemorrhage or acute infarction. Vascular: No hyperdense vessel or unexpected calcification. Skull: Normal. Negative for fracture or focal lesion. Sinuses/Orbits: Left sphenoid sinusitis is noted. Other: None. IMPRESSION: Mild diffuse cortical atrophy. Mild chronic ischemic white matter disease. No acute intracranial abnormality seen. Electronically Signed   By: Marijo Conception, M.D.   On: 03/16/2018 21:39    EKG: Independently reviewed.  Atrial fibrillation with rapid ventricular response at a rate of 130  Assessment/Plan Principal Problem:   Atrial fibrillation with RVR (HCC) Active Problems:   Suicidal ideation   Hypertension   Stage III chronic kidney disease ([facility])   Hyperlipidemia LDL goal <130   Normocytic anemia   Major depression    A. fib with RVR -Suspect rapid  rates secondary to a combination of decreased circulating vascular volume as well as not receiving his diltiazem. -Rates are improved after receiving about a liter of saline. -We will continue normal saline at 75 cc an hour. -His bisoprolol had already been resumed by ED, will also resume his diltiazem 180 mg daily. -He is currently not anticoagulated, from what I can gather from the chart he used to be on Xarelto however was taken off of it due to noncompliance. Interestingly, his INR was >5 upon presentation on 5/4 and he was given vit K. INR has now normalized.  Major depression with suicidal ideation -With an active suicide plan. -He will remain IVC'd and awaiting psychiatric placement. -We will have psychiatry reassess him tomorrow.  Stage III-IV chronic kidney disease -At baseline creatinine is between 2.3 and 2.5.  He is at baseline. -Not sure if he has seen nephrology, but he needs to see them as soon as possible as he is very close to needing dialysis.  No acute dialysis indications today.  Normocytic anemia -Suspect related to chronic kidney disease. -Less likely acute blood loss anemia given findings on rectal exam despite positive FOBT. -Check anemia panel, type and screen, hold 2 units of PRBCs with plan to transfuse if hemoglobin were to drop below 7.  Hyperlipidemia -Continue statin.   DVT prophylaxis: Lovenox Code Status: Full code Family Communication: Patient only Disposition Plan: Admit for medical stability pending psychiatric placement Consults called: Psychiatry Admission status: Admit - It is my clinical opinion that admission to INPATIENT is reasonable and necessary because of the expectation that this patient will require hospital care that crosses at least 2 midnights to treat this condition based on the medical complexity of the problems presented.  Given the aforementioned information, the predictability of an adverse outcome is felt to be significant.       Time Spent: 95 minutes  Amiel Mccaffrey Isaac Bliss MD [facility] Pager [phone]  If 7PM-7AM, please contact night-coverage [email or web address] Password [email or web address]  03/18/2018, 3:14 PM

## 2018-03-18 NOTE — BH Assessment (Addendum)
[facility] Assessment Progress Note  Pt reassessed today. Pt continues to deny SI today. Regarding the visual hallucinations, pt reports that he "sometimes, but not lately" experiences them. Pt reports that he last experienced them "a couple of days ago". Pt presented as logical and oriented. Pt reports that he lives alone and feels safe to go back home.  Case staffed with Elmarie Shiley, [place]. Due to concerning collateral information from pt's brother at initial assessment, pt continues to be recommended for geropsych treatment.   Kenna Gilbert. Lovena Le, [place], [place], [facility] Counselor

## 2018-03-19 ENCOUNTER — Encounter (HOSPITAL_COMMUNITY): Payer: Self-pay | Admitting: Gastroenterology

## 2018-03-19 ENCOUNTER — Other Ambulatory Visit: Payer: Self-pay | Admitting: Family Medicine

## 2018-03-19 ENCOUNTER — Inpatient Hospital Stay (HOSPITAL_COMMUNITY): Payer: Medicare HMO

## 2018-03-19 DIAGNOSIS — R45851 Suicidal ideations: Secondary | ICD-10-CM

## 2018-03-19 DIAGNOSIS — D649 Anemia, unspecified: Secondary | ICD-10-CM

## 2018-03-19 LAB — FERRITIN: Ferritin: 126 ng/mL (ref 24–336)

## 2018-03-19 LAB — BASIC METABOLIC PANEL
ANION GAP: 7 (ref 5–15)
BUN: 44 mg/dL — AB (ref 6–20)
CO2: 22 mmol/L (ref 22–32)
Calcium: 7.7 mg/dL — ABNORMAL LOW (ref 8.9–10.3)
Chloride: 111 mmol/L (ref 101–111)
Creatinine, Ser: 3.37 mg/dL — ABNORMAL HIGH (ref 0.61–1.24)
GFR calc Af Amer: 19 mL/min — ABNORMAL LOW (ref >60)
GFR, EST NON AFRICAN AMERICAN: 16 mL/min — AB (ref >60)
GLUCOSE: 99 mg/dL (ref 65–99)
Potassium: 4.2 mmol/L (ref 3.5–5.1)
Sodium: 140 mmol/L (ref 135–145)

## 2018-03-19 LAB — CBC
HCT: 17.7 % — ABNORMAL LOW (ref 39.0–52.0)
Hemoglobin: 5.6 g/dL — CL (ref 13.0–17.0)
MCH: 30.9 pg (ref 26.0–34.0)
MCHC: 31.6 g/dL (ref 30.0–36.0)
MCV: 97.8 fL (ref 78.0–100.0)
Platelets: 166 10*3/uL (ref 150–400)
RBC: 1.81 MIL/uL — ABNORMAL LOW (ref 4.22–5.81)
RDW: 15.7 % — AB (ref 11.5–15.5)
WBC: 6.6 10*3/uL (ref 4.0–10.5)

## 2018-03-19 LAB — FOLATE: Folate: 39 ng/mL (ref >5.9)

## 2018-03-19 LAB — PROTIME-INR
INR: 1.3
Prothrombin Time: 16.1 seconds — ABNORMAL HIGH (ref 11.4–15.2)

## 2018-03-19 LAB — IRON AND TIBC
Iron: 39 ug/dL — ABNORMAL LOW (ref 45–182)
SATURATION RATIOS: 20 % (ref 17.9–39.5)
TIBC: 192 ug/dL — ABNORMAL LOW (ref 250–450)
UIBC: 153 ug/dL

## 2018-03-19 LAB — VITAMIN B12: VITAMIN B 12: 419 pg/mL (ref 180–914)

## 2018-03-19 LAB — TROPONIN I
TROPONIN I: 0.03 ng/mL — AB (ref <0.03)
Troponin I: 0.03 ng/mL (ref <0.03)

## 2018-03-19 MED ORDER — BISOPROLOL FUMARATE 5 MG PO TABS
10.0000 mg | ORAL_TABLET | Freq: Every day | ORAL | Status: DC
  Administered 2018-03-19 – 2018-03-29 (×11): 10 mg via ORAL
  Filled 2018-03-19 (×12): qty 2

## 2018-03-19 MED ORDER — SODIUM CHLORIDE 0.9 % IV SOLN
Freq: Once | INTRAVENOUS | Status: AC
  Administered 2018-03-19: 08:00:00 via INTRAVENOUS

## 2018-03-19 NOTE — Progress Notes (Signed)
Dressing to both arms changed and wounds cleansed. Hand on right side +2 edema. Coband removed and new dressings with abd pads and kerlex applied. Ensured Kerlex was placed and not too tight. Continue to monitor swelling of right hand and ensure dressing to right arm Is not tight.

## 2018-03-19 NOTE — Progress Notes (Signed)
CRITICAL VALUE ALERT  Critical Value:  Hemoglobin 5.6  Date & Time Notied: 03/19/2018  0705  Provider Notified: Jerilee Hoh  Orders Received/Actions taken: no orders at this time

## 2018-03-19 NOTE — Consult Note (Signed)
Referring Provider: Dr. Jerilee Hoh  Primary Care Physician:  Dettinger, Fransisca Kaufmann, MD Primary Gastroenterologist:  Dr.Gessner   Date of Admission: 03/18/18 (was in ED since 5/3 awaiting psychiatric placement)  Date of Consultation: 03/19/18  Reason for Consultation:  Acute on chronic anemia, heme positive stool.   HPI:  Don [name] is a 77 y.o. year old male with multiple medical issues who had initially presented to [facility] with depression, suicidal ideation, and suicide plan on 03/17/18. He was awaiting inpatient psychiatric care placement for past several days. Per ED notes, he had been confused over the past 48 hours prior to admission and called police with hallucinations, thoughts of harming himself. He was found lying outside in a ditch. He was being transferred to an inpatient psych facility while in police custody, when he had a witnessed fall from standing and landed on his back, no loss of consciousness, and he had a prominent laceration to right forearm with degloving. He bled from right forearm wound and required pressure with elevation of forearm. He was found to be tachycardic, Hgb 7 range (previously in 10 range on 03/16/18), brown stool but heme positive.  INR was greater than 6 several days ago. 03/17/18 down to 2.66. Ferritin normal in the 120s, iron low at 39. Today, Hgb is down to 5.6. GI consult has been requested. 2 units PRBCs have been requested.   Last seen by [facility] while inpatient March 2018 and underwent EGD due to acute drop in Hgb. Findings of LA Grade B esophagitis, one moderate benign-appearing intrinsic stenosis traversed, small non-bleeding diverticulum in second portion of duodenum, gastritis (reactive gastropathy, no H.pylori).  Established with [facility]. Patient was hospitalized in 2015 and during that hospitalization he had heme positive stool. He had an EGD and colonoscopy which revealed variable Z line, biopsy negative for Barrett's more consistent with reflux, small hiatal  hernia, 6 mm sessile polyp removed from the sigmoid colon (tubular adenoma) internal hemorrhoids.  While talking to patient today, he was drowsy and O2 sats dropped to high 80s. Placed on 2 liters [place] with RN at bedside. Improved to high 90s with supplemental oxygen. He is a poor historian and unable to tell me any specific details. He believes he is in [place]. Denies dysphagia, abdominal pain, constipation, diarrhea. No overt GI bleeding per nursing staff. Currently receiving blood transfusion.   Also of note, he has recently seen Oncology due to new finding of left lower lobe lung mass with mediastinal adenopathy. 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 His follow-up oncology appt was 03/18/18 but he was inpatient.    Past Medical History:  Diagnosis Date  . Acute bronchitis 04/03/2014  . Anxiety   . Atrial fibrillation ([place])   . Cataract   . COPD (chronic obstructive pulmonary disease) ([place])   . Essential hypertension   . Hyperlipidemia   . Macular degeneration   . Noncompliance with medications 04/2013   Xarelto, digoxin previously  . Pre-diabetes   . Prostate cancer ([place])   . Stage III chronic kidney disease 04/03/2014  . Tubular adenoma of colon 07/31/02, 11/18/03    Past Surgical History:  Procedure Laterality Date  . Anal abcess,Hemorroids,    . COLONOSCOPY N/A 04/05/2014   Dr. Fuller Plan: 6 mm sessile polyp from sigmoid colon (tubular adenoma), internal hemorrhoids  . COLONOSCOPY W/ BIOPSIES  11/18/2003   Dr. Earle Gell  . ESOPHAGOGASTRODUODENOSCOPY  11/18/2003  Dr. Earle Gell  . ESOPHAGOGASTRODUODENOSCOPY N/A 04/05/2014   Dr. Fuller Plan: variable Z line, negative Barrett's, small hiatal hernia  . ESOPHAGOGASTRODUODENOSCOPY N/A 02/02/2017   Dr. Oneida Alar: LA Grade B esophagitis, one moderate benign-appearing intrinsic stenosis traversed, small non-bleeding diverticulum in  second portion of duodenum, reactive gastropathy, no H.pylori.  [name] RETROPUBIC PROSTATECTOMY  11/26/2001  . TONSILLECTOMY      Prior to Admission medications   Medication Sig Start Date End Date Taking? Authorizing Provider  atorvastatin (LIPITOR) 20 MG tablet TAKE 1 TABLET DAILY 01/24/18  Yes Dettinger, Fransisca Kaufmann, MD  bisoprolol (ZEBETA) 10 MG tablet TAKE (1) TABLET TWICE A DAY. 02/27/18  Yes Dettinger, Fransisca Kaufmann, MD  calcitRIOL (ROCALTROL) 0.25 MCG capsule Take 1 capsule (0.25 mcg total) by mouth daily. 02/12/18  Yes Dettinger, Fransisca Kaufmann, MD  clonazePAM (KLONOPIN) 1 MG tablet TAKE 1/2 TABLET ONCE DAILY AS NEEDED FOR ANXIETY 01/24/18  Yes Dettinger, Fransisca Kaufmann, MD  diltiazem (DILACOR XR) 180 MG 24 hr capsule Take 180 mg by mouth 2 (two) times daily.   Yes [provider]  iron polysaccharides (NIFEREX) 150 MG capsule Take 150 mg by mouth daily.   Yes [provider]  loratadine (CLARITIN) 10 MG tablet Take 10 mg by mouth daily.   Yes [provider]  pantoprazole (PROTONIX) 40 MG tablet Take 40 mg by mouth 2 (two) times daily.   Yes [provider]  terazosin (HYTRIN) 2 MG capsule Take 2 mg by mouth at bedtime.   Yes [provider]  cephALEXin (KEFLEX) 500 MG capsule Take 1 capsule (500 mg total) by mouth 3 (three) times daily. 03/16/18   Milton Ferguson, MD    Current Facility-Administered Medications  Medication Dose Route Frequency Provider Last Rate Last Dose  . 0.9 %  sodium chloride infusion   Intravenous Once Isaac Bliss, Rayford Halsted, MD      . 0.9 %  sodium chloride infusion   Intravenous Continuous Isaac Bliss, Rayford Halsted, MD 75 mL/hr at 03/18/18 1932    . acetaminophen (TYLENOL) tablet 650 mg  650 mg Oral Q6H PRN Isaac Bliss, Rayford Halsted, MD       Or  . acetaminophen (TYLENOL) suppository 650 mg  650 mg Rectal Q6H PRN Isaac Bliss, Rayford Halsted, MD      . bisoprolol (ZEBETA) tablet 10 mg  10 mg Oral Daily Oswald Hillock, MD   10 mg at 03/19/18  0249  . calcitRIOL (ROCALTROL) capsule 0.25 mcg  0.25 mcg Oral Daily Isaac Bliss, Rayford Halsted, MD   0.25 mcg at 03/18/18 2114  . diltiazem (CARDIZEM CD) 24 hr capsule 180 mg  180 mg Oral Daily Isaac Bliss, Rayford Halsted, MD   180 mg at 03/18/18 2115  . LORazepam (ATIVAN) tablet 0.5 mg  0.5 mg Oral QHS Nita Sells, MD   0.5 mg at 03/18/18 2115  . ondansetron (ZOFRAN) tablet 4 mg  4 mg Oral Q6H PRN Isaac Bliss, Rayford Halsted, MD       Or  . ondansetron [facility]) injection 4 mg  4 mg Intravenous Q6H PRN Isaac Bliss, Rayford Halsted, MD      . pantoprazole (PROTONIX) EC tablet 40 mg  40 mg Oral BID Isaac Bliss, Rayford Halsted, MD   40 mg at 03/18/18 2115  . senna-docusate (Senokot-S) tablet 1 tablet  1 tablet Oral QHS PRN Isaac Bliss, Rayford Halsted, MD      . terazosin (HYTRIN) capsule 2 mg  2 mg Oral QHS Isaac Bliss, [place]  Y, MD   2 mg at 03/18/18 2115    Allergies as of 03/16/2018 - Review Complete 03/16/2018  Allergen Reaction Noted  . Wellbutrin [bupropion] Anxiety 03/21/2013    Family History  Problem Relation Age of Onset  . Diabetes Father   . Heart disease Father        MI  . Heart attack Father   . Heart attack Mother   . Heart disease Brother   . Cancer Brother        lung  . Lung cancer Brother   . Heart disease Brother   . Lung cancer Sister   . Cancer Sister        lung  . Heart disease Brother   . Heart disease Brother     Social History   Socioeconomic History  . Marital status: Married    Spouse name: Not on file  . Number of children: 0  . Years of education: Not on file  . Highest education level: Not on file  Occupational History  . Occupation: retired  Scientific laboratory technician  . Financial resource strain: Not on file  . Food insecurity:    Worry: Not on file    Inability: Not on file  . Transportation needs:    Medical: Not on file    Non-medical: Not on file  Tobacco Use  . Smoking status: Current Some Day Smoker    Packs/day: 0.50    Years:  62.00    Pack years: 31.00    Types: Cigarettes, Cigars    Last attempt to quit: 12/28/2016    Years since quitting: 1.2  . Smokeless tobacco: Never Used  Substance and Sexual Activity  . Alcohol use: No  . Drug use: No  . Sexual activity: Never  Lifestyle  . Physical activity:    Days per week: Not on file    Minutes per session: Not on file  . Stress: Not on file  Relationships  . Social connections:    Talks on phone: Not on file    Gets together: Not on file    Attends religious service: Not on file    Active member of club or organization: Not on file    Attends meetings of clubs or organizations: Not on file    Relationship status: Not on file  . Intimate partner violence:    Fear of current or ex partner: Not on file    Emotionally abused: Not on file    Physically abused: Not on file    Forced sexual activity: Not on file  Other Topics Concern  . 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 Married to his second wife, she is in a nursing home.    Review of Systems: Limited as noted above   Physical Exam: Vital signs in last 24 hours: Temp:  [98 F (36.7 C)-99.1 F (37.3 C)] 98.6 F (37 C) (05/07 0916) Pulse Rate:  [69-124] 121 (05/07 0916) Resp:  [13-23] 20 (05/07 0906) BP: (105-151)/(56-94) 114/61 (05/07 0916) SpO2:  [90 %-98 %] 92 % (05/07 0916) Weight:  [148 lb 9.4 oz (67.4 kg)] 148 lb 9.4 oz (67.4 kg) (05/06 1704) Last BM Date: 03/18/18 General:   Resting in bed but easily awakens. No distress. Confused, stating he is in [place]. Does not know the President but states "the one elected after President Obama", knows the year  Head:  Normocephalic and atraumatic. Eyes:  Sclera clear, no icterus.    Ears:  Normal auditory acuity. Nose:  No deformity, discharge,  or lesions. Mouth:  No deformity or lesions Lungs:  Scattered rhonchi, expiratory wheeze  Heart:  S1 S2 present no obvious murmurs   Abdomen:  Soft, nontender and nondistended. No masses, hepatosplenomegaly or hernias noted. Normal bowel sounds, without guarding, and without rebound. No obvious ecchymosis to abdomen or flank areas Rectal:  Deferred until time of colonoscopy.   Extremities:  Right arm in gauze, edematous hand  Neurologic:  Oriented to person and year only  Psych:  Flat affect   Intake/Output from previous day: 05/06 0701 - 05/07 0700 In: 1450 [P.O.:240; I.V.:710; IV Piggyback:500] Out: 201 [Urine:200; Stool:1] Intake/Output this shift: Total I/O In: 370 [P.O.:120; I.V.:250] Out: -   Lab Results: Recent Labs    03/17/18 1410 03/18/18 0843 03/19/18 0605  WBC 10.8* 7.7 6.6  HGB 8.4* 7.3* 5.6*  HCT 25.7* 22.3* 17.7*  PLT 246 196 166   BMET Recent Labs    03/16/18 2115 03/18/18 0843 03/19/18 0605  NA 144 140 140  K 4.7 4.1 4.2  CL 111 109 111  CO2 25 22 22   GLUCOSE 122* 92 99  BUN 41* 43* 44*  CREATININE 3.30* 3.32* 3.37*  CALCIUM 8.9 8.1* 7.7*   LFT Recent Labs    03/16/18 2115 03/18/18 0843  PROT 6.0* 4.8*  ALBUMIN 2.8* 2.3*  AST 17 15  ALT 13* 12*  ALKPHOS 99 70  BILITOT 1.1 1.0   PT/INR Recent Labs    03/16/18 2115 03/17/18 1410  LABPROT 52.4* 28.1*  INR 5.90* 2.66   Studies/Results:  CT head without contrast: mild diffuse cortical atrophy, mild chronic ischemic white matter disease, no acute intracranial abnormality Chest xray: retrocardiac/left lower lobe pulmonary nodule, corresponding to suspected primay bronchogenic neoplasm on PET-CT, small right pleural effusion  Forearm xray: soft tissue injury  Impression: 77 year old male with multiple comorbidities initially presenting to [facility] with depression, suicidal ideation, and awaiting psychiatric inpatient placement, found to have tachycardia, Hgb 7 range (originally 10 on 03/16/18), heme positive but without overt GI bleeding. INR greater than 6 on presentation to ED, and it is unclear if he had been on  anticoagulation prior, although he has a history of chronic anticoagulation in the past. Hgb today 5.6 with GI consult requested.   He is a difficult historian and confused; however, nursing staff denies any overt GI bleeding. Last EGD in March 2018 with LA Grade B esophagitis, gastritis, and last colonoscopy in 2015 with simple adenoma. From what I know, no prior capsule study. He has had prior evaluations for anemia in setting of anticoagulation most recently in 2018 and then previously in 2015. Today, he is without abdominal pain, no obvious bruising to abdomen and flanks, but his drop in Hgb is concerning. Will order CT abd/pelvis without IV contrast now due to history of fall and supratherapeutic INR on admission. Continue with blood transfusion and reassess tomorrow regarding need for upper endoscopy +/- capsule. He may ultimately need a colonoscopy as well but could pursue as outpatient as long as no obvious overt GI bleeding.   Plan: Check INR now CT abd/pelvis without IV contrast now 2 units PRBCs today, transfuse as needed Reassess in the morning regarding readiness for endoscopic procedures Will continue to follow with you   Annitta Needs, PhD, ANP-BC [facility]        LOS: 1 day    03/19/2018, 10:54 AM

## 2018-03-19 NOTE — Progress Notes (Signed)
INR now normal. CT without new findings.

## 2018-03-19 NOTE — Progress Notes (Signed)
Full note to follow. RN called with report of Hb of 5.6 this am. Order given to transfuse 2 units PRBCs. Given significant anemia and positive FOBT will ask GI for consultation. Patient denies hematochezia or melena, altho he does not seem to be the best historian.  Domingo Mend, MD [facility] Pager: [phone]

## 2018-03-19 NOTE — Progress Notes (Signed)
PROGRESS NOTE    Don [name]  AOZ:[id number] DOB: [birth year] DOA: 03/16/2018 PCP: Dettinger, Fransisca Kaufmann, MD     Brief Narrative:  77 year old man admitted on 5/6.  He had been awaiting emergent psychiatric placement due to major depression with suicidal ideation with an active suicide plan.  When he was evaluated by ED physician on day of admission he was noted to be pale, weak with a hemoglobin of 7.3.  As of 5/7 his hemoglobin has further dropped to 5.6, FOBT is positive.   Assessment & Plan:   Principal Problem:   Atrial fibrillation with RVR (HCC) Active Problems:   Suicidal ideation   Hypertension   Stage III chronic kidney disease (HCC)   Hyperlipidemia LDL goal <130   Normocytic anemia   Major depression   Atrial fibrillation with rapid ventricular response -I suspect RVR due to a combination of blood loss/dehydration in addition to his rate controlling medications being held. -Rates are improved to the low 100s today. -Continue bisoprolol and Cardizem.  Normocytic anemia -Definitely a chronic component due to chronic kidney disease but do suspect an acute blood loss anemia as well especially given rapidity of decreased hemoglobin in addition to positive fecal occult blood. -Is receiving 2 units of PRBCs on 5/7, GI consultation has been requested for consideration of colonoscopy, although I do believe that we should consider palliative care in this elderly gentleman with multiple medical comorbidities.  Will request. -Anticoagulation status remains unclear, the best I can gather from the chart is that in the past he used to be on Xarelto and this was discontinued due to his history of noncompliance as well as his age and falls.  Nonetheless his INR was greater than 5 on presentation on 5/4 and did require some vitamin K.  His INR has normalized. -GI has ordered a CT scan to rule out a retroperitoneal bleed as the cause of decreased Hb, but this remains pending at time of this  dictation.  Stage IV CKD -At baseline his Cr is between 2.3-2.5. -Remains at baseline. -Needs OP follow up with nephrology as he is close to needing HD. -No acute indications for HD.  Hyperlipidemia -Continue statin.  Acute Hypoxemic Respiratory Failure -Oxygen need is new. -Start with CXR. Last done on 5/3. -Has a nodule that is c/w primary bronchogenic carcinoma altho this has not been confirmed as of yet.  Depression with Suicidal Ideation -Will need TTS reassessment once medically clear.   DVT prophylaxis: SCDs Code Status: full code Family Communication: patient only Disposition Plan: pending medical stability will need psychiatric care  Consultants:   GI  TTS  Procedures:   none  Antimicrobials:  Anti-infectives (From admission, onward)   Start     Dose/Rate Route Frequency Ordered Stop   03/16/18 2200  cephALEXin (KEFLEX) capsule 500 mg     500 mg Oral  Once 03/16/18 2147 03/16/18 2158   03/16/18 0000  cephALEXin (KEFLEX) 500 MG capsule     500 mg Oral 3 times daily 03/16/18 2237         Subjective: Feels weak and tired  Objective: Vitals:   03/19/18 1300 03/19/18 1318 03/19/18 1336 03/19/18 1340  BP: 135/89 135/89 (!) 142/73 (!) 142/73  Pulse: (!) 102 94 76 92  Resp:  18 18   Temp: 98 F (36.7 C) 98 F (36.7 C) 98.5 F (36.9 C) 98.5 F (36.9 C)  TempSrc: Oral Oral Oral Oral  SpO2: 99% 99% 97% 100%  Weight:  Height:        Intake/Output Summary (Last 24 hours) at 03/19/2018 1610 Last data filed at 03/19/2018 1400 Gross per 24 hour  Intake 2310 ml  Output 601 ml  Net 1709 ml   Filed Weights   03/16/18 1941 03/18/18 1704  Weight: 73.5 kg (162 lb) 67.4 kg (148 lb 9.4 oz)    Examination:  General exam: drowsy but can answer uncomplicated questions., pale Respiratory system: Clear to auscultation. Respiratory effort normal. Cardiovascular system:RRR. No murmurs, rubs, gallops. Gastrointestinal system: Abdomen is nondistended, soft  and nontender. No organomegaly or masses felt. Normal bowel sounds heard. Central nervous system: Non focal Extremities: No C/C/E, +pedal pulses Skin: No rashes, lesions or ulcers Psychiatry: Unable to fully assess given current mental state.    Data Reviewed: I have personally reviewed following labs and imaging studies  CBC: Recent Labs  Lab 03/15/18 1902 03/16/18 2115 03/17/18 0658 03/17/18 1410 03/18/18 0843 03/19/18 0605  WBC 7.4 11.8* 9.7 10.8* 7.7 6.6  NEUTROABS 5.4 9.5*  --  7.7 5.6  --   HGB 9.5* 10.2* 8.0* 8.4* 7.3* 5.6*  HCT 28.8* 30.7* 24.0* 25.7* 22.3* 17.7*  MCV 95.7 95.3 94.5 94.5 94.5 97.8  PLT 222 249 216 246 196 811   Basic Metabolic Panel: Recent Labs  Lab 03/15/18 1902 03/16/18 2115 03/18/18 0843 03/19/18 0605  NA 142 144 140 140  K 4.4 4.7 4.1 4.2  CL 109 111 109 111  CO2 22 25 22 22   GLUCOSE 98 122* 92 99  BUN 41* 41* 43* 44*  CREATININE 3.57* 3.30* 3.32* 3.37*  CALCIUM 8.7* 8.9 8.1* 7.7*   GFR: Estimated Creatinine Clearance: 17.2 mL/min (A) (by C-G formula based on SCr of 3.37 mg/dL (H)). Liver Function Tests: Recent Labs  Lab 03/15/18 1902 03/16/18 2115 03/18/18 0843  AST 20 17 15   ALT 12* 13* 12*  ALKPHOS 98 99 70  BILITOT 0.9 1.1 1.0  PROT 6.1* 6.0* 4.8*  ALBUMIN 2.9* 2.8* 2.3*   No results for input(s): LIPASE, AMYLASE in the last 168 hours. No results for input(s): AMMONIA in the last 168 hours. Coagulation Profile: Recent Labs  Lab 03/15/18 1902 03/16/18 2115 03/17/18 1410 03/19/18 1210  INR 6.48* 5.90* 2.66 1.30   Cardiac Enzymes: Recent Labs  Lab 03/15/18 1902 03/18/18 0843 03/18/18 1813 03/19/18 0058 03/19/18 0605  CKTOTAL 37*  --   --   --   --   TROPONINI  --  0.04* 0.03* 0.03* 0.03*   BNP (last 3 results) No results for input(s): PROBNP in the last 8760 hours. HbA1C: No results for input(s): HGBA1C in the last 72 hours. CBG: Recent Labs  Lab 03/15/18 1916  GLUCAP 103*   Lipid Profile: No  results for input(s): CHOL, HDL, LDLCALC, TRIG, CHOLHDL, LDLDIRECT in the last 72 hours. Thyroid Function Tests: Recent Labs    03/18/18 1813  TSH 3.173   Anemia Panel: Recent Labs    03/18/18 1546 03/18/18 1626  VITAMINB12 419  --   FOLATE  --  39.0  FERRITIN 126  --   TIBC 192*  --   IRON 39*  --   RETICCTPCT 3.5*  --    Urine analysis:    Component Value Date/Time   COLORURINE YELLOW 03/15/2018 2110   APPEARANCEUR HAZY (A) 03/15/2018 2110   LABSPEC 1.013 03/15/2018 2110   PHURINE 5.0 03/15/2018 2110   GLUCOSEU NEGATIVE 03/15/2018 2110   HGBUR NEGATIVE 03/15/2018 2110   [place] NEGATIVE 03/15/2018 2110  BILIRUBINUR neg 10/07/2014 [place] 03/15/2018 2110   PROTEINUR 100 (A) 03/15/2018 2110   UROBILINOGEN negative 10/07/2014 1122   UROBILINOGEN 0.2 04/03/2014 2308   NITRITE NEGATIVE 03/15/2018 2110   LEUKOCYTESUR NEGATIVE 03/15/2018 2110   Sepsis Labs: @LABRCNTIP (procalcitonin:4,lacticidven:4)  )No results found for this or any previous visit (from the past 240 hour(s)).       Radiology Studies: No results found.      Scheduled Meds: . bisoprolol  10 mg Oral Daily  . calcitRIOL  0.25 mcg Oral Daily  . diltiazem  180 mg Oral Daily  . LORazepam  0.5 mg Oral QHS  . pantoprazole  40 mg Oral BID  . terazosin  2 mg Oral QHS   Continuous Infusions: . sodium chloride 75 mL/hr at 03/18/18 1932     LOS: 1 day    Time spent: 35 minutes. Greater than 50% of this time was spent in direct contact with the patient, coordinating care and discussing relevant ongoing clinical issues, including anemia, potential causes and poor overall prognosis.     Lelon Frohlich, MD [facility] Pager [phone]  If 7PM-7AM, please contact night-coverage [email or web address] Password [email or web address] 03/19/2018, 4:10 PM

## 2018-03-20 ENCOUNTER — Encounter (HOSPITAL_COMMUNITY): Payer: Self-pay | Admitting: Registered Nurse

## 2018-03-20 DIAGNOSIS — Z7189 Other specified counseling: Secondary | ICD-10-CM

## 2018-03-20 DIAGNOSIS — R195 Other fecal abnormalities: Secondary | ICD-10-CM

## 2018-03-20 DIAGNOSIS — Z515 Encounter for palliative care: Secondary | ICD-10-CM

## 2018-03-20 LAB — CBC
HEMATOCRIT: 26.7 % — AB (ref 39.0–52.0)
Hemoglobin: 8.9 g/dL — ABNORMAL LOW (ref 13.0–17.0)
MCHC: 33.3 g/dL (ref 30.0–36.0)
MCV: 89.9 fL (ref 78.0–100.0)
PLATELETS: 151 10*3/uL (ref 150–400)
RBC: 2.97 MIL/uL — AB (ref 4.22–5.81)
RDW: 18.6 % — ABNORMAL HIGH (ref 11.5–15.5)
WBC: 7.2 10*3/uL (ref 4.0–10.5)

## 2018-03-20 LAB — TYPE AND SCREEN
ABO/RH(D): A POS
ANTIBODY SCREEN: NEGATIVE
UNIT DIVISION: 0
Unit division: 0

## 2018-03-20 LAB — BPAM RBC
BLOOD PRODUCT EXPIRATION DATE: 201906042359
Blood Product Expiration Date: 201906042359
ISSUE DATE / TIME: 201905070845
ISSUE DATE / TIME: 201905071310
UNIT TYPE AND RH: 6200
Unit Type and Rh: 6200

## 2018-03-20 LAB — BASIC METABOLIC PANEL
Anion gap: 8 (ref 5–15)
BUN: 40 mg/dL — ABNORMAL HIGH (ref 6–20)
CHLORIDE: 109 mmol/L (ref 101–111)
CO2: 22 mmol/L (ref 22–32)
Calcium: 8.1 mg/dL — ABNORMAL LOW (ref 8.9–10.3)
Creatinine, Ser: 3.19 mg/dL — ABNORMAL HIGH (ref 0.61–1.24)
GFR, EST AFRICAN AMERICAN: 20 mL/min — AB (ref >60)
GFR, EST NON AFRICAN AMERICAN: 17 mL/min — AB (ref >60)
Glucose, Bld: 101 mg/dL — ABNORMAL HIGH (ref 65–99)
POTASSIUM: 4.3 mmol/L (ref 3.5–5.1)
SODIUM: 139 mmol/L (ref 135–145)

## 2018-03-20 NOTE — Progress Notes (Signed)
Disposition CSW asked to contact [facility] CSW, Esau Grew., LCSW, to request a consult with pt and his son,  [name], [phone], who is his POA, to discuss possible ALF placement.  Advised CSW that pt's son can be difficult to reach.  Areatha Keas. Judi Cong, MSW, [place] Disposition Clinical Social Work [phone] (cell) [phone] (office)

## 2018-03-20 NOTE — Consult Note (Signed)
Consultation Note Date: 03/20/2018   Patient Name: Don [name]  DOB: 14-Jun-1941  MRN: [id number]  Age / Sex: 77 y.o., male  PCP: Dettinger, Fransisca Kaufmann, MD Referring Physician: Rodena Goldmann, DO  Reason for Consultation: Establishing goals of care and Psychosocial/spiritual support  HPI/Patient Profile: 77 y.o. male  with past medical history of atrial fibrillation, COPD, high blood pressure and cholesterol, anxiety, stage III kidney disease, tubular adenoma of colon, prostate cancer 2015 admitted on 03/16/2018 with A. fib with RVR, major depressive and with suicidal ideation.   Clinical Assessment and Goals of Care: Don [name] is resting quietly in the [place] chair in his room.  He greets me making and keeping eye contact.  He is calm and cooperative.  There is no family at bedside at this time.  He is alert oriented to person and place.  We talked about healthcare power of attorney.  He tells me that his  stepson Don [name] is his power of attorney.  I share with him that Don [name], per paperwork in document section of epic is his [place].  Don [name] also has a healthcare power of attorney/advanced directive scanned in to the document section of epic, however this does not name any person as his surrogate decision-maker, instead only speaking to advanced directives.  Don [name] shares that his desires for his son Don [name] to be his healthcare surrogate decision-maker.  Call to son Don [name] at [phone].  Generic voicemail message left.  Healthcare Power of attorney NEXT OF KIN - Don [name] also has a healthcare power of attorney/advanced directive scanned in to the document section of epic, however this does not name any person as his surrogate decision-maker, instead only speaking to advanced directives.  Don [name] shares that his desires for his son Don [name] to be  his healthcare surrogate decision-maker.  SUMMARY OF RECOMMENDATIONS   Continue to treat the treatable, continue CODE STATUS discussions, seeking ALF placement.  Code Status/Advance Care Planning:  Full code -we discussed CODE STATUS briefly.  We talked about "treat the treatable, but no CPR, no intubation".   Mr. Dilauro defers, stating that his desire is for his son Don [name] to make healthcare choices for him.  Symptom Management:   Per hospitalist, no additional needs  Palliative Prophylaxis:   Frequent Pain Assessment  Additional Recommendations (Limitations, Scope, Preferences):  Full Scope Treatment  Psycho-social/Spiritual:   Desire for further Chaplaincy support:no  Additional Recommendations: Caregiving  Support/Resources  Prognosis:   Unable to determine, based on outcomes.  6 months or less would not be surprising.   Discharge Planning: seeking ALF placement       Primary Diagnoses: Present on Admission: . Atrial fibrillation with RVR ([place]) . Hypertension . Stage III chronic kidney disease ([place]) . Hyperlipidemia LDL goal <130 . Normocytic anemia   I have reviewed the medical record, interviewed the patient and family, and examined the patient. The following aspects are pertinent.  Past Medical History:  Diagnosis Date  .  Acute bronchitis 04/03/2014  . Anxiety   . Atrial fibrillation ([place])   . Cataract   . COPD (chronic obstructive pulmonary disease) ([place])   . Essential hypertension   . Hyperlipidemia   . Macular degeneration   . Noncompliance with medications 04/2013   Xarelto, digoxin previously  . Pre-diabetes   . Prostate cancer ([place])   . Stage III chronic kidney disease 04/03/2014  . Tubular adenoma of colon 07/31/02, 11/18/03   Social History   Socioeconomic History  . Marital status: Married    Spouse name: Not on file  . Number of children: 0  . Years of education: Not on file  . Highest education level: Not on file    Occupational History  . Occupation: retired  Scientific laboratory technician  . Financial resource strain: Not on file  . Food insecurity:    Worry: Not on file    Inability: Not on file  . Transportation needs:    Medical: Not on file    Non-medical: Not on file  Tobacco Use  . Smoking status: Current Some Day Smoker    Packs/day: 0.50    Years: 62.00    Pack years: 31.00    Types: Cigarettes, Cigars    Last attempt to quit: 12/28/2016    Years since quitting: 1.2  . Smokeless tobacco: Never Used  Substance and Sexual Activity  . Alcohol use: No  . Drug use: No  . Sexual activity: Never  Lifestyle  . Physical activity:    Days per week: Not on file    Minutes per session: Not on file  . Stress: Not on file  Relationships  . Social connections:    Talks on phone: Not on file    Gets together: Not on file    Attends religious service: Not on file    Active member of club or organization: Not on file    Attends meetings of clubs or organizations: Not on file    Relationship status: Not on file  Other Topics Concern  . 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 Married to his second wife, she is in a nursing home.   Family History  Problem Relation Age of Onset  . Diabetes Father   . Heart disease Father        MI  . Heart attack Father   . Heart attack Mother   . Heart disease Brother   . Cancer Brother        lung  . Lung cancer Brother   . Heart disease Brother   . Lung cancer Sister   . Cancer Sister        lung  . Heart disease Brother   . Heart disease Brother    Scheduled Meds: . bisoprolol  10 mg Oral Daily  . calcitRIOL  0.25 mcg Oral Daily  . diltiazem  180 mg Oral Daily  . LORazepam  0.5 mg Oral QHS  . pantoprazole  40 mg Oral BID  . terazosin  2 mg Oral QHS   Continuous Infusions: . sodium chloride 75 mL/hr at 03/20/18 0629   PRN Meds:.acetaminophen **OR** acetaminophen, ondansetron **OR**  ondansetron (ZOFRAN) IV, senna-docusate Medications Prior to Admission:  Prior to Admission medications   Medication Sig Start Date End Date Taking? Authorizing Provider  atorvastatin (LIPITOR) 20 MG tablet TAKE 1 TABLET DAILY 01/24/18  Yes Dettinger, Fransisca Kaufmann, MD  bisoprolol (ZEBETA) 10 MG  tablet TAKE (1) TABLET TWICE A DAY. 02/27/18  Yes Dettinger, Fransisca Kaufmann, MD  calcitRIOL (ROCALTROL) 0.25 MCG capsule Take 1 capsule (0.25 mcg total) by mouth daily. 02/12/18  Yes Dettinger, Fransisca Kaufmann, MD  clonazePAM (KLONOPIN) 1 MG tablet TAKE 1/2 TABLET ONCE DAILY AS NEEDED FOR ANXIETY 01/24/18  Yes Dettinger, Fransisca Kaufmann, MD  diltiazem (DILACOR XR) 180 MG 24 hr capsule Take 180 mg by mouth 2 (two) times daily.   Yes [provider]  iron polysaccharides (NIFEREX) 150 MG capsule Take 150 mg by mouth daily.   Yes [provider]  loratadine (CLARITIN) 10 MG tablet Take 10 mg by mouth daily.   Yes [provider]  pantoprazole (PROTONIX) 40 MG tablet Take 40 mg by mouth 2 (two) times daily.   Yes [provider]  terazosin (HYTRIN) 2 MG capsule Take 2 mg by mouth at bedtime.   Yes [provider]  cephALEXin (KEFLEX) 500 MG capsule Take 1 capsule (500 mg total) by mouth 3 (three) times daily. 03/16/18   Milton Ferguson, MD  warfarin (COUMADIN) 3 MG tablet TAKE 1 TABLET DAILY OR AS DIRECTED 03/19/18   Dettinger, Fransisca Kaufmann, MD   Allergies  Allergen Reactions  . Wellbutrin [Bupropion] Anxiety   Review of Systems  Unable to perform ROS: Mental status change    Physical Exam  Constitutional: No distress.  Makes and keeps eye contact, appears acutely/chronically ill.  HENT:  Head: Atraumatic.  Cardiovascular: Normal rate.  Pulmonary/Chest: Effort normal. No respiratory distress.  Abdominal: Soft. He exhibits no distension.  Neurological: He is alert.  Oriented to person place, confused about concepts related to advanced directives, power of attorney  Skin: Skin is warm and  dry.  Psychiatric:  Calm and cooperative  Nursing note and vitals reviewed.   Vital Signs: BP 124/67 (BP Location: Left Arm)   Pulse 99   Temp 97.8 F (36.6 C) (Oral)   Resp 20   Ht 5\' 9"  (1.753 m)   Wt 67.4 kg (148 lb 9.4 oz)   SpO2 97%   BMI 21.94 kg/m  Pain Scale: 0-10 POSS *See Group Information*: 1-Acceptable,Awake and alert Pain Score: Asleep   SpO2: SpO2: 97 % O2 Device:SpO2: 97 % O2 Flow Rate: .O2 Flow Rate (L/min): 1 L/min  IO: Intake/output summary:   Intake/Output Summary (Last 24 hours) at 03/20/2018 1629 Last data filed at 03/20/2018 7106 Gross per 24 hour  Intake 1575 ml  Output -  Net 1575 ml    LBM: Last BM Date: 03/20/18 Baseline Weight: Weight: 73.5 kg (162 lb) Most recent weight: Weight: 67.4 kg (148 lb 9.4 oz)     Palliative Assessment/Data:   Flowsheet Rows     Most Recent Value  Intake Tab  Referral Department  Hospitalist  Unit at Time of Referral  Cardiac/Telemetry Unit  Palliative Care Primary Diagnosis  Other (Comment) [GIB]  Date Notified  03/19/18  Palliative Care Type  New Palliative care  Reason for referral  Clarify Goals of Care, Advance Care Planning  Date of Admission  03/16/18  Date first seen by Palliative Care  03/20/18  # of days Palliative referral response time  1 Day(s)  # of days IP prior to Palliative referral  3  Clinical Assessment  Palliative Performance Scale Score  40%  Pain Max last 24 hours  Not able to report  Pain Min Last 24 hours  Not able to report  Dyspnea Max Last 24 Hours  Not able to report  Dyspnea Min Last 24 hours  Not able to report  Psychosocial & Spiritual Assessment  Palliative Care Outcomes  Patient/Family meeting held?  Yes  Who was at the meeting?  Patient at bedside      Time In: 1510 Time Out: 1545 Time Total: 35 minutes Greater than 50%  of this time was spent counseling and coordinating care related to the above assessment and plan.  Signed by: Drue Novel, NP   Please  contact Palliative Medicine Team phone at [phone] for questions and concerns.  For individual provider: See Shea Evans

## 2018-03-20 NOTE — BHH Counselor (Signed)
Reassessment- Completed by Delphia Grates, NP. Per Delphia Grates, NP Pt has been psych cleared.  Pt denied SI/HI and AVH.   Recommendations provided to Pt's POA [name]. The following recommendations were made: (a) removing guns from the Pt's home, (b) contacting the Pt's PCP about a nuerology appointment, and (c) speaking with AP's SW about a possible ALF placement.  Mr. Hassell Done was in agreement with the above referenced recommendations.   Lorenza Cambridge, [facility] Triage Specialist

## 2018-03-20 NOTE — Progress Notes (Signed)
PROGRESS NOTE    Don [name]  ERD:[id number] DOB: June 13, 1941 DOA: 03/16/2018 PCP: Dettinger, Fransisca Kaufmann, MD     Brief Narrative:  77 year old man admitted on 5/6.  He had been awaiting emergent psychiatric placement due to major depression with suicidal ideation with an active suicide plan.  When he was evaluated by ED physician on day of admission he was noted to be pale, weak with a hemoglobin of 7.3.  As of 5/7 his hemoglobin has further dropped to 5.6, FOBT is positive.   Assessment & Plan:   Principal Problem:   Atrial fibrillation with RVR (HCC) Active Problems:   Hypertension   Heme positive stool   Stage III chronic kidney disease ([facility])   Hyperlipidemia LDL goal <130   Normocytic anemia   Suicidal ideation   Major depression   Atrial fibrillation with rapid ventricular response-resolved -I suspect RVR due to a combination of blood loss/dehydration in addition to his rate controlling medications being held. Rates improved. -Continue bisoprolol and Cardizem.  Normocytic anemia-improved s/p 2U PRBC transfusion -Definitely a chronic component due to chronic kidney disease but do suspect an acute blood loss anemia as well especially given rapidity of decreased hemoglobin in addition to positive fecal occult blood. -Supratherapeutic INR has normalized after vitamin K administration CT of the abdomen and pelvis with no retroperitoneal bleed noted -Appreciate GI evaluation for consideration of colonoscopy -Appreciate palliative care consultation  Stage IV CKD -At baseline his Cr is between 2.3-2.5. -Remains at baseline. -Needs OP follow up with nephrology as he is close to needing HD. -No acute indications for HD.  Hyperlipidemia -Continue statin.  Acute Hypoxemic Respiratory Failure-improved -Chest x-ray with no acute findings, he is currently on room air.  Suspect this is secondary to severe anemia.  Depression with Suicidal Ideation -TTS ordered today for  evaluation   DVT prophylaxis: SCDs Code Status: full code Family Communication: patient only Disposition Plan: pending medical stability will need psychiatric care  Consultants:   GI  TTS  Procedures:   none  Antimicrobials:  Anti-infectives (From admission, onward)   Start     Dose/Rate Route Frequency Ordered Stop   03/16/18 2200  cephALEXin (KEFLEX) capsule 500 mg     500 mg Oral  Once 03/16/18 2147 03/16/18 2158   03/16/18 0000  cephALEXin (KEFLEX) 500 MG capsule     500 mg Oral 3 times daily 03/16/18 2237         Subjective: Patient seen and evaluated this morning.  He is having a difficult time chewing his food as he does not have his dentures.  He is otherwise more awake and alert this morning with no other acute events noted per sitter at bedside.  Objective: Vitals:   03/19/18 1630 03/19/18 2013 03/19/18 2121 03/20/18 0400  BP: 135/75  124/69 112/63  Pulse: 91  94 92  Resp:   12 13  Temp: (!) 97.4 F (36.3 C)  98.2 F (36.8 C) 98.5 F (36.9 C)  TempSrc: Oral  Oral Oral  SpO2: 99% 97% 96% 91%  Weight:      Height:        Intake/Output Summary (Last 24 hours) at 03/20/2018 1226 Last data filed at 03/20/2018 0906 Gross per 24 hour  Intake 2250 ml  Output 400 ml  Net 1850 ml   Filed Weights   03/16/18 1941 03/18/18 1704  Weight: 73.5 kg (162 lb) 67.4 kg (148 lb 9.4 oz)    Examination:  General exam: drowsy but  can answer uncomplicated questions., pale Respiratory system: Clear to auscultation. Respiratory effort normal. Cardiovascular system:RRR. No murmurs, rubs, gallops. Gastrointestinal system: Abdomen is nondistended, soft and nontender. No organomegaly or masses felt. Normal bowel sounds heard. Central nervous system: Non focal Extremities: No C/C/E, +pedal pulses Skin: No rashes, lesions or ulcers Psychiatry: Unable to fully assess given current mental state.    Data Reviewed: I have personally reviewed following labs and imaging  studies  CBC: Recent Labs  Lab 03/15/18 1902 03/16/18 2115 03/17/18 0658 03/17/18 1410 03/18/18 0843 03/19/18 0605 03/20/18 0608  WBC 7.4 11.8* 9.7 10.8* 7.7 6.6 7.2  NEUTROABS 5.4 9.5*  --  7.7 5.6  --   --   HGB 9.5* 10.2* 8.0* 8.4* 7.3* 5.6* 8.9*  HCT 28.8* 30.7* 24.0* 25.7* 22.3* 17.7* 26.7*  MCV 95.7 95.3 94.5 94.5 94.5 97.8 89.9  PLT 222 249 216 246 196 166 505   Basic Metabolic Panel: Recent Labs  Lab 03/15/18 1902 03/16/18 2115 03/18/18 0843 03/19/18 0605 03/20/18 0608  NA 142 144 140 140 139  K 4.4 4.7 4.1 4.2 4.3  CL 109 111 109 111 109  CO2 22 25 22 22 22   GLUCOSE 98 122* 92 99 101*  BUN 41* 41* 43* 44* 40*  CREATININE 3.57* 3.30* 3.32* 3.37* 3.19*  CALCIUM 8.7* 8.9 8.1* 7.7* 8.1*   GFR: Estimated Creatinine Clearance: 18.2 mL/min (A) (by C-G formula based on SCr of 3.19 mg/dL (H)). Liver Function Tests: Recent Labs  Lab 03/15/18 1902 03/16/18 2115 03/18/18 0843  AST 20 17 15   ALT 12* 13* 12*  ALKPHOS 98 99 70  BILITOT 0.9 1.1 1.0  PROT 6.1* 6.0* 4.8*  ALBUMIN 2.9* 2.8* 2.3*   No results for input(s): LIPASE, AMYLASE in the last 168 hours. No results for input(s): AMMONIA in the last 168 hours. Coagulation Profile: Recent Labs  Lab 03/15/18 1902 03/16/18 2115 03/17/18 1410 03/19/18 1210  INR 6.48* 5.90* 2.66 1.30   Cardiac Enzymes: Recent Labs  Lab 03/15/18 1902 03/18/18 0843 03/18/18 1813 03/19/18 0058 03/19/18 0605  CKTOTAL 37*  --   --   --   --   TROPONINI  --  0.04* 0.03* 0.03* 0.03*   BNP (last 3 results) No results for input(s): PROBNP in the last 8760 hours. HbA1C: No results for input(s): HGBA1C in the last 72 hours. CBG: Recent Labs  Lab 03/15/18 1916  GLUCAP 103*   Lipid Profile: No results for input(s): CHOL, HDL, LDLCALC, TRIG, CHOLHDL, LDLDIRECT in the last 72 hours. Thyroid Function Tests: Recent Labs    03/18/18 1813  TSH 3.173   Anemia Panel: Recent Labs    03/18/18 1546 03/18/18 1626   VITAMINB12 419  --   FOLATE  --  39.0  FERRITIN 126  --   TIBC 192*  --   IRON 39*  --   RETICCTPCT 3.5*  --    Urine analysis:    Component Value Date/Time   COLORURINE YELLOW 03/15/2018 2110   APPEARANCEUR HAZY (A) 03/15/2018 2110   LABSPEC 1.013 03/15/2018 2110   PHURINE 5.0 03/15/2018 2110   GLUCOSEU NEGATIVE 03/15/2018 2110   HGBUR NEGATIVE 03/15/2018 2110   BILIRUBINUR NEGATIVE 03/15/2018 2110   BILIRUBINUR neg 10/07/2014 1122   KETONESUR NEGATIVE 03/15/2018 2110   PROTEINUR 100 (A) 03/15/2018 2110   UROBILINOGEN negative 10/07/2014 1122   UROBILINOGEN 0.2 04/03/2014 2308   NITRITE NEGATIVE 03/15/2018 2110   LEUKOCYTESUR NEGATIVE 03/15/2018 2110   Sepsis Labs: @LABRCNTIP (procalcitonin:4,lacticidven:4)  )No  results found for this or any previous visit (from the past 240 hour(s)).       Radiology Studies: Ct Abdomen Pelvis Wo Contrast  Result Date: 03/19/2018 CLINICAL DATA:  Lung mass in LEFT lower lobe question bronchogenic carcinoma, history prostate cancer, prior retropubic prostatectomy, tubular adenoma of the colon, stage III chronic kidney disease, COPD, atrial fibrillation, hypertension EXAM: CT ABDOMEN AND PELVIS WITHOUT CONTRAST TECHNIQUE: Multidetector CT imaging of the abdomen and pelvis was performed following the standard protocol without IV contrast. Sagittal and coronal MPR images reconstructed from axial data set. Patient drank dilute oral contrast for exam. COMPARISON:  None; correlation PET-CT 02/21/2018 FINDINGS: Lower chest: Lobulated LEFT lower lobe mass with slightly spiculated margins 3.0 x 2.6 x 2.5 cm corresponding to FDG avid tumor on prior PET-CT. Small bibasilar pleural effusions and bibasilar atelectasis. Underlying emphysematous changes. Hepatobiliary: Calcified gallstones within a mildly distended gallbladder. Liver unremarkable. No biliary dilatation. Pancreas: Pancreatic atrophy. Duodenal diverticulum at pancreatic head. Spleen: No focal  mass.  Small calcified granuloma. Adrenals/Urinary Tract: Low-attenuation BILATERAL enlargement of the adrenal glands question adenomas versus marked hyperplasia, appearance stable versus prior PET-CT; these demonstrated no abnormal FDG accumulation. Upper pole RIGHT renal cyst unchanged. Kidneys, ureters and bladder otherwise unremarkable. Stomach/Bowel: Appendicular LEFT within the appendix. Scattered stool and tiny radiopacities in the colon. Stomach and bowel loops otherwise unremarkable. Vascular/Lymphatic: Extensive atherosclerotic calcifications aorta and iliac arteries. Low-attenuation of circulating blood question anemia. Enlargement of cardiac chambers. Mitral annular calcification. Few normal sized retroperitoneal and pelvic lymph nodes. No adenopathy. Reproductive: Prostate gland surgically absent Other: No free air or free fluid. No significant hernias. No acute inflammatory process. Musculoskeletal: No acute osseous findings. 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 Bibasilar atelectasis and small pleural effusions. Question BILATERAL adrenal adenomas versus marked hyperplasia slightly greater on LEFT, demonstrating no abnormal FDG accumulation on PET-CT. Cholelithiasis. No definite intra-abdominal or intrapelvic metastases identified. Electronically Signed   By: Lavonia Dana M.D.   On: 03/19/2018 16:14   Dg Chest Port 1 View  Result Date: 03/19/2018 CLINICAL DATA:  77 year old male with hypoxia. Subsequent encounter. EXAM: PORTABLE CHEST 1 VIEW COMPARISON:  03/15/2018 chest x-ray. 02/21/2018 PET-CT. 02/04/2018 chest CT. FINDINGS: Mild pulmonary vascular congestion. Mid right lung subsegmental atelectasis. Retrocardiac mass not as well delineated on present plain film exam. Heart size within normal limits. Aortic calcification. IMPRESSION: Mild pulmonary vascular congestion. Mid right lung subsegmental atelectasis. Retrocardiac mass not well delineated on present plain film exam.  Aortic Atherosclerosis (ICD10-I70.0). Electronically Signed   By: Genia Del M.D.   On: 03/19/2018 18:14        Scheduled Meds: . bisoprolol  10 mg Oral Daily  . calcitRIOL  0.25 mcg Oral Daily  . diltiazem  180 mg Oral Daily  . LORazepam  0.5 mg Oral QHS  . pantoprazole  40 mg Oral BID  . terazosin  2 mg Oral QHS   Continuous Infusions: . sodium chloride 75 mL/hr at 03/20/18 0629     LOS: 2 days    Time spent: 35 minutes. Greater than 50% of this time was spent in direct contact with the patient, coordinating care and discussing relevant ongoing clinical issues, including anemia, potential causes and poor overall prognosis.     Jaquana Geiger Darleen Crocker, DO [facility] Pager [phone]  If 7PM-7AM, please contact night-coverage [email or web address] Password [facility] 03/20/2018, 12:26 PM

## 2018-03-20 NOTE — Progress Notes (Signed)
Subjective:  No abdominal pain. Feels hungry. Dark stool this morning but "not black" per patient. Nursing staff denies melena, brbpr.   Objective: Vital signs in last 24 hours: Temp:  [97.4 F (36.3 C)-99.1 F (37.3 C)] 98.5 F (36.9 C) (05/08 0400) Pulse Rate:  [76-121] 92 (05/08 0400) Resp:  [12-20] 13 (05/08 0400) BP: (112-142)/(61-89) 112/63 (05/08 0400) SpO2:  [90 %-100 %] 91 % (05/08 0400) Last BM Date: 03/20/18 General:   Alert,  Well-developed, well-nourished, pleasant and cooperative in NAD. Nursing and sitter at bedside.  Head:  Normocephalic and atraumatic. Eyes:  Sclera clear, no icterus.  Abdomen:  Soft, nontender and nondistended.   Normal bowel sounds, without guarding, and without rebound.   Extremities:  Without clubbing, deformity or edema. Neurologic:  Alert and  oriented to person, place, time,  grossly normal neurologically. Skin:  Intact without significant lesions or rashes. Psych:  Alert and cooperative. Normal mood and affect.  Intake/Output from previous day: 05/07 0701 - 05/08 0700 In: 2695 [P.O.:240; I.V.:1825; Blood:630] Out: 400 [Urine:400] Intake/Output this shift: No intake/output data recorded.  Lab Results: CBC Recent Labs    03/18/18 0843 03/19/18 0605 03/20/18 0608  WBC 7.7 6.6 7.2  HGB 7.3* 5.6* 8.9*  HCT 22.3* 17.7* 26.7*  MCV 94.5 97.8 89.9  PLT 196 166 151   BMET Recent Labs    03/18/18 0843 03/19/18 0605 03/20/18 0608  NA 140 140 139  K 4.1 4.2 4.3  CL 109 111 109  CO2 22 22 22   GLUCOSE 92 99 101*  BUN 43* 44* 40*  CREATININE 3.32* 3.37* 3.19*  CALCIUM 8.1* 7.7* 8.1*   LFTs Recent Labs    03/18/18 0843  BILITOT 1.0  ALKPHOS 70  AST 15  ALT 12*  PROT 4.8*  ALBUMIN 2.3*   No results for input(s): LIPASE in the last 72 hours. PT/INR Recent Labs    03/17/18 1410 03/19/18 1210  LABPROT 28.1* 16.1*  INR 2.66 1.30      Imaging Studies: Ct Abdomen Pelvis Wo Contrast  Result Date: 03/19/2018 CLINICAL  DATA:  Lung mass in LEFT lower lobe question bronchogenic carcinoma, history prostate cancer, prior retropubic prostatectomy, tubular adenoma of the colon, stage III chronic kidney disease, COPD, atrial fibrillation, hypertension EXAM: CT ABDOMEN AND PELVIS WITHOUT CONTRAST TECHNIQUE: Multidetector CT imaging of the abdomen and pelvis was performed following the standard protocol without IV contrast. Sagittal and coronal MPR images reconstructed from axial data set. Patient drank dilute oral contrast for exam. COMPARISON:  None; correlation PET-CT 02/21/2018 FINDINGS: Lower chest: Lobulated LEFT lower lobe mass with slightly spiculated margins 3.0 x 2.6 x 2.5 cm corresponding to FDG avid tumor on prior PET-CT. Small bibasilar pleural effusions and bibasilar atelectasis. Underlying emphysematous changes. Hepatobiliary: Calcified gallstones within a mildly distended gallbladder. Liver unremarkable. No biliary dilatation. Pancreas: Pancreatic atrophy. Duodenal diverticulum at pancreatic head. Spleen: No focal mass.  Small calcified granuloma. Adrenals/Urinary Tract: Low-attenuation BILATERAL enlargement of the adrenal glands question adenomas versus marked hyperplasia, appearance stable versus prior PET-CT; these demonstrated no abnormal FDG accumulation. Upper pole RIGHT renal cyst unchanged. Kidneys, ureters and bladder otherwise unremarkable. Stomach/Bowel: Appendicular LEFT within the appendix. Scattered stool and tiny radiopacities in the colon. Stomach and bowel loops otherwise unremarkable. Vascular/Lymphatic: Extensive atherosclerotic calcifications aorta and iliac arteries. Low-attenuation of circulating blood question anemia. Enlargement of cardiac chambers. Mitral annular calcification. Few normal sized retroperitoneal and pelvic lymph nodes. No adenopathy. Reproductive: Prostate gland surgically absent Other: No free air or free  fluid. No significant hernias. No acute inflammatory process. Musculoskeletal:  No acute osseous findings. 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 Bibasilar atelectasis and small pleural effusions. Question BILATERAL adrenal adenomas versus marked hyperplasia slightly greater on LEFT, demonstrating no abnormal FDG accumulation on PET-CT. Cholelithiasis. No definite intra-abdominal or intrapelvic metastases identified. Electronically Signed   By: Lavonia Dana M.D.   On: 03/19/2018 16:14   Dg Chest 2 View  Result Date: 03/15/2018 CLINICAL DATA:  Altered mental status EXAM: CHEST - 2 VIEW COMPARISON:  PET-CT dated 02/21/2018 FINDINGS: Retrocardiac/left lower lobe pulmonary nodule, corresponding to suspected primary bronchogenic neoplasm on PET-CT. No focal consolidation. Small right pleural effusion. No pneumothorax. The heart is normal in size. Degenerative changes of the visualized thoracolumbar spine. IMPRESSION: Retrocardiac/left lower lobe pulmonary nodule, corresponding to suspected primary bronchogenic neoplasm on PET-CT. Small right pleural effusion. Electronically Signed   By: Julian Hy M.D.   On: 03/15/2018 20:10   Dg Forearm Right  Result Date: 03/16/2018 CLINICAL DATA:  Skin tears to forearm. EXAM: RIGHT FOREARM - 2 VIEW COMPARISON:  None. FINDINGS: No fracture of the radius or ulna. The elbow joint and the wrist joint appear normal on two views. Laceration along the soft tissues of the forearm. IMPRESSION: No fracture or dislocation.  Soft tissue injury. Electronically Signed   By: Suzy Bouchard M.D.   On: 03/16/2018 21:44   Dg Tibia/fibula Right  Result Date: 03/04/2018 CLINICAL DATA:  Fall on Saturday. Right leg pain. Initial encounter. EXAM: RIGHT TIBIA AND FIBULA - 2 VIEW COMPARISON:  None. FINDINGS: Generalized subcutaneous reticulation that is nonspecific. No acute fracture or malalignment. No opaque foreign body. IMPRESSION: Generalized swelling without acute osseous finding. Electronically Signed   By: Monte Fantasia M.D.   On:  03/04/2018 11:56   Ct Head Wo Contrast  Result Date: 03/16/2018 CLINICAL DATA:  Altered mental status.  Fall today. EXAM: CT HEAD WITHOUT CONTRAST TECHNIQUE: Contiguous axial images were obtained from the base of the skull through the vertex without intravenous contrast. COMPARISON:  CT scan of Mar 15, 2018. FINDINGS: Brain: Mild diffuse cortical atrophy is noted. Mild chronic ischemic white matter disease is noted. No mass effect or midline shift is noted. Ventricular size is within normal limits. There is no evidence of mass lesion, hemorrhage or acute infarction. Vascular: No hyperdense vessel or unexpected calcification. Skull: Normal. Negative for fracture or focal lesion. Sinuses/Orbits: Left sphenoid sinusitis is noted. Other: None. IMPRESSION: Mild diffuse cortical atrophy. Mild chronic ischemic white matter disease. No acute intracranial abnormality seen. Electronically Signed   By: Marijo Conception, M.D.   On: 03/16/2018 21:39   Ct Head Wo Contrast  Result Date: 03/15/2018 CLINICAL DATA:  Altered mental status, hallucinations, suicidal/homicidal ideation EXAM: CT HEAD WITHOUT CONTRAST TECHNIQUE: Contiguous axial images were obtained from the base of the skull through the vertex without intravenous contrast. COMPARISON:  MRI brain dated 02/18/2018 FINDINGS: Brain: No evidence of acute infarction, hemorrhage, hydrocephalus, extra-axial collection or mass lesion/mass effect. Cortical atrophy. Subcortical white matter and periventricular small vessel ischemic changes. Vascular: No hyperdense vessel or unexpected calcification. Skull: Normal. Negative for fracture or focal lesion. Sinuses/Orbits: Partial opacification of the right maxillary sinus. Mastoid air cells are clear. Other: None. IMPRESSION: No evidence of acute intracranial abnormality. Atrophy with small vessel ischemic changes. Electronically Signed   By: Julian Hy M.D.   On: 03/15/2018 20:02   Mr Brain Wo Contrast  Result Date:  02/18/2018 CLINICAL DATA:  Recent lung cancer  diagnosis, staging, no neuro symptoms are reported. EXAM: MRI HEAD WITHOUT CONTRAST TECHNIQUE: Multiplanar, multiecho pulse sequences of the brain and surrounding structures were obtained without intravenous contrast. Due to elevated creatinine level, contrast not administered. COMPARISON:  CT head 12/25/2014. FINDINGS: The patient was unable to remain motionless for the exam. Small or subtle lesions could be overlooked. Brain: No evidence for acute infarction, hemorrhage, mass lesion, hydrocephalus, or extra-axial fluid. Generalized atrophy. Advanced T2 and FLAIR hyperintensities throughout the white matter, consistent with small vessel disease. Vascular: Flow voids are maintained throughout the carotid, basilar, and vertebral arteries. There are no areas of chronic hemorrhage. Skull and upper cervical spine: Unremarkable visualized calvarium, skullbase, and cervical vertebrae. Pituitary, pineal, cerebellar tonsils unremarkable. No upper cervical cord lesions. Sinuses/Orbits: No orbital masses or proptosis. Globes appear symmetric. Sinuses appear well aerated, without evidence for air-fluid level, except for chronic mucosal thickening in the RIGHT maxillary sinus and LEFT division of the sphenoid. Other: None. Compared with prior head CT from 2016, atrophy and small vessel disease have progressed, when technique differences are considered. IMPRESSION: Noncontrast, motion degraded exam, demonstrating no definite intracranial metastatic disease. Atrophy with chronic microvascular ischemic change is noted. Electronically Signed   By: Staci Righter M.D.   On: 02/18/2018 08:55   Nm Pet Image Initial (pi) Skull Base To Thigh  Result Date: 02/21/2018 CLINICAL DATA:  Initial treatment strategy for lung mass. EXAM: NUCLEAR MEDICINE PET SKULL BASE TO THIGH TECHNIQUE: 8.28 mCi F-18 FDG was injected intravenously. Full-ring PET imaging was performed from the skull base to thigh  after the radiotracer. CT data was obtained and used for attenuation correction and anatomic localization. Fasting blood glucose: 98 mg/dl COMPARISON:  02/04/2018 FINDINGS: Mediastinal blood pool activity: SUV max 2.36 NECK: No hypermetabolic lymph nodes in the neck. Incidental CT findings: none CHEST: Left lower lobe pulmonary nodule measures 2.9 cm and has an SUV max equal to 11.4. No hypermetabolic mediastinal or hilar lymph nodes. Incidental CT findings: Left paratracheal lymph node measures 9 mm, image 76/4. Right paratracheal lymph node measures 1 cm. Subcarinal lymph node measures 1 cm. None of these demonstrate significant FDG uptake on the PET images. Left midlung perihilar densities identified new from previous exam compatible with a inflammatory or infectious pneumonitis. New bilateral pleural effusions with subsegmental atelectasis noted within the lung bases ABDOMEN/PELVIS: No abnormal hypermetabolic activity within the liver, pancreas, adrenal glands, or spleen. No hypermetabolic lymph nodes in the abdomen or pelvis. Incidental CT findings: Stone within the gallbladder measures 1.3 cm, image 130/4. Bilateral adrenal gland adenomas. Aortic atherosclerosis. Free fluid within the dependent portion of the pelvis identified. SKELETON: No focal hypermetabolic activity to suggest skeletal metastasis. Incidental CT findings: none IMPRESSION: 1. There is intense FDG uptake associated with the left lower lobe pulmonary nodule concerning for primary bronchogenic carcinoma. Correlation with tissue sampling advised. 2. No hypermetabolic mediastinal or hilar lymph nodes or evidence of distant metastatic disease. 3. Bilateral pleural effusions and nonspecific pneumonitis within the perihilar left lung. 4. Bilateral adrenal gland adenomas 5. Gallstone. 6. Aortic Atherosclerosis (ICD10-I70.0). 7. Free fluid noted within the dependent portion of the pelvis Electronically Signed   By: Kerby Moors M.D.   On: 02/21/2018  15:32   Dg Chest Port 1 View  Result Date: 03/19/2018 CLINICAL DATA:  77 year old male with hypoxia. Subsequent encounter. EXAM: PORTABLE CHEST 1 VIEW COMPARISON:  03/15/2018 chest x-ray. 02/21/2018 PET-CT. 02/04/2018 chest CT. FINDINGS: Mild pulmonary vascular congestion. Mid right lung subsegmental atelectasis. Retrocardiac mass not as  well delineated on present plain film exam. Heart size within normal limits. Aortic calcification. IMPRESSION: Mild pulmonary vascular congestion. Mid right lung subsegmental atelectasis. Retrocardiac mass not well delineated on present plain film exam. Aortic Atherosclerosis (ICD10-I70.0). Electronically Signed   By: Genia Del M.D.   On: 03/19/2018 18:14  [2 weeks]   Assessment: 77 year old male with multiple comorbidities initially presenting to [facility] with depression, suicidal ideation, and awaiting psychiatric inpatient placement, found to have tachycardia, Hgb 7 range (originally 10 on 03/16/18), heme positive but without overt GI bleeding. INR greater than 6 on presentation to ED, had been followed by coumadin clinic with last visit in 01/2018.  Hgb got down to 5.6 but up to 8.9 after two units of prbcs.    Seems more appropriate today. States his has an uneasy stomach from all the worry. Last EGD in March 2018 with LA Grade B esophagitis, gastritis, and last colonoscopy in 2015 with simple adenoma. From what I know, no prior capsule study. Yesterday he had some desaturation but seems to be improved today.   Recently diagnosed lung mass followed by oncology.  Plan: 1. To discuss plan with Dr. Gala Romney. May need colonoscopy as next step.  Laureen Ochs. Bernarda Caffey [facility] [phone] 5/8/201910:08 AM     LOS: 2 days

## 2018-03-20 NOTE — Consult Note (Signed)
  Tele Assessment   Don [name], 77 y.o., male patient presented to [facility] after calling the police several times related to auditory/visual hallucination and suicidal ideation.  Patient seen via telepsych by TTS and this provider; chart reviewed and consulted with Dr. Dwyane Dee on 03/20/18.  Patient was recommended for inpatient psychiatric treatment on initial psychiatric assessment.  On way to [facility] patient fell getting into patrol car and wan then taken back into the hospital; later on patient began to have other medical problems in which kept patient hospitalized.   03/20/18: On evaluation Don [name] reports that he is feeling better.  Patient is aware that he was having some delusional thoughts.  Patient denies suicidal/self-harm/homicidal ideation, psychosis, and paranoia.    During evaluation Don [name] is alert/oriented x 4; calm/cooperative; and mood congruent with affect.  He does not appear to be responding to internal/external stimuli or delusional thoughts.  Patient denied suicidal/self-harm/homicidal ideation, psychosis, and paranoia.  Patient answered question appropriately. Spoke with patient son who also feels that patient is safe to go home.  Also discussed assisted living or skilled nursing for patient.  For detailed note see TTS Tele Assessment Note  Recommendations:  Patient psychiatric cleared.    Disposition: No evidence of imminent risk to self or others at present.   Patient does not meet criteria for psychiatric inpatient admission.   Earleen Newport, NP

## 2018-03-20 NOTE — Care Management Important Message (Signed)
Important Message  Patient Details  Name: Don [name] MRN: [id number] Date of Birth: [birth year]   Medicare Important Message Given:  Yes    [name] 03/20/2018, 12:12 PM

## 2018-03-21 ENCOUNTER — Inpatient Hospital Stay (HOSPITAL_COMMUNITY): Payer: Medicare HMO | Admitting: Anesthesiology

## 2018-03-21 LAB — BASIC METABOLIC PANEL
Anion gap: 9 (ref 5–15)
BUN: 38 mg/dL — AB (ref 6–20)
CALCIUM: 8.2 mg/dL — AB (ref 8.9–10.3)
CO2: 21 mmol/L — AB (ref 22–32)
CREATININE: 3.16 mg/dL — AB (ref 0.61–1.24)
Chloride: 110 mmol/L (ref 101–111)
GFR calc non Af Amer: 17 mL/min — ABNORMAL LOW (ref >60)
GFR, EST AFRICAN AMERICAN: 20 mL/min — AB (ref >60)
GLUCOSE: 93 mg/dL (ref 65–99)
Potassium: 4.2 mmol/L (ref 3.5–5.1)
Sodium: 140 mmol/L (ref 135–145)

## 2018-03-21 LAB — CBC
HCT: 26 % — ABNORMAL LOW (ref 39.0–52.0)
Hemoglobin: 8.6 g/dL — ABNORMAL LOW (ref 13.0–17.0)
MCH: 29.7 pg (ref 26.0–34.0)
MCHC: 33.1 g/dL (ref 30.0–36.0)
MCV: 89.7 fL (ref 78.0–100.0)
Platelets: 172 10*3/uL (ref 150–400)
RBC: 2.9 MIL/uL — ABNORMAL LOW (ref 4.22–5.81)
RDW: 18.2 % — AB (ref 11.5–15.5)
WBC: 6.6 10*3/uL (ref 4.0–10.5)

## 2018-03-21 MED ORDER — DILTIAZEM HCL ER COATED BEADS 240 MG PO CP24
240.0000 mg | ORAL_CAPSULE | Freq: Every day | ORAL | Status: DC
  Administered 2018-03-22 – 2018-03-29 (×8): 240 mg via ORAL
  Filled 2018-03-21 (×8): qty 1

## 2018-03-21 MED ORDER — DILTIAZEM HCL 60 MG PO TABS
60.0000 mg | ORAL_TABLET | Freq: Once | ORAL | Status: AC
  Administered 2018-03-21: 60 mg via ORAL
  Filled 2018-03-21: qty 1

## 2018-03-21 MED ORDER — IPRATROPIUM-ALBUTEROL 0.5-2.5 (3) MG/3ML IN SOLN
3.0000 mL | RESPIRATORY_TRACT | Status: AC
  Administered 2018-03-21: 3 mL via RESPIRATORY_TRACT
  Filled 2018-03-21: qty 3

## 2018-03-21 NOTE — Care Management Note (Signed)
Case Management Note  Patient Details  Name: Don [name] MRN: [id number] Date of Birth: 1941-01-02  If discussed at [place] Length of Stay Meetings, dates discussed:  03/21/2018  Additional Comments:  Sherald Barge, RN 03/21/2018, 12:04 PM

## 2018-03-21 NOTE — Clinical Social Work Note (Signed)
Clinical Social Work Assessment  Patient Details  Name: Don [name] MRN: [id number] Date of Birth: [birth year]  Date of referral:  03/21/18               Reason for consult:  Facility Placement                Permission sought to share information with:    Permission granted to share information::     Name::        Agency::     Relationship::     Contact Information:     Housing/Transportation Living arrangements for the past 2 months:  Mobile Home Source of Information:  Patient Patient Interpreter Needed:  None Criminal Activity/Legal Involvement Pertinent to Current Situation/Hospitalization:  No - Comment as needed Significant Relationships:  Adult Children Lives with:  Self Do you feel safe going back to the place where you live?  Yes Need for family participation in patient care:  Yes (Comment)  Care giving concerns:  None identified by patient.    Social Worker assessment / plan:  Patient ambulates unassisted and reports that he is independent in his ADLs. Patient states he is in "fairly decent" health.  He states that he is considering going to an ALF. He states that his income is around $1400/month. LCSW discussed that without Medicaid his monthly income may not be enough to cover entire ALF monthly cost.   Employment status:  Retired Nurse, adult PT Recommendations:  Not assessed at this time Information / Referral to community resources:     Patient/Family's Response to care:  Patient wants ALF placement.   Patient/Family's Understanding of and Emotional Response to Diagnosis, Current Treatment, and Prognosis:  Patient understands his diagnosis, treatment and prognosis and feels ALF is the best setting for him in his current status.   Emotional Assessment Appearance:  Appears stated age Attitude/Demeanor/Rapport:    Affect (typically observed):  Accepting Orientation:  Oriented to Self, Oriented to Place, Oriented to  Time, Oriented  to Situation Alcohol / Substance use:  Not Applicable Psych involvement (Current and /or in the community):  No (Comment)  Discharge Needs  Concerns to be addressed:  Discharge Planning Concerns Readmission within the last 30 days:  No Current discharge risk:  None Barriers to Discharge:  No Barriers Identified   Ihor Gully, LCSW 03/21/2018, 4:54 PM

## 2018-03-21 NOTE — NC FL2 (Signed)
[place] LEVEL OF CARE SCREENING TOOL     IDENTIFICATION  Patient Name: Don [name] Birthdate: 1941/02/27 Sex: male Admission Date (Current Location): 03/16/2018  [facility] and [place] Number:  [employer] and Address:  [place] [address], [place]      Provider Number: [id number]  Attending Physician Name and Address:  Rodena Goldmann, DO  Relative Name and Phone Number:       Current Level of Care: Hospital Recommended Level of Care: [place] Prior Approval Number:    Date Approved/Denied:   PASRR Number:    Discharge Plan: Other (Comment)(ALF)    Current Diagnoses: Patient Active Problem List   Diagnosis Date Noted  . Palliative care by specialist   . Goals of care, counseling/discussion   . DNR (do not resuscitate) discussion   . Atrial fibrillation with RVR ([place]) 03/18/2018  . Suicidal ideation 03/18/2018  . 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 Mass of lower lobe of left lung 02/12/2018  . Depression, recurrent ([place]) 01/02/2018  . Anemia   . Normocytic anemia   . Coagulopathy ([place]) 01/31/2017  . Pancytopenia ([place]) 01/31/2017  . Respiratory distress   . Pre-diabetes 07/23/2015  . Metabolic syndrome 69/62/9528  . Urinary bladder incontinence 03/04/2015  . Paroxysmal atrial fibrillation ([place]) 05/25/2014  . Hyperlipidemia LDL goal <130 04/04/2014  . Personal history of colonic polyps 04/04/2014  . Heme positive stool 04/03/2014  . History of prostate cancer 04/03/2014  . Stage III chronic kidney disease ([place]) 04/03/2014  . Tobacco abuse 04/25/2013  . Prostate cancer ([place]) 02/08/2011  . COPD (chronic obstructive pulmonary disease) ([place]) 02/08/2011  . Hypertension 02/08/2011    Orientation RESPIRATION BLADDER Height & Weight     Self, Time, Situation, Place  Normal Continent Weight: 148 lb 9.4 oz (67.4 kg) Height:  5\' 9"  (175.3 cm)  BEHAVIORAL SYMPTOMS/MOOD NEUROLOGICAL BOWEL NUTRITION  STATUS      Continent (see discharge summary)  AMBULATORY STATUS COMMUNICATION OF NEEDS Skin   Independent Verbally Other (Comment)(laceration arm right, lower)                       Personal Care Assistance Level of Assistance  Bathing, Feeding, Dressing Bathing Assistance: Limited assistance Feeding assistance: Independent Dressing Assistance: Limited assistance     Functional Limitations Info  Sight, Hearing, Speech Sight Info: Adequate Hearing Info: Adequate Speech Info: Adequate    SPECIAL CARE FACTORS FREQUENCY                       Contractures Contractures Info: Not present    Additional Factors Info  Code Status, Allergies, Psychotropic Code Status Info: Full Code Allergies Info: Wellbutrin Psychotropic Info: Klonopin         Current Medications (03/21/2018):  This is the current hospital active medication list Current Facility-Administered Medications  Medication Dose Route Frequency Provider Last Rate Last Dose  . 0.9 %  sodium chloride infusion   Intravenous Continuous Isaac Bliss, Rayford Halsted, MD 75 mL/hr at 03/21/18 1201    . acetaminophen (TYLENOL) tablet 650 mg  650 mg Oral Q6H PRN Isaac Bliss, Rayford Halsted, MD   650 mg at 03/20/18 2201   Or  . acetaminophen (TYLENOL) suppository 650 mg  650 mg Rectal Q6H PRN Isaac Bliss, Rayford Halsted, MD      . bisoprolol (ZEBETA) tablet 10 mg  10 mg Oral Daily Oswald Hillock, MD  10 mg at 03/21/18 1023  . calcitRIOL (ROCALTROL) capsule 0.25 mcg  0.25 mcg Oral Daily Isaac Bliss, Rayford Halsted, MD   0.25 mcg at 03/21/18 1023  . diltiazem (CARDIZEM CD) 24 hr capsule 180 mg  180 mg Oral Daily Isaac Bliss, Rayford Halsted, MD   180 mg at 03/21/18 1024  . LORazepam (ATIVAN) tablet 0.5 mg  0.5 mg Oral QHS Nita Sells, MD   0.5 mg at 03/20/18 2149  . ondansetron (ZOFRAN) tablet 4 mg  4 mg Oral Q6H PRN Isaac Bliss, Rayford Halsted, MD       Or  . ondansetron [facility]) injection 4 mg  4 mg Intravenous Q6H PRN  Isaac Bliss, Rayford Halsted, MD      . pantoprazole (PROTONIX) EC tablet 40 mg  40 mg Oral BID Isaac Bliss, Rayford Halsted, MD   40 mg at 03/21/18 1023  . senna-docusate (Senokot-S) tablet 1 tablet  1 tablet Oral QHS PRN Isaac Bliss, Rayford Halsted, MD      . terazosin (HYTRIN) capsule 2 mg  2 mg Oral QHS Isaac Bliss, Rayford Halsted, MD   2 mg at 03/20/18 2149     Discharge Medications: Please see discharge summary for a list of discharge medications.  Relevant Imaging Results:  Relevant Lab Results:   Additional Information SSN [id number].   Coron Rossano, Clydene Pugh, LCSW

## 2018-03-21 NOTE — Progress Notes (Signed)
PROGRESS NOTE    Don [name]  LYY:[id number] DOB: 1941/07/11 DOA: 03/16/2018 PCP: Dettinger, Fransisca Kaufmann, MD     Brief Narrative:  77 year old man admitted on 5/6.  He had been awaiting emergent psychiatric placement due to major depression with suicidal ideation with an active suicide plan.  When he was evaluated by ED physician on day of admission he was noted to be pale, weak with a hemoglobin of 7.3.  As of 5/7 his hemoglobin has further dropped to 5.6, FOBT is positive.  He is now status post 2 unit PRBCs with good stability noted.  GI planning for EGD once cleared by cardiology for sedation.   Assessment & Plan:   Principal Problem:   Atrial fibrillation with RVR (HCC) Active Problems:   Hypertension   Heme positive stool   Stage III chronic kidney disease (HCC)   Hyperlipidemia LDL goal <130   Normocytic anemia   Suicidal ideation   Major depression   Palliative care by specialist   Goals of care, counseling/discussion   DNR (do not resuscitate) discussion   Atrial fibrillation with rapid ventricular response-resolved -I suspect RVR due to a combination of blood loss/dehydration in addition to his rate controlling medications being held. Rates elevated today. -Continue bisoprolol and Cardizem -Appreciate cardiology evaluation; consult placed by GI  Normocytic anemia-improved s/p 2U PRBC transfusion; stable -Definitely a chronic component due to chronic kidney disease but do suspect an acute blood loss anemia as well especially given rapidity of decreased hemoglobin in addition to positive fecal occult blood. -Supratherapeutic INR has normalized after vitamin K administration CT of the abdomen and pelvis with no retroperitoneal bleed noted -Appreciate GI evaluation with plans for EGD once stable from cardiopulmonary standpoint -Appreciate palliative care consultation  Stage IV CKD -At baseline his Cr is between 2.3-2.5. -Remains at baseline. -Needs OP follow up with  nephrology as he is close to needing HD. -No acute indications for HD.  Hyperlipidemia -Continue statin.  Acute Hypoxemic Respiratory Failure-improved -Chest x-ray with no acute findings, he is currently on room air.  Suspect this is secondary to severe anemia.  Depression with Suicidal Ideation -Cleared by TTS for discharge to assisted living facility -No further ideations currently noted   DVT prophylaxis: SCDs Code Status: full code Family Communication: patient only Disposition Plan: Plan for discharge to assisted living facility once EGD is performed and no sign of active bleed.  Currently awaiting clearance from cardiology for sedation. Consultants: GI and cardiology  GI  TTS  Cardiology  Procedures:   none  Antimicrobials:  Anti-infectives (From admission, onward)   Start     Dose/Rate Route Frequency Ordered Stop   03/16/18 2200  cephALEXin (KEFLEX) capsule 500 mg     500 mg Oral  Once 03/16/18 2147 03/16/18 2158   03/16/18 0000  cephALEXin (KEFLEX) 500 MG capsule     500 mg Oral 3 times daily 03/16/18 2237         Subjective: Patient seen and evaluated this morning.  He denies any particular complaints or concerns this morning.  He is awaiting EGD once stable.  Objective: Vitals:   03/20/18 2034 03/20/18 2130 03/21/18 0549 03/21/18 1339  BP:  (!) 159/91 (!) 153/90 (!) 148/104  Pulse:  (!) 109 91 (!) 127  Resp:  20 18 16   Temp:  97.9 F (36.6 C) 98.3 F (36.8 C) 98.5 F (36.9 C)  TempSrc:  Oral Oral Oral  SpO2: 97% 99% 96% 93%  Weight:  Height:        Intake/Output Summary (Last 24 hours) at 03/21/2018 1519 Last data filed at 03/21/2018 1201 Gross per 24 hour  Intake 1021.75 ml  Output 600 ml  Net 421.75 ml   Filed Weights   03/16/18 1941 03/18/18 1704  Weight: 73.5 kg (162 lb) 67.4 kg (148 lb 9.4 oz)    Examination:  General exam: drowsy but can answer uncomplicated questions., pale Respiratory system: Clear to auscultation.  Respiratory effort normal. Cardiovascular system:RRR. No murmurs, rubs, gallops. Gastrointestinal system: Abdomen is nondistended, soft and nontender. No organomegaly or masses felt. Normal bowel sounds heard. Central nervous system: Non focal Extremities: No C/C/E, +pedal pulses Skin: No rashes, lesions or ulcers Psychiatry: Unable to fully assess given current mental state.    Data Reviewed: I have personally reviewed following labs and imaging studies  CBC: Recent Labs  Lab 03/15/18 1902 03/16/18 2115  03/17/18 1410 03/18/18 0843 03/19/18 0605 03/20/18 0608 03/21/18 0638  WBC 7.4 11.8*   < > 10.8* 7.7 6.6 7.2 6.6  NEUTROABS 5.4 9.5*  --  7.7 5.6  --   --   --   HGB 9.5* 10.2*   < > 8.4* 7.3* 5.6* 8.9* 8.6*  HCT 28.8* 30.7*   < > 25.7* 22.3* 17.7* 26.7* 26.0*  MCV 95.7 95.3   < > 94.5 94.5 97.8 89.9 89.7  PLT 222 249   < > 246 196 166 151 172   < > = values in this interval not displayed.   Basic Metabolic Panel: Recent Labs  Lab 03/16/18 2115 03/18/18 0843 03/19/18 0605 03/20/18 0608 03/21/18 0638  NA 144 140 140 139 140  K 4.7 4.1 4.2 4.3 4.2  CL 111 109 111 109 110  CO2 25 22 22 22  21*  GLUCOSE 122* 92 99 101* 93  BUN 41* 43* 44* 40* 38*  CREATININE 3.30* 3.32* 3.37* 3.19* 3.16*  CALCIUM 8.9 8.1* 7.7* 8.1* 8.2*   GFR: Estimated Creatinine Clearance: 18.4 mL/min (A) (by C-G formula based on SCr of 3.16 mg/dL (H)). Liver Function Tests: Recent Labs  Lab 03/15/18 1902 03/16/18 2115 03/18/18 0843  AST 20 17 15   ALT 12* 13* 12*  ALKPHOS 98 99 70  BILITOT 0.9 1.1 1.0  PROT 6.1* 6.0* 4.8*  ALBUMIN 2.9* 2.8* 2.3*   No results for input(s): LIPASE, AMYLASE in the last 168 hours. No results for input(s): AMMONIA in the last 168 hours. Coagulation Profile: Recent Labs  Lab 03/15/18 1902 03/16/18 2115 03/17/18 1410 03/19/18 1210  INR 6.48* 5.90* 2.66 1.30   Cardiac Enzymes: Recent Labs  Lab 03/15/18 1902 03/18/18 0843 03/18/18 1813 03/19/18 0058  03/19/18 0605  CKTOTAL 37*  --   --   --   --   TROPONINI  --  0.04* 0.03* 0.03* 0.03*   BNP (last 3 results) No results for input(s): PROBNP in the last 8760 hours. HbA1C: No results for input(s): HGBA1C in the last 72 hours. CBG: Recent Labs  Lab 03/15/18 1916  GLUCAP 103*   Lipid Profile: No results for input(s): CHOL, HDL, LDLCALC, TRIG, CHOLHDL, LDLDIRECT in the last 72 hours. Thyroid Function Tests: Recent Labs    03/18/18 1813  TSH 3.173   Anemia Panel: Recent Labs    03/18/18 1546 03/18/18 1626  VITAMINB12 419  --   FOLATE  --  39.0  FERRITIN 126  --   TIBC 192*  --   IRON 39*  --   RETICCTPCT 3.5*  --  Urine analysis:    Component Value Date/Time   COLORURINE YELLOW 03/15/2018 2110   APPEARANCEUR HAZY (A) 03/15/2018 2110   LABSPEC 1.013 03/15/2018 2110   PHURINE 5.0 03/15/2018 2110   GLUCOSEU NEGATIVE 03/15/2018 2110   HGBUR NEGATIVE 03/15/2018 2110   BILIRUBINUR NEGATIVE 03/15/2018 2110   BILIRUBINUR neg 10/07/2014 1122   KETONESUR NEGATIVE 03/15/2018 2110   PROTEINUR 100 (A) 03/15/2018 2110   UROBILINOGEN negative 10/07/2014 1122   UROBILINOGEN 0.2 04/03/2014 2308   NITRITE NEGATIVE 03/15/2018 2110   LEUKOCYTESUR NEGATIVE 03/15/2018 2110   Sepsis Labs: @LABRCNTIP (procalcitonin:4,lacticidven:4)  )No results found for this or any previous visit (from the past 240 hour(s)).    Radiology Studies: Dg Chest Port 1 View  Result Date: 03/19/2018 CLINICAL DATA:  77 year old male with hypoxia. Subsequent encounter. EXAM: PORTABLE CHEST 1 VIEW COMPARISON:  03/15/2018 chest x-ray. 02/21/2018 PET-CT. 02/04/2018 chest CT. FINDINGS: Mild pulmonary vascular congestion. Mid right lung subsegmental atelectasis. Retrocardiac mass not as well delineated on present plain film exam. Heart size within normal limits. Aortic calcification. IMPRESSION: Mild pulmonary vascular congestion. Mid right lung subsegmental atelectasis. Retrocardiac mass not well delineated on  present plain film exam. Aortic Atherosclerosis (ICD10-I70.0). Electronically Signed   By: Genia Del M.D.   On: 03/19/2018 18:14     Scheduled Meds: . bisoprolol  10 mg Oral Daily  . calcitRIOL  0.25 mcg Oral Daily  . diltiazem  180 mg Oral Daily  . LORazepam  0.5 mg Oral QHS  . pantoprazole  40 mg Oral BID  . terazosin  2 mg Oral QHS   Continuous Infusions: . sodium chloride 75 mL/hr at 03/21/18 1201     LOS: 3 days    Time spent: 35 minutes. Greater than 50% of this time was spent in direct contact with the patient, coordinating care and discussing relevant ongoing clinical issues, including anemia, potential causes and poor overall prognosis.     Pratik Darleen Crocker, DO [facility] Pager [phone]  If 7PM-7AM, please contact night-coverage [email or web address] Password [email or web address] 03/21/2018, 3:19 PM

## 2018-03-21 NOTE — Consult Note (Addendum)
Cardiology Consultation:   Patient ID: Don [name]; [id number]; [birth year]   Admit date: 03/16/2018 Date of Consult: 03/21/2018  Primary Care Provider: Dettinger, Fransisca Kaufmann, MD Primary Cardiologist: Dr Percival Spanish   Patient Profile:   Don [name] is a 77 y.o. male with a hx of CAF who is being seen today for the evaluation of pre op clearance at the request of Dr Manuella Ghazi.  History of Present Illness:   Don [name] is a 77 y/o male seen by cardiology in the past for mild CM and CAF. Don [name] has been maintaned on Coumadin and rate control. Don [name] last echo in Feb 2018 showed Don [name] EF had normalized. We last saw him when Don [name] was admitted with Flu and COPD exacerbation Feb 2018.   Don [name] was admitted through the ED 03/15/18 with depression and suicidal ideations. There were no psychiatric beds and Don [name] was being held in the ED. Unfortunately Don [name] wasn't eating or drinking, or getting Don [name] medications. Don [name] INR on admission was 6.48, Don [name] Hgb 9.5. On 03/18/18 Don [name] was noted to be hypotensive and in rapid AF. Don [name] Hgb dropped to 5.6 with positive stool guaiac. Don [name] needs GI evaluation and we have been asked to see for cardiac clearance. The pt denies any chest pain. Don [name] had a Myoview in 2015 that was low risk. Don [name] HR is stable-90-100. Hgb is up to 8.6 after transfusion. Coumadin is on hold, INR-1.30 on 5/7.   Past Medical History:  Diagnosis Date  . Acute bronchitis 04/03/2014  . Anxiety   . Atrial fibrillation ([place])   . Cataract   . COPD (chronic obstructive pulmonary disease) ([place])   . Essential hypertension   . Hyperlipidemia   . Macular degeneration   . Noncompliance with medications 04/2013   Xarelto, digoxin previously  . Pre-diabetes   . Prostate cancer ([place])   . Stage III chronic kidney disease 04/03/2014  . Tubular adenoma of colon 07/31/02, 11/18/03    Past Surgical History:  Procedure Laterality Date  . Anal abcess,Hemorroids,    . COLONOSCOPY N/A 04/05/2014   Dr. Fuller Plan: 6 mm sessile polyp from sigmoid colon  (tubular adenoma), internal hemorrhoids  . COLONOSCOPY W/ BIOPSIES  11/18/2003   Dr. Earle Gell  . ESOPHAGOGASTRODUODENOSCOPY  11/18/2003   Dr. Earle Gell  . ESOPHAGOGASTRODUODENOSCOPY N/A 04/05/2014   Dr. Fuller Plan: variable Z line, negative Barrett's, small hiatal hernia  . ESOPHAGOGASTRODUODENOSCOPY N/A 02/02/2017   Dr. Oneida Alar: LA Grade B esophagitis, one moderate benign-appearing intrinsic stenosis traversed, small non-bleeding diverticulum in second portion of duodenum, reactive gastropathy, no H.pylori.  [name] RETROPUBIC PROSTATECTOMY  11/26/2001  . TONSILLECTOMY       Home Medications:  Prior to Admission medications   Medication Sig Start Date End Date Taking? Authorizing Provider  atorvastatin (LIPITOR) 20 MG tablet TAKE 1 TABLET DAILY 01/24/18  Yes Dettinger, Fransisca Kaufmann, MD  bisoprolol (ZEBETA) 10 MG tablet TAKE (1) TABLET TWICE A DAY. 02/27/18  Yes Dettinger, Fransisca Kaufmann, MD  calcitRIOL (ROCALTROL) 0.25 MCG capsule Take 1 capsule (0.25 mcg total) by mouth daily. 02/12/18  Yes Dettinger, Fransisca Kaufmann, MD  clonazePAM (KLONOPIN) 1 MG tablet TAKE 1/2 TABLET ONCE DAILY AS NEEDED FOR ANXIETY 01/24/18  Yes Dettinger, Fransisca Kaufmann, MD  diltiazem (DILACOR XR) 180 MG 24 hr capsule Take 180 mg by mouth 2 (two) times daily.   Yes [provider]  iron polysaccharides (NIFEREX) 150 MG capsule Take 150 mg by mouth daily.   Yes [provider]  loratadine (CLARITIN) 10 MG tablet  Take 10 mg by mouth daily.   Yes [provider]  pantoprazole (PROTONIX) 40 MG tablet Take 40 mg by mouth 2 (two) times daily.   Yes [provider]  terazosin (HYTRIN) 2 MG capsule Take 2 mg by mouth at bedtime.   Yes [provider]  cephALEXin (KEFLEX) 500 MG capsule Take 1 capsule (500 mg total) by mouth 3 (three) times daily. 03/16/18   Milton Ferguson, MD  warfarin (COUMADIN) 3 MG tablet TAKE 1 TABLET DAILY OR AS DIRECTED 03/19/18   Dettinger, Fransisca Kaufmann, MD    Inpatient  Medications: Scheduled Meds: . bisoprolol  10 mg Oral Daily  . calcitRIOL  0.25 mcg Oral Daily  . diltiazem  180 mg Oral Daily  . LORazepam  0.5 mg Oral QHS  . pantoprazole  40 mg Oral BID  . terazosin  2 mg Oral QHS   Continuous Infusions: . sodium chloride 75 mL/hr at 03/21/18 1201   PRN Meds: acetaminophen **OR** acetaminophen, ondansetron **OR** ondansetron (ZOFRAN) IV, senna-docusate  Allergies:    Allergies  Allergen Reactions  . Wellbutrin [Bupropion] Anxiety    Social History:   Social History   Socioeconomic History  . Marital status: Married    Spouse name: Not on file  . Number of children: 0  . Years of education: Not on file  . Highest education level: Not on file  Occupational History  . Occupation: retired  Scientific laboratory technician  . Financial resource strain: Not on file  . Food insecurity:    Worry: Not on file    Inability: Not on file  . Transportation needs:    Medical: Not on file    Non-medical: Not on file  Tobacco Use  . Smoking status: Current Some Day Smoker    Packs/day: 0.50    Years: 62.00    Pack years: 31.00    Types: Cigarettes, Cigars    Last attempt to quit: 12/28/2016    Years since quitting: 1.2  . Smokeless tobacco: Never Used  Substance and Sexual Activity  . Alcohol use: No  . Drug use: No  . Sexual activity: Never  Lifestyle  . Physical activity:    Days per week: Not on file    Minutes per session: Not on file  . Stress: Not on file  Relationships  . Social connections:    Talks on phone: Not on file    Gets together: Not on file    Attends religious service: Not on file    Active member of club or organization: Not on file    Attends meetings of clubs or organizations: Not on file    Relationship status: Not on file  . Intimate partner violence:    Fear of current or ex partner: Not on file    Emotionally abused: Not on file    Physically abused: Not on file    Forced sexual activity: Not on file  Other Topics  Concern  . 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 Married to Don [name] second wife, she is in a nursing home.    Family History:   Family History  Problem Relation Age of Onset  . Diabetes Father   . Heart disease Father        MI  . Heart attack Father   . Heart attack Mother   . Heart disease Brother   . Cancer Brother  lung  . Lung cancer Brother   . Heart disease Brother   . Lung cancer Sister   . Cancer Sister        lung  . Heart disease Brother   . Heart disease Brother      ROS:  Please see the history of present illness.  All other ROS reviewed and negative.     Physical Exam/Data:   Vitals:   03/20/18 2034 03/20/18 2130 03/21/18 0549 03/21/18 1339  BP:  (!) 159/91 (!) 153/90 (!) 148/104  Pulse:  (!) 109 91 (!) 127  Resp:  20 18 16   Temp:  97.9 F (36.6 C) 98.3 F (36.8 C) 98.5 F (36.9 C)  TempSrc:  Oral Oral Oral  SpO2: 97% 99% 96% 93%  Weight:      Height:        Intake/Output Summary (Last 24 hours) at 03/21/2018 1552 Last data filed at 03/21/2018 1201 Gross per 24 hour  Intake 1021.75 ml  Output 600 ml  Net 421.75 ml   Filed Weights   03/16/18 1941 03/18/18 1704  Weight: 162 lb (73.5 kg) 148 lb 9.4 oz (67.4 kg)   Body mass index is 21.94 kg/m.  General:  Well nourished, well developed, in no acute distress HEENT: normal Lymph: no adenopathy Neck: no JVD Endocrine:  No thryomegaly Vascular: No carotid bruits; FA pulses 2+ bilaterally without bruits  Cardiac:  Irregularly irregular Lungs:  Some expiratory wheezing Abd: soft, nontender, no hepatomegaly  Ext: no edema Musculoskeletal:  No deformities, BUE and BLE strength normal and equal Skin: warm and dry  Neuro:  CNs 2-12 intact, no focal abnormalities noted Psych:  Normal affect   EKG:  The EKG on 03/17/18 was personally reviewed and demonstrates:  AF with VR 120, IVCD   Telemetry:  Telemetry was personally reviewed  and demonstrates:  AF with VR 90-115  Relevant CV Studies: Echo 01/05/17- Study Conclusions  - Left ventricle: The cavity size was normal. Systolic function was   normal. The estimated ejection fraction was in the range of 60%   to 65%. Wall motion was normal; there were no regional wall   motion abnormalities. The study was not technically sufficient to   allow evaluation of LV diastolic dysfunction due to atrial   fibrillation. Mild concentric and moderate focal basal septal   hypertrophy. - Aortic valve: Trileaflet; mildly calcified leaflets. There was no   stenosis. - Mitral valve: Moderately calcified annulus. - Left atrium: The atrium was mildly dilated  Laboratory Data:  Chemistry Recent Labs  Lab 03/19/18 0605 03/20/18 0608 03/21/18 0638  NA 140 139 140  K 4.2 4.3 4.2  CL 111 109 110  CO2 22 22 21*  GLUCOSE 99 101* 93  BUN 44* 40* 38*  CREATININE 3.37* 3.19* 3.16*  CALCIUM 7.7* 8.1* 8.2*  GFRNONAA 16* 17* 17*  GFRAA 19* 20* 20*  ANIONGAP 7 8 9     Recent Labs  Lab 03/15/18 1902 03/16/18 2115 03/18/18 0843  PROT 6.1* 6.0* 4.8*  ALBUMIN 2.9* 2.8* 2.3*  AST 20 17 15   ALT 12* 13* 12*  ALKPHOS 98 99 70  BILITOT 0.9 1.1 1.0   Hematology Recent Labs  Lab 03/18/18 0843  03/19/18 0605 03/20/18 0608 03/21/18 0638  WBC 7.7  --  6.6 7.2 6.6  RBC 2.36*   < > 1.81* 2.97* 2.90*  HGB 7.3*  --  5.6* 8.9* 8.6*  HCT 22.3*  --  17.7* 26.7* 26.0*  MCV 94.5  --  97.8 89.9 89.7  MCH 30.9  --  30.9  --  29.7  MCHC 32.7  --  31.6 33.3 33.1  RDW 16.2*  --  15.7* 18.6* 18.2*  PLT 196  --  166 151 172   < > = values in this interval not displayed.   Cardiac Enzymes Recent Labs  Lab 03/18/18 0843 03/18/18 1813 03/19/18 0058 03/19/18 0605  TROPONINI 0.04* 0.03* 0.03* 0.03*   No results for input(s): TROPIPOC in the last 168 hours.  BNPNo results for input(s): BNP, PROBNP in the last 168 hours.  DDimer No results for input(s): DDIMER in the last 168  hours.  Radiology/Studies:  Ct Abdomen Pelvis Wo Contrast  Result Date: 03/19/2018 CLINICAL DATA:  Lung mass in LEFT lower lobe question bronchogenic carcinoma, history prostate cancer, prior retropubic prostatectomy, tubular adenoma of the colon, stage III chronic kidney disease, COPD, atrial fibrillation, hypertension EXAM: CT ABDOMEN AND PELVIS WITHOUT CONTRAST TECHNIQUE: Multidetector CT imaging of the abdomen and pelvis was performed following the standard protocol without IV contrast. Sagittal and coronal MPR images reconstructed from axial data set. Patient drank dilute oral contrast for exam. COMPARISON:  None; correlation PET-CT 02/21/2018 FINDINGS: Lower chest: Lobulated LEFT lower lobe mass with slightly spiculated margins 3.0 x 2.6 x 2.5 cm corresponding to FDG avid tumor on prior PET-CT. Small bibasilar pleural effusions and bibasilar atelectasis. Underlying emphysematous changes. Hepatobiliary: Calcified gallstones within a mildly distended gallbladder. Liver unremarkable. No biliary dilatation. Pancreas: Pancreatic atrophy. Duodenal diverticulum at pancreatic head. Spleen: No focal mass.  Small calcified granuloma. Adrenals/Urinary Tract: Low-attenuation BILATERAL enlargement of the adrenal glands question adenomas versus marked hyperplasia, appearance stable versus prior PET-CT; these demonstrated no abnormal FDG accumulation. Upper pole RIGHT renal cyst unchanged. Kidneys, ureters and bladder otherwise unremarkable. Stomach/Bowel: Appendicular LEFT within the appendix. Scattered stool and tiny radiopacities in the colon. Stomach and bowel loops otherwise unremarkable. Vascular/Lymphatic: Extensive atherosclerotic calcifications aorta and iliac arteries. Low-attenuation of circulating blood question anemia. Enlargement of cardiac chambers. Mitral annular calcification. Few normal sized retroperitoneal and pelvic lymph nodes. No adenopathy. Reproductive: Prostate gland surgically absent Other: No  free air or free fluid. No significant hernias. No acute inflammatory process. Musculoskeletal: No acute osseous findings. 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 Bibasilar atelectasis and small pleural effusions. Question BILATERAL adrenal adenomas versus marked hyperplasia slightly greater on LEFT, demonstrating no abnormal FDG accumulation on PET-CT. Cholelithiasis. No definite intra-abdominal or intrapelvic metastases identified. Electronically Signed   By: Lavonia Dana M.D.   On: 03/19/2018 16:14   Dg Chest Port 1 View  Result Date: 03/19/2018 CLINICAL DATA:  77 year old male with hypoxia. Subsequent encounter. EXAM: PORTABLE CHEST 1 VIEW COMPARISON:  03/15/2018 chest x-ray. 02/21/2018 PET-CT. 02/04/2018 chest CT. FINDINGS: Mild pulmonary vascular congestion. Mid right lung subsegmental atelectasis. Retrocardiac mass not as well delineated on present plain film exam. Heart size within normal limits. Aortic calcification. IMPRESSION: Mild pulmonary vascular congestion. Mid right lung subsegmental atelectasis. Retrocardiac mass not well delineated on present plain film exam. Aortic Atherosclerosis (ICD10-I70.0). Electronically Signed   By: Genia Del M.D.   On: 03/19/2018 18:14    Assessment and Plan:   Pre op cardiac clearance- Pt appears to be an acceptable risk for GI work from a cardiac standpoint. We will follow peri op.   CAF- Rate appropriate for Don [name] current condition  Acute GI bleed- life threatening bleed requiring transfusion. Pt is CHADS VASC=2 for age. We may need to reconsider Warfarin Rx depending on GI  work up. Don [name] INR was > 6 on admission.   Depression- Felt to be severe-requiring in patient admission to psychiatric facility when stable.   CRI-4 GFR 20  COPD- Don [name] is on Zebeta. Consider Xopenex HHN PRN- will defer to primary service.    Plan: Dr Harl Bowie to see- further recommendations to follow.   For questions or updates, please contact [place] Please consult [email or web address] for contact info under Cardiology/STEMI.   Signed, Kerin Ransom, PA-C  03/21/2018 3:52 PM   Attending note Patient seen and discussed with PA Rosalyn Gess, I agree with Don [name] documentation above. History of afib, mild LV systolic dysfunction, COPD, HTN, hyperlipidemia, suspected CAD nuclear stress 2015 with inferolateral infarct without current ischemia, admited with afib with RVR, suicidal ideations, and anemia.  From notes rates improved with IVFs, and restarting Don [name] home bisoprolol and diltlaizem. Hgb dropped as low as 5.6 Don [name] admission, required tranfusion, plans for GI evaluation. Elevated INR, given vit K and coumadin stopped. In setting of severe anemia and tachycardia very mild flat troponon 0.03-0.04, not consistent with ACS. No further ischemic testing planned. With supratherapeutic INR and recent afib guidelines update would consider changing to eliquis 5mg  bid once ok to restart from GI standpoint.  Rates elevated, increase dilt to 240mg , extra 60mg  daily tonight  Ok to proceed with GI testing.    Carlyle Dolly MD

## 2018-03-22 ENCOUNTER — Encounter (HOSPITAL_COMMUNITY)
Admission: EM | Disposition: A | Discharge status: Skilled Nursing Facility | Payer: Self-pay | Source: Home / Self Care | Attending: Internal Medicine

## 2018-03-22 ENCOUNTER — Encounter (HOSPITAL_COMMUNITY): Payer: Self-pay | Admitting: *Deleted

## 2018-03-22 DIAGNOSIS — K3189 Other diseases of stomach and duodenum: Secondary | ICD-10-CM

## 2018-03-22 HISTORY — PX: GIVENS CAPSULE STUDY: SHX5432

## 2018-03-22 HISTORY — PX: ESOPHAGOGASTRODUODENOSCOPY: SHX5428

## 2018-03-22 LAB — CBC
HCT: 25.8 % — ABNORMAL LOW (ref 39.0–52.0)
HEMOGLOBIN: 8.6 g/dL — AB (ref 13.0–17.0)
MCH: 29.9 pg (ref 26.0–34.0)
MCHC: 33.3 g/dL (ref 30.0–36.0)
MCV: 89.6 fL (ref 78.0–100.0)
PLATELETS: 171 10*3/uL (ref 150–400)
RBC: 2.88 MIL/uL — AB (ref 4.22–5.81)
RDW: 18.1 % — ABNORMAL HIGH (ref 11.5–15.5)
WBC: 5.8 10*3/uL (ref 4.0–10.5)

## 2018-03-22 LAB — BASIC METABOLIC PANEL
Anion gap: 8 (ref 5–15)
BUN: 34 mg/dL — AB (ref 6–20)
CHLORIDE: 111 mmol/L (ref 101–111)
CO2: 21 mmol/L — ABNORMAL LOW (ref 22–32)
Calcium: 8.2 mg/dL — ABNORMAL LOW (ref 8.9–10.3)
Creatinine, Ser: 3.06 mg/dL — ABNORMAL HIGH (ref 0.61–1.24)
GFR, EST AFRICAN AMERICAN: 21 mL/min — AB (ref >60)
GFR, EST NON AFRICAN AMERICAN: 18 mL/min — AB (ref >60)
Glucose, Bld: 84 mg/dL (ref 65–99)
POTASSIUM: 4.2 mmol/L (ref 3.5–5.1)
SODIUM: 140 mmol/L (ref 135–145)

## 2018-03-22 SURGERY — ESOPHAGOGASTRODUODENOSCOPY (EGD) WITH PROPOFOL
Anesthesia: Choice

## 2018-03-22 SURGERY — EGD (ESOPHAGOGASTRODUODENOSCOPY)
Anesthesia: Moderate Sedation

## 2018-03-22 MED ORDER — HYDRALAZINE HCL 20 MG/ML IJ SOLN: 5.0000 mg | Freq: Four times a day (QID) | INTRAMUSCULAR | Status: DC | PRN

## 2018-03-22 MED ORDER — MIDAZOLAM HCL 5 MG/5ML IJ SOLN
INTRAMUSCULAR | Status: AC
  Filled 2018-03-22: qty 5

## 2018-03-22 MED ORDER — MIDAZOLAM HCL 5 MG/5ML IJ SOLN
INTRAMUSCULAR | Status: DC | PRN
  Administered 2018-03-22: 1 mg via INTRAVENOUS

## 2018-03-22 MED ORDER — LIDOCAINE VISCOUS 2 % MT SOLN
OROMUCOSAL | Status: AC
  Filled 2018-03-22: qty 15

## 2018-03-22 MED ORDER — MEPERIDINE HCL 50 MG/ML IJ SOLN
INTRAMUSCULAR | Status: AC
  Filled 2018-03-22: qty 1

## 2018-03-22 MED ORDER — ORAL CARE MOUTH RINSE
15.0000 mL | Freq: Two times a day (BID) | OROMUCOSAL | Status: DC
  Administered 2018-03-22 – 2018-03-29 (×11): 15 mL via OROMUCOSAL

## 2018-03-22 MED ORDER — PANTOPRAZOLE SODIUM 40 MG PO TBEC
40.0000 mg | DELAYED_RELEASE_TABLET | Freq: Every day | ORAL | Status: DC
  Administered 2018-03-23 – 2018-03-29 (×7): 40 mg via ORAL
  Filled 2018-03-22 (×8): qty 1

## 2018-03-22 MED ORDER — ONDANSETRON HCL 4 MG/2ML IJ SOLN
4.0000 mg | Freq: Once | INTRAMUSCULAR | Status: AC
  Administered 2018-03-22: 4 mg via INTRAVENOUS

## 2018-03-22 MED ORDER — ONDANSETRON HCL 4 MG/2ML IJ SOLN
INTRAMUSCULAR | Status: AC
  Filled 2018-03-22: qty 2

## 2018-03-22 MED ORDER — LIDOCAINE VISCOUS 2 % MT SOLN
OROMUCOSAL | Status: DC | PRN
  Administered 2018-03-22: 1 via OROMUCOSAL

## 2018-03-22 MED ORDER — SODIUM CHLORIDE 0.9 % IV SOLN
INTRAVENOUS | Status: DC
  Administered 2018-03-22: 10:00:00 via INTRAVENOUS

## 2018-03-22 MED ORDER — STERILE WATER FOR IRRIGATION IR SOLN
Status: DC | PRN
  Administered 2018-03-22: 10:00:00

## 2018-03-22 NOTE — Progress Notes (Addendum)
Progress Note  Patient Name: Don [name] Date of Encounter: 03/22/2018  Primary Cardiologist: No primary care provider on file.   Subjective   letyhargic after endoscopy  Inpatient Medications    Scheduled Meds: . bisoprolol  10 mg Oral Daily  . calcitRIOL  0.25 mcg Oral Daily  . diltiazem  240 mg Oral Daily  . lidocaine      . LORazepam  0.5 mg Oral QHS  . mouth rinse  15 mL Mouth Rinse BID  . midazolam      . ondansetron      . [START ON 03/23/2018] pantoprazole  40 mg Oral Daily  . terazosin  2 mg Oral QHS   Continuous Infusions: . sodium chloride 75 mL/hr at 03/21/18 1201   PRN Meds: acetaminophen **OR** acetaminophen, ondansetron **OR** ondansetron (ZOFRAN) IV, senna-docusate   Vital Signs    Vitals:   03/22/18 1025 03/22/18 1030 03/22/18 1035 03/22/18 1100  BP:  (!) 153/85 (!) 150/104 (!) 171/81  Pulse: (!) 116 (!) 124 (!) 112 63  Resp: (!) 24 (!) 22 (!) 22 (!) 22  Temp:      TempSrc:      SpO2: 94% (!) 89% 91% 100%  Weight:      Height:        Intake/Output Summary (Last 24 hours) at 03/22/2018 1112 Last data filed at 03/22/2018 0700 Gross per 24 hour  Intake 1675 ml  Output 450 ml  Net 1225 ml   Filed Weights   03/16/18 1941 03/18/18 1704  Weight: 162 lb (73.5 kg) 148 lb 9.4 oz (67.4 kg)    Telemetry    AF with VR 100-115- Personally Reviewed  ECG      Physical Exam   GEN: No acute distress.   Neck: No JVD Cardiac: irregularly irregular  Respiratory: mostly clear, few rhonchi, no wheezing GI: Soft, nontender, non-distended  MS: No edema; No deformity. Neuro:  Nonfocal  Psych: Normal affect   Labs    Chemistry Recent Labs  Lab 03/15/18 1902 03/16/18 2115 03/18/18 0843  03/20/18 0608 03/21/18 0638 03/22/18 0651  NA 142 144 140   < > 139 140 140  K 4.4 4.7 4.1   < > 4.3 4.2 4.2  CL 109 111 109   < > 109 110 111  CO2 22 25 22    < > 22 21* 21*  GLUCOSE 98 122* 92   < > 101* 93 84  BUN 41* 41* 43*   < > 40* 38* 34*    CREATININE 3.57* 3.30* 3.32*   < > 3.19* 3.16* 3.06*  CALCIUM 8.7* 8.9 8.1*   < > 8.1* 8.2* 8.2*  PROT 6.1* 6.0* 4.8*  --   --   --   --   ALBUMIN 2.9* 2.8* 2.3*  --   --   --   --   AST 20 17 15   --   --   --   --   ALT 12* 13* 12*  --   --   --   --   ALKPHOS 98 99 70  --   --   --   --   BILITOT 0.9 1.1 1.0  --   --   --   --   GFRNONAA 15* 17* 16*   < > 17* 17* 18*  GFRAA 17* 19* 19*   < > 20* 20* 21*  ANIONGAP 11 8 9    < > 8 9 8    < > =  values in this interval not displayed.     Hematology Recent Labs  Lab 03/19/18 [phone] 03/20/18 7989 03/21/18 0638 03/22/18 0651  WBC 6.6 7.2 6.6 5.8  RBC 1.81* 2.97* 2.90* 2.88*  HGB 5.6* 8.9* 8.6* 8.6*  HCT 17.7* 26.7* 26.0* 25.8*  MCV 97.8 89.9 89.7 89.6  MCH 30.9  --  29.7 29.9  MCHC 31.6 33.3 33.1 33.3  RDW 15.7* 18.6* 18.2* 18.1*  PLT 166 151 172 171    Cardiac Enzymes Recent Labs  Lab 03/18/18 0843 03/18/18 1813 03/19/18 0058 03/19/18 0605  TROPONINI 0.04* 0.03* 0.03* 0.03*   No results for input(s): TROPIPOC in the last 168 hours.   BNPNo results for input(s): BNP, PROBNP in the last 168 hours.   DDimer No results for input(s): DDIMER in the last 168 hours.   Radiology    No results found.  Cardiac Studies    Patient Profile     77 y.o. male seen by cardiology in the past for mild CM and CAF. He has been maintaned on Coumadin and rate control. His last echo in Feb 2018 showed his EF had normalized. He was admitted through the ED 03/15/18 with depression and suicidal ideations. There were no psychiatric beds and he was being held in the ED. Unfortunately he wasn't eating or drinking, or getting his medications. His INR on admission was 6.48, his Hgb 9.5. On 03/18/18 he was noted to be hypotensive and in rapid AF. His Hgb dropped to 5.6 with positive stool guaiac. He was transfused and he was seen by Dr Harl Bowie for pre op GI work up 03/21/18.    Assessment & Plan    CAF- Rate appropriate for his current  condition  Acute GI bleed- life threatening bleed requiring transfusion. Pt is CHADS VASC=2 for age. We may need to reconsider Warfarin Rx depending on GI work up. His INR was > 6 on admission.   Depression- Felt to be severe-requiring in patient admission to psychiatric facility when stable.   CRI-4 GFR 20  COPD- He is on Zebeta. Consider Xopenex HHN PRN- will defer to primary service.   HTN- B/P running high. Diltiazem increased yesterday. Will order Hydralazine PRN and follow his B/P as po medications resumed.   Plan: Preliminary endoscopy report shows gastritis (per RN). If anticoagulation resumed consider Eliquis 5 mg BID- GFR= 20, age 59, wgt  67 kg.   For questions or updates, please contact [place] Please consult [email or web address] for contact info under Cardiology/STEMI.      Signed, Kerin Ransom, PA-C  03/22/2018, 11:12 AM    Attending note Patient seen and discussed with PA Rosalyn Gess, I agree with his documentation. I increased his dilt yesterday to 240mg  daily due to elevated rates and afib. Rates still elevated, note meds just given at 11AM. Start eliquis 5mg  bid when ok with GI. I would not be overally aggressive with his rate control given his anemia and blood loss, conservative goal around 110 or less is reasonable and validated by the literature. Continue current doses today, room to titrate dilt further if persistenly elevated.    Carlyle Dolly MD

## 2018-03-22 NOTE — Progress Notes (Signed)
Pt going down to Endo

## 2018-03-22 NOTE — Evaluation (Signed)
Physical Therapy Evaluation Patient Details Name: Don [name] MRN: [id number] DOB: 15-May-1941 Today's Date: 03/22/2018   History of Present Illness  Don [name] is a 77 y.o. male with history significant for atrial fibrillation, hypertension, hyperlipidemia, stage III chronic kidney disease, history of prostate cancer who initially presented to the [facility] emergency department on 5/4 with complaints of feeling depressed and suicidal with an actual suicide plan.  He was evaluated by TTS and it was decided that he needed inpatient psychiatric care.  He has been in the emergency department the past 2-1/2 days awaiting psychiatric placement.  Unfortunately it appears that he did not receive any fluids, his home medications were not entirely restarted and he has not been eating or drinking well since presentation.  When EDP today went to assess patient, patient was noticed to have a heart rate anywhere from 1 30-1 50, looked very pale.  Labs were drawn which showed a hemoglobin of 7.3, was 10.2 upon presentation on the fourth, he did perform a rectal exam, stool was brown in color and was only faintly positive for blood.  On blood work his creatinine is 3.32 which is around his baseline, he has stage IV chronic kidney disease, otherwise labs are within normal limits  Clinical Impression  PT agreeable to therapy.  He has not at lunch yet.  Pt slightly unsteady on his feet but states that he has not been up.  Pt refuses an assistive device.     Follow Up Recommendations No PT follow up;Outpatient PT    Equipment Recommendations  Cane    Recommendations for Other Services       Precautions / Restrictions Precautions Precautions: None Restrictions Weight Bearing Restrictions: No      Mobility  Bed Mobility Overal bed mobility: Modified Independent                Transfers Overall transfer level: Modified independent Equipment used: None                 Ambulation/Gait Ambulation/Gait assistance: Min guard Ambulation Distance (Feet): 60 Feet Assistive device: None       General Gait Details: PT slightly unsteady on feet but will not use an assistive device states he knows his capabilities and just has not been up. PT ambulates holding onto the handrail of the hall.            Pertinent Vitals/Pain Pain Assessment: No/denies pain    Home Living Family/patient expects to be discharged to:: Private residence Living Arrangements: Alone   Type of Home: House Home Access: Stairs to enter   Technical brewer of Steps: 3 Home Layout: One level Home Equipment: None      Prior Function    I lives at home alone                Extremity/Trunk Assessment        Lower Extremity Assessment Lower Extremity Assessment: Overall WFL for tasks assessed       Communication    I  Cognition Arousal/Alertness: Awake/alert Behavior During Therapy: WFL for tasks assessed/performed Overall Cognitive Status: Within Functional Limits for tasks assessed                                               Assessment/Plan    PT Assessment Patient needs continued PT services  PT Problem List Decreased strength;Decreased balance       PT Treatment Interventions Gait training;Therapeutic activities;Stair training;Therapeutic exercise    PT Goals (Current goals can be found in the Care Plan section)  Acute Rehab PT Goals Patient Stated Goal: PT to be steady walking for five minutes with least assistive device.  PT Goal Formulation: With patient Time For Goal Achievement: 03/25/18 Potential to Achieve Goals: Good    Frequency Min 4X/week   Barriers to discharge              End of Session Equipment Utilized During Treatment: Gait belt Activity Tolerance: Patient tolerated treatment well Patient left: in bed;with call bell/phone within reach;with bed alarm set Nurse Communication: Mobility  status PT Visit Diagnosis: Unsteadiness on feet (R26.81)    Time: 1610-9604 PT Time Calculation (min) (ACUTE ONLY): 39 min   Charges:   PT Evaluation $PT Eval Low Complexity: [place], PT CLT [phone] 03/22/2018, 3:39 PM

## 2018-03-22 NOTE — Care Management Important Message (Signed)
Important Message  Patient Details  Name: Don [name] MRN: [id number] Date of Birth: [birth year]   Medicare Important Message Given:  Yes    Sherald Barge, RN 03/22/2018, 10:17 AM

## 2018-03-22 NOTE — Clinical Social Work Note (Signed)
Patient's step son, [name], states that patient wants that "joys of ALF, but does not want to go."  Patient was signed up at the [facility] center and only went one time as well as other programs. He stated that patient says he wants things but won't follow through. LCSW discussed the financial portion of ALF. [name] stated that patient did not have the financial resources for ALF nor did he have Medicaid.  [name] stated that he would contact Kathalene Frames, Medicaid wofker at [place]. He stated that the plan would be for patient to return to home with HHPT services at this time. [name] also wanted to discuss patient's previous fall on 03/16/18. LCSW informed [name] that she had limited details of the actual fall.    Krosby Ritchie, Clydene Pugh, LCSW

## 2018-03-22 NOTE — Progress Notes (Signed)
PROGRESS NOTE    Don [name]  ZOX:[id number] DOB: [birth year] DOA: 03/16/2018 PCP: Dettinger, Fransisca Kaufmann, MD     Brief Narrative:  77 year old man admitted on 5/6.  He had been awaiting emergent psychiatric placement due to major depression with suicidal ideation with an active suicide plan.  When he was evaluated by ED physician on day of admission he was noted to be pale, weak with a hemoglobin of 7.3.  As of 5/7 his hemoglobin has further dropped to 5.6, FOBT is positive.  He is now status post 2 unit PRBCs with good stability noted.  GI performed EGD with findings of some gastritis with erosions and some biopsies have been obtained.   Assessment & Plan:   Principal Problem:   Atrial fibrillation with RVR (HCC) Active Problems:   Hypertension   Heme positive stool   Stage III chronic kidney disease (HCC)   Hyperlipidemia LDL goal <130   Normocytic anemia   Suicidal ideation   Major depression   Palliative care by specialist   Goals of care, counseling/discussion   DNR (do not resuscitate) discussion   Atrial fibrillation with rapid ventricular response-resolved -I suspect RVR due to a combination of blood loss/dehydration in addition to his rate controlling medications being held.  -Continue bisoprolol and Cardizem added increased dosed per cardiology. -Consider initiation of Eliquis 5 mg twice daily if hemoglobin and hematocrit remained stable by tomorrow per GI.  Normocytic anemia-improved s/p 2U PRBC transfusion; stable -Definitely a chronic component due to chronic kidney disease but do suspect an acute blood loss anemia as well especially given rapidity of decreased hemoglobin in addition to positive fecal occult blood. -Supratherapeutic INR has normalized after vitamin K administration CT of the abdomen and pelvis with no retroperitoneal bleed noted -Appreciate GI evaluation with plans for EGD once stable from cardiopulmonary standpoint -Appreciate palliative care  consultation  Stage IV CKD -At baseline his Cr is between 2.3-2.5. -Remains at baseline. -Needs OP follow up with nephrology as he is close to needing HD. -No acute indications for HD.  Hyperlipidemia -Continue statin.  Acute Hypoxemic Respiratory Failure-improved -Chest x-ray with no acute findings, he is currently on room air.  Suspect this is secondary to severe anemia.  Depression with Suicidal Ideation -Cleared by TTS for discharge to assisted living facility -No further ideations currently noted   DVT prophylaxis: SCDs Code Status: full code Family Communication: patient only Disposition Plan: Plan for discharge by a.m. to assisted living facility versus home with home health depending on insurance issues. Consultants: GI and cardiology  GI  TTS  Cardiology  Procedures:   none  Antimicrobials:  Anti-infectives (From admission, onward)   Start     Dose/Rate Route Frequency Ordered Stop   03/16/18 2200  cephALEXin (KEFLEX) capsule 500 mg     500 mg Oral  Once 03/16/18 2147 03/16/18 2158   03/16/18 0000  cephALEXin (KEFLEX) 500 MG capsule     500 mg Oral 3 times daily 03/16/18 2237         Subjective: Patient seen and evaluated this morning.  He denies any particular complaints or concerns this morning.  Objective: Vitals:   03/22/18 1030 03/22/18 1035 03/22/18 1100 03/22/18 1112  BP: (!) 153/85 (!) 150/104 (!) 171/81 (!) 150/105  Pulse: (!) 124 (!) 112 63   Resp: (!) 22 (!) 22 (!) 22   Temp:      TempSrc:      SpO2: (!) 89% 91% 100%   Weight:  Height:        Intake/Output Summary (Last 24 hours) at 03/22/2018 1134 Last data filed at 03/22/2018 0700 Gross per 24 hour  Intake 1675 ml  Output 450 ml  Net 1225 ml   Filed Weights   03/16/18 1941 03/18/18 1704  Weight: 73.5 kg (162 lb) 67.4 kg (148 lb 9.4 oz)    Examination:  General exam: drowsy but can answer uncomplicated questions., pale Respiratory system: Clear to auscultation.  Respiratory effort normal. Cardiovascular system:RRR. No murmurs, rubs, gallops. Gastrointestinal system: Abdomen is nondistended, soft and nontender. No organomegaly or masses felt. Normal bowel sounds heard. Central nervous system: Non focal Extremities: No C/C/E, +pedal pulses Skin: No rashes, lesions or ulcers Psychiatry: Unable to fully assess given current mental state.    Data Reviewed: I have personally reviewed following labs and imaging studies  CBC: Recent Labs  Lab 03/15/18 1902 03/16/18 2115  03/17/18 1410 03/18/18 0843 03/19/18 0605 03/20/18 0608 03/21/18 0638 03/22/18 0651  WBC 7.4 11.8*   < > 10.8* 7.7 6.6 7.2 6.6 5.8  NEUTROABS 5.4 9.5*  --  7.7 5.6  --   --   --   --   HGB 9.5* 10.2*   < > 8.4* 7.3* 5.6* 8.9* 8.6* 8.6*  HCT 28.8* 30.7*   < > 25.7* 22.3* 17.7* 26.7* 26.0* 25.8*  MCV 95.7 95.3   < > 94.5 94.5 97.8 89.9 89.7 89.6  PLT 222 249   < > 246 196 166 151 172 171   < > = values in this interval not displayed.   Basic Metabolic Panel: Recent Labs  Lab 03/18/18 0843 03/19/18 0605 03/20/18 0608 03/21/18 0638 03/22/18 0651  NA 140 140 139 140 140  K 4.1 4.2 4.3 4.2 4.2  CL 109 111 109 110 111  CO2 22 22 22  21* 21*  GLUCOSE 92 99 101* 93 84  BUN 43* 44* 40* 38* 34*  CREATININE 3.32* 3.37* 3.19* 3.16* 3.06*  CALCIUM 8.1* 7.7* 8.1* 8.2* 8.2*   GFR: Estimated Creatinine Clearance: 19 mL/min (A) (by C-G formula based on SCr of 3.06 mg/dL (H)). Liver Function Tests: Recent Labs  Lab 03/15/18 1902 03/16/18 2115 03/18/18 0843  AST 20 17 15   ALT 12* 13* 12*  ALKPHOS 98 99 70  BILITOT 0.9 1.1 1.0  PROT 6.1* 6.0* 4.8*  ALBUMIN 2.9* 2.8* 2.3*   No results for input(s): LIPASE, AMYLASE in the last 168 hours. No results for input(s): AMMONIA in the last 168 hours. Coagulation Profile: Recent Labs  Lab 03/15/18 1902 03/16/18 2115 03/17/18 1410 03/19/18 1210  INR 6.48* 5.90* 2.66 1.30   Cardiac Enzymes: Recent Labs  Lab 03/15/18 1902  03/18/18 0843 03/18/18 1813 03/19/18 0058 03/19/18 0605  CKTOTAL 37*  --   --   --   --   TROPONINI  --  0.04* 0.03* 0.03* 0.03*   BNP (last 3 results) No results for input(s): PROBNP in the last 8760 hours. HbA1C: No results for input(s): HGBA1C in the last 72 hours. CBG: Recent Labs  Lab 03/15/18 1916  GLUCAP 103*   Lipid Profile: No results for input(s): CHOL, HDL, LDLCALC, TRIG, CHOLHDL, LDLDIRECT in the last 72 hours. Thyroid Function Tests: No results for input(s): TSH, T4TOTAL, FREET4, T3FREE, THYROIDAB in the last 72 hours. Anemia Panel: No results for input(s): VITAMINB12, FOLATE, FERRITIN, TIBC, IRON, RETICCTPCT in the last 72 hours. Urine analysis:    Component Value Date/Time   COLORURINE YELLOW 03/15/2018 2110   APPEARANCEUR  HAZY (A) 03/15/2018 2110   LABSPEC 1.013 03/15/2018 2110   PHURINE 5.0 03/15/2018 2110   GLUCOSEU NEGATIVE 03/15/2018 2110   HGBUR NEGATIVE 03/15/2018 2110   BILIRUBINUR NEGATIVE 03/15/2018 2110   BILIRUBINUR neg 10/07/2014 1122   KETONESUR NEGATIVE 03/15/2018 2110   PROTEINUR 100 (A) 03/15/2018 2110   UROBILINOGEN negative 10/07/2014 1122   UROBILINOGEN 0.2 04/03/2014 2308   NITRITE NEGATIVE 03/15/2018 2110   LEUKOCYTESUR NEGATIVE 03/15/2018 2110   Sepsis Labs: @LABRCNTIP (procalcitonin:4,lacticidven:4)  )No results found for this or any previous visit (from the past 240 hour(s)).    Radiology Studies: No results found.   Scheduled Meds: . bisoprolol  10 mg Oral Daily  . calcitRIOL  0.25 mcg Oral Daily  . diltiazem  240 mg Oral Daily  . lidocaine      . LORazepam  0.5 mg Oral QHS  . mouth rinse  15 mL Mouth Rinse BID  . midazolam      . ondansetron      . [START ON 03/23/2018] pantoprazole  40 mg Oral Daily  . terazosin  2 mg Oral QHS   Continuous Infusions: . sodium chloride 75 mL/hr at 03/21/18 1201     LOS: 4 days    Time spent: 35 minutes. Greater than 50% of this time was spent in direct contact with the  patient, coordinating care and discussing relevant ongoing clinical issues, including anemia, potential causes and poor overall prognosis.     Berenize Gatlin Darleen Crocker, DO [facility] Pager [phone]  If 7PM-7AM, please contact night-coverage [email or web address] Password [facility] 03/22/2018, 11:34 AM

## 2018-03-22 NOTE — Progress Notes (Signed)
Patient seen in endoscopy. Hemodynamically stable. Cardiology input reviewed and appreciated. Plan for EGD today with possible video capsule placement.risks/benefits reviewed. Further recommendations to follow.

## 2018-03-22 NOTE — Op Note (Signed)
[facility] Patient Name: Don [name] Procedure Date: 03/22/2018 9:43 AM MRN: [id number] Date of Birth: 10-02-1941 Attending MD: Norvel Richards , MD CSN: [id number] Age: 77 Admit Type: Inpatient Procedure:                Upper GI endoscopy Indications:              Heme positive stool Providers:                Norvel Richards, MD, Lurline Del, RN, Aram Candela Referring MD:              Medicines:                Meperidine 0 mg IV, Midazolam 1 mg IV, Ondansetron                            4 mg IV Complications:            No immediate complications. Estimated Blood Loss:     Estimated blood loss was minimal. Procedure:                Pre-Anesthesia Assessment:                           - Prior to the procedure, a History and Physical                            was performed, and patient medications and                            allergies were reviewed. [name] tolerance of                            previous anesthesia was also reviewed. The risks                            and benefits of the procedure and the sedation                            options and risks were discussed with the patient.                            All questions were answered, and informed consent                            was obtained. Prior Anticoagulants: The patient has                            taken no previous anticoagulant or antiplatelet                            agents. ASA Grade Assessment: III - A patient with  severe systemic disease. After reviewing the risks                            and benefits, the patient was deemed in                            satisfactory condition to undergo the procedure.                           After obtaining informed consent, the endoscope was                            passed under direct vision. Throughout the                            procedure, the patient's blood pressure, pulse,  and                            oxygen saturations were monitored continuously. The                            [id number] ([id number]) scope was introduced through the                            mouth, and advanced to the second part of duodenum.                            The upper GI endoscopy was accomplished without                            difficulty. The patient tolerated the procedure                            well. Scope In: 10:10:17 AM Scope Out: 10:18:18 AM Total Procedure Duration: 0 hours 8 minutes 1 second  Findings:      The examined esophagus was normal.      Erythematous mucosa was found in the entire examined stomach.      Multiple erosions were found in the entire examined stomach. No ulcer or       infiltrating process seen. This was biopsied with a cold forceps for       histology. Estimated blood loss was minimal.      The duodenal bulb and second portion of the duodenum were normal. Impression:               - Normal esophagus.                           - Erythematous mucosa in the stomach.                           - Erosive gastropathy. Biopsied.                           - Normal duodenal bulb and second portion of the  duodenum. No small bowel video capsule deployed                            today. Moderate Sedation:      Moderate (conscious) sedation was administered by the endoscopy nurse       and supervised by the endoscopist. The following parameters were       monitored: oxygen saturation, heart rate, blood pressure, respiratory       rate, EKG, adequacy of pulmonary ventilation, and response to care.       Total physician intraservice time was 12 minutes. Recommendation:           - Patient has a contact number available for                            emergencies. The signs and symptoms of potential                            delayed complications were discussed with the                            patient. Return to normal  activities tomorrow.                            Written discharge instructions were provided to the                            patient.                           - Advance diet as tolerated.                           - Continue present medications. The risk of                            thromboembolic disease in the setting of atrial                            fibrillation likely continues to be greater than                            the risk of life-threatenting GI bleeding. As                            discussed Dr. Manuella Ghazi, would go along with beginning                            Eliquis tomorrow, watching clinically for recurrent                            bleeding. If patient develops signs/symptoms of                            ongoing bleeding then further GI evaluation would  be warranted.                           - No repeat upper endoscopy planned. Once daily PPI                            therapy should suffice in this setting. Patient                            should avoid any nonsteroidal agents as an                            outpatient going forward.                           - Return to GI office (date not yet determined). Procedure Code(s):        --- Professional ---                           [phone], Esophagogastroduodenoscopy, flexible,                            transoral; with biopsy, single or multiple                           G0500, Moderate sedation services provided by the                            same physician or other qualified health care                            professional performing a gastrointestinal                            endoscopic service that sedation supports,                            requiring the presence of an independent trained                            observer to assist in the monitoring of the                            patient's level of consciousness and physiological                            status;  initial 15 minutes of intra-service time;                            patient age 61 years or older (additional time may                            be reported with [phone], as appropriate) Diagnosis Code(s):        --- Professional ---  K31.89, Other diseases of stomach and duodenum                           R19.5, Other fecal abnormalities CPT copyright 2017 American Medical Association. All rights reserved. The codes documented in this report are preliminary and upon coder review may  be revised to meet current compliance requirements. Cristopher Estimable. Gwendloyn Forsee, MD Norvel Richards, MD 03/22/2018 11:22:47 AM This report has been signed electronically. Number of Addenda: 0

## 2018-03-22 NOTE — Care Management Note (Addendum)
Case Management Note  Patient Details  Name: ANEUDY CHAMPLAIN MRN: [id number] Date of Birth: 1941-10-27  Subjective/Objective:    Admitted with A-fib after being in ED waiting for gero psych placement. Pt very irritated today. Knew name, DOB and year but could not explain where he was at. He would not answer questions directly and kept saying we are "holding him hostage", he "deserves a good life". He has no been bathed or "had clean clothes in weeks"  He says that "[name]" (his POA) wont give him a good life. He is unable to drive because [name] will not let him have his eyes fixed. When asked about his living situation he tells this CM "it's none of your business". He does not believe we want to help him. When "going home" was mentioned he asked what home would he be going to but can not ellaborate on what he means. He expresses extreme frustration with [name], he is very upset about having his gun removed from the home. Pt can not tell me if he has gotten out of the bed since he has been here. He is fixated on bathing.                Action/Plan: CM has requested that staff get pt out of bed and into the shower. Hopefully pt will be more open to conversation after a bath. CM made request for PT eval. Pt unable to  Go to ALF d/t financial situation but not sure if it is safe at this time for pt to return home. It is very obvious pt is not thinking clearly. CM attempted to call [name] with no answer. Per CSW's note [name] is aware pt will be returning home with [facility]. As pt is unwilling and [name] is unavailable referral will be sent to [facility] in [place] for weekend DC.    Expected Discharge Date:     03/23/18             Expected Discharge Plan:  [place]  In-House Referral:  NA  Discharge planning Services  CM Consult  Post Acute Care Choice:  Home Health Choice offered to:    NA  DME Arranged:    DME Agency:     HH Arranged:    RN, PT HH Agency:    [facility]  Status of  Service:  In process, will continue to follow  If discussed at Long Length of Stay Meetings, dates discussed:    Additional Comments:  Sherald Barge, RN 03/22/2018, 1:33 PM

## 2018-03-23 DIAGNOSIS — I4891 Unspecified atrial fibrillation: Secondary | ICD-10-CM

## 2018-03-23 LAB — CBC
HEMATOCRIT: 27.3 % — AB (ref 39.0–52.0)
Hemoglobin: 8.8 g/dL — ABNORMAL LOW (ref 13.0–17.0)
MCH: 29.6 pg (ref 26.0–34.0)
MCHC: 32.2 g/dL (ref 30.0–36.0)
MCV: 91.9 fL (ref 78.0–100.0)
PLATELETS: 204 10*3/uL (ref 150–400)
RBC: 2.97 MIL/uL — ABNORMAL LOW (ref 4.22–5.81)
RDW: 18.5 % — AB (ref 11.5–15.5)
WBC: 6.5 10*3/uL (ref 4.0–10.5)

## 2018-03-23 LAB — BASIC METABOLIC PANEL
Anion gap: 7 (ref 5–15)
BUN: 32 mg/dL — AB (ref 6–20)
CO2: 21 mmol/L — ABNORMAL LOW (ref 22–32)
CREATININE: 2.91 mg/dL — AB (ref 0.61–1.24)
Calcium: 8.2 mg/dL — ABNORMAL LOW (ref 8.9–10.3)
Chloride: 115 mmol/L — ABNORMAL HIGH (ref 101–111)
GFR calc Af Amer: 22 mL/min — ABNORMAL LOW (ref >60)
GFR, EST NON AFRICAN AMERICAN: 19 mL/min — AB (ref >60)
GLUCOSE: 79 mg/dL (ref 65–99)
POTASSIUM: 4.3 mmol/L (ref 3.5–5.1)
SODIUM: 143 mmol/L (ref 135–145)

## 2018-03-23 MED ORDER — APIXABAN 5 MG PO TABS: 5.0000 mg | ORAL_TABLET | Freq: Two times a day (BID) | ORAL | Status: DC

## 2018-03-23 NOTE — Progress Notes (Addendum)
Received call from Attending MD -patient is not safe to return home; Physical Therapy to re-eval; consult placed for SW to re-eval for placement; B [place] RN,MHA,BSN [phone]

## 2018-03-23 NOTE — Progress Notes (Signed)
PROGRESS NOTE    KAIRON SHOCK  KZS:[id number] DOB: 1941/04/19 DOA: 03/16/2018 PCP: Dettinger, Fransisca Kaufmann, MD     Brief Narrative:  77 year old man admitted on 5/6.  He had been awaiting emergent psychiatric placement due to major depression with suicidal ideation with an active suicide plan.  When he was evaluated by ED physician on day of admission he was noted to be pale, weak with a hemoglobin of 7.3.  As of 5/7 his hemoglobin has further dropped to 5.6, FOBT is positive.  He is now status post 2 unit PRBCs with good stability noted.  GI performed EGD with findings of some gastritis with erosions and some biopsies have been obtained.   Assessment & Plan:   Principal Problem:   Atrial fibrillation with RVR (HCC) Active Problems:   Hypertension   Heme positive stool   Stage III chronic kidney disease (HCC)   Hyperlipidemia LDL goal <130   Normocytic anemia   Suicidal ideation   Major depression   Palliative care by specialist   Goals of care, counseling/discussion   DNR (do not resuscitate) discussion   Atrial fibrillation with rapid ventricular response-resolved -I suspect RVR due to a combination of blood loss/dehydration in addition to his rate controlling medications being held.  -Continue bisoprolol and Cardizem added increased dosed per cardiology. -Started Eliquis before wound evaluation today, but will hold for now in case debridement needed.  Normocytic anemia-improved s/p 2U PRBC transfusion; stable -Definitely a chronic component due to chronic kidney disease but do suspect an acute blood loss anemia as well especially given rapidity of decreased hemoglobin in addition to positive fecal occult blood. -Supratherapeutic INR has normalized after vitamin K administration CT of the abdomen and pelvis with no retroperitoneal bleed noted -Appreciate GI evaluation with plans for EGD once stable from cardiopulmonary standpoint -Appreciate palliative care consultation  Right  arm wound -Wound care evaluation with possible need for debridement -Hold Eliquis after initial dose until eval completed  Stage IV CKD -At baseline his Cr is between 2.3-2.5. -Remains at baseline. -Needs OP follow up with nephrology as he is close to needing HD. -No acute indications for HD.  Hyperlipidemia -Continue statin.  Acute Hypoxemic Respiratory Failure-improved -Chest x-ray with no acute findings, he is currently on room air.  Suspect this is secondary to severe anemia.  Depression with Suicidal Ideation -Cleared by TTS for discharge to assisted living facility -No further ideations currently noted   DVT prophylaxis: SCDs Code Status: full code Family Communication: patient only Disposition Plan: Plan for discharge possibly to SNF/Rehab based on discussion with [facility] Consultants: GI and cardiology  GI  TTS  Cardiology  Procedures:   none  Antimicrobials:  Anti-infectives (From admission, onward)   Start     Dose/Rate Route Frequency Ordered Stop   03/16/18 2200  cephALEXin (KEFLEX) capsule 500 mg     500 mg Oral  Once 03/16/18 2147 03/16/18 2158   03/16/18 0000  cephALEXin (KEFLEX) 500 MG capsule     500 mg Oral 3 times daily 03/16/18 2237         Subjective: Patient seen and evaluated this morning.  He denies any particular complaints or concerns this morning.  Objective: Vitals:   03/22/18 2032 03/23/18 0626 03/23/18 0748 03/23/18 1332  BP: (!) 135/92 128/72  138/81  Pulse: (!) 114 (!) 109  (!) 175  Resp:    18  Temp: (!) 97.2 F (36.2 C) 98.2 F (36.8 C)  98 F (36.7 C)  TempSrc: Oral Oral  Oral  SpO2: 95%  (!) 85% 90%  Weight:      Height:        Intake/Output Summary (Last 24 hours) at 03/23/2018 1535 Last data filed at 03/22/2018 1800 Gross per 24 hour  Intake 300 ml  Output -  Net 300 ml   Filed Weights   03/16/18 1941 03/18/18 1704  Weight: 73.5 kg (162 lb) 67.4 kg (148 lb 9.4 oz)    Examination:  General exam: drowsy  but can answer uncomplicated questions., pale Respiratory system: Clear to auscultation. Respiratory effort normal. Cardiovascular system:RRR. No murmurs, rubs, gallops. Gastrointestinal system: Abdomen is nondistended, soft and nontender. No organomegaly or masses felt. Normal bowel sounds heard. Central nervous system: Non focal Extremities: No C/C/E, +pedal pulses Skin: No rashes, lesions or ulcers; R arm wound with granuation tissue and skin flap with dark blood clots Psychiatry: Unable to fully assess given current mental state.    Data Reviewed: I have personally reviewed following labs and imaging studies  CBC: Recent Labs  Lab 03/16/18 2115  03/17/18 1410 03/18/18 0843 03/19/18 0605 03/20/18 0608 03/21/18 0638 03/22/18 0651 03/23/18 0452  WBC 11.8*   < > 10.8* 7.7 6.6 7.2 6.6 5.8 6.5  NEUTROABS 9.5*  --  7.7 5.6  --   --   --   --   --   HGB 10.2*   < > 8.4* 7.3* 5.6* 8.9* 8.6* 8.6* 8.8*  HCT 30.7*   < > 25.7* 22.3* 17.7* 26.7* 26.0* 25.8* 27.3*  MCV 95.3   < > 94.5 94.5 97.8 89.9 89.7 89.6 91.9  PLT 249   < > 246 196 166 151 172 171 204   < > = values in this interval not displayed.   Basic Metabolic Panel: Recent Labs  Lab 03/19/18 0605 03/20/18 0608 03/21/18 0638 03/22/18 0651 03/23/18 0452  NA 140 139 140 140 143  K 4.2 4.3 4.2 4.2 4.3  CL 111 109 110 111 115*  CO2 22 22 21* 21* 21*  GLUCOSE 99 101* 93 84 79  BUN 44* 40* 38* 34* 32*  CREATININE 3.37* 3.19* 3.16* 3.06* 2.91*  CALCIUM 7.7* 8.1* 8.2* 8.2* 8.2*   GFR: Estimated Creatinine Clearance: 19.9 mL/min (A) (by C-G formula based on SCr of 2.91 mg/dL (H)). Liver Function Tests: Recent Labs  Lab 03/16/18 2115 03/18/18 0843  AST 17 15  ALT 13* 12*  ALKPHOS 99 70  BILITOT 1.1 1.0  PROT 6.0* 4.8*  ALBUMIN 2.8* 2.3*   No results for input(s): LIPASE, AMYLASE in the last 168 hours. No results for input(s): AMMONIA in the last 168 hours. Coagulation Profile: Recent Labs  Lab 03/16/18 2115  03/17/18 1410 03/19/18 1210  INR 5.90* 2.66 1.30   Cardiac Enzymes: Recent Labs  Lab 03/18/18 0843 03/18/18 1813 03/19/18 0058 03/19/18 0605  TROPONINI 0.04* 0.03* 0.03* 0.03*   BNP (last 3 results) No results for input(s): PROBNP in the last 8760 hours. HbA1C: No results for input(s): HGBA1C in the last 72 hours. CBG: No results for input(s): GLUCAP in the last 168 hours. Lipid Profile: No results for input(s): CHOL, HDL, LDLCALC, TRIG, CHOLHDL, LDLDIRECT in the last 72 hours. Thyroid Function Tests: No results for input(s): TSH, T4TOTAL, FREET4, T3FREE, THYROIDAB in the last 72 hours. Anemia Panel: No results for input(s): VITAMINB12, FOLATE, FERRITIN, TIBC, IRON, RETICCTPCT in the last 72 hours. Urine analysis:    Component Value Date/Time   COLORURINE YELLOW 03/15/2018 2110  APPEARANCEUR HAZY (A) 03/15/2018 2110   LABSPEC 1.013 03/15/2018 2110   PHURINE 5.0 03/15/2018 2110   GLUCOSEU NEGATIVE 03/15/2018 2110   HGBUR NEGATIVE 03/15/2018 2110   BILIRUBINUR NEGATIVE 03/15/2018 2110   BILIRUBINUR neg 10/07/2014 1122   KETONESUR NEGATIVE 03/15/2018 2110   PROTEINUR 100 (A) 03/15/2018 2110   UROBILINOGEN negative 10/07/2014 1122   UROBILINOGEN 0.2 04/03/2014 2308   NITRITE NEGATIVE 03/15/2018 2110   LEUKOCYTESUR NEGATIVE 03/15/2018 2110   Sepsis Labs: @LABRCNTIP (procalcitonin:4,lacticidven:4)  )No results found for this or any previous visit (from the past 240 hour(s)).    Radiology Studies: No results found.   Scheduled Meds: . apixaban  5 mg Oral BID  . bisoprolol  10 mg Oral Daily  . calcitRIOL  0.25 mcg Oral Daily  . diltiazem  240 mg Oral Daily  . LORazepam  0.5 mg Oral QHS  . mouth rinse  15 mL Mouth Rinse BID  . pantoprazole  40 mg Oral Daily  . terazosin  2 mg Oral QHS   Continuous Infusions:    LOS: 5 days    Time spent: 35 minutes. Greater than 50% of this time was spent in direct contact with the patient, coordinating care and discussing  relevant ongoing clinical issues, including anemia, potential causes and poor overall prognosis.     Leonce Bale Darleen Crocker, DO [facility] Pager [phone]  If 7PM-7AM, please contact night-coverage [email or web address] Password [email or web address] 03/23/2018, 3:35 PM

## 2018-03-23 NOTE — Plan of Care (Signed)
Afebrile at current time. No new skin integrity issues at this time. Dressing to arms are clean,dry, and intact.

## 2018-03-23 NOTE — Progress Notes (Signed)
Went to change dressing on patient's right arm. Called charge nurse to look at wound. We expressed multiple blood clots out of wound. Called Dr. Manuella Ghazi to look at it. He ordered a wound consult. Dressed the wound.

## 2018-03-23 NOTE — Progress Notes (Signed)
  Patient without abdominal pain. No hematemesis or melena Tolerating diet  Vital signs in last 24 hours: Temp:  [97.2 F (36.2 C)-98.2 F (36.8 C)] 98.2 F (36.8 C) (05/11 0626) Pulse Rate:  [55-114] 109 (05/11 0626) Resp:  [18] 18 (05/10 1327) BP: (128-145)/(72-92) 128/72 (05/11 0626) SpO2:  [85 %-95 %] 85 % (05/11 0748) Last BM Date: 03/21/18 General:   disheveled pleasant and cooperative in NAD Abdomen:  Nondistended. Positive bowel sounds soft and nontender Extremities:  Without clubbing or edema.    Intake/Output from previous day: 05/10 0701 - 05/11 0700 In: 1065 [P.O.:240; I.V.:825] Out: -  Intake/Output this shift: No intake/output data recorded.  Lab Results: Recent Labs    03/21/18 0638 03/22/18 0651 03/23/18 0452  WBC 6.6 5.8 6.5  HGB 8.6* 8.6* 8.8*  HCT 26.0* 25.8* 27.3*  PLT 172 171 204   BMET Recent Labs    03/21/18 0638 03/22/18 0651 03/23/18 0452  NA 140 140 143  K 4.2 4.2 4.3  CL 110 111 115*  CO2 21* 21* 21*  GLUCOSE 93 84 79  BUN 38* 34* 32*  CREATININE 3.16* 3.06* 2.91*  CALCIUM 8.2* 8.2* 8.2*   LFT No results for input(s): PROT, ALBUMIN, AST, ALT, ALKPHOS, BILITOT, BILIDIR, IBILI in the last 72 hours. PT/INR No results for input(s): LABPROT, INR in the last 72 hours. Hepatitis Panel No results for input(s): HEPBSAG, HCVAB, HEPAIGM, HEPBIGM in the last 72 hours. C-Diff No results for input(s): CDIFFTOX in the last 72 hours.  Studies/Results: No results found.   Impression:  Profound anemia requiring transfusion in the setting of a marked coagulopathy. Gastric erosions found at EGD without other significant pathology. Patient not displaying any evidence of overt GI bleeding at this time.   Recommendations:  As discussed with Dr. Manuella Ghazi yesterday, I would favor resumption of anticoagulation therapy in the way of Eliquis. so long as anticoagulation can be maintained in the therapuetic window.   Continue once daily PPI therapy. I'll  follow-up on gastric biopsies. If he displays evidence of ongoing GI blood loss, then further evaluation would be needed and benefits of ongoing anti-coaugualtion would need to be re-assessed.[name]

## 2018-03-23 NOTE — Plan of Care (Signed)
progressing 

## 2018-03-23 NOTE — Progress Notes (Signed)
CSW consulted with physician concerning disposition plan for pt .  Physician to follow up with PT regarding SNF.  Reed Breech LCSWA [phone]

## 2018-03-24 DIAGNOSIS — S51801A Unspecified open wound of right forearm, initial encounter: Secondary | ICD-10-CM

## 2018-03-24 DIAGNOSIS — W19XXXA Unspecified fall, initial encounter: Secondary | ICD-10-CM

## 2018-03-24 LAB — BASIC METABOLIC PANEL
Anion gap: 7 (ref 5–15)
BUN: 33 mg/dL — AB (ref 6–20)
CO2: 21 mmol/L — ABNORMAL LOW (ref 22–32)
CREATININE: 3.03 mg/dL — AB (ref 0.61–1.24)
Calcium: 8 mg/dL — ABNORMAL LOW (ref 8.9–10.3)
Chloride: 112 mmol/L — ABNORMAL HIGH (ref 101–111)
GFR, EST AFRICAN AMERICAN: 21 mL/min — AB (ref >60)
GFR, EST NON AFRICAN AMERICAN: 18 mL/min — AB (ref >60)
Glucose, Bld: 90 mg/dL (ref 65–99)
Potassium: 4.1 mmol/L (ref 3.5–5.1)
SODIUM: 140 mmol/L (ref 135–145)

## 2018-03-24 LAB — CBC
HCT: 25.6 % — ABNORMAL LOW (ref 39.0–52.0)
Hemoglobin: 8.4 g/dL — ABNORMAL LOW (ref 13.0–17.0)
MCH: 29.9 pg (ref 26.0–34.0)
MCHC: 32.8 g/dL (ref 30.0–36.0)
MCV: 91.1 fL (ref 78.0–100.0)
PLATELETS: 207 10*3/uL (ref 150–400)
RBC: 2.81 MIL/uL — ABNORMAL LOW (ref 4.22–5.81)
RDW: 18.1 % — ABNORMAL HIGH (ref 11.5–15.5)
WBC: 6.6 10*3/uL (ref 4.0–10.5)

## 2018-03-24 MED ORDER — APIXABAN 5 MG PO TABS
5.0000 mg | ORAL_TABLET | Freq: Two times a day (BID) | ORAL | Status: DC
  Administered 2018-03-24 – 2018-03-29 (×11): 5 mg via ORAL
  Filled 2018-03-24 (×11): qty 1

## 2018-03-24 NOTE — Consult Note (Signed)
[place]  Reason for Consult: Right arm wound / avulsion  Referring Physician:  Dr. Manuella Ghazi   Chief Complaint    Fall; V70.1      Don [name] is a 77 y.o. male.  HPI: Don [name] is a 77 yo who was admitted with suicidal ideation and fell and now has a right arm avulsion with some bleeding while on Eliquis. Don [name] reports some pain in the arm and the RNs wrapped it yesterday. Some blood clots were pushed out from the flap of skin per the RNs report.  Don [name] also was found to have gastritis on EGD done while in the hospital and currently his Eliquis is on hold.   Past Medical History:  Diagnosis Date  . Acute bronchitis 04/03/2014  . Anxiety   . Atrial fibrillation ([place])   . Cataract   . COPD (chronic obstructive pulmonary disease) ([place])   . Essential hypertension   . Hyperlipidemia   . Macular degeneration   . Noncompliance with medications 04/2013   Xarelto, digoxin previously  . Pre-diabetes   . Prostate cancer ([place])   . Stage III chronic kidney disease 04/03/2014  . Tubular adenoma of colon 07/31/02, 11/18/03    Past Surgical History:  Procedure Laterality Date  . Anal abcess,Hemorroids,    . COLONOSCOPY N/A 04/05/2014   Dr. Fuller Plan: 6 mm sessile polyp from sigmoid colon (tubular adenoma), internal hemorrhoids  . COLONOSCOPY W/ BIOPSIES  11/18/2003   Dr. Earle Gell  . ESOPHAGOGASTRODUODENOSCOPY  11/18/2003   Dr. Earle Gell  . ESOPHAGOGASTRODUODENOSCOPY N/A 04/05/2014   Dr. Fuller Plan: variable Z line, negative Barrett's, small hiatal hernia  . ESOPHAGOGASTRODUODENOSCOPY N/A 02/02/2017   Dr. Oneida Alar: LA Grade B esophagitis, one moderate benign-appearing intrinsic stenosis traversed, small non-bleeding diverticulum in second portion of duodenum, reactive gastropathy, no H.pylori.  [name] RETROPUBIC PROSTATECTOMY  11/26/2001  . TONSILLECTOMY      Family History  Problem Relation Age of Onset  . Diabetes Father   . Heart disease Father        MI  . Heart  attack Father   . Heart attack Mother   . Heart disease Brother   . Cancer Brother        lung  . Lung cancer Brother   . Heart disease Brother   . Lung cancer Sister   . Cancer Sister        lung  . Heart disease Brother   . Heart disease Brother     Social History   Tobacco Use  . Smoking status: Current Some Day Smoker    Packs/day: 0.50    Years: 62.00    Pack years: 31.00    Types: Cigarettes, Cigars    Last attempt to quit: 12/28/2016    Years since quitting: 1.2  . Smokeless tobacco: Never Used  Substance Use Topics  . Alcohol use: No  . Drug use: No    Medications:  I have reviewed the patient's current medications. Prior to Admission:  Medications Prior to Admission  Medication Sig Dispense Refill Last Dose  . atorvastatin (LIPITOR) 20 MG tablet TAKE 1 TABLET DAILY 30 tablet 5 Past Month at Unknown time  . bisoprolol (ZEBETA) 10 MG tablet TAKE (1) TABLET TWICE A DAY. 180 tablet 0 Past Month at Unknown time  . calcitRIOL (ROCALTROL) 0.25 MCG capsule Take 1 capsule (0.25 mcg total) by mouth daily. 30 capsule 2 Past Month at Unknown time  . clonazePAM (KLONOPIN) 1 MG tablet TAKE 1/2  TABLET ONCE DAILY AS NEEDED FOR ANXIETY 15 tablet 1 Past Month at Unknown time  . diltiazem (DILACOR XR) 180 MG 24 hr capsule Take 180 mg by mouth 2 (two) times daily.   Past Month at Unknown time  . iron polysaccharides (NIFEREX) 150 MG capsule Take 150 mg by mouth daily.   Past Month at Unknown time  . loratadine (CLARITIN) 10 MG tablet Take 10 mg by mouth daily.   Past Month at Unknown time  . pantoprazole (PROTONIX) 40 MG tablet Take 40 mg by mouth 2 (two) times daily.   Past Month at Unknown time  . terazosin (HYTRIN) 2 MG capsule Take 2 mg by mouth at bedtime.   Past Month at Unknown time   Scheduled: . apixaban  5 mg Oral BID  . bisoprolol  10 mg Oral Daily  . calcitRIOL  0.25 mcg Oral Daily  . diltiazem  240 mg Oral Daily  . LORazepam  0.5 mg Oral QHS  . mouth rinse  15 mL  Mouth Rinse BID  . pantoprazole  40 mg Oral Daily  . terazosin  2 mg Oral QHS   Continuous:  NLZ:[id number] **OR** acetaminophen, hydrALAZINE, ondansetron **OR** ondansetron (ZOFRAN) IV, senna-docusate  Allergies: Allergies  Allergen Reactions  . Wellbutrin [Bupropion] Anxiety      ROS:  A comprehensive review of systems was negative except for: Musculoskeletal: positive for right forearm pain with movement, bleeding  Blood pressure 138/88, pulse (!) 109, temperature 98.5 F (36.9 C), temperature source Oral, resp. rate 18, height '5\' 9"'$  (1.753 m), weight 148 lb 9.4 oz (67.4 kg), SpO2 94 %. Physical Exam  Constitutional: Don [name] is oriented to person, place, and time. Don [name] appears well-developed.  HENT:  Head: Normocephalic.  Eyes: Pupils are equal, round, and reactive to light.  Neck: Normal range of motion.  Cardiovascular: Normal rate.  Pulmonary/Chest: Effort normal.  Musculoskeletal: Normal range of motion.  Except for guarding motion of the right upper extremity, medial portion of the volar surface with avulsion with exposed dermis and flap with clot underneath, some swelling, no erythema, no active bleeding  Neurological: Don [name] is alert and oriented to person, place, and time.  Skin: Skin is warm and dry.  Psychiatric: His behavior is normal.  Vitals reviewed.     Results: Results for orders placed or performed during the hospital encounter of 03/16/18 (from the past 48 hour(s))  Basic metabolic panel     Status: Abnormal   Collection Time: 03/23/18  4:52 AM  Result Value Ref Range   Sodium 143 135 - 145 mmol/L   Potassium 4.3 3.5 - 5.1 mmol/L   Chloride 115 (H) 101 - 111 mmol/L   CO2 21 (L) 22 - 32 mmol/L   Glucose, Bld 79 65 - 99 mg/dL   BUN 32 (H) 6 - 20 mg/dL   Creatinine, Ser 2.91 (H) 0.61 - 1.24 mg/dL   Calcium 8.2 (L) 8.9 - 10.3 mg/dL   GFR calc non Af Amer 19 (L) >60 mL/min   GFR calc Af Amer 22 (L) >60 mL/min    Comment: (NOTE) The eGFR has been  calculated using the CKD EPI equation. This calculation has not been validated in all clinical situations. eGFR's persistently <60 mL/min signify possible Chronic Kidney Disease.    Anion gap 7 5 - 15    Comment: Performed at [facility], [address], [place], [place] [address]  CBC     Status: Abnormal   Collection Time: 03/23/18  4:52  AM  Result Value Ref Range   WBC 6.5 4.0 - 10.5 K/uL   RBC 2.97 (L) 4.22 - 5.81 MIL/uL   Hemoglobin 8.8 (L) 13.0 - 17.0 g/dL   HCT 27.3 (L) 39.0 - 52.0 %   MCV 91.9 78.0 - 100.0 fL   MCH 29.6 26.0 - 34.0 pg   MCHC 32.2 30.0 - 36.0 g/dL   RDW 18.5 (H) 11.5 - 15.5 %   Platelets 204 150 - 400 K/uL    Comment: Performed at [facility], [address], [place], [place] [address]  CBC     Status: Abnormal   Collection Time: 03/24/18  6:22 AM  Result Value Ref Range   WBC 6.6 4.0 - 10.5 K/uL   RBC 2.81 (L) 4.22 - 5.81 MIL/uL   Hemoglobin 8.4 (L) 13.0 - 17.0 g/dL   HCT 25.6 (L) 39.0 - 52.0 %   MCV 91.1 78.0 - 100.0 fL   MCH 29.9 26.0 - 34.0 pg   MCHC 32.8 30.0 - 36.0 g/dL   RDW 18.1 (H) 11.5 - 15.5 %   Platelets 207 150 - 400 K/uL    Comment: Performed at [facility], [address] [place], [place] [address]  Basic metabolic panel     Status: Abnormal   Collection Time: 03/24/18  6:22 AM  Result Value Ref Range   Sodium 140 135 - 145 mmol/L   Potassium 4.1 3.5 - 5.1 mmol/L   Chloride 112 (H) 101 - 111 mmol/L   CO2 21 (L) 22 - 32 mmol/L   Glucose, Bld 90 65 - 99 mg/dL   BUN 33 (H) 6 - 20 mg/dL   Creatinine, Ser 3.03 (H) 0.61 - 1.24 mg/dL   Calcium 8.0 (L) 8.9 - 10.3 mg/dL   GFR calc non Af Amer 18 (L) >60 mL/min   GFR calc Af Amer 21 (L) >60 mL/min    Comment: (NOTE) The eGFR has been calculated using the CKD EPI equation. This calculation has not been validated in all clinical situations. eGFR's persistently <60 mL/min signify possible Chronic Kidney Disease.    Anion gap 7 5 - 15    Comment: Performed at [facility], [address], [place], [place] [address]   Assessment & Plan:  Don [name] is a 77 y.o. male with a superficial avulsion injury of the epidermis on the right volar surface of the arm.  Don [name] is having some pain with movement, swelling reported to be better per the RNs.  Skin flap looks viable on the inferior portion, no signs of infection at this time.  -Daily xeroform gauze, ace wrap to RUE for some compression  -Ok to restart Eliquis as needed  -Will check on tomorrow   All questions were answered to the satisfaction of the patient.  Updated Dr. Manuella Ghazi .  Virl Cagey 03/24/2018, 10:20 AM

## 2018-03-24 NOTE — Progress Notes (Signed)
Full Consult to Follow.  Right arm with superficial skin avulsion from fall with portion with clot under the skin flap. Xerofrom and ace wrap to the area for some compression. Ok to resume eliquis.   Curlene Labrum, MD [facility] [address] [place], [place] [address] [phone] (office)

## 2018-03-24 NOTE — Plan of Care (Signed)
progressing 

## 2018-03-24 NOTE — Progress Notes (Signed)
PROGRESS NOTE    Don [name]  WUJ:[id number] DOB: [birth year] DOA: 03/16/2018 PCP: Dettinger, Fransisca Kaufmann, MD     Brief Narrative:  77 year old man admitted on 5/6.  He had been awaiting emergent psychiatric placement due to major depression with suicidal ideation with an active suicide plan.  When he was evaluated by ED physician on day of admission he was noted to be pale, weak with a hemoglobin of 7.3.  As of 5/7 his hemoglobin has further dropped to 5.6, FOBT is positive.  He is now status post 2 unit PRBCs with good stability noted.  GI performed EGD with findings of some gastritis with erosions and some biopsies have been obtained.   Assessment & Plan:   Principal Problem:   Atrial fibrillation with RVR (HCC) Active Problems:   Hypertension   Heme positive stool   Stage III chronic kidney disease (HCC)   Hyperlipidemia LDL goal <130   Normocytic anemia   Suicidal ideation   Major depression   Palliative care by specialist   Goals of care, counseling/discussion   DNR (do not resuscitate) discussion   Atrial fibrillation with rapid ventricular response-resolved -I suspect RVR due to a combination of blood loss/dehydration in addition to his rate controlling medications being held.  -Continue bisoprolol and Cardizem added increased dosed per cardiology. -Restart Eliquis today  Normocytic anemia-improved s/p 2U PRBC transfusion; stable -Definitely a chronic component due to chronic kidney disease but do suspect an acute blood loss anemia as well especially given rapidity of decreased hemoglobin in addition to positive fecal occult blood. -Supratherapeutic INR has normalized after vitamin K administration CT of the abdomen and pelvis with no retroperitoneal bleed noted -Appreciate GI evaluation with plans for EGD once stable from cardiopulmonary standpoint -Appreciate palliative care consultation -Continue CBC monitoring in a.m. with restart of Eliquis today.  Right arm  wound -Appreciate evaluation by general surgery with plans to continue Xeroform dressing and Ace wrap - Okay for Eliquis restart today  Stage IV CKD -At baseline his Cr is between 2.3-2.5. -Remains at baseline approximately -Needs OP follow up with nephrology as he is close to needing HD. -No acute indications for HD. -No symptomatology noted; good appetite with no nausea or vomiting  Hyperlipidemia -Continue statin.  Acute Hypoxemic Respiratory Failure-improved -Chest x-ray with no acute findings, he is currently on room air.  Suspect this is secondary to severe anemia.  Depression with Suicidal Ideation-resolved -Cleared by TTS for discharge to assisted living facility -No further ideations currently noted   DVT prophylaxis: SCDs Code Status: full code Family Communication: Spoke with son and stepson on 5/11 Disposition Plan: Plan for discharge to SNF/rehab once bed can be obtained Consultants:   GI  TTS  Cardiology  General surgery  Procedures:   none  Antimicrobials:  Anti-infectives (From admission, onward)   Start     Dose/Rate Route Frequency Ordered Stop   03/16/18 2200  cephALEXin (KEFLEX) capsule 500 mg     500 mg Oral  Once 03/16/18 2147 03/16/18 2158   03/16/18 0000  cephALEXin (KEFLEX) 500 MG capsule     500 mg Oral 3 times daily 03/16/18 2237         Subjective: Patient seen and evaluated this morning.  He denies any particular complaints or concerns this morning.   Objective: Vitals:   03/23/18 1332 03/23/18 2056 03/24/18 0646 03/24/18 0803  BP: 138/81 (!) 147/96 138/88   Pulse: (!) 175 (!) 107 (!) 109   Resp: 18  Temp: 98 F (36.7 C) 98.2 F (36.8 C) 98.5 F (36.9 C)   TempSrc: Oral Oral Oral   SpO2: 90% 98% 97% 94%  Weight:      Height:        Intake/Output Summary (Last 24 hours) at 03/24/2018 1008 Last data filed at 03/23/2018 1808 Gross per 24 hour  Intake 240 ml  Output -  Net 240 ml   Filed Weights   03/16/18 1941  03/18/18 1704  Weight: 73.5 kg (162 lb) 67.4 kg (148 lb 9.4 oz)    Examination:  General exam: drowsy but can answer uncomplicated questions., pale Respiratory system: Clear to auscultation. Respiratory effort normal. Cardiovascular system:RRR. No murmurs, rubs, gallops. Gastrointestinal system: Abdomen is nondistended, soft and nontender. No organomegaly or masses felt. Normal bowel sounds heard. Central nervous system: Non focal Extremities: No C/C/E, +pedal pulses Skin: No rashes, lesions or ulcers; R arm wound with granuation tissue and skin flap with dark blood clots; does not appear infected Psychiatry: Unable to fully assess given current mental state.    Data Reviewed: I have personally reviewed following labs and imaging studies  CBC: Recent Labs  Lab 03/17/18 1410 03/18/18 0843  03/20/18 0608 03/21/18 7782 03/22/18 0651 03/23/18 0452 03/24/18 0622  WBC 10.8* 7.7   < > 7.2 6.6 5.8 6.5 6.6  NEUTROABS 7.7 5.6  --   --   --   --   --   --   HGB 8.4* 7.3*   < > 8.9* 8.6* 8.6* 8.8* 8.4*  HCT 25.7* 22.3*   < > 26.7* 26.0* 25.8* 27.3* 25.6*  MCV 94.5 94.5   < > 89.9 89.7 89.6 91.9 91.1  PLT 246 196   < > 151 172 171 204 207   < > = values in this interval not displayed.   Basic Metabolic Panel: Recent Labs  Lab 03/20/18 0608 03/21/18 0638 03/22/18 0651 03/23/18 0452 03/24/18 0622  NA 139 140 140 143 140  K 4.3 4.2 4.2 4.3 4.1  CL 109 110 111 115* 112*  CO2 22 21* 21* 21* 21*  GLUCOSE 101* 93 84 79 90  BUN 40* 38* 34* 32* 33*  CREATININE 3.19* 3.16* 3.06* 2.91* 3.03*  CALCIUM 8.1* 8.2* 8.2* 8.2* 8.0*   GFR: Estimated Creatinine Clearance: 19.2 mL/min (A) (by C-G formula based on SCr of 3.03 mg/dL (H)). Liver Function Tests: Recent Labs  Lab 03/18/18 0843  AST 15  ALT 12*  ALKPHOS 70  BILITOT 1.0  PROT 4.8*  ALBUMIN 2.3*   No results for input(s): LIPASE, AMYLASE in the last 168 hours. No results for input(s): AMMONIA in the last 168  hours. Coagulation Profile: Recent Labs  Lab 03/17/18 1410 03/19/18 1210  INR 2.66 1.30   Cardiac Enzymes: Recent Labs  Lab 03/18/18 0843 03/18/18 1813 03/19/18 0058 03/19/18 0605  TROPONINI 0.04* 0.03* 0.03* 0.03*   BNP (last 3 results) No results for input(s): PROBNP in the last 8760 hours. HbA1C: No results for input(s): HGBA1C in the last 72 hours. CBG: No results for input(s): GLUCAP in the last 168 hours. Lipid Profile: No results for input(s): CHOL, HDL, LDLCALC, TRIG, CHOLHDL, LDLDIRECT in the last 72 hours. Thyroid Function Tests: No results for input(s): TSH, T4TOTAL, FREET4, T3FREE, THYROIDAB in the last 72 hours. Anemia Panel: No results for input(s): VITAMINB12, FOLATE, FERRITIN, TIBC, IRON, RETICCTPCT in the last 72 hours. Urine analysis:    Component Value Date/Time   COLORURINE YELLOW 03/15/2018 2110   APPEARANCEUR  HAZY (A) 03/15/2018 2110   LABSPEC 1.013 03/15/2018 2110   PHURINE 5.0 03/15/2018 2110   GLUCOSEU NEGATIVE 03/15/2018 2110   HGBUR NEGATIVE 03/15/2018 2110   BILIRUBINUR NEGATIVE 03/15/2018 2110   BILIRUBINUR neg 10/07/2014 1122   KETONESUR NEGATIVE 03/15/2018 2110   PROTEINUR 100 (A) 03/15/2018 2110   UROBILINOGEN negative 10/07/2014 1122   UROBILINOGEN 0.2 04/03/2014 2308   NITRITE NEGATIVE 03/15/2018 2110   LEUKOCYTESUR NEGATIVE 03/15/2018 2110   Sepsis Labs: @LABRCNTIP (procalcitonin:4,lacticidven:4)  )No results found for this or any previous visit (from the past 240 hour(s)).    Radiology Studies: No results found.   Scheduled Meds: . apixaban  5 mg Oral BID  . bisoprolol  10 mg Oral Daily  . calcitRIOL  0.25 mcg Oral Daily  . diltiazem  240 mg Oral Daily  . LORazepam  0.5 mg Oral QHS  . mouth rinse  15 mL Mouth Rinse BID  . pantoprazole  40 mg Oral Daily  . terazosin  2 mg Oral QHS   Continuous Infusions:    LOS: 6 days    Time spent: 35 minutes. Greater than 50% of this time was spent in direct contact with the  patient, coordinating care and discussing relevant ongoing clinical issues, including anemia, potential causes and poor overall prognosis.     Kyley Laurel Darleen Crocker, DO [facility] Pager [phone]  If 7PM-7AM, please contact night-coverage [email or web address] Password [email or web address] 03/24/2018, 10:08 AM

## 2018-03-25 ENCOUNTER — Encounter (HOSPITAL_COMMUNITY): Payer: Self-pay | Admitting: Internal Medicine

## 2018-03-25 DIAGNOSIS — I482 Chronic atrial fibrillation: Secondary | ICD-10-CM

## 2018-03-25 DIAGNOSIS — S51801D Unspecified open wound of right forearm, subsequent encounter: Secondary | ICD-10-CM

## 2018-03-25 LAB — CBC
HCT: 26.1 % — ABNORMAL LOW (ref 39.0–52.0)
Hemoglobin: 8.6 g/dL — ABNORMAL LOW (ref 13.0–17.0)
MCH: 30.2 pg (ref 26.0–34.0)
MCHC: 33 g/dL (ref 30.0–36.0)
MCV: 91.6 fL (ref 78.0–100.0)
PLATELETS: 232 10*3/uL (ref 150–400)
RBC: 2.85 MIL/uL — AB (ref 4.22–5.81)
RDW: 18.1 % — ABNORMAL HIGH (ref 11.5–15.5)
WBC: 6.9 10*3/uL (ref 4.0–10.5)

## 2018-03-25 LAB — BASIC METABOLIC PANEL
ANION GAP: 7 (ref 5–15)
BUN: 36 mg/dL — ABNORMAL HIGH (ref 6–20)
CALCIUM: 8.2 mg/dL — AB (ref 8.9–10.3)
CO2: 21 mmol/L — ABNORMAL LOW (ref 22–32)
Chloride: 113 mmol/L — ABNORMAL HIGH (ref 101–111)
Creatinine, Ser: 3.19 mg/dL — ABNORMAL HIGH (ref 0.61–1.24)
GFR, EST AFRICAN AMERICAN: 20 mL/min — AB (ref >60)
GFR, EST NON AFRICAN AMERICAN: 17 mL/min — AB (ref >60)
Glucose, Bld: 111 mg/dL — ABNORMAL HIGH (ref 65–99)
Potassium: 4.2 mmol/L (ref 3.5–5.1)
Sodium: 141 mmol/L (ref 135–145)

## 2018-03-25 MED ORDER — ZOLPIDEM TARTRATE 5 MG PO TABS
5.0000 mg | ORAL_TABLET | Freq: Every evening | ORAL | Status: DC | PRN
  Administered 2018-03-25 – 2018-03-28 (×4): 5 mg via ORAL
  Filled 2018-03-25 (×4): qty 1

## 2018-03-25 NOTE — Progress Notes (Signed)
Physical Therapy Treatment Patient Details Name: Don [name] MRN: [id number] DOB: [birth year] Today's Date: 03/25/2018    History of Present Illness Don [name] is a 77 y.o. male with history significant for atrial fibrillation, hypertension, hyperlipidemia, stage III chronic kidney disease, history of prostate cancer who initially presented to the [facility] emergency department on 5/4 with complaints of feeling depressed and suicidal with an actual suicide plan.  He was evaluated by TTS and it was decided that he needed inpatient psychiatric care.  He has been in the emergency department the past 2-1/2 days awaiting psychiatric placement.  Unfortunately it appears that he did not receive any fluids, his home medications were not entirely restarted and he has not been eating or drinking well since presentation.  When EDP today went to assess patient, patient was noticed to have a heart rate anywhere from 1 30-1 50, looked very pale.  Labs were drawn which showed a hemoglobin of 7.3, was 10.2 upon presentation on the fourth, he did perform a rectal exam, stool was brown in color and was only faintly positive for blood.  On blood work his creatinine is 3.32 which is around his baseline, he has stage IV chronic kidney disease, otherwise labs are within normal limits    PT Comments    Patient unsteady on feet with frequent leaning on walls in room, side rails in hallway for support due to fair/poor standing balance and generalized weakness.  On room air throughout treatment with O2 saturations between 96-99%.  Patient requested to go back to bed after therapy due to fatigue.  Patient will benefit from continued physical therapy in hospital and recommended venue below to increase strength, balance, endurance for safe ADLs and gait.    Follow Up Recommendations  SNF;Supervision/Assistance - 24 hour     Equipment Recommendations  Cane    Recommendations for Other Services       Precautions /  Restrictions Precautions Precautions: Fall Restrictions Weight Bearing Restrictions: No    Mobility  Bed Mobility Overal bed mobility: Modified Independent                Transfers Overall transfer level: Needs assistance   Transfers: Sit to/from Stand;Stand Pivot Transfers Sit to Stand: Supervision Stand pivot transfers: Supervision       General transfer comment: has to lean on nearby objects for support and unsteady  Ambulation/Gait Ambulation/Gait assistance: Min guard Ambulation Distance (Feet): 55 Feet Assistive device: None;1 person hand held assist Gait Pattern/deviations: Decreased step length - right;Decreased step length - left;Decreased stride length Gait velocity: slow   General Gait Details: unsteady slow cadence with frequent leaning on siderails/walls for support due to generalized weakness, limited mostly due to fatigue, on room air with O2 saturations between 96-99%   Stairs             Wheelchair Mobility    Modified Rankin (Stroke Patients Only)       Balance Overall balance assessment: Needs assistance Sitting-balance support: Feet supported;No upper extremity supported Sitting balance-Leahy Scale: Good     Standing balance support: No upper extremity supported;During functional activity Standing balance-Leahy Scale: Poor Standing balance comment: fair/poor                            Cognition Arousal/Alertness: Awake/alert   Overall Cognitive Status: Within Functional Limits for tasks assessed  Exercises General Exercises - Lower Extremity Long Arc Quad: Seated;AROM;Strengthening;5 reps Hip Flexion/Marching: Seated;AROM;Strengthening;5 reps Heel Raises: Seated;Strengthening;AROM;10 reps    General Comments        Pertinent Vitals/Pain Pain Assessment: No/denies pain    Home Living                      Prior Function            PT  Goals (current goals can now be found in the care plan section) Acute Rehab PT Goals Patient Stated Goal: return home PT Goal Formulation: With patient Time For Goal Achievement: 04/01/18 Potential to Achieve Goals: Good Progress towards PT goals: Progressing toward goals    Frequency    Min 3X/week      PT Plan Current plan remains appropriate    Co-evaluation              AM-PAC PT "6 Clicks" Daily Activity  Outcome Measure  Difficulty turning over in bed (including adjusting bedclothes, sheets and blankets)?: None Difficulty moving from lying on back to sitting on the side of the bed? : None Difficulty sitting down on and standing up from a chair with arms (e.g., wheelchair, bedside commode, etc,.)?: None Help needed moving to and from a bed to chair (including a wheelchair)?: A Little Help needed walking in hospital room?: A Little Help needed climbing 3-5 steps with a railing? : A Little 6 Click Score: 21    End of Session Equipment Utilized During Treatment: Gait belt Activity Tolerance: Patient tolerated treatment well Patient left: in bed;with call bell/phone within reach Nurse Communication: Mobility status PT Visit Diagnosis: Unsteadiness on feet (R26.81);Other abnormalities of gait and mobility (R26.89);Muscle weakness (generalized) (M62.81)     Time: 4270-6237 PT Time Calculation (min) (ACUTE ONLY): 28 min  Charges:  $Gait Training: 8-22 mins $Therapeutic Exercise: 8-22 mins                    G Codes:       9:35 AM, Mar 28, 2018 Lonell Grandchild, MPT Physical Therapist with [facility] [phone] office [phone] mobile phone

## 2018-03-25 NOTE — NC FL2 (Signed)
[place] LEVEL OF CARE SCREENING TOOL     IDENTIFICATION  Patient Name: Don [name] Birthdate: Nov 20, 1940 Sex: male Admission Date (Current Location): 03/16/2018  [facility] and [place] Number:  [employer] and Address:  [place] [address], [place]      Provider Number: [id number]  Attending Physician Name and Address:  Rodena Goldmann, DO  Relative Name and Phone Number:       Current Level of Care: Hospital Recommended Level of Care: [place] Prior Approval Number:    Date Approved/Denied:   PASRR Number:    Discharge Plan: SNF(SNF)    Current Diagnoses: Patient Active Problem List   Diagnosis Date Noted  . Avulsion of skin of right forearm   . Fall   . Palliative care by specialist   . Goals of care, counseling/discussion   . DNR (do not resuscitate) discussion   . Atrial fibrillation with RVR ([place]) 03/18/2018  . Suicidal ideation 03/18/2018  . 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 Mass of lower lobe of left lung 02/12/2018  . Depression, recurrent ([place]) 01/02/2018  . Anemia   . Normocytic anemia   . Coagulopathy ([place]) 01/31/2017  . Pancytopenia ([place]) 01/31/2017  . Respiratory distress   . Pre-diabetes 07/23/2015  . Metabolic syndrome 79/39/0300  . Urinary bladder incontinence 03/04/2015  . Paroxysmal atrial fibrillation ([place]) 05/25/2014  . Hyperlipidemia LDL goal <130 04/04/2014  . Personal history of colonic polyps 04/04/2014  . Heme positive stool 04/03/2014  . History of prostate cancer 04/03/2014  . Stage III chronic kidney disease ([place]) 04/03/2014  . Tobacco abuse 04/25/2013  . Prostate cancer ([place]) 02/08/2011  . COPD (chronic obstructive pulmonary disease) ([place]) 02/08/2011  . Hypertension 02/08/2011    Orientation RESPIRATION BLADDER Height & Weight     Self, Time, Situation, Place  Normal Continent Weight: 148 lb 9.4 oz (67.4 kg) Height:  5\' 9"  (175.3 cm)  BEHAVIORAL  SYMPTOMS/MOOD NEUROLOGICAL BOWEL NUTRITION STATUS      Continent (see discharge summary)  AMBULATORY STATUS COMMUNICATION OF NEEDS Skin   Independent Verbally Other (Comment)(laceration arm right, lower)                       Personal Care Assistance Level of Assistance  Bathing, Feeding, Dressing Bathing Assistance: Limited assistance Feeding assistance: Independent Dressing Assistance: Limited assistance     Functional Limitations Info  Sight, Hearing, Speech Sight Info: Adequate Hearing Info: Adequate Speech Info: Adequate    SPECIAL CARE FACTORS FREQUENCY  PT (By licensed PT)     PT Frequency: 5x/week              Contractures Contractures Info: Not present    Additional Factors Info  Code Status, Allergies, Psychotropic Code Status Info: Full Code Allergies Info: Wellbutrin Psychotropic Info: Klonopin         Current Medications (03/25/2018):  This is the current hospital active medication list Current Facility-Administered Medications  Medication Dose Route Frequency Provider Last Rate Last Dose  . acetaminophen (TYLENOL) tablet 650 mg  650 mg Oral Q6H PRN Daneil Dolin, MD   650 mg at 03/24/18 2200   Or  . acetaminophen (TYLENOL) suppository 650 mg  650 mg Rectal Q6H PRN Daneil Dolin, MD      . apixaban [facility]) tablet 5 mg  5 mg Oral BID Manuella Ghazi, Pratik D, DO   5 mg at 03/25/18 9233  . bisoprolol (ZEBETA) tablet  10 mg  10 mg Oral Daily Daneil Dolin, MD   10 mg at 03/25/18 0915  . calcitRIOL (ROCALTROL) capsule 0.25 mcg  0.25 mcg Oral Daily Daneil Dolin, MD   0.25 mcg at 03/25/18 0914  . diltiazem (CARDIZEM CD) 24 hr capsule 240 mg  240 mg Oral Daily Daneil Dolin, MD   240 mg at 03/25/18 0915  . hydrALAZINE (APRESOLINE) injection 5 mg  5 mg Intravenous Q6H PRN Erlene Quan, PA-C      . LORazepam (ATIVAN) tablet 0.5 mg  0.5 mg Oral QHS Daneil Dolin, MD   0.5 mg at 03/24/18 2200  . MEDLINE mouth rinse  15 mL Mouth Rinse BID Daneil Dolin, MD   15 mL at 03/25/18 0915  . ondansetron (ZOFRAN) tablet 4 mg  4 mg Oral Q6H PRN Daneil Dolin, MD       Or  . ondansetron [facility]) injection 4 mg  4 mg Intravenous Q6H PRN Daneil Dolin, MD      . pantoprazole (PROTONIX) EC tablet 40 mg  40 mg Oral Daily Daneil Dolin, MD   40 mg at 03/25/18 0915  . senna-docusate (Senokot-S) tablet 1 tablet  1 tablet Oral QHS PRN Daneil Dolin, MD      . terazosin (HYTRIN) capsule 2 mg  2 mg Oral QHS Daneil Dolin, MD   2 mg at 03/24/18 2200     Discharge Medications: Please see discharge summary for a list of discharge medications.  Relevant Imaging Results:  Relevant Lab Results:   Additional Information SSN [id number].   Horton Ellithorpe, Clydene Pugh, LCSW

## 2018-03-25 NOTE — Progress Notes (Signed)
    Subjective: No abdominal pain. No BM today. No overt GI bleeding. Did not eat much at breakfast as food was "cold". Wanting a snack.   Objective: Vital signs in last 24 hours: Temp:  [97.9 F (36.6 C)-98.2 F (36.8 C)] 98 F (36.7 C) (05/13 0555) Pulse Rate:  [90-96] 96 (05/13 0555) Resp:  [18-20] 20 (05/13 0555) BP: (123-146)/(63-69) 123/63 (05/13 0555) SpO2:  [92 %-99 %] 92 % (05/13 0555) Last BM Date: 03/23/18 General:   Alert and oriented, pleasant Abdomen:  Bowel sounds present, soft, non-tender, non-distended. No HSM or hernias noted. No rebound or guarding. No masses appreciated  Psych:  Alert and cooperative. Flat affect   Intake/Output from previous day: 05/12 0701 - 05/13 0700 In: 960 [P.O.:960] Out: 300 [Urine:300] Intake/Output this shift: No intake/output data recorded.  Lab Results: Recent Labs    03/23/18 0452 03/24/18 0622 03/25/18 0512  WBC 6.5 6.6 6.9  HGB 8.8* 8.4* 8.6*  HCT 27.3* 25.6* 26.1*  PLT 204 207 232   BMET Recent Labs    03/23/18 0452 03/24/18 0622 03/25/18 0512  NA 143 140 141  K 4.3 4.1 4.2  CL 115* 112* 113*  CO2 21* 21* 21*  GLUCOSE 79 90 111*  BUN 32* 33* 36*  CREATININE 2.91* 3.03* 3.19*  CALCIUM 8.2* 8.0* 8.2*   Lab Results  Component Value Date   ALT 12 (L) 03/18/2018   AST 15 03/18/2018   ALKPHOS 70 03/18/2018   BILITOT 1.0 03/18/2018    Assessment: 77 year old male with profound anemia requiring transfusion (2 units PRBCs this admission) in setting of coagulopathy. EGD while inpatient with normal esophagus, erosive gastropathy, normal duodenum. Pathology pending. Last colonoscopy in 2015 with simple adenoma. No overt GI bleeding, and Hgb is remaining stable. Eliquis on board as of yesterday.   Plan: Follow-up on pending surgical pathology as comes available  Will arrange follow-up with [facility] (previously seen by Dr. Carlean Purl)  Signing off.  Annitta Needs, PhD, ANP-BC [facility]     LOS: 7  days    03/25/2018, 10:15 AM

## 2018-03-25 NOTE — Clinical Social Work Note (Signed)
Patient's recommendation has been changed from Outpatient PT to SNF by PT.  LCSW spoke with step-son, [name]/POA, about recommendation and the need to get authorization by patient's insurance. [name] stated that he would speak with patient's son, Joneen Caraway. LCSW indicated that she would contact [facility].  LCSW left a voicemail message for russell requesting return contact to discuss discharge plan.     Shiniqua Groseclose, Clydene Pugh, LCSW

## 2018-03-25 NOTE — Care Management Important Message (Signed)
Important Message  Patient Details  Name: Don [name] MRN: [id number] Date of Birth: December 17, 1940   Medicare Important Message Given:  Yes    [name] 03/25/2018, 1:37 PM

## 2018-03-25 NOTE — Progress Notes (Signed)
Progress Note  Patient Name: Don [name] Date of Encounter: 03/25/2018  Primary Cardiologist: Dr. Minus Breeding  Subjective   Resting this morning.  No chest pain or recent palpitations.  Inpatient Medications    Scheduled Meds: . apixaban  5 mg Oral BID  . bisoprolol  10 mg Oral Daily  . calcitRIOL  0.25 mcg Oral Daily  . diltiazem  240 mg Oral Daily  . LORazepam  0.5 mg Oral QHS  . mouth rinse  15 mL Mouth Rinse BID  . pantoprazole  40 mg Oral Daily  . terazosin  2 mg Oral QHS    PRN Meds: acetaminophen **OR** acetaminophen, hydrALAZINE, ondansetron **OR** ondansetron (ZOFRAN) IV, senna-docusate   Vital Signs    Vitals:   03/24/18 0803 03/24/18 1301 03/24/18 2051 03/25/18 0555  BP:  (!) 146/69 124/66 123/63  Pulse:  90 95 96  Resp:  18 18 20   Temp:  97.9 F (36.6 C) 98.2 F (36.8 C) 98 F (36.7 C)  TempSrc:  Oral Oral Oral  SpO2: 94% 99% 95% 92%  Weight:      Height:        Intake/Output Summary (Last 24 hours) at 03/25/2018 0837 Last data filed at 03/25/2018 0316 Gross per 24 hour  Intake 960 ml  Output 300 ml  Net 660 ml   Filed Weights   03/16/18 1941 03/18/18 1704  Weight: 162 lb (73.5 kg) 148 lb 9.4 oz (67.4 kg)    Physical Exam   GEN:  Elderly male.  No acute distress.   Neck: No JVD. Cardiac:  Irregularly irregular, no gallop.  Respiratory: Nonlabored. Clear to auscultation bilaterally. GI: Soft, nontender, bowel sounds present. MS:  Right arm dressed and wrapped.  Labs    Chemistry Recent Labs  Lab 03/18/18 [phone]  03/23/18 0452 03/24/18 0622 03/25/18 0512  NA 140   < > 143 140 141  K 4.1   < > 4.3 4.1 4.2  CL 109   < > 115* 112* 113*  CO2 22   < > 21* 21* 21*  GLUCOSE 92   < > 79 90 111*  BUN 43*   < > 32* 33* 36*  CREATININE 3.32*   < > 2.91* 3.03* 3.19*  CALCIUM 8.1*   < > 8.2* 8.0* 8.2*  PROT 4.8*  --   --   --   --   ALBUMIN 2.3*  --   --   --   --   AST 15  --   --   --   --   ALT 12*  --   --   --   --   ALKPHOS  70  --   --   --   --   BILITOT 1.0  --   --   --   --   GFRNONAA 16*   < > 19* 18* 17*  GFRAA 19*   < > 22* 21* 20*  ANIONGAP 9   < > 7 7 7    < > = values in this interval not displayed.     Hematology Recent Labs  Lab 03/23/18 0452 03/24/18 0622 03/25/18 0512  WBC 6.5 6.6 6.9  RBC 2.97* 2.81* 2.85*  HGB 8.8* 8.4* 8.6*  HCT 27.3* 25.6* 26.1*  MCV 91.9 91.1 91.6  MCH 29.6 29.9 30.2  MCHC 32.2 32.8 33.0  RDW 18.5* 18.1* 18.1*  PLT 204 207 232    Cardiac Enzymes Recent Labs  Lab 03/18/18 0843  03/18/18 1813 03/19/18 0058 03/19/18 0605  TROPONINI 0.04* 0.03* 0.03* 0.03*   No results for input(s): TROPIPOC in the last 168 hours.    Radiology    No results found.  Cardiac Studies   Echocardiogram 01/05/2017: Study Conclusions  - Left ventricle: The cavity size was normal. Systolic function was   normal. The estimated ejection fraction was in the range of 60%   to 65%. Wall motion was normal; there were no regional wall   motion abnormalities. The study was not technically sufficient to   allow evaluation of LV diastolic dysfunction due to atrial   fibrillation. Mild concentric and moderate focal basal septal   hypertrophy. - Aortic valve: Trileaflet; mildly calcified leaflets. There was no   stenosis. - Mitral valve: Moderately calcified annulus. - Left atrium: The atrium was mildly dilated.  Patient Profile     77 y.o. male with a history of chronic atrial fibrillation and cardiomyopathy with normalization of LVEF by last echocardiogram.  He is currently admitted with depression and suicidal ideation, also acute blood loss anemia requiring PRBC transfusion and subsequent GI work-up.  Assessment & Plan    1.  Chronic atrial fibrillation.  Heart rate control is adequate on current regimen including bisoprolol and Cardizem CD.  Eliquis has been resumed as of yesterday with stable hemoglobin.  2.  Acute on chronic anemia, associated blood loss with recent drop  in hemoglobin.  EGD demonstrated gastric erosions but not overt bleeding.  Hemoglobin stable after PRBC transfusions, currently 8.6.  3.  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  4.  Depression with suicidal ideation.  Evaluated by TTS.  5.  Mixed hyperlipidemia, on statin therapy.  6.  Right arm wound, avulsion skin injury.  Has been seen by surgery.  Cleared to resume Eliquis.  Relatively stable from a cardiac perspective.  Focus on heart rate control of chronic atrial fibrillation which looks to be adequate on present doses of bisoprolol and Cardizem CD.  Eliquis has been resumed at 5 mg twice daily as of yesterday.  No additional cardiac testing is planned as an inpatient.  He should have a follow-up with cardiology (Dr. Percival Spanish) with CBC and BMET  2 weeks after discharge.  We will sign off for now.  Signed, Rozann Lesches, MD  03/25/2018, 8:38 AM

## 2018-03-25 NOTE — Progress Notes (Addendum)
[facility] Progress Note  3 Days Post-Op  Subjective: No real complaints but having some pain. Minimal drainage from the ace.   Objective: Vital signs in last 24 hours: Temp:  [98 F (36.7 C)-98.2 F (36.8 C)] 98 F (36.7 C) (05/13 0555) Pulse Rate:  [95-96] 96 (05/13 0555) Resp:  [18-20] 20 (05/13 0555) BP: (123-124)/(63-66) 123/63 (05/13 0555) SpO2:  [92 %-95 %] 92 % (05/13 0555) Last BM Date: 03/23/18  Intake/Output from previous day: 05/12 0701 - 05/13 0700 In: 960 [P.O.:960] Out: 300 [Urine:300] Intake/Output this shift: No intake/output data recorded.  No acute distress Normal work breathing Right arm wound with avulsion, inferior flap with clots underlying skin but soft and viable, no erythema or drainage/ no signs of infection  Lab Results:  Recent Labs    03/24/18 0622 03/25/18 0512  WBC 6.6 6.9  HGB 8.4* 8.6*  HCT 25.6* 26.1*  PLT 207 232   BMET Recent Labs    03/24/18 0622 03/25/18 0512  NA 140 141  K 4.1 4.2  CL 112* 113*  CO2 21* 21*  GLUCOSE 90 111*  BUN 33* 36*  CREATININE 3.03* 3.19*  CALCIUM 8.0* 8.2*    Assessment/Plan: Mr. Port is a 77 yo with multiple issues and recent fall causing a right forearm avulsion injury. Skin and flap viable. No surgical debridement indicated. Keep area clean and dry; wrap daily with xeroform and ace.   -Awaiting placement -If here Wednesday will reassess -Can follow up with PCP     LOS: 7 days    Virl Cagey 03/25/2018

## 2018-03-25 NOTE — Progress Notes (Signed)
Day shift nurse reported that Right Arm dressing was changed. Upon changing, day nurse noted moderately blood clots that she was able to express with manually pressure. Arm was cleansed and dressed with xeroform and ace bandage per MD orders. MD aware of skin integrity of the right arm and has consulted WOC. Continue to monitor. Ice applied to the affected area for comfort.

## 2018-03-25 NOTE — Consult Note (Signed)
[place] Nurse wound consult note Reason for Consult:Asked to consult on patient s/p fall resulting in skin tear to right upper arm.  Wound type:Trauma Pressure Injury POA: Na Wound QHQ:IXMDEK for guarding motion of the right upper extremity, medial portion of the volar surface with avulsion with exposed dermis and flap with clot underneath, some swelling, no erythema, no active bleeding  Drainage (amount, consistency, odor) minimal serosanguinous  No odor Periwound: ecchymosis Dressing procedure/placement/frequency: Per surgery orders:  Cleanse skin tear to right upper arm with NS and pat gently dry.  Apply xeroform gauze to wound bed and cover with kerlix. Wrap lightly with ace wrap to RUE for light compression . Change daily.  Will not follow at this time.   Will defer to surgical services and oversight.  Please re-consult if needed.  Domenic Moras RN BSN [place] Pager [phone]

## 2018-03-25 NOTE — Progress Notes (Signed)
PROGRESS NOTE    Don [name]  DTO:[id number] DOB: 15-Jul-1941 DOA: 03/16/2018 PCP: Dettinger, Fransisca Kaufmann, MD     Brief Narrative:  77 year old man admitted on 5/6.  He had been awaiting emergent psychiatric placement due to major depression with suicidal ideation with an active suicide plan.  When he was evaluated by ED physician on day of admission he was noted to be pale, weak with a hemoglobin of 7.3.  As of 5/7 his hemoglobin has further dropped to 5.6, FOBT is positive.  He is now status post 2 unit PRBCs with good stability noted.  GI performed EGD with findings of some gastritis with erosions and some biopsies have been obtained.   Assessment & Plan:   Principal Problem:   Atrial fibrillation with RVR (HCC) Active Problems:   Hypertension   Heme positive stool   Stage III chronic kidney disease (HCC)   Hyperlipidemia LDL goal <130   Normocytic anemia   Suicidal ideation   Major depression   Palliative care by specialist   Goals of care, counseling/discussion   DNR (do not resuscitate) discussion   Avulsion of skin of right forearm   Fall   Atrial fibrillation with rapid ventricular response-resolved -I suspect RVR due to a combination of blood loss/dehydration in addition to his rate controlling medications being held.  -Continue bisoprolol and Cardizem added increased dosed per cardiology. Good control noted. F/U outpatient in 2 weeks per Cardiology recommendations. -Continue Eliquis  Normocytic anemia-improved s/p 2U PRBC transfusion; stable -Definitely a chronic component due to chronic kidney disease but do suspect an acute blood loss anemia as well especially given rapidity of decreased hemoglobin in addition to positive fecal occult blood. -Supratherapeutic INR has normalized after vitamin K administration CT of the abdomen and pelvis with no retroperitoneal bleed noted -Appreciate GI evaluation with plans for EGD once stable from cardiopulmonary  standpoint -Appreciate palliative care consultation -Continue CBC monitoring; H/H has been stable  Right arm wound -Appreciate evaluation by general surgery with plans to continue Xeroform dressing and Ace wrap  Stage IV CKD -At baseline his Cr is between 2.3-2.5. -Remains at baseline approximately -Needs OP follow up with nephrology as he is close to needing HD. -No acute indications for HD. -No symptomatology noted; good appetite with no nausea or vomiting  Hyperlipidemia -Continue statin.  Acute Hypoxemic Respiratory Failure-improved -Chest x-ray with no acute findings, he is currently on room air.    Depression with Suicidal Ideation-resolved -Cleared by TTS for discharge to assisted living facility -No further ideations currently noted   DVT prophylaxis: SCDs Code Status: full code Family Communication: Spoke with son and stepson on 5/11 Disposition Plan: Plan for discharge to SNF/rehab once bed can be obtained Consultants:   GI  TTS  Cardiology  General surgery  Procedures:   none  Antimicrobials:  Anti-infectives (From admission, onward)   Start     Dose/Rate Route Frequency Ordered Stop   03/16/18 2200  cephALEXin (KEFLEX) capsule 500 mg     500 mg Oral  Once 03/16/18 2147 03/16/18 2158   03/16/18 0000  cephALEXin (KEFLEX) 500 MG capsule     500 mg Oral 3 times daily 03/16/18 2237        Subjective: Patient seen and evaluated this morning.  He denies any particular complaints or concerns this morning.   Objective: Vitals:   03/24/18 0803 03/24/18 1301 03/24/18 2051 03/25/18 0555  BP:  (!) 146/69 124/66 123/63  Pulse:  90 95 96  Resp:  18 18 20   Temp:  97.9 F (36.6 C) 98.2 F (36.8 C) 98 F (36.7 C)  TempSrc:  Oral Oral Oral  SpO2: 94% 99% 95% 92%  Weight:      Height:        Intake/Output Summary (Last 24 hours) at 03/25/2018 1420 Last data filed at 03/25/2018 0316 Gross per 24 hour  Intake 480 ml  Output 300 ml  Net 180 ml   Filed  Weights   03/16/18 1941 03/18/18 1704  Weight: 73.5 kg (162 lb) 67.4 kg (148 lb 9.4 oz)    Examination:  General exam: drowsy but can answer uncomplicated questions., pale Respiratory system: Clear to auscultation. Respiratory effort normal. Cardiovascular system:RRR. No murmurs, rubs, gallops. Gastrointestinal system: Abdomen is nondistended, soft and nontender. No organomegaly or masses felt. Normal bowel sounds heard. Central nervous system: Non focal Extremities: No C/C/E, +pedal pulses Skin: No rashes, lesions or ulcers; R arm wound wrapped in ace bandage Psychiatry: Unable to fully assess given current mental state.    Data Reviewed: I have personally reviewed following labs and imaging studies  CBC: Recent Labs  Lab 03/21/18 0638 03/22/18 0651 03/23/18 0452 03/24/18 0622 03/25/18 0512  WBC 6.6 5.8 6.5 6.6 6.9  HGB 8.6* 8.6* 8.8* 8.4* 8.6*  HCT 26.0* 25.8* 27.3* 25.6* 26.1*  MCV 89.7 89.6 91.9 91.1 91.6  PLT 172 171 204 207 440   Basic Metabolic Panel: Recent Labs  Lab 03/21/18 0638 03/22/18 0651 03/23/18 0452 03/24/18 0622 03/25/18 0512  NA 140 140 143 140 141  K 4.2 4.2 4.3 4.1 4.2  CL 110 111 115* 112* 113*  CO2 21* 21* 21* 21* 21*  GLUCOSE 93 84 79 90 111*  BUN 38* 34* 32* 33* 36*  CREATININE 3.16* 3.06* 2.91* 3.03* 3.19*  CALCIUM 8.2* 8.2* 8.2* 8.0* 8.2*   GFR: Estimated Creatinine Clearance: 18.2 mL/min (A) (by C-G formula based on SCr of 3.19 mg/dL (H)). Liver Function Tests: No results for input(s): AST, ALT, ALKPHOS, BILITOT, PROT, ALBUMIN in the last 168 hours. No results for input(s): LIPASE, AMYLASE in the last 168 hours. No results for input(s): AMMONIA in the last 168 hours. Coagulation Profile: Recent Labs  Lab 03/19/18 1210  INR 1.30   Cardiac Enzymes: Recent Labs  Lab 03/18/18 1813 03/19/18 0058 03/19/18 0605  TROPONINI 0.03* 0.03* 0.03*   BNP (last 3 results) No results for input(s): PROBNP in the last 8760  hours. HbA1C: No results for input(s): HGBA1C in the last 72 hours. CBG: No results for input(s): GLUCAP in the last 168 hours. Lipid Profile: No results for input(s): CHOL, HDL, LDLCALC, TRIG, CHOLHDL, LDLDIRECT in the last 72 hours. Thyroid Function Tests: No results for input(s): TSH, T4TOTAL, FREET4, T3FREE, THYROIDAB in the last 72 hours. Anemia Panel: No results for input(s): VITAMINB12, FOLATE, FERRITIN, TIBC, IRON, RETICCTPCT in the last 72 hours. Urine analysis:    Component Value Date/Time   COLORURINE YELLOW 03/15/2018 2110   APPEARANCEUR HAZY (A) 03/15/2018 2110   LABSPEC 1.013 03/15/2018 2110   PHURINE 5.0 03/15/2018 2110   GLUCOSEU NEGATIVE 03/15/2018 2110   HGBUR NEGATIVE 03/15/2018 2110   BILIRUBINUR NEGATIVE 03/15/2018 2110   BILIRUBINUR neg 10/07/2014 1122   KETONESUR NEGATIVE 03/15/2018 2110   PROTEINUR 100 (A) 03/15/2018 2110   UROBILINOGEN negative 10/07/2014 1122   UROBILINOGEN 0.2 04/03/2014 2308   NITRITE NEGATIVE 03/15/2018 2110   LEUKOCYTESUR NEGATIVE 03/15/2018 2110   Sepsis Labs: @LABRCNTIP (procalcitonin:4,lacticidven:4)  )No results found for this or any  previous visit (from the past 240 hour(s)).    Radiology Studies: No results found.   Scheduled Meds: . apixaban  5 mg Oral BID  . bisoprolol  10 mg Oral Daily  . calcitRIOL  0.25 mcg Oral Daily  . diltiazem  240 mg Oral Daily  . LORazepam  0.5 mg Oral QHS  . mouth rinse  15 mL Mouth Rinse BID  . pantoprazole  40 mg Oral Daily  . terazosin  2 mg Oral QHS   Continuous Infusions:    LOS: 7 days    Time spent: 35 minutes. Greater than 50% of this time was spent in direct contact with the patient, coordinating care and discussing relevant ongoing clinical issues, including anemia, potential causes and poor overall prognosis.     Norlene Lanes Darleen Crocker, DO [facility] Pager [phone]  If 7PM-7AM, please contact night-coverage [email or web address] Password [email or web address] 03/25/2018, 2:20 PM

## 2018-03-26 ENCOUNTER — Telehealth: Payer: Self-pay | Admitting: Gastroenterology

## 2018-03-26 ENCOUNTER — Encounter: Payer: Self-pay | Admitting: Internal Medicine

## 2018-03-26 LAB — GLUCOSE, CAPILLARY
GLUCOSE-CAPILLARY: 179 mg/dL — AB (ref 65–99)
GLUCOSE-CAPILLARY: 140 mg/dL — AB (ref 65–99)
Glucose-Capillary: 83 mg/dL (ref 65–99)
Glucose-Capillary: 136 mg/dL — ABNORMAL HIGH (ref 65–99)

## 2018-03-26 NOTE — Telephone Encounter (Signed)
Patient evaluated while hospitalized. We have signed off. Previously seen by Dr. Carlean Purl. Please make sure he has a follow-up with Dr. Carlean Purl after discharge, thanks!

## 2018-03-26 NOTE — Progress Notes (Signed)
PROGRESS NOTE    Don [name]  UXN:[id number] DOB: 02-Jan-1941 DOA: 03/16/2018 PCP: Dettinger, Fransisca Kaufmann, MD     Brief Narrative:  77 year old man admitted on 5/6.  He had been awaiting emergent psychiatric placement due to major depression with suicidal ideation with an active suicide plan.  When he was evaluated by ED physician on day of admission he was noted to be pale, weak with a hemoglobin of 7.3.  As of 5/7 his hemoglobin has further dropped to 5.6, FOBT is positive.  He is now status post 2 unit PRBCs with good stability noted.  GI performed EGD with findings of some gastritis with erosions and some biopsies have been obtained.   Assessment & Plan:   Principal Problem:   Atrial fibrillation with RVR (HCC) Active Problems:   Hypertension   Heme positive stool   Stage III chronic kidney disease (HCC)   Hyperlipidemia LDL goal <130   Normocytic anemia   Suicidal ideation   Major depression   Palliative care by specialist   Goals of care, counseling/discussion   DNR (do not resuscitate) discussion   Avulsion of skin of right forearm   Fall   Atrial fibrillation with rapid ventricular response-resolved -I suspect RVR due to a combination of blood loss/dehydration in addition to his rate controlling medications being held.  -Continue bisoprolol and Cardizem added increased dosed per cardiology. Good control noted. F/U outpatient in 2 weeks per Cardiology recommendations. -Continue Eliquis  Normocytic anemia-improved s/p 2U PRBC transfusion; stable -Definitely a chronic component due to chronic kidney disease but do suspect an acute blood loss anemia as well especially given rapidity of decreased hemoglobin in addition to positive fecal occult blood. -Supratherapeutic INR has normalized after vitamin K administration CT of the abdomen and pelvis with no retroperitoneal bleed noted -Appreciate GI evaluation with plans for EGD once stable from cardiopulmonary  standpoint -Appreciate palliative care consultation -Continue CBC monitoring; H/H has been stable  Right arm wound -Appreciate evaluation by general surgery with plans to continue Xeroform dressing and Ace wrap  Stage IV CKD -At baseline his Cr is between 2.3-2.5. -Remains at baseline approximately -Needs OP follow up with nephrology as he is close to needing HD. -No acute indications for HD. -No symptomatology noted; good appetite with no nausea or vomiting  Hyperlipidemia -Continue statin.  Acute Hypoxemic Respiratory Failure-improved -Chest x-ray with no acute findings, he is currently on room air.    Depression with Suicidal Ideation-resolved -Cleared by TTS for discharge to assisted living facility -No further ideations currently noted   DVT prophylaxis: SCDs Code Status: full code Family Communication: Spoke with son and stepson on 5/11 Disposition Plan: Plan for discharge to SNF/rehab once bed can be obtained Consultants:   GI  TTS  Cardiology  General surgery  Procedures:   none  Antimicrobials:  Anti-infectives (From admission, onward)   Start     Dose/Rate Route Frequency Ordered Stop   03/16/18 2200  cephALEXin (KEFLEX) capsule 500 mg     500 mg Oral  Once 03/16/18 2147 03/16/18 2158   03/16/18 0000  cephALEXin (KEFLEX) 500 MG capsule     500 mg Oral 3 times daily 03/16/18 2237        Subjective: Patient seen and evaluated this morning.  He denies any particular complaints or concerns this morning. He has been resting this morning.  Objective: Vitals:   03/25/18 0555 03/25/18 2007 03/26/18 0414 03/26/18 1313  BP: 123/63 (!) 158/98 124/75 (!) 150/77  Pulse:  96 83 93 (!) 102  Resp: 20 17  20   Temp: 98 F (36.7 C) 98 F (36.7 C) 98.3 F (36.8 C) 98.2 F (36.8 C)  TempSrc: Oral Oral Oral Oral  SpO2: 92% 99% 94% 98%  Weight:      Height:        Intake/Output Summary (Last 24 hours) at 03/26/2018 1621 Last data filed at 03/26/2018  1200 Gross per 24 hour  Intake 480 ml  Output 450 ml  Net 30 ml   Filed Weights   03/16/18 1941 03/18/18 1704  Weight: 73.5 kg (162 lb) 67.4 kg (148 lb 9.4 oz)    Examination:  General exam: drowsy but can answer uncomplicated questions., pale Respiratory system: Clear to auscultation. Respiratory effort normal. Cardiovascular system:RRR. No murmurs, rubs, gallops. Gastrointestinal system: Abdomen is nondistended, soft and nontender. No organomegaly or masses felt. Normal bowel sounds heard. Central nervous system: Non focal Extremities: No C/C/E, +pedal pulses Skin: No rashes, lesions or ulcers; R arm wound wrapped in ace bandage Psychiatry: Unable to fully assess given current mental state.    Data Reviewed: I have personally reviewed following labs and imaging studies  CBC: Recent Labs  Lab 03/21/18 0638 03/22/18 0651 03/23/18 0452 03/24/18 0622 03/25/18 0512  WBC 6.6 5.8 6.5 6.6 6.9  HGB 8.6* 8.6* 8.8* 8.4* 8.6*  HCT 26.0* 25.8* 27.3* 25.6* 26.1*  MCV 89.7 89.6 91.9 91.1 91.6  PLT 172 171 204 207 485   Basic Metabolic Panel: Recent Labs  Lab 03/21/18 0638 03/22/18 0651 03/23/18 0452 03/24/18 0622 03/25/18 0512  NA 140 140 143 140 141  K 4.2 4.2 4.3 4.1 4.2  CL 110 111 115* 112* 113*  CO2 21* 21* 21* 21* 21*  GLUCOSE 93 84 79 90 111*  BUN 38* 34* 32* 33* 36*  CREATININE 3.16* 3.06* 2.91* 3.03* 3.19*  CALCIUM 8.2* 8.2* 8.2* 8.0* 8.2*   GFR: Estimated Creatinine Clearance: 18.2 mL/min (A) (by C-G formula based on SCr of 3.19 mg/dL (H)). Liver Function Tests: No results for input(s): AST, ALT, ALKPHOS, BILITOT, PROT, ALBUMIN in the last 168 hours. No results for input(s): LIPASE, AMYLASE in the last 168 hours. No results for input(s): AMMONIA in the last 168 hours. Coagulation Profile: No results for input(s): INR, PROTIME in the last 168 hours. Cardiac Enzymes: No results for input(s): CKTOTAL, CKMB, CKMBINDEX, TROPONINI in the last 168 hours. BNP  (last 3 results) No results for input(s): PROBNP in the last 8760 hours. HbA1C: No results for input(s): HGBA1C in the last 72 hours. CBG: Recent Labs  Lab 03/26/18 0718 03/26/18 1109 03/26/18 1603  GLUCAP 83 179* 140*   Lipid Profile: No results for input(s): CHOL, HDL, LDLCALC, TRIG, CHOLHDL, LDLDIRECT in the last 72 hours. Thyroid Function Tests: No results for input(s): TSH, T4TOTAL, FREET4, T3FREE, THYROIDAB in the last 72 hours. Anemia Panel: No results for input(s): VITAMINB12, FOLATE, FERRITIN, TIBC, IRON, RETICCTPCT in the last 72 hours. Urine analysis:    Component Value Date/Time   COLORURINE YELLOW 03/15/2018 2110   APPEARANCEUR HAZY (A) 03/15/2018 2110   LABSPEC 1.013 03/15/2018 2110   PHURINE 5.0 03/15/2018 2110   GLUCOSEU NEGATIVE 03/15/2018 2110   HGBUR NEGATIVE 03/15/2018 2110   BILIRUBINUR NEGATIVE 03/15/2018 2110   BILIRUBINUR neg 10/07/2014 1122   KETONESUR NEGATIVE 03/15/2018 2110   PROTEINUR 100 (A) 03/15/2018 2110   UROBILINOGEN negative 10/07/2014 1122   UROBILINOGEN 0.2 04/03/2014 2308   NITRITE NEGATIVE 03/15/2018 2110   LEUKOCYTESUR NEGATIVE 03/15/2018  2110   Sepsis Labs: @LABRCNTIP (procalcitonin:4,lacticidven:4)  )No results found for this or any previous visit (from the past 240 hour(s)).    Radiology Studies: No results found.   Scheduled Meds: . apixaban  5 mg Oral BID  . bisoprolol  10 mg Oral Daily  . calcitRIOL  0.25 mcg Oral Daily  . diltiazem  240 mg Oral Daily  . LORazepam  0.5 mg Oral QHS  . mouth rinse  15 mL Mouth Rinse BID  . pantoprazole  40 mg Oral Daily  . terazosin  2 mg Oral QHS   Continuous Infusions:    LOS: 8 days    Time spent: 35 minutes. Greater than 50% of this time was spent in direct contact with the patient, coordinating care and discussing relevant ongoing clinical issues, including anemia, potential causes and poor overall prognosis.     Pratik Darleen Crocker, DO [facility] Pager  [phone]  If 7PM-7AM, please contact night-coverage [email or web address] Password [facility] 03/26/2018, 4:21 PM

## 2018-03-26 NOTE — Telephone Encounter (Signed)
Don [name] is notified and will call patient

## 2018-03-26 NOTE — Care Management Note (Signed)
Case Management Note  Patient Details  Name: Don [name] MRN: [id number] Date of Birth: 11-28-1940  If discussed at Long Length of Stay Meetings, dates discussed:  03/26/2018  Additional Comments:  Sherald Barge, RN 03/26/2018, 12:13 PM

## 2018-03-26 NOTE — Progress Notes (Signed)
Patient worried about where he is going after discharge.  Tried to reassure him that case and social workers are finding placement.   He denies any suicidal ideations and said that he never did have any and that it was a ridiculous idea.  Helped him make phone call to [facility]

## 2018-03-27 DIAGNOSIS — I10 Essential (primary) hypertension: Secondary | ICD-10-CM

## 2018-03-27 DIAGNOSIS — Z7189 Other specified counseling: Secondary | ICD-10-CM

## 2018-03-27 DIAGNOSIS — N183 Chronic kidney disease, stage 3 (moderate): Secondary | ICD-10-CM

## 2018-03-27 DIAGNOSIS — R5381 Other malaise: Secondary | ICD-10-CM

## 2018-03-27 LAB — CBC
HEMATOCRIT: 27.1 % — AB (ref 39.0–52.0)
HEMOGLOBIN: 8.7 g/dL — AB (ref 13.0–17.0)
MCH: 29.1 pg (ref 26.0–34.0)
MCHC: 32.1 g/dL (ref 30.0–36.0)
MCV: 90.6 fL (ref 78.0–100.0)
Platelets: 262 10*3/uL (ref 150–400)
RBC: 2.99 MIL/uL — AB (ref 4.22–5.81)
RDW: 17.8 % — ABNORMAL HIGH (ref 11.5–15.5)
WBC: 7.1 10*3/uL (ref 4.0–10.5)

## 2018-03-27 LAB — BASIC METABOLIC PANEL
ANION GAP: 3 — AB (ref 5–15)
BUN: 35 mg/dL — ABNORMAL HIGH (ref 6–20)
CALCIUM: 8.2 mg/dL — AB (ref 8.9–10.3)
CO2: 24 mmol/L (ref 22–32)
Chloride: 112 mmol/L — ABNORMAL HIGH (ref 101–111)
Creatinine, Ser: 2.97 mg/dL — ABNORMAL HIGH (ref 0.61–1.24)
GFR calc non Af Amer: 19 mL/min — ABNORMAL LOW (ref >60)
GFR, EST AFRICAN AMERICAN: 22 mL/min — AB (ref >60)
GLUCOSE: 92 mg/dL (ref 65–99)
Potassium: 3.8 mmol/L (ref 3.5–5.1)
SODIUM: 139 mmol/L (ref 135–145)

## 2018-03-27 NOTE — Progress Notes (Signed)
Physical Therapy Treatment Patient Details Name: Don [name] MRN: [id number] DOB: 09/26/1941 Today's Date: 03/27/2018    History of Present Illness Don [name] is a 77 y.o. male with history significant for atrial fibrillation, hypertension, hyperlipidemia, stage III chronic kidney disease, history of prostate cancer who initially presented to the [facility] emergency department on 5/4 with complaints of feeling depressed and suicidal with an actual suicide plan.  He was evaluated by TTS and it was decided that he needed inpatient psychiatric care.  He has been in the emergency department the past 2-1/2 days awaiting psychiatric placement.  Unfortunately it appears that he did not receive any fluids, his home medications were not entirely restarted and he has not been eating or drinking well since presentation.  When EDP today went to assess patient, patient was noticed to have a heart rate anywhere from 1 30-1 50, looked very pale.  Labs were drawn which showed a hemoglobin of 7.3, was 10.2 upon presentation on the fourth, he did perform a rectal exam, stool was brown in color and was only faintly positive for blood.  On blood work his creatinine is 3.32 which is around his baseline, he has stage IV chronic kidney disease, otherwise labs are within normal limits    PT Comments    PT sitting on side of bed as therapist enters room.  Pt agreeable to ambulation.  Pt able to come sit to stand with Mod I .  Ambulated with mod I x 160 ft with one short rest break,(pt stood without holding on to anything).  Pt vocalized fatigue at end of treatment but requested to sit on the side of the bed.  Therapist encouraged pt to sit up in the chair for a while but pt refused.    Follow Up Recommendations  Supervision/Assistance - 24 hour;Home health PT     Equipment Recommendations  None recommended by PT    Recommendations for Other Services       Precautions / Restrictions Precautions Precautions:  None Restrictions Weight Bearing Restrictions: No    Mobility  Bed Mobility Overal bed mobility: Modified Independent                Transfers Overall transfer level: Modified independent   Transfers: Sit to/from Stand;Stand Pivot Transfers Sit to Stand: Modified independent (Device/Increase time) Stand pivot transfers: Modified independent (Device/Increase time)       General transfer comment: has to lean on nearby objects for support and unsteady  Ambulation/Gait Ambulation/Gait assistance: Modified independent (Device/Increase time) Ambulation Distance (Feet): 160 Feet Assistive device: None Gait Pattern/deviations: Decreased step length - right;Decreased step length - left Gait velocity: slow   General Gait Details: PT did not need to lean on walls with therapist today.          Cognition Arousal/Alertness: Awake/alert Behavior During Therapy: WFL for tasks assessed/performed Overall Cognitive Status: Within Functional Limits for tasks assessed                                               Pertinent Vitals/Pain Pain Assessment: No/denies pain                   PT Goals (current goals can now be found in the care plan section) Acute Rehab PT Goals PT Goal Formulation: With patient Potential to Achieve Goals: Good Progress towards  PT goals: Progressing toward goals    Frequency    Min 3X/week      PT Plan Current plan remains appropriate       AM-PAC PT "6 Clicks" Daily Activity  Outcome Measure  Difficulty turning over in bed (including adjusting bedclothes, sheets and blankets)?: None Difficulty moving from lying on back to sitting on the side of the bed? : None Difficulty sitting down on and standing up from a chair with arms (e.g., wheelchair, bedside commode, etc,.)?: None Help needed moving to and from a bed to chair (including a wheelchair)?: A Little Help needed walking in hospital room?: A Little Help needed  climbing 3-5 steps with a railing? : A Little 6 Click Score: 21    End of Session Equipment Utilized During Treatment: Gait belt Activity Tolerance: Patient tolerated treatment well;Patient limited by fatigue Patient left: with call bell/phone within reach;in bed;with bed alarm set(PT sitting bedside ) Nurse Communication: Mobility status PT Visit Diagnosis: Unsteadiness on feet (R26.81);Muscle weakness (generalized) (M62.81)     Time: 7116-5790 PT Time Calculation (min) (ACUTE ONLY): 29 min  Charges:  $Gait Training: 8-22 mins              Rayetta Humphrey, PT CLT [phone] 03/27/2018, 8:59 AM

## 2018-03-27 NOTE — Progress Notes (Signed)
PROGRESS NOTE    Don [name]  PYP:[id number] DOB: 1941-08-25 DOA: 03/16/2018 PCP: Dettinger, Fransisca Kaufmann, MD     Brief Narrative:  77 year old man admitted on 5/6.  He had been awaiting emergent psychiatric placement due to major depression with suicidal ideation with an active suicide plan.  When he was evaluated by ED physician on day of admission he was noted to be pale, weak with a hemoglobin of 7.3.  As of 5/7 his hemoglobin has further dropped to 5.6, FOBT is positive.  He is now status post 2 unit PRBCs with good stability noted.  GI performed EGD with findings of some gastritis with erosions and some biopsies have been obtained.   Assessment & Plan:   Principal Problem:   Atrial fibrillation with RVR (HCC) Active Problems:   Hypertension   Heme positive stool   Stage III chronic kidney disease (HCC)   Hyperlipidemia LDL goal <130   Normocytic anemia   Suicidal ideation   Major depression   Palliative care by specialist   Goals of care, counseling/discussion   DNR (do not resuscitate) discussion   Avulsion of skin of right forearm   Fall   Atrial fibrillation with rapid ventricular response-resolved -I suspect RVR due to a combination of blood loss/dehydration in addition to his rate controlling medications being held.  -Continue bisoprolol and Cardizem added increased dosed per cardiology. Good control noted. F/U outpatient in 2 weeks per Cardiology recommendations. -Continue Eliquis  Normocytic anemia-improved s/p 2U PRBC transfusion; stable -Definitely a chronic component due to chronic kidney disease but do suspect an acute blood loss anemia as well especially given rapidity of decreased hemoglobin in addition to positive fecal occult blood. -Supratherapeutic INR has normalized after vitamin K administration. -Appreciate GI evaluation with plans for EGD as an outpatient in the near future.  -Appreciate palliative care consultation -Continue CBC monitoring; H/H has  been stable  Right arm wound -Appreciate surgery evaluation -continue wound care as instructed  Stage IV CKD -At baseline his Cr is between 2.3-2.5. -Remains at baseline approximately -Needs OP follow up with nephrology as he is close to needing HD. -No acute indications for HD. -No symptomatology noted; good appetite with no nausea or vomiting  Hyperlipidemia -continue statin -low at diet encouraged.   Acute Hypoxemic Respiratory Failure-improved -Chest x-ray with no acute findings, he is currently on room air.    Depression with Suicidal Ideation-resolved -Cleared by TTS for discharge to assisted living facility -No further ideations currently noted   DVT prophylaxis: SCDs Code Status: full code Family Communication: no family at bedside  Disposition Plan: patient with good participation with PT.  Recommendations included 24/7 assistance and supervision; [facility] services.  Consultants:   GI  TTS  Cardiology  General surgery  Procedures:   none  Antimicrobials:  Anti-infectives (From admission, onward)   Start     Dose/Rate Route Frequency Ordered Stop   03/16/18 2200  cephALEXin (KEFLEX) capsule 500 mg     500 mg Oral  Once 03/16/18 2147 03/16/18 2158   03/16/18 0000  cephALEXin (KEFLEX) 500 MG capsule     500 mg Oral 3 times daily 03/16/18 2237        Subjective: In no acute distress. Afebrile, denies CP, SOB and palpitations.  Objective: Vitals:   03/27/18 1317 03/27/18 1824 03/27/18 2023 03/27/18 2029  BP: 136/75  (!) 147/87   Pulse: 94  (!) 104   Resp: 18  18   Temp: 98 F (36.7 C)  98.5 F (36.9 C)   TempSrc: Oral  Oral   SpO2: 94%  97% 95%  Weight:  71.8 kg (158 lb 4.6 oz)    Height:        Intake/Output Summary (Last 24 hours) at 03/27/2018 2305 Last data filed at 03/27/2018 1800 Gross per 24 hour  Intake 720 ml  Output -  Net 720 ml   Filed Weights   03/16/18 1941 03/18/18 1704 03/27/18 1824  Weight: 73.5 kg (162 lb) 67.4 kg (148 lb  9.4 oz) 71.8 kg (158 lb 4.6 oz)    Examination:  General exam: in no distress; AAOX3 and no complaining or CP or palpitations. Reports improvement in his right arm wound and is eating better. He feels stronger and have good participation with PT. Respiratory system: good air movement bilaterally, no wheezing, no crackles. Cardiovascular system:rate controlled, no rubs, no gallops. Gastrointestinal system: soft, NT, ND, positive BS Central nervous system: CN intact, no focal deifcit, moving four limbs spontaneously. Extremities: no edema, no cyanosis, no clubbing.  Skin: no petechiae, open wound in his right forearm, w/o drainage, appropriately healing. Clean dressing in place.  Psychiatry: no SI or hallucination; overall stable and properly following commands.   Data Reviewed: I have personally reviewed following labs and imaging studies  CBC: Recent Labs  Lab 03/22/18 0651 03/23/18 0452 03/24/18 0622 03/25/18 0512 03/27/18 0435  WBC 5.8 6.5 6.6 6.9 7.1  HGB 8.6* 8.8* 8.4* 8.6* 8.7*  HCT 25.8* 27.3* 25.6* 26.1* 27.1*  MCV 89.6 91.9 91.1 91.6 90.6  PLT 171 204 207 232 509   Basic Metabolic Panel: Recent Labs  Lab 03/22/18 0651 03/23/18 0452 03/24/18 0622 03/25/18 0512 03/27/18 0435  NA 140 143 140 141 139  K 4.2 4.3 4.1 4.2 3.8  CL 111 115* 112* 113* 112*  CO2 21* 21* 21* 21* 24  GLUCOSE 84 79 90 111* 92  BUN 34* 32* 33* 36* 35*  CREATININE 3.06* 2.91* 3.03* 3.19* 2.97*  CALCIUM 8.2* 8.2* 8.0* 8.2* 8.2*   GFR: Estimated Creatinine Clearance: 20.5 mL/min (A) (by C-G formula based on SCr of 2.97 mg/dL (H)).  CBG: Recent Labs  Lab 03/26/18 0718 03/26/18 1109 03/26/18 1603 03/26/18 2108  GLUCAP 83 179* 140* 136*   Urine analysis:    Component Value Date/Time   COLORURINE YELLOW 03/15/2018 2110   APPEARANCEUR HAZY (A) 03/15/2018 2110   LABSPEC 1.013 03/15/2018 2110   PHURINE 5.0 03/15/2018 2110   GLUCOSEU NEGATIVE 03/15/2018 2110   HGBUR NEGATIVE  03/15/2018 2110   BILIRUBINUR NEGATIVE 03/15/2018 2110   BILIRUBINUR neg 10/07/2014 1122   KETONESUR NEGATIVE 03/15/2018 2110   PROTEINUR 100 (A) 03/15/2018 2110   UROBILINOGEN negative 10/07/2014 1122   UROBILINOGEN 0.2 04/03/2014 2308   NITRITE NEGATIVE 03/15/2018 2110   LEUKOCYTESUR NEGATIVE 03/15/2018 2110    Radiology Studies: No results found.   Scheduled Meds: . apixaban  5 mg Oral BID  . bisoprolol  10 mg Oral Daily  . calcitRIOL  0.25 mcg Oral Daily  . diltiazem  240 mg Oral Daily  . LORazepam  0.5 mg Oral QHS  . mouth rinse  15 mL Mouth Rinse BID  . pantoprazole  40 mg Oral Daily  . terazosin  2 mg Oral QHS     LOS: 9 days    Time spent: 30 minutes.     Barton Dubois, MD [facility] Pager [phone].  If 7PM-7AM, please contact night-coverage [email or web address] Password [facility] 03/27/2018, 11:05 PM

## 2018-03-27 NOTE — Progress Notes (Signed)
[facility]  Patient with right arm avulsion wound.    Granulating the dermis that was exposed, eschar from dead epidermis, leave in place. No signs of infection.  Continue xeroform and kerlix/ ace to arm daily. Follow up with PCP at discharge.    Curlene Labrum, MD [facility] [address] [place], [place] [address] [phone] (office)

## 2018-03-27 NOTE — Progress Notes (Signed)
Initial Nutrition Assessment  DOCUMENTATION CODES:      INTERVENTION:  JUVEN BID   Multivitamin daily  RD will continue to follow  NUTRITION DIAGNOSIS:   Increased nutrient needs related to wound healing(acute injury to right forearm due to fall) as evidenced by estimated needs.  GOAL:   Patient will meet greater than or equal to 90% of their needs(to prevent further weight loss and promote wound healing)   MONITOR:   PO intake, Supplement acceptance, Weight trends, Skin, Labs   REASON FOR ASSESSMENT:   LOS   ASSESSMENT:  Patient is a 77 yo male who has major depression, COPD, CKD-3, Pre-daibetes. RD drawn to pt due to length of stay.    Patient is eating 75-100% of meals per nursing. Patient says he has been alternating soft foods and liquids (jucie, celery soup). His diet has been fully advanced since 5/10- discussed with nutrition services who report pt has been receiving regular heart healthy tray and eating well regular texture foods each meal. Patient says he is able to feed himself despite his injured arm.   Patient initial weight indicated significant loss however, when re-weight was obtained shows mild loss from 162 lb on 4/22 down 158.4 lb on admission (2%) ~4 lb in < 2 weeks. Not significant for timeframe.  Discussed patient status with nursing.   Labs: BMP Latest Ref Rng & Units 03/27/2018 03/25/2018 03/24/2018  Glucose 65 - 99 mg/dL 92 111(H) 90  BUN 6 - 20 mg/dL 35(H) 36(H) 33(H)  Creatinine 0.61 - 1.24 mg/dL 2.97(H) 3.19(H) 3.03(H)  BUN/Creat Ratio 10 - 24 - - -  Sodium 135 - 145 mmol/L 139 141 140  Potassium 3.5 - 5.1 mmol/L 3.8 4.2 4.1  Chloride 101 - 111 mmol/L 112(H) 113(H) 112(H)  CO2 22 - 32 mmol/L 24 21(L) 21(L)  Calcium 8.9 - 10.3 mg/dL 8.2(L) 8.2(L) 8.0(L)    Meds: . apixaban  5 mg Oral BID  . bisoprolol  10 mg Oral Daily  . calcitRIOL  0.25 mcg Oral Daily  . diltiazem  240 mg Oral Daily  . LORazepam  0.5 mg Oral QHS  . mouth rinse  15 mL  Mouth Rinse BID  . pantoprazole  40 mg Oral Daily  . terazosin  2 mg Oral QHS    Past Medical History:  Diagnosis Date  . Acute bronchitis 04/03/2014  . Anxiety   . Atrial fibrillation ([place])   . Cataract   . COPD (chronic obstructive pulmonary disease) ([place])   . Essential hypertension   . Hyperlipidemia   . Macular degeneration   . Noncompliance with medications 04/2013   Xarelto, digoxin previously  . Pre-diabetes   . Prostate cancer ([place])   . Stage III chronic kidney disease 04/03/2014  . Tubular adenoma of colon 07/31/02, 11/18/03     NUTRITION - FOCUSED PHYSICAL EXAM:    Most Recent Value  Orbital Region  No depletion  Upper Arm Region  Moderate depletion  Thoracic and Lumbar Region  Mild depletion  Buccal Region  No depletion  Temple Region  Mild depletion  Clavicle Bone Region  Mild depletion  Clavicle and Acromion Bone Region  Moderate depletion  Dorsal Hand  No depletion  Patellar Region  Mild depletion  Anterior Thigh Region  Unable to assess  Posterior Calf Region  No depletion  Hair  Reviewed  Skin  Reviewed  Nails  Reviewed     Diet Order:   Diet Order  Diet Heart Room service appropriate? Yes; Fluid consistency: Thin  Diet effective now         EDUCATION NEEDS:   No education needs have been identified at this timeSkin:  Skin Assessment: Skin Integrity Issues: Skin Integrity Issues:: (pt fell on gravel and which resulted in significant injury to right  arm. )  Last BM:  03/26/18 small per pt  Height:   Ht Readings from Last 1 Encounters:  03/18/18 5\' 9"  (1.753 m)    Weight:   Wt Readings from Last 1 Encounters:  03/27/18 158 lb 4.6 oz (71.8 kg)    Ideal Body Weight:  73 kg  BMI:  Body mass index is 23.38 kg/m.  Estimated Nutritional Needs:   Kcal:  9458-5929 (25-30 kcal/kg)  Protein:  56-63 gr (0.8-0.9 gr/kg)  Fluid:  1.8-2.1 liters   Colman Cater MS,RD,CSG,LDN Office: [phone] Pager: [phone]

## 2018-03-27 NOTE — Care Management Note (Signed)
Case Management Note  Patient Details  Name: ELIAV MECHLING MRN: [id number] Date of Birth: 1941-03-07  Expected Discharge Date:    03/28/18              Expected Discharge Plan:  [place]  In-House Referral:  Clinical Social Work  Discharge planning Services  CM Consult  Post Acute Care Choice:  Home Health Choice offered to:     DME Arranged:    DME Agency:     HH Arranged:    [place] Agency:     Status of Service:  In process, will continue to follow  If discussed at Long Length of Stay Meetings, dates discussed:    Additional Comments: CM spoke with POA [name]) about PT recommendation made today and the reality that [facility] will not approve SNF for STR. Pt can not afford ALF, he would have to sell his property. [name] has spoken with DSS. Bruse believes the pt wants more than being alone but also thinks Mr. Litton will get somewhere and change his mind that is why he is reluctant to sell everything to soon. CM discussed potentially opportunity of independent living. [name] and patient are excited about that option. [facility] in [place] offers a trail stay where he could pay by the day. CM has called [facility], with pt's permission. They will coordinate a meeting time with [name] for tomorrow morning and pt could DC there tomorrow after that meeting. Plan discussed with patient, POA, CSW and MD.  Sherald Barge, RN 03/27/2018, 3:09 PM

## 2018-03-28 MED ORDER — ADULT MULTIVITAMIN W/MINERALS CH
1.0000 | ORAL_TABLET | Freq: Every day | ORAL | Status: DC
  Administered 2018-03-28 – 2018-03-29 (×2): 1 via ORAL
  Filled 2018-03-28 (×2): qty 1

## 2018-03-28 MED ORDER — JUVEN PO PACK
1.0000 | PACK | Freq: Two times a day (BID) | ORAL | Status: DC
  Administered 2018-03-28 – 2018-03-29 (×3): 1 via ORAL
  Filled 2018-03-28 (×3): qty 1

## 2018-03-28 NOTE — Clinical Social Work Note (Signed)
LCSW met with step son, Don [name], and discussed patient's PT assessment on yesterday. LCSW discussed that at this time it is recommended that patient receive HHPT rather than SNF due to his functioning. LCSW discussed that Don [name] had made a bed offer, however insurance authorization may be difficult due to patient not being recommended for SNF. LCSW discussed independent living plan that CM has previously arranged. Don [name] indicated that he is still considering that as an option. LCSW discussed that the facility would be the seeking SNF authorization however receiving authorization was uncertain.   Nayda Riesen, Clydene Pugh, LCSW

## 2018-03-28 NOTE — Progress Notes (Signed)
PROGRESS NOTE    Don [name]  LZJ:[id number] DOB: [birth year] DOA: 03/16/2018 PCP: Dettinger, Fransisca Kaufmann, MD     Brief Narrative:  77 year old man admitted on 5/6.  He had been awaiting emergent psychiatric placement due to major depression with suicidal ideation with an active suicide plan.  When he was evaluated by ED physician on day of admission he was noted to be pale, weak with a hemoglobin of 7.3.  As of 5/7 his hemoglobin has further dropped to 5.6, FOBT is positive.  He is now status post 2 unit PRBCs with good stability noted.  GI performed EGD with findings of some gastritis with erosions and some biopsies have been obtained.   Assessment & Plan:   Principal Problem:   Atrial fibrillation with RVR (HCC) Active Problems:   Hypertension   Heme positive stool   Stage III chronic kidney disease (HCC)   Hyperlipidemia LDL goal <130   Normocytic anemia   Suicidal ideation   Major depression   Palliative care by specialist   Goals of care, counseling/discussion   DNR (do not resuscitate) discussion   Avulsion of skin of right forearm   Fall   Atrial fibrillation with rapid ventricular response-resolved -I suspect RVR due to a combination of blood loss/dehydration in addition to his rate controlling medications being held.  -Continue bisoprolol and Cardizem (last one with dose adjustment as per cardiology rec's) -Good control noted.  -F/U outpatient in 2 weeks after discharge as per Cardiology recommendations. -Continue Eliquis -CHADsVASC score 4  Normocytic anemia-improved s/p 2U PRBC transfusion; stable -Definitely a chronic component due to chronic kidney disease and most likely component of an acute blood loss anemia as well.  -patient positive FOBT. -Supratherapeutic INR has normalized after vitamin K administration. -Appreciate GI evaluation with plans for EGD as an outpatient in the near future.  -Appreciate palliative care consultation.  -Continue CBC  intermittently to monitor Hgb trend.   Right arm wound -Appreciate surgery evaluation -continue wound care as instructed -no signs of superimposed infection.  Stage IV CKD -At baseline his Cr is between 2.3-2.5. -remains stable.  -Needs OP follow up with nephrology as he is close to needing HD in the future.. -No symptomatology noted; good appetite with no nausea or vomiting. -reports good urine output  Hyperlipidemia -continue statin -low at diet encouraged.   Acute Hypoxemic Respiratory Failure-improved -Chest x-ray with no acute findings, he is currently on room air and with good O2 sat -will continue to monitor VS.  Depression with Suicidal Ideation-resolved -Cleared by TTS for discharge -No further suicidal ideations, hallucinations or intentions to harm others noted  Physical deconditioning -patient needs 24 hour supervision and assistance -unable to be provided currently -will try to pursuit short term rehab at SNF, with subsequent discharge home with [facility] services.   DVT prophylaxis: SCDs Code Status: full code Family Communication: no family at bedside  Disposition Plan: patient with good participation with PT.  Recommendations included 24/7 assistance and supervision; no social availability for 24 hours assistance currently. Needs rehab for safety prior to discharge.  Consultants:   GI  TTS  Cardiology  General surgery  Procedures:   none  Antimicrobials:  Anti-infectives (From admission, onward)   Start     Dose/Rate Route Frequency Ordered Stop   03/16/18 2200  cephALEXin (KEFLEX) capsule 500 mg     500 mg Oral  Once 03/16/18 2147 03/16/18 2158   03/16/18 0000  cephALEXin (KEFLEX) 500 MG capsule  500 mg Oral 3 times daily 03/16/18 2237        Subjective: Afebrile, feeling good and in no distress. Denies CP and palpitations.  Objective: Vitals:   03/27/18 2029 03/28/18 0408 03/28/18 1150 03/28/18 1503  BP:  (!) 138/53  131/87  Pulse:   (!) 106  (!) 102  Resp:  18  17  Temp:  98 F (36.7 C)  97.6 F (36.4 C)  TempSrc:  Oral  Oral  SpO2: 95% 98%  99%  Weight:   66.5 kg (146 lb 11.2 oz)   Height:        Intake/Output Summary (Last 24 hours) at 03/28/2018 1650 Last data filed at 03/28/2018 1109 Gross per 24 hour  Intake 600 ml  Output -  Net 600 ml   Filed Weights   03/18/18 1704 03/27/18 1824 03/28/18 1150  Weight: 67.4 kg (148 lb 9.4 oz) 71.8 kg (158 lb 4.6 oz) 66.5 kg (146 lb 11.2 oz)    Examination:  General exam: No fever, no chest pain, no palpitations.  Patient no acute distress alert, awake and oriented x3.   Respiratory system: Good air movement bilaterally, no wheezing, no crackles. Cardiovascular system: Rate control, no rubs, no gallops, no JVD. Gastrointestinal system: Soft, nontender, nondistended, positive bowel sounds.   Central nervous system: Cranial nerves intact, no focal deficits, following commands appropriately and answering questions with good insight. Extremities: No edema, no cyanosis, no clubbing.   Skin: no petechiae, no rash, open wound in his right arm w/o signs of superimposed infection.clean dressing and ACE wrap in place. Psychiatry: no SI, no hallucinations. Mood stable.   Data Reviewed: I have personally reviewed following labs and imaging studies  CBC: Recent Labs  Lab 03/22/18 0651 03/23/18 0452 03/24/18 0622 03/25/18 0512 03/27/18 0435  WBC 5.8 6.5 6.6 6.9 7.1  HGB 8.6* 8.8* 8.4* 8.6* 8.7*  HCT 25.8* 27.3* 25.6* 26.1* 27.1*  MCV 89.6 91.9 91.1 91.6 90.6  PLT 171 204 207 232 063   Basic Metabolic Panel: Recent Labs  Lab 03/22/18 0651 03/23/18 0452 03/24/18 0622 03/25/18 0512 03/27/18 0435  NA 140 143 140 141 139  K 4.2 4.3 4.1 4.2 3.8  CL 111 115* 112* 113* 112*  CO2 21* 21* 21* 21* 24  GLUCOSE 84 79 90 111* 92  BUN 34* 32* 33* 36* 35*  CREATININE 3.06* 2.91* 3.03* 3.19* 2.97*  CALCIUM 8.2* 8.2* 8.0* 8.2* 8.2*   GFR: Estimated Creatinine Clearance:  19.3 mL/min (A) (by C-G formula based on SCr of 2.97 mg/dL (H)).  CBG: Recent Labs  Lab 03/26/18 0718 03/26/18 1109 03/26/18 1603 03/26/18 2108  GLUCAP 83 179* 140* 136*   Urine analysis:    Component Value Date/Time   COLORURINE YELLOW 03/15/2018 2110   APPEARANCEUR HAZY (A) 03/15/2018 2110   LABSPEC 1.013 03/15/2018 2110   PHURINE 5.0 03/15/2018 2110   GLUCOSEU NEGATIVE 03/15/2018 2110   HGBUR NEGATIVE 03/15/2018 2110   BILIRUBINUR NEGATIVE 03/15/2018 2110   BILIRUBINUR neg 10/07/2014 1122   KETONESUR NEGATIVE 03/15/2018 2110   PROTEINUR 100 (A) 03/15/2018 2110   UROBILINOGEN negative 10/07/2014 1122   UROBILINOGEN 0.2 04/03/2014 2308   NITRITE NEGATIVE 03/15/2018 2110   LEUKOCYTESUR NEGATIVE 03/15/2018 2110    Radiology Studies: No results found.   Scheduled Meds: . apixaban  5 mg Oral BID  . bisoprolol  10 mg Oral Daily  . calcitRIOL  0.25 mcg Oral Daily  . diltiazem  240 mg Oral Daily  . LORazepam  0.5 mg Oral QHS  . mouth rinse  15 mL Mouth Rinse BID  . multivitamin with minerals  1 tablet Oral Daily  . nutrition supplement (JUVEN)  1 packet Oral BID BM  . pantoprazole  40 mg Oral Daily  . terazosin  2 mg Oral QHS     LOS: 10 days    Time spent: 30 minutes.   Barton Dubois, MD [facility] Pager [phone].  If 7PM-7AM, please contact night-coverage [email or web address] Password [email or web address] 03/28/2018, 4:50 PM

## 2018-03-28 NOTE — Progress Notes (Signed)
PT Cancellation Note  Patient Details Name: Don [name] MRN: [id number] DOB: Jun 05, 1941   Cancelled Treatment:    Reason Eval/Treat Not Completed: Other (comment)(Attempted PT session, visitor reports meeting going on and requested to come later is available)  Ihor Austin, [place]; [place]  Aldona Lento 03/28/2018, 1:15 PM

## 2018-03-28 NOTE — Care Management Note (Signed)
Case Management Note  Patient Details  Name: Don [name] MRN: [id number] Date of Birth: 1941-06-27   Expected Discharge Date:     03/29/2018             Expected Discharge Plan:  [place]  In-House Referral:  Clinical Social Work  Discharge planning Services  CM Consult  Post Acute Care Choice:  Home Health Choice offered to:  Patient  HH Arranged:  RN, PT, Nurse's Aide, Social Work [employer] Agency:  [place]  Status of Service:  Completed, signed off  If discussed at [employer], dates discussed: 03/29/2018   Additional Comments:  Pt ready for DC. Met with son and Engineer, structural. Ind living ready to receive patient today if discharged. Son has concerns about patient not going to SNF first. Does not feel he is ready. Son asks how we can DC someone not having them walk first. CM assured Don [name] that PT has walked with pt regularly since his admission and has been ambulating. Don [name] has questions about pt's med rec and falls. CM asked that he discuss that with MD. [name] asked to speak with CSW regarding insurance authorization. CSW aware of Don [name]'s request.  Authorization pending but with no recommendation for SNF Don [name] understands SNF will not be approved. He wants to try anyway. CM met with patient who is excited about Ind Living at [facility]. He says he lets Don [name] made decisions. We discuss the cost of paying OOP for SNF vs [facility]. Pt says he can not afford to pay for SNF OOP. Pt says he is just ready to leave the hospital, tired of sitting here. He wants to ambulate, he wants to put on his own clothes. Requests CM tell Don [name] to bring him clothes. If medically ready pt should be discharged. Son will have to chose option of paying privately for SNF or independent living with [facility] services. [place] referral has been made to [facility] rep, pt has chosen provider from list of options. Juliann Pulse, [facility] rep, will pull pt info from chart. Pt and Don [name] aware HH has  48 hrs to make first visit.  Sherald Barge, RN 03/28/2018, 1:19 PM

## 2018-03-29 DIAGNOSIS — D500 Iron deficiency anemia secondary to blood loss (chronic): Secondary | ICD-10-CM

## 2018-03-29 DIAGNOSIS — F322 Major depressive disorder, single episode, severe without psychotic features: Secondary | ICD-10-CM

## 2018-03-29 DIAGNOSIS — E785 Hyperlipidemia, unspecified: Secondary | ICD-10-CM

## 2018-03-29 DIAGNOSIS — Z23 Encounter for immunization: Secondary | ICD-10-CM | POA: Diagnosis not present

## 2018-03-29 DIAGNOSIS — W19XXXD Unspecified fall, subsequent encounter: Secondary | ICD-10-CM

## 2018-03-29 MED ORDER — BISOPROLOL FUMARATE 10 MG PO TABS: 10.0000 mg | ORAL_TABLET | Freq: Every day | ORAL | 1 refills | Status: DC

## 2018-03-29 MED ORDER — DILTIAZEM HCL ER COATED BEADS 240 MG PO CP24: 240.0000 mg | ORAL_CAPSULE | Freq: Every day | ORAL | 1 refills | Status: DC

## 2018-03-29 MED ORDER — APIXABAN 5 MG PO TABS: 5.0000 mg | ORAL_TABLET | Freq: Two times a day (BID) | ORAL | 1 refills | Status: DC

## 2018-03-29 NOTE — Care Management Important Message (Signed)
Important Message  Patient Details  Name: Don [name] MRN: [id number] Date of Birth: 1941-09-08   Medicare Important Message Given:  Yes    [name] 03/29/2018, 11:06 AM

## 2018-03-29 NOTE — Discharge Summary (Signed)
Physician Discharge Summary  Don [name] OJJ:[id number] DOB: 09-14-1941 DOA: 03/16/2018  PCP: Dettinger, Fransisca Kaufmann, MD  Admit date: 03/16/2018 Discharge date: 03/29/2018  Time spent: 35 minutes  Recommendations for Outpatient Follow-up:  1. Repeat basic metabolic panel to follow electrolytes and renal function 2. Reassess BP and adjust antihypertensive regimen as needed  3. Outpatient follow-up with cardiology to further adjust medication for his paroxysmal atrial fibrillation.   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  Patient discharged with instructions to follow-up with PCP in 10 days and also to follow-up with cardiology as previously instructed.  He will also benefit of psychiatry services as an outpatient for further management of his underlying mood disorder.  Diet recommendation: Heart healthy diet  Filed Weights   03/18/18 1704 03/27/18 1824 03/28/18 1150  Weight: 67.4 kg (148 lb 9.4 oz) 71.8 kg (158 lb 4.6 oz) 66.5 kg (146 lb 11.2 oz)    History of present illness:  As per H&P written by Dr. Jerilee Hoh on 03/18/2018. 77 y.o. male with history significant for atrial fibrillation, hypertension, hyperlipidemia, stage III chronic kidney disease, history of prostate cancer who initially presented to the [facility] emergency department on 5/4 with complaints of feeling depressed and suicidal with an actual suicide plan.  He was evaluated by TTS and it was decided that he needed inpatient psychiatric care.  He has been in the emergency department the  past 2-1/2 days awaiting psychiatric placement.  Unfortunately it appears that he did not receive any fluids, his home medications were not entirely restarted and he has not been eating or drinking well since presentation.  When EDP today went to assess patient, patient was noticed to have a heart rate anywhere from 1 30-1 50, looked very pale.  Labs were drawn which showed a hemoglobin of 7.3, was 10.2 upon presentation on the fourth, he did perform a rectal exam, stool was brown in color and was only faintly positive for blood.  On blood work his creatinine is 3.32 which is around his baseline, he has stage IV chronic kidney disease, otherwise labs are within normal limits.  EKG confirms A. fib with RVR.  EDP ordered a 500 cc saline bolus and has started fluids at 150 cc an hour.  By the time I see him he has already received about a liter of fluids and his heart rate is somewhat improved to the 120s, but he does state that he is feeling weak and a little short of breath with minimal exertion.  I am asked to admit him for further evaluation and management.  Hospital Course:  Atrial fibrillation with rapid ventricular response-resolved -I suspect RVR due to a combination of blood loss/dehydration in addition to his rate controlling medications being held.  -Continue bisoprolol and Cardizem (last one with dose adjustment as per cardiology rec's) -Good control noted.  -F/U outpatient in 2 weeks after discharge as per Cardiology recommendations. -Continue Eliquis -CHADsVASC score 4  Normocytic anemia-improved s/p 2U PRBC transfusion; stable -Definitely a chronic component due to chronic  kidney disease and most likely component of an acute blood loss anemia as well.  -patient positive FOBT. -Supratherapeutic INR has normalized after vitamin K administration. -Appreciate GI evaluation with plans for EGD as an outpatient in the near future.  -Appreciate palliative care consultation.  -Continue CBC  intermittently to monitor Hgb trend.   Right arm wound -Appreciate surgery evaluation -continue wound care as instructed -no signs of superimposed infection.  Stage III-IV CKD -At baseline his Cr is between 2.3-2.5. -remains stable.  -Needs OP follow up with nephrology as he is close to needing HD in the future.. -No symptomatology noted; good appetite with no nausea or vomiting. -reports good urine output  Hyperlipidemia -continue statin -low at diet encouraged.   Acute Hypoxemic Respiratory Failure-improved/resolved -Chest x-ray with no acute findings, he is currently on room air and with good O2 sat  Depression with Suicidal Ideation-resolved -Cleared by TTS for discharge -No further suicidal ideations, hallucinations or intentions to harm others at this moment -will need outpatient psychiatry follow up and further treatment.  Physical deconditioning -patient needs 24 hour supervision and assistance -unable to be provided by family currently -will try to pursuit short term rehab at SNF, with subsequent discharge home with [facility] services. -CM and social worker team to assist as much as possible -patient medically stable to discharge to next venue; family deciding as he will required to pay privately for SNF or independent living options.   Chronic diastolic HF -most likely associated with A. Fib -low sodium diet and daily weight encourage -no signs of fluid overload -continue B-blocker   Procedures:  See below for x-ray reports.  Consultations:  Psychiatry  Gastroenterology  Cardiology  General surgery.  Discharge Exam: Vitals:   03/28/18 2216 03/29/18 0628  BP: 121/79 109/63  Pulse: 99 93  Resp: 20 15  Temp: 98.5 F (36.9 C) 98.3 F (36.8 C)  SpO2: 95% 95%   General exam: No fever, no chest pain, no palpitations.  Patient no acute distress alert, awake and oriented x3.   Respiratory system: Good air movement bilaterally, no wheezing, no  crackles. Cardiovascular system: Rate control, no rubs, no gallops, no JVD. Gastrointestinal system: Soft, nontender, nondistended, positive bowel sounds.   Central nervous system: Cranial nerves intact, no focal deficits, following commands appropriately and answering questions with good insight. Extremities: No edema, no cyanosis, no clubbing.   Skin: no petechiae, no rash, open wound in his right arm w/o signs of superimposed infection.clean dressing and ACE wrap in place. Psychiatry: no SI, no hallucinations. Mood stable.  Discharge Instructions   Discharge Instructions    (HEART FAILURE PATIENTS) Call MD:  Anytime you have any of the following symptoms: 1) 3 pound weight gain in 24 hours or 5 pounds in 1 week 2) shortness of breath, with or without a dry hacking cough 3) swelling in the hands, feet or stomach 4) if you have to sleep on extra pillows at night in order to breathe.   Complete by:  As directed    Diet - low sodium heart healthy   Complete by:  As directed    Discharge instructions   Complete by:  As directed    Take medications as prescribed Arrange follow-up with PCP in 10 days Please make sure to arrange follow-up with psychiatry service as recommended Follow Heart healthy diet Keep yourself well-hydrated     Allergies as of 03/29/2018      Reactions   Wellbutrin [bupropion] Anxiety  Medication List    STOP taking these medications   clonazePAM 1 MG tablet Commonly known as:  KLONOPIN   diltiazem 180 MG 24 hr capsule Commonly known as:  DILACOR XR Replaced by:  diltiazem 240 MG 24 hr capsule     TAKE these medications   apixaban 5 MG Tabs tablet Commonly known as:  ELIQUIS Take 1 tablet (5 mg total) by mouth 2 (two) times daily.   atorvastatin 20 MG tablet Commonly known as:  LIPITOR TAKE 1 TABLET DAILY   bisoprolol 10 MG tablet Commonly known as:  ZEBETA Take 1 tablet (10 mg total) by mouth daily. Start taking on:  03/30/2018 What changed:   See the new instructions.   calcitRIOL 0.25 MCG capsule Commonly known as:  ROCALTROL Take 1 capsule (0.25 mcg total) by mouth daily.   diltiazem 240 MG 24 hr capsule Commonly known as:  CARDIZEM CD Take 1 capsule (240 mg total) by mouth daily. Start taking on:  03/30/2018 Replaces:  diltiazem 180 MG 24 hr capsule   iron polysaccharides 150 MG capsule Commonly known as:  NIFEREX Take 150 mg by mouth daily.   loratadine 10 MG tablet Commonly known as:  CLARITIN Take 10 mg by mouth daily.   pantoprazole 40 MG tablet Commonly known as:  PROTONIX Take 40 mg by mouth 2 (two) times daily.   terazosin 2 MG capsule Commonly known as:  HYTRIN Take 2 mg by mouth at bedtime.      Allergies  Allergen Reactions  . Wellbutrin [Bupropion] Anxiety    Contact information for follow-up providers    [facility]-Home Follow up.   Specialty:  Home Health Services Contact information: [address] [place] [address] [phone]        Dettinger, Fransisca Kaufmann, MD. Schedule an appointment as soon as possible for a visit in 10 day(s).   Specialties:  Family Medicine, Cardiology Contact information: [place] [address] [phone]        Arnoldo Lenis, MD. Call.   Specialty:  Cardiology Why:  office for appointment details.  Contact information: [place] [address] [phone]            Contact information for after-discharge care    Destination    [facility] .   Service:  Skilled Nursing Contact information: [place] [phone]                  The results of significant diagnostics from this hospitalization (including imaging, microbiology, ancillary and laboratory) are listed below for reference.    Significant Diagnostic Studies: Ct Abdomen Pelvis Wo Contrast  Result Date: 03/19/2018 CLINICAL DATA:  Lung mass in LEFT lower lobe question  bronchogenic carcinoma, history prostate cancer, prior retropubic prostatectomy, tubular adenoma of the colon, stage III chronic kidney disease, COPD, atrial fibrillation, hypertension EXAM: CT ABDOMEN AND PELVIS WITHOUT CONTRAST TECHNIQUE: Multidetector CT imaging of the abdomen and pelvis was performed following the standard protocol without IV contrast. Sagittal and coronal MPR images reconstructed from axial data set. Patient drank dilute oral contrast for exam. COMPARISON:  None; correlation PET-CT 02/21/2018 FINDINGS: Lower chest: Lobulated LEFT lower lobe mass with slightly spiculated margins 3.0 x 2.6 x 2.5 cm corresponding to FDG avid tumor on prior PET-CT. Small bibasilar pleural effusions and bibasilar atelectasis. Underlying emphysematous changes. Hepatobiliary: Calcified gallstones within a mildly distended gallbladder. Liver unremarkable. No biliary dilatation. Pancreas:  Pancreatic atrophy. Duodenal diverticulum at pancreatic head. Spleen: No focal mass.  Small calcified granuloma. Adrenals/Urinary Tract: Low-attenuation BILATERAL enlargement of the adrenal glands question adenomas versus marked hyperplasia, appearance stable versus prior PET-CT; these demonstrated no abnormal FDG accumulation. Upper pole RIGHT renal cyst unchanged. Kidneys, ureters and bladder otherwise unremarkable. Stomach/Bowel: Appendicular LEFT within the appendix. Scattered stool and tiny radiopacities in the colon. Stomach and bowel loops otherwise unremarkable. Vascular/Lymphatic: Extensive atherosclerotic calcifications aorta and iliac arteries. Low-attenuation of circulating blood question anemia. Enlargement of cardiac chambers. Mitral annular calcification. Few normal sized retroperitoneal and pelvic lymph nodes. No adenopathy. Reproductive: Prostate gland surgically absent Other: No free air or free fluid. No significant hernias. No acute inflammatory process. Musculoskeletal: No acute osseous findings. IMPRESSION: LEFT  lower lobe mass consistent with neoplasm. Bibasilar atelectasis and small pleural effusions. Question BILATERAL adrenal adenomas versus marked hyperplasia slightly greater on LEFT, demonstrating no abnormal FDG accumulation on PET-CT. Cholelithiasis. No definite intra-abdominal or intrapelvic metastases identified. Electronically Signed   By: Lavonia Dana M.D.   On: 03/19/2018 16:14   Dg Chest 2 View  Result Date: 03/15/2018 CLINICAL DATA:  Altered mental status EXAM: CHEST - 2 VIEW COMPARISON:  PET-CT dated 02/21/2018 FINDINGS: Retrocardiac/left lower lobe pulmonary nodule, corresponding to suspected primary bronchogenic neoplasm on PET-CT. No focal consolidation. Small right pleural effusion. No pneumothorax. The heart is normal in size. Degenerative changes of the visualized thoracolumbar spine. IMPRESSION: Retrocardiac/left lower lobe pulmonary nodule, corresponding to suspected primary bronchogenic neoplasm on PET-CT. Small right pleural effusion. Electronically Signed   By: Julian Hy M.D.   On: 03/15/2018 20:10   Dg Forearm Right  Result Date: 03/16/2018 CLINICAL DATA:  Skin tears to forearm. EXAM: RIGHT FOREARM - 2 VIEW COMPARISON:  None. FINDINGS: No fracture of the radius or ulna. The elbow joint and the wrist joint appear normal on two views. Laceration along the soft tissues of the forearm. IMPRESSION: No fracture or dislocation.  Soft tissue injury. Electronically Signed   By: Suzy Bouchard M.D.   On: 03/16/2018 21:44   Dg Tibia/fibula Right  Result Date: 03/04/2018 CLINICAL DATA:  Fall on Saturday. Right leg pain. Initial encounter. EXAM: RIGHT TIBIA AND FIBULA - 2 VIEW COMPARISON:  None. FINDINGS: Generalized subcutaneous reticulation that is nonspecific. No acute fracture or malalignment. No opaque foreign body. IMPRESSION: Generalized swelling without acute osseous finding. Electronically Signed   By: Monte Fantasia M.D.   On: 03/04/2018 11:56   Ct Head Wo Contrast  Result  Date: 03/16/2018 CLINICAL DATA:  Altered mental status.  Fall today. EXAM: CT HEAD WITHOUT CONTRAST TECHNIQUE: Contiguous axial images were obtained from the base of the skull through the vertex without intravenous contrast. COMPARISON:  CT scan of Mar 15, 2018. FINDINGS: Brain: Mild diffuse cortical atrophy is noted. Mild chronic ischemic white matter disease is noted. No mass effect or midline shift is noted. Ventricular size is within normal limits. There is no evidence of mass lesion, hemorrhage or acute infarction. Vascular: No hyperdense vessel or unexpected calcification. Skull: Normal. Negative for fracture or focal lesion. Sinuses/Orbits: Left sphenoid sinusitis is noted. Other: None. IMPRESSION: Mild diffuse cortical atrophy. Mild chronic ischemic white matter disease. No acute intracranial abnormality seen. Electronically Signed   By: Marijo Conception, M.D.   On: 03/16/2018 21:39   Ct Head Wo Contrast  Result Date: 03/15/2018 CLINICAL DATA:  Altered mental status, hallucinations, suicidal/homicidal ideation EXAM: CT HEAD WITHOUT CONTRAST TECHNIQUE: Contiguous axial images were obtained from the base of  the skull through the vertex without intravenous contrast. COMPARISON:  MRI brain dated 02/18/2018 FINDINGS: Brain: No evidence of acute infarction, hemorrhage, hydrocephalus, extra-axial collection or mass lesion/mass effect. Cortical atrophy. Subcortical white matter and periventricular small vessel ischemic changes. Vascular: No hyperdense vessel or unexpected calcification. Skull: Normal. Negative for fracture or focal lesion. Sinuses/Orbits: Partial opacification of the right maxillary sinus. Mastoid air cells are clear. Other: None. IMPRESSION: No evidence of acute intracranial abnormality. Atrophy with small vessel ischemic changes. Electronically Signed   By: Julian Hy M.D.   On: 03/15/2018 20:02   Dg Chest Port 1 View  Result Date: 03/19/2018 CLINICAL DATA:  77 year old male with  hypoxia. Subsequent encounter. EXAM: PORTABLE CHEST 1 VIEW COMPARISON:  03/15/2018 chest x-ray. 02/21/2018 PET-CT. 02/04/2018 chest CT. FINDINGS: Mild pulmonary vascular congestion. Mid right lung subsegmental atelectasis. Retrocardiac mass not as well delineated on present plain film exam. Heart size within normal limits. Aortic calcification. IMPRESSION: Mild pulmonary vascular congestion. Mid right lung subsegmental atelectasis. Retrocardiac mass not well delineated on present plain film exam. Aortic Atherosclerosis (ICD10-I70.0). Electronically Signed   By: Genia Del M.D.   On: 03/19/2018 18:14   Labs: Basic Metabolic Panel: Recent Labs  Lab 03/23/18 0452 03/24/18 0622 03/25/18 0512 03/27/18 0435  NA 143 140 141 139  K 4.3 4.1 4.2 3.8  CL 115* 112* 113* 112*  CO2 21* 21* 21* 24  GLUCOSE 79 90 111* 92  BUN 32* 33* 36* 35*  CREATININE 2.91* 3.03* 3.19* 2.97*  CALCIUM 8.2* 8.0* 8.2* 8.2*   CBC: Recent Labs  Lab 03/23/18 0452 03/24/18 0622 03/25/18 0512 03/27/18 0435  WBC 6.5 6.6 6.9 7.1  HGB 8.8* 8.4* 8.6* 8.7*  HCT 27.3* 25.6* 26.1* 27.1*  MCV 91.9 91.1 91.6 90.6  PLT 204 207 232 262    CBG: Recent Labs  Lab 03/26/18 0718 03/26/18 1109 03/26/18 1603 03/26/18 2108  GLUCAP 83 179* 140* 136*    Signed:  Barton Dubois MD.  [facility] 03/29/2018, 2:33 PM

## 2018-03-29 NOTE — Clinical Social Work Note (Signed)
[name] indicates that patient will be going [facility] and not to [facility].   LCSW notified Juliann Pulse at [facility] that the contact person for making contact with patient was step son, [name], who had requested that they contact him to schedule visits.     LCSW signing off.     Tametha Banning, Clydene Pugh, LCSW

## 2018-03-29 NOTE — Progress Notes (Signed)
Physical Therapy Treatment Patient Details Name: BETZALEL UMBARGER MRN: [id number] DOB: 05-23-1941 Today's Date: 03/29/2018    History of Present Illness Don [name] is a 77 y.o. male with history significant for atrial fibrillation, hypertension, hyperlipidemia, stage III chronic kidney disease, history of prostate cancer who initially presented to the [facility] emergency department on 5/4 with complaints of feeling depressed and suicidal with an actual suicide plan.  He was evaluated by TTS and it was decided that he needed inpatient psychiatric care.  He has been in the emergency department the past 2-1/2 days awaiting psychiatric placement.  Unfortunately it appears that he did not receive any fluids, his home medications were not entirely restarted and he has not been eating or drinking well since presentation.  When EDP today went to assess patient, patient was noticed to have a heart rate anywhere from 1 30-1 50, looked very pale.  Labs were drawn which showed a hemoglobin of 7.3, was 10.2 upon presentation on the fourth, he did perform a rectal exam, stool was brown in color and was only faintly positive for blood.  On blood work his creatinine is 3.32 which is around his baseline, he has stage IV chronic kidney disease, otherwise labs are within normal limits    PT Comments    Pt supine in bed and willing to participate.  Pt mod I with bed mobility and transfers, min cueing for handplacement for safety and assistance.  Gait training no AD x 159ft, my limited by fatigue following gait training.  Did require several short rest breaks standing and did reach for objects on the wall for support.  No LOB or reports of pain through session.  Planned on leaving pt in chair, pt requested to sit on EOB rather than chair,  Call bell within reach and RN aware.     Follow Up Recommendations        Equipment Recommendations       Recommendations for Other Services       Precautions / Restrictions  Precautions Precautions: None    Mobility  Bed Mobility Overal bed mobility: Modified Independent                Transfers Overall transfer level: Modified independent   Transfers: Sit to/from Stand Sit to Stand: Modified independent (Device/Increase time)            Ambulation/Gait Ambulation/Gait assistance: Modified independent (Device/Increase time)(no AD) Ambulation Distance (Feet): 160 Feet Assistive device: None Gait Pattern/deviations: Decreased step length - right;Decreased step length - left Gait velocity: slow   General Gait Details: PT did not need to lean on walls with therapist today, 3 short rest breaks standing, no LOB epsides   Stairs             Wheelchair Mobility    Modified Rankin (Stroke Patients Only)       Balance                                            Cognition Arousal/Alertness: Awake/alert Behavior During Therapy: WFL for tasks assessed/performed Overall Cognitive Status: Within Functional Limits for tasks assessed                                        Exercises  General Comments        Pertinent Vitals/Pain Pain Assessment: No/denies pain    Home Living                      Prior Function            PT Goals (current goals can now be found in the care plan section)      Frequency           PT Plan      Co-evaluation              AM-PAC PT "6 Clicks" Daily Activity  Outcome Measure                   End of Session               Time: 4765-4650 PT Time Calculation (min) (ACUTE ONLY): 28 min  Charges:  $Gait Training: 8-22 mins                    G Codes:       Ihor Austin, LPTA; CBIS [phone]   Aldona Lento 03/29/2018, 10:56 AM

## 2018-03-30 DIAGNOSIS — I5032 Chronic diastolic (congestive) heart failure: Secondary | ICD-10-CM | POA: Diagnosis not present

## 2018-03-30 DIAGNOSIS — N184 Chronic kidney disease, stage 4 (severe): Secondary | ICD-10-CM | POA: Diagnosis not present

## 2018-03-30 DIAGNOSIS — F329 Major depressive disorder, single episode, unspecified: Secondary | ICD-10-CM | POA: Diagnosis not present

## 2018-03-30 DIAGNOSIS — I480 Paroxysmal atrial fibrillation: Secondary | ICD-10-CM | POA: Diagnosis not present

## 2018-03-30 DIAGNOSIS — S51811D Laceration without foreign body of right forearm, subsequent encounter: Secondary | ICD-10-CM | POA: Diagnosis not present

## 2018-03-30 DIAGNOSIS — J449 Chronic obstructive pulmonary disease, unspecified: Secondary | ICD-10-CM | POA: Diagnosis not present

## 2018-03-30 DIAGNOSIS — K3189 Other diseases of stomach and duodenum: Secondary | ICD-10-CM | POA: Diagnosis not present

## 2018-03-30 DIAGNOSIS — D649 Anemia, unspecified: Secondary | ICD-10-CM | POA: Diagnosis not present

## 2018-03-30 DIAGNOSIS — I130 Hypertensive heart and chronic kidney disease with heart failure and stage 1 through stage 4 chronic kidney disease, or unspecified chronic kidney disease: Secondary | ICD-10-CM | POA: Diagnosis not present

## 2018-04-01 DIAGNOSIS — I5032 Chronic diastolic (congestive) heart failure: Secondary | ICD-10-CM | POA: Diagnosis not present

## 2018-04-01 DIAGNOSIS — J449 Chronic obstructive pulmonary disease, unspecified: Secondary | ICD-10-CM | POA: Diagnosis not present

## 2018-04-01 DIAGNOSIS — F329 Major depressive disorder, single episode, unspecified: Secondary | ICD-10-CM | POA: Diagnosis not present

## 2018-04-01 DIAGNOSIS — K3189 Other diseases of stomach and duodenum: Secondary | ICD-10-CM | POA: Diagnosis not present

## 2018-04-01 DIAGNOSIS — D649 Anemia, unspecified: Secondary | ICD-10-CM | POA: Diagnosis not present

## 2018-04-01 DIAGNOSIS — S51811D Laceration without foreign body of right forearm, subsequent encounter: Secondary | ICD-10-CM | POA: Diagnosis not present

## 2018-04-01 DIAGNOSIS — I480 Paroxysmal atrial fibrillation: Secondary | ICD-10-CM | POA: Diagnosis not present

## 2018-04-01 DIAGNOSIS — N184 Chronic kidney disease, stage 4 (severe): Secondary | ICD-10-CM | POA: Diagnosis not present

## 2018-04-01 DIAGNOSIS — I130 Hypertensive heart and chronic kidney disease with heart failure and stage 1 through stage 4 chronic kidney disease, or unspecified chronic kidney disease: Secondary | ICD-10-CM | POA: Diagnosis not present

## 2018-04-02 DIAGNOSIS — D649 Anemia, unspecified: Secondary | ICD-10-CM | POA: Diagnosis not present

## 2018-04-02 DIAGNOSIS — K3189 Other diseases of stomach and duodenum: Secondary | ICD-10-CM | POA: Diagnosis not present

## 2018-04-02 DIAGNOSIS — I480 Paroxysmal atrial fibrillation: Secondary | ICD-10-CM | POA: Diagnosis not present

## 2018-04-02 DIAGNOSIS — S51811D Laceration without foreign body of right forearm, subsequent encounter: Secondary | ICD-10-CM | POA: Diagnosis not present

## 2018-04-02 DIAGNOSIS — I5032 Chronic diastolic (congestive) heart failure: Secondary | ICD-10-CM | POA: Diagnosis not present

## 2018-04-02 DIAGNOSIS — J449 Chronic obstructive pulmonary disease, unspecified: Secondary | ICD-10-CM | POA: Diagnosis not present

## 2018-04-02 DIAGNOSIS — N184 Chronic kidney disease, stage 4 (severe): Secondary | ICD-10-CM | POA: Diagnosis not present

## 2018-04-02 DIAGNOSIS — F329 Major depressive disorder, single episode, unspecified: Secondary | ICD-10-CM | POA: Diagnosis not present

## 2018-04-02 DIAGNOSIS — I130 Hypertensive heart and chronic kidney disease with heart failure and stage 1 through stage 4 chronic kidney disease, or unspecified chronic kidney disease: Secondary | ICD-10-CM | POA: Diagnosis not present

## 2018-04-03 DIAGNOSIS — I5032 Chronic diastolic (congestive) heart failure: Secondary | ICD-10-CM | POA: Diagnosis not present

## 2018-04-03 DIAGNOSIS — K3189 Other diseases of stomach and duodenum: Secondary | ICD-10-CM | POA: Diagnosis not present

## 2018-04-03 DIAGNOSIS — I480 Paroxysmal atrial fibrillation: Secondary | ICD-10-CM | POA: Diagnosis not present

## 2018-04-03 DIAGNOSIS — J449 Chronic obstructive pulmonary disease, unspecified: Secondary | ICD-10-CM | POA: Diagnosis not present

## 2018-04-03 DIAGNOSIS — F329 Major depressive disorder, single episode, unspecified: Secondary | ICD-10-CM | POA: Diagnosis not present

## 2018-04-03 DIAGNOSIS — N184 Chronic kidney disease, stage 4 (severe): Secondary | ICD-10-CM | POA: Diagnosis not present

## 2018-04-03 DIAGNOSIS — I130 Hypertensive heart and chronic kidney disease with heart failure and stage 1 through stage 4 chronic kidney disease, or unspecified chronic kidney disease: Secondary | ICD-10-CM | POA: Diagnosis not present

## 2018-04-03 DIAGNOSIS — D649 Anemia, unspecified: Secondary | ICD-10-CM | POA: Diagnosis not present

## 2018-04-03 DIAGNOSIS — S51811D Laceration without foreign body of right forearm, subsequent encounter: Secondary | ICD-10-CM | POA: Diagnosis not present

## 2018-04-04 ENCOUNTER — Ambulatory Visit (INDEPENDENT_AMBULATORY_CARE_PROVIDER_SITE_OTHER): Payer: Medicare HMO | Admitting: Family Medicine

## 2018-04-04 ENCOUNTER — Encounter: Payer: Self-pay | Admitting: Family Medicine

## 2018-04-04 VITALS — BP 134/74 | HR 87 | Temp 97.4°F | Ht 69.0 in | Wt 154.0 lb

## 2018-04-04 DIAGNOSIS — R531 Weakness: Secondary | ICD-10-CM

## 2018-04-04 DIAGNOSIS — I481 Persistent atrial fibrillation: Secondary | ICD-10-CM

## 2018-04-04 DIAGNOSIS — K3189 Other diseases of stomach and duodenum: Secondary | ICD-10-CM | POA: Diagnosis not present

## 2018-04-04 DIAGNOSIS — R296 Repeated falls: Secondary | ICD-10-CM | POA: Diagnosis not present

## 2018-04-04 DIAGNOSIS — F329 Major depressive disorder, single episode, unspecified: Secondary | ICD-10-CM | POA: Diagnosis not present

## 2018-04-04 DIAGNOSIS — I4819 Other persistent atrial fibrillation: Secondary | ICD-10-CM

## 2018-04-04 DIAGNOSIS — D649 Anemia, unspecified: Secondary | ICD-10-CM | POA: Diagnosis not present

## 2018-04-04 DIAGNOSIS — J449 Chronic obstructive pulmonary disease, unspecified: Secondary | ICD-10-CM | POA: Diagnosis not present

## 2018-04-04 DIAGNOSIS — I130 Hypertensive heart and chronic kidney disease with heart failure and stage 1 through stage 4 chronic kidney disease, or unspecified chronic kidney disease: Secondary | ICD-10-CM | POA: Diagnosis not present

## 2018-04-04 DIAGNOSIS — S51811D Laceration without foreign body of right forearm, subsequent encounter: Secondary | ICD-10-CM

## 2018-04-04 DIAGNOSIS — I480 Paroxysmal atrial fibrillation: Secondary | ICD-10-CM | POA: Diagnosis not present

## 2018-04-04 DIAGNOSIS — N184 Chronic kidney disease, stage 4 (severe): Secondary | ICD-10-CM | POA: Diagnosis not present

## 2018-04-04 DIAGNOSIS — I5032 Chronic diastolic (congestive) heart failure: Secondary | ICD-10-CM | POA: Diagnosis not present

## 2018-04-04 NOTE — Progress Notes (Signed)
BP 134/74   Pulse 87   Temp (!) 97.4 F (36.3 C) (Oral)   Ht 5\' 9"  (1.753 m)   Wt 154 lb (69.9 kg)   BMI 22.74 kg/m    Subjective:    Patient ID: Don [name], male    DOB: November 06, 1941, 77 y.o.   MRN: [id number]  HPI: Don [name] is a 77 y.o. male presenting on 04/04/2018 for No chief complaint on file.   HPI Hospital follow-up Patient is coming in today for hospital follow-up.  He was initially admitted to the hospital on 03/16/2018 and was discharged on 03/29/2018.  They said that he had been acting confused and 1 of the neighbors called EMS because they found him outside in a ditch and thought he had fallen though he is taken to the hospital.  They said that there was some talk of him having hallucinations or seeing things that were happening versus memory and confabulation.  They said that he was going to be transferred on 03/16/2018 to a mental health facility but then that did not happen because of a laceration on his right forearm.  They do not know when the laceration was sustained but it is a very large laceration that is open and they then did wound healing for it.  They said he was initially feeling depressed and suicidal at the time.  He was admitted to the main hospital floor on 03/18/2018.  When in the emergency department he was found to have a heart rate between 130 and 150 and had not been receiving his home medications and he had had a dip of his hemoglobin down to 7.3.  He was found to be in A. fib with RVR and given fluids and admitted to the floor.  He also sustained a fall while in the emergency department that was witnessed and sustained a large skin tear to the right forearm.  This occurred on 03/16/2018.  Patient has been having recurrent falls and generalized weakness and deconditioning and especially concerned about his balance and core strength that has been problematic.  Relevant past medical, surgical, family and social history reviewed and updated as indicated. Interim  medical history since our last visit reviewed. Allergies and medications reviewed and updated.  Review of Systems  Constitutional: Negative for chills and fever.  Eyes: Negative for discharge.  Respiratory: Negative for shortness of breath and wheezing.   Cardiovascular: Negative for chest pain and leg swelling.  Musculoskeletal: Negative for back pain and gait problem.  Skin: Positive for wound. Negative for rash.  Neurological: Positive for weakness. Negative for dizziness, light-headedness and numbness.  All other systems reviewed and are negative.   Per HPI unless specifically indicated above   Allergies as of 04/04/2018      Reactions   Wellbutrin [bupropion] Anxiety      Medication List        Accurate as of 04/04/18 11:51 AM. Always use your most recent med list.          apixaban 5 MG Tabs tablet Commonly known as:  ELIQUIS Take 1 tablet (5 mg total) by mouth 2 (two) times daily.   atorvastatin 20 MG tablet Commonly known as:  LIPITOR TAKE 1 TABLET DAILY   bisoprolol 10 MG tablet Commonly known as:  ZEBETA Take 1 tablet (10 mg total) by mouth daily.   calcitRIOL 0.25 MCG capsule Commonly known as:  ROCALTROL Take 1 capsule (0.25 mcg total) by mouth daily.   diltiazem 240  MG 24 hr capsule Commonly known as:  CARDIZEM CD Take 1 capsule (240 mg total) by mouth daily.   iron polysaccharides 150 MG capsule Commonly known as:  NIFEREX Take 150 mg by mouth daily.   loratadine 10 MG tablet Commonly known as:  CLARITIN Take 10 mg by mouth daily.   pantoprazole 40 MG tablet Commonly known as:  PROTONIX Take 40 mg by mouth 2 (two) times daily.   terazosin 2 MG capsule Commonly known as:  HYTRIN Take 2 mg by mouth at bedtime.          Objective:    BP 134/74   Pulse 87   Temp (!) 97.4 F (36.3 C) (Oral)   Ht 5\' 9"  (1.753 m)   Wt 154 lb (69.9 kg)   BMI 22.74 kg/m   Wt Readings from Last 3 Encounters:  04/04/18 154 lb (69.9 kg)  03/28/18 146  lb 11.2 oz (66.5 kg)  03/15/18 162 lb (73.5 kg)    Physical Exam  Constitutional: He is oriented to person, place, and time. He appears well-developed and well-nourished. No distress.  Eyes: Conjunctivae are normal. No scleral icterus.  Neck: Neck supple. No thyromegaly present.  Cardiovascular: Normal rate, normal heart sounds and intact distal pulses. An irregularly irregular rhythm present.  No murmur heard. Pulmonary/Chest: Effort normal and breath sounds normal. No respiratory distress. He has no wheezes.  Musculoskeletal: Normal range of motion. He exhibits no edema.  Difficulty with ambulation due to decreased strength  Lymphadenopathy:    He has no cervical adenopathy.  Neurological: He is alert and oriented to person, place, and time. He exhibits abnormal muscle tone (4 out of 5 strength in all extremities). Coordination normal.  Skin: Skin is warm and dry. Laceration noted. No rash noted. He is not diaphoretic.     Psychiatric: He has a normal mood and affect. His behavior is normal.  Nursing note and vitals reviewed.   Wound care: Debrided some dead tissue off of the center of the wound with forceps and simple sutures, minimal bleeding.  Patient tolerated well.  Placed Xeroform gauze and then 3 x 3 gauze and wrapped with Ace bandage    Assessment & Plan:   Problem List Items Addressed This Visit    None    Visit Diagnoses    Recurrent falls    -  Primary   Laceration of right forearm, subsequent encounter       Has home health wound care right now   Persistent atrial fibrillation ([place])       Patient was in A. fib with RVR at the hospital   Generalized weakness          Will try for both inpatient rehab at a rehab or extended care facility and inpatient wound care for better management.  We are doing his FL 2, if this does not work then we will try to do home health physical therapy and try to increase his wound care coverage.  Follow up plan: Return in about 2  weeks (around 04/18/2018), or if symptoms worsen or fail to improve, for Wound recheck if cannot get into wound care.  Counseling provided for all of the vaccine components No orders of the defined types were placed in this encounter.   Caryl Pina, MD [facility] 04/04/2018, 11:51 AM

## 2018-04-08 DIAGNOSIS — I480 Paroxysmal atrial fibrillation: Secondary | ICD-10-CM | POA: Diagnosis not present

## 2018-04-08 DIAGNOSIS — F329 Major depressive disorder, single episode, unspecified: Secondary | ICD-10-CM | POA: Diagnosis not present

## 2018-04-08 DIAGNOSIS — I130 Hypertensive heart and chronic kidney disease with heart failure and stage 1 through stage 4 chronic kidney disease, or unspecified chronic kidney disease: Secondary | ICD-10-CM | POA: Diagnosis not present

## 2018-04-08 DIAGNOSIS — J449 Chronic obstructive pulmonary disease, unspecified: Secondary | ICD-10-CM | POA: Diagnosis not present

## 2018-04-08 DIAGNOSIS — N184 Chronic kidney disease, stage 4 (severe): Secondary | ICD-10-CM | POA: Diagnosis not present

## 2018-04-08 DIAGNOSIS — S51811D Laceration without foreign body of right forearm, subsequent encounter: Secondary | ICD-10-CM | POA: Diagnosis not present

## 2018-04-08 DIAGNOSIS — K3189 Other diseases of stomach and duodenum: Secondary | ICD-10-CM | POA: Diagnosis not present

## 2018-04-08 DIAGNOSIS — D649 Anemia, unspecified: Secondary | ICD-10-CM | POA: Diagnosis not present

## 2018-04-08 DIAGNOSIS — I5032 Chronic diastolic (congestive) heart failure: Secondary | ICD-10-CM | POA: Diagnosis not present

## 2018-04-09 ENCOUNTER — Telehealth: Payer: Self-pay | Admitting: Family Medicine

## 2018-04-09 DIAGNOSIS — N184 Chronic kidney disease, stage 4 (severe): Secondary | ICD-10-CM | POA: Diagnosis not present

## 2018-04-09 DIAGNOSIS — I130 Hypertensive heart and chronic kidney disease with heart failure and stage 1 through stage 4 chronic kidney disease, or unspecified chronic kidney disease: Secondary | ICD-10-CM | POA: Diagnosis not present

## 2018-04-09 DIAGNOSIS — S51811D Laceration without foreign body of right forearm, subsequent encounter: Secondary | ICD-10-CM | POA: Diagnosis not present

## 2018-04-09 DIAGNOSIS — D649 Anemia, unspecified: Secondary | ICD-10-CM | POA: Diagnosis not present

## 2018-04-09 DIAGNOSIS — F329 Major depressive disorder, single episode, unspecified: Secondary | ICD-10-CM | POA: Diagnosis not present

## 2018-04-09 DIAGNOSIS — J449 Chronic obstructive pulmonary disease, unspecified: Secondary | ICD-10-CM | POA: Diagnosis not present

## 2018-04-09 DIAGNOSIS — I5032 Chronic diastolic (congestive) heart failure: Secondary | ICD-10-CM | POA: Diagnosis not present

## 2018-04-09 DIAGNOSIS — I480 Paroxysmal atrial fibrillation: Secondary | ICD-10-CM | POA: Diagnosis not present

## 2018-04-09 DIAGNOSIS — K3189 Other diseases of stomach and duodenum: Secondary | ICD-10-CM | POA: Diagnosis not present

## 2018-04-09 NOTE — Telephone Encounter (Signed)
Don [name] / Don [name] - can you check into this?

## 2018-04-09 NOTE — Telephone Encounter (Signed)
There is no referral for wound care

## 2018-04-10 NOTE — Telephone Encounter (Signed)
FL2 was faxed on 04/04/18 Called [facility] FL2 was not received Was given another fax number & refaxed Family member aware

## 2018-04-11 ENCOUNTER — Ambulatory Visit (INDEPENDENT_AMBULATORY_CARE_PROVIDER_SITE_OTHER): Payer: Medicare HMO | Admitting: Family Medicine

## 2018-04-11 ENCOUNTER — Encounter: Payer: Self-pay | Admitting: Family Medicine

## 2018-04-11 VITALS — BP 139/81 | HR 88 | Temp 97.2°F | Ht 69.0 in | Wt 159.0 lb

## 2018-04-11 DIAGNOSIS — S51811D Laceration without foreign body of right forearm, subsequent encounter: Secondary | ICD-10-CM

## 2018-04-11 DIAGNOSIS — J449 Chronic obstructive pulmonary disease, unspecified: Secondary | ICD-10-CM | POA: Diagnosis not present

## 2018-04-11 DIAGNOSIS — K3189 Other diseases of stomach and duodenum: Secondary | ICD-10-CM | POA: Diagnosis not present

## 2018-04-11 DIAGNOSIS — I130 Hypertensive heart and chronic kidney disease with heart failure and stage 1 through stage 4 chronic kidney disease, or unspecified chronic kidney disease: Secondary | ICD-10-CM | POA: Diagnosis not present

## 2018-04-11 DIAGNOSIS — I5032 Chronic diastolic (congestive) heart failure: Secondary | ICD-10-CM | POA: Diagnosis not present

## 2018-04-11 DIAGNOSIS — N184 Chronic kidney disease, stage 4 (severe): Secondary | ICD-10-CM | POA: Diagnosis not present

## 2018-04-11 DIAGNOSIS — F329 Major depressive disorder, single episode, unspecified: Secondary | ICD-10-CM | POA: Diagnosis not present

## 2018-04-11 DIAGNOSIS — D649 Anemia, unspecified: Secondary | ICD-10-CM | POA: Diagnosis not present

## 2018-04-11 DIAGNOSIS — I480 Paroxysmal atrial fibrillation: Secondary | ICD-10-CM | POA: Diagnosis not present

## 2018-04-11 NOTE — Progress Notes (Signed)
   BP 139/81   Pulse 88   Temp (!) 97.2 F (36.2 C) (Oral)   Ht 5\' 9"  (1.753 m)   Wt 159 lb (72.1 kg)   BMI 23.48 kg/m    Subjective:    Patient ID: Don [name], male    DOB: 26-Apr-1941, 77 y.o.   MRN: [id number]  HPI: Don [name] is a 77 y.o. male presenting on 04/11/2018 for Follow up wound care   HPI Wound care follow-up Patient is coming in today for wound care follow-up, they have continued to do wet-to-dry dressings and it does appear that the wound is healing nicely and is about 40% healing.  There is no purulent drainage or erythema.  Patient says that he is having pruritus with it which is a good sign because it is also healing.  They are having home wound care come by twice a week but we are trying to do placement for an inpatient rehab facility to get him into physical therapy.  Relevant past medical, surgical, family and social history reviewed and updated as indicated. Interim medical history since our last visit reviewed. Allergies and medications reviewed and updated.  Review of Systems  Constitutional: Negative for chills and fever.  Respiratory: Negative for shortness of breath and wheezing.   Cardiovascular: Negative for chest pain and leg swelling.  Musculoskeletal: Positive for arthralgias and joint swelling (Patient still has olecranon bursitis of left elbow, no signs of erythema or warmth). Negative for back pain and gait problem.  Skin: Positive for wound. Negative for rash.  All other systems reviewed and are negative.   Per HPI unless specifically indicated above        Objective:    BP 139/81   Pulse 88   Temp (!) 97.2 F (36.2 C) (Oral)   Ht 5\' 9"  (1.753 m)   Wt 159 lb (72.1 kg)   BMI 23.48 kg/m   Wt Readings from Last 3 Encounters:  04/11/18 159 lb (72.1 kg)  04/04/18 154 lb (69.9 kg)  03/28/18 146 lb 11.2 oz (66.5 kg)    Physical Exam  Constitutional: He appears well-developed and well-nourished. No distress.  Eyes: Conjunctivae  are normal. No scleral icterus.  Neurological: He is alert. Coordination normal.  Skin: Skin is warm and dry. Laceration (Wound is about 40% healed and has proud granular pink tissue base, continue wet-to-dry dressings) noted. No rash noted. He is not diaphoretic.  Psychiatric: He has a normal mood and affect. His behavior is normal.  Nursing note and vitals reviewed.       Assessment & Plan:   Problem List Items Addressed This Visit    None    Visit Diagnoses    Laceration of right forearm, subsequent encounter    -  Primary   About 40% healed, continue wet-to-dry dressing       Follow up plan: Return in about 1 week (around 04/18/2018), or if symptoms worsen or fail to improve, for Wound recheck.  Counseling provided for all of the vaccine components No orders of the defined types were placed in this encounter.   Caryl Pina, MD [facility] 04/11/2018, 11:15 AM

## 2018-04-15 ENCOUNTER — Ambulatory Visit (INDEPENDENT_AMBULATORY_CARE_PROVIDER_SITE_OTHER): Payer: Medicare HMO

## 2018-04-15 DIAGNOSIS — F17210 Nicotine dependence, cigarettes, uncomplicated: Secondary | ICD-10-CM

## 2018-04-15 DIAGNOSIS — N184 Chronic kidney disease, stage 4 (severe): Secondary | ICD-10-CM | POA: Diagnosis not present

## 2018-04-15 DIAGNOSIS — D649 Anemia, unspecified: Secondary | ICD-10-CM

## 2018-04-15 DIAGNOSIS — I480 Paroxysmal atrial fibrillation: Secondary | ICD-10-CM

## 2018-04-15 DIAGNOSIS — I5032 Chronic diastolic (congestive) heart failure: Secondary | ICD-10-CM

## 2018-04-15 DIAGNOSIS — H3530 Unspecified macular degeneration: Secondary | ICD-10-CM | POA: Diagnosis not present

## 2018-04-15 DIAGNOSIS — K3189 Other diseases of stomach and duodenum: Secondary | ICD-10-CM

## 2018-04-15 DIAGNOSIS — J449 Chronic obstructive pulmonary disease, unspecified: Secondary | ICD-10-CM | POA: Diagnosis not present

## 2018-04-15 DIAGNOSIS — I130 Hypertensive heart and chronic kidney disease with heart failure and stage 1 through stage 4 chronic kidney disease, or unspecified chronic kidney disease: Secondary | ICD-10-CM | POA: Diagnosis not present

## 2018-04-15 DIAGNOSIS — F329 Major depressive disorder, single episode, unspecified: Secondary | ICD-10-CM

## 2018-04-15 DIAGNOSIS — F17290 Nicotine dependence, other tobacco product, uncomplicated: Secondary | ICD-10-CM

## 2018-04-15 DIAGNOSIS — S51811D Laceration without foreign body of right forearm, subsequent encounter: Secondary | ICD-10-CM | POA: Diagnosis not present

## 2018-04-16 ENCOUNTER — Ambulatory Visit: Payer: Medicare HMO | Admitting: Nurse Practitioner

## 2018-04-16 DIAGNOSIS — J449 Chronic obstructive pulmonary disease, unspecified: Secondary | ICD-10-CM | POA: Diagnosis not present

## 2018-04-16 DIAGNOSIS — N184 Chronic kidney disease, stage 4 (severe): Secondary | ICD-10-CM | POA: Diagnosis not present

## 2018-04-16 DIAGNOSIS — I5032 Chronic diastolic (congestive) heart failure: Secondary | ICD-10-CM | POA: Diagnosis not present

## 2018-04-16 DIAGNOSIS — I130 Hypertensive heart and chronic kidney disease with heart failure and stage 1 through stage 4 chronic kidney disease, or unspecified chronic kidney disease: Secondary | ICD-10-CM | POA: Diagnosis not present

## 2018-04-16 DIAGNOSIS — S51811D Laceration without foreign body of right forearm, subsequent encounter: Secondary | ICD-10-CM | POA: Diagnosis not present

## 2018-04-16 DIAGNOSIS — D649 Anemia, unspecified: Secondary | ICD-10-CM | POA: Diagnosis not present

## 2018-04-16 DIAGNOSIS — K3189 Other diseases of stomach and duodenum: Secondary | ICD-10-CM | POA: Diagnosis not present

## 2018-04-16 DIAGNOSIS — I480 Paroxysmal atrial fibrillation: Secondary | ICD-10-CM | POA: Diagnosis not present

## 2018-04-16 DIAGNOSIS — F329 Major depressive disorder, single episode, unspecified: Secondary | ICD-10-CM | POA: Diagnosis not present

## 2018-04-17 DIAGNOSIS — F329 Major depressive disorder, single episode, unspecified: Secondary | ICD-10-CM | POA: Diagnosis not present

## 2018-04-17 DIAGNOSIS — S51811D Laceration without foreign body of right forearm, subsequent encounter: Secondary | ICD-10-CM | POA: Diagnosis not present

## 2018-04-17 DIAGNOSIS — I5032 Chronic diastolic (congestive) heart failure: Secondary | ICD-10-CM | POA: Diagnosis not present

## 2018-04-17 DIAGNOSIS — D649 Anemia, unspecified: Secondary | ICD-10-CM | POA: Diagnosis not present

## 2018-04-17 DIAGNOSIS — K3189 Other diseases of stomach and duodenum: Secondary | ICD-10-CM | POA: Diagnosis not present

## 2018-04-17 DIAGNOSIS — N184 Chronic kidney disease, stage 4 (severe): Secondary | ICD-10-CM | POA: Diagnosis not present

## 2018-04-17 DIAGNOSIS — I130 Hypertensive heart and chronic kidney disease with heart failure and stage 1 through stage 4 chronic kidney disease, or unspecified chronic kidney disease: Secondary | ICD-10-CM | POA: Diagnosis not present

## 2018-04-17 DIAGNOSIS — J449 Chronic obstructive pulmonary disease, unspecified: Secondary | ICD-10-CM | POA: Diagnosis not present

## 2018-04-17 DIAGNOSIS — I480 Paroxysmal atrial fibrillation: Secondary | ICD-10-CM | POA: Diagnosis not present

## 2018-04-18 ENCOUNTER — Encounter: Payer: Self-pay | Admitting: Family Medicine

## 2018-04-18 ENCOUNTER — Ambulatory Visit (INDEPENDENT_AMBULATORY_CARE_PROVIDER_SITE_OTHER): Payer: Medicare HMO | Admitting: Family Medicine

## 2018-04-18 VITALS — BP 137/89 | HR 76 | Temp 97.5°F | Ht 69.0 in | Wt 165.0 lb

## 2018-04-18 DIAGNOSIS — S51811D Laceration without foreign body of right forearm, subsequent encounter: Secondary | ICD-10-CM

## 2018-04-18 NOTE — Progress Notes (Signed)
BP 137/89   Pulse 76   Temp (!) 97.5 F (36.4 C) (Oral)   Ht 5\' 9"  (1.753 m)   Wt 165 lb (74.8 kg)   BMI 24.37 kg/m    Subjective:    Patient ID: Don [name], male    DOB: 12/19/1940, 77 y.o.   MRN: [id number]  HPI: Don [name] is a 77 y.o. male presenting on 04/18/2018 for Wound Check   HPI Wound care recheck Patient is coming in for his wound recheck.  The wound is on his right forearm and started as a very large area that was a skin tear that he sustained during the hospital visit.  Have been followed up with them every couple weeks numbness and they have been doing topical Xeroform and covered it with gauze and then wrapped it with an Ace bandage.  The wound is healing in by secondary intention from the top and he denies any redness or warmth or drainage or pain with it.  Relevant past medical, surgical, family and social history reviewed and updated as indicated. Interim medical history since our last visit reviewed. Allergies and medications reviewed and updated.  Review of Systems  Constitutional: Negative for chills and fever.  Respiratory: Negative for shortness of breath and wheezing.   Cardiovascular: Negative for chest pain and leg swelling.  Musculoskeletal: Negative for back pain and gait problem.  Skin: Positive for wound. Negative for color change and rash.  All other systems reviewed and are negative.   Per HPI unless specifically indicated above   Allergies as of 04/18/2018      Reactions   Wellbutrin [bupropion] Anxiety      Medication List        Accurate as of 04/18/18 11:39 AM. Always use your most recent med list.          acetaminophen 500 MG tablet Commonly known as:  TYLENOL Take 500 mg by mouth every 6 (six) hours as needed.   apixaban 5 MG Tabs tablet Commonly known as:  ELIQUIS Take 1 tablet (5 mg total) by mouth 2 (two) times daily.   atorvastatin 20 MG tablet Commonly known as:  LIPITOR TAKE 1 TABLET DAILY   bisoprolol 10  MG tablet Commonly known as:  ZEBETA Take 1 tablet (10 mg total) by mouth daily.   calcitRIOL 0.25 MCG capsule Commonly known as:  ROCALTROL Take 1 capsule (0.25 mcg total) by mouth daily.   diltiazem 240 MG 24 hr capsule Commonly known as:  CARDIZEM CD Take 1 capsule (240 mg total) by mouth daily.   iron polysaccharides 150 MG capsule Commonly known as:  NIFEREX Take 150 mg by mouth daily.   loratadine 10 MG tablet Commonly known as:  CLARITIN Take 10 mg by mouth daily.   Melatonin 5 MG Tabs Take 5 mg by mouth at bedtime.   nystatin 100000 UNITS/ML Susp Commonly known as:  MYCOSTATIN Take by mouth every 6 (six) hours.   pantoprazole 40 MG tablet Commonly known as:  PROTONIX Take 40 mg by mouth 2 (two) times daily.   terazosin 2 MG capsule Commonly known as:  HYTRIN Take 2 mg by mouth at bedtime.          Objective:    BP 137/89   Pulse 76   Temp (!) 97.5 F (36.4 C) (Oral)   Ht 5\' 9"  (1.753 m)   Wt 165 lb (74.8 kg)   BMI 24.37 kg/m   Wt Readings from Last 3  Encounters:  04/18/18 165 lb (74.8 kg)  04/11/18 159 lb (72.1 kg)  04/04/18 154 lb (69.9 kg)    Physical Exam  Constitutional: He is oriented to person, place, and time. He appears well-developed and well-nourished. No distress.  Eyes: Conjunctivae are normal. No scleral icterus.  Neurological: He is alert and oriented to person, place, and time. Coordination normal.  Skin: Skin is warm and dry. Laceration (Wound is healing in from the proximal aspect and appears to be healed again by about 20% on that margin.  He still has very proud base of granular tissue but no signs of erythema or purulent drainage or infection.) noted. No rash noted. He is not diaphoretic.  Psychiatric: He has a normal mood and affect. His behavior is normal.  Nursing note and vitals reviewed.       Assessment & Plan:   Problem List Items Addressed This Visit    None    Visit Diagnoses    Laceration of right forearm,  subsequent encounter    -  Primary   Healed about 20% in by secondary intention, continue wet-to-dry      Replaced Xeroform gauze and 3 x 3 and wrapped with Ace bandage.  Patient is to continue daily wound changes in concordance with this. Follow up plan: Return in about 2 weeks (around 05/02/2018), or if symptoms worsen or fail to improve, for Wound recheck.  Counseling provided for all of the vaccine components No orders of the defined types were placed in this encounter.   Caryl Pina, MD [facility] 04/18/2018, 11:39 AM

## 2018-04-19 DIAGNOSIS — I130 Hypertensive heart and chronic kidney disease with heart failure and stage 1 through stage 4 chronic kidney disease, or unspecified chronic kidney disease: Secondary | ICD-10-CM | POA: Diagnosis not present

## 2018-04-19 DIAGNOSIS — I5032 Chronic diastolic (congestive) heart failure: Secondary | ICD-10-CM | POA: Diagnosis not present

## 2018-04-19 DIAGNOSIS — N184 Chronic kidney disease, stage 4 (severe): Secondary | ICD-10-CM | POA: Diagnosis not present

## 2018-04-19 DIAGNOSIS — S51811D Laceration without foreign body of right forearm, subsequent encounter: Secondary | ICD-10-CM | POA: Diagnosis not present

## 2018-04-19 DIAGNOSIS — J449 Chronic obstructive pulmonary disease, unspecified: Secondary | ICD-10-CM | POA: Diagnosis not present

## 2018-04-19 DIAGNOSIS — F329 Major depressive disorder, single episode, unspecified: Secondary | ICD-10-CM | POA: Diagnosis not present

## 2018-04-19 DIAGNOSIS — K3189 Other diseases of stomach and duodenum: Secondary | ICD-10-CM | POA: Diagnosis not present

## 2018-04-19 DIAGNOSIS — D649 Anemia, unspecified: Secondary | ICD-10-CM | POA: Diagnosis not present

## 2018-04-19 DIAGNOSIS — I480 Paroxysmal atrial fibrillation: Secondary | ICD-10-CM | POA: Diagnosis not present

## 2018-04-22 DIAGNOSIS — F329 Major depressive disorder, single episode, unspecified: Secondary | ICD-10-CM | POA: Diagnosis not present

## 2018-04-22 DIAGNOSIS — K3189 Other diseases of stomach and duodenum: Secondary | ICD-10-CM | POA: Diagnosis not present

## 2018-04-22 DIAGNOSIS — I480 Paroxysmal atrial fibrillation: Secondary | ICD-10-CM | POA: Diagnosis not present

## 2018-04-22 DIAGNOSIS — I5032 Chronic diastolic (congestive) heart failure: Secondary | ICD-10-CM | POA: Diagnosis not present

## 2018-04-22 DIAGNOSIS — I130 Hypertensive heart and chronic kidney disease with heart failure and stage 1 through stage 4 chronic kidney disease, or unspecified chronic kidney disease: Secondary | ICD-10-CM | POA: Diagnosis not present

## 2018-04-22 DIAGNOSIS — J449 Chronic obstructive pulmonary disease, unspecified: Secondary | ICD-10-CM | POA: Diagnosis not present

## 2018-04-22 DIAGNOSIS — D649 Anemia, unspecified: Secondary | ICD-10-CM | POA: Diagnosis not present

## 2018-04-22 DIAGNOSIS — N184 Chronic kidney disease, stage 4 (severe): Secondary | ICD-10-CM | POA: Diagnosis not present

## 2018-04-22 DIAGNOSIS — S51811D Laceration without foreign body of right forearm, subsequent encounter: Secondary | ICD-10-CM | POA: Diagnosis not present

## 2018-04-23 ENCOUNTER — Other Ambulatory Visit: Payer: Self-pay | Admitting: Family Medicine

## 2018-04-25 DIAGNOSIS — S51811D Laceration without foreign body of right forearm, subsequent encounter: Secondary | ICD-10-CM | POA: Diagnosis not present

## 2018-04-25 DIAGNOSIS — N184 Chronic kidney disease, stage 4 (severe): Secondary | ICD-10-CM | POA: Diagnosis not present

## 2018-04-25 DIAGNOSIS — K3189 Other diseases of stomach and duodenum: Secondary | ICD-10-CM | POA: Diagnosis not present

## 2018-04-25 DIAGNOSIS — I480 Paroxysmal atrial fibrillation: Secondary | ICD-10-CM | POA: Diagnosis not present

## 2018-04-25 DIAGNOSIS — D649 Anemia, unspecified: Secondary | ICD-10-CM | POA: Diagnosis not present

## 2018-04-25 DIAGNOSIS — F329 Major depressive disorder, single episode, unspecified: Secondary | ICD-10-CM | POA: Diagnosis not present

## 2018-04-25 DIAGNOSIS — I5032 Chronic diastolic (congestive) heart failure: Secondary | ICD-10-CM | POA: Diagnosis not present

## 2018-04-25 DIAGNOSIS — J449 Chronic obstructive pulmonary disease, unspecified: Secondary | ICD-10-CM | POA: Diagnosis not present

## 2018-04-25 DIAGNOSIS — I130 Hypertensive heart and chronic kidney disease with heart failure and stage 1 through stage 4 chronic kidney disease, or unspecified chronic kidney disease: Secondary | ICD-10-CM | POA: Diagnosis not present

## 2018-04-30 DIAGNOSIS — F329 Major depressive disorder, single episode, unspecified: Secondary | ICD-10-CM | POA: Diagnosis not present

## 2018-04-30 DIAGNOSIS — N184 Chronic kidney disease, stage 4 (severe): Secondary | ICD-10-CM | POA: Diagnosis not present

## 2018-04-30 DIAGNOSIS — I480 Paroxysmal atrial fibrillation: Secondary | ICD-10-CM | POA: Diagnosis not present

## 2018-04-30 DIAGNOSIS — D649 Anemia, unspecified: Secondary | ICD-10-CM | POA: Diagnosis not present

## 2018-04-30 DIAGNOSIS — K3189 Other diseases of stomach and duodenum: Secondary | ICD-10-CM | POA: Diagnosis not present

## 2018-04-30 DIAGNOSIS — S51811D Laceration without foreign body of right forearm, subsequent encounter: Secondary | ICD-10-CM | POA: Diagnosis not present

## 2018-04-30 DIAGNOSIS — I5032 Chronic diastolic (congestive) heart failure: Secondary | ICD-10-CM | POA: Diagnosis not present

## 2018-04-30 DIAGNOSIS — I130 Hypertensive heart and chronic kidney disease with heart failure and stage 1 through stage 4 chronic kidney disease, or unspecified chronic kidney disease: Secondary | ICD-10-CM | POA: Diagnosis not present

## 2018-04-30 DIAGNOSIS — J449 Chronic obstructive pulmonary disease, unspecified: Secondary | ICD-10-CM | POA: Diagnosis not present

## 2018-05-02 ENCOUNTER — Encounter: Payer: Self-pay | Admitting: Family Medicine

## 2018-05-02 ENCOUNTER — Ambulatory Visit (INDEPENDENT_AMBULATORY_CARE_PROVIDER_SITE_OTHER): Payer: Medicare HMO | Admitting: Family Medicine

## 2018-05-02 VITALS — BP 128/67 | HR 62 | Temp 97.0°F | Ht 69.0 in | Wt 156.4 lb

## 2018-05-02 DIAGNOSIS — S51811D Laceration without foreign body of right forearm, subsequent encounter: Secondary | ICD-10-CM | POA: Diagnosis not present

## 2018-05-02 DIAGNOSIS — I5032 Chronic diastolic (congestive) heart failure: Secondary | ICD-10-CM | POA: Diagnosis not present

## 2018-05-02 DIAGNOSIS — D649 Anemia, unspecified: Secondary | ICD-10-CM | POA: Diagnosis not present

## 2018-05-02 DIAGNOSIS — I130 Hypertensive heart and chronic kidney disease with heart failure and stage 1 through stage 4 chronic kidney disease, or unspecified chronic kidney disease: Secondary | ICD-10-CM | POA: Diagnosis not present

## 2018-05-02 DIAGNOSIS — F329 Major depressive disorder, single episode, unspecified: Secondary | ICD-10-CM | POA: Diagnosis not present

## 2018-05-02 DIAGNOSIS — N184 Chronic kidney disease, stage 4 (severe): Secondary | ICD-10-CM | POA: Diagnosis not present

## 2018-05-02 DIAGNOSIS — K3189 Other diseases of stomach and duodenum: Secondary | ICD-10-CM | POA: Diagnosis not present

## 2018-05-02 DIAGNOSIS — I480 Paroxysmal atrial fibrillation: Secondary | ICD-10-CM | POA: Diagnosis not present

## 2018-05-02 DIAGNOSIS — J449 Chronic obstructive pulmonary disease, unspecified: Secondary | ICD-10-CM | POA: Diagnosis not present

## 2018-05-02 NOTE — Progress Notes (Signed)
BP 128/67   Pulse 62   Temp (!) 97 F (36.1 C) (Oral)   Ht 5\' 9"  (1.753 m)   Wt 156 lb 6.4 oz (70.9 kg)   BMI 23.10 kg/m    Subjective:    Patient ID: Don [name], male    DOB: [birth year], 77 y.o.   MRN: [id number]  HPI: Don [name] is a 77 y.o. male presenting on 05/02/2018 for Laceration (2wk recheck on right forearm)   HPI Laceration recheck on right forearm Patient is coming in for 2-week recheck on right forearm laceration, he has continued to use xeroform and gauze and wrap. He denies fevers or chills.  He denies any drainage from there and he feels like it is looking a lot better.  Patient is still having some balance issues only heavily discussed this in the visit but he did not bring his cane with him today so that is something we discussed as well.  We discussed doing physical therapy but he really does not feel like it is an issue and we have continued to warn him and encouraged him to be careful to prevent further falls.  Relevant past medical, surgical, family and social history reviewed and updated as indicated. Interim medical history since our last visit reviewed. Allergies and medications reviewed and updated.  Review of Systems  Constitutional: Negative for chills and fever.  Respiratory: Negative for shortness of breath and wheezing.   Cardiovascular: Negative for chest pain and leg swelling.  Musculoskeletal: Negative for back pain and gait problem.  Skin: Positive for wound. Negative for rash.  Neurological: Negative for weakness (Patient still has balance and weakness).  All other systems reviewed and are negative.   Per HPI unless specifically indicated above   Allergies as of 05/02/2018      Reactions   Wellbutrin [bupropion] Anxiety      Medication List        Accurate as of 05/02/18 11:29 AM. Always use your most recent med list.          acetaminophen 500 MG tablet Commonly known as:  TYLENOL Take 500 mg by mouth every 6 (six) hours  as needed.   apixaban 5 MG Tabs tablet Commonly known as:  ELIQUIS Take 1 tablet (5 mg total) by mouth 2 (two) times daily.   atorvastatin 20 MG tablet Commonly known as:  LIPITOR TAKE 1 TABLET DAILY   bisoprolol 10 MG tablet Commonly known as:  ZEBETA Take 1 tablet (10 mg total) by mouth daily.   calcitRIOL 0.25 MCG capsule Commonly known as:  ROCALTROL Take 1 capsule (0.25 mcg total) by mouth daily.   clonazePAM 1 MG tablet Commonly known as:  KLONOPIN TAKE 1/2 TABLET ONCE DAILY AS NEEDED FOR ANXIETY   diltiazem 240 MG 24 hr capsule Commonly known as:  CARDIZEM CD Take 1 capsule (240 mg total) by mouth daily.   iron polysaccharides 150 MG capsule Commonly known as:  NIFEREX Take 150 mg by mouth daily.   loratadine 10 MG tablet Commonly known as:  CLARITIN Take 10 mg by mouth daily.   Melatonin 5 MG Tabs Take 5 mg by mouth at bedtime.   nystatin 100000 UNITS/ML Susp Commonly known as:  MYCOSTATIN Take by mouth every 6 (six) hours.   pantoprazole 40 MG tablet Commonly known as:  PROTONIX Take 40 mg by mouth 2 (two) times daily.   terazosin 2 MG capsule Commonly known as:  HYTRIN Take 2 mg by mouth at  bedtime.          Objective:    BP 128/67   Pulse 62   Temp (!) 97 F (36.1 C) (Oral)   Ht 5\' 9"  (1.753 m)   Wt 156 lb 6.4 oz (70.9 kg)   BMI 23.10 kg/m   Wt Readings from Last 3 Encounters:  05/02/18 156 lb 6.4 oz (70.9 kg)  04/18/18 165 lb (74.8 kg)  04/11/18 159 lb (72.1 kg)    Physical Exam  Constitutional: He is oriented to person, place, and time. He appears well-developed and well-nourished. No distress.  Neurological: He is alert and oriented to person, place, and time. Coordination normal.  Skin: Skin is warm and dry. Laceration (Forearm laceration has a small area about 0.2 cm x 1-1/2 cm that still has slightly proud tissue and is not completely healed but is much improved.) noted. No rash noted. He is not diaphoretic.  Psychiatric: He has  a normal mood and affect. His behavior is normal.  Nursing note and vitals reviewed.     Assessment & Plan:   Problem List Items Addressed This Visit    None    Visit Diagnoses    Laceration of right forearm, subsequent encounter    -  Primary   looking much better continue for 2 weeks, see back as needed       Follow up plan: Return if symptoms worsen or fail to improve.  Counseling provided for all of the vaccine components No orders of the defined types were placed in this encounter.   Don Pina, MD [facility] 05/02/2018, 11:29 AM

## 2018-05-06 DIAGNOSIS — D649 Anemia, unspecified: Secondary | ICD-10-CM | POA: Diagnosis not present

## 2018-05-06 DIAGNOSIS — I130 Hypertensive heart and chronic kidney disease with heart failure and stage 1 through stage 4 chronic kidney disease, or unspecified chronic kidney disease: Secondary | ICD-10-CM | POA: Diagnosis not present

## 2018-05-06 DIAGNOSIS — N184 Chronic kidney disease, stage 4 (severe): Secondary | ICD-10-CM | POA: Diagnosis not present

## 2018-05-06 DIAGNOSIS — K3189 Other diseases of stomach and duodenum: Secondary | ICD-10-CM | POA: Diagnosis not present

## 2018-05-06 DIAGNOSIS — J449 Chronic obstructive pulmonary disease, unspecified: Secondary | ICD-10-CM | POA: Diagnosis not present

## 2018-05-06 DIAGNOSIS — I480 Paroxysmal atrial fibrillation: Secondary | ICD-10-CM | POA: Diagnosis not present

## 2018-05-06 DIAGNOSIS — F329 Major depressive disorder, single episode, unspecified: Secondary | ICD-10-CM | POA: Diagnosis not present

## 2018-05-06 DIAGNOSIS — I5032 Chronic diastolic (congestive) heart failure: Secondary | ICD-10-CM | POA: Diagnosis not present

## 2018-05-06 DIAGNOSIS — S51811D Laceration without foreign body of right forearm, subsequent encounter: Secondary | ICD-10-CM | POA: Diagnosis not present

## 2018-05-10 DIAGNOSIS — I130 Hypertensive heart and chronic kidney disease with heart failure and stage 1 through stage 4 chronic kidney disease, or unspecified chronic kidney disease: Secondary | ICD-10-CM | POA: Diagnosis not present

## 2018-05-10 DIAGNOSIS — K3189 Other diseases of stomach and duodenum: Secondary | ICD-10-CM | POA: Diagnosis not present

## 2018-05-10 DIAGNOSIS — J449 Chronic obstructive pulmonary disease, unspecified: Secondary | ICD-10-CM | POA: Diagnosis not present

## 2018-05-10 DIAGNOSIS — F329 Major depressive disorder, single episode, unspecified: Secondary | ICD-10-CM | POA: Diagnosis not present

## 2018-05-10 DIAGNOSIS — S51811D Laceration without foreign body of right forearm, subsequent encounter: Secondary | ICD-10-CM | POA: Diagnosis not present

## 2018-05-10 DIAGNOSIS — D649 Anemia, unspecified: Secondary | ICD-10-CM | POA: Diagnosis not present

## 2018-05-10 DIAGNOSIS — N184 Chronic kidney disease, stage 4 (severe): Secondary | ICD-10-CM | POA: Diagnosis not present

## 2018-05-10 DIAGNOSIS — I480 Paroxysmal atrial fibrillation: Secondary | ICD-10-CM | POA: Diagnosis not present

## 2018-05-10 DIAGNOSIS — I5032 Chronic diastolic (congestive) heart failure: Secondary | ICD-10-CM | POA: Diagnosis not present

## 2018-05-14 DIAGNOSIS — I5032 Chronic diastolic (congestive) heart failure: Secondary | ICD-10-CM | POA: Diagnosis not present

## 2018-05-14 DIAGNOSIS — I130 Hypertensive heart and chronic kidney disease with heart failure and stage 1 through stage 4 chronic kidney disease, or unspecified chronic kidney disease: Secondary | ICD-10-CM | POA: Diagnosis not present

## 2018-05-14 DIAGNOSIS — D649 Anemia, unspecified: Secondary | ICD-10-CM | POA: Diagnosis not present

## 2018-05-14 DIAGNOSIS — S51811D Laceration without foreign body of right forearm, subsequent encounter: Secondary | ICD-10-CM | POA: Diagnosis not present

## 2018-05-14 DIAGNOSIS — F329 Major depressive disorder, single episode, unspecified: Secondary | ICD-10-CM | POA: Diagnosis not present

## 2018-05-14 DIAGNOSIS — K3189 Other diseases of stomach and duodenum: Secondary | ICD-10-CM | POA: Diagnosis not present

## 2018-05-14 DIAGNOSIS — I480 Paroxysmal atrial fibrillation: Secondary | ICD-10-CM | POA: Diagnosis not present

## 2018-05-14 DIAGNOSIS — J449 Chronic obstructive pulmonary disease, unspecified: Secondary | ICD-10-CM | POA: Diagnosis not present

## 2018-05-14 DIAGNOSIS — N184 Chronic kidney disease, stage 4 (severe): Secondary | ICD-10-CM | POA: Diagnosis not present

## 2018-05-16 DIAGNOSIS — I5032 Chronic diastolic (congestive) heart failure: Secondary | ICD-10-CM | POA: Diagnosis not present

## 2018-05-16 DIAGNOSIS — I130 Hypertensive heart and chronic kidney disease with heart failure and stage 1 through stage 4 chronic kidney disease, or unspecified chronic kidney disease: Secondary | ICD-10-CM | POA: Diagnosis not present

## 2018-05-16 DIAGNOSIS — J449 Chronic obstructive pulmonary disease, unspecified: Secondary | ICD-10-CM | POA: Diagnosis not present

## 2018-05-16 DIAGNOSIS — I480 Paroxysmal atrial fibrillation: Secondary | ICD-10-CM | POA: Diagnosis not present

## 2018-05-16 DIAGNOSIS — S51811D Laceration without foreign body of right forearm, subsequent encounter: Secondary | ICD-10-CM | POA: Diagnosis not present

## 2018-05-16 DIAGNOSIS — F329 Major depressive disorder, single episode, unspecified: Secondary | ICD-10-CM | POA: Diagnosis not present

## 2018-05-16 DIAGNOSIS — N184 Chronic kidney disease, stage 4 (severe): Secondary | ICD-10-CM | POA: Diagnosis not present

## 2018-05-16 DIAGNOSIS — K3189 Other diseases of stomach and duodenum: Secondary | ICD-10-CM | POA: Diagnosis not present

## 2018-05-16 DIAGNOSIS — D649 Anemia, unspecified: Secondary | ICD-10-CM | POA: Diagnosis not present

## 2018-05-17 ENCOUNTER — Other Ambulatory Visit: Payer: Self-pay | Admitting: Family Medicine

## 2018-05-22 ENCOUNTER — Other Ambulatory Visit: Payer: Self-pay | Admitting: Family Medicine

## 2018-05-23 NOTE — Telephone Encounter (Signed)
Last seen 05/02/18.

## 2018-05-24 ENCOUNTER — Other Ambulatory Visit: Payer: Self-pay | Admitting: *Deleted

## 2018-05-24 DIAGNOSIS — N184 Chronic kidney disease, stage 4 (severe): Secondary | ICD-10-CM | POA: Diagnosis not present

## 2018-05-24 DIAGNOSIS — F329 Major depressive disorder, single episode, unspecified: Secondary | ICD-10-CM | POA: Diagnosis not present

## 2018-05-24 DIAGNOSIS — I5032 Chronic diastolic (congestive) heart failure: Secondary | ICD-10-CM | POA: Diagnosis not present

## 2018-05-24 DIAGNOSIS — S51811D Laceration without foreign body of right forearm, subsequent encounter: Secondary | ICD-10-CM | POA: Diagnosis not present

## 2018-05-24 DIAGNOSIS — J449 Chronic obstructive pulmonary disease, unspecified: Secondary | ICD-10-CM | POA: Diagnosis not present

## 2018-05-24 DIAGNOSIS — I480 Paroxysmal atrial fibrillation: Secondary | ICD-10-CM | POA: Diagnosis not present

## 2018-05-24 DIAGNOSIS — K3189 Other diseases of stomach and duodenum: Secondary | ICD-10-CM | POA: Diagnosis not present

## 2018-05-24 DIAGNOSIS — D649 Anemia, unspecified: Secondary | ICD-10-CM | POA: Diagnosis not present

## 2018-05-24 DIAGNOSIS — I130 Hypertensive heart and chronic kidney disease with heart failure and stage 1 through stage 4 chronic kidney disease, or unspecified chronic kidney disease: Secondary | ICD-10-CM | POA: Diagnosis not present

## 2018-05-24 MED ORDER — DILTIAZEM HCL ER COATED BEADS 240 MG PO CP24
240.0000 mg | ORAL_CAPSULE | Freq: Every day | ORAL | 1 refills | Status: DC
Start: 2018-05-24 — End: 2018-07-16

## 2018-05-24 MED ORDER — APIXABAN 5 MG PO TABS: 5.0000 mg | ORAL_TABLET | Freq: Two times a day (BID) | ORAL | 1 refills | Status: DC

## 2018-06-03 DIAGNOSIS — Z0289 Encounter for other administrative examinations: Secondary | ICD-10-CM

## 2018-06-04 ENCOUNTER — Other Ambulatory Visit: Payer: Self-pay | Admitting: Family

## 2018-06-17 ENCOUNTER — Other Ambulatory Visit: Payer: Self-pay | Admitting: Family Medicine

## 2018-07-09 ENCOUNTER — Encounter: Payer: Self-pay | Admitting: Cardiology

## 2018-07-09 NOTE — Progress Notes (Signed)
Cardiology Office Note   Date:  07/10/2018   ID:  Don, [name] 11/10/1941, MRN [id number]  PCP:  Dettinger, Don Kaufmann, MD  Cardiologist:   No primary care provider on file.   Chief Complaint  Patient presents with  . Atrial Fibrillation      History of Present Illness: Don [name] is a 77 y.o. male who presents for evaluation of atrial fib.   It turns out this is actually a preop evaluation.  He was seen in 2015 and he had atrial fibrillation and rapid rate and an elevated troponin.  Initially he did not want a stress test but he eventually did have one in December 2015 and there was no evidence of ischemia.  He had suggestion of a low ejection fraction but a follow-up echo done in February 2018 demonstrated the ejection fraction to be normal.  He was last hospitalized in our system at [facility] with suicidal ideation.  I do note that he had atrial fibrillation with rapid rate.  He had anemia.  There were no acute cardiac issues and is been managed with anticoagulation.  He actually has no cardiovascular complaints.  He does have some chronic dyspnea but he continues to smoke cigarettes.  He might get short of breath walking up the incline from his garbage cans.  He denies any resting shortness of breath, PND or orthopnea.  No chest pressure, neck or arm discomfort.  He sometimes will notice palpitations if he gets to hurrying but is not having any presyncope or syncope.  He does have some chronic lower extremity swelling and venous stasis changes but he wears compression stockings.  He has end-stage renal disease and he is being considered for dialysis graft fistula and this is a preop evaluation.  I do see notes from the [place] which mostly resolved around his renal insufficiency.  He does have a chronic anemia.    Past Medical History:  Diagnosis Date  . Acute bronchitis 04/03/2014  . Anxiety   . Atrial fibrillation ([place])   . Cataract   . COPD (chronic obstructive pulmonary disease)  ([place])   . Essential hypertension   . Hyperlipidemia   . Macular degeneration   . Noncompliance with medications 04/2013   Xarelto, digoxin previously  . Pre-diabetes   . Prostate cancer (Don [name])   . Stage III chronic kidney disease 04/03/2014  . Tubular adenoma of colon 07/31/02, 11/18/03    Past Surgical History:  Procedure Laterality Date  . Anal abcess,Hemorroids,    . COLONOSCOPY N/A 04/05/2014   Dr. Fuller Plan: 6 mm sessile polyp from sigmoid colon (tubular adenoma), internal hemorrhoids  . COLONOSCOPY W/ BIOPSIES  11/18/2003   Dr. Earle Gell  . ESOPHAGOGASTRODUODENOSCOPY  11/18/2003   Dr. Earle Gell  . ESOPHAGOGASTRODUODENOSCOPY N/A 04/05/2014   Dr. Fuller Plan: variable Z line, negative Barrett's, small hiatal hernia  . ESOPHAGOGASTRODUODENOSCOPY N/A 02/02/2017   Dr. Oneida Alar: LA Grade B esophagitis, one moderate benign-appearing intrinsic stenosis traversed, small non-bleeding diverticulum in second portion of duodenum, reactive gastropathy, no H.pylori.  . ESOPHAGOGASTRODUODENOSCOPY N/A 03/22/2018   Procedure: ESOPHAGOGASTRODUODENOSCOPY (EGD);  Surgeon: Daneil Dolin, MD;  [facility];  Service: Endoscopy;  Laterality: N/A;  . GIVENS CAPSULE STUDY N/A 03/22/2018   Procedure: GIVENS CAPSULE STUDY;  Surgeon: Daneil Dolin, MD;  Location: [facility];  Service: Endoscopy;  Laterality: N/A;  . RETROPUBIC PROSTATECTOMY  11/26/2001  . TONSILLECTOMY       Current Outpatient Medications  Medication  Sig Dispense Refill  . acetaminophen (TYLENOL) 500 MG tablet Take 500 mg by mouth every 6 (six) hours as needed.    . Albuterol Sulfate (PROAIR HFA IN) Inhale into the lungs 2 (two) times daily.    [name] apixaban (ELIQUIS) 5 MG TABS tablet Take 1 tablet (5 mg total) by mouth 2 (two) times daily. 60 tablet 1  . atorvastatin (LIPITOR) 20 MG tablet TAKE 1 TABLET DAILY 30 tablet 5  . bisoprolol (ZEBETA) 10 MG tablet Take 1 tablet (10 mg total) by mouth daily. 30 tablet 1  .  budesonide-formoterol (SYMBICORT) 160-4.5 MCG/ACT inhaler Inhale 2 puffs into the lungs 2 (two) times daily.    . calcitRIOL (ROCALTROL) 0.25 MCG capsule TAKE (1) CAPSULE DAILY 30 capsule 5  . clonazePAM (KLONOPIN) 1 MG tablet TAKE 1/2 TABLET ONCE DAILY AS NEEDED FOR ANXIETY 15 tablet 2  . diltiazem (CARDIZEM CD) 240 MG 24 hr capsule Take 1 capsule (240 mg total) by mouth daily. 30 capsule 1  . iron polysaccharides (NIFEREX) 150 MG capsule Take 150 mg by mouth daily.    [name] loratadine (CLARITIN) 10 MG tablet Take 10 mg by mouth daily.    . Melatonin 5 MG TABS Take 5 mg by mouth at bedtime.    . Multiple Vitamins-Minerals (PRESERVISION AREDS 2 PO) Take by mouth 2 (two) times daily.    [name] nystatin (MYCOSTATIN) 100000 UNITS/ML SUSP Take by mouth as needed.     . pantoprazole (PROTONIX) 40 MG tablet Take 40 mg by mouth 2 (two) times daily.    [name] terazosin (HYTRIN) 2 MG capsule TAKE 1 CAPSULE AT BEDTIME 90 capsule 0   No current facility-administered medications for this visit.     Allergies:   Wellbutrin [bupropion]   ROS:  Please see the history of present illness.   Otherwise, review of systems are positive for none.   All other systems are reviewed and negative.    PHYSICAL EXAM: VS:  BP 102/60   Ht 5\' 9"  (1.753 m)   Wt 154 lb (69.9 kg)   BMI 22.74 kg/m  , BMI Body mass index is 22.74 kg/m. GENERAL:  Well appearing HEENT:  Pupils equal round and reactive, fundi not visualized, oral mucosa unremarkable NECK:  No jugular venous distention, waveform within normal limits, carotid upstroke brisk and symmetric, no bruits, no thyromegaly LYMPHATICS:  No cervical, inguinal adenopathy LUNGS:  Clear to auscultation bilaterally BACK:  No CVA tenderness CHEST:  Unremarkable HEART:  PMI not displaced or sustained,S1 and S2 within normal limits, no S3, no clicks, no rubs, no murmurs, irregular ABD:  Flat, positive bowel sounds normal in frequency in pitch, no bruits, no rebound, no guarding, no  midline pulsatile mass, no hepatomegaly, no splenomegaly EXT:  2 plus pulses throughout, moderate right greater than left lower extremity edema edema, no cyanosis no clubbing SKIN:  No rashes no nodules NEURO:  Cranial nerves II through XII grossly intact, motor grossly intact throughout PSYCH:  Cognitively intact, oriented to person place and time   EKG:  EKG is not ordered today. The ekg ordered 03/17/18 demonstrates atrial fibrillation, rate 120, low voltage in the limb leads, no acute ST-T wave changes.   Recent Labs: 03/18/2018: ALT 12; TSH 3.173 03/27/2018: BUN 35; Creatinine, Ser 2.97; Hemoglobin 8.7; Platelets 262; Potassium 3.8; Sodium 139    Lipid Panel    Component Value Date/Time   CHOL 107 03/01/2017 1110   TRIG 126 03/01/2017 1110   TRIG 176 (H) 10/16/2013 1318  HDL 27 (L) 03/01/2017 1110   HDL 35 (L) 10/16/2013 1318   CHOLHDL 4.0 03/01/2017 1110   LDLCALC 55 03/01/2017 1110   LDLCALC 90 10/16/2013 1318      Wt Readings from Last 3 Encounters:  07/10/18 154 lb (69.9 kg)  05/02/18 156 lb 6.4 oz (70.9 kg)  04/18/18 165 lb (74.8 kg)      Other studies Reviewed: Additional studies/ records that were reviewed today [facility] and [facility] records. Review of the above records demonstrates:  Please see elsewhere in the note.     ASSESSMENT AND PLAN:  Atrial fibrillation with RVR   Mr. LEVOY GEISEN has a CHA2DS2 - VASc score 3.   He seems to be tolerating anticoagulation.  I do note that there was anemia but it was thought to be of chronic disease.  He does not have any evidence of active bleeding.  He was evaluated at the hospital.  I did talk with him and his companion and said there was supposed to be follow-up of this with an EGD and that he should have routine follow-up labs but this should be per his primary providers in the [place].   Elevated troponin / demand ischemia/abnormal echo He had a negative perfusion study in 2015.   He has no high risk findings by  exam or history.  He has had no change in his symptoms since his perfusion study.  No further evaluation is indicated.   Hyperlipidemia  He is followed at the [place].   Hypertension  The blood pressure is at target .  No change in therapy.   Tobacco abuse  We discussed the need to stop smoking.  Stage III CKD (chronic kidney disease)  His creat is followed by the [facility] and now nephrology.  Preop  He is being considered for dialysis fistula placement but this has not been scheduled.  At this point given the fact that this is not a high risk procedure.  He has no high risk symptoms or findings.  He has a good functional level.  He had a negative stress perfusion study a few years ago.  There are no contraindications if this is done in the next 6 months.  He would be able to hold his Eliquis as necessary for a couple of days prior to this.    Current medicines are reviewed at length with the patient today.  The patient does not have concerns regarding medicines.  The following changes have been made:  no change  Labs/ tests ordered today include:   Orders Placed This Encounter  Procedures  . EKG 12-Lead     Disposition:   FU with me in one year or sooner if needed.     Signed, Minus Breeding, MD  07/10/2018 5:09 PM    [facility]

## 2018-07-10 ENCOUNTER — Ambulatory Visit (INDEPENDENT_AMBULATORY_CARE_PROVIDER_SITE_OTHER): Payer: Medicare HMO | Admitting: Cardiology

## 2018-07-10 ENCOUNTER — Encounter: Payer: Self-pay | Admitting: Cardiology

## 2018-07-10 VITALS — BP 102/60 | Ht 69.0 in | Wt 154.0 lb

## 2018-07-10 DIAGNOSIS — I10 Essential (primary) hypertension: Secondary | ICD-10-CM

## 2018-07-10 DIAGNOSIS — I4891 Unspecified atrial fibrillation: Secondary | ICD-10-CM | POA: Diagnosis not present

## 2018-07-10 DIAGNOSIS — Z01810 Encounter for preprocedural cardiovascular examination: Secondary | ICD-10-CM

## 2018-07-10 NOTE — Patient Instructions (Addendum)

## 2018-07-11 ENCOUNTER — Telehealth: Payer: Self-pay | Admitting: Cardiology

## 2018-07-11 NOTE — Telephone Encounter (Signed)
New Message:    Pt said he saw Dr Percival Spanish yesterday as a New Patient. He said he needs somebody to explain to him what he condition is. He said he needs somebody to break it down, where he can understand it please.a

## 2018-07-11 NOTE — Telephone Encounter (Signed)
I spoke with pt. He has questions about his health. He states he has no energy and no motivation.  Wants to know his physical condition on a 1-10 scale. I told him he was seen by Dr Percival Spanish yesterday for atrial fib and surgical clearance. He reports he may be having dialysis soon.  I told pt he should make sure to take all his medications.  He states he has shortness of breath at times. This is noted in [name] note. Pt then stated he has no chest pain on a regular basis but sometimes he gets a "lump" in his chest when he tries to put on and take off his support stockings and at other times when he overworks himself. Pt reported he is trying to get his driver's license back. He states he sits at home and has limited friends.  States his son does help him when needed. I offered to call his son but pt does not want me to do this He states he has called 911 in the past when feeling suicidal. Pt denies feeling suicidal at this time. States he does not believe in suicide. I advised pt to contact primary care today regarding lack of motivation and energy. I advised pt to call 911 if feeling suicidal.  I told him I would forward note to Dr Percival Spanish.

## 2018-07-16 ENCOUNTER — Other Ambulatory Visit: Payer: Self-pay | Admitting: Family Medicine

## 2018-07-16 NOTE — Telephone Encounter (Signed)
I agree that he needs to follow up with his PCP.  He does have significant chronic health problems.

## 2018-07-17 ENCOUNTER — Telehealth: Payer: Self-pay | Admitting: *Deleted

## 2018-07-17 MED ORDER — TRIAMCINOLONE ACETONIDE 0.1 % EX CREA: 1.0000 "application " | TOPICAL_CREAM | Freq: Two times a day (BID) | CUTANEOUS | 1 refills | Status: DC

## 2018-07-17 NOTE — Telephone Encounter (Signed)
Pharmacy will contact patient with refill.

## 2018-07-17 NOTE — Telephone Encounter (Signed)
Fax received [facility] pharmacy Triamcinolone 0.1% cream Pt would like a jar of cream Medication is not on current list Please advise

## 2018-07-17 NOTE — Telephone Encounter (Signed)
I sent a jar of triamcinolone for him.

## 2018-07-18 ENCOUNTER — Encounter: Payer: Medicare HMO | Admitting: *Deleted

## 2018-07-19 ENCOUNTER — Telehealth: Payer: Self-pay | Admitting: Family Medicine

## 2018-07-31 ENCOUNTER — Ambulatory Visit (INDEPENDENT_AMBULATORY_CARE_PROVIDER_SITE_OTHER): Payer: Medicare HMO

## 2018-07-31 ENCOUNTER — Other Ambulatory Visit: Payer: Self-pay | Admitting: Family Medicine

## 2018-07-31 VITALS — BP 133/76 | HR 64 | Temp 97.2°F | Ht 69.0 in | Wt 153.0 lb

## 2018-07-31 DIAGNOSIS — Z0000 Encounter for general adult medical examination without abnormal findings: Secondary | ICD-10-CM

## 2018-07-31 NOTE — Telephone Encounter (Signed)
Patient was seen for AWV today, medications were discussed at that visit

## 2018-07-31 NOTE — Progress Notes (Signed)
Subjective:   Don [name] is a 77 y.o. male who presents for Medicare Annual/Subsequent preventive examination. 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  He has one son and two grandchildren.  He enjoys working around [employer] and previously did a lot of woodworking although he admits he does not do that as much anymore.    Review of Systems:   Cardiac Risk Factors include: advanced age (>44men, >98 women);dyslipidemia;hypertension;male gender;sedentary lifestyle;smoking/ tobacco exposure     Objective:    Vitals: BP 133/76   Pulse 64   Temp (!) 97.2 F (36.2 C) (Oral)   Ht 5\' 9"  (1.753 m)   Wt 153 lb (69.4 kg)   BMI 22.59 kg/m   Body mass index is 22.59 kg/m.  Advanced Directives 07/31/2018 03/18/2018 03/17/2018 03/15/2018 03/15/2018 02/12/2018 06/14/2017  Does Patient Have a Medical Advance Directive? Yes Yes Yes Yes Yes Yes Yes  Type of [place] administrator Power of [place] of [place] of [place];Living will [place]  Does patient want to make changes to medical advance directive? No - Patient declined - No - Patient declined - No - Patient declined No - Patient declined No - Patient declined  Copy of [place] in Chart? Yes No - copy requested No - copy requested No - copy requested No - copy requested No - copy requested No - copy requested  Would patient like information on creating a medical advance directive? - - - - - - -  Pre-existing out of facility DNR order (yellow form or pink MOST form) - - - - - - -    Tobacco Social History   Tobacco Use  Smoking Status Current Some Day Smoker  . Packs/day: 0.50  . Years: 62.00  . Pack years: 31.00  . Types: Cigarettes, Cigars  . Last attempt to quit: 12/28/2016  . Years since quitting: 1.5    Smokeless Tobacco Never Used       Clinical Intake:    Past Medical History:  Diagnosis Date  . Acute bronchitis 04/03/2014  . Anxiety   . Atrial fibrillation ([place])   . Cataract   . COPD (chronic obstructive pulmonary disease) ([place])   . Essential hypertension   . Hyperlipidemia   . Macular degeneration   . Noncompliance with medications 04/2013   Xarelto, digoxin previously  . Pre-diabetes   . Prostate cancer ([place])   . Stage III chronic kidney disease 04/03/2014  . Tubular adenoma of colon 07/31/02, 11/18/03   Past Surgical History:  Procedure Laterality Date  . Anal abcess,Hemorroids,    . COLONOSCOPY N/A 04/05/2014   Dr. Fuller Plan: 6 mm sessile polyp from sigmoid colon (tubular adenoma), internal hemorrhoids  . COLONOSCOPY W/ BIOPSIES  11/18/2003   Dr. Earle Gell  . ESOPHAGOGASTRODUODENOSCOPY  11/18/2003   Dr. Earle Gell  . ESOPHAGOGASTRODUODENOSCOPY N/A 04/05/2014   Dr. Fuller Plan: variable Z line, negative Barrett's, small hiatal hernia  . ESOPHAGOGASTRODUODENOSCOPY N/A 02/02/2017   Dr. Oneida Alar: LA Grade B esophagitis, one moderate benign-appearing intrinsic stenosis traversed, small non-bleeding diverticulum in second portion of duodenum, reactive gastropathy, no H.pylori.  . ESOPHAGOGASTRODUODENOSCOPY N/A 03/22/2018   Procedure: ESOPHAGOGASTRODUODENOSCOPY (EGD);  Surgeon: Daneil Dolin, MD;  [facility];  Service: Endoscopy;  Laterality: N/A;  . GIVENS CAPSULE STUDY N/A 03/22/2018   Procedure:  GIVENS CAPSULE STUDY;  Surgeon: Daneil Dolin, MD;  [facility];  Service: Endoscopy;  Laterality: N/A;  . RETROPUBIC PROSTATECTOMY  11/26/2001  . TONSILLECTOMY     Family History  Problem Relation Age of Onset  . Diabetes Father   . Heart disease Father 73       MI  . Heart attack Father   . Heart attack Mother   . Heart disease Brother   . Cancer Brother        lung  . Lung cancer Brother   . Heart disease Brother   . Lung cancer Sister   .  Cancer Sister        lung  . Heart disease Brother   . Heart disease Brother    Social History   Socioeconomic History  . Marital status: Widowed    Spouse name: Not on file  . Number of children: 0  . Years of education: Not on file  . Highest education level: Not on file  Occupational History  . Occupation: retired  Scientific laboratory technician  . Financial resource strain: Not on file  . Food insecurity:    Worry: Not on file    Inability: Not on file  . Transportation needs:    Medical: Not on file    Non-medical: Not on file  Tobacco Use  . Smoking status: Current Some Day Smoker    Packs/day: 0.50    Years: 62.00    Pack years: 31.00    Types: Cigarettes, Cigars    Last attempt to quit: 12/28/2016    Years since quitting: 1.5  . Smokeless tobacco: Never Used  Substance and Sexual Activity  . Alcohol use: No  . Drug use: No  . Sexual activity: Never  Lifestyle  . Physical activity:    Days per week: 0 days    Minutes per session: 0 min  . Stress: Not at all  Relationships  . Social connections:    Talks on phone: Never    Gets together: Once a week    Attends religious service: Never    Active member of club or organization: No    Attends meetings of clubs or organizations: Never    Relationship status: Widowed  Other Topics Concern  . 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      Outpatient Encounter Medications as of 07/31/2018  Medication Sig  . acetaminophen (TYLENOL) 500 MG tablet Take 500 mg by mouth every 6 (six) hours as needed.  . Albuterol Sulfate (PROAIR HFA IN) Inhale into the lungs 2 (two) times daily.  [name] apixaban (ELIQUIS) 5 MG TABS tablet Take 1 tablet (5 mg total) by mouth 2 (two) times daily. Needs to be seen  . atorvastatin (LIPITOR) 20 MG tablet TAKE 1 TABLET DAILY  . bisoprolol (ZEBETA) 10 MG tablet Take 1 tablet (10 mg total) by mouth daily.  . budesonide-formoterol  (SYMBICORT) 160-4.5 MCG/ACT inhaler Inhale 2 puffs into the lungs 2 (two) times daily.  . calcitRIOL (ROCALTROL) 0.25 MCG capsule TAKE (1) CAPSULE DAILY  . clonazePAM (KLONOPIN) 1 MG tablet TAKE 1/2 TABLET ONCE DAILY AS NEEDED FOR ANXIETY  . diltiazem (CARDIZEM CD) 240 MG 24 hr capsule Take 1 capsule (240 mg total) by mouth daily. Needs to be seen  . iron polysaccharides (NIFEREX) 150 MG capsule Take 150 mg by mouth daily.  [name] loratadine (CLARITIN) 10 MG  tablet Take 10 mg by mouth daily.  . Melatonin 5 MG TABS Take 5 mg by mouth at bedtime.  . Multiple Vitamins-Minerals (PRESERVISION AREDS 2 PO) Take by mouth 2 (two) times daily.  . pantoprazole (PROTONIX) 40 MG tablet Take 40 mg by mouth 2 (two) times daily.  [name] terazosin (HYTRIN) 2 MG capsule TAKE 1 CAPSULE AT BEDTIME  . [DISCONTINUED] nystatin (MYCOSTATIN) 100000 UNITS/ML SUSP Take by mouth as needed.   . [DISCONTINUED] triamcinolone cream (KENALOG) 0.1 % Apply 1 application topically 2 (two) times daily.   No facility-administered encounter medications on file as of 07/31/2018.     Activities of Daily Living In your present state of health, do you have any difficulty performing the following activities: 07/31/2018 03/18/2018  Hearing? Y -  Comment does not hear as well with his right ear  -  Vision? N -  Difficulty concentrating or making decisions? Y -  Walking or climbing stairs? N -  Dressing or bathing? N -  Doing errands, shopping? Y N  Comment does not have drivers license Facilities manager and eating ? N -  Using the Toilet? N -  In the past six months, have you accidently leaked urine? Y -  Do you have problems with loss of bowel control? N -  Managing your Medications? Y -  Comment medications are prepackaged by [facility] your Finances? Y -  Housekeeping or managing your Housekeeping? N -  Some recent data might be hidden  Patient reports he has noticed recently some decreased hearing in his right ear.   His stepson who is with him today believes he does have some difficulty at times in making decisions and sticking to them.  He is unable to do errands on his own, he does not have a drivers license.  His medications are prepackaged and are delivered to him by [facility].  His stepson, [name], manages his finances for him.  Patient Care Team: Dettinger, Fransisca Kaufmann, MD as PCP - General (Family Medicine) Fran Lowes, MD as Consulting Physician (Nephrology) Sherlynn Stalls, MD as Consulting Physician (Ophthalmology) Melina Schools, OD as Consulting Physician (Optometry) Gatha Mayer, MD as Consulting Physician (Gastroenterology)   Assessment:   This is a routine wellness examination for [place].  Exercise Activities and Dietary recommendations Current Exercise Habits: The patient does not participate in regular exercise at present  Goals    . DIET - EAT MORE FRUITS AND VEGETABLES    . Have 3 meals a day       Fall Risk Fall Risk  07/31/2018 04/11/2018 04/04/2018 03/04/2018 08/14/2017  Falls in the past year? Yes Yes Yes Yes Yes  Number falls in past yr: 2 or more 2 or more 2 or more 1 2 or more  Injury with Fall? Yes Yes Yes Yes Yes  Comment laceration to right arm laceration to right arm leg pain, wrist pain leg pain -  Risk for fall due to : - - - - History of fall(s);Medication side effect  Follow up - - - - Falls evaluation completed;Education provided;Falls prevention discussed   Is the patient's home free of loose throw rugs in walkways, pet beds, electrical cords, etc?   Yes      Grab bars in the bathroom? No      Handrails on the stairs?   Yes      Adequate lighting?   Yes   Depression Screen PHQ 2/9 Scores 07/31/2018 05/02/2018 04/11/2018  04/04/2018  PHQ - 2 Score 0 0 1 2  PHQ- 9 Score - - - 10    Cognitive Function MMSE - Mini Mental State Exam 07/31/2018 07/24/2016 07/23/2015  Orientation to time 3 5 5   Orientation to Place 5 5 5   Registration 3 3 3   Attention/  Calculation 5 - 3  Recall 3 3 3   Language- name 2 objects 2 - 2  Language- repeat 1 - 1  Language- follow 3 step command 3 - 3  Language- read & follow direction 1 - 1  Write a sentence 1 - 1  Copy design 1 - 1  Total score 28 - 28   Patient performed well on his MMSE, scoring 28 out of 30 available points.  Immunization History  Administered Date(s) Administered  . Influenza, High Dose Seasonal PF 01/02/2018  . PPD Test 03/02/2017  . Pneumococcal Conjugate-13 07/23/2015  . Pneumococcal Polysaccharide-23 09/19/2016  . Tdap 03/16/2018    Qualifies for Shingles Vaccine?  Yes  Screening Tests Health Maintenance  Topic Date Due  . INFLUENZA VACCINE  10/30/2018 (Originally 06/13/2018)  . COLONOSCOPY  04/06/2019  . TETANUS/TDAP  03/16/2028  . PNA vac Low Risk Adult  Completed   Cancer Screenings: Lung: Low Dose CT Chest recommended if Age 78-80 years, 30 pack-year currently smoking OR have quit w/in 15years. Patient does qualify. Colorectal: up to date, 04/05/2014      Plan:     Follow up with PCP for regularly scheduled appointments.  I have personally reviewed and noted the following in the patient's chart:   . Medical and social history . Use of alcohol, tobacco or illicit drugs  . Current medications and supplements . Functional ability and status . Nutritional status . Physical activity . Advanced directives . List of other physicians . Hospitalizations, surgeries, and ER visits in previous 12 months . Vitals . Screenings to include cognitive, depression, and falls . Referrals and appointments  In addition, I have reviewed and discussed with patient certain preventive protocols, quality metrics, and best practice recommendations. A written personalized care plan for preventive services as well as general preventive health recommendations were provided to patient.     Burnadette Pop, LPN  05/02/3558

## 2018-07-31 NOTE — Patient Instructions (Signed)
  Mr. Tuohey , Thank you for taking time to come for your Medicare Wellness Visit. I appreciate your ongoing commitment to your health goals. Please review the following plan we discussed and let me know if I can assist you in the future.   These are the goals we discussed: Goals    . DIET - EAT MORE FRUITS AND VEGETABLES    . Have 3 meals a day       This is a list of the screening recommended for you and due dates:  Health Maintenance  Topic Date Due  . Flu Shot  10/30/2018*  . Colon Cancer Screening  04/06/2019  . Tetanus Vaccine  03/16/2028  . Pneumonia vaccines  Completed  *Topic was postponed. The date shown is not the original due date.

## 2018-08-02 ENCOUNTER — Telehealth: Payer: Self-pay

## 2018-08-02 NOTE — Telephone Encounter (Signed)
Spoke with [name] at [facility] request.  Dr Percival Spanish did see this pt and pt has already been given clearance for fistula placement from a cardiac standpoint.  According to appts pt is scheduled to be seen by Dr Fletcher Anon 08/06/18 at 9:40 am at the [facility] office to discuss this.  [name] is aware.

## 2018-08-02 NOTE — Telephone Encounter (Signed)
Patient and his Don [name] were in our office for an annual wellness visit this week.  The step-son would like for you to call him.  He states that his dad was sent to [name] office by the [facility] to discuss possible fitness for having dialysis.  [name] is wanting to know if there has been any information from the office sent back to the [place] regarding this and would like for you to call him.

## 2018-08-05 ENCOUNTER — Telehealth: Payer: Self-pay | Admitting: Cardiology

## 2018-08-05 NOTE — Telephone Encounter (Signed)
Spoke to patient's POA [name]) and informed him that we would fax the clearance for his fistula to the dialysis department at the [facility].

## 2018-08-05 NOTE — Telephone Encounter (Signed)
New message   Patient's POA is calling to see if there has been a clearance done for the procedure for the patient to have dialysis. He states the clearance has to be sent to the [facility]. Please call to discuss.

## 2018-08-06 ENCOUNTER — Ambulatory Visit: Payer: Medicare HMO | Admitting: Cardiovascular Disease

## 2018-08-07 ENCOUNTER — Telehealth: Payer: Self-pay

## 2018-08-07 NOTE — Telephone Encounter (Signed)
Spoke to representative from Dr Florentina Addison office who said to fax OV to her and she would get to appropriate office for fistula placement.

## 2018-08-12 ENCOUNTER — Encounter: Payer: Medicare HMO | Admitting: *Deleted

## 2018-08-12 NOTE — Telephone Encounter (Signed)
Faxed Dr Hochrein's last office visit note that includes clearance for fistula placement to Dr Lowanda Foster

## 2018-08-13 ENCOUNTER — Other Ambulatory Visit: Payer: Self-pay | Admitting: Family Medicine

## 2018-08-16 ENCOUNTER — Encounter: Payer: Self-pay | Admitting: Family Medicine

## 2018-08-16 ENCOUNTER — Ambulatory Visit (INDEPENDENT_AMBULATORY_CARE_PROVIDER_SITE_OTHER): Payer: Medicare HMO | Admitting: Family Medicine

## 2018-08-16 VITALS — BP 140/87 | HR 96 | Temp 96.7°F | Ht 69.0 in | Wt 146.8 lb

## 2018-08-16 DIAGNOSIS — F339 Major depressive disorder, recurrent, unspecified: Secondary | ICD-10-CM

## 2018-08-16 DIAGNOSIS — Z23 Encounter for immunization: Secondary | ICD-10-CM | POA: Diagnosis not present

## 2018-08-16 DIAGNOSIS — E785 Hyperlipidemia, unspecified: Secondary | ICD-10-CM | POA: Diagnosis not present

## 2018-08-16 DIAGNOSIS — R7303 Prediabetes: Secondary | ICD-10-CM

## 2018-08-16 DIAGNOSIS — I4891 Unspecified atrial fibrillation: Secondary | ICD-10-CM

## 2018-08-16 DIAGNOSIS — I10 Essential (primary) hypertension: Secondary | ICD-10-CM

## 2018-08-16 DIAGNOSIS — N183 Chronic kidney disease, stage 3 unspecified: Secondary | ICD-10-CM

## 2018-08-16 DIAGNOSIS — J439 Emphysema, unspecified: Secondary | ICD-10-CM | POA: Diagnosis not present

## 2018-08-16 MED ORDER — DILTIAZEM HCL ER COATED BEADS 240 MG PO CP24: ORAL_CAPSULE | ORAL | 3 refills | Status: DC

## 2018-08-16 MED ORDER — POLYSACCHARIDE IRON COMPLEX 150 MG PO CAPS: 150.0000 mg | ORAL_CAPSULE | Freq: Every day | ORAL | 3 refills | Status: DC

## 2018-08-16 MED ORDER — TERAZOSIN HCL 2 MG PO CAPS: 2.0000 mg | ORAL_CAPSULE | Freq: Every day | ORAL | 3 refills | Status: DC

## 2018-08-16 MED ORDER — APIXABAN 5 MG PO TABS: 5.0000 mg | ORAL_TABLET | Freq: Two times a day (BID) | ORAL | 5 refills | Status: DC

## 2018-08-16 MED ORDER — ATORVASTATIN CALCIUM 20 MG PO TABS: 20.0000 mg | ORAL_TABLET | Freq: Every day | ORAL | 3 refills | Status: DC

## 2018-08-16 MED ORDER — CALCITRIOL 0.25 MCG PO CAPS: ORAL_CAPSULE | ORAL | 3 refills | Status: DC

## 2018-08-16 MED ORDER — CLONAZEPAM 1 MG PO TABS: ORAL_TABLET | ORAL | 2 refills | Status: DC

## 2018-08-16 NOTE — Progress Notes (Signed)
BP 140/87   Pulse 96   Temp (!) 96.7 F (35.9 C) (Oral)   Ht '5\' 9"'$  (1.753 m)   Wt 146 lb 12.8 oz (66.6 kg)   BMI 21.68 kg/m    Subjective:    Patient ID: Don [name], male    DOB: 03-14-1941, 77 y.o.   MRN: [id number]  HPI: Don [name] is a 77 y.o. male presenting on 08/16/2018 for Hyperlipidemia (Check up on chronic medical conditons ) and Hypertension   HPI Prediabetes Patient comes in today for recheck of his prediabetes. Patient has been currently taking no medication is been diet controlled, his last glucose was 90. Patient is not currently on an ACE inhibitor/ARB because of CKD and worsening renal function. Patient has not seen an ophthalmologist this year. Patient denies any issues with their feet.   Hypertension Patient is currently on bisoprolol and diltiazem and terazosin, and their blood pressure today is 140/87. Patient denies any lightheadedness or dizziness. Patient denies headaches, blurred vision, chest pains, shortness of breath, or weakness. Denies any side effects from medication and is content with current medication.   Hyperlipidemia Patient is coming in for recheck of his hyperlipidemia. The patient is currently taking Lipitor. They deny any issues with myalgias or history of liver damage from it. They deny any focal numbness or weakness or chest pain.   COPD Patient is coming in for COPD recheck today.  He is currently on Symbicort.  He has a mild chronic cough but denies any major coughing spells or wheezing spells.  He has 0nighttime symptoms per week and 0daytime symptoms per week currently.   Depression recheck Patient is coming in for depression recheck as well.  Patient currently takes clonazepam for his anxiety and depression and has been stable on it for some time and his family who is here with him says that it is only when needed to help calm his nerves.  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  A. fib recheck Patient is coming in for A. fib recheck.  He denies any palpitations or bleeding episodes.  He denies any shortness of breath or wheezing or chest pain.  Patient currently takes Eliquis for blood thinning  Relevant past medical, surgical, family and social history reviewed and updated as indicated. Interim medical history since our last visit reviewed. Allergies and medications reviewed and updated.  Review of Systems  Constitutional: Negative for chills and fever.  Eyes: Negative for visual disturbance.  Respiratory: Positive for cough. Negative for shortness of breath and wheezing.   Cardiovascular: Negative for chest pain and leg swelling.  Musculoskeletal: Negative for back pain and gait problem.  Skin: Negative for rash.  Neurological: Negative for dizziness, weakness, light-headedness and numbness.  Psychiatric/Behavioral: Negative for decreased concentration, dysphoric mood, self-injury, sleep disturbance and suicidal ideas. The patient is not nervous/anxious.   All other systems reviewed and are negative.   Per HPI unless specifically indicated above   Allergies as of 08/16/2018      Reactions   Wellbutrin [bupropion] Anxiety      Medication List        Accurate as of 08/16/18  9:24 AM. Always use your most recent med list.          acetaminophen 500 MG tablet Commonly known as:  TYLENOL Take 500 mg by mouth every 6 (six) hours as needed.  apixaban 5 MG Tabs tablet Commonly known as:  ELIQUIS Take 1 tablet (5 mg total) by mouth 2 (two) times daily. (Needs to be seen)   atorvastatin 20 MG tablet Commonly known as:  LIPITOR Take 1 tablet (20 mg total) by mouth daily. (Needs to be seen)   bisoprolol 10 MG tablet Commonly known as:  ZEBETA Take 1 tablet (10 mg total) by mouth daily.   budesonide-formoterol 160-4.5 MCG/ACT inhaler Commonly known as:  SYMBICORT Inhale 2 puffs into the lungs 2 (two) times  daily.   calcitRIOL 0.25 MCG capsule Commonly known as:  ROCALTROL TAKE (1) CAPSULE DAILY   clonazePAM 1 MG tablet Commonly known as:  KLONOPIN TAKE 1/2 TABLET ONCE DAILY AS NEEDED FOR ANXIETY   diltiazem 240 MG 24 hr capsule Commonly known as:  CARDIZEM CD TAKE (1) CAPSULE DAILY   iron polysaccharides 150 MG capsule Commonly known as:  NIFEREX Take 1 capsule (150 mg total) by mouth daily. (Needs to be seen)   loratadine 10 MG tablet Commonly known as:  CLARITIN Take 1 tablet (10 mg total) by mouth daily. (Needs to be seen)   Melatonin 5 MG Tabs Take 5 mg by mouth at bedtime.   pantoprazole 40 MG tablet Commonly known as:  PROTONIX Take 40 mg by mouth 2 (two) times daily.   PRESERVISION AREDS 2 PO Take by mouth 2 (two) times daily.   PROAIR HFA IN Inhale into the lungs 2 (two) times daily.   terazosin 2 MG capsule Commonly known as:  HYTRIN TAKE 1 CAPSULE AT BEDTIME   terazosin 2 MG capsule Commonly known as:  HYTRIN Take 1 capsule (2 mg total) by mouth at bedtime. (Needs to be seen)          Objective:    BP 140/87   Pulse 96   Temp (!) 96.7 F (35.9 C) (Oral)   Ht '5\' 9"'$  (1.753 m)   Wt 146 lb 12.8 oz (66.6 kg)   BMI 21.68 kg/m   Wt Readings from Last 3 Encounters:  08/16/18 146 lb 12.8 oz (66.6 kg)  07/31/18 153 lb (69.4 kg)  07/10/18 154 lb (69.9 kg)    Physical Exam  Constitutional: He is oriented to person, place, and time. He appears well-developed and well-nourished. No distress.  Eyes: Conjunctivae are normal. No scleral icterus.  Neck: Neck supple. No thyromegaly present.  Cardiovascular: Normal rate, normal heart sounds and intact distal pulses. An irregularly irregular rhythm present.  No murmur heard. Pulmonary/Chest: Effort normal and breath sounds normal. No respiratory distress. He has no wheezes. He has no rales.  Musculoskeletal: Normal range of motion. He exhibits no edema.  Lymphadenopathy:    He has no cervical adenopathy.    Neurological: He is alert and oriented to person, place, and time. Coordination normal.  Skin: Skin is warm and dry. No rash noted. He is not diaphoretic.  Psychiatric: He has a normal mood and affect. His behavior is normal. His mood appears not anxious. He does not exhibit a depressed mood. He expresses no suicidal ideation. He expresses no suicidal plans.  Nursing note and vitals reviewed.       Assessment & Plan:   Problem List Items Addressed This Visit      Cardiovascular and Mediastinum   Hypertension   Relevant Medications   apixaban (ELIQUIS) 5 MG TABS tablet   atorvastatin (LIPITOR) 20 MG tablet   diltiazem (CARDIZEM CD) 240 MG 24 hr capsule   terazosin (HYTRIN) 2 MG  capsule   Atrial fibrillation with RVR (HCC)   Relevant Medications   apixaban (ELIQUIS) 5 MG TABS tablet   atorvastatin (LIPITOR) 20 MG tablet   diltiazem (CARDIZEM CD) 240 MG 24 hr capsule   terazosin (HYTRIN) 2 MG capsule   Other Relevant Orders   CBC with Differential/Platelet     Respiratory   COPD (chronic obstructive pulmonary disease) (HCC)   Relevant Orders   CBC with Differential/Platelet     Genitourinary   Stage III chronic kidney disease (HCC)   Relevant Orders   BMP8+EGFR     Other   Hyperlipidemia LDL goal <130   Relevant Medications   apixaban (ELIQUIS) 5 MG TABS tablet   atorvastatin (LIPITOR) 20 MG tablet   diltiazem (CARDIZEM CD) 240 MG 24 hr capsule   terazosin (HYTRIN) 2 MG capsule   Other Relevant Orders   Lipid panel   Pre-diabetes - Primary   Relevant Orders   Bayer DCA Hb A1c Waived   Depression, recurrent ([place])    Other Visit Diagnoses    Encounter for immunization       Relevant Orders   Flu vaccine HIGH DOSE PF (Completed)       Follow up plan: Return in about 3 months (around 11/16/2018), or if symptoms worsen or fail to improve, for Prediabetes and hypertension and COPD.  Counseling provided for all of the vaccine components Orders Placed This  Encounter  Procedures  . CBC with Differential/Platelet  . BMP8+EGFR  . Lipid panel  . Bayer [facility] Hb A1c Waived    Caryl Pina, MD [facility] 08/16/2018, 9:24 AM

## 2018-08-23 ENCOUNTER — Encounter: Payer: Self-pay | Admitting: Cardiology

## 2018-08-27 ENCOUNTER — Other Ambulatory Visit: Payer: Self-pay | Admitting: Family Medicine

## 2018-09-09 DIAGNOSIS — I509 Heart failure, unspecified: Secondary | ICD-10-CM | POA: Diagnosis not present

## 2018-09-09 DIAGNOSIS — D649 Anemia, unspecified: Secondary | ICD-10-CM | POA: Diagnosis not present

## 2018-09-09 DIAGNOSIS — N185 Chronic kidney disease, stage 5: Secondary | ICD-10-CM | POA: Diagnosis not present

## 2018-09-09 DIAGNOSIS — I10 Essential (primary) hypertension: Secondary | ICD-10-CM | POA: Diagnosis not present

## 2018-09-11 ENCOUNTER — Other Ambulatory Visit: Payer: Self-pay | Admitting: Family Medicine

## 2018-09-11 NOTE — Progress Notes (Signed)
Cardiology Office Note   Date:  09/13/2018   ID:  [name], Don [name] 1941/03/06, MRN [id number]  PCP:  Dettinger, Fransisca Kaufmann, MD  Cardiologist:   No primary care provider on file.   Chief Complaint  Patient presents with  . Atrial Fibrillation      History of Present Illness: Don [name] is a 77 y.o. male who presents for evaluation of atrial fib.   He was seen in 2015 and he had atrial fibrillation and rapid rate and an elevated troponin.  Initially he did not want a stress test but he eventually did have one in December 2015 and there was no evidence of ischemia.  He had suggestion of a low ejection fraction but a follow-up echo done in February 2018 demonstrated the ejection fraction to be normal.   I saw him in August prior to an dialysis fistula placement.     Unfortunately it does not seem like he was scheduled to see the vascular surgeon since so that has not happened yet.  There seems to be some confusion although I did call his primary care office and we investigated and found out that a referral has been placed to vascular surgery.  The patient has been back to the [place].  I do see that he has had some blood work to follow-up anemia for which she required a transfusion after a fall in May.  His blood count is up to 10.4.  He has had no new cardiovascular complaints.  He walks with a cane.  He denies any cardiovascular symptoms such as pressure, neck or arm discomfort.  He is not noticing any palpitations, presyncope or syncope.   Past Medical History:  Diagnosis Date  . Acute bronchitis 04/03/2014  . Anxiety   . Atrial fibrillation ([place])   . Cataract   . COPD (chronic obstructive pulmonary disease) ([place])   . Essential hypertension   . Hyperlipidemia   . Macular degeneration   . Noncompliance with medications 04/2013   Xarelto, digoxin previously  . Pre-diabetes   . Prostate cancer ([place])   . Stage III chronic kidney disease 04/03/2014  . Tubular adenoma of colon  07/31/02, 11/18/03    Past Surgical History:  Procedure Laterality Date  . Anal abcess,Hemorroids,    . COLONOSCOPY N/A 04/05/2014   Dr. Fuller Plan: 6 mm sessile polyp from sigmoid colon (tubular adenoma), internal hemorrhoids  . COLONOSCOPY W/ BIOPSIES  11/18/2003   Dr. Earle Gell  . ESOPHAGOGASTRODUODENOSCOPY  11/18/2003   Dr. Earle Gell  . ESOPHAGOGASTRODUODENOSCOPY N/A 04/05/2014   Dr. Fuller Plan: variable Z line, negative Barrett's, small hiatal hernia  . ESOPHAGOGASTRODUODENOSCOPY N/A 02/02/2017   Dr. Oneida Alar: LA Grade B esophagitis, one moderate benign-appearing intrinsic stenosis traversed, small non-bleeding diverticulum in second portion of duodenum, reactive gastropathy, no H.pylori.  . ESOPHAGOGASTRODUODENOSCOPY N/A 03/22/2018   Procedure: ESOPHAGOGASTRODUODENOSCOPY (EGD);  Surgeon: Daneil Dolin, MD;  Location: [facility] ENDO SUITE;  Service: Endoscopy;  Laterality: N/A;  . GIVENS CAPSULE STUDY N/A 03/22/2018   Procedure: GIVENS CAPSULE STUDY;  Surgeon: Daneil Dolin, MD;  Location: [facility] ENDO SUITE;  Service: Endoscopy;  Laterality: N/A;  . RETROPUBIC PROSTATECTOMY  11/26/2001  . TONSILLECTOMY       Current Outpatient Medications  Medication Sig Dispense Refill  . acetaminophen (TYLENOL) 500 MG tablet Take 500 mg by mouth every 6 (six) hours as needed.    . Albuterol Sulfate (PROAIR HFA IN) Inhale into the lungs 2 (two) times daily.    [name]  apixaban (ELIQUIS) 5 MG TABS tablet Take 1 tablet (5 mg total) by mouth 2 (two) times daily. (Needs to be seen) 60 tablet 5  . atorvastatin (LIPITOR) 20 MG tablet Take 1 tablet (20 mg total) by mouth daily. (Needs to be seen) 90 tablet 3  . bisoprolol (ZEBETA) 10 MG tablet Take 1 tablet (10 mg total) by mouth daily. 30 tablet 1  . budesonide-formoterol (SYMBICORT) 160-4.5 MCG/ACT inhaler Inhale 2 puffs into the lungs 2 (two) times daily.    . calcitRIOL (ROCALTROL) 0.25 MCG capsule TAKE (1) CAPSULE DAILY 90 capsule 3  . clonazePAM (KLONOPIN) 1 MG  tablet TAKE 1/2 TABLET ONCE DAILY AS NEEDED FOR ANXIETY 15 tablet 2  . diltiazem (CARDIZEM CD) 240 MG 24 hr capsule TAKE (1) CAPSULE DAILY 90 capsule 3  . iron polysaccharides (FERREX 150) 150 MG capsule Take 1 capsule (150 mg total) by mouth daily. (Needs to be seen) 90 capsule 3  . loratadine (CLARITIN) 10 MG tablet TAKE 1 TABLET DAILY 30 tablet 0  . Melatonin 5 MG TABS Take 5 mg by mouth at bedtime.    . Multiple Vitamins-Minerals (PRESERVISION AREDS 2 PO) Take by mouth 2 (two) times daily.    . pantoprazole (PROTONIX) 40 MG tablet TAKE (1) TABLET TWICE A DAY. 180 tablet 1  . terazosin (HYTRIN) 2 MG capsule TAKE 1 CAPSULE AT BEDTIME 90 capsule 0   No current facility-administered medications for this visit.     Allergies:   Wellbutrin [bupropion]   ROS:  Please see the history of present illness.   Otherwise, review of systems are positive for none.   All other systems are reviewed and negative.    PHYSICAL EXAM: VS:  BP (!) 118/50   Pulse 93   Ht 5' 9.5" (1.765 m)   Wt 150 lb 9.6 oz (68.3 kg)   SpO2 96%   BMI 21.92 kg/m  , BMI Body mass index is 21.92 kg/m. GENERAL:  Well appearing NECK:  No jugular venous distention, waveform within normal limits, carotid upstroke brisk and symmetric, no bruits, no thyromegaly LUNGS:  Clear to auscultation bilaterally CHEST:  Unremarkable HEART:  PMI not displaced or sustained,S1 and S2 within normal limits, no S3, no clicks, no rubs, no murmurs, irregular ABD:  Flat, positive bowel sounds normal in frequency in pitch, no bruits, no rebound, no guarding, no midline pulsatile mass, no hepatomegaly, no splenomegaly EXT:  2 plus pulses throughout, no edema, no cyanosis no clubbing.   EKG:  EKG is not  ordered today.   Recent Labs: 03/18/2018: ALT 12; TSH 3.173 03/27/2018: BUN 35; Creatinine, Ser 2.97; Hemoglobin 8.7; Platelets 262; Potassium 3.8; Sodium 139    Lipid Panel    Component Value Date/Time   CHOL 107 03/01/2017 1110   TRIG  126 03/01/2017 1110   TRIG 176 (H) 10/16/2013 1318   HDL 27 (L) 03/01/2017 1110   HDL 35 (L) 10/16/2013 1318   CHOLHDL 4.0 03/01/2017 1110   LDLCALC 55 03/01/2017 1110   LDLCALC 90 10/16/2013 1318      Wt Readings from Last 3 Encounters:  09/13/18 150 lb 9.6 oz (68.3 kg)  08/16/18 146 lb 12.8 oz (66.6 kg)  07/31/18 153 lb (69.4 kg)      Other studies Reviewed: Additional studies/ records that were reviewed today include[facility] Review of the above records demonstrates:       ASSESSMENT AND PLAN:   Atrial fibrillation with RVR   Mr. DERAN BARRO has  a CHA2DS2 - VASc score 3.    Tolerates anticoagulation has had good rate control.  No change in therapy.  Elevated troponin / demand ischemia/abnormal echo He had a negative perfusion study in 2015.   He has no active symptoms.  No change in therapy is indicated.  Hyperlipidemia  [facility] is [facility] the [facility]  Hypertension  The blood pressure is at target.  No change in therapy.   Tobacco abuse  We have discussed the need to stop smoking.   Stage III CKD (chronic kidney disease)  He is seen by nephrology.   Preop The patient is at acceptable risk for the planned procedure.   No further cardiovascular testing is suggested.  Current medicines are reviewed at length with the patient today.  The patient does not have concerns regarding medicines.  The following changes have been made:   None  Labs/ tests ordered today include:  None  No orders of the defined types were placed in this encounter.    Disposition:   FU with me in one year.    Signed, Minus Breeding, MD  09/13/2018 10:18 AM    [place]

## 2018-09-13 ENCOUNTER — Encounter: Payer: Self-pay | Admitting: Cardiology

## 2018-09-13 ENCOUNTER — Other Ambulatory Visit: Payer: Self-pay

## 2018-09-13 ENCOUNTER — Ambulatory Visit (INDEPENDENT_AMBULATORY_CARE_PROVIDER_SITE_OTHER): Payer: No Typology Code available for payment source | Admitting: Cardiology

## 2018-09-13 ENCOUNTER — Telehealth: Payer: Self-pay | Admitting: Family Medicine

## 2018-09-13 VITALS — BP 118/50 | HR 93 | Ht 69.5 in | Wt 150.6 lb

## 2018-09-13 DIAGNOSIS — I482 Chronic atrial fibrillation, unspecified: Secondary | ICD-10-CM | POA: Diagnosis not present

## 2018-09-13 DIAGNOSIS — I10 Essential (primary) hypertension: Secondary | ICD-10-CM | POA: Diagnosis not present

## 2018-09-13 DIAGNOSIS — N183 Chronic kidney disease, stage 3 unspecified: Secondary | ICD-10-CM

## 2018-09-13 NOTE — Telephone Encounter (Signed)
PT wants to talk to nurse about side effects of kidney dialysis going to start it at [facility]

## 2018-09-13 NOTE — Patient Instructions (Signed)
Medication Instructions:  Continue current medications  If you need a refill on your cardiac medications before your next appointment, please call your pharmacy.  Labwork: None Ordered  If you have labs (blood work) drawn today and your tests are completely normal, you will receive your results only by: [name] MyChart Message (if you have MyChart) OR . A paper copy in the mail If you have any lab test that is abnormal or we need to change your treatment, we will call you to review the results.  Testing/Procedures: None Ordered  Special Instructions: On Monday November 18th at [facility] for Ultrasound At 11:30 Dr Early Office [place]  Follow-Up: You will need a follow up appointment in 1 Year.  Please call our office 2 months in advance([phone]) to schedule the (1 Year) appointment.  You may see  DR Percival Spanish or one of the following Advanced Practice Providers on your designated Care Team:   . Jory Sims, DNP, ANP . Rhonda Barrett, PA-C .  [name] Kerin Ransom, PA-C . Daleen Snook Kroeger, PA-C . Sande Rives, PA-C .  [name] Almyra Deforest, PA-C . Fabian Sharp, PA-C  At [facility], you and your health needs are our priority.  As part of our continuing mission to provide you with exceptional heart care, we have created designated Provider Care Teams.  These Care Teams include your primary Cardiologist (physician) and Advanced Practice Providers (APPs -  Physician Assistants and Nurse Practitioners) who all work together to provide you with the care you need, when you need it.   Thank you for choosing [facility] at [facility]!!

## 2018-09-13 NOTE — Telephone Encounter (Signed)
lmtcb    Per Dettinger - only side effects are site reaction and dizziness. Also dialysis will keep a check on his electro lights.

## 2018-09-13 NOTE — Telephone Encounter (Signed)
Patient aware and verbalizes understanding. 

## 2018-09-17 ENCOUNTER — Telehealth: Payer: Self-pay

## 2018-09-17 NOTE — Telephone Encounter (Signed)
Spoke with a Nurse from [place] who states she will be faxing over medical release form to get pt OV note from 07/10/18. Fax number provided

## 2018-09-18 ENCOUNTER — Other Ambulatory Visit: Payer: Self-pay | Admitting: Family Medicine

## 2018-09-30 ENCOUNTER — Ambulatory Visit (HOSPITAL_COMMUNITY)
Admission: RE | Admit: 2018-09-30 | Discharge: 2018-09-30 | Disposition: A | Discharge status: Home / Self Care | Payer: No Typology Code available for payment source | Source: Ambulatory Visit

## 2018-09-30 ENCOUNTER — Telehealth: Payer: Self-pay | Admitting: *Deleted

## 2018-09-30 ENCOUNTER — Ambulatory Visit (INDEPENDENT_AMBULATORY_CARE_PROVIDER_SITE_OTHER): Payer: No Typology Code available for payment source | Admitting: Vascular Surgery

## 2018-09-30 ENCOUNTER — Other Ambulatory Visit: Payer: Self-pay | Admitting: *Deleted

## 2018-09-30 ENCOUNTER — Encounter: Payer: Self-pay | Admitting: Vascular Surgery

## 2018-09-30 ENCOUNTER — Ambulatory Visit (HOSPITAL_COMMUNITY)
Admission: RE | Admit: 2018-09-30 | Discharge: 2018-09-30 | Disposition: A | Discharge status: Home / Self Care | Payer: No Typology Code available for payment source | Source: Ambulatory Visit | Attending: Vascular Surgery | Admitting: Vascular Surgery

## 2018-09-30 VITALS — BP 128/82 | HR 91 | Temp 96.4°F | Resp 18 | Wt 151.8 lb

## 2018-09-30 DIAGNOSIS — N183 Chronic kidney disease, stage 3 unspecified: Secondary | ICD-10-CM

## 2018-09-30 NOTE — Progress Notes (Signed)
[facility]ist of [place]  Patient name: Don [name] MRN: [id number] DOB: 19-Oct-1941 Sex: male  REASON FOR CONSULT: Discuss access for hemodialysis  Seen today in our [place] office  HPI: Don [name] is a 77 y.o. male, who is here today for discussion of hemodialysis access.  He is here with his son.  He has had progressive renal insufficiency and is approaching need for hemodialysis.  He has never had hemodialysis access.  Does not have a pacemaker.  He is right-handed but reports he writes with his left hand.  He would prefer left arm access.  He is on chronic anticoagulation due to atrial fibrillation  Past Medical History:  Diagnosis Date  . Acute bronchitis 04/03/2014  . Anxiety   . Atrial fibrillation ([place])   . Cataract   . COPD (chronic obstructive pulmonary disease) ([place])   . Essential hypertension   . Hyperlipidemia   . Macular degeneration   . Noncompliance with medications 04/2013   Xarelto, digoxin previously  . Pre-diabetes   . Prostate cancer ([place])   . Stage III chronic kidney disease 04/03/2014  . Tubular adenoma of colon 07/31/02, 11/18/03    Family History  Problem Relation Age of Onset  . Diabetes Father   . Heart disease Father 62       MI  . Heart attack Father   . Heart attack Mother   . Heart disease Brother   . Cancer Brother        lung  . Lung cancer Brother   . Heart disease Brother   . Lung cancer Sister   . Cancer Sister        lung  . Heart disease Brother   . Heart disease Brother     SOCIAL HISTORY: Social History   Socioeconomic History  . Marital status: Widowed    Spouse name: Not on file  . Number of children: 0  . Years of education: Not on file  . Highest education level: Not on file  Occupational History  . Occupation: retired  Scientific laboratory technician  . Financial resource strain: Not on file  . Food insecurity:    Worry: Not on file    Inability: Not on file  .  Transportation needs:    Medical: Not on file    Non-medical: Not on file  Tobacco Use  . Smoking status: Current Some Day Smoker    Packs/day: 0.50    Years: 62.00    Pack years: 31.00    Types: Cigarettes, Cigars    Last attempt to quit: 12/28/2016    Years since quitting: 1.7  . Smokeless tobacco: Never Used  Substance and Sexual Activity  . Alcohol use: No  . Drug use: No  . Sexual activity: Never  Lifestyle  . Physical activity:    Days per week: 0 days    Minutes per session: 0 min  . Stress: Not at all  Relationships  . Social connections:    Talks on phone: Never    Gets together: Once a week    Attends religious service: Never    Active member of club or organization: No    Attends meetings of clubs or organizations: Never    Relationship status: Widowed  . Intimate partner violence:    Fear of current or ex partner: Not on file    Emotionally abused: Not on file    Physically abused: Not on file    Forced sexual activity: Not  on file  Other Topics Concern  . 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      Allergies  Allergen Reactions  . Wellbutrin [Bupropion] Anxiety    Current Outpatient Medications  Medication Sig Dispense Refill  . acetaminophen (TYLENOL) 500 MG tablet Take 500 mg by mouth every 6 (six) hours as needed.    . Albuterol Sulfate (PROAIR HFA IN) Inhale into the lungs 2 (two) times daily.    [name] apixaban (ELIQUIS) 5 MG TABS tablet Take 1 tablet (5 mg total) by mouth 2 (two) times daily. (Needs to be seen) 60 tablet 5  . atorvastatin (LIPITOR) 20 MG tablet Take 1 tablet (20 mg total) by mouth daily. (Needs to be seen) 90 tablet 3  . bisoprolol (ZEBETA) 10 MG tablet Take 1 tablet (10 mg total) by mouth daily. 30 tablet 1  . budesonide-formoterol (SYMBICORT) 160-4.5 MCG/ACT inhaler Inhale 2 puffs into the lungs 2 (two) times daily.    . calcitRIOL (ROCALTROL) 0.25 MCG  capsule TAKE (1) CAPSULE DAILY 90 capsule 3  . clonazePAM (KLONOPIN) 1 MG tablet TAKE 1/2 TABLET ONCE DAILY AS NEEDED FOR ANXIETY 15 tablet 2  . diltiazem (CARDIZEM CD) 240 MG 24 hr capsule TAKE (1) CAPSULE DAILY 90 capsule 3  . iron polysaccharides (FERREX 150) 150 MG capsule Take 1 capsule (150 mg total) by mouth daily. (Needs to be seen) 90 capsule 3  . loratadine (CLARITIN) 10 MG tablet TAKE 1 TABLET DAILY 30 tablet 2  . Melatonin 5 MG TABS Take 5 mg by mouth at bedtime.    . Multiple Vitamins-Minerals (PRESERVISION AREDS 2 PO) Take by mouth 2 (two) times daily.    . pantoprazole (PROTONIX) 40 MG tablet TAKE (1) TABLET TWICE A DAY. 180 tablet 1  . terazosin (HYTRIN) 2 MG capsule TAKE 1 CAPSULE AT BEDTIME 90 capsule 0   No current facility-administered medications for this visit.     REVIEW OF SYSTEMS:  [X]  denotes positive finding, [ ]  denotes negative finding Cardiac  Comments:  Chest pain or chest pressure:    Shortness of breath upon exertion: x   Short of breath when lying flat:    Irregular heart rhythm: x       Vascular    Pain in calf, thigh, or hip brought on by ambulation:    Pain in feet at night that wakes you up from your sleep:     Blood clot in your veins:    Leg swelling:  x       Pulmonary    Oxygen at home:    Productive cough:  x   Wheezing:  x       Neurologic    Sudden weakness in arms or legs:     Sudden numbness in arms or legs:  x   Sudden onset of difficulty speaking or slurred speech:    Temporary loss of vision in one eye:     Problems with dizziness:  x       Gastrointestinal    Blood in stool:     Vomited blood:         Genitourinary    Burning when urinating:     Blood in urine:        Psychiatric    Major depression:         Hematologic    Bleeding problems:    Problems with blood  clotting too easily:        Skin    Rashes or ulcers: x       Constitutional    Fever or chills:      PHYSICAL EXAM: Vitals:   09/30/18 1143    BP: 128/82  Pulse: 91  Resp: 18  Temp: (!) 96.4 F (35.8 C)  TempSrc: Temporal  Weight: 151 lb 12.8 oz (68.9 kg)    GENERAL: The patient is a well-nourished male, in no acute distress. The vital signs are documented above. CARDIOVASCULAR: 2+ radial pulses bilaterally.  Has small surface veins by physical exam bilaterally PULMONARY: There is good air exchange  ABDOMEN: Soft and non-tender  MUSCULOSKELETAL: There are no major deformities or cyanosis. NEUROLOGIC: No focal weakness or paresthesias are detected. SKIN: Diffuse superficial rash over both upper extremities PSYCHIATRIC: The patient has a normal affect.  DATA:  Noninvasive studies from [facility] revealed normal arterial flow bilaterally upper extremities.  His cephalic vein are small throughout this course in the forearm and upper arm.  He does have moderate sized basilic veins above the elbow bilaterally  MEDICAL ISSUES: Had a long discussion with the patient and his son.  Have recommended left arm first stage basilic vein fistula.  Explained this would be done as an outpatient at [facility] that he would be seen as an outpatient in 4 to 6 weeks and that if he was having good maturation would proceed with outpatient second stage transposition.  Discussed options for catheter for immediate need for dialysis and also for graft placement if his fistula fails.  We will proceed with this at his earliest [place]. Early, MD [facility]ists of [facility] Tel [phone] Pager [phone]

## 2018-09-30 NOTE — Telephone Encounter (Signed)
Spoke with [employer] caregiver. Instructed to be at [facility] admitting at 7 am on 10/08/2018 for surgery. Hold Eliquis for 2 days prior to surgery.  NPO past MN night prior and must have a driver and caregiver for discharge to home. Expect a phone call and follow the detailed instructions for this surgery received from the hospital pre-admission department. Verbalized understanding.

## 2018-09-30 NOTE — H&P (View-Only) (Signed)
Vascular and Vein Specialist of [place]  Patient name: Don [name] MRN: [id number] DOB: Aug 05, 1941 Sex: male  REASON FOR CONSULT: Discuss access for hemodialysis  Seen today in our [place] office  HPI: Don [name] is a 77 y.o. male, who is here today for discussion of hemodialysis access.  He is here with his son.  He has had progressive renal insufficiency and is approaching need for hemodialysis.  He has never had hemodialysis access.  Does not have a pacemaker.  He is right-handed but reports he writes with his left hand.  He would prefer left arm access.  He is on chronic anticoagulation due to atrial fibrillation  Past Medical History:  Diagnosis Date  . Acute bronchitis 04/03/2014  . Anxiety   . Atrial fibrillation ([place])   . Cataract   . COPD (chronic obstructive pulmonary disease) ([place])   . Essential hypertension   . Hyperlipidemia   . Macular degeneration   . Noncompliance with medications 04/2013   Xarelto, digoxin previously  . Pre-diabetes   . Prostate cancer ([place])   . Stage III chronic kidney disease 04/03/2014  . Tubular adenoma of colon 07/31/02, 11/18/03    Family History  Problem Relation Age of Onset  . Diabetes Father   . Heart disease Father 57       MI  . Heart attack Father   . Heart attack Mother   . Heart disease Brother   . Cancer Brother        lung  . Lung cancer Brother   . Heart disease Brother   . Lung cancer Sister   . Cancer Sister        lung  . Heart disease Brother   . Heart disease Brother     SOCIAL HISTORY: Social History   Socioeconomic History  . Marital status: Widowed    Spouse name: Not on file  . Number of children: 0  . Years of education: Not on file  . Highest education level: Not on file  Occupational History  . Occupation: retired  Scientific laboratory technician  . Financial resource strain: Not on file  . Food insecurity:    Worry: Not on file    Inability: Not on file  .  Transportation needs:    Medical: Not on file    Non-medical: Not on file  Tobacco Use  . Smoking status: Current Some Day Smoker    Packs/day: 0.50    Years: 62.00    Pack years: 31.00    Types: Cigarettes, Cigars    Last attempt to quit: 12/28/2016    Years since quitting: 1.7  . Smokeless tobacco: Never Used  Substance and Sexual Activity  . Alcohol use: No  . Drug use: No  . Sexual activity: Never  Lifestyle  . Physical activity:    Days per week: 0 days    Minutes per session: 0 min  . Stress: Not at all  Relationships  . Social connections:    Talks on phone: Never    Gets together: Once a week    Attends religious service: Never    Active member of club or organization: No    Attends meetings of clubs or organizations: Never    Relationship status: Widowed  . Intimate partner violence:    Fear of current or ex partner: Not on file    Emotionally abused: Not on file    Physically abused: Not on file    Forced sexual activity: Not  on file  Other Topics Concern  . 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      Allergies  Allergen Reactions  . Wellbutrin [Bupropion] Anxiety    Current Outpatient Medications  Medication Sig Dispense Refill  . acetaminophen (TYLENOL) 500 MG tablet Take 500 mg by mouth every 6 (six) hours as needed.    . Albuterol Sulfate (PROAIR HFA IN) Inhale into the lungs 2 (two) times daily.    [name] apixaban (ELIQUIS) 5 MG TABS tablet Take 1 tablet (5 mg total) by mouth 2 (two) times daily. (Needs to be seen) 60 tablet 5  . atorvastatin (LIPITOR) 20 MG tablet Take 1 tablet (20 mg total) by mouth daily. (Needs to be seen) 90 tablet 3  . bisoprolol (ZEBETA) 10 MG tablet Take 1 tablet (10 mg total) by mouth daily. 30 tablet 1  . budesonide-formoterol (SYMBICORT) 160-4.5 MCG/ACT inhaler Inhale 2 puffs into the lungs 2 (two) times daily.    . calcitRIOL (ROCALTROL) 0.25 MCG  capsule TAKE (1) CAPSULE DAILY 90 capsule 3  . clonazePAM (KLONOPIN) 1 MG tablet TAKE 1/2 TABLET ONCE DAILY AS NEEDED FOR ANXIETY 15 tablet 2  . diltiazem (CARDIZEM CD) 240 MG 24 hr capsule TAKE (1) CAPSULE DAILY 90 capsule 3  . iron polysaccharides (FERREX 150) 150 MG capsule Take 1 capsule (150 mg total) by mouth daily. (Needs to be seen) 90 capsule 3  . loratadine (CLARITIN) 10 MG tablet TAKE 1 TABLET DAILY 30 tablet 2  . Melatonin 5 MG TABS Take 5 mg by mouth at bedtime.    . Multiple Vitamins-Minerals (PRESERVISION AREDS 2 PO) Take by mouth 2 (two) times daily.    . pantoprazole (PROTONIX) 40 MG tablet TAKE (1) TABLET TWICE A DAY. 180 tablet 1  . terazosin (HYTRIN) 2 MG capsule TAKE 1 CAPSULE AT BEDTIME 90 capsule 0   No current facility-administered medications for this visit.     REVIEW OF SYSTEMS:  [X]  denotes positive finding, [ ]  denotes negative finding Cardiac  Comments:  Chest pain or chest pressure:    Shortness of breath upon exertion: x   Short of breath when lying flat:    Irregular heart rhythm: x       Vascular    Pain in calf, thigh, or hip brought on by ambulation:    Pain in feet at night that wakes you up from your sleep:     Blood clot in your veins:    Leg swelling:  x       Pulmonary    Oxygen at home:    Productive cough:  x   Wheezing:  x       Neurologic    Sudden weakness in arms or legs:     Sudden numbness in arms or legs:  x   Sudden onset of difficulty speaking or slurred speech:    Temporary loss of vision in one eye:     Problems with dizziness:  x       Gastrointestinal    Blood in stool:     Vomited blood:         Genitourinary    Burning when urinating:     Blood in urine:        Psychiatric    Major depression:         Hematologic    Bleeding problems:    Problems with blood  clotting too easily:        Skin    Rashes or ulcers: x       Constitutional    Fever or chills:      PHYSICAL EXAM: Vitals:   09/30/18 1143    BP: 128/82  Pulse: 91  Resp: 18  Temp: (!) 96.4 F (35.8 C)  TempSrc: Temporal  Weight: 151 lb 12.8 oz (68.9 kg)    GENERAL: The patient is a well-nourished male, in no acute distress. The vital signs are documented above. CARDIOVASCULAR: 2+ radial pulses bilaterally.  Has small surface veins by physical exam bilaterally PULMONARY: There is good air exchange  ABDOMEN: Soft and non-tender  MUSCULOSKELETAL: There are no major deformities or cyanosis. NEUROLOGIC: No focal weakness or paresthesias are detected. SKIN: Diffuse superficial rash over both upper extremities PSYCHIATRIC: The patient has a normal affect.  DATA:  Noninvasive studies from [facility] revealed normal arterial flow bilaterally upper extremities.  His cephalic vein are small throughout this course in the forearm and upper arm.  He does have moderate sized basilic veins above the elbow bilaterally  MEDICAL ISSUES: Had a long discussion with the patient and his son.  Have recommended left arm first stage basilic vein fistula.  Explained this would be done as an outpatient at [facility] that he would be seen as an outpatient in 4 to 6 weeks and that if he was having good maturation would proceed with outpatient second stage transposition.  Discussed options for catheter for immediate need for dialysis and also for graft placement if his fistula fails.  We will proceed with this at his earliest [place]. Aubreigh Fuerte, MD [facility] Vascular and Vein Specialists of [facility] Tel [phone] Pager [phone]

## 2018-09-30 NOTE — Telephone Encounter (Signed)
Request for Surgical Clearance  1. What type of surgery is being performed?  FIRST STAGE BASILIC VEIN TRANSPOSITION    2. When is this surgery scheduled? 10/08/18  3. What type of clearance is required (medical clearance vs. Pharmacy clearance to hold med vs. Both)? PHARMACY CLEARANCE MEDICATION HOLD     4. Are there any medications that need to be held prior to surgery and how long?  ELIQUIS HOLD FOR 2 DAYS PRE-OP    5. Practice name and name of physician performing surgery?   DR. Scot Dock    6.  What is your office phone number? [phone]    7. What is your office fax number? (Be sure to include anyone who it needs to go Attn to) [phone] ATTN. [email or web address]    8. Anesthesia type (None, local, MAC, general)? MAC   REMINDER TO USER: Remember to please route this message to P CV DIV PREOP in a phone note.

## 2018-10-01 ENCOUNTER — Telehealth: Payer: Self-pay

## 2018-10-01 ENCOUNTER — Telehealth: Payer: Self-pay | Admitting: *Deleted

## 2018-10-01 NOTE — Telephone Encounter (Signed)
Addressed in separate clearance encounter 11/18. Pt will likely need to be transitioned from Eliquis to warfarin once he begins on HD.

## 2018-10-01 NOTE — Telephone Encounter (Signed)
Phone call to [facility] caregiver for patient. Per Supple RPH recommendations Eliquis hold for 3 days pre-op. Verbalized understanding.

## 2018-10-01 NOTE — Telephone Encounter (Signed)
Pt takes Eliquis for afib with CHADS2VASc score of 3 (age x2, HTN). SCr 2.97, CrCl is only 19.77mL/min however based on Eliquis dosing he does qualify for higher 5mg  BID dosing that he is currently taking until he turns 78 next year. Recommend holding Eliquis for 3 days prior to procedure to ensure appropriate clearance.

## 2018-10-01 NOTE — Telephone Encounter (Signed)
   [facility] Pre-operative Risk Assessment    Request for surgical clearance:  1. What type of surgery is being performed? First stage holistic vein transposition for HD access   2. When is this surgery scheduled? 10/08/18  3. What type of clearance is required (medical clearance vs. Pharmacy clearance to hold med vs. Both)? Both  4. Are there any medications that need to be held prior to surgery and how long? Eliquis  5. Practice name and name of physician performing surgery? [facility]  [place]   6. What is your office phone number [phone]   7.   What is your office fax number [phone]  8.   Anesthesia type Not listed   [name] 10/01/2018, 1:57 PM  _________________________________________________________________   (provider comments below)

## 2018-10-02 ENCOUNTER — Encounter: Payer: Self-pay | Admitting: Internal Medicine

## 2018-10-02 NOTE — Telephone Encounter (Signed)
I faxed pharmacy recommendations to [facility] at Dr. Mee Hives office.  Kerin Ransom PA-C 10/02/2018 3:34 PM

## 2018-10-07 ENCOUNTER — Other Ambulatory Visit: Payer: Self-pay

## 2018-10-07 ENCOUNTER — Encounter (HOSPITAL_COMMUNITY): Payer: Self-pay | Admitting: *Deleted

## 2018-10-07 NOTE — Progress Notes (Signed)
Spoke with pt's son, [name]. DPR on file. Pt has hx of A-fib and his cardiologist is Dr. Percival Spanish. He was instructed to stop Eliquis 3 days prior to surgery. Last dose per his son was 10/04/18. [name] states pt is not diabetic. [name] asked that I email him the list of pt's medicines to take in the AM which I did.

## 2018-10-08 ENCOUNTER — Ambulatory Visit (HOSPITAL_COMMUNITY): Payer: Medicare HMO | Admitting: Certified Registered Nurse Anesthetist

## 2018-10-08 ENCOUNTER — Encounter (HOSPITAL_COMMUNITY): Payer: Self-pay | Admitting: *Deleted

## 2018-10-08 ENCOUNTER — Ambulatory Visit (HOSPITAL_COMMUNITY)
Admission: RE | Admit: 2018-10-08 | Discharge: 2018-10-08 | Disposition: A | Discharge status: Home / Self Care | Payer: Medicare HMO | Attending: Vascular Surgery | Admitting: Vascular Surgery

## 2018-10-08 ENCOUNTER — Encounter (HOSPITAL_COMMUNITY)
Admission: RE | Disposition: A | Discharge status: Home / Self Care | Payer: Self-pay | Source: Home / Self Care | Attending: Vascular Surgery

## 2018-10-08 DIAGNOSIS — Z8249 Family history of ischemic heart disease and other diseases of the circulatory system: Secondary | ICD-10-CM | POA: Insufficient documentation

## 2018-10-08 DIAGNOSIS — E785 Hyperlipidemia, unspecified: Secondary | ICD-10-CM | POA: Insufficient documentation

## 2018-10-08 DIAGNOSIS — F17210 Nicotine dependence, cigarettes, uncomplicated: Secondary | ICD-10-CM | POA: Diagnosis not present

## 2018-10-08 DIAGNOSIS — D631 Anemia in chronic kidney disease: Secondary | ICD-10-CM | POA: Insufficient documentation

## 2018-10-08 DIAGNOSIS — I129 Hypertensive chronic kidney disease with stage 1 through stage 4 chronic kidney disease, or unspecified chronic kidney disease: Secondary | ICD-10-CM | POA: Diagnosis not present

## 2018-10-08 DIAGNOSIS — I4891 Unspecified atrial fibrillation: Secondary | ICD-10-CM | POA: Insufficient documentation

## 2018-10-08 DIAGNOSIS — N184 Chronic kidney disease, stage 4 (severe): Secondary | ICD-10-CM | POA: Insufficient documentation

## 2018-10-08 DIAGNOSIS — Z833 Family history of diabetes mellitus: Secondary | ICD-10-CM | POA: Insufficient documentation

## 2018-10-08 DIAGNOSIS — Z8546 Personal history of malignant neoplasm of prostate: Secondary | ICD-10-CM | POA: Insufficient documentation

## 2018-10-08 DIAGNOSIS — Z888 Allergy status to other drugs, medicaments and biological substances status: Secondary | ICD-10-CM | POA: Insufficient documentation

## 2018-10-08 DIAGNOSIS — R7303 Prediabetes: Secondary | ICD-10-CM | POA: Diagnosis not present

## 2018-10-08 DIAGNOSIS — J449 Chronic obstructive pulmonary disease, unspecified: Secondary | ICD-10-CM | POA: Insufficient documentation

## 2018-10-08 DIAGNOSIS — N183 Chronic kidney disease, stage 3 (moderate): Secondary | ICD-10-CM | POA: Diagnosis not present

## 2018-10-08 DIAGNOSIS — F419 Anxiety disorder, unspecified: Secondary | ICD-10-CM | POA: Diagnosis not present

## 2018-10-08 DIAGNOSIS — Z79899 Other long term (current) drug therapy: Secondary | ICD-10-CM | POA: Diagnosis not present

## 2018-10-08 DIAGNOSIS — Z7951 Long term (current) use of inhaled steroids: Secondary | ICD-10-CM | POA: Insufficient documentation

## 2018-10-08 DIAGNOSIS — Z7901 Long term (current) use of anticoagulants: Secondary | ICD-10-CM | POA: Diagnosis not present

## 2018-10-08 HISTORY — DX: Anemia, unspecified: D64.9

## 2018-10-08 HISTORY — DX: Major depressive disorder, single episode, unspecified: F32.9

## 2018-10-08 HISTORY — DX: Depression, unspecified: F32.A

## 2018-10-08 HISTORY — PX: BASCILIC VEIN TRANSPOSITION: SHX5742

## 2018-10-08 LAB — POCT I-STAT 4, (NA,K, GLUC, HGB,HCT)
GLUCOSE: 86 mg/dL (ref 70–99)
HEMATOCRIT: 27 % — AB (ref 39.0–52.0)
HEMOGLOBIN: 9.2 g/dL — AB (ref 13.0–17.0)
Potassium: 4.4 mmol/L (ref 3.5–5.1)
SODIUM: 146 mmol/L — AB (ref 135–145)

## 2018-10-08 LAB — PROTIME-INR
INR: 1.2
Prothrombin Time: 15.1 seconds (ref 11.4–15.2)

## 2018-10-08 SURGERY — TRANSPOSITION, VEIN, BASILIC
Anesthesia: Monitor Anesthesia Care | Laterality: Left

## 2018-10-08 MED ORDER — PROPOFOL 500 MG/50ML IV EMUL
INTRAVENOUS | Status: DC | PRN
  Administered 2018-10-08: 50 ug/kg/min via INTRAVENOUS

## 2018-10-08 MED ORDER — CHLORHEXIDINE GLUCONATE 4 % EX LIQD: 60.0000 mL | Freq: Once | CUTANEOUS | Status: DC

## 2018-10-08 MED ORDER — LIDOCAINE HCL (PF) 1 % IJ SOLN
INTRAMUSCULAR | Status: AC
  Filled 2018-10-08: qty 30

## 2018-10-08 MED ORDER — PROTAMINE SULFATE 10 MG/ML IV SOLN
INTRAVENOUS | Status: DC | PRN
  Administered 2018-10-08: 40 mg via INTRAVENOUS

## 2018-10-08 MED ORDER — CEFAZOLIN SODIUM-DEXTROSE 2-4 GM/100ML-% IV SOLN
2.0000 g | INTRAVENOUS | Status: AC
  Administered 2018-10-08: 2 g via INTRAVENOUS
  Filled 2018-10-08: qty 100

## 2018-10-08 MED ORDER — SODIUM CHLORIDE 0.9 % IV SOLN
INTRAVENOUS | Status: DC
  Administered 2018-10-08: 09:00:00 via INTRAVENOUS

## 2018-10-08 MED ORDER — 0.9 % SODIUM CHLORIDE (POUR BTL) OPTIME
TOPICAL | Status: DC | PRN
  Administered 2018-10-08: 1000 mL

## 2018-10-08 MED ORDER — LIDOCAINE HCL (PF) 1 % IJ SOLN
INTRAMUSCULAR | Status: DC | PRN
  Administered 2018-10-08: 7 mL

## 2018-10-08 MED ORDER — OXYCODONE HCL 5 MG PO TABS: 5.0000 mg | ORAL_TABLET | Freq: Four times a day (QID) | ORAL | 0 refills | Status: DC | PRN

## 2018-10-08 MED ORDER — LIDOCAINE HCL (CARDIAC) PF 100 MG/5ML IV SOSY
PREFILLED_SYRINGE | INTRAVENOUS | Status: DC | PRN
  Administered 2018-10-08: 40 mg via INTRAVENOUS

## 2018-10-08 MED ORDER — SODIUM CHLORIDE 0.9 % IV SOLN
INTRAVENOUS | Status: DC | PRN
  Administered 2018-10-08: 10:00:00

## 2018-10-08 MED ORDER — HEPARIN SODIUM (PORCINE) 1000 UNIT/ML IJ SOLN
INTRAMUSCULAR | Status: DC | PRN
  Administered 2018-10-08: 6000 [IU] via INTRAVENOUS

## 2018-10-08 MED ORDER — FENTANYL CITRATE (PF) 250 MCG/5ML IJ SOLN
INTRAMUSCULAR | Status: AC
  Filled 2018-10-08: qty 5

## 2018-10-08 MED ORDER — SODIUM CHLORIDE 0.9 % IV SOLN
INTRAVENOUS | Status: AC
  Filled 2018-10-08: qty 1.2

## 2018-10-08 MED ORDER — ONDANSETRON HCL 4 MG/2ML IJ SOLN
INTRAMUSCULAR | Status: DC | PRN
  Administered 2018-10-08: 4 mg via INTRAVENOUS

## 2018-10-08 SURGICAL SUPPLY — 31 items
ARMBAND PINK RESTRICT EXTREMIT (MISCELLANEOUS) ×3 IMPLANT
CANISTER SUCT 3000ML PPV (MISCELLANEOUS) ×3 IMPLANT
CANNULA VESSEL 3MM 2 BLNT TIP (CANNULA) IMPLANT
CLIP VESOCCLUDE MED 24/CT (CLIP) ×3 IMPLANT
CLIP VESOCCLUDE SM WIDE 24/CT (CLIP) ×3 IMPLANT
COVER PROBE W GEL 5X96 (DRAPES) IMPLANT
COVER WAND RF STERILE (DRAPES) ×3 IMPLANT
DECANTER SPIKE VIAL GLASS SM (MISCELLANEOUS) ×3 IMPLANT
DERMABOND ADVANCED (GAUZE/BANDAGES/DRESSINGS) ×2
DERMABOND ADVANCED .7 DNX12 (GAUZE/BANDAGES/DRESSINGS) ×1 IMPLANT
ELECT REM PT RETURN 9FT ADLT (ELECTROSURGICAL) ×3
ELECTRODE REM PT RTRN 9FT ADLT (ELECTROSURGICAL) ×1 IMPLANT
GLOVE BIO SURGEON STRL SZ7.5 (GLOVE) ×3 IMPLANT
GLOVE BIOGEL PI IND STRL 8 (GLOVE) ×1 IMPLANT
GLOVE BIOGEL PI INDICATOR 8 (GLOVE) ×2
GOWN STRL REUS W/ TWL LRG LVL3 (GOWN DISPOSABLE) ×3 IMPLANT
GOWN STRL REUS W/TWL LRG LVL3 (GOWN DISPOSABLE) ×6
KIT BASIN OR (CUSTOM PROCEDURE TRAY) ×3 IMPLANT
KIT TURNOVER KIT B (KITS) ×3 IMPLANT
NS IRRIG 1000ML POUR BTL (IV SOLUTION) ×3 IMPLANT
PACK CV ACCESS (CUSTOM PROCEDURE TRAY) ×3 IMPLANT
PAD ARMBOARD 7.5X6 YLW CONV (MISCELLANEOUS) ×6 IMPLANT
SPONGE SURGIFOAM ABS GEL 100 (HEMOSTASIS) IMPLANT
SUT PROLENE 6 0 BV (SUTURE) ×3 IMPLANT
SUT SILK 2 0 SH (SUTURE) IMPLANT
SUT VIC AB 3-0 SH 27 (SUTURE) ×2
SUT VIC AB 3-0 SH 27X BRD (SUTURE) ×1 IMPLANT
SUT VICRYL 4-0 PS2 18IN ABS (SUTURE) ×3 IMPLANT
TOWEL GREEN STERILE (TOWEL DISPOSABLE) ×3 IMPLANT
UNDERPAD 30X30 (UNDERPADS AND DIAPERS) ×3 IMPLANT
WATER STERILE IRR 1000ML POUR (IV SOLUTION) ×3 IMPLANT

## 2018-10-08 NOTE — Anesthesia Postprocedure Evaluation (Signed)
Anesthesia Post Note  Patient: Don [name]  Procedure(s) Performed: FIRST STAGE BASILIC VEIN TRANSPOSITION LEFT ARM (Left )     Patient location during evaluation: PACU Anesthesia Type: MAC Level of consciousness: awake Pain management: pain level controlled Vital Signs Assessment: post-procedure vital signs reviewed and stable Respiratory status: spontaneous breathing Cardiovascular status: stable Postop Assessment: no apparent nausea or vomiting Anesthetic complications: no    Last Vitals:  Vitals:   10/08/18 1105 10/08/18 1115  BP: (!) 128/92 (!) 136/91  Pulse: (!) 106 (!) 111  Resp: 17 18  Temp:    SpO2: 93% 95%    Last Pain:  Vitals:   10/08/18 1105  TempSrc:   PainSc: 0-No pain                 Kao Conry

## 2018-10-08 NOTE — Discharge Instructions (Signed)
° °  Vascular and Vein Specialists of [facility]  Discharge Instructions  AV Fistula or Graft Surgery for Dialysis Access  Please refer to the following instructions for your post-procedure care. Your surgeon or physician assistant will discuss any changes with you.  Activity  You may drive the day following your surgery, if you are comfortable and no longer taking prescription pain medication. Resume full activity as the soreness in your incision resolves.  Bathing/Showering  You may shower after you go home. Keep your incision dry for 48 hours. Do not soak in a bathtub, hot tub, or swim until the incision heals completely. You may not shower if you have a hemodialysis catheter.  Incision Care  Clean your incision with mild soap and water after 48 hours. Pat the area dry with a clean towel. You do not need a bandage unless otherwise instructed. Do not apply any ointments or creams to your incision. You may have skin glue on your incision. Do not peel it off. It will come off on its own in about one week. Your arm may swell a bit after surgery. To reduce swelling use pillows to elevate your arm so it is above your heart. Your doctor will tell you if you need to lightly wrap your arm with an ACE bandage.  Diet  Resume your normal diet. There are not special food restrictions following this procedure. In order to heal from your surgery, it is CRITICAL to get adequate nutrition. Your body requires vitamins, minerals, and protein. Vegetables are the best source of vitamins and minerals. Vegetables also provide the perfect balance of protein. Processed food has little nutritional value, so try to avoid this.  Medications  Resume taking all of your medications. If your incision is causing pain, you may take over-the counter pain relievers such as acetaminophen (Tylenol). If you were prescribed a stronger pain medication, please be aware these medications can cause nausea and constipation. Prevent  nausea by taking the medication with a snack or meal. Avoid constipation by drinking plenty of fluids and eating foods with high amount of fiber, such as fruits, vegetables, and grains.  Do not take Tylenol if you are taking prescription pain medications.  Follow up Your surgeon may want to see you in the office following your access surgery. If so, this will be arranged at the time of your surgery.  Please call us immediately for any of the following conditions:  Increased pain, redness, drainage (pus) from your incision site Fever of 101 degrees or higher Severe or worsening pain at your incision site Hand pain or numbness.  Reduce your risk of vascular disease:  Stop smoking. If you would like help, call [facility] at 1-800-QUIT-NOW 702 214 4640) or [place] at [place] your cholesterol Maintain a desired weight Control your diabetes Keep your blood pressure down  Dialysis  It will take several weeks to several months for your new dialysis access to be ready for use. Your surgeon will determine when it is okay to use it. Your nephrologist will continue to direct your dialysis. You can continue to use your Permcath until your new access is ready for use.   10/08/2018 Don [name] [id number] 1941-10-29  Surgeon(s): Angelia Mould, MD  Procedure(s): FIRST STAGE BASILIC VEIN TRANSPOSITION LEFT ARM  x Do not stick fistula for 12 weeks    If you have any questions, please call the office at [phone].

## 2018-10-08 NOTE — Interval H&P Note (Signed)
History and Physical Interval Note:  10/08/2018 9:22 AM  Don [name]  has presented today for surgery, with the diagnosis of CHRONIC KIDNEY DISEASE FOR HEMODIALYSIS ACCESS  The various methods of treatment have been discussed with the patient and family. After consideration of risks, benefits and other options for treatment, the patient has consented to  Procedure(s): FIRST STAGE BASILIC VEIN TRANSPOSITION LEFT ARM (Left) as a surgical intervention .  [name] history has been reviewed, patient examined, no change in status, stable for surgery.  I have reviewed the patient's chart and labs.  Questions were answered to the patient's satisfaction.     [name]

## 2018-10-08 NOTE — Transfer of Care (Signed)
Immediate Anesthesia Transfer of Care Note  Patient: Don [name]  Procedure(s) Performed: FIRST STAGE BASILIC VEIN TRANSPOSITION LEFT ARM (Left )  Patient Location: PACU  Anesthesia Type:MAC  Level of Consciousness: awake, patient cooperative and responds to stimulation  Airway & Oxygen Therapy: Patient Spontanous Breathing and Patient connected to face mask oxygen  Post-op Assessment: Report given to RN, Post -op Vital signs reviewed and stable and Patient moving all extremities X 4  Post vital signs: Reviewed and stable  Last Vitals:  Vitals Value Taken Time  BP 120/77 10/08/2018 10:48 AM  Temp    Pulse 123 10/08/2018 10:48 AM  Resp 22 10/08/2018 10:48 AM  SpO2 99 % 10/08/2018 10:48 AM  Vitals shown include unvalidated device data.  Last Pain:  Vitals:   10/08/18 0812  TempSrc:   PainSc: 0-No pain      Patients Stated Pain Goal: 2 (45/99/77 4142)  Complications: No apparent anesthesia complications

## 2018-10-08 NOTE — Anesthesia Postprocedure Evaluation (Signed)
Anesthesia Post Note  Patient: Don [name]  Procedure(s) Performed: FIRST STAGE BASILIC VEIN TRANSPOSITION LEFT ARM (Left )     Patient location during evaluation: PACU Anesthesia Type: MAC Level of consciousness: awake Pain management: pain level controlled Vital Signs Assessment: post-procedure vital signs reviewed and stable Respiratory status: spontaneous breathing Cardiovascular status: stable Postop Assessment: no apparent nausea or vomiting Anesthetic complications: no    Last Vitals:  Vitals:   10/08/18 1105 10/08/18 1115  BP: (!) 128/92 (!) 136/91  Pulse: (!) 106 (!) 111  Resp: 17 18  Temp:    SpO2: 93% 95%    Last Pain:  Vitals:   10/08/18 1105  TempSrc:   PainSc: 0-No pain                 Zaina Jenkin     

## 2018-10-08 NOTE — Op Note (Signed)
    NAME: Don [name]    MRN: [id number] DOB: Jul 20, 1941    DATE OF OPERATION: 10/08/2018  PREOP DIAGNOSIS:    Stage IV chronic kidney disease  POSTOP DIAGNOSIS:    Same  PROCEDURE:    Left first stage basilic vein transposition  SURGEON: Judeth Cornfield. Scot Dock, MD, FACS  ASSIST: Leontine Locket, PA  ANESTHESIA: Local with sedation  EBL: Minimal  INDICATIONS:    Don [name] is a 77 y.o. male who presents for new access.  He is not yet on dialysis.  FINDINGS:   3.5 mm basilic vein.  TECHNIQUE:   The patient was taken to the operating room and was sedated by anesthesia.  The left arm was prepped and draped in usual sterile fashion.  After the skin was anesthetized with 1% lidocaine, an incision was made over the basilic vein just above the antecubital level.  Here the vein was dissected free.  Branches were divided between clips and 3-0 silk ties.  The vein was ligated distally and irrigated up nicely with heparinized saline.  It was about a 3 to 3.5 mm vein.  The patient was heparinized.  The brachial artery was dissected free beneath the fascia.  The brachial artery was clamped proximally and distally and a longitudinal arteriotomy was made.  The vein was mobilized over and sewn into side to the artery using continuous 6-0 Prolene suture.  At the completion there was an excellent thrill in the fistula and a radial and ulnar signal with the Doppler.  The heparin was partially reversed with protamine.  The wound was closed with a deep layer of 3-0 Vicryl and the skin closed with 4-0 Vicryl.  Dermabond was applied.  Patient tolerated the procedure well was transferred to the recovery room in stable condition.  All needle and sponge counts were correct.  [name], MD, FACS [facility]  DATE OF DICTATION:   10/08/2018

## 2018-10-08 NOTE — Progress Notes (Signed)
Confirmed laterality with Dr. Gus Height arm

## 2018-10-08 NOTE — Anesthesia Preprocedure Evaluation (Addendum)
Anesthesia Evaluation  Patient identified by MRN, date of birth, ID band Patient awake    Reviewed: Allergy & Precautions, NPO status , Patient's Chart, lab work & pertinent test results  History of Anesthesia Complications (+) Emergence Delirium  Airway Mallampati: II  TM Distance: >3 FB     Dental   Pulmonary COPD, Current Smoker,    breath sounds clear to auscultation       Cardiovascular hypertension,  Rhythm:Regular Rate:Normal     Neuro/Psych    GI/Hepatic negative GI ROS, Neg liver ROS,   Endo/Other    Renal/GU Renal disease     Musculoskeletal   Abdominal   Peds  Hematology  (+) anemia ,   Anesthesia Other Findings   Reproductive/Obstetrics                            Anesthesia Physical Anesthesia Plan  ASA: III  Anesthesia Plan: MAC   Post-op Pain Management:    Induction: Intravenous  PONV Risk Score and Plan: Treatment may vary due to age or medical condition  Airway Management Planned: Nasal Cannula  Additional Equipment:   Intra-op Plan:   Post-operative Plan:   Informed Consent: I have reviewed the patients History and Physical, chart, labs and discussed the procedure including the risks, benefits and alternatives for the proposed anesthesia with the patient or authorized representative who has indicated his/her understanding and acceptance.   Dental advisory given  Plan Discussed with: CRNA and Anesthesiologist  Anesthesia Plan Comments:         Anesthesia Quick Evaluation

## 2018-10-08 NOTE — Progress Notes (Signed)
Discharge instructions reviewed with patient and stepson [name], paper prescription given to [name], no questions or concerns from patient or [name].  Rowe Pavy, RN

## 2018-10-09 ENCOUNTER — Telehealth: Payer: Self-pay | Admitting: Vascular Surgery

## 2018-10-09 ENCOUNTER — Encounter (HOSPITAL_COMMUNITY): Payer: Self-pay | Admitting: Vascular Surgery

## 2018-10-09 NOTE — Telephone Encounter (Signed)
sch appt [facility] 11/27/2018 10am Dialysis duplex 11am p/o MD

## 2018-10-09 NOTE — Telephone Encounter (Signed)
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  F/u with Dr. Scot Dock in 4-6 weeks with duplex as they will need to talk about 2nd stage surgery.  Thanks

## 2018-10-18 ENCOUNTER — Other Ambulatory Visit: Payer: Self-pay | Admitting: Family Medicine

## 2018-10-22 ENCOUNTER — Other Ambulatory Visit: Payer: Medicare HMO

## 2018-10-22 ENCOUNTER — Telehealth: Payer: Self-pay | Admitting: Family Medicine

## 2018-10-22 DIAGNOSIS — N183 Chronic kidney disease, stage 3 unspecified: Secondary | ICD-10-CM

## 2018-10-22 NOTE — Telephone Encounter (Signed)
Pt would like to have his "kidneys checked' with blood work because he is worried. Please advise?

## 2018-10-22 NOTE — Telephone Encounter (Signed)
Yes I am fine with that, I thought it was getting added onto the last lab work but it must have got missed, please add on a chemistry panel or come in and do a blood draw for a chemistry panel for renal function

## 2018-10-22 NOTE — Telephone Encounter (Signed)
Future lab order placed and pt POA is aware.

## 2018-10-23 ENCOUNTER — Encounter (HOSPITAL_COMMUNITY): Payer: Self-pay | Admitting: Emergency Medicine

## 2018-10-23 ENCOUNTER — Other Ambulatory Visit: Payer: Self-pay

## 2018-10-23 ENCOUNTER — Inpatient Hospital Stay (HOSPITAL_COMMUNITY)
Admission: EM | Admit: 2018-10-23 | Discharge: 2018-10-31 | DRG: 291 | Disposition: A | Discharge status: Skilled Nursing Facility | Payer: Medicare HMO | Attending: Internal Medicine | Admitting: Internal Medicine

## 2018-10-23 ENCOUNTER — Emergency Department (HOSPITAL_COMMUNITY): Payer: Medicare HMO

## 2018-10-23 DIAGNOSIS — N184 Chronic kidney disease, stage 4 (severe): Secondary | ICD-10-CM | POA: Diagnosis not present

## 2018-10-23 DIAGNOSIS — R195 Other fecal abnormalities: Secondary | ICD-10-CM | POA: Diagnosis not present

## 2018-10-23 DIAGNOSIS — K219 Gastro-esophageal reflux disease without esophagitis: Secondary | ICD-10-CM | POA: Diagnosis present

## 2018-10-23 DIAGNOSIS — R5381 Other malaise: Secondary | ICD-10-CM | POA: Diagnosis not present

## 2018-10-23 DIAGNOSIS — N185 Chronic kidney disease, stage 5: Secondary | ICD-10-CM | POA: Diagnosis not present

## 2018-10-23 DIAGNOSIS — Z515 Encounter for palliative care: Secondary | ICD-10-CM | POA: Diagnosis present

## 2018-10-23 DIAGNOSIS — N2581 Secondary hyperparathyroidism of renal origin: Secondary | ICD-10-CM | POA: Diagnosis not present

## 2018-10-23 DIAGNOSIS — Z8249 Family history of ischemic heart disease and other diseases of the circulatory system: Secondary | ICD-10-CM

## 2018-10-23 DIAGNOSIS — E8770 Fluid overload, unspecified: Secondary | ICD-10-CM | POA: Diagnosis not present

## 2018-10-23 DIAGNOSIS — F329 Major depressive disorder, single episode, unspecified: Secondary | ICD-10-CM | POA: Diagnosis present

## 2018-10-23 DIAGNOSIS — Z992 Dependence on renal dialysis: Secondary | ICD-10-CM

## 2018-10-23 DIAGNOSIS — I959 Hypotension, unspecified: Secondary | ICD-10-CM | POA: Diagnosis not present

## 2018-10-23 DIAGNOSIS — D631 Anemia in chronic kidney disease: Secondary | ICD-10-CM | POA: Diagnosis present

## 2018-10-23 DIAGNOSIS — Z9079 Acquired absence of other genital organ(s): Secondary | ICD-10-CM

## 2018-10-23 DIAGNOSIS — R079 Chest pain, unspecified: Secondary | ICD-10-CM

## 2018-10-23 DIAGNOSIS — E785 Hyperlipidemia, unspecified: Secondary | ICD-10-CM | POA: Diagnosis present

## 2018-10-23 DIAGNOSIS — J9601 Acute respiratory failure with hypoxia: Secondary | ICD-10-CM | POA: Diagnosis not present

## 2018-10-23 DIAGNOSIS — F419 Anxiety disorder, unspecified: Secondary | ICD-10-CM | POA: Diagnosis present

## 2018-10-23 DIAGNOSIS — J441 Chronic obstructive pulmonary disease with (acute) exacerbation: Secondary | ICD-10-CM | POA: Diagnosis not present

## 2018-10-23 DIAGNOSIS — D649 Anemia, unspecified: Secondary | ICD-10-CM

## 2018-10-23 DIAGNOSIS — F17210 Nicotine dependence, cigarettes, uncomplicated: Secondary | ICD-10-CM | POA: Diagnosis present

## 2018-10-23 DIAGNOSIS — N400 Enlarged prostate without lower urinary tract symptoms: Secondary | ICD-10-CM | POA: Diagnosis present

## 2018-10-23 DIAGNOSIS — Z7901 Long term (current) use of anticoagulants: Secondary | ICD-10-CM

## 2018-10-23 DIAGNOSIS — I132 Hypertensive heart and chronic kidney disease with heart failure and with stage 5 chronic kidney disease, or end stage renal disease: Secondary | ICD-10-CM | POA: Diagnosis not present

## 2018-10-23 DIAGNOSIS — I129 Hypertensive chronic kidney disease with stage 1 through stage 4 chronic kidney disease, or unspecified chronic kidney disease: Secondary | ICD-10-CM | POA: Diagnosis not present

## 2018-10-23 DIAGNOSIS — N186 End stage renal disease: Secondary | ICD-10-CM | POA: Diagnosis not present

## 2018-10-23 DIAGNOSIS — Z86718 Personal history of other venous thrombosis and embolism: Secondary | ICD-10-CM

## 2018-10-23 DIAGNOSIS — Z8546 Personal history of malignant neoplasm of prostate: Secondary | ICD-10-CM | POA: Diagnosis not present

## 2018-10-23 DIAGNOSIS — I482 Chronic atrial fibrillation, unspecified: Secondary | ICD-10-CM | POA: Diagnosis not present

## 2018-10-23 DIAGNOSIS — Z7951 Long term (current) use of inhaled steroids: Secondary | ICD-10-CM

## 2018-10-23 DIAGNOSIS — K5900 Constipation, unspecified: Secondary | ICD-10-CM | POA: Diagnosis present

## 2018-10-23 DIAGNOSIS — Z9114 Patient's other noncompliance with medication regimen: Secondary | ICD-10-CM

## 2018-10-23 DIAGNOSIS — I5033 Acute on chronic diastolic (congestive) heart failure: Secondary | ICD-10-CM

## 2018-10-23 DIAGNOSIS — N189 Chronic kidney disease, unspecified: Secondary | ICD-10-CM

## 2018-10-23 DIAGNOSIS — E440 Moderate protein-calorie malnutrition: Secondary | ICD-10-CM | POA: Diagnosis not present

## 2018-10-23 DIAGNOSIS — R0602 Shortness of breath: Secondary | ICD-10-CM | POA: Diagnosis not present

## 2018-10-23 DIAGNOSIS — Z888 Allergy status to other drugs, medicaments and biological substances status: Secondary | ICD-10-CM

## 2018-10-23 DIAGNOSIS — J9600 Acute respiratory failure, unspecified whether with hypoxia or hypercapnia: Secondary | ICD-10-CM | POA: Diagnosis present

## 2018-10-23 DIAGNOSIS — N178 Other acute kidney failure: Secondary | ICD-10-CM | POA: Diagnosis not present

## 2018-10-23 DIAGNOSIS — Z720 Tobacco use: Secondary | ICD-10-CM | POA: Diagnosis not present

## 2018-10-23 DIAGNOSIS — Z66 Do not resuscitate: Secondary | ICD-10-CM | POA: Diagnosis not present

## 2018-10-23 DIAGNOSIS — H3530 Unspecified macular degeneration: Secondary | ICD-10-CM | POA: Diagnosis present

## 2018-10-23 DIAGNOSIS — Z6821 Body mass index (BMI) 21.0-21.9, adult: Secondary | ICD-10-CM

## 2018-10-23 DIAGNOSIS — I120 Hypertensive chronic kidney disease with stage 5 chronic kidney disease or end stage renal disease: Secondary | ICD-10-CM | POA: Diagnosis not present

## 2018-10-23 DIAGNOSIS — N179 Acute kidney failure, unspecified: Secondary | ICD-10-CM

## 2018-10-23 DIAGNOSIS — Z79899 Other long term (current) drug therapy: Secondary | ICD-10-CM

## 2018-10-23 DIAGNOSIS — Z7401 Bed confinement status: Secondary | ICD-10-CM | POA: Diagnosis not present

## 2018-10-23 DIAGNOSIS — Z4901 Encounter for fitting and adjustment of extracorporeal dialysis catheter: Secondary | ICD-10-CM | POA: Diagnosis not present

## 2018-10-23 LAB — CMP14+EGFR
ALBUMIN: 3.4 g/dL — AB (ref 3.5–4.8)
ALT: 13 IU/L (ref 0–44)
AST: 12 IU/L (ref 0–40)
Albumin/Globulin Ratio: 1.8 (ref 1.2–2.2)
Alkaline Phosphatase: 130 IU/L — ABNORMAL HIGH (ref 39–117)
BILIRUBIN TOTAL: 0.5 mg/dL (ref 0.0–1.2)
BUN/Creatinine Ratio: 15 (ref 10–24)
BUN: 61 mg/dL — ABNORMAL HIGH (ref 8–27)
CHLORIDE: 108 mmol/L — AB (ref 96–106)
CO2: 19 mmol/L — AB (ref 20–29)
CREATININE: 4.07 mg/dL — AB (ref 0.76–1.27)
Calcium: 8.5 mg/dL — ABNORMAL LOW (ref 8.6–10.2)
GFR, EST AFRICAN AMERICAN: 15 mL/min/{1.73_m2} — AB (ref >59)
GFR, EST NON AFRICAN AMERICAN: 13 mL/min/{1.73_m2} — AB (ref >59)
GLOBULIN, TOTAL: 1.9 g/dL (ref 1.5–4.5)
GLUCOSE: 99 mg/dL (ref 65–99)
Potassium: 4.9 mmol/L (ref 3.5–5.2)
SODIUM: 142 mmol/L (ref 134–144)
Total Protein: 5.3 g/dL — ABNORMAL LOW (ref 6.0–8.5)

## 2018-10-23 LAB — CBC
HCT: 25.5 % — ABNORMAL LOW (ref 39.0–52.0)
HEMOGLOBIN: 7.8 g/dL — AB (ref 13.0–17.0)
MCH: 32.6 pg (ref 26.0–34.0)
MCHC: 30.6 g/dL (ref 30.0–36.0)
MCV: 106.7 fL — AB (ref 80.0–100.0)
NRBC: 0 % (ref 0.0–0.2)
Platelets: 202 10*3/uL (ref 150–400)
RBC: 2.39 MIL/uL — AB (ref 4.22–5.81)
RDW: 14.6 % (ref 11.5–15.5)
WBC: 7.8 10*3/uL (ref 4.0–10.5)

## 2018-10-23 LAB — BASIC METABOLIC PANEL
ANION GAP: 9 (ref 5–15)
BUN: 75 mg/dL — AB (ref 8–23)
CO2: 19 mmol/L — ABNORMAL LOW (ref 22–32)
Calcium: 8.4 mg/dL — ABNORMAL LOW (ref 8.9–10.3)
Chloride: 112 mmol/L — ABNORMAL HIGH (ref 98–111)
Creatinine, Ser: 4.39 mg/dL — ABNORMAL HIGH (ref 0.61–1.24)
GFR calc non Af Amer: 12 mL/min — ABNORMAL LOW (ref >60)
GFR, EST AFRICAN AMERICAN: 14 mL/min — AB (ref >60)
Glucose, Bld: 91 mg/dL (ref 70–99)
Potassium: 5 mmol/L (ref 3.5–5.1)
Sodium: 140 mmol/L (ref 135–145)

## 2018-10-23 LAB — PROTIME-INR
INR: 1.86
Prothrombin Time: 21.2 seconds — ABNORMAL HIGH (ref 11.4–15.2)

## 2018-10-23 LAB — OCCULT BLOOD, POC DEVICE: Fecal Occult Bld: POSITIVE — AB

## 2018-10-23 LAB — TROPONIN I
TROPONIN I: 0.06 ng/mL — AB (ref <0.03)
Troponin I: 0.05 ng/mL (ref <0.03)

## 2018-10-23 LAB — PREPARE RBC (CROSSMATCH)

## 2018-10-23 MED ORDER — ONDANSETRON HCL 4 MG PO TABS: 4.0000 mg | ORAL_TABLET | Freq: Four times a day (QID) | ORAL | Status: DC | PRN

## 2018-10-23 MED ORDER — CLONAZEPAM 0.5 MG PO TABS
0.5000 mg | ORAL_TABLET | Freq: Every day | ORAL | Status: DC
  Administered 2018-10-23 – 2018-10-30 (×8): 0.5 mg via ORAL
  Filled 2018-10-23 (×8): qty 1

## 2018-10-23 MED ORDER — SODIUM CHLORIDE 0.9 % IV SOLN: 10.0000 mL/h | Freq: Once | INTRAVENOUS | Status: DC

## 2018-10-23 MED ORDER — IPRATROPIUM-ALBUTEROL 0.5-2.5 (3) MG/3ML IN SOLN
3.0000 mL | Freq: Once | RESPIRATORY_TRACT | Status: AC
  Administered 2018-10-23: 3 mL via RESPIRATORY_TRACT
  Filled 2018-10-23: qty 3

## 2018-10-23 MED ORDER — ONDANSETRON HCL 4 MG/2ML IJ SOLN: 4.0000 mg | Freq: Four times a day (QID) | INTRAMUSCULAR | Status: DC | PRN

## 2018-10-23 MED ORDER — TERAZOSIN HCL 1 MG PO CAPS
2.0000 mg | ORAL_CAPSULE | Freq: Every day | ORAL | Status: DC
  Administered 2018-10-23 – 2018-10-30 (×8): 2 mg via ORAL
  Filled 2018-10-23: qty 1
  Filled 2018-10-23 (×2): qty 2
  Filled 2018-10-23: qty 1
  Filled 2018-10-23: qty 2
  Filled 2018-10-23: qty 1
  Filled 2018-10-23 (×2): qty 2
  Filled 2018-10-23: qty 1
  Filled 2018-10-23 (×2): qty 2
  Filled 2018-10-23: qty 1

## 2018-10-23 MED ORDER — CALCITRIOL 0.25 MCG PO CAPS
0.2500 ug | ORAL_CAPSULE | Freq: Every morning | ORAL | Status: DC
  Administered 2018-10-24 – 2018-10-31 (×6): 0.25 ug via ORAL
  Filled 2018-10-23 (×6): qty 1

## 2018-10-23 MED ORDER — FUROSEMIDE 10 MG/ML IJ SOLN
60.0000 mg | Freq: Once | INTRAMUSCULAR | Status: AC
  Administered 2018-10-23: 60 mg via INTRAVENOUS
  Filled 2018-10-23: qty 6

## 2018-10-23 MED ORDER — MELATONIN 3 MG PO TABS
3.0000 mg | ORAL_TABLET | Freq: Once | ORAL | Status: AC
  Administered 2018-10-23: 3 mg via ORAL
  Filled 2018-10-23: qty 1

## 2018-10-23 MED ORDER — SODIUM CHLORIDE 0.9 % IV BOLUS
500.0000 mL | Freq: Once | INTRAVENOUS | Status: AC
  Administered 2018-10-23: 500 mL via INTRAVENOUS

## 2018-10-23 MED ORDER — ATORVASTATIN CALCIUM 20 MG PO TABS
20.0000 mg | ORAL_TABLET | Freq: Every day | ORAL | Status: DC
  Administered 2018-10-23 – 2018-10-30 (×8): 20 mg via ORAL
  Filled 2018-10-23 (×2): qty 1
  Filled 2018-10-23: qty 2
  Filled 2018-10-23 (×5): qty 1

## 2018-10-23 MED ORDER — BISOPROLOL FUMARATE 5 MG PO TABS
10.0000 mg | ORAL_TABLET | Freq: Every morning | ORAL | Status: DC
  Administered 2018-10-24 – 2018-10-31 (×6): 10 mg via ORAL
  Filled 2018-10-23 (×6): qty 2

## 2018-10-23 MED ORDER — POLYVINYL ALCOHOL 1.4 % OP SOLN
1.0000 [drp] | Freq: Every day | OPHTHALMIC | Status: DC | PRN
  Administered 2018-10-24 – 2018-10-27 (×4): 1 [drp] via OPHTHALMIC
  Filled 2018-10-23 (×2): qty 15

## 2018-10-23 MED ORDER — PANTOPRAZOLE SODIUM 40 MG PO TBEC
40.0000 mg | DELAYED_RELEASE_TABLET | Freq: Two times a day (BID) | ORAL | Status: DC
  Administered 2018-10-23 – 2018-10-31 (×14): 40 mg via ORAL
  Filled 2018-10-23 (×14): qty 1

## 2018-10-23 MED ORDER — MOMETASONE FURO-FORMOTEROL FUM 200-5 MCG/ACT IN AERO
2.0000 | INHALATION_SPRAY | Freq: Two times a day (BID) | RESPIRATORY_TRACT | Status: DC
  Filled 2018-10-23: qty 8.8

## 2018-10-23 MED ORDER — LORATADINE 10 MG PO TABS
10.0000 mg | ORAL_TABLET | Freq: Every day | ORAL | Status: DC
  Administered 2018-10-23 – 2018-10-30 (×8): 10 mg via ORAL
  Filled 2018-10-23 (×8): qty 1

## 2018-10-23 MED ORDER — LEVALBUTEROL HCL 0.63 MG/3ML IN NEBU
0.6300 mg | INHALATION_SOLUTION | Freq: Four times a day (QID) | RESPIRATORY_TRACT | Status: DC
  Administered 2018-10-23 – 2018-10-25 (×9): 0.63 mg via RESPIRATORY_TRACT
  Filled 2018-10-23 (×10): qty 3

## 2018-10-23 MED ORDER — IPRATROPIUM BROMIDE 0.02 % IN SOLN
0.5000 mg | Freq: Four times a day (QID) | RESPIRATORY_TRACT | Status: DC
  Administered 2018-10-23 – 2018-10-25 (×9): 0.5 mg via RESPIRATORY_TRACT
  Filled 2018-10-23 (×10): qty 2.5

## 2018-10-23 MED ORDER — ACETAMINOPHEN 500 MG PO TABS
500.0000 mg | ORAL_TABLET | Freq: Four times a day (QID) | ORAL | Status: DC | PRN
  Administered 2018-10-27 – 2018-10-31 (×4): 500 mg via ORAL
  Filled 2018-10-23 (×4): qty 1

## 2018-10-23 MED ORDER — FUROSEMIDE 10 MG/ML IJ SOLN
60.0000 mg | Freq: Three times a day (TID) | INTRAMUSCULAR | Status: DC
  Administered 2018-10-24 (×2): 60 mg via INTRAVENOUS
  Filled 2018-10-23 (×2): qty 6

## 2018-10-23 MED ORDER — NON FORMULARY: 3.0000 mg | Freq: Once | Status: DC

## 2018-10-23 MED ORDER — ACITRETIN 10 MG PO CAPS: 10.0000 mg | ORAL_CAPSULE | Freq: Every day | ORAL | Status: DC

## 2018-10-23 MED ORDER — DILTIAZEM HCL ER COATED BEADS 240 MG PO CP24
240.0000 mg | ORAL_CAPSULE | Freq: Every morning | ORAL | Status: DC
  Administered 2018-10-24 – 2018-10-31 (×6): 240 mg via ORAL
  Filled 2018-10-23 (×6): qty 1

## 2018-10-23 NOTE — ED Triage Notes (Signed)
Patient complaining of chest pain and weakness since last night.

## 2018-10-23 NOTE — H&P (Signed)
TRH H&P    Patient Demographics:    Don [name], is a 77 y.o. male  MRN: [id number]  DOB - 1941/11/10  Admit Date - 10/23/2018  Referring MD/NP/PA: Dr. Melina Copa  Outpatient Primary MD for the patient is Dettinger, Fransisca Kaufmann, MD  Patient coming from: Home  Chief complaint-shortness of breath   HPI:    Don [name]  is a 77 y.o. male, with history of atrial fibrillation on chronic anticoagulation with Coumadin, COPD, hypertension, prostate cancer status post prostatectomy, CKD stage IV came to hospital with fatigue, shortness of breath, palpitations which started yesterday.  Patient said the symptoms started after he ate takeout food, after that it became fatigue had palpitations.  Patient also has had AV fistula made but has not started dialysis yet.  He also complains of black-colored stool. He denies nausea vomiting or diarrhea. Denies passing frank blood in stool He also complains of chest heaviness.  Denies chest pain at this time. Denies abdominal pain. Denies dysuria.  In the ED he was found to be anemic with hemoglobin 7.8, patient's baseline hemoglobin around 8.5-9.  He became hypoxic in the ED with O2 sats 88%, requiring 3 L of oxygen per minute. Chest x-ray showed interstitial edema     Review of systems:    In addition to the HPI above,   All other systems reviewed and are negative.    Past History of the following :    Past Medical History:  Diagnosis Date  . Acute bronchitis 04/03/2014  . Anemia    low iron  . Anxiety   . Atrial fibrillation ([place])   . Cataract   . COPD (chronic obstructive pulmonary disease) ([place])   . Depression   . Essential hypertension   . Hyperlipidemia   . Macular degeneration   . Noncompliance with medications 04/2013   Xarelto, digoxin previously  . Pre-diabetes   . Prostate cancer ([place])   . 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  Tubular adenoma of colon 07/31/02, 11/18/03      Past Surgical History:  Procedure Laterality Date  . Anal abcess,Hemorroids,    . BASCILIC VEIN TRANSPOSITION Left 10/08/2018   Procedure: FIRST STAGE BASILIC VEIN TRANSPOSITION LEFT ARM;  Surgeon: Angelia Mould, MD;  Location: [facility] OR;  Service: Vascular;  Laterality: Left;  . COLONOSCOPY N/A 04/05/2014   Dr. Fuller Plan: 6 mm sessile polyp from sigmoid colon (tubular adenoma), internal hemorrhoids  . COLONOSCOPY W/ BIOPSIES  11/18/2003   Dr. Earle Gell  . ESOPHAGOGASTRODUODENOSCOPY  11/18/2003   Dr. Earle Gell  . ESOPHAGOGASTRODUODENOSCOPY N/A 04/05/2014   Dr. Fuller Plan: variable Z line, negative Barrett's, small hiatal hernia  . ESOPHAGOGASTRODUODENOSCOPY N/A 02/02/2017   Dr. Oneida Alar: LA Grade B esophagitis, one moderate benign-appearing intrinsic stenosis traversed, small non-bleeding diverticulum in second portion of duodenum, reactive gastropathy, no H.pylori.  . ESOPHAGOGASTRODUODENOSCOPY N/A 03/22/2018   Procedure: ESOPHAGOGASTRODUODENOSCOPY (EGD);  Surgeon: Daneil Dolin, MD;  [facility];  Service: Endoscopy;  Laterality: N/A;  . GIVENS CAPSULE STUDY  N/A 03/22/2018   Procedure: GIVENS CAPSULE STUDY;  Surgeon: Daneil Dolin, MD;  Location: [facility];  Service: Endoscopy;  Laterality: N/A;  . RETROPUBIC PROSTATECTOMY  11/26/2001  . TONSILLECTOMY        Social History:      Social History   Tobacco Use  . Smoking status: Current Some Day Smoker    Packs/day: 0.50    Years: 62.00    Pack years: 31.00    Types: Cigarettes, Cigars    Last attempt to quit: 12/28/2016    Years since quitting: 1.8  . Smokeless tobacco: Never Used  . Tobacco comment: "very little"  Substance Use Topics  . Alcohol use: No       Family History :     Family History  Problem Relation Age of Onset  . Diabetes Father   . Heart disease Father 51       MI  . Heart attack Father   . Heart attack Mother   . Heart disease  Brother   . Cancer Brother        lung  . Lung cancer Brother   . Heart disease Brother   . Lung cancer Sister   . Cancer Sister        lung  . Heart disease Brother   . Heart disease Brother       Home Medications:   Prior to Admission medications   Medication Sig Start Date End Date Taking? Authorizing Provider  acetaminophen (TYLENOL) 500 MG tablet Take 500 mg by mouth every 6 (six) hours as needed for moderate pain.    Yes [provider]  acitretin (SORIATANE) 10 MG capsule Take 10 mg by mouth daily.    Yes [provider]  albuterol (PROAIR HFA) 108 (90 Base) MCG/ACT inhaler Inhale 2 puffs into the lungs 2 (two) times daily.    Yes [provider]  apixaban (ELIQUIS) 5 MG TABS tablet Take 1 tablet (5 mg total) by mouth 2 (two) times daily. (Needs to be seen) Patient taking differently: Take 5 mg by mouth 2 (two) times daily.  08/16/18  Yes Dettinger, Fransisca Kaufmann, MD  atorvastatin (LIPITOR) 20 MG tablet Take 1 tablet (20 mg total) by mouth daily. (Needs to be seen) Patient taking differently: Take 20 mg by mouth at bedtime.  08/16/18  Yes Dettinger, Fransisca Kaufmann, MD  bisoprolol (ZEBETA) 10 MG tablet TAKE (1) TABLET TWICE A DAY. Patient taking differently: Take 10 mg by mouth every morning.  10/18/18  Yes Dettinger, Fransisca Kaufmann, MD  budesonide-formoterol (SYMBICORT) 160-4.5 MCG/ACT inhaler Inhale 2 puffs into the lungs 2 (two) times daily.   Yes [provider]  calcitRIOL (ROCALTROL) 0.25 MCG capsule TAKE (1) CAPSULE DAILY Patient taking differently: Take 0.25 mcg by mouth every morning.  08/16/18  Yes Dettinger, Fransisca Kaufmann, MD  clonazePAM (KLONOPIN) 1 MG tablet TAKE 1/2 TABLET ONCE DAILY AS NEEDED FOR ANXIETY Patient taking differently: Take 0.5 mg by mouth at bedtime. TAKE 1/2 TABLET ONCE DAILY AS NEEDED FOR ANXIETY 08/16/18  Yes Dettinger, Fransisca Kaufmann, MD  diltiazem (CARDIZEM CD) 240 MG 24 hr capsule TAKE (1) CAPSULE DAILY Patient taking differently: Take 240  mg by mouth every morning.  08/16/18  Yes Dettinger, Fransisca Kaufmann, MD  ferrous sulfate 325 (65 FE) MG tablet Take 325 mg by mouth every Monday, Wednesday, and Friday.   Yes [provider]  hydroxypropyl methylcellulose / hypromellose (ISOPTO TEARS / GONIOVISC) 2.5 % ophthalmic solution Place 1  drop into both eyes daily as needed for dry eyes.   Yes [provider]  iron polysaccharides (FERREX 150) 150 MG capsule Take 1 capsule (150 mg total) by mouth daily. (Needs to be seen) Patient taking differently: Take 150 mg by mouth every morning. (Needs to be seen) 08/16/18  Yes Dettinger, Fransisca Kaufmann, MD  loratadine (CLARITIN) 10 MG tablet TAKE 1 TABLET DAILY Patient taking differently: Take 10 mg by mouth at bedtime.  09/18/18  Yes Dettinger, Fransisca Kaufmann, MD  Melatonin 3 MG TABS Take 3 mg by mouth at bedtime.    Yes [provider]  Multiple Vitamin (MULTIVITAMIN WITH MINERALS) TABS tablet Take 1 tablet by mouth at bedtime.    Yes [provider]  Multiple Vitamins-Minerals (PRESERVISION AREDS 2 PO) Take 1 tablet by mouth 2 (two) times daily.    Yes [provider]  omega-3 acid ethyl esters (LOVAZA) 1 g capsule Take 2 g by mouth 2 (two) times daily.   Yes [provider]  pantoprazole (PROTONIX) 40 MG tablet TAKE (1) TABLET TWICE A DAY. Patient taking differently: Take 40 mg by mouth 2 (two) times daily.  09/12/18  Yes Dettinger, Fransisca Kaufmann, MD  terazosin (HYTRIN) 2 MG capsule TAKE 1 CAPSULE AT BEDTIME Patient taking differently: Take 2 mg by mouth at bedtime.  05/20/18  Yes Dettinger, Fransisca Kaufmann, MD  tiotropium (SPIRIVA) 18 MCG inhalation capsule Place 18 mcg into inhaler and inhale daily as needed (for shortness of breath).    Yes [provider]  oxyCODONE (ROXICODONE) 5 MG immediate release tablet Take 1 tablet (5 mg total) by mouth every 6 (six) hours as needed. Patient not taking: Reported on 10/23/2018 10/08/18   Gabriel Earing, PA-C      Allergies:     Allergies  Allergen Reactions  . Wellbutrin [Bupropion] Anxiety     Physical Exam:   Vitals  Blood pressure (!) 130/93, pulse (!) 104, temperature 98.2 F (36.8 C), temperature source Oral, resp. rate (!) 27, height 5\' 9"  (1.753 m), weight 69.9 kg, SpO2 97 %.  1.  General: Appears in mild respiratory distress  2. Psychiatric:  Intact judgement and  insight, awake alert, oriented x 3.  3. Neurologic: No focal neurological deficits, all cranial nerves intact.Strength 5/5 all 4 extremities, sensation intact all 4 extremities, plantars down going.  4. Eyes :  anicteric sclerae, moist conjunctivae with no lid lag. PERRLA.  5. ENMT:  Oropharynx clear with moist mucous membranes and good dentition  6. Neck:  supple, no cervical lymphadenopathy appriciated, No thyromegaly, positive JVD  7. Respiratory : Normal respiratory effort, bilateral rhonchi auscultated  8. Cardiovascular : RRR, no gallops, rubs or murmurs, no leg edema  9. Gastrointestinal:  Positive bowel sounds, abdomen soft, non-tender to palpation,no hepatosplenomegaly, no rigidity or guarding       10. Skin:  No cyanosis, normal texture and turgor, no rash, lesions or ulcers  11.Musculoskeletal:  Good muscle tone,  joints appear normal , no effusions,  normal range of motion    Data Review:    CBC Recent Labs  Lab 10/23/18 1602  WBC 7.8  HGB 7.8*  HCT 25.5*  PLT 202  MCV 106.7*  MCH 32.6  MCHC 30.6  RDW 14.6   ------------------------------------------------------------------------------------------------------------------  Results for orders placed or performed during the hospital encounter of 10/23/18 (from the past 48 hour(s))  Basic metabolic panel     Status: Abnormal   Collection Time: 10/23/18  4:02 PM  Result Value Ref Range   Sodium 140 135 - 145 mmol/L   Potassium 5.0 3.5 - 5.1 mmol/L   Chloride 112 (H) 98 - 111 mmol/L   CO2 19 (L) 22 - 32 mmol/L   Glucose, Bld 91  70 - 99 mg/dL   BUN 75 (H) 8 - 23 mg/dL   Creatinine, Ser 4.39 (H) 0.61 - 1.24 mg/dL   Calcium 8.4 (L) 8.9 - 10.3 mg/dL   GFR calc non Af Amer 12 (L) >60 mL/min   GFR calc Af Amer 14 (L) >60 mL/min   Anion gap 9 5 - 15    Comment: Performed at [facility], [address], [place], [place] [address]  CBC     Status: Abnormal   Collection Time: 10/23/18  4:02 PM  Result Value Ref Range   WBC 7.8 4.0 - 10.5 K/uL   RBC 2.39 (L) 4.22 - 5.81 MIL/uL   Hemoglobin 7.8 (L) 13.0 - 17.0 g/dL   HCT 25.5 (L) 39.0 - 52.0 %   MCV 106.7 (H) 80.0 - 100.0 fL   MCH 32.6 26.0 - 34.0 pg   MCHC 30.6 30.0 - 36.0 g/dL   RDW 14.6 11.5 - 15.5 %   Platelets 202 150 - 400 K/uL   nRBC 0.0 0.0 - 0.2 %    Comment: Performed at [facility], [address] [place], [place] [address]  Troponin I - ONCE - STAT     Status: Abnormal   Collection Time: 10/23/18  4:02 PM  Result Value Ref Range   Troponin I 0.06 (HH) <0.03 ng/mL    Comment: CRITICAL RESULT CALLED TO, READ BACK BY AND VERIFIED WITH: ESTWOOD,J AT 1707 ON 12.11.2019 BY ISLEY,B Performed at [facility], [address], [place], [place] [address]   Occult blood, poc device     Status: Abnormal   Collection Time: 10/23/18  5:14 PM  Result Value Ref Range   Fecal Occult Bld POSITIVE (A) NEGATIVE  Type and screen [facility]     Status: None (Preliminary result)   Collection Time: 10/23/18  6:03 PM  Result Value Ref Range   ABO/RH(D) A POS    Antibody Screen NEG    Sample Expiration 10/26/2018    Unit Number [id number]    Blood Component Type RED CELLS,LR    Unit division 00    Status of Unit ALLOCATED    Transfusion Status OK TO TRANSFUSE    Crossmatch Result      Compatible Performed at [facility], [address] [name], [place] [address]   Protime-INR     Status: Abnormal   Collection Time: 10/23/18  6:03 PM  Result Value Ref Range   Prothrombin Time 21.2 (H) 11.4 - 15.2 seconds   INR 1.86     Comment: Performed at [facility], [address], [place], [place] [address]  Prepare RBC     Status: None   Collection Time: 10/23/18  6:03 PM  Result Value Ref Range   Order Confirmation      ORDER PROCESSED BY BLOOD BANK Performed at [facility], [address], [place], [place] [address]     Chemistries  Recent Labs  Lab 10/22/18 1546 10/23/18 1602  NA 142 140  K 4.9 5.0  CL 108* 112*  CO2 19* 19*  GLUCOSE 99 91  BUN 61* 75*  CREATININE 4.07* 4.39*  CALCIUM 8.5* 8.4*  AST 12  --   ALT 13  --  ALKPHOS 130*  --   BILITOT 0.5  --    ------------------------------------------------------------------------------------------------------------------  ------------------------------------------------------------------------------------------------------------------ GFR: Estimated Creatinine Clearance: 13.5 mL/min (A) (by C-G formula based on SCr of 4.39 mg/dL (H)). Liver Function Tests: Recent Labs  Lab 10/22/18 1546  AST 12  ALT 13  ALKPHOS 130*  BILITOT 0.5  PROT 5.3*  ALBUMIN 3.4*   No results for input(s): LIPASE, AMYLASE in the last 168 hours. No results for input(s): AMMONIA in the last 168 hours. Coagulation Profile: Recent Labs  Lab 10/23/18 1803  INR 1.86   Cardiac Enzymes: Recent Labs  Lab 10/23/18 1602  TROPONINI 0.06*   BNP (last 3 results) No results for input(s): PROBNP in the last 8760 hours. HbA1C: No results for input(s): HGBA1C in the last 72 hours. CBG: No results for input(s): GLUCAP in the last 168 hours. Lipid Profile: No results for input(s): CHOL, HDL, LDLCALC, TRIG, CHOLHDL, LDLDIRECT in the last 72 hours. Thyroid Function Tests: No results for input(s): TSH, T4TOTAL, FREET4, T3FREE, THYROIDAB in the last 72 hours. Anemia Panel: No results for input(s): VITAMINB12, FOLATE, FERRITIN, TIBC, IRON, RETICCTPCT in the last 72 hours.  --------------------------------------------------------------------------------------------------------------- Urine analysis:     Component Value Date/Time   COLORURINE YELLOW 03/15/2018 2110   APPEARANCEUR HAZY (A) 03/15/2018 2110   LABSPEC 1.013 03/15/2018 2110   PHURINE 5.0 03/15/2018 2110   GLUCOSEU NEGATIVE 03/15/2018 2110   HGBUR NEGATIVE 03/15/2018 2110   BILIRUBINUR NEGATIVE 03/15/2018 2110   BILIRUBINUR neg 10/07/2014 1122   KETONESUR NEGATIVE 03/15/2018 2110   PROTEINUR 100 (A) 03/15/2018 2110   UROBILINOGEN negative 10/07/2014 1122   UROBILINOGEN 0.2 04/03/2014 2308   NITRITE NEGATIVE 03/15/2018 2110   LEUKOCYTESUR NEGATIVE 03/15/2018 2110      Imaging Results:    Dg Chest 2 View  Result Date: 10/23/2018 CLINICAL DATA:  Chest pain and weakness EXAM: CHEST - 2 VIEW COMPARISON:  03/19/2018 FINDINGS: Bilateral diffuse mild interstitial and patchy alveolar airspace opacities. Two areas of nodular airspace disease in the left upper lobe and left lower lobe. Small bilateral pleural effusions. No pneumothorax. Stable cardiomediastinal silhouette. No aggressive osseous lesion. IMPRESSION: 1. Bilateral diffuse mild interstitial and alveolar airspace disease with 2 nodular areas of airspace disease in left upper lobe and left lower lobe. Small bilateral pleural effusions. Differential considerations include mild pulmonary edema versus multilobar pneumonia. Electronically Signed   By: Kathreen Devoid   On: 10/23/2018 15:38    My personal review of EKG: Rhythm atrial fibrillation   Assessment & Plan:    Active Problems:   Acute respiratory failure ([place])   1. Acute hypoxic respiratory failure-likely from worsening cardiac function from underlying renal dysfunction.  Will hold blood transfusion at this time.  Lasix 60 mg IV x1 given.  I called and discussed with nephrologist on-call Dr. Augustin Coupe who agrees with above management.  He also recommends starting high-dose Lasix every 8 hours.  Will start 60 mg Lasix every 8 hours from tomorrow morning.  2. Acute pulmonary edema-chest x-ray shows interstitial edema,  started on Lasix as above.  Continue oxygen by nasal cannula.  Troponin in the ED mildly elevated 0.06, follow troponin every 6 hours x3.  3. Anemia-hemoglobin is 7.8, 1 unit PRBC was earlier ordered.  At this time I feel that blood transfusion could make pulmonary edema worse.  Nephrology agrees with the plan.  Consider blood transfusion once patient has adequately diuresed with IV Lasix as above.  4. Guaiac positive stool-patient has had back positive stool for  quite some time now, he is on Eliquis.  Will hold Eliquis at this time.  Hemoglobin is stable at 7.8, no active bleed.  Follow CBC in a.m.  5. Acute kidney injury on CKD stage IV-patient has worsening of renal function, his baseline creatinine was around 3 in May.  Now it might have progressed.  As per family his GFR runs usually around 16.  Started on IV Lasix as above.  Will consult nephrology in a.m.  If he does not improve he might need hemodialysis.  6. Atrial fibrillation-heart rate is controlled, continue Cardizem, bisoprolol.  Will hold Eliquis at this time due to guaiac positive stool.  Consider restarting Eliquis based on patient's hemoglobin in a.m.  7. COPD-continue Symbicort, will start Xopenex and ipratropium nebulizer every 6 hours   DVT Prophylaxis-    SCDs   AM Labs Ordered, also please review Full Orders  Family Communication: Admission, patients condition and plan of care including tests being ordered have been discussed with the patient  who indicate understanding and agree with the plan and Code Status.  Code Status: Full code  Admission status: Inpatient: Based on patients clinical presentation and evaluation of above clinical data, I have made determination that patient meets Inpatient criteria at this time.  Patient will need more than 2 midnights, has acute pulmonary edema, worsening renal function.  Requires high-dose IV Lasix, nephrology consulted.  Might require inpatient hemodialysis if does not improve with  medical management  Time spent in minutes : 60 minutes   Oswald Hillock M.D on 10/23/2018 at 8:21 PM  Between 7am to 7pm - Pager - [phone]. After 7pm go to [email or web address] - password [facility]   [facility] - Office  [phone]

## 2018-10-23 NOTE — ED Notes (Signed)
Per Oswald Hillock, MD- do not give blood transfusion until further notice.

## 2018-10-23 NOTE — ED Notes (Signed)
CRITICAL VALUE ALERT  Critical Value:  Troponin 0.06  Date & Time Notied:  10-23-18 1708p  Provider Notified: Melina Copa md  Orders Received/Actions taken: continue to monitor

## 2018-10-23 NOTE — ED Provider Notes (Signed)
[facility] EMERGENCY DEPARTMENT Provider Note   CSN: [id number] Arrival date & time: 10/23/18  1441     History   Chief Complaint Chief Complaint  Patient presents with  . Chest Pain    HPI Don [name] is a 77 y.o. male.  He presents to the emergency department today complaining of acute onset of fatigue along with chills malaise and rapid heart rate with chest discomfort.  He feels all of his symptoms started after he ate some takeout food.  He is feeling somewhat improved today but not back to baseline.  He is got worsening renal function and has a fistula but they have not started doing dialysis yet.  He said he has passed some black stool in the past but is not sure if it is blood or his iron pills.  He is troubled with constipation frequently.  Denies any fever.  Shortness of breath is at baseline.  The history is provided by the patient.  Chest Pain   This is a new problem. The current episode started yesterday. The problem occurs constantly. The problem has been gradually improving. The pain is associated with eating. The pain is present in the substernal region. The pain is moderate. The quality of the pain is described as pressure-like. The pain does not radiate. Associated symptoms include malaise/fatigue. Pertinent negatives include no abdominal pain, no cough, no diaphoresis, no fever, no headaches, no shortness of breath and no vomiting. He has tried rest for the symptoms. The treatment provided mild relief.  His past medical history is significant for arrhythmia.    Past Medical History:  Diagnosis Date  . Acute bronchitis 04/03/2014  . Anemia    low iron  . Anxiety   . Atrial fibrillation ([place])   . Cataract   . COPD (chronic obstructive pulmonary disease) ([place])   . Depression   . Essential hypertension   . Hyperlipidemia   . Macular degeneration   . Noncompliance with medications 04/2013   Xarelto, digoxin previously  . Pre-diabetes   . Prostate cancer  ([place])   . 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 Tubular adenoma of colon 07/31/02, 11/18/03    Patient Active Problem List   Diagnosis Date Noted  . Avulsion of skin of right forearm   . Fall   . Palliative care by specialist   . Goals of care, counseling/discussion   . DNR (do not resuscitate) discussion   . Atrial fibrillation with RVR ([place]) 03/18/2018  . Mass of lower lobe of left lung 02/12/2018  . Depression, recurrent ([place]) 01/02/2018  . Anemia   . Normocytic anemia   . Coagulopathy ([place]) 01/31/2017  . Pancytopenia ([place]) 01/31/2017  . Respiratory distress   . Pre-diabetes 07/23/2015  . Metabolic syndrome 97/35/3299  . Urinary bladder incontinence 03/04/2015  . Hyperlipidemia LDL goal <130 04/04/2014  . Personal history of colonic polyps 04/04/2014  . Heme positive stool 04/03/2014  . History of prostate cancer 04/03/2014  . Stage III chronic kidney disease ([place]) 04/03/2014  . Tobacco abuse 04/25/2013  . Prostate cancer ([place]) 02/08/2011  . COPD (chronic obstructive pulmonary disease) ([place]) 02/08/2011  . Hypertension 02/08/2011    Past Surgical History:  Procedure Laterality Date  . Anal abcess,Hemorroids,    . BASCILIC VEIN TRANSPOSITION Left 10/08/2018   Procedure: FIRST STAGE BASILIC VEIN TRANSPOSITION LEFT ARM;  Surgeon: Angelia Mould, MD;  Location: [facility];  Service: Vascular;  Laterality: Left;  . COLONOSCOPY N/A  04/05/2014   Dr. Fuller Plan: 6 mm sessile polyp from sigmoid colon (tubular adenoma), internal hemorrhoids  . COLONOSCOPY W/ BIOPSIES  11/18/2003   Dr. Earle Gell  . ESOPHAGOGASTRODUODENOSCOPY  11/18/2003   Dr. Earle Gell  . ESOPHAGOGASTRODUODENOSCOPY N/A 04/05/2014   Dr. Fuller Plan: variable Z line, negative Barrett's, small hiatal hernia  . ESOPHAGOGASTRODUODENOSCOPY N/A 02/02/2017   Dr. Oneida Alar: LA Grade B esophagitis, one moderate benign-appearing intrinsic stenosis traversed, small non-bleeding diverticulum in second portion of  duodenum, reactive gastropathy, no H.pylori.  . ESOPHAGOGASTRODUODENOSCOPY N/A 03/22/2018   Procedure: ESOPHAGOGASTRODUODENOSCOPY (EGD);  Surgeon: Daneil Dolin, MD;  [facility];  Service: Endoscopy;  Laterality: N/A;  . GIVENS CAPSULE STUDY N/A 03/22/2018   Procedure: GIVENS CAPSULE STUDY;  Surgeon: Daneil Dolin, MD;  [facility];  Service: Endoscopy;  Laterality: N/A;  . RETROPUBIC PROSTATECTOMY  11/26/2001  . TONSILLECTOMY          Home Medications    Prior to Admission medications   Medication Sig Start Date End Date Taking? Authorizing Provider  acetaminophen (TYLENOL) 500 MG tablet Take 500 mg by mouth every 6 (six) hours as needed for moderate pain.     [provider]  Albuterol Sulfate (PROAIR HFA IN) Inhale 2 puffs into the lungs 2 (two) times daily.     [provider]  apixaban (ELIQUIS) 5 MG TABS tablet Take 1 tablet (5 mg total) by mouth 2 (two) times daily. (Needs to be seen) 08/16/18   Dettinger, Fransisca Kaufmann, MD  atorvastatin (LIPITOR) 20 MG tablet Take 1 tablet (20 mg total) by mouth daily. (Needs to be seen) 08/16/18   Dettinger, Fransisca Kaufmann, MD  bisoprolol (ZEBETA) 10 MG tablet TAKE (1) TABLET TWICE A DAY. 10/18/18   Dettinger, Fransisca Kaufmann, MD  budesonide-formoterol (SYMBICORT) 160-4.5 MCG/ACT inhaler Inhale 2 puffs into the lungs 2 (two) times daily.    [provider]  calcitRIOL (ROCALTROL) 0.25 MCG capsule TAKE (1) CAPSULE DAILY Patient taking differently: Take 0.25 mcg by mouth daily. TAKE (1) CAPSULE DAILY 08/16/18   Dettinger, Fransisca Kaufmann, MD  clonazePAM (KLONOPIN) 1 MG tablet TAKE 1/2 TABLET ONCE DAILY AS NEEDED FOR ANXIETY Patient taking differently: Take 0.5 mg by mouth at bedtime. TAKE 1/2 TABLET ONCE DAILY AS NEEDED FOR ANXIETY 08/16/18   Dettinger, Fransisca Kaufmann, MD  diltiazem (CARDIZEM CD) 240 MG 24 hr capsule TAKE (1) CAPSULE DAILY Patient taking differently: Take 240 mg by mouth daily. TAKE (1) CAPSULE DAILY 08/16/18    Dettinger, Fransisca Kaufmann, MD  ferrous sulfate 325 (65 FE) MG tablet Take 325 mg by mouth every Monday, Wednesday, and Friday.    [provider]  hydroxypropyl methylcellulose / hypromellose (ISOPTO TEARS / GONIOVISC) 2.5 % ophthalmic solution Place 1 drop into both eyes daily as needed for dry eyes.    [provider]  iron polysaccharides (FERREX 150) 150 MG capsule Take 1 capsule (150 mg total) by mouth daily. (Needs to be seen) 08/16/18   Dettinger, Fransisca Kaufmann, MD  loratadine (CLARITIN) 10 MG tablet TAKE 1 TABLET DAILY Patient taking differently: Take 10 mg by mouth daily.  09/18/18   Dettinger, Fransisca Kaufmann, MD  Melatonin 3 MG TABS Take 3 mg by mouth at bedtime.     [provider]  Multiple Vitamin (MULTIVITAMIN WITH MINERALS) TABS tablet Take 1 tablet by mouth daily.    [provider]  Multiple Vitamins-Minerals (PRESERVISION AREDS 2 PO) Take 1 tablet by mouth 2 (two) times daily.  [provider]  omega-3 acid ethyl esters (LOVAZA) 1 g capsule Take 2 g by mouth 2 (two) times daily.    [provider]  oxyCODONE (ROXICODONE) 5 MG immediate release tablet Take 1 tablet (5 mg total) by mouth every 6 (six) hours as needed. 10/08/18   Rhyne, Hulen Shouts, PA-C  pantoprazole (PROTONIX) 40 MG tablet TAKE (1) TABLET TWICE A DAY. Patient taking differently: Take 40 mg by mouth 2 (two) times daily.  09/12/18   Dettinger, Fransisca Kaufmann, MD  terazosin (HYTRIN) 2 MG capsule TAKE 1 CAPSULE AT BEDTIME Patient taking differently: Take 2 mg by mouth at bedtime.  05/20/18   Dettinger, Fransisca Kaufmann, MD  tiotropium (SPIRIVA) 18 MCG inhalation capsule Place 18 mcg into inhaler and inhale daily.    [provider]    Family History Family History  Problem Relation Age of Onset  . Diabetes Father   . Heart disease Father 59       MI  . Heart attack Father   . Heart attack Mother   . Heart disease Brother   . Cancer Brother        lung  . Lung cancer Brother   .  Heart disease Brother   . Lung cancer Sister   . Cancer Sister        lung  . Heart disease Brother   . Heart disease Brother     Social History Social History   Tobacco Use  . Smoking status: Current Some Day Smoker    Packs/day: 0.50    Years: 62.00    Pack years: 31.00    Types: Cigarettes, Cigars    Last attempt to quit: 12/28/2016    Years since quitting: 1.8  . Smokeless tobacco: Never Used  . Tobacco comment: "very little"  Substance Use Topics  . Alcohol use: No  . Drug use: No     Allergies   Wellbutrin [bupropion]   Review of Systems Review of Systems  Constitutional: Positive for chills and malaise/fatigue. Negative for diaphoresis and fever.  HENT: Negative for sore throat.   Eyes: Negative for visual disturbance.  Respiratory: Negative for cough and shortness of breath.   Cardiovascular: Positive for chest pain and leg swelling.  Gastrointestinal: Negative for abdominal pain and vomiting.  Genitourinary: Negative for dysuria.  Musculoskeletal: Negative for neck pain.  Skin: Negative for rash.  Neurological: Negative for headaches.     Physical Exam Updated Vital Signs BP 124/64 (BP Location: Right Arm)   Pulse 71   Temp 98.2 F (36.8 C) (Oral)   Resp 20   Ht 5\' 9"  (1.753 m)   Wt 69.9 kg   SpO2 100%   BMI 22.74 kg/m   Physical Exam  Constitutional: He appears well-developed and well-nourished.  HENT:  Head: Normocephalic and atraumatic.  Eyes: Conjunctivae are normal.  Neck: Neck supple.  Cardiovascular: Normal rate and regular rhythm.  No murmur heard. Pulmonary/Chest: Effort normal. No respiratory distress. He has wheezes.  Abdominal: Soft. There is no tenderness.  Musculoskeletal: Normal range of motion.       Right lower leg: He exhibits edema. He exhibits no tenderness.       Left lower leg: He exhibits edema. He exhibits no tenderness.  Neurological: He is alert. He has normal strength. GCS eye subscore is 4. GCS verbal subscore  is 5. GCS motor subscore is 6.  Skin: Skin is warm and dry. Capillary refill takes less than 2 seconds.  Psychiatric: He  has a normal mood and affect.  Nursing note and vitals reviewed.    ED Treatments / Results  Labs (all labs ordered are listed, but only abnormal results are displayed) Labs Reviewed  BASIC METABOLIC PANEL - Abnormal; Notable for the following components:      Result Value   Chloride 112 (*)    CO2 19 (*)    BUN 75 (*)    Creatinine, Ser 4.39 (*)    Calcium 8.4 (*)    GFR calc non Af Amer 12 (*)    GFR calc Af Amer 14 (*)    All other components within normal limits  CBC - Abnormal; Notable for the following components:   RBC 2.39 (*)    Hemoglobin 7.8 (*)    HCT 25.5 (*)    MCV 106.7 (*)    All other components within normal limits  TROPONIN I - Abnormal; Notable for the following components:   Troponin I 0.06 (*)    All other components within normal limits    EKG EKG Interpretation  Date/Time:  Wednesday October 23 2018 19:11:49 EST Ventricular Rate:  95 PR Interval:    QRS Duration: 97 QT Interval:  356 QTC Calculation: 448 R Axis:   69 Text Interpretation:  Atrial fibrillation Borderline low voltage, extremity leads Baseline wander in lead(s) V2 similar to prior today Confirmed by Aletta Edouard [phone]) on 10/23/2018 7:15:44 PM   Radiology Dg Chest 2 View  Result Date: 10/23/2018 CLINICAL DATA:  Chest pain and weakness EXAM: CHEST - 2 VIEW COMPARISON:  03/19/2018 FINDINGS: Bilateral diffuse mild interstitial and patchy alveolar airspace opacities. Two areas of nodular airspace disease in the left upper lobe and left lower lobe. Small bilateral pleural effusions. No pneumothorax. Stable cardiomediastinal silhouette. No aggressive osseous lesion. IMPRESSION: 1. Bilateral diffuse mild interstitial and alveolar airspace disease with 2 nodular areas of airspace disease in left upper lobe and left lower lobe. Small bilateral pleural effusions.  Differential considerations include mild pulmonary edema versus multilobar pneumonia. Electronically Signed   By: Kathreen Devoid   On: 10/23/2018 15:38    Procedures Procedures (including critical care time)  Medications Ordered in ED Medications  0.9 %  sodium chloride infusion (has no administration in time range)  sodium chloride 0.9 % bolus 500 mL (0 mLs Intravenous Stopped 10/23/18 1904)  ipratropium-albuterol (DUONEB) 0.5-2.5 (3) MG/3ML nebulizer solution 3 mL (3 mLs Nebulization Given 10/23/18 1801)  furosemide (LASIX) injection 60 mg (60 mg Intravenous Given 10/23/18 2003)     Initial Impression / Assessment and Plan / ED Course  I have reviewed the triage vital signs and the nursing notes.  Pertinent labs & imaging results that were available during my care of the patient were reviewed by me and considered in my medical decision making (see chart for details).  Clinical Course as of Oct 23 2013  Wed Oct 23, 2018  1809 Patient's creatinine is slightly worse than blood work done yesterday and clearly worsened 7 months ago with prior labs.  His hemoglobin is also slightly lower and is heme positive from below on anticoagulation.   [MB]  1914 ECG is A. fib rate of 95 no acute ST-T changes.   [MB]  1940 Discussed with Dr. Darrick Meigs from the hospitalist service.  We reviewed the case and felt that we should give the patient a unit of blood and admit him and cycle enzymes.  He will evaluate the patient in the emergency department for admission.   [MB]  Clinical Course User Index [MB] Hayden Rasmussen, MD     Final Clinical Impressions(s) / ED Diagnoses   Final diagnoses:  Nonspecific chest pain  Symptomatic anemia  Chronic kidney disease, unspecified CKD stage  Heme positive stool    ED Discharge Orders    None       Hayden Rasmussen, MD 10/23/18 2015

## 2018-10-23 NOTE — ED Notes (Signed)
Respiratory paged for nebs.

## 2018-10-23 NOTE — ED Notes (Signed)
RESPIRATORY PAGED

## 2018-10-24 DIAGNOSIS — Z720 Tobacco use: Secondary | ICD-10-CM

## 2018-10-24 DIAGNOSIS — D631 Anemia in chronic kidney disease: Secondary | ICD-10-CM

## 2018-10-24 DIAGNOSIS — I5033 Acute on chronic diastolic (congestive) heart failure: Secondary | ICD-10-CM

## 2018-10-24 DIAGNOSIS — J441 Chronic obstructive pulmonary disease with (acute) exacerbation: Secondary | ICD-10-CM

## 2018-10-24 DIAGNOSIS — N184 Chronic kidney disease, stage 4 (severe): Secondary | ICD-10-CM

## 2018-10-24 DIAGNOSIS — I482 Chronic atrial fibrillation, unspecified: Secondary | ICD-10-CM

## 2018-10-24 DIAGNOSIS — E785 Hyperlipidemia, unspecified: Secondary | ICD-10-CM

## 2018-10-24 LAB — CBC
HCT: 22.7 % — ABNORMAL LOW (ref 39.0–52.0)
HEMOGLOBIN: 7.2 g/dL — AB (ref 13.0–17.0)
MCH: 32.9 pg (ref 26.0–34.0)
MCHC: 31.7 g/dL (ref 30.0–36.0)
MCV: 103.7 fL — ABNORMAL HIGH (ref 80.0–100.0)
Platelets: 181 10*3/uL (ref 150–400)
RBC: 2.19 MIL/uL — ABNORMAL LOW (ref 4.22–5.81)
RDW: 14.6 % (ref 11.5–15.5)
WBC: 8.5 10*3/uL (ref 4.0–10.5)
nRBC: 0 % (ref 0.0–0.2)

## 2018-10-24 LAB — COMPREHENSIVE METABOLIC PANEL
ALT: 16 U/L (ref 0–44)
AST: 16 U/L (ref 15–41)
Albumin: 2.6 g/dL — ABNORMAL LOW (ref 3.5–5.0)
Alkaline Phosphatase: 99 U/L (ref 38–126)
Anion gap: 8 (ref 5–15)
BUN: 77 mg/dL — AB (ref 8–23)
CO2: 20 mmol/L — ABNORMAL LOW (ref 22–32)
Calcium: 8.2 mg/dL — ABNORMAL LOW (ref 8.9–10.3)
Chloride: 114 mmol/L — ABNORMAL HIGH (ref 98–111)
Creatinine, Ser: 4.39 mg/dL — ABNORMAL HIGH (ref 0.61–1.24)
GFR calc Af Amer: 14 mL/min — ABNORMAL LOW (ref >60)
GFR, EST NON AFRICAN AMERICAN: 12 mL/min — AB (ref >60)
Glucose, Bld: 105 mg/dL — ABNORMAL HIGH (ref 70–99)
POTASSIUM: 4.9 mmol/L (ref 3.5–5.1)
SODIUM: 142 mmol/L (ref 135–145)
Total Bilirubin: 0.9 mg/dL (ref 0.3–1.2)
Total Protein: 5.1 g/dL — ABNORMAL LOW (ref 6.5–8.1)

## 2018-10-24 LAB — IRON AND TIBC
Iron: 56 ug/dL (ref 45–182)
Saturation Ratios: 22 % (ref 17.9–39.5)
TIBC: 254 ug/dL (ref 250–450)
UIBC: 198 ug/dL

## 2018-10-24 LAB — FERRITIN: Ferritin: 106 ng/mL (ref 24–336)

## 2018-10-24 LAB — TROPONIN I
Troponin I: 0.04 ng/mL (ref <0.03)
Troponin I: 0.03 ng/mL (ref <0.03)

## 2018-10-24 MED ORDER — DM-GUAIFENESIN ER 30-600 MG PO TB12
1.0000 | ORAL_TABLET | Freq: Two times a day (BID) | ORAL | Status: DC
  Administered 2018-10-24 – 2018-10-31 (×12): 1 via ORAL
  Filled 2018-10-24 (×12): qty 1

## 2018-10-24 MED ORDER — NICOTINE 14 MG/24HR TD PT24
14.0000 mg | MEDICATED_PATCH | Freq: Every day | TRANSDERMAL | Status: DC
  Administered 2018-10-24 – 2018-10-31 (×7): 14 mg via TRANSDERMAL
  Filled 2018-10-24 (×7): qty 1

## 2018-10-24 MED ORDER — EPOETIN ALFA 10000 UNIT/ML IJ SOLN
10000.0000 [IU] | INTRAMUSCULAR | Status: DC
  Administered 2018-10-24: 10000 [IU] via SUBCUTANEOUS
  Filled 2018-10-24: qty 1

## 2018-10-24 MED ORDER — FUROSEMIDE 10 MG/ML IJ SOLN
120.0000 mg | Freq: Three times a day (TID) | INTRAVENOUS | Status: DC
  Filled 2018-10-24 (×3): qty 12

## 2018-10-24 MED ORDER — METHYLPREDNISOLONE SODIUM SUCC 40 MG IJ SOLR
40.0000 mg | Freq: Two times a day (BID) | INTRAMUSCULAR | Status: DC
  Administered 2018-10-24 – 2018-10-26 (×4): 40 mg via INTRAVENOUS
  Filled 2018-10-24 (×4): qty 1

## 2018-10-24 MED ORDER — MELATONIN 3 MG PO TABS
3.0000 mg | ORAL_TABLET | Freq: Every evening | ORAL | Status: DC | PRN
  Administered 2018-10-25 – 2018-10-30 (×7): 3 mg via ORAL
  Filled 2018-10-24 (×7): qty 1

## 2018-10-24 MED ORDER — BUDESONIDE 0.5 MG/2ML IN SUSP
0.5000 mg | Freq: Two times a day (BID) | RESPIRATORY_TRACT | Status: DC
  Administered 2018-10-24 – 2018-10-31 (×14): 0.5 mg via RESPIRATORY_TRACT
  Filled 2018-10-24 (×15): qty 2

## 2018-10-24 MED ORDER — NON FORMULARY: 3.0000 mg | Freq: Every evening | Status: DC | PRN

## 2018-10-24 MED ORDER — FUROSEMIDE 10 MG/ML IJ SOLN
80.0000 mg | Freq: Three times a day (TID) | INTRAMUSCULAR | Status: DC
  Administered 2018-10-24 – 2018-10-25 (×2): 80 mg via INTRAVENOUS
  Filled 2018-10-24 (×2): qty 8

## 2018-10-24 NOTE — Progress Notes (Addendum)
PROGRESS NOTE    Don [name]  OHY:[id number] DOB: [birth year] DOA: 10/23/2018 PCP: Dettinger, Fransisca Kaufmann, MD     Brief Narrative:  77 year old male with a past medical history significant for atrial fibrillation on chronic anticoagulation using Eliquis; COPD, tobacco abuse, hypertension, prostate cancer status post prostatectomy and chronic kidney disease stage IV who came to the hospital with complaints of fatigue, worsening shortness of breath, lower extremity swelling and orthopnea.  Patient was found with acute on chronic diastolic heart failure, with acute on chronic renal failure.  Please refer to H&P written by Dr. Darrick Meigs for further info/details on admission.  Assessment & Plan: 1-Acute respiratory failure with hypoxia ([place]): In the setting of fluid overload from worsening renal function, COPD exacerbation and acute on chronic diastolic heart failure. -Remains in the stepdown -Continue oxygen supplementation -Continue Lasix as recommended by nephrologist, using 60 mg every 8 hours. -Follow daily weights and strict intake and output -Low-sodium diet -Close monitoring of his renal function -For COPD we will continue the use of IV steroids, nebulizer, flutter valve and mucinex. -there was no infiltrates seen on x-ray, so will hold on antibiotics.   2-anemia of chronic kidney disease: With component of chronic GI bleed in a patient that is chronically on anticoagulation. -Patient hemoglobin range in the 8.2-8.6 -Currently 7.8 in the setting of hemodilution from fluid overload -Will hold on transfusion for now -Continue holding anticoagulation -Follow hemoglobin trend -Take care of his volume status with diuresis as mentioned above.  3-gastroesophageal reflux disease -Continue PPI  4-atrial fibrillation -Heart rate is controlled -Continue Cardizem and bisoprolol -Holding Eliquis in the setting of positive fecal occult blood test -Continue monitoring on  telemetry.  5-BPH -Continue terazosin -Reports good urine output and there is no complaints of retention  6-acute on chronic renal failure -Nephrology has been consulted -Continue calcitriol -Follow renal function panel -Lasix as mentioned above to try to improve perfusion after having better control of his overall volume status -Follow low-sodium diet -Follow daily weights and strict intake and output  7-hyperlipidemia -Continue statins.  8-flat elevation of troponin -Most likely in the setting of demand ischemia with acute diastolic heart failure on top of chronic renal failure -Currently not having any chest pain -Will check 2D echo.  9-tobacco abuse I have discussed tobacco cessation with the patient.  I have counseled the patient regarding the negative impacts of continued tobacco use including but not limited to lung cancer, COPD, and cardiovascular disease.  I have discussed alternatives to tobacco and modalities that may help facilitate tobacco cessation including but not limited to biofeedback, hypnosis, and medications.  Total time spent with tobacco counseling was 4 minutes. -nicotine patch ordered.  DVT prophylaxis:  Code Status: Full code Family Communication: No family at bedside. Disposition Plan: Still with significant difficulty breathing and unable to speak in full sentences; patient remains inpatient.  Continue IV diuresis and initiate aggressive treatment for component of COPD exacerbation.  Follow renal function closely.  Consultants:   Nephrology service  Procedures:   See below for x-ray report  2D echo: Pending  Antimicrobials:  Anti-infectives (From admission, onward)   None     Subjective: No fever, no chest pain, no nausea, no vomiting.  Sprays still feeling short of breath with minimal exertion.  Patient reported good urine output.  Objective: Vitals:   10/24/18 1000 10/24/18 1007 10/24/18 1100 10/24/18 1334  BP:   (!) 144/79   Pulse:  (!) 115  98   Resp: 12  14   Temp:  97.6 F (36.4 C)    TempSrc:  Oral    SpO2: 96%  100% 100%  Weight:  65.4 kg    Height:  5\' 9"  (1.753 m)      Intake/Output Summary (Last 24 hours) at 10/24/2018 1520 Last data filed at 10/24/2018 1128 Gross per 24 hour  Intake 500 ml  Output 500 ml  Net 0 ml   Filed Weights   10/23/18 1517 10/24/18 1007  Weight: 69.9 kg 65.4 kg    Examination: General exam: Alert, awake, oriented x 3; having difficulty speaking in full sentences and requiring oxygen supplementation to maintain O2 sats above 90%.  Patient expressed being short of breath with minimal exertion. Respiratory system: Diffuse expiratory wheezing, fine crackles at the bases, positive rhonchi right; no using accessory muscles at this moment. Cardiovascular system: Rate controlled, positive systolic ejection murmur, no rubs, no gallops. Gastrointestinal system: Abdomen is nondistended, soft and nontender. No organomegaly or masses felt. Normal bowel sounds heard. Central nervous system: Alert and oriented. No focal neurological deficits.  Patient is able to follow commands appropriately and is moving 4 limbs spontaneously. Extremities: Trace to 1+ edema bilaterally; left upper extremity with [place] in his inner part with new fistula has been placed; there is no signs of superimposed infection, swelling or redness.   Skin: No rashes, no petechiae Psychiatry: Judgement and insight appear normal. Mood & affect appropriate.    Data Reviewed: I have personally reviewed following labs and imaging studies  CBC: Recent Labs  Lab 10/23/18 1602 10/24/18 0340  WBC 7.8 8.5  HGB 7.8* 7.2*  HCT 25.5* 22.7*  MCV 106.7* 103.7*  PLT 202 793   Basic Metabolic Panel: Recent Labs  Lab 10/22/18 1546 10/23/18 1602 10/24/18 0340  NA 142 140 142  K 4.9 5.0 4.9  CL 108* 112* 114*  CO2 19* 19* 20*  GLUCOSE 99 91 105*  BUN 61* 75* 77*  CREATININE 4.07* 4.39* 4.39*  CALCIUM 8.5* 8.4* 8.2*    GFR: Estimated Creatinine Clearance: 12.6 mL/min (A) (by C-G formula based on SCr of 4.39 mg/dL (H)).   Liver Function Tests: Recent Labs  Lab 10/22/18 1546 10/24/18 0340  AST 12 16  ALT 13 16  ALKPHOS 130* 99  BILITOT 0.5 0.9  PROT 5.3* 5.1*  ALBUMIN 3.4* 2.6*   Coagulation Profile: Recent Labs  Lab 10/23/18 1803  INR 1.86   Cardiac Enzymes: Recent Labs  Lab 10/23/18 1602 10/23/18 2206 10/24/18 0340 10/24/18 0934  TROPONINI 0.06* 0.05* 0.04* 0.03*   Urine analysis:    Component Value Date/Time   COLORURINE YELLOW 03/15/2018 2110   APPEARANCEUR HAZY (A) 03/15/2018 2110   LABSPEC 1.013 03/15/2018 2110   PHURINE 5.0 03/15/2018 2110   GLUCOSEU NEGATIVE 03/15/2018 2110   HGBUR NEGATIVE 03/15/2018 2110   BILIRUBINUR NEGATIVE 03/15/2018 2110   BILIRUBINUR neg 10/07/2014 1122   KETONESUR NEGATIVE 03/15/2018 2110   PROTEINUR 100 (A) 03/15/2018 2110   UROBILINOGEN negative 10/07/2014 1122   UROBILINOGEN 0.2 04/03/2014 2308   NITRITE NEGATIVE 03/15/2018 2110   LEUKOCYTESUR NEGATIVE 03/15/2018 2110    Radiology Studies: Dg Chest 2 View  Result Date: 10/23/2018 CLINICAL DATA:  Chest pain and weakness EXAM: CHEST - 2 VIEW COMPARISON:  03/19/2018 FINDINGS: Bilateral diffuse mild interstitial and patchy alveolar airspace opacities. Two areas of nodular airspace disease in the left upper lobe and left lower lobe. Small bilateral pleural effusions. No pneumothorax. Stable cardiomediastinal silhouette. No aggressive osseous lesion. IMPRESSION:  1. Bilateral diffuse mild interstitial and alveolar airspace disease with 2 nodular areas of airspace disease in left upper lobe and left lower lobe. Small bilateral pleural effusions. Differential considerations include mild pulmonary edema versus multilobar pneumonia. Electronically Signed   By: Kathreen Devoid   On: 10/23/2018 15:38    Scheduled Meds: . atorvastatin  20 mg Oral QHS  . bisoprolol  10 mg Oral q morning - 10a  .  budesonide (PULMICORT) nebulizer solution  0.5 mg Nebulization BID  . calcitRIOL  0.25 mcg Oral q morning - 10a  . clonazePAM  0.5 mg Oral QHS  . diltiazem  240 mg Oral q morning - 10a  . furosemide  60 mg Intravenous Q8H  . ipratropium  0.5 mg Nebulization Q6H  . levalbuterol  0.63 mg Nebulization Q6H  . loratadine  10 mg Oral QHS  . methylPREDNISolone (SOLU-MEDROL) injection  40 mg Intravenous Q12H  . nicotine  14 mg Transdermal Daily  . pantoprazole  40 mg Oral BID  . terazosin  2 mg Oral QHS   Continuous Infusions: . sodium chloride       LOS: 1 day    Time spent: 35 minutes. Greater than 50% of this time was spent in direct contact with the patient, coordinating care and discussing relevant ongoing clinical issues, including long discussion about findings suggesting fluid overload/vascular congestion on his chest x-ray along with increased wheezing from COPD exacerbation.  Patient has been educated regarding tobacco abuse and cessation counseling provided.  Which discuss about the status of his kidney function and the chances of further worsening while attempting acute diuresis.  Nephrology services was contacted and the case discussed with attending.    Barton Dubois, MD [facility] Pager [phone]  If 7PM-7AM, please contact night-coverage [email or web address] Password [email or web address] 10/24/2018, 3:20 PM

## 2018-10-24 NOTE — Care Management (Signed)
CM consulted for help with food and possible ALF placement. CSW notified.

## 2018-10-24 NOTE — Consult Note (Signed)
Reason for Consult: AKI/CKD stage 4-5  Referring Physician: Dyann Kief, MD  Don [name] is an 77 y.o. male.  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  He also reported black stools but denies any N/V/D.  In the ED, workup revealed an Hgb of 7.8, hypoxia, CXR with interstitial edema and an elevated BUN/Cr above his baseline.  The trend in Scr is seen below.  He was admitted for further management of his anemia and pulmonary edema.  We have been consulted to help further evaluate and manage his AKI/CKD stage 4-5.  He was started on IV lasix 60 mg q8 hrs but only 500 cc's of UOP documented since admission, though he reports multiple voids with good UOP.    He had been followed by the [facility] for his CKD until he was referred back to Dr. Lowanda Foster in October 2019 due to advanced CKD and need to prepare for RRT.  He underwent placement of a 1st stage left BVT on 10/08/18 by Dr. Scot Dock.  He denies any N/V/D, anorexia, but does have a metallic taste in his mouth.  Overall he feels well just fatigued when he gets up to walk to the bathroom.  He is currently off of oxygen with Pox of 99%.   Trend in Creatinine: Creatinine, Ser  Date/Time Value Ref Range Status  10/24/2018 03:40 AM 4.39 (H) 0.61 - 1.24 mg/dL Final  10/23/2018 04:02 PM 4.39 (H) 0.61 - 1.24 mg/dL Final  10/22/2018 03:46 PM 4.07 (HH) 0.76 - 1.27 mg/dL Final  09/09/2018 3.69 (H)    03/27/2018 04:35 AM 2.97 (H) 0.61 - 1.24 mg/dL Final  03/25/2018 05:12 AM 3.19 (H) 0.61 - 1.24 mg/dL Final  03/24/2018 06:22 AM 3.03 (H) 0.61 - 1.24 mg/dL Final  03/23/2018 04:52 AM 2.91 (H) 0.61 - 1.24 mg/dL Final  03/22/2018 06:51 AM 3.06 (H) 0.61 - 1.24 mg/dL Final  03/21/2018 06:38 AM 3.16 (H) 0.61 - 1.24 mg/dL Final  03/20/2018 06:08 AM 3.19 (H) 0.61 - 1.24 mg/dL  Final  03/19/2018 06:05 AM 3.37 (H) 0.61 - 1.24 mg/dL Final  03/18/2018 08:43 AM 3.32 (H) 0.61 - 1.24 mg/dL Final  03/16/2018 09:15 PM 3.30 (H) 0.61 - 1.24 mg/dL Final  03/15/2018 07:02 PM 3.57 (H) 0.61 - 1.24 mg/dL Final  02/04/2018 05:00 PM 3.60 (H) 0.61 - 1.24 mg/dL Final  07/05/2017 10:46 AM 2.52 (H) 0.76 - 1.27 mg/dL Final  03/01/2017 11:10 AM 2.65 (H) 0.76 - 1.27 mg/dL Final  02/12/2017 03:38 PM 2.94 (H) 0.76 - 1.27 mg/dL Final  02/03/2017 09:06 AM 2.27 (H) 0.61 - 1.24 mg/dL Final  01/31/2017 04:21 PM 2.50 (H) 0.61 - 1.24 mg/dL Final  01/17/2017 05:55 AM 3.20 (H) 0.61 - 1.24 mg/dL Final  01/16/2017 05:21 AM 3.44 (H) 0.61 - 1.24 mg/dL Final  01/15/2017 04:29 AM 3.23 (H) 0.61 - 1.24 mg/dL Final  01/14/2017 04:34 AM 2.92 (H) 0.61 - 1.24 mg/dL Final  01/13/2017 05:40 AM 3.02 (H) 0.61 - 1.24 mg/dL Final  01/13/2017 05:40 AM 3.09 (H) 0.61 - 1.24 mg/dL Final  01/12/2017 04:22 AM 3.35 (H) 0.61 - 1.24 mg/dL Final  01/11/2017 04:41 AM 3.80 (H) 0.61 - 1.24 mg/dL Final  01/10/2017 04:22 AM 3.88 (H) 0.61 - 1.24 mg/dL Final  01/09/2017 04:01 AM 3.90 (H) 0.61 - 1.24 mg/dL Final  01/08/2017  05:05 AM 3.71 (H) 0.61 - 1.24 mg/dL Final  01/07/2017 05:33 AM 3.18 (H) 0.61 - 1.24 mg/dL Final  01/06/2017 04:30 AM 3.16 (H) 0.61 - 1.24 mg/dL Final  01/05/2017 08:46 AM 3.07 (H) 0.61 - 1.24 mg/dL Final  01/05/2016 03:28 PM 2.18 (H) 0.76 - 1.27 mg/dL Final  09/16/2015 09:03 AM 1.89 (H) 0.76 - 1.27 mg/dL Final  06/07/2015 10:07 AM 1.90 (H) 0.76 - 1.27 mg/dL Final  05/03/2015 11:22 AM 1.91 (H) 0.76 - 1.27 mg/dL Final  12/25/2014 11:59 AM 1.88 (H) 0.50 - 1.35 mg/dL Final  07/13/2014 10:54 AM 1.89 (H) 0.76 - 1.27 mg/dL Final  04/08/2014 03:33 AM 1.65 (H) 0.50 - 1.35 mg/dL Final  04/07/2014 04:34 AM 1.55 (H) 0.50 - 1.35 mg/dL Final  04/05/2014 04:18 AM 1.66 (H) 0.50 - 1.35 mg/dL Final  04/04/2014 01:30 AM 1.44 (H) 0.50 - 1.35 mg/dL Final  04/03/2014 09:05 PM 1.47 (H) 0.50 - 1.35 mg/dL Final  03/20/2014 02:45  PM 1.60 (H) 0.50 - 1.35 mg/dL Final  10/16/2013 01:18 PM 1.36 (H) 0.76 - 1.27 mg/dL Final  04/28/2013 04:40 AM 1.33 0.50 - 1.35 mg/dL Final  04/27/2013 04:41 AM 1.32 0.50 - 1.35 mg/dL Final  04/26/2013 05:32 AM 1.26 0.50 - 1.35 mg/dL Final  04/25/2013 09:22 AM 1.34 0.50 - 1.35 mg/dL Final  08/08/2011 02:27 PM 1.30 0.50 - 1.35 mg/dL Final    PMH:   Past Medical History:  Diagnosis Date  . Acute bronchitis 04/03/2014  . Anemia    low iron  . Anxiety   . Atrial fibrillation ([place])   . Cataract   . COPD (chronic obstructive pulmonary disease) ([place])   . Depression   . Essential hypertension   . Hyperlipidemia   . Macular degeneration   . Noncompliance with medications 04/2013   Xarelto, digoxin previously  . Pre-diabetes   . Prostate cancer ([place])   . 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 Tubular adenoma of colon 07/31/02, 11/18/03    PSH:   Past Surgical History:  Procedure Laterality Date  . Anal abcess,Hemorroids,    . BASCILIC VEIN TRANSPOSITION Left 10/08/2018   Procedure: FIRST STAGE BASILIC VEIN TRANSPOSITION LEFT ARM;  Surgeon: Angelia Mould, MD;  Location: [facility] OR;  Service: Vascular;  Laterality: Left;  . COLONOSCOPY N/A 04/05/2014   Dr. Fuller Plan: 6 mm sessile polyp from sigmoid colon (tubular adenoma), internal hemorrhoids  . COLONOSCOPY W/ BIOPSIES  11/18/2003   Dr. Earle Gell  . ESOPHAGOGASTRODUODENOSCOPY  11/18/2003   Dr. Earle Gell  . ESOPHAGOGASTRODUODENOSCOPY N/A 04/05/2014   Dr. Fuller Plan: variable Z line, negative Barrett's, small hiatal hernia  . ESOPHAGOGASTRODUODENOSCOPY N/A 02/02/2017   Dr. Oneida Alar: LA Grade B esophagitis, one moderate benign-appearing intrinsic stenosis traversed, small non-bleeding diverticulum in second portion of duodenum, reactive gastropathy, no H.pylori.  . ESOPHAGOGASTRODUODENOSCOPY N/A 03/22/2018   Procedure: ESOPHAGOGASTRODUODENOSCOPY (EGD);  Surgeon: Daneil Dolin, MD;  Location: [facility];  Service:  Endoscopy;  Laterality: N/A;  . GIVENS CAPSULE STUDY N/A 03/22/2018   Procedure: GIVENS CAPSULE STUDY;  Surgeon: Daneil Dolin, MD;  Location: [facility] END[facility] SUITE;  Service: Endoscopy;  Laterality: N/A;  . RETROPUBIC PROSTATECTOMY  11/26/2001  . TONSILLECTOMY      Allergies:  Allergies  Allergen Reactions  . Wellbutrin [Bupropion] Anxiety    Medications:   Prior to Admission medications   Medication Sig Start Date End Date Taking? Authorizing Provider  acetaminophen (TYLENOL) 500 MG tablet Take 500 mg by mouth  every 6 (six) hours as needed for moderate pain.    Yes [provider]  acitretin (SORIATANE) 10 MG capsule Take 10 mg by mouth daily.    Yes [provider]  albuterol (PROAIR HFA) 108 (90 Base) MCG/ACT inhaler Inhale 2 puffs into the lungs 2 (two) times daily.    Yes [provider]  apixaban (ELIQUIS) 5 MG TABS tablet Take 1 tablet (5 mg total) by mouth 2 (two) times daily. (Needs to be seen) Patient taking differently: Take 5 mg by mouth 2 (two) times daily.  08/16/18  Yes Dettinger, Fransisca Kaufmann, MD  atorvastatin (LIPITOR) 20 MG tablet Take 1 tablet (20 mg total) by mouth daily. (Needs to be seen) Patient taking differently: Take 20 mg by mouth at bedtime.  08/16/18  Yes Dettinger, Fransisca Kaufmann, MD  bisoprolol (ZEBETA) 10 MG tablet TAKE (1) TABLET TWICE A DAY. Patient taking differently: Take 10 mg by mouth every morning.  10/18/18  Yes Dettinger, Fransisca Kaufmann, MD  budesonide-formoterol (SYMBICORT) 160-4.5 MCG/ACT inhaler Inhale 2 puffs into the lungs 2 (two) times daily.   Yes [provider]  calcitRIOL (ROCALTROL) 0.25 MCG capsule TAKE (1) CAPSULE DAILY Patient taking differently: Take 0.25 mcg by mouth every morning.  08/16/18  Yes Dettinger, Fransisca Kaufmann, MD  clonazePAM (KLONOPIN) 1 MG tablet TAKE 1/2 TABLET ONCE DAILY AS NEEDED FOR ANXIETY Patient taking differently: Take 0.5 mg by mouth at bedtime. TAKE 1/2 TABLET ONCE DAILY AS NEEDED FOR ANXIETY 08/16/18   Yes Dettinger, Fransisca Kaufmann, MD  diltiazem (CARDIZEM CD) 240 MG 24 hr capsule TAKE (1) CAPSULE DAILY Patient taking differently: Take 240 mg by mouth every morning.  08/16/18  Yes Dettinger, Fransisca Kaufmann, MD  ferrous sulfate 325 (65 FE) MG tablet Take 325 mg by mouth every Monday, Wednesday, and Friday.   Yes [provider]  hydroxypropyl methylcellulose / hypromellose (ISOPTO TEARS / GONIOVISC) 2.5 % ophthalmic solution Place 1 drop into both eyes daily as needed for dry eyes.   Yes [provider]  iron polysaccharides (FERREX 150) 150 MG capsule Take 1 capsule (150 mg total) by mouth daily. (Needs to be seen) Patient taking differently: Take 150 mg by mouth every morning. (Needs to be seen) 08/16/18  Yes Dettinger, Fransisca Kaufmann, MD  loratadine (CLARITIN) 10 MG tablet TAKE 1 TABLET DAILY Patient taking differently: Take 10 mg by mouth at bedtime.  09/18/18  Yes Dettinger, Fransisca Kaufmann, MD  Melatonin 3 MG TABS Take 3 mg by mouth at bedtime.    Yes [provider]  Multiple Vitamin (MULTIVITAMIN WITH MINERALS) TABS tablet Take 1 tablet by mouth at bedtime.    Yes [provider]  Multiple Vitamins-Minerals (PRESERVISION AREDS 2 PO) Take 1 tablet by mouth 2 (two) times daily.    Yes [provider]  omega-3 acid ethyl esters (LOVAZA) 1 g capsule Take 2 g by mouth 2 (two) times daily.   Yes [provider]  pantoprazole (PROTONIX) 40 MG tablet TAKE (1) TABLET TWICE A DAY. Patient taking differently: Take 40 mg by mouth 2 (two) times daily.  09/12/18  Yes Dettinger, Fransisca Kaufmann, MD  terazosin (HYTRIN) 2 MG capsule TAKE 1 CAPSULE AT BEDTIME Patient taking differently: Take 2 mg by mouth at bedtime.  05/20/18  Yes Dettinger, Fransisca Kaufmann, MD  tiotropium (SPIRIVA) 18 MCG inhalation capsule Place 18 mcg into inhaler and inhale daily as needed (for shortness of breath).    Yes [provider]  oxyCODONE (ROXICODONE) 5  MG immediate release tablet Take 1 tablet (5 mg  total) by mouth every 6 (six) hours as needed. Patient not taking: Reported on 10/23/2018 10/08/18   Gabriel Earing, PA-C    Inpatient medications: . atorvastatin  20 mg Oral QHS  . bisoprolol  10 mg Oral q morning - 10a  . calcitRIOL  0.25 mcg Oral q morning - 10a  . clonazePAM  0.5 mg Oral QHS  . diltiazem  240 mg Oral q morning - 10a  . furosemide  60 mg Intravenous Q8H  . ipratropium  0.5 mg Nebulization Q6H  . levalbuterol  0.63 mg Nebulization Q6H  . loratadine  10 mg Oral QHS  . mometasone-formoterol  2 puff Inhalation BID  . pantoprazole  40 mg Oral BID  . terazosin  2 mg Oral QHS    Discontinued Meds:   Medications Discontinued During This Encounter  Medication Reason  . NON FORMULARY 3 mg   . acitretin (SORIATANE) capsule 10 mg     Social History:  reports that he has been smoking cigarettes and cigars. He has a 31.00 pack-year smoking history. He has never used smokeless tobacco. He reports that he does not drink alcohol or use drugs.  Family History:   Family History  Problem Relation Age of Onset  . Diabetes Father   . Heart disease Father 72       MI  . Heart attack Father   . Heart attack Mother   . Heart disease Brother   . Cancer Brother        lung  . Lung cancer Brother   . Heart disease Brother   . Lung cancer Sister   . Cancer Sister        lung  . Heart disease Brother   . Heart disease Brother     Pertinent items are noted in HPI. Weight change:   Intake/Output Summary (Last 24 hours) at 10/24/2018 1509 Last data filed at 10/24/2018 1128 Gross per 24 hour  Intake 500 ml  Output 500 ml  Net 0 ml   BP (!) 144/79   Pulse 98   Temp 97.6 F (36.4 C) (Oral)   Resp 14   Ht 5\' 9"  (1.753 m)   Wt 65.4 kg   SpO2 100%   BMI 21.29 kg/m  Vitals:   10/24/18 1000 10/24/18 1007 10/24/18 1100 10/24/18 1334  BP:   (!) 144/79   Pulse: (!) 115  98   Resp: 12  14   Temp:  97.6 F (36.4 C)    TempSrc:  Oral    SpO2: 96%  100% 100%   Weight:  65.4 kg    Height:  5\' 9"  (1.753 m)       General appearance: alert, cooperative, fatigued, mild distress and pale Head: Normocephalic, without obvious abnormality, atraumatic Resp: rhonchi bilaterally and wheezes bilaterally and expiratory wheezes Cardio: irregularly irregular rhythm and no rub GI: soft, non-tender; bowel sounds normal; no masses,  no organomegaly Extremities: edema 1+ ankle  and LUE AVF +T/B Neuro:  AA&O x 3, no asterixis  Labs: Basic Metabolic Panel: Recent Labs  Lab 10/22/18 1546 10/23/18 1602 10/24/18 0340  NA 142 140 142  K 4.9 5.0 4.9  CL 108* 112* 114*  CO2 19* 19* 20*  GLUCOSE 99 91 105*  BUN 61* 75* 77*  CREATININE 4.07* 4.39* 4.39*  ALBUMIN 3.4*  --  2.6*  CALCIUM 8.5* 8.4* 8.2*   Liver Function Tests: Recent Labs  Lab 10/22/18 1546 10/24/18 0340  AST 12 16  ALT 13 16  ALKPHOS 130* 99  BILITOT 0.5 0.9  PROT 5.3* 5.1*  ALBUMIN 3.4* 2.6*   No results for input(s): LIPASE, AMYLASE in the last 168 hours. No results for input(s): AMMONIA in the last 168 hours. CBC: Recent Labs  Lab 10/23/18 1602 10/24/18 0340  WBC 7.8 8.5  HGB 7.8* 7.2*  HCT 25.5* 22.7*  MCV 106.7* 103.7*  PLT 202 181   PT/INR: @LABRCNTIP (inr:5) Cardiac Enzymes: ) Recent Labs  Lab 10/23/18 1602 10/23/18 2206 10/24/18 0340 10/24/18 0934  TROPONINI 0.06* 0.05* 0.04* 0.03*   CBG: No results for input(s): GLUCAP in the last 168 hours.  Iron Studies: No results for input(s): IRON, TIBC, TRANSFERRIN, FERRITIN in the last 168 hours.  Xrays/Other Studies: Dg Chest 2 View  Result Date: 10/23/2018 CLINICAL DATA:  Chest pain and weakness EXAM: CHEST - 2 VIEW COMPARISON:  03/19/2018 FINDINGS: Bilateral diffuse mild interstitial and patchy alveolar airspace opacities. Two areas of nodular airspace disease in the left upper lobe and left lower lobe. Small bilateral pleural effusions. No pneumothorax. Stable cardiomediastinal silhouette. No aggressive  osseous lesion. IMPRESSION: 1. Bilateral diffuse mild interstitial and alveolar airspace disease with 2 nodular areas of airspace disease in left upper lobe and left lower lobe. Small bilateral pleural effusions. Differential considerations include mild pulmonary edema versus multilobar pneumonia. Electronically Signed   By: Kathreen Devoid   On: 10/23/2018 15:38     Assessment/Plan: 1.  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  He is without over signs of uremia and appears to be responding to diuretics but need strict I's/O's.  No urgent indication for HD and will continue to follow.  However if his UOP falls or develops uremic symptoms, he will need a temp HD cath and initiate HD. 2. Anemia of CKD and possible acute due to UGI bleed.  Pt reports dark black stools intermittently for the past week.  Follow h/h and transfuse if continues to drop.  Will check iron stores and will start ESA.  Also check stool cards (could be dark from iron supplementation). 3. CHF- ECHO pending.  Possibly due to anemia or progressive CKD but need to evaluate EF. 4. SOB- cxr with nodular opacities possible pulm edema vs multilobar PNA.  Pt does not appear to be very ill.  Would recheck CXR after diuresis and follow.  He may benefit from a blood transfusion 5. A fib- tachy, on eliquis. 6. SHPTH- on calcitriol and will check phos in am.  7. Vascular access- LUE BVT +T/B placed 10/11/18.  Will need HD cath if we have to initiate HD during this admission. 8. Moderate protein malnutrition- follow.   Governor Rooks Nalaysia Manganiello 10/24/2018, 3:09 PM

## 2018-10-25 ENCOUNTER — Inpatient Hospital Stay (HOSPITAL_COMMUNITY): Payer: Medicare HMO

## 2018-10-25 DIAGNOSIS — N185 Chronic kidney disease, stage 5: Secondary | ICD-10-CM

## 2018-10-25 DIAGNOSIS — R0602 Shortness of breath: Secondary | ICD-10-CM

## 2018-10-25 DIAGNOSIS — J9601 Acute respiratory failure with hypoxia: Secondary | ICD-10-CM

## 2018-10-25 DIAGNOSIS — N179 Acute kidney failure, unspecified: Secondary | ICD-10-CM

## 2018-10-25 LAB — RENAL FUNCTION PANEL
Albumin: 2.7 g/dL — ABNORMAL LOW (ref 3.5–5.0)
Anion gap: 11 (ref 5–15)
BUN: 90 mg/dL — ABNORMAL HIGH (ref 8–23)
CO2: 22 mmol/L (ref 22–32)
Calcium: 8.4 mg/dL — ABNORMAL LOW (ref 8.9–10.3)
Chloride: 107 mmol/L (ref 98–111)
Creatinine, Ser: 5.12 mg/dL — ABNORMAL HIGH (ref 0.61–1.24)
GFR calc non Af Amer: 10 mL/min — ABNORMAL LOW (ref >60)
GFR, EST AFRICAN AMERICAN: 11 mL/min — AB (ref >60)
Glucose, Bld: 178 mg/dL — ABNORMAL HIGH (ref 70–99)
Phosphorus: 4.6 mg/dL (ref 2.5–4.6)
Potassium: 4.7 mmol/L (ref 3.5–5.1)
Sodium: 140 mmol/L (ref 135–145)

## 2018-10-25 LAB — CBC
HCT: 25.2 % — ABNORMAL LOW (ref 39.0–52.0)
Hemoglobin: 7.8 g/dL — ABNORMAL LOW (ref 13.0–17.0)
MCH: 31.2 pg (ref 26.0–34.0)
MCHC: 31 g/dL (ref 30.0–36.0)
MCV: 100.8 fL — ABNORMAL HIGH (ref 80.0–100.0)
NRBC: 0 % (ref 0.0–0.2)
Platelets: 208 10*3/uL (ref 150–400)
RBC: 2.5 MIL/uL — ABNORMAL LOW (ref 4.22–5.81)
RDW: 14.4 % (ref 11.5–15.5)
WBC: 6.2 10*3/uL (ref 4.0–10.5)

## 2018-10-25 LAB — ECHOCARDIOGRAM COMPLETE
HEIGHTINCHES: 69 in
Weight: 2218.71 oz

## 2018-10-25 LAB — PREPARE RBC (CROSSMATCH)

## 2018-10-25 LAB — MRSA PCR SCREENING: MRSA BY PCR: POSITIVE — AB

## 2018-10-25 LAB — OCCULT BLOOD X 1 CARD TO LAB, STOOL: Fecal Occult Bld: POSITIVE — AB

## 2018-10-25 MED ORDER — NEPRO/CARBSTEADY PO LIQD
237.0000 mL | Freq: Two times a day (BID) | ORAL | Status: DC
  Administered 2018-10-25 – 2018-10-31 (×9): 237 mL via ORAL

## 2018-10-25 MED ORDER — SODIUM CHLORIDE 0.9% IV SOLUTION
Freq: Once | INTRAVENOUS | Status: AC
  Administered 2018-10-25: 10 mL/h via INTRAVENOUS

## 2018-10-25 MED ORDER — HEPARIN SODIUM (PORCINE) 1000 UNIT/ML DIALYSIS
3100.0000 [IU] | INTRAMUSCULAR | Status: DC | PRN
  Administered 2018-10-25 – 2018-10-28 (×3): 3100 [IU] via INTRAVENOUS_CENTRAL
  Filled 2018-10-25 (×3): qty 4

## 2018-10-25 MED ORDER — CHLORHEXIDINE GLUCONATE CLOTH 2 % EX PADS
6.0000 | MEDICATED_PAD | Freq: Every day | CUTANEOUS | Status: DC
  Administered 2018-10-25 – 2018-10-29 (×2): 6 via TOPICAL

## 2018-10-25 MED ORDER — MUPIROCIN 2 % EX OINT
1.0000 "application " | TOPICAL_OINTMENT | Freq: Two times a day (BID) | CUTANEOUS | Status: AC
  Administered 2018-10-25 – 2018-10-30 (×10): 1 via NASAL
  Filled 2018-10-25 (×3): qty 22

## 2018-10-25 MED ORDER — SODIUM CHLORIDE 0.9 % IV SOLN
250.0000 mg | Freq: Every day | INTRAVENOUS | Status: AC
  Administered 2018-10-25 – 2018-10-28 (×4): 250 mg via INTRAVENOUS
  Filled 2018-10-25 (×4): qty 20

## 2018-10-25 MED ORDER — CHLORHEXIDINE GLUCONATE CLOTH 2 % EX PADS
6.0000 | MEDICATED_PAD | Freq: Every day | CUTANEOUS | Status: AC
  Administered 2018-10-25 – 2018-10-28 (×4): 6 via TOPICAL

## 2018-10-25 MED ORDER — FUROSEMIDE 10 MG/ML IJ SOLN: 40.0000 mg | Freq: Every day | INTRAMUSCULAR | Status: DC

## 2018-10-25 MED ORDER — HEPARIN SODIUM (PORCINE) 1000 UNIT/ML DIALYSIS: 1000.0000 [IU] | INTRAMUSCULAR | Status: DC | PRN

## 2018-10-25 NOTE — Care Management Important Message (Signed)
Important Message  Patient Details  Name: Don [name] MRN: [id number] Date of Birth: [birth year]   Medicare Important Message Given:  Yes    Sherald Barge, RN 10/25/2018, 1:50 PM

## 2018-10-25 NOTE — Clinical Social Work Note (Signed)
Initial paperwork for new dialysis start was faxed to [facility].    Icyss Skog, Clydene Pugh, LCSW

## 2018-10-25 NOTE — Procedures (Signed)
Procedure Note  10/25/18   Preoperative Diagnosis: Acute on chronic renal failure Stage 4-5, Congestive heart failure    Postoperative Diagnosis: Same   Procedure(s) Performed: Dialysis access placement, right femoral vein with ultrasound guidance    Surgeon: Lanell Matar. Constance Haw, MD   Assistants: None   Anesthesia: 1% lidocaine    Complications: None    Indications: Mr. Mccree is a 77 y.o. with acute on chronic renal disease who presented to the hospital with hypoxia and congestive heart failure and has been getting medical treatment and given his renal failure and uremic symptoms per nephrology he will need to start dialysis tomorrow.  He has been on Eliquis, last dose 12/11, and IR will not place a tunneled line until next week. Given this a temporary dialysis catheter will be placed. I discussed the risk and benefits of placement of the central line with him, including but not limited to bleeding, infection, and risk of injury to the artery, and fact that this is a temporary line, he has given verbal consent for the procedure.   We notified his POA, stepson, [name], who did not have any further questions.   Procedure: The patient placed supine. The right groin was checked with an ultrasound and a large patent vein was noted.  The right groin was prepped and draped in the usual sterile fashion.  Wearing full gown and gloves, I performed the procedure.  One percent lidocaine was used for local anesthesia. An ultrasound was utilized to assess the femoral vein.  The needle with syringe was advanced into the subclavian vein with dark venous return, and a wire was placed using the Seldinger technique without difficulty.  The ultrasound again confirmed that the wire entered the vein and not the artery as the artery did line directly on top of the vein.  The skin was knicked and a dilator was placed, and the hemodialysis catheter was placed over the wire with continued control of the wire.  There was  good draw back of blood from all lumens and each flushed easily with saline.  The catheter was secured in 2 points with 2-0 silk and a biopatch and dressing was placed.     The patient tolerated the procedure well. The RN will notify the HD team of the line and will flush with heparin if this is desired by nephrology and the HD team.    Curlene Labrum, MD [facility] [address] [place], [place] [address] [phone] (office)

## 2018-10-25 NOTE — Progress Notes (Signed)
PROGRESS NOTE    Don [name]  IOX:[id number] DOB: 1941-06-01 DOA: 10/23/2018 PCP: Dettinger, Fransisca Kaufmann, MD     Brief Narrative:  77 year old male with a past medical history significant for atrial fibrillation on chronic anticoagulation using Eliquis; COPD, tobacco abuse, hypertension, prostate cancer status post prostatectomy and chronic kidney disease stage IV who came to the hospital with complaints of fatigue, worsening shortness of breath, lower extremity swelling and orthopnea.  Patient was found with acute on chronic diastolic heart failure, with acute on chronic renal failure.  Please refer to H&P written by Dr. Darrick Meigs for further info/details on admission.  Assessment & Plan: 1-Acute respiratory failure with hypoxia ([place]): In the setting of fluid overload from worsening renal function, COPD exacerbation and acute on chronic diastolic heart failure. -will transfer to telemetry bed.  -Continue oxygen supplementation as needed; currently with good O2 sat on RA> -Continue Lasix; but will adjust dose in the setting of worsening Cr and elevation of BUN volume status improved.  -Follow daily weights and strict intake and output -continue Low-sodium diet -will closely monitor his renal function -For his COPD will continue IV steroids, nebulizer, flutter valve and mucinex. -there is no fever or frank infiltrates on CXR; continue holding on antibiotics.   2-anemia of chronic kidney disease: With component of acute on chronic GI bleed in a patient that is chronically on anticoagulation. -Patient hemoglobin range in the 8.2-8.6 -Currently 7.8; but today expressed feeling lightheaded and HR is up. -will transfuse 1 unit of PRBC -follow trend -Epogen ordered by nephrology; might benefit of IV iron as well. -Continue holding anticoagulation  3-gastroesophageal reflux disease -will continue PPI  4-atrial fibrillation -Heart rate is fairly controlled -Continue Cardizem and  bisoprolol -continue Holding Eliquis in the setting of positive fecal occult blood test -Continue monitoring on telemetry.  5-BPH -Continue terazosin -Reports good urine output and there is no complaints of urine retention  6-acute on chronic renal failure: stage 4-5  -Nephrology has been consulted and will follow rec's -no frank indication for HD -Continue calcitriol -Follow renal function panel -Lasix dose will be adjusted, as patient volume improved and renal function has worsen today. -continue low-sodium diet -Follow daily weights and strict intake and output  7-hyperlipidemia -Will continue statins.  8-flat elevation of troponin -Most likely in the setting of demand ischemia with acute diastolic heart failure on top of chronic renal failure -Currently not having any chest pain -Will follow 2D echo reports.  9-tobacco abuse -Extensive cessation counseling has been provided on 10/24/2018 -Continue nicotine patch.    DVT prophylaxis: SCD's Code Status: Full code Family Communication: No family at bedside. Disposition Plan: Still short of breath with minimal exertion, overall with some improvement in his breathing and not requiring oxygen supplementation.  Feeling lightheaded and weak.  Repeat fecal occult blood test was positive.  Hemoglobin 7.8.  Renal function has worsened and BUN has gone up.  Consultants:   Nephrology service  Procedures:   See below for x-ray report  2D echo: Pending  Antimicrobials:  Anti-infectives (From admission, onward)   None     Subjective: No fever, no chest pain, no nausea, no vomiting.  Patient expressed feeling lightheaded and weak.  Still short of breath with minimal exertion.  No requiring oxygen supplementation at this time.  Objective: Vitals:   10/25/18 0500 10/25/18 0600 10/25/18 0748 10/25/18 0800  BP: 120/62 124/81  (!) 135/92  Pulse: 94 (!) 115 (!) 122 (!) 112  Resp: 18 18 17  19  Temp:   98 F (36.7 C)   TempSrc:    Oral   SpO2: 98% 97% 96% 97%  Weight: 62.9 kg     Height:        Intake/Output Summary (Last 24 hours) at 10/25/2018 0852 Last data filed at 10/25/2018 0405 Gross per 24 hour  Intake 540 ml  Output 1750 ml  Net -1210 ml   Filed Weights   10/23/18 1517 10/24/18 1007 10/25/18 0500  Weight: 69.9 kg 65.4 kg 62.9 kg    Examination: General exam: Alert, awake, oriented x 3; no requiring oxygen supplementation at this time.  Continues to feel short of breath with minimal exertion and expressed feeling lightheaded and weak. Respiratory system: Positive expiratory wheezing, scattered rhonchi; no using accessory muscles.  Patient with decreased breath sounds at the bases but no frank crackles appreciated. Cardiovascular system: Irregular, no rubs, no gallops, soft systolic ejection murmur on exam; no frank JVD.  Gastrointestinal system: Abdomen is nondistended, soft and nontender. No organomegaly or masses felt. Normal bowel sounds heard. Central nervous system: Alert and oriented. No focal neurological deficits. Extremities: No cyanosis, no clubbing, no edema.  Good bruits appreciated on his left upper arm fistula. Skin: No rashes, lesions or ulcers Psychiatry: Judgement and insight appear normal. Mood & affect appropriate.   Data Reviewed: I have personally reviewed following labs and imaging studies  CBC: Recent Labs  Lab 10/23/18 1602 10/24/18 0340 10/25/18 0409  WBC 7.8 8.5 6.2  HGB 7.8* 7.2* 7.8*  HCT 25.5* 22.7* 25.2*  MCV 106.7* 103.7* 100.8*  PLT 202 181 638   Basic Metabolic Panel: Recent Labs  Lab 10/22/18 1546 10/23/18 1602 10/24/18 0340 10/25/18 0409  NA 142 140 142 140  K 4.9 5.0 4.9 4.7  CL 108* 112* 114* 107  CO2 19* 19* 20* 22  GLUCOSE 99 91 105* 178*  BUN 61* 75* 77* 90*  CREATININE 4.07* 4.39* 4.39* 5.12*  CALCIUM 8.5* 8.4* 8.2* 8.4*  PHOS  --   --   --  4.6   GFR: Estimated Creatinine Clearance: 10.4 mL/min (A) (by C-G formula based on SCr of  5.12 mg/dL (H)).   Liver Function Tests: Recent Labs  Lab 10/22/18 1546 10/24/18 0340 10/25/18 0409  AST 12 16  --   ALT 13 16  --   ALKPHOS 130* 99  --   BILITOT 0.5 0.9  --   PROT 5.3* 5.1*  --   ALBUMIN 3.4* 2.6* 2.7*   Coagulation Profile: Recent Labs  Lab 10/23/18 1803  INR 1.86   Cardiac Enzymes: Recent Labs  Lab 10/23/18 1602 10/23/18 2206 10/24/18 0340 10/24/18 0934  TROPONINI 0.06* 0.05* 0.04* 0.03*   Urine analysis:    Component Value Date/Time   COLORURINE YELLOW 03/15/2018 2110   APPEARANCEUR HAZY (A) 03/15/2018 2110   LABSPEC 1.013 03/15/2018 2110   PHURINE 5.0 03/15/2018 2110   GLUCOSEU NEGATIVE 03/15/2018 2110   HGBUR NEGATIVE 03/15/2018 2110   BILIRUBINUR NEGATIVE 03/15/2018 2110   BILIRUBINUR neg 10/07/2014 1122   KETONESUR NEGATIVE 03/15/2018 2110   PROTEINUR 100 (A) 03/15/2018 2110   UROBILINOGEN negative 10/07/2014 1122   UROBILINOGEN 0.2 04/03/2014 2308   NITRITE NEGATIVE 03/15/2018 2110   LEUKOCYTESUR NEGATIVE 03/15/2018 2110    Radiology Studies: Dg Chest 2 View  Result Date: 10/23/2018 CLINICAL DATA:  Chest pain and weakness EXAM: CHEST - 2 VIEW COMPARISON:  03/19/2018 FINDINGS: Bilateral diffuse mild interstitial and patchy alveolar airspace opacities. Two areas of nodular  airspace disease in the left upper lobe and left lower lobe. Small bilateral pleural effusions. No pneumothorax. Stable cardiomediastinal silhouette. No aggressive osseous lesion. IMPRESSION: 1. Bilateral diffuse mild interstitial and alveolar airspace disease with 2 nodular areas of airspace disease in left upper lobe and left lower lobe. Small bilateral pleural effusions. Differential considerations include mild pulmonary edema versus multilobar pneumonia. Electronically Signed   By: Kathreen Devoid   On: 10/23/2018 15:38    Scheduled Meds: . sodium chloride   Intravenous Once  . atorvastatin  20 mg Oral QHS  . bisoprolol  10 mg Oral q morning - 10a  . budesonide  (PULMICORT) nebulizer solution  0.5 mg Nebulization BID  . calcitRIOL  0.25 mcg Oral q morning - 10a  . Chlorhexidine Gluconate Cloth  6 each Topical Q0600  . clonazePAM  0.5 mg Oral QHS  . dextromethorphan-guaiFENesin  1 tablet Oral BID  . diltiazem  240 mg Oral q morning - 10a  . epoetin (EPOGEN/PROCRIT) injection  10,000 Units Subcutaneous Q14 Days  . feeding supplement (NEPRO CARB STEADY)  237 mL Oral BID BM  . furosemide  40 mg Intravenous Daily  . ipratropium  0.5 mg Nebulization Q6H  . levalbuterol  0.63 mg Nebulization Q6H  . loratadine  10 mg Oral QHS  . methylPREDNISolone (SOLU-MEDROL) injection  40 mg Intravenous Q12H  . mupirocin ointment  1 application Nasal BID  . nicotine  14 mg Transdermal Daily  . pantoprazole  40 mg Oral BID  . terazosin  2 mg Oral QHS   Continuous Infusions: . sodium chloride       LOS: 2 days    Time spent: 35 minutes.  Greater than 50% of the time was spent in direct contact with the patient, coordinating care and discussing relevant ongoing clinical issues, including discussion about findings of worsening renal function and elevation of BUN; patient hemoglobin remains low and given symptoms of slight tachycardia and lightheadedness he will be transfused to further stabilize his condition.  Patient is still having some ongoing wheezing and SOB on exertion. Appreciate assistance and recommendations by renal service.    Barton Dubois, MD [facility] Pager [phone]  If 7PM-7AM, please contact night-coverage [email or web address] Password [facility] 10/25/2018, 8:52 AM

## 2018-10-25 NOTE — NC FL2 (Signed)
[place] LEVEL OF CARE SCREENING TOOL     IDENTIFICATION  Patient Name: Don [name] Birthdate: 01-01-1941 Sex: male Admission Date (Current Location): 10/23/2018  [facility] and [place] Number:  [employer] and Address:  [facility] [address] [place]      Provider Number: [id number]  Attending Physician Name and Address:  Barton Dubois, MD  Relative Name and Phone Number:       Current Level of Care: Hospital Recommended Level of Care: [place] Prior Approval Number:    Date Approved/Denied:   PASRR Number:    Discharge Plan: Domiciliary (Rest home)(ALF)    Current Diagnoses: Patient Active Problem List   Diagnosis Date Noted  . Acute respiratory failure ([place]) 10/23/2018  . Avulsion of skin of right forearm   . Fall   . Palliative care by specialist   . Goals of care, counseling/discussion   . DNR (do not resuscitate) discussion   . Atrial fibrillation with RVR ([place]) 03/18/2018  . Mass of lower lobe of left lung 02/12/2018  . Depression, recurrent ([place]) 01/02/2018  . Anemia   . Normocytic anemia   . Coagulopathy ([place]) 01/31/2017  . Pancytopenia ([place]) 01/31/2017  . Respiratory distress   . Pre-diabetes 07/23/2015  . Metabolic syndrome 12/45/8099  . Urinary bladder incontinence 03/04/2015  . Hyperlipidemia LDL goal <130 04/04/2014  . Personal history of colonic polyps 04/04/2014  . Heme positive stool 04/03/2014  . History of prostate cancer 04/03/2014  . Stage III chronic kidney disease ([place]) 04/03/2014  . Tobacco abuse 04/25/2013  . Prostate cancer ([place]) 02/08/2011  . COPD (chronic obstructive pulmonary disease) ([place]) 02/08/2011  . Hypertension 02/08/2011    Orientation RESPIRATION BLADDER Height & Weight     Self, Time, Situation, Place  Normal Continent Weight: 138 lb 10.7 oz (62.9 kg) Height:  5\' 9"  (175.3 cm)  BEHAVIORAL SYMPTOMS/MOOD NEUROLOGICAL BOWEL NUTRITION STATUS    Continent Diet(heart healthy)  AMBULATORY STATUS COMMUNICATION OF NEEDS Skin   Limited Assist Verbally Normal                       Personal Care Assistance Level of Assistance  Bathing, Feeding, Dressing Bathing Assistance: Limited assistance Feeding assistance: Independent Dressing Assistance: Limited assistance     Functional Limitations Info  Sight, Hearing, Speech Sight Info: Adequate Hearing Info: Adequate Speech Info: Adequate    SPECIAL CARE FACTORS FREQUENCY                       Contractures Contractures Info: Not present    Additional Factors Info  Code Status, Allergies, Psychotropic Code Status Info: Full Code Allergies Info: Wellbutrin Psychotropic Info: Klonopin         Current Medications (10/25/2018):  This is the current hospital active medication list Current Facility-Administered Medications  Medication Dose Route Frequency Provider Last Rate Last Dose  . 0.9 %  sodium chloride infusion (Manually program via Guardrails IV Fluids)   Intravenous Once Barton Dubois, MD      . 0.9 %  sodium chloride infusion  10 mL/hr Intravenous Once [place], Marge Duncans, MD      . acetaminophen (TYLENOL) tablet 500 mg  500 mg Oral Q6H PRN Oswald Hillock, MD      . atorvastatin (LIPITOR) tablet 20 mg  20 mg Oral QHS Oswald Hillock, MD   20 mg at 10/24/18 2149  . bisoprolol (ZEBETA) tablet  10 mg  10 mg Oral q morning - 10a Oswald Hillock, MD   10 mg at 10/25/18 0956  . budesonide (PULMICORT) nebulizer solution 0.5 mg  0.5 mg Nebulization BID Barton Dubois, MD   0.5 mg at 10/25/18 0900  . calcitRIOL (ROCALTROL) capsule 0.25 mcg  0.25 mcg Oral q morning - 10a Darrick Meigs, Marge Duncans, MD   0.25 mcg at 10/25/18 0957  . Chlorhexidine Gluconate Cloth 2 % PADS 6 each  6 each Topical Q0600 Barton Dubois, MD   6 each at 10/25/18 [phone]  . clonazePAM (KLONOPIN) tablet 0.5 mg  0.5 mg Oral QHS Oswald Hillock, MD   0.5 mg at 10/24/18 2150  . dextromethorphan-guaiFENesin (MUCINEX DM) 30-600 MG  per 12 hr tablet 1 tablet  1 tablet Oral BID Barton Dubois, MD   1 tablet at 10/25/18 0957  . diltiazem (CARDIZEM CD) 24 hr capsule 240 mg  240 mg Oral q morning - 10a Oswald Hillock, MD   240 mg at 10/25/18 0957  . epoetin alfa (EPOGEN,PROCRIT) injection 10,000 Units  10,000 Units Subcutaneous Q14 Days Donato Heinz, MD   10,000 Units at 10/24/18 1641  . feeding supplement (NEPRO CARB STEADY) liquid 237 mL  237 mL Oral BID BM Barton Dubois, MD      . furosemide (LASIX) injection 40 mg  40 mg Intravenous Daily Barton Dubois, MD      . ipratropium (ATROVENT) nebulizer solution 0.5 mg  0.5 mg Nebulization Q6H Oswald Hillock, MD   0.5 mg at 10/25/18 0855  . levalbuterol (XOPENEX) nebulizer solution 0.63 mg  0.63 mg Nebulization Q6H Oswald Hillock, MD   0.63 mg at 10/25/18 0854  . loratadine (CLARITIN) tablet 10 mg  10 mg Oral QHS Oswald Hillock, MD   10 mg at 10/24/18 2149  . Melatonin TABS 3 mg  3 mg Oral QHS PRN Barton Dubois, MD   3 mg at 10/25/18 0031  . methylPREDNISolone sodium succinate (SOLU-MEDROL) 40 mg/mL injection 40 mg  40 mg Intravenous Q12H Barton Dubois, MD   40 mg at 10/25/18 0359  . mupirocin ointment (BACTROBAN) 2 % 1 application  1 application Nasal BID Barton Dubois, MD      . nicotine (NICODERM CQ - dosed in mg/24 hours) patch 14 mg  14 mg Transdermal Daily Barton Dubois, MD   14 mg at 10/25/18 0957  . ondansetron (ZOFRAN) tablet 4 mg  4 mg Oral Q6H PRN Oswald Hillock, MD       Or  . ondansetron (ZOFRAN) injection 4 mg  4 mg Intravenous Q6H PRN Oswald Hillock, MD      . pantoprazole (PROTONIX) EC tablet 40 mg  40 mg Oral BID Oswald Hillock, MD   40 mg at 10/25/18 0957  . polyvinyl alcohol (LIQUIFILM TEARS) 1.4 % ophthalmic solution 1 drop  1 drop Both Eyes Daily PRN Oswald Hillock, MD   1 drop at 10/24/18 2152  . terazosin (HYTRIN) capsule 2 mg  2 mg Oral QHS Oswald Hillock, MD   2 mg at 10/24/18 2150     Discharge Medications: Please see discharge summary for a list of  discharge medications.  Relevant Imaging Results:  Relevant Lab Results:   Additional Information SSN [id number].   Josalyn Dettmann, Clydene Pugh, LCSW

## 2018-10-25 NOTE — NC FL2 (Signed)
[place] LEVEL OF CARE SCREENING TOOL     IDENTIFICATION  Patient Name: Don [name] Birthdate: 06/11/1941 Sex: male Admission Date (Current Location): 10/23/2018  [facility] and [place] Number:  [employer] and Address:  [facility] [address] [place]      Provider Number: [id number]  Attending Physician Name and Address:  Barton Dubois, MD  Relative Name and Phone Number:       Current Level of Care: Hospital Recommended Level of Care: [place] Prior Approval Number:    Date Approved/Denied:   PASRR Number:    Discharge Plan: SNF    Current Diagnoses: Patient Active Problem List   Diagnosis Date Noted  . Acute respiratory failure ([place]) 10/23/2018  . Avulsion of skin of right forearm   . Fall   . Palliative care by specialist   . Goals of care, counseling/discussion   . DNR (do not resuscitate) discussion   . Atrial fibrillation with RVR ([place]) 03/18/2018  . Mass of lower lobe of left lung 02/12/2018  . Depression, recurrent ([place]) 01/02/2018  . Anemia   . Normocytic anemia   . Coagulopathy ([place]) 01/31/2017  . Pancytopenia ([place]) 01/31/2017  . Respiratory distress   . Pre-diabetes 07/23/2015  . Metabolic syndrome 78/58/8502  . Urinary bladder incontinence 03/04/2015  . Hyperlipidemia LDL goal <130 04/04/2014  . Personal history of colonic polyps 04/04/2014  . Heme positive stool 04/03/2014  . History of prostate cancer 04/03/2014  . Stage III chronic kidney disease ([place]) 04/03/2014  . Tobacco abuse 04/25/2013  . Prostate cancer ([place]) 02/08/2011  . COPD (chronic obstructive pulmonary disease) ([place]) 02/08/2011  . Hypertension 02/08/2011    Orientation RESPIRATION BLADDER Height & Weight     Self, Time, Situation, Place  Normal Continent Weight: 138 lb 10.7 oz (62.9 kg) Height:  5\' 9"  (175.3 cm)  BEHAVIORAL SYMPTOMS/MOOD NEUROLOGICAL BOWEL NUTRITION STATUS      Continent Diet(heart  healthy)  AMBULATORY STATUS COMMUNICATION OF NEEDS Skin   Limited Assist Verbally Normal                       Personal Care Assistance Level of Assistance  Bathing, Feeding, Dressing Bathing Assistance: Limited assistance Feeding assistance: Independent Dressing Assistance: Limited assistance     Functional Limitations Info  Sight, Hearing, Speech Sight Info: Adequate Hearing Info: Adequate Speech Info: Adequate    SPECIAL CARE FACTORS FREQUENCY                       Contractures Contractures Info: Not present    Additional Factors Info  Code Status, Allergies, Psychotropic Code Status Info: Full Code Allergies Info: Wellbutrin Psychotropic Info: Klonopin         Current Medications (10/25/2018):  This is the current hospital active medication list Current Facility-Administered Medications  Medication Dose Route Frequency Provider Last Rate Last Dose  . 0.9 %  sodium chloride infusion  10 mL/hr Intravenous Once Barton Dubois, MD      . acetaminophen (TYLENOL) tablet 500 mg  500 mg Oral Q6H PRN Barton Dubois, MD      . atorvastatin (LIPITOR) tablet 20 mg  20 mg Oral QHS Barton Dubois, MD   20 mg at 10/24/18 2149  . bisoprolol (ZEBETA) tablet 10 mg  10 mg Oral q morning - 10a Barton Dubois, MD   10 mg at 10/25/18 0956  . budesonide (PULMICORT) nebulizer solution 0.5  mg  0.5 mg Nebulization BID Barton Dubois, MD   0.5 mg at 10/25/18 0900  . calcitRIOL (ROCALTROL) capsule 0.25 mcg  0.25 mcg Oral q morning - 10a Barton Dubois, MD   0.25 mcg at 10/25/18 0957  . Chlorhexidine Gluconate Cloth 2 % PADS 6 each  6 each Topical Q0600 Barton Dubois, MD   6 each at 10/25/18 [phone]  . Chlorhexidine Gluconate Cloth 2 % PADS 6 each  6 each Topical Q0600 Corliss Parish, MD      . clonazePAM [facility] [place]) tablet 0.5 mg  0.5 mg Oral QHS Barton Dubois, MD   0.5 mg at 10/24/18 2150  . dextromethorphan-guaiFENesin (MUCINEX DM) 30-600 MG per 12 hr tablet 1 tablet  1 tablet  Oral BID Barton Dubois, MD   1 tablet at 10/25/18 0957  . diltiazem (CARDIZEM CD) 24 hr capsule 240 mg  240 mg Oral q morning - 10a Barton Dubois, MD   240 mg at 10/25/18 0957  . epoetin alfa (EPOGEN,PROCRIT) injection 10,000 Units  10,000 Units Subcutaneous Q14 Days Barton Dubois, MD   10,000 Units at 10/24/18 1641  . feeding supplement (NEPRO CARB STEADY) liquid 237 mL  237 mL Oral BID BM Barton Dubois, MD   237 mL at 10/25/18 1112  . ferric gluconate (NULECIT) 250 mg in sodium chloride 0.9 % 100 mL IVPB  250 mg Intravenous Daily Corliss Parish, MD 120 mL/hr at 10/25/18 1324    . ipratropium (ATROVENT) nebulizer solution 0.5 mg  0.5 mg Nebulization Q6H Barton Dubois, MD   0.5 mg at 10/25/18 0855  . levalbuterol (XOPENEX) nebulizer solution 0.63 mg  0.63 mg Nebulization Q6H Barton Dubois, MD   0.63 mg at 10/25/18 0854  . loratadine (CLARITIN) tablet 10 mg  10 mg Oral QHS Barton Dubois, MD   10 mg at 10/24/18 2149  . Melatonin TABS 3 mg  3 mg Oral QHS PRN Barton Dubois, MD   3 mg at 10/25/18 0031  . methylPREDNISolone sodium succinate (SOLU-MEDROL) 40 mg/mL injection 40 mg  40 mg Intravenous Q12H Barton Dubois, MD   40 mg at 10/25/18 0359  . mupirocin ointment (BACTROBAN) 2 % 1 application  1 application Nasal BID Barton Dubois, MD   1 application at 93/79/02 1114  . nicotine (NICODERM CQ - dosed in mg/24 hours) patch 14 mg  14 mg Transdermal Daily Barton Dubois, MD   14 mg at 10/25/18 0957  . ondansetron (ZOFRAN) tablet 4 mg  4 mg Oral Q6H PRN Barton Dubois, MD       Or  . ondansetron [facility]) injection 4 mg  4 mg Intravenous Q6H PRN Barton Dubois, MD      . pantoprazole (PROTONIX) EC tablet 40 mg  40 mg Oral BID Barton Dubois, MD   40 mg at 10/25/18 0957  . polyvinyl alcohol (LIQUIFILM TEARS) 1.4 % ophthalmic solution 1 drop  1 drop Both Eyes Daily PRN Barton Dubois, MD   1 drop at 10/24/18 2152  . terazosin (HYTRIN) capsule 2 mg  2 mg Oral QHS Barton Dubois, MD   2 mg at  10/24/18 2150     Discharge Medications: Please see discharge summary for a list of discharge medications.  Relevant Imaging Results:  Relevant Lab Results:   Additional Information SSN [id number]. Patient will be a new hemo-dialysis start during this current admission.   Garnetta Fedrick, Clydene Pugh, LCSW

## 2018-10-25 NOTE — Progress Notes (Signed)
Dialysis catheter to be flushed with heparin per nephrology protocol per Dr. Constance Haw. Requested nursing staff check with dialysis nurse regarding protocol. Notified Rockwell Alexandria, RN with dialysis, stated it should be flushed per protocol and order would be placed. Stated dialysis catheter could be flushed by nursing staff. Flushed per orders by Estell Harpin, RN. Notified Dr. Constance Haw. Donavan Foil, RN

## 2018-10-25 NOTE — Clinical Social Work Note (Signed)
Clinical Social Work Assessment  Patient Details  Name: Don [name] MRN: [id number] Date of Birth: 1941-08-10  Date of referral:  10/25/18               Reason for consult:  Discharge Planning, Facility Placement, Other (Comment Required)(dialysis)                Permission sought to share information with:  Chartered certified accountant granted to share information::  Yes, Verbal Permission Granted  Name::        Agency::  [facility]  Relationship::     Contact Information:     Housing/Transportation Living arrangements for the past 2 months:  [place] of Information:  Patient Patient Interpreter Needed:  None Criminal Activity/Legal Involvement Pertinent to Current Situation/Hospitalization:  No - Comment as needed Significant Relationships:  Adult Children Lives with:  Self Do you feel safe going back to the place where you live?  No Need for family participation in patient care:  Yes (Comment)  Care giving concerns: Pt indicating that he can no longer live alone.   Social Worker assessment / plan: Pt is a 77 year old male referred to [facility] for dc planning needs. Pt was discussed in [facility] today. At this time, hemo dialysis is being initiated and patient will need an outpatient schedule for dc. Met with pt this AM to assess. Per pt, he has been living alone at home. His step son was doing pt's shopping and driving him places but pt was independent in other ADL's. Pt informs this LCSW that he wants to go to [facility] at [employer]. PT has not yet assessed but MD anticipating pt may need SNF rehab at dc. Discussed with pt who states that if he needs this, he would like to go to [facility] as his wife is there. Pt asks that LCSW update his step son Don [name]. Will attempt to call [name] today. Will start on SNF and ALF referrals as well as initiate HD outpt schedule as soon as needed clinical is available. Pt indicates he has been seeing [name] and would like to continue to do so. Will refer pt to [facility]. Will follow up [place] to further assist with dc planning.  Employment status:  Retired Nurse, adult PT Recommendations:  Not assessed at this time Information / Referral to community resources:  [place]  Patient/Family's Response to care: Pt accepting of care.  Patient/Family's Understanding of and Emotional Response to Diagnosis, Current Treatment, and Prognosis: Pt appears to have a good understanding of diagnosis and treatment recommendations. No emotional distress identified.  Emotional Assessment Appearance:  Appears stated age Attitude/Demeanor/Rapport:  Engaged Affect (typically observed):  Pleasant, Calm Orientation:  Oriented to Self, Oriented to Place, Oriented to  Time, Oriented to Situation Alcohol / Substance use:  Not Applicable Psych involvement (Current and /or in the community):  No (Comment)  Discharge Needs  Concerns to be addressed:  Discharge Planning Concerns Readmission within the last 30 days:  No Current discharge risk:  Lives alone, Chronically ill Barriers to Discharge:  Continued Medical Work up, Waiting for outpatient dialysis, [place], LCSW 10/25/2018, 12:18 PM

## 2018-10-25 NOTE — Progress Notes (Addendum)
Subjective:  Can tell he does not feel right but nothing specific, getting blood - made 1700 of urine and does not currently have O2 requirements.  BUN and creatinine went up  Objective Vital signs in last 24 hours: Vitals:   10/25/18 1030 10/25/18 1045 10/25/18 1050 10/25/18 1132  BP:   138/79   Pulse: (!) 57 (!) 104 (!) 120 (!) 109  Resp: 18 14 (!) 22 20  Temp: (!) 97.4 F (36.3 C)  97.6 F (36.4 C) 97.6 F (36.4 C)  TempSrc: Oral  Oral Oral  SpO2: 95% 98% 97% 100%  Weight:      Height:       Weight change: -4.454 kg  Intake/Output Summary (Last 24 hours) at 10/25/2018 1142 Last data filed at 10/25/2018 0405 Gross per 24 hour  Intake 540 ml  Output 1250 ml  Net -710 ml    Assessment/Plan: 1.  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  He is clinically stable but now with BUN in the 90's- GFR of 10 , his age and the fact that he is likely having uremic sxms albeit mild he will need a temp HD cath and initiate HD.  He understands "you've got to do what you've got to do "  Since on eliquis recently cannot get a tunneled HD cath until next week, I have contacted Dr. Constance Haw to place a non tunneled HD cath to look and start with first treatment tomorrow.  AVF will not be ready for 2-3 months.  He tells me that he does not think he will be able to live alone any more, he is probably right- he discussed placement and seems open to it 2. Anemia of CKD and possible acute due to UGI bleed.  Pt reports dark black stools intermittently for the past week.  Follow h/h - transfusion ordered for today.   iron stores low, will replete and also on ESA.   3. CHF- ECHO pending.  Possibly due to anemia or progressive CKD but need to evaluate EF. 4. SOB- cxr with nodular opacities possible pulm edema vs multilobar PNA.  Pt does not appear to be very ill and not hypoxic- actually will stop lasix 5. A fib- tachy, on eliquis. 6. SHPTH- on calcitriol ,  Phos WNL no  binder 7. Vascular access- LUE BVT +T/B placed 10/11/18.  Will need HD cath if we have to initiate HD during this admission. 8. Moderate protein malnutrition- now alb in the 2's.... more of an argument to just go ahead and initiate HD  Plan to initiate first HD tomorrow- pt will be followed at a distance over weekend.  Call Dr. Justin Mend for issues over weekend  [phone]   Don [name]: Basic Metabolic Panel: Recent Labs  Lab 10/23/18 1602 10/24/18 0340 10/25/18 0409  NA 140 142 140  K 5.0 4.9 4.7  CL 112* 114* 107  CO2 19* 20* 22  GLUCOSE 91 105* 178*  BUN 75* 77* 90*  CREATININE 4.39* 4.39* 5.12*  CALCIUM 8.4* 8.2* 8.4*  PHOS  --   --  4.6   Liver Function Tests: Recent Labs  Lab 10/22/18 1546 10/24/18 0340 10/25/18 0409  AST 12 16  --   ALT 13 16  --   ALKPHOS 130* 99  --   BILITOT 0.5 0.9  --   PROT 5.3* 5.1*  --   ALBUMIN 3.4* 2.6* 2.7*   No results for  input(s): LIPASE, AMYLASE in the last 168 hours. No results for input(s): AMMONIA in the last 168 hours. CBC: Recent Labs  Lab 10/23/18 1602 10/24/18 0340 10/25/18 0409  WBC 7.8 8.5 6.2  HGB 7.8* 7.2* 7.8*  HCT 25.5* 22.7* 25.2*  MCV 106.7* 103.7* 100.8*  PLT 202 181 208   Cardiac Enzymes: Recent Labs  Lab 10/23/18 1602 10/23/18 2206 10/24/18 0340 10/24/18 0934  TROPONINI 0.06* 0.05* 0.04* 0.03*   CBG: No results for input(s): GLUCAP in the last 168 hours.  Iron Studies:  Recent Labs    10/24/18 1601  IRON 56  TIBC 254  FERRITIN 106   Studies/Results: Dg Chest 2 View  Result Date: 10/23/2018 CLINICAL DATA:  Chest pain and weakness EXAM: CHEST - 2 VIEW COMPARISON:  03/19/2018 FINDINGS: Bilateral diffuse mild interstitial and patchy alveolar airspace opacities. Two areas of nodular airspace disease in the left upper lobe and left lower lobe. Small bilateral pleural effusions. No pneumothorax. Stable cardiomediastinal silhouette. No aggressive osseous lesion. IMPRESSION:  1. Bilateral diffuse mild interstitial and alveolar airspace disease with 2 nodular areas of airspace disease in left upper lobe and left lower lobe. Small bilateral pleural effusions. Differential considerations include mild pulmonary edema versus multilobar pneumonia. Electronically Signed   By: Kathreen Devoid   On: 10/23/2018 15:38   Medications: Infusions: . sodium chloride      Scheduled Medications: . atorvastatin  20 mg Oral QHS  . bisoprolol  10 mg Oral q morning - 10a  . budesonide (PULMICORT) nebulizer solution  0.5 mg Nebulization BID  . calcitRIOL  0.25 mcg Oral q morning - 10a  . Chlorhexidine Gluconate Cloth  6 each Topical Q0600  . clonazePAM  0.5 mg Oral QHS  . dextromethorphan-guaiFENesin  1 tablet Oral BID  . diltiazem  240 mg Oral q morning - 10a  . epoetin (EPOGEN/PROCRIT) injection  10,000 Units Subcutaneous Q14 Days  . feeding supplement (NEPRO CARB STEADY)  237 mL Oral BID BM  . furosemide  40 mg Intravenous Daily  . ipratropium  0.5 mg Nebulization Q6H  . levalbuterol  0.63 mg Nebulization Q6H  . loratadine  10 mg Oral QHS  . methylPREDNISolone (SOLU-MEDROL) injection  40 mg Intravenous Q12H  . mupirocin ointment  1 application Nasal BID  . nicotine  14 mg Transdermal Daily  . pantoprazole  40 mg Oral BID  . terazosin  2 mg Oral QHS    have reviewed scheduled and prn medications.  Physical Exam: General: pale, NAD Heart: tachy  Lungs: CBS bilat  Abdomen: soft, non tender Extremities: no edema Dialysis Access: left upper arm AVF- good thrill and bruit     10/25/2018,11:42 AM  LOS: 2 days

## 2018-10-25 NOTE — Progress Notes (Signed)
Dialysis catheter placed to right femoral area by Dr. Constance Haw. Informed consent obtained and form completed by Dr. Constance Haw. Patient stated he signs own consents, asked nursing staff to notify his [facility]). Notified [name] with Dr. Marolyn Haller present. Stated he understood procedure after speaking with Nephrologist, no questions for Dr. Constance Haw and patient can sign his own consent. Time out completed prior to procedure. Patient tolerated well with no complaints. Don Foil, RN

## 2018-10-25 NOTE — Progress Notes (Signed)
*  PRELIMINARY RESULTS* Echocardiogram 2D Echocardiogram has been performed.  Don [name] 10/25/2018, 3:09 PM

## 2018-10-26 DIAGNOSIS — I120 Hypertensive chronic kidney disease with stage 5 chronic kidney disease or end stage renal disease: Secondary | ICD-10-CM

## 2018-10-26 DIAGNOSIS — E8770 Fluid overload, unspecified: Secondary | ICD-10-CM

## 2018-10-26 LAB — RENAL FUNCTION PANEL
Albumin: 2.9 g/dL — ABNORMAL LOW (ref 3.5–5.0)
Anion gap: 12 (ref 5–15)
BUN: 98 mg/dL — ABNORMAL HIGH (ref 8–23)
CO2: 23 mmol/L (ref 22–32)
Calcium: 8.6 mg/dL — ABNORMAL LOW (ref 8.9–10.3)
Chloride: 103 mmol/L (ref 98–111)
Creatinine, Ser: 5.11 mg/dL — ABNORMAL HIGH (ref 0.61–1.24)
GFR calc Af Amer: 11 mL/min — ABNORMAL LOW (ref >60)
GFR calc non Af Amer: 10 mL/min — ABNORMAL LOW (ref >60)
Glucose, Bld: 159 mg/dL — ABNORMAL HIGH (ref 70–99)
Phosphorus: 5 mg/dL — ABNORMAL HIGH (ref 2.5–4.6)
Potassium: 4.6 mmol/L (ref 3.5–5.1)
Sodium: 138 mmol/L (ref 135–145)

## 2018-10-26 LAB — GLUCOSE, CAPILLARY: GLUCOSE-CAPILLARY: 176 mg/dL — AB (ref 70–99)

## 2018-10-26 LAB — CBC
HCT: 30.2 % — ABNORMAL LOW (ref 39.0–52.0)
Hemoglobin: 9.5 g/dL — ABNORMAL LOW (ref 13.0–17.0)
MCH: 31.7 pg (ref 26.0–34.0)
MCHC: 31.5 g/dL (ref 30.0–36.0)
MCV: 100.7 fL — ABNORMAL HIGH (ref 80.0–100.0)
NRBC: 0 % (ref 0.0–0.2)
Platelets: 227 10*3/uL (ref 150–400)
RBC: 3 MIL/uL — ABNORMAL LOW (ref 4.22–5.81)
RDW: 15.7 % — ABNORMAL HIGH (ref 11.5–15.5)
WBC: 12.7 10*3/uL — ABNORMAL HIGH (ref 4.0–10.5)

## 2018-10-26 MED ORDER — ALTEPLASE 2 MG IJ SOLR: 2.0000 mg | Freq: Once | INTRAMUSCULAR | Status: DC | PRN

## 2018-10-26 MED ORDER — SODIUM CHLORIDE 0.9 % IV SOLN: 100.0000 mL | INTRAVENOUS | Status: DC | PRN

## 2018-10-26 MED ORDER — IPRATROPIUM BROMIDE 0.02 % IN SOLN
0.5000 mg | Freq: Four times a day (QID) | RESPIRATORY_TRACT | Status: DC
  Administered 2018-10-26 – 2018-10-31 (×14): 0.5 mg via RESPIRATORY_TRACT
  Filled 2018-10-26 (×18): qty 2.5

## 2018-10-26 MED ORDER — POLYETHYLENE GLYCOL 3350 17 G PO PACK
17.0000 g | PACK | Freq: Every day | ORAL | Status: DC
  Administered 2018-10-26 – 2018-10-31 (×5): 17 g via ORAL
  Filled 2018-10-26 (×5): qty 1

## 2018-10-26 MED ORDER — LEVALBUTEROL HCL 0.63 MG/3ML IN NEBU
0.6300 mg | INHALATION_SOLUTION | Freq: Four times a day (QID) | RESPIRATORY_TRACT | Status: DC
  Administered 2018-10-26 – 2018-10-31 (×15): 0.63 mg via RESPIRATORY_TRACT
  Filled 2018-10-26 (×18): qty 3

## 2018-10-26 NOTE — Progress Notes (Signed)
Patient on side of bed receiving bath in good spirits. Neb held for now,.

## 2018-10-26 NOTE — Progress Notes (Signed)
PROGRESS NOTE    Don [name]  WLN:[id number] DOB: [birth year] DOA: 10/23/2018 PCP: Dettinger, Fransisca Kaufmann, MD    Brief Narrative:  77 year old male who presented with dyspnea.  He does have significant past medical history for atrial fibrillation, COPD, hypertension, chronic kidney disease stage IV and prostate cancer.  Patient presented with fatigue, dyspnea and palpitations for 24 hours.  No significant nausea, vomiting or diarrhea.  On his initial physical examination his oxygen saturation was 88% on room air, blood pressure 130/93, heart rate 104, respiratory rate 27, neck was supple, moist mucous membranes, lungs clear to auscultation bilaterally, heart S1-2 present rhythmic, abdomen positive bowel sounds, no guarding, no tenderness, no lower extremity edema.  Chest radiograph with hyperinflation, increased vascular congestion with small bilateral pleural effusions.  Patient was admitted to the hospital with a working diagnosis of acute hypoxic respiratory failure due to cardiogenic pulmonary edema.  Assessment & Plan:   Active Problems:   Acute respiratory failure (HCC)   Acute renal failure superimposed on stage 5 chronic kidney disease, not on chronic dialysis ([place])   1. Acute hypoxic respiratory failure due to volume overload due to AKI on CKD stage V, complicated with pulmonary edema. Patient will start today hemodialysis with ultrafiltration, target negative fluid balance. Will continue oxymetry monitoring and supplemental 02 per [place]. Continue calcitriol, darbapoetin,   2. Acute on chronic diastolic heart failure. Failed diuresis, will continue fluid removal per ultrafiltration. Continue telemetry monitoring.   3. Atrial fibrillation. Continue rate control with diltiazem and bisoprolol. Holding anticoagulation for now.   4. Dyslipidemia. Continue statin therapy.   5. GERD. Continue antiacid therapy with proton pump inhibitors.   6. COPD. No clinical signs of exacerbation,  continue budesonide and bronchodilator therapy. Will discontinue systemic steroids for now.   DVT prophylaxis: scd   Code Status: full Family Communication: no family at the bedside  Disposition Plan/ discharge barriers: pending clinical improvement.   Body mass index is 21.03 kg/m. Malnutrition Type:      Malnutrition Characteristics:      Nutrition Interventions:     RN Pressure Injury Documentation:     Consultants:   Nephrology   Procedures:     Antimicrobials:       Subjective: Patient will start HD today, dyspnea this am that improved with bronchodilator therapy, no nausea or vomiting, no chest pain.   Objective: Vitals:   10/25/18 2137 10/26/18 0412 10/26/18 0604 10/26/18 0747  BP:   136/82   Pulse:   (!) 102   Resp:   18   Temp:   97.6 F (36.4 C)   TempSrc:   Oral   SpO2: 93%  95% 95%  Weight:  64.6 kg    Height:        Intake/Output Summary (Last 24 hours) at 10/26/2018 1040 Last data filed at 10/26/2018 0500 Gross per 24 hour  Intake 1060.77 ml  Output -  Net 1060.77 ml   Filed Weights   10/24/18 1007 10/25/18 0500 10/26/18 0412  Weight: 65.4 kg 62.9 kg 64.6 kg    Examination:   General: deconditioned  Neurology: Awake and alert, non focal  E ENT: mild pallor, no icterus, oral mucosa moist Cardiovascular: No JVD. S1-S2 present, rhythmic, no gallops, rubs, or murmurs. No lower extremity edema. Pulmonary: positive breath sounds bilaterally, adequate air movement, no wheezing, rhonchi or rales. Gastrointestinal. Abdomen with no organomegaly, non tender, no rebound or guarding Skin. No rashes Musculoskeletal: no joint deformities     Data  Reviewed: I have personally reviewed following labs and imaging studies  CBC: Recent Labs  Lab 10/23/18 1602 10/24/18 0340 10/25/18 0409 10/26/18 0618  WBC 7.8 8.5 6.2 12.7*  HGB 7.8* 7.2* 7.8* 9.5*  HCT 25.5* 22.7* 25.2* 30.2*  MCV 106.7* 103.7* 100.8* 100.7*  PLT 202 181 208 093    Basic Metabolic Panel: Recent Labs  Lab 10/22/18 1546 10/23/18 1602 10/24/18 0340 10/25/18 0409 10/26/18 0618  NA 142 140 142 140 138  K 4.9 5.0 4.9 4.7 4.6  CL 108* 112* 114* 107 103  CO2 19* 19* 20* 22 23  GLUCOSE 99 91 105* 178* 159*  BUN 61* 75* 77* 90* 98*  CREATININE 4.07* 4.39* 4.39* 5.12* 5.11*  CALCIUM 8.5* 8.4* 8.2* 8.4* 8.6*  PHOS  --   --   --  4.6 5.0*   GFR: Estimated Creatinine Clearance: 10.7 mL/min (A) (by C-G formula based on SCr of 5.11 mg/dL (H)). Liver Function Tests: Recent Labs  Lab 10/22/18 1546 10/24/18 0340 10/25/18 0409 10/26/18 0618  AST 12 16  --   --   ALT 13 16  --   --   ALKPHOS 130* 99  --   --   BILITOT 0.5 0.9  --   --   PROT 5.3* 5.1*  --   --   ALBUMIN 3.4* 2.6* 2.7* 2.9*   No results for input(s): LIPASE, AMYLASE in the last 168 hours. No results for input(s): AMMONIA in the last 168 hours. Coagulation Profile: Recent Labs  Lab 10/23/18 1803  INR 1.86   Cardiac Enzymes: Recent Labs  Lab 10/23/18 1602 10/23/18 2206 10/24/18 0340 10/24/18 0934  TROPONINI 0.06* 0.05* 0.04* 0.03*   BNP (last 3 results) No results for input(s): PROBNP in the last 8760 hours. HbA1C: No results for input(s): HGBA1C in the last 72 hours. CBG: No results for input(s): GLUCAP in the last 168 hours. Lipid Profile: No results for input(s): CHOL, HDL, LDLCALC, TRIG, CHOLHDL, LDLDIRECT in the last 72 hours. Thyroid Function Tests: No results for input(s): TSH, T4TOTAL, FREET4, T3FREE, THYROIDAB in the last 72 hours. Anemia Panel: Recent Labs    10/24/18 1601  FERRITIN 106  TIBC 254  IRON 56      Radiology Studies: I have reviewed all of the imaging during this hospital visit personally     Scheduled Meds: . atorvastatin  20 mg Oral QHS  . bisoprolol  10 mg Oral q morning - 10a  . budesonide (PULMICORT) nebulizer solution  0.5 mg Nebulization BID  . calcitRIOL  0.25 mcg Oral q morning - 10a  . Chlorhexidine Gluconate Cloth   6 each Topical Q0600  . Chlorhexidine Gluconate Cloth  6 each Topical Q0600  . clonazePAM  0.5 mg Oral QHS  . dextromethorphan-guaiFENesin  1 tablet Oral BID  . diltiazem  240 mg Oral q morning - 10a  . epoetin (EPOGEN/PROCRIT) injection  10,000 Units Subcutaneous Q14 Days  . feeding supplement (NEPRO CARB STEADY)  237 mL Oral BID BM  . ipratropium  0.5 mg Nebulization Q6H WA  . levalbuterol  0.63 mg Nebulization Q6H WA  . loratadine  10 mg Oral QHS  . methylPREDNISolone (SOLU-MEDROL) injection  40 mg Intravenous Q12H  . mupirocin ointment  1 application Nasal BID  . nicotine  14 mg Transdermal Daily  . pantoprazole  40 mg Oral BID  . terazosin  2 mg Oral QHS   Continuous Infusions: . sodium chloride    . sodium chloride    .  sodium chloride    . ferric gluconate (FERRLECIT/NULECIT) IV 250 mg (10/26/18 0910)     LOS: 3 days        Kaz Auld Gerome Apley, MD [facility] Pager [phone]

## 2018-10-26 NOTE — Progress Notes (Signed)
[facility] ROUNDING NOTE   Subjective:   Patient being initiated on dialysis this morning just with dialysis nurse  Blood pressure 136/82 pulse 102 temperature 97.6 O2 sats 95% room air  Urine output 200 cc 10/25/2018.  Urine output 1.5 L 10/24/2018 weight decreased from 65.4 kg to 64.6 kg  Sodium 138 potassium 4.6 chloride 103 CO2 23 BUN 98 creatinine 5.11 glucose 159 calcium 8.65 phosphorus 5.0 Albumin 2.9 WBC 12.7 hemoglobin 9.5 platelets 227  Chest x-ray 10/23/2018 bilateral diffuse interstitial alveolar airspace disease nodular areas in airspace disease in left upper lobe and left lower lobe bilateral pleural effusions      Objective:  Vital signs in last 24 hours:  Temp:  [97.4 F (36.3 C)-97.8 F (36.6 C)] 97.6 F (36.4 C) (12/14 0604) Pulse Rate:  [91-120] 102 (12/14 0604) Resp:  [18-22] 18 (12/14 0604) BP: (136-156)/(79-83) 136/82 (12/14 0604) SpO2:  [93 %-100 %] 95 % (12/14 0747) Weight:  [64.6 kg] 64.6 kg (12/14 0412)  Weight change: -0.8 kg Filed Weights   10/24/18 1007 10/25/18 0500 10/26/18 0412  Weight: 65.4 kg 62.9 kg 64.6 kg    Intake/Output: I/O last 3 completed shifts: In: 1600.8 [P.O.:1020; Blood:463.3; IV Piggyback:117.4] Out: 600 [Urine:600]   Intake/Output this shift:  No intake/output data recorded.  CVS- RRR RS- CTA ABD- BS present soft non-distended EXT- no edema   Basic Metabolic Panel: Recent Labs  Lab 10/22/18 1546 10/23/18 1602 10/24/18 0340 10/25/18 0409 10/26/18 0618  NA 142 140 142 140 138  K 4.9 5.0 4.9 4.7 4.6  CL 108* 112* 114* 107 103  CO2 19* 19* 20* 22 23  GLUCOSE 99 91 105* 178* 159*  BUN 61* 75* 77* 90* 98*  CREATININE 4.07* 4.39* 4.39* 5.12* 5.11*  CALCIUM 8.5* 8.4* 8.2* 8.4* 8.6*  PHOS  --   --   --  4.6 5.0*    Liver Function Tests: Recent Labs  Lab 10/22/18 1546 10/24/18 0340 10/25/18 0409 10/26/18 0618  AST 12 16  --   --   ALT 13 16  --   --   ALKPHOS 130* 99  --   --    BILITOT 0.5 0.9  --   --   PROT 5.3* 5.1*  --   --   ALBUMIN 3.4* 2.6* 2.7* 2.9*   No results for input(s): LIPASE, AMYLASE in the last 168 hours. No results for input(s): AMMONIA in the last 168 hours.  CBC: Recent Labs  Lab 10/23/18 1602 10/24/18 0340 10/25/18 0409 10/26/18 0618  WBC 7.8 8.5 6.2 12.7*  HGB 7.8* 7.2* 7.8* 9.5*  HCT 25.5* 22.7* 25.2* 30.2*  MCV 106.7* 103.7* 100.8* 100.7*  PLT 202 181 208 227    Cardiac Enzymes: Recent Labs  Lab 10/23/18 1602 10/23/18 2206 10/24/18 0340 10/24/18 0934  TROPONINI 0.06* 0.05* 0.04* 0.03*    BNP: Invalid input(s): POCBNP  CBG: No results for input(s): GLUCAP in the last 168 hours.  Microbiology: Results for orders placed or performed during the hospital encounter of 10/23/18  MRSA PCR Screening     Status: Abnormal   Collection Time: 10/24/18 10:30 AM  Result Value Ref Range Status   MRSA by PCR POSITIVE (A) NEGATIVE Final    Comment:        The GeneXpert MRSA Assay (FDA approved for NASAL specimens only), is one component of a comprehensive MRSA colonization surveillance program. It is not intended to diagnose MRSA infection nor to guide or monitor treatment for MRSA  infections. RESULT CALLED TO, READ BACK BY AND VERIFIED WITH: J HEARN,RN @0351  10/25/18 [facility] Performed at [facility], [address] [place], [place] [address]     Coagulation Studies: Recent Labs    10/23/18 1803  LABPROT 21.2*  INR 1.86    Urinalysis: No results for input(s): COLORURINE, LABSPEC, PHURINE, GLUCOSEU, HGBUR, BILIRUBINUR, KETONESUR, PROTEINUR, UROBILINOGEN, NITRITE, LEUKOCYTESUR in the last 72 hours.  Invalid input(s): APPERANCEUR    Imaging: No results found.   Medications:   . sodium chloride    . sodium chloride    . sodium chloride    . ferric gluconate (FERRLECIT/NULECIT) IV 250 mg (10/26/18 0910)   . atorvastatin  20 mg Oral QHS  . bisoprolol  10 mg Oral q morning - 10a  . budesonide (PULMICORT)  nebulizer solution  0.5 mg Nebulization BID  . calcitRIOL  0.25 mcg Oral q morning - 10a  . Chlorhexidine Gluconate Cloth  6 each Topical Q0600  . Chlorhexidine Gluconate Cloth  6 each Topical Q0600  . clonazePAM  0.5 mg Oral QHS  . dextromethorphan-guaiFENesin  1 tablet Oral BID  . diltiazem  240 mg Oral q morning - 10a  . epoetin (EPOGEN/PROCRIT) injection  10,000 Units Subcutaneous Q14 Days  . feeding supplement (NEPRO CARB STEADY)  237 mL Oral BID BM  . ipratropium  0.5 mg Nebulization Q6H WA  . levalbuterol  0.63 mg Nebulization Q6H WA  . loratadine  10 mg Oral QHS  . methylPREDNISolone (SOLU-MEDROL) injection  40 mg Intravenous Q12H  . mupirocin ointment  1 application Nasal BID  . nicotine  14 mg Transdermal Daily  . pantoprazole  40 mg Oral BID  . terazosin  2 mg Oral QHS   sodium chloride, sodium chloride, acetaminophen, alteplase, heparin, Melatonin, ondansetron **OR** ondansetron (ZOFRAN) IV, polyvinyl alcohol  Assessment/ Plan:   Acute on chronic kidney disease in setting of CHF.  GFR 10 cc/min uremic symptoms placement of temporary hemodialysis catheter appreciate help from Dr. Constance Haw.  Placed 10/25/2018.  History of left upper extremity DVT with thrill and bruit placed 10/11/2018.  Anemia continues on Procrit.  Iron stores 22% 10/24/2018 status post transfusion packed red blood cells with improvement 10/25/2018  Bones calcitriol 0.25 mcg daily   calcium 8.6 phosphorus 5.0 Albumin 2.9.  Will need to follow corrected calcium.  Hypertension/volume appears to have improved with improved diuresis  Chronic diastolic dysfunction.  Appears to have good blood pressure control and tolerating bisoprolol and diltiazem.  COPD exacerbation IV Solu-Medrol 40 mg every 12 hour, inhalers  Gastroesophageal reflux disease continues PPI  Atrial fibrillation heart rate controlled holding Eliquis due to positive occult stools  BPH no issues with retention continues  terazosin  Extensive history of tobacco abuse  GI bleed history of EGD 03/22/2018 capsule study 03/22/2018 colonoscopy 2015    LOS: [place] @TODAY @10 :49 AM

## 2018-10-26 NOTE — Procedures (Signed)
      INITIAL HEMODIALYSIS TREATMENT NOTE (Saturday, 10-26-18):  First ever hemodialysis treatment performed via right femoral non-tunneled catheter. Exit site unremarkable. Kept even; no fluid removed per Dr. Shelva Majestic order.  Hemodynamically stable throughout HD session.  All blood was returned.  Last dose of Eliquis given 10/23/18.  Tunneled catheter arrangements pending until 12/16.  Rockwell Alexandria, RN

## 2018-10-27 DIAGNOSIS — J441 Chronic obstructive pulmonary disease with (acute) exacerbation: Secondary | ICD-10-CM

## 2018-10-27 DIAGNOSIS — R079 Chest pain, unspecified: Secondary | ICD-10-CM

## 2018-10-27 LAB — BASIC METABOLIC PANEL
Anion gap: 10 (ref 5–15)
BUN: 79 mg/dL — AB (ref 8–23)
CO2: 24 mmol/L (ref 22–32)
Calcium: 8.5 mg/dL — ABNORMAL LOW (ref 8.9–10.3)
Chloride: 100 mmol/L (ref 98–111)
Creatinine, Ser: 3.82 mg/dL — ABNORMAL HIGH (ref 0.61–1.24)
GFR calc Af Amer: 16 mL/min — ABNORMAL LOW (ref >60)
GFR calc non Af Amer: 14 mL/min — ABNORMAL LOW (ref >60)
Glucose, Bld: 123 mg/dL — ABNORMAL HIGH (ref 70–99)
Potassium: 4.5 mmol/L (ref 3.5–5.1)
Sodium: 134 mmol/L — ABNORMAL LOW (ref 135–145)

## 2018-10-27 LAB — GLUCOSE, CAPILLARY: Glucose-Capillary: 119 mg/dL — ABNORMAL HIGH (ref 70–99)

## 2018-10-27 LAB — TYPE AND SCREEN
ABO/RH(D): A POS
Antibody Screen: NEGATIVE
Unit division: 0
Unit division: 0

## 2018-10-27 LAB — BPAM RBC
Blood Product Expiration Date: 201912282359
Blood Product Expiration Date: 202001102359
ISSUE DATE / TIME: 201912131024
Unit Type and Rh: 5100
Unit Type and Rh: 6200

## 2018-10-27 LAB — HEPATITIS B CORE ANTIBODY, TOTAL: Hep B Core Total Ab: NEGATIVE

## 2018-10-27 LAB — HEPATITIS B SURFACE ANTIGEN: Hepatitis B Surface Ag: NEGATIVE

## 2018-10-27 LAB — HEPATITIS B SURFACE ANTIBODY,QUALITATIVE: Hep B S Ab: NONREACTIVE

## 2018-10-27 NOTE — Progress Notes (Signed)
[facility] ROUNDING NOTE   Subjective:   Patient appears to be doing better this morning he has no complaints  Blood pressure 130/70 pulse 83 temperature 97.8 O2 sats 94% room air  Urine output none recorded 10/26/2018  Sodium 134 potassium 4.5 chloride 100 CO2 24 BUN 79 creatinine 3.82 glucose 123 calcium 8.5 WBC 12.7 hemoglobin 9.5 platelets 227   Chest x-ray 10/23/2018 bilateral diffuse interstitial alveolar airspace disease nodular areas in airspace disease in left upper lobe and left lower lobe bilateral pleural effusions      Objective:  Vital signs in last 24 hours:  Temp:  [97.8 F (36.6 C)-98.8 F (37.1 C)] 97.8 F (36.6 C) (12/15 0444) Pulse Rate:  [83-111] 83 (12/15 0444) Resp:  [16-20] 18 (12/15 0444) BP: (117-146)/(59-79) 134/70 (12/15 0444) SpO2:  [94 %-95 %] 94 % (12/15 0837) Weight:  [64.8 kg-66.8 kg] 66.8 kg (12/15 0500)  Weight change: 0.2 kg Filed Weights   10/26/18 0412 10/26/18 1115 10/27/18 0500  Weight: 64.6 kg 64.8 kg 66.8 kg    Intake/Output: I/O last 3 completed shifts: In: 1440 [P.O.:1440] Out: 0    Intake/Output this shift:  Total I/O In: 240 [P.O.:240] Out: -   CVS- RRR RS- CTA ABD- BS present soft non-distended EXT- no edema femoral temporal dialysis catheter right groin dressing clean and dry   has AV fistula that is not mature placed 93/23/5573   Basic Metabolic Panel: Recent Labs  Lab 10/23/18 1602 10/24/18 0340 10/25/18 0409 10/26/18 0618 10/27/18 0614  NA 140 142 140 138 134*  K 5.0 4.9 4.7 4.6 4.5  CL 112* 114* 107 103 100  CO2 19* 20* 22 23 24   GLUCOSE 91 105* 178* 159* 123*  BUN 75* 77* 90* 98* 79*  CREATININE 4.39* 4.39* 5.12* 5.11* 3.82*  CALCIUM 8.4* 8.2* 8.4* 8.6* 8.5*  PHOS  --   --  4.6 5.0*  --     Liver Function Tests: Recent Labs  Lab 10/22/18 1546 10/24/18 0340 10/25/18 0409 10/26/18 0618  AST 12 16  --   --   ALT 13 16  --   --   ALKPHOS 130* 99  --   --   BILITOT 0.5  0.9  --   --   PROT 5.3* 5.1*  --   --   ALBUMIN 3.4* 2.6* 2.7* 2.9*   No results for input(s): LIPASE, AMYLASE in the last 168 hours. No results for input(s): AMMONIA in the last 168 hours.  CBC: Recent Labs  Lab 10/23/18 1602 10/24/18 0340 10/25/18 0409 10/26/18 0618  WBC 7.8 8.5 6.2 12.7*  HGB 7.8* 7.2* 7.8* 9.5*  HCT 25.5* 22.7* 25.2* 30.2*  MCV 106.7* 103.7* 100.8* 100.7*  PLT 202 181 208 227    Cardiac Enzymes: Recent Labs  Lab 10/23/18 1602 10/23/18 2206 10/24/18 0340 10/24/18 0934  TROPONINI 0.06* 0.05* 0.04* 0.03*    BNP: Invalid input(s): POCBNP  CBG: Recent Labs  Lab 10/26/18 1625 10/27/18 0746  GLUCAP 176* 119*    Microbiology: Results for orders placed or performed during the hospital encounter of 10/23/18  MRSA PCR Screening     Status: Abnormal   Collection Time: 10/24/18 10:30 AM  Result Value Ref Range Status   MRSA by PCR POSITIVE (A) NEGATIVE Final    Comment:        The GeneXpert MRSA Assay (FDA approved for NASAL specimens only), is one component of a comprehensive MRSA colonization surveillance program. It is not intended to  diagnose MRSA infection nor to guide or monitor treatment for MRSA infections. RESULT CALLED TO, READ BACK BY AND VERIFIED WITH: J HEARN,RN @0351  10/25/18 [facility] Performed at [facility], [address], [place], [place] [address]     Coagulation Studies: No results for input(s): LABPROT, INR in the last 72 hours.  Urinalysis: No results for input(s): COLORURINE, LABSPEC, PHURINE, GLUCOSEU, HGBUR, BILIRUBINUR, KETONESUR, PROTEINUR, UROBILINOGEN, NITRITE, LEUKOCYTESUR in the last 72 hours.  Invalid input(s): APPERANCEUR    Imaging: No results found.   Medications:   . sodium chloride    . sodium chloride    . sodium chloride    . ferric gluconate (FERRLECIT/NULECIT) IV 250 mg (10/26/18 0910)   . atorvastatin  20 mg Oral QHS  . bisoprolol  10 mg Oral q morning - 10a  . budesonide (PULMICORT)  nebulizer solution  0.5 mg Nebulization BID  . calcitRIOL  0.25 mcg Oral q morning - 10a  . Chlorhexidine Gluconate Cloth  6 each Topical Q0600  . Chlorhexidine Gluconate Cloth  6 each Topical Q0600  . clonazePAM  0.5 mg Oral QHS  . dextromethorphan-guaiFENesin  1 tablet Oral BID  . diltiazem  240 mg Oral q morning - 10a  . epoetin (EPOGEN/PROCRIT) injection  10,000 Units Subcutaneous Q14 Days  . feeding supplement (NEPRO CARB STEADY)  237 mL Oral BID BM  . ipratropium  0.5 mg Nebulization Q6H WA  . levalbuterol  0.63 mg Nebulization Q6H WA  . loratadine  10 mg Oral QHS  . mupirocin ointment  1 application Nasal BID  . nicotine  14 mg Transdermal Daily  . pantoprazole  40 mg Oral BID  . polyethylene glycol  17 g Oral Daily  . terazosin  2 mg Oral QHS   sodium chloride, sodium chloride, acetaminophen, alteplase, heparin, Melatonin, ondansetron **OR** ondansetron (ZOFRAN) IV, polyvinyl alcohol  Assessment/ Plan:   Acute on chronic kidney disease in setting of CHF.  GFR 10 cc/min uremic symptoms placement of temporary hemodialysis catheter appreciate help from Dr. Constance Haw.  Placed 10/25/2018.  History of left upper extremity DVT with thrill and bruit placed 10/11/2018.  Will need arrangements to be made for diatetic catheter to be placed to discharge.  This can be either done at radiology at [facility], IR or [place] [facility]  Anemia continues on Procrit.  Iron stores 22% 10/24/2018 status post transfusion packed red blood cells with improvement 10/25/2018  Bones calcitriol 0.25 mcg daily   calcium 8.6 phosphorus 5.0 Albumin 2.9.  Will need to follow corrected calcium.  Hypertension/volume appears to have improved with improved diuresis  Chronic diastolic dysfunction.  Appears to have good blood pressure control and tolerating bisoprolol and diltiazem.  COPD exacerbation IV Solu-Medrol 40 mg every 12 hour, inhalers  Gastroesophageal reflux disease continues PPI  Atrial  fibrillation heart rate controlled holding Eliquis due to positive occult stools  BPH no issues with retention continues terazosin  Extensive history of tobacco abuse  GI bleed history of EGD 03/22/2018 capsule study 03/22/2018 colonoscopy 2015    LOS: [place] @TODAY @9 :58 AM

## 2018-10-27 NOTE — Progress Notes (Signed)
PROGRESS NOTE    Don [name]  RKY:[id number] DOB: 06/26/1941 DOA: 10/23/2018 PCP: Dettinger, Fransisca Kaufmann, MD     Brief Narrative:  77 year old male with a past medical history significant for atrial fibrillation on chronic anticoagulation using Eliquis; COPD, tobacco abuse, hypertension, prostate cancer status post prostatectomy and chronic kidney disease stage IV who came to the hospital with complaints of fatigue, worsening shortness of breath, lower extremity swelling and orthopnea.  Patient was found with acute on chronic diastolic heart failure, with acute on chronic renal failure.  Please refer to H&P written by Dr. Darrick Meigs for further info/details on admission.  Assessment & Plan: 1-Acute respiratory failure with hypoxia ([place]): In the setting of fluid overload from worsening renal function, COPD exacerbation and acute on chronic diastolic heart failure. -Continue to follow nephrology recommendations. -Patient requiring oxygen supplementation and is otherwise stable at this time. -Continue volume management with hemodialysis at this moment. -Continue low-sodium diet, daily weight and strict intake and output. -Difficult improvement to the use of his steroids and nebulizer treatment.  Will resume now use as needed nebulizer. -there is no fever or frank infiltrates on CXR; continue holding on antibiotics.   2-anemia of chronic kidney disease: With component of acute on chronic GI bleed in a patient that is chronically on anticoagulation. -Patient hemoglobin range in the 8.2-8.6 -Currently 9.5 after 1 unit of PRBC has been given -Continue to follow hemoglobin trend -No further signs of acute bleeding appreciated -Epogen and IV iron as per nephrology recommendations.  3-gastroesophageal reflux disease -will continue PPI  4-atrial fibrillation -Heart rate is fairly well controlled -Continue Cardizem and bisoprolol -continue Holding Eliquis in the setting of positive fecal occult  blood test -Continue monitoring on telemetry another 24 hours; if stable will DC telemetry.  5-BPH -Continue terazosin for now. -Reports good urine output and there is no complaints of urine retention  6-acute on chronic renal failure: stage 4-5  -Nephrology has been consulted and will follow rec's -Patient has been initiated on hemodialysis -We will continue calcitriol -Hopefully temporary tunneled cath to be placed 10/28/2018 -Anticipated next dialysis treatment on 10/29/2018 -Continue Epogen and IV iron as per nephrology recommendations. -Follow daily weights and strict intake and output.  7-hyperlipidemia -Will continue statins.  8-flat elevation of troponin -Most likely in the setting of demand ischemia with acute diastolic heart failure on top of chronic renal failure -Currently not having any chest pain -2D echo with reassuring preservation of EF and demonstrated no wall motion abnormalities.  9-tobacco abuse -Extensive cessation counseling has been provided on 10/24/2018 -Continue nicotine patch.    10-physical deconditioning -Patient will need skilled nursing facility at discharge with anticipated transition to assisted living facility at some point.  DVT prophylaxis: SCD's Code Status: Full code Family Communication: No family at bedside. Disposition Plan: Remains inpatient.  Hopefully will be able to have diet take place tomorrow; next dialysis treatment anticipated on Tuesday, 10/29/2018.  Patient will need skilled nursing facility placement at discharge.  Consultants:   Nephrology service  Procedures:   See below for x-ray report  2D echo: Ejection fraction 55%, wall motion was normal; no regional wall motion abnormalities appreciated.  Unable to assess diastolic function due to atrial fibrillation.  Antimicrobials:  Anti-infectives (From admission, onward)   None     Subjective: No fever, no chest pain, no nausea, no vomiting.  Patient reports overall  improvement in his condition.  Still mildly short of breath with activity and expressed feeling weak and tired.  Tolerated initiation of hemodialysis well on 10/26/2017.  Objective: Vitals:   10/27/18 0500 10/27/18 0837 10/27/18 1304 10/27/18 1344  BP:    112/71  Pulse:    (!) 106  Resp:    18  Temp:    98.2 F (36.8 C)  TempSrc:    Oral  SpO2:  94% 95% 97%  Weight: 66.8 kg     Height:        Intake/Output Summary (Last 24 hours) at 10/27/2018 1514 Last data filed at 10/27/2018 1229 Gross per 24 hour  Intake 1200 ml  Output -  Net 1200 ml   Filed Weights   10/26/18 0412 10/26/18 1115 10/27/18 0500  Weight: 64.6 kg 64.8 kg 66.8 kg    Examination: General exam: Alert, awake, oriented x 3; no requiring oxygen supplementation and overall feeling better.  Still weak and deconditioned.  No distress.  Reports some intermittent episode of urine output.  Patient tolerated well first dialysis treatment on 10/26/2017.  Able to speak in full sentences.  Reports mild shortness of breath with exertion. Respiratory system: No frank crackles, positive rhonchi, no wheezing.  Normal respiratory effort. Cardiovascular system: Rate controlled, positive systolic ejection murmur, no rubs, no gallops, no JVD.   Gastrointestinal system: Abdomen is nondistended, soft and nontender. No organomegaly or masses felt. Normal bowel sounds heard. Central nervous system: Alert and oriented. No focal neurological deficits.  Moving 4 limbs spontaneously. Extremities: No cyanosis or clubbing; no edema appreciated in lower extremities.  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 Skin: No open wounds, no petechiae. Psychiatry: Judgement and insight appear normal. Mood & affect appropriate.    Data Reviewed: I have personally reviewed following labs and imaging studies  CBC: Recent Labs  Lab 10/23/18 1602 10/24/18 0340 10/25/18 0409 10/26/18 0618  WBC 7.8 8.5 6.2 12.7*    HGB 7.8* 7.2* 7.8* 9.5*  HCT 25.5* 22.7* 25.2* 30.2*  MCV 106.7* 103.7* 100.8* 100.7*  PLT 202 181 208 409   Basic Metabolic Panel: Recent Labs  Lab 10/23/18 1602 10/24/18 0340 10/25/18 0409 10/26/18 0618 10/27/18 0614  NA 140 142 140 138 134*  K 5.0 4.9 4.7 4.6 4.5  CL 112* 114* 107 103 100  CO2 19* 20* 22 23 24   GLUCOSE 91 105* 178* 159* 123*  BUN 75* 77* 90* 98* 79*  CREATININE 4.39* 4.39* 5.12* 5.11* 3.82*  CALCIUM 8.4* 8.2* 8.4* 8.6* 8.5*  PHOS  --   --  4.6 5.0*  --    GFR: Estimated Creatinine Clearance: 14.8 mL/min (A) (by C-G formula based on SCr of 3.82 mg/dL (H)).   Liver Function Tests: Recent Labs  Lab 10/22/18 1546 10/24/18 0340 10/25/18 0409 10/26/18 0618  AST 12 16  --   --   ALT 13 16  --   --   ALKPHOS 130* 99  --   --   BILITOT 0.5 0.9  --   --   PROT 5.3* 5.1*  --   --   ALBUMIN 3.4* 2.6* 2.7* 2.9*   Coagulation Profile: Recent Labs  Lab 10/23/18 1803  INR 1.86   Cardiac Enzymes: Recent Labs  Lab 10/23/18 1602 10/23/18 2206 10/24/18 0340 10/24/18 0934  TROPONINI 0.06* 0.05* 0.04* 0.03*   Urine analysis:    Component Value Date/Time   COLORURINE YELLOW 03/15/2018 2110   APPEARANCEUR HAZY (A) 03/15/2018 2110   LABSPEC 1.013 03/15/2018 2110   PHURINE 5.0 03/15/2018 2110  GLUCOSEU NEGATIVE 03/15/2018 2110   HGBUR NEGATIVE 03/15/2018 2110   BILIRUBINUR NEGATIVE 03/15/2018 2110   BILIRUBINUR neg 10/07/2014 [place] 03/15/2018 2110   PROTEINUR 100 (A) 03/15/2018 2110   UROBILINOGEN negative 10/07/2014 1122   UROBILINOGEN 0.2 04/03/2014 2308   NITRITE NEGATIVE 03/15/2018 2110   LEUKOCYTESUR NEGATIVE 03/15/2018 2110    Radiology Studies: No results found.  Scheduled Meds: . atorvastatin  20 mg Oral QHS  . bisoprolol  10 mg Oral q morning - 10a  . budesonide (PULMICORT) nebulizer solution  0.5 mg Nebulization BID  . calcitRIOL  0.25 mcg Oral q morning - 10a  . Chlorhexidine Gluconate Cloth  6 each Topical  Q0600  . Chlorhexidine Gluconate Cloth  6 each Topical Q0600  . clonazePAM  0.5 mg Oral QHS  . dextromethorphan-guaiFENesin  1 tablet Oral BID  . diltiazem  240 mg Oral q morning - 10a  . epoetin (EPOGEN/PROCRIT) injection  10,000 Units Subcutaneous Q14 Days  . feeding supplement (NEPRO CARB STEADY)  237 mL Oral BID BM  . ipratropium  0.5 mg Nebulization Q6H WA  . levalbuterol  0.63 mg Nebulization Q6H WA  . loratadine  10 mg Oral QHS  . mupirocin ointment  1 application Nasal BID  . nicotine  14 mg Transdermal Daily  . pantoprazole  40 mg Oral BID  . polyethylene glycol  17 g Oral Daily  . terazosin  2 mg Oral QHS   Continuous Infusions: . sodium chloride    . sodium chloride    . sodium chloride    . ferric gluconate (FERRLECIT/NULECIT) IV 250 mg (10/27/18 1029)     LOS: 4 days    Time spent: 30 minutes   Barton Dubois, MD [facility] Pager [phone]  If 7PM-7AM, please contact night-coverage [email or web address] Password [facility] 10/27/2018, 3:14 PM

## 2018-10-28 LAB — CBC
HCT: 27.7 % — ABNORMAL LOW (ref 39.0–52.0)
Hemoglobin: 8.7 g/dL — ABNORMAL LOW (ref 13.0–17.0)
MCH: 31.6 pg (ref 26.0–34.0)
MCHC: 31.4 g/dL (ref 30.0–36.0)
MCV: 100.7 fL — ABNORMAL HIGH (ref 80.0–100.0)
Platelets: 207 10*3/uL (ref 150–400)
RBC: 2.75 MIL/uL — ABNORMAL LOW (ref 4.22–5.81)
RDW: 15.3 % (ref 11.5–15.5)
WBC: 6.9 10*3/uL (ref 4.0–10.5)
nRBC: 0.7 % — ABNORMAL HIGH (ref 0.0–0.2)

## 2018-10-28 LAB — PROTIME-INR
INR: 1.18
Prothrombin Time: 14.9 seconds (ref 11.4–15.2)

## 2018-10-28 MED ORDER — EPOETIN ALFA 10000 UNIT/ML IJ SOLN
10000.0000 [IU] | INTRAMUSCULAR | Status: DC
  Administered 2018-10-28 – 2018-10-30 (×2): 10000 [IU] via INTRAVENOUS
  Filled 2018-10-28 (×2): qty 1

## 2018-10-28 MED ORDER — RENA-VITE PO TABS
1.0000 | ORAL_TABLET | Freq: Every day | ORAL | Status: DC
  Administered 2018-10-28 – 2018-10-30 (×3): 1 via ORAL
  Filled 2018-10-28 (×3): qty 1

## 2018-10-28 MED ORDER — CEFAZOLIN SODIUM-DEXTROSE 2-4 GM/100ML-% IV SOLN
2.0000 g | INTRAVENOUS | Status: AC
  Filled 2018-10-28: qty 100

## 2018-10-28 NOTE — Plan of Care (Signed)
  Problem: Acute Rehab PT Goals(only PT should resolve) Goal: Pt Will Go Supine/Side To Sit Outcome: Progressing Flowsheets (Taken 10/28/2018 1205) Pt will go Supine/Side to Sit: Independently Goal: Patient Will Transfer Sit To/From Stand Outcome: Progressing Flowsheets (Taken 10/28/2018 1205) Patient will transfer sit to/from stand: with supervision Goal: Pt Will Transfer Bed To Chair/Chair To Bed Outcome: Progressing Flowsheets (Taken 10/28/2018 1205) Pt will Transfer Bed to Chair/Chair to Bed: with supervision Goal: Pt Will Ambulate Outcome: Progressing Flowsheets (Taken 10/28/2018 1205) Pt will Ambulate: with supervision; 75 feet; with rolling walker   12:06 PM, 10/28/18 Lonell Grandchild, MPT Physical Therapist with [facility] [phone] office [phone] mobile phone

## 2018-10-28 NOTE — Clinical Social Work Note (Signed)
Message left for Lynnae Sandhoff at [facility] in regards to pending referral.   Additional clinicals sent to [facility] at [place].   Shantika Bermea, Clydene Pugh, LCSW

## 2018-10-28 NOTE — Progress Notes (Signed)
PROGRESS NOTE    Don [name]  ASN:[id number] DOB: October 10, 1941 DOA: 10/23/2018 PCP: Dettinger, Fransisca Kaufmann, MD     Brief Narrative:  77 year old male with a past medical history significant for atrial fibrillation on chronic anticoagulation using Eliquis; COPD, tobacco abuse, hypertension, prostate cancer status post prostatectomy and chronic kidney disease stage IV who came to the hospital with complaints of fatigue, worsening shortness of breath, lower extremity swelling and orthopnea.  Patient was found with acute on chronic diastolic heart failure, with acute on chronic renal failure.  Please refer to H&P written by Dr. Darrick Meigs for further info/details on admission.  Assessment & Plan: 1-Acute respiratory failure with hypoxia ([place]): In the setting of fluid overload from worsening renal function, COPD exacerbation and acute on chronic diastolic heart failure. -Continue to follow nephrology recommendations. -Patient requiring oxygen supplementation and is otherwise stable at this time. -Continue volume management with hemodialysis at this moment. -Continue low-sodium diet, daily weight and strict intake and output. -outstanding improvement to the use of his steroids and nebulizer treatment.  Will resume now use as needed nebulizer. -there is no fever or frank infiltrates on CXR; continue holding on antibiotics.   2-anemia of chronic kidney disease: With component of acute on chronic GI bleed in a patient that is chronically on anticoagulation. -Patient hemoglobin range in the 8.2-8.6 -Patient received 1 unit of PRBC during this admission; Hemoglobin has remained stable since then.   -Continue to follow hemoglobin trend -No further signs of acute bleeding appreciated -Epogen and IV iron as per nephrology recommendations.  3-gastroesophageal reflux disease -will continue PPI  4-atrial fibrillation -Heart rate is fairly well controlled -Continue Cardizem and bisoprolol -continue Holding  Eliquis in the setting of positive fecal occult blood test -Continue monitoring on telemetry another 24 hours; if stable will DC telemetry.  5-BPH -Continue terazosin for now. -Reports good urine output and there is no complaints of urine retention  6-acute on chronic renal failure: stage 4-5  -Nephrology has been consulted and will follow rec's -Patient has been initiated on hemodialysis; waiting for outpatient spot. -Continue calcitriol -Hopefully temporary tunneled cath to be placed 10/29/18 -had 3 hours HD today (12/16) -Continue Epogen and IV iron as per nephrology recommendations. -Follow daily weights and strict intake and output.  7-hyperlipidemia -Will continue statins.  8-flat elevation of troponin -Most likely in the setting of demand ischemia with acute diastolic heart failure on top of chronic renal failure -Currently not having any chest pain -2D echo with reassuring preservation of EF and demonstrated no wall motion abnormalities.  9-tobacco abuse -Extensive cessation counseling has been provided on 10/24/2018 -Continue nicotine patch.    10-physical deconditioning -Patient will need skilled nursing facility at discharge with anticipated transition to assisted living facility at some point.  DVT prophylaxis: SCD's Code Status: Full code Family Communication: No family at bedside. Disposition Plan: Remains inpatient.  Hopefully will be able to have dialysis catheter place on 12/17. Had HD today. Patient will need skilled nursing facility placement at discharge.  Consultants:   Nephrology service  Procedures:   See below for x-ray report  2D echo: Ejection fraction 55%, wall motion was normal; no regional wall motion abnormalities appreciated.  Unable to assess diastolic function due to atrial fibrillation.  Antimicrobials:  Anti-infectives (From admission, onward)   Start     Dose/Rate Route Frequency Ordered Stop   10/29/18 0000  ceFAZolin (ANCEF) IVPB  2g/100 mL premix     2 g 200 mL/hr over 30 Minutes  Intravenous To Radiology 10/28/18 1221 10/30/18 0000     Subjective: No fever, no chest pain, no nausea, no vomiting.  Patient overall improved and feeling better.  No requiring oxygen supplementation at this time.  Patient actively having hemodialysis today.  Objective: Vitals:   10/28/18 1315 10/28/18 1345 10/28/18 1400 10/28/18 1429  BP: (!) 118/59 (!) 112/55 123/66 113/76  Pulse: (!) 104 96 (!) 104 (!) 101  Resp:   16 18  Temp:   98.4 F (36.9 C) 98.6 F (37 C)  TempSrc:   Oral   SpO2:   96% 97%  Weight:   66 kg   Height:        Intake/Output Summary (Last 24 hours) at 10/28/2018 1446 Last data filed at 10/28/2018 1345 Gross per 24 hour  Intake 1075 ml  Output 500 ml  Net 575 ml   Filed Weights   10/28/18 0447 10/28/18 1030 10/28/18 1400  Weight: 66.6 kg 66.7 kg 66 kg    Examination: General exam: Alert, awake, oriented x 3; denies chest pain, no nausea, no vomiting, no oxygen supplementation needed.  Good O2 sat on room air.  Patient having dialysis and tolerating treatment well. Respiratory system: Scattered rhonchi appreciated on exam, no crackles, no wheezing.  Normal respiratory effort. Cardiovascular system:Rate controlled. No murmurs, rubs or gallops. Gastrointestinal system: Abdomen is nondistended, soft and nontender. No organomegaly or masses felt. Normal bowel sounds heard. Central nervous system: Alert and oriented. No focal neurological deficits. Extremities: No cyanosis or clubbing.  No edema appreciated on his lower extremities.  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 Skin: No rashes, lesions or ulcers Psychiatry: Judgement and insight appear normal. Mood & affect appropriate.    Data Reviewed: I have personally reviewed following labs and imaging studies  CBC: Recent Labs  Lab 10/23/18 1602 10/24/18 0340 10/25/18 0409 10/26/18 0618 10/28/18 1345  WBC  7.8 8.5 6.2 12.7* 6.9  HGB 7.8* 7.2* 7.8* 9.5* 8.7*  HCT 25.5* 22.7* 25.2* 30.2* 27.7*  MCV 106.7* 103.7* 100.8* 100.7* 100.7*  PLT 202 181 208 227 564   Basic Metabolic Panel: Recent Labs  Lab 10/23/18 1602 10/24/18 0340 10/25/18 0409 10/26/18 0618 10/27/18 0614  NA 140 142 140 138 134*  K 5.0 4.9 4.7 4.6 4.5  CL 112* 114* 107 103 100  CO2 19* 20* 22 23 24   GLUCOSE 91 105* 178* 159* 123*  BUN 75* 77* 90* 98* 79*  CREATININE 4.39* 4.39* 5.12* 5.11* 3.82*  CALCIUM 8.4* 8.2* 8.4* 8.6* 8.5*  PHOS  --   --  4.6 5.0*  --    GFR: Estimated Creatinine Clearance: 14.6 mL/min (A) (by C-G formula based on SCr of 3.82 mg/dL (H)).   Liver Function Tests: Recent Labs  Lab 10/22/18 1546 10/24/18 0340 10/25/18 0409 10/26/18 0618  AST 12 16  --   --   ALT 13 16  --   --   ALKPHOS 130* 99  --   --   BILITOT 0.5 0.9  --   --   PROT 5.3* 5.1*  --   --   ALBUMIN 3.4* 2.6* 2.7* 2.9*   Coagulation Profile: Recent Labs  Lab 10/23/18 1803 10/28/18 1345  INR 1.86 1.18   Cardiac Enzymes: Recent Labs  Lab 10/23/18 1602 10/23/18 2206 10/24/18 0340 10/24/18 0934  TROPONINI 0.06* 0.05* 0.04* 0.03*   Urine analysis:    Component Value Date/Time   COLORURINE YELLOW 03/15/2018 2110  APPEARANCEUR HAZY (A) 03/15/2018 2110   LABSPEC 1.013 03/15/2018 2110   PHURINE 5.0 03/15/2018 2110   GLUCOSEU NEGATIVE 03/15/2018 2110   HGBUR NEGATIVE 03/15/2018 2110   BILIRUBINUR NEGATIVE 03/15/2018 2110   BILIRUBINUR neg 10/07/2014 [place] 03/15/2018 2110   PROTEINUR 100 (A) 03/15/2018 2110   UROBILINOGEN negative 10/07/2014 1122   UROBILINOGEN 0.2 04/03/2014 2308   NITRITE NEGATIVE 03/15/2018 2110   LEUKOCYTESUR NEGATIVE 03/15/2018 2110    Radiology Studies: No results found.  Scheduled Meds: . atorvastatin  20 mg Oral QHS  . bisoprolol  10 mg Oral q morning - 10a  . budesonide (PULMICORT) nebulizer solution  0.5 mg Nebulization BID  . calcitRIOL  0.25 mcg Oral q  morning - 10a  . Chlorhexidine Gluconate Cloth  6 each Topical Q0600  . Chlorhexidine Gluconate Cloth  6 each Topical Q0600  . clonazePAM  0.5 mg Oral QHS  . dextromethorphan-guaiFENesin  1 tablet Oral BID  . diltiazem  240 mg Oral q morning - 10a  . epoetin (EPOGEN/PROCRIT) injection  10,000 Units Intravenous Q M,W,F-HD  . feeding supplement (NEPRO CARB STEADY)  237 mL Oral BID BM  . ipratropium  0.5 mg Nebulization Q6H WA  . levalbuterol  0.63 mg Nebulization Q6H WA  . loratadine  10 mg Oral QHS  . multivitamin  1 tablet Oral QHS  . mupirocin ointment  1 application Nasal BID  . nicotine  14 mg Transdermal Daily  . pantoprazole  40 mg Oral BID  . polyethylene glycol  17 g Oral Daily  . terazosin  2 mg Oral QHS   Continuous Infusions: . sodium chloride    . sodium chloride    . sodium chloride    . [START ON 10/29/2018]  ceFAZolin (ANCEF) IV       LOS: 5 days    Time spent: 30 minutes   Barton Dubois, MD [facility] Pager [phone]  If 7PM-7AM, please contact night-coverage [email or web address] Password [email or web address] 10/28/2018, 2:46 PM

## 2018-10-28 NOTE — Procedures (Signed)
    HEMODIALYSIS TREATMENT NOTE (HD #2 - Monday 10/28/18):  3 hour heparin-free dialysis completed via right femoral temporary catheter. Exit site unremarkable. Goal met: 0.5 L removed without interruption in ultrafiltration.  Hemodynamically stable throughout HD session.  All blood was returned.  For tunneled catheter placement tomorrow morning [facility] IR @ 0800.  Pt verbalized understanding to remain NPO after midnight tonight.  Stat PT/INR and CBC collected, delivered to lab.  Primary nurse to arrange transportation via [place].  Rockwell Alexandria, RN

## 2018-10-28 NOTE — Procedures (Signed)
Patient seen on Hemodialysis. BP 132/75 (BP Location: Right Arm)   Pulse 93   Temp 97.7 F (36.5 C) (Oral)   Resp 18   Ht 5\' 9"  (1.753 m)   Wt 66.6 kg   SpO2 94%   BMI 21.68 kg/m   QB 300, UF goal 0.5L Tolerating treatment without complaints at this time.   Elmarie Shiley MD [facility]. Office # [phone] Pager # [phone] 11:00 AM

## 2018-10-28 NOTE — Progress Notes (Addendum)
Patients heart rate sustaining 120's in Afib. Mid Level notified. New Orders placed.

## 2018-10-28 NOTE — Progress Notes (Signed)
[facility] Progress Note   Assessment/ Plan:   1.  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  Will prescribe for another dialysis treatment today.  He lives in [place] and intends to get his dialysis here at the [facility] unit in [place] under the care of Dr. Lowanda Foster.  We will need to initiate the process for outpatient dialysis unit placement and I will discuss with case management regarding the possibility of transfer to [facility] IR for [facility] placement versus referral upon discharge for this. 2.  Acute hypoxic respiratory failure: Suspected to be multifactorial from exacerbation of diastolic heart failure, COPD exacerbation (without infection) and worsening renal function.  Continues to have cough with some shortness of breath with exertion-will order for hemodialysis today with cautious ultrafiltration. 3.  Anemia: Secondary to anemia of chronic kidney disease and with improvement noted status post PRBC.  Getting intravenous iron.  Recent FOBT positive noted prompting temporary stoppage of Eliquis. 4.  Atrial fibrillation: Rate controlled at this time-on bisoprolol, Eliquis currently on hold 5.  Secondary hyperparathyroidism: Phosphorus and calcium levels acceptable.  On calcitriol for PTH suppression. 6.  Hypertension: Blood pressure under fair control, monitor with ultrafiltration at hemodialysis. 7.  Nutrition: Continue current diet with fluid restriction and renal vitamin.  Subjective:   Reports to be feeling fairly, still having some cough with sputum production and some exertional dyspnea.   Objective:   BP 132/75 (BP Location: Right Arm)   Pulse 93   Temp 97.7 F (36.5 C) (Oral)   Resp 18   Ht 5\' 9"  (1.753 m)   Wt 66.6 kg   SpO2 94%   BMI 21.68 kg/m   Intake/Output Summary (Last 24 hours) at 10/28/2018  0823 Last data filed at 10/27/2018 1700 Gross per 24 hour  Intake 960 ml  Output -  Net 960 ml   Weight change: 1.8 kg  Physical Exam: Gen: Comfortably sitting up on the edge of his bed, eating breakfast CVS: Pulse irregularly irregular, normal rate, ESM over apex Resp: Distant breath sounds bilaterally without distinct rales or rhonchi Abd: Soft, flat, nontender.  Right femoral dialysis catheter Ext: Trace ankle edema bilaterally.  Status post first stage left upper arm BVT-palpable thrill  Imaging: No results found.  Labs: BMET Recent Labs  Lab 10/22/18 1546 10/23/18 1602 10/24/18 0340 10/25/18 0409 10/26/18 0618 10/27/18 0614  NA 142 140 142 140 138 134*  K 4.9 5.0 4.9 4.7 4.6 4.5  CL 108* 112* 114* 107 103 100  CO2 19* 19* 20* 22 23 24   GLUCOSE 99 91 105* 178* 159* 123*  BUN 61* 75* 77* 90* 98* 79*  CREATININE 4.07* 4.39* 4.39* 5.12* 5.11* 3.82*  CALCIUM 8.5* 8.4* 8.2* 8.4* 8.6* 8.5*  PHOS  --   --   --  4.6 5.0*  --    CBC Recent Labs  Lab 10/23/18 1602 10/24/18 0340 10/25/18 0409 10/26/18 0618  WBC 7.8 8.5 6.2 12.7*  HGB 7.8* 7.2* 7.8* 9.5*  HCT 25.5* 22.7* 25.2* 30.2*  MCV 106.7* 103.7* 100.8* 100.7*  PLT 202 181 208 227    Medications:    . atorvastatin  20 mg Oral QHS  . bisoprolol  10 mg Oral q morning - 10a  . budesonide (PULMICORT) nebulizer solution  0.5 mg Nebulization BID  . calcitRIOL  0.25 mcg Oral q morning -  10a  . Chlorhexidine Gluconate Cloth  6 each Topical Q0600  . Chlorhexidine Gluconate Cloth  6 each Topical Q0600  . clonazePAM  0.5 mg Oral QHS  . dextromethorphan-guaiFENesin  1 tablet Oral BID  . diltiazem  240 mg Oral q morning - 10a  . epoetin (EPOGEN/PROCRIT) injection  10,000 Units Subcutaneous Q14 Days  . feeding supplement (NEPRO CARB STEADY)  237 mL Oral BID BM  . ipratropium  0.5 mg Nebulization Q6H WA  . levalbuterol  0.63 mg Nebulization Q6H WA  . loratadine  10 mg Oral QHS  . mupirocin ointment  1 application  Nasal BID  . nicotine  14 mg Transdermal Daily  . pantoprazole  40 mg Oral BID  . polyethylene glycol  17 g Oral Daily  . terazosin  2 mg Oral QHS   Elmarie Shiley, MD 10/28/2018, 8:23 AM

## 2018-10-28 NOTE — Evaluation (Signed)
Physical Therapy Evaluation Patient Details Name: Don [name] MRN: [id number] DOB: 09/23/1941 Today's Date: 10/28/2018   History of Present Illness   Edinson Domeier  is a 77 y.o. male, with history of atrial fibrillation on chronic anticoagulation with Coumadin, COPD, hypertension, prostate cancer status post prostatectomy, CKD stage IV came to hospital with fatigue, shortness of breath, palpitations which started yesterday.  Patient said the symptoms started after he ate takeout food, after that it became fatigue had palpitations.  Patient also has had AV fistula made but has not started dialysis yet.  He also complains of black-colored stool.    Clinical Impression  Patient demonstrates slightly labored movement for transfers and ambulation in hallway without loss of balance, slightly unsteady on feet and required use of RW for safety.  Patient tolerated sitting up in chair after therapy.  Patient will benefit from continued physical therapy in hospital and recommended venue below to increase strength, balance, endurance for safe ADLs and gait.    Follow Up Recommendations SNF;Supervision/Assistance - 24 hour    Equipment Recommendations  None recommended by PT    Recommendations for Other Services       Precautions / Restrictions Precautions Precautions: Fall Restrictions Weight Bearing Restrictions: No      Mobility  Bed Mobility Overal bed mobility: Modified Independent             General bed mobility comments: increased time  Transfers Overall transfer level: Needs assistance Equipment used: Rolling walker (2 wheeled) Transfers: Sit to/from [facility] Sit to Stand: Min guard Stand pivot transfers: Min guard       General transfer comment: slightly labored movement  Ambulation/Gait Ambulation/Gait assistance: Min guard Gait Distance (Feet): 50 Feet Assistive device: Rolling walker (2 wheeled) Gait Pattern/deviations: Decreased step length  - right;Decreased step length - left;Decreased stance time - left Gait velocity: decreased   General Gait Details: slow slightly labored cadence without loss of balance, limited secondary to c/o fatigue  Stairs            Wheelchair Mobility    Modified Rankin (Stroke Patients Only)       Balance Overall balance assessment: Needs assistance Sitting-balance support: Feet supported;No upper extremity supported Sitting balance-Leahy Scale: Good     Standing balance support: Bilateral upper extremity supported;During functional activity Standing balance-Leahy Scale: Fair Standing balance comment: using RW                             Pertinent Vitals/Pain Pain Assessment: 0-10 Pain Location: site of dialysis catheter in right groin area Pain Descriptors / Indicators: Discomfort Pain Intervention(s): Limited activity within patient's tolerance;Monitored during session    Home Living Family/patient expects to be discharged to:: Private residence Living Arrangements: Alone Available Help at Discharge: Family;Available PRN/intermittently Type of Home: Mobile home Home Access: Ramped entrance     Home Layout: One level Home Equipment: Cane - single point;Walker - 2 wheels;Shower seat;Bedside commode;Wheelchair - manual;Hospital bed Additional Comments: all DME's left over from his spouse who had a stroke    Prior Function Level of Independence: Independent         Comments: household ambulator, does not drive due to visual problems     Hand Dominance   Dominant Hand: Left    Extremity/Trunk Assessment   Upper Extremity Assessment Upper Extremity Assessment: Generalized weakness    Lower Extremity Assessment Lower Extremity Assessment: Generalized weakness    Cervical / Trunk  Assessment Cervical / Trunk Assessment: Normal  Communication   Communication: No difficulties  Cognition Arousal/Alertness: Awake/alert Behavior During Therapy: WFL  for tasks assessed/performed Overall Cognitive Status: Within Functional Limits for tasks assessed                                        General Comments      Exercises     Assessment/Plan    PT Assessment Patient needs continued PT services  PT Problem List Decreased strength;Decreased activity tolerance;Decreased balance;Decreased mobility       PT Treatment Interventions Gait training;Stair training;Functional mobility training;Therapeutic activities;Patient/family education;Therapeutic exercise    PT Goals (Current goals can be found in the Care Plan section)  Acute Rehab PT Goals Patient Stated Goal: return home when stronger PT Goal Formulation: With patient Time For Goal Achievement: 11/11/18 Potential to Achieve Goals: Good    Frequency Min 3X/week   Barriers to discharge        Co-evaluation               AM-PAC PT "6 Clicks" Mobility  Outcome Measure Help needed turning from your back to your side while in a flat bed without using bedrails?: None Help needed moving from lying on your back to sitting on the side of a flat bed without using bedrails?: None Help needed moving to and from a bed to a chair (including a wheelchair)?: A Little Help needed standing up from a chair using your arms (e.g., wheelchair or bedside chair)?: A Little Help needed to walk in hospital room?: A Little Help needed climbing 3-5 steps with a railing? : A Lot 6 Click Score: 19    End of Session   Activity Tolerance: Patient tolerated treatment well;Patient limited by fatigue Patient left: in chair;with call bell/phone within reach Nurse Communication: Mobility status PT Visit Diagnosis: Unsteadiness on feet (R26.81);Other abnormalities of gait and mobility (R26.89);Muscle weakness (generalized) (M62.81)    Time: 2263-3354 PT Time Calculation (min) (ACUTE ONLY): 22 min   Charges:   PT Evaluation $PT Eval Moderate Complexity: 1 Mod PT  Treatments $Therapeutic Activity: 23-37 mins        12:03 PM, 10/28/18 Lonell Grandchild, MPT Physical Therapist with [facility] [phone] office [phone] mobile phone

## 2018-10-28 NOTE — Progress Notes (Signed)
Patient ID: Don [name], male   DOB: July 05, 1941, 77 y.o.   MRN: [id number]   Scheduled for IR procedure 12/17  Pt to be at [facility] 800 am via ambulance See orders  Will return to [facility] after procedure

## 2018-10-29 ENCOUNTER — Encounter (HOSPITAL_COMMUNITY): Payer: Self-pay

## 2018-10-29 ENCOUNTER — Ambulatory Visit (HOSPITAL_COMMUNITY)
Admission: RE | Admit: 2018-10-29 | Discharge: 2018-10-29 | Disposition: A | Discharge status: Home / Self Care | Payer: Medicare HMO | Source: Ambulatory Visit | Attending: Nephrology | Admitting: Nephrology

## 2018-10-29 DIAGNOSIS — F17210 Nicotine dependence, cigarettes, uncomplicated: Secondary | ICD-10-CM

## 2018-10-29 DIAGNOSIS — I120 Hypertensive chronic kidney disease with stage 5 chronic kidney disease or end stage renal disease: Secondary | ICD-10-CM | POA: Insufficient documentation

## 2018-10-29 DIAGNOSIS — E785 Hyperlipidemia, unspecified: Secondary | ICD-10-CM

## 2018-10-29 DIAGNOSIS — Z8249 Family history of ischemic heart disease and other diseases of the circulatory system: Secondary | ICD-10-CM | POA: Insufficient documentation

## 2018-10-29 DIAGNOSIS — J449 Chronic obstructive pulmonary disease, unspecified: Secondary | ICD-10-CM | POA: Insufficient documentation

## 2018-10-29 DIAGNOSIS — Z992 Dependence on renal dialysis: Secondary | ICD-10-CM | POA: Insufficient documentation

## 2018-10-29 DIAGNOSIS — N186 End stage renal disease: Secondary | ICD-10-CM | POA: Insufficient documentation

## 2018-10-29 DIAGNOSIS — R7303 Prediabetes: Secondary | ICD-10-CM | POA: Insufficient documentation

## 2018-10-29 DIAGNOSIS — Z801 Family history of malignant neoplasm of trachea, bronchus and lung: Secondary | ICD-10-CM | POA: Insufficient documentation

## 2018-10-29 DIAGNOSIS — Z833 Family history of diabetes mellitus: Secondary | ICD-10-CM

## 2018-10-29 DIAGNOSIS — Z87890 Personal history of sex reassignment: Secondary | ICD-10-CM | POA: Insufficient documentation

## 2018-10-29 DIAGNOSIS — Z8546 Personal history of malignant neoplasm of prostate: Secondary | ICD-10-CM

## 2018-10-29 DIAGNOSIS — Z7901 Long term (current) use of anticoagulants: Secondary | ICD-10-CM | POA: Insufficient documentation

## 2018-10-29 DIAGNOSIS — I4891 Unspecified atrial fibrillation: Secondary | ICD-10-CM

## 2018-10-29 DIAGNOSIS — Z79899 Other long term (current) drug therapy: Secondary | ICD-10-CM | POA: Insufficient documentation

## 2018-10-29 DIAGNOSIS — Z4901 Encounter for fitting and adjustment of extracorporeal dialysis catheter: Secondary | ICD-10-CM | POA: Diagnosis not present

## 2018-10-29 HISTORY — PX: IR US GUIDE VASC ACCESS RIGHT: IMG2390

## 2018-10-29 HISTORY — PX: IR FLUORO GUIDE CV LINE RIGHT: IMG2283

## 2018-10-29 LAB — BASIC METABOLIC PANEL
Anion gap: 8 (ref 5–15)
BUN: 66 mg/dL — ABNORMAL HIGH (ref 8–23)
CO2: 26 mmol/L (ref 22–32)
CREATININE: 3.47 mg/dL — AB (ref 0.61–1.24)
Calcium: 8.1 mg/dL — ABNORMAL LOW (ref 8.9–10.3)
Chloride: 104 mmol/L (ref 98–111)
GFR calc Af Amer: 18 mL/min — ABNORMAL LOW (ref >60)
GFR calc non Af Amer: 16 mL/min — ABNORMAL LOW (ref >60)
Glucose, Bld: 103 mg/dL — ABNORMAL HIGH (ref 70–99)
Potassium: 4.8 mmol/L (ref 3.5–5.1)
Sodium: 138 mmol/L (ref 135–145)

## 2018-10-29 LAB — GLUCOSE, CAPILLARY: Glucose-Capillary: 106 mg/dL — ABNORMAL HIGH (ref 70–99)

## 2018-10-29 MED ORDER — FENTANYL CITRATE (PF) 100 MCG/2ML IJ SOLN
INTRAMUSCULAR | Status: AC
  Filled 2018-10-29: qty 6

## 2018-10-29 MED ORDER — LIDOCAINE HCL 1 % IJ SOLN
INTRAMUSCULAR | Status: AC
  Filled 2018-10-29: qty 20

## 2018-10-29 MED ORDER — DILTIAZEM HCL 60 MG PO TABS
240.0000 mg | ORAL_TABLET | Freq: Once | ORAL | Status: AC
  Administered 2018-10-29: 240 mg via ORAL
  Filled 2018-10-29: qty 4

## 2018-10-29 MED ORDER — MIDAZOLAM HCL 2 MG/2ML IJ SOLN
INTRAMUSCULAR | Status: AC
  Filled 2018-10-29: qty 6

## 2018-10-29 MED ORDER — FENTANYL CITRATE (PF) 100 MCG/2ML IJ SOLN
INTRAMUSCULAR | Status: AC | PRN
  Administered 2018-10-29 (×2): 50 ug via INTRAVENOUS

## 2018-10-29 MED ORDER — MIDAZOLAM HCL 2 MG/2ML IJ SOLN
INTRAMUSCULAR | Status: AC | PRN
  Administered 2018-10-29 (×2): 1 mg via INTRAVENOUS

## 2018-10-29 MED ORDER — LIDOCAINE HCL 1 % IJ SOLN
INTRAMUSCULAR | Status: AC | PRN
  Administered 2018-10-29: 10 mL

## 2018-10-29 MED ORDER — HEPARIN SODIUM (PORCINE) 1000 UNIT/ML IJ SOLN
INTRAMUSCULAR | Status: AC
  Filled 2018-10-29: qty 1

## 2018-10-29 NOTE — Progress Notes (Signed)
PROGRESS NOTE    REXTON GREULICH  IRJ:[id number] DOB: May 28, 1941 DOA: 10/23/2018 PCP: Dettinger, Fransisca Kaufmann, MD     Brief Narrative:  77 year old male with a past medical history significant for atrial fibrillation on chronic anticoagulation using Eliquis; COPD, tobacco abuse, hypertension, prostate cancer status post prostatectomy and chronic kidney disease stage IV who came to the hospital with complaints of fatigue, worsening shortness of breath, lower extremity swelling and orthopnea.  Patient was found with acute on chronic diastolic heart failure, with acute on chronic renal failure.  Please refer to H&P written by Dr. Darrick Meigs for further info/details on admission.  Assessment & Plan: 1-Acute respiratory failure with hypoxia ([place]): In the setting of fluid overload from worsening renal function, COPD exacerbation and acute on chronic diastolic heart failure. -Continue to follow nephrology recommendations. -Patient requiring oxygen supplementation and is otherwise stable at this time. -Continue volume management with hemodialysis at this moment. -Continue low-sodium diet, daily weight and strict intake and output. -outstanding improvement to the use of his steroids and nebulizer treatment.  Will resume now use as needed nebulizer. -there is no fever or frank infiltrates on CXR; continue holding on antibiotics.   2-anemia of chronic kidney disease: With component of acute on chronic GI bleed in a patient that is chronically on anticoagulation. -Patient hemoglobin range in the 8.2-8.6 -Patient received 1 unit of PRBC during this admission; Hemoglobin has remained stable since then.   -Continue to follow hemoglobin trend; last Hgb on 12/16 8.7 -No further signs of acute bleeding appreciated -Epogen and IV iron as per nephrology recommendations.  3-gastroesophageal reflux disease -will continue PPI  4-atrial fibrillation -Heart rate is fairly well controlled -Continue Cardizem and  bisoprolol -continue Holding Eliquis in the setting of positive fecal occult blood test and pending HD catheter placement. -rate has remained stable, so will d/c telemetry -patient will need coumadin for secondary prevention, as he is on HD now.  5-BPH -Continue terazosin for now. -Reports good urine output and there is no complaints of urine retention  6-acute on chronic renal failure: stage 4-5  -Nephrology has been consulted and will follow rec's -Patient has been initiated on hemodialysis; waiting for outpatient spot. -Continue calcitriol -Hopefully temporary tunneled cath to be placed 10/29/18 -had 3 hours HD today (12/16) -Continue Epogen and IV iron as per nephrology recommendations. -Follow daily weights and strict intake and output.  7-hyperlipidemia -Will continue statins.  8-flat elevation of troponin -Most likely in the setting of demand ischemia with acute diastolic heart failure on top of chronic renal failure -Currently not having any chest pain -2D echo with reassuring preservation of EF and demonstrated no wall motion abnormalities.  9-tobacco abuse -Extensive cessation counseling has been provided on 10/24/2018 -Continue nicotine patch.    10-physical deconditioning -Patient will need skilled nursing facility at discharge with anticipated transition to assisted living facility at some point.  DVT prophylaxis: SCD's Code Status: Full code Family Communication: No family at bedside. Disposition Plan: Remains inpatient.  Hopefully will be able to have dialysis catheter place later today 12/17. Had HD on 12 /16. Patient will need skilled nursing facility placement at discharge; also waiting confirmation of outpatient HD spot.   Consultants:   Nephrology service  IR  Procedures:   See below for x-ray report  2D echo: Ejection fraction 55%, wall motion was normal; no regional wall motion abnormalities appreciated.  Unable to assess diastolic function due to  atrial fibrillation.  Placement of tunnel dialysis catheter on 10/29/2018  Antimicrobials:  Anti-infectives (From admission, onward)   Start     Dose/Rate Route Frequency Ordered Stop   10/29/18 0000  ceFAZolin (ANCEF) IVPB 2g/100 mL premix     2 g 200 mL/hr over 30 Minutes Intravenous To Radiology 10/28/18 1221 10/30/18 0000     Subjective: No fever, no chest pain, no nausea, no vomiting, no shortness of breath.  Patient expressed feeling weak and tired, but in no acute distress or having any other complaints.  Objective: Vitals:   10/29/18 0504 10/29/18 0509 10/29/18 0737 10/29/18 1337  BP: (!) 88/52   115/65  Pulse: (!) 51 68  80  Resp: 18   19  Temp: 98.4 F (36.9 C)   97.9 F (36.6 C)  TempSrc: Oral   Oral  SpO2: 95%  94% 97%  Weight:      Height:        Intake/Output Summary (Last 24 hours) at 10/29/2018 1517 Last data filed at 10/29/2018 0900 Gross per 24 hour  Intake 720 ml  Output -  Net 720 ml   Filed Weights   10/28/18 1030 10/28/18 1400 10/29/18 0500  Weight: 66.7 kg 66 kg 64.9 kg    Examination: General exam: Alert, awake, oriented x 3; no chest pain, no nausea, no vomiting, no shortness of breath.  Patient in no acute distress.  Reported tolerating dialysis well yesterday.  Good O2 sat on room air. Respiratory system: Positive for scattered rhonchi, no wheezing, no crackles. Respiratory effort normal. Cardiovascular system:Rate controlled. No murmurs, rubs or gallops. Gastrointestinal system: Abdomen is nondistended, soft and nontender. No organomegaly or masses felt. Normal bowel sounds heard. Central nervous system: Alert and oriented. No focal neurological deficits. Extremities: No cyanosis or clubbing.  No edema appreciated on his lower extremities.  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 Skin: No rashes, lesions or ulcers Psychiatry: Judgement and insight appear normal. Mood & affect appropriate.    Data  Reviewed: I have personally reviewed following labs and imaging studies  CBC: Recent Labs  Lab 10/23/18 1602 10/24/18 0340 10/25/18 0409 10/26/18 0618 10/28/18 1345  WBC 7.8 8.5 6.2 12.7* 6.9  HGB 7.8* 7.2* 7.8* 9.5* 8.7*  HCT 25.5* 22.7* 25.2* 30.2* 27.7*  MCV 106.7* 103.7* 100.8* 100.7* 100.7*  PLT 202 181 208 227 017   Basic Metabolic Panel: Recent Labs  Lab 10/24/18 0340 10/25/18 0409 10/26/18 0618 10/27/18 0614 10/29/18 0436  NA 142 140 138 134* 138  K 4.9 4.7 4.6 4.5 4.8  CL 114* 107 103 100 104  CO2 20* 22 23 24 26   GLUCOSE 105* 178* 159* 123* 103*  BUN 77* 90* 98* 79* 66*  CREATININE 4.39* 5.12* 5.11* 3.82* 3.47*  CALCIUM 8.2* 8.4* 8.6* 8.5* 8.1*  PHOS  --  4.6 5.0*  --   --    GFR: Estimated Creatinine Clearance: 15.8 mL/min (A) (by C-G formula based on SCr of 3.47 mg/dL (H)).   Liver Function Tests: Recent Labs  Lab 10/22/18 1546 10/24/18 0340 10/25/18 0409 10/26/18 0618  AST 12 16  --   --   ALT 13 16  --   --   ALKPHOS 130* 99  --   --   BILITOT 0.5 0.9  --   --   PROT 5.3* 5.1*  --   --   ALBUMIN 3.4* 2.6* 2.7* 2.9*   Coagulation Profile: Recent Labs  Lab 10/23/18 1803 10/28/18 1345  INR 1.86 1.18  Cardiac Enzymes: Recent Labs  Lab 10/23/18 1602 10/23/18 2206 10/24/18 0340 10/24/18 0934  TROPONINI 0.06* 0.05* 0.04* 0.03*   Urine analysis:    Component Value Date/Time   COLORURINE YELLOW 03/15/2018 2110   APPEARANCEUR HAZY (A) 03/15/2018 2110   LABSPEC 1.013 03/15/2018 2110   PHURINE 5.0 03/15/2018 2110   GLUCOSEU NEGATIVE 03/15/2018 2110   HGBUR NEGATIVE 03/15/2018 2110   BILIRUBINUR NEGATIVE 03/15/2018 2110   BILIRUBINUR neg 10/07/2014 1122   KETONESUR NEGATIVE 03/15/2018 2110   PROTEINUR 100 (A) 03/15/2018 2110   UROBILINOGEN negative 10/07/2014 1122   UROBILINOGEN 0.2 04/03/2014 2308   NITRITE NEGATIVE 03/15/2018 2110   LEUKOCYTESUR NEGATIVE 03/15/2018 2110    Radiology Studies: Ir Fluoro Guide Cv Line  Right  Result Date: 10/29/2018 INDICATION: 77 year old with end-stage renal disease and maturing AV arm access. Patient needs a tunneled catheter for hemodialysis. EXAM: FLUOROSCOPIC AND ULTRASOUND GUIDED PLACEMENT OF A TUNNELED DIALYSIS CATHETER Physician: Stephan Minister. Anselm Pancoast, MD MEDICATIONS: Ancef 2 g; The antibiotic was administered within an appropriate time interval prior to skin puncture. ANESTHESIA/SEDATION: Versed 2 mg IV; Fentanyl 100 mcg IV; Moderate Sedation Time:  22 minutes The patient was continuously monitored during the procedure by the interventional radiology nurse under my direct supervision. FLUOROSCOPY TIME:  Fluoroscopy Time: 30 seconds, 1 mGy COMPLICATIONS: None immediate. PROCEDURE: The procedure was explained to the patient. The risks and benefits of the procedure were discussed and the patient's questions were addressed. Informed consent was obtained from the patient. The patient was placed supine on the interventional table. Ultrasound confirmed a patent right internal jugular vein. Ultrasound images were obtained for documentation. The right side of the neck and chest was prepped and draped in a sterile fashion. The right neck was anesthetized with 1% lidocaine. Maximal barrier sterile technique was utilized including caps, mask, sterile gowns, sterile gloves, sterile drape, hand hygiene and skin antiseptic. A small incision was made with #11 blade scalpel. A 21 gauge needle directed into the right internal jugular vein with ultrasound guidance. A micropuncture dilator set was placed. A 23 cm tip to cuff Palindrome catheter was selected. The skin below the right clavicle was anesthetized and a small incision was made with an #11 blade scalpel. A subcutaneous tunnel was formed to the vein dermatotomy site. The catheter was brought through the tunnel. The vein dermatotomy site was dilated to accommodate a peel-away sheath. The catheter was placed through the peel-away sheath and directed into  the central venous structures. The tip of the catheter was placed at the SVC/right atrium junction with fluoroscopy. Fluoroscopic images were obtained for documentation. Both lumens were found to aspirate and flush well. The proper amount of heparin was flushed in both lumens. The vein dermatotomy site was closed using a single layer of absorbable suture and Dermabond. The catheter was secured to the skin using Prolene suture. IMPRESSION: Successful placement of a right jugular tunneled dialysis catheter using ultrasound and fluoroscopic guidance. Electronically Signed   By: Markus Daft M.D.   On: 10/29/2018 13:42   Ir US Guide Vasc Access Right  Result Date: 10/29/2018 INDICATION: 77 year old with end-stage renal disease and maturing AV arm access. Patient needs a tunneled catheter for hemodialysis. EXAM: FLUOROSCOPIC AND ULTRASOUND GUIDED PLACEMENT OF A TUNNELED DIALYSIS CATHETER Physician: Stephan Minister. Anselm Pancoast, MD MEDICATIONS: Ancef 2 g; The antibiotic was administered within an appropriate time interval prior to skin puncture. ANESTHESIA/SEDATION: Versed 2 mg IV; Fentanyl 100 mcg IV; Moderate Sedation Time:  22 minutes The patient was  continuously monitored during the procedure by the interventional radiology nurse under my direct supervision. FLUOROSCOPY TIME:  Fluoroscopy Time: 30 seconds, 1 mGy COMPLICATIONS: None immediate. PROCEDURE: The procedure was explained to the patient. The risks and benefits of the procedure were discussed and the patient's questions were addressed. Informed consent was obtained from the patient. The patient was placed supine on the interventional table. Ultrasound confirmed a patent right internal jugular vein. Ultrasound images were obtained for documentation. The right side of the neck and chest was prepped and draped in a sterile fashion. The right neck was anesthetized with 1% lidocaine. Maximal barrier sterile technique was utilized including caps, mask, sterile gowns, sterile  gloves, sterile drape, hand hygiene and skin antiseptic. A small incision was made with #11 blade scalpel. A 21 gauge needle directed into the right internal jugular vein with ultrasound guidance. A micropuncture dilator set was placed. A 23 cm tip to cuff Palindrome catheter was selected. The skin below the right clavicle was anesthetized and a small incision was made with an #11 blade scalpel. A subcutaneous tunnel was formed to the vein dermatotomy site. The catheter was brought through the tunnel. The vein dermatotomy site was dilated to accommodate a peel-away sheath. The catheter was placed through the peel-away sheath and directed into the central venous structures. The tip of the catheter was placed at the SVC/right atrium junction with fluoroscopy. Fluoroscopic images were obtained for documentation. Both lumens were found to aspirate and flush well. The proper amount of heparin was flushed in both lumens. The vein dermatotomy site was closed using a single layer of absorbable suture and Dermabond. The catheter was secured to the skin using Prolene suture. IMPRESSION: Successful placement of a right jugular tunneled dialysis catheter using ultrasound and fluoroscopic guidance. Electronically Signed   By: Markus Daft M.D.   On: 10/29/2018 13:42    Scheduled Meds: . atorvastatin  20 mg Oral QHS  . bisoprolol  10 mg Oral q morning - 10a  . budesonide (PULMICORT) nebulizer solution  0.5 mg Nebulization BID  . calcitRIOL  0.25 mcg Oral q morning - 10a  . Chlorhexidine Gluconate Cloth  6 each Topical Q0600  . clonazePAM  0.5 mg Oral QHS  . dextromethorphan-guaiFENesin  1 tablet Oral BID  . diltiazem  240 mg Oral q morning - 10a  . epoetin (EPOGEN/PROCRIT) injection  10,000 Units Intravenous Q M,W,F-HD  . feeding supplement (NEPRO CARB STEADY)  237 mL Oral BID BM  . ipratropium  0.5 mg Nebulization Q6H WA  . levalbuterol  0.63 mg Nebulization Q6H WA  . loratadine  10 mg Oral QHS  . multivitamin  1  tablet Oral QHS  . mupirocin ointment  1 application Nasal BID  . nicotine  14 mg Transdermal Daily  . pantoprazole  40 mg Oral BID  . polyethylene glycol  17 g Oral Daily  . terazosin  2 mg Oral QHS   Continuous Infusions: . sodium chloride    . sodium chloride    . sodium chloride    .  ceFAZolin (ANCEF) IV       LOS: 6 days    Time spent: 25 minutes   Barton Dubois, MD [facility] Pager [phone]  If 7PM-7AM, please contact night-coverage [email or web address] Password [email or web address] 10/29/2018, 3:17 PM

## 2018-10-29 NOTE — Progress Notes (Signed)
Patient was scheduled to be at IR at 8, [facility] called to inform us that there was an emergency and they had to reroute their truck and were unable to pick him up. IR was called and left a voicemail. Jannifer Franklin was contacted and instructed to call nursing station. Nursing station rescheduled patient to be there at 39. [facility] was called and transportation was set up to be here at 10.

## 2018-10-29 NOTE — Progress Notes (Signed)
BP 88/52. Heart rate 68. Dr. Darrick Meigs notified. No new orders at this time.

## 2018-10-29 NOTE — Procedures (Signed)
Interventional Radiology Procedure:   Indications: ESRD and needs tunneled dialysis catheter until arm access matures  Procedure: Placement of tunneled HD catheter  Findings: Right jugular catheter with tip at SVC/RA junction.  23 cm tip to cuff Palindrome in place.  Complications: None     EBL: Minimal  Plan: Patient can return to [facility].  Right chest catheter is ready to use.      Don Musich R. Anselm Pancoast, MD  Pager: [phone]

## 2018-10-29 NOTE — Clinical Social Work Note (Signed)
Additional clinicals were requested by Reginold Agent at [facility]. Clinicals faxed in.   Lynnae Sandhoff at [facility] started [employer] authorization.   Shanie Mauzy, Clydene Pugh, LCSW

## 2018-10-29 NOTE — H&P (Signed)
Chief Complaint: ESRD now needing hemodialysis  Referring Physician(s): Patel,Jay  Supervising Physician: Markus Daft  Patient Status: [facility] inpatient, transferred to [facility] for procedure.  History of Present Illness: Don [name] is a 77 y.o. male with progressive kidney disease who is now requiring hemodialysis.  He presented to [facility] with shortness of breath. He has known COPD, still smokes, and inspiratory wheezing.  His breathing did improve with initiation of hemodialysis via a temporary HD catheter in the right CFV.  He is here today for placement of a tunneled HD catheter.  He has what looks like an immature left brachiobasilic fistula. He tells me this was just created 2 weeks ago.   He is NPO.   Past Medical History:  Diagnosis Date  . Acute bronchitis 04/03/2014  . Anemia    low iron  . Anxiety   . Atrial fibrillation ([place])   . Cataract   . COPD (chronic obstructive pulmonary disease) ([place])   . Depression   . Essential hypertension   . Hyperlipidemia   . Macular degeneration   . Noncompliance with medications 04/2013   Xarelto, digoxin previously  . Pre-diabetes   . Prostate cancer ([place])   . 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 Tubular adenoma of colon 07/31/02, 11/18/03    Past Surgical History:  Procedure Laterality Date  . Anal abcess,Hemorroids,    . BASCILIC VEIN TRANSPOSITION Left 10/08/2018   Procedure: FIRST STAGE BASILIC VEIN TRANSPOSITION LEFT ARM;  Surgeon: Angelia Mould, MD;  Location: [facility] OR;  Service: Vascular;  Laterality: Left;  . COLONOSCOPY N/A 04/05/2014   Dr. Fuller Plan: 6 mm sessile polyp from sigmoid colon (tubular adenoma), internal hemorrhoids  . COLONOSCOPY W/ BIOPSIES  11/18/2003   Dr. Earle Gell  . ESOPHAGOGASTRODUODENOSCOPY  11/18/2003   Dr. Earle Gell  . ESOPHAGOGASTRODUODENOSCOPY N/A 04/05/2014   Dr. Fuller Plan: variable Z line, negative Barrett's, small hiatal hernia  .  ESOPHAGOGASTRODUODENOSCOPY N/A 02/02/2017   Dr. Oneida Alar: LA Grade B esophagitis, one moderate benign-appearing intrinsic stenosis traversed, small non-bleeding diverticulum in second portion of duodenum, reactive gastropathy, no H.pylori.  . ESOPHAGOGASTRODUODENOSCOPY N/A 03/22/2018   Procedure: ESOPHAGOGASTRODUODENOSCOPY (EGD);  Surgeon: Daneil Dolin, MD;  [facility];  Service: Endoscopy;  Laterality: N/A;  . GIVENS CAPSULE STUDY N/A 03/22/2018   Procedure: GIVENS CAPSULE STUDY;  Surgeon: Daneil Dolin, MD;  Location: [facility];  Service: Endoscopy;  Laterality: N/A;  . RETROPUBIC PROSTATECTOMY  11/26/2001  . TONSILLECTOMY      Allergies: Wellbutrin [bupropion]  Medications: Prior to Admission medications   Medication Sig Start Date End Date Taking? Authorizing Provider  acetaminophen (TYLENOL) 500 MG tablet Take 500 mg by mouth every 6 (six) hours as needed for moderate pain.     [provider]  acitretin (SORIATANE) 10 MG capsule Take 10 mg by mouth daily.     [provider]  albuterol (PROAIR HFA) 108 (90 Base) MCG/ACT inhaler Inhale 2 puffs into the lungs 2 (two) times daily.     [provider]  apixaban (ELIQUIS) 5 MG TABS tablet Take 1 tablet (5 mg total) by mouth 2 (two) times daily. (Needs to be seen) Patient taking differently: Take 5 mg by mouth 2 (two) times daily.  08/16/18   Dettinger, Fransisca Kaufmann, MD  atorvastatin (LIPITOR) 20 MG tablet Take 1 tablet (20 mg total) by mouth daily. (Needs to be seen) Patient taking differently: Take 20 mg by  mouth at bedtime.  08/16/18   Dettinger, Fransisca Kaufmann, MD  bisoprolol (ZEBETA) 10 MG tablet TAKE (1) TABLET TWICE A DAY. Patient taking differently: Take 10 mg by mouth every morning.  10/18/18   Dettinger, Fransisca Kaufmann, MD  budesonide-formoterol (SYMBICORT) 160-4.5 MCG/ACT inhaler Inhale 2 puffs into the lungs 2 (two) times daily.    [provider]  calcitRIOL (ROCALTROL) 0.25 MCG capsule TAKE  (1) CAPSULE DAILY Patient taking differently: Take 0.25 mcg by mouth every morning.  08/16/18   Dettinger, Fransisca Kaufmann, MD  clonazePAM (KLONOPIN) 1 MG tablet TAKE 1/2 TABLET ONCE DAILY AS NEEDED FOR ANXIETY Patient taking differently: Take 0.5 mg by mouth at bedtime. TAKE 1/2 TABLET ONCE DAILY AS NEEDED FOR ANXIETY 08/16/18   Dettinger, Fransisca Kaufmann, MD  diltiazem (CARDIZEM CD) 240 MG 24 hr capsule TAKE (1) CAPSULE DAILY Patient taking differently: Take 240 mg by mouth every morning.  08/16/18   Dettinger, Fransisca Kaufmann, MD  ferrous sulfate 325 (65 FE) MG tablet Take 325 mg by mouth every Monday, Wednesday, and Friday.    [provider]  hydroxypropyl methylcellulose / hypromellose (ISOPTO TEARS / GONIOVISC) 2.5 % ophthalmic solution Place 1 drop into both eyes daily as needed for dry eyes.    [provider]  iron polysaccharides (FERREX 150) 150 MG capsule Take 1 capsule (150 mg total) by mouth daily. (Needs to be seen) Patient taking differently: Take 150 mg by mouth every morning. (Needs to be seen) 08/16/18   Dettinger, Fransisca Kaufmann, MD  loratadine (CLARITIN) 10 MG tablet TAKE 1 TABLET DAILY Patient taking differently: Take 10 mg by mouth at bedtime.  09/18/18   Dettinger, Fransisca Kaufmann, MD  Melatonin 3 MG TABS Take 3 mg by mouth at bedtime.     [provider]  Multiple Vitamin (MULTIVITAMIN WITH MINERALS) TABS tablet Take 1 tablet by mouth at bedtime.     [provider]  Multiple Vitamins-Minerals (PRESERVISION AREDS 2 PO) Take 1 tablet by mouth 2 (two) times daily.     [provider]  omega-3 acid ethyl esters (LOVAZA) 1 g capsule Take 2 g by mouth 2 (two) times daily.    [provider]  oxyCODONE (ROXICODONE) 5 MG immediate release tablet Take 1 tablet (5 mg total) by mouth every 6 (six) hours as needed. Patient not taking: Reported on 10/23/2018 10/08/18   Gabriel Earing, PA-C  pantoprazole (PROTONIX) 40 MG tablet TAKE (1) TABLET TWICE A DAY. Patient  taking differently: Take 40 mg by mouth 2 (two) times daily.  09/12/18   Dettinger, Fransisca Kaufmann, MD  terazosin (HYTRIN) 2 MG capsule TAKE 1 CAPSULE AT BEDTIME Patient taking differently: Take 2 mg by mouth at bedtime.  05/20/18   Dettinger, Fransisca Kaufmann, MD  tiotropium (SPIRIVA) 18 MCG inhalation capsule Place 18 mcg into inhaler and inhale daily as needed (for shortness of breath).     [provider]     Family History  Problem Relation Age of Onset  . Diabetes Father   . Heart disease Father 8       MI  . Heart attack Father   . Heart attack Mother   . Heart disease Brother   . Cancer Brother        lung  . Lung cancer Brother   . Heart disease Brother   . Lung cancer Sister   . Cancer Sister        lung  . Heart disease Brother   .  Heart disease Brother     Social History   Socioeconomic History  . Marital status: Married    Spouse name: Not on file  . Number of children: 0  . Years of education: Not on file  . Highest education level: Not on file  Occupational History  . Occupation: retired  Scientific laboratory technician  . Financial resource strain: Not on file  . Food insecurity:    Worry: Not on file    Inability: Not on file  . Transportation needs:    Medical: Not on file    Non-medical: Not on file  Tobacco Use  . Smoking status: Current Some Day Smoker    Packs/day: 0.50    Years: 62.00    Pack years: 31.00    Types: Cigarettes, Cigars    Last attempt to quit: 12/28/2016    Years since quitting: 1.8  . Smokeless tobacco: Never Used  . Tobacco comment: "very little"  Substance and Sexual Activity  . Alcohol use: No  . Drug use: No  . Sexual activity: Never  Lifestyle  . Physical activity:    Days per week: 0 days    Minutes per session: 0 min  . Stress: Not at all  Relationships  . Social connections:    Talks on phone: Never    Gets together: Once a week    Attends religious service: Never    Active member of club or organization: No    Attends meetings  of clubs or organizations: Never    Relationship status: Widowed  Other Topics Concern  . 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       Review of Systems: A 12 point ROS discussed and pertinent positives are indicated in the HPI above.  All other systems are negative.  Review of Systems  Vital Signs: There were no vitals taken for this visit.  Physical Exam Vitals signs reviewed.  Constitutional:      Appearance: Normal appearance.  HENT:     Head: Normocephalic and atraumatic.  Eyes:     Extraocular Movements: Extraocular movements intact.  Neck:     Musculoskeletal: Normal range of motion.  Cardiovascular:     Rate and Rhythm: Normal rate and regular rhythm.  Pulmonary:     Effort: Pulmonary effort is normal.     Breath sounds: Wheezing present.  Abdominal:     Palpations: Abdomen is soft.  Musculoskeletal: Normal range of motion.  Skin:    General: Skin is warm and dry.  Neurological:     General: No focal deficit present.     Mental Status: He is alert and oriented to person, place, and time.  Psychiatric:        Mood and Affect: Mood normal.        Behavior: Behavior normal.        Thought Content: Thought content normal.        Judgment: Judgment normal.     Imaging: Dg Chest 2 View  Result Date: 10/23/2018 CLINICAL DATA:  Chest pain and weakness EXAM: CHEST - 2 VIEW COMPARISON:  03/19/2018 FINDINGS: Bilateral diffuse mild interstitial and patchy alveolar airspace opacities. Two areas of nodular airspace disease in the left upper lobe and left lower lobe. Small bilateral pleural effusions. No pneumothorax. Stable cardiomediastinal silhouette. No aggressive osseous lesion. IMPRESSION: 1. Bilateral diffuse mild interstitial and alveolar airspace disease with 2 nodular areas of  airspace disease in left upper lobe and left lower lobe. Small bilateral pleural effusions. Differential  considerations include mild pulmonary edema versus multilobar pneumonia. Electronically Signed   By: Kathreen Devoid   On: 10/23/2018 15:38   [place] Ue Vein Mapping Left (pre-op Avf)  Result Date: 09/30/2018 CLINICAL DATA:  Chronic kidney disease, preop planning for hemodialysis fistula. No pacemaker. No prior mastectomy. Right hand dominant. No history of DVT. Positive anticoagulation use. EXAM: [place] EXTREM UP VEIN MAPPING COMPARISON:  None. FINDINGS: RIGHT ARTERIES Wrist Radial Artery: Size 3.55mm Waveform Normal Wrist Ulnar Artery: Size 3.19mm Waveform Normal Prox. Forearm Radial Artery: Size 2.26mm Waveform Normal Brachial Artery at antecubital fossa: Size 4.29mm Waveform Normal RIGHT VEINS Forearm Cephalic Vein: Prox 5.3MI Distal 1.70mm Depth 6.8EH Upper Arm Cephalic Vein: Prox 2.1YY Distal 1.33mm Depth 4.8GN Upper Arm Basilic Vein: Prox 0.0BB Distal 2.70mm Depth 1.78mm Upper Arm Brachial Vein: Prox 44mm Distal 3.53mm Depth 2.83mm ADDITIONAL RIGHT VEINS Axillary Vein:  5.40mm Subclavian Vein: Patient: Yes Respiratory Phasicity: Present Internal Jugular Vein: Patent: Yes    Respiratory Phasicity: Present Branches > 2 mm: None demonstrated LEFT ARTERIES Wrist Radial Artery: Size 2.60mm Waveform Triphasic Wrist Ulnar Artery: Size 2.71mm Waveform Triphasic Prox. Forearm Radial Artery: Size 2.50mm Waveform Triphasic in Upper Arm Brachial Artery: Size 4.92mm Waveform Triphasic LEFT VEINS Forearm Cephalic Vein: Prox 0.4UG Distal 1.47mm Depth 79mm Upper Arm Cephalic Vein: Prox 8.9VQ Distal 1.81mm Depth 9.4HW Upper Arm Basilic Vein: Prox 3.8UE Distal 3.18mm Depth 5.58mm Upper Arm Brachial Vein: Prox 3.42mm Distal 3.48mm Depth 6.30mm ADDITIONAL LEFT VEINS Axillary Vein:  6.28mm Subclavian Vein: Patient: Yes Respiratory Phasicity: Present Internal Jugular Vein: Patent: Yes    Respiratory Phasicity: Present Branches > 2 mm: None demonstrated IMPRESSION: 1. Approachable bilateral upper extremity anatomy for fistula creation. No DVT or other unexpected  findings. Electronically Signed   By: Lucrezia Europe M.D.   On: 09/30/2018 12:32   [place] Ue Vein Mapping Right (pre-op Avf)  Result Date: 09/30/2018 CLINICAL DATA:  Chronic kidney disease, preop planning for hemodialysis fistula. No pacemaker. No prior mastectomy. Right hand dominant. No history of DVT. Positive anticoagulation use. EXAM: [place] EXTREM UP VEIN MAPPING COMPARISON:  None. FINDINGS: RIGHT ARTERIES Wrist Radial Artery: Size 3.69mm Waveform Normal Wrist Ulnar Artery: Size 3.30mm Waveform Normal Prox. Forearm Radial Artery: Size 2.62mm Waveform Normal Brachial Artery at antecubital fossa: Size 4.2mm Waveform Normal RIGHT VEINS Forearm Cephalic Vein: Prox 2.8MK Distal 1.26mm Depth 3.4JZ Upper Arm Cephalic Vein: Prox 7.9XT Distal 1.38mm Depth 0.5WP Upper Arm Basilic Vein: Prox 7.9YI Distal 2.64mm Depth 1.29mm Upper Arm Brachial Vein: Prox 29mm Distal 3.22mm Depth 2.17mm ADDITIONAL RIGHT VEINS Axillary Vein:  5.17mm Subclavian Vein: Patient: Yes Respiratory Phasicity: Present Internal Jugular Vein: Patent: Yes    Respiratory Phasicity: Present Branches > 2 mm: None demonstrated LEFT ARTERIES Wrist Radial Artery: Size 2.40mm Waveform Triphasic Wrist Ulnar Artery: Size 2.64mm Waveform Triphasic Prox. Forearm Radial Artery: Size 2.9mm Waveform Triphasic in Upper Arm Brachial Artery: Size 4.19mm Waveform Triphasic LEFT VEINS Forearm Cephalic Vein: Prox 0.1KP Distal 1.75mm Depth 32mm Upper Arm Cephalic Vein: Prox 5.3ZS Distal 1.11mm Depth 8.2LM Upper Arm Basilic Vein: Prox 7.8ML Distal 3.82mm Depth 5.45mm Upper Arm Brachial Vein: Prox 3.4mm Distal 3.30mm Depth 6.22mm ADDITIONAL LEFT VEINS Axillary Vein:  6.61mm Subclavian Vein: Patient: Yes Respiratory Phasicity: Present Internal Jugular Vein: Patent: Yes    Respiratory Phasicity: Present Branches > 2 mm: None demonstrated IMPRESSION: 1. Approachable bilateral upper extremity anatomy for fistula creation. No DVT or other  unexpected findings. Electronically Signed   By: Lucrezia Europe M.D.   On:  09/30/2018 12:32    Labs:  CBC: Recent Labs    10/24/18 0340 10/25/18 0409 10/26/18 0618 10/28/18 1345  WBC 8.5 6.2 12.7* 6.9  HGB 7.2* 7.8* 9.5* 8.7*  HCT 22.7* 25.2* 30.2* 27.7*  PLT 181 208 227 207    COAGS: Recent Labs    03/19/18 1210 10/08/18 0744 10/23/18 1803 10/28/18 1345  INR 1.30 1.20 1.86 1.18    BMP: Recent Labs    10/25/18 0409 10/26/18 0618 10/27/18 0614 10/29/18 0436  NA 140 138 134* 138  K 4.7 4.6 4.5 4.8  CL 107 103 100 104  CO2 22 23 24 26   GLUCOSE 178* 159* 123* 103*  BUN 90* 98* 79* 66*  CALCIUM 8.4* 8.6* 8.5* 8.1*  CREATININE 5.12* 5.11* 3.82* 3.47*  GFRNONAA 10* 10* 14* 16*  GFRAA 11* 11* 16* 18*    LIVER FUNCTION TESTS: Recent Labs    03/16/18 2115 03/18/18 0843 10/22/18 1546 10/24/18 0340 10/25/18 0409 10/26/18 0618  BILITOT 1.1 1.0 0.5 0.9  --   --   AST 17 15 12 16   --   --   ALT 13* 12* 13 16  --   --   ALKPHOS 99 70 130* 99  --   --   PROT 6.0* 4.8* 5.3* 5.1*  --   --   ALBUMIN 2.8* 2.3* 3.4* 2.6* 2.7* 2.9*    TUMOR MARKERS: No results for input(s): AFPTM, CEA, CA199, CHROMGRNA in the last 8760 hours.  Assessment and Plan:  ESRD now needing hemodialysis.  Non-mature left arm fistula.  Will place tunneled HD catheter today by Dr. Anselm Pancoast.  Risks and benefits discussed with the patient including, but not limited to bleeding, infection, vascular injury, pneumothorax which may require chest tube placement, air embolism or even death  All of the patient's questions were answered, patient is agreeable to proceed. Consent signed and in chart.  Thank you for this interesting consult.  I greatly enjoyed meeting Don [name] and look forward to participating in their care.  A copy of this report was sent to the requesting provider on this date.  Electronically Signed: Murrell Redden, PA-C   10/29/2018, 11:31 AM      I spent a total of 20 Minutes in face to face in clinical consultation, greater than 50% of  which was counseling/coordinating care for HD catheter.

## 2018-10-29 NOTE — Progress Notes (Signed)
[facility] Progress Note   Assessment/ Plan:   1.  End-stage renal disease-from AKI on progressive chronic kidney disease: Initiated on hemodialysis and has received 2 treatments so far; scheduled to undergo [facility] placement today at [facility] IR as left for stage BVT AVF matures.  His labs do not have any acute electrolyte abnormalities this morning and volume status appears to be acceptable.  Will order for next hemodialysis tomorrow as we await outpatient placement to dialysis unit/SNF. 2.  Acute hypoxic respiratory failure: Suspected to be multifactorial from exacerbation of diastolic heart failure, COPD exacerbation (without infection) with decreasing renal function.  Improved with HD/treatment of COPD exacerbation. 3.  Anemia: Secondary to anemia of chronic kidney disease and with improvement noted status post PRBC.  On intravenous iron with Eliquis currently on hold. 4.  Atrial fibrillation: Rate controlled at this time-on bisoprolol, Eliquis currently on hold-anticipate restart after [facility] placement. 5.  Secondary hyperparathyroidism: Phosphorus and calcium levels acceptable.  On calcitriol for PTH suppression. 6.  Hypertension: Blood pressure under fair control, monitor with ultrafiltration at hemodialysis. 7.  Nutrition: Continue current diet with fluid restriction and renal vitamin.  Subjective:   Reports to have tolerated dialysis well yesterday, scheduled for tunneled dialysis catheter placement later today.  Overnight noted to be tachycardic with hypotension.   Objective:   BP (!) 88/52 (BP Location: Right Arm)   Pulse 68   Temp 98.4 F (36.9 C) (Oral)   Resp 18   Ht 5\' 9"  (1.753 m)   Wt 64.9 kg   SpO2 94%   BMI 21.13 kg/m   Intake/Output Summary (Last 24 hours) at 10/29/2018 0821 Last data filed at 10/29/2018 0420 Gross per 24 hour  Intake 1795 ml  Output 500 ml  Net 1295 ml   Weight change: 0.1 kg  Physical Exam: Gen: Comfortably sleeping in bed CVS: Pulse  irregularly irregular, normal rate, ESM over apex Resp: Poor inspiratory effort with decreased breath sounds over bases, no rales/rhonchi Abd: Soft, flat, nontender.  Right femoral dialysis catheter Ext: Mild ankle edema.  Status post first stage left upper arm BVT-palpable thrill  Imaging: No results found.  Labs: BMET Recent Labs  Lab 10/22/18 1546 10/23/18 1602 10/24/18 0340 10/25/18 0409 10/26/18 0618 10/27/18 0614 10/29/18 0436  NA 142 140 142 140 138 134* 138  K 4.9 5.0 4.9 4.7 4.6 4.5 4.8  CL 108* 112* 114* 107 103 100 104  CO2 19* 19* 20* 22 23 24 26   GLUCOSE 99 91 105* 178* 159* 123* 103*  BUN 61* 75* 77* 90* 98* 79* 66*  CREATININE 4.07* 4.39* 4.39* 5.12* 5.11* 3.82* 3.47*  CALCIUM 8.5* 8.4* 8.2* 8.4* 8.6* 8.5* 8.1*  PHOS  --   --   --  4.6 5.0*  --   --    CBC Recent Labs  Lab 10/24/18 0340 10/25/18 0409 10/26/18 0618 10/28/18 1345  WBC 8.5 6.2 12.7* 6.9  HGB 7.2* 7.8* 9.5* 8.7*  HCT 22.7* 25.2* 30.2* 27.7*  MCV 103.7* 100.8* 100.7* 100.7*  PLT 181 208 227 207    Medications:    . atorvastatin  20 mg Oral QHS  . bisoprolol  10 mg Oral q morning - 10a  . budesonide (PULMICORT) nebulizer solution  0.5 mg Nebulization BID  . calcitRIOL  0.25 mcg Oral q morning - 10a  . Chlorhexidine Gluconate Cloth  6 each Topical Q0600  . Chlorhexidine Gluconate Cloth  6 each Topical Q0600  . clonazePAM  0.5 mg Oral  QHS  . dextromethorphan-guaiFENesin  1 tablet Oral BID  . diltiazem  240 mg Oral q morning - 10a  . epoetin (EPOGEN/PROCRIT) injection  10,000 Units Intravenous Q M,W,F-HD  . feeding supplement (NEPRO CARB STEADY)  237 mL Oral BID BM  . ipratropium  0.5 mg Nebulization Q6H WA  . levalbuterol  0.63 mg Nebulization Q6H WA  . loratadine  10 mg Oral QHS  . multivitamin  1 tablet Oral QHS  . mupirocin ointment  1 application Nasal BID  . nicotine  14 mg Transdermal Daily  . pantoprazole  40 mg Oral BID  . polyethylene glycol  17 g Oral Daily  . terazosin   2 mg Oral QHS   Elmarie Shiley, MD 10/29/2018, 8:21 AM

## 2018-10-30 DIAGNOSIS — J441 Chronic obstructive pulmonary disease with (acute) exacerbation: Secondary | ICD-10-CM

## 2018-10-30 DIAGNOSIS — Z992 Dependence on renal dialysis: Secondary | ICD-10-CM

## 2018-10-30 DIAGNOSIS — N186 End stage renal disease: Secondary | ICD-10-CM

## 2018-10-30 DIAGNOSIS — I5033 Acute on chronic diastolic (congestive) heart failure: Secondary | ICD-10-CM

## 2018-10-30 LAB — CBC
HCT: 27.6 % — ABNORMAL LOW (ref 39.0–52.0)
Hemoglobin: 8.5 g/dL — ABNORMAL LOW (ref 13.0–17.0)
MCH: 32.2 pg (ref 26.0–34.0)
MCHC: 30.8 g/dL (ref 30.0–36.0)
MCV: 104.5 fL — ABNORMAL HIGH (ref 80.0–100.0)
NRBC: 0.3 % — AB (ref 0.0–0.2)
Platelets: 187 10*3/uL (ref 150–400)
RBC: 2.64 MIL/uL — ABNORMAL LOW (ref 4.22–5.81)
RDW: 17.2 % — ABNORMAL HIGH (ref 11.5–15.5)
WBC: 6.9 10*3/uL (ref 4.0–10.5)

## 2018-10-30 LAB — RENAL FUNCTION PANEL
Albumin: 2.3 g/dL — ABNORMAL LOW (ref 3.5–5.0)
Anion gap: 8 (ref 5–15)
BUN: 74 mg/dL — ABNORMAL HIGH (ref 8–23)
CO2: 24 mmol/L (ref 22–32)
Calcium: 8.1 mg/dL — ABNORMAL LOW (ref 8.9–10.3)
Chloride: 102 mmol/L (ref 98–111)
Creatinine, Ser: 4.09 mg/dL — ABNORMAL HIGH (ref 0.61–1.24)
GFR calc Af Amer: 15 mL/min — ABNORMAL LOW (ref >60)
GFR calc non Af Amer: 13 mL/min — ABNORMAL LOW (ref >60)
Glucose, Bld: 176 mg/dL — ABNORMAL HIGH (ref 70–99)
Phosphorus: 3.5 mg/dL (ref 2.5–4.6)
Potassium: 4.9 mmol/L (ref 3.5–5.1)
Sodium: 134 mmol/L — ABNORMAL LOW (ref 135–145)

## 2018-10-30 LAB — GLUCOSE, CAPILLARY: Glucose-Capillary: 89 mg/dL (ref 70–99)

## 2018-10-30 MED ORDER — DILTIAZEM HCL 60 MG PO TABS
240.0000 mg | ORAL_TABLET | Freq: Once | ORAL | Status: AC
  Administered 2018-10-30: 240 mg via ORAL
  Filled 2018-10-30: qty 4

## 2018-10-30 MED ORDER — HEPARIN SODIUM (PORCINE) 1000 UNIT/ML DIALYSIS
40.0000 [IU]/kg | INTRAMUSCULAR | Status: DC | PRN
  Administered 2018-10-30: 2600 [IU] via INTRAVENOUS_CENTRAL
  Filled 2018-10-30: qty 3

## 2018-10-30 MED ORDER — HEPARIN SODIUM (PORCINE) 1000 UNIT/ML IJ SOLN
3800.0000 [IU] | INTRAMUSCULAR | Status: DC
  Administered 2018-10-30: 3800 [IU]
  Filled 2018-10-30: qty 4

## 2018-10-30 NOTE — Procedures (Signed)
     HEMODIALYSIS TREATMENT NOTE (HD#3 - Wednesday 10-30-18):  3.5 hour low-heparin dialysis completed via right IJ tunneled catheter. Exit site unremarkable.  Goal met: 0.5L removed without interruption in ultrafiltration.  Hemodynamically stable throughout session.  All blood was returned.  Tunneled cath tolerated prescribed blood flow rate with stable pressures.  Dr. Arnoldo Morale to remove femoral catheter tomorrow morning.  Rockwell Alexandria, RN

## 2018-10-30 NOTE — Progress Notes (Signed)
[facility] Progress Note   Assessment/ Plan:   1.  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  He is now status post right IJ TDC placed at [facility] IR yesterday.  Awaiting outpatient dialysis unit placement along with SNF placement.  Will order for removal of femoral temporary dialysis catheter. 2.  Acute hypoxic respiratory failure: Likely from a combination of COPD exacerbation and exacerbation of diastolic heart failure in the setting of worsening renal function.  Improving gradually with dialysis/bronchodilators. 3.  Anemia: Secondary to anemia of chronic kidney disease and with improvement noted status post PRBC.  Remains on intravenous iron and without overt loss. 4.  Atrial fibrillation: Overnight noted to have rapid ventricular response, remains on bisoprolol and otherwise mildly hypotensive.  Anticipate Eliquis to be restarted today. 5.  Secondary hyperparathyroidism: Phosphorus and calcium levels acceptable.  On calcitriol for PTH suppression. 6.  Hypertension: Blood pressure under fair control, monitor with ultrafiltration at hemodialysis. 7.  Nutrition: Continue current diet with fluid restriction and renal vitamin.  Subjective:   Complains of some discomfort over right IJ TDC site placed yesterday.  Episode of A. fib with RVR noted overnight.   Objective:   BP (!) 98/56   Pulse 80   Temp 97.7 F (36.5 C) (Oral)   Resp 19   Ht 5\' 9"  (1.753 m)   Wt 66.4 kg   SpO2 96%   BMI 21.62 kg/m   Intake/Output Summary (Last 24 hours) at 10/30/2018 0827 Last data filed at 10/30/2018 0300 Gross per 24 hour  Intake 360 ml  Output 400 ml  Net -40 ml   Weight change: -0.3 kg  Physical Exam: Gen: Comfortably sitting up on the side of his bed, eating breakfast CVS: Pulse irregularly irregular, currently normal rate, ejection systolic murmur over  apex Resp: Fine rales right base with expiratory rhonchi bilaterally Abd: Soft, flat, nontender.  Right femoral temporary dialysis catheter. Ext: Trace ankle edema.  Status post first stage left upper arm BVT-palpable thrill  Imaging: Ir Fluoro Guide Cv Line Right  Result Date: 10/29/2018 INDICATION: 77 year old with end-stage renal disease and maturing AV arm access. Patient needs a tunneled catheter for hemodialysis. EXAM: FLUOROSCOPIC AND ULTRASOUND GUIDED PLACEMENT OF A TUNNELED DIALYSIS CATHETER Physician: Stephan Minister. Anselm Pancoast, MD MEDICATIONS: Ancef 2 g; The antibiotic was administered within an appropriate time interval prior to skin puncture. ANESTHESIA/SEDATION: Versed 2 mg IV; Fentanyl 100 mcg IV; Moderate Sedation Time:  22 minutes The patient was continuously monitored during the procedure by the interventional radiology nurse under my direct supervision. FLUOROSCOPY TIME:  Fluoroscopy Time: 30 seconds, 1 mGy COMPLICATIONS: None immediate. PROCEDURE: The procedure was explained to the patient. The risks and benefits of the procedure were discussed and the patient's questions were addressed. Informed consent was obtained from the patient. The patient was placed supine on the interventional table. Ultrasound confirmed a patent right internal jugular vein. Ultrasound images were obtained for documentation. The right side of the neck and chest was prepped and draped in a sterile fashion. The right neck was anesthetized with 1% lidocaine. Maximal barrier sterile technique was utilized including caps, mask, sterile gowns, sterile gloves, sterile drape, hand hygiene and skin antiseptic. A small incision was made with #11 blade scalpel. A 21 gauge needle directed into the right internal jugular vein with ultrasound guidance. A micropuncture dilator set was placed. A 23 cm tip to cuff Palindrome catheter  was selected. The skin below the right clavicle was anesthetized and a small incision was made with an #11  blade scalpel. A subcutaneous tunnel was formed to the vein dermatotomy site. The catheter was brought through the tunnel. The vein dermatotomy site was dilated to accommodate a peel-away sheath. The catheter was placed through the peel-away sheath and directed into the central venous structures. The tip of the catheter was placed at the SVC/right atrium junction with fluoroscopy. Fluoroscopic images were obtained for documentation. Both lumens were found to aspirate and flush well. The proper amount of heparin was flushed in both lumens. The vein dermatotomy site was closed using a single layer of absorbable suture and Dermabond. The catheter was secured to the skin using Prolene suture. IMPRESSION: Successful placement of a right jugular tunneled dialysis catheter using ultrasound and fluoroscopic guidance. Electronically Signed   By: Markus Daft M.D.   On: 10/29/2018 13:42   Ir US Guide Vasc Access Right  Result Date: 10/29/2018 INDICATION: 77 year old with end-stage renal disease and maturing AV arm access. Patient needs a tunneled catheter for hemodialysis. EXAM: FLUOROSCOPIC AND ULTRASOUND GUIDED PLACEMENT OF A TUNNELED DIALYSIS CATHETER Physician: Stephan Minister. Anselm Pancoast, MD MEDICATIONS: Ancef 2 g; The antibiotic was administered within an appropriate time interval prior to skin puncture. ANESTHESIA/SEDATION: Versed 2 mg IV; Fentanyl 100 mcg IV; Moderate Sedation Time:  22 minutes The patient was continuously monitored during the procedure by the interventional radiology nurse under my direct supervision. FLUOROSCOPY TIME:  Fluoroscopy Time: 30 seconds, 1 mGy COMPLICATIONS: None immediate. PROCEDURE: The procedure was explained to the patient. The risks and benefits of the procedure were discussed and the patient's questions were addressed. Informed consent was obtained from the patient. The patient was placed supine on the interventional table. Ultrasound confirmed a patent right internal jugular vein. Ultrasound  images were obtained for documentation. The right side of the neck and chest was prepped and draped in a sterile fashion. The right neck was anesthetized with 1% lidocaine. Maximal barrier sterile technique was utilized including caps, mask, sterile gowns, sterile gloves, sterile drape, hand hygiene and skin antiseptic. A small incision was made with #11 blade scalpel. A 21 gauge needle directed into the right internal jugular vein with ultrasound guidance. A micropuncture dilator set was placed. A 23 cm tip to cuff Palindrome catheter was selected. The skin below the right clavicle was anesthetized and a small incision was made with an #11 blade scalpel. A subcutaneous tunnel was formed to the vein dermatotomy site. The catheter was brought through the tunnel. The vein dermatotomy site was dilated to accommodate a peel-away sheath. The catheter was placed through the peel-away sheath and directed into the central venous structures. The tip of the catheter was placed at the SVC/right atrium junction with fluoroscopy. Fluoroscopic images were obtained for documentation. Both lumens were found to aspirate and flush well. The proper amount of heparin was flushed in both lumens. The vein dermatotomy site was closed using a single layer of absorbable suture and Dermabond. The catheter was secured to the skin using Prolene suture. IMPRESSION: Successful placement of a right jugular tunneled dialysis catheter using ultrasound and fluoroscopic guidance. Electronically Signed   By: Markus Daft M.D.   On: 10/29/2018 13:42    Labs: BMET Recent Labs  Lab 10/23/18 1602 10/24/18 0340 10/25/18 0409 10/26/18 0618 10/27/18 0614 10/29/18 0436  NA 140 142 140 138 134* 138  K 5.0 4.9 4.7 4.6 4.5 4.8  CL 112* 114* 107 103  100 104  CO2 19* 20* 22 23 24 26   GLUCOSE 91 105* 178* 159* 123* 103*  BUN 75* 77* 90* 98* 79* 66*  CREATININE 4.39* 4.39* 5.12* 5.11* 3.82* 3.47*  CALCIUM 8.4* 8.2* 8.4* 8.6* 8.5* 8.1*  PHOS  --    --  4.6 5.0*  --   --    CBC Recent Labs  Lab 10/24/18 0340 10/25/18 0409 10/26/18 0618 10/28/18 1345  WBC 8.5 6.2 12.7* 6.9  HGB 7.2* 7.8* 9.5* 8.7*  HCT 22.7* 25.2* 30.2* 27.7*  MCV 103.7* 100.8* 100.7* 100.7*  PLT 181 208 227 207    Medications:    . atorvastatin  20 mg Oral QHS  . bisoprolol  10 mg Oral q morning - 10a  . budesonide (PULMICORT) nebulizer solution  0.5 mg Nebulization BID  . calcitRIOL  0.25 mcg Oral q morning - 10a  . clonazePAM  0.5 mg Oral QHS  . dextromethorphan-guaiFENesin  1 tablet Oral BID  . diltiazem  240 mg Oral q morning - 10a  . epoetin (EPOGEN/PROCRIT) injection  10,000 Units Intravenous Q M,W,F-HD  . feeding supplement (NEPRO CARB STEADY)  237 mL Oral BID BM  . ipratropium  0.5 mg Nebulization Q6H WA  . levalbuterol  0.63 mg Nebulization Q6H WA  . loratadine  10 mg Oral QHS  . multivitamin  1 tablet Oral QHS  . mupirocin ointment  1 application Nasal BID  . nicotine  14 mg Transdermal Daily  . pantoprazole  40 mg Oral BID  . polyethylene glycol  17 g Oral Daily  . terazosin  2 mg Oral QHS   Elmarie Shiley, MD 10/30/2018, 8:27 AM

## 2018-10-30 NOTE — Clinical Social Work Note (Signed)
LCSW sent dialysis flowsheets (again) as it was advised that flowsheets sent yesterday were not received.   LCSW advised that the [facility] facility was appropriate.    Zairah Arista, Clydene Pugh, LCSW

## 2018-10-30 NOTE — Progress Notes (Signed)
PROGRESS NOTE  Don [name]:[id number] DOB: Feb 11, 1941 DOA: 10/23/2018 PCP: Dettinger, Fransisca Kaufmann, MD  Brief History:  77 year old male with a history of COPD, atrial fibrillation on apixaban, tobacco abuse, hypertension, prostate cancer, CKD stage IV presenting with shortness of breath, lower extremity edema, orthopnea.  Patient is also complaining of generalized weakness and fatigue.  Upon presentation, the patient was noted to be fluid overloaded and found to have acute on chronic diastolic CHF with acute on chronic renal failure.  The patient was initially started on diuretics.  Nephrology was consulted.  [name] renal function continued to decline, and the patient was initiated on dialysis.  Tunneled dialysis catheter was placed on 10/30/2018.  With assistance of social work, an outpatient dialysis chair was obtained.  Physical therapy consult recommended skilled nursing facility.  Assessment/Plan: Acute respiratory failure with hypoxia ([place]):  -Due to fluid overload from worsening renal function, COPD exacerbation and acute on chronic diastolic heart failure. -Patient requiring oxygen supplementation initially-->RA -Continue volume management with hemodialysis at this moment. -outstanding improvement to the use of steroids and nebulizer treatment.   -personally reviewed CXR--no consolidation  Acute on chronic diastolic CHF -Patient likely had a component of decompensated CHF with decrease in EF from 65% to 55% -11/11/2018 echo EF 55%, no WMA, mild left ear, trivial TR -Volume management per HD  COPD exacerbation -Continue Pulmicort and bronchodilators -Patient finished a course of steroids -No wheezing presently  anemia of chronic kidney disease -presented with Hgb 7.8 and +FOBT -Baseline hemoglobin range in the 8.2-8.6 -Patient received 1 unit of PRBC during this admission; Hemoglobin has remained stable since then.   -No further signs of acute bleeding  appreciated -Epogen and IV iron as per nephrology recommendations. -03/22/18 EGD--erosive gastropathy -consult GI -restart apixaban if no further work up  gastroesophageal reflux disease -continue PPI  Chronic atrial fibrillation -Heart rate is fairly well controlled -Continue Cardizem and bisoprolol -had RVR overnight due to not getting cardizem yesterday -Restart apixaban if no further GI work-up -rate has remained stable, so will d/c telemetry  BPH -Continue terazosin for now.  acute on chronic renal failure: stage 4-->ESRD  -Nephrology appreciated -Patient has been initiated on hemodialysis -Continue calcitriol -tunneled cath to be placed 10/29/18 -Last HD 12/18 -Continue Epogen and IV iron as per nephrology recommendations. -Follow daily weights and strict intake and output.  hyperlipidemia  -continue statins.  elevation of troponin -due to demand ischemia with acute diastolic heart failure on top of chronic renal failure -trend is flat -Currently not having any chest pain -10/25/2018 echo EF 55%, no WMA, mild left ear, trivial TR  tobacco abuse -Extensive cessation counseling has been provided -Continue nicotine patch.    physical deconditioning -need skilled nursing facility at discharge with anticipated transition to assisted living facility at some point.    Disposition Plan:   SNF 12/19 or 12/20 Family Communication:   No Family at bedside  Consultants:  Renal; GI  Code Status:  FULL   DVT Prophylaxis:  SCDs   Procedures: As Listed in Progress Note Above  Antibiotics: None   Total time spent 35 minutes.  Greater than 50% spent face to face counseling and coordinating care.     Subjective: Patient denies fevers, chills, headache, chest pain, dyspnea, nausea, vomiting, diarrhea, abdominal pain, dysuria, hematuria, hematochezia, and melena.   Objective: Vitals:   10/30/18 1215 10/30/18 1245 10/30/18 1315 10/30/18 1345  BP:  124/62 110/67 111/61 [name])  126/58  Pulse: 86 94 93 95  Resp:      Temp:      TempSrc:      SpO2:      Weight:      Height:        Intake/Output Summary (Last 24 hours) at 10/30/2018 1410 Last data filed at 10/30/2018 1100 Gross per 24 hour  Intake 600 ml  Output 1100 ml  Net -500 ml   Weight change: -0.3 kg Exam:   General:  Pt is alert, follows commands appropriately, not in acute distress  HEENT: No icterus, No thrush, No neck mass, [place]/AT  Cardiovascular: IRRR, S1/S2, no rubs, no gallops  Respiratory: Fine bibasilar rales but no wheezing.  Air movement  Abdomen: Soft/+BS, non tender, non distended, no guarding  Extremities: No edema, No lymphangitis, No petechiae, No rashes, no synovitis   Data Reviewed: I have personally reviewed following labs and imaging studies Basic Metabolic Panel: Recent Labs  Lab 10/25/18 0409 10/26/18 0618 10/27/18 0614 10/29/18 0436 10/30/18 1011  NA 140 138 134* 138 134*  K 4.7 4.6 4.5 4.8 4.9  CL 107 103 100 104 102  CO2 22 23 24 26 24   GLUCOSE 178* 159* 123* 103* 176*  BUN 90* 98* 79* 66* 74*  CREATININE 5.12* 5.11* 3.82* 3.47* 4.09*  CALCIUM 8.4* 8.6* 8.5* 8.1* 8.1*  PHOS 4.6 5.0*  --   --  3.5   Liver Function Tests: Recent Labs  Lab 10/24/18 0340 10/25/18 0409 10/26/18 0618 10/30/18 1011  AST 16  --   --   --   ALT 16  --   --   --   ALKPHOS 99  --   --   --   BILITOT 0.9  --   --   --   PROT 5.1*  --   --   --   ALBUMIN 2.6* 2.7* 2.9* 2.3*   No results for input(s): LIPASE, AMYLASE in the last 168 hours. No results for input(s): AMMONIA in the last 168 hours. Coagulation Profile: Recent Labs  Lab 10/23/18 1803 10/28/18 1345  INR 1.86 1.18   CBC: Recent Labs  Lab 10/24/18 0340 10/25/18 0409 10/26/18 0618 10/28/18 1345 10/30/18 1011  WBC 8.5 6.2 12.7* 6.9 6.9  HGB 7.2* 7.8* 9.5* 8.7* 8.5*  HCT 22.7* 25.2* 30.2* 27.7* 27.6*  MCV 103.7* 100.8* 100.7* 100.7* 104.5*  PLT 181 208 227 207 187    Cardiac Enzymes: Recent Labs  Lab 10/23/18 1602 10/23/18 2206 10/24/18 0340 10/24/18 0934  TROPONINI 0.06* 0.05* 0.04* 0.03*   BNP: Invalid input(s): POCBNP CBG: Recent Labs  Lab 10/26/18 1625 10/27/18 0746 10/29/18 0458 10/30/18 0524  GLUCAP 176* 119* 106* 89   HbA1C: No results for input(s): HGBA1C in the last 72 hours. Urine analysis:    Component Value Date/Time   COLORURINE YELLOW 03/15/2018 2110   APPEARANCEUR HAZY (A) 03/15/2018 2110   LABSPEC 1.013 03/15/2018 2110   PHURINE 5.0 03/15/2018 2110   GLUCOSEU NEGATIVE 03/15/2018 2110   HGBUR NEGATIVE 03/15/2018 2110   BILIRUBINUR NEGATIVE 03/15/2018 2110   BILIRUBINUR neg 10/07/2014 1122   KETONESUR NEGATIVE 03/15/2018 2110   PROTEINUR 100 (A) 03/15/2018 2110   UROBILINOGEN negative 10/07/2014 1122   UROBILINOGEN 0.2 04/03/2014 2308   NITRITE NEGATIVE 03/15/2018 2110   LEUKOCYTESUR NEGATIVE 03/15/2018 2110   Sepsis Labs: @LABRCNTIP (procalcitonin:4,lacticidven:4) ) Recent Results (from the past 240 hour(s))  MRSA PCR Screening     Status: Abnormal   Collection Time: 10/24/18 10:30  AM  Result Value Ref Range Status   MRSA by PCR POSITIVE (A) NEGATIVE Final    Comment:        The GeneXpert MRSA Assay (FDA approved for NASAL specimens only), is one component of a comprehensive MRSA colonization surveillance program. It is not intended to diagnose MRSA infection nor to guide or monitor treatment for MRSA infections. RESULT CALLED TO, READ BACK BY AND VERIFIED WITH: J HEARN,RN @0351  10/25/18 [facility] Performed at [facility], [address], [place], [place] [address]      Scheduled Meds: . atorvastatin  20 mg Oral QHS  . bisoprolol  10 mg Oral q morning - 10a  . budesonide (PULMICORT) nebulizer solution  0.5 mg Nebulization BID  . calcitRIOL  0.25 mcg Oral q morning - 10a  . clonazePAM  0.5 mg Oral QHS  . dextromethorphan-guaiFENesin  1 tablet Oral BID  . diltiazem  240 mg Oral q morning - 10a   . epoetin (EPOGEN/PROCRIT) injection  10,000 Units Intravenous Q M,W,F-HD  . feeding supplement (NEPRO CARB STEADY)  237 mL Oral BID BM  . heparin  3,800 Units Intracatheter Q M,W,F-HD  . ipratropium  0.5 mg Nebulization Q6H WA  . levalbuterol  0.63 mg Nebulization Q6H WA  . loratadine  10 mg Oral QHS  . multivitamin  1 tablet Oral QHS  . nicotine  14 mg Transdermal Daily  . pantoprazole  40 mg Oral BID  . polyethylene glycol  17 g Oral Daily  . terazosin  2 mg Oral QHS   Continuous Infusions: . sodium chloride    . sodium chloride    . sodium chloride      Procedures/Studies: Dg Chest 2 View  Result Date: 10/23/2018 CLINICAL DATA:  Chest pain and weakness EXAM: CHEST - 2 VIEW COMPARISON:  03/19/2018 FINDINGS: Bilateral diffuse mild interstitial and patchy alveolar airspace opacities. Two areas of nodular airspace disease in the left upper lobe and left lower lobe. Small bilateral pleural effusions. No pneumothorax. Stable cardiomediastinal silhouette. No aggressive osseous lesion. IMPRESSION: 1. Bilateral diffuse mild interstitial and alveolar airspace disease with 2 nodular areas of airspace disease in left upper lobe and left lower lobe. Small bilateral pleural effusions. Differential considerations include mild pulmonary edema versus multilobar pneumonia. Electronically Signed   By: Kathreen Devoid   On: 10/23/2018 15:38   Ir Cyndy Freeze Guide Cv Line Right  Result Date: 10/29/2018 INDICATION: 77 year old with end-stage renal disease and maturing AV arm access. Patient needs a tunneled catheter for hemodialysis. EXAM: FLUOROSCOPIC AND ULTRASOUND GUIDED PLACEMENT OF A TUNNELED DIALYSIS CATHETER Physician: Stephan Minister. Anselm Pancoast, MD MEDICATIONS: Ancef 2 g; The antibiotic was administered within an appropriate time interval prior to skin puncture. ANESTHESIA/SEDATION: Versed 2 mg IV; Fentanyl 100 mcg IV; Moderate Sedation Time:  22 minutes The patient was continuously monitored during the procedure by  the interventional radiology nurse under my direct supervision. FLUOROSCOPY TIME:  Fluoroscopy Time: 30 seconds, 1 mGy COMPLICATIONS: None immediate. PROCEDURE: The procedure was explained to the patient. The risks and benefits of the procedure were discussed and the patient's questions were addressed. Informed consent was obtained from the patient. The patient was placed supine on the interventional table. Ultrasound confirmed a patent right internal jugular vein. Ultrasound images were obtained for documentation. The right side of the neck and chest was prepped and draped in a sterile fashion. The right neck was anesthetized with 1% lidocaine. Maximal barrier sterile technique was utilized including caps, mask, sterile gowns, sterile gloves, sterile  drape, hand hygiene and skin antiseptic. A small incision was made with #11 blade scalpel. A 21 gauge needle directed into the right internal jugular vein with ultrasound guidance. A micropuncture dilator set was placed. A 23 cm tip to cuff Palindrome catheter was selected. The skin below the right clavicle was anesthetized and a small incision was made with an #11 blade scalpel. A subcutaneous tunnel was formed to the vein dermatotomy site. The catheter was brought through the tunnel. The vein dermatotomy site was dilated to accommodate a peel-away sheath. The catheter was placed through the peel-away sheath and directed into the central venous structures. The tip of the catheter was placed at the SVC/right atrium junction with fluoroscopy. Fluoroscopic images were obtained for documentation. Both lumens were found to aspirate and flush well. The proper amount of heparin was flushed in both lumens. The vein dermatotomy site was closed using a single layer of absorbable suture and Dermabond. The catheter was secured to the skin using Prolene suture. IMPRESSION: Successful placement of a right jugular tunneled dialysis catheter using ultrasound and fluoroscopic  guidance. Electronically Signed   By: Markus Daft M.D.   On: 10/29/2018 13:42   Ir US Guide Vasc Access Right  Result Date: 10/29/2018 INDICATION: 77 year old with end-stage renal disease and maturing AV arm access. Patient needs a tunneled catheter for hemodialysis. EXAM: FLUOROSCOPIC AND ULTRASOUND GUIDED PLACEMENT OF A TUNNELED DIALYSIS CATHETER Physician: Stephan Minister. Anselm Pancoast, MD MEDICATIONS: Ancef 2 g; The antibiotic was administered within an appropriate time interval prior to skin puncture. ANESTHESIA/SEDATION: Versed 2 mg IV; Fentanyl 100 mcg IV; Moderate Sedation Time:  22 minutes The patient was continuously monitored during the procedure by the interventional radiology nurse under my direct supervision. FLUOROSCOPY TIME:  Fluoroscopy Time: 30 seconds, 1 mGy COMPLICATIONS: None immediate. PROCEDURE: The procedure was explained to the patient. The risks and benefits of the procedure were discussed and the patient's questions were addressed. Informed consent was obtained from the patient. The patient was placed supine on the interventional table. Ultrasound confirmed a patent right internal jugular vein. Ultrasound images were obtained for documentation. The right side of the neck and chest was prepped and draped in a sterile fashion. The right neck was anesthetized with 1% lidocaine. Maximal barrier sterile technique was utilized including caps, mask, sterile gowns, sterile gloves, sterile drape, hand hygiene and skin antiseptic. A small incision was made with #11 blade scalpel. A 21 gauge needle directed into the right internal jugular vein with ultrasound guidance. A micropuncture dilator set was placed. A 23 cm tip to cuff Palindrome catheter was selected. The skin below the right clavicle was anesthetized and a small incision was made with an #11 blade scalpel. A subcutaneous tunnel was formed to the vein dermatotomy site. The catheter was brought through the tunnel. The vein dermatotomy site was dilated to  accommodate a peel-away sheath. The catheter was placed through the peel-away sheath and directed into the central venous structures. The tip of the catheter was placed at the SVC/right atrium junction with fluoroscopy. Fluoroscopic images were obtained for documentation. Both lumens were found to aspirate and flush well. The proper amount of heparin was flushed in both lumens. The vein dermatotomy site was closed using a single layer of absorbable suture and Dermabond. The catheter was secured to the skin using Prolene suture. IMPRESSION: Successful placement of a right jugular tunneled dialysis catheter using ultrasound and fluoroscopic guidance. Electronically Signed   By: Markus Daft M.D.   On: 10/29/2018 13:42  Orson Eva, DO  [facility] Pager [phone]  If 7PM-7AM, please contact night-coverage [email or web address] Password [email or web address] 10/30/2018, 2:10 PM   LOS: 7 days

## 2018-10-30 NOTE — Progress Notes (Signed)
Patients heart rate sustaining 120's in Afib. Mid Level notified. New orders placed.

## 2018-10-30 NOTE — Progress Notes (Signed)
Patient resting in bed. No nurse on floor is certified to remove femoral central line. Dialysis nurse is on the way to perform complete dialysis today. Spoke with Dr. Arnoldo Morale today and he stated that he will remove the femoral central catheter tomorrow morning.

## 2018-10-31 DIAGNOSIS — I132 Hypertensive heart and chronic kidney disease with heart failure and with stage 5 chronic kidney disease, or end stage renal disease: Secondary | ICD-10-CM | POA: Diagnosis not present

## 2018-10-31 DIAGNOSIS — D649 Anemia, unspecified: Secondary | ICD-10-CM | POA: Diagnosis not present

## 2018-10-31 DIAGNOSIS — N184 Chronic kidney disease, stage 4 (severe): Secondary | ICD-10-CM | POA: Diagnosis not present

## 2018-10-31 DIAGNOSIS — I482 Chronic atrial fibrillation, unspecified: Secondary | ICD-10-CM | POA: Diagnosis not present

## 2018-10-31 DIAGNOSIS — D631 Anemia in chronic kidney disease: Secondary | ICD-10-CM | POA: Diagnosis not present

## 2018-10-31 DIAGNOSIS — E782 Mixed hyperlipidemia: Secondary | ICD-10-CM | POA: Diagnosis not present

## 2018-10-31 DIAGNOSIS — Z7401 Bed confinement status: Secondary | ICD-10-CM | POA: Diagnosis not present

## 2018-10-31 DIAGNOSIS — J9601 Acute respiratory failure with hypoxia: Secondary | ICD-10-CM | POA: Diagnosis not present

## 2018-10-31 DIAGNOSIS — E119 Type 2 diabetes mellitus without complications: Secondary | ICD-10-CM | POA: Diagnosis not present

## 2018-10-31 DIAGNOSIS — Z79899 Other long term (current) drug therapy: Secondary | ICD-10-CM | POA: Diagnosis not present

## 2018-10-31 DIAGNOSIS — N179 Acute kidney failure, unspecified: Secondary | ICD-10-CM | POA: Diagnosis not present

## 2018-10-31 DIAGNOSIS — N2581 Secondary hyperparathyroidism of renal origin: Secondary | ICD-10-CM | POA: Diagnosis not present

## 2018-10-31 DIAGNOSIS — N185 Chronic kidney disease, stage 5: Secondary | ICD-10-CM | POA: Diagnosis not present

## 2018-10-31 DIAGNOSIS — E440 Moderate protein-calorie malnutrition: Secondary | ICD-10-CM | POA: Diagnosis not present

## 2018-10-31 DIAGNOSIS — R195 Other fecal abnormalities: Secondary | ICD-10-CM

## 2018-10-31 DIAGNOSIS — N186 End stage renal disease: Secondary | ICD-10-CM | POA: Diagnosis not present

## 2018-10-31 DIAGNOSIS — R5381 Other malaise: Secondary | ICD-10-CM | POA: Diagnosis not present

## 2018-10-31 DIAGNOSIS — E039 Hypothyroidism, unspecified: Secondary | ICD-10-CM | POA: Diagnosis not present

## 2018-10-31 DIAGNOSIS — I129 Hypertensive chronic kidney disease with stage 1 through stage 4 chronic kidney disease, or unspecified chronic kidney disease: Secondary | ICD-10-CM | POA: Diagnosis not present

## 2018-10-31 DIAGNOSIS — J441 Chronic obstructive pulmonary disease with (acute) exacerbation: Secondary | ICD-10-CM | POA: Diagnosis not present

## 2018-10-31 DIAGNOSIS — E559 Vitamin D deficiency, unspecified: Secondary | ICD-10-CM | POA: Diagnosis not present

## 2018-10-31 DIAGNOSIS — Z1159 Encounter for screening for other viral diseases: Secondary | ICD-10-CM | POA: Diagnosis not present

## 2018-10-31 DIAGNOSIS — N178 Other acute kidney failure: Secondary | ICD-10-CM

## 2018-10-31 DIAGNOSIS — Z720 Tobacco use: Secondary | ICD-10-CM | POA: Diagnosis not present

## 2018-10-31 DIAGNOSIS — I5033 Acute on chronic diastolic (congestive) heart failure: Secondary | ICD-10-CM | POA: Diagnosis not present

## 2018-10-31 DIAGNOSIS — D518 Other vitamin B12 deficiency anemias: Secondary | ICD-10-CM | POA: Diagnosis not present

## 2018-10-31 DIAGNOSIS — E7800 Pure hypercholesterolemia, unspecified: Secondary | ICD-10-CM | POA: Diagnosis not present

## 2018-10-31 DIAGNOSIS — N189 Chronic kidney disease, unspecified: Secondary | ICD-10-CM | POA: Diagnosis not present

## 2018-10-31 DIAGNOSIS — Z992 Dependence on renal dialysis: Secondary | ICD-10-CM | POA: Diagnosis not present

## 2018-10-31 MED ORDER — CLONAZEPAM 0.5 MG PO TABS: 0.5000 mg | ORAL_TABLET | Freq: Every day | ORAL | 0 refills | Status: DC

## 2018-10-31 MED ORDER — NEPRO/CARBSTEADY PO LIQD: 237.0000 mL | Freq: Two times a day (BID) | ORAL | 0 refills | Status: AC

## 2018-10-31 MED ORDER — RENA-VITE PO TABS: 1.0000 | ORAL_TABLET | Freq: Every day | ORAL | 0 refills | Status: DC

## 2018-10-31 NOTE — Progress Notes (Signed)
Right femoral cath removed this morning by Dr. Constance Haw and pressure held for 10 minutes by tech per Dr. Constance Haw  Dressing continues dry and intact

## 2018-10-31 NOTE — Progress Notes (Signed)
IV removed and discharge instructions in packet.  Report called to Lerry Paterson, RN at [facility].  Son, [name] notified by phone.

## 2018-10-31 NOTE — Progress Notes (Signed)
[facility] Progress Note   Assessment/ Plan:   1.  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  In hospital, on a Monday/Wednesday/Friday schedule and I do not have any details about his outpatient dialysis schedule (this would be important to planning dialysis here in the hospital) regardless, he is clinically stable for discharge from a renal standpoint to continue dialysis either tomorrow or on Saturday based on labs/physical exam.  I will order for hemodialysis here in the hospital for tomorrow should he still be an inpatient. 2.  Acute hypoxic respiratory failure: Likely from a combination of COPD exacerbation and exacerbation of diastolic heart failure in the setting of worsening renal function.  Clinically better with bronchodilators/dialysis. 3.  Anemia: Secondary to anemia of chronic kidney disease and with improvement noted status post PRBC.  Continue intravenous iron/Epogen. 4.  Atrial fibrillation: Currently rate controlled with acceptable blood pressures.  Not yet restarted on Eliquis. 5.  Secondary hyperparathyroidism: Phosphorus and calcium levels acceptable.  On calcitriol for PTH suppression. 6.  Hypertension: Blood pressure under fair control, monitor with ultrafiltration at hemodialysis. 7.  Nutrition: Continue current diet with fluid restriction and renal vitamin.  Subjective:   No complaints overnight, anticipating to ambulate around hallways today after removal of femoral catheter.   Objective:   BP 128/82 (BP Location: Right Arm)   Pulse 82   Temp 97.9 F (36.6 C)   Resp 16   Ht 5\' 9"  (1.753 m)   Wt 65.8 kg   SpO2 95%   BMI 21.42 kg/m   Intake/Output Summary (Last 24 hours) at 10/31/2018 0802 Last data filed at 10/31/2018 0012 Gross per 24 hour  Intake 840 ml  Output 1500  ml  Net -660 ml   Weight change: 0 kg  Physical Exam: Gen: Comfortably eating breakfast up in bed CVS: Pulse irregularly irregular, currently normal rate, ejection systolic murmur over apex Resp: Decreased breath sounds over bases-poor inspiratory effort, no rales/rhonchi.  Right IJ TDC with bruising Abd: Soft, flat, nontender.  Right femoral temporary dialysis catheter. Ext: Trace ankle edema.  Status post first stage left upper arm BVT-palpable thrill  Imaging: Ir Fluoro Guide Cv Line Right  Result Date: 10/29/2018 INDICATION: 77 year old with end-stage renal disease and maturing AV arm access. Patient needs a tunneled catheter for hemodialysis. EXAM: FLUOROSCOPIC AND ULTRASOUND GUIDED PLACEMENT OF A TUNNELED DIALYSIS CATHETER Physician: Stephan Minister. Anselm Pancoast, MD MEDICATIONS: Ancef 2 g; The antibiotic was administered within an appropriate time interval prior to skin puncture. ANESTHESIA/SEDATION: Versed 2 mg IV; Fentanyl 100 mcg IV; Moderate Sedation Time:  22 minutes The patient was continuously monitored during the procedure by the interventional radiology nurse under my direct supervision. FLUOROSCOPY TIME:  Fluoroscopy Time: 30 seconds, 1 mGy COMPLICATIONS: None immediate. PROCEDURE: The procedure was explained to the patient. The risks and benefits of the procedure were discussed and the patient's questions were addressed. Informed consent was obtained from the patient. The patient was placed supine on the interventional table. Ultrasound confirmed a patent right internal jugular vein. Ultrasound images were obtained for documentation. The right side of the neck and chest was prepped and draped in a sterile fashion. The right neck was anesthetized with 1% lidocaine. Maximal barrier sterile technique was utilized including caps, mask, sterile gowns, sterile gloves, sterile drape, hand hygiene and skin antiseptic. A small incision was made  with #11 blade scalpel. A 21 gauge needle directed into the  right internal jugular vein with ultrasound guidance. A micropuncture dilator set was placed. A 23 cm tip to cuff Palindrome catheter was selected. The skin below the right clavicle was anesthetized and a small incision was made with an #11 blade scalpel. A subcutaneous tunnel was formed to the vein dermatotomy site. The catheter was brought through the tunnel. The vein dermatotomy site was dilated to accommodate a peel-away sheath. The catheter was placed through the peel-away sheath and directed into the central venous structures. The tip of the catheter was placed at the SVC/right atrium junction with fluoroscopy. Fluoroscopic images were obtained for documentation. Both lumens were found to aspirate and flush well. The proper amount of heparin was flushed in both lumens. The vein dermatotomy site was closed using a single layer of absorbable suture and Dermabond. The catheter was secured to the skin using Prolene suture. IMPRESSION: Successful placement of a right jugular tunneled dialysis catheter using ultrasound and fluoroscopic guidance. Electronically Signed   By: Markus Daft M.D.   On: 10/29/2018 13:42   Ir US Guide Vasc Access Right  Result Date: 10/29/2018 INDICATION: 77 year old with end-stage renal disease and maturing AV arm access. Patient needs a tunneled catheter for hemodialysis. EXAM: FLUOROSCOPIC AND ULTRASOUND GUIDED PLACEMENT OF A TUNNELED DIALYSIS CATHETER Physician: Stephan Minister. Anselm Pancoast, MD MEDICATIONS: Ancef 2 g; The antibiotic was administered within an appropriate time interval prior to skin puncture. ANESTHESIA/SEDATION: Versed 2 mg IV; Fentanyl 100 mcg IV; Moderate Sedation Time:  22 minutes The patient was continuously monitored during the procedure by the interventional radiology nurse under my direct supervision. FLUOROSCOPY TIME:  Fluoroscopy Time: 30 seconds, 1 mGy COMPLICATIONS: None immediate. PROCEDURE: The procedure was explained to the patient. The risks and benefits of the  procedure were discussed and the patient's questions were addressed. Informed consent was obtained from the patient. The patient was placed supine on the interventional table. Ultrasound confirmed a patent right internal jugular vein. Ultrasound images were obtained for documentation. The right side of the neck and chest was prepped and draped in a sterile fashion. The right neck was anesthetized with 1% lidocaine. Maximal barrier sterile technique was utilized including caps, mask, sterile gowns, sterile gloves, sterile drape, hand hygiene and skin antiseptic. A small incision was made with #11 blade scalpel. A 21 gauge needle directed into the right internal jugular vein with ultrasound guidance. A micropuncture dilator set was placed. A 23 cm tip to cuff Palindrome catheter was selected. The skin below the right clavicle was anesthetized and a small incision was made with an #11 blade scalpel. A subcutaneous tunnel was formed to the vein dermatotomy site. The catheter was brought through the tunnel. The vein dermatotomy site was dilated to accommodate a peel-away sheath. The catheter was placed through the peel-away sheath and directed into the central venous structures. The tip of the catheter was placed at the SVC/right atrium junction with fluoroscopy. Fluoroscopic images were obtained for documentation. Both lumens were found to aspirate and flush well. The proper amount of heparin was flushed in both lumens. The vein dermatotomy site was closed using a single layer of absorbable suture and Dermabond. The catheter was secured to the skin using Prolene suture. IMPRESSION: Successful placement of a right jugular tunneled dialysis catheter using ultrasound and fluoroscopic guidance. Electronically Signed   By: Markus Daft M.D.   On: 10/29/2018 13:42    Labs: BMET Recent Labs  Lab 10/25/18 [phone]  10/26/18 0618 10/27/18 0614 10/29/18 0436 10/30/18 1011  NA 140 138 134* 138 134*  K 4.7 4.6 4.5 4.8 4.9   CL 107 103 100 104 102  CO2 22 23 24 26 24   GLUCOSE 178* 159* 123* 103* 176*  BUN 90* 98* 79* 66* 74*  CREATININE 5.12* 5.11* 3.82* 3.47* 4.09*  CALCIUM 8.4* 8.6* 8.5* 8.1* 8.1*  PHOS 4.6 5.0*  --   --  3.5   CBC Recent Labs  Lab 10/25/18 0409 10/26/18 0618 10/28/18 1345 10/30/18 1011  WBC 6.2 12.7* 6.9 6.9  HGB 7.8* 9.5* 8.7* 8.5*  HCT 25.2* 30.2* 27.7* 27.6*  MCV 100.8* 100.7* 100.7* 104.5*  PLT 208 227 207 187    Medications:    . atorvastatin  20 mg Oral QHS  . bisoprolol  10 mg Oral q morning - 10a  . budesonide (PULMICORT) nebulizer solution  0.5 mg Nebulization BID  . calcitRIOL  0.25 mcg Oral q morning - 10a  . clonazePAM  0.5 mg Oral QHS  . dextromethorphan-guaiFENesin  1 tablet Oral BID  . diltiazem  240 mg Oral q morning - 10a  . epoetin (EPOGEN/PROCRIT) injection  10,000 Units Intravenous Q M,W,F-HD  . feeding supplement (NEPRO CARB STEADY)  237 mL Oral BID BM  . heparin  3,800 Units Intracatheter Q M,W,F-HD  . ipratropium  0.5 mg Nebulization Q6H WA  . levalbuterol  0.63 mg Nebulization Q6H WA  . loratadine  10 mg Oral QHS  . multivitamin  1 tablet Oral QHS  . nicotine  14 mg Transdermal Daily  . pantoprazole  40 mg Oral BID  . polyethylene glycol  17 g Oral Daily  . terazosin  2 mg Oral QHS   Elmarie Shiley, MD 10/31/2018, 8:02 AM

## 2018-10-31 NOTE — Progress Notes (Signed)
[facility]  R femoral dialysis catheter removed. CNA held direct pressure for 10 minutes. Clean dressing applied.  Curlene Labrum, MD [facility] [address] [place], [place] [address] [phone] (office)

## 2018-10-31 NOTE — Care Management Important Message (Signed)
Important Message  Patient Details  Name: Don [name] MRN: [id number] Date of Birth: 03-07-1941   Medicare Important Message Given:  Yes    Dilana Mcphie, Chauncey Reading, RN 10/31/2018, 11:40 AM

## 2018-10-31 NOTE — Discharge Summary (Signed)
Physician Discharge Summary  Don [name] XNA:[id number] DOB: September 15, 1941 DOA: 10/23/2018  PCP: Dettinger, Fransisca Kaufmann, MD  Admit date: 10/23/2018 Discharge date: 10/31/2018  Admitted From: Home Disposition:  SNF  Recommendations for Outpatient Follow-up:  1. Follow up with PCP in 1-2 weeks 2. Please obtain BMP/CBC in one week   Discharge Condition: Stable CODE STATUS: FULL Diet recommendation: Heart Healthy    Brief/Interim Summary: 77 year old male with a history of COPD, atrial fibrillation on apixaban, tobacco abuse, hypertension, prostate cancer, CKD stage IV presenting with shortness of breath, lower extremity edema, orthopnea.  Patient is also complaining of generalized weakness and fatigue.  Upon presentation, the patient was noted to be fluid overloaded and found to have acute on chronic diastolic CHF with acute on chronic renal failure.  The patient was initially started on diuretics.  Nephrology was consulted.  [name] renal function continued to decline, and the patient was initiated on dialysis.  Tunneled dialysis catheter was placed on 10/30/2018.  With assistance of social work, an outpatient dialysis chair was obtained.  Physical therapy consult recommended skilled nursing facility.  Discharge Diagnoses:   Acute respiratory failure with hypoxia ([place]):  -Due to fluid overload from worsening renal function, COPD exacerbation and acute on chronic diastolic heart failure. -Patient requiring oxygen supplementation initially-->RA -Continue volume management with hemodialysis at this moment. -outstanding improvement to the use of steroids and nebulizer treatment.  -personally reviewed CXR--no consolidation  Acute on chronic diastolic CHF -Patient likely had a component of decompensated CHF with decrease in EF from 65% to 55% -11/11/2018 echo EF 55%, no WMA, mild left ear, trivial TR -Volume management per HD  COPD exacerbation -Continue Pulmicort and  bronchodilators -Patient finished a course of steroids -No wheezing presently  anemia of chronic kidney disease -presented with Hgb 7.8 and +FOBT -Baseline hemoglobin range in the 8.2-8.6 -Patient received 1 unit of PRBC during this admission; Hemoglobin has remained stable since then.  -No signs of acute bleeding appreciated -Epogen and IV iron as per nephrology recommendations. -d/c with po iron -03/22/18 EGD--erosive gastropathy -consult GI--> No need for endoscopic evaluation at this time given no overt GI bleeding. Would avoid NSAIDs. Continue daily PPI. 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 -discussed with Dr. Ulice Dash Patel-->ok for d/c -out pt HD on TTS  hyperlipidemia  -continue statins.  elevation of troponin -due to demand ischemia with acute diastolic heart failure on top of chronic renal failure -trend is flat -Currently not having any chest pain -10/25/2018 echo EF 55%, no WMA, mild left ear, trivial TR  tobacco abuse -Extensive cessation counseling has been provided -Continue nicotine patch.   physical deconditioning -need skilled nursing facility at discharge with anticipated transition to assisted living facility at some point.  Discharge Instructions   Allergies as of 10/31/2018      Reactions   Wellbutrin [bupropion] Anxiety        Medication List    STOP taking these medications  oxyCODONE 5 MG immediate release tablet Commonly known as:  ROXICODONE     TAKE these medications   acetaminophen 500 MG tablet Commonly known as:  TYLENOL Take 500 mg by mouth every 6 (six) hours as needed for moderate pain.   acitretin 10 MG capsule Commonly known as:  SORIATANE Take 10 mg by mouth daily.   apixaban 5 MG Tabs tablet Commonly known as:  ELIQUIS Take 1 tablet (5 mg total) by mouth 2 (two) times daily. (Needs to be seen) What changed:  additional instructions   atorvastatin 20 MG tablet Commonly known as:  LIPITOR Take 1 tablet (20 mg total) by mouth daily. (Needs to be seen) What changed:    when to take this  additional instructions   bisoprolol 10 MG tablet Commonly known as:  ZEBETA TAKE (1) TABLET TWICE A DAY. What changed:  See the new instructions.   budesonide-formoterol 160-4.5 MCG/ACT inhaler Commonly known as:  SYMBICORT Inhale 2 puffs into the lungs 2 (two) times daily.   calcitRIOL 0.25 MCG capsule Commonly known as:  ROCALTROL TAKE (1) CAPSULE DAILY What changed:    how much to take  how to take this  when to take this  additional instructions   clonazePAM 0.5 MG tablet Commonly known as:  KLONOPIN Take 1 tablet (0.5 mg total) by mouth at bedtime. What changed:    medication strength  how much to take  how to take this  when to take this  additional instructions   diltiazem 240 MG 24 hr capsule Commonly known as:  CARDIZEM CD TAKE (1) CAPSULE DAILY What changed:    how much to take  how to take this  when to take this  additional instructions   feeding supplement (NEPRO CARB STEADY) Liqd Take 237 mLs by mouth 2 (two) times daily between meals.   ferrous sulfate 325 (65 FE) MG tablet Take 325 mg by mouth every Monday, Wednesday, and Friday.   hydroxypropyl methylcellulose / hypromellose 2.5 % ophthalmic solution Commonly known as:  ISOPTO TEARS /  GONIOVISC Place 1 drop into both eyes daily as needed for dry eyes.   iron polysaccharides 150 MG capsule Commonly known as:  FERREX 150 Take 1 capsule (150 mg total) by mouth daily. (Needs to be seen) What changed:  when to take this   loratadine 10 MG tablet Commonly known as:  CLARITIN TAKE 1 TABLET DAILY What changed:  when to take this   Melatonin 3 MG Tabs Take 3 mg by mouth at bedtime.   multivitamin Tabs tablet Take 1 tablet by mouth at bedtime.   multivitamin with minerals Tabs tablet Take 1 tablet by mouth at bedtime.   omega-3 acid ethyl esters 1 g capsule Commonly known as:  LOVAZA Take 2 g by mouth 2 (two) times daily.   pantoprazole 40 MG tablet Commonly known as:  PROTONIX TAKE (1) TABLET TWICE A DAY. What changed:  See the new instructions.   PRESERVISION AREDS 2 PO Take 1 tablet by mouth 2 (two) times daily.   PROAIR HFA 108 (90 Base) MCG/ACT inhaler Generic drug:  albuterol Inhale 2 puffs into the lungs 2 (two) times daily.   terazosin 2 MG capsule Commonly known as:  HYTRIN TAKE 1 CAPSULE AT BEDTIME   tiotropium 18 MCG inhalation capsule Commonly known as:  SPIRIVA Place 18 mcg into inhaler and inhale daily as needed (for shortness of breath).      Contact information for after-discharge care  Destination    [facility] .   Service:  Skilled Nursing Contact information: [place] [phone]             Allergies  Allergen Reactions  . Wellbutrin [Bupropion] Anxiety    Consultations:  Renal  GI   Procedures/Studies: Dg Chest 2 View  Result Date: 10/23/2018 CLINICAL DATA:  Chest pain and weakness EXAM: CHEST - 2 VIEW COMPARISON:  03/19/2018 FINDINGS: Bilateral diffuse mild interstitial and patchy alveolar airspace opacities. Two areas of nodular airspace disease in the left upper lobe and left lower lobe. Small bilateral pleural effusions. No pneumothorax. Stable  cardiomediastinal silhouette. No aggressive osseous lesion. IMPRESSION: 1. Bilateral diffuse mild interstitial and alveolar airspace disease with 2 nodular areas of airspace disease in left upper lobe and left lower lobe. Small bilateral pleural effusions. Differential considerations include mild pulmonary edema versus multilobar pneumonia. Electronically Signed   By: Kathreen Devoid   On: 10/23/2018 15:38   Ir Cyndy Freeze Guide Cv Line Right  Result Date: 10/29/2018 INDICATION: 77 year old with end-stage renal disease and maturing AV arm access. Patient needs a tunneled catheter for hemodialysis. EXAM: FLUOROSCOPIC AND ULTRASOUND GUIDED PLACEMENT OF A TUNNELED DIALYSIS CATHETER Physician: Stephan Minister. Anselm Pancoast, MD MEDICATIONS: Ancef 2 g; The antibiotic was administered within an appropriate time interval prior to skin puncture. ANESTHESIA/SEDATION: Versed 2 mg IV; Fentanyl 100 mcg IV; Moderate Sedation Time:  22 minutes The patient was continuously monitored during the procedure by the interventional radiology nurse under my direct supervision. FLUOROSCOPY TIME:  Fluoroscopy Time: 30 seconds, 1 mGy COMPLICATIONS: None immediate. PROCEDURE: The procedure was explained to the patient. The risks and benefits of the procedure were discussed and the patient's questions were addressed. Informed consent was obtained from the patient. The patient was placed supine on the interventional table. Ultrasound confirmed a patent right internal jugular vein. Ultrasound images were obtained for documentation. The right side of the neck and chest was prepped and draped in a sterile fashion. The right neck was anesthetized with 1% lidocaine. Maximal barrier sterile technique was utilized including caps, mask, sterile gowns, sterile gloves, sterile drape, hand hygiene and skin antiseptic. A small incision was made with #11 blade scalpel. A 21 gauge needle directed into the right internal jugular vein with ultrasound guidance. A micropuncture  dilator set was placed. A 23 cm tip to cuff Palindrome catheter was selected. The skin below the right clavicle was anesthetized and a small incision was made with an #11 blade scalpel. A subcutaneous tunnel was formed to the vein dermatotomy site. The catheter was brought through the tunnel. The vein dermatotomy site was dilated to accommodate a peel-away sheath. The catheter was placed through the peel-away sheath and directed into the central venous structures. The tip of the catheter was placed at the SVC/right atrium junction with fluoroscopy. Fluoroscopic images were obtained for documentation. Both lumens were found to aspirate and flush well. The proper amount of heparin was flushed in both lumens. The vein dermatotomy site was closed using a single layer of absorbable suture and Dermabond. The catheter was secured to the skin using Prolene suture. IMPRESSION: Successful placement of a right jugular tunneled dialysis catheter using ultrasound and fluoroscopic guidance. Electronically Signed   By: Markus Daft M.D.   On: 10/29/2018 13:42   Ir US Guide Vasc Access Right  Result Date: 10/29/2018 INDICATION: 77 year old with end-stage renal disease and maturing AV arm access. Patient needs a tunneled catheter for hemodialysis. EXAM:  FLUOROSCOPIC AND ULTRASOUND GUIDED PLACEMENT OF A TUNNELED DIALYSIS CATHETER Physician: Stephan Minister. Anselm Pancoast, MD MEDICATIONS: Ancef 2 g; The antibiotic was administered within an appropriate time interval prior to skin puncture. ANESTHESIA/SEDATION: Versed 2 mg IV; Fentanyl 100 mcg IV; Moderate Sedation Time:  22 minutes The patient was continuously monitored during the procedure by the interventional radiology nurse under my direct supervision. FLUOROSCOPY TIME:  Fluoroscopy Time: 30 seconds, 1 mGy COMPLICATIONS: None immediate. PROCEDURE: The procedure was explained to the patient. The risks and benefits of the procedure were discussed and the patient's questions were addressed.  Informed consent was obtained from the patient. The patient was placed supine on the interventional table. Ultrasound confirmed a patent right internal jugular vein. Ultrasound images were obtained for documentation. The right side of the neck and chest was prepped and draped in a sterile fashion. The right neck was anesthetized with 1% lidocaine. Maximal barrier sterile technique was utilized including caps, mask, sterile gowns, sterile gloves, sterile drape, hand hygiene and skin antiseptic. A small incision was made with #11 blade scalpel. A 21 gauge needle directed into the right internal jugular vein with ultrasound guidance. A micropuncture dilator set was placed. A 23 cm tip to cuff Palindrome catheter was selected. The skin below the right clavicle was anesthetized and a small incision was made with an #11 blade scalpel. A subcutaneous tunnel was formed to the vein dermatotomy site. The catheter was brought through the tunnel. The vein dermatotomy site was dilated to accommodate a peel-away sheath. The catheter was placed through the peel-away sheath and directed into the central venous structures. The tip of the catheter was placed at the SVC/right atrium junction with fluoroscopy. Fluoroscopic images were obtained for documentation. Both lumens were found to aspirate and flush well. The proper amount of heparin was flushed in both lumens. The vein dermatotomy site was closed using a single layer of absorbable suture and Dermabond. The catheter was secured to the skin using Prolene suture. IMPRESSION: Successful placement of a right jugular tunneled dialysis catheter using ultrasound and fluoroscopic guidance. Electronically Signed   By: Markus Daft M.D.   On: 10/29/2018 13:42         Discharge Exam: Vitals:   10/31/18 0607 10/31/18 1113  BP: 128/82   Pulse: 82   Resp: 16   Temp: 97.9 F (36.6 C)   SpO2: 95% 94%   Vitals:   10/30/18 2145 10/31/18 0500 10/31/18 0607 10/31/18 1113  BP:    128/82   Pulse:   82   Resp:   16   Temp:   97.9 F (36.6 C)   TempSrc:      SpO2: 94%  95% 94%  Weight:  65.8 kg    Height:        General: Pt is alert, awake, not in acute distress Cardiovascular: RRR, S1/S2 +, no rubs, no gallops Respiratory: bibasilar crackles, no wheeze Abdominal: Soft, NT, ND, bowel sounds + Extremities: no edema, no cyanosis   The results of significant diagnostics from this hospitalization (including imaging, microbiology, ancillary and laboratory) are listed below for reference.    Significant Diagnostic Studies: Dg Chest 2 View  Result Date: 10/23/2018 CLINICAL DATA:  Chest pain and weakness EXAM: CHEST - 2 VIEW COMPARISON:  03/19/2018 FINDINGS: Bilateral diffuse mild interstitial and patchy alveolar airspace opacities. Two areas of nodular airspace disease in the left upper lobe and left lower lobe. Small bilateral pleural effusions. No pneumothorax. Stable cardiomediastinal silhouette. No aggressive osseous lesion. IMPRESSION:  1. Bilateral diffuse mild interstitial and alveolar airspace disease with 2 nodular areas of airspace disease in left upper lobe and left lower lobe. Small bilateral pleural effusions. Differential considerations include mild pulmonary edema versus multilobar pneumonia. Electronically Signed   By: Kathreen Devoid   On: 10/23/2018 15:38   Ir Cyndy Freeze Guide Cv Line Right  Result Date: 10/29/2018 INDICATION: 77 year old with end-stage renal disease and maturing AV arm access. Patient needs a tunneled catheter for hemodialysis. EXAM: FLUOROSCOPIC AND ULTRASOUND GUIDED PLACEMENT OF A TUNNELED DIALYSIS CATHETER Physician: Stephan Minister. Anselm Pancoast, MD MEDICATIONS: Ancef 2 g; The antibiotic was administered within an appropriate time interval prior to skin puncture. ANESTHESIA/SEDATION: Versed 2 mg IV; Fentanyl 100 mcg IV; Moderate Sedation Time:  22 minutes The patient was continuously monitored during the procedure by the interventional radiology nurse under  my direct supervision. FLUOROSCOPY TIME:  Fluoroscopy Time: 30 seconds, 1 mGy COMPLICATIONS: None immediate. PROCEDURE: The procedure was explained to the patient. The risks and benefits of the procedure were discussed and the patient's questions were addressed. Informed consent was obtained from the patient. The patient was placed supine on the interventional table. Ultrasound confirmed a patent right internal jugular vein. Ultrasound images were obtained for documentation. The right side of the neck and chest was prepped and draped in a sterile fashion. The right neck was anesthetized with 1% lidocaine. Maximal barrier sterile technique was utilized including caps, mask, sterile gowns, sterile gloves, sterile drape, hand hygiene and skin antiseptic. A small incision was made with #11 blade scalpel. A 21 gauge needle directed into the right internal jugular vein with ultrasound guidance. A micropuncture dilator set was placed. A 23 cm tip to cuff Palindrome catheter was selected. The skin below the right clavicle was anesthetized and a small incision was made with an #11 blade scalpel. A subcutaneous tunnel was formed to the vein dermatotomy site. The catheter was brought through the tunnel. The vein dermatotomy site was dilated to accommodate a peel-away sheath. The catheter was placed through the peel-away sheath and directed into the central venous structures. The tip of the catheter was placed at the SVC/right atrium junction with fluoroscopy. Fluoroscopic images were obtained for documentation. Both lumens were found to aspirate and flush well. The proper amount of heparin was flushed in both lumens. The vein dermatotomy site was closed using a single layer of absorbable suture and Dermabond. The catheter was secured to the skin using Prolene suture. IMPRESSION: Successful placement of a right jugular tunneled dialysis catheter using ultrasound and fluoroscopic guidance. Electronically Signed   By: Markus Daft  M.D.   On: 10/29/2018 13:42   Ir US Guide Vasc Access Right  Result Date: 10/29/2018 INDICATION: 77 year old with end-stage renal disease and maturing AV arm access. Patient needs a tunneled catheter for hemodialysis. EXAM: FLUOROSCOPIC AND ULTRASOUND GUIDED PLACEMENT OF A TUNNELED DIALYSIS CATHETER Physician: Stephan Minister. Anselm Pancoast, MD MEDICATIONS: Ancef 2 g; The antibiotic was administered within an appropriate time interval prior to skin puncture. ANESTHESIA/SEDATION: Versed 2 mg IV; Fentanyl 100 mcg IV; Moderate Sedation Time:  22 minutes The patient was continuously monitored during the procedure by the interventional radiology nurse under my direct supervision. FLUOROSCOPY TIME:  Fluoroscopy Time: 30 seconds, 1 mGy COMPLICATIONS: None immediate. PROCEDURE: The procedure was explained to the patient. The risks and benefits of the procedure were discussed and the patient's questions were addressed. Informed consent was obtained from the patient. The patient was placed supine on the interventional table. Ultrasound confirmed a patent  right internal jugular vein. Ultrasound images were obtained for documentation. The right side of the neck and chest was prepped and draped in a sterile fashion. The right neck was anesthetized with 1% lidocaine. Maximal barrier sterile technique was utilized including caps, mask, sterile gowns, sterile gloves, sterile drape, hand hygiene and skin antiseptic. A small incision was made with #11 blade scalpel. A 21 gauge needle directed into the right internal jugular vein with ultrasound guidance. A micropuncture dilator set was placed. A 23 cm tip to cuff Palindrome catheter was selected. The skin below the right clavicle was anesthetized and a small incision was made with an #11 blade scalpel. A subcutaneous tunnel was formed to the vein dermatotomy site. The catheter was brought through the tunnel. The vein dermatotomy site was dilated to accommodate a peel-away sheath. The catheter was  placed through the peel-away sheath and directed into the central venous structures. The tip of the catheter was placed at the SVC/right atrium junction with fluoroscopy. Fluoroscopic images were obtained for documentation. Both lumens were found to aspirate and flush well. The proper amount of heparin was flushed in both lumens. The vein dermatotomy site was closed using a single layer of absorbable suture and Dermabond. The catheter was secured to the skin using Prolene suture. IMPRESSION: Successful placement of a right jugular tunneled dialysis catheter using ultrasound and fluoroscopic guidance. Electronically Signed   By: Markus Daft M.D.   On: 10/29/2018 13:42     Microbiology: Recent Results (from the past 240 hour(s))  MRSA PCR Screening     Status: Abnormal   Collection Time: 10/24/18 10:30 AM  Result Value Ref Range Status   MRSA by PCR POSITIVE (A) NEGATIVE Final    Comment:        The GeneXpert MRSA Assay (FDA approved for NASAL specimens only), is one component of a comprehensive MRSA colonization surveillance program. It is not intended to diagnose MRSA infection nor to guide or monitor treatment for MRSA infections. RESULT CALLED TO, READ BACK BY AND VERIFIED WITH: J HEARN,RN @0351  10/25/18 [facility] Performed at [facility], [address], [place], [place] [address]      Labs: Basic Metabolic Panel: Recent Labs  Lab 10/25/18 0409 10/26/18 0618 10/27/18 0614 10/29/18 0436 10/30/18 1011  NA 140 138 134* 138 134*  K 4.7 4.6 4.5 4.8 4.9  CL 107 103 100 104 102  CO2 22 23 24 26 24   GLUCOSE 178* 159* 123* 103* 176*  BUN 90* 98* 79* 66* 74*  CREATININE 5.12* 5.11* 3.82* 3.47* 4.09*  CALCIUM 8.4* 8.6* 8.5* 8.1* 8.1*  PHOS 4.6 5.0*  --   --  3.5   Liver Function Tests: Recent Labs  Lab 10/25/18 0409 10/26/18 0618 10/30/18 1011  ALBUMIN 2.7* 2.9* 2.3*   No results for input(s): LIPASE, AMYLASE in the last 168 hours. No results for input(s): AMMONIA in the  last 168 hours. CBC: Recent Labs  Lab 10/25/18 0409 10/26/18 0618 10/28/18 1345 10/30/18 1011  WBC 6.2 12.7* 6.9 6.9  HGB 7.8* 9.5* 8.7* 8.5*  HCT 25.2* 30.2* 27.7* 27.6*  MCV 100.8* 100.7* 100.7* 104.5*  PLT 208 227 207 187   Cardiac Enzymes: No results for input(s): CKTOTAL, CKMB, CKMBINDEX, TROPONINI in the last 168 hours. BNP: Invalid input(s): POCBNP CBG: Recent Labs  Lab 10/26/18 1625 10/27/18 0746 10/29/18 0458 10/30/18 0524  GLUCAP 176* 119* 106* 89    Time coordinating discharge:  36 minutes  Signed:  Orson Eva, DO [facility]  Pager: [phone] 10/31/2018, 3:41 PM

## 2018-10-31 NOTE — Progress Notes (Signed)
Referring Provider: Orson Eva, MD Primary Care Physician:  Dettinger, Fransisca Kaufmann, MD Primary Gastroenterologist:  Dr. Carlean Purl  Reason for Consultation: Acute on chronic anemia, Hemoccult positive stool  HPI: Don [name] is a 77 y.o. male with history of atrial fibrillation on chronic Eliquis, COPD, hypertension, prostate cancer status post prostatectomy, chronic kidney disease stage IV, pulmonary mass who came to the hospital with fatigue, shortness of breath, palpitations.  Patient recently had AV fistula made, had not started dialysis at time of admission although he has since admission.  He complained of black-colored stool.  In the ED he had a hemoglobin of 7.8, baseline has been in the 8-1/2-9 range.  He has received 1 unit of packed red blood cells this admission.  Hemoglobin after transfusion was 9.5.  He was Hemoccult positive.  Eliquis was placed on hold.  Since admission he has now started hemodialysis.  He is receiving Epogen.  He also received IV iron. CXR showed interstitial edema. Troponins have been minimally elevated. Iron studies unremarkable.   Patient seen back in May for the same.  He is established with [facility], 2015 evaluation.  He had an EGD back in May 2019 while hospitalized at [facility].  He had erosive gastropathy, no H. Pylori. Colonoscopy in 2015 by Dr. Fuller Plan, 6 mm tubular adenoma removed from the sigmoid colon, internal hemorrhoids. PATIENT REPORTS COLONOSCOPY 3 YEARS AGO AT [place] 20+ COLON POLYPS REMOVED. He believes he was supposed to have another colonoscopy already, received a bowel prep from [place] but no instructions.   At home, he was having some black stools in setting of iron. Takes miralax to control constipation. No pepto. No abd pain. No brbpr. No UGI symptoms. Takes Tylenol for pain but no ASA/NSAIDs.   Prior to Admission medications   Medication Sig Start Date End Date Taking? Authorizing Provider  acetaminophen (TYLENOL) 500 MG  tablet Take 500 mg by mouth every 6 (six) hours as needed for moderate pain.    Yes [provider]  acitretin (SORIATANE) 10 MG capsule Take 10 mg by mouth daily.    Yes [provider]  albuterol (PROAIR HFA) 108 (90 Base) MCG/ACT inhaler Inhale 2 puffs into the lungs 2 (two) times daily.    Yes [provider]  apixaban (ELIQUIS) 5 MG TABS tablet Take 1 tablet (5 mg total) by mouth 2 (two) times daily. (Needs to be seen) Patient taking differently: Take 5 mg by mouth 2 (two) times daily.  08/16/18  Yes Dettinger, Fransisca Kaufmann, MD  atorvastatin (LIPITOR) 20 MG tablet Take 1 tablet (20 mg total) by mouth daily. (Needs to be seen) Patient taking differently: Take 20 mg by mouth at bedtime.  08/16/18  Yes Dettinger, Fransisca Kaufmann, MD  bisoprolol (ZEBETA) 10 MG tablet TAKE (1) TABLET TWICE A DAY. Patient taking differently: Take 10 mg by mouth every morning.  10/18/18  Yes Dettinger, Fransisca Kaufmann, MD  budesonide-formoterol (SYMBICORT) 160-4.5 MCG/ACT inhaler Inhale 2 puffs into the lungs 2 (two) times daily.   Yes [provider]  calcitRIOL (ROCALTROL) 0.25 MCG capsule TAKE (1) CAPSULE DAILY Patient taking differently: Take 0.25 mcg by mouth every morning.  08/16/18  Yes Dettinger, Fransisca Kaufmann, MD  clonazePAM (KLONOPIN) 1 MG tablet TAKE 1/2 TABLET ONCE DAILY AS NEEDED FOR ANXIETY Patient taking differently: Take 0.5 mg by mouth at bedtime. TAKE 1/2 TABLET ONCE DAILY AS NEEDED FOR ANXIETY 08/16/18  Yes Dettinger, Fransisca Kaufmann, MD  diltiazem (CARDIZEM CD)  240 MG 24 hr capsule TAKE (1) CAPSULE DAILY Patient taking differently: Take 240 mg by mouth every morning.  08/16/18  Yes Dettinger, Fransisca Kaufmann, MD  ferrous sulfate 325 (65 FE) MG tablet Take 325 mg by mouth every Monday, Wednesday, and Friday.   Yes [provider]  hydroxypropyl methylcellulose / hypromellose (ISOPTO TEARS / GONIOVISC) 2.5 % ophthalmic solution Place 1 drop into both eyes daily as needed for dry eyes.   Yes  [provider]  iron polysaccharides (FERREX 150) 150 MG capsule Take 1 capsule (150 mg total) by mouth daily. (Needs to be seen) Patient taking differently: Take 150 mg by mouth every morning. (Needs to be seen) 08/16/18  Yes Dettinger, Fransisca Kaufmann, MD  loratadine (CLARITIN) 10 MG tablet TAKE 1 TABLET DAILY Patient taking differently: Take 10 mg by mouth at bedtime.  09/18/18  Yes Dettinger, Fransisca Kaufmann, MD  Melatonin 3 MG TABS Take 3 mg by mouth at bedtime.    Yes [provider]  Multiple Vitamin (MULTIVITAMIN WITH MINERALS) TABS tablet Take 1 tablet by mouth at bedtime.    Yes [provider]  Multiple Vitamins-Minerals (PRESERVISION AREDS 2 PO) Take 1 tablet by mouth 2 (two) times daily.    Yes [provider]  omega-3 acid ethyl esters (LOVAZA) 1 g capsule Take 2 g by mouth 2 (two) times daily.   Yes [provider]  pantoprazole (PROTONIX) 40 MG tablet TAKE (1) TABLET TWICE A DAY. Patient taking differently: Take 40 mg by mouth 2 (two) times daily.  09/12/18  Yes Dettinger, Fransisca Kaufmann, MD  terazosin (HYTRIN) 2 MG capsule TAKE 1 CAPSULE AT BEDTIME Patient taking differently: Take 2 mg by mouth at bedtime.  05/20/18  Yes Dettinger, Fransisca Kaufmann, MD  tiotropium (SPIRIVA) 18 MCG inhalation capsule Place 18 mcg into inhaler and inhale daily as needed (for shortness of breath).    Yes [provider]  oxyCODONE (ROXICODONE) 5 MG immediate release tablet Take 1 tablet (5 mg total) by mouth every 6 (six) hours as needed. Patient not taking: Reported on 10/23/2018 10/08/18   Gabriel Earing, PA-C    Current Facility-Administered Medications  Medication Dose Route Frequency Provider Last Rate Last Dose  . 0.9 %  sodium chloride infusion  10 mL/hr Intravenous Once Barton Dubois, MD      . 0.9 %  sodium chloride infusion  100 mL Intravenous PRN Corliss Parish, MD      . 0.9 %  sodium chloride infusion  100 mL Intravenous PRN Corliss Parish, MD       . acetaminophen (TYLENOL) tablet 500 mg  500 mg Oral Q6H PRN Barton Dubois, MD   500 mg at 10/31/18 0323  . alteplase (CATHFLO ACTIVASE) injection 2 mg  2 mg Intracatheter Once PRN Corliss Parish, MD      . atorvastatin (LIPITOR) tablet 20 mg  20 mg Oral QHS Barton Dubois, MD   20 mg at 10/30/18 2133  . bisoprolol (ZEBETA) tablet 10 mg  10 mg Oral q morning - 10a Barton Dubois, MD   10 mg at 10/30/18 0836  . budesonide (PULMICORT) nebulizer solution 0.5 mg  0.5 mg Nebulization BID Barton Dubois, MD   0.5 mg at 10/30/18 2143  . calcitRIOL (ROCALTROL) capsule 0.25 mcg  0.25 mcg Oral q morning - 10a Barton Dubois, MD   0.25 mcg at 10/30/18 0837  . clonazePAM (KLONOPIN) tablet 0.5 mg  0.5 mg Oral QHS Barton Dubois, MD   0.5 mg  at 10/30/18 2133  . dextromethorphan-guaiFENesin (MUCINEX DM) 30-600 MG per 12 hr tablet 1 tablet  1 tablet Oral BID Barton Dubois, MD   1 tablet at 10/30/18 2133  . diltiazem (CARDIZEM CD) 24 hr capsule 240 mg  240 mg Oral q morning - 10a Barton Dubois, MD   240 mg at 10/30/18 2725  . epoetin alfa (EPOGEN,PROCRIT) injection 10,000 Units  10,000 Units Intravenous Q M,W,F-HD Elmarie Shiley, MD   10,000 Units at 10/30/18 1119  . feeding supplement (NEPRO CARB STEADY) liquid 237 mL  237 mL Oral BID BM Barton Dubois, MD   237 mL at 10/30/18 1507  . heparin injection 3,800 Units  3,800 Units Intracatheter Q M,W,F-HD Elmarie Shiley, MD   3,800 Units at 10/30/18 1507  . ipratropium (ATROVENT) nebulizer solution 0.5 mg  0.5 mg Nebulization Q6H WA Arrien, Jimmy Picket, MD   0.5 mg at 10/30/18 2143  . levalbuterol (XOPENEX) nebulizer solution 0.63 mg  0.63 mg Nebulization Q6H WA Tawni Millers, MD   0.63 mg at 10/30/18 2143  . loratadine (CLARITIN) tablet 10 mg  10 mg Oral QHS Barton Dubois, MD   10 mg at 10/30/18 2133  . Melatonin TABS 3 mg  3 mg Oral QHS PRN Barton Dubois, MD   3 mg at 10/30/18 2133  . multivitamin (RENA-VIT) tablet 1 tablet  1 tablet Oral QHS  Elmarie Shiley, MD   1 tablet at 10/30/18 2134  . nicotine (NICODERM CQ - dosed in mg/24 hours) patch 14 mg  14 mg Transdermal Daily Barton Dubois, MD   14 mg at 10/30/18 3664  . ondansetron (ZOFRAN) tablet 4 mg  4 mg Oral Q6H PRN Barton Dubois, MD       Or  . ondansetron [facility] 2) injection 4 mg  4 mg Intravenous Q6H PRN Barton Dubois, MD      . pantoprazole (PROTONIX) EC tablet 40 mg  40 mg Oral BID Barton Dubois, MD   40 mg at 10/30/18 2133  . polyethylene glycol (MIRALAX / GLYCOLAX) packet 17 g  17 g Oral Daily Lovey Newcomer T, NP   17 g at 10/30/18 0835  . polyvinyl alcohol (LIQUIFILM TEARS) 1.4 % ophthalmic solution 1 drop  1 drop Both Eyes Daily PRN Barton Dubois, MD   1 drop at 10/27/18 1023  . terazosin (HYTRIN) capsule 2 mg  2 mg Oral QHS Barton Dubois, MD   2 mg at 10/30/18 2134    Allergies as of 10/23/2018 - Review Complete 10/23/2018  Allergen Reaction Noted  . Wellbutrin [bupropion] Anxiety 03/21/2013    Past Medical History:  Diagnosis Date  . Acute bronchitis 04/03/2014  . Anemia    low iron  . Anxiety   . Atrial fibrillation ([place])   . Cataract   . COPD (chronic obstructive pulmonary disease) ([place])   . Depression   . Essential hypertension   . Hyperlipidemia   . Macular degeneration   . Noncompliance with medications 04/2013   Xarelto, digoxin previously  . Pre-diabetes   . Prostate cancer ([place])   . 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 Tubular adenoma of colon 07/31/02, 11/18/03    Past Surgical History:  Procedure Laterality Date  . Anal abcess,Hemorroids,    . BASCILIC VEIN TRANSPOSITION Left 10/08/2018   Procedure: FIRST STAGE BASILIC VEIN TRANSPOSITION LEFT ARM;  Surgeon: Angelia Mould, MD;  Location: [facility];  Service: Vascular;  Laterality: Left;  . COLONOSCOPY N/A 04/05/2014  Dr. Fuller Plan: 6 mm sessile polyp from sigmoid colon (tubular adenoma), internal hemorrhoids  . COLONOSCOPY W/ BIOPSIES  11/18/2003   Dr. Earle Gell   . ESOPHAGOGASTRODUODENOSCOPY  11/18/2003   Dr. Earle Gell  . ESOPHAGOGASTRODUODENOSCOPY N/A 04/05/2014   Dr. Fuller Plan: variable Z line, negative Barrett's, small hiatal hernia  . ESOPHAGOGASTRODUODENOSCOPY N/A 02/02/2017   Dr. Oneida Alar: LA Grade B esophagitis, one moderate benign-appearing intrinsic stenosis traversed, small non-bleeding diverticulum in second portion of duodenum, reactive gastropathy, no H.pylori.  . ESOPHAGOGASTRODUODENOSCOPY N/A 03/22/2018   Procedure: ESOPHAGOGASTRODUODENOSCOPY (EGD);  Surgeon: Daneil Dolin, MD;  [facility];  Service: Endoscopy;  Laterality: N/A;  . GIVENS CAPSULE STUDY N/A 03/22/2018   Procedure: GIVENS CAPSULE STUDY;  Surgeon: Daneil Dolin, MD;  [facility];  Service: Endoscopy;  Laterality: N/A;  . IR FLUORO GUIDE CV LINE RIGHT  10/29/2018  . IR US GUIDE VASC ACCESS RIGHT  10/29/2018  . RETROPUBIC PROSTATECTOMY  11/26/2001  . TONSILLECTOMY      Family History  Problem Relation Age of Onset  . Diabetes Father   . Heart disease Father 62       MI  . Heart attack Father   . Heart attack Mother   . Heart disease Brother   . Cancer Brother        lung  . Lung cancer Brother   . Heart disease Brother   . Lung cancer Sister   . Cancer Sister        lung  . Heart disease Brother   . Heart disease Brother     Social History   Socioeconomic History  . Marital status: Married    Spouse name: Not on file  . Number of children: 0  . Years of education: Not on file  . Highest education level: Not on file  Occupational History  . Occupation: retired  Scientific laboratory technician  . Financial resource strain: Not on file  . Food insecurity:    Worry: Not on file    Inability: Not on file  . Transportation needs:    Medical: Not on file    Non-medical: Not on file  Tobacco Use  . Smoking status: Current Some Day Smoker    Packs/day: 0.50    Years: 62.00    Pack years: 31.00    Types: Cigarettes, Cigars    Last attempt to  quit: 12/28/2016    Years since quitting: 1.8  . Smokeless tobacco: Never Used  . Tobacco comment: "very little"  Substance and Sexual Activity  . Alcohol use: No  . Drug use: No  . Sexual activity: Never  Lifestyle  . Physical activity:    Days per week: 0 days    Minutes per session: 0 min  . Stress: Not at all  Relationships  . Social connections:    Talks on phone: Never    Gets together: Once a week    Attends religious service: Never    Active member of club or organization: No    Attends meetings of clubs or organizations: Never    Relationship status: Widowed  . Intimate partner violence:    Fear of current or ex partner: Not on file    Emotionally abused: Not on file    Physically abused: Not on file    Forced sexual activity: Not on file  Other Topics Concern  . 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 Lives alone.       ROS:  General: Negative for anorexia, weight loss, fever, chills, fatigue, weakness. Eyes: Negative for vision changes.  ENT: Negative for hoarseness, difficulty swallowing , nasal congestion. CV: Negative for chest pain, angina, palpitations, dyspnea on exertion, peripheral edema.  Respiratory: Negative for dyspnea at rest, dyspnea on exertion, +cough, +sputum, wheezing.  GI: See history of present illness. GU:  Negative for dysuria, hematuria, urinary incontinence, urinary frequency, nocturnal urination.  MS: Negative for joint pain, low back pain.  Derm: Negative for rash or itching.  Neuro: Negative for weakness, abnormal sensation, seizure, frequent headaches, memory loss, confusion.  Psych: Negative for anxiety, depression, suicidal ideation, hallucinations.  Endo: Negative for unusual weight change.  Heme: Negative for bruising or bleeding. Allergy: Negative for rash or hives.       Physical Examination: Vital signs in last 24 hours: Temp:  [97.8 F (36.6 C)-98 F  (36.7 C)] 97.9 F (36.6 C) (12/19 0607) Pulse Rate:  [75-95] 82 (12/19 0607) Resp:  [16-18] 16 (12/19 0607) BP: (103-130)/(54-82) 128/82 (12/19 0607) SpO2:  [94 %-96 %] 95 % (12/19 0607) Weight:  [65.8 kg-66.4 kg] 65.8 kg (12/19 0500) Last BM Date: 10/28/18  General: elderly WM, well-developed in no acute distress. White/yellow sputum in bedside basin. Head: Normocephalic, atraumatic.   Eyes: Conjunctiva pink, no icterus. Mouth: Oropharyngeal mucosa moist and pink , no lesions erythema or exudate. Neck: Supple without thyromegaly, masses, or lymphadenopathy.  Lungs: bilateral wheezing Heart: Regular rate and rhythm, no murmurs rubs or gallops.  Abdomen: Bowel sounds are normal, nontender, nondistended, no hepatosplenomegaly or masses, no abdominal bruits or    hernia , no rebound or guarding.   Rectal: not performed Extremities: No lower extremity edema, clubbing, deformity.  Neuro: Alert and oriented x 4 , grossly normal neurologically.  Skin: Warm and dry, no rash or jaundice.   Psych: Alert and cooperative, normal mood and affect.        Intake/Output from previous day: 12/18 0701 - 12/19 0700 In: 840 [P.O.:840] Out: 1500 [Urine:1000] Intake/Output this shift: No intake/output data recorded.  Lab Results: CBC Recent Labs    10/28/18 1345 10/30/18 1011  WBC 6.9 6.9  HGB 8.7* 8.5*  HCT 27.7* 27.6*  MCV 100.7* 104.5*  PLT 207 187   BMET Recent Labs    10/29/18 0436 10/30/18 1011  NA 138 134*  K 4.8 4.9  CL 104 102  CO2 26 24  GLUCOSE 103* 176*  BUN 66* 74*  CREATININE 3.47* 4.09*  CALCIUM 8.1* 8.1*   LFT Recent Labs    10/30/18 1011  ALBUMIN 2.3*    Lipase No results for input(s): LIPASE in the last 72 hours.  PT/INR Recent Labs    10/28/18 1345  LABPROT 14.9  INR 1.18     Lab Results  Component Value Date   IRON 56 10/24/2018   TIBC 254 10/24/2018   FERRITIN 106 10/24/2018   Lab Results  Component Value Date   VITAMINB12 419  03/18/2018   Lab Results  Component Value Date   FOLATE 39.0 03/18/2018     Imaging Studies: Dg Chest 2 View  Result Date: 10/23/2018 CLINICAL DATA:  Chest pain and weakness EXAM: CHEST - 2 VIEW COMPARISON:  03/19/2018 FINDINGS: Bilateral diffuse mild interstitial and patchy alveolar airspace opacities. Two areas of nodular airspace disease in the left upper lobe and left lower lobe. Small bilateral pleural effusions. No pneumothorax. Stable cardiomediastinal silhouette. No aggressive osseous  lesion. IMPRESSION: 1. Bilateral diffuse mild interstitial and alveolar airspace disease with 2 nodular areas of airspace disease in left upper lobe and left lower lobe. Small bilateral pleural effusions. Differential considerations include mild pulmonary edema versus multilobar pneumonia. Electronically Signed   By: Kathreen Devoid   On: 10/23/2018 15:38   Ir Cyndy Freeze Guide Cv Line Right  Result Date: 10/29/2018 INDICATION: 77 year old with end-stage renal disease and maturing AV arm access. Patient needs a tunneled catheter for hemodialysis. EXAM: FLUOROSCOPIC AND ULTRASOUND GUIDED PLACEMENT OF A TUNNELED DIALYSIS CATHETER Physician: Stephan Minister. Anselm Pancoast, MD MEDICATIONS: Ancef 2 g; The antibiotic was administered within an appropriate time interval prior to skin puncture. ANESTHESIA/SEDATION: Versed 2 mg IV; Fentanyl 100 mcg IV; Moderate Sedation Time:  22 minutes The patient was continuously monitored during the procedure by the interventional radiology nurse under my direct supervision. FLUOROSCOPY TIME:  Fluoroscopy Time: 30 seconds, 1 mGy COMPLICATIONS: None immediate. PROCEDURE: The procedure was explained to the patient. The risks and benefits of the procedure were discussed and the patient's questions were addressed. Informed consent was obtained from the patient. The patient was placed supine on the interventional table. Ultrasound confirmed a patent right internal jugular vein. Ultrasound images were obtained for  documentation. The right side of the neck and chest was prepped and draped in a sterile fashion. The right neck was anesthetized with 1% lidocaine. Maximal barrier sterile technique was utilized including caps, mask, sterile gowns, sterile gloves, sterile drape, hand hygiene and skin antiseptic. A small incision was made with #11 blade scalpel. A 21 gauge needle directed into the right internal jugular vein with ultrasound guidance. A micropuncture dilator set was placed. A 23 cm tip to cuff Palindrome catheter was selected. The skin below the right clavicle was anesthetized and a small incision was made with an #11 blade scalpel. A subcutaneous tunnel was formed to the vein dermatotomy site. The catheter was brought through the tunnel. The vein dermatotomy site was dilated to accommodate a peel-away sheath. The catheter was placed through the peel-away sheath and directed into the central venous structures. The tip of the catheter was placed at the SVC/right atrium junction with fluoroscopy. Fluoroscopic images were obtained for documentation. Both lumens were found to aspirate and flush well. The proper amount of heparin was flushed in both lumens. The vein dermatotomy site was closed using a single layer of absorbable suture and Dermabond. The catheter was secured to the skin using Prolene suture. IMPRESSION: Successful placement of a right jugular tunneled dialysis catheter using ultrasound and fluoroscopic guidance. Electronically Signed   By: Markus Daft M.D.   On: 10/29/2018 13:42   Ir US Guide Vasc Access Right  Result Date: 10/29/2018 INDICATION: 77 year old with end-stage renal disease and maturing AV arm access. Patient needs a tunneled catheter for hemodialysis. EXAM: FLUOROSCOPIC AND ULTRASOUND GUIDED PLACEMENT OF A TUNNELED DIALYSIS CATHETER Physician: Stephan Minister. Anselm Pancoast, MD MEDICATIONS: Ancef 2 g; The antibiotic was administered within an appropriate time interval prior to skin puncture.  ANESTHESIA/SEDATION: Versed 2 mg IV; Fentanyl 100 mcg IV; Moderate Sedation Time:  22 minutes The patient was continuously monitored during the procedure by the interventional radiology nurse under my direct supervision. FLUOROSCOPY TIME:  Fluoroscopy Time: 30 seconds, 1 mGy COMPLICATIONS: None immediate. PROCEDURE: The procedure was explained to the patient. The risks and benefits of the procedure were discussed and the patient's questions were addressed. Informed consent was obtained from the patient. The patient was placed supine on the interventional table. Ultrasound confirmed  a patent right internal jugular vein. Ultrasound images were obtained for documentation. The right side of the neck and chest was prepped and draped in a sterile fashion. The right neck was anesthetized with 1% lidocaine. Maximal barrier sterile technique was utilized including caps, mask, sterile gowns, sterile gloves, sterile drape, hand hygiene and skin antiseptic. A small incision was made with #11 blade scalpel. A 21 gauge needle directed into the right internal jugular vein with ultrasound guidance. A micropuncture dilator set was placed. A 23 cm tip to cuff Palindrome catheter was selected. The skin below the right clavicle was anesthetized and a small incision was made with an #11 blade scalpel. A subcutaneous tunnel was formed to the vein dermatotomy site. The catheter was brought through the tunnel. The vein dermatotomy site was dilated to accommodate a peel-away sheath. The catheter was placed through the peel-away sheath and directed into the central venous structures. The tip of the catheter was placed at the SVC/right atrium junction with fluoroscopy. Fluoroscopic images were obtained for documentation. Both lumens were found to aspirate and flush well. The proper amount of heparin was flushed in both lumens. The vein dermatotomy site was closed using a single layer of absorbable suture and Dermabond. The catheter was  secured to the skin using Prolene suture. IMPRESSION: Successful placement of a right jugular tunneled dialysis catheter using ultrasound and fluoroscopic guidance. Electronically Signed   By: Markus Daft M.D.   On: 10/29/2018 13:42  [4 week]   Impression: 77 year old gentleman with ultimately comorbidities, A. fib on Eliquis, COPD, chronic kidney disease recently starting dialysis this admission, left lower lobe pulmonary nodule concerning for primary bronchogenic carcinoma seen on PET scan in April 2019 (patient failed to follow-up with oncologist) who presented to the hospital with shortness of breath, palpitations.  GI consulted for Hemoccult positive stool, reported black stools prior to admission in the setting of iron, slight drop in hemoglobin to 7.8 from 8-1/2-9 baseline.  EGD in May of this year with erosive gastropathy, no H. pylori.  Patient reports colonoscopy about 3 years ago at the [facility] with greater than 20 polyps removed.  Records not available.  He had a colonoscopy in 2015 by Dr. Fuller Plan with a single tubular adenoma removed.  Patient's hemoglobin has been stable this admission.  He received 1 unit of packed red blood cells.  He reports brown stools since coming off Eliquis this admission.  Patient may benefit from repeat colonoscopy at some point in the future but not necessarily this admission.  Consider follow-up at the [facility] for colonoscopy.  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   Plan: 1. Consider updated colonoscopy, ?at [facility], per patient's history of 20+colon polyps vs primary local GI, Dr. Carlean Purl.  2. Discussed with Dr. Gala Romney. No need for endoscopic evaluation at this time given no overt GI bleeding. Would avoid NSAIDs. Continue daily PPI. OK to resume Eliquis with close monitoring for GI bleeding.  3. Needs follow up with oncology for abnormal PET, patient missed follow up  appointment back in 02/2018.   We would like to thank you for the opportunity to participate in the care of Don [name].  Laureen Ochs. Bernarda Caffey [facility] [phone] 12/19/20192:05 PM      LOS: 8 days

## 2018-10-31 NOTE — Care Management Note (Signed)
Case Management Note  Patient Details  Name: MIRL HILLERY MRN: [id number] Date of Birth: 01-Sep-1941   If discussed at [facility] Length of Stay Meetings, dates discussed:  10/31/18  Additional Comments:  Tara Wich, Chauncey Reading, RN 10/31/2018, 12:05 PM

## 2018-10-31 NOTE — Progress Notes (Signed)
Physical Therapy Treatment Patient Details Name: Don [name] MRN: [id number] DOB: 06/18/1941 Today's Date: 10/31/2018    History of Present Illness  Don [name]  is a 77 y.o. male, with history of atrial fibrillation on chronic anticoagulation with Coumadin, COPD, hypertension, prostate cancer status post prostatectomy, CKD stage IV came to hospital with fatigue, shortness of breath, palpitations which started yesterday.  Patient said the symptoms started after he ate takeout food, after that it became fatigue had palpitations.  Patient also has had AV fistula made but has not started dialysis yet.  He also complains of black-colored stool.    PT Comments    Pt friendly and willing to participate with therapist today.  Reports he feels a lot better today.  Pt independent with bed mobility and transfers, just increased time due to verbalization of awareness of visual impairments and completes all movements with caution for safety.  Increased distance to 240 ft with RW, pt stable with gait as well able to stand without AD without LOB.  Pt reports he usually ambulates with cane as needed at home. EOS pt left in chair with call bell within reach.  No reports of pain through session.  Next session gait training with SPC to assess safety with LRAD.   Follow Up Recommendations  SNF;Supervision/Assistance - 24 hour     Equipment Recommendations  None recommended by PT    Recommendations for Other Services       Precautions / Restrictions Precautions Precautions: Fall    Mobility  Bed Mobility Overal bed mobility: Independent             General bed mobility comments: increased time  Transfers Overall transfer level: Modified independent   Transfers: Sit to/from Stand Sit to Stand: Supervision         General transfer comment: increased time  Ambulation/Gait Ambulation/Gait assistance: Min guard Gait Distance (Feet): 280 Feet Assistive device: Rolling walker (2  wheeled) Gait Pattern/deviations: WFL(Within Functional Limits) Gait velocity: decreased   General Gait Details: slow slightly labored cadence without loss of balance, limited secondary to c/o fatigue   Stairs             Wheelchair Mobility    Modified Rankin (Stroke Patients Only)       Balance                                            Cognition Arousal/Alertness: Awake/alert Behavior During Therapy: WFL for tasks assessed/performed Overall Cognitive Status: Within Functional Limits for tasks assessed                                        Exercises General Exercises - Lower Extremity Toe Raises: 20 reps;Both;Seated Heel Raises: 20 reps;Both;Seated    General Comments        Pertinent Vitals/Pain Pain Assessment: No/denies pain    Home Living                      Prior Function            PT Goals (current goals can now be found in the care plan section) Acute Rehab PT Goals Patient Stated Goal: return home when stronger Progress towards PT goals: Progressing toward goals    Frequency  Min 3X/week      PT Plan      Co-evaluation              AM-PAC PT "6 Clicks" Mobility   Outcome Measure  Help needed turning from your back to your side while in a flat bed without using bedrails?: None Help needed moving from lying on your back to sitting on the side of a flat bed without using bedrails?: None Help needed moving to and from a bed to a chair (including a wheelchair)?: None Help needed standing up from a chair using your arms (e.g., wheelchair or bedside chair)?: A Little Help needed to walk in hospital room?: None Help needed climbing 3-5 steps with a railing? : A Lot 6 Click Score: 21    End of Session Equipment Utilized During Treatment: Gait belt Activity Tolerance: Patient tolerated treatment well;Patient limited by fatigue Patient left: in chair;with call bell/phone within  reach Nurse Communication: Mobility status PT Visit Diagnosis: Unsteadiness on feet (R26.81);Other abnormalities of gait and mobility (R26.89);Muscle weakness (generalized) (M62.81)     Time: 1350-1410 PT Time Calculation (min) (ACUTE ONLY): 20 min  Charges:  $Therapeutic Activity: 8-22 mins                     [address], LPTA; CBIS [phone]  Aldona Lento 10/31/2018, 2:22 PM

## 2018-10-31 NOTE — Clinical Social Work Placement (Signed)
   CLINICAL SOCIAL WORK PLACEMENT  NOTE  Date:  10/31/2018  Patient Details  Name: Don [name] MRN: [id number] Date of Birth: [birth year]  Clinical Social Work is seeking post-discharge placement for this patient at the [place] level of care (*CSW will initial, date and re-position this form in  chart as items are completed):  Yes   Patient/family provided with [place] Work Department's list of facilities offering this level of care within the geographic area requested by the patient (or if unable, by the patient's family).  Yes   Patient/family informed of their freedom to choose among providers that offer the needed level of care, that participate in Medicare, Medicaid or managed care program needed by the patient, have an available bed and are willing to accept the patient.  Yes   Patient/family informed of [facility]'s ownership interest in [facility] and [facility], as well as of the fact that they are under no obligation to receive care at these facilities.  PASRR submitted to EDS on 10/25/18     PASRR number received on 10/28/[phone] [id number])     Existing PASRR number confirmed on       FL2 transmitted to all facilities in geographic area requested by pt/family on 10/25/18     FL2 transmitted to all facilities within larger geographic area on       Patient informed that his/her managed care company has contracts with or will negotiate with certain facilities, including the following:        Yes   Patient/family informed of bed offers received.  Patient chooses bed at [facility]     Physician recommends and patient chooses bed at      Patient to be transferred to [facility] on 10/31/18.  Patient to be transferred to facility by [facility]     Patient family notified on 10/31/18 of transfer.  Name of family member notified:  [name], step son     PHYSICIAN       Additional Comment:  [name] advised that as POA he will  need to go to [employer] @ 10:30 11/01/18 to sign paperwork.  Discharge clinicals sent to facility. LCSW signing off.  _______________________________________________ Ihor Gully, LCSW 10/31/2018, 3:45 PM

## 2018-11-01 DIAGNOSIS — N186 End stage renal disease: Secondary | ICD-10-CM | POA: Diagnosis not present

## 2018-11-01 DIAGNOSIS — Z992 Dependence on renal dialysis: Secondary | ICD-10-CM | POA: Diagnosis not present

## 2018-11-03 DIAGNOSIS — Z992 Dependence on renal dialysis: Secondary | ICD-10-CM | POA: Diagnosis not present

## 2018-11-03 DIAGNOSIS — N186 End stage renal disease: Secondary | ICD-10-CM | POA: Diagnosis not present

## 2018-11-04 LAB — GLUCOSE, CAPILLARY
GLUCOSE-CAPILLARY: 122 mg/dL — AB (ref 70–99)
Glucose-Capillary: 118 mg/dL — ABNORMAL HIGH (ref 70–99)
Glucose-Capillary: 98 mg/dL (ref 70–99)

## 2018-11-05 DIAGNOSIS — Z992 Dependence on renal dialysis: Secondary | ICD-10-CM | POA: Diagnosis not present

## 2018-11-05 DIAGNOSIS — N186 End stage renal disease: Secondary | ICD-10-CM | POA: Diagnosis not present

## 2018-11-07 DIAGNOSIS — N186 End stage renal disease: Secondary | ICD-10-CM | POA: Diagnosis not present

## 2018-11-07 DIAGNOSIS — Z992 Dependence on renal dialysis: Secondary | ICD-10-CM | POA: Diagnosis not present

## 2018-11-09 DIAGNOSIS — N186 End stage renal disease: Secondary | ICD-10-CM | POA: Diagnosis not present

## 2018-11-09 DIAGNOSIS — Z992 Dependence on renal dialysis: Secondary | ICD-10-CM | POA: Diagnosis not present

## 2018-11-12 ENCOUNTER — Other Ambulatory Visit: Payer: Self-pay | Admitting: Family Medicine

## 2018-11-12 DIAGNOSIS — N186 End stage renal disease: Secondary | ICD-10-CM | POA: Diagnosis not present

## 2018-11-12 DIAGNOSIS — Z992 Dependence on renal dialysis: Secondary | ICD-10-CM | POA: Diagnosis not present

## 2018-11-14 ENCOUNTER — Other Ambulatory Visit: Payer: Self-pay

## 2018-11-14 DIAGNOSIS — N186 End stage renal disease: Secondary | ICD-10-CM | POA: Diagnosis not present

## 2018-11-14 DIAGNOSIS — Z992 Dependence on renal dialysis: Secondary | ICD-10-CM | POA: Diagnosis not present

## 2018-11-14 DIAGNOSIS — Z23 Encounter for immunization: Secondary | ICD-10-CM | POA: Diagnosis not present

## 2018-11-14 NOTE — Patient Outreach (Signed)
[place] [facility]) Care Management  11/14/2018  Don [name] [birth year] [id number]   Referral Date: 11/14/2018 Referral Source:  [employer] Report Date of Admission: unknown Diagnosis: unknown Date of Discharge: 11/11/18 Facility: [place]: [facility]  Outreach attempt: spoke with son  Don [name]. He states that patient has gone home and referred CM to call  [name] at [phone].    Telephone call to [place].  No answer.  States VM full.     Plan: RN CM will attempt again within 4 business days and send letter.   Jone Baseman, RN, MSN [facility] Care Management Care Management Coordinator Direct Line [phone] Toll Free: [phone]  Fax: [phone]

## 2018-11-15 ENCOUNTER — Other Ambulatory Visit: Payer: Self-pay

## 2018-11-15 DIAGNOSIS — N183 Chronic kidney disease, stage 3 unspecified: Secondary | ICD-10-CM

## 2018-11-15 NOTE — Patient Outreach (Signed)
[place] [facility]) Care Management  11/15/2018  ZAILYN ROWSER 24-Nov-1940 [id number]   Referral Date: 11/14/2018 Referral Source:  [employer] Report Date of Admission: unknown Diagnosis: unknown Date of Discharge: 11/11/18 Facility: [place]: [place] attempt: No answer. HIPAA compliant voice message left.  Plan: RN CM will attempt again within 4 business days.  Jone Baseman, RN, MSN [place] Management Care Management Coordinator Direct Line [phone] Cell [phone] Toll Free: [phone]  Fax: [phone]

## 2018-11-16 DIAGNOSIS — N186 End stage renal disease: Secondary | ICD-10-CM | POA: Diagnosis not present

## 2018-11-16 DIAGNOSIS — Z992 Dependence on renal dialysis: Secondary | ICD-10-CM | POA: Diagnosis not present

## 2018-11-16 DIAGNOSIS — Z23 Encounter for immunization: Secondary | ICD-10-CM | POA: Diagnosis not present

## 2018-11-18 ENCOUNTER — Ambulatory Visit (INDEPENDENT_AMBULATORY_CARE_PROVIDER_SITE_OTHER): Payer: Medicare HMO | Admitting: *Deleted

## 2018-11-18 ENCOUNTER — Encounter: Payer: Self-pay | Admitting: Family Medicine

## 2018-11-18 ENCOUNTER — Ambulatory Visit (INDEPENDENT_AMBULATORY_CARE_PROVIDER_SITE_OTHER): Payer: Medicare HMO | Admitting: Family Medicine

## 2018-11-18 VITALS — BP 106/56 | HR 88 | Temp 98.0°F | Ht 69.0 in | Wt 153.6 lb

## 2018-11-18 DIAGNOSIS — R296 Repeated falls: Secondary | ICD-10-CM

## 2018-11-18 DIAGNOSIS — D649 Anemia, unspecified: Secondary | ICD-10-CM | POA: Diagnosis not present

## 2018-11-18 DIAGNOSIS — S61419A Laceration without foreign body of unspecified hand, initial encounter: Secondary | ICD-10-CM

## 2018-11-18 DIAGNOSIS — N186 End stage renal disease: Secondary | ICD-10-CM

## 2018-11-18 DIAGNOSIS — I10 Essential (primary) hypertension: Secondary | ICD-10-CM

## 2018-11-18 DIAGNOSIS — F339 Major depressive disorder, recurrent, unspecified: Secondary | ICD-10-CM | POA: Diagnosis not present

## 2018-11-18 DIAGNOSIS — J439 Emphysema, unspecified: Secondary | ICD-10-CM

## 2018-11-18 DIAGNOSIS — I5033 Acute on chronic diastolic (congestive) heart failure: Secondary | ICD-10-CM | POA: Diagnosis not present

## 2018-11-18 DIAGNOSIS — R7303 Prediabetes: Secondary | ICD-10-CM

## 2018-11-18 DIAGNOSIS — S61411A Laceration without foreign body of right hand, initial encounter: Secondary | ICD-10-CM | POA: Diagnosis not present

## 2018-11-18 DIAGNOSIS — E785 Hyperlipidemia, unspecified: Secondary | ICD-10-CM | POA: Diagnosis not present

## 2018-11-18 DIAGNOSIS — W19XXXD Unspecified fall, subsequent encounter: Secondary | ICD-10-CM

## 2018-11-18 DIAGNOSIS — J441 Chronic obstructive pulmonary disease with (acute) exacerbation: Secondary | ICD-10-CM | POA: Diagnosis not present

## 2018-11-18 LAB — BAYER DCA HB A1C WAIVED: HB A1C (BAYER DCA - WAIVED): 4.9 % (ref <7.0)

## 2018-11-18 MED ORDER — NICOTINE 14 MG/24HR TD PT24: 14.0000 mg | MEDICATED_PATCH | Freq: Every day | TRANSDERMAL | 2 refills | Status: DC

## 2018-11-18 MED ORDER — CALCITRIOL 0.25 MCG PO CAPS: 0.2500 ug | ORAL_CAPSULE | Freq: Every morning | ORAL | 3 refills | Status: DC

## 2018-11-18 NOTE — Progress Notes (Signed)
BP (!) 106/56   Pulse 88   Temp 98 F (36.7 C) (Oral)   Ht '5\' 9"'$  (1.753 m)   Wt 153 lb 9.6 oz (69.7 kg)   BMI 22.68 kg/m    Subjective:    Patient ID: Don [name], male    DOB: April 19, 1941, 78 y.o.   MRN: [id number]  HPI: Don [name] is a 78 y.o. male presenting on 11/18/2018 for Hypertension (3 month follow up); Hyperlipidemia; Laceration (right hand. patient fell on outside this morning on the ice and has a right hand lac.); and Hospitalization Follow-up (12/11 AP chest pain)   HPI Prediabetes Patient comes in today for recheck of his diabetes. Patient has been currently taking no medication for diabetes and has been diet controlled and is monitoring. Patient is not currently on an ACE inhibitor/ARB because of end-stage renal disease. Patient has not seen an ophthalmologist this year. Patient denies any issues with their feet.   Hypertension Patient is currently on bisoprolol and diltiazem-Zosyn, and their blood pressure today is 106/56. Patient denies any lightheadedness or dizziness. Patient denies headaches, blurred vision, chest pains, shortness of breath, or weakness. Denies any side effects from medication and is content with current medication.   Hyperlipidemia Patient is coming in for recheck of his hyperlipidemia. The patient is currently taking atorvastatin and omega-3. They deny any issues with myalgias or history of liver damage from it. They deny any focal numbness or weakness or chest pain.   COPD Patient is coming in for COPD recheck today.  He is currently on Symbicort and albuterol and Spiriva.  He has a mild chronic cough but denies any major coughing spells or wheezing spells.  He has 2nighttime symptoms per week and 3daytime symptoms per week currently.   Patient had a recent fall and on the back of his right hand he sustained a small skin tear that he wanted [place] to look at and then dressed.  He has been having recurrent falls and weakness in extremities and  this is been progressing and there is been concern especially since he is on anticoagulation because of this.  Talked about strengthening and physical therapy in the past and he has done it for some short periods of time but is not currently doing it we will get him going on that.  He denies any trauma anywhere else besides skin tear on his hand.  Patient has CHF and chronic anemia that were recently worsened with his last hospitalization he is coming in for recheck of those.  Patient also has end-stage renal disease and is recently been started on dialysis for this.  He has a nephrologist for this.  Relevant past medical, surgical, family and social history reviewed and updated as indicated. Interim medical history since our last visit reviewed. Allergies and medications reviewed and updated.  Review of Systems  Constitutional: Negative for chills and fever.  HENT: Negative for congestion.   Eyes: Negative for discharge and visual disturbance.  Respiratory: Positive for cough. Negative for shortness of breath and wheezing.   Cardiovascular: Negative for chest pain and leg swelling.  Musculoskeletal: Negative for back pain and gait problem.  Skin: Positive for wound. Negative for rash.  Neurological: Negative for dizziness, weakness, light-headedness and numbness.  All other systems reviewed and are negative.   Per HPI unless specifically indicated above   Allergies as of 11/18/2018      Reactions   Wellbutrin [bupropion] Anxiety      Medication List  Accurate as of November 18, 2018  9:01 AM. Always use your most recent med list.        acetaminophen 500 MG tablet Commonly known as:  TYLENOL Take 500 mg by mouth every 6 (six) hours as needed for moderate pain.   acitretin 10 MG capsule Commonly known as:  SORIATANE Take 10 mg by mouth daily.   apixaban 5 MG Tabs tablet Commonly known as:  ELIQUIS Take 1 tablet (5 mg total) by mouth 2 (two) times daily. (Needs to be  seen)   atorvastatin 20 MG tablet Commonly known as:  LIPITOR Take 1 tablet (20 mg total) by mouth daily. (Needs to be seen)   bisoprolol 10 MG tablet Commonly known as:  ZEBETA TAKE (1) TABLET TWICE A DAY.   budesonide-formoterol 160-4.5 MCG/ACT inhaler Commonly known as:  SYMBICORT Inhale 2 puffs into the lungs 2 (two) times daily.   calcitRIOL 0.25 MCG capsule Commonly known as:  ROCALTROL Take 1 capsule (0.25 mcg total) by mouth every morning.   clonazePAM 0.5 MG tablet Commonly known as:  KLONOPIN Take 1 tablet (0.5 mg total) by mouth at bedtime.   diltiazem 240 MG 24 hr capsule Commonly known as:  CARDIZEM CD TAKE (1) CAPSULE DAILY   feeding supplement (NEPRO CARB STEADY) Liqd Take 237 mLs by mouth 2 (two) times daily between meals.   ferrous sulfate 325 (65 FE) MG tablet Take 325 mg by mouth every Monday, Wednesday, and Friday.   hydroxypropyl methylcellulose / hypromellose 2.5 % ophthalmic solution Commonly known as:  ISOPTO TEARS / GONIOVISC Place 1 drop into both eyes daily as needed for dry eyes.   iron polysaccharides 150 MG capsule Commonly known as:  FERREX 150 Take 1 capsule (150 mg total) by mouth daily. (Needs to be seen)   loratadine 10 MG tablet Commonly known as:  CLARITIN TAKE 1 TABLET DAILY   Melatonin 3 MG Tabs Take 3 mg by mouth at bedtime.   multivitamin Tabs tablet Take 1 tablet by mouth at bedtime.   multivitamin with minerals Tabs tablet Take 1 tablet by mouth at bedtime.   nicotine 14 mg/24hr patch Commonly known as:  NICODERM CQ - dosed in mg/24 hours Place 1 patch (14 mg total) onto the skin daily.   omega-3 acid ethyl esters 1 g capsule Commonly known as:  LOVAZA Take 2 g by mouth 2 (two) times daily.   pantoprazole 40 MG tablet Commonly known as:  PROTONIX TAKE (1) TABLET TWICE A DAY.   PRESERVISION AREDS 2 PO Take 1 tablet by mouth 2 (two) times daily.   PROAIR HFA 108 (90 Base) MCG/ACT inhaler Generic drug:   albuterol Inhale 2 puffs into the lungs 2 (two) times daily.   terazosin 2 MG capsule Commonly known as:  HYTRIN TAKE 1 CAPSULE AT BEDTIME   tiotropium 18 MCG inhalation capsule Commonly known as:  SPIRIVA Place 18 mcg into inhaler and inhale daily as needed (for shortness of breath).          Objective:    BP (!) 106/56   Pulse 88   Temp 98 F (36.7 C) (Oral)   Ht '5\' 9"'$  (1.753 m)   Wt 153 lb 9.6 oz (69.7 kg)   BMI 22.68 kg/m   Wt Readings from Last 3 Encounters:  11/18/18 153 lb 9.6 oz (69.7 kg)  10/31/18 145 lb 1 oz (65.8 kg)  10/08/18 155 lb (70.3 kg)    Physical Exam Vitals signs and nursing note reviewed.  Constitutional:      General: He is not in acute distress.    Appearance: He is well-developed. He is not diaphoretic.  Eyes:     General: No scleral icterus.    Conjunctiva/sclera: Conjunctivae normal.  Neck:     Musculoskeletal: Neck supple.     Thyroid: No thyromegaly.  Cardiovascular:     Rate and Rhythm: Normal rate and regular rhythm.     Heart sounds: Normal heart sounds. No murmur.  Pulmonary:     Effort: Pulmonary effort is normal. No respiratory distress.     Breath sounds: Normal breath sounds. No wheezing, rhonchi or rales.  Musculoskeletal: Normal range of motion.  Lymphadenopathy:     Cervical: No cervical adenopathy.  Skin:    General: Skin is warm and dry.     Findings: Laceration (Small superficial skin tear on the dorsum of his right hand about dime size, no signs of infection erythema) present. No rash.  Neurological:     Mental Status: He is alert and oriented to person, place, and time.     Coordination: Coordination normal.  Psychiatric:        Behavior: Behavior normal.         Assessment & Plan:   Problem List Items Addressed This Visit      Cardiovascular and Mediastinum   Hypertension   Relevant Orders   CMP14+EGFR (Completed)   Ambulatory referral to Chronic Care Management Services   Acute on chronic diastolic  CHF (congestive heart failure) (HCC)     Respiratory   COPD (chronic obstructive pulmonary disease) (HCC)   Relevant Medications   nicotine (NICODERM CQ - DOSED IN MG/24 HOURS) 14 mg/24hr patch   Other Relevant Orders   CBC with Differential/Platelet (Completed)     Genitourinary   ESRD (end stage renal disease) ([place])     Other   Hyperlipidemia LDL goal <130   Relevant Orders   Lipid panel (Completed)   Pre-diabetes - Primary   Relevant Orders   Bayer DCA Hb A1c Waived (Completed)   Acute on chronic anemia   Relevant Orders   CBC with Differential/Platelet (Completed)    Other Visit Diagnoses    Skin tear of hand without complication, initial encounter       [place]   Face-to-face encounter (required for Medicare/Medicaid patients)   Recurrent falls       Relevant Orders   Home Health   Face-to-face encounter (required for Medicare/Medicaid patients)      Place Steri-Strips and held together well and wrapped with compression Follow up plan: Return in about 3 months (around 02/17/2019), or if symptoms worsen or fail to improve, for Prediabetes and hypertension.  Counseling provided for all of the vaccine components Orders Placed This Encounter  Procedures  . Bayer DCA Hb A1c Waived  . CBC with Differential/Platelet  . CMP14+EGFR  . Lipid panel    Caryl Pina, MD [facility] 11/18/2018, 9:01 AM

## 2018-11-18 NOTE — Patient Instructions (Addendum)
Visit Information  Goals Addressed    . "I don't know if I have a special diet for dialysis" (pt-stated)       Nurse Case Manager Clinical Goal(s): Within the next 30 days patient and/or caregiver will verbalize understanding of any dietary and fluid restriction related to dialysis.   Interventions:  . Discussed current fluid intake with patient and caregiver . Assessed knowledge of any dietary restrictions or instructions . Collaborate with Dr Lowanda Foster about any fluid and dietary restrictions related to dialysis . Educate patient and/or caregiver on diet and fluid intake insturctions  Patient Self Care Activities:  . Prepares own quick meals, mostly in the microwave . Aware of current fluid volume intake    . "I need help with getting and fixing my food" (pt-stated)       Nurse Case Manager Clinical Goal(s): In the next 30 days patient and caregiver/POA will verbalize understanding of food resources.  Interventions:   Collaborate with [facility] program: Bonnetta Barry [phone]  Assessment of patient self-care abilities and patient nutrition status  Interview with family caregiver regarding available support and perceived needs  Patient Self Care Activities:  Patient reports being able to warm up meals in the microwave but also has some confusion about all of the microwave functions    . "I need help with transportation to dialysis and other appointments" (pt-stated)       Nurse Case Manager Clinical Goal(s): In the next 30 days patient and caregiver/POA will verbalize understanding of resources for transportation.  Interventions:   Collaborate with [place]  Patient Self Care Activities:   Joslyn Hy and daughter-in-law provide all of his transportation at this time    . "I want to make sure that I'm keeping up with my blood pressure" (pt-stated)       Nurse Case Manager Clinical Goal(s): Within the next 30 days patient and  caregiver will verbalize understanding of need to monitor and record blood pressure readings regularly  Interventions:  . Encouraged to continue checking blood pressure at home . Record blood pressure readings in the blue book that I provided and take it with you to all appointments . Call our office with any blood pressure readings that are below 100/50 or higher than 140/90. . Print and provide EMMI handouts on blood pressure measurement and management  Patient Self Care Activities:  . Checks blood pressure on home monitor . Takes medication to manage hypertension    . "I'm worried about falling at home when I'm alone and not being able to get to the telephone" (pt-stated)       Nurse Case Manager Clinical Goal(s): In the next 30 days patient and caregiver will verbalize understanding of ways to decrease his risk for falls in the home and will verbalize understanding of available medical alert systems.  Interventions:   Falls assessment performed  Education provided to patient and caregiver on fall prevention  Contacted [place] Siren with [facility] [phone]) to arrange setup of a home medical alert system  Print and provide EMMI handouts on fall prevention    Patient Self Care Activities:  Uses a cane  Aware of movement limitations and what increases his personal risk for falls. He tries to make a conscious effort to change positions slowly.     . Caregiver stated "I need more understanding of his dementia diagnosis. We were told that he has a "touch of dementia." (pt-stated)       Nurse Case  Manager Clinical Goal(s): In the next 30 days patient and caregiver will verbalize understanding of dementia diagnosis and management strategies.   Interventions:   Addressed general questions regarding dementia diagnosis  Addressed impact of dementia and progression on general health and self care capability.   Patient Self Care Activities:   Patients lives alone and is  able to perform most ADLs on his own at this point. Patient verbalized that he is starting to have some difficulty with meal preparation and using his microwave   Caregiver stated that he will review Mr Sen's microwave with him and they can purchase a more user friendly one if necessary  Caregiver will continue to provide assistance as needed and check in on patient regularly        Education or Materials Provided:  . EMMI education materials: fall risk reduction and home blood pressure monitoring . Provided verbal education about the importance of monitoring blood pressure at home and keeping a log . Provided verbal education how to decrease your risk of falling at home  Mr. Vineyard was given information about Chronic Care Management services today including:  1. CCM service includes personalized support from designated clinical staff supervised by his physician, including individualized plan of care and coordination with other care providers 2. 24/7 contact phone numbers for assistance for urgent and routine care needs. 3. Service will only be billed when office clinical staff spend 20 minutes or more in a month to coordinate care. 4. Only one practitioner may furnish and bill the service in a calendar month. 5. The patient may stop CCM services at any time (effective at the end of the month) by phone call to the office staff. 6. The patient will be responsible for cost sharing (co-pay) of up to 20% of the service fee (after annual deductible is met).  Patient agreed to services and verbal consent obtained.   The patient verbalized understanding of instructions provided today and declined a print copy of patient instruction materials.   [name], RN-BC, BSN Nurse Case Manager [facility] Ph: [phone]

## 2018-11-18 NOTE — Chronic Care Management (AMB) (Signed)
Chronic Care Management   Initial Visit Note  11/18/2018 Name: Don [name] MRN: [id number] DOB: 1941/06/03  Referred by: Dettinger, Fransisca Kaufmann, MD Reason for referral : Chronic Care Management (Initial CCM contact (In person))   Subjective: Patient stated that he would like assistance with long-term health management: including transportation (dialysis), falls prevention, blood pressure management, food resources and specialty diet (dialysis), and assistance with ADLs and caregiving resources.   Objective:  BP Readings from Last 3 Encounters:  11/18/18 (!) 106/56  10/31/18 128/82  10/29/18 127/90   Lab Results  Component Value Date   CREATININE 4.09 (H) 10/30/2018   CREATININE 3.47 (H) 10/29/2018   CREATININE 3.82 (H) 10/27/2018   BMI Readings from Last 3 Encounters:  11/18/18 22.68 kg/m  10/31/18 21.42 kg/m  10/08/18 22.89 kg/m    Number of ED visits/Hospital Admissions in previous 12 months: 4 ED visits with 2 admissions   Assessment:  Patient is a 78 year old male primary care patient of Dr Dettinger who was referred for CCM during his office visit with Dr Dettinger who spoke with me in person regarding [name]'s chronic care management needs. He was accompanied by his Jamelle Haring, who is also his caregiver and POA. [name] has many chronic medical conditions and would benefit from and has asked for assistance through chronic care management services.   Review of patient status, including review of consultants reports, relevant laboratory and other test results, and collaboration with appropriate care team members and the patient's provider was performed as part of comprehensive patient evaluation and provision of chronic care management services.     Goals Addressed     "I don't know if I have a special diet for dialysis" (pt-stated)       When asked if he had a special diet for dialysis or any fluid restrictions, [name] stated that he did not. His  caregiver, however, thinks differently.   Nurse Case Manager Clinical Goal(s): Within the next 30 days patient and/or caregiver will verbalize understanding of any dietary and fluid restriction related to dialysis.   Interventions:  . Discussed current fluid intake with patient and caregiver . Assessed knowledge of any dietary restrictions or instructions . Collaborate with Dr Lowanda Foster about any fluid and dietary restrictions related to dialysis . Educate patient and/or caregiver on diet and fluid intake insturctions  Patient Self Care Activities:  . Prepares own quick meals, mostly in the microwave . Aware of current fluid volume intake     "I need help with getting and fixing my food" (pt-stated)       I spoke with his caregiver and he said that at a recent visit [name] stated that he had difficulty with obtaining food. Caregiver says that they provide food for him and that he is concerned that he is not able to properly prepare all of his own meals.   Nurse Case Manager Clinical Goal(s): In the next 30 days patient and caregiver/POA will verbalize understanding of food resources.  Interventions:   Collaborate with [facility] program: Bonnetta Barry [phone]  Assessment of patient self-care abilities and patient nutrition status  Interview with family caregiver regarding available support and perceived needs  Patient Self Care Activities:  Patient reports being able to warm up meals in the microwave but also has some confusion about all of the microwave functions     "I need help with transportation to dialysis and other appointments" (pt-stated)  Patient stated that his stepson and his wife are reliable transportation but that he would like to free them up from that responsibility 3 days a week.   Nurse Case Manager Clinical Goal(s): In the next 30 days patient and caregiver/POA will verbalize understanding of resources for  transportation.  Interventions:   Collaborate with [place]  Patient Self Care Activities:   Joslyn Hy and daughter-in-law provide all of his transportation at this time     "I want to make sure that I'm keeping up with my blood pressure" (pt-stated)       Patient has a blood pressure monitor at home and checks his blood pressure on occasion. He does not document the findings.   Nurse Case Manager Clinical Goal(s): Within the next 30 days patient and caregiver will verbalize understanding of need to monitor and record blood pressure readings regularly  Interventions:  . Encouraged to continue checking blood pressure at home . Record blood pressure readings in the blue book that I provided and take it with you to all appointments . Call our office with any blood pressure readings that are below 100/50 or higher than 140/90.  Print and provide EMMI handouts on blood pressure measurement and management  Patient Self Care Activities:  . Checks blood pressure on home monitor . Takes medication to manage hypertension    . "I'm worried about falling at home when I'm alone and not being able to get to the telephone" (pt-stated)       Patient states that he knows he is at risk for falls. He is accustomed to moving quickly and has noticed that when he tries to now, he looses his balance. He carries a cane with him but does not always use it. He also explained the difficulty he is having putting on his pants while standing.   Nurse Case Manager Clinical Goal(s): In the next 30 days patient and caregiver will verbalize understanding of ways to decrease his risk for falls in the home and will verbalize understanding of available medical alert systems.  Interventions:   Falls assessment performed  Education provided to patient and caregiver on fall prevention  Contacted Houston Siren with [place] [phone] to arrange setup of a home medical alert  system  Print and provide EMMI handouts on fall prevention    Patient Self Care Activities:  Uses a cane  Aware of movement limitations and what increases his personal risk for falls. He tries to make a conscious effort to change positions slowly.      Caregiver stated "I need more understanding of his dementia diagnosis. We were told that he has a "touch of dementia." (pt-stated)       Caregiver stated that he has been told Mr Falconi has a "touch of dementia" but he isn't sure exactly what that means or how it will affect his self care.   Nurse Case Manager Clinical Goal(s): In the next 30 days patient and caregiver will verbalize understanding of dementia diagnosis and management strategies.   Interventions:   Addressed general questions regarding dementia diagnosis  Addressed impact of dementia and progression on general health and self care capability.   Patient Self Care Activities:   Patients lives alone and is able to perform most ADLs on his own at this point. Patient verbalized that he is starting to have some difficulty with meal preparation and using his microwave   Caregiver stated that he will review Mr Farino's microwave with him and  they can purchase a more user friendly one if necessary  Caregiver will continue to provide assistance as needed and check in on patient regularly       Plan:  I will call Mr Line and his caregiver, [name], within 7 business days and continue to follow Mr Hoopes for ongoing chronic care management services.    Mr. Herne was given information about Chronic Care Management services today including:  1. CCM service includes personalized support from designated clinical staff supervised by his physician, including individualized plan of care and coordination with other care providers 2. 24/7 contact phone numbers for assistance for urgent and routine care needs. 3. Service will only be billed when office clinical staff spend  20 minutes or more in a month to coordinate care. 4. Only one practitioner may furnish and bill the service in a calendar month. 5. The patient may stop CCM services at any time (effective at the end of the month) by phone call to the office staff. 6. The patient will be responsible for cost sharing (co-pay) of up to 20% of the service fee (after annual deductible is met).  Patient agreed to services and verbal consent obtained.  [name], RN-BC, BSN Nurse Case Manager [facility] Ph: [phone]

## 2018-11-19 DIAGNOSIS — Z992 Dependence on renal dialysis: Secondary | ICD-10-CM | POA: Diagnosis not present

## 2018-11-19 DIAGNOSIS — N186 End stage renal disease: Secondary | ICD-10-CM | POA: Diagnosis not present

## 2018-11-19 DIAGNOSIS — Z23 Encounter for immunization: Secondary | ICD-10-CM | POA: Diagnosis not present

## 2018-11-19 LAB — LIPID PANEL
Chol/HDL Ratio: 2 ratio (ref 0.0–5.0)
Cholesterol, Total: 98 mg/dL — ABNORMAL LOW (ref 100–199)
HDL: 50 mg/dL (ref >39)
LDL Calculated: 40 mg/dL (ref 0–99)
Triglycerides: 38 mg/dL (ref 0–149)
VLDL Cholesterol Cal: 8 mg/dL (ref 5–40)

## 2018-11-19 LAB — CBC WITH DIFFERENTIAL/PLATELET
BASOS: 1 %
Basophils Absolute: 0 10*3/uL (ref 0.0–0.2)
EOS (ABSOLUTE): 0.2 10*3/uL (ref 0.0–0.4)
Eos: 3 %
HEMOGLOBIN: 8.7 g/dL — AB (ref 13.0–17.7)
Hematocrit: 26.4 % — ABNORMAL LOW (ref 37.5–51.0)
Immature Grans (Abs): 0 10*3/uL (ref 0.0–0.1)
Immature Granulocytes: 0 %
Lymphocytes Absolute: 1.5 10*3/uL (ref 0.7–3.1)
Lymphs: 26 %
MCH: 32.2 pg (ref 26.6–33.0)
MCHC: 33 g/dL (ref 31.5–35.7)
MCV: 98 fL — ABNORMAL HIGH (ref 79–97)
Monocytes Absolute: 0.5 10*3/uL (ref 0.1–0.9)
Monocytes: 9 %
NEUTROS ABS: 3.6 10*3/uL (ref 1.4–7.0)
Neutrophils: 61 %
Platelets: 196 10*3/uL (ref 150–450)
RBC: 2.7 x10E6/uL — CL (ref 4.14–5.80)
RDW: 14.5 % (ref 11.6–15.4)
WBC: 5.8 10*3/uL (ref 3.4–10.8)

## 2018-11-19 LAB — CMP14+EGFR
ALT: 10 IU/L (ref 0–44)
AST: 14 IU/L (ref 0–40)
Albumin/Globulin Ratio: 1.9 (ref 1.2–2.2)
Albumin: 3.2 g/dL — ABNORMAL LOW (ref 3.5–4.8)
Alkaline Phosphatase: 102 IU/L (ref 39–117)
BUN/Creatinine Ratio: 8 — ABNORMAL LOW (ref 10–24)
BUN: 30 mg/dL — ABNORMAL HIGH (ref 8–27)
Bilirubin Total: 0.3 mg/dL (ref 0.0–1.2)
CO2: 25 mmol/L (ref 20–29)
Calcium: 8.5 mg/dL — ABNORMAL LOW (ref 8.6–10.2)
Chloride: 105 mmol/L (ref 96–106)
Creatinine, Ser: 3.54 mg/dL (ref 0.76–1.27)
GFR calc Af Amer: 18 mL/min/{1.73_m2} — ABNORMAL LOW (ref >59)
GFR calc non Af Amer: 15 mL/min/{1.73_m2} — ABNORMAL LOW (ref >59)
GLOBULIN, TOTAL: 1.7 g/dL (ref 1.5–4.5)
Glucose: 112 mg/dL — ABNORMAL HIGH (ref 65–99)
Potassium: 4 mmol/L (ref 3.5–5.2)
SODIUM: 145 mmol/L — AB (ref 134–144)
Total Protein: 4.9 g/dL — ABNORMAL LOW (ref 6.0–8.5)

## 2018-11-20 ENCOUNTER — Ambulatory Visit: Payer: Self-pay

## 2018-11-21 ENCOUNTER — Ambulatory Visit: Payer: Self-pay

## 2018-11-21 ENCOUNTER — Telehealth: Payer: Self-pay | Admitting: *Deleted

## 2018-11-21 ENCOUNTER — Ambulatory Visit: Payer: Self-pay | Admitting: *Deleted

## 2018-11-21 ENCOUNTER — Other Ambulatory Visit: Payer: Self-pay

## 2018-11-21 DIAGNOSIS — N186 End stage renal disease: Secondary | ICD-10-CM | POA: Diagnosis not present

## 2018-11-21 DIAGNOSIS — W19XXXD Unspecified fall, subsequent encounter: Secondary | ICD-10-CM

## 2018-11-21 DIAGNOSIS — Z23 Encounter for immunization: Secondary | ICD-10-CM | POA: Diagnosis not present

## 2018-11-21 DIAGNOSIS — Z992 Dependence on renal dialysis: Secondary | ICD-10-CM | POA: Diagnosis not present

## 2018-11-21 DIAGNOSIS — I10 Essential (primary) hypertension: Secondary | ICD-10-CM

## 2018-11-21 NOTE — Patient Outreach (Signed)
[facility]) Care Management  11/21/2018  KAHIL AGNER 1941-06-10 [id number]   Referral Date:11/14/2018 Referral Source:[employer] Report Date of Admission:unknown Diagnosis:unknown Date of Discharge:11/11/18 [place]  No further outreach needed for this patient as patient is being followed by primary care office for chronic care management.    Plan: RN CM will close case.    Jone Baseman, RN, MSN [place] Management Care Management Coordinator Direct Line [phone] Cell [phone] Toll Free: [phone]  Fax: [phone]

## 2018-11-23 ENCOUNTER — Ambulatory Visit (INDEPENDENT_AMBULATORY_CARE_PROVIDER_SITE_OTHER): Payer: Medicare HMO | Admitting: Physician Assistant

## 2018-11-23 VITALS — BP 111/57 | HR 100 | Temp 98.0°F | Ht 69.0 in | Wt 151.0 lb

## 2018-11-23 DIAGNOSIS — Z992 Dependence on renal dialysis: Secondary | ICD-10-CM | POA: Diagnosis not present

## 2018-11-23 DIAGNOSIS — N186 End stage renal disease: Secondary | ICD-10-CM

## 2018-11-23 DIAGNOSIS — Z23 Encounter for immunization: Secondary | ICD-10-CM | POA: Diagnosis not present

## 2018-11-23 DIAGNOSIS — E8809 Other disorders of plasma-protein metabolism, not elsewhere classified: Secondary | ICD-10-CM

## 2018-11-25 ENCOUNTER — Encounter: Payer: Self-pay | Admitting: Family Medicine

## 2018-11-25 ENCOUNTER — Ambulatory Visit (INDEPENDENT_AMBULATORY_CARE_PROVIDER_SITE_OTHER): Payer: Medicare HMO

## 2018-11-25 ENCOUNTER — Ambulatory Visit (INDEPENDENT_AMBULATORY_CARE_PROVIDER_SITE_OTHER): Payer: Medicare HMO | Admitting: Family Medicine

## 2018-11-25 ENCOUNTER — Ambulatory Visit: Payer: Self-pay | Admitting: Licensed Clinical Social Worker

## 2018-11-25 VITALS — BP 119/68 | HR 103 | Temp 97.6°F | Ht 69.0 in | Wt 149.0 lb

## 2018-11-25 DIAGNOSIS — R059 Cough, unspecified: Secondary | ICD-10-CM

## 2018-11-25 DIAGNOSIS — J441 Chronic obstructive pulmonary disease with (acute) exacerbation: Secondary | ICD-10-CM

## 2018-11-25 DIAGNOSIS — R05 Cough: Secondary | ICD-10-CM | POA: Diagnosis not present

## 2018-11-25 DIAGNOSIS — N186 End stage renal disease: Secondary | ICD-10-CM | POA: Diagnosis not present

## 2018-11-25 DIAGNOSIS — F339 Major depressive disorder, recurrent, unspecified: Secondary | ICD-10-CM

## 2018-11-25 DIAGNOSIS — I10 Essential (primary) hypertension: Secondary | ICD-10-CM | POA: Diagnosis not present

## 2018-11-25 MED ORDER — CEFDINIR 300 MG PO CAPS: 300.0000 mg | ORAL_CAPSULE | Freq: Every day | ORAL | 0 refills | Status: DC

## 2018-11-25 MED ORDER — PREDNISONE 20 MG PO TABS: 40.0000 mg | ORAL_TABLET | Freq: Every day | ORAL | 0 refills | Status: AC

## 2018-11-25 MED ORDER — FLUTICASONE PROPIONATE 50 MCG/ACT NA SUSP: 2.0000 | Freq: Every day | NASAL | 6 refills | Status: DC

## 2018-11-25 NOTE — Patient Instructions (Signed)
Visit Information  Goals we discussed today:  Goals Addressed    . "I need help with getting and fixing my food" (pt-stated)       Nurse Case Manager Clinical Goal(s): In the next 2 weeks, patient and/or caregiver will verbalize understanding of how to obtain and prepare meals.  Interventions:  1. Information given on the Medicare Well Dine Program [phone] 2. Discussed ability to use the microwave to heat up the packaged meals   Patient Self Care Activities: 1. Caregiver attested that Mr Yaffe is able to use his current microwave and that he is willing to purchase a new one for Mr Chui if at some point he is unable to use it properly  See "Past Updates" for all previously related goals and interventions.    [name] "I need help with transportation to dialysis and other appointments" (pt-stated)       Nurse Case Manager Clinical Goal(s): In the next 30 days patient and caregiver/POA will verbalize understanding of resources for transportation.  Interventions:  1. Recommended [facility] [place] [phone]) as a resource for transportation arrangement  2. Collaborated with Theadore Nan, LCSW who relayed that [facility] coverage includes a benefit that covers transportation costs to and from appointment six times a year.  Patient Self Care Activities:  1. [place] and daughter-in-law provide all of his transportation at this time    . "I'm worried about falling at home when I'm alone and not being able to get to the telephone" (pt-stated)       Nurse Case Manager Clinical Goal(s): In the next 30 days patient and caregiver will verbalize understanding of ways to decrease his risk for falls in the home and will verbalize understanding of available medical alert systems.  Interventions:  1.   Falls assessment performed 2. Education provided to patient and caregiver on fall prevention 3. Contacted Houston Siren with [place] [phone]) to arrange setup of a  home medical alert system 4. Print and provide EMMI handouts on fall prevention    Patient Self Care Activities: 1. Uses a cane 2. Aware of movement limitations and what increases his personal risk for falls. He tries to make a conscious effort to change positions slowly.   See "Past Updates" for previous updates related to this goal.         Education or Materials Provided:  1. Provided telephone number for [facility] in [place] [phone] 2. Information on [employer] Well Dine Program given as well as contact number to sign up for the service      [phone] 3. You will be contacted by Houston Siren [phone]) to discuss the [place] that she provides   The patient verbalized understanding of instructions provided today and declined a print copy of patient instruction materials.    [name], RN-BC, BSN Nurse Case Manager [facility] Ph: [phone]

## 2018-11-25 NOTE — Progress Notes (Signed)
Subjective: CC: cough PCP: Dettinger, Fransisca Kaufmann, MD Don [name] is a 78 y.o. male presenting to clinic today for:  1. Cough Patient reports onset of mildly productive cough Friday.  He notes associated headache, myalgia, rhinorrhea and nasal congestion.  He has had several episodes where he "lost his breath".  He describes this as times of tachypnea and difficulty taking a deep breath in.  He has a known history of COPD and reports compliance with Symbicort and use of albuterol twice daily as well as Spiriva once daily.  He quit smoking a few months ago.  Denies any hemoptysis, measured fevers.  He has not used any medications over-the-counter because he has ESRD on hemodialysis and was unsure as to what medicines he was able to use.   ROS: Per HPI  Allergies  Allergen Reactions  . Wellbutrin [Bupropion] Anxiety   Past Medical History:  Diagnosis Date  . Acute bronchitis 04/03/2014  . Anemia    low iron  . Anxiety   . Atrial fibrillation ([place])   . Cataract   . COPD (chronic obstructive pulmonary disease) ([place])   . Depression   . Essential hypertension   . Hyperlipidemia   . Macular degeneration   . Noncompliance with medications 04/2013   Xarelto, digoxin previously  . Pre-diabetes   . Prostate cancer ([place])   . 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 Tubular adenoma of colon 07/31/02, 11/18/03    Current Outpatient Medications:  .  acetaminophen (TYLENOL) 500 MG tablet, Take 500 mg by mouth every 6 (six) hours as needed for moderate pain. , Disp: , Rfl:  .  acitretin (SORIATANE) 10 MG capsule, Take 10 mg by mouth daily. , Disp: , Rfl:  .  albuterol (PROAIR HFA) 108 (90 Base) MCG/ACT inhaler, Inhale 2 puffs into the lungs 2 (two) times daily. , Disp: , Rfl:  .  apixaban (ELIQUIS) 5 MG TABS tablet, Take 1 tablet (5 mg total) by mouth 2 (two) times daily. (Needs to be seen) (Patient taking differently: Take 5 mg by mouth 2 (two) times daily. ), Disp: 60  tablet, Rfl: 5 .  atorvastatin (LIPITOR) 20 MG tablet, Take 1 tablet (20 mg total) by mouth daily. (Needs to be seen) (Patient taking differently: Take 20 mg by mouth at bedtime. ), Disp: 90 tablet, Rfl: 3 .  bisoprolol (ZEBETA) 10 MG tablet, TAKE (1) TABLET TWICE A DAY. (Patient taking differently: Take 10 mg by mouth every morning. ), Disp: 180 tablet, Rfl: 1 .  budesonide-formoterol (SYMBICORT) 160-4.5 MCG/ACT inhaler, Inhale 2 puffs into the lungs 2 (two) times daily., Disp: , Rfl:  .  calcitRIOL (ROCALTROL) 0.25 MCG capsule, Take 1 capsule (0.25 mcg total) by mouth every morning., Disp: 90 capsule, Rfl: 3 .  clonazePAM (KLONOPIN) 0.5 MG tablet, Take 1 tablet (0.5 mg total) by mouth at bedtime., Disp: 10 tablet, Rfl: 0 .  diltiazem (CARDIZEM CD) 240 MG 24 hr capsule, TAKE (1) CAPSULE DAILY (Patient taking differently: Take 240 mg by mouth every morning. ), Disp: 90 capsule, Rfl: 3 .  ferrous sulfate 325 (65 FE) MG tablet, Take 325 mg by mouth every Monday, Wednesday, and Friday., Disp: , Rfl:  .  hydroxypropyl methylcellulose / hypromellose (ISOPTO TEARS / GONIOVISC) 2.5 % ophthalmic solution, Place 1 drop into both eyes daily as needed for dry eyes., Disp: , Rfl:  .  iron polysaccharides (FERREX 150) 150 MG capsule, Take 1 capsule (150 mg total) by  mouth daily. (Needs to be seen) (Patient taking differently: Take 150 mg by mouth every morning. (Needs to be seen)), Disp: 90 capsule, Rfl: 3 .  loratadine (CLARITIN) 10 MG tablet, TAKE 1 TABLET DAILY (Patient taking differently: Take 10 mg by mouth at bedtime. ), Disp: 30 tablet, Rfl: 2 .  Melatonin 3 MG TABS, Take 3 mg by mouth at bedtime. , Disp: , Rfl:  .  Multiple Vitamin (MULTIVITAMIN WITH MINERALS) TABS tablet, Take 1 tablet by mouth at bedtime. , Disp: , Rfl:  .  Multiple Vitamins-Minerals (PRESERVISION AREDS 2 PO), Take 1 tablet by mouth 2 (two) times daily. , Disp: , Rfl:  .  multivitamin (RENA-VIT) TABS tablet, Take 1 tablet by mouth at  bedtime., Disp: 30 tablet, Rfl: 0 .  nicotine (NICODERM CQ - DOSED IN MG/24 HOURS) 14 mg/24hr patch, Place 1 patch (14 mg total) onto the skin daily., Disp: 28 patch, Rfl: 2 .  Nutritional Supplements (FEEDING SUPPLEMENT, NEPRO CARB STEADY,) LIQD, Take 237 mLs by mouth 2 (two) times daily between meals., Disp: 60 Can, Rfl: 0 .  omega-3 acid ethyl esters (LOVAZA) 1 g capsule, Take 2 g by mouth 2 (two) times daily., Disp: , Rfl:  .  pantoprazole (PROTONIX) 40 MG tablet, TAKE (1) TABLET TWICE A DAY. (Patient taking differently: Take 40 mg by mouth 2 (two) times daily. ), Disp: 180 tablet, Rfl: 1 .  terazosin (HYTRIN) 2 MG capsule, TAKE 1 CAPSULE AT BEDTIME (Patient taking differently: Take 2 mg by mouth at bedtime. ), Disp: 90 capsule, Rfl: 0 .  tiotropium (SPIRIVA) 18 MCG inhalation capsule, Place 18 mcg into inhaler and inhale daily as needed (for shortness of breath). , Disp: , Rfl:  Social History   Socioeconomic History  . Marital status: Married    Spouse name: Not on file  . Number of children: 0  . Years of education: Not on file  . Highest education level: Not on file  Occupational History  . Occupation: retired  Scientific laboratory technician  . Financial resource strain: Not on file  . Food insecurity:    Worry: Not on file    Inability: Not on file  . Transportation needs:    Medical: Not on file    Non-medical: Not on file  Tobacco Use  . Smoking status: Current Some Day Smoker    Packs/day: 0.50    Years: 62.00    Pack years: 31.00    Types: Cigarettes, Cigars    Last attempt to quit: 12/28/2016    Years since quitting: 1.9  . Smokeless tobacco: Never Used  . Tobacco comment: "very little"  Substance and Sexual Activity  . Alcohol use: No  . Drug use: No  . Sexual activity: Never  Lifestyle  . Physical activity:    Days per week: 0 days    Minutes per session: 0 min  . Stress: Not at all  Relationships  . Social connections:    Talks on phone: Never    Gets together: Once a  week    Attends religious service: Never    Active member of club or organization: No    Attends meetings of clubs or organizations: Never    Relationship status: Widowed  . Intimate partner violence:    Fear of current or ex partner: Not on file    Emotionally abused: Not on file    Physically abused: Not on file    Forced sexual activity: Not on file  Other Topics Concern  .  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     Family History  Problem Relation Age of Onset  . Diabetes Father   . Heart disease Father 56       MI  . Heart attack Father   . Heart attack Mother   . Heart disease Brother   . Cancer Brother        lung  . Lung cancer Brother   . Heart disease Brother   . Lung cancer Sister   . Cancer Sister        lung  . Heart disease Brother   . Heart disease Brother     Objective: Office vital signs reviewed. BP 119/68   Pulse (!) 103   Temp 97.6 F (36.4 C) (Oral)   Ht 5\' 9"  (1.753 m)   Wt 149 lb (67.6 kg)   SpO2 98%   BMI 22.00 kg/m   Physical Examination:  General: Awake, alert, nontoxic, No acute distress HEENT: Normal    Neck: No masses palpated. No lymphadenopathy    Eyes: PERRLA, extraocular membranes intact, sclera white    Nose: nasal turbinates moist, clear nasal discharge    Throat: moist mucus membranes, no erythema, no tonsillar exudate.  Airway is patent Cardio: regular rate and rhythm, S1S2 heard, no murmurs appreciated Pulm: Global expiratory wheeze with good air movement.  He also has mild bibasilar crackles.  No rhonchi; normal work of breathing on room air Extremities: warm, well perfused, trace pedal edema. No cyanosis or clubbing; +2 pulses bilaterally  No results found.  Assessment/ Plan: 78 y.o. male   1. COPD with acute exacerbation ([place]) Patient is afebrile and nontoxic-appearing.  He has normal work of breathing and normal oxygen saturation on  room air.  His physical exam was notable for global expiratory wheezes with good air movement and mild bibasilar crackles.  Chest x-ray was obtained and upon personal review he does have some blunting of the left costophrenic angle.  No discrete pulmonary infiltrate suggestive of pneumonia appreciated.  I am awaiting formal review by radiology.  We will proceed with treatment for COPD exacerbation with prednisone and Omnicef 300 mg daily, this is renally dosed for ESRD on HD.  Have instructed him to take his daily dose after hemodialysis.  Doxycycline was considered but apparently this medication would interact with his Soriatane and was not prescribed.  I have encouraged him to use his albuterol inhaler every 6 hours scheduled for the next 2 days and as needed as directed.  Okay to use Mucinex if needed for cough.  Flonase also prescribed for nasal symptoms.  Reasons for return and emergent evaluation emergency department discussed.  He voiced good understanding will follow-up PRN.  2. Cough - DG Chest 2 View; Future   Orders Placed This Encounter  Procedures  . DG Chest 2 View    Standing Status:   Future    Standing Expiration Date:   01/24/2020    Order Specific Question:   Reason for Exam (SYMPTOM  OR DIAGNOSIS REQUIRED)    Answer:   cough    Order Specific Question:   Preferred imaging location?    Answer:   Internal    Order Specific Question:   Radiology Contrast Protocol - do NOT remove file path    Answer:   [email or web address]   Meds ordered this encounter  Medications  . predniSONE (DELTASONE) 20 MG  tablet    Sig: Take 2 tablets (40 mg total) by mouth daily with breakfast for 5 days.    Dispense:  10 tablet    Refill:  0  . fluticasone (FLONASE) 50 MCG/ACT nasal spray    Sig: Place 2 sprays into both nostrils daily.    Dispense:  16 g    Refill:  6  . cefdinir (OMNICEF) 300 MG capsule    Sig: Take 1 capsule (300 mg total) by mouth daily.  (take AFTER hemodialysis on HD days)    Dispense:  7 capsule    Refill:  0     Avery Eustice Windell Moulding, DO [place] [phone]

## 2018-11-25 NOTE — Chronic Care Management (AMB) (Signed)
Chronic Care Management   Follow Up Note   11/21/2018 Name: Don [name] MRN: [id number] DOB: 1940-12-25  Referred by: Dettinger, Fransisca Kaufmann, MD Reason for referral : Chronic Care Management (Telephone F/U re: community resources)  Subjective: Telephone call to follow-up meal resources, transportation, and medical alert system.   Objective: BMI Readings from Last 3 Encounters:  11/25/18 22.00 kg/m  11/23/18 22.30 kg/m  11/18/18 22.68 kg/m   Fall Risk  11/18/2018 08/16/2018 07/31/2018 04/11/2018 04/04/2018  Falls in the past year? 1 Yes Yes Yes Yes  Number falls in past yr: 1 2 or more 2 or more 2 or more 2 or more  Injury with Fall? 1 Yes Yes Yes Yes  Comment - - laceration to right arm laceration to right arm leg pain, wrist pain  Risk for fall due to : - - - - -  Follow up - - - - -    Assessment: Don [name] is a 78 year old primary care patient of Don [name] that was is being followed for CCM due to hypertension, ESRD with dialysis, falls, and memory difficulties.   Goals Addressed    . "I need help with getting and fixing my food" (pt-stated)       Nurse Case Manager Clinical Goal(s): In the next 2 weeks, patient and/or caregiver will verbalize understanding of how to obtain and prepare meals.  Interventions:  1. Information given on the Medicare Well Dine Program [phone] 2. Discussed ability to use the microwave to heat up the packaged meals   Patient Self Care Activities: 1. Caregiver attested that Don [name] is able to use his current microwave and that he is willing to purchase a new one for Don [name] if at some point he is unable to use it properly  See "Past Updates" for all previously related goals and interventions.    [name] "I need help with transportation to dialysis and other appointments" (pt-stated)       Nurse Case Manager Clinical Goal(s): In the next 30 days patient and caregiver/POA will verbalize understanding of resources for  transportation.  Interventions:  1. Recommended [facility] [place] [phone]) as a resource for transportation arrangement  2. Collaborated with Theadore Nan, LCSW who relayed that [facility] coverage includes a benefit that covers transportation costs to and from appointment six times a year.  Patient Self Care Activities:  1. [place] and daughter-in-law provide all of his transportation at this time    . "I'm worried about falling at home when I'm alone and not being able to get to the telephone" (pt-stated)       Nurse Case Manager Clinical Goal(s): In the next 30 days patient and caregiver will verbalize understanding of ways to decrease his risk for falls in the home and will verbalize understanding of available medical alert systems.  Interventions:  1. Falls assessment performed 2. Education provided to patient and caregiver on fall prevention 3. Contacted [place] Siren with [facility] [phone]) to arrange setup of a home medical alert system 4    Print and provide EMMI handouts on fall prevention    Patient Self Care Activities: 1. Uses a cane 2. Aware of movement limitations and what increases his personal risk for falls. He tries to make a conscious effort to change positions slowly.   See "Past Updates" for previous updates related to this goal.      Plan:  Will follow up by telephone within 2 weeks to discuss the status  of home meal deliveries, transportation arrangements, and the medical alert system. Ongoing collaboration to provide CCM needed services to Don [name].     [name], RN-BC, BSN Nurse Case Manager [facility] Ph: [phone]

## 2018-11-25 NOTE — Patient Instructions (Signed)
Licensed Clinical Social Worker Visit Information   Goals Addressed            This Visit's Progress   . "I get sad sometimes because I live alone"       Current Barriers:   Client has transportation needs . Client has some ambulation challenges  Clinical Social Work Clinical Goal(s): Over the next 30 days, client will verbalize understanding of depression symptoms management.  Interventions: . LCSW provided counseling support services to client as follows: . Assessed patient's current psychosocial needs . Discussed self care activities: sleep hygiene, basic healthy eating practices . Assessed patient's ability to pay for and self administer medications (receiving delivered pre-filled bubble packs)  . encouraged client to participate in recreational/relaxation activities of choice  Patient Self Care Activities:  . Client socializes with family weekly as scheduled.  . Client contacts [name], step-son, POA, as needed for daily needs/assistance of client. . Client takes medications as prescribed.  Plan:  . LCSW to contact client/POA [name]) in two weeks to discuss client needs related to depression management.  [name] [facility]) to contact LCSW related to psychosocial needs of client .  Client to use relaxation techniques of choice to help him manage depression symptoms.       The patient verbalized understanding of instructions provided today and declined a print copy of patient instruction materials.   Plan: (choose one) LCSW will reach out to patient by phone in two weeks to discuss psychosocial needs of client.  [name].Eveleigh Crumpler MSW, LCSW Licensed Clinical Social Worker [facility] Care Management [phone]

## 2018-11-25 NOTE — Patient Instructions (Addendum)
I think that you are having a flare of your COPD.  I listen to your lungs and you have wheezes everywhere despite use of your inhalers.  I am putting you on a course of steroids and antibiotics to help with this.  I would like you to use your albuterol inhaler 2 puffs every 6 hours scheduled for the next 2 days then you can go back to using it as you normally do.  Prednisone: take 2 tablets daily for 5 days.  Cefdinir: take 1 capsule ONCE daily for 7 days (take after Hemodialysis on HD days)  Albuterol: Use 2 puffs every 6 hours for the next 2 days.  Then as needed as directed.  You may use Mucinex DM for cough and congestion.   I have prescribed Flonase to use for nasal symptoms.   Chronic Obstructive Pulmonary Disease Exacerbation Chronic obstructive pulmonary disease (COPD) is a long-term (chronic) lung problem. In COPD, the flow of air from the lungs is limited. COPD exacerbations are times that breathing gets worse and you need more than your normal treatment. Without treatment, they can be life threatening. If they happen often, your lungs can become more damaged. If your COPD gets worse, your doctor may treat you with:  Medicines.  Oxygen.  Different ways to clear your airway, such as using a mask. Follow these instructions at home: Medicines  Take over-the-counter and prescription medicines only as told by your doctor.  If you take an antibiotic or steroid medicine, do not stop taking the medicine even if you start to feel better.  Keep up with shots (vaccinations) as told by your doctor. Be sure to get a yearly (annual) flu shot. Lifestyle  Do not smoke. If you need help quitting, ask your doctor.  Eat healthy foods.  Exercise regularly.  Get plenty of sleep.  Avoid tobacco smoke and other things that can bother your lungs.  Wash your hands often with soap and water. This will help keep you from getting an infection. If you cannot use soap and water, use hand  sanitizer.  During flu season, avoid areas that are crowded with people. General instructions  Drink enough fluid to keep your pee (urine) clear or pale yellow. Do not do this if your doctor has told you not to.  Use a cool mist machine (vaporizer).  If you use oxygen or a machine that turns medicine into a mist (nebulizer), continue to use it as told.  Follow all instructions for rehabilitation. These are steps you can take to make your body work better.  Keep all follow-up visits as told by your doctor. This is important. Contact a doctor if:  Your COPD symptoms get worse than normal. Get help right away if:  You are short of breath and it gets worse.  You have trouble talking.  You have chest pain.  You cough up blood.  You have a fever.  You keep throwing up (vomiting).  You feel weak or you pass out (faint).  You feel confused.  You are not able to sleep because of your symptoms.  You are not able to do daily activities. Summary  COPD exacerbations are times that breathing gets worse and you need more treatment than normal.  COPD exacerbations can be very serious and may cause your lungs to become more damaged.  Do not smoke. If you need help quitting, ask your doctor.  Stay up-to-date on your shots. Get a flu shot every year. This information is not  intended to replace advice given to you by your health care provider. Make sure you discuss any questions you have with your health care provider. Document Released: 10/19/2011 Document Revised: 12/04/2016 Document Reviewed: 12/04/2016 Elsevier Interactive Patient Education  2019 [employer].

## 2018-11-25 NOTE — Chronic Care Management (AMB) (Signed)
  Chronic Care Management    Clinical Social Work General Note  11/25/2018 Name: Don [name] MRN: [id number] DOB: [birth year]   Referred by: Dr. Warrick Parisian for assessment of psychosocial needs and support.   Review of patient status, including review of consultants reports, relevant laboratory and other test results, and collaboration with appropriate care team members and the patient's provider was performed as part of comprehensive patient evaluation and provision of chronic care management services.    Last CCM Appointment: 11/25/2018   Office Visit from 04/04/2018 in [place]  PHQ-9 Total Score  10     Goals/Interventions  Goals Addressed    . "I get sad sometimes because I live alone"       Current Barriers:   Client has transportation needs . Client has some ambulation challenges  Clinical Social Work Clinical Goal(s): Over the next 30 days, client will verbalize understanding of depression symptoms management.  Interventions: . LCSW provided counseling support services to client as follows: . Assessed patient's current psychosocial needs . Discussed self care activities: sleep hygiene, basic healthy eating practices . Assessed patient's ability to pay for and self administer medications (receiving delivered pre-filled bubble packs)  . encouraged client to participate in recreational/relaxation activities of choice  Patient Self Care Activities:  . Client socializes with family weekly as scheduled.  . Client contacts [name], step-son, POA, as needed for daily needs/assistance of client. . Client takes medications as prescribed.  Plan:  . LCSW to contact client/POA [name]) in two weeks to discuss client needs related to depression management.  [name] [facility]) to contact LCSW related to psychosocial needs of client . [name] Client to use relaxation techniques of choice to help him manage depression symptoms.        Follow Up Plan:    I will follow up with client or his [place] by phone over the next 2 weeks.   Mr. Keast or his POA [name] will reach out to me by phone if needed prior to my call.       [name].Ravis Herne MSW, LCSW Licensed Clinical Social Worker [facility] [phone]

## 2018-11-25 NOTE — Progress Notes (Signed)
BP (!) 111/57   Pulse 100   Temp 98 F (36.7 C) (Oral)   Ht '5\' 9"'$  (1.753 m)   Wt 151 lb (68.5 kg)   BMI 22.30 kg/m    Subjective:    Patient ID: Don [name], male    DOB: 06-19-1941, 78 y.o.   MRN: [id number]  HPI: Don [name] is a 78 y.o. male presenting on 11/23/2018 for Nausea (pt here today c/o nausea and cough yesterday morning but since then has felt better)  Patient is accompanied by his son today.  He has had a little bit of nausea.  He has been having some cough but it was better as of Saturday.  They deny any fever or chills.  He has not had any productive cough.  He is currently on dialysis he goes 3 times a week.  He is scheduled to go later in the afternoon.  All of his medications are reviewed.  We have had a long discussion about ways for him to increase protein in his diet.  He does have Ensure type drinks at home and I have encouraged him to try to get to twice a day on this.  [facility] does [facility] these to him.  He does like to eat eggs.  I encouraged him to continue to do that.  Past Medical History:  Diagnosis Date  . Acute bronchitis 04/03/2014  . Anemia    low iron  . Anxiety   . Atrial fibrillation ([place])   . Cataract   . COPD (chronic obstructive pulmonary disease) ([place])   . Depression   . Essential hypertension   . Hyperlipidemia   . Macular degeneration   . Noncompliance with medications 04/2013   Xarelto, digoxin previously  . Pre-diabetes   . Prostate cancer ([place])   . 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 Tubular adenoma of colon 07/31/02, 11/18/03   Relevant past medical, surgical, family and social history reviewed and updated as indicated. Interim medical history since our last visit reviewed. Allergies and medications reviewed and updated. DATA REVIEWED: CHART IN EPIC  Family History reviewed for pertinent findings.  Review of Systems  Constitutional: Positive for fatigue. Negative for appetite change and fever.  HENT:  Negative.  Negative for congestion.   Eyes: Negative.  Negative for pain and visual disturbance.  Respiratory: Negative.  Negative for cough, chest tightness, shortness of breath and wheezing.   Cardiovascular: Negative.  Negative for chest pain, palpitations and leg swelling.  Gastrointestinal: Positive for nausea. Negative for abdominal pain, diarrhea, rectal pain and vomiting.  Endocrine: Negative.   Genitourinary: Negative.   Musculoskeletal: Negative.   Skin: Negative.  Negative for color change and rash.  Neurological: Negative.  Negative for weakness, numbness and headaches.  Psychiatric/Behavioral: Negative.     Allergies as of 11/23/2018      Reactions   Wellbutrin [bupropion] Anxiety      Medication List       Accurate as of November 23, 2018 11:59 PM. Always use your most recent med list.        acetaminophen 500 MG tablet Commonly known as:  TYLENOL Take 500 mg by mouth every 6 (six) hours as needed for moderate pain.   acitretin 10 MG capsule Commonly known as:  SORIATANE Take 10 mg by mouth daily.   apixaban 5 MG Tabs tablet Commonly known as:  ELIQUIS Take 1 tablet (5 mg total) by mouth 2 (two) times daily. (  Needs to be seen)   atorvastatin 20 MG tablet Commonly known as:  LIPITOR Take 1 tablet (20 mg total) by mouth daily. (Needs to be seen)   bisoprolol 10 MG tablet Commonly known as:  ZEBETA TAKE (1) TABLET TWICE A DAY.   budesonide-formoterol 160-4.5 MCG/ACT inhaler Commonly known as:  SYMBICORT Inhale 2 puffs into the lungs 2 (two) times daily.   calcitRIOL 0.25 MCG capsule Commonly known as:  ROCALTROL Take 1 capsule (0.25 mcg total) by mouth every morning.   clonazePAM 0.5 MG tablet Commonly known as:  KLONOPIN Take 1 tablet (0.5 mg total) by mouth at bedtime.   diltiazem 240 MG 24 hr capsule Commonly known as:  CARDIZEM CD TAKE (1) CAPSULE DAILY   feeding supplement (NEPRO CARB STEADY) Liqd Take 237 mLs by mouth 2 (two) times daily  between meals.   ferrous sulfate 325 (65 FE) MG tablet Take 325 mg by mouth every Monday, Wednesday, and Friday.   hydroxypropyl methylcellulose / hypromellose 2.5 % ophthalmic solution Commonly known as:  ISOPTO TEARS / GONIOVISC Place 1 drop into both eyes daily as needed for dry eyes.   iron polysaccharides 150 MG capsule Commonly known as:  FERREX 150 Take 1 capsule (150 mg total) by mouth daily. (Needs to be seen)   loratadine 10 MG tablet Commonly known as:  CLARITIN TAKE 1 TABLET DAILY   Melatonin 3 MG Tabs Take 3 mg by mouth at bedtime.   multivitamin Tabs tablet Take 1 tablet by mouth at bedtime.   multivitamin with minerals Tabs tablet Take 1 tablet by mouth at bedtime.   nicotine 14 mg/24hr patch Commonly known as:  NICODERM CQ - dosed in mg/24 hours Place 1 patch (14 mg total) onto the skin daily.   omega-3 acid ethyl esters 1 g capsule Commonly known as:  LOVAZA Take 2 g by mouth 2 (two) times daily.   pantoprazole 40 MG tablet Commonly known as:  PROTONIX TAKE (1) TABLET TWICE A DAY.   PRESERVISION AREDS 2 PO Take 1 tablet by mouth 2 (two) times daily.   PROAIR HFA 108 (90 Base) MCG/ACT inhaler Generic drug:  albuterol Inhale 2 puffs into the lungs 2 (two) times daily.   terazosin 2 MG capsule Commonly known as:  HYTRIN TAKE 1 CAPSULE AT BEDTIME   tiotropium 18 MCG inhalation capsule Commonly known as:  SPIRIVA Place 18 mcg into inhaler and inhale daily as needed (for shortness of breath).          Objective:    BP (!) 111/57   Pulse 100   Temp 98 F (36.7 C) (Oral)   Ht '5\' 9"'$  (1.753 m)   Wt 151 lb (68.5 kg)   BMI 22.30 kg/m   Allergies  Allergen Reactions  . Wellbutrin [Bupropion] Anxiety    Wt Readings from Last 3 Encounters:  11/25/18 149 lb (67.6 kg)  11/23/18 151 lb (68.5 kg)  11/18/18 153 lb 9.6 oz (69.7 kg)    Physical Exam Constitutional:      Appearance: He is well-developed.  HENT:     Head: Normocephalic and  atraumatic.  Eyes:     Conjunctiva/sclera: Conjunctivae normal.     Pupils: Pupils are equal, round, and reactive to light.  Neck:     Musculoskeletal: Normal range of motion and neck supple.  Cardiovascular:     Rate and Rhythm: Normal rate and regular rhythm.     Heart sounds: Normal heart sounds.  Pulmonary:  Effort: Pulmonary effort is normal.     Breath sounds: Normal breath sounds.  Abdominal:     General: Bowel sounds are normal.     Palpations: Abdomen is soft.  Musculoskeletal: Normal range of motion.  Skin:    General: Skin is warm and dry.     Results for orders placed or performed in visit on 11/18/18  Bayer DCA Hb A1c Waived  Result Value Ref Range   HB A1C (BAYER DCA - WAIVED) 4.9 <7.0 %  CBC with Differential/Platelet  Result Value Ref Range   WBC 5.8 3.4 - 10.8 x10E3/uL   RBC 2.70 (LL) 4.14 - 5.80 x10E6/uL   Hemoglobin 8.7 (LL) 13.0 - 17.7 g/dL   Hematocrit 26.4 (L) 37.5 - 51.0 %   MCV 98 (H) 79 - 97 fL   MCH 32.2 26.6 - 33.0 pg   MCHC 33.0 31.5 - 35.7 g/dL   RDW 14.5 11.6 - 15.4 %   Platelets 196 150 - 450 x10E3/uL   Neutrophils 61 Not Estab. %   Lymphs 26 Not Estab. %   Monocytes 9 Not Estab. %   Eos 3 Not Estab. %   Basos 1 Not Estab. %   Neutrophils Absolute 3.6 1.4 - 7.0 x10E3/uL   Lymphocytes Absolute 1.5 0.7 - 3.1 x10E3/uL   Monocytes Absolute 0.5 0.1 - 0.9 x10E3/uL   EOS (ABSOLUTE) 0.2 0.0 - 0.4 x10E3/uL   Basophils Absolute 0.0 0.0 - 0.2 x10E3/uL   Immature Granulocytes 0 Not Estab. %   Immature Grans (Abs) 0.0 0.0 - 0.1 x10E3/uL  CMP14+EGFR  Result Value Ref Range   Glucose 112 (H) 65 - 99 mg/dL   BUN 30 (H) 8 - 27 mg/dL   Creatinine, Ser 3.54 (HH) 0.76 - 1.27 mg/dL   GFR calc non Af Amer 15 (L) >59 mL/min/1.73   GFR calc Af Amer 18 (L) >59 mL/min/1.73   BUN/Creatinine Ratio 8 (L) 10 - 24   Sodium 145 (H) 134 - 144 mmol/L   Potassium 4.0 3.5 - 5.2 mmol/L   Chloride 105 96 - 106 mmol/L   CO2 25 20 - 29 mmol/L   Calcium 8.5 (L)  8.6 - 10.2 mg/dL   Total Protein 4.9 (L) 6.0 - 8.5 g/dL   Albumin 3.2 (L) 3.5 - 4.8 g/dL   Globulin, Total 1.7 1.5 - 4.5 g/dL   Albumin/Globulin Ratio 1.9 1.2 - 2.2   Bilirubin Total 0.3 0.0 - 1.2 mg/dL   Alkaline Phosphatase 102 39 - 117 IU/L   AST 14 0 - 40 IU/L   ALT 10 0 - 44 IU/L  Lipid panel  Result Value Ref Range   Cholesterol, Total 98 (L) 100 - 199 mg/dL   Triglycerides 38 0 - 149 mg/dL   HDL 50 >39 mg/dL   VLDL Cholesterol Cal 8 5 - 40 mg/dL   LDL Calculated 40 0 - 99 mg/dL   Chol/HDL Ratio 2.0 0.0 - 5.0 ratio      Assessment & Plan:   1. ESRD (end stage renal disease) ([place]) contibue dialysis  2. Hypoalbuminemia Increase dietary protein   Continue all other maintenance medications as listed above.  Follow up plan: Return if symptoms worsen or fail to improve.  Educational handout given for [place] PA-C Daisy [address]  [place], [place] [address] [phone]   11/25/2018, 1:25 PM

## 2018-11-26 DIAGNOSIS — N186 End stage renal disease: Secondary | ICD-10-CM | POA: Diagnosis not present

## 2018-11-26 DIAGNOSIS — Z1159 Encounter for screening for other viral diseases: Secondary | ICD-10-CM | POA: Diagnosis not present

## 2018-11-26 DIAGNOSIS — Z23 Encounter for immunization: Secondary | ICD-10-CM | POA: Diagnosis not present

## 2018-11-26 DIAGNOSIS — Z992 Dependence on renal dialysis: Secondary | ICD-10-CM | POA: Diagnosis not present

## 2018-11-27 ENCOUNTER — Encounter: Payer: Self-pay | Admitting: *Deleted

## 2018-11-27 ENCOUNTER — Other Ambulatory Visit: Payer: Self-pay | Admitting: *Deleted

## 2018-11-27 ENCOUNTER — Ambulatory Visit (HOSPITAL_COMMUNITY)
Admission: RE | Admit: 2018-11-27 | Discharge: 2018-11-27 | Disposition: A | Discharge status: Home / Self Care | Payer: No Typology Code available for payment source | Source: Ambulatory Visit | Attending: Vascular Surgery | Admitting: Vascular Surgery

## 2018-11-27 ENCOUNTER — Other Ambulatory Visit: Payer: Self-pay

## 2018-11-27 ENCOUNTER — Encounter: Payer: Self-pay | Admitting: Vascular Surgery

## 2018-11-27 ENCOUNTER — Ambulatory Visit (INDEPENDENT_AMBULATORY_CARE_PROVIDER_SITE_OTHER): Payer: Self-pay | Admitting: Vascular Surgery

## 2018-11-27 VITALS — BP 123/66 | HR 89 | Temp 97.6°F | Resp 18 | Ht 69.5 in | Wt 149.0 lb

## 2018-11-27 DIAGNOSIS — N183 Chronic kidney disease, stage 3 unspecified: Secondary | ICD-10-CM

## 2018-11-27 DIAGNOSIS — N186 End stage renal disease: Secondary | ICD-10-CM

## 2018-11-27 DIAGNOSIS — Z992 Dependence on renal dialysis: Secondary | ICD-10-CM

## 2018-11-27 NOTE — H&P (View-Only) (Signed)
Patient name: Don [name] MRN: [id number] DOB: 1941/06/30 Sex: male  REASON FOR VISIT:   Follow-up after left first stage basilic vein transposition  HPI:   Don [name] is a pleasant 79 y.o. male who began dialysis about a month ago.  He has a right IJ tunneled dialysis catheter.  He dialyzes on Tuesdays Thursdays and Saturdays.  I performed a first stage basilic vein transposition on 10/08/2018.  He comes in for a follow-up visit to check on the maturation of the fistula.  He denies any specific complaints in the left upper extremity.  He is on Eliquis for atrial fibrillation.  Current Outpatient Medications  Medication Sig Dispense Refill  . acetaminophen (TYLENOL) 500 MG tablet Take 500 mg by mouth every 6 (six) hours as needed for moderate pain.     [name] acitretin (SORIATANE) 10 MG capsule Take 10 mg by mouth daily.     [name] albuterol (PROAIR HFA) 108 (90 Base) MCG/ACT inhaler Inhale 2 puffs into the lungs 2 (two) times daily.     [name] apixaban (ELIQUIS) 5 MG TABS tablet Take 1 tablet (5 mg total) by mouth 2 (two) times daily. (Needs to be seen) (Patient taking differently: Take 5 mg by mouth 2 (two) times daily. ) 60 tablet 5  . atorvastatin (LIPITOR) 20 MG tablet Take 1 tablet (20 mg total) by mouth daily. (Needs to be seen) (Patient taking differently: Take 20 mg by mouth at bedtime. ) 90 tablet 3  . bisoprolol (ZEBETA) 10 MG tablet TAKE (1) TABLET TWICE A DAY. (Patient taking differently: Take 10 mg by mouth every morning. ) 180 tablet 1  . budesonide-formoterol (SYMBICORT) 160-4.5 MCG/ACT inhaler Inhale 2 puffs into the lungs 2 (two) times daily.    . calcitRIOL (ROCALTROL) 0.25 MCG capsule Take 1 capsule (0.25 mcg total) by mouth every morning. 90 capsule 3  . cefdinir (OMNICEF) 300 MG capsule Take 1 capsule (300 mg total) by mouth daily. (take AFTER hemodialysis on HD days) 7 capsule 0  . clonazePAM (KLONOPIN) 0.5 MG tablet Take 1 tablet (0.5 mg total) by mouth at bedtime. 10 tablet  0  . diltiazem (CARDIZEM CD) 240 MG 24 hr capsule TAKE (1) CAPSULE DAILY (Patient taking differently: Take 240 mg by mouth every morning. ) 90 capsule 3  . ferrous sulfate 325 (65 FE) MG tablet Take 325 mg by mouth every Monday, Wednesday, and Friday.    . fluticasone (FLONASE) 50 MCG/ACT nasal spray Place 2 sprays into both nostrils daily. 16 g 6  . hydroxypropyl methylcellulose / hypromellose (ISOPTO TEARS / GONIOVISC) 2.5 % ophthalmic solution Place 1 drop into both eyes daily as needed for dry eyes.    . iron polysaccharides (FERREX 150) 150 MG capsule Take 1 capsule (150 mg total) by mouth daily. (Needs to be seen) (Patient taking differently: Take 150 mg by mouth every morning. (Needs to be seen)) 90 capsule 3  . loratadine (CLARITIN) 10 MG tablet TAKE 1 TABLET DAILY (Patient taking differently: Take 10 mg by mouth at bedtime. ) 30 tablet 2  . Melatonin 3 MG TABS Take 3 mg by mouth at bedtime.     . Multiple Vitamin (MULTIVITAMIN WITH MINERALS) TABS tablet Take 1 tablet by mouth at bedtime.     . Multiple Vitamins-Minerals (PRESERVISION AREDS 2 PO) Take 1 tablet by mouth 2 (two) times daily.     . multivitamin (RENA-VIT) TABS tablet Take 1 tablet by mouth at bedtime. 30 tablet 0  . nicotine (  NICODERM CQ - DOSED IN MG/24 HOURS) 14 mg/24hr patch Place 1 patch (14 mg total) onto the skin daily. 28 patch 2  . Nutritional Supplements (FEEDING SUPPLEMENT, NEPRO CARB STEADY,) LIQD Take 237 mLs by mouth 2 (two) times daily between meals. 60 Can 0  . omega-3 acid ethyl esters (LOVAZA) 1 g capsule Take 2 g by mouth 2 (two) times daily.    . pantoprazole (PROTONIX) 40 MG tablet TAKE (1) TABLET TWICE A DAY. (Patient taking differently: Take 40 mg by mouth 2 (two) times daily. ) 180 tablet 1  . predniSONE (DELTASONE) 20 MG tablet Take 2 tablets (40 mg total) by mouth daily with breakfast for 5 days. 10 tablet 0  . terazosin (HYTRIN) 2 MG capsule TAKE 1 CAPSULE AT BEDTIME (Patient taking differently: Take 2  mg by mouth at bedtime. ) 90 capsule 0  . tiotropium (SPIRIVA) 18 MCG inhalation capsule Place 18 mcg into inhaler and inhale daily as needed (for shortness of breath).      No current facility-administered medications for this visit.     REVIEW OF SYSTEMS:  [X]  denotes positive finding, [ ]  denotes negative finding Vascular    Leg swelling    Cardiac    Chest pain or chest pressure:    Shortness of breath upon exertion:    Short of breath when lying flat:    Irregular heart rhythm:    Constitutional    Fever or chills:     PHYSICAL EXAM:   Vitals:   11/27/18 1037  BP: 123/66  Pulse: 89  Resp: 18  Temp: 97.6 F (36.4 C)  TempSrc: Oral  SpO2: 99%  Weight: 149 lb (67.6 kg)  Height: 5' 9.5" (1.765 m)    GENERAL: The patient is a well-nourished male, in no acute distress. The vital signs are documented above. CARDIOVASCULAR: There is a regular rate and rhythm. PULMONARY: There is good air exchange bilaterally without wheezing or rales. VASCULAR: He has a good thrill in his left upper arm fistula.  His incision is healed nicely.  He has a palpable radial pulse.  DATA:   DUPLEX LEFT FIRST STAGE BASILIC VEIN TRANSPOSITION: I have independently interpreted his duplex of his first stage basilic vein transposition.  The diameters of the fistula ranged from 0.56-1.02 cm.  MEDICAL ISSUES:   STATUS POST FIRST STAGE BASILIC VEIN TRANSPOSITION: His first stage basilic vein transposition in the left arm appears to be maturing nicely.  I have recommended that we proceed with a second stage.  This has been scheduled for a nondialysis day on 12/17/2018.  We will have to stop his Eliquis for 48 hours prior to the procedure.  I have discussed the indications for the procedure and potential risks and he is agreeable to proceed.  [name] [facility] [phone]

## 2018-11-27 NOTE — Progress Notes (Signed)
Patient name: Don [name] MRN: [id number] DOB: Mar 14, 1941 Sex: male  REASON FOR VISIT:   Follow-up after left first stage basilic vein transposition  HPI:   Don [name] is a pleasant 78 y.o. male who began dialysis about a month ago.  He has a right IJ tunneled dialysis catheter.  He dialyzes on Tuesdays Thursdays and Saturdays.  I performed a first stage basilic vein transposition on 10/08/2018.  He comes in for a follow-up visit to check on the maturation of the fistula.  He denies any specific complaints in the left upper extremity.  He is on Eliquis for atrial fibrillation.  Current Outpatient Medications  Medication Sig Dispense Refill  . acetaminophen (TYLENOL) 500 MG tablet Take 500 mg by mouth every 6 (six) hours as needed for moderate pain.     [name] acitretin (SORIATANE) 10 MG capsule Take 10 mg by mouth daily.     [name] albuterol (PROAIR HFA) 108 (90 Base) MCG/ACT inhaler Inhale 2 puffs into the lungs 2 (two) times daily.     [name] apixaban (ELIQUIS) 5 MG TABS tablet Take 1 tablet (5 mg total) by mouth 2 (two) times daily. (Needs to be seen) (Patient taking differently: Take 5 mg by mouth 2 (two) times daily. ) 60 tablet 5  . atorvastatin (LIPITOR) 20 MG tablet Take 1 tablet (20 mg total) by mouth daily. (Needs to be seen) (Patient taking differently: Take 20 mg by mouth at bedtime. ) 90 tablet 3  . bisoprolol (ZEBETA) 10 MG tablet TAKE (1) TABLET TWICE A DAY. (Patient taking differently: Take 10 mg by mouth every morning. ) 180 tablet 1  . budesonide-formoterol (SYMBICORT) 160-4.5 MCG/ACT inhaler Inhale 2 puffs into the lungs 2 (two) times daily.    . calcitRIOL (ROCALTROL) 0.25 MCG capsule Take 1 capsule (0.25 mcg total) by mouth every morning. 90 capsule 3  . cefdinir (OMNICEF) 300 MG capsule Take 1 capsule (300 mg total) by mouth daily. (take AFTER hemodialysis on HD days) 7 capsule 0  . clonazePAM (KLONOPIN) 0.5 MG tablet Take 1 tablet (0.5 mg total) by mouth at bedtime. 10 tablet  0  . diltiazem (CARDIZEM CD) 240 MG 24 hr capsule TAKE (1) CAPSULE DAILY (Patient taking differently: Take 240 mg by mouth every morning. ) 90 capsule 3  . ferrous sulfate 325 (65 FE) MG tablet Take 325 mg by mouth every Monday, Wednesday, and Friday.    . fluticasone (FLONASE) 50 MCG/ACT nasal spray Place 2 sprays into both nostrils daily. 16 g 6  . hydroxypropyl methylcellulose / hypromellose (ISOPTO TEARS / GONIOVISC) 2.5 % ophthalmic solution Place 1 drop into both eyes daily as needed for dry eyes.    . iron polysaccharides (FERREX 150) 150 MG capsule Take 1 capsule (150 mg total) by mouth daily. (Needs to be seen) (Patient taking differently: Take 150 mg by mouth every morning. (Needs to be seen)) 90 capsule 3  . loratadine (CLARITIN) 10 MG tablet TAKE 1 TABLET DAILY (Patient taking differently: Take 10 mg by mouth at bedtime. ) 30 tablet 2  . Melatonin 3 MG TABS Take 3 mg by mouth at bedtime.     . Multiple Vitamin (MULTIVITAMIN WITH MINERALS) TABS tablet Take 1 tablet by mouth at bedtime.     . Multiple Vitamins-Minerals (PRESERVISION AREDS 2 PO) Take 1 tablet by mouth 2 (two) times daily.     . multivitamin (RENA-VIT) TABS tablet Take 1 tablet by mouth at bedtime. 30 tablet 0  . nicotine (  NICODERM CQ - DOSED IN MG/24 HOURS) 14 mg/24hr patch Place 1 patch (14 mg total) onto the skin daily. 28 patch 2  . Nutritional Supplements (FEEDING SUPPLEMENT, NEPRO CARB STEADY,) LIQD Take 237 mLs by mouth 2 (two) times daily between meals. 60 Can 0  . omega-3 acid ethyl esters (LOVAZA) 1 g capsule Take 2 g by mouth 2 (two) times daily.    . pantoprazole (PROTONIX) 40 MG tablet TAKE (1) TABLET TWICE A DAY. (Patient taking differently: Take 40 mg by mouth 2 (two) times daily. ) 180 tablet 1  . predniSONE (DELTASONE) 20 MG tablet Take 2 tablets (40 mg total) by mouth daily with breakfast for 5 days. 10 tablet 0  . terazosin (HYTRIN) 2 MG capsule TAKE 1 CAPSULE AT BEDTIME (Patient taking differently: Take 2  mg by mouth at bedtime. ) 90 capsule 0  . tiotropium (SPIRIVA) 18 MCG inhalation capsule Place 18 mcg into inhaler and inhale daily as needed (for shortness of breath).      No current facility-administered medications for this visit.     REVIEW OF SYSTEMS:  [X]  denotes positive finding, [ ]  denotes negative finding Vascular    Leg swelling    Cardiac    Chest pain or chest pressure:    Shortness of breath upon exertion:    Short of breath when lying flat:    Irregular heart rhythm:    Constitutional    Fever or chills:     PHYSICAL EXAM:   Vitals:   11/27/18 1037  BP: 123/66  Pulse: 89  Resp: 18  Temp: 97.6 F (36.4 C)  TempSrc: Oral  SpO2: 99%  Weight: 149 lb (67.6 kg)  Height: 5' 9.5" (1.765 m)    GENERAL: The patient is a well-nourished male, in no acute distress. The vital signs are documented above. CARDIOVASCULAR: There is a regular rate and rhythm. PULMONARY: There is good air exchange bilaterally without wheezing or rales. VASCULAR: He has a good thrill in his left upper arm fistula.  His incision is healed nicely.  He has a palpable radial pulse.  DATA:   DUPLEX LEFT FIRST STAGE BASILIC VEIN TRANSPOSITION: I have independently interpreted his duplex of his first stage basilic vein transposition.  The diameters of the fistula ranged from 0.56-1.02 cm.  MEDICAL ISSUES:   STATUS POST FIRST STAGE BASILIC VEIN TRANSPOSITION: His first stage basilic vein transposition in the left arm appears to be maturing nicely.  I have recommended that we proceed with a second stage.  This has been scheduled for a nondialysis day on 12/17/2018.  We will have to stop his Eliquis for 48 hours prior to the procedure.  I have discussed the indications for the procedure and potential risks and he is agreeable to proceed.  [name] [facility] [phone]

## 2018-11-28 DIAGNOSIS — Z992 Dependence on renal dialysis: Secondary | ICD-10-CM | POA: Diagnosis not present

## 2018-11-28 DIAGNOSIS — Z23 Encounter for immunization: Secondary | ICD-10-CM | POA: Diagnosis not present

## 2018-11-28 DIAGNOSIS — N186 End stage renal disease: Secondary | ICD-10-CM | POA: Diagnosis not present

## 2018-11-30 DIAGNOSIS — N186 End stage renal disease: Secondary | ICD-10-CM | POA: Diagnosis not present

## 2018-11-30 DIAGNOSIS — Z992 Dependence on renal dialysis: Secondary | ICD-10-CM | POA: Diagnosis not present

## 2018-11-30 DIAGNOSIS — Z23 Encounter for immunization: Secondary | ICD-10-CM | POA: Diagnosis not present

## 2018-12-02 ENCOUNTER — Telehealth: Payer: Medicare HMO

## 2018-12-03 DIAGNOSIS — Z992 Dependence on renal dialysis: Secondary | ICD-10-CM | POA: Diagnosis not present

## 2018-12-03 DIAGNOSIS — Z23 Encounter for immunization: Secondary | ICD-10-CM | POA: Diagnosis not present

## 2018-12-03 DIAGNOSIS — N186 End stage renal disease: Secondary | ICD-10-CM | POA: Diagnosis not present

## 2018-12-03 NOTE — Telephone Encounter (Signed)
Error

## 2018-12-05 ENCOUNTER — Telehealth: Payer: Medicare HMO

## 2018-12-05 DIAGNOSIS — N186 End stage renal disease: Secondary | ICD-10-CM | POA: Diagnosis not present

## 2018-12-05 DIAGNOSIS — Z992 Dependence on renal dialysis: Secondary | ICD-10-CM | POA: Diagnosis not present

## 2018-12-05 DIAGNOSIS — Z23 Encounter for immunization: Secondary | ICD-10-CM | POA: Diagnosis not present

## 2018-12-07 DIAGNOSIS — N186 End stage renal disease: Secondary | ICD-10-CM | POA: Diagnosis not present

## 2018-12-07 DIAGNOSIS — Z23 Encounter for immunization: Secondary | ICD-10-CM | POA: Diagnosis not present

## 2018-12-07 DIAGNOSIS — Z992 Dependence on renal dialysis: Secondary | ICD-10-CM | POA: Diagnosis not present

## 2018-12-09 ENCOUNTER — Ambulatory Visit: Payer: Self-pay | Admitting: Licensed Clinical Social Worker

## 2018-12-09 DIAGNOSIS — F339 Major depressive disorder, recurrent, unspecified: Secondary | ICD-10-CM | POA: Diagnosis not present

## 2018-12-09 DIAGNOSIS — N186 End stage renal disease: Secondary | ICD-10-CM | POA: Diagnosis not present

## 2018-12-09 DIAGNOSIS — I10 Essential (primary) hypertension: Secondary | ICD-10-CM | POA: Diagnosis not present

## 2018-12-09 DIAGNOSIS — J441 Chronic obstructive pulmonary disease with (acute) exacerbation: Secondary | ICD-10-CM | POA: Diagnosis not present

## 2018-12-09 DIAGNOSIS — W19XXXD Unspecified fall, subsequent encounter: Secondary | ICD-10-CM

## 2018-12-09 NOTE — Patient Instructions (Signed)
.   Licensed Clinical Lawyer                                  . "I get sad sometimes because I live alone"        Current Barriers:  Client has transportation needs  Client has some ambulation challenges  Clinical Social Work Clinical Goal(s):Over the next 30 days, client will verbalize understanding of depression symptoms management.  Interventions:  LCSW provided counseling support services to client .  Discussed again with client self care activities: sleep hygiene, basic healthy eating practices  encouraged client to participate in recreational/relaxation activities of choice to help client manage depression symptoms  Patient Self Care Activities:   Client socializes with family weekly as scheduled.   Client contacts [name], step-son, POA, as needed for daily needs/assistance of client.  Client takes medications as prescribed.  Plan:   LCSW to contact client/POA [name]) in two weeks to discuss client needs related to depression management.   Client/[name] Hassell Done [facility]) to contact LCSW related to psychosocial needs of client .  Client to continue to use relaxation techniques of choice to help him manage depression symptoms experienced.  Client to socialize with others as able to help him in managing depression symptoms                   Follow Up Plan:    LCSW will call client or POA for client in two weeks to discuss depression management for client.     Client or POA to call LCSW as needed to discuss psychosocial needs of client         The patient verbalized understanding of instructions provided today and declined a print copy of patient instruction materials.   [name].Kaylany Tesoriero MSW, LCSW Licensed Clinical Social Worker [facility] [phone]

## 2018-12-09 NOTE — Chronic Care Management (AMB) (Signed)
  Chronic Care Management    Clinical Social Work General Follow Up Note  12/09/2018 Name: ZERIC BARANOWSKI MRN: [id number] DOB: 10-Mar-1941   Referred by:  Dr. Warrick Parisian for psychosocial assessment and possible counseling support.  Review of patient status, including review of consultants reports, relevant laboratory and other test results, and collaboration with appropriate care team members and the patient's provider was performed as part of comprehensive patient evaluation and provision of chronic care management services.    Last CCM Appointment: 12/09/2018   Office Visit from 04/04/2018 in [place]  PHQ-9 Total Score  10     Goals/Interventions              . "I get sad sometimes because I live alone"        Current Barriers:   Client has transportation needs  Client has some ambulation challenges  Clinical Social Work Clinical Goal(s): Over the next 30 days, client will verbalize understanding of depression symptoms management.  Interventions:  LCSW provided counseling support services to client .  Discussed again with client self care activities: sleep hygiene, basic healthy eating practices  encouraged client to participate in recreational/relaxation activities of choice to help client manage depression symptoms  Patient Self Care Activities:   Client socializes with family weekly as scheduled.   Client contacts [name], step-son, POA, as needed for daily needs/assistance of client.  Client takes medications as prescribed.  Plan:   LCSW to contact client/POA [name]) in two weeks to discuss client needs related to depression management.   Client/[name] Hassell Done [facility]) to contact LCSW related to psychosocial needs of client .  Client to continue to use relaxation techniques of choice to help him manage depression symptoms experienced.   Client to socialize with others as able to help him in managing depression symptoms      Follow Up Plan:   LCSW will call client or POA [name] in two weeks.    Mr. Treptow or his POA [name] will reach out to me by phone if needed prior to my call.           [name].Ranell Skibinski MSW, LCSW Licensed Clinical Social Worker [facility] [phone]

## 2018-12-10 DIAGNOSIS — Z992 Dependence on renal dialysis: Secondary | ICD-10-CM | POA: Diagnosis not present

## 2018-12-10 DIAGNOSIS — N186 End stage renal disease: Secondary | ICD-10-CM | POA: Diagnosis not present

## 2018-12-10 DIAGNOSIS — Z23 Encounter for immunization: Secondary | ICD-10-CM | POA: Diagnosis not present

## 2018-12-12 DIAGNOSIS — Z23 Encounter for immunization: Secondary | ICD-10-CM | POA: Diagnosis not present

## 2018-12-12 DIAGNOSIS — Z992 Dependence on renal dialysis: Secondary | ICD-10-CM | POA: Diagnosis not present

## 2018-12-12 DIAGNOSIS — N186 End stage renal disease: Secondary | ICD-10-CM | POA: Diagnosis not present

## 2018-12-13 ENCOUNTER — Other Ambulatory Visit: Payer: Self-pay

## 2018-12-13 ENCOUNTER — Encounter (HOSPITAL_COMMUNITY): Payer: Self-pay | Admitting: *Deleted

## 2018-12-13 DIAGNOSIS — N186 End stage renal disease: Secondary | ICD-10-CM | POA: Diagnosis not present

## 2018-12-13 DIAGNOSIS — Z992 Dependence on renal dialysis: Secondary | ICD-10-CM | POA: Diagnosis not present

## 2018-12-13 NOTE — Progress Notes (Signed)
Spoke with pt's son, [name] for pre-op call. He is pt's Healthcare POA. Pt has hx of A-fib and sees Dr. Percival Spanish. Last office visit was in November 2019. He states pt does not c/o chest pain. Pt is a pre-diabetic, but does not check his blood sugar at home. Pt's last A1C was 4.9 on 11/18/18.

## 2018-12-15 NOTE — Anesthesia Preprocedure Evaluation (Addendum)
Anesthesia Evaluation  Patient identified by MRN, date of birth, ID band Patient awake    Reviewed: Allergy & Precautions, NPO status , Patient's Chart, lab work & pertinent test results  Airway Mallampati: II  TM Distance: >3 FB Neck ROM: Full    Dental  (+) Edentulous Upper, Edentulous Lower   Pulmonary COPD,  COPD inhaler, former smoker,    Pulmonary exam normal breath sounds clear to auscultation       Cardiovascular hypertension, Pt. on home beta blockers +CHF  Normal cardiovascular exam+ dysrhythmias Atrial Fibrillation  Rhythm:Regular Rate:Normal  ECG: A-fib, rate 77  ECHO: LV EF: 55%     Neuro/Psych PSYCHIATRIC DISORDERS Anxiety Depression negative neurological ROS     GI/Hepatic negative GI ROS, Neg liver ROS,   Endo/Other  negative endocrine ROS  Renal/GU ESRF and DialysisRenal disease     Musculoskeletal negative musculoskeletal ROS (+)   Abdominal   Peds  Hematology  (+) anemia , HLD   Anesthesia Other Findings End stage renal disease  Reproductive/Obstetrics                           Anesthesia Physical Anesthesia Plan  ASA: IV  Anesthesia Plan: Regional   Post-op Pain Management:    Induction: Intravenous  PONV Risk Score and Plan: 1 and Propofol infusion and Treatment may vary due to age or medical condition  Airway Management Planned: Natural Airway  Additional Equipment:   Intra-op Plan:   Post-operative Plan:   Informed Consent: I have reviewed the patients History and Physical, chart, labs and discussed the procedure including the risks, benefits and alternatives for the proposed anesthesia with the patient or authorized representative who has indicated his/her understanding and acceptance.       Plan Discussed with: CRNA  Anesthesia Plan Comments:        Anesthesia Quick Evaluation

## 2018-12-16 ENCOUNTER — Ambulatory Visit (HOSPITAL_COMMUNITY)
Admission: RE | Admit: 2018-12-16 | Discharge: 2018-12-16 | Disposition: A | Discharge status: Home / Self Care | Payer: Medicare HMO | Source: Ambulatory Visit | Attending: Vascular Surgery | Admitting: Vascular Surgery

## 2018-12-16 ENCOUNTER — Ambulatory Visit (HOSPITAL_COMMUNITY): Payer: Medicare HMO | Admitting: Anesthesiology

## 2018-12-16 ENCOUNTER — Encounter (HOSPITAL_COMMUNITY): Payer: Self-pay | Admitting: *Deleted

## 2018-12-16 ENCOUNTER — Encounter (HOSPITAL_COMMUNITY)
Admission: RE | Disposition: A | Discharge status: Home / Self Care | Payer: Self-pay | Source: Ambulatory Visit | Attending: Vascular Surgery

## 2018-12-16 DIAGNOSIS — F329 Major depressive disorder, single episode, unspecified: Secondary | ICD-10-CM | POA: Diagnosis not present

## 2018-12-16 DIAGNOSIS — Z7901 Long term (current) use of anticoagulants: Secondary | ICD-10-CM | POA: Insufficient documentation

## 2018-12-16 DIAGNOSIS — D631 Anemia in chronic kidney disease: Secondary | ICD-10-CM | POA: Insufficient documentation

## 2018-12-16 DIAGNOSIS — R7303 Prediabetes: Secondary | ICD-10-CM | POA: Diagnosis not present

## 2018-12-16 DIAGNOSIS — N186 End stage renal disease: Secondary | ICD-10-CM | POA: Insufficient documentation

## 2018-12-16 DIAGNOSIS — Z87891 Personal history of nicotine dependence: Secondary | ICD-10-CM | POA: Diagnosis not present

## 2018-12-16 DIAGNOSIS — I4891 Unspecified atrial fibrillation: Secondary | ICD-10-CM | POA: Diagnosis not present

## 2018-12-16 DIAGNOSIS — Z992 Dependence on renal dialysis: Secondary | ICD-10-CM | POA: Diagnosis not present

## 2018-12-16 DIAGNOSIS — G8918 Other acute postprocedural pain: Secondary | ICD-10-CM | POA: Diagnosis not present

## 2018-12-16 DIAGNOSIS — E785 Hyperlipidemia, unspecified: Secondary | ICD-10-CM | POA: Insufficient documentation

## 2018-12-16 DIAGNOSIS — J449 Chronic obstructive pulmonary disease, unspecified: Secondary | ICD-10-CM | POA: Diagnosis not present

## 2018-12-16 DIAGNOSIS — Z7951 Long term (current) use of inhaled steroids: Secondary | ICD-10-CM | POA: Insufficient documentation

## 2018-12-16 DIAGNOSIS — I132 Hypertensive heart and chronic kidney disease with heart failure and with stage 5 chronic kidney disease, or end stage renal disease: Secondary | ICD-10-CM | POA: Diagnosis not present

## 2018-12-16 DIAGNOSIS — Z79899 Other long term (current) drug therapy: Secondary | ICD-10-CM | POA: Insufficient documentation

## 2018-12-16 DIAGNOSIS — F419 Anxiety disorder, unspecified: Secondary | ICD-10-CM | POA: Insufficient documentation

## 2018-12-16 DIAGNOSIS — N185 Chronic kidney disease, stage 5: Secondary | ICD-10-CM | POA: Diagnosis not present

## 2018-12-16 DIAGNOSIS — I509 Heart failure, unspecified: Secondary | ICD-10-CM | POA: Diagnosis not present

## 2018-12-16 HISTORY — PX: BASCILIC VEIN TRANSPOSITION: SHX5742

## 2018-12-16 LAB — POCT I-STAT 4, (NA,K, GLUC, HGB,HCT)
Glucose, Bld: 93 mg/dL (ref 70–99)
HCT: 23 % — ABNORMAL LOW (ref 39.0–52.0)
Hemoglobin: 7.8 g/dL — ABNORMAL LOW (ref 13.0–17.0)
Potassium: 4.6 mmol/L (ref 3.5–5.1)
Sodium: 144 mmol/L (ref 135–145)

## 2018-12-16 LAB — GLUCOSE, CAPILLARY: GLUCOSE-CAPILLARY: 74 mg/dL (ref 70–99)

## 2018-12-16 SURGERY — TRANSPOSITION, VEIN, BASILIC
Anesthesia: Monitor Anesthesia Care | Site: Arm Upper | Laterality: Left

## 2018-12-16 MED ORDER — SODIUM CHLORIDE 0.9 % IV SOLN
INTRAVENOUS | Status: DC | PRN
  Administered 2018-12-16: 500 mL

## 2018-12-16 MED ORDER — PROPOFOL 10 MG/ML IV BOLUS
INTRAVENOUS | Status: DC | PRN
  Administered 2018-12-16 (×2): 25 mg via INTRAVENOUS

## 2018-12-16 MED ORDER — VANCOMYCIN HCL IN DEXTROSE 1-5 GM/200ML-% IV SOLN
1000.0000 mg | INTRAVENOUS | Status: AC
  Administered 2018-12-16: 1000 mg via INTRAVENOUS
  Filled 2018-12-16: qty 200

## 2018-12-16 MED ORDER — BACITRACIN ZINC 500 UNIT/GM EX OINT
TOPICAL_OINTMENT | CUTANEOUS | Status: AC
  Filled 2018-12-16: qty 28.35

## 2018-12-16 MED ORDER — CEFAZOLIN SODIUM-DEXTROSE 2-4 GM/100ML-% IV SOLN
2.0000 g | INTRAVENOUS | Status: AC
  Administered 2018-12-16: 2 g via INTRAVENOUS
  Filled 2018-12-16: qty 100

## 2018-12-16 MED ORDER — BACITRACIN ZINC 500 UNIT/GM EX OINT
TOPICAL_OINTMENT | CUTANEOUS | Status: DC | PRN
  Administered 2018-12-16: 1 via TOPICAL

## 2018-12-16 MED ORDER — SODIUM CHLORIDE 0.9 % IV SOLN
INTRAVENOUS | Status: AC
  Filled 2018-12-16: qty 1.2

## 2018-12-16 MED ORDER — PROTAMINE SULFATE 10 MG/ML IV SOLN
INTRAVENOUS | Status: DC | PRN
  Administered 2018-12-16: 40 mg via INTRAVENOUS

## 2018-12-16 MED ORDER — PHENYLEPHRINE HCL 10 MG/ML IJ SOLN
INTRAMUSCULAR | Status: DC | PRN
  Administered 2018-12-16: 80 ug via INTRAVENOUS
  Administered 2018-12-16: 120 ug via INTRAVENOUS
  Administered 2018-12-16: 80 ug via INTRAVENOUS

## 2018-12-16 MED ORDER — SODIUM CHLORIDE 0.9 % IV SOLN
INTRAVENOUS | Status: DC
  Administered 2018-12-16: 08:00:00 via INTRAVENOUS

## 2018-12-16 MED ORDER — FENTANYL CITRATE (PF) 100 MCG/2ML IJ SOLN
INTRAMUSCULAR | Status: AC
  Administered 2018-12-16: 25 ug via INTRAVENOUS
  Filled 2018-12-16: qty 2

## 2018-12-16 MED ORDER — PROPOFOL 500 MG/50ML IV EMUL
INTRAVENOUS | Status: DC | PRN
  Administered 2018-12-16: 25 ug/kg/min via INTRAVENOUS

## 2018-12-16 MED ORDER — MEPIVACAINE HCL 1.5 % IJ SOLN
INTRAMUSCULAR | Status: DC | PRN
  Administered 2018-12-16: 20 mL via PERINEURAL

## 2018-12-16 MED ORDER — LIDOCAINE HCL (PF) 1 % IJ SOLN
INTRAMUSCULAR | Status: DC | PRN
  Administered 2018-12-16: 17 mL

## 2018-12-16 MED ORDER — FENTANYL CITRATE (PF) 100 MCG/2ML IJ SOLN
25.0000 ug | Freq: Once | INTRAMUSCULAR | Status: AC
  Administered 2018-12-16: 25 ug via INTRAVENOUS

## 2018-12-16 MED ORDER — OXYCODONE HCL 5 MG PO TABS: 5.0000 mg | ORAL_TABLET | ORAL | 0 refills | Status: DC | PRN

## 2018-12-16 MED ORDER — HEPARIN SODIUM (PORCINE) 1000 UNIT/ML IJ SOLN
INTRAMUSCULAR | Status: DC | PRN
  Administered 2018-12-16: 6000 [IU] via INTRAVENOUS

## 2018-12-16 MED ORDER — 0.9 % SODIUM CHLORIDE (POUR BTL) OPTIME
TOPICAL | Status: DC | PRN
  Administered 2018-12-16: 1000 mL

## 2018-12-16 MED ORDER — ONDANSETRON HCL 4 MG/2ML IJ SOLN
INTRAMUSCULAR | Status: DC | PRN
  Administered 2018-12-16: 4 mg via INTRAVENOUS

## 2018-12-16 SURGICAL SUPPLY — 36 items
ARMBAND PINK RESTRICT EXTREMIT (MISCELLANEOUS) ×3 IMPLANT
BANDAGE ELASTIC 4 VELCRO ST LF (GAUZE/BANDAGES/DRESSINGS) ×3 IMPLANT
BNDG GAUZE ELAST 4 BULKY (GAUZE/BANDAGES/DRESSINGS) ×3 IMPLANT
CANISTER SUCT 3000ML PPV (MISCELLANEOUS) ×3 IMPLANT
CANNULA VESSEL 3MM 2 BLNT TIP (CANNULA) ×3 IMPLANT
CLIP VESOCCLUDE MED 24/CT (CLIP) ×3 IMPLANT
CLIP VESOCCLUDE SM WIDE 24/CT (CLIP) ×3 IMPLANT
COVER PROBE W GEL 5X96 (DRAPES) IMPLANT
COVER WAND RF STERILE (DRAPES) IMPLANT
DECANTER SPIKE VIAL GLASS SM (MISCELLANEOUS) ×3 IMPLANT
DERMABOND ADVANCED (GAUZE/BANDAGES/DRESSINGS) ×2
DERMABOND ADVANCED .7 DNX12 (GAUZE/BANDAGES/DRESSINGS) ×1 IMPLANT
ELECT REM PT RETURN 9FT ADLT (ELECTROSURGICAL) ×3
ELECTRODE REM PT RTRN 9FT ADLT (ELECTROSURGICAL) ×1 IMPLANT
GAUZE SPONGE 4X4 12PLY STRL LF (GAUZE/BANDAGES/DRESSINGS) ×3 IMPLANT
GLOVE BIO SURGEON STRL SZ7.5 (GLOVE) ×3 IMPLANT
GLOVE BIOGEL PI IND STRL 8 (GLOVE) ×1 IMPLANT
GLOVE BIOGEL PI INDICATOR 8 (GLOVE) ×2
GOWN STRL REUS W/ TWL LRG LVL3 (GOWN DISPOSABLE) ×3 IMPLANT
GOWN STRL REUS W/TWL LRG LVL3 (GOWN DISPOSABLE) ×6
KIT BASIN OR (CUSTOM PROCEDURE TRAY) ×3 IMPLANT
KIT TURNOVER KIT B (KITS) ×3 IMPLANT
NS IRRIG 1000ML POUR BTL (IV SOLUTION) ×3 IMPLANT
PACK CV ACCESS (CUSTOM PROCEDURE TRAY) ×3 IMPLANT
PAD ARMBOARD 7.5X6 YLW CONV (MISCELLANEOUS) ×6 IMPLANT
SPONGE SURGIFOAM ABS GEL 100 (HEMOSTASIS) IMPLANT
SUT PROLENE 6 0 BV (SUTURE) ×6 IMPLANT
SUT SILK 2 0 (SUTURE) ×2
SUT SILK 2 0 SH (SUTURE) ×3 IMPLANT
SUT SILK 2-0 18XBRD TIE 12 (SUTURE) ×1 IMPLANT
SUT VIC AB 3-0 SH 27 (SUTURE) ×4
SUT VIC AB 3-0 SH 27X BRD (SUTURE) ×2 IMPLANT
SUT VICRYL 4-0 PS2 18IN ABS (SUTURE) ×6 IMPLANT
TOWEL GREEN STERILE (TOWEL DISPOSABLE) ×3 IMPLANT
UNDERPAD 30X30 (UNDERPADS AND DIAPERS) ×3 IMPLANT
WATER STERILE IRR 1000ML POUR (IV SOLUTION) ×3 IMPLANT

## 2018-12-16 NOTE — Op Note (Signed)
    NAME: Don [name]    MRN: [id number] DOB: [birth year]    DATE OF OPERATION: 12/16/2018  PREOP DIAGNOSIS:    End-stage renal disease  POSTOP DIAGNOSIS:    Same  PROCEDURE:    Second stage basilic vein transposition  SURGEON: Judeth Cornfield. Scot Dock, MD, FACS  ASSIST: Gaye Alken, RNFA  ANESTHESIA: Regional block  EBL: Minimal  INDICATIONS:    Don [name] is a 78 y.o. male who had a first stage basilic vein transposition and presents for second stage.  FINDINGS:   Excellent thrill at the completion of the procedure.  TECHNIQUE:   The patient was taken to the operating room after an axillary block was placed by anesthesia.  The left arm was prepped and draped in usual sterile fashion.  A longitudinal incision was made over the basilic vein in the mid upper arm.  The vein was dissected free circumferentially with branches divided between clips and ties.  Second incision was made just below the axilla where the vein was harvested all the way up to the axilla.  Next a curvilinear incision was made just above the antecubital level where the vein was dissected free up to where it was anastomosed to the artery.  There was a gentle curve in the vein allowing plenty of vein to re-tunnel this laterally.  A tunnel was created between the arterial anastomosis and the axilla and the patient was heparinized.  The vein was clamped proximally and distally and divided.  It had been marked to prevent twisting.  It was gently distended and then brought to the tunnel.  The vein was then sewn back into in with 2 continuous 6-0 Prolene sutures.  The completion there was an excellent thrill in the fistula.  Hemostasis was obtained of the wounds.  Each of the wounds was closed with a deep layer of 3-0 Vicryl and the skin closed with 4-0 Vicryl.  Dermabond was applied.  The patient tolerated the procedure well and was transferred to the recovery room in stable condition.  All needle and sponge counts  were correct.  [name], MD, FACS [facility]  DATE OF DICTATION:   12/16/2018

## 2018-12-16 NOTE — Interval H&P Note (Signed)
History and Physical Interval Note:  12/16/2018 9:19 AM  Don [name]  has presented today for surgery, with the diagnosis of end stage renal disease  The various methods of treatment have been discussed with the patient and family. After consideration of risks, benefits and other options for treatment, the patient has consented to  Procedure(s): BASILIC VEIN TRANSPOSITION SECOND STAGE (Left) as a surgical intervention .  [name] history has been reviewed, patient examined, no change in status, stable for surgery.  I have reviewed the patient's chart and labs.  Questions were answered to the patient's satisfaction.     [name]

## 2018-12-16 NOTE — Progress Notes (Signed)
Patient has rash to left neck. Dr. Scot Dock notified.

## 2018-12-16 NOTE — Transfer of Care (Signed)
Immediate Anesthesia Transfer of Care Note  Patient: Don [name]  Procedure(s) Performed: BASILIC VEIN TRANSPOSITION SECOND STAGE (Left Arm Upper)  Patient Location: PACU  Anesthesia Type:MAC and Regional  Level of Consciousness: awake, alert  and patient cooperative  Airway & Oxygen Therapy: Patient Spontanous Breathing  Post-op Assessment: Report given to RN and Post -op Vital signs reviewed and stable  Post vital signs: Reviewed and stable  Last Vitals:  Vitals Value Taken Time  BP 124/63 12/16/2018 12:13 PM  Temp    Pulse 84 12/16/2018 12:15 PM  Resp 14 12/16/2018 12:15 PM  SpO2 97 % 12/16/2018 12:15 PM  Vitals shown include unvalidated device data.  Last Pain:  Vitals:   12/16/18 0741  TempSrc:   PainSc: 0-No pain      Patients Stated Pain Goal: 3 (46/65/99 3570)  Complications: No apparent anesthesia complications

## 2018-12-16 NOTE — Anesthesia Procedure Notes (Signed)
Anesthesia Regional Block: Supraclavicular block   Pre-Anesthetic Checklist: ,, timeout performed, Correct Patient, Correct Site, Correct Laterality, Correct Procedure, Correct Position, site marked, Risks and benefits discussed,  Surgical consent,  Pre-op evaluation,  At surgeon's request and post-op pain management  Laterality: Left  Prep: chloraprep       Needles:  Injection technique: Single-shot  Needle Type: Echogenic Stimulator Needle     Needle Length: 9cm  Needle Gauge: 21     Additional Needles:   Procedures:,,,, ultrasound used (permanent image in chart),,,,  Narrative:  Start time: 12/16/2018 9:15 AM End time: 12/16/2018 9:25 AM Injection made incrementally with aspirations every 5 mL.  Performed by: Personally  Anesthesiologist: Murvin Natal, MD  Additional Notes: Functioning IV was confirmed and monitors were applied.  A timeout was performed. Sterile prep, hand hygiene and sterile gloves were used. A 90mm 21ga Arrow echogenic stimulator needle was used. Negative aspiration and negative test dose prior to incremental administration of local anesthetic. The patient tolerated the procedure well.  Ultrasound guidance: relevent anatomy identified, needle position confirmed, local anesthetic spread visualized around nerve(s), vascular puncture avoided.  Image printed for medical record.

## 2018-12-16 NOTE — Progress Notes (Signed)
Dr. Roanna Banning notified of hgb of 7.8.

## 2018-12-16 NOTE — Anesthesia Postprocedure Evaluation (Signed)
Anesthesia Post Note  Patient: Don [name]  Procedure(s) Performed: BASILIC VEIN TRANSPOSITION SECOND STAGE (Left Arm Upper)     Patient location during evaluation: PACU Anesthesia Type: Regional Level of consciousness: awake and alert Pain management: pain level controlled Vital Signs Assessment: post-procedure vital signs reviewed and stable Respiratory status: spontaneous breathing, nonlabored ventilation, respiratory function stable and patient connected to nasal cannula oxygen Cardiovascular status: stable and blood pressure returned to baseline Postop Assessment: no apparent nausea or vomiting Anesthetic complications: no    Last Vitals:  Vitals:   12/16/18 1315 12/16/18 1320  BP:    Pulse: 83 86  Resp: (!) 25 16  Temp: (!) 36.3 C   SpO2: 97% 98%    Last Pain:  Vitals:   12/16/18 1315  TempSrc:   PainSc: 0-No pain                 Ryan P Ellender

## 2018-12-17 ENCOUNTER — Encounter (HOSPITAL_COMMUNITY): Payer: Self-pay | Admitting: Vascular Surgery

## 2018-12-23 ENCOUNTER — Ambulatory Visit (INDEPENDENT_AMBULATORY_CARE_PROVIDER_SITE_OTHER): Payer: Medicare HMO | Admitting: Licensed Clinical Social Worker

## 2018-12-23 DIAGNOSIS — N186 End stage renal disease: Secondary | ICD-10-CM | POA: Diagnosis not present

## 2018-12-23 DIAGNOSIS — W19XXXD Unspecified fall, subsequent encounter: Secondary | ICD-10-CM

## 2018-12-23 DIAGNOSIS — I10 Essential (primary) hypertension: Secondary | ICD-10-CM | POA: Diagnosis not present

## 2018-12-23 DIAGNOSIS — F339 Major depressive disorder, recurrent, unspecified: Secondary | ICD-10-CM

## 2018-12-23 NOTE — Chronic Care Management (AMB) (Signed)
Chronic Care Management    Clinical Social Work General Follow Up Note  12/23/2018 Name: Don [name] MRN: [id number] DOB: Oct 25, 1941   Referred by: PCP, Dr.Joshua Dettinger for psychosocial assessment and counseling support  Review of patient status, including review of consultants reports, relevant laboratory and other test results, and collaboration with appropriate care team members and the patient's provider was performed as part of comprehensive patient evaluation and provision of chronic care management services.    Last CCM Appointment: 12/23/2018    Chronic Care Management from 12/23/2018 in [facility]  PHQ-9 Total Score  9      GAD 7 : Generalized Anxiety Score 05/25/2016  Nervous, Anxious, on Edge 1  Control/stop worrying 2  Worry too much - different things 2  Trouble relaxing 1  Restless 0  Easily annoyed or irritable 1  Afraid - awful might happen 0  Total GAD 7 Score 7  Anxiety Difficulty Not difficult at all    Goals    . "I don't know if I have a special diet for dialysis" (pt-stated)     Nurse Case Manager Clinical Goal(s): Within the next 30 days patient and/or caregiver will verbalize understanding of any dietary and fluid restriction related to dialysis.   Interventions:  . Discussed current fluid intake with patient and caregiver . Assessed knowledge of any dietary restrictions or instructions . Collaborate with Dr Lowanda Foster about any fluid and dietary restrictions related to dialysis . Educate patient and/or caregiver on diet and fluid intake insturctions  Patient Self Care Activities:  . Prepares own quick meals, mostly in the microwave . Aware of current fluid volume intake        . "I get sad sometimes because I live alone"     Current Barriers:   Client has transportation needs . Client has some ambulation challenges  Clinical Social Work Clinical Goal(s): Over the next 30 days, client will verbalize understanding  of depression symptoms management.  Interventions: . LCSW provided counseling support services to client as follows: . Assessed patient's current psychosocial needs . Discussed self care activities: sleep hygiene, basic healthy eating practices . Encouraged client to participate in recreational/relaxation activities of choice . Encouraged client to communicate as needed with POA to discuss client needs  Patient Self Care Activities:  . Client socializes with family weekly as scheduled.  . Client contacts Don, step-son, POA, as needed for daily needs/assistance of client. . Client takes medications as prescribed.  Plan:  . LCSW to contact client/POA Don [name]) in two weeks to discuss client needs related to depression management.  Don Spark [facility]) to contact LCSW related to psychosocial needs of client .  Client to use relaxation techniques of choice to help him manage depression symptoms.     Client to socialize with others, as able, to help client in managing depression symptoms.  Client to communicate with RN CM Don [name] as needed to discuss nursing needs of client.       [name] "I need help with getting and fixing my food" (pt-stated)     Nurse Case Manager Clinical Goal(s): In the next 2 weeks, patient and/or caregiver will verbalize understanding of how to obtain and prepare meals.  Interventions:  1. Information given on the Medicare Well Dine Program [phone] 2. Discussed ability to use the microwave to heat up the packaged meals   Patient Self Care Activities: 1. Caregiver attested that Don [name] is able to use his current microwave and  that he is willing to purchase a new one for Don [name] if at some point he is unable to use it properly  See "Past Updates" for all previously related goals and interventions.    [name] "I need help with transportation to dialysis and other appointments" (pt-stated)     Nurse Case Manager Clinical Goal(s): In the next  30 days patient and caregiver/POA will verbalize understanding of resources for transportation.  Interventions:  1. Recommended [facility] in [place] [phone]) as a resource for transportation arrangement  2. Collaborated with Don Nan, LCSW who relayed that [facility] coverage includes a benefit that covers transportation costs to and from appointment six times a year.  Patient Self Care Activities:  2. Stepson and daughter-in-law provide all of his transportation at this time     . "I want to make sure that I'm keeping up with my blood pressure" (pt-stated)     Nurse Case Manager Clinical Goal(s): Within the next 30 days patient and caregiver will verbalize understanding of need to monitor and record blood pressure readings regularly  Interventions:  . Encouraged to continue checking blood pressure at home . Record blood pressure readings in the blue book that I provided and take it with you to all appointments . Call our office with any blood pressure readings that are below 100/50 or higher than 140/90. . Print and provide EMMI handouts on blood pressure measurement and management  Patient Self Care Activities:  . Checks blood pressure on home monitor . Takes medication to manage hypertension      . "I'm worried about falling at home when I'm alone and not being able to get to the telephone" (pt-stated)     Nurse Case Manager Clinical Goal(s): In the next 30 days patient and caregiver will verbalize understanding of ways to decrease his risk for falls in the home and will verbalize understanding of available medical alert systems.  Interventions:  3. Falls assessment performed 4. Education provided to patient and caregiver on fall prevention 5. Contacted Houston Siren with [place] [phone]) to arrange setup of a home medical alert system 6. Print and provide EMMI handouts on fall prevention    Patient Self Care Activities: 1. Uses a  cane 2. Aware of movement limitations and what increases his personal risk for falls. He tries to make a conscious effort to change positions slowly.   See "Past Updates" for previous updates related to this goal.     . Caregiver stated "I need more understanding of his dementia diagnosis. We were told that he has a "touch of dementia." (pt-stated)     Nurse Case Manager Clinical Goal(s): In the next 30 days patient and caregiver will verbalize understanding of dementia diagnosis and management strategies.   Interventions:  7. Addressed general questions regarding dementia diagnosis 8. Addressed impact of dementia and progression on general health and self care capability.   Patient Self Care Activities:  3. Patients lives alone and is able to perform most ADLs on his own at this point. Patient verbalized that he is starting to have some difficulty with meal preparation and using his microwave  4. Caregiver stated that he will review Don [name]'s microwave with him and they can purchase a more user friendly one if necessary 5. Caregiver will continue to provide assistance as needed and check in on patient regularly     . DIET - EAT MORE FRUITS AND VEGETABLES    . Have 3 meals a day  Follow Up Plan: LCSW to call client or POA Don [name] (stepson) in next 2 weeks to discuss depression symptoms management for client.    [name].Gertie Broerman MSW, LCSW Licensed Clinical Social Worker [facility] [phone]

## 2018-12-23 NOTE — Patient Instructions (Addendum)
Licensed Clinical Social Worker Visit Information  Materials Provided: No  Goals    . "I don't know if I have a special diet for dialysis" (pt-stated)     Nurse Case Manager Clinical Goal(s): Within the next 30 days patient and/or caregiver will verbalize understanding of any dietary and fluid restriction related to dialysis.   Interventions:  . Discussed current fluid intake with patient and caregiver . Assessed knowledge of any dietary restrictions or instructions . Collaborate with Dr Lowanda Foster about any fluid and dietary restrictions related to dialysis . Educate patient and/or caregiver on diet and fluid intake insturctions  Patient Self Care Activities:  . Prepares own quick meals, mostly in the microwave . Aware of current fluid volume intake     . "I get sad sometimes because I live alone"     Current Barriers:   Client has transportation needs . Client has some ambulation challenges  Clinical Social Work Clinical Goal(s): Over the next 30 days, client will verbalize understanding of depression symptoms management.  Interventions: . LCSW provided counseling support services to client as follows: . Assessed patient's current psychosocial needs . Discussed self care activities: sleep hygiene, basic healthy eating practices . Encouraged client to participate in recreational/relaxation activities of choice . Encouraged client to socialize with others to help in managing client's depression symptoms  Patient Self Care Activities:  . Client socializes with family weekly as scheduled.  . Client contacts [name], step-son, POA, as needed for daily needs/assistance of client. . Client takes medications as prescribed.  Plan:  . LCSW to contact client/POA [name]) in two weeks to discuss client needs related to depression management.  [name] [facility]) to contact LCSW related to psychosocial needs of client . [name] Client to use relaxation techniques of choice to help  him manage depression symptoms.  . Client to socialize with other to help manage depression symptoms . Client to communicate with RN CM [name] to discuss client's nursing needs.      [name] "I need help with getting and fixing my food" (pt-stated)     Nurse Case Manager Clinical Goal(s): In the next 2 weeks, patient and/or caregiver will verbalize understanding of how to obtain and prepare meals.  Interventions:  1. Information given on the Medicare Well Dine Program [phone] 2. Discussed ability to use the microwave to heat up the packaged meals   Patient Self Care Activities: 1. Caregiver attested that Mr Stauber is able to use his current microwave and that he is willing to purchase a new one for Mr Crumpacker if at some point he is unable to use it properly  See "Past Updates" for all previously related goals and interventions.    [name] "I need help with transportation to dialysis and other appointments" (pt-stated)     Nurse Case Manager Clinical Goal(s): In the next 30 days patient and caregiver/POA will verbalize understanding of resources for transportation.  Interventions:  1. Recommended [facility] in [place] [phone]) as a resource for transportation arrangement  2. Collaborated with Theadore Nan, LCSW who relayed that [facility] coverage includes a benefit that covers transportation costs to and from appointment six times a year.  Patient Self Care Activities:  2. Stepson and daughter-in-law provide all of his transportation at this time        . "I want to make sure that I'm keeping up with my blood pressure" (pt-stated)     Nurse Case Manager Clinical Goal(s): Within the next 30 days  patient and caregiver will verbalize understanding of need to monitor and record blood pressure readings regularly  Interventions:  . Encouraged to continue checking blood pressure at home . Record blood pressure readings in the blue book that I provided and take it with  you to all appointments . Call our office with any blood pressure readings that are below 100/50 or higher than 140/90. . Print and provide EMMI handouts on blood pressure measurement and management  Patient Self Care Activities:  . Checks blood pressure on home monitor . Takes medication to manage hypertension      . "I'm worried about falling at home when I'm alone and not being able to get to the telephone" (pt-stated)     Nurse Case Manager Clinical Goal(s): In the next 30 days patient and caregiver will verbalize understanding of ways to decrease his risk for falls in the home and will verbalize understanding of available medical alert systems.  Interventions:  3. Falls assessment performed 4. Education provided to patient and caregiver on fall prevention 5. Contacted Houston Siren with [place] [phone]) to arrange setup of a home medical alert system 6. Print and provide EMMI handouts on fall prevention    Patient Self Care Activities: 1. Uses a cane 2. Aware of movement limitations and what increases his personal risk for falls. He tries to make a conscious effort to change positions slowly.   See "Past Updates" for previous updates related to this goal.     . Caregiver stated "I need more understanding of his dementia diagnosis. We were told that he has a "touch of dementia." (pt-stated)     Nurse Case Manager Clinical Goal(s): In the next 30 days patient and caregiver will verbalize understanding of dementia diagnosis and management strategies.   Interventions:  7. Addressed general questions regarding dementia diagnosis 8. Addressed impact of dementia and progression on general health and self care capability.   Patient Self Care Activities:  3. Patients lives alone and is able to perform most ADLs on his own at this point. Patient verbalized that he is starting to have some difficulty with meal preparation and using his microwave  4. Caregiver stated that  he will review Mr Carvell's microwave with him and they can purchase a more user friendly one if necessary 5. Caregiver will continue to provide assistance as needed and check in on patient regularly       . DIET - EAT MORE FRUITS AND VEGETABLES    . Have 3 meals a day      Follow Up Plan: LCSW to call client or POA [name] in 2 weeks to discuss client needs and client management of depression symptoms of client,  The patient verbalized understanding of instructions provided today and declined a print copy of patient instruction materials.   [name].Kelcy Laible MSW, LCSW Licensed Clinical Social Worker [facility] [phone]

## 2018-12-31 ENCOUNTER — Other Ambulatory Visit: Payer: Self-pay | Admitting: *Deleted

## 2019-01-01 MED ORDER — CLONAZEPAM 1 MG PO TABS: 0.5000 mg | ORAL_TABLET | Freq: Every day | ORAL | 1 refills | Status: DC

## 2019-01-03 ENCOUNTER — Ambulatory Visit: Payer: Self-pay | Admitting: Licensed Clinical Social Worker

## 2019-01-03 DIAGNOSIS — N186 End stage renal disease: Secondary | ICD-10-CM

## 2019-01-03 DIAGNOSIS — I10 Essential (primary) hypertension: Secondary | ICD-10-CM

## 2019-01-03 DIAGNOSIS — F339 Major depressive disorder, recurrent, unspecified: Secondary | ICD-10-CM

## 2019-01-03 DIAGNOSIS — W19XXXD Unspecified fall, subsequent encounter: Secondary | ICD-10-CM

## 2019-01-03 NOTE — Patient Instructions (Addendum)
Licensed Clinical Water engineer Provided:  No  Goals we discussed today:    "I get sad sometimes because I live alone"   Current Barriers:   Client has transportation needs . Client has some ambulation challenges  Clinical Social Work Clinical Goal(s): Over the next 30 days, client will verbalize understanding of depression symptoms management.  Interventions: . LCSW provided counseling support services to client. . Assessed patient's current psychosocial needs . Discussed self care activities: sleep hygiene, basic healthy eating practices . Assessed patient's ability to pay for and self administer medications (receiving delivered pre-filled bubble packs)  . encouraged client to participate in recreational/relaxation activities of choice  Patient Self Care Activities:  . Client socializes with family weekly as scheduled.  . Client contacts [name], step-son, POA, as needed for daily needs/assistance of client. . Client takes medications as prescribed.  Plan:  . LCSW to contact client/POA [name]) in three weeks to discuss client needs related to depression management.  [name] [facility]) to contact LCSW related to psychosocial needs of client . [name] Client to use relaxation techniques of choice to help him manage depression symptoms.  . Client to socialize with others, as able, to help in managing depression symptoms of client.    Follow Up Plan: LCSW to call client or [name] ,POA, in 3 weeks to discuss client management of depression symptoms  The patient verbalized understanding of instructions provided today and declined a print copy of patient instruction materials.   [name].Davonne Baby MSW, LCSW Licensed Clinical Social Worker [facility] [phone]

## 2019-01-03 NOTE — Chronic Care Management (AMB) (Signed)
Chronic Care Management    Clinical Social Work General Follow Up Note  01/03/2019 Name: Don [name] MRN: [id number] DOB: [birth year]  Referred by: PCP, Dr.Joshua A [name] for psychosocial assessment and counseling support  Review of patient status, including review of consultants reports, relevant laboratory and other test results, and collaboration with appropriate care team members and the patient's provider was performed as part of comprehensive patient evaluation and provision of chronic care management services.    Last CCM Appointment: 01/03/2019  Depression screen PHQ 2/9 12/23/2018  Decreased Interest 1  Down, Depressed, Hopeless 1  PHQ - 2 Score 2  Altered sleeping 2  Tired, decreased energy 2  Change in appetite 1  Feeling bad or failure about yourself  1  Trouble concentrating 0  Moving slowly or fidgety/restless 1  Suicidal thoughts 0  PHQ-9 Score 9  Difficult doing work/chores Somewhat difficult  Some recent data might be hidden     GAD 7 : Generalized Anxiety Score 05/25/2016  Nervous, Anxious, on Edge 1  Control/stop worrying 2  Worry too much - different things 2  Trouble relaxing 1  Restless 0  Easily annoyed or irritable 1  Afraid - awful might happen 0  Total GAD 7 Score 7  Anxiety Difficulty Not difficult at all      Goals    . "I don't know if I have a special diet for dialysis" (pt-stated)     Nurse Case Manager Clinical Goal(s): Within the next 30 days patient and/or caregiver will verbalize understanding of any dietary and fluid restriction related to dialysis.   Interventions:  . Discussed current fluid intake with patient and caregiver . Assessed knowledge of any dietary restrictions or instructions . Collaborate with Dr Don [name] about any fluid and dietary restrictions related to dialysis . Educate patient and/or caregiver on diet and fluid intake insturctions  Patient Self Care Activities:  . Prepares own quick meals, mostly in the  microwave . Aware of current fluid volume intake    Goal. "I get sad sometimes because I live alone"     Current Barriers:   Client has transportation needs . Client has some ambulation challenges  Clinical Social Work Clinical Goal(s): Over the next 30 days, client will verbalize understanding of depression symptoms management.  Interventions: . LCSW provided counseling support services to client. . Assessed patient's current psychosocial needs . Discussed self care activities: sleep hygiene, basic healthy eating practices . Assessed patient's ability to pay for and self administer medications (receiving delivered pre-filled bubble packs)  . encouraged client to participate in recreational/relaxation activities of choice  Patient Self Care Activities:  . Client socializes with family weekly as scheduled.  . Client contacts Don [name], step-son, POA, as needed for daily needs/assistance of client. . Client takes medications as prescribed.  Plan:  . LCSW to contact client/POA Don [name]) in three weeks to discuss client needs related to depression management.  Don [name] Don [name]) to contact LCSW related to psychosocial needs of client . [name] Client to use relaxation techniques of choice to help him manage depression symptoms.  . Client to socialize with others, as able, to help in managing depression symptoms of client.     [name] "I need help with getting and fixing my food" (pt-stated)     Nurse Case Manager Clinical Goal(s): In the next 2 weeks, patient and/or caregiver will verbalize understanding of how to obtain and prepare meals.  Interventions:  1. Information given on the Medicare Well Dine  Program [phone] 2. Discussed ability to use the microwave to heat up the packaged meals   Patient Self Care Activities: 1. Caregiver attested that Don [name] is able to use his current microwave and that he is willing to purchase a new one for Don [name] if at some point he is  unable to use it properly  See "Past Updates" for all previously related goals and interventions.    [name] "I need help with transportation to dialysis and other appointments" (pt-stated)     Nurse Case Manager Clinical Goal(s): In the next 30 days patient and caregiver/POA will verbalize understanding of resources for transportation.  Interventions:  1. Recommended [facility] Services in [place] [phone]) as a resource for transportation arrangement  2. Collaborated with Don Nan, LCSW who relayed that [facility] coverage includes a benefit that covers transportation costs to and from appointment six times a year.  Patient Self Care Activities:  2. Stepson and daughter-in-law provide all of his transportation at this time     . "I want to make sure that I'm keeping up with my blood pressure" (pt-stated)     Nurse Case Manager Clinical Goal(s): Within the next 30 days patient and caregiver will verbalize understanding of need to monitor and record blood pressure readings regularly  Interventions:  . Encouraged to continue checking blood pressure at home . Record blood pressure readings in the blue book that I provided and take it with you to all appointments . Call our office with any blood pressure readings that are below 100/50 or higher than 140/90. . Print and provide EMMI handouts on blood pressure measurement and management  Patient Self Care Activities:  . Checks blood pressure on home monitor . Takes medication to manage hypertension     . "I'm worried about falling at home when I'm alone and not being able to get to the telephone" (pt-stated)     Nurse Case Manager Clinical Goal(s): In the next 30 days patient and caregiver will verbalize understanding of ways to decrease his risk for falls in the home and will verbalize understanding of available medical alert systems.  Interventions:  3. Falls assessment performed 4. Education provided to patient and caregiver on fall  prevention 5. Contacted Houston Siren with [place] [phone]) to arrange setup of a home medical alert system 6. Print and provide EMMI handouts on fall prevention    Patient Self Care Activities: 1. Uses a cane 2. Aware of movement limitations and what increases his personal risk for falls. He tries to make a conscious effort to change positions slowly.   See "Past Updates" for previous updates related to this goal.     . Caregiver stated "I need more understanding of his dementia diagnosis. We were told that he has a "touch of dementia." (pt-stated)     Nurse Case Manager Clinical Goal(s): In the next 30 days patient and caregiver will verbalize understanding of dementia diagnosis and management strategies.   Interventions:  7. Addressed general questions regarding dementia diagnosis 8. Addressed impact of dementia and progression on general health and self care capability.   Patient Self Care Activities:  3. Patients lives alone and is able to perform most ADLs on his own at this point. Patient verbalized that he is starting to have some difficulty with meal preparation and using his microwave  4. Caregiver stated that he will review Don Fenner's microwave with him and they can purchase a more user friendly one if necessary 5. Caregiver  will continue to provide assistance as needed and check in on patient regularly     Follow Up Plan: LCSW to call client or Don [name], POA in the next 3 weeks to discuss management of client depression symptoms   [name].Yuko Coventry MSW, LCSW Licensed Clinical Social Worker [facility] [phone]

## 2019-01-08 ENCOUNTER — Telehealth: Payer: Self-pay | Admitting: Family Medicine

## 2019-01-10 ENCOUNTER — Telehealth: Payer: Self-pay | Admitting: Vascular Surgery

## 2019-01-10 NOTE — Telephone Encounter (Signed)
sch appt spk to pt mld ltr 02/26/2019 11am Dialysis Duplex 1pm p/o PA

## 2019-01-10 NOTE — Telephone Encounter (Signed)
-----   Message from Mena Goes, RN sent at 01/09/2019  1:16 PM EST ----- Regarding: APPT 6 WEEKS FROM 2-3  ----- Message ----- From: Angelia Mould, MD Sent: 12/16/2018  12:11 PM EST To: [facility] Charge Pool Subject: charge                                          PROCEDURE:   Second stage basilic vein transposition  SURGEON: Judeth Cornfield. Scot Dock, MD, FACS  ASSIST: Gaye Alken, RNFA  This patient will need a follow-up in 6 weeks with a duplex at that time.  Thank you.

## 2019-01-13 ENCOUNTER — Other Ambulatory Visit: Payer: Self-pay | Admitting: Family Medicine

## 2019-01-13 NOTE — Telephone Encounter (Signed)
Scott and I spoke with [name] on Friday regarding this. We have not arranged any transportation to the grocery store but [employer] does offer a service on certain Fridays where, with advanced notice, they will pick people up and take them to the store and come back for them. This is something that his family can arrange. [name] thinks that someone may have mentioned the service and Waldemar took it to mean that it had been scheduled. He is going to contact [employer] for more information.   [name], RN-BC, BSN Nurse Case Manager [place] [phone]

## 2019-01-23 ENCOUNTER — Ambulatory Visit (INDEPENDENT_AMBULATORY_CARE_PROVIDER_SITE_OTHER): Payer: Medicare HMO | Admitting: Licensed Clinical Social Worker

## 2019-01-23 DIAGNOSIS — N186 End stage renal disease: Secondary | ICD-10-CM | POA: Diagnosis not present

## 2019-01-23 DIAGNOSIS — I10 Essential (primary) hypertension: Secondary | ICD-10-CM

## 2019-01-23 DIAGNOSIS — W19XXXD Unspecified fall, subsequent encounter: Secondary | ICD-10-CM

## 2019-01-23 DIAGNOSIS — J441 Chronic obstructive pulmonary disease with (acute) exacerbation: Secondary | ICD-10-CM

## 2019-01-23 DIAGNOSIS — F339 Major depressive disorder, recurrent, unspecified: Secondary | ICD-10-CM | POA: Diagnosis not present

## 2019-01-23 NOTE — Chronic Care Management (AMB) (Signed)
  Care Management Note   Don [name] is a 78 y.o. year old male who is a primary care patient of Dettinger, Don Kaufmann, MD. The CM team was consult for assistance with chronic disease management and care coordination.   I reached out to Don [name] by phone today.   Review of patient status, including review of consultants reports, relevant laboratory and other test results, and collaboration with appropriate care team members and the patient's provider was performed as part of comprehensive patient evaluation and provision of chronic care management services.  Goals Addressed            This Visit's Progress   . "I get sad sometimes because I live alone" (pt-stated)       Current Barriers:   Client has transportation needs . Client has some ambulation challenges (uses cane to help with ambulation)  Clinical Social Work Clinical Goal(s): Over the next 30 days, client will verbalize understanding of depression symptoms management.  Interventions: . Assessed patient's current psychosocial needs . Discussed self care activities: sleep hygiene, basic healthy eating practices . Assessed patient's ability to pay for and self administer medications (receiving delivered pre-filled bubble packs)  . Encouraged client to participate in recreational/relaxation activities of choice  . Encouraged client or POA to call LCSW as needed to discuss management of depression symptoms of client  Patient Self Care Activities:  . Client socializes with family weekly as scheduled.  . Client contacts Don [name], step-son, POA, as needed for daily needs/assistance of client. . Client takes medications as prescribed.  Plan:  . LCSW to contact client/POA Don [name]) in three weeks to discuss client needs related to depression management.  Don [name] [facility]) to contact LCSW related to psychosocial needs of client . [name] Client to use relaxation techniques of choice to help him manage depression  symptoms.  . Client to attend scheduled medical appointments . Client or POA Don [name] to communicate with RN CM as needed to discuss nursing needs of client     Follow Up Plan: LCSW to contact client/POA Don [name] in 3 weeks to discuss client needs related to depression management.   Don [name].Don [name] MSW, LCSW Licensed Clinical Social Worker [facility] Care Management [phone]

## 2019-01-23 NOTE — Patient Instructions (Signed)
Licensed Clinical Social Worker Visit Information  Goals we discussed today:  Goals Addressed            This Visit's Progress   . "I get sad sometimes because I live alone" (pt-stated)       Current Barriers:   Client has transportation needs . Client has some ambulation challenges (uses cane to help with ambulation)  Clinical Social Work Clinical Goal(s): Over the next 30 days, client will verbalize understanding of depression symptoms management.  Interventions: . Assessed patient's current psychosocial needs . Discussed self care activities: sleep hygiene, basic healthy eating practices . Assessed patient's ability to pay for and self administer medications (receiving delivered pre-filled bubble packs)  . encouraged client to participate in recreational/relaxation activities of choice  . Encouraged client or POA to call LCSW as needed to discuss management of depression symptoms of client  Patient Self Care Activities:  . Client socializes with family weekly as scheduled.  . Client contacts [name], step-son, POA, as needed for daily needs/assistance of client. . Client takes medications as prescribed.  Plan:  . LCSW to contact client/POA [name]) in three weeks to discuss client needs related to depression management.  [name] [facility]) to contact LCSW related to psychosocial needs of client . [name] Client to use relaxation techniques of choice to help him manage depression symptoms.  . Client to attend scheduled medical appointments . Client or POA [name] to communicate with RN CM as needed to discuss nursing needs of client       Materials Provided: No  Follow Up Plan: LCSW to contact client/POA [name] in 3 weeks to discuss client needs related to depression management  The patient verbalized understanding of instructions provided today and declined a print copy of patient instruction materials.   [name].Zahra Peffley MSW, LCSW Licensed Clinical  Social Worker [facility] Care Management [phone]

## 2019-02-13 ENCOUNTER — Ambulatory Visit (INDEPENDENT_AMBULATORY_CARE_PROVIDER_SITE_OTHER): Payer: Medicare HMO | Admitting: Licensed Clinical Social Worker

## 2019-02-13 DIAGNOSIS — I10 Essential (primary) hypertension: Secondary | ICD-10-CM

## 2019-02-13 DIAGNOSIS — R296 Repeated falls: Secondary | ICD-10-CM

## 2019-02-13 DIAGNOSIS — F339 Major depressive disorder, recurrent, unspecified: Secondary | ICD-10-CM

## 2019-02-13 DIAGNOSIS — J439 Emphysema, unspecified: Secondary | ICD-10-CM

## 2019-02-13 DIAGNOSIS — N186 End stage renal disease: Secondary | ICD-10-CM

## 2019-02-13 DIAGNOSIS — W19XXXD Unspecified fall, subsequent encounter: Secondary | ICD-10-CM

## 2019-02-13 NOTE — Patient Instructions (Signed)
Licensed Clinical Social Worker Visit Information  Goals we discussed today:  Goals Addressed            This Visit's Progress   . "I get sad sometimes because I live alone" (pt-stated)       Current Barriers:   Client has transportation needs . Client has some ambulation challenges (uses cane to help with ambulation)  Clinical Social Work Clinical Goal(s): Over the next 30 days, client will verbalize understanding of depression symptoms management.  Interventions: . Discussed self care activities: sleep hygiene, basic healthy eating practices . Encouraged client to participate in recreational/relaxation activities of choice  . Encouraged client or POA to call LCSW as needed to discuss management of depression symptoms of client  Patient Self Care Activities:  . Client socializes with family weekly as scheduled.  . Client contacts [name], step-son, POA, as needed for daily needs/assistance of client. . Client takes medications as prescribed.  Plan:  . LCSW to contact client/POA [name]) in three weeks to discuss client needs related to depression management.  [name] [facility]) to contact LCSW related to psychosocial needs of client . [name] Client to use relaxation techniques of choice to help him manage depression symptoms.  . Client to attend scheduled medical appointments  .  Client or POA [name] to communicate with RN CM as needed to discuss nursing needs of client       Materials Provided: No  Follow Up Plan: LCSW to contact client/POA [name] in next 3 weeks to discuss client needs related to depression management  The patient verbalized understanding of instructions provided today and declined a print copy of patient instruction materials.   [name].Angeles Zehner MSW, LCSW Licensed Clinical Social Worker [facility] Care Management [phone]

## 2019-02-13 NOTE — Chronic Care Management (AMB) (Signed)
  Care Management Note   Don [name] is a 78 y.o. year old male who is a primary care patient of Dettinger, Fransisca Kaufmann, MD. The CM team was consulted for assistance with chronic disease management and care coordination.   I reached out to Don [name] by phone today.   Review of patient status, including review of consultants reports, relevant laboratory and other test results, and collaboration with appropriate care team members and the patient's provider was performed as part of comprehensive patient evaluation and provision of chronic care management services.   Social Determinants of Health:At risk for Depression; At risk for Social Isolation. Transportation challenges occasionally  Depression screen PHQ 2/9 12/23/2018  Decreased Interest 1  Down, Depressed, Hopeless 1  PHQ - 2 Score 2  Altered sleeping 2  Tired, decreased energy 2  Change in appetite 1  Feeling bad or failure about yourself  1  Trouble concentrating 0  Moving slowly or fidgety/restless 1  Suicidal thoughts 0  PHQ-9 Score 9  Difficult doing work/chores Somewhat difficult  Some recent data might be hidden   Goals Addressed            This Visit's Progress   . "I get sad sometimes because I live alone" (pt-stated)       Current Barriers:   Client has transportation needs . Client has some ambulation challenges (uses cane to help with ambulation)  Clinical Social Work Clinical Goal(s): Over the next 30 days, client will verbalize understanding of depression symptoms management.  Interventions: . Discussed self care activities: sleep hygiene, basic healthy eating practices . Encouraged client to participate in recreational/relaxation activities of choice   .  Encouraged client or POA to call LCSW as needed to discuss management of depression symptoms of client  . Talked with client about social interaction  Patient Self Care Activities:  . Client socializes with family weekly as scheduled.  . Client  contacts Don [name], step-son, POA, as needed for daily needs/assistance of client. . Client takes medications as prescribed.  Plan:  . LCSW to contact client/POA Don [name]) in three weeks to discuss client needs related to depression management.  Don [name] [name] to contact LCSW related to psychosocial needs of client . [name] Client to use relaxation techniques of choice to help him manage depression symptoms.  . Client to attend scheduled medical appointments . Client or POA Don [name] to communicate with RN CM as needed to discuss nursing needs of client    Follow Up Plan: LCSW to contact client/POA Don [name] in next 3 weeks to discuss client needs related to depression management  Don [name].Don [name] MSW, LCSW Licensed Clinical Social Worker [name] [phone]

## 2019-02-17 ENCOUNTER — Other Ambulatory Visit: Payer: Self-pay

## 2019-02-17 DIAGNOSIS — N186 End stage renal disease: Secondary | ICD-10-CM

## 2019-02-17 DIAGNOSIS — Z992 Dependence on renal dialysis: Secondary | ICD-10-CM

## 2019-02-25 ENCOUNTER — Telehealth: Payer: Self-pay | Admitting: Family Medicine

## 2019-02-25 ENCOUNTER — Other Ambulatory Visit: Payer: Self-pay

## 2019-02-25 ENCOUNTER — Ambulatory Visit: Payer: Medicare HMO | Admitting: *Deleted

## 2019-02-25 DIAGNOSIS — N186 End stage renal disease: Secondary | ICD-10-CM | POA: Diagnosis not present

## 2019-02-25 DIAGNOSIS — R5381 Other malaise: Secondary | ICD-10-CM | POA: Diagnosis not present

## 2019-02-25 DIAGNOSIS — Z992 Dependence on renal dialysis: Secondary | ICD-10-CM | POA: Insufficient documentation

## 2019-02-25 DIAGNOSIS — Z7901 Long term (current) use of anticoagulants: Secondary | ICD-10-CM | POA: Diagnosis not present

## 2019-02-25 DIAGNOSIS — Z87891 Personal history of nicotine dependence: Secondary | ICD-10-CM | POA: Diagnosis not present

## 2019-02-25 DIAGNOSIS — I120 Hypertensive chronic kidney disease with stage 5 chronic kidney disease or end stage renal disease: Secondary | ICD-10-CM | POA: Insufficient documentation

## 2019-02-25 DIAGNOSIS — I959 Hypotension, unspecified: Secondary | ICD-10-CM | POA: Diagnosis not present

## 2019-02-25 DIAGNOSIS — R55 Syncope and collapse: Secondary | ICD-10-CM | POA: Insufficient documentation

## 2019-02-25 DIAGNOSIS — I4891 Unspecified atrial fibrillation: Secondary | ICD-10-CM | POA: Insufficient documentation

## 2019-02-25 DIAGNOSIS — R531 Weakness: Secondary | ICD-10-CM | POA: Insufficient documentation

## 2019-02-25 DIAGNOSIS — J449 Chronic obstructive pulmonary disease, unspecified: Secondary | ICD-10-CM | POA: Diagnosis not present

## 2019-02-25 DIAGNOSIS — Z79899 Other long term (current) drug therapy: Secondary | ICD-10-CM | POA: Diagnosis not present

## 2019-02-25 NOTE — Chronic Care Management (AMB) (Signed)
  Chronic Care Management   RN Case Manager Telephone Follow-up Note  02/25/2019 Name: Don [name] MRN: [id number] DOB: 07/06/1941  My attempt to reach [name] by telephone today was unsuccesfful. I did leave a voicemail for him to return my call. I would like to discuss any new nursing care needs he may have and follow-up on previously discussed needs.    Follow up plan:  The CM team will reach out to the patient again over the next 7 days.    I will reach out to [name] [facility]) over the next 7 days if [name] does not return my call.    [name], RN-BC, BSN Nurse Case Manager [place] [phone]

## 2019-02-26 ENCOUNTER — Telehealth (HOSPITAL_COMMUNITY): Payer: Self-pay | Admitting: Rehabilitation

## 2019-02-26 ENCOUNTER — Emergency Department (HOSPITAL_COMMUNITY): Payer: Medicare HMO

## 2019-02-26 ENCOUNTER — Encounter (HOSPITAL_COMMUNITY): Payer: Non-veteran care

## 2019-02-26 ENCOUNTER — Encounter (HOSPITAL_COMMUNITY): Payer: Self-pay | Admitting: Emergency Medicine

## 2019-02-26 ENCOUNTER — Emergency Department (HOSPITAL_COMMUNITY)
Admission: EM | Admit: 2019-02-26 | Discharge: 2019-02-26 | Disposition: A | Discharge status: Home / Self Care | Payer: Medicare HMO | Attending: Emergency Medicine | Admitting: Emergency Medicine

## 2019-02-26 ENCOUNTER — Other Ambulatory Visit: Payer: Self-pay

## 2019-02-26 DIAGNOSIS — R55 Syncope and collapse: Secondary | ICD-10-CM

## 2019-02-26 DIAGNOSIS — R531 Weakness: Secondary | ICD-10-CM

## 2019-02-26 LAB — CBC WITH DIFFERENTIAL/PLATELET
Abs Immature Granulocytes: 0.1 10*3/uL — ABNORMAL HIGH (ref 0.00–0.07)
Basophils Absolute: 0 10*3/uL (ref 0.0–0.1)
Basophils Relative: 0 %
Eosinophils Absolute: 0.2 10*3/uL (ref 0.0–0.5)
Eosinophils Relative: 2 %
HCT: 43 % (ref 39.0–52.0)
Hemoglobin: 13.8 g/dL (ref 13.0–17.0)
Immature Granulocytes: 1 %
Lymphocytes Relative: 17 %
Lymphs Abs: 1.5 10*3/uL (ref 0.7–4.0)
MCH: 32.9 pg (ref 26.0–34.0)
MCHC: 32.1 g/dL (ref 30.0–36.0)
MCV: 102.4 fL — ABNORMAL HIGH (ref 80.0–100.0)
Monocytes Absolute: 0.8 10*3/uL (ref 0.1–1.0)
Monocytes Relative: 9 %
Neutro Abs: 6.2 10*3/uL (ref 1.7–7.7)
Neutrophils Relative %: 71 %
Platelets: 196 10*3/uL (ref 150–400)
RBC: 4.2 MIL/uL — ABNORMAL LOW (ref 4.22–5.81)
RDW: 17.1 % — ABNORMAL HIGH (ref 11.5–15.5)
WBC: 8.9 10*3/uL (ref 4.0–10.5)
nRBC: 0 % (ref 0.0–0.2)

## 2019-02-26 LAB — URINALYSIS, ROUTINE W REFLEX MICROSCOPIC
Bilirubin Urine: NEGATIVE
Glucose, UA: NEGATIVE mg/dL
Hgb urine dipstick: NEGATIVE
Ketones, ur: NEGATIVE mg/dL
Nitrite: NEGATIVE
Protein, ur: 30 mg/dL — AB
Specific Gravity, Urine: 1.017 (ref 1.005–1.030)
pH: 5 (ref 5.0–8.0)

## 2019-02-26 LAB — PROTIME-INR
INR: 1.6 — ABNORMAL HIGH (ref 0.8–1.2)
Prothrombin Time: 18.8 seconds — ABNORMAL HIGH (ref 11.4–15.2)

## 2019-02-26 LAB — COMPREHENSIVE METABOLIC PANEL
ALT: 15 U/L (ref 0–44)
AST: 15 U/L (ref 15–41)
Albumin: 3.5 g/dL (ref 3.5–5.0)
Alkaline Phosphatase: 108 U/L (ref 38–126)
Anion gap: 15 (ref 5–15)
BUN: 42 mg/dL — ABNORMAL HIGH (ref 8–23)
CO2: 24 mmol/L (ref 22–32)
Calcium: 8.8 mg/dL — ABNORMAL LOW (ref 8.9–10.3)
Chloride: 101 mmol/L (ref 98–111)
Creatinine, Ser: 5.04 mg/dL — ABNORMAL HIGH (ref 0.61–1.24)
GFR calc Af Amer: 12 mL/min — ABNORMAL LOW (ref >60)
GFR calc non Af Amer: 10 mL/min — ABNORMAL LOW (ref >60)
Glucose, Bld: 107 mg/dL — ABNORMAL HIGH (ref 70–99)
Potassium: 4 mmol/L (ref 3.5–5.1)
Sodium: 140 mmol/L (ref 135–145)
Total Bilirubin: 0.7 mg/dL (ref 0.3–1.2)
Total Protein: 6.7 g/dL (ref 6.5–8.1)

## 2019-02-26 LAB — TROPONIN I
Troponin I: <0.03 ng/mL (ref <0.03)
Troponin I: <0.03 ng/mL (ref <0.03)

## 2019-02-26 LAB — LACTIC ACID, PLASMA
Lactic Acid, Venous: 2.1 mmol/L (ref 0.5–1.9)
Lactic Acid, Venous: 0.9 mmol/L (ref 0.5–1.9)

## 2019-02-26 MED ORDER — SODIUM CHLORIDE 0.9 % IV BOLUS
500.0000 mL | Freq: Once | INTRAVENOUS | Status: AC
  Administered 2019-02-26: 500 mL via INTRAVENOUS

## 2019-02-26 MED ORDER — FLUCONAZOLE 100 MG PO TABS
100.0000 mg | ORAL_TABLET | Freq: Once | ORAL | Status: AC
  Administered 2019-02-26: 100 mg via ORAL
  Filled 2019-02-26: qty 1

## 2019-02-26 NOTE — ED Triage Notes (Signed)
Pt reports more weakness that usual starting today after dialysis, pt given 250NS en route

## 2019-02-26 NOTE — ED Provider Notes (Signed)
[facility] EMERGENCY DEPARTMENT Provider Note   CSN: [id number] Arrival date & time: 02/25/19  2356    History   Chief Complaint No chief complaint on file.   HPI Don [name] is a 78 y.o. male.     Patient with history of ESRD on dialysis, COPD, atrial fibrillation on Eliquis, prostate cancer presenting from home with generalized weakness, dizziness, lightheadedness and feeling poorly after dialysis this afternoon.  States he felt well all day and was felt well immediately after dialysis.  He then states he began to feel lightheaded and weak all over.  He did not fall or hit his head.  No chest pain or shortness of breath.  EMS reports initial blood pressure was in the 46N systolic which improved with IV fluids.  Patient states he feels somewhat better but still feels lightheaded.  Denies any chest pain, shortness of breath, abdominal pain, nausea or vomiting.  No blood in the stool.  No difficulty speaking or difficulty swallowing.  Patient states he sneezed into his mask while at dialysis and coughed once.  He believes this caused another patient to cut his session short because that other patient was concerned that the patient was sick.  Patient states he returned home and found a message on his machine from his primary doctor to check and see how he was.  He attempted to call them but was too late and they were closed.  He states he lives by himself and became concerned he did not know what was going on and felt dizzy and nauseated and lightheaded which is what led to him calling EMS.  The history is provided by the patient and the EMS personnel.    Past Medical History:  Diagnosis Date   Acute bronchitis 04/03/2014   Anemia    low iron   Anxiety    Atrial fibrillation (HCC)    Cataract    COPD (chronic obstructive pulmonary disease) ([place])    Depression    Essential hypertension    Hyperlipidemia    Macular degeneration    Noncompliance with medications 04/2013    Xarelto, digoxin previously   Pre-diabetes    Prostate cancer ([place])    Stage III chronic kidney disease 04/03/2014   Stage 5 Dialysis on T/Th/Sa   Tubular adenoma of colon 07/31/02, 11/18/03    Patient Active Problem List   Diagnosis Date Noted   ESRD (end stage renal disease) ([place]) 10/30/2018   COPD with acute exacerbation ([place]) 10/30/2018   Acute on chronic diastolic CHF (congestive heart failure) ([place]) 10/30/2018   Hemodialysis patient ([place])    Acute renal failure superimposed on stage 5 chronic kidney disease, not on chronic dialysis ([place])    Avulsion of skin of right forearm    Fall    Palliative care by specialist    DNR (do not resuscitate) discussion    Atrial fibrillation with RVR ([place]) 03/18/2018   Mass of lower lobe of left lung 02/12/2018   Depression, recurrent ([place]) 01/02/2018   Acute on chronic anemia    Normocytic anemia    Coagulopathy ([place]) 01/31/2017   Pancytopenia ([place]) 01/31/2017   Pre-diabetes 62/95/2841   Metabolic syndrome 32/44/0102   Urinary bladder incontinence 03/04/2015   Hyperlipidemia LDL goal <130 04/04/2014   Personal history of colonic polyps 04/04/2014   Heme positive stool 04/03/2014   History of prostate cancer 04/03/2014   Tobacco abuse 04/25/2013   Prostate cancer ([place]) 02/08/2011   COPD (chronic obstructive pulmonary disease) ([place])  02/08/2011   Hypertension 02/08/2011    Past Surgical History:  Procedure Laterality Date   Anal abcess,Hemorroids,     BASCILIC VEIN TRANSPOSITION Left 10/08/2018   Procedure: FIRST STAGE BASILIC VEIN TRANSPOSITION LEFT ARM;  Surgeon: Angelia Mould, MD;  Location: [facility];  Service: Vascular;  Laterality: Left;   [place] Left 12/16/2018   Procedure: BASILIC VEIN TRANSPOSITION SECOND STAGE;  Surgeon: Angelia Mould, MD;  Location: [facility];  Service: Vascular;  Laterality: Left;   COLONOSCOPY N/A 04/05/2014   Dr. Fuller Plan: 6 mm sessile polyp  from sigmoid colon (tubular adenoma), internal hemorrhoids   COLONOSCOPY W/ BIOPSIES  11/18/2003   Dr. Earle Gell   ESOPHAGOGASTRODUODENOSCOPY  11/18/2003   Dr. Earle Gell   ESOPHAGOGASTRODUODENOSCOPY N/A 04/05/2014   Dr. Fuller Plan: variable Z line, negative Barrett's, small hiatal hernia   ESOPHAGOGASTRODUODENOSCOPY N/A 02/02/2017   Dr. Oneida Alar: LA Grade B esophagitis, one moderate benign-appearing intrinsic stenosis traversed, small non-bleeding diverticulum in second portion of duodenum, reactive gastropathy, no H.pylori.   ESOPHAGOGASTRODUODENOSCOPY N/A 03/22/2018   Procedure: ESOPHAGOGASTRODUODENOSCOPY (EGD);  Surgeon: Daneil Dolin, MD;  [facility];  Service: Endoscopy;  Laterality: N/A;   GIVENS CAPSULE STUDY N/A 03/22/2018   Procedure: GIVENS CAPSULE STUDY;  Surgeon: Daneil Dolin, MD;  [facility];  Service: Endoscopy;  Laterality: N/A;   IR FLUORO GUIDE CV LINE RIGHT  10/29/2018   IR US GUIDE VASC ACCESS RIGHT  10/29/2018   RETROPUBIC PROSTATECTOMY  11/26/2001   TONSILLECTOMY          Home Medications    Prior to Admission medications   Medication Sig Start Date End Date Taking? Authorizing Provider  acetaminophen (TYLENOL) 500 MG tablet Take 500 mg by mouth every 6 (six) hours as needed for moderate pain.     [provider]  albuterol (PROAIR HFA) 108 (90 Base) MCG/ACT inhaler Inhale 2 puffs into the lungs 2 (two) times daily.     [provider]  apixaban (ELIQUIS) 5 MG TABS tablet Take 1 tablet (5 mg total) by mouth 2 (two) times daily. (Needs to be seen) Patient taking differently: Take 5 mg by mouth 2 (two) times daily.  08/16/18   Dettinger, Fransisca Kaufmann, MD  atorvastatin (LIPITOR) 20 MG tablet Take 1 tablet (20 mg total) by mouth daily. (Needs to be seen) Patient taking differently: Take 20 mg by mouth at bedtime.  08/16/18   Dettinger, Fransisca Kaufmann, MD  bisoprolol (ZEBETA) 10 MG tablet TAKE (1) TABLET TWICE A  DAY. Patient taking differently: Take 10 mg by mouth every morning.  10/18/18   Dettinger, Fransisca Kaufmann, MD  budesonide-formoterol (SYMBICORT) 160-4.5 MCG/ACT inhaler Inhale 2 puffs into the lungs 2 (two) times daily.    [provider]  calcitRIOL (ROCALTROL) 0.25 MCG capsule Take 1 capsule (0.25 mcg total) by mouth every morning. 11/18/18   Dettinger, Fransisca Kaufmann, MD  clonazePAM (KLONOPIN) 1 MG tablet Take 0.5 tablets (0.5 mg total) by mouth at bedtime. 01/01/19   Dettinger, Fransisca Kaufmann, MD  diltiazem (CARDIZEM CD) 240 MG 24 hr capsule TAKE (1) CAPSULE DAILY Patient taking differently: Take 240 mg by mouth every morning.  08/16/18   Dettinger, Fransisca Kaufmann, MD  ferrous sulfate 325 (65 FE) MG tablet Take 325 mg by mouth every Monday, Wednesday, and Friday.    [provider]  fluticasone (FLONASE) 50 MCG/ACT nasal spray Place 2 sprays into both nostrils daily. 11/25/18   Janora Norlander, DO  hydroxypropyl methylcellulose / hypromellose (ISOPTO TEARS / GONIOVISC) 2.5 % ophthalmic solution Place 1 drop into both eyes daily as needed for dry eyes.    [provider]  iron polysaccharides (FERREX 150) 150 MG capsule Take 1 capsule (150 mg total) by mouth daily. (Needs to be seen) Patient taking differently: Take 150 mg by mouth every morning. (Needs to be seen) 08/16/18   Dettinger, Fransisca Kaufmann, MD  loratadine (CLARITIN) 10 MG tablet TAKE 1 TABLET DAILY 01/13/19   Dettinger, Fransisca Kaufmann, MD  Melatonin 3 MG TABS Take 3 mg by mouth at bedtime.     [provider]  Multiple Vitamin (MULTIVITAMIN WITH MINERALS) TABS tablet Take 1 tablet by mouth at bedtime.     [provider]  Multiple Vitamins-Minerals (PRESERVISION AREDS 2 PO) Take 1 tablet by mouth 2 (two) times daily.     [provider]  multivitamin (RENA-VIT) TABS tablet Take 1 tablet by mouth at bedtime. Patient not taking: Reported on 12/16/2018 10/31/18   Tat, Shanon Brow, MD  nicotine (NICODERM CQ - DOSED IN MG/24 HOURS)  14 mg/24hr patch Place 1 patch (14 mg total) onto the skin daily. Patient not taking: Reported on 12/16/2018 11/18/18   Dettinger, Fransisca Kaufmann, MD  Nutritional Supplements (FEEDING SUPPLEMENT, NEPRO CARB STEADY,) LIQD Take 237 mLs by mouth 2 (two) times daily between meals. 10/31/18   Orson Eva, MD  omega-3 acid ethyl esters (LOVAZA) 1 g capsule Take 2 g by mouth 2 (two) times daily.    [provider]  oxyCODONE (ROXICODONE) 5 MG immediate release tablet Take 1 tablet (5 mg total) by mouth every 4 (four) hours as needed. 12/16/18   Angelia Mould, MD  pantoprazole (PROTONIX) 40 MG tablet TAKE (1) TABLET TWICE A DAY. Patient taking differently: Take 40 mg by mouth 2 (two) times daily.  09/12/18   Dettinger, Fransisca Kaufmann, MD  terazosin (HYTRIN) 2 MG capsule TAKE 1 CAPSULE AT BEDTIME Patient taking differently: Take 2 mg by mouth at bedtime.  05/20/18   Dettinger, Fransisca Kaufmann, MD  tiotropium (SPIRIVA) 18 MCG inhalation capsule Place 18 mcg into inhaler and inhale daily as needed (for shortness of breath).     [provider]    Family History Family History  Problem Relation Age of Onset   Diabetes Father    Heart disease Father 43       MI   Heart attack Father    Heart attack Mother    Heart disease Brother    Cancer Brother        lung   Lung cancer Brother    Heart disease Brother    Lung cancer Sister    Cancer Sister        lung   Heart disease Brother    Heart disease Brother     Social History Social History   Tobacco Use   Smoking status: Former Smoker    Packs/day: 0.50    Years: 62.00    Pack years: 31.00    Types: Cigarettes, Cigars    Last attempt to quit: 10/27/2018    Years since quitting: 0.3   Smokeless tobacco: Never Used   Tobacco comment: "very little"  Substance Use Topics   Alcohol use: No   Drug use: No     Allergies   Wellbutrin [bupropion]   Review of Systems Review of Systems  Constitutional: Positive for  activity change, appetite change and fatigue. Negative for fever.  HENT: Negative for congestion  and rhinorrhea.   Respiratory: Negative for cough.   Cardiovascular: Negative for chest pain.  Gastrointestinal: Negative for abdominal pain, nausea and vomiting.  Genitourinary: Negative for dysuria, hematuria, scrotal swelling and urgency.  Musculoskeletal: Negative for arthralgias and myalgias.  Skin: Negative for rash.  Neurological: Positive for dizziness, syncope, weakness and light-headedness.    all other systems are negative except as noted in the HPI and PMH.    Physical Exam Updated Vital Signs BP 102/64 (BP Location: Right Arm)    Pulse 82    Temp 97.9 F (36.6 C) (Oral)    Resp 16    Ht 5\' 9"  (1.753 m)    Wt 68 kg    SpO2 98%    BMI 22.15 kg/m   Physical Exam Vitals signs and nursing note reviewed.  Constitutional:      General: He is not in acute distress.    Appearance: He is well-developed.  HENT:     Head: Normocephalic and atraumatic.     Mouth/Throat:     Mouth: Mucous membranes are dry.     Pharynx: No oropharyngeal exudate.     Comments: Mildly dry mucous membranes Eyes:     Conjunctiva/sclera: Conjunctivae normal.     Pupils: Pupils are equal, round, and reactive to light.  Neck:     Musculoskeletal: Normal range of motion and neck supple.     Comments: No meningismus. Cardiovascular:     Rate and Rhythm: Normal rate. Rhythm irregular.     Heart sounds: Normal heart sounds. No murmur.  Pulmonary:     Effort: Pulmonary effort is normal. No respiratory distress.     Breath sounds: Normal breath sounds.     Comments: Dialysis catheter right chest Abdominal:     Palpations: Abdomen is soft.     Tenderness: There is no abdominal tenderness. There is no guarding or rebound.  Musculoskeletal: Normal range of motion.        General: No tenderness.     Comments: Fistula left upper extremity with positive thrill  Skin:    General: Skin is warm.     Capillary  Refill: Capillary refill takes less than 2 seconds.  Neurological:     General: No focal deficit present.     Mental Status: He is alert and oriented to person, place, and time. Mental status is at baseline.     Cranial Nerves: No cranial nerve deficit.     Motor: No abnormal muscle tone.     Coordination: Coordination normal.     Comments: No ataxia on finger to nose bilaterally. No pronator drift. 5/5 strength throughout. CN 2-12 intact.Equal grip strength. Sensation intact.   Psychiatric:        Behavior: Behavior normal.      ED Treatments / Results  Labs (all labs ordered are listed, but only abnormal results are displayed) Labs Reviewed  LACTIC ACID, PLASMA - Abnormal; Notable for the following components:      Result Value   Lactic Acid, Venous 2.1 (*)    All other components within normal limits  CBC WITH DIFFERENTIAL/PLATELET - Abnormal; Notable for the following components:   RBC 4.20 (*)    MCV 102.4 (*)    RDW 17.1 (*)    Abs Immature Granulocytes 0.10 (*)    All other components within normal limits  COMPREHENSIVE METABOLIC PANEL - Abnormal; Notable for the following components:   Glucose, Bld 107 (*)    BUN 42 (*)    Creatinine,  Ser 5.04 (*)    Calcium 8.8 (*)    GFR calc non Af Amer 10 (*)    GFR calc Af Amer 12 (*)    All other components within normal limits  URINALYSIS, ROUTINE W REFLEX MICROSCOPIC - Abnormal; Notable for the following components:   Color, Urine AMBER (*)    APPearance HAZY (*)    Protein, ur 30 (*)    Leukocytes,Ua SMALL (*)    Bacteria, UA RARE (*)    All other components within normal limits  PROTIME-INR - Abnormal; Notable for the following components:   Prothrombin Time 18.8 (*)    INR 1.6 (*)    All other components within normal limits  CULTURE, BLOOD (ROUTINE X 2)  CULTURE, BLOOD (ROUTINE X 2)  URINE CULTURE  LACTIC ACID, PLASMA  TROPONIN I  TROPONIN I  POC OCCULT BLOOD, ED    EKG EKG  Interpretation  Date/Time:  Wednesday February 26 2019 00:58:15 EDT Ventricular Rate:  93 PR Interval:    QRS Duration: 100 QT Interval:  351 QTC Calculation: 400 R Axis:   90 Text Interpretation:  Atrial fibrillation Borderline right axis deviation Low voltage, extremity leads No significant change was found Confirmed by Ezequiel Essex [phone]) on 02/26/2019 1:10:06 AM   Radiology Dg Chest Portable 1 View  Result Date: 02/26/2019 CLINICAL DATA:  Weakness following dialysis EXAM: PORTABLE CHEST 1 VIEW COMPARISON:  11/25/2018 FINDINGS: Cardiac shadow is stable. Dialysis catheter is again noted on the right. Previously seen patchy infiltrates have resolved. No bony abnormality is noted. IMPRESSION: Resolution of previously seen patchy infiltrates. No acute abnormality noted. Electronically Signed   By: Inez Catalina M.D.   On: 02/26/2019 01:56   Dg Abd Portable 2 Views  Result Date: 02/26/2019 CLINICAL DATA:  Weakness EXAM: PORTABLE ABDOMEN - 2 VIEW COMPARISON:  None. FINDINGS: Scattered large and small bowel gas is noted. Retained fecal material is noted consistent with mild constipation. Cholelithiasis is noted in the right upper quadrant. No acute bony abnormality is seen. No free air is noted. IMPRESSION: Changes of mild constipation. Cholelithiasis. Electronically Signed   By: Inez Catalina M.D.   On: 02/26/2019 01:57    Procedures Procedures (including critical care time)  Medications Ordered in ED Medications - No data to display   Initial Impression / Assessment and Plan / ED Course  I have reviewed the triage vital signs and the nursing notes.  Pertinent labs & imaging results that were available during my care of the patient were reviewed by me and considered in my medical decision making (see chart for details).       Generalized weakness with lightheadedness after dialysis.  No fever. No focal neuro deficits.   Orthostatics positive by heart rate.  Gentle fluids  given.  Labs with improved hemoglobin.  Troponin negative.  Potassium is normal. FOBT negative. Chest x-ray shows no acute infiltrates.  Neurological exam is nonfocal.  Patient denies any head, neck, chest, abdominal pain or back pain.  Blood pressure has improved with some IV fluids.  EKG is unchanged. Patient is tolerating p.o. and ambulatory.  Troponin negative x2.  Low suspicion for ACS.  CT head is negative.  His neurological exam is nonfocal and he is able to ambulate without dizziness. Lactate has cleared. No fever.   Suspect transient hypotension after dialysis causing his dizziness and weakness. Advised him to ask his nephrologist to possibly reduce his fluid removal at dialysis.  Discussed with patient that he has  no cough or fever or shortness of breath to suggest coronavirus though given the pandemic this cannot be completely excluded and he should continue to follow social distancing and home quarantine. Followup with PCP. Return precautions discussed.    Don [name] was evaluated in Emergency Department on 02/26/2019 for the symptoms described in the history of present illness. He was evaluated in the context of the global COVID-19 pandemic, which necessitated consideration that the patient might be at risk for infection with the SARS-CoV-2 virus that causes COVID-19. Institutional protocols and algorithms that pertain to the evaluation of patients at risk for COVID-19 are in a state of rapid change based on information released by regulatory bodies including the CDC and federal and state organizations. These policies and algorithms were followed during the patient's care in the ED.  Final Clinical Impressions(s) / ED Diagnoses   Final diagnoses:  Weakness  Near syncope    ED Discharge Orders    None       Kristina Bertone, Annie Main, MD 02/26/19 [phone]

## 2019-02-26 NOTE — Telephone Encounter (Signed)
The above patient or their representative was contacted and gave the following answers to these questions:         Do you have any of the following symptoms? No  Fever                    Cough                   Shortness of breath  Do  you have any of the following other symptoms? No   muscle pain         vomiting,        diarrhea        rash         weakness        red eye        abdominal pain         bruising          bruising or bleeding              joint pain           severe headache    Have you been in contact with someone who was or has been sick in the past 2 weeks? No  Yes                 Unsure                         Unable to assess   Does the person that you were in contact with have any of the following symptoms? n/a  Cough         shortness of breath           muscle pain         vomiting,            diarrhea            rash            weakness           fever            red eye           abdominal pain           bruising  or  bleeding                joint pain                severe headache               Have you  or someone you have been in contact with traveled internationally in th last month? No        If yes, which countries? n/a   Have you  or someone you have been in contact with traveled outside [place] in th last month?  No       If yes, which state and city? n/a   COMMENTS OR ACTION PLAN FOR THIS PATIENT:

## 2019-02-26 NOTE — ED Notes (Signed)
Date and time results received: 02/26/19 0126   Test: Lactic Critical Value: 2.1  Name of Provider Notified: Rancour  Orders Received? Or Actions Taken?: n/a

## 2019-02-26 NOTE — Discharge Instructions (Addendum)
Keep yourself hydrated.  Discuss with your kidney doctor possibly removing less fluid with dialysis.  Follow-up with your doctor.  Return to the ED with chest pain, shortness of breath, any other concerns.

## 2019-02-26 NOTE — ED Notes (Signed)
Son arrived to pt up pt.

## 2019-02-26 NOTE — ED Notes (Signed)
Meal tray and decaf coffee given, Sprite previously given

## 2019-02-26 NOTE — ED Notes (Signed)
Pt. Ambulated with no difficulties.

## 2019-02-26 NOTE — Telephone Encounter (Signed)
Received call from [facility] lab with positive blood culture results; gram positive cocci; Dr Erasmo Downer Ward aware and requested patient be notified and instructed to return to [facility] ED for follow up on labs.   Called patient and received voicemail; left a message on patient's voicemail instructing him to report to [facility] ED for reeval due to positive blood cultures.  Called [name], patient's POA, spoke with him and instructed him of the same recommendations. Patient's POA, bruce, verbalized understanding. No further questions or concerns voiced.

## 2019-02-27 ENCOUNTER — Ambulatory Visit (HOSPITAL_COMMUNITY): Admission: RE | Admit: 2019-02-27 | Payer: Non-veteran care | Source: Ambulatory Visit

## 2019-02-27 ENCOUNTER — Telehealth: Payer: Self-pay | Admitting: Family Medicine

## 2019-02-27 ENCOUNTER — Telehealth (HOSPITAL_BASED_OUTPATIENT_CLINIC_OR_DEPARTMENT_OTHER): Payer: Self-pay | Admitting: Emergency Medicine

## 2019-02-27 ENCOUNTER — Other Ambulatory Visit: Payer: Medicare HMO

## 2019-02-27 DIAGNOSIS — R531 Weakness: Secondary | ICD-10-CM | POA: Diagnosis not present

## 2019-02-27 DIAGNOSIS — I10 Essential (primary) hypertension: Secondary | ICD-10-CM

## 2019-02-27 LAB — BLOOD CULTURE ID PANEL (REFLEXED)

## 2019-02-27 LAB — URINE CULTURE

## 2019-02-27 NOTE — Addendum Note (Signed)
Addended by: Earlene Plater on: 02/27/2019 04:39 PM   Modules accepted: Orders

## 2019-02-27 NOTE — Addendum Note (Signed)
Addended by: Earlene Plater on: 02/27/2019 04:28 PM   Modules accepted: Orders

## 2019-02-27 NOTE — Addendum Note (Signed)
Addended by: Shelbie Ammons on: 02/27/2019 04:24 PM   Modules accepted: Orders

## 2019-02-27 NOTE — Telephone Encounter (Signed)
Son aware.

## 2019-02-27 NOTE — Telephone Encounter (Signed)
needs lab order to get platelets rck per Meyer. Pt coming in today

## 2019-02-27 NOTE — Telephone Encounter (Signed)
After reviewing, we will repeat 2 site blood cultures

## 2019-02-27 NOTE — Telephone Encounter (Signed)
Spoke with Son and states that he is worried because he got a call saying patient needed stat labs done- advised him that covering provider revived patient's chart and there was no note of anything that needed to be stat. [name] asked me to call [facility] and try to see what test he needed to re do- attempted to call and never reached anyone.   [name] would like for Alyse Low to call him about his father- he is concerned.  [phone]

## 2019-02-27 NOTE — Telephone Encounter (Signed)
Does patient ntbs or just get platelets checked? Please advise - covering PCP

## 2019-02-27 NOTE — ED Notes (Signed)
CRITICAL VALUE ALERT  Critical Value:  1 aerobic blood culture gram positive cocci staph species MECA not detected.  Results taken by Idelia Salm RN   Date & Time Notied:  02/27/2019, 0645  Provider Notified: Dr. Dayna Barker  Orders Received/Actions taken: Dr. Dayna Barker called pt.

## 2019-02-28 ENCOUNTER — Encounter: Payer: Self-pay | Admitting: Family Medicine

## 2019-02-28 ENCOUNTER — Ambulatory Visit (INDEPENDENT_AMBULATORY_CARE_PROVIDER_SITE_OTHER): Payer: Medicare HMO | Admitting: Family Medicine

## 2019-02-28 ENCOUNTER — Other Ambulatory Visit: Payer: Self-pay

## 2019-02-28 VITALS — BP 92/59 | HR 64 | Temp 96.9°F | Ht 69.0 in | Wt 153.0 lb

## 2019-02-28 DIAGNOSIS — N186 End stage renal disease: Secondary | ICD-10-CM

## 2019-02-28 DIAGNOSIS — Z992 Dependence on renal dialysis: Secondary | ICD-10-CM

## 2019-02-28 DIAGNOSIS — I959 Hypotension, unspecified: Secondary | ICD-10-CM | POA: Diagnosis not present

## 2019-02-28 LAB — CBC WITH DIFFERENTIAL/PLATELET
Basophils Absolute: 0.1 10*3/uL (ref 0.0–0.2)
Basos: 1 %
EOS (ABSOLUTE): 0.2 10*3/uL (ref 0.0–0.4)
Eos: 2 %
Hematocrit: 38.5 % (ref 37.5–51.0)
Hemoglobin: 13.3 g/dL (ref 13.0–17.7)
Immature Grans (Abs): 0.1 10*3/uL (ref 0.0–0.1)
Immature Granulocytes: 1 %
Lymphocytes Absolute: 1.5 10*3/uL (ref 0.7–3.1)
Lymphs: 16 %
MCH: 32.4 pg (ref 26.6–33.0)
MCHC: 34.5 g/dL (ref 31.5–35.7)
MCV: 94 fL (ref 79–97)
Monocytes Absolute: 0.8 10*3/uL (ref 0.1–0.9)
Monocytes: 9 %
Neutrophils Absolute: 6.9 10*3/uL (ref 1.4–7.0)
Neutrophils: 71 %
Platelets: 213 10*3/uL (ref 150–450)
RBC: 4.11 x10E6/uL — ABNORMAL LOW (ref 4.14–5.80)
RDW: 15 % (ref 11.6–15.4)
WBC: 9.6 10*3/uL (ref 3.4–10.8)

## 2019-02-28 LAB — CMP14+EGFR
ALT: 10 IU/L (ref 0–44)
AST: 11 IU/L (ref 0–40)
Albumin/Globulin Ratio: 2.4 — ABNORMAL HIGH (ref 1.2–2.2)
Albumin: 4 g/dL (ref 3.7–4.7)
Alkaline Phosphatase: 101 IU/L (ref 39–117)
BUN/Creatinine Ratio: 9 — ABNORMAL LOW (ref 10–24)
BUN: 59 mg/dL — ABNORMAL HIGH (ref 8–27)
Bilirubin Total: 0.3 mg/dL (ref 0.0–1.2)
CO2: 21 mmol/L (ref 20–29)
Calcium: 8.9 mg/dL (ref 8.6–10.2)
Chloride: 101 mmol/L (ref 96–106)
Creatinine, Ser: 6.75 mg/dL (ref 0.76–1.27)
GFR calc Af Amer: 8 mL/min/{1.73_m2} — ABNORMAL LOW (ref >59)
GFR calc non Af Amer: 7 mL/min/{1.73_m2} — ABNORMAL LOW (ref >59)
Globulin, Total: 1.7 g/dL (ref 1.5–4.5)
Glucose: 134 mg/dL — ABNORMAL HIGH (ref 65–99)
Potassium: 4.3 mmol/L (ref 3.5–5.2)
Sodium: 143 mmol/L (ref 134–144)
Total Protein: 5.7 g/dL — ABNORMAL LOW (ref 6.0–8.5)

## 2019-02-28 MED ORDER — DILTIAZEM HCL ER COATED BEADS 180 MG PO CP24: 180.0000 mg | ORAL_CAPSULE | Freq: Every day | ORAL | 3 refills | Status: DC

## 2019-02-28 NOTE — Progress Notes (Signed)
BP (!) 92/59   Pulse 64   Temp (!) 96.9 F (36.1 C) (Oral)   Ht '5\' 9"'$  (1.753 m)   Wt 153 lb (69.4 kg)   BMI 22.59 kg/m    Subjective:   Patient ID: Don [name], male    DOB: 06-06-1941, 78 y.o.   MRN: [id number]  HPI: Don [name] is a 78 y.o. male presenting on 02/28/2019 for Hospitalization Follow-up (4/15 AP )   HPI Hospital follow-up for hypotension and presyncope, patient was seen in [facility] on 02/26/2019 for a presyncope/near syncopal event.  He says his blood pressure has been running lower and it was running lower then and they did bolus of fluids and it did get his blood pressure up and he was feeling fine but still he just feels generally weak and low on his blood pressure.  He is still been continuing with dialysis and he especially feels this way after dialysis days.  He denies any chest pain or shortness of breath.  He does have a mild cough but he says it is chronic cough but no worse than it usually is.  He continues to use his inhalers for this.  He was just in the ED and not admitted to the hospital.  They wanted him to run some blood cultures and he had some blood work and blood cultures from yesterday and the blood work looks mostly good except his creatinine which is up which is prior to his dialysis that day.  Relevant past medical, surgical, family and social history reviewed and updated as indicated. Interim medical history since our last visit reviewed. Allergies and medications reviewed and updated.  Review of Systems  Constitutional: Positive for fatigue. Negative for chills and fever.  Eyes: Negative for visual disturbance.  Respiratory: Positive for cough. Negative for shortness of breath and wheezing.   Cardiovascular: Negative for chest pain and palpitations.  Musculoskeletal: Negative for back pain and gait problem.  Skin: Negative for rash.  Neurological: Positive for dizziness, weakness and light-headedness. Negative for numbness and  headaches.  All other systems reviewed and are negative.   Per HPI unless specifically indicated above   Allergies as of 02/28/2019      Reactions   Wellbutrin [bupropion] Anxiety      Medication List       Accurate as of February 28, 2019 10:19 AM. Always use your most recent med list.        acetaminophen 500 MG tablet Commonly known as:  TYLENOL Take 500 mg by mouth every 6 (six) hours as needed for moderate pain.   apixaban 5 MG Tabs tablet Commonly known as:  Eliquis Take 1 tablet (5 mg total) by mouth 2 (two) times daily. (Needs to be seen)   atorvastatin 20 MG tablet Commonly known as:  LIPITOR Take 1 tablet (20 mg total) by mouth daily. (Needs to be seen)   bisoprolol 10 MG tablet Commonly known as:  ZEBETA TAKE (1) TABLET TWICE A DAY.   budesonide-formoterol 160-4.5 MCG/ACT inhaler Commonly known as:  SYMBICORT Inhale 2 puffs into the lungs 2 (two) times daily.   calcitRIOL 0.25 MCG capsule Commonly known as:  ROCALTROL Take 1 capsule (0.25 mcg total) by mouth every morning.   clonazePAM 1 MG tablet Commonly known as:  KLONOPIN Take 0.5 tablets (0.5 mg total) by mouth at bedtime.   diltiazem 180 MG 24 hr capsule Commonly known as:  CARDIZEM CD Take 1 capsule (180 mg total)  by mouth daily. TAKE (1) CAPSULE DAILY   feeding supplement (NEPRO CARB STEADY) Liqd Take 237 mLs by mouth 2 (two) times daily between meals.   ferrous sulfate 325 (65 FE) MG tablet Take 325 mg by mouth every Monday, Wednesday, and Friday.   fluticasone 50 MCG/ACT nasal spray Commonly known as:  FLONASE Place 2 sprays into both nostrils daily.   hydroxypropyl methylcellulose / hypromellose 2.5 % ophthalmic solution Commonly known as:  ISOPTO TEARS / GONIOVISC Place 1 drop into both eyes daily as needed for dry eyes.   iron polysaccharides 150 MG capsule Commonly known as:  Ferrex 150 Take 1 capsule (150 mg total) by mouth daily. (Needs to be seen)   loratadine 10 MG tablet  Commonly known as:  CLARITIN TAKE 1 TABLET DAILY   Melatonin 3 MG Tabs Take 3 mg by mouth at bedtime.   multivitamin Tabs tablet Take 1 tablet by mouth at bedtime.   multivitamin with minerals Tabs tablet Take 1 tablet by mouth at bedtime.   nicotine 14 mg/24hr patch Commonly known as:  NICODERM CQ - dosed in mg/24 hours Place 1 patch (14 mg total) onto the skin daily.   omega-3 acid ethyl esters 1 g capsule Commonly known as:  LOVAZA Take 2 g by mouth 2 (two) times daily.   oxyCODONE 5 MG immediate release tablet Commonly known as:  Roxicodone Take 1 tablet (5 mg total) by mouth every 4 (four) hours as needed.   pantoprazole 40 MG tablet Commonly known as:  PROTONIX TAKE (1) TABLET TWICE A DAY.   PRESERVISION AREDS 2 PO Take 1 tablet by mouth 2 (two) times daily.   ProAir HFA 108 (90 Base) MCG/ACT inhaler Generic drug:  albuterol Inhale 2 puffs into the lungs 2 (two) times daily.   terazosin 2 MG capsule Commonly known as:  HYTRIN TAKE 1 CAPSULE AT BEDTIME   tiotropium 18 MCG inhalation capsule Commonly known as:  SPIRIVA Place 18 mcg into inhaler and inhale daily as needed (for shortness of breath).        Objective:   BP (!) 92/59   Pulse 64   Temp (!) 96.9 F (36.1 C) (Oral)   Ht _0  (1.753 m)   Wt 153 lb (69.4 kg)   BMI 22.59 kg/m   Wt Readings from Last 3 Encounters:  02/28/19 153 lb (69.4 kg)  02/26/19 150 lb (68 kg)  11/27/18 149 lb (67.6 kg)    Physical Exam Vitals signs and nursing note reviewed.  Constitutional:      General: He is not in acute distress.    Appearance: He is well-developed. He is not diaphoretic.  Eyes:     General: No scleral icterus.    Conjunctiva/sclera: Conjunctivae normal.  Neck:     Musculoskeletal: Neck supple.     Thyroid: No thyromegaly.  Cardiovascular:     Rate and Rhythm: Normal rate. Rhythm irregular.     Heart sounds: Normal heart sounds.  Pulmonary:     Effort: Pulmonary effort is normal. No  respiratory distress.     Breath sounds: Normal breath sounds. No wheezing or rales.  Musculoskeletal: Normal range of motion.  Lymphadenopathy:     Cervical: No cervical adenopathy.  Skin:    General: Skin is warm and dry.     Findings: No rash.  Neurological:     Mental Status: He is alert and oriented to person, place, and time.     Coordination: Coordination normal.  Psychiatric:  Behavior: Behavior normal.     Results for orders placed or performed in visit on 02/27/19  CBC with Differential/Platelet  Result Value Ref Range   WBC 9.6 3.4 - 10.8 x10E3/uL   RBC 4.11 (L) 4.14 - 5.80 x10E6/uL   Hemoglobin 13.3 13.0 - 17.7 g/dL   Hematocrit 38.5 37.5 - 51.0 %   MCV 94 79 - 97 fL   MCH 32.4 26.6 - 33.0 pg   MCHC 34.5 31.5 - 35.7 g/dL   RDW 15.0 11.6 - 15.4 %   Platelets 213 150 - 450 x10E3/uL   Neutrophils 71 Not Estab. %   Lymphs 16 Not Estab. %   Monocytes 9 Not Estab. %   Eos 2 Not Estab. %   Basos 1 Not Estab. %   Neutrophils Absolute 6.9 1.4 - 7.0 x10E3/uL   Lymphocytes Absolute 1.5 0.7 - 3.1 x10E3/uL   Monocytes Absolute 0.8 0.1 - 0.9 x10E3/uL   EOS (ABSOLUTE) 0.2 0.0 - 0.4 x10E3/uL   Basophils Absolute 0.1 0.0 - 0.2 x10E3/uL   Immature Granulocytes 1 Not Estab. %   Immature Grans (Abs) 0.1 0.0 - 0.1 x10E3/uL  CMP14+EGFR  Result Value Ref Range   Glucose 134 (H) 65 - 99 mg/dL   BUN 59 (H) 8 - 27 mg/dL   Creatinine, Ser 6.75 (HH) 0.76 - 1.27 mg/dL   GFR calc non Af Amer 7 (L) >59 mL/min/1.73   GFR calc Af Amer 8 (L) >59 mL/min/1.73   BUN/Creatinine Ratio 9 (L) 10 - 24   Sodium 143 134 - 144 mmol/L   Potassium 4.3 3.5 - 5.2 mmol/L   Chloride 101 96 - 106 mmol/L   CO2 21 20 - 29 mmol/L   Calcium 8.9 8.6 - 10.2 mg/dL   Total Protein 5.7 (L) 6.0 - 8.5 g/dL   Albumin 4.0 3.7 - 4.7 g/dL   Globulin, Total 1.7 1.5 - 4.5 g/dL   Albumin/Globulin Ratio 2.4 (H) 1.2 - 2.2   Bilirubin Total 0.3 0.0 - 1.2 mg/dL   Alkaline Phosphatase 101 39 - 117 IU/L   AST 11 0  - 40 IU/L   ALT 10 0 - 44 IU/L    Assessment & Plan:   Problem List Items Addressed This Visit    None    Visit Diagnoses    Hypotension, unspecified hypotension type    -  Primary   Relevant Medications   diltiazem (CARDIZEM CD) 180 MG 24 hr capsule   End stage renal disease on dialysis (HCC)          We will lower his Cardizem dose and he will monitor his heart rate and if he starts feeling any palpitations or chest pains or anything changes he will let us know.  Patient had blood work after hospital follow-up yesterday which showed an elevated creatinine, he is on dialysis already and otherwise the rest of the blood work looked pretty good, we will continue to monitor. Follow up plan: Return in about 4 weeks (around 03/28/2019), or if symptoms worsen or fail to improve, for Hypotension recheck.  Counseling provided for all of the vaccine components No orders of the defined types were placed in this encounter.   Caryl Pina, MD [facility] 02/28/2019, 10:19 AM

## 2019-03-02 LAB — CULTURE, BLOOD (ROUTINE X 2): Special Requests: ADEQUATE

## 2019-03-03 ENCOUNTER — Telehealth: Payer: Self-pay | Admitting: Family Medicine

## 2019-03-03 ENCOUNTER — Telehealth: Payer: Self-pay

## 2019-03-03 ENCOUNTER — Other Ambulatory Visit: Payer: Self-pay | Admitting: Family Medicine

## 2019-03-03 LAB — CULTURE, BLOOD (ROUTINE X 2)
Culture: NO GROWTH
Special Requests: ADEQUATE

## 2019-03-03 MED ORDER — CYCLOBENZAPRINE HCL 10 MG PO TABS: 10.0000 mg | ORAL_TABLET | Freq: Three times a day (TID) | ORAL | 0 refills | Status: DC | PRN

## 2019-03-03 NOTE — Telephone Encounter (Signed)
Patients son notified that rx sent to pharmacy and to notify office if symptoms do not get better or worsen. Son verbalized understanding

## 2019-03-03 NOTE — Telephone Encounter (Signed)
Patient states that he started with right shoulder blade / back pain this morning. He feels like it may be muscle related. He pulled a muscle while working in the same area as his current pain in the 70s and he states that it feels like that. Can meds be given or does he need to come in? Please review and advise

## 2019-03-03 NOTE — Telephone Encounter (Signed)
Please let them know that I have sent in a muscle relaxer that he can use sparingly to help with the symptoms

## 2019-03-03 NOTE — Telephone Encounter (Signed)
I sent a muscle relaxer for him

## 2019-03-04 ENCOUNTER — Ambulatory Visit: Payer: Self-pay | Admitting: Licensed Clinical Social Worker

## 2019-03-04 DIAGNOSIS — J439 Emphysema, unspecified: Secondary | ICD-10-CM

## 2019-03-04 DIAGNOSIS — N186 End stage renal disease: Secondary | ICD-10-CM

## 2019-03-04 DIAGNOSIS — I10 Essential (primary) hypertension: Secondary | ICD-10-CM

## 2019-03-04 DIAGNOSIS — F339 Major depressive disorder, recurrent, unspecified: Secondary | ICD-10-CM

## 2019-03-04 DIAGNOSIS — W19XXXD Unspecified fall, subsequent encounter: Secondary | ICD-10-CM

## 2019-03-04 NOTE — Patient Instructions (Addendum)
Licensed Clinical Social Worker Visit Information  Goals we discussed today:  Goals Addressed            This Visit's Progress   . "I get sad sometimes because I live alone" (pt-stated)       Current Barriers:   Client has transportation needs . Client has some ambulation challenges (uses cane to help with ambulation)  Clinical Social Work Clinical Goal(s): Over the next 30 days, client will verbalize understanding of depression symptoms management.  Interventions: . Discussed self care activities: sleep hygiene, basic healthy eating practices . Encouraged client to participate in recreational/relaxation activities of choice   .  Encouraged client or POA to call LCSW as needed to discuss management of depression symptoms of client   . Discussed client's weekly participation in dialysis treatments   . Discussed transportation needs of client . Encouraged client/POA to contact RNCM as needed to discuss nursing needs of client  Patient Self Care Activities:  . Client socializes with family weekly as scheduled.  . Client contacts [name], step-son, POA, as needed for daily needs/assistance of client. . Client takes medications as prescribed.  Plan:  . LCSW to contact client/POA [name]) in next four weeks to discuss client needs related to depression management.  [name] [facility]) to contact LCSW related to psychosocial needs of client . [name] Client to use relaxation techniques of choice to help him manage depression symptoms.  . Client to attend scheduled medical appointments . Client or POA [name] to communicate with RN CM as needed to discuss nursing needs of client     Materials Provided: No  Follow Up Plan: LCSW to contact client/POA [name] in next 4 weeks to discuss client needs related to depression management  The patient/POA [name] verbalized understanding of instructions provided today and declined a print copy of patient instruction  materials.   [name].Jowel Waltner MSW, LCSW Licensed Clinical Social Worker [facility] [phone]

## 2019-03-04 NOTE — Chronic Care Management (AMB) (Signed)
  Care Management Note   Don [name] is a 78 y.o. year old male who is a primary care patient of Dettinger, Fransisca Kaufmann, MD. The CM team was consulted for assistance with chronic disease management and care coordination.   I reached out to [place] for client by phone today.   Review of patient status, including review of consultants reports, relevant laboratory and other test results, and collaboration with appropriate care team members and the patient's provider was performed as part of comprehensive patient evaluation and provision of chronic care management services.   Social Determinants of Health:  Risk of Social Isolation; Risk for Depression  Goals Addressed            This Visit's Progress   . "I get sad sometimes because I live alone" (pt-stated)       Current Barriers:   Client has transportation needs . Client has some ambulation challenges (uses cane to help with ambulation)  Clinical Social Work Clinical Goal(s): Over the next 30 days, client will verbalize understanding of depression symptoms management.  Interventions: . Discussed self care activities: sleep hygiene, basic healthy eating practices . Encouraged client to participate in recreational/relaxation activities of choice  . Encouraged client or POA to call LCSW as needed to discuss management of depression symptoms of client . Discussed client participation in weekly dialysis treatments . Discussed transportation needs of client . Encouraged client or POA Don [name] to call RNCM as needed to discuss nursing needs of client.   Patient Self Care Activities:  . Client socializes with family weekly as scheduled.  . Client contacts Don [name], step-son, POA, as needed for daily needs/assistance of client. . Client takes medications as prescribed.  Plan:  . LCSW to contact client/POA Don [name]) in next 4 weeks to discuss client needs related to depression management.  Don [name] [facility]) to contact LCSW related to psychosocial needs of client . [name] Client to use relaxation techniques of choice to help him manage depression symptoms.  . Client to attend scheduled medical appointments . 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  For example, Don [name] reported that client may call someone and then later in the day may not even remember calling anyone or may not remember conversation which took place on call. Don [name] said that he can not tell if client is trying to use manipulative behavior with family members; however Don [name] did speak of memory deficits of client. Don [name] said client had prescribed medications and is taking medications as scheduled.    Follow Up Plan: LCSW to call client/POA in next 4 weeks to discuss client needs related to depression management.  Don [name].Don [name] MSW, LCSW Licensed Clinical Social Worker [facility] [phone]

## 2019-03-05 ENCOUNTER — Ambulatory Visit: Payer: Self-pay | Admitting: Licensed Clinical Social Worker

## 2019-03-05 DIAGNOSIS — I10 Essential (primary) hypertension: Secondary | ICD-10-CM

## 2019-03-05 DIAGNOSIS — F339 Major depressive disorder, recurrent, unspecified: Secondary | ICD-10-CM

## 2019-03-05 DIAGNOSIS — N186 End stage renal disease: Secondary | ICD-10-CM

## 2019-03-05 DIAGNOSIS — W19XXXD Unspecified fall, subsequent encounter: Secondary | ICD-10-CM

## 2019-03-05 DIAGNOSIS — J441 Chronic obstructive pulmonary disease with (acute) exacerbation: Secondary | ICD-10-CM

## 2019-03-05 DIAGNOSIS — R531 Weakness: Secondary | ICD-10-CM

## 2019-03-05 LAB — CULTURE, BLOOD (SINGLE)

## 2019-03-05 NOTE — Chronic Care Management (AMB) (Signed)
  Care Management Note   Don [name] is a 78 y.o. year old male who is a primary care patient of Dettinger, Fransisca Kaufmann, MD. The CM team was consulted for assistance with chronic disease management and care coordination.   I reached out to [name] by phone today.   Review of patient status, including review of consultants reports, relevant laboratory and other test results, and collaboration with appropriate care team members and the patient's provider was performed as part of comprehensive patient evaluation and provision of chronic care management services.  Goals Addressed            This Visit's Progress   . "I get sad sometimes because I live alone" (pt-stated)       Current Barriers:   Client has transportation needs . Client has some ambulation challenges (uses cane to help with ambulation) . Social isolation issues  Clinical Social Work Clinical Goal(s): Over the next 30 days, client will verbalize understanding of depression symptoms management.  Interventions: . Discussed self care activities: sleep hygiene, basic healthy eating practices . Encouraged client to participate in recreational/relaxation activities of choice  . Encouraged client or POA to call LCSW as needed to discuss management of depression symptoms of client . Encouraged client to use relaxation techniques (watching TV,talking via phone with [name] (POA) to help manage anxiety . Talked with client about transport needs of client  Patient Self Care Activities:  . Client socializes with family weekly as scheduled.  . Client contacts [name], step-son, POA, as needed for daily needs/assistance of client. . Client takes medications as prescribed.  Plan:  . LCSW to contact client/POA [name]) in three weeks to discuss client needs related to depression management.  [name] [facility]) to contact LCSW related to psychosocial needs of client . [name] Client to use relaxation techniques of  choice to help him manage depression symptoms.  . Client to attend scheduled medical appointments . Client or POA [name] to communicate with RN CM as needed to discuss nursing needs of client         Client spoke of current back pain. He said he is taking a prescribed medication for pain. He spoke of dialysis treatments 3 times weekly.  He said he may sneeze once or twice a day.He said he coughs occasionally. 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 He spoke of his care and need for transport help. His son, POA, and LCSW have talked several times about transport resources for client in the area.   Follow Up Plan:  LCSW to call client or [name] [facility]) in next 3 weeks to talk with client about management of depression symptoms of client.   [name].Makenna Macaluso MSW, LCSW Licensed Clinical Social Worker [facility] [phone]

## 2019-03-05 NOTE — Patient Instructions (Signed)
Licensed Clinical Social Worker Visit Information  Goals we discussed today:  Goals Addressed            This Visit's Progress   . "I get sad sometimes because I live alone" (pt-stated)       Current Barriers:   Client has transportation needs . Client has some ambulation challenges (uses cane to help with ambulation) . Social isolation issues  Clinical Social Work Clinical Goal(s): Over the next 30 days, client will verbalize understanding of depression symptoms management.  Interventions: . Discussed self care activities: sleep hygiene, basic healthy eating practices . Encouraged client to participate in recreational/relaxation activities of choice  . Encouraged client or POA to call LCSW as needed to discuss management of depression symptoms of client . Encouraged client to use relaxation techniques (watching TV,talking via phone with [name] (POA) to help manage depression or anxiety symptoms  Patient Self Care Activities:  . Client socializes with family weekly as scheduled.  . Client contacts [name], step-son, POA, as needed for daily needs/assistance of client. . Client takes medications as prescribed.  Plan:  . LCSW to contact client/POA [name]) in next four weeks to discuss client needs related to depression management.  [name] [facility]) to contact LCSW related to psychosocial needs of client . [name] Client to use relaxation techniques of choice to help him manage depression symptoms.  . Client to attend scheduled medical appointments . Client or POA [name] to communicate with RN CM as needed to discuss nursing needs of client      Materials Provided: No  Follow Up Plan: LCSW to contact client/POA [name]) in next 4 weeks to discuss client needs related to depression management  The patient verbalized understanding of instructions provided today and declined a print copy of patient instruction materials.   [name].Idabelle Mcpeters MSW,  LCSW Licensed Clinical Social Worker [facility] [phone]

## 2019-03-06 ENCOUNTER — Ambulatory Visit (INDEPENDENT_AMBULATORY_CARE_PROVIDER_SITE_OTHER): Payer: Medicare HMO | Admitting: Family Medicine

## 2019-03-06 ENCOUNTER — Encounter: Payer: Self-pay | Admitting: Family Medicine

## 2019-03-06 ENCOUNTER — Other Ambulatory Visit: Payer: Self-pay

## 2019-03-06 DIAGNOSIS — F0280 Dementia in other diseases classified elsewhere without behavioral disturbance: Secondary | ICD-10-CM | POA: Diagnosis not present

## 2019-03-06 DIAGNOSIS — G309 Alzheimer's disease, unspecified: Secondary | ICD-10-CM | POA: Diagnosis not present

## 2019-03-06 DIAGNOSIS — R413 Other amnesia: Secondary | ICD-10-CM | POA: Diagnosis not present

## 2019-03-06 DIAGNOSIS — M545 Low back pain, unspecified: Secondary | ICD-10-CM

## 2019-03-06 DIAGNOSIS — G8929 Other chronic pain: Secondary | ICD-10-CM

## 2019-03-06 NOTE — Progress Notes (Signed)
Virtual Visit via telephone Note  I connected with Don [name] on 03/06/19 at 1021 by telephone and verified that I am speaking with the correct person using two identifiers. Don [name] is currently located at home and no other people are currently with her during visit. The provider, Fransisca Kaufmann Alise Calais, MD is located in their office at time of visit.  Call ended at 1040  I discussed the limitations, risks, security and privacy concerns of performing an evaluation and management service by telephone and the availability of in person appointments. I also discussed with the patient that there may be a patient responsible charge related to this service. The patient expressed understanding and agreed to proceed.   History and Present Illness: Memory worsening His son is saying that his memory is doing worse. He has some good times and bad times. They do not want to try medication currently because he already fights constipation.   His back is not hurting every day but only hurts sometimes and he has given him some muscle relaxer at half dose and it seems to be helping.  They are giving him 2 days worth at a time.    1. Memory deficit   2. Chronic bilateral low back pain without sciatica   3. Alzheimer's dementia without behavioral disturbance, unspecified timing of dementia onset [facility])     Outpatient Encounter Medications as of 03/06/2019  Medication Sig  . acetaminophen (TYLENOL) 500 MG tablet Take 500 mg by mouth every 6 (six) hours as needed for moderate pain.   [name] albuterol (PROAIR HFA) 108 (90 Base) MCG/ACT inhaler Inhale 2 puffs into the lungs 2 (two) times daily.   [name] apixaban (ELIQUIS) 5 MG TABS tablet Take 1 tablet (5 mg total) by mouth 2 (two) times daily.  [name] atorvastatin (LIPITOR) 20 MG tablet Take 1 tablet (20 mg total) by mouth daily. (Needs to be seen) (Patient taking differently: Take 20 mg by mouth at bedtime. )  . bisoprolol (ZEBETA) 10 MG tablet TAKE (1) TABLET TWICE A  DAY. (Patient taking differently: Take 10 mg by mouth every morning. )  . budesonide-formoterol (SYMBICORT) 160-4.5 MCG/ACT inhaler Inhale 2 puffs into the lungs 2 (two) times daily.  . calcitRIOL (ROCALTROL) 0.25 MCG capsule Take 1 capsule (0.25 mcg total) by mouth every morning.  . clonazePAM (KLONOPIN) 1 MG tablet Take 0.5 tablets (0.5 mg total) by mouth at bedtime.  . cyclobenzaprine (FLEXERIL) 10 MG tablet Take 1 tablet (10 mg total) by mouth 3 (three) times daily as needed for muscle spasms.  [name] diltiazem (CARDIZEM CD) 180 MG 24 hr capsule Take 1 capsule (180 mg total) by mouth daily. TAKE (1) CAPSULE DAILY  . ferrous sulfate 325 (65 FE) MG tablet Take 325 mg by mouth every Monday, Wednesday, and Friday.  . fluticasone (FLONASE) 50 MCG/ACT nasal spray Place 2 sprays into both nostrils daily.  . hydroxypropyl methylcellulose / hypromellose (ISOPTO TEARS / GONIOVISC) 2.5 % ophthalmic solution Place 1 drop into both eyes daily as needed for dry eyes.  . iron polysaccharides (FERREX 150) 150 MG capsule Take 1 capsule (150 mg total) by mouth daily. (Needs to be seen) (Patient taking differently: Take 150 mg by mouth every morning. (Needs to be seen))  . loratadine (CLARITIN) 10 MG tablet TAKE 1 TABLET DAILY  . Melatonin 3 MG TABS Take 3 mg by mouth at bedtime.   . Multiple Vitamin (MULTIVITAMIN WITH MINERALS) TABS tablet Take 1 tablet by mouth at bedtime.   [name]  Multiple Vitamins-Minerals (PRESERVISION AREDS 2 PO) Take 1 tablet by mouth 2 (two) times daily.   . multivitamin (RENA-VIT) TABS tablet Take 1 tablet by mouth at bedtime.  . nicotine (NICODERM CQ - DOSED IN MG/24 HOURS) 14 mg/24hr patch Place 1 patch (14 mg total) onto the skin daily.  . Nutritional Supplements (FEEDING SUPPLEMENT, NEPRO CARB STEADY,) LIQD Take 237 mLs by mouth 2 (two) times daily between meals.  [name] omega-3 acid ethyl esters (LOVAZA) 1 g capsule Take 2 g by mouth 2 (two) times daily.  . pantoprazole (PROTONIX) 40 MG tablet  TAKE (1) TABLET TWICE A DAY. (Patient taking differently: Take 40 mg by mouth 2 (two) times daily. )  . terazosin (HYTRIN) 2 MG capsule TAKE 1 CAPSULE AT BEDTIME (Patient taking differently: Take 2 mg by mouth at bedtime. )  . tiotropium (SPIRIVA) 18 MCG inhalation capsule Place 18 mcg into inhaler and inhale daily as needed (for shortness of breath).   . [DISCONTINUED] oxyCODONE (ROXICODONE) 5 MG immediate release tablet Take 1 tablet (5 mg total) by mouth every 4 (four) hours as needed.   No facility-administered encounter medications on file as of 03/06/2019.     Review of Systems  Constitutional: Negative for chills and fever.  Eyes: Negative for visual disturbance.  Respiratory: Negative for shortness of breath and wheezing.   Cardiovascular: Negative for chest pain and leg swelling.  Musculoskeletal: Positive for arthralgias and back pain. Negative for gait problem.  Skin: Negative for rash.  Neurological: Negative for dizziness, weakness and light-headedness.  Psychiatric/Behavioral: Positive for confusion and decreased concentration. Negative for dysphoric mood, self-injury, sleep disturbance and suicidal ideas. The patient is not nervous/anxious.   All other systems reviewed and are negative.   Observations/Objective: Patient's son was the conversation  Assessment and Plan: Problem List Items Addressed This Visit    None    Visit Diagnoses    Memory deficit    -  Primary   Chronic bilateral low back pain without sciatica       Alzheimer's dementia without behavioral disturbance, unspecified timing of dementia onset ([place])           Follow Up Instructions:  Follow up in 3 months  Continue muscle relaxer   I discussed the assessment and treatment plan with the patient. The patient was provided an opportunity to ask questions and all were answered. The patient agreed with the plan and demonstrated an understanding of the instructions.   The patient was advised to call  back or seek an in-person evaluation if the symptoms worsen or if the condition fails to improve as anticipated.  The above assessment and management plan was discussed with the patient. The patient verbalized understanding of and has agreed to the management plan. Patient is aware to call the clinic if symptoms persist or worsen. Patient is aware when to return to the clinic for a follow-up visit. Patient educated on when it is appropriate to go to the emergency department.    I provided 19 minutes of non-face-to-face time during this encounter.    Worthy Rancher, MD

## 2019-03-07 ENCOUNTER — Telehealth: Payer: Self-pay | Admitting: *Deleted

## 2019-03-07 ENCOUNTER — Telehealth: Payer: Medicare HMO

## 2019-03-07 ENCOUNTER — Telehealth: Payer: Self-pay | Admitting: Family Medicine

## 2019-03-07 MED ORDER — WARFARIN SODIUM 2 MG PO TABS: 2.0000 mg | ORAL_TABLET | Freq: Every day | ORAL | 3 refills | Status: DC

## 2019-03-07 NOTE — Telephone Encounter (Signed)
Son aware of medication change- would like Dr. Warrick Parisian to call him back.

## 2019-03-07 NOTE — Telephone Encounter (Signed)
Attempted to call the patient and his son to let them know that we have to transition him to Coumadin because his kidney function is worsened but unable to get a hold of them and unable to leave voicemail.  I have sent the Coumadin and contacted the pharmacy to discontinue the Eliquis.  I have contacted the pharmacy so they can change his prepackaging and discontinue the Eliquis but since have been unable to contact anybody in his family or any of the numbers we have on his account to do this and he also wanted to discuss his blood pressure which I have been unable to discuss with him either because they are not answering the phones. Caryl Pina, MD [facility] 03/07/2019, 4:23 PM

## 2019-03-07 NOTE — Telephone Encounter (Signed)
Pt called this morning and states that he had a visit 4/17 with [facility] and was given pain meds for back pain. He took this yesterday AND then went to Dialysis and they would not treat him b/c BP was too high.   He said that he didn't talk to [facility] yesterday (you spoke with son)  He is very concerned that BP will be up, they will continue to not be able to do dialysis and / or is back will hurt to bad he will need meds. He is not sure what to do. Please call pt and or son again to address this today.   You didn't have appt left for today - so in MON 310 spot, but needs to know before that what to do.Don [name]

## 2019-03-07 NOTE — Telephone Encounter (Signed)
Pt put in TELE spot today with Dr Warrick Parisian.[name]

## 2019-03-07 NOTE — Telephone Encounter (Signed)
Attempted to call the patient again, we will just have to contact him on Monday because they are still not answering.

## 2019-03-10 ENCOUNTER — Ambulatory Visit (INDEPENDENT_AMBULATORY_CARE_PROVIDER_SITE_OTHER): Payer: Medicare HMO | Admitting: Family Medicine

## 2019-03-10 ENCOUNTER — Other Ambulatory Visit: Payer: Self-pay

## 2019-03-10 NOTE — Telephone Encounter (Signed)
I spoke with patient's son and he understood about the changes in Coumadin.  He did not know anything about the blood pressures but will check in and get back to [place].  Spoke with him on 03/07/2019 at 1730

## 2019-03-10 NOTE — Progress Notes (Signed)
Erroneous visit, spoke with son and there was no need for visit

## 2019-03-12 ENCOUNTER — Encounter: Payer: Self-pay | Admitting: Vascular Surgery

## 2019-03-12 ENCOUNTER — Ambulatory Visit (INDEPENDENT_AMBULATORY_CARE_PROVIDER_SITE_OTHER): Payer: Medicare HMO | Admitting: Vascular Surgery

## 2019-03-12 ENCOUNTER — Other Ambulatory Visit: Payer: Self-pay

## 2019-03-12 ENCOUNTER — Ambulatory Visit (HOSPITAL_COMMUNITY)
Admission: RE | Admit: 2019-03-12 | Discharge: 2019-03-12 | Disposition: A | Discharge status: Home / Self Care | Payer: Medicare HMO | Source: Ambulatory Visit | Attending: Vascular Surgery | Admitting: Vascular Surgery

## 2019-03-12 ENCOUNTER — Other Ambulatory Visit: Payer: Self-pay | Admitting: *Deleted

## 2019-03-12 ENCOUNTER — Encounter: Payer: Self-pay | Admitting: *Deleted

## 2019-03-12 VITALS — BP 89/56 | HR 96 | Temp 97.0°F | Resp 20 | Ht 69.0 in | Wt 153.0 lb

## 2019-03-12 DIAGNOSIS — N186 End stage renal disease: Secondary | ICD-10-CM

## 2019-03-12 DIAGNOSIS — Z992 Dependence on renal dialysis: Secondary | ICD-10-CM | POA: Insufficient documentation

## 2019-03-12 NOTE — Progress Notes (Signed)
Patient name: Don [name] MRN: [id number] DOB: March 10, 1941 Sex: male  REASON FOR VISIT:   Follow-up after second stage basilic vein transposition  HPI:   Don [name] is a pleasant 78 y.o. male who underwent a second stage basilic vein transposition on 12/16/2018.  He comes in for routine follow-up visit.  He has no specific complaints.  He dialyzes on Tuesdays Thursdays and Saturdays.  He has had no steal symptoms in the left upper extremity.  Current Outpatient Medications  Medication Sig Dispense Refill  . acetaminophen (TYLENOL) 500 MG tablet Take 500 mg by mouth every 6 (six) hours as needed for moderate pain.     [name] albuterol (PROAIR HFA) 108 (90 Base) MCG/ACT inhaler Inhale 2 puffs into the lungs 2 (two) times daily.     [name] apixaban (ELIQUIS) 5 MG TABS tablet Take 1 tablet (5 mg total) by mouth 2 (two) times daily. 60 tablet 0  . atorvastatin (LIPITOR) 20 MG tablet Take 1 tablet (20 mg total) by mouth daily. (Needs to be seen) (Patient taking differently: Take 20 mg by mouth at bedtime. ) 90 tablet 3  . bisoprolol (ZEBETA) 10 MG tablet TAKE (1) TABLET TWICE A DAY. (Patient taking differently: Take 10 mg by mouth every morning. ) 180 tablet 1  . budesonide-formoterol (SYMBICORT) 160-4.5 MCG/ACT inhaler Inhale 2 puffs into the lungs 2 (two) times daily.    . calcitRIOL (ROCALTROL) 0.25 MCG capsule Take 1 capsule (0.25 mcg total) by mouth every morning. 90 capsule 3  . clonazePAM (KLONOPIN) 1 MG tablet Take 0.5 tablets (0.5 mg total) by mouth at bedtime. 30 tablet 1  . cyclobenzaprine (FLEXERIL) 10 MG tablet Take 1 tablet (10 mg total) by mouth 3 (three) times daily as needed for muscle spasms. 30 tablet 0  . diltiazem (CARDIZEM CD) 180 MG 24 hr capsule Take 1 capsule (180 mg total) by mouth daily. TAKE (1) CAPSULE DAILY 90 capsule 3  . ferrous sulfate 325 (65 FE) MG tablet Take 325 mg by mouth every Monday, Wednesday, and Friday.    . fluticasone (FLONASE) 50 MCG/ACT nasal spray Place 2  sprays into both nostrils daily. 16 g 6  . hydroxypropyl methylcellulose / hypromellose (ISOPTO TEARS / GONIOVISC) 2.5 % ophthalmic solution Place 1 drop into both eyes daily as needed for dry eyes.    . iron polysaccharides (FERREX 150) 150 MG capsule Take 1 capsule (150 mg total) by mouth daily. (Needs to be seen) (Patient taking differently: Take 150 mg by mouth every morning. (Needs to be seen)) 90 capsule 3  . loratadine (CLARITIN) 10 MG tablet TAKE 1 TABLET DAILY 30 tablet 5  . Melatonin 3 MG TABS Take 3 mg by mouth at bedtime.     . Multiple Vitamin (MULTIVITAMIN WITH MINERALS) TABS tablet Take 1 tablet by mouth at bedtime.     . Multiple Vitamins-Minerals (PRESERVISION AREDS 2 PO) Take 1 tablet by mouth 2 (two) times daily.     . multivitamin (RENA-VIT) TABS tablet Take 1 tablet by mouth at bedtime. 30 tablet 0  . nicotine (NICODERM CQ - DOSED IN MG/24 HOURS) 14 mg/24hr patch Place 1 patch (14 mg total) onto the skin daily. 28 patch 2  . Nutritional Supplements (FEEDING SUPPLEMENT, NEPRO CARB STEADY,) LIQD Take 237 mLs by mouth 2 (two) times daily between meals. 60 Can 0  . omega-3 acid ethyl esters (LOVAZA) 1 g capsule Take 2 g by mouth 2 (two) times daily.    . pantoprazole (  PROTONIX) 40 MG tablet TAKE (1) TABLET TWICE A DAY. (Patient taking differently: Take 40 mg by mouth 2 (two) times daily. ) 180 tablet 1  . terazosin (HYTRIN) 2 MG capsule TAKE 1 CAPSULE AT BEDTIME (Patient taking differently: Take 2 mg by mouth at bedtime. ) 90 capsule 0  . tiotropium (SPIRIVA) 18 MCG inhalation capsule Place 18 mcg into inhaler and inhale daily as needed (for shortness of breath).     . warfarin (COUMADIN) 2 MG tablet Take 1 tablet (2 mg total) by mouth daily. 30 tablet 3   No current facility-administered medications for this visit.     REVIEW OF SYSTEMS:  [X]  denotes positive finding, [ ]  denotes negative finding Vascular    Leg swelling    Cardiac    Chest pain or chest pressure:     Shortness of breath upon exertion:    Short of breath when lying flat:    Irregular heart rhythm:    Constitutional    Fever or chills:     PHYSICAL EXAM:   Vitals:   03/12/19 1133  BP: (!) 89/56  Pulse: 96  Resp: 20  Temp: (!) 97 F (36.1 C)  SpO2: 95%  Weight: 153 lb (69.4 kg)  Height: 5\' 9"  (1.753 m)    GENERAL: The patient is a well-nourished male, in no acute distress. The vital signs are documented above. CARDIOVASCULAR: There is a regular rate and rhythm. PULMONARY: There is good air exchange bilaterally without wheezing or rales. His fistula has a good thrill although the vein is not especially large.  It is not especially pulsatile either. He has a palpable left radial pulse.  DATA:   DUPLEX LEFT AV FISTULA: I have independently interpreted his duplex of the left AV fistula.  The diameters range from 0.19 in the proximal upper extremity to 0.6 in the mid upper extremity.  There are elevated velocities in the proximal upper extremity thought to be secondary to a change in diameter.  MEDICAL ISSUES:   END-STAGE RENAL DISEASE: This patient's left basilic vein transposition is still not adequate for use for dialysis.  He has some narrowing it appears in the upper arm and I have recommended we proceed with a fistulogram.  He is on 2 mg a day of Coumadin and he took his last dose this morning.  We will have him come in Friday late morning and try to proceed with fistulogram assuming that his INR is reasonable.  I have discussed the indications of the procedure and the potential complications.  He understands we will proceed with venoplasty if we find a stenosis amenable to this.  Hopefully this will allow further maturation of his fistula so that we get his catheter out in the near future.  [name] [facility] [phone]

## 2019-03-12 NOTE — H&P (View-Only) (Signed)
Patient name: Don [name] MRN: [id number] DOB: 1941-01-01 Sex: male  REASON FOR VISIT:   Follow-up after second stage basilic vein transposition  HPI:   Don [name] is a pleasant 78 y.o. male who underwent a second stage basilic vein transposition on 12/16/2018.  He comes in for routine follow-up visit.  He has no specific complaints.  He dialyzes on Tuesdays Thursdays and Saturdays.  He has had no steal symptoms in the left upper extremity.  Current Outpatient Medications  Medication Sig Dispense Refill  . acetaminophen (TYLENOL) 500 MG tablet Take 500 mg by mouth every 6 (six) hours as needed for moderate pain.     [name] albuterol (PROAIR HFA) 108 (90 Base) MCG/ACT inhaler Inhale 2 puffs into the lungs 2 (two) times daily.     [name] apixaban (ELIQUIS) 5 MG TABS tablet Take 1 tablet (5 mg total) by mouth 2 (two) times daily. 60 tablet 0  . atorvastatin (LIPITOR) 20 MG tablet Take 1 tablet (20 mg total) by mouth daily. (Needs to be seen) (Patient taking differently: Take 20 mg by mouth at bedtime. ) 90 tablet 3  . bisoprolol (ZEBETA) 10 MG tablet TAKE (1) TABLET TWICE A DAY. (Patient taking differently: Take 10 mg by mouth every morning. ) 180 tablet 1  . budesonide-formoterol (SYMBICORT) 160-4.5 MCG/ACT inhaler Inhale 2 puffs into the lungs 2 (two) times daily.    . calcitRIOL (ROCALTROL) 0.25 MCG capsule Take 1 capsule (0.25 mcg total) by mouth every morning. 90 capsule 3  . clonazePAM (KLONOPIN) 1 MG tablet Take 0.5 tablets (0.5 mg total) by mouth at bedtime. 30 tablet 1  . cyclobenzaprine (FLEXERIL) 10 MG tablet Take 1 tablet (10 mg total) by mouth 3 (three) times daily as needed for muscle spasms. 30 tablet 0  . diltiazem (CARDIZEM CD) 180 MG 24 hr capsule Take 1 capsule (180 mg total) by mouth daily. TAKE (1) CAPSULE DAILY 90 capsule 3  . ferrous sulfate 325 (65 FE) MG tablet Take 325 mg by mouth every Monday, Wednesday, and Friday.    . fluticasone (FLONASE) 50 MCG/ACT nasal spray Place 2  sprays into both nostrils daily. 16 g 6  . hydroxypropyl methylcellulose / hypromellose (ISOPTO TEARS / GONIOVISC) 2.5 % ophthalmic solution Place 1 drop into both eyes daily as needed for dry eyes.    . iron polysaccharides (FERREX 150) 150 MG capsule Take 1 capsule (150 mg total) by mouth daily. (Needs to be seen) (Patient taking differently: Take 150 mg by mouth every morning. (Needs to be seen)) 90 capsule 3  . loratadine (CLARITIN) 10 MG tablet TAKE 1 TABLET DAILY 30 tablet 5  . Melatonin 3 MG TABS Take 3 mg by mouth at bedtime.     . Multiple Vitamin (MULTIVITAMIN WITH MINERALS) TABS tablet Take 1 tablet by mouth at bedtime.     . Multiple Vitamins-Minerals (PRESERVISION AREDS 2 PO) Take 1 tablet by mouth 2 (two) times daily.     . multivitamin (RENA-VIT) TABS tablet Take 1 tablet by mouth at bedtime. 30 tablet 0  . nicotine (NICODERM CQ - DOSED IN MG/24 HOURS) 14 mg/24hr patch Place 1 patch (14 mg total) onto the skin daily. 28 patch 2  . Nutritional Supplements (FEEDING SUPPLEMENT, NEPRO CARB STEADY,) LIQD Take 237 mLs by mouth 2 (two) times daily between meals. 60 Can 0  . omega-3 acid ethyl esters (LOVAZA) 1 g capsule Take 2 g by mouth 2 (two) times daily.    . pantoprazole (  PROTONIX) 40 MG tablet TAKE (1) TABLET TWICE A DAY. (Patient taking differently: Take 40 mg by mouth 2 (two) times daily. ) 180 tablet 1  . terazosin (HYTRIN) 2 MG capsule TAKE 1 CAPSULE AT BEDTIME (Patient taking differently: Take 2 mg by mouth at bedtime. ) 90 capsule 0  . tiotropium (SPIRIVA) 18 MCG inhalation capsule Place 18 mcg into inhaler and inhale daily as needed (for shortness of breath).     . warfarin (COUMADIN) 2 MG tablet Take 1 tablet (2 mg total) by mouth daily. 30 tablet 3   No current facility-administered medications for this visit.     REVIEW OF SYSTEMS:  [X]  denotes positive finding, [ ]  denotes negative finding Vascular    Leg swelling    Cardiac    Chest pain or chest pressure:     Shortness of breath upon exertion:    Short of breath when lying flat:    Irregular heart rhythm:    Constitutional    Fever or chills:     PHYSICAL EXAM:   Vitals:   03/12/19 1133  BP: (!) 89/56  Pulse: 96  Resp: 20  Temp: (!) 97 F (36.1 C)  SpO2: 95%  Weight: 153 lb (69.4 kg)  Height: 5\' 9"  (1.753 m)    GENERAL: The patient is a well-nourished male, in no acute distress. The vital signs are documented above. CARDIOVASCULAR: There is a regular rate and rhythm. PULMONARY: There is good air exchange bilaterally without wheezing or rales. His fistula has a good thrill although the vein is not especially large.  It is not especially pulsatile either. He has a palpable left radial pulse.  DATA:   DUPLEX LEFT AV FISTULA: I have independently interpreted his duplex of the left AV fistula.  The diameters range from 0.19 in the proximal upper extremity to 0.6 in the mid upper extremity.  There are elevated velocities in the proximal upper extremity thought to be secondary to a change in diameter.  MEDICAL ISSUES:   END-STAGE RENAL DISEASE: This patient's left basilic vein transposition is still not adequate for use for dialysis.  He has some narrowing it appears in the upper arm and I have recommended we proceed with a fistulogram.  He is on 2 mg a day of Coumadin and he took his last dose this morning.  We will have him come in Friday late morning and try to proceed with fistulogram assuming that his INR is reasonable.  I have discussed the indications of the procedure and the potential complications.  He understands we will proceed with venoplasty if we find a stenosis amenable to this.  Hopefully this will allow further maturation of his fistula so that we get his catheter out in the near future.  [name] [facility] [phone]

## 2019-03-13 ENCOUNTER — Telehealth: Payer: Medicare HMO | Admitting: *Deleted

## 2019-03-14 ENCOUNTER — Encounter (HOSPITAL_COMMUNITY): Payer: Self-pay

## 2019-03-14 ENCOUNTER — Ambulatory Visit (HOSPITAL_COMMUNITY)
Admission: RE | Admit: 2019-03-14 | Discharge: 2019-03-14 | Disposition: A | Discharge status: Home / Self Care | Payer: No Typology Code available for payment source | Attending: Vascular Surgery | Admitting: Vascular Surgery

## 2019-03-14 ENCOUNTER — Other Ambulatory Visit: Payer: Self-pay

## 2019-03-14 ENCOUNTER — Encounter (HOSPITAL_COMMUNITY)
Admission: RE | Disposition: A | Discharge status: Home / Self Care | Payer: Self-pay | Source: Home / Self Care | Attending: Vascular Surgery

## 2019-03-14 DIAGNOSIS — N186 End stage renal disease: Secondary | ICD-10-CM | POA: Diagnosis not present

## 2019-03-14 DIAGNOSIS — Z79899 Other long term (current) drug therapy: Secondary | ICD-10-CM | POA: Insufficient documentation

## 2019-03-14 DIAGNOSIS — Z7951 Long term (current) use of inhaled steroids: Secondary | ICD-10-CM | POA: Insufficient documentation

## 2019-03-14 DIAGNOSIS — Z992 Dependence on renal dialysis: Secondary | ICD-10-CM | POA: Insufficient documentation

## 2019-03-14 DIAGNOSIS — T82898A Other specified complication of vascular prosthetic devices, implants and grafts, initial encounter: Secondary | ICD-10-CM | POA: Diagnosis not present

## 2019-03-14 DIAGNOSIS — Z7901 Long term (current) use of anticoagulants: Secondary | ICD-10-CM | POA: Insufficient documentation

## 2019-03-14 HISTORY — PX: PERIPHERAL VASCULAR BALLOON ANGIOPLASTY: CATH118281

## 2019-03-14 LAB — POCT I-STAT 4, (NA,K, GLUC, HGB,HCT)
Glucose, Bld: 83 mg/dL (ref 70–99)
HCT: 36 % — ABNORMAL LOW (ref 39.0–52.0)
Hemoglobin: 12.2 g/dL — ABNORMAL LOW (ref 13.0–17.0)
Potassium: 4.5 mmol/L (ref 3.5–5.1)
Sodium: 137 mmol/L (ref 135–145)

## 2019-03-14 LAB — PROTIME-INR
INR: 1.1 (ref 0.8–1.2)
Prothrombin Time: 14.2 seconds (ref 11.4–15.2)

## 2019-03-14 SURGERY — PERIPHERAL VASCULAR BALLOON ANGIOPLASTY
Anesthesia: LOCAL | Laterality: Left

## 2019-03-14 MED ORDER — SODIUM CHLORIDE 0.9 % IV SOLN: 250.0000 mL | INTRAVENOUS | Status: DC | PRN

## 2019-03-14 MED ORDER — HEPARIN SODIUM (PORCINE) 1000 UNIT/ML IJ SOLN
INTRAMUSCULAR | Status: AC
  Filled 2019-03-14: qty 1

## 2019-03-14 MED ORDER — HEPARIN (PORCINE) IN NACL 1000-0.9 UT/500ML-% IV SOLN
INTRAVENOUS | Status: DC | PRN
  Administered 2019-03-14: 500 mL

## 2019-03-14 MED ORDER — SODIUM CHLORIDE 0.9% FLUSH: 3.0000 mL | Freq: Two times a day (BID) | INTRAVENOUS | Status: DC

## 2019-03-14 MED ORDER — HEPARIN SODIUM (PORCINE) 1000 UNIT/ML IJ SOLN
INTRAMUSCULAR | Status: DC | PRN
  Administered 2019-03-14: 3000 [IU] via INTRAVENOUS

## 2019-03-14 MED ORDER — LIDOCAINE HCL (PF) 1 % IJ SOLN
INTRAMUSCULAR | Status: DC | PRN
  Administered 2019-03-14: 2 mL via INTRADERMAL

## 2019-03-14 MED ORDER — SODIUM CHLORIDE 0.9% FLUSH: 3.0000 mL | INTRAVENOUS | Status: DC | PRN

## 2019-03-14 MED ORDER — LIDOCAINE HCL (PF) 1 % IJ SOLN
INTRAMUSCULAR | Status: AC
  Filled 2019-03-14: qty 30

## 2019-03-14 MED ORDER — HEPARIN (PORCINE) IN NACL 1000-0.9 UT/500ML-% IV SOLN
INTRAVENOUS | Status: AC
  Filled 2019-03-14: qty 500

## 2019-03-14 MED ORDER — IODIXANOL 320 MG/ML IV SOLN
INTRAVENOUS | Status: DC | PRN
  Administered 2019-03-14: 60 mL via INTRAVENOUS

## 2019-03-14 SURGICAL SUPPLY — 18 items
BAG SNAP BAND KOVER 36X36 (MISCELLANEOUS) IMPLANT
BALLN MUSTANG 5.0X40 75 (BALLOONS) ×2
BALLN MUSTANG 6.0X40 75 (BALLOONS) ×2
BALLN MUSTANG 7.0X40 75 (BALLOONS) ×2
BALLOON MUSTANG 5.0X40 75 (BALLOONS) ×1 IMPLANT
BALLOON MUSTANG 6.0X40 75 (BALLOONS) ×1 IMPLANT
BALLOON MUSTANG 7.0X40 75 (BALLOONS) ×1 IMPLANT
COVER DOME SNAP 22 D (MISCELLANEOUS) ×2 IMPLANT
KIT ENCORE 26 ADVANTAGE (KITS) ×2 IMPLANT
KIT MICROPUNCTURE NIT STIFF (SHEATH) ×2 IMPLANT
PROTECTION STATION PRESSURIZED (MISCELLANEOUS) ×2
SHEATH PINNACLE R/O II 6F 4CM (SHEATH) ×2 IMPLANT
SHEATH PROBE COVER 6X72 (BAG) ×4 IMPLANT
STATION PROTECTION PRESSURIZED (MISCELLANEOUS) ×1 IMPLANT
STOPCOCK MORSE 400PSI 3WAY (MISCELLANEOUS) ×2 IMPLANT
TRAY PV CATH (CUSTOM PROCEDURE TRAY) ×2 IMPLANT
TUBING CIL FLEX 10 FLL-RA (TUBING) ×2 IMPLANT
WIRE BENTSON .035X145CM (WIRE) ×2 IMPLANT

## 2019-03-14 NOTE — Discharge Instructions (Signed)

## 2019-03-14 NOTE — Interval H&P Note (Signed)
History and Physical Interval Note:  03/14/2019 12:12 PM  Don [name]  has presented today for surgery, with the diagnosis of Poor flow.  The various methods of treatment have been discussed with the patient and family. After consideration of risks, benefits and other options for treatment, the patient has consented to  Procedure(s): A/V FISTULAGRAM (Left) as a surgical intervention.  [name] history has been reviewed, patient examined, no change in status, stable for surgery.  I have reviewed the patient's chart and labs.  Questions were answered to the patient's satisfaction.     [name]

## 2019-03-14 NOTE — Op Note (Signed)
   PATIENT: Don [name]      MRN: [id number] DOB: June 27, 1941    DATE OF PROCEDURE: 03/14/2019  INDICATIONS:    Don [name] is a 78 y.o. male who had a basilic vein transposition on 12/16/2018.  On follow-up visit on 03/12/2019 the vein was still somewhat small and had an area of narrowing noted on duplex.  He presents for a fistulogram and possible venoplasty  PROCEDURE:    1.  Ultrasound-guided access to left basilic vein transposition 2.  Fistulogram left basilic vein transposition 3.  Venoplasty of 3 stenoses within the fistula  SURGEON: Judeth Cornfield. Scot Dock, MD, FACS  ANESTHESIA: Local  EBL: Minimal  TECHNIQUE: The patient was brought to the peripheral vascular lab.  The left arm was prepped and draped in usual sterile fashion.  Under ultrasound guidance, after the skin was anesthetized, the left basilic vein transposition was cannulated proximally with a micropuncture needle and a micropuncture sheath introduced over a wire.  Fistulogram was then obtained to evaluate the fistula from the point of cannulation to include the central veins.  There were 3 areas of stenosis noted.  I felt that these could be addressed with balloon angioplasty.  The micropuncture sheath was exchanged for a short 6 [place] sheath.  The patient was heparinized with 3000 units of IV heparin.  Initially I selected a 5 mm x 4 cm balloon.  This was inflated across the areas of stenosis to 24 atm for 1 minute.  Follow-up films showed some residual stenosis.  I went back with a 6 mm x 4 cm balloon and these were also inflated over all 3 areas.  Follow-up film again showed some residual stenosis in the most centrally located lesion and I went back with a 7 mm x 4 cm balloon which was inflated to 18 atm for 1 minute.  This took to 7.3 mm.  Follow-up films showed an excellent result.  A 4-0 Monocryl suture was placed around the cannulation site after the wire was removed.  Pressure was held for hemostasis.  No immediate  complications were noted.  FINDINGS:   1.  There is no central venous stenosis. 2.  There is no narrowing at the arterial anastomosis. 3.  There were 3 areas of narrowing which was successfully addressed with venoplasty.   CLINICAL NOTE: The fistula can be used starting June 1.  [name], MD, FACS [facility]  DATE OF DICTATION:   03/14/2019

## 2019-03-17 ENCOUNTER — Encounter (HOSPITAL_COMMUNITY): Payer: Self-pay | Admitting: Vascular Surgery

## 2019-03-18 ENCOUNTER — Telehealth: Payer: Self-pay

## 2019-03-18 DIAGNOSIS — I480 Paroxysmal atrial fibrillation: Secondary | ICD-10-CM | POA: Diagnosis not present

## 2019-03-18 DIAGNOSIS — Z7901 Long term (current) use of anticoagulants: Secondary | ICD-10-CM | POA: Diagnosis not present

## 2019-03-18 MED ORDER — WARFARIN SODIUM 2 MG PO TABS: 3.0000 mg | ORAL_TABLET | Freq: Every day | ORAL | 3 refills | Status: DC

## 2019-03-18 NOTE — Telephone Encounter (Signed)
Spoke with [name] (son) and he states his dad held his warfarin 4/30 & 5/1 and started It back 5/2.

## 2019-03-18 NOTE — Telephone Encounter (Signed)
Patient's son aware of dosage increase.

## 2019-03-18 NOTE — Telephone Encounter (Signed)
Increase to 3 mg (1 1/2 tablets) every day. Repeat INR in 2 week.

## 2019-03-18 NOTE — Telephone Encounter (Signed)
Med INR result INR 1.1 Patient taking 2 mg daily  Please call patient's son with any changes

## 2019-03-18 NOTE — Telephone Encounter (Signed)
Pt had a procedure on 03/14/19 did he hold is warfarin for this and for how long?

## 2019-03-18 NOTE — Addendum Note (Signed)
Addended by: Wardell Heath on: 03/18/2019 04:21 PM   Modules accepted: Orders

## 2019-03-21 ENCOUNTER — Encounter: Payer: Self-pay | Admitting: Nurse Practitioner

## 2019-03-21 ENCOUNTER — Ambulatory Visit (INDEPENDENT_AMBULATORY_CARE_PROVIDER_SITE_OTHER): Payer: Medicare HMO | Admitting: Nurse Practitioner

## 2019-03-21 ENCOUNTER — Other Ambulatory Visit: Payer: Self-pay

## 2019-03-21 DIAGNOSIS — J309 Allergic rhinitis, unspecified: Secondary | ICD-10-CM | POA: Diagnosis not present

## 2019-03-21 NOTE — Progress Notes (Signed)
   Virtual Visit via telephone Note  I connected with Don [name] on 03/21/19 at 3:30PM by telephone and verified that I am speaking with the correct person using two identifiers. Don [name] is currently located at home  and no one is currently with her during visit. The provider, Mary-Margaret Hassell Done, FNP is located in their office at time of visit.  I discussed the limitations, risks, security and privacy concerns of performing an evaluation and management service by telephone and the availability of in person appointments. I also discussed with the patient that there may be a patient responsible charge related to this service. The patient expressed understanding and agreed to proceed.   History and Present Illness:   Chief Complaint: Allergies   HPI Patient calls in today c/o coughing and sneezing spells. Has been going on for 2 weeks intermittently. He is on dialysis and they told him he needed something for his allergies. It only occurs when he is doing his dialysis    Review of Systems  Constitutional: Negative.   HENT: Positive for congestion.   Respiratory: Negative.   Cardiovascular: Negative.   Genitourinary: Negative.   Skin: Negative.   Neurological: Negative.   Psychiatric/Behavioral: Negative.   All other systems reviewed and are negative.    Observations/Objective: Alert and oriented- answers all questions at home No distress- no cough during call.  Assessment and Plan: Don [name] in today with chief complaint of Allergies   1. Allergic rhinitis, unspecified seasonality, unspecified trigger claritin OTC- take prior to going dialysis   Follow Up Instructions:     I discussed the assessment and treatment plan with the patient. The patient was provided an opportunity to ask questions and all were answered. The patient agreed with the plan and demonstrated an understanding of the instructions.   The patient was advised to call back or seek an  in-person evaluation if the symptoms worsen or if the condition fails to improve as anticipated.  The above assessment and management plan was discussed with the patient. The patient verbalized understanding of and has agreed to the management plan. Patient is aware to call the clinic if symptoms persist or worsen. Patient is aware when to return to the clinic for a follow-up visit. Patient educated on when it is appropriate to go to the emergency department.   Time call ended:  3:43  I provided 15 minutes of non-face-to-face time during this encounter.    Mary-Margaret Hassell Done, FNP

## 2019-03-25 ENCOUNTER — Telehealth: Payer: Self-pay | Admitting: Family Medicine

## 2019-03-25 NOTE — Telephone Encounter (Signed)
What is the name of the medication? Muscle relaxer  Have you contacted your pharmacy to request a refill? yes  Which pharmacy would you like this sent to? [facility]   Patient notified that their request is being sent to the clinical staff for review and that they should receive a call once it is complete. If they do not receive a call within 24 hours they can check with their pharmacy or our office.

## 2019-03-26 ENCOUNTER — Other Ambulatory Visit: Payer: Self-pay | Admitting: Family Medicine

## 2019-03-26 ENCOUNTER — Telehealth: Payer: Self-pay | Admitting: Family Medicine

## 2019-03-26 MED ORDER — CYCLOBENZAPRINE HCL 10 MG PO TABS: 10.0000 mg | ORAL_TABLET | Freq: Three times a day (TID) | ORAL | 0 refills | Status: DC | PRN

## 2019-03-26 NOTE — Telephone Encounter (Signed)
Aware. 

## 2019-03-26 NOTE — Telephone Encounter (Signed)
I sent a refill of his muscle relaxer Flexeril for him

## 2019-03-26 NOTE — Telephone Encounter (Signed)
Spoke to pharmacy they were doing his weekly pill packs and wanted to verify dose of Coumadin prior to packaging weekly meds. Advised current dose is still 3mg  daily (2mg  tabs take 1 and 1/2 tab) pharmacy voiced understanding.

## 2019-03-27 ENCOUNTER — Telehealth: Payer: Self-pay | Admitting: *Deleted

## 2019-03-27 NOTE — Telephone Encounter (Signed)
Pharmacist aware of dose change

## 2019-03-27 NOTE — Telephone Encounter (Signed)
Fax received mdINR PT/INR self testing service Test date/time 03/27/19 9:15 am INR 1.3

## 2019-03-27 NOTE — Telephone Encounter (Signed)
Description   Goal INR 2.0-3.0 New transition from Eliquis, INR 1.3 Increase dose to take 4 mg tonight and continue taking 4 on Saturdays Sundays Tuesdays and Thursdays and take 3 mg the rest of the week      I changed his dose for the Coumadin, can we please call [facility] and get the changes in his pill pack Caryl Pina, MD [place] 03/27/2019, 3:49 PM

## 2019-04-01 ENCOUNTER — Telehealth: Payer: Self-pay | Admitting: *Deleted

## 2019-04-01 ENCOUNTER — Ambulatory Visit (INDEPENDENT_AMBULATORY_CARE_PROVIDER_SITE_OTHER): Payer: Medicare HMO | Admitting: Licensed Clinical Social Worker

## 2019-04-01 DIAGNOSIS — N186 End stage renal disease: Secondary | ICD-10-CM | POA: Diagnosis not present

## 2019-04-01 DIAGNOSIS — F339 Major depressive disorder, recurrent, unspecified: Secondary | ICD-10-CM

## 2019-04-01 DIAGNOSIS — J441 Chronic obstructive pulmonary disease with (acute) exacerbation: Secondary | ICD-10-CM | POA: Diagnosis not present

## 2019-04-01 DIAGNOSIS — R531 Weakness: Secondary | ICD-10-CM

## 2019-04-01 DIAGNOSIS — Z992 Dependence on renal dialysis: Secondary | ICD-10-CM

## 2019-04-01 DIAGNOSIS — W19XXXD Unspecified fall, subsequent encounter: Secondary | ICD-10-CM

## 2019-04-01 DIAGNOSIS — R413 Other amnesia: Secondary | ICD-10-CM

## 2019-04-01 DIAGNOSIS — I10 Essential (primary) hypertension: Secondary | ICD-10-CM

## 2019-04-01 DIAGNOSIS — R296 Repeated falls: Secondary | ICD-10-CM

## 2019-04-01 NOTE — Patient Instructions (Signed)
Licensed Clinical Social Worker Visit Information  Goals we discussed today:  Goals Addressed            This Visit's Progress   . "I get sad sometimes because I live alone" (pt-stated)       Current Barriers:   Client has transportation needs . Client has some ambulation challenges (uses cane to help with ambulation) . Social isolation issues  Clinical Social Work Clinical Goal(s): Over the next 30 days, client will verbalize understanding of depression symptoms management.  Interventions: . Previously discussed self care activities: sleep hygiene, basic healthy eating practices . Previously encouraged client to participate in recreational/relaxation activities of choice  . Encouraged client or POA to call LCSW as needed to discuss management of depression symptoms of client . Previously encouraged client to use relaxation techniques (watching TV,talking via phone with [name] ([place]) . Encouraged client to attend scheduled client medical appointments  Patient Self Care Activities:  . Client socializes with family weekly as scheduled.  . Client contacts [name], step-son, POA, as needed for daily needs/assistance of client. . Client takes medications as prescribed.  Plan:  . LCSW to contact client/POA [name]) in three weeks to discuss client needs related to depression management.  [name]) to contact LCSW related to psychosocial needs of client . [name] Client to use relaxation techniques of choice to help him manage depression symptoms.  . Client to attend scheduled medical appointments  .  Client or POA [name] to communicate with RN CM as needed to discuss nursing needs of client       Materials Provided: No  Follow Up Plan: LCSW to contact client/POA [name]) in next 3 weeks to discuss client needs related to depression management  The patient/POA verbalized understanding of instructions provided today and declined a print copy of  patient instruction materials.   [name].Lincy Belles MSW, LCSW Licensed Clinical Social Worker [facility] [phone]

## 2019-04-01 NOTE — Telephone Encounter (Signed)
Fax received mdINR PT/INR self testing service Test date/time 04/01/19 10:12 am INR 1.4

## 2019-04-01 NOTE — Telephone Encounter (Signed)
Goal INR 2.0-3.0  INR today is 1.4 (too thick)  Take 6 mg today, then start 4 mg every day. Recheck in 10 days.

## 2019-04-01 NOTE — Chronic Care Management (AMB) (Signed)
  Care Management Note   Don [name] is a 78 y.o. year old male who is a primary care patient of Dettinger, Fransisca Kaufmann, MD. The CM team was consulted for assistance with chronic disease management and care coordination.   I reached out to Don [name] by phone today  Review of patient status, including review of consultants reports, relevant laboratory and other test results, and collaboration with appropriate care team members and the patient's provider was performed as part of comprehensive patient evaluation and provision of chronic care management services.    Social Determinants of Health: Risk for Social Isolation; Risk for Depression    Chronic Care Management from 12/23/2018 in [facility]  PHQ-9 Total Score  9      GAD 7 : Generalized Anxiety Score 05/25/2016  Nervous, Anxious, on Edge 1  Control/stop worrying 2  Worry too much - different things 2  Trouble relaxing 1  Restless 0  Easily annoyed or irritable 1  Afraid - awful might happen 0  Total GAD 7 Score 7  Anxiety Difficulty Not difficult at all    Goals Addressed            This Visit's Progress   . "I get sad sometimes because I live alone" (pt-stated)       Current Barriers:   Client has transportation needs . Client has some ambulation challenges (uses cane to help with ambulation) . Social isolation issues  Clinical Social Work Clinical Goal(s): Over the next 30 days, client will verbalize understanding of depression symptoms management.  Interventions: . Previously discussed self care activities: sleep hygiene, basic healthy eating practices for client Previously encouraged client to participate in recreational/relaxation activities of choice  . Previously encouraged client or POA to call LCSW as needed to discuss management of depression symptoms of client . Previously encouraged client to use relaxation techniques (watching TV,talking via phone with Don [name] ([place]) .  Encouraged client to attend scheduled client medical appointments  Patient Self Care Activities:  . Client socializes with family weekly as scheduled.  . Client contacts Don [name], step-son, POA, as needed for daily needs/assistance of client. . Client takes medications as prescribed.  Plan:  . LCSW to contact client/POA Don [name]) in three weeks to discuss client needs related to depression management.  [name]) to contact LCSW related to psychosocial needs of client . [name] Client to use relaxation techniques of choice to help him manage depression symptoms.  . Client to attend scheduled medical appointments . Client or POA Don [name] to communicate with RN CM as needed to discuss nursing needs of client     Client participates in dialysis treatments weekly as scheduled. Client is taking medications as prescribed. Client is eating adequately and sleeping adequately. Client has support from POA, step son, Don [name]. LCSW has talked several times with client about client uses of relaxation techniques to help client manage depression symptoms. LCSW has encouraged client to socialize with family or friends to help with stress management of client.  Client is still at risk for occasional social isolation  Follow Up Plan: LCSW to contact client/POA Don [name] in next 3 weeks to discuss client needs related to depression management  [name].Adenike Shidler MSW, LCSW Licensed Clinical Social Worker [facility] [phone]

## 2019-04-01 NOTE — Telephone Encounter (Signed)
Aware. 

## 2019-04-02 ENCOUNTER — Telehealth: Payer: Self-pay | Admitting: Physician Assistant

## 2019-04-02 NOTE — Telephone Encounter (Signed)
Patient has a follow up appointment scheduled. 

## 2019-04-08 ENCOUNTER — Ambulatory Visit (INDEPENDENT_AMBULATORY_CARE_PROVIDER_SITE_OTHER): Payer: Medicare HMO | Admitting: Family Medicine

## 2019-04-08 ENCOUNTER — Encounter: Payer: Self-pay | Admitting: Family Medicine

## 2019-04-08 ENCOUNTER — Other Ambulatory Visit: Payer: Self-pay

## 2019-04-08 VITALS — BP 103/65 | HR 78 | Temp 97.0°F | Ht 69.0 in | Wt 160.4 lb

## 2019-04-08 DIAGNOSIS — I4891 Unspecified atrial fibrillation: Secondary | ICD-10-CM

## 2019-04-08 DIAGNOSIS — B351 Tinea unguium: Secondary | ICD-10-CM | POA: Diagnosis not present

## 2019-04-08 LAB — COAGUCHEK XS/INR WAIVED
INR: 1.8 — ABNORMAL HIGH (ref 0.9–1.1)
Prothrombin Time: 21.3 s

## 2019-04-08 MED ORDER — DILTIAZEM HCL ER COATED BEADS 120 MG PO CP24: 120.0000 mg | ORAL_CAPSULE | Freq: Every day | ORAL | 3 refills | Status: AC

## 2019-04-08 MED ORDER — TERBINAFINE HCL 250 MG PO TABS: ORAL_TABLET | ORAL | 1 refills | Status: DC

## 2019-04-08 NOTE — Patient Instructions (Addendum)
Discussed with the dialysis team if they want him to hold Cardizem until after dialysis on those days to make sure he gets a full treatment to help with hypotension.   Reduce his diltiazem for him.

## 2019-04-08 NOTE — Progress Notes (Signed)
BP 103/65   Pulse 78   Temp (!) 97 F (36.1 C) (Oral)   Ht 5\' 9"  (1.753 m)   Wt 160 lb 6.4 oz (72.8 kg)   BMI 23.69 kg/m    Subjective:   Patient ID: Don [name], male    DOB: [birth year], 78 y.o.   MRN: [id number]  HPI: Don [name] is a 78 y.o. male presenting on 04/08/2019 for Coagulation Disorder (Patient states that left big toe nail has been falling off.) and Fatigue (Patient states it has been going on for a few months.)   HPI Patient is coming in with A. fib and RVR and Coumadin recheck. Patient has been having hypotension and fatigue and some lightheadedness more recently.  His blood pressure was 103/65 and has been down to that amount consistently and he just feels like his energy is down.  His heart rate today is 78 and he is on the diltiazem and we will try and reduce that to see if that will help.  Patient is at high risk for falls.  Patient denies any chest pain or shortness of breath but just has left fatigue.  Patient has thickened and yellowed toenails His great toenail on his left foot has been coming off and has been having trouble with this for quite some time, maybe in a couple years.  Patient denies any pain or redness or warmth but just has the yellowing and where the toenail has come off.  Relevant past medical, surgical, family and social history reviewed and updated as indicated. Interim medical history since our last visit reviewed. Allergies and medications reviewed and updated.  Review of Systems  Constitutional: Positive for fatigue. Negative for chills and fever.  Eyes: Negative for visual disturbance.  Respiratory: Negative for shortness of breath and wheezing.   Cardiovascular: Negative for chest pain and leg swelling.  Musculoskeletal: Negative for back pain and gait problem.  Skin: Positive for color change (Toenail color change on left toe). Negative for rash.  Neurological: Positive for dizziness, weakness (Generalized) and light-headedness.   All other systems reviewed and are negative.   Per HPI unless specifically indicated above   Allergies as of 04/08/2019      Reactions   Wellbutrin [bupropion] Anxiety      Medication List       Accurate as of Apr 08, 2019  9:38 AM. If you have any questions, ask your nurse or doctor.        acetaminophen 500 MG tablet Commonly known as:  TYLENOL Take 500 mg by mouth every 6 (six) hours as needed for moderate pain.   apixaban 5 MG Tabs tablet Commonly known as:  Eliquis Take 1 tablet (5 mg total) by mouth 2 (two) times daily.   atorvastatin 20 MG tablet Commonly known as:  LIPITOR Take 1 tablet (20 mg total) by mouth daily. (Needs to be seen) What changed:    when to take this  additional instructions   bisoprolol 10 MG tablet Commonly known as:  ZEBETA TAKE (1) TABLET TWICE A DAY. What changed:  See the new instructions.   budesonide-formoterol 160-4.5 MCG/ACT inhaler Commonly known as:  SYMBICORT Inhale 2 puffs into the lungs 2 (two) times daily.   calcitRIOL 0.25 MCG capsule Commonly known as:  ROCALTROL Take 1 capsule (0.25 mcg total) by mouth every morning.   clonazePAM 1 MG tablet Commonly known as:  KLONOPIN Take 0.5 tablets (0.5 mg total) by mouth at bedtime.   cyclobenzaprine  10 MG tablet Commonly known as:  FLEXERIL Take 1 tablet (10 mg total) by mouth 3 (three) times daily as needed for muscle spasms.   diltiazem 120 MG 24 hr capsule Commonly known as:  Cardizem CD Take 1 capsule (120 mg total) by mouth daily. What changed:    medication strength  how much to take  additional instructions Changed by:  Fransisca Kaufmann Malerie Eakins, MD   feeding supplement (NEPRO CARB STEADY) Liqd Take 237 mLs by mouth 2 (two) times daily between meals.   ferrous sulfate 325 (65 FE) MG tablet Take 325 mg by mouth every Monday, Wednesday, and Friday.   fluticasone 50 MCG/ACT nasal spray Commonly known as:  FLONASE Place 2 sprays into both nostrils daily.    hydroxypropyl methylcellulose / hypromellose 2.5 % ophthalmic solution Commonly known as:  ISOPTO TEARS / GONIOVISC Place 1 drop into both eyes daily as needed for dry eyes.   iron polysaccharides 150 MG capsule Commonly known as:  Ferrex 150 Take 1 capsule (150 mg total) by mouth daily. (Needs to be seen) What changed:  when to take this   loratadine 10 MG tablet Commonly known as:  CLARITIN TAKE 1 TABLET DAILY   Melatonin 3 MG Tabs Take 3 mg by mouth at bedtime.   multivitamin Tabs tablet Take 1 tablet by mouth at bedtime.   multivitamin with minerals Tabs tablet Take 1 tablet by mouth at bedtime.   nicotine 14 mg/24hr patch Commonly known as:  NICODERM CQ - dosed in mg/24 hours Place 1 patch (14 mg total) onto the skin daily.   omega-3 acid ethyl esters 1 g capsule Commonly known as:  LOVAZA Take 2 g by mouth 2 (two) times daily.   pantoprazole 40 MG tablet Commonly known as:  PROTONIX TAKE (1) TABLET TWICE A DAY.   PRESERVISION AREDS 2 PO Take 1 tablet by mouth 2 (two) times daily.   ProAir HFA 108 (90 Base) MCG/ACT inhaler Generic drug:  albuterol Inhale 2 puffs into the lungs 2 (two) times daily.   terazosin 2 MG capsule Commonly known as:  HYTRIN TAKE 1 CAPSULE AT BEDTIME   terbinafine 250 MG tablet Commonly known as:  LAMISIL Take it 3 times weekly on dialysis day after dialysis Started by:  Worthy Rancher, MD   tiotropium 18 MCG inhalation capsule Commonly known as:  SPIRIVA Place 18 mcg into inhaler and inhale daily as needed (for shortness of breath).   warfarin 2 MG tablet Commonly known as:  COUMADIN Take as directed by the anticoagulation clinic. If you are unsure how to take this medication, talk to your nurse or doctor. Original instructions:  Take 1.5 tablets (3 mg total) by mouth daily.        Objective:   BP 103/65   Pulse 78   Temp (!) 97 F (36.1 C) (Oral)   Ht 5\' 9"  (1.753 m)   Wt 160 lb 6.4 oz (72.8 kg)   BMI 23.69  kg/m   Wt Readings from Last 3 Encounters:  04/08/19 160 lb 6.4 oz (72.8 kg)  03/14/19 149 lb (67.6 kg)  03/12/19 153 lb (69.4 kg)    Physical Exam Vitals signs and nursing note reviewed.  Constitutional:      General: He is not in acute distress.    Appearance: He is well-developed. He is not diaphoretic.  Eyes:     General: No scleral icterus.    Conjunctiva/sclera: Conjunctivae normal.  Neck:     Musculoskeletal: Neck  supple.     Thyroid: No thyromegaly.  Cardiovascular:     Rate and Rhythm: Normal rate. Rhythm irregular.  Pulmonary:     Effort: Pulmonary effort is normal. No respiratory distress.     Breath sounds: Normal breath sounds. No wheezing.  Musculoskeletal:        General: No swelling.  Lymphadenopathy:     Cervical: No cervical adenopathy.  Skin:    General: Skin is warm and dry.     Findings: No rash.     Comments: Thickened and yellowed toenail on the left great toe  Neurological:     Mental Status: He is alert and oriented to person, place, and time.     Coordination: Coordination normal.  Psychiatric:        Behavior: Behavior normal.       Assessment & Plan:   Problem List Items Addressed This Visit      Cardiovascular and Mediastinum   Atrial fibrillation with RVR (HCC) - Primary   Relevant Medications   diltiazem (CARDIZEM CD) 120 MG 24 hr capsule   Other Relevant Orders   CoaguChek XS/INR Waived (Completed)    Other Visit Diagnoses    Onychomycosis       Relevant Medications   terbinafine (LAMISIL) 250 MG tablet      Patient has fatigue on dialysis days but has done better when he is gotten the full treatment and does worse when he does not get the full treatments,  Reduce his diltiazem to see if that helps to prevent hypotension and helps him get full dialysis. Follow up plan: Return in about 4 weeks (around 05/06/2019), or if symptoms worsen or fail to improve, for INR recheck.  Counseling provided for all of the vaccine  components Orders Placed This Encounter  Procedures  . CoaguChek XS/INR [place], MD [facility] 04/08/2019, 9:38 AM

## 2019-04-09 ENCOUNTER — Telehealth: Payer: Self-pay | Admitting: Family Medicine

## 2019-04-09 NOTE — Telephone Encounter (Signed)
Pharmacy aware of dose per office visit 5/26.

## 2019-04-15 ENCOUNTER — Telehealth: Payer: Self-pay | Admitting: *Deleted

## 2019-04-15 ENCOUNTER — Other Ambulatory Visit: Payer: Self-pay | Admitting: Family Medicine

## 2019-04-15 DIAGNOSIS — Z7901 Long term (current) use of anticoagulants: Secondary | ICD-10-CM | POA: Diagnosis not present

## 2019-04-15 DIAGNOSIS — I480 Paroxysmal atrial fibrillation: Secondary | ICD-10-CM | POA: Diagnosis not present

## 2019-04-15 MED ORDER — WARFARIN SODIUM 2 MG PO TABS: ORAL_TABLET | ORAL | 3 refills | Status: DC

## 2019-04-15 NOTE — Telephone Encounter (Signed)
Fax received mdINR PT/INR self testing service Test date/time 04/15/19 10:13 am INR 1.6

## 2019-04-15 NOTE — Telephone Encounter (Signed)
Spoke to [employer] over the telephone.  I am recommending that we increase his Coumadin dose to 4 mg daily except for Fridays he can take 3 mg.  I will send the updated instructions to the pharmacy as well.  He is to repeat INR in 1 week.  May need to consider 4 mg daily dosing if continues to be subtherapeutic.

## 2019-04-15 NOTE — Progress Notes (Signed)
I spoke to Don [name] and he was unaware of what the dose was.  We contacted the pharmacy and patient is taking 3 mg on Fridays and Wednesdays with 4 mg daily all other days.  INR is subtherapeutic.  Therefore, increased to 4 mg daily except for Fridays he can take 3 mg and repeat INR in 1 week.  Meds ordered this encounter  Medications  . warfarin (COUMADIN) 2 MG tablet    Sig: Take 4mg  daily except Fridays take 3mg .    Dispense:  30 tablet    Refill:  3

## 2019-04-22 ENCOUNTER — Telehealth: Payer: Self-pay | Admitting: *Deleted

## 2019-04-22 ENCOUNTER — Ambulatory Visit (INDEPENDENT_AMBULATORY_CARE_PROVIDER_SITE_OTHER): Payer: Medicare HMO | Admitting: Licensed Clinical Social Worker

## 2019-04-22 DIAGNOSIS — Z992 Dependence on renal dialysis: Secondary | ICD-10-CM | POA: Diagnosis not present

## 2019-04-22 DIAGNOSIS — I10 Essential (primary) hypertension: Secondary | ICD-10-CM

## 2019-04-22 DIAGNOSIS — F339 Major depressive disorder, recurrent, unspecified: Secondary | ICD-10-CM

## 2019-04-22 DIAGNOSIS — R531 Weakness: Secondary | ICD-10-CM

## 2019-04-22 DIAGNOSIS — R413 Other amnesia: Secondary | ICD-10-CM

## 2019-04-22 DIAGNOSIS — J441 Chronic obstructive pulmonary disease with (acute) exacerbation: Secondary | ICD-10-CM | POA: Diagnosis not present

## 2019-04-22 DIAGNOSIS — W19XXXD Unspecified fall, subsequent encounter: Secondary | ICD-10-CM

## 2019-04-22 DIAGNOSIS — N186 End stage renal disease: Secondary | ICD-10-CM

## 2019-04-22 NOTE — Telephone Encounter (Signed)
[name] POA notified and verbalized understanding

## 2019-04-22 NOTE — Patient Instructions (Signed)
Licensed Clinical Social Worker Visit Information  Goals we discussed today:  Goals Addressed            This Visit's Progress   . "I get sad sometimes because I live alone" (pt-stated)       Current Barriers:   Client has transportation needs . Client has some ambulation challenges (uses cane to help with ambulation) . Social isolation issues  Clinical Social Work Clinical Goal(s): Over the next 30 days, client will verbalize understanding of depression symptoms management.  Interventions: . Previously discussed self care activities: sleep hygiene, basic healthy eating practices . Previously encouraged client to participate in recreational/relaxation activities of choice  . Encouraged client or POA to call LCSW as needed to discuss management of depression symptoms of client . Previously encouraged client to use relaxation techniques (watching TV,talking via phone with [name] ([place]) . Encouraged client to attend scheduled client medical appointments . Encouraged client/POA to communicate with RN CM as needed to discuss nursing needs of client  Patient Self Care Activities:  . Client socializes with family weekly as scheduled.  . Client contacts [name], step-son, POA, as needed for daily needs/assistance of client. . Client takes medications as prescribed.  Plan:  . LCSW to contact client/POA [name]) in three weeks to discuss client needs related to depression management.  [name]) to contact LCSW related to psychosocial needs of client . [name] Client to use relaxation techniques of choice to help him manage depression symptoms.  . Client to attend scheduled medical appointments . Client or POA [name] to communicate with RN CM as needed to discuss nursing needs of client  .  Client to practice self care activities (eating meals on normal scheduled times, getting enough rest at night)      Materials Provided: No  Follow Up Plan: LCSW to contact  client/POA in next 3 weeks to discuss client needs related to depression management   The patient/POA [name] verbalized understanding of instructions provided today and declined a print copy of patient instruction materials.   [name].Colsen Modi MSW, LCSW Licensed Clinical Social Worker [facility] [phone]

## 2019-04-22 NOTE — Telephone Encounter (Signed)
Goal INR 2.0-3.0  INR today is 2.1 (At goal)  Continue current dose of  3 mg on Wednesday and Friday and 4 mg the rest of the week

## 2019-04-22 NOTE — Chronic Care Management (AMB) (Signed)
Care Management Note   Don [name] is a 78 y.o. year old male who is a primary care patient of Dettinger, Don Kaufmann, MD. The CM team was consulted for assistance with chronic disease management and care coordination.   I reached out to Don [name] by phone today.   Review of patient status, including review of consultants reports, relevant laboratory and other test results, and collaboration with appropriate care team members and the patient's provider was performed as part of comprehensive patient evaluation and provision of chronic care management services.   Social Determinants of Health: Risk for depression; risk for social isolation    Chronic Care Management from 12/23/2018 in [place]  PHQ-9 Total Score  9     GAD 7 : Generalized Anxiety Score 05/25/2016  Nervous, Anxious, on Edge 1  Control/stop worrying 2  Worry too much - different things 2  Trouble relaxing 1  Restless 0  Easily annoyed or irritable 1  Afraid - awful might happen 0  Total GAD 7 Score 7  Anxiety Difficulty Not difficult at all    Goals Addressed            This Visit's Progress   . "I get sad sometimes because I live alone" (pt-stated)       Current Barriers:   Client has transportation needs . Client has some ambulation challenges (uses cane to help with ambulation) . Social isolation issues  Clinical Social Work Clinical Goal(s): Over the next 30 days, client will verbalize understanding of depression symptoms management.  Interventions: . Previously discussed self care activities: sleep hygiene, basic healthy eating practices . Previously encouraged client to participate in recreational/relaxation activities of choice  . Encouraged client or POA to call LCSW as needed to discuss management of depression symptoms of client . Encouraged client to use relaxation techniques (watching TV,talking via phone with Don [name] ([place]) . Encouraged client  to attend scheduled client medical appointments  Patient Self Care Activities:  . Client socializes with family weekly as scheduled.  . Client contacts Don [name], step-son, POA, as needed for daily needs/assistance of client. . Client takes medications as prescribed.  Plan:  . LCSW to contact client/POA Don [name]) in three weeks to discuss client needs related to depression management.  Don [name] [facility]) to contact LCSW related to psychosocial needs of client . [name] Client to use relaxation techniques of choice to help him manage depression symptoms.  . Client to attend scheduled medical appointments . Client or POA Don [name] to communicate with RN CM as needed to discuss nursing needs of client . 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 Client is taking medications as prescribed. Client is eating adequately and sleeping adequately. Client has support from POA, step son, Don [name]. LCSW has talked several times with client about client uses of relaxation techniques to help client manage depression symptoms. LCSW has encouraged client to socialize with family or friends to help with stress management of client.  Client is still at risk for occasional social isolation. Don [name] POA reported that client has been getting fatigued. Client has occasional dizziness as reported on client's last visit with Dr. Warrick Parisian. Client is at risk for falls.  LCSW has encouraged client and Don [name] to talk with [facility] as needed to discuss nursing needs  of client. LCSW has encouraged client and Don [name] to talk with LCSW to discuss psychosocial needs of client. LCSW has talked with client and with Don [name] about CCM program support services  Follow Up Plan: LCSW to call client or POA in next 3 weeks to talk with client or POA about client needs  related to depression management  [name].Davontay [place]lington MSW, LCSW Licensed Clinical Social Worker [facility] Care Management [phone]

## 2019-04-22 NOTE — Telephone Encounter (Signed)
Fax received mdINR PT/INR self testing service Test date/time 04/22/19 10:11 am INR 2.1

## 2019-04-29 ENCOUNTER — Telehealth: Payer: Self-pay | Admitting: Family Medicine

## 2019-04-29 ENCOUNTER — Other Ambulatory Visit: Payer: Self-pay | Admitting: Family Medicine

## 2019-04-29 NOTE — Telephone Encounter (Signed)
Aware and scheduled.

## 2019-04-29 NOTE — Telephone Encounter (Signed)
Full mailbox.   Patient needs an appointment with Dr. Warrick Parisian to have protime rechecked.

## 2019-04-30 ENCOUNTER — Encounter: Payer: Self-pay | Admitting: Family Medicine

## 2019-04-30 ENCOUNTER — Ambulatory Visit: Payer: Medicare HMO | Admitting: Family Medicine

## 2019-04-30 ENCOUNTER — Ambulatory Visit (INDEPENDENT_AMBULATORY_CARE_PROVIDER_SITE_OTHER): Payer: Medicare HMO | Admitting: Family Medicine

## 2019-04-30 ENCOUNTER — Other Ambulatory Visit: Payer: Self-pay

## 2019-04-30 VITALS — BP 111/74 | HR 96 | Temp 96.9°F | Ht 69.0 in | Wt 159.4 lb

## 2019-04-30 DIAGNOSIS — I4891 Unspecified atrial fibrillation: Secondary | ICD-10-CM

## 2019-04-30 LAB — COAGUCHEK XS/INR WAIVED
INR: 1.8 — ABNORMAL HIGH (ref 0.9–1.1)
Prothrombin Time: 22.8 s

## 2019-04-30 NOTE — Progress Notes (Signed)
BP 111/74   Pulse 96   Temp (!) 96.9 F (36.1 C) (Oral)   Ht 5\' 9"  (1.753 m)   Wt 159 lb 6.4 oz (72.3 kg)   BMI 23.54 kg/m    Subjective:   Patient ID: Don [name], male    DOB: October 04, 1941, 78 y.o.   MRN: [id number]  HPI: Don [name] is a 78 y.o. male presenting on 04/30/2019 for Coagulation Disorder   HPI INR recheck, anticoagulation Patient is coming in for INR and anticoagulation recheck clinically denies any bruising or bleeding except for where the open multiple times on his dialysis site.  She has A. fib but denies any chest pain or palpitations today.  Relevant past medical, surgical, family and social history reviewed and updated as indicated. Interim medical history since our last visit reviewed. Allergies and medications reviewed and updated.  Review of Systems  Constitutional: Negative for chills and fever.  Respiratory: Negative for shortness of breath and wheezing.   Cardiovascular: Negative for chest pain and leg swelling.  Musculoskeletal: Negative for back pain and gait problem.  Skin: Negative for rash.  All other systems reviewed and are negative.   Per HPI unless specifically indicated above   Allergies as of 04/30/2019      Reactions   Wellbutrin [bupropion] Anxiety      Medication List       Accurate as of April 30, 2019  4:01 PM. If you have any questions, ask your nurse or doctor.        acetaminophen 500 MG tablet Commonly known as: TYLENOL Take 500 mg by mouth every 6 (six) hours as needed for moderate pain.   atorvastatin 20 MG tablet Commonly known as: LIPITOR Take 1 tablet (20 mg total) by mouth daily. (Needs to be seen) What changed:   when to take this  additional instructions   bisoprolol 10 MG tablet Commonly known as: ZEBETA TAKE (1) TABLET TWICE A DAY. What changed: See the new instructions.   budesonide-formoterol 160-4.5 MCG/ACT inhaler Commonly known as: SYMBICORT Inhale 2 puffs into the lungs 2 (two) times  daily.   calcitRIOL 0.25 MCG capsule Commonly known as: ROCALTROL Take 1 capsule (0.25 mcg total) by mouth every morning.   clonazePAM 1 MG tablet Commonly known as: KLONOPIN TAKE 1/2 TABLET AT BEDTIME   cyclobenzaprine 10 MG tablet Commonly known as: FLEXERIL Take 1 tablet (10 mg total) by mouth 3 (three) times daily as needed for muscle spasms.   diltiazem 120 MG 24 hr capsule Commonly known as: Cardizem CD Take 1 capsule (120 mg total) by mouth daily.   feeding supplement (NEPRO CARB STEADY) Liqd Take 237 mLs by mouth 2 (two) times daily between meals.   ferrous sulfate 325 (65 FE) MG tablet Take 325 mg by mouth every Monday, Wednesday, and Friday.   fluticasone 50 MCG/ACT nasal spray Commonly known as: FLONASE Place 2 sprays into both nostrils daily.   hydroxypropyl methylcellulose / hypromellose 2.5 % ophthalmic solution Commonly known as: ISOPTO TEARS / GONIOVISC Place 1 drop into both eyes daily as needed for dry eyes.   iron polysaccharides 150 MG capsule Commonly known as: Ferrex 150 Take 1 capsule (150 mg total) by mouth daily. (Needs to be seen) What changed: when to take this   loratadine 10 MG tablet Commonly known as: CLARITIN TAKE 1 TABLET DAILY   Melatonin 3 MG Tabs Take 3 mg by mouth at bedtime.   multivitamin Tabs tablet Take 1 tablet by  mouth at bedtime.   multivitamin with minerals Tabs tablet Take 1 tablet by mouth at bedtime.   nicotine 14 mg/24hr patch Commonly known as: NICODERM CQ - dosed in mg/24 hours Place 1 patch (14 mg total) onto the skin daily.   omega-3 acid ethyl esters 1 g capsule Commonly known as: LOVAZA Take 2 g by mouth 2 (two) times daily.   pantoprazole 40 MG tablet Commonly known as: PROTONIX TAKE (1) TABLET TWICE A DAY.   PRESERVISION AREDS 2 PO Take 1 tablet by mouth 2 (two) times daily.   ProAir HFA 108 (90 Base) MCG/ACT inhaler Generic drug: albuterol Inhale 2 puffs into the lungs 2 (two) times daily.    terazosin 2 MG capsule Commonly known as: HYTRIN TAKE 1 CAPSULE AT BEDTIME   terbinafine 250 MG tablet Commonly known as: LAMISIL Take it 3 times weekly on dialysis day after dialysis   tiotropium 18 MCG inhalation capsule Commonly known as: SPIRIVA Place 18 mcg into inhaler and inhale daily as needed (for shortness of breath).   warfarin 2 MG tablet Commonly known as: COUMADIN Take as directed by the anticoagulation clinic. If you are unsure how to take this medication, talk to your nurse or doctor. Original instructions: Take 4mg  daily except Fridays take 3mg .        Objective:   BP 111/74   Pulse 96   Temp (!) 96.9 F (36.1 C) (Oral)   Ht 5\' 9"  (1.753 m)   Wt 159 lb 6.4 oz (72.3 kg)   BMI 23.54 kg/m   Wt Readings from Last 3 Encounters:  04/30/19 159 lb 6.4 oz (72.3 kg)  04/08/19 160 lb 6.4 oz (72.8 kg)  03/14/19 149 lb (67.6 kg)    Physical Exam Vitals signs and nursing note reviewed.  Constitutional:      General: He is not in acute distress.    Appearance: He is well-developed. He is not diaphoretic.  Eyes:     General: No scleral icterus.    Conjunctiva/sclera: Conjunctivae normal.  Neck:     Thyroid: No thyromegaly.  Skin:    General: Skin is warm and dry.     Findings: Bruising (Has a little bruising on his left arm around the fistula site) present. No rash.  Neurological:     Mental Status: He is alert and oriented to person, place, and time.     Coordination: Coordination normal.  Psychiatric:        Behavior: Behavior normal.       Assessment & Plan:   Problem List Items Addressed This Visit      Cardiovascular and Mediastinum   Atrial fibrillation with RVR ([place]) - Primary   Relevant Orders   CoaguChek XS/INR Waived      Description   Goal INR 2.0-3.0 INR today is 1.8 (At goal)  Increase current dose to 3 mg on Friday and 4 mg the rest of the week     Follow up plan: Return in about 4 weeks (around 05/28/2019), or if symptoms  worsen or fail to improve, for INR recheck.  Counseling provided for all of the vaccine components Orders Placed This Encounter  Procedures  . CoaguChek XS/INR [place], MD [facility] 04/30/2019, 4:01 PM

## 2019-05-01 ENCOUNTER — Telehealth: Payer: Self-pay | Admitting: *Deleted

## 2019-05-01 NOTE — Telephone Encounter (Signed)
Mailbox full-cb 6/18

## 2019-05-01 NOTE — Telephone Encounter (Signed)
Patient had INR and it was 1.8 yesterday in the office so no need for adjustment from this INR.

## 2019-05-01 NOTE — Telephone Encounter (Signed)
Fax received mdINR PT/INR self testing service Test date/time 04/30/19 5:45 pm INR 1.8

## 2019-05-06 ENCOUNTER — Telehealth: Payer: Self-pay | Admitting: *Deleted

## 2019-05-06 NOTE — Telephone Encounter (Signed)
[name] aware and [employer].

## 2019-05-06 NOTE — Telephone Encounter (Signed)
lmtcb

## 2019-05-06 NOTE — Telephone Encounter (Signed)
Description   Goal INR 2.0-3.0 INR today is 1.6   Take 2-1/2 tablets today and Increase current dose to 5 mg on Monday and Thursday and 4 mg the rest of the week    Caryl Pina, MD [facility] 05/06/2019, 3:54 PM

## 2019-05-06 NOTE — Telephone Encounter (Signed)
Refer to note from 6/23

## 2019-05-08 ENCOUNTER — Encounter: Payer: Self-pay | Admitting: Internal Medicine

## 2019-05-12 ENCOUNTER — Ambulatory Visit: Payer: Self-pay | Admitting: Licensed Clinical Social Worker

## 2019-05-12 ENCOUNTER — Ambulatory Visit: Payer: Self-pay | Admitting: *Deleted

## 2019-05-12 DIAGNOSIS — N186 End stage renal disease: Secondary | ICD-10-CM

## 2019-05-12 DIAGNOSIS — Z992 Dependence on renal dialysis: Secondary | ICD-10-CM

## 2019-05-12 DIAGNOSIS — R531 Weakness: Secondary | ICD-10-CM

## 2019-05-12 DIAGNOSIS — F339 Major depressive disorder, recurrent, unspecified: Secondary | ICD-10-CM

## 2019-05-12 DIAGNOSIS — R413 Other amnesia: Secondary | ICD-10-CM

## 2019-05-12 DIAGNOSIS — I10 Essential (primary) hypertension: Secondary | ICD-10-CM

## 2019-05-12 DIAGNOSIS — J441 Chronic obstructive pulmonary disease with (acute) exacerbation: Secondary | ICD-10-CM

## 2019-05-12 NOTE — Patient Instructions (Signed)
Licensed Clinical Social Worker Visit Information  Goals we discussed today:   Goals    . "I get sad sometimes because I live alone" (pt-stated)     Current Barriers:   Client has transportation needs . Client has some ambulation challenges (uses cane to help with ambulation) . Social isolation issues  Clinical Social Work Clinical Goal(s): Over the next 30 days, client will verbalize understanding of depression symptoms management.  Interventions:  . Previously encouraged client to participate in recreational/relaxation activities of choice  . Previously encouraged client or POA to call LCSW as needed to discuss management of depression symptoms of client . Previously encouraged client to use relaxation techniques (watching TV,talking via phone with Don [name] ([place]) . Encouraged client to attend scheduled client medical appointments . Talked with Don [name] POA for client about fistula concerns for client (as discussed by POA)  Patient Self Care Activities:  . Client socializes with family weekly as scheduled.  . Client contacts Don [name], step-son, POA, as needed for daily needs/assistance of client. . Client takes medications as prescribed.  Plan:  . LCSW to contact client/POA Don [name]) in three weeks to discuss client needs related to depression management.  Don [name] [facility]) to contact LCSW related to psychosocial needs of client . [name] Client to use relaxation techniques of choice to help him manage depression symptoms.  . Client to attend scheduled medical appointments . Client or POA Don [name] to communicate with RN CM as needed to discuss nursing needs of client  .  LCSW collaborated with [employer] regarding fistula concerns for client , as discussed with POA , Don [name] on 05/12/2019,     Materials Provided: No  Follow Up Plan: LCSW to contact client/POA in next 3 weeks to discuss client needs related to  depression management   The  patient/POA Don [name] verbalized understanding of instructions provided today and declined a print copy of patient instruction materials.   Don [name].Don [name] MSW, LCSW Licensed Clinical Social Worker [facility] Care Management [phone]

## 2019-05-12 NOTE — Chronic Care Management (AMB) (Signed)
Chronic Care Management    Clinical Social Work CCM Outreach Note  05/12/2019 Name: Don [name] MRN: [id number] DOB: Jun 15, 1941  Don [name] is a 78 y.o. year old male who is a primary care patient of Dettinger, Fransisca Kaufmann, MD . The CCM team was consulted for assistance with assessment of psychosocial needs.   LCSW reached out to [place]  today by phone   Social Determinants of Health:Risk of Social Isolation; Risk tobacco exposure; Risk for physical inactivity    Chronic Care Management from 12/23/2018 in [place]  PHQ-9 Total Score  9     GAD 7 : Generalized Anxiety Score 05/25/2016  Nervous, Anxious, on Edge 1  Control/stop worrying 2  Worry too much - different things 2  Trouble relaxing 1  Restless 0  Easily annoyed or irritable 1  Afraid - awful might happen 0  Total GAD 7 Score 7  Anxiety Difficulty Not difficult at all   Goals    . "I get sad sometimes because I live alone" (pt-stated)     Current Barriers:   Client has transportation needs . Client has some ambulation challenges (uses cane to help with ambulation) . Social isolation issues  Clinical Social Work Clinical Goal(s): Over the next 30 days, client will verbalize understanding of depression symptoms management.  Interventions: . Previously discussed self care activities: sleep hygiene, basic healthy eating practices . Previously encouraged client to participate in recreational/relaxation activities of choice  . Encouraged client or POA to call LCSW as needed to discuss management of depression symptoms of client . Previously encouraged client to use relaxation techniques (watching TV,talking via phone with [name] ([place]) . Encouraged client to attend scheduled client medical appointments . Talked with POA [name] about fistula needs of client (at request of [name] [facility])   Patient Self Care Activities:  . Client socializes with family  weekly as scheduled.  . Client contacts [name], step-son, POA, as needed for daily needs/assistance of client. . Client takes medications as prescribed.  Plan:  . LCSW to contact client/POA [name]) in three weeks to discuss client needs related to depression management.  [name] [facility]) to contact LCSW related to psychosocial needs of client . [name] Client to use relaxation techniques of choice to help him manage depression symptoms.  . Client to attend scheduled medical appointments  .  Client or POA [name] to communicate with RN CM as needed to discuss nursing needs of client   . LCSW to collaborate with RNCM [name] regarding fistula issues for client (as discussed with POA)       Client participates in dialysis treatments weekly as scheduled. Client has support from POA, step son, [name]. LCSW has talked several times with client about client uses of relaxation techniques to help client manage depression symptoms. LCSW has encouraged client to socialize with family or friends to help with stress management of client. Client is still at risk for occasional social isolation. [name] POA reported today to LCSW that client was having an issue with client's fistula.  [name] said he had tried to get clarification with [facility] about fistula needs of client but was unsure of how to describe current client needs pertaining to fistula. LCSW informed [name] that LCSW would contact [place] today and inform RNCM of this issue to see if RNCM could call [facility] to seek clarification of fistula issue for client. [name]  agreed to this plan.  Client is scheduled to receive a dialysis treatment at [facility] tomorrow.  Follow Up Plan:LCSW to contact  client/POA [name] in next 3 weeks to discuss client needs related to Depression management     [name].Mariann Palo MSW, LCSW Licensed Clinical Social Worker [facility]  Care Management [phone]

## 2019-05-13 ENCOUNTER — Telehealth: Payer: Self-pay | Admitting: *Deleted

## 2019-05-13 DIAGNOSIS — Z7901 Long term (current) use of anticoagulants: Secondary | ICD-10-CM | POA: Diagnosis not present

## 2019-05-13 DIAGNOSIS — I480 Paroxysmal atrial fibrillation: Secondary | ICD-10-CM | POA: Diagnosis not present

## 2019-05-13 NOTE — Telephone Encounter (Signed)
Son Don [name]) aware

## 2019-05-13 NOTE — Telephone Encounter (Signed)
Goal INR 2.0-3.0 INR today is 2.5 perfect!!  Continue  current dose to 5 mg on Monday and Thursday and 4 mg the rest of the week

## 2019-05-13 NOTE — Telephone Encounter (Signed)
[facility] fax - checked today 05/13/19 at 959 am  Result - 2.5 Range for pt is 2-3  Please address if changes need to be made.  (coverage for Dr Warrick Parisian)

## 2019-05-13 NOTE — Chronic Care Management (AMB) (Addendum)
  Chronic Care Management    Note   05/12/2019 Name: Don [name] MRN: [id number] DOB: 10-07-1941  Referred by: Dettinger, Fransisca Kaufmann, MD Reason for referral : Chronic Care Management   Don [name] is a 78 y.o. year old male who is a primary care patient of Dettinger, Fransisca Kaufmann, MD. The CCM team was consulted for assistance with chronic disease management and care coordination needs.    Review of patient status, including review of consultants reports, relevant laboratory and other test results, and collaboration with appropriate care team members and the patient's provider was performed as part of comprehensive patient evaluation and provision of chronic care management services.    It was relayed to me by Theadore Nan, LCSW that the patient/caregiver are concerned that he has not been scheduled for an appointment with Dr Scot Dock to evaluate his [name]. He typically uses this site for dialysis three times a week but has been using a catheter recently because there is a "problem".  Goals Addressed      Patient Stated   . POA stated: "Don [name]" (pt-stated)       Current Barriers:  . Cognitive Deficits . Chronic Disease Management support and education needs related to [name] management.  Nurse Case Manager Clinical Goal(s):  [name] Over the next 3 days, patient will verbalize understanding of plan for scheduling an appointment with Dr Scot Dock  [phone]  Interventions:  . Collaborated with Theadore Nan, LCSW regarding patient and caregiver's concerns . Spoke with [facility] of [facility] [phone])  o Confirmed that they send referral request to Dr Nicole Cella office o Was told that Don [name] calls frequently to check on the status of the appointment.  o They do not have an appointment date for him yet but also they do not have control over Dr Nicole Cella schedule . Spoke with Dr Nicole Cella  office. o Verified that receipt of referral o I was told that the nurse has reviewed it and is ready to schedule Don [name] Verified that they will notify caregiver when appointment is scheduled . Will relay information to patient/caregiver  Patient Self Care Activities:  . Calls provider office for new concerns or questions . Has significant assistance from stepson and POA, [name]  Initial goal documentation        Follow Up Plan The care management team will reach out to the patient again over the next 30 days.    [name], RN-BC, BSN Nurse Care Manager [facility] [phone]

## 2019-05-13 NOTE — Patient Instructions (Signed)
Visit Information  Goals Addressed            This Visit's Progress     Patient Stated   . POA stated: "[name] needs to get an appointment with Dr Scot Dock because something is wrong with his fistula" (pt-stated)       Current Barriers:  . Cognitive Deficits . Chronic Disease Management support and education needs related to fistula management.  Nurse Case Manager Clinical Goal(s):  Don Kitchen Over the next 3 days, patient will verbalize understanding of plan for scheduling an appointment with Dr Scot Dock  [phone]  Interventions:  . Collaborated with Theadore Nan, LCSW regarding patient and caregiver's concerns . Spoke with [facility] of [facility] [phone])  o Confirmed that they send referral request to Dr Nicole Cella office o Was told that [name] calls frequently to check on the status of the appointment.  o They do not have an appointment date for him yet but also they do not have control over Dr Nicole Cella schedule . Spoke with Dr Nicole Cella office. o Verified that receipt of referral o I was told that the nurse has reviewed it and is ready to schedule [name] Verified that they will notify caregiver when appointment is scheduled . Relayed information to patient/caregiver  Patient Self Care Activities:  . Calls provider office for new concerns or questions . Has significant assistance from stepson and POA, Don [name]  Initial goal documentation        The patient verbalized understanding of instructions provided today and declined a print copy of patient instruction materials.   The care management team will reach out to the patient again over the next 30 days.    [name], RN-BC, BSN Nurse Care Manager [facility] [phone]

## 2019-05-14 ENCOUNTER — Ambulatory Visit: Payer: Self-pay | Admitting: Licensed Clinical Social Worker

## 2019-05-14 DIAGNOSIS — R413 Other amnesia: Secondary | ICD-10-CM

## 2019-05-14 DIAGNOSIS — R296 Repeated falls: Secondary | ICD-10-CM

## 2019-05-14 DIAGNOSIS — I10 Essential (primary) hypertension: Secondary | ICD-10-CM

## 2019-05-14 DIAGNOSIS — N186 End stage renal disease: Secondary | ICD-10-CM

## 2019-05-14 DIAGNOSIS — F339 Major depressive disorder, recurrent, unspecified: Secondary | ICD-10-CM

## 2019-05-14 DIAGNOSIS — R531 Weakness: Secondary | ICD-10-CM

## 2019-05-14 DIAGNOSIS — J441 Chronic obstructive pulmonary disease with (acute) exacerbation: Secondary | ICD-10-CM

## 2019-05-14 NOTE — Patient Instructions (Addendum)
Licensed Clinical Social Worker Visit Information  I reached out to [place] by phone today. Cameron had reported that he thought something was not quite right with fistula. [name], POA has talked with LCSW about his concerns about fistula for client. LCSW shared [name] and Dontez's concerns with RNCM Kristen Hudy several days ago. [name] contacted [facility] working with client to discuss these concerns. Cyril Mourning also called [facility] in [place], [place] to discuss concerns of client and [name].  LCSW called [facility] in [place], [place] today and spoke with Davy Pique about concerns of client and [name], [place] for client. Davy Pique reported that Clinic had received referral on [name]. She said she would contact [place] to talk with [name] about client needs related to fistula care .  [name] POA has been seeking client appointment with that Clinic related to fistula needs of client. Sonya told LCSW that she would call [name] to discuss fistula needs of client. LCSW thanked Davy Pique at [facility] for addressing this issue of concern for client and [name].    Materials Provided: No  Follow Up Plan: LCSW to call client/[name] as scheduled to assess client needs at that time.  The patient Don [name] verbalized understanding of instructions provided today and declined a print copy of patient instruction materials.   [name].Delbra Zellars MSW, LCSW Licensed Clinical Social Worker [facility] [phone]

## 2019-05-14 NOTE — Chronic Care Management (AMB) (Signed)
  Care Management Note   Don [name] is a 78 y.o. year old male who is a primary care patient of Dettinger, Fransisca Kaufmann, MD. The CM team was consulted for assistance with chronic disease management and care coordination.   I reached out to [place] by phone today. Delmas had reported that he thought something was not quite right with fistula. Don [name], POA has talked with LCSW about his concerns about fistula for client. LCSW shared Don [name] and Don [name]'s concerns with RNCM Don [name] several days ago. Don [name] contacted [facility] working with client to discuss these concerns. Don [name] also called [facility] in [place], [place] to discuss concerns of client and Don [name].  LCSW called [facility] in [place], [place] today and spoke with Don [name] about concerns of client and Don [name], [place] for client. Don [name] reported that Clinic had received referral on Don [name]. She said she would contact [place] to talk with Don [name] about client needs related to fistula care .  Don [name] POA has been seeking client appointment with that Clinic related to fistula needs of client. Don [name] told LCSW that she would call Don [name] to discuss fistula needs of client. LCSW thanked Don [name] at [facility] for addressing this issue of concern for client and Don [name].   Follow Up Plan: LCSW to call client/POA Don [name] as scheduled to assess client needs at that time.  Don [name].Don [name] MSW, LCSW Licensed Clinical Social Worker [facility] [phone]

## 2019-05-20 ENCOUNTER — Telehealth: Payer: Self-pay | Admitting: *Deleted

## 2019-05-20 NOTE — Telephone Encounter (Signed)
Goal INR 2.0-3.0 INR today is 2.3 perfect!!  Continue  current dose to 5 mg on Monday and Thursday and 4 mg the rest of the week  Recheck in 4 weeks

## 2019-05-20 NOTE — Telephone Encounter (Signed)
Fax received mdINR PT/INR self testing service Test date/time 05/20/19 10:05 am INR 2.3

## 2019-05-20 NOTE — Telephone Encounter (Signed)
[name] aware and [place] aware.

## 2019-05-22 ENCOUNTER — Telehealth: Payer: Medicare HMO

## 2019-05-22 ENCOUNTER — Telehealth (HOSPITAL_COMMUNITY): Payer: Self-pay

## 2019-05-22 ENCOUNTER — Other Ambulatory Visit: Payer: Self-pay

## 2019-05-22 DIAGNOSIS — Z992 Dependence on renal dialysis: Secondary | ICD-10-CM

## 2019-05-22 DIAGNOSIS — N186 End stage renal disease: Secondary | ICD-10-CM

## 2019-05-22 NOTE — Telephone Encounter (Signed)
The above patient or their representative was contacted and gave the following answers to these questions:         Do you have any of the following symptoms?  no  Fever                    Cough                   Shortness of breath  Do  you have any of the following other symptoms? no   muscle pain         vomiting,        diarrhea        rash         weakness        red eye        abdominal pain         bruising          bruising or bleeding              joint pain           severe headache    Have you been in contact with someone who was or has been sick in the past 2 weeks?  no  Yes                 Unsure                         Unable to assess   Does the person that you were in contact with have any of the following symptoms?   Cough         shortness of breath           muscle pain         vomiting,            diarrhea            rash            weakness           fever            red eye           abdominal pain           bruising  or  bleeding                joint pain                severe headache               Have you  or someone you have been in contact with traveled internationally in th last month?   no      If yes, which countries?   Have you  or someone you have been in contact with traveled outside [place] in th last month?   no      If yes, which state and city?   COMMENTS OR ACTION PLAN FOR THIS PATIENT:

## 2019-05-23 ENCOUNTER — Encounter: Payer: Self-pay | Admitting: *Deleted

## 2019-05-23 ENCOUNTER — Other Ambulatory Visit: Payer: Self-pay | Admitting: *Deleted

## 2019-05-23 ENCOUNTER — Other Ambulatory Visit: Payer: Self-pay

## 2019-05-23 ENCOUNTER — Ambulatory Visit (INDEPENDENT_AMBULATORY_CARE_PROVIDER_SITE_OTHER): Payer: No Typology Code available for payment source | Admitting: Family

## 2019-05-23 ENCOUNTER — Encounter: Payer: Self-pay | Admitting: Family

## 2019-05-23 ENCOUNTER — Ambulatory Visit (HOSPITAL_COMMUNITY)
Admission: RE | Admit: 2019-05-23 | Discharge: 2019-05-23 | Disposition: A | Discharge status: Home / Self Care | Payer: No Typology Code available for payment source | Source: Ambulatory Visit | Attending: Family | Admitting: Family

## 2019-05-23 VITALS — BP 103/64 | HR 92 | Temp 97.2°F | Resp 14 | Ht 69.5 in | Wt 153.0 lb

## 2019-05-23 DIAGNOSIS — Z992 Dependence on renal dialysis: Secondary | ICD-10-CM | POA: Insufficient documentation

## 2019-05-23 DIAGNOSIS — Z7901 Long term (current) use of anticoagulants: Secondary | ICD-10-CM | POA: Diagnosis not present

## 2019-05-23 DIAGNOSIS — T82590A Other mechanical complication of surgically created arteriovenous fistula, initial encounter: Secondary | ICD-10-CM | POA: Diagnosis not present

## 2019-05-23 DIAGNOSIS — N186 End stage renal disease: Secondary | ICD-10-CM

## 2019-05-23 NOTE — Progress Notes (Signed)
CC: malfunction of left upper arm AVF  History of Present Illness  Don [name] is a 78 y.o. (1941/06/17) male who is s/p second stage left basilic vein transposition on 12/16/2018 by Dr. Scot Dock.    He has also had venoplasty of 3 stenoses within the fistula on 03-14-19 by Dr. Scot Dock.   He returns today at the request of Dr. Lowanda Foster for evaluation of left upper arm AVF; pt describes what sounds like infiltration from accessing; pt states he was told there was too much pressure in the AVF. It is unclear from pt how long this has been problematic.    He dialyzes on Tuesdays Thursdays and Saturdays.    He reports numbness at the medial aspect of his left forearm "for quit a while".  He does not seem to have left hand problems.   He smoked for 62 years, quit in December 2019.   The patient is bilateal hand dominant.     Past Medical History:  Diagnosis Date  . Acute bronchitis 04/03/2014  . Anemia    low iron  . Anxiety   . Atrial fibrillation ([place])   . Cataract   . COPD (chronic obstructive pulmonary disease) ([place])   . Depression   . Essential hypertension   . Hyperlipidemia   . Macular degeneration   . Noncompliance with medications 04/2013   Xarelto, digoxin previously  . Pre-diabetes   . Prostate cancer ([place])   . 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 Tubular adenoma of colon 07/31/02, 11/18/03    Social History Social History   Tobacco Use  . Smoking status: Former Smoker    Packs/day: 0.50    Years: 62.00    Pack years: 31.00    Types: Cigarettes, Cigars    Quit date: 10/27/2018    Years since quitting: 0.5  . Smokeless tobacco: Never Used  . Tobacco comment: "very little"  Substance Use Topics  . Alcohol use: No  . Drug use: No    Family History Family History  Problem Relation Age of Onset  . Diabetes Father   . Heart disease Father 71       MI  . Heart attack Father   . Heart attack Mother   . Heart  disease Brother   . Cancer Brother        lung  . Lung cancer Brother   . Heart disease Brother   . Lung cancer Sister   . Cancer Sister        lung  . Heart disease Brother   . Heart disease Brother     Surgical History Past Surgical History:  Procedure Laterality Date  . Anal abcess,Hemorroids,    . BASCILIC VEIN TRANSPOSITION Left 10/08/2018   Procedure: FIRST STAGE BASILIC VEIN TRANSPOSITION LEFT ARM;  Surgeon: Angelia Mould, MD;  Location: [facility];  Service: Vascular;  Laterality: Left;  . BASCILIC VEIN TRANSPOSITION Left 12/16/2018   Procedure: BASILIC VEIN TRANSPOSITION SECOND STAGE;  Surgeon: Angelia Mould, MD;  Location: [facility] OR;  Service: Vascular;  Laterality: Left;  . COLONOSCOPY N/A 04/05/2014   Dr. Fuller Plan: 6 mm sessile polyp from sigmoid colon (tubular adenoma), internal hemorrhoids  . COLONOSCOPY W/ BIOPSIES  11/18/2003   Dr. Earle Gell  . ESOPHAGOGASTRODUODENOSCOPY  11/18/2003   Dr. Earle Gell  . ESOPHAGOGASTRODUODENOSCOPY N/A 04/05/2014   Dr. Fuller Plan: variable Z line, negative Barrett's, small hiatal hernia  . ESOPHAGOGASTRODUODENOSCOPY N/A 02/02/2017  Dr. Oneida Alar: LA Grade B esophagitis, one moderate benign-appearing intrinsic stenosis traversed, small non-bleeding diverticulum in second portion of duodenum, reactive gastropathy, no H.pylori.  . ESOPHAGOGASTRODUODENOSCOPY N/A 03/22/2018   Procedure: ESOPHAGOGASTRODUODENOSCOPY (EGD);  Surgeon: Daneil Dolin, MD;  [facility];  Service: Endoscopy;  Laterality: N/A;  . GIVENS CAPSULE STUDY N/A 03/22/2018   Procedure: GIVENS CAPSULE STUDY;  Surgeon: Daneil Dolin, MD;  [facility];  Service: Endoscopy;  Laterality: N/A;  . IR FLUORO GUIDE CV LINE RIGHT  10/29/2018  . IR US GUIDE VASC ACCESS RIGHT  10/29/2018  . PERIPHERAL VASCULAR BALLOON ANGIOPLASTY Left 03/14/2019   Procedure: PERIPHERAL VASCULAR BALLOON ANGIOPLASTY;  Surgeon: Angelia Mould, MD;  Location: [facility]  CV [facility];  Service: Cardiovascular;  Laterality: Left;  . RETROPUBIC PROSTATECTOMY  11/26/2001  . TONSILLECTOMY      Allergies  Allergen Reactions  . Wellbutrin [Bupropion] Anxiety    Current Outpatient Medications  Medication Sig Dispense Refill  . acetaminophen (TYLENOL) 500 MG tablet Take 500 mg by mouth every 6 (six) hours as needed for moderate pain.     [name] albuterol (PROAIR HFA) 108 (90 Base) MCG/ACT inhaler Inhale 2 puffs into the lungs 2 (two) times daily.     [name] atorvastatin (LIPITOR) 20 MG tablet Take 1 tablet (20 mg total) by mouth daily. (Needs to be seen) (Patient taking differently: Take 20 mg by mouth at bedtime. ) 90 tablet 3  . bisoprolol (ZEBETA) 10 MG tablet TAKE (1) TABLET TWICE A DAY. (Patient taking differently: Take 10 mg by mouth every morning. ) 180 tablet 1  . budesonide-formoterol (SYMBICORT) 160-4.5 MCG/ACT inhaler Inhale 2 puffs into the lungs 2 (two) times daily.    . calcitRIOL (ROCALTROL) 0.25 MCG capsule Take 1 capsule (0.25 mcg total) by mouth every morning. 90 capsule 3  . clonazePAM (KLONOPIN) 1 MG tablet TAKE 1/2 TABLET AT BEDTIME 15 tablet 1  . cyclobenzaprine (FLEXERIL) 10 MG tablet Take 1 tablet (10 mg total) by mouth 3 (three) times daily as needed for muscle spasms. 30 tablet 0  . diltiazem (CARDIZEM CD) 120 MG 24 hr capsule Take 1 capsule (120 mg total) by mouth daily. 90 capsule 3  . ferrous sulfate 325 (65 FE) MG tablet Take 325 mg by mouth every Monday, Wednesday, and Friday.    . fluticasone (FLONASE) 50 MCG/ACT nasal spray Place 2 sprays into both nostrils daily. 16 g 6  . hydroxypropyl methylcellulose / hypromellose (ISOPTO TEARS / GONIOVISC) 2.5 % ophthalmic solution Place 1 drop into both eyes daily as needed for dry eyes.    . iron polysaccharides (FERREX 150) 150 MG capsule Take 1 capsule (150 mg total) by mouth daily. (Needs to be seen) (Patient taking differently: Take 150 mg by mouth every morning. (Needs to be seen)) 90 capsule 3  .  loratadine (CLARITIN) 10 MG tablet TAKE 1 TABLET DAILY 30 tablet 5  . Melatonin 3 MG TABS Take 3 mg by mouth at bedtime.     . Multiple Vitamin (MULTIVITAMIN WITH MINERALS) TABS tablet Take 1 tablet by mouth at bedtime.     . Multiple Vitamins-Minerals (PRESERVISION AREDS 2 PO) Take 1 tablet by mouth 2 (two) times daily.     . multivitamin (RENA-VIT) TABS tablet Take 1 tablet by mouth at bedtime. 30 tablet 0  . omega-3 acid ethyl esters (LOVAZA) 1 g capsule Take 2 g by mouth 2 (two) times daily.    . pantoprazole (PROTONIX) 40 MG tablet  TAKE (1) TABLET TWICE A DAY. 180 tablet 0  . terazosin (HYTRIN) 2 MG capsule TAKE 1 CAPSULE AT BEDTIME (Patient taking differently: Take 2 mg by mouth at bedtime. ) 90 capsule 0  . terbinafine (LAMISIL) 250 MG tablet Take it 3 times weekly on dialysis day after dialysis 36 tablet 1  . tiotropium (SPIRIVA) 18 MCG inhalation capsule Place 18 mcg into inhaler and inhale daily as needed (for shortness of breath).     . warfarin (COUMADIN) 2 MG tablet Take 4mg  daily except Fridays take 3mg . 30 tablet 3  . nicotine (NICODERM CQ - DOSED IN MG/24 HOURS) 14 mg/24hr patch Place 1 patch (14 mg total) onto the skin daily. (Patient not taking: Reported on 05/23/2019) 28 patch 2  . Nutritional Supplements (FEEDING SUPPLEMENT, NEPRO CARB STEADY,) LIQD Take 237 mLs by mouth 2 (two) times daily between meals. (Patient not taking: Reported on 05/23/2019) 60 Can 0   No current facility-administered medications for this visit.      REVIEW OF SYSTEMS: see HPI for pertinent positives and negatives    PHYSICAL EXAMINATION:  Vitals:   05/23/19 0906  BP: 103/64  Pulse: 92  Resp: 14  Temp: (!) 97.2 F (36.2 C)  TempSrc: Temporal  SpO2: 98%  Weight: 153 lb (69.4 kg)  Height: 5' 9.5" (1.765 m)   Body mass index is 22.27 kg/m.  General: The patient appears his stated age.   HEENT:  No gross abnormalities Pulmonary: Respirations are non-labored Abdomen: Soft and non-tender   Musculoskeletal: There are no major deformities.   Neurologic: No focal weakness or paresthesias are detected Skin: Multiple papular pink lesions on his arms Psychiatric: The patient has normal affect. Cardiovascular: There is an irregular rhythm with controlled rate.  Left upper arm AVF with palpable pulse, thrill, no audible bruit.  Bilateral radial pulses are 2+ palpable.    Non-Invasive Vascular Imaging  Left Upper Arm AVF Duplex  (Date: 05/23/2019):  Findings: +--------------------+----------+-----------------+--------+ AVF                 PSV (cm/s)Flow Vol (mL/min)Comments +--------------------+----------+-----------------+--------+ Native artery inflow    70           701                +--------------------+----------+-----------------+--------+ AVF Anastomosis         93                              +--------------------+----------+-----------------+--------+   +------------+----------+-------------+----------+-----------------------------+ OUTFLOW VEINPSV (cm/s)Diameter (cm)Depth (cm)          Describe            +------------+----------+-------------+----------+-----------------------------+ Prox UA        175        0.41        1.10                                 +------------+----------+-------------+----------+-----------------------------+ Mid UA         417        0.30        0.11   stenotic and competing branch +------------+----------+-------------+----------+-----------------------------+ Dist UA        122        0.62        0.15                                 +------------+----------+-------------+----------+-----------------------------+  AC Fossa       448        0.27        0.34             stenotic            +------------+----------+-------------+----------+-----------------------------+  Summary: Arteriovenous fistula-Elevated velocities and a velocity ratio of greater than 3 noted in the mid upper arm.  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  Post-stenotic turbulence noted.   Medical Decision Making  WAYMON LASER is a 78 y.o. male who is s/p second stage left basilic vein transposition on 12/16/2018.  Pt states he had non radiating chest pain and back pain last night, resolved with heating pad to his back. I advised his daughter Hilda Blades (by phone) to call pt's PCP today and let him or her know this.   He takes warfarin, has a history of atrial fib.   Left upper arm AVF duplex today shows stenosis in the antecubital fossa and mid upper arm. Post-stenotic turbulence noted. Dr. Donzetta Matters spoke with pt (and his daughter Hilda Blades by phone), and examined pt.  Will schedule pt for fistula-gram ASAP, soonest available VVS surgeon, possible intervention.    Clemon Chambers, RN, MSN, FNP-C [facility] Office: [phone]  05/23/2019, 9:20 AM  Clinic MD: Donzetta Matters

## 2019-05-23 NOTE — H&P (View-Only) (Signed)
CC: malfunction of left upper arm AVF  History of Present Illness  Don [name] is a 78 y.o. ([birth year]) male who is s/p second stage left basilic vein transposition on 12/16/2018 by Dr. Scot Dock.    He has also had venoplasty of 3 stenoses within the fistula on 03-14-19 by Dr. Scot Dock.   He returns today at the request of Dr. Lowanda Foster for evaluation of left upper arm AVF; pt describes what sounds like infiltration from accessing; pt states he was told there was too much pressure in the AVF. It is unclear from pt how long this has been problematic.    He dialyzes on Tuesdays Thursdays and Saturdays.    He reports numbness at the medial aspect of his left forearm "for quit a while".  He does not seem to have left hand problems.   He smoked for 62 years, quit in December 2019.   The patient is bilateal hand dominant.     Past Medical History:  Diagnosis Date  . Acute bronchitis 04/03/2014  . Anemia    low iron  . Anxiety   . Atrial fibrillation ([place])   . Cataract   . COPD (chronic obstructive pulmonary disease) ([place])   . Depression   . Essential hypertension   . Hyperlipidemia   . Macular degeneration   . Noncompliance with medications 04/2013   Xarelto, digoxin previously  . Pre-diabetes   . Prostate cancer ([place])   . 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 Tubular adenoma of colon 07/31/02, 11/18/03    Social History Social History   Tobacco Use  . Smoking status: Former Smoker    Packs/day: 0.50    Years: 62.00    Pack years: 31.00    Types: Cigarettes, Cigars    Quit date: 10/27/2018    Years since quitting: 0.5  . Smokeless tobacco: Never Used  . Tobacco comment: "very little"  Substance Use Topics  . Alcohol use: No  . Drug use: No    Family History Family History  Problem Relation Age of Onset  . Diabetes Father   . Heart disease Father 8       MI  . Heart attack Father   . Heart attack Mother   . Heart  disease Brother   . Cancer Brother        lung  . Lung cancer Brother   . Heart disease Brother   . Lung cancer Sister   . Cancer Sister        lung  . Heart disease Brother   . Heart disease Brother     Surgical History Past Surgical History:  Procedure Laterality Date  . Anal abcess,Hemorroids,    . BASCILIC VEIN TRANSPOSITION Left 10/08/2018   Procedure: FIRST STAGE BASILIC VEIN TRANSPOSITION LEFT ARM;  Surgeon: Angelia Mould, MD;  Location: [facility];  Service: Vascular;  Laterality: Left;  . BASCILIC VEIN TRANSPOSITION Left 12/16/2018   Procedure: BASILIC VEIN TRANSPOSITION SECOND STAGE;  Surgeon: Angelia Mould, MD;  Location: [facility] OR;  Service: Vascular;  Laterality: Left;  . COLONOSCOPY N/A 04/05/2014   Dr. Fuller Plan: 6 mm sessile polyp from sigmoid colon (tubular adenoma), internal hemorrhoids  . COLONOSCOPY W/ BIOPSIES  11/18/2003   Dr. Earle Gell  . ESOPHAGOGASTRODUODENOSCOPY  11/18/2003   Dr. Earle Gell  . ESOPHAGOGASTRODUODENOSCOPY N/A 04/05/2014   Dr. Fuller Plan: variable Z line, negative Barrett's, small hiatal hernia  . ESOPHAGOGASTRODUODENOSCOPY N/A 02/02/2017  Dr. Oneida Alar: LA Grade B esophagitis, one moderate benign-appearing intrinsic stenosis traversed, small non-bleeding diverticulum in second portion of duodenum, reactive gastropathy, no H.pylori.  . ESOPHAGOGASTRODUODENOSCOPY N/A 03/22/2018   Procedure: ESOPHAGOGASTRODUODENOSCOPY (EGD);  Surgeon: Daneil Dolin, MD;  [facility];  Service: Endoscopy;  Laterality: N/A;  . GIVENS CAPSULE STUDY N/A 03/22/2018   Procedure: GIVENS CAPSULE STUDY;  Surgeon: Daneil Dolin, MD;  [facility];  Service: Endoscopy;  Laterality: N/A;  . IR FLUORO GUIDE CV LINE RIGHT  10/29/2018  . IR US GUIDE VASC ACCESS RIGHT  10/29/2018  . PERIPHERAL VASCULAR BALLOON ANGIOPLASTY Left 03/14/2019   Procedure: PERIPHERAL VASCULAR BALLOON ANGIOPLASTY;  Surgeon: Angelia Mould, MD;  Location: [facility]  CV LAB;  Service: Cardiovascular;  Laterality: Left;  . RETROPUBIC PROSTATECTOMY  11/26/2001  . TONSILLECTOMY      Allergies  Allergen Reactions  . Wellbutrin [Bupropion] Anxiety    Current Outpatient Medications  Medication Sig Dispense Refill  . acetaminophen (TYLENOL) 500 MG tablet Take 500 mg by mouth every 6 (six) hours as needed for moderate pain.     [name] albuterol (PROAIR HFA) 108 (90 Base) MCG/ACT inhaler Inhale 2 puffs into the lungs 2 (two) times daily.     [name] atorvastatin (LIPITOR) 20 MG tablet Take 1 tablet (20 mg total) by mouth daily. (Needs to be seen) (Patient taking differently: Take 20 mg by mouth at bedtime. ) 90 tablet 3  . bisoprolol (ZEBETA) 10 MG tablet TAKE (1) TABLET TWICE A DAY. (Patient taking differently: Take 10 mg by mouth every morning. ) 180 tablet 1  . budesonide-formoterol (SYMBICORT) 160-4.5 MCG/ACT inhaler Inhale 2 puffs into the lungs 2 (two) times daily.    . calcitRIOL (ROCALTROL) 0.25 MCG capsule Take 1 capsule (0.25 mcg total) by mouth every morning. 90 capsule 3  . clonazePAM (KLONOPIN) 1 MG tablet TAKE 1/2 TABLET AT BEDTIME 15 tablet 1  . cyclobenzaprine (FLEXERIL) 10 MG tablet Take 1 tablet (10 mg total) by mouth 3 (three) times daily as needed for muscle spasms. 30 tablet 0  . diltiazem (CARDIZEM CD) 120 MG 24 hr capsule Take 1 capsule (120 mg total) by mouth daily. 90 capsule 3  . ferrous sulfate 325 (65 FE) MG tablet Take 325 mg by mouth every Monday, Wednesday, and Friday.    . fluticasone (FLONASE) 50 MCG/ACT nasal spray Place 2 sprays into both nostrils daily. 16 g 6  . hydroxypropyl methylcellulose / hypromellose (ISOPTO TEARS / GONIOVISC) 2.5 % ophthalmic solution Place 1 drop into both eyes daily as needed for dry eyes.    . iron polysaccharides (FERREX 150) 150 MG capsule Take 1 capsule (150 mg total) by mouth daily. (Needs to be seen) (Patient taking differently: Take 150 mg by mouth every morning. (Needs to be seen)) 90 capsule 3  .  loratadine (CLARITIN) 10 MG tablet TAKE 1 TABLET DAILY 30 tablet 5  . Melatonin 3 MG TABS Take 3 mg by mouth at bedtime.     . Multiple Vitamin (MULTIVITAMIN WITH MINERALS) TABS tablet Take 1 tablet by mouth at bedtime.     . Multiple Vitamins-Minerals (PRESERVISION AREDS 2 PO) Take 1 tablet by mouth 2 (two) times daily.     . multivitamin (RENA-VIT) TABS tablet Take 1 tablet by mouth at bedtime. 30 tablet 0  . omega-3 acid ethyl esters (LOVAZA) 1 g capsule Take 2 g by mouth 2 (two) times daily.    . pantoprazole (PROTONIX) 40 MG tablet  TAKE (1) TABLET TWICE A DAY. 180 tablet 0  . terazosin (HYTRIN) 2 MG capsule TAKE 1 CAPSULE AT BEDTIME (Patient taking differently: Take 2 mg by mouth at bedtime. ) 90 capsule 0  . terbinafine (LAMISIL) 250 MG tablet Take it 3 times weekly on dialysis day after dialysis 36 tablet 1  . tiotropium (SPIRIVA) 18 MCG inhalation capsule Place 18 mcg into inhaler and inhale daily as needed (for shortness of breath).     . warfarin (COUMADIN) 2 MG tablet Take 4mg  daily except Fridays take 3mg . 30 tablet 3  . nicotine (NICODERM CQ - DOSED IN MG/24 HOURS) 14 mg/24hr patch Place 1 patch (14 mg total) onto the skin daily. (Patient not taking: Reported on 05/23/2019) 28 patch 2  . Nutritional Supplements (FEEDING SUPPLEMENT, NEPRO CARB STEADY,) LIQD Take 237 mLs by mouth 2 (two) times daily between meals. (Patient not taking: Reported on 05/23/2019) 60 Can 0   No current facility-administered medications for this visit.      REVIEW OF SYSTEMS: see HPI for pertinent positives and negatives    PHYSICAL EXAMINATION:  Vitals:   05/23/19 0906  BP: 103/64  Pulse: 92  Resp: 14  Temp: (!) 97.2 F (36.2 C)  TempSrc: Temporal  SpO2: 98%  Weight: 153 lb (69.4 kg)  Height: 5' 9.5" (1.765 m)   Body mass index is 22.27 kg/m.  General: The patient appears his stated age.   HEENT:  No gross abnormalities Pulmonary: Respirations are non-labored Abdomen: Soft and non-tender   Musculoskeletal: There are no major deformities.   Neurologic: No focal weakness or paresthesias are detected Skin: Multiple papular pink lesions on his arms Psychiatric: The patient has normal affect. Cardiovascular: There is an irregular rhythm with controlled rate.  Left upper arm AVF with palpable pulse, thrill, no audible bruit.  Bilateral radial pulses are 2+ palpable.    Non-Invasive Vascular Imaging  Left Upper Arm AVF Duplex  (Date: 05/23/2019):  Findings: +--------------------+----------+-----------------+--------+ AVF                 PSV (cm/s)Flow Vol (mL/min)Comments +--------------------+----------+-----------------+--------+ Native artery inflow    70           701                +--------------------+----------+-----------------+--------+ AVF Anastomosis         93                              +--------------------+----------+-----------------+--------+   +------------+----------+-------------+----------+-----------------------------+ OUTFLOW VEINPSV (cm/s)Diameter (cm)Depth (cm)          Describe            +------------+----------+-------------+----------+-----------------------------+ Prox UA        175        0.41        1.10                                 +------------+----------+-------------+----------+-----------------------------+ Mid UA         417        0.30        0.11   stenotic and competing branch +------------+----------+-------------+----------+-----------------------------+ Dist UA        122        0.62        0.15                                 +------------+----------+-------------+----------+-----------------------------+  AC Fossa       448        0.27        0.34             stenotic            +------------+----------+-------------+----------+-----------------------------+  Summary: Arteriovenous fistula-Elevated velocities and a velocity ratio of greater than 3 noted in the mid upper arm.  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  Post-stenotic turbulence noted.   Medical Decision Making  CLAUDIA ALVIZO is a 78 y.o. male who is s/p second stage left basilic vein transposition on 12/16/2018.  Pt states he had non radiating chest pain and back pain last night, resolved with heating pad to his back. I advised his daughter Hilda Blades (by phone) to call pt's PCP today and let him or her know this.   He takes warfarin, has a history of atrial fib.   Left upper arm AVF duplex today shows stenosis in the antecubital fossa and mid upper arm. Post-stenotic turbulence noted. Dr. Donzetta Matters spoke with pt (and his daughter Hilda Blades by phone), and examined pt.  Will schedule pt for fistula-gram ASAP, soonest available VVS surgeon, possible intervention.    Clemon Chambers, RN, MSN, FNP-C [facility] Office: [phone]  05/23/2019, 9:20 AM  Clinic MD: Donzetta Matters

## 2019-05-26 ENCOUNTER — Ambulatory Visit (INDEPENDENT_AMBULATORY_CARE_PROVIDER_SITE_OTHER): Payer: Medicare HMO | Admitting: Licensed Clinical Social Worker

## 2019-05-26 DIAGNOSIS — R531 Weakness: Secondary | ICD-10-CM

## 2019-05-26 DIAGNOSIS — R296 Repeated falls: Secondary | ICD-10-CM

## 2019-05-26 DIAGNOSIS — I10 Essential (primary) hypertension: Secondary | ICD-10-CM | POA: Diagnosis not present

## 2019-05-26 DIAGNOSIS — F339 Major depressive disorder, recurrent, unspecified: Secondary | ICD-10-CM

## 2019-05-26 DIAGNOSIS — N186 End stage renal disease: Secondary | ICD-10-CM

## 2019-05-26 DIAGNOSIS — J441 Chronic obstructive pulmonary disease with (acute) exacerbation: Secondary | ICD-10-CM | POA: Diagnosis not present

## 2019-05-26 DIAGNOSIS — R413 Other amnesia: Secondary | ICD-10-CM

## 2019-05-26 NOTE — Chronic Care Management (AMB) (Signed)
  Care Management Note   Don [name] is a 78 y.o. year old male who is a primary care patient of Dettinger, Fransisca Kaufmann, MD. The CM team was consulted for assistance with chronic disease management and care coordination.   I reached out to Don [name] by phone today.   Review of patient status, including review of consultants reports, relevant laboratory and other test results, and collaboration with appropriate care team members and the patient's provider was performed as part of comprehensive patient evaluation and provision of chronic care management services.   Social Determinants of Health:risk of Social Isolation; risk of Tobacco Use    Chronic Care Management from 12/23/2018 in [place]  PHQ-9 Total Score  9     GAD 7 : Generalized Anxiety Score 05/25/2016  Nervous, Anxious, on Edge 1  Control/stop worrying 2  Worry too much - different things 2  Trouble relaxing 1  Restless 0  Easily annoyed or irritable 1  Afraid - awful might happen 0  Total GAD 7 Score 7  Anxiety Difficulty Not difficult at all   Goals        . "I get sad sometimes because I live alone" (pt-stated)       Current Barriers:   Client has transportation needs . Client has some ambulation challenges (uses cane to help with ambulation) . Social isolation issues  Clinical Social Work Clinical Goal(s): Over the next 30 days, client will verbalize understanding of depression symptoms management.  Interventions: . Discussed self care activities: sleep hygiene, basic healthy eating practices . Encouraged client or POA to call LCSW as needed to discuss management of depression symptoms of client . Encouraged client to use relaxation techniques (watching TV,talking via phone with Don [name] ([place])  . Provided counseling support for client  Patient Self Care Activities:  . Client socializes with family weekly as scheduled.  . Client contacts [name], step-son, POA, as needed for  daily needs/assistance of client. . Client takes medications as prescribed.  Plan:  . LCSW to contact client/POA Don [name]) in three weeks to discuss client needs related to depression management.  Don [name] [facility]) to contact LCSW related to psychosocial needs of client . [name] Client to use relaxation techniques of choice to help him manage depression symptoms.  . Client to attend scheduled medical appointments . Client or POA Don [name] to communicate with RN CM as needed to discuss nursing needs of client . Client to practice self care activities (eating meals on normal scheduled times, getting enough rest at night)   Client said he has an appointment this Friday for procedure on fistula. Client said he had appointment at office of Dr. Scot Dock last Friday.  Client said he has had some pain in his arm. He has support from Don [name]. He said he is scheduled for dialysis treatment tomorrow.  LCSW provided counseling support for client. LCSW talked with client about relaxation/recreational activities of choice for client.   Follow Up Plan: LCSW to contact client or POA in next 3 weeks to discuss client needs related to depression management.    Don [name].Don [name] MSW, LCSW Licensed Clinical Social Worker [facility] Care Management [phone]

## 2019-05-26 NOTE — Patient Instructions (Signed)
Licensed Clinical Social Worker Visit Information  Goals we discussed today:   Goals        . "I get sad sometimes because I live alone" (pt-stated)     Current Barriers:   Client has transportation needs . Client has some ambulation challenges (uses cane to help with ambulation) . Social isolation issues  Clinical Social Work Clinical Goal(s): Over the next 30 days, client will verbalize understanding of depression symptoms management.  Interventions: . Discussed self care activities: sleep hygiene, basic healthy eating practices . Encouraged client to participate in recreational/relaxation activities of choice  . Encouraged client or POA to call LCSW as needed to discuss management of depression symptoms of client . Encouraged client to use relaxation techniques (watching TV,talking via phone with [name] ([place]) . Provided counseling support for client  Patient Self Care Activities:  . Client socializes with family weekly as scheduled.  . Client contacts [name], step-son, POA, as needed for daily needs/assistance of client. . Client takes medications as prescribed.  Plan:  . LCSW to contact client/POA [name]) in three weeks to discuss client needs related to depression management.  [name] [facility]) to contact LCSW related to psychosocial needs of client . [name] Client to use relaxation techniques of choice to help him manage depression symptoms.  . Client to attend scheduled medical appointments . Client or POA [name] to communicate with RN CM as needed to discuss nursing needs of client . Client to practice self care activities (eating meals on normal scheduled times, getting enough rest at night)          Materials Provided: No  Follow Up Plan: LCSW to call client or POA in next 3 weeks to talk with client or POA about depression management for client.  The patient verbalized understanding of instructions provided today and declined a print  copy of patient instruction materials.   [name].Revecca Nachtigal MSW, LCSW Licensed Clinical Social Worker [facility] Care Management [phone]

## 2019-05-27 ENCOUNTER — Telehealth: Payer: Self-pay | Admitting: *Deleted

## 2019-05-27 NOTE — Telephone Encounter (Signed)
Goal INR 2.0-3.0 INR today is 3.0 perfect!!  Continue  current dose to 5 mg on Monday and Thursday and 4 mg the rest of the week  Recheck in 1 month

## 2019-05-27 NOTE — Telephone Encounter (Signed)
Patient is having a procedure on Friday and is to stop coumadin 3 das prior.  Wanted to make aware since numbers may be off at next reading

## 2019-05-27 NOTE — Telephone Encounter (Signed)
Fax received mdINR PT/INR self testing service Test date/time 05/27/19 10:07 am INR 3.0

## 2019-05-27 NOTE — Telephone Encounter (Signed)
Aware of dosage

## 2019-05-28 NOTE — Telephone Encounter (Signed)
Okay sounds good, have him restart the Coumadin the day after the procedure

## 2019-05-28 NOTE — Telephone Encounter (Signed)
Son aware to restart Coumadin the day after procedure

## 2019-05-29 ENCOUNTER — Other Ambulatory Visit: Payer: Self-pay

## 2019-05-29 ENCOUNTER — Other Ambulatory Visit (HOSPITAL_COMMUNITY)
Admission: RE | Admit: 2019-05-29 | Discharge: 2019-05-29 | Disposition: A | Discharge status: Home / Self Care | Payer: No Typology Code available for payment source | Source: Ambulatory Visit | Attending: Vascular Surgery | Admitting: Vascular Surgery

## 2019-05-29 DIAGNOSIS — Z1159 Encounter for screening for other viral diseases: Secondary | ICD-10-CM | POA: Insufficient documentation

## 2019-05-29 LAB — SARS CORONAVIRUS 2 BY RT PCR (HOSPITAL ORDER, PERFORMED IN ~~LOC~~ HOSPITAL LAB): SARS Coronavirus 2: NEGATIVE

## 2019-05-30 ENCOUNTER — Encounter (HOSPITAL_COMMUNITY)
Admission: RE | Disposition: A | Discharge status: Home / Self Care | Payer: Self-pay | Source: Ambulatory Visit | Attending: Vascular Surgery

## 2019-05-30 ENCOUNTER — Encounter (HOSPITAL_COMMUNITY): Payer: Self-pay | Admitting: Vascular Surgery

## 2019-05-30 ENCOUNTER — Ambulatory Visit (HOSPITAL_COMMUNITY)
Admission: RE | Admit: 2019-05-30 | Discharge: 2019-05-30 | Disposition: A | Discharge status: Home / Self Care | Payer: No Typology Code available for payment source | Source: Ambulatory Visit | Attending: Vascular Surgery | Admitting: Vascular Surgery

## 2019-05-30 ENCOUNTER — Other Ambulatory Visit: Payer: Self-pay

## 2019-05-30 ENCOUNTER — Telehealth: Payer: Self-pay | Admitting: *Deleted

## 2019-05-30 DIAGNOSIS — Y832 Surgical operation with anastomosis, bypass or graft as the cause of abnormal reaction of the patient, or of later complication, without mention of misadventure at the time of the procedure: Secondary | ICD-10-CM | POA: Diagnosis not present

## 2019-05-30 DIAGNOSIS — Z888 Allergy status to other drugs, medicaments and biological substances status: Secondary | ICD-10-CM | POA: Diagnosis not present

## 2019-05-30 DIAGNOSIS — Z7951 Long term (current) use of inhaled steroids: Secondary | ICD-10-CM | POA: Diagnosis not present

## 2019-05-30 DIAGNOSIS — I129 Hypertensive chronic kidney disease with stage 1 through stage 4 chronic kidney disease, or unspecified chronic kidney disease: Secondary | ICD-10-CM | POA: Diagnosis not present

## 2019-05-30 DIAGNOSIS — Z8249 Family history of ischemic heart disease and other diseases of the circulatory system: Secondary | ICD-10-CM | POA: Insufficient documentation

## 2019-05-30 DIAGNOSIS — Z833 Family history of diabetes mellitus: Secondary | ICD-10-CM | POA: Diagnosis not present

## 2019-05-30 DIAGNOSIS — T82858A Stenosis of vascular prosthetic devices, implants and grafts, initial encounter: Secondary | ICD-10-CM | POA: Diagnosis not present

## 2019-05-30 DIAGNOSIS — E785 Hyperlipidemia, unspecified: Secondary | ICD-10-CM | POA: Diagnosis not present

## 2019-05-30 DIAGNOSIS — Z87891 Personal history of nicotine dependence: Secondary | ICD-10-CM | POA: Diagnosis not present

## 2019-05-30 DIAGNOSIS — R7303 Prediabetes: Secondary | ICD-10-CM | POA: Diagnosis not present

## 2019-05-30 DIAGNOSIS — J449 Chronic obstructive pulmonary disease, unspecified: Secondary | ICD-10-CM | POA: Insufficient documentation

## 2019-05-30 DIAGNOSIS — Z79899 Other long term (current) drug therapy: Secondary | ICD-10-CM | POA: Insufficient documentation

## 2019-05-30 DIAGNOSIS — N186 End stage renal disease: Secondary | ICD-10-CM | POA: Diagnosis not present

## 2019-05-30 DIAGNOSIS — Z7901 Long term (current) use of anticoagulants: Secondary | ICD-10-CM | POA: Insufficient documentation

## 2019-05-30 DIAGNOSIS — T82898A Other specified complication of vascular prosthetic devices, implants and grafts, initial encounter: Secondary | ICD-10-CM | POA: Diagnosis not present

## 2019-05-30 DIAGNOSIS — R200 Anesthesia of skin: Secondary | ICD-10-CM | POA: Insufficient documentation

## 2019-05-30 DIAGNOSIS — N183 Chronic kidney disease, stage 3 (moderate): Secondary | ICD-10-CM | POA: Diagnosis not present

## 2019-05-30 DIAGNOSIS — Z992 Dependence on renal dialysis: Secondary | ICD-10-CM | POA: Insufficient documentation

## 2019-05-30 HISTORY — PX: PERIPHERAL VASCULAR BALLOON ANGIOPLASTY: CATH118281

## 2019-05-30 HISTORY — PX: A/V FISTULAGRAM: CATH118298

## 2019-05-30 LAB — POCT I-STAT, CHEM 8
BUN: 38 mg/dL — ABNORMAL HIGH (ref 8–23)
Calcium, Ion: 1.11 mmol/L — ABNORMAL LOW (ref 1.15–1.40)
Chloride: 98 mmol/L (ref 98–111)
Creatinine, Ser: 4.6 mg/dL — ABNORMAL HIGH (ref 0.61–1.24)
Glucose, Bld: 92 mg/dL (ref 70–99)
HCT: 38 % — ABNORMAL LOW (ref 39.0–52.0)
Hemoglobin: 12.9 g/dL — ABNORMAL LOW (ref 13.0–17.0)
Potassium: 4.2 mmol/L (ref 3.5–5.1)
Sodium: 138 mmol/L (ref 135–145)
TCO2: 30 mmol/L (ref 22–32)

## 2019-05-30 LAB — PROTIME-INR
INR: 1.4 — ABNORMAL HIGH (ref 0.8–1.2)
Prothrombin Time: 16.9 seconds — ABNORMAL HIGH (ref 11.4–15.2)

## 2019-05-30 SURGERY — A/V FISTULAGRAM
Anesthesia: LOCAL | Laterality: Left

## 2019-05-30 MED ORDER — HYDRALAZINE HCL 20 MG/ML IJ SOLN: 5.0000 mg | INTRAMUSCULAR | Status: DC | PRN

## 2019-05-30 MED ORDER — HEPARIN (PORCINE) IN NACL 1000-0.9 UT/500ML-% IV SOLN
INTRAVENOUS | Status: AC
  Filled 2019-05-30: qty 500

## 2019-05-30 MED ORDER — LABETALOL HCL 5 MG/ML IV SOLN: 10.0000 mg | INTRAVENOUS | Status: DC | PRN

## 2019-05-30 MED ORDER — HEPARIN (PORCINE) IN NACL 1000-0.9 UT/500ML-% IV SOLN
INTRAVENOUS | Status: DC | PRN
  Administered 2019-05-30: 500 mL

## 2019-05-30 MED ORDER — SODIUM CHLORIDE 0.9% FLUSH: 3.0000 mL | INTRAVENOUS | Status: DC | PRN

## 2019-05-30 MED ORDER — LIDOCAINE HCL (PF) 1 % IJ SOLN
INTRAMUSCULAR | Status: DC | PRN
  Administered 2019-05-30: 10 mL

## 2019-05-30 MED ORDER — ONDANSETRON HCL 4 MG/2ML IJ SOLN: 4.0000 mg | Freq: Four times a day (QID) | INTRAMUSCULAR | Status: DC | PRN

## 2019-05-30 MED ORDER — ACETAMINOPHEN 325 MG PO TABS: 650.0000 mg | ORAL_TABLET | ORAL | Status: DC | PRN

## 2019-05-30 MED ORDER — SODIUM CHLORIDE 0.9 % IV SOLN: 250.0000 mL | INTRAVENOUS | Status: DC | PRN

## 2019-05-30 MED ORDER — IODIXANOL 320 MG/ML IV SOLN
INTRAVENOUS | Status: DC | PRN
  Administered 2019-05-30: 50 mL via INTRAVENOUS

## 2019-05-30 MED ORDER — SODIUM CHLORIDE 0.9% FLUSH: 3.0000 mL | Freq: Two times a day (BID) | INTRAVENOUS | Status: DC

## 2019-05-30 MED ORDER — HEPARIN SODIUM (PORCINE) 1000 UNIT/ML IJ SOLN
INTRAMUSCULAR | Status: DC | PRN
  Administered 2019-05-30: 3000 [IU] via INTRAVENOUS

## 2019-05-30 MED ORDER — LIDOCAINE HCL (PF) 1 % IJ SOLN
INTRAMUSCULAR | Status: AC
  Filled 2019-05-30: qty 30

## 2019-05-30 SURGICAL SUPPLY — 16 items
BAG SNAP BAND KOVER 36X36 (MISCELLANEOUS) ×3 IMPLANT
BALLN MUSTANG 5.0X40 75 (BALLOONS) ×3
BALLN MUSTANG 6.0X40 75 (BALLOONS) ×3
BALLOON MUSTANG 5.0X40 75 (BALLOONS) IMPLANT
BALLOON MUSTANG 6.0X40 75 (BALLOONS) IMPLANT
COVER DOME SNAP 22 D (MISCELLANEOUS) ×3 IMPLANT
KIT ENCORE 26 ADVANTAGE (KITS) ×1 IMPLANT
KIT MICROPUNCTURE NIT STIFF (SHEATH) ×1 IMPLANT
PROTECTION STATION PRESSURIZED (MISCELLANEOUS) ×3
SHEATH PINNACLE R/O II 6F 4CM (SHEATH) ×1 IMPLANT
SHEATH PROBE COVER 6X72 (BAG) ×3 IMPLANT
STATION PROTECTION PRESSURIZED (MISCELLANEOUS) ×2 IMPLANT
STOPCOCK MORSE 400PSI 3WAY (MISCELLANEOUS) ×3 IMPLANT
TRAY PV CATH (CUSTOM PROCEDURE TRAY) ×3 IMPLANT
TUBING CIL FLEX 10 FLL-RA (TUBING) ×3 IMPLANT
WIRE BENTSON .035X145CM (WIRE) ×1 IMPLANT

## 2019-05-30 NOTE — Discharge Instructions (Signed)

## 2019-05-30 NOTE — Op Note (Signed)
Procedure: Left arm fistulogram angioplasty of 2 discrete lesions left upper arm AV fistula  Preoperative diagnosis: Poorly functioning left arm AV fistula  Postoperative diagnosis: Same  Anesthesia: Local  Operative findings: 2 discrete narrowings one 70% near the axilla, one mid fistula 70% both angioplastied to 0% residual stenosis  Operative details: After obtaining form consent, patient taken the PV lab.  The patient was placed in supine position Angie table.  Patient's left upper extremities prepped and draped in usual sterile fashion.  Local anesthesia was then treated of the left upper arm AV fistula.  Ultrasound was used to identify the fistula.  Micropuncture needle was used to cannulate the fistula under ultrasound guidance.  An image of the ultrasound was obtained for the patient's record.  Micropuncture wire was advanced into the guidewire and the micropuncture sheath placed over this.  Contrast angiogram was then obtained.  Central veins are all widely patent.  This includes the right subclavian innominate and superior vena cava.  The axillary portion of the fistula has a about a 70% narrowing.  There is also a 70% narrowing of the midportion of the fistula.  At this point it was decided to intervene on these.  The patient was given 3000 units of intravenous heparin.  A 5 x 40 angioplasty balloon was brought up and centered on the axillary portion of the fistula and inflated to nominal pressure for 1 minute.  The balloon was then pulled down to the midportion of the fistula and this was also angioplastied to nominal pressure for 1 minute.  While the balloon was inflated we refluxed contrast across the arterial anastomosis and this did not show any narrowing of the arterial end of the fistula.  Completion angiogram of the outflow showed there was still some residual stenosis of the axillary portion of the fistula.  Therefore I brought up a 6 x 40 balloon and inflated this to nominal pressure  for 1 minute.  Completion angiogram showed no significant stenosis throughout the entire fistula at this point.  Guidewire was removed sheath was removed and a figure-of-eight Monocryl stitch placed at the insertion site.  Hemostasis was obtained with direct pressure.  The patient tolerated procedure well and there were no complications.  Patient taken the holding area in stable condition.  Operative management: The patient had successful angioplasty of the midportion of his fistula in the axillary portion.  Overall this is a fairly small vein.  If he has continued problems with cannulation then consideration would need to be given for converting this over to a graft.  However, he certainly would be a candidate for repeat angioplasty if necessary.  [name], MD [facility] Office: [phone] Pager: [phone]

## 2019-05-30 NOTE — Interval H&P Note (Signed)
History and Physical Interval Note:  05/30/2019 7:29 AM  Don [name]  has presented today for surgery, with the diagnosis of Complication with fistula.  The various methods of treatment have been discussed with the patient and family. After consideration of risks, benefits and other options for treatment, the patient has consented to  Procedure(s): A/V FISTULAGRAM (Left) as a surgical intervention.  [name] history has been reviewed, patient examined, no change in status, stable for surgery.  I have reviewed the patient's chart and labs.  Questions were answered to the patient's satisfaction.     [name]

## 2019-05-30 NOTE — Telephone Encounter (Signed)
Documentation for future procedures if needed.

## 2019-05-30 NOTE — Interval H&P Note (Signed)
History and Physical Interval Note:  05/30/2019 7:27 AM  Don [name]  has presented today for surgery, with the diagnosis of Complication with fistula.  The various methods of treatment have been discussed with the patient and family. After consideration of risks, benefits and other options for treatment, the patient has consented to  Procedure(s): A/V FISTULAGRAM (Left) as a surgical intervention.  [name] history has been reviewed, patient examined, no change in status, stable for surgery.  I have reviewed the patient's chart and labs.  Questions were answered to the patient's satisfaction.     [name]

## 2019-05-30 NOTE — Telephone Encounter (Signed)
-----   Message from Elam Dutch, MD sent at 05/30/2019 11:21 AM EDT ----- Procedure: Ultrasound arm Left arm fistulogram angioplasty of 2 discrete lesions left upper arm AV fistula   Overall this is a fairly small vein.  If he has continued problems with cannulation then consideration would need to be given for converting this over to a graft.  However, he certainly would be a candidate for repeat angioplasty if necessary.  [name], MD [facility] Office: [phone] Pager: [phone]

## 2019-06-02 ENCOUNTER — Telehealth: Payer: Self-pay | Admitting: Family Medicine

## 2019-06-02 NOTE — Telephone Encounter (Signed)
Pt states dialysis told him to get Emla cream or lidocaine cream called into [facility].

## 2019-06-02 NOTE — Telephone Encounter (Signed)
Pt would like this cream for dialysis. (set you for you)

## 2019-06-03 ENCOUNTER — Telehealth: Payer: Self-pay | Admitting: *Deleted

## 2019-06-03 MED ORDER — LIDOCAINE-PRILOCAINE 2.5-2.5 % EX CREA: 1.0000 "application " | TOPICAL_CREAM | CUTANEOUS | 0 refills | Status: AC | PRN

## 2019-06-03 NOTE — Telephone Encounter (Signed)
Caretaker [name] aware

## 2019-06-03 NOTE — Telephone Encounter (Signed)
Description   Goal INR 2.0-3.0 INR today is 1.3 Patient had come off of Coumadin for procedure and has restarted it Continue current dose to 5 mg on Monday and Thursday and 4 mg the rest of the week Retest in 1 week     Caryl Pina, MD [place] 06/03/2019, 12:59 PM

## 2019-06-03 NOTE — Telephone Encounter (Signed)
Fax received mdINR PT/INR self testing service Test date/time 06/03/19 10:10 am INR 1.3

## 2019-06-03 NOTE — Telephone Encounter (Signed)
I sent a cream in for him

## 2019-06-09 ENCOUNTER — Ambulatory Visit: Payer: Self-pay | Admitting: Licensed Clinical Social Worker

## 2019-06-09 DIAGNOSIS — J441 Chronic obstructive pulmonary disease with (acute) exacerbation: Secondary | ICD-10-CM

## 2019-06-09 DIAGNOSIS — R531 Weakness: Secondary | ICD-10-CM

## 2019-06-09 DIAGNOSIS — R296 Repeated falls: Secondary | ICD-10-CM

## 2019-06-09 DIAGNOSIS — N186 End stage renal disease: Secondary | ICD-10-CM

## 2019-06-09 DIAGNOSIS — F339 Major depressive disorder, recurrent, unspecified: Secondary | ICD-10-CM

## 2019-06-09 DIAGNOSIS — I10 Essential (primary) hypertension: Secondary | ICD-10-CM

## 2019-06-09 DIAGNOSIS — R413 Other amnesia: Secondary | ICD-10-CM

## 2019-06-09 NOTE — Chronic Care Management (AMB) (Signed)
  Care Management Note   Don [name] is a 78 y.o. year old male who is a primary care patient of Dettinger, Fransisca Kaufmann, MD. The CM team was consulted for assistance with chronic disease management and care coordination.   I reached out to [place], step son POA by phone today.   Review of patient status, including review of consultants reports, relevant laboratory and other test results, and collaboration with appropriate care team members and the patient's provider was performed as part of comprehensive patient evaluation and provision of chronic care management services.  Social Determinants of Health: risk of tobacco use; risk of social isolation    Chronic Care Management from 12/23/2018 in [facility]  PHQ-9 Total Score  9     GAD 7 : Generalized Anxiety Score 05/25/2016  Nervous, Anxious, on Edge 1  Control/stop worrying 2  Worry too much - different things 2  Trouble relaxing 1  Restless 0  Easily annoyed or irritable 1  Afraid - awful might happen 0  Total GAD 7 Score 7  Anxiety Difficulty Not difficult at all    Goals Discussed:       . "I get sad sometimes because I live alone" (pt-stated)     Current Barriers:   Client has transportation needs . Client has some ambulation challenges (uses cane to help with ambulation) . Social isolation issues  Clinical Social Work Clinical Goal(s): Over the next 30 days, client will verbalize understanding of depression symptoms management.  Interventions: . Talked with Don [name] POA about current needs of client . Previously encouraged client to participate in recreational/relaxation activities of choice  . Encouraged client or POA to call LCSW as needed to discuss management of depression symptoms of client . Previously encouraged client to use relaxation techniques (watching TV,talking via phone with Don [name] ([place]) . Previously encouraged client to attend scheduled client  medical appointments  Patient Self Care Activities:  . Client socializes with family weekly as scheduled.  . Client contacts Don [name], step-son, POA, as needed for daily needs/assistance of client. . Client takes medications as prescribed.  Plan:  . LCSW to contact client/POA Don [name]) in three weeks to discuss client needs related to depression management.  Don [name] [facility]) to contact LCSW related to psychosocial needs of client . [name] Client to use relaxation techniques of choice to help him manage depression symptoms.  . Client to attend scheduled medical appointments . Client or POA Don [name] to communicate with RN CM as needed to discuss nursing needs of client . Client to practice self care activities (eating meals on normal scheduled times, getting enough rest at night)   Don [name] and LCSW spoke today regarding client status and needs. Don [name] reported client is attending scheduled dialysis appointments. Client has transport assistance as needed from family members. Client has prescribed medications and is taking medications as prescribed. LCSW encouraged client or Don [name] to contact RNCM as needed to discuss nursing needs of client   Follow Up Plan: LCSW to contact client/ POA Don [name] in next 3 weeks to discuss client needs related to depression managment  Don [name].Don [name] MSW, LCSW Licensed Clinical Social Worker [facility] Care Management [phone]

## 2019-06-09 NOTE — Patient Instructions (Signed)
Licensed Clinical Social Worker Visit Information  Goals we discussed today:   Goals        . "I get sad sometimes because I live alone" (pt-stated)     Current Barriers:   Client has transportation needs . Client has some ambulation challenges (uses cane to help with ambulation) . Social isolation issues  Clinical Social Work Clinical Goal(s): Over the next 30 days, client will verbalize understanding of depression symptoms management.  Interventions: . Talked with [name] POA about current needs of client . Encouraged client to participate in recreational/relaxation activities of choice  . Encouraged client or POA to call LCSW as needed to discuss management of depression symptoms of client . Encouraged client to use relaxation techniques (watching TV,talking via phone with [name] ([place]) . Encouraged client to attend scheduled client medical appointments  Patient Self Care Activities:  . Client socializes with family weekly as scheduled.  . Client contacts [name], step-son, POA, as needed for daily needs/assistance of client. . Client takes medications as prescribed.  Plan:  . LCSW to contact client/POA [name]) in three weeks to discuss client needs related to depression management.  [name] [facility]) to contact LCSW related to psychosocial needs of client . [name] Client to use relaxation techniques of choice to help him manage depression symptoms.  . Client to attend scheduled medical appointments . Client or POA [name] to communicate with RN CM as needed to discuss nursing needs of client . Client to practice self care activities (eating meals on normal scheduled times, getting enough rest at night)      Materials Provided: No  Follow Up Plan: LCSW to call client or POA in next 3 weeks to talk with client or POA about client's management of depression symptoms faced.   The patient/[name] verbalized understanding of instructions  provided today and declined a print copy of patient instruction materials.   [name].Havannah Streat MSW, LCSW Licensed Clinical Social Worker [facility] [phone]

## 2019-06-10 ENCOUNTER — Other Ambulatory Visit: Payer: Self-pay | Admitting: Family Medicine

## 2019-06-16 ENCOUNTER — Other Ambulatory Visit: Payer: Self-pay | Admitting: *Deleted

## 2019-06-17 ENCOUNTER — Encounter (HOSPITAL_COMMUNITY): Payer: Self-pay | Admitting: *Deleted

## 2019-06-17 ENCOUNTER — Other Ambulatory Visit: Payer: Self-pay

## 2019-06-17 ENCOUNTER — Telehealth: Payer: Self-pay | Admitting: *Deleted

## 2019-06-17 DIAGNOSIS — Z7901 Long term (current) use of anticoagulants: Secondary | ICD-10-CM | POA: Diagnosis not present

## 2019-06-17 DIAGNOSIS — I480 Paroxysmal atrial fibrillation: Secondary | ICD-10-CM | POA: Diagnosis not present

## 2019-06-17 NOTE — Progress Notes (Signed)
Spoke with Don [name], pt's POA and son. Pt has hx of A-fib and is on Coumadin. Don [name] states they were instructed by the office that 06/19/19 will be the last dose of Coumadin prior to surgery. Pt's cardiologist is Dr. Percival Spanish. Don [name] states pt has not c/o any recent chest pain or sob. Pt has memory issues - dementia. Pt is pre-diabetic. He does not check his blood sugar at home. Last A1C was 4.9 in January, 2020.   Don [name] asked me to email him the medications that pt is to take on day of surgery, Don [name] was unable to write them down while we were talking. Email sent to him.  Pt is to have his Covid test done on 06/19/19. Pt will be in quarantine afterwards except when he goes to dialysis. He will wear a mask.   Coronavirus Screening  Have you experienced the following symptoms:  Cough NO Fever (>100.46F) NO  Runny nose NO Sore throat NO Difficulty breathing/shortness of breath  NO  Have you or a family member traveled in the last 14 days and where? NO  Don [name] was reminded that hospital visitation restrictions are in effect and the importance of the restrictions. Since patient has dementia, Don [name] will be allowed to be in the pre-op area to answer questions and sign consent form.

## 2019-06-17 NOTE — Telephone Encounter (Signed)
Goal INR 2.0-3.0 INR today is 3.4 (too thin)   Hold today's dose ( 06/17/19) then decrease to 4 mg everyday.  Retest in 1 week

## 2019-06-17 NOTE — Telephone Encounter (Signed)
Fax received mdINR PT/INR self testing service Test date/time 06/17/19 10:46 am INR 3.4

## 2019-06-18 NOTE — Telephone Encounter (Signed)
Current instructed dose: 4 mg daily  Was held 06/18/19 because INR was 3.4.  It appears the patient had gone a couple of weeks without having it retested.  He is having a procedure performed on Monday.  He was supposed to discontinue the medication on Friday for this procedure.  So I want him to take the dose today and then begin the stoppage tomorrow as instructed.  Resume on the same dose and have INR checked at the end of next week.

## 2019-06-18 NOTE — Telephone Encounter (Signed)
POA [name] aware and Pharmacy aware of instructions.

## 2019-06-18 NOTE — Telephone Encounter (Signed)
Patient caretaker that he is stop his warfarin on Friday for a procedure on Monday. They want to know if he needs to still hold tonight's dose since he is stopping warfarin on Friday for procedure.

## 2019-06-19 ENCOUNTER — Other Ambulatory Visit: Payer: Self-pay

## 2019-06-19 ENCOUNTER — Other Ambulatory Visit (HOSPITAL_COMMUNITY)
Admission: RE | Admit: 2019-06-19 | Discharge: 2019-06-19 | Disposition: A | Discharge status: Home / Self Care | Payer: No Typology Code available for payment source | Source: Ambulatory Visit | Attending: Vascular Surgery | Admitting: Vascular Surgery

## 2019-06-19 DIAGNOSIS — Z20828 Contact with and (suspected) exposure to other viral communicable diseases: Secondary | ICD-10-CM | POA: Insufficient documentation

## 2019-06-19 DIAGNOSIS — Z01812 Encounter for preprocedural laboratory examination: Secondary | ICD-10-CM | POA: Insufficient documentation

## 2019-06-19 LAB — SARS CORONAVIRUS 2 (TAT 6-24 HRS): SARS Coronavirus 2: NEGATIVE

## 2019-06-20 ENCOUNTER — Telehealth: Payer: Self-pay | Admitting: *Deleted

## 2019-06-20 NOTE — Telephone Encounter (Signed)
Time change for arrival on 06/23/2019 for surgery called to [facility]. Instructed to be at [facility] admitting department at 10:00 am. All other pre-op instructions unchanged. Verbalized understanding.

## 2019-06-23 ENCOUNTER — Encounter (HOSPITAL_COMMUNITY)
Admission: RE | Disposition: A | Discharge status: Home / Self Care | Payer: Self-pay | Source: Home / Self Care | Attending: Vascular Surgery

## 2019-06-23 ENCOUNTER — Ambulatory Visit (HOSPITAL_COMMUNITY): Payer: No Typology Code available for payment source | Admitting: Anesthesiology

## 2019-06-23 ENCOUNTER — Other Ambulatory Visit: Payer: Self-pay

## 2019-06-23 ENCOUNTER — Ambulatory Visit (HOSPITAL_COMMUNITY)
Admission: RE | Admit: 2019-06-23 | Discharge: 2019-06-23 | Disposition: A | Discharge status: Home / Self Care | Payer: No Typology Code available for payment source | Attending: Vascular Surgery | Admitting: Vascular Surgery

## 2019-06-23 ENCOUNTER — Encounter (HOSPITAL_COMMUNITY): Payer: Self-pay

## 2019-06-23 DIAGNOSIS — Z79899 Other long term (current) drug therapy: Secondary | ICD-10-CM | POA: Diagnosis not present

## 2019-06-23 DIAGNOSIS — E785 Hyperlipidemia, unspecified: Secondary | ICD-10-CM | POA: Insufficient documentation

## 2019-06-23 DIAGNOSIS — H3530 Unspecified macular degeneration: Secondary | ICD-10-CM | POA: Diagnosis not present

## 2019-06-23 DIAGNOSIS — I4891 Unspecified atrial fibrillation: Secondary | ICD-10-CM | POA: Diagnosis not present

## 2019-06-23 DIAGNOSIS — T82858A Stenosis of vascular prosthetic devices, implants and grafts, initial encounter: Secondary | ICD-10-CM | POA: Diagnosis not present

## 2019-06-23 DIAGNOSIS — I120 Hypertensive chronic kidney disease with stage 5 chronic kidney disease or end stage renal disease: Secondary | ICD-10-CM | POA: Diagnosis not present

## 2019-06-23 DIAGNOSIS — J449 Chronic obstructive pulmonary disease, unspecified: Secondary | ICD-10-CM | POA: Insufficient documentation

## 2019-06-23 DIAGNOSIS — Z7901 Long term (current) use of anticoagulants: Secondary | ICD-10-CM | POA: Insufficient documentation

## 2019-06-23 DIAGNOSIS — Y832 Surgical operation with anastomosis, bypass or graft as the cause of abnormal reaction of the patient, or of later complication, without mention of misadventure at the time of the procedure: Secondary | ICD-10-CM | POA: Diagnosis not present

## 2019-06-23 DIAGNOSIS — I110 Hypertensive heart disease with heart failure: Secondary | ICD-10-CM | POA: Insufficient documentation

## 2019-06-23 DIAGNOSIS — T82898A Other specified complication of vascular prosthetic devices, implants and grafts, initial encounter: Secondary | ICD-10-CM | POA: Diagnosis not present

## 2019-06-23 DIAGNOSIS — Z8546 Personal history of malignant neoplasm of prostate: Secondary | ICD-10-CM | POA: Insufficient documentation

## 2019-06-23 DIAGNOSIS — Z992 Dependence on renal dialysis: Secondary | ICD-10-CM | POA: Insufficient documentation

## 2019-06-23 DIAGNOSIS — Z7951 Long term (current) use of inhaled steroids: Secondary | ICD-10-CM | POA: Diagnosis not present

## 2019-06-23 DIAGNOSIS — I509 Heart failure, unspecified: Secondary | ICD-10-CM | POA: Diagnosis not present

## 2019-06-23 DIAGNOSIS — F419 Anxiety disorder, unspecified: Secondary | ICD-10-CM | POA: Insufficient documentation

## 2019-06-23 DIAGNOSIS — Z87891 Personal history of nicotine dependence: Secondary | ICD-10-CM | POA: Diagnosis not present

## 2019-06-23 DIAGNOSIS — N186 End stage renal disease: Secondary | ICD-10-CM | POA: Insufficient documentation

## 2019-06-23 DIAGNOSIS — F329 Major depressive disorder, single episode, unspecified: Secondary | ICD-10-CM | POA: Diagnosis not present

## 2019-06-23 HISTORY — PX: AV FISTULA PLACEMENT: SHX1204

## 2019-06-23 HISTORY — DX: Unspecified dementia, unspecified severity, without behavioral disturbance, psychotic disturbance, mood disturbance, and anxiety: F03.90

## 2019-06-23 LAB — APTT: aPTT: 39 seconds — ABNORMAL HIGH (ref 24–36)

## 2019-06-23 LAB — GLUCOSE, CAPILLARY: Glucose-Capillary: 79 mg/dL (ref 70–99)

## 2019-06-23 LAB — POCT I-STAT 4, (NA,K, GLUC, HGB,HCT)
Glucose, Bld: 90 mg/dL (ref 70–99)
HCT: 35 % — ABNORMAL LOW (ref 39.0–52.0)
Hemoglobin: 11.9 g/dL — ABNORMAL LOW (ref 13.0–17.0)
Potassium: 4.2 mmol/L (ref 3.5–5.1)
Sodium: 139 mmol/L (ref 135–145)

## 2019-06-23 LAB — PROTIME-INR
INR: 1.3 — ABNORMAL HIGH (ref 0.8–1.2)
Prothrombin Time: 16 seconds — ABNORMAL HIGH (ref 11.4–15.2)

## 2019-06-23 SURGERY — INSERTION OF ARTERIOVENOUS (AV) GORE-TEX GRAFT ARM
Anesthesia: General | Site: Arm Upper | Laterality: Left

## 2019-06-23 MED ORDER — 0.9 % SODIUM CHLORIDE (POUR BTL) OPTIME
TOPICAL | Status: DC | PRN
  Administered 2019-06-23: 1000 mL

## 2019-06-23 MED ORDER — ONDANSETRON HCL 4 MG/2ML IJ SOLN: 4.0000 mg | Freq: Once | INTRAMUSCULAR | Status: DC | PRN

## 2019-06-23 MED ORDER — DEXAMETHASONE SODIUM PHOSPHATE 10 MG/ML IJ SOLN
INTRAMUSCULAR | Status: AC
  Filled 2019-06-23: qty 1

## 2019-06-23 MED ORDER — LIDOCAINE 2% (20 MG/ML) 5 ML SYRINGE
INTRAMUSCULAR | Status: DC | PRN
  Administered 2019-06-23: 80 mg via INTRAVENOUS

## 2019-06-23 MED ORDER — LIDOCAINE HCL (PF) 1 % IJ SOLN
INTRAMUSCULAR | Status: AC
  Filled 2019-06-23: qty 30

## 2019-06-23 MED ORDER — ONDANSETRON HCL 4 MG/2ML IJ SOLN
INTRAMUSCULAR | Status: DC | PRN
  Administered 2019-06-23: 4 mg via INTRAVENOUS

## 2019-06-23 MED ORDER — ONDANSETRON HCL 4 MG/2ML IJ SOLN
INTRAMUSCULAR | Status: AC
  Filled 2019-06-23: qty 2

## 2019-06-23 MED ORDER — LACTATED RINGERS IV SOLN
INTRAVENOUS | Status: DC | PRN
  Administered 2019-06-23: 13:00:00 via INTRAVENOUS

## 2019-06-23 MED ORDER — PROPOFOL 10 MG/ML IV BOLUS
INTRAVENOUS | Status: DC | PRN
  Administered 2019-06-23: 20 mg via INTRAVENOUS
  Administered 2019-06-23: 100 mg via INTRAVENOUS
  Administered 2019-06-23: 20 mg via INTRAVENOUS
  Administered 2019-06-23: 30 mg via INTRAVENOUS

## 2019-06-23 MED ORDER — SODIUM CHLORIDE 0.9 % IV SOLN
INTRAVENOUS | Status: DC | PRN
  Administered 2019-06-23: 500 mL

## 2019-06-23 MED ORDER — PROPOFOL 10 MG/ML IV BOLUS
INTRAVENOUS | Status: AC
  Filled 2019-06-23: qty 20

## 2019-06-23 MED ORDER — SODIUM CHLORIDE 0.9 % IV SOLN
INTRAVENOUS | Status: DC | PRN
  Administered 2019-06-23: 20 ug/min via INTRAVENOUS

## 2019-06-23 MED ORDER — ACETAMINOPHEN 500 MG PO TABS
1000.0000 mg | ORAL_TABLET | Freq: Once | ORAL | Status: AC
  Administered 2019-06-23: 11:00:00 1000 mg via ORAL
  Filled 2019-06-23: qty 2

## 2019-06-23 MED ORDER — SODIUM CHLORIDE 0.9 % IV SOLN
INTRAVENOUS | Status: DC
  Administered 2019-06-23: 11:00:00 via INTRAVENOUS

## 2019-06-23 MED ORDER — FENTANYL CITRATE (PF) 250 MCG/5ML IJ SOLN
INTRAMUSCULAR | Status: AC
  Filled 2019-06-23: qty 5

## 2019-06-23 MED ORDER — LIDOCAINE HCL (PF) 1 % IJ SOLN
INTRAMUSCULAR | Status: DC | PRN
  Administered 2019-06-23: 17 mL

## 2019-06-23 MED ORDER — PHENYLEPHRINE 40 MCG/ML (10ML) SYRINGE FOR IV PUSH (FOR BLOOD PRESSURE SUPPORT)
PREFILLED_SYRINGE | INTRAVENOUS | Status: AC
  Filled 2019-06-23: qty 10

## 2019-06-23 MED ORDER — PHENYLEPHRINE 40 MCG/ML (10ML) SYRINGE FOR IV PUSH (FOR BLOOD PRESSURE SUPPORT)
PREFILLED_SYRINGE | INTRAVENOUS | Status: DC | PRN
  Administered 2019-06-23 (×4): 80 ug via INTRAVENOUS

## 2019-06-23 MED ORDER — OXYCODONE-ACETAMINOPHEN 5-325 MG PO TABS: 1.0000 | ORAL_TABLET | Freq: Four times a day (QID) | ORAL | 0 refills | Status: DC | PRN

## 2019-06-23 MED ORDER — CEFAZOLIN SODIUM-DEXTROSE 2-4 GM/100ML-% IV SOLN
2.0000 g | INTRAVENOUS | Status: AC
  Administered 2019-06-23: 2 g via INTRAVENOUS
  Filled 2019-06-23: qty 100

## 2019-06-23 MED ORDER — ROCURONIUM BROMIDE 10 MG/ML (PF) SYRINGE
PREFILLED_SYRINGE | INTRAVENOUS | Status: AC
  Filled 2019-06-23: qty 10

## 2019-06-23 MED ORDER — SODIUM CHLORIDE 0.9 % IV SOLN
INTRAVENOUS | Status: AC
  Filled 2019-06-23: qty 1.2

## 2019-06-23 MED ORDER — FENTANYL CITRATE (PF) 100 MCG/2ML IJ SOLN
INTRAMUSCULAR | Status: DC | PRN
  Administered 2019-06-23 (×2): 25 ug via INTRAVENOUS

## 2019-06-23 MED ORDER — DEXAMETHASONE SODIUM PHOSPHATE 10 MG/ML IJ SOLN
INTRAMUSCULAR | Status: DC | PRN
  Administered 2019-06-23: 4 mg via INTRAVENOUS

## 2019-06-23 MED ORDER — HEPARIN SODIUM (PORCINE) 1000 UNIT/ML IJ SOLN
INTRAMUSCULAR | Status: DC | PRN
  Administered 2019-06-23: 7000 [IU] via INTRAVENOUS

## 2019-06-23 MED ORDER — FENTANYL CITRATE (PF) 100 MCG/2ML IJ SOLN: 25.0000 ug | INTRAMUSCULAR | Status: DC | PRN

## 2019-06-23 MED ORDER — PROTAMINE SULFATE 10 MG/ML IV SOLN
INTRAVENOUS | Status: AC
  Filled 2019-06-23: qty 10

## 2019-06-23 MED ORDER — LIDOCAINE 2% (20 MG/ML) 5 ML SYRINGE
INTRAMUSCULAR | Status: AC
  Filled 2019-06-23: qty 5

## 2019-06-23 SURGICAL SUPPLY — 35 items
ARMBAND PINK RESTRICT EXTREMIT (MISCELLANEOUS) ×6 IMPLANT
CANISTER SUCT 3000ML PPV (MISCELLANEOUS) ×3 IMPLANT
CANNULA VESSEL 3MM 2 BLNT TIP (CANNULA) ×3 IMPLANT
CLIP VESOCCLUDE MED 6/CT (CLIP) ×3 IMPLANT
CLIP VESOCCLUDE SM WIDE 6/CT (CLIP) ×3 IMPLANT
COVER WAND RF STERILE (DRAPES) ×3 IMPLANT
DECANTER SPIKE VIAL GLASS SM (MISCELLANEOUS) ×3 IMPLANT
DERMABOND ADVANCED (GAUZE/BANDAGES/DRESSINGS) ×2
DERMABOND ADVANCED .7 DNX12 (GAUZE/BANDAGES/DRESSINGS) ×1 IMPLANT
ELECT REM PT RETURN 9FT ADLT (ELECTROSURGICAL) ×3
ELECTRODE REM PT RTRN 9FT ADLT (ELECTROSURGICAL) ×1 IMPLANT
GLOVE BIO SURGEON STRL SZ 6.5 (GLOVE) ×6 IMPLANT
GLOVE BIO SURGEON STRL SZ7 (GLOVE) ×3 IMPLANT
GLOVE BIO SURGEON STRL SZ7.5 (GLOVE) ×3 IMPLANT
GLOVE BIO SURGEONS STRL SZ 6.5 (GLOVE) ×3
GLOVE BIOGEL PI IND STRL 6.5 (GLOVE) ×2 IMPLANT
GLOVE BIOGEL PI INDICATOR 6.5 (GLOVE) ×4
GOWN STRL REUS W/ TWL LRG LVL3 (GOWN DISPOSABLE) ×3 IMPLANT
GOWN STRL REUS W/TWL LRG LVL3 (GOWN DISPOSABLE) ×6
GRAFT GORETEX 6X40 (Vascular Products) ×3 IMPLANT
HEMOSTAT SPONGE AVITENE ULTRA (HEMOSTASIS) IMPLANT
KIT BASIN OR (CUSTOM PROCEDURE TRAY) ×3 IMPLANT
KIT TURNOVER KIT B (KITS) ×3 IMPLANT
NS IRRIG 1000ML POUR BTL (IV SOLUTION) ×3 IMPLANT
PACK CV ACCESS (CUSTOM PROCEDURE TRAY) ×3 IMPLANT
PAD ARMBOARD 7.5X6 YLW CONV (MISCELLANEOUS) ×6 IMPLANT
SUT ETHILON 3 0 PS 1 (SUTURE) ×9 IMPLANT
SUT PROLENE 6 0 CC (SUTURE) ×6 IMPLANT
SUT VIC AB 3-0 SH 27 (SUTURE) ×4
SUT VIC AB 3-0 SH 27X BRD (SUTURE) ×2 IMPLANT
SUT VICRYL 4-0 PS2 18IN ABS (SUTURE) ×6 IMPLANT
SYR TOOMEY 50ML (SYRINGE) IMPLANT
TOWEL GREEN STERILE (TOWEL DISPOSABLE) ×3 IMPLANT
UNDERPAD 30X30 (UNDERPADS AND DIAPERS) ×3 IMPLANT
WATER STERILE IRR 1000ML POUR (IV SOLUTION) ×3 IMPLANT

## 2019-06-23 NOTE — H&P (Signed)
CC: malfunction of left upper arm AVF  History of Present Illness  Don [name] is a 78 y.o. ([birth year]) male who is s/p second stage left basilic vein transposition on 12/16/2018 by Dr. Scot Dock.   He has also had venoplasty of 3 stenoses within the fistula on 03-14-19 by Dr. Scot Dock.   He returns today at the request of Dr. Lowanda Foster for evaluation of left upper arm AVF; pt describes what sounds like infiltration from accessing; pt states he was told there was too much pressure in the AVF. It is unclear from pt how long this has been problematic.    He dialyzes on Tuesdays Thursdays and Saturdays.   He reports numbness at the medial aspect of his left forearm "for quit a while".  He does not seem to have left hand problems.   He smoked for 62 years, quit in December 2019.   The patient is bilateal hand dominant.         Past Medical History:  Diagnosis Date  . Acute bronchitis 04/03/2014  . Anemia    low iron  . Anxiety   . Atrial fibrillation ([place])   . Cataract   . COPD (chronic obstructive pulmonary disease) ([place])   . Depression   . Essential hypertension   . Hyperlipidemia   . Macular degeneration   . Noncompliance with medications 04/2013   Xarelto, digoxin previously  . Pre-diabetes   . Prostate cancer ([place])   . 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 Tubular adenoma of colon 07/31/02, 11/18/03    Social History Social History        Tobacco Use  . Smoking status: Former Smoker    Packs/day: 0.50    Years: 62.00    Pack years: 31.00    Types: Cigarettes, Cigars    Quit date: 10/27/2018    Years since quitting: 0.5  . Smokeless tobacco: Never Used  . Tobacco comment: "very little"  Substance Use Topics  . Alcohol use: No  . Drug use: No    Family History      Family History  Problem Relation Age of Onset  . Diabetes Father   . Heart disease Father 82       MI  .  Heart attack Father   . Heart attack Mother   . Heart disease Brother   . Cancer Brother        lung  . Lung cancer Brother   . Heart disease Brother   . Lung cancer Sister   . Cancer Sister        lung  . Heart disease Brother   . Heart disease Brother     Surgical History      Past Surgical History:  Procedure Laterality Date  . Anal abcess,Hemorroids,    . BASCILIC VEIN TRANSPOSITION Left 10/08/2018   Procedure: FIRST STAGE BASILIC VEIN TRANSPOSITION LEFT ARM;  Surgeon: Angelia Mould, MD;  Location: [facility];  Service: Vascular;  Laterality: Left;  . BASCILIC VEIN TRANSPOSITION Left 12/16/2018   Procedure: BASILIC VEIN TRANSPOSITION SECOND STAGE;  Surgeon: Angelia Mould, MD;  Location: [facility] OR;  Service: Vascular;  Laterality: Left;  . COLONOSCOPY N/A 04/05/2014   Dr. Fuller Plan: 6 mm sessile polyp from sigmoid colon (tubular adenoma), internal hemorrhoids  . COLONOSCOPY W/ BIOPSIES  11/18/2003   Dr. Earle Gell  . ESOPHAGOGASTRODUODENOSCOPY  11/18/2003   Dr. Earle Gell  . ESOPHAGOGASTRODUODENOSCOPY N/A 04/05/2014  Dr. Fuller Plan: variable Z line, negative Barrett's, small hiatal hernia  . ESOPHAGOGASTRODUODENOSCOPY N/A 02/02/2017   Dr. Oneida Alar: LA Grade B esophagitis, one moderate benign-appearing intrinsic stenosis traversed, small non-bleeding diverticulum in second portion of duodenum, reactive gastropathy, no H.pylori.  . ESOPHAGOGASTRODUODENOSCOPY N/A 03/22/2018   Procedure: ESOPHAGOGASTRODUODENOSCOPY (EGD);  Surgeon: Daneil Dolin, MD;  [facility];  Service: Endoscopy;  Laterality: N/A;  . GIVENS CAPSULE STUDY N/A 03/22/2018   Procedure: GIVENS CAPSULE STUDY;  Surgeon: Daneil Dolin, MD;  [facility];  Service: Endoscopy;  Laterality: N/A;  . IR FLUORO GUIDE CV LINE RIGHT  10/29/2018  . IR US GUIDE VASC ACCESS RIGHT  10/29/2018  . PERIPHERAL VASCULAR BALLOON ANGIOPLASTY Left 03/14/2019   Procedure: PERIPHERAL  VASCULAR BALLOON ANGIOPLASTY;  Surgeon: Angelia Mould, MD;  Location: [facility] CV [facility];  Service: Cardiovascular;  Laterality: Left;  . RETROPUBIC PROSTATECTOMY  11/26/2001  . TONSILLECTOMY          Allergies  Allergen Reactions  . Wellbutrin [Bupropion] Anxiety          Current Outpatient Medications  Medication Sig Dispense Refill  . acetaminophen (TYLENOL) 500 MG tablet Take 500 mg by mouth every 6 (six) hours as needed for moderate pain.     [name] albuterol (PROAIR HFA) 108 (90 Base) MCG/ACT inhaler Inhale 2 puffs into the lungs 2 (two) times daily.     [name] atorvastatin (LIPITOR) 20 MG tablet Take 1 tablet (20 mg total) by mouth daily. (Needs to be seen) (Patient taking differently: Take 20 mg by mouth at bedtime. ) 90 tablet 3  . bisoprolol (ZEBETA) 10 MG tablet TAKE (1) TABLET TWICE A DAY. (Patient taking differently: Take 10 mg by mouth every morning. ) 180 tablet 1  . budesonide-formoterol (SYMBICORT) 160-4.5 MCG/ACT inhaler Inhale 2 puffs into the lungs 2 (two) times daily.    . calcitRIOL (ROCALTROL) 0.25 MCG capsule Take 1 capsule (0.25 mcg total) by mouth every morning. 90 capsule 3  . clonazePAM (KLONOPIN) 1 MG tablet TAKE 1/2 TABLET AT BEDTIME 15 tablet 1  . cyclobenzaprine (FLEXERIL) 10 MG tablet Take 1 tablet (10 mg total) by mouth 3 (three) times daily as needed for muscle spasms. 30 tablet 0  . diltiazem (CARDIZEM CD) 120 MG 24 hr capsule Take 1 capsule (120 mg total) by mouth daily. 90 capsule 3  . ferrous sulfate 325 (65 FE) MG tablet Take 325 mg by mouth every Monday, Wednesday, and Friday.    . fluticasone (FLONASE) 50 MCG/ACT nasal spray Place 2 sprays into both nostrils daily. 16 g 6  . hydroxypropyl methylcellulose / hypromellose (ISOPTO TEARS / GONIOVISC) 2.5 % ophthalmic solution Place 1 drop into both eyes daily as needed for dry eyes.    . iron polysaccharides (FERREX 150) 150 MG capsule Take 1 capsule (150 mg total) by mouth daily. (Needs to  be seen) (Patient taking differently: Take 150 mg by mouth every morning. (Needs to be seen)) 90 capsule 3  . loratadine (CLARITIN) 10 MG tablet TAKE 1 TABLET DAILY 30 tablet 5  . Melatonin 3 MG TABS Take 3 mg by mouth at bedtime.     . Multiple Vitamin (MULTIVITAMIN WITH MINERALS) TABS tablet Take 1 tablet by mouth at bedtime.     . Multiple Vitamins-Minerals (PRESERVISION AREDS 2 PO) Take 1 tablet by mouth 2 (two) times daily.     . multivitamin (RENA-VIT) TABS tablet Take 1 tablet by mouth at bedtime. 30 tablet 0  .  omega-3 acid ethyl esters (LOVAZA) 1 g capsule Take 2 g by mouth 2 (two) times daily.    . pantoprazole (PROTONIX) 40 MG tablet TAKE (1) TABLET TWICE A DAY. 180 tablet 0  . terazosin (HYTRIN) 2 MG capsule TAKE 1 CAPSULE AT BEDTIME (Patient taking differently: Take 2 mg by mouth at bedtime. ) 90 capsule 0  . terbinafine (LAMISIL) 250 MG tablet Take it 3 times weekly on dialysis day after dialysis 36 tablet 1  . tiotropium (SPIRIVA) 18 MCG inhalation capsule Place 18 mcg into inhaler and inhale daily as needed (for shortness of breath).     . warfarin (COUMADIN) 2 MG tablet Take 4mg  daily except Fridays take 3mg . 30 tablet 3  . nicotine (NICODERM CQ - DOSED IN MG/24 HOURS) 14 mg/24hr patch Place 1 patch (14 mg total) onto the skin daily. (Patient not taking: Reported on 05/23/2019) 28 patch 2  . Nutritional Supplements (FEEDING SUPPLEMENT, NEPRO CARB STEADY,) LIQD Take 237 mLs by mouth 2 (two) times daily between meals. (Patient not taking: Reported on 05/23/2019) 60 Can 0   No current facility-administered medications for this visit.      REVIEW OF SYSTEMS: see HPI for pertinent positives and negatives    PHYSICAL EXAMINATION:     Vitals:   05/23/19 0906  BP: 103/64  Pulse: 92  Resp: 14  Temp: (!) 97.2 F (36.2 C)  TempSrc: Temporal  SpO2: 98%  Weight: 153 lb (69.4 kg)  Height: 5' 9.5" (1.765 m)   Body mass index is 22.27 kg/m.  General: The  patient appears his stated age.   HEENT:  No gross abnormalities Pulmonary: Respirations are non-labored Abdomen: Soft and non-tender  Musculoskeletal: There are no major deformities.   Neurologic: No focal weakness or paresthesias are detected Skin: Multiple papular pink lesions on his arms Psychiatric: The patient has normal affect. Cardiovascular: There is an irregular rhythm with controlled rate.  Left upper arm AVF with palpable pulse, thrill, no audible bruit.  Bilateral radial pulses are 2+ palpable.    Non-Invasive Vascular Imaging  Left Upper Arm AVF Duplex  (Date: 05/23/2019):  Findings: +--------------------+----------+-----------------+--------+ AVF PSV (cm/s)Flow Vol (mL/min)Comments +--------------------+----------+-----------------+--------+ Native artery inflow 70  701   +--------------------+----------+-----------------+--------+ AVF Anastomosis  93    +--------------------+----------+-----------------+--------+  +------------+----------+-------------+----------+-----------------------------+ OUTFLOW VEINPSV (cm/s)Diameter (cm)Depth (cm) Describe  +------------+----------+-------------+----------+-----------------------------+ Prox UA  175  0.41  1.10   +------------+----------+-------------+----------+-----------------------------+ Mid UA  417  0.30  0.11 stenotic and competing branch +------------+----------+-------------+----------+-----------------------------+ Dist UA  122  0.62  0.15   +------------+----------+-------------+----------+-----------------------------+ AC Fossa  448  0.27  0.34  stenotic   +------------+----------+-------------+----------+-----------------------------+  Summary: Arteriovenous fistula-Elevated velocities and a velocity ratio of greater than 3 noted in the mid upper arm. 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 Post-stenotic turbulence noted.   Medical Decision Making  JOSHAWN CRISSMAN is a 78 y.o. male who is s/p second stage left basilic vein transposition on 12/16/2018.  Pt states he had non radiating chest pain and back pain last night, resolved with heating pad to his back. I advised his daughter Hilda Blades (by phone) to call pt's PCP today and let him or her know this.   He takes warfarin, has a history of atrial fib.   Left upper arm AVF duplex today shows stenosis in the antecubital fossa and mid upper arm. Post-stenotic turbulence noted. Dr. Donzetta Matters spoke with pt (and his daughter Hilda Blades by phone), and examined pt.  Will schedule pt for fistula-gram ASAP, soonest available  VVS surgeon, possible intervention.    Clemon Chambers, RN, MSN, FNP-C [facility] Office: [phone]  05/23/2019, 9:20 AM  Clinic MD: Donzetta Matters   Pt now with fistula clotted will place left arm AV graft today  [name], MD [facility] Office: [phone] Pager: [phone]

## 2019-06-23 NOTE — Transfer of Care (Signed)
Immediate Anesthesia Transfer of Care Note  Patient: Don [name]  Procedure(s) Performed: INSERTION OF ARTERIOVENOUS (AV) GORE-TEX GRAFT ARM (Left Arm Upper)  Patient Location: PACU  Anesthesia Type:General  Level of Consciousness: awake, alert  and oriented  Airway & Oxygen Therapy: Patient Spontanous Breathing and Patient connected to face mask oxygen  Post-op Assessment: Report given to RN and Post -op Vital signs reviewed and stable  Post vital signs: Reviewed and stable  Last Vitals:  Vitals Value Taken Time  BP 124/68 06/23/19 1446  Temp    Pulse 88 06/23/19 1447  Resp    SpO2 100 % 06/23/19 1447  Vitals shown include unvalidated device data.  Last Pain:  Vitals:   06/23/19 1046  TempSrc:   PainSc: 0-No pain         Complications: No apparent anesthesia complications

## 2019-06-23 NOTE — Op Note (Signed)
Procedure: Left upper arm AV graft, ligation left upper arm AV fistula  Reoperative diagnosis: End-stage renal disease  Postoperative diagnosis: Same  Anesthesia: General  Assistant: Ellsworth Lennox, RNFA  Operative findings: #1 end-to-end to venous anastomosis 6 mm PTFE  Operative details: After obtaining informed consent, the patient taken the operating room.  The patient placed in supine position operating table.  After induction general anesthesia and placement of a laryngeal mask patient's entire left upper extremities prepped and draped usual sterile fashion.  Longitudinal incision was made near the left antecubital crease.  Incision was carried on through subcutaneous tissues down the level of brachial artery.  There was some scar tissue in this area from previous AV fistula.  The artery was dissected free circumferentially about a centimeter below the previous anastomosis.  Vesseloops were placed around the artery at this location.  Next a longitudinal incision was made in the patient's left axilla.  Incision was carried down through the subcutaneous tissue down to level of a pre-existing AV fistula which was still slightly pulsatile.  This was dissected free circumferentially and the vein followed all the way up into the axilla.  The vein was of good quality although small about 4 mm in diameter.  Next a subcutaneous tunnel was created connecting the antecubital incision to the axillary incision.  A 6 mm PTFE graft was brought through this incision.  The patient was given 7000 units of intravenous heparin.  The artery was controlled proximally and distally with Vesseloops.  A longitudinal pin was made in the brachial artery and the graft beveled and sewn end of graft to side of artery using a running 6-0 Prolene suture.  Despite completion anastomosis it was for blood backbled and thoroughly flushed reanastomosed was secured clamps released there is pulsatile flow in the graft immediately.  Graft was  pulled taut the length in the axilla.  The old fistula was ligated in the axilla and the vein opened longitudinally and spatulated.  The graft was then cut the length spatulated and sewn end-to-end to the axillary vein using a running 6-0 Prolene suture.  Just prior to completion anastomosis it was for blood backbled and thoroughly flushed reanastomosed was secured clamps released palpable thrill in the graft immediately.  Patient had no audible radial or ulnar Doppler signal when the graft was open.  However with clamping of the graft this fully returned and had monophasic to biphasic flow.  Hemostasis was obtained.  The antecubital incision was closed in multiple layers with running 3-0 Vicryl suture in an interrupted simple and vertical mattress nylons in the skin due to the fragility of the skin.  The axillary incision was closed with a running 3-0 Vicryl in the subcutaneous tissues and then a 4-0 Vicryl subcuticular stitch and the skin with Dermabond applied.  The patient tired procedure well and there were no complications.  The instrument sponge needle counts correct in the case.  The patient was taken the recovery in stable condition.  [name], MD [facility] Office: [phone] Pager: [phone]

## 2019-06-23 NOTE — Anesthesia Procedure Notes (Signed)
Procedure Name: LMA Insertion Performed by: Milford Cage, CRNA Pre-anesthesia Checklist: Patient identified, Emergency Drugs available, Suction available and Patient being monitored Patient Re-evaluated:Patient Re-evaluated prior to induction Oxygen Delivery Method: Circle System Utilized Preoxygenation: Pre-oxygenation with 100% oxygen Induction Type: IV induction Ventilation: Mask ventilation without difficulty LMA: LMA inserted LMA Size: 5.0 Number of attempts: 1 Airway Equipment and Method: Bite block Placement Confirmation: positive ETCO2 Tube secured with: Tape Dental Injury: Teeth and Oropharynx as per pre-operative assessment

## 2019-06-23 NOTE — Anesthesia Preprocedure Evaluation (Addendum)
Anesthesia Evaluation  Patient identified by MRN, date of birth, ID band Patient awake    Reviewed: Allergy & Precautions, NPO status , Patient's Chart, lab work & pertinent test results  Airway Mallampati: III  TM Distance: >3 FB Neck ROM: Full    Dental  (+) Edentulous Upper, Edentulous Lower   Pulmonary COPD, former smoker,    Pulmonary exam normal breath sounds clear to auscultation       Cardiovascular hypertension, +CHF  Normal cardiovascular exam+ dysrhythmias Atrial Fibrillation  Rhythm:Regular Rate:Normal     Neuro/Psych PSYCHIATRIC DISORDERS Anxiety Depression Dementia negative neurological ROS     GI/Hepatic negative GI ROS, Neg liver ROS,   Endo/Other  negative endocrine ROS  Renal/GU Dialysis and ESRFRenal disease (TTHSat; 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  Reproductive/Obstetrics                            Anesthesia Physical Anesthesia Plan  ASA: IV  Anesthesia Plan: General   Post-op Pain Management:    Induction: Intravenous  PONV Risk Score and Plan: 2 and Ondansetron and Dexamethasone  Airway Management Planned: LMA  Additional Equipment:   Intra-op Plan:   Post-operative Plan: Extubation in OR  Informed Consent: I have reviewed the patients History and Physical, chart, labs and discussed the procedure including the risks, benefits and alternatives for the proposed anesthesia with the patient or authorized representative who has indicated his/her understanding and acceptance.     Dental advisory given  Plan Discussed with: CRNA  Anesthesia Plan Comments:         Anesthesia Quick Evaluation

## 2019-06-23 NOTE — Progress Notes (Signed)
Called by patient's caregiver at 7:50 PM.  Patient had some oozing from the antecubital incision.  She took down the entire dressing and sent me a picture of the patient's incision.  There was no obvious hematoma or ongoing oozing.  She is going to place a new dressing on this and reinforce this with an Ace wrap.  If he has any further bleeding then he will come to the [facility] emergency room this evening.  [name], MD [facility] [place] Office: [phone] Pager: [phone]

## 2019-06-23 NOTE — Anesthesia Postprocedure Evaluation (Signed)
Anesthesia Post Note  Patient: ANTHANY THORNHILL  Procedure(s) Performed: INSERTION OF ARTERIOVENOUS (AV) GORE-TEX GRAFT ARM (Left Arm Upper)     Patient location during evaluation: PACU Anesthesia Type: General Level of consciousness: awake and alert Pain management: pain level controlled Vital Signs Assessment: post-procedure vital signs reviewed and stable Respiratory status: spontaneous breathing, nonlabored ventilation, respiratory function stable and patient connected to nasal cannula oxygen Cardiovascular status: blood pressure returned to baseline and stable Postop Assessment: no apparent nausea or vomiting Anesthetic complications: no    Last Vitals:  Vitals:   06/23/19 1535 06/23/19 1546  BP: 120/82 123/76  Pulse: 97 92  Resp: 16 17  Temp: 36.8 C 36.7 C  SpO2: 99% 98%    Last Pain:  Vitals:   06/23/19 1535  TempSrc:   PainSc: 0-No pain                 Catalina Gravel

## 2019-06-24 ENCOUNTER — Encounter (HOSPITAL_COMMUNITY): Payer: Self-pay | Admitting: Vascular Surgery

## 2019-06-25 ENCOUNTER — Other Ambulatory Visit: Payer: Self-pay | Admitting: Family Medicine

## 2019-06-25 ENCOUNTER — Telehealth: Payer: Self-pay | Admitting: Vascular Surgery

## 2019-06-25 NOTE — Telephone Encounter (Signed)
Denita from [facility] called wanting wound care orders after patient's recent LUA AVG.    Per Dr. Oneida Alar, Just soap and water with dry dressing.   Denita is aware and verbalized understanding.    Thurston Hole., LPN

## 2019-06-30 ENCOUNTER — Ambulatory Visit (INDEPENDENT_AMBULATORY_CARE_PROVIDER_SITE_OTHER): Payer: Medicare HMO | Admitting: Licensed Clinical Social Worker

## 2019-06-30 DIAGNOSIS — F339 Major depressive disorder, recurrent, unspecified: Secondary | ICD-10-CM

## 2019-06-30 DIAGNOSIS — J441 Chronic obstructive pulmonary disease with (acute) exacerbation: Secondary | ICD-10-CM | POA: Diagnosis not present

## 2019-06-30 DIAGNOSIS — R296 Repeated falls: Secondary | ICD-10-CM

## 2019-06-30 DIAGNOSIS — N186 End stage renal disease: Secondary | ICD-10-CM

## 2019-06-30 DIAGNOSIS — I10 Essential (primary) hypertension: Secondary | ICD-10-CM

## 2019-06-30 DIAGNOSIS — R531 Weakness: Secondary | ICD-10-CM

## 2019-06-30 DIAGNOSIS — R413 Other amnesia: Secondary | ICD-10-CM

## 2019-06-30 NOTE — Patient Instructions (Addendum)
Licensed Clinical Social Worker Visit Information  Client attends scheduled dialysis appointments 3 times weekly as scheduled. Client has transport assistance as needed from family members. Client has prescribed medications and is taking medications as prescribed. Client spoke with LCSW about his challenges related to renewing driver's license. Client said that his family is helping him with his transport needs. LCSW encouraged client or [name] to contact RNCM as needed to discuss nursing needs of client. LCSW encouraged client or [name] to contact LCSW as needed to discuss social work needs of client  Materials Provided: No  Follow Up Plan: LCSW to call client or POA in next 3 weeks to talk with client or POA  about depression management for client  The patient verbalized understanding of instructions provided today and declined a print copy of patient instruction materials.   [name].Ritter Helsley MSW, LCSW Licensed Clinical Social Worker [facility] Care Management [phone]

## 2019-06-30 NOTE — Chronic Care Management (AMB) (Signed)
  Care Management Note   Don [name] is a 78 y.o. year old male who is a primary care patient of Dettinger, Fransisca Kaufmann, MD. The CM team was consulted for assistance with chronic disease management and care coordination.   I reached out to [name] by phone today.   Review of patient status, including review of consultants reports, relevant laboratory and other test results, and collaboration with appropriate care team members and the patient's provider was performed as part of comprehensive patient evaluation and provision of chronic care management services.   Social Determinants of Health: risk social isolation; risk of tobacco use; risk of physical inactivity    Chronic Care Management from 12/23/2018 in [facility]  PHQ-9 Total Score  9     GAD 7 : Generalized Anxiety Score 05/25/2016  Nervous, Anxious, on Edge 1  Control/stop worrying 2  Worry too much - different things 2  Trouble relaxing 1  Restless 0  Easily annoyed or irritable 1  Afraid - awful might happen 0  Total GAD 7 Score 7  Anxiety Difficulty Not difficult at all  Client attends scheduled dialysis appointments 3 times weekly as scheduled. Client has transport assistance as needed from family members. Client has prescribed medications and is taking medications as prescribed. Client spoke with LCSW about his challenges related to renewing driver's license. Client said that his family is helping him with his transport needs. LCSW encouraged client or [name] to contact RNCM as needed to discuss nursing needs of client. LCSW encouraged client or [name] to contact LCSW as needed to discuss social work needs of client  Follow Up Plan: LCSW to call client or POA [name] in next 3 weeks to talk with client or POA about depression management for client  [name].Samona Chihuahua MSW, LCSW Licensed Clinical Social Worker [facility] Care Management [phone]

## 2019-07-01 ENCOUNTER — Telehealth: Payer: Self-pay | Admitting: *Deleted

## 2019-07-01 NOTE — Telephone Encounter (Signed)
Fax received mdINR PT/INR self testing service Test date/time 07/01/19 10:07 am INR 2.0

## 2019-07-01 NOTE — Telephone Encounter (Signed)
Description   Goal INR 2.0-3.0 INR today is 2.0    Continue 4 mg everyday.  Retest in 2 weeks    Caryl Pina, MD [facility] 07/01/2019, 3:36 PM

## 2019-07-02 NOTE — Telephone Encounter (Signed)
[name] aware to continue same dose They check level weekly thought. Call to [facility] to keep dose same as well since they do the bubble packing for patient weekly

## 2019-07-08 ENCOUNTER — Other Ambulatory Visit: Payer: Self-pay | Admitting: Family Medicine

## 2019-07-08 ENCOUNTER — Telehealth: Payer: Self-pay | Admitting: *Deleted

## 2019-07-08 NOTE — Telephone Encounter (Signed)
Goal INR 2.0-3.0 INR today is 2.5    Continue 4 mg everyday.  Retest in 2 weeks

## 2019-07-08 NOTE — Telephone Encounter (Signed)
Fax received mdINR PT/INR self testing service Test date/time 07/08/19 10:16 am INR 2.5

## 2019-07-08 NOTE — Telephone Encounter (Signed)
[name] aware

## 2019-07-11 ENCOUNTER — Other Ambulatory Visit: Payer: Self-pay

## 2019-07-11 ENCOUNTER — Ambulatory Visit (INDEPENDENT_AMBULATORY_CARE_PROVIDER_SITE_OTHER): Payer: Self-pay | Admitting: Physician Assistant

## 2019-07-11 VITALS — BP 102/55 | HR 95 | Temp 97.8°F | Resp 14 | Ht 69.0 in | Wt 156.5 lb

## 2019-07-11 DIAGNOSIS — N186 End stage renal disease: Secondary | ICD-10-CM

## 2019-07-11 NOTE — Progress Notes (Signed)
    Postoperative Access Visit   History of Present Illness   Don [name] is a 78 y.o. year old male who presents for postoperative follow-up for: Ligation of left arm AV fistula and placement of left arm AV graft by Dr. Oneida Alar 06/23/2019.  [name] wounds are  healed.  The patient denies steal symptoms.  The patient is able to complete their activities of daily living.  He is dialyzing on a Tuesday Thursday Saturday schedule via right IJ [facility] under the care of Dr. Lowanda Foster.  Physical Examination   Vitals:   07/11/19 1415  BP: (!) 102/55  Pulse: 95  Resp: 14  Temp: 97.8 F (36.6 C)  TempSrc: Temporal  SpO2: 98%  Weight: 156 lb 8 oz (71 kg)  Height: 5\' 9"  (1.753 m)   Body mass index is 23.11 kg/m.  left arm Incisions healing well, hand grip is 5/5, sensation in digits is intact, palpable thrill, bruit can be auscultated, palpable left radial pulse    Medical Decision Making   Don [name] is a 78 y.o. year old male who presents s/p Ligation of left arm fistula and placement of AV graft   Patent AV graft without signs or symptoms of steal syndrome  [name] access will be ready for use 07/21/19  [name] tunneled dialysis catheter can be removed when Nephrology is comfortable with the performance of the AV graft  The patient may follow up on a prn basis   Dagoberto Ligas PA-C [facility] [place] Office: [phone]  Clinic MD: Dr. Donzetta Matters

## 2019-07-14 ENCOUNTER — Other Ambulatory Visit: Payer: Self-pay | Admitting: Family Medicine

## 2019-07-15 ENCOUNTER — Telehealth: Payer: Self-pay | Admitting: *Deleted

## 2019-07-15 NOTE — Telephone Encounter (Signed)
Fax received mdINR PT/INR self testing service Test date/time 07/15/19 10:13 am INR 3.2

## 2019-07-15 NOTE — Telephone Encounter (Signed)
Goal INR 2.0-3.0 INR today is 3.2   Hold today's dose, then resume current dose of 4 mg every day. Pt needs to retest in 6 days.   Has he had any missed or extra doses.

## 2019-07-15 NOTE — Telephone Encounter (Signed)
Aware. Patient has not missed any or taken any extra dosage.

## 2019-07-18 ENCOUNTER — Ambulatory Visit (INDEPENDENT_AMBULATORY_CARE_PROVIDER_SITE_OTHER): Payer: Medicare HMO | Admitting: Licensed Clinical Social Worker

## 2019-07-18 DIAGNOSIS — F339 Major depressive disorder, recurrent, unspecified: Secondary | ICD-10-CM | POA: Diagnosis not present

## 2019-07-18 DIAGNOSIS — R531 Weakness: Secondary | ICD-10-CM

## 2019-07-18 DIAGNOSIS — I10 Essential (primary) hypertension: Secondary | ICD-10-CM

## 2019-07-18 DIAGNOSIS — R413 Other amnesia: Secondary | ICD-10-CM

## 2019-07-18 DIAGNOSIS — J441 Chronic obstructive pulmonary disease with (acute) exacerbation: Secondary | ICD-10-CM

## 2019-07-18 DIAGNOSIS — N186 End stage renal disease: Secondary | ICD-10-CM | POA: Diagnosis not present

## 2019-07-18 DIAGNOSIS — R296 Repeated falls: Secondary | ICD-10-CM

## 2019-07-18 NOTE — Chronic Care Management (AMB) (Signed)
  Care Management Note   Don [name] is a 78 y.o. year old male who is a primary care patient of Dettinger, Fransisca Kaufmann, MD. The CM team was consulted for assistance with chronic disease management and care coordination.    Don contacted Don [name] via phone today   Review of patient status, including review of consultants reports, relevant laboratory and other test results, and collaboration with appropriate care team members and the patient's provider was performed as part of comprehensive patient evaluation and provision of chronic care management services.   Social Determinants of Health: risk of social isolation; risk of tobacco use; risk of physical inactivity    Chronic Care Management from 12/23/2018 in [facility]  PHQ-9 Total Score  9     GAD 7 : Generalized Anxiety Score 05/25/2016  Nervous, Anxious, on Edge 1  Control/stop worrying 2  Worry too much - different things 2  Trouble relaxing 1  Restless 0  Easily annoyed or irritable 1  Afraid - awful might happen 0  Total GAD 7 Score 7  Anxiety Difficulty Not difficult at all   Medications   New medications from outside sources are available for reconciliation   acetaminophen (TYLENOL) 325 MG tablet    albuterol (PROAIR HFA) 108 (90 Base) MCG/ACT inhaler    atorvastatin (LIPITOR) 20 MG tablet    bisoprolol (ZEBETA) 10 MG tablet    budesonide-formoterol (SYMBICORT) 160-4.5 MCG/ACT inhaler    calcitRIOL (ROCALTROL) 0.25 MCG capsule    clonazePAM (KLONOPIN) 1 MG tablet    cyclobenzaprine (FLEXERIL) 10 MG tablet    diltiazem (CARDIZEM CD) 120 MG 24 hr capsule    ferrous sulfate 325 (65 FE) MG tablet    fluticasone (FLONASE) 50 MCG/ACT nasal spray    hydroxypropyl methylcellulose / hypromellose (ISOPTO TEARS / GONIOVISC) 2.5 % ophthalmic solution    iron polysaccharides (FERREX 150) 150 MG capsule    lidocaine-prilocaine (EMLA) cream    loratadine (CLARITIN) 10 MG tablet    Melatonin 3 MG TABS     Multiple Vitamin (MULTIVITAMIN WITH MINERALS) TABS tablet    Multiple Vitamins-Minerals (PRESERVISION AREDS 2 PO)    multivitamin (RENA-VIT) TABS tablet    nicotine (NICODERM CQ - DOSED IN MG/24 HOURS) 14 mg/24hr patch    Nutritional Supplements (FEEDING SUPPLEMENT, NEPRO CARB STEADY,) LIQD    omega-3 acid ethyl esters (LOVAZA) 1 g capsule    pantoprazole (PROTONIX) 40 MG tablet    terazosin (HYTRIN) 2 MG capsule    terbinafine (LAMISIL) 250 MG tablet    tiotropium (SPIRIVA) 18 MCG inhalation capsule    warfarin (COUMADIN) 2 MG tablet    Don spoke of his weekly dialysis treatments. He is on a Tuesday, Thursday, and Saturday weekly dialysis treatment schedule. He has support from his step-son , Don [name]. He has transport help from his family members. He said he is having difficulty sleeping.  He is eating adequately. He wears glasses to help with vision.  Don said he received a visit from Don [name], Don [name], last evening. Don has encouraged Don to call Don as needed to discuss social work needs of Don.   Follow Up Plan: Don to call Don or POA in next 3 weeks to talk with Don or POA about depression management for Don  Don [name].Don [name], Don [name]/[facility] Care Management [phone]

## 2019-07-18 NOTE — Patient Instructions (Addendum)
Licensed Clinical Social Worker Visit Information  Materials Provided: No  Client spoke of his weekly dialysis treatments. He is on a Tuesday, Thursday, and Saturday weekly dialysis treatment schedule. He has support from his step-son , [place]. He has transport help from his family members. He said he is having difficulty sleeping.  He is eating adequately. He wears glasses to help with vision.  Client said he received a visit from [place], [name], last evening. LCSW has encouraged client to call LCSW as needed to discuss social work needs of client.   Follow Up Plan: LCSW to call client or POA in next 3 weeks to talk with client or POA about depression management for client  The patient verbalized understanding of instructions provided today and declined a print copy of patient instruction materials.   [name].Talon Witting MSW, LCSW Licensed Clinical Social Worker [facility] [phone]

## 2019-07-22 ENCOUNTER — Telehealth: Payer: Self-pay | Admitting: *Deleted

## 2019-07-22 DIAGNOSIS — Z7901 Long term (current) use of anticoagulants: Secondary | ICD-10-CM | POA: Diagnosis not present

## 2019-07-22 DIAGNOSIS — I480 Paroxysmal atrial fibrillation: Secondary | ICD-10-CM | POA: Diagnosis not present

## 2019-07-22 NOTE — Telephone Encounter (Signed)
Fax received mdINR PT/INR self testing service Test date/time 07/22/19 10:13 am INR 2.9

## 2019-07-22 NOTE — Telephone Encounter (Signed)
Continue coumadin 4mg  daily and recheck INR in 1 month

## 2019-07-22 NOTE — Telephone Encounter (Signed)
[employer] as well

## 2019-07-22 NOTE — Telephone Encounter (Signed)
Son aware.

## 2019-07-25 ENCOUNTER — Other Ambulatory Visit (HOSPITAL_COMMUNITY): Payer: Self-pay | Admitting: Nephrology

## 2019-07-25 ENCOUNTER — Telehealth: Payer: Self-pay | Admitting: Family Medicine

## 2019-07-25 DIAGNOSIS — Z992 Dependence on renal dialysis: Secondary | ICD-10-CM

## 2019-07-25 DIAGNOSIS — N186 End stage renal disease: Secondary | ICD-10-CM

## 2019-07-29 ENCOUNTER — Telehealth: Payer: Self-pay | Admitting: *Deleted

## 2019-07-29 NOTE — Telephone Encounter (Signed)
I have written a handwritten prescription, we can go ahead and fax this to them.

## 2019-07-29 NOTE — Telephone Encounter (Signed)
Fax received mdINR PT/INR self testing service Test date/time 07/29/19 10:18 am INR 3.9

## 2019-07-29 NOTE — Telephone Encounter (Signed)
Description   Goal INR 2.0-3.0 INR today is 3.9  Lower dose to take 4mg  daily except on Tuesdays and Saturdays where he will only take 2 mg or 1 tablet instead of 2 Recheck in 2 weeks     I know is not going out for a procedure here soon, we will schedule the recheck for at least a week after the procedure but have him resume this dosing the new dosing but I have just given after the procedure. Caryl Pina, MD [facility] 07/29/2019, 1:16 PM

## 2019-07-29 NOTE — Telephone Encounter (Signed)
LM for pt to call back to discuss this coumadin change.  (note - we also are faxing [facility] today as well for an upcoming HOLD of coumadin prior to a procedure.- they will let him know when to do this hold)

## 2019-07-29 NOTE — Telephone Encounter (Signed)
Don [name] from [facility] called and would like an order for the following:  Pt has a current catheter in chest that they have been using for dialysis - but now has a AV fistula/ arm access.   They would like to remove the cath in chest - all they need is a printed order signed for pt to [facility].  They will need this from Dr Dettinger (rxs meds)   Please fax this order -if approved to [phone].

## 2019-07-29 NOTE — Telephone Encounter (Signed)
Son aware of all changes and orders

## 2019-07-29 NOTE — Telephone Encounter (Signed)
Note faxed to requested #- [phone] 07/29/19.

## 2019-08-05 ENCOUNTER — Telehealth: Payer: Self-pay | Admitting: *Deleted

## 2019-08-05 NOTE — Telephone Encounter (Signed)
Son aware of coumadin dosage

## 2019-08-05 NOTE — Telephone Encounter (Signed)
chenge to coumadin 4mg  daily and recheck in 2 weeks

## 2019-08-05 NOTE — Telephone Encounter (Signed)
Fax received mdINR PT/INR self testing service Test date/time 08/05/19 10:14 am INR 1.1

## 2019-08-06 ENCOUNTER — Encounter (HOSPITAL_COMMUNITY): Payer: Self-pay | Admitting: Physician Assistant

## 2019-08-06 ENCOUNTER — Ambulatory Visit (HOSPITAL_COMMUNITY)
Admission: RE | Admit: 2019-08-06 | Discharge: 2019-08-06 | Disposition: A | Discharge status: Home / Self Care | Payer: Medicare HMO | Source: Ambulatory Visit | Attending: Nephrology | Admitting: Nephrology

## 2019-08-06 ENCOUNTER — Other Ambulatory Visit: Payer: Self-pay

## 2019-08-06 DIAGNOSIS — N186 End stage renal disease: Secondary | ICD-10-CM | POA: Diagnosis not present

## 2019-08-06 DIAGNOSIS — Z452 Encounter for adjustment and management of vascular access device: Secondary | ICD-10-CM | POA: Insufficient documentation

## 2019-08-06 DIAGNOSIS — Z992 Dependence on renal dialysis: Secondary | ICD-10-CM | POA: Insufficient documentation

## 2019-08-06 DIAGNOSIS — Z4901 Encounter for fitting and adjustment of extracorporeal dialysis catheter: Secondary | ICD-10-CM | POA: Diagnosis not present

## 2019-08-06 HISTORY — PX: IR REMOVAL TUN CV CATH W/O FL: IMG2289

## 2019-08-06 MED ORDER — CHLORHEXIDINE GLUCONATE 4 % EX LIQD
CUTANEOUS | Status: AC
  Filled 2019-08-06: qty 15

## 2019-08-06 MED ORDER — LIDOCAINE HCL (PF) 1 % IJ SOLN
INTRAMUSCULAR | Status: AC | PRN
  Administered 2019-08-06: 10 mL

## 2019-08-06 MED ORDER — LIDOCAINE HCL 1 % IJ SOLN
INTRAMUSCULAR | Status: AC
  Filled 2019-08-06: qty 20

## 2019-08-06 NOTE — Procedures (Signed)
Pre procedural Dx: ESRD Post procedural Dx: Same  Successful removal of tunneled HD catheter  EBL: None No immediate complications.  4x4 gauze + tegaderm applied - may remove after 24H  Please see imaging section of Epic for full dictation.  Joaquim Nam PA-C 08/06/2019 9:45 AM

## 2019-08-12 ENCOUNTER — Ambulatory Visit: Payer: Self-pay | Admitting: Family Medicine

## 2019-08-12 ENCOUNTER — Telehealth: Payer: Self-pay | Admitting: *Deleted

## 2019-08-12 LAB — POCT INR: INR: 1.1 — AB (ref 2.0–3.0)

## 2019-08-12 NOTE — Telephone Encounter (Signed)
Goal INR 2.0-3.0 INR today is 1.1  Increase dose to 5 mg on Sunday, Wednesday, and Friday, 4 mg all other days. Repeat INR in 1 week.

## 2019-08-12 NOTE — Telephone Encounter (Signed)
Fax came in today 08/12/19 at 1015 am for pt home INR:  INR 1.1 Range is 2-3  Currently on 4 mg QD x 7 days a week.  Last was 1.1 as well.

## 2019-08-13 ENCOUNTER — Telehealth: Payer: Self-pay

## 2019-08-13 NOTE — Telephone Encounter (Signed)
Son aware.

## 2019-08-19 ENCOUNTER — Telehealth: Payer: Self-pay | Admitting: Family Medicine

## 2019-08-19 DIAGNOSIS — I480 Paroxysmal atrial fibrillation: Secondary | ICD-10-CM | POA: Diagnosis not present

## 2019-08-19 DIAGNOSIS — Z7901 Long term (current) use of anticoagulants: Secondary | ICD-10-CM | POA: Diagnosis not present

## 2019-08-19 NOTE — Telephone Encounter (Signed)
Goal INR 2.0-3.0 INR today is 2.1 (at goal!!)   Continue current dose of  5 mg on Sunday, Wednesday, and Friday, 4 mg all other days. Repeat INR in 1 week.

## 2019-08-19 NOTE — Telephone Encounter (Signed)
Fax received mdINR PT/INR self testing service Test date/time 08/19/2019 10:54 INR 2.1

## 2019-08-19 NOTE — Telephone Encounter (Signed)
lmtcb

## 2019-08-20 NOTE — Telephone Encounter (Signed)
Apt scheduled.  

## 2019-08-20 NOTE — Telephone Encounter (Signed)
Janett Billow can you just put him in the early morning slot and just blocked a later appointment, just double book it in the morning slot so I called him earlier otherwise I will forget and I just go down the line.

## 2019-08-20 NOTE — Telephone Encounter (Signed)
[name] states that patient has been c/o neck and abdominal pain that has been on and off x 2 months.  Would like a tele visit. You have opening tomorrow afternoon but he leaves to go to dialysis at 10 am and will be there most of the afternoon.  If we put him in a late morning appointment can you call patient before 10am ?  Please advise

## 2019-08-20 NOTE — Telephone Encounter (Signed)
[name] aware and verbalizes understanding.

## 2019-08-21 ENCOUNTER — Encounter: Payer: Self-pay | Admitting: Family Medicine

## 2019-08-21 ENCOUNTER — Ambulatory Visit (INDEPENDENT_AMBULATORY_CARE_PROVIDER_SITE_OTHER): Payer: Medicare HMO | Admitting: Family Medicine

## 2019-08-21 DIAGNOSIS — I25110 Atherosclerotic heart disease of native coronary artery with unstable angina pectoris: Secondary | ICD-10-CM | POA: Diagnosis not present

## 2019-08-21 NOTE — Progress Notes (Signed)
Virtual Visit via telephone Note  I connected with Don [name] on 08/21/19 at [facility] by telephone and verified that I am speaking with the correct person using two identifiers. Don [name] is currently located at home and Lonzo Saulter are currently with her during visit. The provider, Fransisca Kaufmann Sharunda Salmon, MD is located in their office at time of visit.  Call ended at 0900  I discussed the limitations, risks, security and privacy concerns of performing an evaluation and management service by telephone and the availability of in person appointments. I also discussed with the patient that there may be a patient responsible charge related to this service. The patient expressed understanding and agreed to proceed.   History and Present Illness: Patient is calling in with chest pain and back pain and neck pain.  The chest pain and back pain came on a few days ago and is gone now and his neck pain has been recurring. The chest pain is recurrent and occasionally happens at dialysis He has none today or in the past couple days. The chest pain is mostly on the left side and sometimes pressure and sometimes sharp.  He has the chest pain can last a few minutes up to an hour. The chest pain leaves him weak and lack of energy.  Patient had his last episode.   No diagnosis found.  Outpatient Encounter Medications as of 08/21/2019  Medication Sig  . acetaminophen (TYLENOL) 325 MG tablet Take 325-650 mg by mouth every 6 (six) hours as needed for moderate pain.   [name] albuterol (PROAIR HFA) 108 (90 Base) MCG/ACT inhaler Inhale 2 puffs into the lungs 2 (two) times daily.   [name] atorvastatin (LIPITOR) 20 MG tablet Take 1 tablet (20 mg total) by mouth daily. (Needs to be seen) (Patient taking differently: Take 20 mg by mouth at bedtime. )  . bisoprolol (ZEBETA) 10 MG tablet TAKE (1) TABLET TWICE A DAY. (Patient taking differently: Take 10 mg by mouth every morning. )  . budesonide-formoterol (SYMBICORT) 160-4.5  MCG/ACT inhaler Inhale 2 puffs into the lungs 2 (two) times daily.  . calcitRIOL (ROCALTROL) 0.25 MCG capsule Take 1 capsule (0.25 mcg total) by mouth every morning.  . clonazePAM (KLONOPIN) 1 MG tablet TAKE 1/2 TABLET AT BEDTIME (Patient taking differently: Take 0.5 mg by mouth at bedtime. )  . cyclobenzaprine (FLEXERIL) 10 MG tablet Take 1 tablet (10 mg total) by mouth 3 (three) times daily as needed for muscle spasms.  [name] diltiazem (CARDIZEM CD) 120 MG 24 hr capsule Take 1 capsule (120 mg total) by mouth daily.  . ferrous sulfate 325 (65 FE) MG tablet Take 325 mg by mouth every Monday, Wednesday, and Friday.  . fluticasone (FLONASE) 50 MCG/ACT nasal spray Place 2 sprays into both nostrils daily. (Patient taking differently: Place 2 sprays into both nostrils daily as needed for allergies. )  . hydroxypropyl methylcellulose / hypromellose (ISOPTO TEARS / GONIOVISC) 2.5 % ophthalmic solution Place 1 drop into both eyes 2 (two) times daily as needed for dry eyes.   . iron polysaccharides (FERREX 150) 150 MG capsule Take 1 capsule (150 mg total) by mouth daily. (Needs to be seen) (Patient taking differently: Take 150 mg by mouth every morning. (Needs to be seen))  . lidocaine-prilocaine (EMLA) cream Apply 1 application topically as needed.  . loratadine (CLARITIN) 10 MG tablet TAKE 1 TABLET DAILY  . Melatonin 3 MG TABS Take 3 mg by mouth at bedtime.   . Multiple Vitamin (  MULTIVITAMIN WITH MINERALS) TABS tablet Take 1 tablet by mouth at bedtime.   . Multiple Vitamins-Minerals (PRESERVISION AREDS 2 PO) Take 1 tablet by mouth 2 (two) times daily.   . multivitamin (RENA-VIT) TABS tablet Take 1 tablet by mouth at bedtime.  . nicotine (NICODERM CQ - DOSED IN MG/24 HOURS) 14 mg/24hr patch Place 1 patch (14 mg total) onto the skin daily.  . Nutritional Supplements (FEEDING SUPPLEMENT, NEPRO CARB STEADY,) LIQD Take 237 mLs by mouth 2 (two) times daily between meals.  [name] omega-3 acid ethyl esters (LOVAZA) 1 g  capsule Take 2 g by mouth 2 (two) times daily.  . pantoprazole (PROTONIX) 40 MG tablet TAKE (1) TABLET TWICE A DAY.  [name] terazosin (HYTRIN) 2 MG capsule TAKE 1 CAPSULE AT BEDTIME  . terbinafine (LAMISIL) 250 MG tablet Take it 3 times weekly on dialysis day after dialysis  . tiotropium (SPIRIVA) 18 MCG inhalation capsule Place 18 mcg into inhaler and inhale daily.   [name] warfarin (COUMADIN) 2 MG tablet TAKE 2 TABLETS (4MG ) DAILY EXCEPT ON MONDAY AND THURS TAKE 2 & 1/2 (5MG )   No facility-administered encounter medications on file as of 08/21/2019.     Review of Systems  Constitutional: Negative for chills and fever.  Eyes: Negative for visual disturbance.  Respiratory: Negative for cough, shortness of breath and wheezing.   Cardiovascular: Positive for chest pain. Negative for leg swelling.  Musculoskeletal: Negative for back pain and gait problem.  Skin: Negative for rash.  Neurological: Negative for dizziness, weakness and light-headedness.  All other systems reviewed and are negative.   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 The patient was provided an opportunity to ask questions and all were answered. The patient agreed with the plan and demonstrated an understanding of the instructions.   The patient was advised to call back or seek an in-person evaluation if the symptoms worsen or if the condition fails to improve as anticipated.  The above assessment and management plan was discussed with the patient. The patient verbalized understanding of and has agreed to the management plan. Patient is aware to  call the clinic if symptoms persist or worsen. Patient is aware when to return to the clinic for a follow-up visit. Patient educated on when it is appropriate to go to the emergency department.    I provided 20 minutes of non-face-to-face time during this encounter.    Worthy Rancher, MD

## 2019-08-26 ENCOUNTER — Telehealth: Payer: Self-pay | Admitting: *Deleted

## 2019-08-26 ENCOUNTER — Other Ambulatory Visit: Payer: Self-pay | Admitting: Family Medicine

## 2019-08-26 NOTE — Telephone Encounter (Signed)
Fax received mdINR PT/INR self testing service Test date/time 08/26/19 10:10 am INR 3.4

## 2019-08-26 NOTE — Telephone Encounter (Signed)
Description   Goal INR 2.0-3.0 INR today is 3.4   Hold today and then decrease current dose of  5 mg on Sunday, Wednesday, 4 mg all other days. Repeat INR in 1 week.      Caryl Pina, MD [facility] 08/26/2019, 11:52 AM

## 2019-08-26 NOTE — Telephone Encounter (Signed)
A message was left, re: his follow up visit. 

## 2019-08-26 NOTE — Telephone Encounter (Signed)
[name] notified and verbalized understanding. Notified [facility] so they could change the dose in his prefilled packs

## 2019-08-28 ENCOUNTER — Ambulatory Visit: Payer: Self-pay | Admitting: Licensed Clinical Social Worker

## 2019-08-28 DIAGNOSIS — N186 End stage renal disease: Secondary | ICD-10-CM

## 2019-08-28 DIAGNOSIS — F339 Major depressive disorder, recurrent, unspecified: Secondary | ICD-10-CM

## 2019-08-28 DIAGNOSIS — J441 Chronic obstructive pulmonary disease with (acute) exacerbation: Secondary | ICD-10-CM

## 2019-08-28 DIAGNOSIS — R531 Weakness: Secondary | ICD-10-CM

## 2019-08-28 DIAGNOSIS — I10 Essential (primary) hypertension: Secondary | ICD-10-CM

## 2019-08-28 DIAGNOSIS — R413 Other amnesia: Secondary | ICD-10-CM

## 2019-08-28 DIAGNOSIS — R296 Repeated falls: Secondary | ICD-10-CM

## 2019-08-28 NOTE — Chronic Care Management (AMB) (Signed)
Care Management Note   Don [name] is a 78 y.o. year old male who is a primary care patient of Dettinger, Fransisca Kaufmann, MD. The CM team was consulted for assistance with chronic disease management and care coordination.   I reached out to [name] by phone today.    Review of patient status, including review of consultants reports, relevant laboratory and other test results, and collaboration with appropriate care team members and the patient's provider was performed as part of comprehensive patient evaluation and provision of chronic care management services.   Social Determinants of Health: risk of social isolation; risk of tobacco use; risk of physical inactivity    Chronic Care Management from 12/23/2018 in [facility]  PHQ-9 Total Score  9     GAD 7 : Generalized Anxiety Score 05/25/2016  Nervous, Anxious, on Edge 1  Control/stop worrying 2  Worry too much - different things 2  Trouble relaxing 1  Restless 0  Easily annoyed or irritable 1  Afraid - awful might happen 0  Total GAD 7 Score 7  Anxiety Difficulty Not difficult at all   Medications   New medications from outside sources are available for reconciliation   acetaminophen (TYLENOL) 325 MG tablet    albuterol (PROAIR HFA) 108 (90 Base) MCG/ACT inhaler    atorvastatin (LIPITOR) 20 MG tablet    bisoprolol (ZEBETA) 10 MG tablet    budesonide-formoterol (SYMBICORT) 160-4.5 MCG/ACT inhaler    calcitRIOL (ROCALTROL) 0.25 MCG capsule    clonazePAM (KLONOPIN) 1 MG tablet    cyclobenzaprine (FLEXERIL) 10 MG tablet    diltiazem (CARDIZEM CD) 120 MG 24 hr capsule    ferrous sulfate 325 (65 FE) MG tablet    fluticasone (FLONASE) 50 MCG/ACT nasal spray    hydroxypropyl methylcellulose / hypromellose (ISOPTO TEARS / GONIOVISC) 2.5 % ophthalmic solution    iron polysaccharides (FERREX 150) 150 MG capsule    lidocaine-prilocaine (EMLA) cream    loratadine (CLARITIN) 10 MG tablet    Melatonin 3 MG  TABS    Multiple Vitamin (MULTIVITAMIN WITH MINERALS) TABS tablet    Multiple Vitamins-Minerals (PRESERVISION AREDS 2 PO)    multivitamin (RENA-VIT) TABS tablet    Nutritional Supplements (FEEDING SUPPLEMENT, NEPRO CARB STEADY,) LIQD    omega-3 acid ethyl esters (LOVAZA) 1 g capsule    pantoprazole (PROTONIX) 40 MG tablet    sevelamer carbonate (RENVELA) 800 MG tablet    terbinafine (LAMISIL) 250 MG tablet    tiotropium (SPIRIVA) 18 MCG inhalation capsule    warfarin (COUMADIN) 2 MG tablet    Goals    . "I get sad sometimes because I live alone" (pt-stated)     Current Barriers:   Client has transportation needs . Client has some ambulation challenges (uses cane to help with ambulation) . Social isolation issues  Clinical Social Work Clinical Goal(s): Over the next 30 days, client will verbalize understanding of depression symptoms management.  Interventions: . Previously discussed with client self care activities of choice . Previously encouraged client to participate in recreational/relaxation activities of choice  . Previously encouraged client or POA to call LCSW as needed to discuss management of depression symptoms of client . Encouraged client previously to use relaxation techniques (watching TV,talking via phone with [name] ([place]) . Encouraged client previously to attend scheduled client medical appointments  Patient Self Care Activities:  . Client socializes with family weekly as scheduled.  . Client contacts [name], step-son, POA, as needed for daily  needs/assistance of client. . Client takes medications as prescribed.  Plan:  . LCSW to contact client/POA [name]) in three weeks to discuss client needs related to depression management.  [name] [facility]) to contact LCSW related to psychosocial needs of client . [name] Client to use relaxation techniques of choice to help him manage depression symptoms.  . Client to attend scheduled medical appointments  . Client or POA [name] to communicate with RN CM as needed to discuss nursing needs of client . Client to practice self care activities (eating meals on normal scheduled times, getting enough rest at night)              Client and LCSW have spoken of client's weekly dialysis treatments. He is on a Tuesday, Thursday, and Saturday weekly dialysis treatment schedule. He has support from his step-son , [place]. He has transport help from his family members.LCSW has encouraged client to call LCSW as needed to discuss social work needs of client.   Follow Up Plan: LCSW to call client/[name] [place], [place] in next 3 weeks to talk with client or [name] about client needs related to depression management  [name].Irvin Lizama MSW, LCSW Licensed Clinical Social Worker [facility] Care Management [phone]

## 2019-08-28 NOTE — Patient Instructions (Signed)
Licensed Clinical Social Worker Visit Information  Goals we discussed today:  Goals        . "I get sad sometimes because I live alone" (pt-stated)     Current Barriers:   Client has transportation needs . Client has some ambulation challenges (uses cane to help with ambulation) . Social isolation issues  Clinical Social Work Clinical Goal(s): Over the next 30 days, client will verbalize understanding of depression symptoms management.  Interventions: . Previously discussed with client self care activities of choice for client . Encouraged client previously to participate in recreational/relaxation activities of choice  . Previously encouraged client or POA to call LCSW as needed to discuss management of depression symptoms of client . Encouraged client previously to use relaxation techniques (watching TV,talking via phone with [name] ([place]) . Encouraged client previously to attend scheduled client medical appointments  Patient Self Care Activities:  . Client socializes with family weekly as scheduled.  . Client contacts [name], step-son, POA, as needed for daily needs/assistance of client. . Client takes medications as prescribed.  Plan:  . LCSW to contact client/POA [name]) in three weeks to discuss client needs related to depression management.  [name] [facility]) to contact LCSW related to psychosocial needs of client . [name] Client to use relaxation techniques of choice to help him manage depression symptoms.  . Client to attend scheduled medical appointments . Client or POA [name] to communicate with RN CM as needed to discuss nursing needs of client . Client to practice self care activities (eating meals on normal scheduled times, getting enough rest at night)          Materials Provided:  No  Follow Up Plan: LCSW to call client/POA in next 3 weeks to talk about client needs regarding depression management for client  The patient verbalized  understanding of instructions provided today and declined a print copy of patient instruction materials.   [name].Tameyah Koch MSW, LCSW Licensed Clinical Social Worker [facility] Care Management [phone]

## 2019-09-01 ENCOUNTER — Ambulatory Visit (INDEPENDENT_AMBULATORY_CARE_PROVIDER_SITE_OTHER): Payer: Medicare HMO | Admitting: Licensed Clinical Social Worker

## 2019-09-01 DIAGNOSIS — F339 Major depressive disorder, recurrent, unspecified: Secondary | ICD-10-CM | POA: Diagnosis not present

## 2019-09-01 DIAGNOSIS — J441 Chronic obstructive pulmonary disease with (acute) exacerbation: Secondary | ICD-10-CM | POA: Diagnosis not present

## 2019-09-01 DIAGNOSIS — R531 Weakness: Secondary | ICD-10-CM

## 2019-09-01 DIAGNOSIS — I10 Essential (primary) hypertension: Secondary | ICD-10-CM

## 2019-09-01 DIAGNOSIS — R296 Repeated falls: Secondary | ICD-10-CM

## 2019-09-01 DIAGNOSIS — N186 End stage renal disease: Secondary | ICD-10-CM

## 2019-09-01 DIAGNOSIS — R413 Other amnesia: Secondary | ICD-10-CM

## 2019-09-01 NOTE — Chronic Care Management (AMB) (Signed)
Care Management Note   Don [name] is a 78 y.o. year old male who is a primary care patient of Dettinger, Fransisca Kaufmann, MD. The CM team was consulted for assistance with chronic disease management and care coordination.   I reached out to [name] by phone today   Review of patient status, including review of consultants reports, relevant laboratory and other test results, and collaboration with appropriate care team members and the patient's provider was performed as part of comprehensive patient evaluation and provision of chronic care management services.   Social Determinants of Health: risk of social isolation; risk of physical inactivity; risk of tobacco use    Chronic Care Management from 12/23/2018 in [facility]  PHQ-9 Total Score  9     GAD 7 : Generalized Anxiety Score 05/25/2016  Nervous, Anxious, on Edge 1  Control/stop worrying 2  Worry too much - different things 2  Trouble relaxing 1  Restless 0  Easily annoyed or irritable 1  Afraid - awful might happen 0  Total GAD 7 Score 7  Anxiety Difficulty Not difficult at all   Medications   New medications from outside sources are available for reconciliation   acetaminophen (TYLENOL) 325 MG tablet    albuterol (PROAIR HFA) 108 (90 Base) MCG/ACT inhaler    atorvastatin (LIPITOR) 20 MG tablet    bisoprolol (ZEBETA) 10 MG tablet    budesonide-formoterol (SYMBICORT) 160-4.5 MCG/ACT inhaler    calcitRIOL (ROCALTROL) 0.25 MCG capsule    clonazePAM (KLONOPIN) 1 MG tablet    cyclobenzaprine (FLEXERIL) 10 MG tablet    diltiazem (CARDIZEM CD) 120 MG 24 hr capsule    ferrous sulfate 325 (65 FE) MG tablet    fluticasone (FLONASE) 50 MCG/ACT nasal spray    hydroxypropyl methylcellulose / hypromellose (ISOPTO TEARS / GONIOVISC) 2.5 % ophthalmic solution    iron polysaccharides (FERREX 150) 150 MG capsule    lidocaine-prilocaine (EMLA) cream    loratadine (CLARITIN) 10 MG tablet    Melatonin 3 MG TABS     Multiple Vitamin (MULTIVITAMIN WITH MINERALS) TABS tablet    Multiple Vitamins-Minerals (PRESERVISION AREDS 2 PO)    multivitamin (RENA-VIT) TABS tablet    Nutritional Supplements (FEEDING SUPPLEMENT, NEPRO CARB STEADY,) LIQD    omega-3 acid ethyl esters (LOVAZA) 1 g capsule    pantoprazole (PROTONIX) 40 MG tablet    sevelamer carbonate (RENVELA) 800 MG tablet    terbinafine (LAMISIL) 250 MG tablet    tiotropium (SPIRIVA) 18 MCG inhalation capsule    warfarin (COUMADIN) 2 MG tablet    Goals Addressed            This Visit's Progress   . "I get sad sometimes because I live alone" (pt-stated)       Current Barriers:   Client has transportation needs . Client has some ambulation challenges (uses cane to help with ambulation) . Social isolation issues  Clinical Social Work Clinical Goal(s): Over the next 30 days, client will verbalize understanding of depression symptoms management.  Interventions: . Previously discussed self care activities: sleep hygiene, basic healthy eating practices . Encouraged client previously to participate in recreational/relaxation activities of choice  . Previously encouraged client or POA to call LCSW as needed to discuss management of depression symptoms of client . Encouraged client previously to use relaxation techniques (watching TV,talking via phone with [name] ([name] . Talked with client about current social work needs of client . Collaborated with RNCM to discuss dietary  needs of client  Patient Self Care Activities:  . Client socializes with family weekly as scheduled.  . Client contacts [name], step-son, POA, as needed for daily needs/assistance of client. . Client takes medications as prescribed.  Plan:  . LCSW to contact client/POA [name]) in three weeks to discuss client needs related to depression management.  [name] [facility]) to contact LCSW related to psychosocial needs of client . [name] Client to use relaxation  techniques of choice to help him manage depression symptoms.  . Client to attend scheduled medical appointments . Client or POA [name] to communicate with RN CM as needed to discuss nursing needs of client . Client to practice self care activities (eating meals on normal scheduled times, getting enough rest at night)          Follow Up Plan: LCSW to call client/[name] in next 3 weeks to discuss client needs related to depression management  [name].Ciella Obi MSW, LCSW Licensed Clinical Social Worker [facility] Care Management [phone]

## 2019-09-01 NOTE — Patient Instructions (Signed)
Licensed Clinical Social Worker Visit Information  Goals we discussed today:  Goals Addressed            This Visit's Progress   . "I get sad sometimes because I live alone" (pt-stated)       Current Barriers:   Client has transportation needs . Client has some ambulation challenges (uses cane to help with ambulation) . Social isolation issues  Clinical Social Work Clinical Goal(s): Over the next 30 days, client will verbalize understanding of depression symptoms management.  Interventions: . Previously discussed self care activities: sleep hygiene, basic healthy eating practices . Encouraged client previously to participate in recreational/relaxation activities of choice  . Previously encouraged client or POA to call LCSW as needed to discuss management of depression symptoms of client . Encouraged client previously to use relaxation techniques (watching TV,talking via phone with [name] ([place]) . Talked with client about current social work needs of client . Collaborated with RNCM to discuss dietary needs of client  Patient Self Care Activities:  . Client socializes with family weekly as scheduled.  . Client contacts [name], step-son, POA, as needed for daily needs/assistance of client. . Client takes medications as prescribed.  Plan:  . LCSW to contact client/POA [name]) in three weeks to discuss client needs related to depression management.  [name] [facility]) to contact LCSW related to psychosocial needs of client . [name] Client to use relaxation techniques of choice to help him manage depression symptoms.  . Client to attend scheduled medical appointments . Client or POA [name] to communicate with RN CM as needed to discuss nursing needs of client . Client to practice self care activities (eating meals on normal scheduled times, getting enough rest at night)            Materials Provided: No  Follow Up Plan: LCSW to contact client/POA [name] in next 3 weeks to discuss client needs related to depression management  The patient verbalized understanding of instructions provided today and declined a print copy of patient instruction materials.   [name].Melicia Esqueda MSW, LCSW Licensed Clinical Social Worker [facility] Care [facility] [phone]

## 2019-09-02 ENCOUNTER — Telehealth: Payer: Self-pay | Admitting: *Deleted

## 2019-09-02 ENCOUNTER — Other Ambulatory Visit: Payer: Self-pay | Admitting: Family Medicine

## 2019-09-02 ENCOUNTER — Ambulatory Visit: Payer: Medicare HMO | Admitting: *Deleted

## 2019-09-02 NOTE — Telephone Encounter (Signed)
Mail box full can not leave message

## 2019-09-02 NOTE — Chronic Care Management (AMB) (Signed)
  Chronic Care Management   Follow-up Note  09/02/2019 Name: Don [name] MRN: [id number] DOB: Feb 26, 1941  An unsuccessful follow-up attempt was made today. Don [name] has been talking with Legrand Como "Scott" Forrest, LCSW with the [facility] CCM Team regarding his psychosocial needs and Nicki Reaper recommended that I talk with Don [name] about his diet and meal planning. Don [name] expressed some concern about nutrition during their most recent call.    Follow up plan: A HIPPA compliant phone message was left for the patient providing contact information and requesting a return call.  The care management team will reach out to the patient again over the next 7 days.    [name], BSN, RN-BC Embedded Chronic Care Manager [facility] Direct Dial: [phone]

## 2019-09-02 NOTE — Telephone Encounter (Signed)
Goal INR 2.0-3.0 INR today is 3.1   Hold today dose and then decrease current dose of 5 mg to  Wednesday, 4 mg all other days. Repeat INR in 1 week.

## 2019-09-02 NOTE — Telephone Encounter (Signed)
Fax received mdINR PT/INR self testing service Test date/time 09/02/19 10:45 am INR 3.1

## 2019-09-02 NOTE — Telephone Encounter (Signed)
Patient son states this is what father done last week as far as he can remember?

## 2019-09-05 ENCOUNTER — Telehealth: Payer: Self-pay | Admitting: Genetic Counselor

## 2019-09-05 NOTE — Telephone Encounter (Signed)
A genetic counseling appt has been cld and scheduled to see Don [name] on 11/2 at [place]. Mr. Fullen has been made aware to arrive 15 minutes early.

## 2019-09-09 ENCOUNTER — Other Ambulatory Visit: Payer: Self-pay

## 2019-09-09 ENCOUNTER — Encounter (HOSPITAL_COMMUNITY): Payer: Self-pay

## 2019-09-09 ENCOUNTER — Telehealth: Payer: Self-pay | Admitting: *Deleted

## 2019-09-09 ENCOUNTER — Emergency Department (HOSPITAL_COMMUNITY)
Admission: EM | Admit: 2019-09-09 | Discharge: 2019-09-11 | Disposition: A | Discharge status: Home / Self Care | Payer: Medicare HMO | Attending: Emergency Medicine | Admitting: Emergency Medicine

## 2019-09-09 DIAGNOSIS — Z8546 Personal history of malignant neoplasm of prostate: Secondary | ICD-10-CM | POA: Insufficient documentation

## 2019-09-09 DIAGNOSIS — J449 Chronic obstructive pulmonary disease, unspecified: Secondary | ICD-10-CM | POA: Insufficient documentation

## 2019-09-09 DIAGNOSIS — F329 Major depressive disorder, single episode, unspecified: Secondary | ICD-10-CM | POA: Insufficient documentation

## 2019-09-09 DIAGNOSIS — Z20828 Contact with and (suspected) exposure to other viral communicable diseases: Secondary | ICD-10-CM | POA: Insufficient documentation

## 2019-09-09 DIAGNOSIS — N186 End stage renal disease: Secondary | ICD-10-CM | POA: Diagnosis not present

## 2019-09-09 DIAGNOSIS — Z87891 Personal history of nicotine dependence: Secondary | ICD-10-CM | POA: Insufficient documentation

## 2019-09-09 DIAGNOSIS — Z79899 Other long term (current) drug therapy: Secondary | ICD-10-CM | POA: Diagnosis not present

## 2019-09-09 DIAGNOSIS — Z046 Encounter for general psychiatric examination, requested by authority: Secondary | ICD-10-CM | POA: Diagnosis present

## 2019-09-09 DIAGNOSIS — J90 Pleural effusion, not elsewhere classified: Secondary | ICD-10-CM | POA: Diagnosis not present

## 2019-09-09 DIAGNOSIS — F0390 Unspecified dementia without behavioral disturbance: Secondary | ICD-10-CM | POA: Insufficient documentation

## 2019-09-09 DIAGNOSIS — I4891 Unspecified atrial fibrillation: Secondary | ICD-10-CM | POA: Diagnosis not present

## 2019-09-09 DIAGNOSIS — R45851 Suicidal ideations: Secondary | ICD-10-CM

## 2019-09-09 DIAGNOSIS — Z7901 Long term (current) use of anticoagulants: Secondary | ICD-10-CM | POA: Diagnosis not present

## 2019-09-09 DIAGNOSIS — Z992 Dependence on renal dialysis: Secondary | ICD-10-CM | POA: Insufficient documentation

## 2019-09-09 DIAGNOSIS — I120 Hypertensive chronic kidney disease with stage 5 chronic kidney disease or end stage renal disease: Secondary | ICD-10-CM | POA: Insufficient documentation

## 2019-09-09 DIAGNOSIS — R5381 Other malaise: Secondary | ICD-10-CM

## 2019-09-09 DIAGNOSIS — Z03818 Encounter for observation for suspected exposure to other biological agents ruled out: Secondary | ICD-10-CM | POA: Diagnosis not present

## 2019-09-09 DIAGNOSIS — R911 Solitary pulmonary nodule: Secondary | ICD-10-CM | POA: Diagnosis not present

## 2019-09-09 DIAGNOSIS — R918 Other nonspecific abnormal finding of lung field: Secondary | ICD-10-CM

## 2019-09-09 LAB — COMPREHENSIVE METABOLIC PANEL
ALT: 12 U/L (ref 0–44)
AST: 14 U/L — ABNORMAL LOW (ref 15–41)
Albumin: 2.7 g/dL — ABNORMAL LOW (ref 3.5–5.0)
Alkaline Phosphatase: 122 U/L (ref 38–126)
Anion gap: 14 (ref 5–15)
BUN: 34 mg/dL — ABNORMAL HIGH (ref 8–23)
CO2: 29 mmol/L (ref 22–32)
Calcium: 8.6 mg/dL — ABNORMAL LOW (ref 8.9–10.3)
Chloride: 96 mmol/L — ABNORMAL LOW (ref 98–111)
Creatinine, Ser: 4.99 mg/dL — ABNORMAL HIGH (ref 0.61–1.24)
GFR calc Af Amer: 12 mL/min — ABNORMAL LOW (ref >60)
GFR calc non Af Amer: 10 mL/min — ABNORMAL LOW (ref >60)
Glucose, Bld: 158 mg/dL — ABNORMAL HIGH (ref 70–99)
Potassium: 4 mmol/L (ref 3.5–5.1)
Sodium: 139 mmol/L (ref 135–145)
Total Bilirubin: 0.6 mg/dL (ref 0.3–1.2)
Total Protein: 6.3 g/dL — ABNORMAL LOW (ref 6.5–8.1)

## 2019-09-09 LAB — CBC
HCT: 32.9 % — ABNORMAL LOW (ref 39.0–52.0)
Hemoglobin: 10.1 g/dL — ABNORMAL LOW (ref 13.0–17.0)
MCH: 32 pg (ref 26.0–34.0)
MCHC: 30.7 g/dL (ref 30.0–36.0)
MCV: 104.1 fL — ABNORMAL HIGH (ref 80.0–100.0)
Platelets: 268 10*3/uL (ref 150–400)
RBC: 3.16 MIL/uL — ABNORMAL LOW (ref 4.22–5.81)
RDW: 14.9 % (ref 11.5–15.5)
WBC: 8.4 10*3/uL (ref 4.0–10.5)
nRBC: 0 % (ref 0.0–0.2)

## 2019-09-09 LAB — ACETAMINOPHEN LEVEL: Acetaminophen (Tylenol), Serum: <10 ug/mL — ABNORMAL LOW (ref 10–30)

## 2019-09-09 LAB — ETHANOL: Alcohol, Ethyl (B): <10 mg/dL (ref <10)

## 2019-09-09 LAB — SALICYLATE LEVEL: Salicylate Lvl: <7 mg/dL (ref 2.8–30.0)

## 2019-09-09 NOTE — Telephone Encounter (Signed)
Aware. 

## 2019-09-09 NOTE — ED Triage Notes (Addendum)
Pt brought to ED via [facility] Dept for SI. Pt under voluntary commitment. Pt states "I am tired and burnt out. I am going to get me a piece of water hose and cut it and tape it to the muffler of the car and run it in the window and crank the car." Pt states he has had too much on his mind. Pt states he gets dialysis and states he doesn't feel like living anymore. Pt states his step son [name] is his POA but states "You watch when he comes in here he will make me feel guilty, he has my mind so tore up and he has all my money, I don't have any money, I can't drive."

## 2019-09-09 NOTE — Telephone Encounter (Signed)
Goal INR 2.0-3.0 INR today is 3.1  Still slightly thin Current: 5 mg to  Wednesday, 4 mg all other days. Repeat INR in 1 week.  Reduce to 4 mg daily and recheck 1 week  Don Sleeper PA-C [place] [address]  [place], [place] [address] [phone]

## 2019-09-09 NOTE — ED Provider Notes (Signed)
[facility] EMERGENCY DEPARTMENT Provider Note   CSN: [id number] Arrival date & time: 09/09/19  2043     History   Chief Complaint Chief Complaint  Patient presents with  . Don [name]    HPI Don [name] is a 78 y.o. male.     Patient is a an 78 year old male brought by law enforcement for evaluation of suicidal ideation.  Patient made threats of harming himself by carbon monoxide poisoning to the triage nurse.  Apparently he has issues with family members and finances and tells me that he is "tired of living".  The history is provided by the patient.    Past Medical History:  Diagnosis Date  . Acute bronchitis 04/03/2014  . Anemia    low iron  . Anxiety   . Atrial fibrillation ([place])   . Cataract   . COPD (chronic obstructive pulmonary disease) ([place])   . Dementia ([place])   . Depression   . Essential hypertension   . Hyperlipidemia   . Macular degeneration   . Noncompliance with medications 04/2013   Xarelto, digoxin previously  . Pre-diabetes   . Prostate cancer ([place])   . 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 Tubular adenoma of colon 07/31/02, 11/18/03    Patient Active Problem List   Diagnosis Date Noted  . ESRD (end stage renal disease) ([place]) 10/30/2018  . COPD with acute exacerbation ([place]) 10/30/2018  . Acute on chronic diastolic CHF (congestive heart failure) ([place]) 10/30/2018  . Hemodialysis patient ([place])   . Acute renal failure superimposed on stage 5 chronic kidney disease, not on chronic dialysis ([place])   . Avulsion of skin of right forearm   . Fall   . Palliative care by specialist   . DNR (do not resuscitate) discussion   . Atrial fibrillation with RVR ([place]) 03/18/2018  . Mass of lower lobe of left lung 02/12/2018  . Depression, recurrent ([place]) 01/02/2018  . Acute on chronic anemia   . Normocytic anemia   . Coagulopathy ([place]) 01/31/2017  . Pancytopenia ([place]) 01/31/2017  . Pre-diabetes 07/23/2015  . Metabolic  syndrome 62/69/4854  . Urinary bladder incontinence 03/04/2015  . Hyperlipidemia LDL goal <130 04/04/2014  . Personal history of colonic polyps 04/04/2014  . Heme positive stool 04/03/2014  . History of prostate cancer 04/03/2014  . Tobacco abuse 04/25/2013  . Prostate cancer ([place]) 02/08/2011  . COPD (chronic obstructive pulmonary disease) ([place]) 02/08/2011  . Hypertension 02/08/2011    Past Surgical History:  Procedure Laterality Date  . A/V FISTULAGRAM Left 05/30/2019   Procedure: A/V FISTULAGRAM;  Surgeon: Elam Dutch, MD;  Location: [place] [facility];  Service: Cardiovascular;  Laterality: Left;  . Anal abcess,Hemorroids,    . AV FISTULA PLACEMENT Left 06/23/2019   Procedure: INSERTION OF ARTERIOVENOUS (AV) GORE-TEX GRAFT ARM;  Surgeon: Elam Dutch, MD;  Location: [place];  Service: Vascular;  Laterality: Left;  . BASCILIC VEIN TRANSPOSITION Left 10/08/2018   Procedure: FIRST STAGE BASILIC VEIN TRANSPOSITION LEFT ARM;  Surgeon: Angelia Mould, MD;  Location: [place];  Service: Vascular;  Laterality: Left;  . BASCILIC VEIN TRANSPOSITION Left 12/16/2018   Procedure: BASILIC VEIN TRANSPOSITION SECOND STAGE;  Surgeon: Angelia Mould, MD;  Location: [facility] OR;  Service: Vascular;  Laterality: Left;  . COLONOSCOPY N/A 04/05/2014   Dr. Fuller Plan: 6 mm sessile polyp from sigmoid colon (tubular adenoma), internal hemorrhoids  . COLONOSCOPY W/ BIOPSIES  11/18/2003   Dr. Hassell Done  Johnson  . ESOPHAGOGASTRODUODENOSCOPY  11/18/2003   Dr. Earle Gell  . ESOPHAGOGASTRODUODENOSCOPY N/A 04/05/2014   Dr. Fuller Plan: variable Z line, negative Barrett's, small hiatal hernia  . ESOPHAGOGASTRODUODENOSCOPY N/A 02/02/2017   Dr. Oneida Alar: LA Grade B esophagitis, one moderate benign-appearing intrinsic stenosis traversed, small non-bleeding diverticulum in second portion of duodenum, reactive gastropathy, no H.pylori.  . ESOPHAGOGASTRODUODENOSCOPY N/A 03/22/2018   Procedure: ESOPHAGOGASTRODUODENOSCOPY  (EGD);  Surgeon: Daneil Dolin, MD;  [facility];  Service: Endoscopy;  Laterality: N/A;  . GIVENS CAPSULE STUDY N/A 03/22/2018   Procedure: GIVENS CAPSULE STUDY;  Surgeon: Daneil Dolin, MD;  [facility];  Service: Endoscopy;  Laterality: N/A;  . IR FLUORO GUIDE CV LINE RIGHT  10/29/2018  . IR REMOVAL TUN CV CATH W/O FL  08/06/2019  . IR US GUIDE VASC ACCESS RIGHT  10/29/2018  . PERIPHERAL VASCULAR BALLOON ANGIOPLASTY Left 03/14/2019   Procedure: PERIPHERAL VASCULAR BALLOON ANGIOPLASTY;  Surgeon: Angelia Mould, MD;  Location: [place] [facility];  Service: Cardiovascular;  Laterality: Left;  . PERIPHERAL VASCULAR BALLOON ANGIOPLASTY  05/30/2019   Procedure: PERIPHERAL VASCULAR BALLOON ANGIOPLASTY;  Surgeon: Elam Dutch, MD;  Location: [place] [facility];  Service: Cardiovascular;;  left arm fistula  . RETROPUBIC PROSTATECTOMY  11/26/2001  . TONSILLECTOMY          Home Medications    Prior to Admission medications   Medication Sig Start Date End Date Taking? Authorizing Provider  acetaminophen (TYLENOL) 325 MG tablet Take 325-650 mg by mouth every 6 (six) hours as needed for moderate pain.     [provider]  albuterol (PROAIR HFA) 108 (90 Base) MCG/ACT inhaler Inhale 2 puffs into the lungs 2 (two) times daily.     [provider]  atorvastatin (LIPITOR) 20 MG tablet Take 1 tablet (20 mg total) by mouth daily. (Needs to be seen) Patient taking differently: Take 20 mg by mouth at bedtime.  08/16/18   Dettinger, Fransisca Kaufmann, MD  bisoprolol (ZEBETA) 10 MG tablet TAKE (1) TABLET TWICE A DAY. Patient taking differently: Take 10 mg by mouth every morning.  10/18/18   Dettinger, Fransisca Kaufmann, MD  budesonide-formoterol (SYMBICORT) 160-4.5 MCG/ACT inhaler Inhale 2 puffs into the lungs 2 (two) times daily.    [provider]  calcitRIOL (ROCALTROL) 0.25 MCG capsule Take 1 capsule (0.25 mcg total) by mouth every morning. 11/18/18   Dettinger,  Fransisca Kaufmann, MD  clonazePAM (KLONOPIN) 1 MG tablet TAKE 1/2 TABLET AT BEDTIME Patient taking differently: Take 0.5 mg by mouth at bedtime.  04/30/19   Dettinger, Fransisca Kaufmann, MD  cyclobenzaprine (FLEXERIL) 10 MG tablet Take 1 tablet (10 mg total) by mouth 3 (three) times daily as needed for muscle spasms. 03/26/19   Dettinger, Fransisca Kaufmann, MD  diltiazem (CARDIZEM CD) 120 MG 24 hr capsule Take 1 capsule (120 mg total) by mouth daily. 04/08/19   Dettinger, Fransisca Kaufmann, MD  ferrous sulfate 325 (65 FE) MG tablet Take 325 mg by mouth every Monday, Wednesday, and Friday.    [provider]  fluticasone (FLONASE) 50 MCG/ACT nasal spray Place 2 sprays into both nostrils daily. Patient taking differently: Place 2 sprays into both nostrils daily as needed for allergies.  11/25/18   Janora Norlander, DO  hydroxypropyl methylcellulose / hypromellose (ISOPTO TEARS / GONIOVISC) 2.5 % ophthalmic solution Place 1 drop into both eyes 2 (two) times daily as needed for dry eyes.     [provider]  iron  polysaccharides (FERREX 150) 150 MG capsule Take 1 capsule (150 mg total) by mouth daily. (Needs to be seen) Patient taking differently: Take 150 mg by mouth every morning. (Needs to be seen) 08/16/18   Dettinger, Fransisca Kaufmann, MD  lidocaine-prilocaine (EMLA) cream Apply 1 application topically as needed. 06/03/19   Dettinger, Fransisca Kaufmann, MD  loratadine (CLARITIN) 10 MG tablet TAKE 1 TABLET DAILY 07/09/19   Dettinger, Fransisca Kaufmann, MD  Melatonin 3 MG TABS Take 3 mg by mouth at bedtime.     [provider]  Multiple Vitamin (MULTIVITAMIN WITH MINERALS) TABS tablet Take 1 tablet by mouth at bedtime.     [provider]  Multiple Vitamins-Minerals (PRESERVISION AREDS 2 PO) Take 1 tablet by mouth 2 (two) times daily.     [provider]  multivitamin (RENA-VIT) TABS tablet Take 1 tablet by mouth at bedtime. 10/31/18   Orson Eva, MD  Nutritional Supplements (FEEDING SUPPLEMENT, NEPRO CARB STEADY,) LIQD  Take 237 mLs by mouth 2 (two) times daily between meals. 10/31/18   Orson Eva, MD  omega-3 acid ethyl esters (LOVAZA) 1 g capsule Take 2 g by mouth 2 (two) times daily.    [provider]  pantoprazole (PROTONIX) 40 MG tablet TAKE (1) TABLET TWICE A DAY. 06/25/19   Dettinger, Fransisca Kaufmann, MD  sevelamer carbonate (RENVELA) 800 MG tablet Take 800 mg by mouth 3 (three) times daily with meals.    [provider]  terbinafine (LAMISIL) 250 MG tablet Take it 3 times weekly on dialysis day after dialysis 04/08/19   Dettinger, Fransisca Kaufmann, MD  tiotropium (SPIRIVA) 18 MCG inhalation capsule Place 18 mcg into inhaler and inhale daily.     [provider]  warfarin (COUMADIN) 2 MG tablet TAKE 2 TABLETS (4MG ) DAILY EXCEPT ON MONDAY AND THURS TAKE 2 & 1/2 (5MG ) 08/26/19   Dettinger, Fransisca Kaufmann, MD    Family History Family History  Problem Relation Age of Onset  . Diabetes Father   . Heart disease Father 109       MI  . Heart attack Father   . Heart attack Mother   . Heart disease Brother   . Cancer Brother        lung  . Lung cancer Brother   . Heart disease Brother   . Lung cancer Sister   . Cancer Sister        lung  . Heart disease Brother   . Heart disease Brother     Social History Social History   Tobacco Use  . Smoking status: Former Smoker    Packs/day: 0.50    Years: 62.00    Pack years: 31.00    Types: Cigarettes, Cigars    Quit date: 10/27/2018    Years since quitting: 0.8  . Smokeless tobacco: Never Used  . Tobacco comment: "very little"  Substance Use Topics  . Alcohol use: No  . Drug use: No     Allergies   Wellbutrin [bupropion]   Review of Systems Review of Systems  All other systems reviewed and are negative.    Physical Exam Updated Vital Signs BP 105/68 (BP Location: Right Arm)   Pulse (!) 107   Temp 98.4 F (36.9 C) (Oral)   Resp 18   Ht 5' 9.5" (1.765 m)   Wt 70.3 kg   SpO2 97%   BMI 22.56 kg/m   Physical Exam Vitals  signs and nursing note reviewed.  Constitutional:      General:  He is not in acute distress.    Appearance: He is well-developed. He is not diaphoretic.  HENT:     Head: Normocephalic and atraumatic.  Neck:     Musculoskeletal: Normal range of motion and neck supple.  Cardiovascular:     Rate and Rhythm: Normal rate and regular rhythm.     Heart sounds: No murmur. No friction rub.  Pulmonary:     Effort: Pulmonary effort is normal. No respiratory distress.     Breath sounds: Normal breath sounds. No wheezing or rales.  Abdominal:     General: Bowel sounds are normal. There is no distension.     Palpations: Abdomen is soft.     Tenderness: There is no abdominal tenderness.  Musculoskeletal: Normal range of motion.  Skin:    General: Skin is warm and dry.  Neurological:     Mental Status: He is alert and oriented to person, place, and time.     Coordination: Coordination normal.  Psychiatric:        Attention and Perception: Attention normal.        Mood and Affect: Mood is depressed.        Speech: Speech normal.        Behavior: Behavior normal.        Thought Content: Thought content includes suicidal ideation. Thought content includes suicidal plan.        Cognition and Memory: Cognition and memory normal.      ED Treatments / Results  Labs (all labs ordered are listed, but only abnormal results are displayed) Labs Reviewed  COMPREHENSIVE METABOLIC PANEL  ETHANOL  SALICYLATE LEVEL  ACETAMINOPHEN LEVEL  CBC  RAPID URINE DRUG SCREEN, HOSP PERFORMED    EKG None  Radiology No results found.  Procedures Procedures (including critical care time)  Medications Ordered in ED Medications - No data to display   Initial Impression / Assessment and Plan / ED Course  I have reviewed the triage vital signs and the nursing notes.  Pertinent labs & imaging results that were available during my care of the patient were reviewed by me and considered in my medical  decision making (see chart for details).  Patient brought by authorities after making suicidal threats to law enforcement.  Patient appears medically cleared and will be evaluated by TTS.  They will assist in determining the final disposition.  Final Clinical Impressions(s) / ED Diagnoses   Final diagnoses:  None    ED Discharge Orders    None       Veryl Speak, MD 09/09/19 2255

## 2019-09-09 NOTE — BH Assessment (Addendum)
Tele Assessment Note   Patient Name: Don [name] MRN: [id number] Referring Physician: Dr. Gwenyth Bender Location of Patient: [facility] Location of Provider: [place] is an 78 y.o. male presenting with SI with plan to get a piece of water hose, cut it and tape it to the muffler of the car and run it in the window and crank the car. Patient reported onset of SI with plan was recently after focusing on his quality of life, regarding medical and not able to do certain things anymore, like driving. Patient receives dialysis. Patient reported "I feel like a burden, I am tired and burnt out, I have too much on my mind". Patient reported "I don't want to do it but I might". Patient reported hearing knocking on the door and nobody is there. Patient reported no history of SI, inpatient, SI attempts or self harming behaviors. Patient reported increased depressive symptoms.  Patient resides alone. Patient states his step son [name] is his POA but states "You watch when he comes in here he will make me feel guilty, he has my mind so tore up and he has all my money, I don't have any money, I can't drive." Patient reported stepson does a lot for him, however patient feels stepson goes over board with controlling everything which infringes on patients rights as a human being. Patient was pleasant and cooperative during assessment.  Diagnosis: Major depressive disorder  Past Medical History:  Past Medical History:  Diagnosis Date  . Acute bronchitis 04/03/2014  . Anemia    low iron  . Anxiety   . Atrial fibrillation ([place])   . Cataract   . COPD (chronic obstructive pulmonary disease) ([place])   . Dementia ([place])   . Depression   . Essential hypertension   . Hyperlipidemia   . Macular degeneration   . Noncompliance with medications 04/2013   Xarelto, digoxin previously  . Pre-diabetes   . Prostate cancer ([place])   . Stage III chronic kidney disease 04/03/2014    Stage 5 Dialysis on T/Th/Sa  . Tubular adenoma of colon 07/31/02, 11/18/03    Past Surgical History:  Procedure Laterality Date  . A/V FISTULAGRAM Left 05/30/2019   Procedure: A/V FISTULAGRAM;  Surgeon: Elam Dutch, MD;  Location: [place] [facility];  Service: Cardiovascular;  Laterality: Left;  . Anal abcess,Hemorroids,    . AV FISTULA PLACEMENT Left 06/23/2019   Procedure: INSERTION OF ARTERIOVENOUS (AV) GORE-TEX GRAFT ARM;  Surgeon: Elam Dutch, MD;  Location: [place];  Service: Vascular;  Laterality: Left;  . BASCILIC VEIN TRANSPOSITION Left 10/08/2018   Procedure: FIRST STAGE BASILIC VEIN TRANSPOSITION LEFT ARM;  Surgeon: Angelia Mould, MD;  Location: [place];  Service: Vascular;  Laterality: Left;  . BASCILIC VEIN TRANSPOSITION Left 12/16/2018   Procedure: BASILIC VEIN TRANSPOSITION SECOND STAGE;  Surgeon: Angelia Mould, MD;  Location: [facility] OR;  Service: Vascular;  Laterality: Left;  . COLONOSCOPY N/A 04/05/2014   Dr. Fuller Plan: 6 mm sessile polyp from sigmoid colon (tubular adenoma), internal hemorrhoids  . COLONOSCOPY W/ BIOPSIES  11/18/2003   Dr. Earle Gell  . ESOPHAGOGASTRODUODENOSCOPY  11/18/2003   Dr. Earle Gell  . ESOPHAGOGASTRODUODENOSCOPY N/A 04/05/2014   Dr. Fuller Plan: variable Z line, negative Barrett's, small hiatal hernia  . ESOPHAGOGASTRODUODENOSCOPY N/A 02/02/2017   Dr. Oneida Alar: LA Grade B esophagitis, one moderate benign-appearing intrinsic stenosis traversed, small non-bleeding diverticulum in second portion of duodenum, reactive gastropathy, no H.pylori.  [name]  ESOPHAGOGASTRODUODENOSCOPY N/A 03/22/2018   Procedure: ESOPHAGOGASTRODUODENOSCOPY (EGD);  Surgeon: Daneil Dolin, MD;  [facility];  Service: Endoscopy;  Laterality: N/A;  . GIVENS CAPSULE STUDY N/A 03/22/2018   Procedure: GIVENS CAPSULE STUDY;  Surgeon: Daneil Dolin, MD;  [facility];  Service: Endoscopy;  Laterality: N/A;  . IR FLUORO GUIDE CV LINE RIGHT  10/29/2018   . IR REMOVAL TUN CV CATH W/O FL  08/06/2019  . IR US GUIDE VASC ACCESS RIGHT  10/29/2018  . PERIPHERAL VASCULAR BALLOON ANGIOPLASTY Left 03/14/2019   Procedure: PERIPHERAL VASCULAR BALLOON ANGIOPLASTY;  Surgeon: Angelia Mould, MD;  Location: [place] [facility];  Service: Cardiovascular;  Laterality: Left;  . PERIPHERAL VASCULAR BALLOON ANGIOPLASTY  05/30/2019   Procedure: PERIPHERAL VASCULAR BALLOON ANGIOPLASTY;  Surgeon: Elam Dutch, MD;  Location: [place] [facility];  Service: Cardiovascular;;  left arm fistula  . RETROPUBIC PROSTATECTOMY  11/26/2001  . TONSILLECTOMY      Family History:  Family History  Problem Relation Age of Onset  . Diabetes Father   . Heart disease Father 22       MI  . Heart attack Father   . Heart attack Mother   . Heart disease Brother   . Cancer Brother        lung  . Lung cancer Brother   . Heart disease Brother   . Lung cancer Sister   . Cancer Sister        lung  . Heart disease Brother   . Heart disease Brother     Social History:  reports that he quit smoking about 10 months ago. His smoking use included cigarettes and cigars. He has a 31.00 pack-year smoking history. He has never used smokeless tobacco. He reports that he does not drink alcohol or use drugs.  Additional Social History:  Alcohol / Drug Use Pain Medications: see MAR Prescriptions: see MAR Over the Counter: see MAR  CIWA: CIWA-Ar BP: 105/68 Pulse Rate: (!) 107 COWS:    Allergies:  Allergies  Allergen Reactions  . Wellbutrin [Bupropion] Anxiety    Home Medications: (Not in a hospital admission)   OB/GYN Status:  No LMP for male patient.  General Assessment Data Location of Assessment: [facility] ED TTS Assessment: In system Is this a Tele or Face-to-Face Assessment?: Tele Assessment Is this an Initial Assessment or a Re-assessment for this encounter?: Initial Assessment Patient Accompanied by:: N/A Language Other than English: No Living Arrangements:  (alone) What gender do you identify as?: Male Marital status: Widowed Living Arrangements: Alone Can pt return to current living arrangement?: Yes Admission Status: Voluntary Is patient capable of signing voluntary admission?: Yes Referral Source: Self/Family/Friend     Crisis Care Plan Living Arrangements: Alone Legal Guardian: (self) Name of Psychiatrist: (none) Name of Therapist: (none)  Education Status Is patient currently in school?: No Is the patient employed, unemployed or receiving disability?: Receiving disability income  Risk to self with the past 6 months Suicidal Ideation: Yes-Currently Present Has patient been a risk to self within the past 6 months prior to admission? : Yes Suicidal Intent: Yes-Currently Present Has patient had any suicidal intent within the past 6 months prior to admission? : Yes Is patient at risk for suicide?: Yes Suicidal Plan?: Yes-Currently Present Has patient had any suicidal plan within the past 6 months prior to admission? : Yes Specify Current Suicidal Plan: (hose to car exhaust) Access to Means: Yes Specify Access to Suicidal Means: (access to  hose and car) What has been your use of drugs/alcohol within the last 12 months?: (none) Previous Attempts/Gestures: No How many times?: (0) Other Self Harm Risks: (none) Triggers for Past Attempts: (n/a) Intentional Self Injurious Behavior: None Family Suicide History: No Recent stressful life event(s): (medical, quality of life) Persecutory voices/beliefs?: No Depression: Yes Depression Symptoms: Isolating, Fatigue, Guilt, Loss of interest in usual pleasures, Feeling worthless/self pity, Insomnia Substance abuse history and/or treatment for substance abuse?: No Suicide prevention information given to non-admitted patients: Not applicable  Risk to Others within the past 6 months Homicidal Ideation: No Does patient have any lifetime risk of violence toward others beyond the six months  prior to admission? : No Thoughts of Harm to Others: No Current Homicidal Intent: No Current Homicidal Plan: No Access to Homicidal Means: No Identified Victim: (n/a) History of harm to others?: No Assessment of Violence: None Noted Violent Behavior Description: (n/a) Does patient have access to weapons?: No Criminal Charges Pending?: No Does patient have a court date: No Is patient on probation?: No  Psychosis Hallucinations: None noted Delusions: None noted  Mental Status Report Appearance/Hygiene: Unremarkable Eye Contact: Fair Motor Activity: Freedom of movement Speech: Logical/coherent Level of Consciousness: Alert Mood: Depressed Affect: Depressed Anxiety Level: Minimal Thought Processes: Coherent, Relevant Judgement: Impaired Orientation: Person, Place, Time, Situation Obsessive Compulsive Thoughts/Behaviors: None  Cognitive Functioning Concentration: Normal Memory: Recent Intact Is patient IDD: No Insight: Poor Impulse Control: Poor Appetite: Poor Have you had any weight changes? : No Change Sleep: No Change Total Hours of Sleep: (varies) Vegetative Symptoms: None  ADLScreening [facility] Assessment Services) Patient's cognitive ability adequate to safely complete daily activities?: Yes Patient able to express need for assistance with ADLs?: Yes Independently performs ADLs?: Yes (appropriate for developmental age)  Prior Inpatient Therapy Prior Inpatient Therapy: No  Prior Outpatient Therapy Prior Outpatient Therapy: No Does patient have an ACCT team?: No Does patient have Intensive In-House Services?  : No Does patient have [facility] services? : No Does patient have P4CC services?: No  ADL Screening (condition at time of admission) Patient's cognitive ability adequate to safely complete daily activities?: Yes Patient able to express need for assistance with ADLs?: Yes Independently performs ADLs?: Yes (appropriate for developmental age)    Armed forces training and education officer (For Healthcare) Does Patient Have a Medical Advance Directive?: Yes Type of Advance Directive: Healthcare Power of Attorney   Disposition:  Disposition Initial Assessment Completed for this Encounter: Yes  Talbot Grumbling, NP, patient meets inpatient treatment. Geropsych recommended. TTS to secure placement.  This service was provided via telemedicine using a 2-way, interactive audio and video technology.  Names of all persons participating in this telemedicine service and their role in this encounter. Name: Adison Jerger Role: Patient  Name: Kirtland Bouchard Role: TTS Clinician  Name:  Role:   Name:  Role:     Venora Maples 09/09/2019 11:49 PM

## 2019-09-09 NOTE — Telephone Encounter (Signed)
Fax received mdINR PT/INR self testing service Test date/time 09/09/19 10:38 am INR 3.1

## 2019-09-10 ENCOUNTER — Emergency Department (HOSPITAL_COMMUNITY): Payer: Medicare HMO

## 2019-09-10 DIAGNOSIS — R911 Solitary pulmonary nodule: Secondary | ICD-10-CM | POA: Diagnosis not present

## 2019-09-10 DIAGNOSIS — J449 Chronic obstructive pulmonary disease, unspecified: Secondary | ICD-10-CM | POA: Diagnosis not present

## 2019-09-10 DIAGNOSIS — J90 Pleural effusion, not elsewhere classified: Secondary | ICD-10-CM | POA: Diagnosis not present

## 2019-09-10 LAB — RAPID URINE DRUG SCREEN, HOSP PERFORMED
Amphetamines: NOT DETECTED
Barbiturates: NOT DETECTED
Benzodiazepines: NOT DETECTED
Cocaine: NOT DETECTED
Opiates: NOT DETECTED
Tetrahydrocannabinol: NOT DETECTED

## 2019-09-10 LAB — SARS CORONAVIRUS 2 BY RT PCR (HOSPITAL ORDER, PERFORMED IN ~~LOC~~ HOSPITAL LAB): SARS Coronavirus 2: NEGATIVE

## 2019-09-10 MED ORDER — DIPHENHYDRAMINE HCL 25 MG PO CAPS
25.0000 mg | ORAL_CAPSULE | Freq: Once | ORAL | Status: AC
  Administered 2019-09-10: 25 mg via ORAL
  Filled 2019-09-10: qty 1

## 2019-09-10 MED ORDER — DIPHENHYDRAMINE HCL 25 MG PO CAPS
25.0000 mg | ORAL_CAPSULE | Freq: Once | ORAL | Status: AC
  Administered 2019-09-10: 22:00:00 25 mg via ORAL
  Filled 2019-09-10: qty 1

## 2019-09-10 MED ORDER — IOHEXOL 300 MG/ML  SOLN
75.0000 mL | Freq: Once | INTRAMUSCULAR | Status: AC | PRN
  Administered 2019-09-10: 13:00:00 75 mL via INTRAVENOUS

## 2019-09-10 NOTE — Progress Notes (Signed)
Pt meets inpatient criteria per Talbot Grumbling, NP. Referral information has been sent to the following hospitals for review:   [facility]    [place]      Disposition will continue to follow for inpatient placement needs.   Audree Camel, LCSW, [place] Disposition [facility]S [phone]

## 2019-09-10 NOTE — ED Notes (Signed)
Pt c/o chest pain, edp notified and ekg done.

## 2019-09-10 NOTE — ED Notes (Signed)
Pt resting, eyes closed.  Visible chest rise and fall.  Sitter at bedside.

## 2019-09-10 NOTE — ED Provider Notes (Addendum)
Currently resting comfortably in hallway bed.  He dialyzed yesterday without complication.  He has been seen by TTS who were recommended to gero-psychiatric placement.   EKG Interpretation  Date/Time:  Wednesday September 10 2019 05:42:20 EDT Ventricular Rate:  98 PR Interval:    QRS Duration: 98 QT Interval:  346 QTC Calculation: 441 R Axis:   7 Text Interpretation: Atrial fibrillation Abnormal ECG since last tracing no significant change Confirmed by Daleen Bo [phone]) on 09/10/2019 8:19:27 AM        Daleen Bo, MD 09/10/19 0820  11:35 AM-screening chest x-ray done for placement, is abnormal consistent with a 7 cm mass left base morning.  I have discussed the case with nephrology, Dr. Servando Salina, who is agreeable to let the patient have IV contrast.  CT chest with IV contrast ordered.  Patient is aware and comfortable with this evaluation, I have also discussed this with his POA.  At this time the patient thinks he needs to be placed in a skilled nursing facility.  Clinical Course as of Sep 09 1537  Wed Sep 10, 2019  1034 I have updated the patient's POA, [name], about the abnormal chest x-ray finding.  He is in agreement with further testing by CT imaging.   [EW]  67 Mass left lower lobe, with central lucency concerning for necrotic changes.  Scan interpreted by me  CT Chest W Contrast [EW]  1535 Normal  SARS Coronavirus 2 by RT PCR (hospital order, performed in [place] hospital lab) Nasopharyngeal Nasopharyngeal Swab [EW]  1535 Normal except chloride low, glucose high, BUN high, creatinine high, calcium low, total protein low, albumin low, AST low, GFR low  Comprehensive metabolic panel(!) [EW]  8546 Normal except hemoglobin low  cbc(!) [EW]    Clinical Course User Index [EW] Daleen Bo, MD    Medications  diphenhydrAMINE (BENADRYL) capsule 25 mg (25 mg Oral Given 09/10/19 0139)  iohexol (OMNIPAQUE) 300 MG/ML solution 75 mL (75 mLs Intravenous  Contrast Given 09/10/19 1256)     Patient Vitals for the past 24 hrs:  BP Temp Temp src Pulse Resp SpO2 Height Weight  09/10/19 1004 (!) 107/58 98.2 F (36.8 C) Oral (!) 114 20 96 % - -  09/10/19 0630 107/66 - - 93 18 97 % - -  09/09/19 2100 - - - - - - 5' 9.5" (1.765 m) 70.3 kg  09/09/19 2059 105/68 98.4 F (36.9 C) Oral (!) 107 18 97 % - -    3:36 PM Reevaluation with update and discussion. After initial assessment and treatment, an updated evaluation reveals no change in clinical status. Daleen Bo   Medical Decision Making: Patient being evaluated for suicidal ideation, with screening chest x-ray which is abnormal, indicating lung mass.  CT imaging ordered to clarify, and is consistent with necrotic mass consistent with lung cancer.  No other overt signs for cancer at this time.  No indication for metastatic cancer at this time.  Patient will need bronchoscopy for diagnosis and tissue collection.  Case discussed with pulmonology to arrange for follow-up care suggestions and guidance.  KIREN MCISAAC was evaluated in Emergency Department on 09/10/2019 for the symptoms described in the history of present illness. He was evaluated in the context of the global COVID-19 pandemic, which necessitated consideration that the patient might be at risk for infection with the SARS-CoV-2 virus that causes COVID-19. Institutional protocols and algorithms that pertain to the evaluation of patients at risk for COVID-19 are in a state  of rapid change based on information released by regulatory bodies including the CDC and federal and state organizations. These policies and algorithms were followed during the patient's care in the ED.  .Critical Care Performed by: Daleen Bo, MD Authorized by: Daleen Bo, MD   Critical care provider statement:    Critical care time (minutes):  35   Critical care start time:  09/10/2019 8:15 AM   Critical care end time:  09/10/2019 3:38 PM   Critical care time  was exclusive of:  Separately billable procedures and treating other patients   Critical care was necessary to treat or prevent imminent or life-threatening deterioration of the following conditions:  Respiratory failure   Critical care was time spent personally by me on the following activities:  Blood draw for specimens, development of treatment plan with patient or surrogate, discussions with consultants, evaluation of patient's response to treatment, examination of patient, obtaining history from patient or surrogate, ordering and performing treatments and interventions, ordering and review of laboratory studies, pulse oximetry, re-evaluation of patient's condition, review of old charts and ordering and review of radiographic studies    CRITICAL CARE-yes Performed by: Daleen Bo   Pulmonary consult requested for help with disposition. Care to oncoming provider team to arrange appropriate disposition with TTS and pulmonary.   Daleen Bo, MD 09/10/19 [phone]

## 2019-09-10 NOTE — ED Provider Notes (Addendum)
  Physical Exam  BP (!) 107/58 (BP Location: Right Arm)   Pulse (!) 114   Temp 98.2 F (36.8 C) (Oral)   Resp 20   Ht 5' 9.5" (1.765 m)   Wt 70.3 kg   SpO2 96%   BMI 22.56 kg/m   Physical Exam  ED Course/Procedures   Clinical Course as of Sep 09 1612  Wed Sep 10, 2019  1034 I have updated the patient's POA, [name], about the abnormal chest x-ray finding.  He is in agreement with further testing by CT imaging.   [EW]  54 Mass left lower lobe, with central lucency concerning for necrotic changes.  Scan interpreted by me  CT Chest W Contrast [EW]  1535 Normal  SARS Coronavirus 2 by RT PCR (hospital order, performed in [facility] hospital lab) Nasopharyngeal Nasopharyngeal Swab [EW]  1535 Normal except chloride low, glucose high, BUN high, creatinine high, calcium low, total protein low, albumin low, AST low, GFR low  Comprehensive metabolic panel(!) [EW]  9675 Normal except hemoglobin low  cbc(!) [EW]    Clinical Course User Index [EW] Daleen Bo, MD    Procedures  MDM  Discussed with Dr. Lamonte Sakai from pulmonology.  He has arranged follow-up next week Wednesday.  Appears stable for inpatient psychiatric treatment at this time.      Davonna Belling, MD 09/10/19 1613  I updated the patient's power of attorney on results and plan.    Davonna Belling, MD 09/10/19 [phone]

## 2019-09-11 LAB — PROTIME-INR
INR: 2 — ABNORMAL HIGH (ref 0.8–1.2)
Prothrombin Time: 22.7 seconds — ABNORMAL HIGH (ref 11.4–15.2)

## 2019-09-11 MED ORDER — UMECLIDINIUM BROMIDE 62.5 MCG/INH IN AEPB
1.0000 | INHALATION_SPRAY | Freq: Every day | RESPIRATORY_TRACT | Status: DC
  Administered 2019-09-11: 13:00:00 1 via RESPIRATORY_TRACT
  Filled 2019-09-11: qty 7

## 2019-09-11 MED ORDER — CHLORHEXIDINE GLUCONATE CLOTH 2 % EX PADS: 6.0000 | MEDICATED_PAD | Freq: Every day | CUTANEOUS | Status: DC

## 2019-09-11 MED ORDER — ATORVASTATIN CALCIUM 10 MG PO TABS: 20.0000 mg | ORAL_TABLET | Freq: Every day | ORAL | Status: DC

## 2019-09-11 MED ORDER — DILTIAZEM HCL ER COATED BEADS 120 MG PO CP24
120.0000 mg | ORAL_CAPSULE | Freq: Every day | ORAL | Status: DC
  Administered 2019-09-11: 120 mg via ORAL
  Filled 2019-09-11 (×2): qty 1

## 2019-09-11 MED ORDER — BISOPROLOL FUMARATE 5 MG PO TABS
10.0000 mg | ORAL_TABLET | Freq: Every morning | ORAL | Status: DC
  Administered 2019-09-11: 10 mg via ORAL
  Filled 2019-09-11 (×2): qty 2

## 2019-09-11 MED ORDER — FERROUS SULFATE 325 (65 FE) MG PO TABS
325.0000 mg | ORAL_TABLET | ORAL | Status: DC
  Filled 2019-09-11: qty 1

## 2019-09-11 MED ORDER — SEVELAMER CARBONATE 800 MG PO TABS
800.0000 mg | ORAL_TABLET | Freq: Three times a day (TID) | ORAL | Status: DC
  Administered 2019-09-11: 800 mg via ORAL
  Filled 2019-09-11 (×4): qty 1

## 2019-09-11 MED ORDER — TERBINAFINE HCL 250 MG PO TABS
250.0000 mg | ORAL_TABLET | ORAL | Status: DC
  Filled 2019-09-11: qty 1

## 2019-09-11 MED ORDER — LIDOCAINE HCL (PF) 1 % IJ SOLN: 5.0000 mL | INTRAMUSCULAR | Status: DC | PRN

## 2019-09-11 MED ORDER — LIDOCAINE-PRILOCAINE 2.5-2.5 % EX CREA: 1.0000 "application " | TOPICAL_CREAM | CUTANEOUS | Status: DC | PRN

## 2019-09-11 MED ORDER — CLONAZEPAM 0.5 MG PO TABS: 0.5000 mg | ORAL_TABLET | Freq: Every day | ORAL | Status: DC

## 2019-09-11 MED ORDER — ACETAMINOPHEN 325 MG PO TABS
325.0000 mg | ORAL_TABLET | Freq: Four times a day (QID) | ORAL | Status: DC | PRN
  Administered 2019-09-11: 650 mg via ORAL
  Filled 2019-09-11: qty 2

## 2019-09-11 MED ORDER — PANTOPRAZOLE SODIUM 40 MG PO TBEC
40.0000 mg | DELAYED_RELEASE_TABLET | Freq: Two times a day (BID) | ORAL | Status: DC
  Administered 2019-09-11: 13:00:00 40 mg via ORAL
  Filled 2019-09-11: qty 1

## 2019-09-11 MED ORDER — MOMETASONE FURO-FORMOTEROL FUM 200-5 MCG/ACT IN AERO
2.0000 | INHALATION_SPRAY | Freq: Two times a day (BID) | RESPIRATORY_TRACT | Status: DC
  Administered 2019-09-11: 13:00:00 2 via RESPIRATORY_TRACT
  Filled 2019-09-11: qty 8.8

## 2019-09-11 MED ORDER — WARFARIN - PHARMACIST DOSING INPATIENT: Freq: Every day | Status: DC

## 2019-09-11 MED ORDER — PENTAFLUOROPROP-TETRAFLUOROETH EX AERO: 1.0000 "application " | INHALATION_SPRAY | CUTANEOUS | Status: DC | PRN

## 2019-09-11 MED ORDER — WARFARIN SODIUM 4 MG PO TABS
4.0000 mg | ORAL_TABLET | Freq: Once | ORAL | Status: DC
  Filled 2019-09-11: qty 1

## 2019-09-11 MED ORDER — SODIUM CHLORIDE 0.9 % IV SOLN: 100.0000 mL | INTRAVENOUS | Status: DC | PRN

## 2019-09-11 NOTE — Progress Notes (Signed)
ANTICOAGULATION CONSULT NOTE - Initial Consult  Pharmacy Consult for Coumadin Indication: atrial fibrillation  Allergies  Allergen Reactions  . Wellbutrin [Bupropion] Anxiety    Patient Measurements: Height: 5' 9.5" (176.5 cm) Weight: 155 lb (70.3 kg) IBW/kg (Calculated) : 71.85  Vital Signs: Temp: 99 F (37.2 C) (10/29 0633) Temp Source: Oral (10/29 9485) BP: 129/77 (10/29 4627) Pulse Rate: 117 (10/29 0633)  Labs: Recent Labs    09/09/19 2124 09/11/19 1201  HGB 10.1*  --   HCT 32.9*  --   PLT 268  --   LABPROT  --  22.7*  INR  --  2.0*  CREATININE 4.99*  --     Estimated Creatinine Clearance: 11.7 mL/min (A) (by C-G formula based on SCr of 4.99 mg/dL (H)).   Medical History: Past Medical History:  Diagnosis Date  . Acute bronchitis 04/03/2014  . Anemia    low iron  . Anxiety   . Atrial fibrillation ([place])   . Cataract   . COPD (chronic obstructive pulmonary disease) ([place])   . Dementia ([place])   . Depression   . Essential hypertension   . Hyperlipidemia   . Macular degeneration   . Noncompliance with medications 04/2013   Xarelto, digoxin previously  . Pre-diabetes   . Prostate cancer ([place])   . 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 Tubular adenoma of colon 07/31/02, 11/18/03    Medications:  See med rec  Assessment: Patient brought to ED for evaluation of suicidal ideation.  Patient made threats of harming himself by carbon monoxide poisoning . He is chronic coumadin therapy for afib.  INR is 2.0 today.  Home regimen currently is 4mg  daily except 5mg  on Wednesday.   Goal of Therapy:  INR 2-3 Monitor platelets by anticoagulation protocol: Yes   Plan:  Coumadin 4mg  po x 1 today PT-INR daily Monitor for S/S of bleeding  Isac Sarna, BS Vena [place], BCPS Clinical Pharmacist Pager [phone] 09/11/2019,12:27 PM

## 2019-09-11 NOTE — ED Provider Notes (Addendum)
1:55 PM-update on progress today.  He has been psychiatrically cleared.  Social worker has evaluated the patient and feel like she does not have indicators for placement at this time.  Physical therapy plans on seeing the patient to assess his physical therapy needs, prior to discharge.  They plan to do that shortly.  Renal has consulted with the patient and written orders for dialysis, today.  At this time the patient's son is here, his name is Joneen Caraway.  He is not the POA.  I discussed the findings, as per yesterday regarding his lung mass.  The patient has an appointment with pulmonary, on 09/17/2019 at 3:45 PM.  This will be to evaluate initiate management of lung mass.  The patient is psychiatrically, medically and physically stable for discharge to outpatient care in his home, with home health services.  Care management consult requested, and home health orders written by me.  2:25 PM-he has been seen by physical therapy who feel he can be discharged home with ongoing management, there.  Findings and plan updated with the patient.  He is due to be dialyzed soon, then will be discharged.  He understands that he needs to follow-up with pulmonology, next week for evaluation of the lung mass.   Note: Recommend palliative care consultation in the home with home health services.    Daleen Bo, MD 09/11/19 [phone]

## 2019-09-11 NOTE — Consult Note (Signed)
[facility] Renal Consultation Note    Indication for Consultation:  Management of ESRD/hemodialysis; anemia, hypertension/volume and secondary hyperparathyroidism  HPI: Don [name] is a 78 y.o. male.  [name] has an extensive PMH significant for COPD, atrial fibrillation, DM, prostate Ca, ESRD, anxiety, HTN, HLD, and dementia who was brought to [facility] ED from his home when he expressed suicidal ideations.  He was evaluated by Psych and does not require inpatient psychiatric care.  WE were consulted to provide HD until he can be discharged to assisted living vs home with [facility].   Past Medical History:  Diagnosis Date  . Acute bronchitis 04/03/2014  . Anemia    low iron  . Anxiety   . Atrial fibrillation ([place])   . Cataract   . COPD (chronic obstructive pulmonary disease) ([place])   . Dementia ([place])   . Depression   . Essential hypertension   . Hyperlipidemia   . Macular degeneration   . Noncompliance with medications 04/2013   Xarelto, digoxin previously  . Pre-diabetes   . Prostate cancer ([place])   . 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 Tubular adenoma of colon 07/31/02, 11/18/03   Past Surgical History:  Procedure Laterality Date  . A/V FISTULAGRAM Left 05/30/2019   Procedure: A/V FISTULAGRAM;  Surgeon: Elam Dutch, MD;  Location: [place] [facility];  Service: Cardiovascular;  Laterality: Left;  . Anal abcess,Hemorroids,    . AV FISTULA PLACEMENT Left 06/23/2019   Procedure: INSERTION OF ARTERIOVENOUS (AV) GORE-TEX GRAFT ARM;  Surgeon: Elam Dutch, MD;  Location: [place];  Service: Vascular;  Laterality: Left;  . BASCILIC VEIN TRANSPOSITION Left 10/08/2018   Procedure: FIRST STAGE BASILIC VEIN TRANSPOSITION LEFT ARM;  Surgeon: Angelia Mould, MD;  Location: [place];  Service: Vascular;  Laterality: Left;  . BASCILIC VEIN TRANSPOSITION Left 12/16/2018   Procedure: BASILIC VEIN TRANSPOSITION SECOND STAGE;  Surgeon:  Angelia Mould, MD;  Location: [facility];  Service: Vascular;  Laterality: Left;  . COLONOSCOPY N/A 04/05/2014   Dr. Fuller Plan: 6 mm sessile polyp from sigmoid colon (tubular adenoma), internal hemorrhoids  . COLONOSCOPY W/ BIOPSIES  11/18/2003   Dr. Earle Gell  . ESOPHAGOGASTRODUODENOSCOPY  11/18/2003   Dr. Earle Gell  . ESOPHAGOGASTRODUODENOSCOPY N/A 04/05/2014   Dr. Fuller Plan: variable Z line, negative Barrett's, small hiatal hernia  . ESOPHAGOGASTRODUODENOSCOPY N/A 02/02/2017   Dr. Oneida Alar: LA Grade B esophagitis, one moderate benign-appearing intrinsic stenosis traversed, small non-bleeding diverticulum in second portion of duodenum, reactive gastropathy, no H.pylori.  . ESOPHAGOGASTRODUODENOSCOPY N/A 03/22/2018   Procedure: ESOPHAGOGASTRODUODENOSCOPY (EGD);  Surgeon: Daneil Dolin, MD;  [facility];  Service: Endoscopy;  Laterality: N/A;  . GIVENS CAPSULE STUDY N/A 03/22/2018   Procedure: GIVENS CAPSULE STUDY;  Surgeon: Daneil Dolin, MD;  [facility];  Service: Endoscopy;  Laterality: N/A;  . IR FLUORO GUIDE CV LINE RIGHT  10/29/2018  . IR REMOVAL TUN CV CATH W/O FL  08/06/2019  . IR US GUIDE VASC ACCESS RIGHT  10/29/2018  . PERIPHERAL VASCULAR BALLOON ANGIOPLASTY Left 03/14/2019   Procedure: PERIPHERAL VASCULAR BALLOON ANGIOPLASTY;  Surgeon: Angelia Mould, MD;  Location: [place] [facility];  Service: Cardiovascular;  Laterality: Left;  . PERIPHERAL VASCULAR BALLOON ANGIOPLASTY  05/30/2019   Procedure: PERIPHERAL VASCULAR BALLOON ANGIOPLASTY;  Surgeon: Elam Dutch, MD;  Location: [place] [facility];  Service: Cardiovascular;;  left arm fistula  . RETROPUBIC PROSTATECTOMY  11/26/2001  . TONSILLECTOMY     Family History:   Family History  Problem Relation Age of Onset  . Diabetes Father   . Heart disease Father 66       MI  . Heart attack Father   . Heart attack Mother   . Heart disease Brother   . Cancer Brother        lung  . Lung cancer  Brother   . Heart disease Brother   . Lung cancer Sister   . Cancer Sister        lung  . Heart disease Brother   . Heart disease Brother    Social History:  reports that he quit smoking about 10 months ago. His smoking use included cigarettes and cigars. He has a 31.00 pack-year smoking history. He has never used smokeless tobacco. He reports that he does not drink alcohol or use drugs. Allergies  Allergen Reactions  . Wellbutrin [Bupropion] Anxiety   Prior to Admission medications   Medication Sig Start Date End Date Taking? Authorizing Provider  acetaminophen (TYLENOL) 325 MG tablet Take 325-650 mg by mouth every 6 (six) hours as needed for moderate pain.    Yes [provider]  albuterol (PROAIR HFA) 108 (90 Base) MCG/ACT inhaler Inhale 2 puffs into the lungs 2 (two) times daily.    Yes [provider]  atorvastatin (LIPITOR) 20 MG tablet Take 1 tablet (20 mg total) by mouth daily. (Needs to be seen) Patient taking differently: Take 20 mg by mouth at bedtime.  08/16/18  Yes Dettinger, Fransisca Kaufmann, MD  bisoprolol (ZEBETA) 10 MG tablet TAKE (1) TABLET TWICE A DAY. Patient taking differently: Take 10 mg by mouth every morning.  10/18/18  Yes Dettinger, Fransisca Kaufmann, MD  clonazePAM (KLONOPIN) 1 MG tablet TAKE 1/2 TABLET AT BEDTIME Patient taking differently: Take 0.5 mg by mouth at bedtime.  04/30/19  Yes Dettinger, Fransisca Kaufmann, MD  diltiazem (CARDIZEM CD) 120 MG 24 hr capsule Take 1 capsule (120 mg total) by mouth daily. 04/08/19  Yes Dettinger, Fransisca Kaufmann, MD  ferrous sulfate 325 (65 FE) MG tablet Take 325 mg by mouth every Monday, Wednesday, and Friday.   Yes [provider]  fluticasone (FLONASE) 50 MCG/ACT nasal spray Place 2 sprays into both nostrils daily. Patient taking differently: Place 2 sprays into both nostrils daily as needed for allergies.  11/25/18  Yes Gottschalk, Leatrice Jewels M, DO  hydroxypropyl methylcellulose / hypromellose (ISOPTO TEARS / GONIOVISC) 2.5 %  ophthalmic solution Place 1 drop into both eyes 2 (two) times daily as needed for dry eyes.    Yes [provider]  lidocaine-prilocaine (EMLA) cream Apply 1 application topically as needed. 06/03/19  Yes Dettinger, Fransisca Kaufmann, MD  loratadine (CLARITIN) 10 MG tablet TAKE 1 TABLET DAILY 07/09/19  Yes Dettinger, Fransisca Kaufmann, MD  Melatonin 3 MG TABS Take 3 mg by mouth at bedtime.    Yes [provider]  Multiple Vitamins-Minerals (MENS ONE DAILY PO) Take 1 tablet by mouth daily.   Yes [provider]  Multiple Vitamins-Minerals (PRESERVISION AREDS 2+MULTI VIT) CAPS Take 1 capsule by mouth 2 (two) times daily.   Yes [provider]  omega-3 acid ethyl esters (LOVAZA) 1 g capsule Take 2 g by mouth 2 (two) times daily.   Yes [provider]  Omega-3 Fatty Acids (FISH OIL) 1000 MG CAPS Take 2 capsules by mouth 2 (two) times daily.   Yes [provider]  pantoprazole (PROTONIX) 40 MG  tablet TAKE (1) TABLET TWICE A DAY. Patient taking differently: Take 40 mg by mouth 2 (two) times daily.  06/25/19  Yes Dettinger, Fransisca Kaufmann, MD  sevelamer carbonate (RENVELA) 800 MG tablet Take 800 mg by mouth 3 (three) times daily with meals.   Yes [provider]  terbinafine (LAMISIL) 250 MG tablet Take it 3 times weekly on dialysis day after dialysis Patient taking differently: Take 250 mg by mouth See admin instructions. Take 1 tablet daily on dialysis days - Cain Saupe, Sat 04/08/19  Yes Dettinger, Fransisca Kaufmann, MD  tiotropium (SPIRIVA) 18 MCG inhalation capsule Place 18 mcg into inhaler and inhale daily.    Yes [provider]  warfarin (COUMADIN) 2 MG tablet TAKE 2 TABLETS (4MG ) DAILY EXCEPT ON MONDAY AND THURS TAKE 2 & 1/2 (5MG ) Patient taking differently: Take 4 mg by mouth daily.  08/26/19  Yes Dettinger, Fransisca Kaufmann, MD  budesonide-formoterol (SYMBICORT) 160-4.5 MCG/ACT inhaler Inhale 2 puffs into the lungs 2 (two) times daily.    [provider]   calcitRIOL (ROCALTROL) 0.25 MCG capsule Take 1 capsule (0.25 mcg total) by mouth every morning. Patient not taking: Reported on 09/10/2019 11/18/18   Dettinger, Fransisca Kaufmann, MD  cyclobenzaprine (FLEXERIL) 10 MG tablet Take 1 tablet (10 mg total) by mouth 3 (three) times daily as needed for muscle spasms. Patient not taking: Reported on 09/10/2019 03/26/19   Dettinger, Fransisca Kaufmann, MD  iron polysaccharides (FERREX 150) 150 MG capsule Take 1 capsule (150 mg total) by mouth daily. (Needs to be seen) Patient not taking: Reported on 09/10/2019 08/16/18   Dettinger, Fransisca Kaufmann, MD  Multiple Vitamin (MULTIVITAMIN WITH MINERALS) TABS tablet Take 1 tablet by mouth at bedtime.     [provider]  Multiple Vitamins-Minerals (PRESERVISION AREDS 2 PO) Take 1 tablet by mouth 2 (two) times daily.     [provider]  multivitamin (RENA-VIT) TABS tablet Take 1 tablet by mouth at bedtime. Patient not taking: Reported on 09/10/2019 10/31/18   TatShanon Brow, MD  Nutritional Supplements (FEEDING SUPPLEMENT, NEPRO CARB STEADY,) LIQD Take 237 mLs by mouth 2 (two) times daily between meals. Patient not taking: Reported on 09/10/2019 10/31/18   Orson Eva, MD   Current Facility-Administered Medications  Medication Dose Route Frequency Provider Last Rate Last Dose  . acetaminophen (TYLENOL) tablet 325-650 mg  325-650 mg Oral Q6H PRN Daleen Bo, MD   650 mg at 09/11/19 1058  . atorvastatin (LIPITOR) tablet 20 mg  20 mg Oral QHS Daleen Bo, MD      . bisoprolol (ZEBETA) tablet 10 mg  10 mg Oral q morning - 10a Daleen Bo, MD      . Chlorhexidine Gluconate Cloth 2 % PADS 6 each  6 each Topical Q0600 Donato Heinz, MD      . clonazePAM Bobbye [place]) tablet 0.5 mg  0.5 mg Oral QHS Daleen Bo, MD      . diltiazem (CARDIZEM CD) 24 hr capsule 120 mg  120 mg Oral Daily Daleen Bo, MD      . Derrill Memo ON 09/12/2019] ferrous sulfate tablet 325 mg  325 mg Oral Q M,W,F Daleen Bo, MD      .  mometasone-formoterol (DULERA) 200-5 MCG/ACT inhaler 2 puff  2 puff Inhalation BID Daleen Bo, MD      . pantoprazole (PROTONIX) EC tablet 40 mg  40 mg Oral BID Daleen Bo, MD      . sevelamer carbonate (RENVELA) tablet 800 mg  800 mg Oral TID WC  Daleen Bo, MD      . terbinafine (LAMISIL) tablet 250 mg  250 mg Oral See admin instructions Daleen Bo, MD      . umeclidinium bromide (INCRUSE ELLIPTA) 62.5 MCG/INH 1 puff  1 puff Inhalation Daily Daleen Bo, MD      . warfarin (COUMADIN) tablet 4 mg  4 mg Oral Daily Daleen Bo, MD       Current Outpatient Medications  Medication Sig Dispense Refill  . acetaminophen (TYLENOL) 325 MG tablet Take 325-650 mg by mouth every 6 (six) hours as needed for moderate pain.     [name] albuterol (PROAIR HFA) 108 (90 Base) MCG/ACT inhaler Inhale 2 puffs into the lungs 2 (two) times daily.     [name] atorvastatin (LIPITOR) 20 MG tablet Take 1 tablet (20 mg total) by mouth daily. (Needs to be seen) (Patient taking differently: Take 20 mg by mouth at bedtime. ) 90 tablet 3  . bisoprolol (ZEBETA) 10 MG tablet TAKE (1) TABLET TWICE A DAY. (Patient taking differently: Take 10 mg by mouth every morning. ) 180 tablet 1  . clonazePAM (KLONOPIN) 1 MG tablet TAKE 1/2 TABLET AT BEDTIME (Patient taking differently: Take 0.5 mg by mouth at bedtime. ) 15 tablet 1  . diltiazem (CARDIZEM CD) 120 MG 24 hr capsule Take 1 capsule (120 mg total) by mouth daily. 90 capsule 3  . ferrous sulfate 325 (65 FE) MG tablet Take 325 mg by mouth every Monday, Wednesday, and Friday.    . fluticasone (FLONASE) 50 MCG/ACT nasal spray Place 2 sprays into both nostrils daily. (Patient taking differently: Place 2 sprays into both nostrils daily as needed for allergies. ) 16 g 6  . hydroxypropyl methylcellulose / hypromellose (ISOPTO TEARS / GONIOVISC) 2.5 % ophthalmic solution Place 1 drop into both eyes 2 (two) times daily as needed for dry eyes.     [name] lidocaine-prilocaine (EMLA) cream  Apply 1 application topically as needed. 30 g 0  . loratadine (CLARITIN) 10 MG tablet TAKE 1 TABLET DAILY 30 tablet 11  . Melatonin 3 MG TABS Take 3 mg by mouth at bedtime.     . Multiple Vitamins-Minerals (MENS ONE DAILY PO) Take 1 tablet by mouth daily.    . Multiple Vitamins-Minerals (PRESERVISION AREDS 2+MULTI VIT) CAPS Take 1 capsule by mouth 2 (two) times daily.    [name] omega-3 acid ethyl esters (LOVAZA) 1 g capsule Take 2 g by mouth 2 (two) times daily.    . Omega-3 Fatty Acids (FISH OIL) 1000 MG CAPS Take 2 capsules by mouth 2 (two) times daily.    . pantoprazole (PROTONIX) 40 MG tablet TAKE (1) TABLET TWICE A DAY. (Patient taking differently: Take 40 mg by mouth 2 (two) times daily. ) 180 tablet 0  . sevelamer carbonate (RENVELA) 800 MG tablet Take 800 mg by mouth 3 (three) times daily with meals.    . terbinafine (LAMISIL) 250 MG tablet Take it 3 times weekly on dialysis day after dialysis (Patient taking differently: Take 250 mg by mouth See admin instructions. Take 1 tablet daily on dialysis days - Tues, Thurs, Sat) 36 tablet 1  . tiotropium (SPIRIVA) 18 MCG inhalation capsule Place 18 mcg into inhaler and inhale daily.     [name] warfarin (COUMADIN) 2 MG tablet TAKE 2 TABLETS (4MG ) DAILY EXCEPT ON MONDAY AND THURS TAKE 2 & 1/2 (5MG ) (Patient taking differently: Take 4 mg by mouth daily. ) 70 tablet 0  . budesonide-formoterol (SYMBICORT) 160-4.5 MCG/ACT inhaler Inhale 2 puffs  into the lungs 2 (two) times daily.    . calcitRIOL (ROCALTROL) 0.25 MCG capsule Take 1 capsule (0.25 mcg total) by mouth every morning. (Patient not taking: Reported on 09/10/2019) 90 capsule 3  . cyclobenzaprine (FLEXERIL) 10 MG tablet Take 1 tablet (10 mg total) by mouth 3 (three) times daily as needed for muscle spasms. (Patient not taking: Reported on 09/10/2019) 30 tablet 0  . iron polysaccharides (FERREX 150) 150 MG capsule Take 1 capsule (150 mg total) by mouth daily. (Needs to be seen) (Patient not taking: Reported  on 09/10/2019) 90 capsule 3  . Multiple Vitamin (MULTIVITAMIN WITH MINERALS) TABS tablet Take 1 tablet by mouth at bedtime.     . Multiple Vitamins-Minerals (PRESERVISION AREDS 2 PO) Take 1 tablet by mouth 2 (two) times daily.     . multivitamin (RENA-VIT) TABS tablet Take 1 tablet by mouth at bedtime. (Patient not taking: Reported on 09/10/2019) 30 tablet 0  . Nutritional Supplements (FEEDING SUPPLEMENT, NEPRO CARB STEADY,) LIQD Take 237 mLs by mouth 2 (two) times daily between meals. (Patient not taking: Reported on 09/10/2019) 60 Can 0   Labs: Basic Metabolic Panel: Recent Labs  Lab 09/09/19 2124  NA 139  K 4.0  CL 96*  CO2 29  GLUCOSE 158*  BUN 34*  CREATININE 4.99*  CALCIUM 8.6*   Liver Function Tests: Recent Labs  Lab 09/09/19 2124  AST 14*  ALT 12  ALKPHOS 122  BILITOT 0.6  PROT 6.3*  ALBUMIN 2.7*   No results for input(s): LIPASE, AMYLASE in the last 168 hours. No results for input(s): AMMONIA in the last 168 hours. CBC: Recent Labs  Lab 09/09/19 2124  WBC 8.4  HGB 10.1*  HCT 32.9*  MCV 104.1*  PLT 268   Cardiac Enzymes: No results for input(s): CKTOTAL, CKMB, CKMBINDEX, TROPONINI in the last 168 hours. CBG: No results for input(s): GLUCAP in the last 168 hours. Iron Studies: No results for input(s): IRON, TIBC, TRANSFERRIN, FERRITIN in the last 72 hours. Studies/Results: Dg Chest 2 View  Result Date: 09/10/2019 CLINICAL DATA:  Involuntary commitment, medical clearance; history atrial fibrillation, COPD, hypertension, prostate cancer EXAM: CHEST - 2 VIEW COMPARISON:  02/26/2019 FINDINGS: Normal heart size, mediastinal contours, and pulmonary vascularity. Atherosclerotic calcification aorta. Emphysematous and minimal bronchitic changes consistent with COPD. Masslike opacity identified in LEFT lower lobe 6.5 x 5.3 x 7.0 cm concerning for pulmonary neoplasm, round pneumonia considered less likely. Associated infiltrate and/or atelectasis at LEFT base. Blunting  of the LEFT posterior costophrenic angle by tiny pleural effusion. No acute osseous findings. IMPRESSION: COPD changes with mild atelectasis versus infiltrate and tiny effusion at LEFT base. In addition, 7 cm diameter masslike opacity in LEFT lower lobe suspicious for pulmonary neoplasm; CT chest with contrast recommended for further assessment. Findings called to Dr. Eulis Foster on 07/11/2019 at 1009 hours. Electronically Signed   By: Lavonia Dana M.D.   On: 09/10/2019 10:14   Ct Chest W Contrast  Result Date: 09/10/2019 CLINICAL DATA:  Chest pain, lung nodule. EXAM: CT CHEST WITH CONTRAST TECHNIQUE: Multidetector CT imaging of the chest was performed during intravenous contrast administration. CONTRAST:  56mL OMNIPAQUE IOHEXOL 300 MG/ML  SOLN COMPARISON:  Mar 20, 2014. FINDINGS: Cardiovascular: Atherosclerosis of thoracic aorta is noted without aneurysm or dissection. Normal cardiac size. No pericardial effusion. Mediastinum/Nodes: No enlarged mediastinal, hilar, or axillary lymph nodes. Thyroid gland, trachea, and esophagus demonstrate no significant findings. Lungs/Pleura: No pneumothorax is noted. Minimal right posterior basilar subsegmental atelectasis or scarring is  noted. 6.8 x 5.2 cm left lower lobe rounded density is noted with hypodense center consistent with necrotic malignancy or cavitary pneumonia. There is associated atelectasis. Minimal left pleural effusion is noted. Upper Abdomen: Cholelithiasis is noted. Musculoskeletal: No chest wall abnormality. No acute or significant osseous findings. IMPRESSION: 6.8 x 5.2 cm left lower lobe rounded density is noted with hypodense center concerning for necrotic malignancy or less likely cavitary pneumonia. Bronchoscopy is recommended. There is mild surrounding atelectasis with minimal left pleural effusion. Aortic Atherosclerosis (ICD10-I70.0). Electronically Signed   By: Marijo Conception M.D.   On: 09/10/2019 13:19    ROS: A comprehensive review of systems  was negative except for: Constitutional: positive for fatigue and weakness Physical Exam: Vitals:   09/09/19 2100 09/10/19 0630 09/10/19 1004 09/11/19 0633  BP:  107/66 (!) 107/58 129/77  Pulse:  93 (!) 114 (!) 117  Resp:  18 20 15   Temp:   98.2 F (36.8 C) 99 F (37.2 C)  TempSrc:   Oral Oral  SpO2:  97% 96% 94%  Weight: 70.3 kg     Height: 5' 9.5" (1.765 m)         Weight change:  No intake or output data in the 24 hours ending 09/11/19 1211 BP 129/77 (BP Location: Right Arm)   Pulse (!) 117   Temp 99 F (37.2 C) (Oral)   Resp 15   Ht 5' 9.5" (1.765 m)   Wt 70.3 kg   SpO2 94%   BMI 22.56 kg/m  General appearance: fatigued and no distress Head: Normocephalic, without obvious abnormality, atraumatic Resp: clear to auscultation bilaterally Cardio: no rub GI: soft, non-tender; bowel sounds normal; no masses,  no organomegaly Extremities: extremities normal, atraumatic, no cyanosis or edema and LUE AVF +T/B Dialysis Access:  Dialysis Orders: Center: [facility]  on TTS . EDW 71kg HD Bath 2K/2.5Ca  Time 3.5 hours Heparin none. Access LUE AVF BFR 400 DFR 600    Hectoral 1.5 mcg IV/HD Epogen 3600  Units IV/HD   Assessment/Plan: 1.  LLL lesion with hypodense center- possible necrotic malignancy or cavitary pna.  Will need Pulmonary workup and bronch 2.  ESRD -  Cont with HD qTTS 3.  Hypertension/volume  -  stable 4.  Anemia  -  stable 5.  Metabolic bone disease -   Cont with home meds 6.  Nutrition -  Renal diet 7. Disposition- awaiting ALF placement by SW.   Donetta Potts, MD [place] Pager [phone] 09/11/2019, 12:11 PM

## 2019-09-11 NOTE — Progress Notes (Addendum)
CSW contacted charge RN at [facility] to request social work consult be placed for ALF placement, per Dr. Dwyane Dee.   Audree Camel, LCSW, [place] Disposition [facility] [phone]

## 2019-09-11 NOTE — ED Notes (Signed)
Pt moved to RM 1 for dialysis. Pt with dialysis RN at this time.

## 2019-09-11 NOTE — ED Notes (Signed)
Patient has been cleared by [facility], awaiting assistance living or nursing home place, son in to visit, states " Patient is dying and he wants to see him". Informed visitor he could stay for 30 minutes.

## 2019-09-11 NOTE — Discharge Instructions (Signed)
Continue your regular home medications.  Return to ER if you develop chest pain, difficulty breathing or other new concerning symptom.  Please return to ER if you have any thoughts of hurting herself or hurting others.

## 2019-09-11 NOTE — ED Notes (Signed)
Patient evaluated by PT, walked fine, may need home health, but not placement.  Patient is waiting to be dialyzed before going home with step-son.

## 2019-09-11 NOTE — Evaluation (Signed)
Physical Therapy Evaluation Patient Details Name: Don [name] MRN: [id number] DOB: October 18, 1941 Today's Date: 09/11/2019   History of Present Illness  Patient is a an 78 year old male brought by law enforcement for evaluation of suicidal ideation.  Patient made threats of harming himself by carbon monoxide poisoning to the triage nurse.  Apparently he has issues with family members and finances and tells me that he is "tired of living".    Clinical Impression  Patient functioning at baseline for functional mobility and gait.  Plan:  Patient discharged from physical therapy to care of nursing for ambulation daily as tolerated for length of stay.     Follow Up Recommendations Home health PT;Supervision - Intermittent    Equipment Recommendations  None recommended by PT    Recommendations for Other Services       Precautions / Restrictions Precautions Precautions: None Restrictions Weight Bearing Restrictions: No      Mobility  Bed Mobility Overal bed mobility: Modified Independent                Transfers Overall transfer level: Modified independent Equipment used: None                Ambulation/Gait Ambulation/Gait assistance: Modified independent (Device/Increase time) Gait Distance (Feet): 200 Feet(200) Assistive device: None Gait Pattern/deviations: WFL(Within Functional Limits) Gait velocity: slightly decreased   General Gait Details: good return for ambulation in room and hallways without loss of balance  Stairs            Wheelchair Mobility    Modified Rankin (Stroke Patients Only)       Balance Overall balance assessment: No apparent balance deficits (not formally assessed)                                           Pertinent Vitals/Pain Pain Assessment: 0-10 Pain Score: 4  Pain Location: left side of neck Pain Descriptors / Indicators: Aching;Sore Pain Intervention(s): Limited activity within patient's  tolerance;Monitored during session;Repositioned    Home Living Family/patient expects to be discharged to:: Private residence Living Arrangements: Alone Available Help at Discharge: Family;Available PRN/intermittently Type of Home: Mobile home Home Access: Ramped entrance     Home Layout: One level Home Equipment: [place] - 2 wheels;Cane - single point;Bedside commode;Shower seat;Wheelchair - manual;Hospital bed      Prior Function Level of Independence: Independent with assistive device(s)         Comments: Household and short distanced community ambulator with [facility] PRN     Hand Dominance        Extremity/Trunk Assessment   Upper Extremity Assessment Upper Extremity Assessment: Overall WFL for tasks assessed    Lower Extremity Assessment Lower Extremity Assessment: Overall WFL for tasks assessed    Cervical / Trunk Assessment Cervical / Trunk Assessment: Normal  Communication   Communication: No difficulties  Cognition Arousal/Alertness: Awake/alert Behavior During Therapy: WFL for tasks assessed/performed Overall Cognitive Status: Within Functional Limits for tasks assessed                                        General Comments      Exercises     Assessment/Plan    PT Assessment All further PT needs can be met in the next venue of care  PT Problem List  Pain;Other (comment)(left sided neck pain)       PT Treatment Interventions      PT Goals (Current goals can be found in the Care Plan section)  Acute Rehab PT Goals Patient Stated Goal: return home PT Goal Formulation: With patient Time For Goal Achievement: 09/04/19 Potential to Achieve Goals: Good    Frequency     Barriers to discharge        Co-evaluation               AM-PAC PT "6 Clicks" Mobility  Outcome Measure Help needed turning from your back to your side while in a flat bed without using bedrails?: None Help needed moving from lying on your back to  sitting on the side of a flat bed without using bedrails?: None Help needed moving to and from a bed to a chair (including a wheelchair)?: None Help needed standing up from a chair using your arms (e.g., wheelchair or bedside chair)?: None Help needed to walk in hospital room?: None Help needed climbing 3-5 steps with a railing? : None 6 Click Score: 24    End of Session   Activity Tolerance: Patient tolerated treatment well Patient left: in bed;with call bell/phone within reach;with nursing/sitter in room Nurse Communication: Mobility status PT Visit Diagnosis: Unsteadiness on feet (R26.81);Other abnormalities of gait and mobility (R26.89);Muscle weakness (generalized) (M62.81)    Time: 2446-2863 PT Time Calculation (min) (ACUTE ONLY): 21 min   Charges:   PT Evaluation $PT Eval Moderate Complexity: 1 Mod PT Treatments $Therapeutic Activity: 8-22 mins        2:43 PM, 09/11/19 Lonell Grandchild, MPT Physical Therapist with [facility] [phone] office [phone] mobile phone

## 2019-09-11 NOTE — BHH Counselor (Signed)
  REASSESSMENT  [facility] reevaluated pt for safety and stability.  Pt was observed laying in the bed, alert, and oriented x2.  Pt states, "I don't feel too good."  Pt started complaining of medical complications including his dialysis treatment.  Pt states, "I'm tired of living with all of this pain.  What are you going to do to help me with this?  I'm trying to get into [facility], but I have to get rid of everything."  [facility] asked pt if he was suicidal?  Pt responded, "no, I don't believe in that. I'm just tired of living like this.  This pain is too much to deal with.  I just need some help."  Pt denies HI/A/V-hallucinations.  Disposition:  [facility] consulted with [place] Provider, Mordecai Maes, NP who recommends CSW to follow up with pt's nurse to possibly consider a social work consult.  Ladawn Boullion L. Carlos, [place], [facility], [facility] Therapeutic Triage Specialist  [phone]

## 2019-09-11 NOTE — ED Notes (Signed)
Family member is visting with patient

## 2019-09-11 NOTE — Progress Notes (Signed)
Patient ID: WENDLE KINA, male   DOB: July 12, 1941, 78 y.o.   MRN: [id number]   Reassessment  In brief;  Don [name] is an 78 y.o. male presenting with SI with plan to get a piece of water hose, cut it and tape it to the muffler of the car and run it in the window and crank the car. Patient reported onset of SI with plan was recently after focusing on his quality of life, regarding medical and not able to do certain things anymore, like driving. Patient receives dialysis. Patient reported "I feel like a burden, I am tired and burnt out, I have too much on my mind". Patient reported "I don't want to do it but I might". Patient reported hearing knocking on the door and nobody is there. Patient reported no history of SI, inpatient, SI attempts or self harming behaviors. Patient reported increased depressive symptoms.   During this evaluation, patient is alert and oriented x4, calm and cooperative. Patient denies suicidal thoughts or homicidal ideas. He does report that yesterday he had SI with the plan as noted above although again, he defies any SI with plan or intent. He states, " I am just tired of dealing with these health issues and nobody is helping me." He states," when I say I am tired, I am not going to kill myself because I don't believe in that. I just want some help." He reports he needs help with things around the house such as cooking and taking his medications. Reports he would accepted going to an [place] or a caregiver coming into the home. He reports that at times, he here people knocking at his door although states," it could just be me." He denies other psychosis and does not appear internally preoccupied. He denies any other issues at this time.  Don [name] gave me verbal consent to speak with his [facility]). As per Don [name], he has been waiting to speak to someone. He states," if someone had talked to me sooner, he wouldn't even be there." He reports  that his father likes to go to the hospital as he believes it is a vacation. Reports that his father has never used the word suicidal, although will say," I am tired" and he knows that mean he is tired because he has had dialysis which makes him fatigued or tired of his medical conditions. Reports that he does not believe that his father is a danger to himself. Reports that his father goes to the [place], and the [place] has said in the past that he pretty much says the same thing, " tired of his health issues" although they have never felt that he was a danger to himself. Reports that his father states he thinks he has, " a little dementia" although reports he does not believe this is true. Reports his father went to rehab in the past and when he mentions an Assisted Living, he think it will be like rehab and that someone will take care of him hand and foot. Reports he had his father in and Adult Day Care once and after one day, he stopped going. Reports even if he does go to an [place], he believes his father will stay for a few weeks then be ready to return home. Reports his father has a private caregiver coming into the home 2 days per week for light housekeeping and cooking. Reports he is willing to increase those times if  needed. Reports he does not believe that his father requires mental health hospitalizations although he is open to additional resources for caregivers and Assisted Living Facilites.   Based on today's evaluation and additional information collected from patients father, I do not see the need for inpatient, mental health psychiatric hospitalizations. We have requested a social work consult with hopes to provide additional resources for caregivers and Assisted Living Facilities. Patients son stated he is open to revisit the need for additional in home caregiver assistance. We discussed a safety plan and patients son was educated about removing/locking any firearms, medications or  dangerous products from the home. I advised son that it would be more appropriate for patients caregiver to give patient his medication. I advised patients son that he would be psychiatrically cleared.   EDP Dr. Eulis Foster updated on current disposition.

## 2019-09-11 NOTE — TOC Initial Note (Signed)
Transition of Care [facility]) - Initial/Assessment Note    Patient Details  Name: Don [name] MRN: [id number] Date of Birth: 1941/02/28  Transition of Care [facility]) CM/SW Contact:    Ihor Gully, LCSW Phone Number: 09/11/2019, 4:34 PM  Clinical Narrative:                 Patient from home alone. In ED for psych. Psych cleared. Family wants SNF. PT eval recommended HHPT. Son, [name], is agreeable to [facility]. Choices provided. Agency that is available to take [facility] is [facility]. [name] is also agreeable to palliative care. Referral made to Doreatha Lew with [employer].  TOC signing off.   Expected Discharge Plan: [place] Barriers to Discharge: Continued Medical Work up(Patient will discharge after dialysis today.)   Patient Goals and CMS Choice Patient states their goals for this hospitalization and ongoing recovery are:: To return home.   Choice offered to / list presented to : Adult Children  Expected Discharge Plan and Services Expected Discharge Plan: [place] Choice: Home Health(Palliative Care) Living arrangements for the past 2 months: Single Family Home                   DME [facility]       [place]: PT [place]: [facility] [employer] [facility] [employer] Date [place]: 09/11/19 Time [place]: [phone] Representative spoke with at [place]: Drue Novel  Prior Living Arrangements/Services Living arrangements for the past 2 months: [place] with:: Self Patient language and need for interpreter reviewed:: Yes Do you feel safe going back to the place where you live?: Yes      Need for Family Participation in Patient Care: Yes (Comment) Care giver support system in place?: Yes (comment) Current home services: DME Criminal Activity/Legal Involvement Pertinent to Current Situation/Hospitalization: No - Comment as  needed  Activities of Daily Living   ADL Screening (condition at time of admission) Patient's cognitive ability adequate to safely complete daily activities?: Yes Patient able to express need for assistance with ADLs?: Yes Independently performs ADLs?: Yes (appropriate for developmental age)  Permission Sought/Granted Permission sought to share information with : Family Supports Permission granted to share information with : Yes, Release of Information Signed        Permission granted to share info w Relationship: sons, Joneen Caraway and [name]     Emotional Assessment Appearance:: Appears stated age   Affect (typically observed): Unable to Assess Orientation: : Oriented to Self, Oriented to Place, Oriented to  Time, Oriented to Situation Alcohol / Substance Use: Not Applicable Psych Involvement: No (comment)  Admission diagnosis:  Medical Clearance  Patient Active Problem List   Diagnosis Date Noted  . ESRD (end stage renal disease) ([place]) 10/30/2018  . COPD with acute exacerbation ([place]) 10/30/2018  . Acute on chronic diastolic CHF (congestive heart failure) ([place]) 10/30/2018  . Hemodialysis patient ([place])   . Acute renal failure superimposed on stage 5 chronic kidney disease, not on chronic dialysis ([place])   . Avulsion of skin of right forearm   . Fall   . Palliative care by specialist   . DNR (do not resuscitate) discussion   . Atrial fibrillation with RVR ([place]) 03/18/2018  . Mass of lower lobe of left lung 02/12/2018  . Depression, recurrent ([place]) 01/02/2018  . Acute on chronic anemia   .  Normocytic anemia   . Coagulopathy ([place]) 01/31/2017  . Pancytopenia ([place]) 01/31/2017  . Pre-diabetes 07/23/2015  . Metabolic syndrome 07/68/0881  . Urinary bladder incontinence 03/04/2015  . Hyperlipidemia LDL goal <130 04/04/2014  . Personal history of colonic polyps 04/04/2014  . Heme positive stool 04/03/2014  . History of prostate cancer 04/03/2014  . Tobacco abuse 04/25/2013  .  Prostate cancer ([place]) 02/08/2011  . COPD (chronic obstructive pulmonary disease) ([place]) 02/08/2011  . Hypertension 02/08/2011   PCP:  Dettinger, Fransisca Kaufmann, MD Pharmacy:   [facility] [place] [address] Phone: [phone] Fax: [phone]     Social Determinants of Health (SDOH) Interventions    Readmission Risk Interventions No flowsheet data found.

## 2019-09-11 NOTE — ED Notes (Addendum)
Dialysis complete per Levada Dy, RN- Dr Roslynn Amble notified and aware that medications ordered earlier were held d/t dialysis. A/w instructions to get meds from pharmacy or d/c and allow pt to take when he gets home. Pt son called to inform pt is being discharged and ready for transport.

## 2019-09-11 NOTE — ED Notes (Addendum)
Spoke with Joneen Caraway, pt son who says he will be here in 30 minutes to pick up pt. Two bags removed from [facility] locker room with clothes, watch, shoes, cane. Pt is being assisted with getting dressed at this time. Ok to discontinue medication per Dr Roslynn Amble- pt can take at home- will inform son of this when he arrives.

## 2019-09-11 NOTE — ED Notes (Signed)
TTS machine set up for assessment by Crystal at [facility].

## 2019-09-11 NOTE — Procedures (Signed)
    HEMODIALYSIS TREATMENT NOTE:  3.5 hour heparin-free dialysis completed via left upper arm AVG (15g ante/retrograde).  No fluid removed; kept even as pt was under EDW with soft BPs.  All blood was returned and hemostasis was achieved in 15 minutes.  Rockwell Alexandria, RN

## 2019-09-12 ENCOUNTER — Telehealth: Payer: Self-pay | Admitting: Genetic Counselor

## 2019-09-12 NOTE — Telephone Encounter (Signed)
Called patient regarding upcoming [facility] appointment, per patient's request this will be a walk-in visit. °

## 2019-09-15 ENCOUNTER — Inpatient Hospital Stay: Payer: Medicare HMO | Attending: Genetic Counselor | Admitting: Genetic Counselor

## 2019-09-15 ENCOUNTER — Encounter: Payer: Self-pay | Admitting: Genetic Counselor

## 2019-09-15 ENCOUNTER — Other Ambulatory Visit: Payer: Self-pay | Admitting: Genetic Counselor

## 2019-09-15 ENCOUNTER — Inpatient Hospital Stay: Payer: Medicare HMO

## 2019-09-15 ENCOUNTER — Other Ambulatory Visit: Payer: Self-pay

## 2019-09-15 DIAGNOSIS — I10 Essential (primary) hypertension: Secondary | ICD-10-CM | POA: Insufficient documentation

## 2019-09-15 DIAGNOSIS — I4891 Unspecified atrial fibrillation: Secondary | ICD-10-CM | POA: Diagnosis not present

## 2019-09-15 DIAGNOSIS — F419 Anxiety disorder, unspecified: Secondary | ICD-10-CM | POA: Insufficient documentation

## 2019-09-15 DIAGNOSIS — Z9079 Acquired absence of other genital organ(s): Secondary | ICD-10-CM | POA: Insufficient documentation

## 2019-09-15 DIAGNOSIS — C61 Malignant neoplasm of prostate: Secondary | ICD-10-CM

## 2019-09-15 DIAGNOSIS — Z8546 Personal history of malignant neoplasm of prostate: Secondary | ICD-10-CM | POA: Diagnosis not present

## 2019-09-15 DIAGNOSIS — N186 End stage renal disease: Secondary | ICD-10-CM | POA: Insufficient documentation

## 2019-09-15 DIAGNOSIS — Z992 Dependence on renal dialysis: Secondary | ICD-10-CM | POA: Insufficient documentation

## 2019-09-15 DIAGNOSIS — Z85118 Personal history of other malignant neoplasm of bronchus and lung: Secondary | ICD-10-CM | POA: Insufficient documentation

## 2019-09-15 DIAGNOSIS — E119 Type 2 diabetes mellitus without complications: Secondary | ICD-10-CM | POA: Insufficient documentation

## 2019-09-15 DIAGNOSIS — I132 Hypertensive heart and chronic kidney disease with heart failure and with stage 5 chronic kidney disease, or end stage renal disease: Secondary | ICD-10-CM | POA: Diagnosis not present

## 2019-09-15 DIAGNOSIS — D126 Benign neoplasm of colon, unspecified: Secondary | ICD-10-CM

## 2019-09-15 DIAGNOSIS — I5033 Acute on chronic diastolic (congestive) heart failure: Secondary | ICD-10-CM | POA: Diagnosis not present

## 2019-09-15 DIAGNOSIS — Z87891 Personal history of nicotine dependence: Secondary | ICD-10-CM | POA: Insufficient documentation

## 2019-09-15 DIAGNOSIS — R079 Chest pain, unspecified: Secondary | ICD-10-CM | POA: Diagnosis not present

## 2019-09-15 DIAGNOSIS — Z7951 Long term (current) use of inhaled steroids: Secondary | ICD-10-CM | POA: Insufficient documentation

## 2019-09-15 DIAGNOSIS — Z79899 Other long term (current) drug therapy: Secondary | ICD-10-CM | POA: Insufficient documentation

## 2019-09-15 DIAGNOSIS — J449 Chronic obstructive pulmonary disease, unspecified: Secondary | ICD-10-CM | POA: Diagnosis not present

## 2019-09-15 DIAGNOSIS — H3530 Unspecified macular degeneration: Secondary | ICD-10-CM | POA: Diagnosis not present

## 2019-09-15 DIAGNOSIS — E785 Hyperlipidemia, unspecified: Secondary | ICD-10-CM | POA: Insufficient documentation

## 2019-09-15 DIAGNOSIS — Z7901 Long term (current) use of anticoagulants: Secondary | ICD-10-CM | POA: Insufficient documentation

## 2019-09-15 DIAGNOSIS — F0390 Unspecified dementia without behavioral disturbance: Secondary | ICD-10-CM | POA: Insufficient documentation

## 2019-09-15 DIAGNOSIS — G8929 Other chronic pain: Secondary | ICD-10-CM | POA: Diagnosis not present

## 2019-09-15 DIAGNOSIS — Z801 Family history of malignant neoplasm of trachea, bronchus and lung: Secondary | ICD-10-CM | POA: Insufficient documentation

## 2019-09-15 DIAGNOSIS — C3432 Malignant neoplasm of lower lobe, left bronchus or lung: Secondary | ICD-10-CM | POA: Insufficient documentation

## 2019-09-15 DIAGNOSIS — Z8249 Family history of ischemic heart disease and other diseases of the circulatory system: Secondary | ICD-10-CM | POA: Insufficient documentation

## 2019-09-15 NOTE — Progress Notes (Signed)
REFERRING PROVIDER: Ria Clock, PA-C [address],  [place] [address]  PRIMARY PROVIDER:  Dettinger, Fransisca Kaufmann, MD  PRIMARY REASON FOR VISIT:  1. History of prostate cancer   2. Polyposis coli      HISTORY OF PRESENT ILLNESS:   Don [name], a 78 y.o. male, was seen for a [facility] cancer genetics consultation at the request of Dr. Sarita Haver due to a personal and family history of cancer, and personal history of polyposis.  Don [name] presents to clinic today to discuss the possibility of a hereditary predisposition to cancer, genetic testing, and to further clarify his future cancer risks, as well as potential cancer risks for family members.   In 2003, at the age of 71, Don [name] was diagnosed with prostate cancer. The treatment plan included radiation.  In 2018, at the age of 56, Don [name] was diagnosed with 59 colon polyps.  Lastly, October 27 he was seen through the ED and a lung mass was found.  He is being worked up for lung cancer.    CANCER HISTORY:  Oncology History   No history exists.      Past Medical History:  Diagnosis Date  . Acute bronchitis 04/03/2014  . Anemia    low iron  . Anxiety   . Atrial fibrillation ([facility])   . Cataract   . COPD (chronic obstructive pulmonary disease) ([facility])   . Dementia ([place])   . Depression   . Essential hypertension   . Hyperlipidemia   . Macular degeneration   . Noncompliance with medications 04/2013   Xarelto, digoxin previously  . Polyposis coli   . Pre-diabetes   . Prostate cancer ([place])   . 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 Tubular adenoma of colon 07/31/02, 11/18/03    Past Surgical History:  Procedure Laterality Date  . A/V FISTULAGRAM Left 05/30/2019   Procedure: A/V FISTULAGRAM;  Surgeon: Elam Dutch, MD;  Location: [facility];  Service: Cardiovascular;  Laterality: Left;  . Anal abcess,Hemorroids,    . AV FISTULA PLACEMENT Left  06/23/2019   Procedure: INSERTION OF ARTERIOVENOUS (AV) GORE-TEX GRAFT ARM;  Surgeon: Elam Dutch, MD;  Location: [facility];  Service: Vascular;  Laterality: Left;  . BASCILIC VEIN TRANSPOSITION Left 10/08/2018   Procedure: FIRST STAGE BASILIC VEIN TRANSPOSITION LEFT ARM;  Surgeon: Angelia Mould, MD;  Location: [facility];  Service: Vascular;  Laterality: Left;  . BASCILIC VEIN TRANSPOSITION Left 12/16/2018   Procedure: BASILIC VEIN TRANSPOSITION SECOND STAGE;  Surgeon: Angelia Mould, MD;  Location[facility];  Service: Vascular;  Laterality: Left;  . COLONOSCOPY N/A 04/05/2014   Dr. Fuller Plan: 6 mm sessile polyp from sigmoid colon (tubular adenoma), internal hemorrhoids  . COLONOSCOPY W/ BIOPSIES  11/18/2003   Dr. Earle Gell  . ESOPHAGOGASTRODUODENOSCOPY  11/18/2003   Dr. Earle Gell  . ESOPHAGOGASTRODUODENOSCOPY N/A 04/05/2014   Dr. Fuller Plan: variable Z line, negative Barrett's, small hiatal hernia  . ESOPHAGOGASTRODUODENOSCOPY N/A 02/02/2017   Dr. Oneida Alar: LA Grade B esophagitis, one moderate benign-appearing intrinsic stenosis traversed, small non-bleeding diverticulum in second portion of duodenum, reactive gastropathy, no H.pylori.  . ESOPHAGOGASTRODUODENOSCOPY N/A 03/22/2018   Procedure: ESOPHAGOGASTRODUODENOSCOPY (EGD);  Surgeon: Daneil Dolin, MD;  [facility];  Service: Endoscopy;  Laterality: N/A;  . GIVENS CAPSULE STUDY N/A 03/22/2018   Procedure: GIVENS CAPSULE STUDY;  Surgeon: Daneil Dolin, MD;  [facility];  Service: Endoscopy;  Laterality: N/A;  . IR FLUORO GUIDE CV LINE RIGHT  10/29/2018  . IR REMOVAL TUN CV CATH W/O FL  08/06/2019  . IR US GUIDE VASC ACCESS RIGHT  10/29/2018  . PERIPHERAL VASCULAR BALLOON ANGIOPLASTY Left 03/14/2019   Procedure: PERIPHERAL VASCULAR BALLOON ANGIOPLASTY;  Surgeon: Angelia Mould, MD;  Location: [facility];  Service: Cardiovascular;  Laterality: Left;  . PERIPHERAL VASCULAR BALLOON ANGIOPLASTY   05/30/2019   Procedure: PERIPHERAL VASCULAR BALLOON ANGIOPLASTY;  Surgeon: Elam Dutch, MD;  Location: [facility];  Service: Cardiovascular;;  left arm fistula  . RETROPUBIC PROSTATECTOMY  11/26/2001  . TONSILLECTOMY      Social History   Socioeconomic History  . Marital status: Married    Spouse name: Not on file  . Number of children: 1  . Years of education: Not on file  . Highest education level: Not on file  Occupational History  . Occupation: retired  Scientific laboratory technician  . Financial resource strain: Not very hard  . Food insecurity    Worry: Never true    Inability: Never true  . Transportation needs    Medical: No    Non-medical: No  Tobacco Use  . Smoking status: Former Smoker    Packs/day: 0.50    Years: 62.00    Pack years: 31.00    Types: Cigarettes, Cigars    Quit date: 10/27/2018    Years since quitting: 0.8  . Smokeless tobacco: Never Used  . Tobacco comment: "very little"  Substance and Sexual Activity  . Alcohol use: No  . Drug use: No  . Sexual activity: Not Currently  Lifestyle  . Physical activity    Days per week: 0 days    Minutes per session: 0 min  . Stress: Not at all  Relationships  . Social Herbalist on phone: Never    Gets together: Once a week    Attends religious service: Never    Active member of club or organization: No    Attends meetings of clubs or organizations: Never    Relationship status: Widowed  Other Topics Concern  . 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 His wife is in a SNF and he lives alone. He has help from his stepson (POA) and his daughter-in-law.       FAMILY HISTORY:  We obtained a detailed, 4-generation family history.  Significant diagnoses are listed below: Family History  Problem Relation Age of Onset  . Diabetes Father   . Heart disease Father 79       MI  . Heart attack Father   . Heart attack Mother   . Heart  disease Brother   . Cancer Brother        lung  . Lung cancer Brother   . Heart disease Brother   . Cancer Brother        possibly riddled with cancer  . Lung cancer Sister   . Cancer Sister        lung  . Heart disease Brother   . Lung cancer Brother   . Other Brother 42       accident  . Heart disease Brother   . Lung cancer Nephew     The patient has one son who is cancer free.  He has four brothers and one sister.  His sister and two brothers were  smokers and died of lung cancer.  One of these brothers had a son with lung cancer.  One brother had heart disease, but the patient thinks he also had cancer, and the fourth brother died of an accident at 95.  [name] parents are deceased from heart disease.  There is no reported cancer in either the maternal or paternal family history.  Don [name] is unaware of previous family history of genetic testing for hereditary cancer risks. Patient's maternal ancestors are of Caucasian descent, and paternal ancestors are of Caucasian descent. There is no reported Ashkenazi Jewish ancestry. There is no known consanguinity.  GENETIC COUNSELING ASSESSMENT: Don [name] is a 78 y.o. male with a personal history of polyposis and personal history of prostate cancer and family history of lung cancer which is somewhat suggestive of a hereditary cancer syndrome and predisposition to cancer given the number of colon polyps the patient has. We, therefore, discussed and recommended the following at today's visit.   DISCUSSION: We discussed that many people have colon polyps, with most having fewer than 5.  When an individual has more than 10 polyps it raises a concern that there is a hereditary cancer syndrome running in the family.  We reviewed that he has several siblings and a nephew with lung cancer, and he was recently diagnosed with a lung mass.  There is one gene called EGFR that is associated with hereditary lung cancer.    We discussed that  testing is beneficial for several reasons including knowing how to follow individuals after completing their treatment, and understand if other family members could be at risk for cancer and allow them to undergo genetic testing.   We reviewed the characteristics, features and inheritance patterns of hereditary cancer syndromes. We also discussed genetic testing, including the appropriate family members to test, the process of testing, insurance coverage and turn-around-time for results. We discussed the implications of a negative, positive, carrier and/or variant of uncertain significant result. We recommended Don [name] pursue genetic testing for the CustomNext-Cancer+RNAinsight gene panel. The CustomNext-Cancer gene panel offered by [facility] and includes sequencing and rearrangement analysis for the following 91 genes: AIP, ALK, APC*, ATM*, AXIN2, BAP1, BARD1, BLM, BMPR1A, BRCA1*, BRCA2*, BRIP1*, CDC73, CDH1*, CDK4, CDKN1B, CDKN2A, CHEK2*, CTNNA1, DICER1, FANCC, FH, FLCN, GALNT12, KIF1B, LZTR1, MAX, MEN1, MET, MLH1*, MRE11A, MSH2*, MSH3, MSH6*, MUTYH*, NBN, NF1*, NF2, NTHL1, PALB2*, PHOX2B, PMS2*, POT1, PRKAR1A, PTCH1, PTEN*, RAD50, RAD51C*, RAD51D*, RB1, RECQL, RET, SDHA, SDHAF2, SDHB, SDHC, SDHD, SMAD4, SMARCA4, SMARCB1, SMARCE1, STK11, SUFU, TMEM127, TP53*, TSC1, TSC2, VHL and XRCC2 (sequencing and deletion/duplication); CASR, CFTR, CPA1, CTRC, EGFR, EGLN1, FAM175A, HOXB13, KIT, MITF, MLH3, PALLD, PDGFRA, POLD1, POLE, PRSS1, RINT1, RPS20, SPINK1 and TERT (sequencing only); EPCAM and GREM1 (deletion/duplication only). DNA and RNA analyses performed for * genes.   Based on Don [name] personal and family history of cancer and personal history of polyposis, he meets medical criteria for genetic testing. Despite that he meets criteria, he may still have an out of pocket cost. We discussed that if his out of pocket cost for testing is over $100, the laboratory will call and confirm whether he wants to  proceed with testing.  If the out of pocket cost of testing is less than $100 he will be billed by the genetic testing laboratory.   PLAN: After considering the risks, benefits, and limitations, Don [name] provided informed consent to pursue genetic testing and the blood sample was sent to [facility] for analysis of the CustomNext-Cancer+RNAinsight. Results  should be available within approximately 2-3 weeks' time, at which point they will be disclosed by telephone to Don [name], as will any additional recommendations warranted by these results. Don [name] will receive a summary of his genetic counseling visit and a copy of his results once available. This information will also be available in Epic.   Lastly, we encouraged Don [name] to remain in contact with cancer genetics annually so that we can continuously update the family history and inform him of any changes in cancer genetics and testing that may be of benefit for this family.   Don [name] questions were answered to his satisfaction today. Our contact information was provided should additional questions or concerns arise. Thank you for the referral and allowing [place] to share in the care of your patient.   Senna Lape P. Florene Glen, [place], [facility], [facility] Licensed, Insurance risk surveyor [email or web address] phone: [phone]  The patient was seen for a total of 60 minutes in face-to-face genetic counseling.  This patient was discussed with Drs. Magrinat, Lindi Adie and/or Burr Medico who agrees with the above.    _______________________________________________________________________ For Office Staff:  Number of people involved in session: 1 Was an Intern/ student involved with case: yes: Delorise Royals

## 2019-09-16 ENCOUNTER — Telehealth: Payer: Self-pay | Admitting: *Deleted

## 2019-09-16 DIAGNOSIS — Z7901 Long term (current) use of anticoagulants: Secondary | ICD-10-CM | POA: Diagnosis not present

## 2019-09-16 DIAGNOSIS — I480 Paroxysmal atrial fibrillation: Secondary | ICD-10-CM | POA: Diagnosis not present

## 2019-09-16 NOTE — Telephone Encounter (Signed)
09/16/2019  Goal INR 2.0-3.0 INR today is 3.0  better Current:  4 mg daily  Continue dose, recheck next Wednesday so it can fall on Dettinger being in the office  Terald Sleeper PA-C [place] [address]  [place], [place] [address] [phone]

## 2019-09-16 NOTE — Telephone Encounter (Signed)
Fax received mdINR PT/INR self testing service Test date/time 08/16/19 10:01 am INR 3.0

## 2019-09-16 NOTE — Telephone Encounter (Signed)
Son aware and verbalized understanding

## 2019-09-17 ENCOUNTER — Encounter: Payer: Self-pay | Admitting: Pulmonary Disease

## 2019-09-17 ENCOUNTER — Other Ambulatory Visit: Payer: Self-pay

## 2019-09-17 ENCOUNTER — Ambulatory Visit (INDEPENDENT_AMBULATORY_CARE_PROVIDER_SITE_OTHER): Payer: Medicare HMO | Admitting: Pulmonary Disease

## 2019-09-17 VITALS — BP 110/76 | HR 110 | Temp 97.5°F | Ht 69.5 in | Wt 159.6 lb

## 2019-09-17 DIAGNOSIS — R918 Other nonspecific abnormal finding of lung field: Secondary | ICD-10-CM | POA: Diagnosis not present

## 2019-09-17 DIAGNOSIS — Z87891 Personal history of nicotine dependence: Secondary | ICD-10-CM

## 2019-09-17 DIAGNOSIS — R911 Solitary pulmonary nodule: Secondary | ICD-10-CM

## 2019-09-17 NOTE — H&P (View-Only) (Signed)
Synopsis: Referred in Oct 2020 for Lung mass by Dettinger, Fransisca Kaufmann, MD  Subjective:   PATIENT ID: Don [name] GENDER: male DOB: [birth year], MRN: [id number]  Chief Complaint  Patient presents with  . Consult    Consult for lung mass    This is an 78 year old gentleman with a past medical history of atrial fibrillation on Coumadin, COPD longstanding history of smoking quit smoking 9 months ago, hypertension, end-stage renal disease on dialysis Tuesday Thursday Saturday.  Patient was recently seen in the [facility] emergency room for chest pains as well as suicidal ideations.  Patient had a CT scan of the chest which revealed a left lower lobe necrotic appearing mass that measures 6.8 x 5.2 cm.  Patient was referred to our clinic for evaluation for bronchoscopy and tissue diagnosis.  Patient states he has had episodes of hemoptysis and has noted a few flecks of dark blood at times.  Otherwise respiratory symptoms are stable.  He does not have any significant shortness of breath.  He does have a daily cough.  Some sputum production.  Does feel significantly fatigued and may have lost some weight.   Past Medical History:  Diagnosis Date  . Acute bronchitis 04/03/2014  . Anemia    low iron  . Anxiety   . Atrial fibrillation ([place])   . Cataract   . COPD (chronic obstructive pulmonary disease) ([place])   . Dementia ([place])   . Depression   . Essential hypertension   . Hyperlipidemia   . Macular degeneration   . Noncompliance with medications 04/2013   Xarelto, digoxin previously  . Polyposis coli   . Pre-diabetes   . Prostate cancer ([place])   . 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 Tubular adenoma of colon 07/31/02, 11/18/03     Family History  Problem Relation Age of Onset  . Diabetes Father   . Heart disease Father 32       MI  . Heart attack Father   . Heart attack Mother   . Heart disease Brother   . Cancer Brother        lung  . Lung  cancer Brother   . Heart disease Brother   . Cancer Brother        possibly riddled with cancer  . Lung cancer Sister   . Cancer Sister        lung  . Heart disease Brother   . Lung cancer Brother   . Other Brother 52       accident  . Heart disease Brother   . Lung cancer Nephew      Past Surgical History:  Procedure Laterality Date  . A/V FISTULAGRAM Left 05/30/2019   Procedure: A/V FISTULAGRAM;  Surgeon: Elam Dutch, MD;  Location: [place] [facility];  Service: Cardiovascular;  Laterality: Left;  . Anal abcess,Hemorroids,    . AV FISTULA PLACEMENT Left 06/23/2019   Procedure: INSERTION OF ARTERIOVENOUS (AV) GORE-TEX GRAFT ARM;  Surgeon: Elam Dutch, MD;  Location: [place];  Service: Vascular;  Laterality: Left;  . BASCILIC VEIN TRANSPOSITION Left 10/08/2018   Procedure: FIRST STAGE BASILIC VEIN TRANSPOSITION LEFT ARM;  Surgeon: Angelia Mould, MD;  Location: [place];  Service: Vascular;  Laterality: Left;  . BASCILIC VEIN TRANSPOSITION Left 12/16/2018   Procedure: BASILIC VEIN TRANSPOSITION SECOND STAGE;  Surgeon: Angelia Mould, MD;  Location: [place];  Service: Vascular;  Laterality: Left;  .  COLONOSCOPY N/A 04/05/2014   Dr. Fuller Plan: 6 mm sessile polyp from sigmoid colon (tubular adenoma), internal hemorrhoids  . COLONOSCOPY W/ BIOPSIES  11/18/2003   Dr. Earle Gell  . ESOPHAGOGASTRODUODENOSCOPY  11/18/2003   Dr. Earle Gell  . ESOPHAGOGASTRODUODENOSCOPY N/A 04/05/2014   Dr. Fuller Plan: variable Z line, negative Barrett's, small hiatal hernia  . ESOPHAGOGASTRODUODENOSCOPY N/A 02/02/2017   Dr. Oneida Alar: LA Grade B esophagitis, one moderate benign-appearing intrinsic stenosis traversed, small non-bleeding diverticulum in second portion of duodenum, reactive gastropathy, no H.pylori.  . ESOPHAGOGASTRODUODENOSCOPY N/A 03/22/2018   Procedure: ESOPHAGOGASTRODUODENOSCOPY (EGD);  Surgeon: Daneil Dolin, MD;  [facility];  Service: Endoscopy;  Laterality:  N/A;  . GIVENS CAPSULE STUDY N/A 03/22/2018   Procedure: GIVENS CAPSULE STUDY;  Surgeon: Daneil Dolin, MD;  [facility];  Service: Endoscopy;  Laterality: N/A;  . IR FLUORO GUIDE CV LINE RIGHT  10/29/2018  . IR REMOVAL TUN CV CATH W/O FL  08/06/2019  . IR US GUIDE VASC ACCESS RIGHT  10/29/2018  . PERIPHERAL VASCULAR BALLOON ANGIOPLASTY Left 03/14/2019   Procedure: PERIPHERAL VASCULAR BALLOON ANGIOPLASTY;  Surgeon: Angelia Mould, MD;  Location: [place] [facility];  Service: Cardiovascular;  Laterality: Left;  . PERIPHERAL VASCULAR BALLOON ANGIOPLASTY  05/30/2019   Procedure: PERIPHERAL VASCULAR BALLOON ANGIOPLASTY;  Surgeon: Elam Dutch, MD;  Location: [place] [facility];  Service: Cardiovascular;;  left arm fistula  . RETROPUBIC PROSTATECTOMY  11/26/2001  . TONSILLECTOMY      Social History   Socioeconomic History  . Marital status: Married    Spouse name: Not on file  . Number of children: 1  . Years of education: Not on file  . Highest education level: Not on file  Occupational History  . Occupation: retired  Scientific laboratory technician  . Financial resource strain: Not very hard  . Food insecurity    Worry: Never true    Inability: Never true  . Transportation needs    Medical: No    Non-medical: No  Tobacco Use  . Smoking status: Former Smoker    Packs/day: 0.50    Years: 62.00    Pack years: 31.00    Types: Cigarettes, Cigars    Quit date: 10/27/2018    Years since quitting: 0.8  . Smokeless tobacco: Never Used  . Tobacco comment: "very little"  Substance and Sexual Activity  . Alcohol use: No  . Drug use: No  . Sexual activity: Not Currently  Lifestyle  . Physical activity    Days per week: 0 days    Minutes per session: 0 min  . Stress: Not at all  Relationships  . Social Herbalist on phone: Never    Gets together: Once a week    Attends religious service: Never    Active member of club or organization: No    Attends meetings of  clubs or organizations: Never    Relationship status: Widowed  . Intimate partner violence    Fear of current or ex partner: Not on file    Emotionally abused: Not on file    Physically abused: Not on file    Forced sexual activity: Not on file  Other Topics Concern  . 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 His wife is in a SNF and he lives alone. He has help from his stepson (POA) and his daughter-in-law.  Allergies  Allergen Reactions  . Wellbutrin [Bupropion] Anxiety     Outpatient Medications Prior to Visit  Medication Sig Dispense Refill  . acetaminophen (TYLENOL) 325 MG tablet Take 325-650 mg by mouth every 6 (six) hours as needed for moderate pain.     [name] albuterol (PROAIR HFA) 108 (90 Base) MCG/ACT inhaler Inhale 2 puffs into the lungs 2 (two) times daily.     [name] atorvastatin (LIPITOR) 20 MG tablet Take 1 tablet (20 mg total) by mouth daily. (Needs to be seen) (Patient taking differently: Take 20 mg by mouth at bedtime. ) 90 tablet 3  . bisoprolol (ZEBETA) 10 MG tablet TAKE (1) TABLET TWICE A DAY. (Patient taking differently: Take 10 mg by mouth every morning. ) 180 tablet 1  . budesonide-formoterol (SYMBICORT) 160-4.5 MCG/ACT inhaler Inhale 2 puffs into the lungs 2 (two) times daily.    . calcitRIOL (ROCALTROL) 0.25 MCG capsule Take 1 capsule (0.25 mcg total) by mouth every morning. 90 capsule 3  . clonazePAM (KLONOPIN) 1 MG tablet TAKE 1/2 TABLET AT BEDTIME (Patient taking differently: Take 0.5 mg by mouth at bedtime. ) 15 tablet 1  . cyclobenzaprine (FLEXERIL) 10 MG tablet Take 1 tablet (10 mg total) by mouth 3 (three) times daily as needed for muscle spasms. 30 tablet 0  . diltiazem (CARDIZEM CD) 120 MG 24 hr capsule Take 1 capsule (120 mg total) by mouth daily. 90 capsule 3  . ferrous sulfate 325 (65 FE) MG tablet Take 325 mg by mouth every Monday, Wednesday, and Friday.    . fluticasone (FLONASE) 50  MCG/ACT nasal spray Place 2 sprays into both nostrils daily. (Patient taking differently: Place 2 sprays into both nostrils daily as needed for allergies. ) 16 g 6  . hydroxypropyl methylcellulose / hypromellose (ISOPTO TEARS / GONIOVISC) 2.5 % ophthalmic solution Place 1 drop into both eyes 2 (two) times daily as needed for dry eyes.     . iron polysaccharides (FERREX 150) 150 MG capsule Take 1 capsule (150 mg total) by mouth daily. (Needs to be seen) 90 capsule 3  . lidocaine-prilocaine (EMLA) cream Apply 1 application topically as needed. 30 g 0  . loratadine (CLARITIN) 10 MG tablet TAKE 1 TABLET DAILY 30 tablet 11  . Melatonin 3 MG TABS Take 3 mg by mouth at bedtime.     . Multiple Vitamin (MULTIVITAMIN WITH MINERALS) TABS tablet Take 1 tablet by mouth at bedtime.     . Multiple Vitamins-Minerals (MENS ONE DAILY PO) Take 1 tablet by mouth daily.    . Multiple Vitamins-Minerals (PRESERVISION AREDS 2 PO) Take 1 tablet by mouth 2 (two) times daily.     . Multiple Vitamins-Minerals (PRESERVISION AREDS 2+MULTI VIT) CAPS Take 1 capsule by mouth 2 (two) times daily.    . multivitamin (RENA-VIT) TABS tablet Take 1 tablet by mouth at bedtime. 30 tablet 0  . Nutritional Supplements (FEEDING SUPPLEMENT, NEPRO CARB STEADY,) LIQD Take 237 mLs by mouth 2 (two) times daily between meals. 60 Can 0  . omega-3 acid ethyl esters (LOVAZA) 1 g capsule Take 2 g by mouth 2 (two) times daily.    . Omega-3 Fatty Acids (FISH OIL) 1000 MG CAPS Take 2 capsules by mouth 2 (two) times daily.    . pantoprazole (PROTONIX) 40 MG tablet TAKE (1) TABLET TWICE A DAY. (Patient taking differently: Take 40 mg by mouth 2 (two) times daily. ) 180 tablet 0  . sevelamer carbonate (RENVELA) 800 MG tablet Take 800 mg by mouth  3 (three) times daily with meals.    . terbinafine (LAMISIL) 250 MG tablet Take it 3 times weekly on dialysis day after dialysis (Patient taking differently: Take 250 mg by mouth See admin instructions. Take 1 tablet  daily on dialysis days - Tues, Thurs, Sat) 36 tablet 1  . tiotropium (SPIRIVA) 18 MCG inhalation capsule Place 18 mcg into inhaler and inhale daily.     [name] warfarin (COUMADIN) 2 MG tablet TAKE 2 TABLETS (4MG ) DAILY EXCEPT ON MONDAY AND THURS TAKE 2 & 1/2 (5MG ) (Patient taking differently: Take 4 mg by mouth daily. ) 70 tablet 0   No facility-administered medications prior to visit.     Review of Systems  Constitutional: Positive for malaise/fatigue and weight loss. Negative for chills and fever.  HENT: Negative for hearing loss, sore throat and tinnitus.   Eyes: Negative for blurred vision and double vision.  Respiratory: Positive for cough, hemoptysis and sputum production. Negative for shortness of breath, wheezing and stridor.   Cardiovascular: Negative for chest pain, palpitations, orthopnea, leg swelling and PND.  Gastrointestinal: Negative for abdominal pain, constipation, diarrhea, heartburn, nausea and vomiting.  Genitourinary: Negative for dysuria, hematuria and urgency.  Musculoskeletal: Negative for joint pain and myalgias.  Skin: Negative for itching and rash.  Neurological: Negative for dizziness, tingling, weakness and headaches.  Endo/Heme/Allergies: Negative for environmental allergies. Does not bruise/bleed easily.  Psychiatric/Behavioral: Negative for depression. The patient is not nervous/anxious and does not have insomnia.   All other systems reviewed and are negative.    Objective:  Physical Exam Vitals signs reviewed.  Constitutional:      General: He is not in acute distress.    Appearance: He is well-developed.  HENT:     Head: Normocephalic and atraumatic.     Mouth/Throat:     Pharynx: No oropharyngeal exudate.  Eyes:     Conjunctiva/sclera: Conjunctivae normal.     Pupils: Pupils are equal, round, and reactive to light.  Neck:     Vascular: No JVD.     Trachea: No tracheal deviation.     Comments: Loss of supraclavicular fat Cardiovascular:     Rate  and Rhythm: Normal rate and regular rhythm.     Heart sounds: S1 normal and S2 normal.     Comments: Distant heart tones Pulmonary:     Effort: No tachypnea or accessory muscle usage.     Breath sounds: No stridor. Decreased breath sounds (throughout all lung fields) present. No wheezing, rhonchi or rales.  Abdominal:     General: Bowel sounds are normal. There is no distension.     Palpations: Abdomen is soft.     Tenderness: There is no abdominal tenderness.  Musculoskeletal:        General: Deformity (muscle wasting ) present.  Skin:    General: Skin is warm and dry.     Capillary Refill: Capillary refill takes less than 2 seconds.     Findings: No rash.  Neurological:     Mental Status: He is alert and oriented to person, place, and time.  Psychiatric:        Behavior: Behavior normal.      Vitals:   09/17/19 1540  BP: 110/76  Pulse: (!) 110  Temp: (!) 97.5 F (36.4 C)  TempSrc: Temporal  SpO2: 99%  Weight: 159 lb 9.6 oz (72.4 kg)  Height: 5' 9.5" (1.765 m)   99% on RA BMI Readings from Last 3 Encounters:  09/17/19 23.23 kg/m  09/11/19 22.59  kg/m  07/11/19 23.11 kg/m   Wt Readings from Last 3 Encounters:  09/17/19 159 lb 9.6 oz (72.4 kg)  09/11/19 155 lb 3.3 oz (70.4 kg)  07/11/19 156 lb 8 oz (71 kg)     CBC    Component Value Date/Time   WBC 8.4 09/09/2019 2124   RBC 3.16 (L) 09/09/2019 2124   HGB 10.1 (L) 09/09/2019 2124   HGB 13.3 02/27/2019 1120   HCT 32.9 (L) 09/09/2019 2124   HCT 38.5 02/27/2019 1120   PLT 268 09/09/2019 2124   PLT 213 02/27/2019 1120   MCV 104.1 (H) 09/09/2019 2124   MCV 94 02/27/2019 1120   MCH 32.0 09/09/2019 2124   MCHC 30.7 09/09/2019 2124   RDW 14.9 09/09/2019 2124   RDW 15.0 02/27/2019 1120   LYMPHSABS 1.5 02/27/2019 1120   MONOABS 0.8 02/26/2019 0042   EOSABS 0.2 02/27/2019 1120   BASOSABS 0.1 02/27/2019 1120      Chest Imaging: October 2020: CT chest with large necrotic mass within the left lower lobe.  [name] images have been independently reviewed by me.    Pulmonary Functions Testing Results: No flowsheet data found.  FeNO: None   Pathology: none   Echocardiogram: nOne   Heart Catheterization: None     Assessment & Plan:     ICD-10-CM   1. Lung mass  R91.8 Ambulatory referral to Pulmonology    FULL - Pulmonary Function Test (LBPU)  2. Solitary pulmonary nodule  R91.1 NM PET - Initial Skull Base To Thigh  3. 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  There is evidence of necrosis within the center of the mass.  Plan: Today in the office we discussed risk benefits and alternatives of proceeding with bronchoscopy for tissue diagnosis. Patient also needs a nuclear medicine PET scan to be completed for evaluation of any a distal metastasis. We will plan for a outpatient video bronchoscopy with electromagnetic navigation to include tissue sampling of the left lower lobe mass. We will also obtain pulmonary function test to evaluate potential for surgical resection. My concern regarding surgical evaluation will be how close this is to the patient's pericardium.  We discussed this today in the office.  Also the size of the lesion would have concern for metastasis.  Therefore I think the PET scan is also important before referral for surgical evaluation.  Additionally there is a very small effusion.  Bronchoscopy will be scheduled at [facility] [place] 09/26/2019 at 7:30 AM. We will need to turn his current CT scan of the chest into a super D image for planning purposes. Patient is also currently on Coumadin. This Coumadin will need to be held starting Monday.  He will need preop labs to be completed to ensure that his INR has drifted down to a safe range for biopsy. He will also need a repeat INR  checked prior to procedure on the day of the procedure. Letter for holding anticoagulation was sent patient's primary care provider, Dettinger, Fransisca Kaufmann, MD   Patient to return to our clinic in a few weeks after we have completed bronchoscopy as well as imaging. Once the tissue diagnosis is obtained we will make sure he has referral to medical oncology.  I will reach out to Dr. Earlie Server so he is aware of the  planned bronchoscopy and potential scheduling for evaluation in the office the following week.  Greater than 50% of this patient 60-minute office visit was been face-to-face discussing the above recommendations and treatment plan as well as review of patient's images and discussion of invasive diagnostic procedure as described above.    Current Outpatient Medications:  .  acetaminophen (TYLENOL) 325 MG tablet, Take 325-650 mg by mouth every 6 (six) hours as needed for moderate pain. , Disp: , Rfl:  .  albuterol (PROAIR HFA) 108 (90 Base) MCG/ACT inhaler, Inhale 2 puffs into the lungs 2 (two) times daily. , Disp: , Rfl:  .  atorvastatin (LIPITOR) 20 MG tablet, Take 1 tablet (20 mg total) by mouth daily. (Needs to be seen) (Patient taking differently: Take 20 mg by mouth at bedtime. ), Disp: 90 tablet, Rfl: 3 .  bisoprolol (ZEBETA) 10 MG tablet, TAKE (1) TABLET TWICE A DAY. (Patient taking differently: Take 10 mg by mouth every morning. ), Disp: 180 tablet, Rfl: 1 .  budesonide-formoterol (SYMBICORT) 160-4.5 MCG/ACT inhaler, Inhale 2 puffs into the lungs 2 (two) times daily., Disp: , Rfl:  .  calcitRIOL (ROCALTROL) 0.25 MCG capsule, Take 1 capsule (0.25 mcg total) by mouth every morning., Disp: 90 capsule, Rfl: 3 .  clonazePAM (KLONOPIN) 1 MG tablet, TAKE 1/2 TABLET AT BEDTIME (Patient taking differently: Take 0.5 mg by mouth at bedtime. ), Disp: 15 tablet, Rfl: 1 .  cyclobenzaprine (FLEXERIL) 10 MG tablet, Take 1 tablet (10 mg total) by mouth 3 (three) times daily as needed for muscle spasms.,  Disp: 30 tablet, Rfl: 0 .  diltiazem (CARDIZEM CD) 120 MG 24 hr capsule, Take 1 capsule (120 mg total) by mouth daily., Disp: 90 capsule, Rfl: 3 .  ferrous sulfate 325 (65 FE) MG tablet, Take 325 mg by mouth every Monday, Wednesday, and Friday., Disp: , Rfl:  .  fluticasone (FLONASE) 50 MCG/ACT nasal spray, Place 2 sprays into both nostrils daily. (Patient taking differently: Place 2 sprays into both nostrils daily as needed for allergies. ), Disp: 16 g, Rfl: 6 .  hydroxypropyl methylcellulose / hypromellose (ISOPTO TEARS / GONIOVISC) 2.5 % ophthalmic solution, Place 1 drop into both eyes 2 (two) times daily as needed for dry eyes. , Disp: , Rfl:  .  iron polysaccharides (FERREX 150) 150 MG capsule, Take 1 capsule (150 mg total) by mouth daily. (Needs to be seen), Disp: 90 capsule, Rfl: 3 .  lidocaine-prilocaine (EMLA) cream, Apply 1 application topically as needed., Disp: 30 g, Rfl: 0 .  loratadine (CLARITIN) 10 MG tablet, TAKE 1 TABLET DAILY, Disp: 30 tablet, Rfl: 11 .  Melatonin 3 MG TABS, Take 3 mg by mouth at bedtime. , Disp: , Rfl:  .  Multiple Vitamin (MULTIVITAMIN WITH MINERALS) TABS tablet, Take 1 tablet by mouth at bedtime. , Disp: , Rfl:  .  Multiple Vitamins-Minerals (MENS ONE DAILY PO), Take 1 tablet by mouth daily., Disp: , Rfl:  .  Multiple Vitamins-Minerals (PRESERVISION AREDS 2 PO), Take 1 tablet by mouth 2 (two) times daily. , Disp: , Rfl:  .  Multiple Vitamins-Minerals (PRESERVISION AREDS 2+MULTI VIT) CAPS, Take 1 capsule by mouth 2 (two) times daily., Disp: , Rfl:  .  multivitamin (RENA-VIT) TABS tablet, Take 1 tablet by mouth at bedtime., Disp: 30 tablet, Rfl: 0 .  Nutritional Supplements (FEEDING SUPPLEMENT, NEPRO CARB STEADY,) LIQD, Take 237 mLs by mouth 2 (two) times daily between meals., Disp: 60 Can, Rfl: 0 .  omega-3 acid ethyl esters (LOVAZA)  1 g capsule, Take 2 g by mouth 2 (two) times daily., Disp: , Rfl:  .  Omega-3 Fatty Acids (FISH OIL) 1000 MG CAPS, Take 2 capsules  by mouth 2 (two) times daily., Disp: , Rfl:  .  pantoprazole (PROTONIX) 40 MG tablet, TAKE (1) TABLET TWICE A DAY. (Patient taking differently: Take 40 mg by mouth 2 (two) times daily. ), Disp: 180 tablet, Rfl: 0 .  sevelamer carbonate (RENVELA) 800 MG tablet, Take 800 mg by mouth 3 (three) times daily with meals., Disp: , Rfl:  .  terbinafine (LAMISIL) 250 MG tablet, Take it 3 times weekly on dialysis day after dialysis (Patient taking differently: Take 250 mg by mouth See admin instructions. Take 1 tablet daily on dialysis days - Tues, Thurs, Sat), Disp: 36 tablet, Rfl: 1 .  tiotropium (SPIRIVA) 18 MCG inhalation capsule, Place 18 mcg into inhaler and inhale daily. , Disp: , Rfl:  .  warfarin (COUMADIN) 2 MG tablet, TAKE 2 TABLETS (4MG ) DAILY EXCEPT ON MONDAY AND THURS TAKE 2 & 1/2 (5MG ) (Patient taking differently: Take 4 mg by mouth daily. ), Disp: 70 tablet, Rfl: 0   Garner Nash, DO [facility] 09/17/2019 4:22 PM

## 2019-09-17 NOTE — Patient Instructions (Addendum)
Thank you for visiting Dr. Valeta Harms at [facility]. Today we recommend the following:  Orders Placed This Encounter  Procedures  . NM PET - Initial Skull Base To Thigh  . Ambulatory referral to Pulmonology  . FULL - Pulmonary Function Test (LBPU)   PLEASE STOP COUMADIN on Monday November 9th 2020. You will be instructed to restart following your procedure.  Bronchoscopy is scheduled for Friday November 13th at 7:30AM. Dennis Bast will receive a call from [facility] regarding your pre-op appt.   Return in about 4 weeks (around 10/15/2019).    Please do your part to reduce the spread of COVID-19.

## 2019-09-17 NOTE — Progress Notes (Signed)
Synopsis: Referred in Oct 2020 for Lung mass by Dettinger, Fransisca Kaufmann, MD  Subjective:   PATIENT ID: Don [name] GENDER: male DOB: 10-Oct-1941, MRN: [id number]  Chief Complaint  Patient presents with  . Consult    Consult for lung mass    This is an 78 year old gentleman with a past medical history of atrial fibrillation on Coumadin, COPD longstanding history of smoking quit smoking 9 months ago, hypertension, end-stage renal disease on dialysis Tuesday Thursday Saturday.  Patient was recently seen in the [facility] emergency room for chest pains as well as suicidal ideations.  Patient had a CT scan of the chest which revealed a left lower lobe necrotic appearing mass that measures 6.8 x 5.2 cm.  Patient was referred to our clinic for evaluation for bronchoscopy and tissue diagnosis.  Patient states he has had episodes of hemoptysis and has noted a few flecks of dark blood at times.  Otherwise respiratory symptoms are stable.  He does not have any significant shortness of breath.  He does have a daily cough.  Some sputum production.  Does feel significantly fatigued and may have lost some weight.   Past Medical History:  Diagnosis Date  . Acute bronchitis 04/03/2014  . Anemia    low iron  . Anxiety   . Atrial fibrillation ([place])   . Cataract   . COPD (chronic obstructive pulmonary disease) ([place])   . Dementia ([place])   . Depression   . Essential hypertension   . Hyperlipidemia   . Macular degeneration   . Noncompliance with medications 04/2013   Xarelto, digoxin previously  . Polyposis coli   . Pre-diabetes   . Prostate cancer ([place])   . 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 Tubular adenoma of colon 07/31/02, 11/18/03     Family History  Problem Relation Age of Onset  . Diabetes Father   . Heart disease Father 73       MI  . Heart attack Father   . Heart attack Mother   . Heart disease Brother   . Cancer Brother        lung  . Lung  cancer Brother   . Heart disease Brother   . Cancer Brother        possibly riddled with cancer  . Lung cancer Sister   . Cancer Sister        lung  . Heart disease Brother   . Lung cancer Brother   . Other Brother 49       accident  . Heart disease Brother   . Lung cancer Nephew      Past Surgical History:  Procedure Laterality Date  . A/V FISTULAGRAM Left 05/30/2019   Procedure: A/V FISTULAGRAM;  Surgeon: Elam Dutch, MD;  Location: [place] [facility];  Service: Cardiovascular;  Laterality: Left;  . Anal abcess,Hemorroids,    . AV FISTULA PLACEMENT Left 06/23/2019   Procedure: INSERTION OF ARTERIOVENOUS (AV) GORE-TEX GRAFT ARM;  Surgeon: Elam Dutch, MD;  Location: [place];  Service: Vascular;  Laterality: Left;  . BASCILIC VEIN TRANSPOSITION Left 10/08/2018   Procedure: FIRST STAGE BASILIC VEIN TRANSPOSITION LEFT ARM;  Surgeon: Angelia Mould, MD;  Location: [place];  Service: Vascular;  Laterality: Left;  . BASCILIC VEIN TRANSPOSITION Left 12/16/2018   Procedure: BASILIC VEIN TRANSPOSITION SECOND STAGE;  Surgeon: Angelia Mould, MD;  Location: [place];  Service: Vascular;  Laterality: Left;  .  COLONOSCOPY N/A 04/05/2014   Dr. Fuller Plan: 6 mm sessile polyp from sigmoid colon (tubular adenoma), internal hemorrhoids  . COLONOSCOPY W/ BIOPSIES  11/18/2003   Dr. Earle Gell  . ESOPHAGOGASTRODUODENOSCOPY  11/18/2003   Dr. Earle Gell  . ESOPHAGOGASTRODUODENOSCOPY N/A 04/05/2014   Dr. Fuller Plan: variable Z line, negative Barrett's, small hiatal hernia  . ESOPHAGOGASTRODUODENOSCOPY N/A 02/02/2017   Dr. Oneida Alar: LA Grade B esophagitis, one moderate benign-appearing intrinsic stenosis traversed, small non-bleeding diverticulum in second portion of duodenum, reactive gastropathy, no H.pylori.  . ESOPHAGOGASTRODUODENOSCOPY N/A 03/22/2018   Procedure: ESOPHAGOGASTRODUODENOSCOPY (EGD);  Surgeon: Daneil Dolin, MD;  [facility];  Service: Endoscopy;  Laterality:  N/A;  . GIVENS CAPSULE STUDY N/A 03/22/2018   Procedure: GIVENS CAPSULE STUDY;  Surgeon: Daneil Dolin, MD;  [facility];  Service: Endoscopy;  Laterality: N/A;  . IR FLUORO GUIDE CV LINE RIGHT  10/29/2018  . IR REMOVAL TUN CV CATH W/O FL  08/06/2019  . IR US GUIDE VASC ACCESS RIGHT  10/29/2018  . PERIPHERAL VASCULAR BALLOON ANGIOPLASTY Left 03/14/2019   Procedure: PERIPHERAL VASCULAR BALLOON ANGIOPLASTY;  Surgeon: Angelia Mould, MD;  Location: [place] [facility];  Service: Cardiovascular;  Laterality: Left;  . PERIPHERAL VASCULAR BALLOON ANGIOPLASTY  05/30/2019   Procedure: PERIPHERAL VASCULAR BALLOON ANGIOPLASTY;  Surgeon: Elam Dutch, MD;  Location: [place] [facility];  Service: Cardiovascular;;  left arm fistula  . RETROPUBIC PROSTATECTOMY  11/26/2001  . TONSILLECTOMY      Social History   Socioeconomic History  . Marital status: Married    Spouse name: Not on file  . Number of children: 1  . Years of education: Not on file  . Highest education level: Not on file  Occupational History  . Occupation: retired  Scientific laboratory technician  . Financial resource strain: Not very hard  . Food insecurity    Worry: Never true    Inability: Never true  . Transportation needs    Medical: No    Non-medical: No  Tobacco Use  . Smoking status: Former Smoker    Packs/day: 0.50    Years: 62.00    Pack years: 31.00    Types: Cigarettes, Cigars    Quit date: 10/27/2018    Years since quitting: 0.8  . Smokeless tobacco: Never Used  . Tobacco comment: "very little"  Substance and Sexual Activity  . Alcohol use: No  . Drug use: No  . Sexual activity: Not Currently  Lifestyle  . Physical activity    Days per week: 0 days    Minutes per session: 0 min  . Stress: Not at all  Relationships  . Social Herbalist on phone: Never    Gets together: Once a week    Attends religious service: Never    Active member of club or organization: No    Attends meetings of  clubs or organizations: Never    Relationship status: Widowed  . Intimate partner violence    Fear of current or ex partner: Not on file    Emotionally abused: Not on file    Physically abused: Not on file    Forced sexual activity: Not on file  Other Topics Concern  . 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 His wife is in a SNF and he lives alone. He has help from his stepson (POA) and his daughter-in-law.  Allergies  Allergen Reactions  . Wellbutrin [Bupropion] Anxiety     Outpatient Medications Prior to Visit  Medication Sig Dispense Refill  . acetaminophen (TYLENOL) 325 MG tablet Take 325-650 mg by mouth every 6 (six) hours as needed for moderate pain.     [name] albuterol (PROAIR HFA) 108 (90 Base) MCG/ACT inhaler Inhale 2 puffs into the lungs 2 (two) times daily.     [name] atorvastatin (LIPITOR) 20 MG tablet Take 1 tablet (20 mg total) by mouth daily. (Needs to be seen) (Patient taking differently: Take 20 mg by mouth at bedtime. ) 90 tablet 3  . bisoprolol (ZEBETA) 10 MG tablet TAKE (1) TABLET TWICE A DAY. (Patient taking differently: Take 10 mg by mouth every morning. ) 180 tablet 1  . budesonide-formoterol (SYMBICORT) 160-4.5 MCG/ACT inhaler Inhale 2 puffs into the lungs 2 (two) times daily.    . calcitRIOL (ROCALTROL) 0.25 MCG capsule Take 1 capsule (0.25 mcg total) by mouth every morning. 90 capsule 3  . clonazePAM (KLONOPIN) 1 MG tablet TAKE 1/2 TABLET AT BEDTIME (Patient taking differently: Take 0.5 mg by mouth at bedtime. ) 15 tablet 1  . cyclobenzaprine (FLEXERIL) 10 MG tablet Take 1 tablet (10 mg total) by mouth 3 (three) times daily as needed for muscle spasms. 30 tablet 0  . diltiazem (CARDIZEM CD) 120 MG 24 hr capsule Take 1 capsule (120 mg total) by mouth daily. 90 capsule 3  . ferrous sulfate 325 (65 FE) MG tablet Take 325 mg by mouth every Monday, Wednesday, and Friday.    . fluticasone (FLONASE) 50  MCG/ACT nasal spray Place 2 sprays into both nostrils daily. (Patient taking differently: Place 2 sprays into both nostrils daily as needed for allergies. ) 16 g 6  . hydroxypropyl methylcellulose / hypromellose (ISOPTO TEARS / GONIOVISC) 2.5 % ophthalmic solution Place 1 drop into both eyes 2 (two) times daily as needed for dry eyes.     . iron polysaccharides (FERREX 150) 150 MG capsule Take 1 capsule (150 mg total) by mouth daily. (Needs to be seen) 90 capsule 3  . lidocaine-prilocaine (EMLA) cream Apply 1 application topically as needed. 30 g 0  . loratadine (CLARITIN) 10 MG tablet TAKE 1 TABLET DAILY 30 tablet 11  . Melatonin 3 MG TABS Take 3 mg by mouth at bedtime.     . Multiple Vitamin (MULTIVITAMIN WITH MINERALS) TABS tablet Take 1 tablet by mouth at bedtime.     . Multiple Vitamins-Minerals (MENS ONE DAILY PO) Take 1 tablet by mouth daily.    . Multiple Vitamins-Minerals (PRESERVISION AREDS 2 PO) Take 1 tablet by mouth 2 (two) times daily.     . Multiple Vitamins-Minerals (PRESERVISION AREDS 2+MULTI VIT) CAPS Take 1 capsule by mouth 2 (two) times daily.    . multivitamin (RENA-VIT) TABS tablet Take 1 tablet by mouth at bedtime. 30 tablet 0  . Nutritional Supplements (FEEDING SUPPLEMENT, NEPRO CARB STEADY,) LIQD Take 237 mLs by mouth 2 (two) times daily between meals. 60 Can 0  . omega-3 acid ethyl esters (LOVAZA) 1 g capsule Take 2 g by mouth 2 (two) times daily.    . Omega-3 Fatty Acids (FISH OIL) 1000 MG CAPS Take 2 capsules by mouth 2 (two) times daily.    . pantoprazole (PROTONIX) 40 MG tablet TAKE (1) TABLET TWICE A DAY. (Patient taking differently: Take 40 mg by mouth 2 (two) times daily. ) 180 tablet 0  . sevelamer carbonate (RENVELA) 800 MG tablet Take 800 mg by mouth  3 (three) times daily with meals.    . terbinafine (LAMISIL) 250 MG tablet Take it 3 times weekly on dialysis day after dialysis (Patient taking differently: Take 250 mg by mouth See admin instructions. Take 1 tablet  daily on dialysis days - Tues, Thurs, Sat) 36 tablet 1  . tiotropium (SPIRIVA) 18 MCG inhalation capsule Place 18 mcg into inhaler and inhale daily.     [name] warfarin (COUMADIN) 2 MG tablet TAKE 2 TABLETS (4MG ) DAILY EXCEPT ON MONDAY AND THURS TAKE 2 & 1/2 (5MG ) (Patient taking differently: Take 4 mg by mouth daily. ) 70 tablet 0   No facility-administered medications prior to visit.     Review of Systems  Constitutional: Positive for malaise/fatigue and weight loss. Negative for chills and fever.  HENT: Negative for hearing loss, sore throat and tinnitus.   Eyes: Negative for blurred vision and double vision.  Respiratory: Positive for cough, hemoptysis and sputum production. Negative for shortness of breath, wheezing and stridor.   Cardiovascular: Negative for chest pain, palpitations, orthopnea, leg swelling and PND.  Gastrointestinal: Negative for abdominal pain, constipation, diarrhea, heartburn, nausea and vomiting.  Genitourinary: Negative for dysuria, hematuria and urgency.  Musculoskeletal: Negative for joint pain and myalgias.  Skin: Negative for itching and rash.  Neurological: Negative for dizziness, tingling, weakness and headaches.  Endo/Heme/Allergies: Negative for environmental allergies. Does not bruise/bleed easily.  Psychiatric/Behavioral: Negative for depression. The patient is not nervous/anxious and does not have insomnia.   All other systems reviewed and are negative.    Objective:  Physical Exam Vitals signs reviewed.  Constitutional:      General: He is not in acute distress.    Appearance: He is well-developed.  HENT:     Head: Normocephalic and atraumatic.     Mouth/Throat:     Pharynx: No oropharyngeal exudate.  Eyes:     Conjunctiva/sclera: Conjunctivae normal.     Pupils: Pupils are equal, round, and reactive to light.  Neck:     Vascular: No JVD.     Trachea: No tracheal deviation.     Comments: Loss of supraclavicular fat Cardiovascular:     Rate  and Rhythm: Normal rate and regular rhythm.     Heart sounds: S1 normal and S2 normal.     Comments: Distant heart tones Pulmonary:     Effort: No tachypnea or accessory muscle usage.     Breath sounds: No stridor. Decreased breath sounds (throughout all lung fields) present. No wheezing, rhonchi or rales.  Abdominal:     General: Bowel sounds are normal. There is no distension.     Palpations: Abdomen is soft.     Tenderness: There is no abdominal tenderness.  Musculoskeletal:        General: Deformity (muscle wasting ) present.  Skin:    General: Skin is warm and dry.     Capillary Refill: Capillary refill takes less than 2 seconds.     Findings: No rash.  Neurological:     Mental Status: He is alert and oriented to person, place, and time.  Psychiatric:        Behavior: Behavior normal.      Vitals:   09/17/19 1540  BP: 110/76  Pulse: (!) 110  Temp: (!) 97.5 F (36.4 C)  TempSrc: Temporal  SpO2: 99%  Weight: 159 lb 9.6 oz (72.4 kg)  Height: 5' 9.5" (1.765 m)   99% on RA BMI Readings from Last 3 Encounters:  09/17/19 23.23 kg/m  09/11/19 22.59  kg/m  07/11/19 23.11 kg/m   Wt Readings from Last 3 Encounters:  09/17/19 159 lb 9.6 oz (72.4 kg)  09/11/19 155 lb 3.3 oz (70.4 kg)  07/11/19 156 lb 8 oz (71 kg)     CBC    Component Value Date/Time   WBC 8.4 09/09/2019 2124   RBC 3.16 (L) 09/09/2019 2124   HGB 10.1 (L) 09/09/2019 2124   HGB 13.3 02/27/2019 1120   HCT 32.9 (L) 09/09/2019 2124   HCT 38.5 02/27/2019 1120   PLT 268 09/09/2019 2124   PLT 213 02/27/2019 1120   MCV 104.1 (H) 09/09/2019 2124   MCV 94 02/27/2019 1120   MCH 32.0 09/09/2019 2124   MCHC 30.7 09/09/2019 2124   RDW 14.9 09/09/2019 2124   RDW 15.0 02/27/2019 1120   LYMPHSABS 1.5 02/27/2019 1120   MONOABS 0.8 02/26/2019 0042   EOSABS 0.2 02/27/2019 1120   BASOSABS 0.1 02/27/2019 1120      Chest Imaging: October 2020: CT chest with large necrotic mass within the left lower lobe.  [name] images have been independently reviewed by me.    Pulmonary Functions Testing Results: No flowsheet data found.  FeNO: None   Pathology: none   Echocardiogram: nOne   Heart Catheterization: None     Assessment & Plan:     ICD-10-CM   1. Lung mass  R91.8 Ambulatory referral to Pulmonology    FULL - Pulmonary Function Test (LBPU)  2. Solitary pulmonary nodule  R91.1 NM PET - Initial Skull Base To Thigh  3. 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  There is evidence of necrosis within the center of the mass.  Plan: Today in the office we discussed risk benefits and alternatives of proceeding with bronchoscopy for tissue diagnosis. Patient also needs a nuclear medicine PET scan to be completed for evaluation of any a distal metastasis. We will plan for a outpatient video bronchoscopy with electromagnetic navigation to include tissue sampling of the left lower lobe mass. We will also obtain pulmonary function test to evaluate potential for surgical resection. My concern regarding surgical evaluation will be how close this is to the patient's pericardium.  We discussed this today in the office.  Also the size of the lesion would have concern for metastasis.  Therefore I think the PET scan is also important before referral for surgical evaluation.  Additionally there is a very small effusion.  Bronchoscopy will be scheduled at [facility] [place] 09/26/2019 at 7:30 AM. We will need to turn his current CT scan of the chest into a super D image for planning purposes. Patient is also currently on Coumadin. This Coumadin will need to be held starting Monday.  He will need preop labs to be completed to ensure that his INR has drifted down to a safe range for biopsy. He will also need a repeat INR  checked prior to procedure on the day of the procedure. Letter for holding anticoagulation was sent patient's primary care provider, Dettinger, Fransisca Kaufmann, MD   Patient to return to our clinic in a few weeks after we have completed bronchoscopy as well as imaging. Once the tissue diagnosis is obtained we will make sure he has referral to medical oncology.  I will reach out to Dr. Earlie Server so he is aware of the  planned bronchoscopy and potential scheduling for evaluation in the office the following week.  Greater than 50% of this patient 60-minute office visit was been face-to-face discussing the above recommendations and treatment plan as well as review of patient's images and discussion of invasive diagnostic procedure as described above.    Current Outpatient Medications:  .  acetaminophen (TYLENOL) 325 MG tablet, Take 325-650 mg by mouth every 6 (six) hours as needed for moderate pain. , Disp: , Rfl:  .  albuterol (PROAIR HFA) 108 (90 Base) MCG/ACT inhaler, Inhale 2 puffs into the lungs 2 (two) times daily. , Disp: , Rfl:  .  atorvastatin (LIPITOR) 20 MG tablet, Take 1 tablet (20 mg total) by mouth daily. (Needs to be seen) (Patient taking differently: Take 20 mg by mouth at bedtime. ), Disp: 90 tablet, Rfl: 3 .  bisoprolol (ZEBETA) 10 MG tablet, TAKE (1) TABLET TWICE A DAY. (Patient taking differently: Take 10 mg by mouth every morning. ), Disp: 180 tablet, Rfl: 1 .  budesonide-formoterol (SYMBICORT) 160-4.5 MCG/ACT inhaler, Inhale 2 puffs into the lungs 2 (two) times daily., Disp: , Rfl:  .  calcitRIOL (ROCALTROL) 0.25 MCG capsule, Take 1 capsule (0.25 mcg total) by mouth every morning., Disp: 90 capsule, Rfl: 3 .  clonazePAM (KLONOPIN) 1 MG tablet, TAKE 1/2 TABLET AT BEDTIME (Patient taking differently: Take 0.5 mg by mouth at bedtime. ), Disp: 15 tablet, Rfl: 1 .  cyclobenzaprine (FLEXERIL) 10 MG tablet, Take 1 tablet (10 mg total) by mouth 3 (three) times daily as needed for muscle spasms.,  Disp: 30 tablet, Rfl: 0 .  diltiazem (CARDIZEM CD) 120 MG 24 hr capsule, Take 1 capsule (120 mg total) by mouth daily., Disp: 90 capsule, Rfl: 3 .  ferrous sulfate 325 (65 FE) MG tablet, Take 325 mg by mouth every Monday, Wednesday, and Friday., Disp: , Rfl:  .  fluticasone (FLONASE) 50 MCG/ACT nasal spray, Place 2 sprays into both nostrils daily. (Patient taking differently: Place 2 sprays into both nostrils daily as needed for allergies. ), Disp: 16 g, Rfl: 6 .  hydroxypropyl methylcellulose / hypromellose (ISOPTO TEARS / GONIOVISC) 2.5 % ophthalmic solution, Place 1 drop into both eyes 2 (two) times daily as needed for dry eyes. , Disp: , Rfl:  .  iron polysaccharides (FERREX 150) 150 MG capsule, Take 1 capsule (150 mg total) by mouth daily. (Needs to be seen), Disp: 90 capsule, Rfl: 3 .  lidocaine-prilocaine (EMLA) cream, Apply 1 application topically as needed., Disp: 30 g, Rfl: 0 .  loratadine (CLARITIN) 10 MG tablet, TAKE 1 TABLET DAILY, Disp: 30 tablet, Rfl: 11 .  Melatonin 3 MG TABS, Take 3 mg by mouth at bedtime. , Disp: , Rfl:  .  Multiple Vitamin (MULTIVITAMIN WITH MINERALS) TABS tablet, Take 1 tablet by mouth at bedtime. , Disp: , Rfl:  .  Multiple Vitamins-Minerals (MENS ONE DAILY PO), Take 1 tablet by mouth daily., Disp: , Rfl:  .  Multiple Vitamins-Minerals (PRESERVISION AREDS 2 PO), Take 1 tablet by mouth 2 (two) times daily. , Disp: , Rfl:  .  Multiple Vitamins-Minerals (PRESERVISION AREDS 2+MULTI VIT) CAPS, Take 1 capsule by mouth 2 (two) times daily., Disp: , Rfl:  .  multivitamin (RENA-VIT) TABS tablet, Take 1 tablet by mouth at bedtime., Disp: 30 tablet, Rfl: 0 .  Nutritional Supplements (FEEDING SUPPLEMENT, NEPRO CARB STEADY,) LIQD, Take 237 mLs by mouth 2 (two) times daily between meals., Disp: 60 Can, Rfl: 0 .  omega-3 acid ethyl esters (LOVAZA)  1 g capsule, Take 2 g by mouth 2 (two) times daily., Disp: , Rfl:  .  Omega-3 Fatty Acids (FISH OIL) 1000 MG CAPS, Take 2 capsules  by mouth 2 (two) times daily., Disp: , Rfl:  .  pantoprazole (PROTONIX) 40 MG tablet, TAKE (1) TABLET TWICE A DAY. (Patient taking differently: Take 40 mg by mouth 2 (two) times daily. ), Disp: 180 tablet, Rfl: 0 .  sevelamer carbonate (RENVELA) 800 MG tablet, Take 800 mg by mouth 3 (three) times daily with meals., Disp: , Rfl:  .  terbinafine (LAMISIL) 250 MG tablet, Take it 3 times weekly on dialysis day after dialysis (Patient taking differently: Take 250 mg by mouth See admin instructions. Take 1 tablet daily on dialysis days - Tues, Thurs, Sat), Disp: 36 tablet, Rfl: 1 .  tiotropium (SPIRIVA) 18 MCG inhalation capsule, Place 18 mcg into inhaler and inhale daily. , Disp: , Rfl:  .  warfarin (COUMADIN) 2 MG tablet, TAKE 2 TABLETS (4MG ) DAILY EXCEPT ON MONDAY AND THURS TAKE 2 & 1/2 (5MG ) (Patient taking differently: Take 4 mg by mouth daily. ), Disp: 70 tablet, Rfl: 0   Garner Nash, DO [facility] 09/17/2019 4:22 PM

## 2019-09-18 ENCOUNTER — Encounter: Payer: Self-pay | Admitting: Pulmonary Disease

## 2019-09-18 ENCOUNTER — Telehealth: Payer: Self-pay

## 2019-09-18 ENCOUNTER — Other Ambulatory Visit: Payer: Self-pay | Admitting: Family Medicine

## 2019-09-18 ENCOUNTER — Ambulatory Visit (INDEPENDENT_AMBULATORY_CARE_PROVIDER_SITE_OTHER): Payer: Medicare HMO | Admitting: Licensed Clinical Social Worker

## 2019-09-18 ENCOUNTER — Other Ambulatory Visit (HOSPITAL_COMMUNITY)
Admission: RE | Admit: 2019-09-18 | Discharge: 2019-09-18 | Disposition: A | Discharge status: Home / Self Care | Payer: Medicare HMO | Source: Ambulatory Visit | Attending: Pulmonary Disease | Admitting: Pulmonary Disease

## 2019-09-18 DIAGNOSIS — F339 Major depressive disorder, recurrent, unspecified: Secondary | ICD-10-CM | POA: Diagnosis not present

## 2019-09-18 DIAGNOSIS — J441 Chronic obstructive pulmonary disease with (acute) exacerbation: Secondary | ICD-10-CM | POA: Diagnosis not present

## 2019-09-18 DIAGNOSIS — I10 Essential (primary) hypertension: Secondary | ICD-10-CM

## 2019-09-18 DIAGNOSIS — R531 Weakness: Secondary | ICD-10-CM

## 2019-09-18 DIAGNOSIS — C3492 Malignant neoplasm of unspecified part of left bronchus or lung: Secondary | ICD-10-CM | POA: Insufficient documentation

## 2019-09-18 DIAGNOSIS — B351 Tinea unguium: Secondary | ICD-10-CM

## 2019-09-18 DIAGNOSIS — Z01812 Encounter for preprocedural laboratory examination: Secondary | ICD-10-CM | POA: Diagnosis not present

## 2019-09-18 DIAGNOSIS — N186 End stage renal disease: Secondary | ICD-10-CM | POA: Diagnosis not present

## 2019-09-18 DIAGNOSIS — Z20828 Contact with and (suspected) exposure to other viral communicable diseases: Secondary | ICD-10-CM | POA: Insufficient documentation

## 2019-09-18 DIAGNOSIS — R413 Other amnesia: Secondary | ICD-10-CM

## 2019-09-18 DIAGNOSIS — R296 Repeated falls: Secondary | ICD-10-CM

## 2019-09-18 LAB — SARS CORONAVIRUS 2 (TAT 6-24 HRS): SARS Coronavirus 2: NEGATIVE

## 2019-09-18 NOTE — Telephone Encounter (Signed)
Call made to patient, made aware of PET scan date location and time. Voiced understanding. Nothing further needed at this time.

## 2019-09-18 NOTE — Patient Instructions (Addendum)
Licensed Clinical Social Worker Visit Information  Goals we discussed today:  Goals    . "I get sad sometimes because I live alone" (pt-stated)     Current Barriers:   Client has transportation needs . Client has some ambulation challenges (uses cane to help with ambulation) . Social isolation issues  Clinical Social Work Clinical Goal(s): Over the next 30 days, client will verbalize understanding of depression symptoms management.  Interventions: . Previously discussed self care activities: sleep hygiene, basic healthy eating practices . Encouraged client previously to participate in recreational/relaxation activities of choice  . Previously encouraged client or POA to call LCSW as needed to discuss management of depression symptoms of client . Encouraged client previously to use relaxation techniques (watching TV,talking via phone with [name] [facility])  Talked previously with client about current social work needs of client  Collaborated with RNCM to discuss client's recent referral to [employer] (Palliative Care Support)     Patient Self Care Activities:  . Client socializes with family weekly as scheduled.  . Client contacts [name], step-son, POA, as needed for daily needs/assistance of client. . Client takes medications as prescribed.  Plan:  . LCSW to contact client/POA [name]) in three weeks to discuss client needs related to depression management.  [name] [facility]) to contact LCSW related to psychosocial needs of client . [name] Client to use relaxation techniques of choice to help him manage depression symptoms.  . Client to attend scheduled medical appointments . Client or POA [name] to communicate with RN CM as needed to discuss nursing needs of client . Client to practice self care activities (eating meals on normal scheduled times, getting enough rest at night)       Materials Provided: No  Follow Up Plan:   LCSW to contact  client/POA [name]) in three weeks to discuss client needs related to depression management.   LCSW was not able to speak today via phone with client. Thus, the patient was not able to verbalize understanding of instructions provided today and was not able to accept or  decline a print copy of patient instruction materials.   [name].Isom Kochan MSW, LCSW Licensed Clinical Social Worker [facility] [phone]

## 2019-09-18 NOTE — Chronic Care Management (AMB) (Signed)
Care Management Note   Don [name] is a 78 y.o. year old male who is a primary care patient of Dettinger, Fransisca Kaufmann, MD. The CM team was consulted for assistance with chronic disease management and care coordination.   I reached out to Don [name] by phone today.   Review of patient status, including review of consultants reports, relevant laboratory and other test results, and collaboration with appropriate care team members and the patient's provider was performed as part of comprehensive patient evaluation and provision of chronic care management services.  Social determinants of health: risk of social isolation; risk of tobacco use; risk of physical inactivity    Chronic Care Management from 12/23/2018 in [facility]  PHQ-9 Total Score  9     GAD 7 : Generalized Anxiety Score 05/25/2016  Nervous, Anxious, on Edge 1  Control/stop worrying 2  Worry too much - different things 2  Trouble relaxing 1  Restless 0  Easily annoyed or irritable 1  Afraid - awful might happen 0  Total GAD 7 Score 7  Anxiety Difficulty Not difficult at all   Medications    acetaminophen (TYLENOL) 325 MG tablet    albuterol (PROAIR HFA) 108 (90 Base) MCG/ACT inhaler    atorvastatin (LIPITOR) 20 MG tablet    bisoprolol (ZEBETA) 10 MG tablet    budesonide-formoterol (SYMBICORT) 160-4.5 MCG/ACT inhaler    calcitRIOL (ROCALTROL) 0.25 MCG capsule    clonazePAM (KLONOPIN) 1 MG tablet    cyclobenzaprine (FLEXERIL) 10 MG tablet    diltiazem (CARDIZEM CD) 120 MG 24 hr capsule    ferrous sulfate 325 (65 FE) MG tablet    fluticasone (FLONASE) 50 MCG/ACT nasal spray    hydroxypropyl methylcellulose / hypromellose (ISOPTO TEARS / GONIOVISC) 2.5 % ophthalmic solution    iron polysaccharides (FERREX 150) 150 MG capsule    lidocaine-prilocaine (EMLA) cream    loratadine (CLARITIN) 10 MG tablet    Melatonin 3 MG TABS    Multiple Vitamin (MULTIVITAMIN WITH MINERALS) TABS tablet    Multiple Vitamins-Minerals (MENS ONE DAILY PO)    Multiple Vitamins-Minerals (PRESERVISION AREDS 2 PO)    Multiple Vitamins-Minerals (PRESERVISION AREDS 2+MULTI VIT) CAPS    multivitamin (RENA-VIT) TABS tablet    Nutritional Supplements (FEEDING SUPPLEMENT, NEPRO CARB STEADY,) LIQD    omega-3 acid ethyl esters (LOVAZA) 1 g capsule    Omega-3 Fatty Acids (FISH OIL) 1000 MG CAPS    pantoprazole (PROTONIX) 40 MG tablet    sevelamer carbonate (RENVELA) 800 MG tablet    terbinafine (LAMISIL) 250 MG tablet    tiotropium (SPIRIVA) 18 MCG inhalation capsule    warfarin (COUMADIN) 2 MG tablet     Goals        . "I get sad sometimes because I live alone" (pt-stated)     Current Barriers:   Client has transportation needs . Client has some ambulation challenges (uses cane to help with ambulation) . Social isolation issues  Clinical Social Work Clinical Goal(s): Over the next 30 days, client will verbalize understanding of depression symptoms management.  Interventions: . Encouraged client previously to participate in recreational/relaxation activities of choice  . Previously encouraged client or POA to call LCSW as needed to discuss management of depression symptoms of client . Encouraged client previously to use relaxation techniques (watching TV,talking via phone with Don [name] ([place]) . Talked previously with client about current social work needs of client . Collaborated with RNCM to discuss client's recent referral to [facility]  [facility] (Palliative Care Support)    Patient Self Care Activities:  . Client socializes with family weekly as scheduled.  . Client contacts Don [name], step-son, POA, as needed for daily needs/assistance of client. . Client takes medications as prescribed.  Plan:  . LCSW to contact client/POA Don [name]) in three weeks to discuss client needs related to depression management.                               [name]) to contact LCSW related  to psychosocial needs of client . [name] Client to use relaxation techniques of choice to help him manage depression symptoms.  . Client to attend scheduled medical appointments . Client or POA Don [name] to communicate with RN CM as needed to discuss nursing needs of client . Client to practice self care activities (eating meals on normal scheduled times, getting enough rest at night)                   Follow Up Plan: LCSW to contact client/POA Don [name]) in three weeks to discuss client needs related to depression management.        [name].Lylian Sanagustin MSW, LCSW Licensed Clinical Social Worker [facility] Care Management [phone]

## 2019-09-19 ENCOUNTER — Other Ambulatory Visit: Payer: Self-pay

## 2019-09-19 ENCOUNTER — Other Ambulatory Visit (HOSPITAL_COMMUNITY)
Admission: RE | Admit: 2019-09-19 | Discharge: 2019-09-19 | Disposition: A | Discharge status: Home / Self Care | Payer: Medicare HMO | Source: Ambulatory Visit | Attending: Pulmonary Disease | Admitting: Pulmonary Disease

## 2019-09-19 DIAGNOSIS — G8929 Other chronic pain: Secondary | ICD-10-CM | POA: Diagnosis not present

## 2019-09-19 DIAGNOSIS — R079 Chest pain, unspecified: Secondary | ICD-10-CM | POA: Diagnosis not present

## 2019-09-19 DIAGNOSIS — N186 End stage renal disease: Secondary | ICD-10-CM | POA: Diagnosis not present

## 2019-09-19 DIAGNOSIS — I4891 Unspecified atrial fibrillation: Secondary | ICD-10-CM | POA: Diagnosis not present

## 2019-09-19 DIAGNOSIS — F0390 Unspecified dementia without behavioral disturbance: Secondary | ICD-10-CM | POA: Diagnosis not present

## 2019-09-19 DIAGNOSIS — I5033 Acute on chronic diastolic (congestive) heart failure: Secondary | ICD-10-CM | POA: Diagnosis not present

## 2019-09-19 DIAGNOSIS — J449 Chronic obstructive pulmonary disease, unspecified: Secondary | ICD-10-CM | POA: Diagnosis not present

## 2019-09-19 DIAGNOSIS — H3530 Unspecified macular degeneration: Secondary | ICD-10-CM | POA: Diagnosis not present

## 2019-09-19 DIAGNOSIS — I132 Hypertensive heart and chronic kidney disease with heart failure and with stage 5 chronic kidney disease, or end stage renal disease: Secondary | ICD-10-CM | POA: Diagnosis not present

## 2019-09-22 ENCOUNTER — Other Ambulatory Visit: Payer: Self-pay | Admitting: Family Medicine

## 2019-09-22 ENCOUNTER — Ambulatory Visit (HOSPITAL_COMMUNITY)
Admission: RE | Admit: 2019-09-22 | Discharge: 2019-09-22 | Disposition: A | Discharge status: Home / Self Care | Payer: PRIVATE HEALTH INSURANCE | Source: Ambulatory Visit | Attending: Pulmonary Disease | Admitting: Pulmonary Disease

## 2019-09-22 ENCOUNTER — Other Ambulatory Visit: Payer: Self-pay

## 2019-09-22 DIAGNOSIS — R918 Other nonspecific abnormal finding of lung field: Secondary | ICD-10-CM | POA: Insufficient documentation

## 2019-09-22 LAB — PULMONARY FUNCTION TEST
DL/VA % pred: 72 %
DL/VA: 2.82 ml/min/mmHg/L
DLCO cor % pred: 46 %
DLCO cor: 11.13 ml/min/mmHg
DLCO unc % pred: 39 %
DLCO unc: 9.41 ml/min/mmHg
FEF 25-75 Post: 0.77 L/sec
FEF 25-75 Pre: 0.47 L/sec
FEF2575-%Change-Post: 62 %
FEF2575-%Pred-Post: 39 %
FEF2575-%Pred-Pre: 24 %
FEV1-%Change-Post: 11 %
FEV1-%Pred-Post: 49 %
FEV1-%Pred-Pre: 44 %
FEV1-Post: 1.4 L
FEV1-Pre: 1.25 L
FEV1FVC-%Change-Post: 0 %
FEV1FVC-%Pred-Pre: 81 %
FEV6-%Change-Post: 11 %
FEV6-%Pred-Post: 61 %
FEV6-%Pred-Pre: 55 %
FEV6-Post: 2.25 L
FEV6-Pre: 2.02 L
FEV6FVC-%Change-Post: 1 %
FEV6FVC-%Pred-Post: 102 %
FEV6FVC-%Pred-Pre: 100 %
FVC-%Change-Post: 10 %
FVC-%Pred-Post: 60 %
FVC-%Pred-Pre: 54 %
FVC-Post: 2.39 L
FVC-Pre: 2.16 L
Post FEV1/FVC ratio: 58 %
Post FEV6/FVC ratio: 96 %
Pre FEV1/FVC ratio: 58 %
Pre FEV6/FVC Ratio: 94 %
RV % pred: 214 %
RV: 5.65 L
TLC % pred: 114 %
TLC: 7.97 L

## 2019-09-22 MED ORDER — ALBUTEROL SULFATE (2.5 MG/3ML) 0.083% IN NEBU
2.5000 mg | INHALATION_SOLUTION | Freq: Once | RESPIRATORY_TRACT | Status: AC
  Administered 2019-09-22: 2.5 mg via RESPIRATORY_TRACT

## 2019-09-23 ENCOUNTER — Other Ambulatory Visit: Payer: Self-pay

## 2019-09-23 ENCOUNTER — Other Ambulatory Visit: Payer: Self-pay | Admitting: Genetic Counselor

## 2019-09-23 ENCOUNTER — Other Ambulatory Visit (HOSPITAL_COMMUNITY)
Admission: RE | Admit: 2019-09-23 | Discharge: 2019-09-23 | Disposition: A | Discharge status: Home / Self Care | Payer: Medicare HMO | Source: Ambulatory Visit | Attending: Pulmonary Disease | Admitting: Pulmonary Disease

## 2019-09-23 DIAGNOSIS — Z20828 Contact with and (suspected) exposure to other viral communicable diseases: Secondary | ICD-10-CM | POA: Diagnosis not present

## 2019-09-23 DIAGNOSIS — Z01812 Encounter for preprocedural laboratory examination: Secondary | ICD-10-CM | POA: Insufficient documentation

## 2019-09-23 DIAGNOSIS — Z8546 Personal history of malignant neoplasm of prostate: Secondary | ICD-10-CM

## 2019-09-23 LAB — SARS CORONAVIRUS 2 (TAT 6-24 HRS): SARS Coronavirus 2: NEGATIVE

## 2019-09-24 DIAGNOSIS — H3530 Unspecified macular degeneration: Secondary | ICD-10-CM | POA: Diagnosis not present

## 2019-09-24 DIAGNOSIS — G8929 Other chronic pain: Secondary | ICD-10-CM | POA: Diagnosis not present

## 2019-09-24 DIAGNOSIS — R079 Chest pain, unspecified: Secondary | ICD-10-CM | POA: Diagnosis not present

## 2019-09-24 DIAGNOSIS — N186 End stage renal disease: Secondary | ICD-10-CM | POA: Diagnosis not present

## 2019-09-24 DIAGNOSIS — I132 Hypertensive heart and chronic kidney disease with heart failure and with stage 5 chronic kidney disease, or end stage renal disease: Secondary | ICD-10-CM | POA: Diagnosis not present

## 2019-09-24 DIAGNOSIS — J449 Chronic obstructive pulmonary disease, unspecified: Secondary | ICD-10-CM | POA: Diagnosis not present

## 2019-09-24 DIAGNOSIS — I4891 Unspecified atrial fibrillation: Secondary | ICD-10-CM | POA: Diagnosis not present

## 2019-09-24 DIAGNOSIS — I5033 Acute on chronic diastolic (congestive) heart failure: Secondary | ICD-10-CM | POA: Diagnosis not present

## 2019-09-24 DIAGNOSIS — F0390 Unspecified dementia without behavioral disturbance: Secondary | ICD-10-CM | POA: Diagnosis not present

## 2019-09-25 ENCOUNTER — Encounter (HOSPITAL_COMMUNITY): Payer: Self-pay | Admitting: *Deleted

## 2019-09-25 ENCOUNTER — Other Ambulatory Visit: Payer: Self-pay

## 2019-09-25 NOTE — Progress Notes (Signed)
I called Mr. Magallon cell number and I reached [name], patient's step- son; Mr Hassell Done is listed as a contact person.  Mr Hassell Done reports that patient has dementia . Mr Hassell Done states that Mr Isaza's medications are in a sealed blister package. Mr Hassell Done also said that there is a description of the medication on the package so that he can identify each mediation. I gave Mr Hassell Done instructions regarding what medications patient should take in am.

## 2019-09-25 NOTE — Anesthesia Preprocedure Evaluation (Addendum)
Anesthesia Evaluation  Patient identified by MRN, date of birth, ID band Patient awake    Reviewed: Allergy & Precautions, NPO status , Patient's Chart, lab work & pertinent test results, reviewed documented beta blocker date and time   Airway Mallampati: III  TM Distance: >3 FB Neck ROM: Full    Dental  (+) Edentulous Upper, Edentulous Lower   Pulmonary COPD, former smoker,    Pulmonary exam normal breath sounds clear to auscultation       Cardiovascular hypertension, Pt. on medications and Pt. 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  Reproductive/Obstetrics                            Anesthesia Physical  Anesthesia Plan  ASA: IV  Anesthesia Plan: General   Post-op Pain Management:    Induction: Intravenous  PONV Risk Score and Plan: 2 and Ondansetron and Dexamethasone  Airway Management Planned: Oral ETT  Additional Equipment: None  Intra-op Plan:   Post-operative Plan: Extubation in OR  Informed Consent: I have reviewed the patients History and Physical, chart, labs and discussed the procedure including the risks, benefits and alternatives for the proposed anesthesia with the patient or authorized representative who has indicated his/her understanding and acceptance.     Dental advisory given  Plan Discussed with: CRNA  Anesthesia Plan  Comments:         Anesthesia Quick Evaluation

## 2019-09-26 ENCOUNTER — Encounter (HOSPITAL_COMMUNITY)
Admission: RE | Disposition: A | Discharge status: Home / Self Care | Payer: Self-pay | Source: Ambulatory Visit | Attending: Pulmonary Disease

## 2019-09-26 ENCOUNTER — Other Ambulatory Visit: Payer: Self-pay

## 2019-09-26 ENCOUNTER — Encounter (HOSPITAL_COMMUNITY): Payer: Self-pay

## 2019-09-26 ENCOUNTER — Ambulatory Visit (HOSPITAL_COMMUNITY): Payer: No Typology Code available for payment source

## 2019-09-26 ENCOUNTER — Ambulatory Visit (HOSPITAL_COMMUNITY): Payer: No Typology Code available for payment source | Admitting: Anesthesiology

## 2019-09-26 ENCOUNTER — Ambulatory Visit (HOSPITAL_COMMUNITY)
Admission: RE | Admit: 2019-09-26 | Discharge: 2019-09-26 | Disposition: A | Discharge status: Home / Self Care | Payer: No Typology Code available for payment source | Source: Ambulatory Visit | Attending: Pulmonary Disease | Admitting: Pulmonary Disease

## 2019-09-26 ENCOUNTER — Telehealth: Payer: Self-pay | Admitting: *Deleted

## 2019-09-26 DIAGNOSIS — I132 Hypertensive heart and chronic kidney disease with heart failure and with stage 5 chronic kidney disease, or end stage renal disease: Secondary | ICD-10-CM | POA: Insufficient documentation

## 2019-09-26 DIAGNOSIS — Z419 Encounter for procedure for purposes other than remedying health state, unspecified: Secondary | ICD-10-CM

## 2019-09-26 DIAGNOSIS — R918 Other nonspecific abnormal finding of lung field: Secondary | ICD-10-CM

## 2019-09-26 DIAGNOSIS — R911 Solitary pulmonary nodule: Secondary | ICD-10-CM | POA: Insufficient documentation

## 2019-09-26 DIAGNOSIS — N186 End stage renal disease: Secondary | ICD-10-CM | POA: Diagnosis not present

## 2019-09-26 DIAGNOSIS — C3432 Malignant neoplasm of lower lobe, left bronchus or lung: Secondary | ICD-10-CM | POA: Diagnosis not present

## 2019-09-26 DIAGNOSIS — Z8546 Personal history of malignant neoplasm of prostate: Secondary | ICD-10-CM | POA: Insufficient documentation

## 2019-09-26 DIAGNOSIS — N179 Acute kidney failure, unspecified: Secondary | ICD-10-CM | POA: Diagnosis not present

## 2019-09-26 DIAGNOSIS — I4891 Unspecified atrial fibrillation: Secondary | ICD-10-CM | POA: Diagnosis not present

## 2019-09-26 DIAGNOSIS — Z7951 Long term (current) use of inhaled steroids: Secondary | ICD-10-CM | POA: Insufficient documentation

## 2019-09-26 DIAGNOSIS — Z79899 Other long term (current) drug therapy: Secondary | ICD-10-CM | POA: Diagnosis not present

## 2019-09-26 DIAGNOSIS — F0390 Unspecified dementia without behavioral disturbance: Secondary | ICD-10-CM | POA: Diagnosis not present

## 2019-09-26 DIAGNOSIS — Z801 Family history of malignant neoplasm of trachea, bronchus and lung: Secondary | ICD-10-CM | POA: Diagnosis not present

## 2019-09-26 DIAGNOSIS — Z7901 Long term (current) use of anticoagulants: Secondary | ICD-10-CM | POA: Insufficient documentation

## 2019-09-26 DIAGNOSIS — I509 Heart failure, unspecified: Secondary | ICD-10-CM | POA: Diagnosis not present

## 2019-09-26 DIAGNOSIS — F419 Anxiety disorder, unspecified: Secondary | ICD-10-CM | POA: Diagnosis not present

## 2019-09-26 DIAGNOSIS — J90 Pleural effusion, not elsewhere classified: Secondary | ICD-10-CM | POA: Diagnosis not present

## 2019-09-26 DIAGNOSIS — E785 Hyperlipidemia, unspecified: Secondary | ICD-10-CM | POA: Insufficient documentation

## 2019-09-26 DIAGNOSIS — I5033 Acute on chronic diastolic (congestive) heart failure: Secondary | ICD-10-CM | POA: Diagnosis not present

## 2019-09-26 DIAGNOSIS — Z87891 Personal history of nicotine dependence: Secondary | ICD-10-CM | POA: Insufficient documentation

## 2019-09-26 DIAGNOSIS — D631 Anemia in chronic kidney disease: Secondary | ICD-10-CM | POA: Insufficient documentation

## 2019-09-26 DIAGNOSIS — J449 Chronic obstructive pulmonary disease, unspecified: Secondary | ICD-10-CM | POA: Diagnosis not present

## 2019-09-26 DIAGNOSIS — Z992 Dependence on renal dialysis: Secondary | ICD-10-CM | POA: Diagnosis not present

## 2019-09-26 DIAGNOSIS — Z9889 Other specified postprocedural states: Secondary | ICD-10-CM | POA: Diagnosis not present

## 2019-09-26 HISTORY — PX: VIDEO BRONCHOSCOPY WITH ENDOBRONCHIAL NAVIGATION: SHX6175

## 2019-09-26 HISTORY — DX: Dyspnea, unspecified: R06.00

## 2019-09-26 HISTORY — PX: VIDEO BRONCHOSCOPY WITH ENDOBRONCHIAL ULTRASOUND: SHX6177

## 2019-09-26 LAB — CBC
HCT: 30.5 % — ABNORMAL LOW (ref 39.0–52.0)
Hemoglobin: 9.4 g/dL — ABNORMAL LOW (ref 13.0–17.0)
MCH: 31.8 pg (ref 26.0–34.0)
MCHC: 30.8 g/dL (ref 30.0–36.0)
MCV: 103 fL — ABNORMAL HIGH (ref 80.0–100.0)
Platelets: 304 10*3/uL (ref 150–400)
RBC: 2.96 MIL/uL — ABNORMAL LOW (ref 4.22–5.81)
RDW: 15.4 % (ref 11.5–15.5)
WBC: 9.6 10*3/uL (ref 4.0–10.5)
nRBC: 0 % (ref 0.0–0.2)

## 2019-09-26 LAB — COMPREHENSIVE METABOLIC PANEL
ALT: 12 U/L (ref 0–44)
AST: 15 U/L (ref 15–41)
Albumin: 2.3 g/dL — ABNORMAL LOW (ref 3.5–5.0)
Alkaline Phosphatase: 118 U/L (ref 38–126)
Anion gap: 15 (ref 5–15)
BUN: 25 mg/dL — ABNORMAL HIGH (ref 8–23)
CO2: 27 mmol/L (ref 22–32)
Calcium: 8.7 mg/dL — ABNORMAL LOW (ref 8.9–10.3)
Chloride: 96 mmol/L — ABNORMAL LOW (ref 98–111)
Creatinine, Ser: 4.22 mg/dL — ABNORMAL HIGH (ref 0.61–1.24)
GFR calc Af Amer: 14 mL/min — ABNORMAL LOW (ref >60)
GFR calc non Af Amer: 12 mL/min — ABNORMAL LOW (ref >60)
Glucose, Bld: 101 mg/dL — ABNORMAL HIGH (ref 70–99)
Potassium: 3.9 mmol/L (ref 3.5–5.1)
Sodium: 138 mmol/L (ref 135–145)
Total Bilirubin: 0.6 mg/dL (ref 0.3–1.2)
Total Protein: 5.7 g/dL — ABNORMAL LOW (ref 6.5–8.1)

## 2019-09-26 LAB — SURGICAL PCR SCREEN
MRSA, PCR: POSITIVE — AB
Staphylococcus aureus: POSITIVE — AB

## 2019-09-26 LAB — PROTIME-INR
INR: 1.4 — ABNORMAL HIGH (ref 0.8–1.2)
Prothrombin Time: 16.6 seconds — ABNORMAL HIGH (ref 11.4–15.2)

## 2019-09-26 LAB — APTT: aPTT: 46 seconds — ABNORMAL HIGH (ref 24–36)

## 2019-09-26 SURGERY — VIDEO BRONCHOSCOPY WITH ENDOBRONCHIAL NAVIGATION
Anesthesia: General | Site: Chest

## 2019-09-26 MED ORDER — FENTANYL CITRATE (PF) 100 MCG/2ML IJ SOLN: 25.0000 ug | INTRAMUSCULAR | Status: DC | PRN

## 2019-09-26 MED ORDER — MEPERIDINE HCL 25 MG/ML IJ SOLN: 6.2500 mg | INTRAMUSCULAR | Status: DC | PRN

## 2019-09-26 MED ORDER — BISOPROLOL FUMARATE 10 MG PO TABS
10.0000 mg | ORAL_TABLET | Freq: Once | ORAL | Status: AC
  Administered 2019-09-26: 07:00:00 10 mg via ORAL
  Filled 2019-09-26: qty 1

## 2019-09-26 MED ORDER — LIDOCAINE 2% (20 MG/ML) 5 ML SYRINGE
INTRAMUSCULAR | Status: DC | PRN
  Administered 2019-09-26: 100 mg via INTRAVENOUS

## 2019-09-26 MED ORDER — SODIUM CHLORIDE 0.9 % IV SOLN
0.0125 ug/kg/min | INTRAVENOUS | Status: AC
  Administered 2019-09-26 (×2): .05 ug/kg/min via INTRAVENOUS
  Filled 2019-09-26: qty 2000

## 2019-09-26 MED ORDER — DEXAMETHASONE SODIUM PHOSPHATE 10 MG/ML IJ SOLN
INTRAMUSCULAR | Status: DC | PRN
  Administered 2019-09-26: 4 mg via INTRAVENOUS

## 2019-09-26 MED ORDER — SODIUM CHLORIDE 0.9 % IV SOLN
INTRAVENOUS | Status: DC | PRN
  Administered 2019-09-26: 07:00:00 via INTRAVENOUS

## 2019-09-26 MED ORDER — PROPOFOL 10 MG/ML IV BOLUS
INTRAVENOUS | Status: DC | PRN
  Administered 2019-09-26: 20 mg via INTRAVENOUS
  Administered 2019-09-26: 80 mg via INTRAVENOUS
  Administered 2019-09-26: 20 mg via INTRAVENOUS

## 2019-09-26 MED ORDER — PROPOFOL 10 MG/ML IV BOLUS
INTRAVENOUS | Status: AC
  Filled 2019-09-26: qty 40

## 2019-09-26 MED ORDER — PHENYLEPHRINE HCL-NACL 10-0.9 MG/250ML-% IV SOLN
INTRAVENOUS | Status: DC | PRN
  Administered 2019-09-26: 25 ug/min via INTRAVENOUS

## 2019-09-26 MED ORDER — SUCCINYLCHOLINE CHLORIDE 20 MG/ML IJ SOLN
INTRAMUSCULAR | Status: DC | PRN
  Administered 2019-09-26: 80 mg via INTRAVENOUS
  Administered 2019-09-26: 20 mg via INTRAVENOUS

## 2019-09-26 MED ORDER — ONDANSETRON HCL 4 MG/2ML IJ SOLN: 4.0000 mg | Freq: Once | INTRAMUSCULAR | Status: DC | PRN

## 2019-09-26 MED ORDER — PHENYLEPHRINE HCL (PRESSORS) 10 MG/ML IV SOLN
INTRAVENOUS | Status: DC | PRN
  Administered 2019-09-26 (×2): 80 ug via INTRAVENOUS
  Administered 2019-09-26: 40 ug via INTRAVENOUS
  Administered 2019-09-26 (×2): 80 ug via INTRAVENOUS
  Administered 2019-09-26: 120 ug via INTRAVENOUS
  Administered 2019-09-26: 80 ug via INTRAVENOUS

## 2019-09-26 MED ORDER — FENTANYL CITRATE (PF) 100 MCG/2ML IJ SOLN
INTRAMUSCULAR | Status: DC | PRN
  Administered 2019-09-26: 25 ug via INTRAVENOUS

## 2019-09-26 MED ORDER — FENTANYL CITRATE (PF) 250 MCG/5ML IJ SOLN
INTRAMUSCULAR | Status: AC
  Filled 2019-09-26: qty 5

## 2019-09-26 MED ORDER — ACETAMINOPHEN 325 MG PO TABS
650.0000 mg | ORAL_TABLET | Freq: Once | ORAL | Status: AC
  Administered 2019-09-26: 11:00:00 650 mg via ORAL

## 2019-09-26 MED ORDER — 0.9 % SODIUM CHLORIDE (POUR BTL) OPTIME
TOPICAL | Status: DC | PRN
  Administered 2019-09-26: 1000 mL

## 2019-09-26 MED ORDER — ONDANSETRON HCL 4 MG/2ML IJ SOLN
INTRAMUSCULAR | Status: DC | PRN
  Administered 2019-09-26: 4 mg via INTRAVENOUS

## 2019-09-26 MED ORDER — ACETAMINOPHEN 325 MG PO TABS
ORAL_TABLET | ORAL | Status: AC
  Filled 2019-09-26: qty 2

## 2019-09-26 SURGICAL SUPPLY — 58 items
ADAPTER BRONCHOSCOPE OLYMPUS (ADAPTER) ×3 IMPLANT
ADAPTER VALVE BIOPSY EBUS (MISCELLANEOUS) IMPLANT
ADPTR VALVE BIOPSY EBUS (MISCELLANEOUS)
BALLN FOR EBUS SCOPE (BALLOONS) ×3
BALLOON FOR EBUS SCOPE (BALLOONS) ×1 IMPLANT
BRUSH CYTOL CELLEBRITY 1.5X140 (MISCELLANEOUS) ×3 IMPLANT
BRUSH SUPERTRAX BIOPSY (INSTRUMENTS) IMPLANT
BRUSH SUPERTRAX NDL-TIP CYTO (INSTRUMENTS) ×3 IMPLANT
CANISTER SUCT 3000ML PPV (MISCELLANEOUS) ×3 IMPLANT
CHANNEL WORK EXTEND EDGE 180 (KITS) IMPLANT
CHANNEL WORK EXTEND EDGE 90 (KITS) IMPLANT
CONT SPEC 4OZ CLIKSEAL STRL BL (MISCELLANEOUS) ×6 IMPLANT
COVER BACK TABLE 60X90IN (DRAPES) ×3 IMPLANT
COVER WAND RF STERILE (DRAPES) ×3 IMPLANT
FILTER STRAW FLUID ASPIR (MISCELLANEOUS) IMPLANT
FORCEPS BIOP RJ4 1.8 (CUTTING FORCEPS) IMPLANT
FORCEPS BIOP SUPERTRX PREMAR (INSTRUMENTS) ×3 IMPLANT
GAUZE SPONGE 4X4 12PLY STRL (GAUZE/BANDAGES/DRESSINGS) ×3 IMPLANT
GLOVE BIOGEL PI IND STRL 6.5 (GLOVE) ×1 IMPLANT
GLOVE BIOGEL PI INDICATOR 6.5 (GLOVE) ×2
GLOVE SURG SS PI 6.5 STRL IVOR (GLOVE) ×3 IMPLANT
GLOVE SURG SS PI 7.5 STRL IVOR (GLOVE) ×6 IMPLANT
GOWN STRL NON-REIN LRG LVL3 (GOWN DISPOSABLE) ×3 IMPLANT
GOWN STRL REUS W/ TWL LRG LVL3 (GOWN DISPOSABLE) ×2 IMPLANT
GOWN STRL REUS W/TWL LRG LVL3 (GOWN DISPOSABLE) ×4
KIT CLEAN ENDO COMPLIANCE (KITS) ×6 IMPLANT
KIT ILLUMISITE 90 PROCEDURE (KITS) ×3 IMPLANT
KIT LOCATABLE GUIDE (CANNULA) IMPLANT
KIT MARKER FIDUCIAL DELIVERY (KITS) IMPLANT
KIT PROCEDURE EDGE 180 (KITS) IMPLANT
KIT PROCEDURE EDGE 90 (KITS) IMPLANT
KIT TURNOVER KIT B (KITS) ×3 IMPLANT
MARKER SKIN DUAL TIP RULER LAB (MISCELLANEOUS) ×3 IMPLANT
NEEDLE ASPIRATION VIZISHOT 19G (NEEDLE) IMPLANT
NEEDLE ASPIRATION VIZISHOT 21G (NEEDLE) IMPLANT
NEEDLE BIOPSY TRANSBRONCH 21G (NEEDLE) ×3 IMPLANT
NEEDLE SUPERTRX PREMARK BIOPSY (NEEDLE) ×6 IMPLANT
NEEDLE WANG 19GA 15MM 130CM (NEEDLE) ×3 IMPLANT
NS IRRIG 1000ML POUR BTL (IV SOLUTION) ×3 IMPLANT
OIL SILICONE PENTAX (PARTS (SERVICE/REPAIRS)) ×3 IMPLANT
PAD ARMBOARD 7.5X6 YLW CONV (MISCELLANEOUS) ×6 IMPLANT
PATCHES PATIENT (LABEL) ×9 IMPLANT
STOPCOCK 4 WAY LG BORE MALE ST (IV SETS) ×3 IMPLANT
SYR 20ML ECCENTRIC (SYRINGE) ×6 IMPLANT
SYR 20ML LL LF (SYRINGE) ×3 IMPLANT
SYR 3ML LL SCALE MARK (SYRINGE) IMPLANT
SYR 50ML SLIP (SYRINGE) ×3 IMPLANT
SYR 5ML LL (SYRINGE) ×12 IMPLANT
TOWEL GREEN STERILE FF (TOWEL DISPOSABLE) ×3 IMPLANT
TRAP SPECIMEN MUCOUS 40CC (MISCELLANEOUS) ×3 IMPLANT
TUBE CONNECTING 20'X1/4 (TUBING) ×1
TUBE CONNECTING 20X1/4 (TUBING) ×2 IMPLANT
TUBING EXTENTION W/L.L. (IV SETS) ×3 IMPLANT
UNDERPAD 30X30 (UNDERPADS AND DIAPERS) ×3 IMPLANT
VALVE BIOPSY  SINGLE USE (MISCELLANEOUS) ×4
VALVE BIOPSY SINGLE USE (MISCELLANEOUS) ×2 IMPLANT
VALVE SUCTION BRONCHIO DISP (MISCELLANEOUS) ×6 IMPLANT
WATER STERILE IRR 1000ML POUR (IV SOLUTION) ×3 IMPLANT

## 2019-09-26 NOTE — Transfer of Care (Addendum)
Immediate Anesthesia Transfer of Care Note  Patient: Don [name]  Procedure(s) Performed: VIDEO BRONCHOSCOPY WITH ENDOBRONCHIAL NAVIGATION (N/A Chest) VIDEO BRONCHOSCOPY WITH ENDOBRONCHIAL ULTRASOUND (N/A Chest)  Patient Location: PACU  Anesthesia Type:General  Level of Consciousness: awake, alert , oriented and sedated  Airway & Oxygen Therapy: Patient Spontanous Breathing and Patient connected to face mask oxygen  Post-op Assessment: Report given to RN, Post -op Vital signs reviewed and stable and Patient moving all extremities  Post vital signs: Reviewed and stable  Last Vitals:  Vitals Value Taken Time  BP 91/61 09/26/19 0940  Temp    Pulse 102 09/26/19 0941  Resp 16 09/26/19 0941  SpO2 99 % 09/26/19 0941  Vitals shown include unvalidated device data.  Last Pain:  Vitals:   09/26/19 0618  TempSrc:   PainSc: 0-No pain      Patients Stated Pain Goal: 3 (41/96/22 2979)  Complications: No apparent anesthesia complications

## 2019-09-26 NOTE — Anesthesia Procedure Notes (Signed)
Procedure Name: Intubation Date/Time: 09/26/2019 8:00 AM Performed by: Scheryl Darter, CRNA Pre-anesthesia Checklist: Patient identified, Emergency Drugs available, Suction available and Patient being monitored Patient Re-evaluated:Patient Re-evaluated prior to induction Oxygen Delivery Method: Circle System Utilized Preoxygenation: Pre-oxygenation with 100% oxygen Induction Type: IV induction Ventilation: Mask ventilation without difficulty Laryngoscope Size: Mac and 4 Grade View: Grade II Tube type: Oral Tube size: 8.5 mm Number of attempts: 1 Airway Equipment and Method: Stylet and Oral airway Placement Confirmation: ETT inserted through vocal cords under direct vision,  positive ETCO2 and breath sounds checked- equal and bilateral Secured at: 23 cm Tube secured with: Tape Dental Injury: Teeth and Oropharynx as per pre-operative assessment

## 2019-09-26 NOTE — Interval H&P Note (Signed)
History and Physical Interval Note:  09/26/2019 7:13 AM  [name]  has presented today for surgery, with the diagnosis of LUNG MASS.  The various methods of treatment have been discussed with the patient and family. After consideration of risks, benefits and other options for treatment, the patient has consented to  Procedure(s): VIDEO BRONCHOSCOPY WITH ENDOBRONCHIAL NAVIGATION (N/A) VIDEO BRONCHOSCOPY WITH ENDOBRONCHIAL ULTRASOUND (N/A) as a surgical intervention.  [name] history has been reviewed, patient examined, no change in status, stable for surgery.  I have reviewed the patient's chart and labs.  Questions were answered to the patient's satisfaction.    Patient seen in pre-op. Doing well this morning. All questions answered. Coumadin held since Monday. INR 1.4 this morning. Discussed risks of bleeding and pneumothorax. Patient agreeable to proceed.   [place]

## 2019-09-26 NOTE — Anesthesia Postprocedure Evaluation (Signed)
Anesthesia Post Note  Patient: Don [name]  Procedure(s) Performed: VIDEO BRONCHOSCOPY WITH ENDOBRONCHIAL NAVIGATION (N/A Chest) VIDEO BRONCHOSCOPY WITH ENDOBRONCHIAL ULTRASOUND (N/A Chest)     Patient location during evaluation: PACU Anesthesia Type: General Level of consciousness: sedated and patient cooperative Pain management: pain level controlled Vital Signs Assessment: post-procedure vital signs reviewed and stable Respiratory status: spontaneous breathing Cardiovascular status: stable Anesthetic complications: no    Last Vitals:  Vitals:   09/26/19 0955 09/26/19 1024  BP: 100/62 112/73  Pulse: 94 100  Resp: 16 (!) 24  Temp:    SpO2: 94% 94%    Last Pain:  Vitals:   09/26/19 1024  TempSrc:   PainSc: 0-No pain                 Nolon Nations

## 2019-09-26 NOTE — Telephone Encounter (Signed)
Oncology Nurse Navigator Documentation  Oncology Nurse Navigator Flowsheets 09/26/2019  Navigator Location [facility]  Referral Date to RadOnc/MedOnc 09/25/2019  Navigator Encounter Type Telephone/I called Mr. Empey to schedule him an appt to see Dr. Julien Nordmann.  I gave him an appt for Thursday next week but has HD.  I contacted Dr. Julien Nordmann to see if he can be scheduled next Wednesday.    Telephone Outgoing Call  Treatment Phase Pre-Tx/Tx Discussion  Barriers/Navigation Needs Coordination of Care;Education  Education Other  Interventions Coordination of Care;Education  Acuity Level 2-Minimal Needs (1-2 Barriers Identified)  Coordination of Care Other  Education Method Verbal  Time Spent with Patient 30

## 2019-09-26 NOTE — Discharge Instructions (Signed)
Flexible Bronchoscopy, Care After This sheet gives you information about how to care for yourself after your test. Your doctor may also give you more specific instructions. If you have problems or questions, contact your doctor. Follow these instructions at home:  COUMADIN: You may restart your home dose of coumadin at your regular schedule.   Eating and drinking  The day after the test, go back to your normal diet. Driving  Do not drive for 24 hours if you were given a medicine to help you relax (sedative).  Do not drive or use heavy machinery while taking prescription pain medicine. General instructions   Take over-the-counter and prescription medicines only as told by your doctor.  Return to your normal activities as told. Ask what activities are safe for you.  Do not use any products that have nicotine or tobacco in them. This includes cigarettes and e-cigarettes. If you need help quitting, ask your doctor.  Keep all follow-up visits as told by your doctor. This is important. It is very important if you had a tissue sample (biopsy) taken. Get help right away if:  You have shortness of breath that gets worse.  You get light-headed.  You feel like you are going to pass out (faint).  You have chest pain.  You cough up: ? More than a little blood. ? More blood than before. Summary  Do not eat or drink anything (not even water) for 2 hours after your test, or until your numbing medicine wears off.  Do not use cigarettes. Do not use e-cigarettes.  Get help right away if you have chest pain. This information is not intended to replace advice given to you by your health care provider. Make sure you discuss any questions you have with your health care provider. Document Released: 08/27/2009 Document Revised: 10/12/2017 Document Reviewed: 11/17/2016 Elsevier Patient Education  2020 [employer].

## 2019-09-26 NOTE — Op Note (Signed)
Video Bronchoscopy with Electromagnetic Navigation, Endobronchial ultrasound procedure note  Date of Operation: 09/26/2019  Pre-op Diagnosis: Left lower lobe lung mass  Post-op Diagnosis: Left lower lobe lung mass  Surgeon: Garner Nash, DO   Assistants: None   Anesthesia: General endotracheal anesthesia  Operation: Flexible video fiberoptic bronchoscopy with electromagnetic navigation and biopsies.  Estimated Blood Loss: Minimal, <6EG  Complications: None   Indications and History: Don [name] is a 78 y.o. male with left lower lobe lung mass.  The risks, benefits, complications, treatment options and expected outcomes were discussed with the patient.  The possibilities of pneumothorax, pneumonia, reaction to medication, pulmonary aspiration, perforation of a viscus, bleeding, failure to diagnose a condition and creating a complication requiring transfusion or operation were discussed with the patient who freely signed the consent.    Description of Procedure: The patient was seen in the Preoperative Area, was examined and was deemed appropriate to proceed.  The patient was taken to [facility] 10, identified as Don [name] and the procedure verified as Flexible Video Fiberoptic Bronchoscopy.  A Time Out was held and the above information confirmed.   After being taken to the operating room general anesthesia was initiated and the patient  was orally intubated. The video fiberoptic bronchoscope was introduced via the endotracheal tube and a general inspection was performed which showed normal right left lung anatomy no evidence of endobronchial disease. The standard scope was then withdrawn and the endobronchial ultrasound was used to identify and characterize the peritracheal, hilar and bronchial lymph nodes. Inspection showed small 0.5 cm left hilar node, no visible nodal tissue within the subcarinal space, right hilum and pretracheal space clear.  No nodal stations were sampled. The  patient tolerated the procedure well without apparent complications.   Prior to the date of the procedure a high-resolution CT scan of the chest was performed. Utilizing [place] a virtual tracheobronchial tree was generated to allow the creation of distinct navigation pathways to the patient's parenchymal abnormalities. After being taken to the operating room general anesthesia was initiated and the patient  was orally intubated. The extendable working channel and locator guide were introduced into the bronchoscope. The distinct navigation pathways prepared prior to this procedure were then utilized to navigate to within 3 cm of patient's lesion(s) identified on CT scan.  We were unable to extend the sheath any closer to the lesion due to the multiple abutting proximal airways.  The extendable working channel was secured into place and the locator guide was withdrawn. Under fluoroscopic guidance a standard 19-gauge [employer] needle was introduced through the navigational catheter sheath and transbronchial needle aspirations were performed to be sent for cytology and pathology.  After a large enough hole was created in the proximal bifurcations of the left lower lobe airways we were able to pass the smaller [employer] needle aspirating device.  Initial attempts the needle was too short to pierce the bronchial epithelium.  Passing any additional instruments forced the tools down patent airways instead of piercing the epithelium towards the tumor.  Multiple transbronchial needle aspiration passes were made into the tumor and all placed into cellblock analysis. A bronchioalveolar lavage was performed in the left lower lobe and sent for cytology. At the end of the procedure a general airway inspection was performed and there was no evidence of active bleeding. The bronchoscope was removed.  The patient tolerated the procedure well. There was no significant blood loss and there were no  obvious complications. A  post-procedural chest x-ray is pending.  Samples: 1. Transbronchial needle aspiration biopsies left lower lobe 2.  Bronchioloalveolar lavage left lower lobe  Plans:  The patient will be discharged from the PACU to home when recovered from anesthesia and after chest x-ray is reviewed. We will review the cytology, pathology and microbiology results with the patient when they become available. Outpatient followup will be with Octavio Graves Icard, DO.   Preliminary pathology: Adequate for tissue diagnosis  Referral placed for medical oncology.  Garner Nash, DO [facility] 09/26/2019 9:35 AM

## 2019-09-27 IMAGING — US IR FLUORO GUIDE CV LINE*R*
1 series · 1 of 1 positions shown · non-contrast
Comparison: none

INDICATION: 77-year-old with end-stage renal disease and maturing AV arm access.
Patient needs a tunneled catheter for hemodialysis.

[Series 1: ir fluoro guide cv line*right* · 1 of 1 slices shown]
[im 1/1]
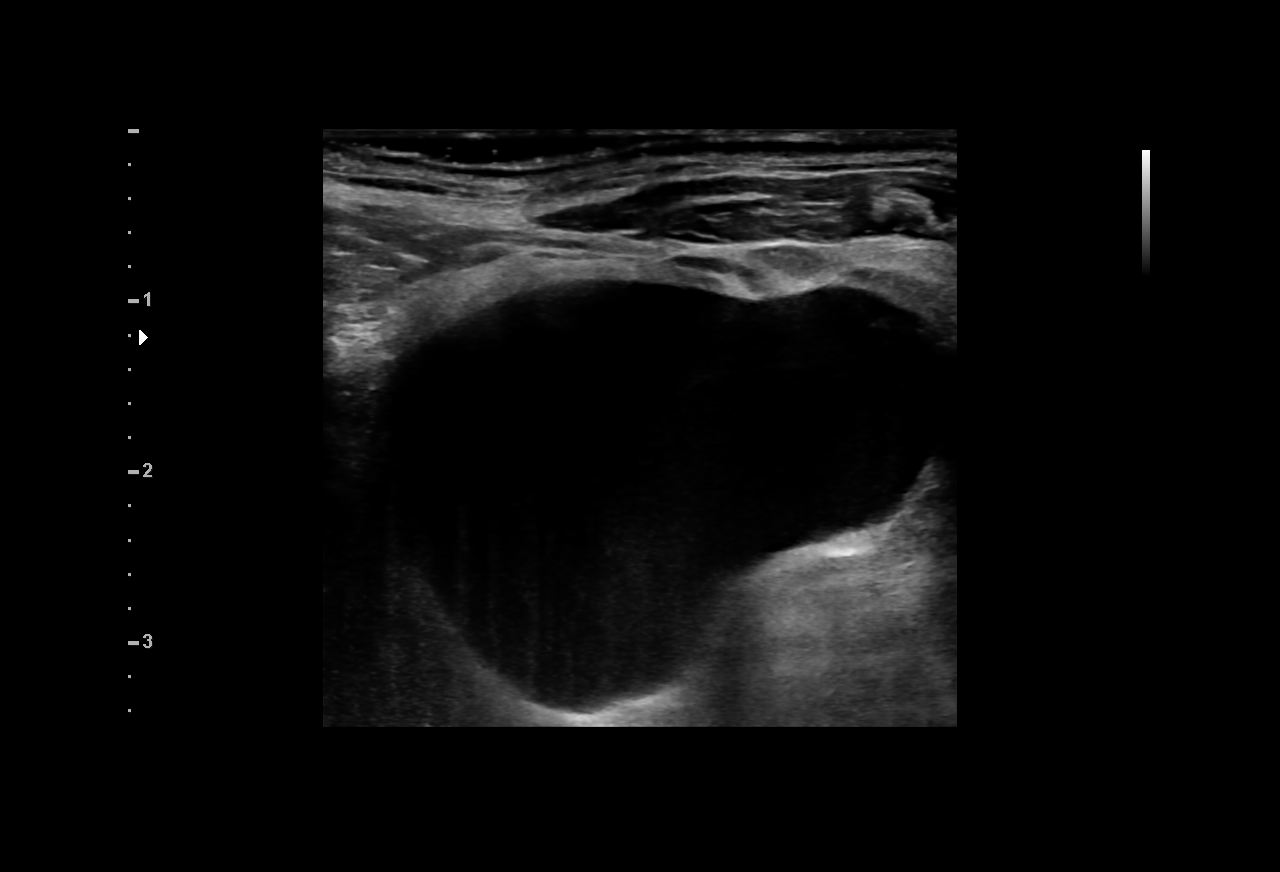

[1 of 1 positions shown; findings below may reference images not displayed]

EXAM:
FLUOROSCOPIC AND ULTRASOUND GUIDED PLACEMENT OF A TUNNELED DIALYSIS
CATHETER

MEDICATIONS:
Ancef 2 g; The antibiotic was administered within an appropriate
time interval prior to skin puncture.

ANESTHESIA/SEDATION:
Versed 2 mg IV; Fentanyl 100 mcg IV;

Moderate Sedation Time:  22 minutes

The patient was continuously monitored during the procedure by the
interventional radiology nurse under my direct supervision.

FLUOROSCOPY TIME:  Fluoroscopy Time: 30 seconds, 1 mGy

COMPLICATIONS:
None immediate.

PROCEDURE:
The procedure was explained to the patient. The risks and benefits
of the procedure were discussed and the patient's questions were
addressed. Informed consent was obtained from the patient. The
patient was placed supine on the interventional table. Ultrasound
confirmed a patent right internal jugular vein. Ultrasound images
were obtained for documentation. The right side of the neck and
chest was prepped and draped in a sterile fashion. The right neck
was anesthetized with 1% lidocaine. Maximal barrier sterile
technique was utilized including caps, mask, sterile gowns, sterile
gloves, sterile drape, hand hygiene and skin antiseptic. A small
incision was made with #11 blade scalpel. A 21 gauge needle directed
into the right internal jugular vein with ultrasound guidance. A
micropuncture dilator set was placed. A 23 cm tip to cuff Palindrome
catheter was selected. The skin below the right clavicle was
anesthetized and a small incision was made with an #11 blade
scalpel. A subcutaneous tunnel was formed to the vein dermatotomy
site. The catheter was brought through the tunnel. The vein
dermatotomy site was dilated to accommodate a peel-away sheath. The
catheter was placed through the peel-away sheath and directed into
the central venous structures. The tip of the catheter was placed at
the SVC/right atrium junction with fluoroscopy. Fluoroscopic images
were obtained for documentation. Both lumens were found to aspirate
and flush well. The proper amount of heparin was flushed in both
lumens. The vein dermatotomy site was closed using a single layer of
absorbable suture and Dermabond. The catheter was secured to the
skin using Prolene suture.
IMPRESSION: Successful placement of a right jugular tunneled dialysis catheter
using ultrasound and fluoroscopic guidance.

## 2019-09-29 ENCOUNTER — Inpatient Hospital Stay: Payer: Medicare HMO

## 2019-09-29 ENCOUNTER — Telehealth: Payer: Self-pay | Admitting: *Deleted

## 2019-09-29 ENCOUNTER — Ambulatory Visit (HOSPITAL_COMMUNITY)
Admission: RE | Admit: 2019-09-29 | Discharge: 2019-09-29 | Disposition: A | Discharge status: Home / Self Care | Payer: No Typology Code available for payment source | Source: Ambulatory Visit | Attending: Pulmonary Disease | Admitting: Pulmonary Disease

## 2019-09-29 ENCOUNTER — Encounter: Payer: Self-pay | Admitting: *Deleted

## 2019-09-29 ENCOUNTER — Other Ambulatory Visit: Payer: Self-pay

## 2019-09-29 DIAGNOSIS — R911 Solitary pulmonary nodule: Secondary | ICD-10-CM | POA: Insufficient documentation

## 2019-09-29 DIAGNOSIS — N185 Chronic kidney disease, stage 5: Secondary | ICD-10-CM | POA: Diagnosis not present

## 2019-09-29 DIAGNOSIS — Z79899 Other long term (current) drug therapy: Secondary | ICD-10-CM | POA: Diagnosis not present

## 2019-09-29 DIAGNOSIS — I120 Hypertensive chronic kidney disease with stage 5 chronic kidney disease or end stage renal disease: Secondary | ICD-10-CM | POA: Diagnosis not present

## 2019-09-29 DIAGNOSIS — J90 Pleural effusion, not elsewhere classified: Secondary | ICD-10-CM | POA: Insufficient documentation

## 2019-09-29 DIAGNOSIS — R7303 Prediabetes: Secondary | ICD-10-CM | POA: Insufficient documentation

## 2019-09-29 DIAGNOSIS — Z87891 Personal history of nicotine dependence: Secondary | ICD-10-CM | POA: Diagnosis not present

## 2019-09-29 DIAGNOSIS — E785 Hyperlipidemia, unspecified: Secondary | ICD-10-CM | POA: Diagnosis not present

## 2019-09-29 DIAGNOSIS — R918 Other nonspecific abnormal finding of lung field: Secondary | ICD-10-CM | POA: Diagnosis not present

## 2019-09-29 DIAGNOSIS — Z8546 Personal history of malignant neoplasm of prostate: Secondary | ICD-10-CM | POA: Diagnosis not present

## 2019-09-29 DIAGNOSIS — I4891 Unspecified atrial fibrillation: Secondary | ICD-10-CM | POA: Diagnosis not present

## 2019-09-29 DIAGNOSIS — D126 Benign neoplasm of colon, unspecified: Secondary | ICD-10-CM | POA: Diagnosis not present

## 2019-09-29 LAB — GLUCOSE, CAPILLARY: Glucose-Capillary: 93 mg/dL (ref 70–99)

## 2019-09-29 MED ORDER — FLUDEOXYGLUCOSE F - 18 (FDG) INJECTION
7.6600 | Freq: Once | INTRAVENOUS | Status: AC | PRN
  Administered 2019-09-29: 7.66 via INTRAVENOUS

## 2019-09-29 NOTE — Telephone Encounter (Signed)
Oncology Nurse Navigator Documentation  Oncology Nurse Navigator Flowsheets 09/29/2019  Navigator Location [facility]  Referral Date to RadOnc/MedOnc -  Navigator Encounter Type Telephone;Other:  Telephone Outgoing Call/I received a message from Dr. Julien Nordmann on when to schedule [name].  I called patient and updated him on appt for 11/20.  He verbalized understanding of appt time and place.   Treatment Phase Pre-Tx/Tx Discussion  Barriers/Navigation Needs Coordination of Care;Education  Education Other  Interventions Coordination of Care;Education  Acuity Level 2-Minimal Needs (1-2 Barriers Identified)  Coordination of Care Appts;Other  Education Method Verbal  Time Spent with Patient 45

## 2019-09-30 ENCOUNTER — Encounter (HOSPITAL_COMMUNITY): Payer: Self-pay | Admitting: Pulmonary Disease

## 2019-10-01 ENCOUNTER — Telehealth: Payer: Self-pay | Admitting: *Deleted

## 2019-10-01 DIAGNOSIS — I5033 Acute on chronic diastolic (congestive) heart failure: Secondary | ICD-10-CM | POA: Diagnosis not present

## 2019-10-01 DIAGNOSIS — I4891 Unspecified atrial fibrillation: Secondary | ICD-10-CM | POA: Diagnosis not present

## 2019-10-01 DIAGNOSIS — H3530 Unspecified macular degeneration: Secondary | ICD-10-CM | POA: Diagnosis not present

## 2019-10-01 DIAGNOSIS — J449 Chronic obstructive pulmonary disease, unspecified: Secondary | ICD-10-CM | POA: Diagnosis not present

## 2019-10-01 DIAGNOSIS — I132 Hypertensive heart and chronic kidney disease with heart failure and with stage 5 chronic kidney disease, or end stage renal disease: Secondary | ICD-10-CM | POA: Diagnosis not present

## 2019-10-01 DIAGNOSIS — F0390 Unspecified dementia without behavioral disturbance: Secondary | ICD-10-CM | POA: Diagnosis not present

## 2019-10-01 DIAGNOSIS — R079 Chest pain, unspecified: Secondary | ICD-10-CM | POA: Diagnosis not present

## 2019-10-01 DIAGNOSIS — N186 End stage renal disease: Secondary | ICD-10-CM | POA: Diagnosis not present

## 2019-10-01 DIAGNOSIS — G8929 Other chronic pain: Secondary | ICD-10-CM | POA: Diagnosis not present

## 2019-10-01 LAB — CYTOLOGY - NON PAP

## 2019-10-01 NOTE — Telephone Encounter (Signed)
What about the dark stools?

## 2019-10-01 NOTE — Telephone Encounter (Signed)
Verbal order given for PT and INR

## 2019-10-01 NOTE — Telephone Encounter (Signed)
TC from [place] w/ [facility] Today's INR 1.7 PT 20.4 Pt is having black tarry stools has hx of GI bleed

## 2019-10-01 NOTE — Telephone Encounter (Signed)
Description   Goal INR 2.0-3.0 INR today is 1.7  Increase dose to:  4 mg daily  Except on wednesdays, take 6 mg on Wednesday Recheck in 1 week      Caryl Pina, MD [facility] 10/01/2019, 12:21 PM

## 2019-10-01 NOTE — Telephone Encounter (Signed)
Oh yes I missed that one, have him hold for now and come in to check hb poc tomorrow. Caryl Pina, MD [facility] 10/01/2019, 4:53 PM

## 2019-10-02 ENCOUNTER — Other Ambulatory Visit: Payer: Self-pay | Admitting: Family Medicine

## 2019-10-02 DIAGNOSIS — H3530 Unspecified macular degeneration: Secondary | ICD-10-CM | POA: Diagnosis not present

## 2019-10-02 DIAGNOSIS — F0390 Unspecified dementia without behavioral disturbance: Secondary | ICD-10-CM | POA: Diagnosis not present

## 2019-10-02 DIAGNOSIS — I4891 Unspecified atrial fibrillation: Secondary | ICD-10-CM | POA: Diagnosis not present

## 2019-10-02 DIAGNOSIS — I5033 Acute on chronic diastolic (congestive) heart failure: Secondary | ICD-10-CM | POA: Diagnosis not present

## 2019-10-02 DIAGNOSIS — J449 Chronic obstructive pulmonary disease, unspecified: Secondary | ICD-10-CM | POA: Diagnosis not present

## 2019-10-02 DIAGNOSIS — I132 Hypertensive heart and chronic kidney disease with heart failure and with stage 5 chronic kidney disease, or end stage renal disease: Secondary | ICD-10-CM | POA: Diagnosis not present

## 2019-10-02 DIAGNOSIS — N186 End stage renal disease: Secondary | ICD-10-CM | POA: Diagnosis not present

## 2019-10-02 DIAGNOSIS — R079 Chest pain, unspecified: Secondary | ICD-10-CM | POA: Diagnosis not present

## 2019-10-02 DIAGNOSIS — G8929 Other chronic pain: Secondary | ICD-10-CM | POA: Diagnosis not present

## 2019-10-02 NOTE — Telephone Encounter (Signed)
I want him to hold the Coumadin until we get the hemoglobin

## 2019-10-02 NOTE — Telephone Encounter (Signed)
[name] aware and verbalizes understanding.  States he will bring patient in to have his hemoglobin checked today or tomorrow.  Please clarify dose you want patient to take.  [name] states that he was w/o his medication for a few days last week due to a procedure he had done.  Please advise and clarify.

## 2019-10-02 NOTE — Telephone Encounter (Signed)
[name] aware and verbalizes understanding.

## 2019-10-03 ENCOUNTER — Telehealth: Payer: Self-pay

## 2019-10-03 ENCOUNTER — Inpatient Hospital Stay: Payer: Medicare HMO

## 2019-10-03 ENCOUNTER — Inpatient Hospital Stay (HOSPITAL_BASED_OUTPATIENT_CLINIC_OR_DEPARTMENT_OTHER): Payer: Medicare HMO | Admitting: Internal Medicine

## 2019-10-03 ENCOUNTER — Other Ambulatory Visit: Payer: Self-pay | Admitting: Internal Medicine

## 2019-10-03 ENCOUNTER — Encounter: Payer: Self-pay | Admitting: Internal Medicine

## 2019-10-03 ENCOUNTER — Other Ambulatory Visit: Payer: Self-pay | Admitting: *Deleted

## 2019-10-03 ENCOUNTER — Telehealth: Payer: Self-pay | Admitting: Family Medicine

## 2019-10-03 ENCOUNTER — Other Ambulatory Visit: Payer: Self-pay

## 2019-10-03 VITALS — BP 91/45 | HR 107 | Temp 98.7°F | Resp 18 | Ht 69.0 in | Wt 156.8 lb

## 2019-10-03 DIAGNOSIS — Z87891 Personal history of nicotine dependence: Secondary | ICD-10-CM | POA: Diagnosis not present

## 2019-10-03 DIAGNOSIS — I10 Essential (primary) hypertension: Secondary | ICD-10-CM | POA: Diagnosis not present

## 2019-10-03 DIAGNOSIS — R05 Cough: Secondary | ICD-10-CM

## 2019-10-03 DIAGNOSIS — F0390 Unspecified dementia without behavioral disturbance: Secondary | ICD-10-CM | POA: Diagnosis not present

## 2019-10-03 DIAGNOSIS — R0602 Shortness of breath: Secondary | ICD-10-CM | POA: Diagnosis not present

## 2019-10-03 DIAGNOSIS — Z7951 Long term (current) use of inhaled steroids: Secondary | ICD-10-CM | POA: Diagnosis not present

## 2019-10-03 DIAGNOSIS — N186 End stage renal disease: Secondary | ICD-10-CM

## 2019-10-03 DIAGNOSIS — R531 Weakness: Secondary | ICD-10-CM | POA: Diagnosis not present

## 2019-10-03 DIAGNOSIS — R918 Other nonspecific abnormal finding of lung field: Secondary | ICD-10-CM

## 2019-10-03 DIAGNOSIS — Z801 Family history of malignant neoplasm of trachea, bronchus and lung: Secondary | ICD-10-CM | POA: Diagnosis not present

## 2019-10-03 DIAGNOSIS — C3432 Malignant neoplasm of lower lobe, left bronchus or lung: Secondary | ICD-10-CM | POA: Diagnosis not present

## 2019-10-03 DIAGNOSIS — C3492 Malignant neoplasm of unspecified part of left bronchus or lung: Secondary | ICD-10-CM

## 2019-10-03 DIAGNOSIS — Z79899 Other long term (current) drug therapy: Secondary | ICD-10-CM | POA: Diagnosis not present

## 2019-10-03 DIAGNOSIS — Z7189 Other specified counseling: Secondary | ICD-10-CM

## 2019-10-03 DIAGNOSIS — E785 Hyperlipidemia, unspecified: Secondary | ICD-10-CM | POA: Diagnosis not present

## 2019-10-03 DIAGNOSIS — F419 Anxiety disorder, unspecified: Secondary | ICD-10-CM | POA: Diagnosis not present

## 2019-10-03 DIAGNOSIS — Z9079 Acquired absence of other genital organ(s): Secondary | ICD-10-CM | POA: Diagnosis not present

## 2019-10-03 DIAGNOSIS — Z8546 Personal history of malignant neoplasm of prostate: Secondary | ICD-10-CM

## 2019-10-03 DIAGNOSIS — Z5111 Encounter for antineoplastic chemotherapy: Secondary | ICD-10-CM

## 2019-10-03 DIAGNOSIS — Z7901 Long term (current) use of anticoagulants: Secondary | ICD-10-CM

## 2019-10-03 DIAGNOSIS — Z85118 Personal history of other malignant neoplasm of bronchus and lung: Secondary | ICD-10-CM | POA: Diagnosis not present

## 2019-10-03 DIAGNOSIS — J449 Chronic obstructive pulmonary disease, unspecified: Secondary | ICD-10-CM | POA: Diagnosis not present

## 2019-10-03 DIAGNOSIS — Z8249 Family history of ischemic heart disease and other diseases of the circulatory system: Secondary | ICD-10-CM | POA: Diagnosis not present

## 2019-10-03 DIAGNOSIS — Z992 Dependence on renal dialysis: Secondary | ICD-10-CM | POA: Diagnosis not present

## 2019-10-03 DIAGNOSIS — R5383 Other fatigue: Secondary | ICD-10-CM | POA: Diagnosis not present

## 2019-10-03 DIAGNOSIS — E119 Type 2 diabetes mellitus without complications: Secondary | ICD-10-CM | POA: Diagnosis not present

## 2019-10-03 DIAGNOSIS — I4891 Unspecified atrial fibrillation: Secondary | ICD-10-CM | POA: Diagnosis not present

## 2019-10-03 DIAGNOSIS — R710 Precipitous drop in hematocrit: Secondary | ICD-10-CM

## 2019-10-03 LAB — CMP (CANCER CENTER ONLY)
ALT: 17 U/L (ref 0–44)
AST: 17 U/L (ref 15–41)
Albumin: 2.5 g/dL — ABNORMAL LOW (ref 3.5–5.0)
Alkaline Phosphatase: 185 U/L — ABNORMAL HIGH (ref 38–126)
Anion gap: 14 (ref 5–15)
BUN: 34 mg/dL — ABNORMAL HIGH (ref 8–23)
CO2: 30 mmol/L (ref 22–32)
Calcium: 8.7 mg/dL — ABNORMAL LOW (ref 8.9–10.3)
Chloride: 97 mmol/L — ABNORMAL LOW (ref 98–111)
Creatinine: 4.47 mg/dL (ref 0.61–1.24)
GFR, Est AFR Am: 13 mL/min — ABNORMAL LOW (ref >60)
GFR, Estimated: 12 mL/min — ABNORMAL LOW (ref >60)
Glucose, Bld: 136 mg/dL — ABNORMAL HIGH (ref 70–99)
Potassium: 3.7 mmol/L (ref 3.5–5.1)
Sodium: 141 mmol/L (ref 135–145)
Total Bilirubin: 0.5 mg/dL (ref 0.3–1.2)
Total Protein: 6.2 g/dL — ABNORMAL LOW (ref 6.5–8.1)

## 2019-10-03 LAB — CBC WITH DIFFERENTIAL (CANCER CENTER ONLY)
Abs Immature Granulocytes: 0.09 10*3/uL — ABNORMAL HIGH (ref 0.00–0.07)
Basophils Absolute: 0 10*3/uL (ref 0.0–0.1)
Basophils Relative: 0 %
Eosinophils Absolute: 0.1 10*3/uL (ref 0.0–0.5)
Eosinophils Relative: 1 %
HCT: 28.8 % — ABNORMAL LOW (ref 39.0–52.0)
Hemoglobin: 8.8 g/dL — ABNORMAL LOW (ref 13.0–17.0)
Immature Granulocytes: 1 %
Lymphocytes Relative: 12 %
Lymphs Abs: 1.2 10*3/uL (ref 0.7–4.0)
MCH: 31.2 pg (ref 26.0–34.0)
MCHC: 30.6 g/dL (ref 30.0–36.0)
MCV: 102.1 fL — ABNORMAL HIGH (ref 80.0–100.0)
Monocytes Absolute: 0.8 10*3/uL (ref 0.1–1.0)
Monocytes Relative: 8 %
Neutro Abs: 8 10*3/uL — ABNORMAL HIGH (ref 1.7–7.7)
Neutrophils Relative %: 78 %
Platelet Count: 293 10*3/uL (ref 150–400)
RBC: 2.82 MIL/uL — ABNORMAL LOW (ref 4.22–5.81)
RDW: 15.5 % (ref 11.5–15.5)
WBC Count: 10.2 10*3/uL (ref 4.0–10.5)
nRBC: 0 % (ref 0.0–0.2)

## 2019-10-03 MED ORDER — PROCHLORPERAZINE MALEATE 10 MG PO TABS: 10.0000 mg | ORAL_TABLET | Freq: Four times a day (QID) | ORAL | 2 refills | Status: AC | PRN

## 2019-10-03 NOTE — Progress Notes (Signed)
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

## 2019-10-03 NOTE — Progress Notes (Signed)
Critical result - Creatinine 4.47 called to K. Lloyd, RN @ 10:11am with read back.

## 2019-10-03 NOTE — Telephone Encounter (Signed)
Critical Lab:  Creatinine 4.7.

## 2019-10-03 NOTE — Telephone Encounter (Signed)
Patients hb was down slightly at 8.8, continue to hold coumadin over the weekend and watch for black stools. Recheck hb early next week, let me know when stools clear up. Caryl Pina, MD [facility] 10/03/2019, 3:13 PM

## 2019-10-03 NOTE — Progress Notes (Signed)
[place] [phone]   [phone]  CONSULT NOTE  REFERRING PHYSICIAN: Dr. Leory Plowman Icard  REASON FOR CONSULTATION:  78 years old white male recently diagnosed with lung cancer.  HPI Don [name] is a 78 y.o. male with past medical history significant for COPD, atrial fibrillation, anemia, depression, end-stage renal disease and currently on hemodialysis on Tuesday, Thursday and Saturday weekly, dyslipidemia, diabetes mellitus, history of prostate cancer status post surgical resection followed by external beam radiation.  The patient also has a long history for smoking but quit 1 year ago.  He mentioned that he has been complaining of chest pressure for several weeks.  He was seen at the emergency department and chest x-ray on September 10, 2019 showed left lower lobe mass around 7 cm in size.  This was followed by CT scan of the chest on the same day and it showed a 6.8 x 5.2 cm left lower lobe rounded density with hyperdense center consistent with necrotic malignancy or cavitary pneumonia.  There was associated atelectasis and minimal left pleural effusion.  The patient was referred to Dr. Valeta Harms and on September 26, 2019 he underwent video bronchoscopy with electromagnetic navigation bronchoscopy and endobronchial ultrasound and biopsies of the left lower lobe mass.  The final pathology ([id number]) showed malignant cells consistent with squamous cell carcinoma. The patient had a PET scan on September 29, 2019 and that showed 7.0 cm centrally necrotic left lower lobe mass that is hypermetabolic and consistent with primary lung neoplasm.  There was no enlarged or hypermetabolic mediastinal or hilar lymph nodes and no evidence of abdominal/pelvic metastatic disease or osseous metastatic disease.  There was left-sided pleural effusion without pleural nodules or significant FDG uptake. Dr. Valeta Harms kindly referred the patient to me today for evaluation and  recommendation regarding treatment of his condition. When seen today the patient continues to complain of fatigue and weakness as well as shortness of breath with exertion and he is currently on Spiriva and other inhalers.  He also has mild cough with no hemoptysis.  He has no significant weight loss or night sweats.  He has no nausea, vomiting, diarrhea or constipation.  He denied having any headache or visual changes. Family history significant for multiple family members with lung cancer including his brother and sister.  Mother and father had heart disease. The patient is married and has 1 son.  He was accompanied by his [facility] Service today.  He used to work as a Administrator.  He has a history for smoking for around 65 years and quit 1 year ago.  He has no history of alcohol or drug abuse.   HPI  Past Medical History:  Diagnosis Date   Acute bronchitis 04/03/2014   Anemia    low iron   Anxiety    Atrial fibrillation (HCC)    Cataract    COPD (chronic obstructive pulmonary disease) (HCC)    Dementia (HCC)    Depression    Dyspnea    with activity   ESRD (end stage renal disease) ([place]) 04/03/2014   Stage 5 Dialysis on T/Th/Sa   Essential hypertension    Hyperlipidemia    Macular degeneration    Noncompliance with medications 04/2013   Xarelto, digoxin previously   Polyposis coli    Pre-diabetes    Prostate cancer ([place])    Tubular adenoma of colon 07/31/02, 11/18/03    Past Surgical History:  Procedure Laterality Date   A/V FISTULAGRAM Left 05/30/2019  Procedure: A/V FISTULAGRAM;  Surgeon: Elam Dutch, MD;  Location: [facility];  Service: Cardiovascular;  Laterality: Left;   Anal abcess,Hemorroids,     AV FISTULA PLACEMENT Left 06/23/2019   Procedure: INSERTION OF ARTERIOVENOUS (AV) GORE-TEX GRAFT ARM;  Surgeon: Elam Dutch, MD;  Location: [facility];  Service: Vascular;  Laterality: Left;   [place] Left 10/08/2018    Procedure: FIRST STAGE BASILIC VEIN TRANSPOSITION LEFT ARM;  Surgeon: Angelia Mould, MD;  Location: [facility];  Service: Vascular;  Laterality: Left;   [place] Left 12/16/2018   Procedure: BASILIC VEIN TRANSPOSITION SECOND STAGE;  Surgeon: Angelia Mould, MD;  Location: [facility];  Service: Vascular;  Laterality: Left;   COLONOSCOPY N/A 04/05/2014   Dr. Fuller Plan: 6 mm sessile polyp from sigmoid colon (tubular adenoma), internal hemorrhoids   COLONOSCOPY W/ BIOPSIES  11/18/2003   Dr. Earle Gell   ESOPHAGOGASTRODUODENOSCOPY  11/18/2003   Dr. Earle Gell   ESOPHAGOGASTRODUODENOSCOPY N/A 04/05/2014   Dr. Fuller Plan: variable Z line, negative Barrett's, small hiatal hernia   ESOPHAGOGASTRODUODENOSCOPY N/A 02/02/2017   Dr. Oneida Alar: LA Grade B esophagitis, one moderate benign-appearing intrinsic stenosis traversed, small non-bleeding diverticulum in second portion of duodenum, reactive gastropathy, no H.pylori.   ESOPHAGOGASTRODUODENOSCOPY N/A 03/22/2018   Procedure: ESOPHAGOGASTRODUODENOSCOPY (EGD);  Surgeon: Daneil Dolin, MD;  [facility];  Service: Endoscopy;  Laterality: N/A;   GIVENS CAPSULE STUDY N/A 03/22/2018   Procedure: GIVENS CAPSULE STUDY;  Surgeon: Daneil Dolin, MD;  [facility];  Service: Endoscopy;  Laterality: N/A;   IR FLUORO GUIDE CV LINE RIGHT  10/29/2018   IR REMOVAL TUN CV CATH W/O FL  08/06/2019   IR US GUIDE VASC ACCESS RIGHT  10/29/2018   PERIPHERAL VASCULAR BALLOON ANGIOPLASTY Left 03/14/2019   Procedure: PERIPHERAL VASCULAR BALLOON ANGIOPLASTY;  Surgeon: Angelia Mould, MD;  Location: [facility];  Service: Cardiovascular;  Laterality: Left;   PERIPHERAL VASCULAR BALLOON ANGIOPLASTY  05/30/2019   Procedure: PERIPHERAL VASCULAR BALLOON ANGIOPLASTY;  Surgeon: Elam Dutch, MD;  Location: [facility];  Service: Cardiovascular;;  left arm fistula   RETROPUBIC PROSTATECTOMY  11/26/2001    TONSILLECTOMY     VIDEO BRONCHOSCOPY WITH ENDOBRONCHIAL NAVIGATION N/A 09/26/2019   Procedure: VIDEO BRONCHOSCOPY WITH ENDOBRONCHIAL NAVIGATION;  Surgeon: Garner Nash, DO;  Location: [facility];  Service: Thoracic;  Laterality: N/A;   VIDEO BRONCHOSCOPY WITH ENDOBRONCHIAL ULTRASOUND N/A 09/26/2019   Procedure: VIDEO BRONCHOSCOPY WITH ENDOBRONCHIAL ULTRASOUND;  Surgeon: Garner Nash, DO;  Location: [facility];  Service: Thoracic;  Laterality: N/A;    Family History  Problem Relation Age of Onset   Diabetes Father    Heart disease Father 44       MI   Heart attack Father    Heart attack Mother    Heart disease Brother    Cancer Brother        lung   Lung cancer Brother    Heart disease Brother    Cancer Brother        possibly riddled with cancer   Lung cancer Sister    Cancer Sister        lung   Heart disease Brother    Lung cancer Brother    Other Brother 77       accident   Heart disease Brother    Lung cancer Nephew     Social History Social History   Tobacco Use  Smoking status: Former Smoker    Packs/day: 0.50    Years: 62.00    Pack years: 31.00    Types: Cigarettes, Cigars    Quit date: 10/27/2018    Years since quitting: 0.9   Smokeless tobacco: Never Used   Tobacco comment: "very little"  Substance Use Topics   Alcohol use: No   Drug use: No    Allergies  Allergen Reactions   Wellbutrin [Bupropion] Anxiety    Current Outpatient Medications  Medication Sig Dispense Refill   acetaminophen (TYLENOL) 325 MG tablet Take 325-650 mg by mouth every 6 (six) hours as needed for moderate pain.      albuterol (PROAIR HFA) 108 (90 Base) MCG/ACT inhaler Inhale 2 puffs into the lungs 2 (two) times daily.      atorvastatin (LIPITOR) 20 MG tablet Take 1 tablet (20 mg total) by mouth daily. (Needs to be seen) (Patient taking differently: Take 20 mg by mouth at bedtime. ) 90 tablet 3   bisoprolol (ZEBETA) 10 MG tablet TAKE (1) TABLET TWICE  A DAY. (Patient taking differently: Take 10 mg by mouth every morning. ) 180 tablet 1   budesonide-formoterol (SYMBICORT) 160-4.5 MCG/ACT inhaler Inhale 2 puffs into the lungs 2 (two) times daily.     calcitRIOL (ROCALTROL) 0.25 MCG capsule Take 1 capsule (0.25 mcg total) by mouth every morning. 90 capsule 3   clonazePAM (KLONOPIN) 1 MG tablet TAKE 1/2 TABLET AT BEDTIME (Patient taking differently: Take 0.5 mg by mouth at bedtime. ) 15 tablet 1   cyclobenzaprine (FLEXERIL) 10 MG tablet Take 1 tablet (10 mg total) by mouth 3 (three) times daily as needed for muscle spasms. 30 tablet 0   diltiazem (CARDIZEM CD) 120 MG 24 hr capsule Take 1 capsule (120 mg total) by mouth daily. 90 capsule 3   ferrous sulfate 325 (65 FE) MG tablet Take 325 mg by mouth every Monday, Wednesday, and Friday.     fluticasone (FLONASE) 50 MCG/ACT nasal spray Place 2 sprays into both nostrils daily. (Patient taking differently: Place 2 sprays into both nostrils daily as needed for allergies. ) 16 g 6   hydroxypropyl methylcellulose / hypromellose (ISOPTO TEARS / GONIOVISC) 2.5 % ophthalmic solution Place 1 drop into both eyes 2 (two) times daily as needed for dry eyes.      iron polysaccharides (FERREX 150) 150 MG capsule Take 1 capsule (150 mg total) by mouth daily. 90 capsule 0   lidocaine-prilocaine (EMLA) cream Apply 1 application topically as needed. 30 g 0   loratadine (CLARITIN) 10 MG tablet TAKE 1 TABLET DAILY 30 tablet 11   Melatonin 3 MG TABS Take 3 mg by mouth at bedtime.      Multiple Vitamin (MULTIVITAMIN WITH MINERALS) TABS tablet Take 1 tablet by mouth at bedtime.      Multiple Vitamins-Minerals (MENS ONE DAILY PO) Take 1 tablet by mouth daily.     Multiple Vitamins-Minerals (PRESERVISION AREDS 2 PO) Take 1 tablet by mouth 2 (two) times daily.      Multiple Vitamins-Minerals (PRESERVISION AREDS 2+MULTI VIT) CAPS Take 1 capsule by mouth 2 (two) times daily.     multivitamin (RENA-VIT) TABS tablet  Take 1 tablet by mouth at bedtime. 30 tablet 0   Nutritional Supplements (FEEDING SUPPLEMENT, NEPRO CARB STEADY,) LIQD Take 237 mLs by mouth 2 (two) times daily between meals. 60 Can 0   omega-3 acid ethyl esters (LOVAZA) 1 g capsule Take 2 g by mouth 2 (two) times daily.  Omega-3 Fatty Acids (FISH OIL) 1000 MG CAPS Take 2 capsules by mouth 2 (two) times daily.     pantoprazole (PROTONIX) 40 MG tablet TAKE (1) TABLET TWICE A DAY. 180 tablet 0   sevelamer carbonate (RENVELA) 800 MG tablet Take 800 mg by mouth 3 (three) times daily with meals.     terbinafine (LAMISIL) 250 MG tablet Take 3 times weekly as directed on dialysis day after dialysis 36 tablet 0   tiotropium (SPIRIVA) 18 MCG inhalation capsule Place 18 mcg into inhaler and inhale daily.      warfarin (COUMADIN) 2 MG tablet TAKE 2 TABLETS (4MG ) DAILY EXCEPT ON MONDAY AND THURS TAKE 2 & 1/2 (5MG ) (Patient taking differently: Take 4 mg by mouth daily. ) 70 tablet 0   No current facility-administered medications for this visit.     Review of Systems  Constitutional: positive for fatigue Eyes: negative Ears, nose, mouth, throat, and face: negative Respiratory: positive for cough and dyspnea on exertion Cardiovascular: negative Gastrointestinal: negative Genitourinary:negative Integument/breast: negative Hematologic/lymphatic: negative Musculoskeletal:positive for muscle weakness Neurological: negative Behavioral/Psych: negative Endocrine: negative Allergic/Immunologic: negative  Physical Exam  YIR:SWNIO, healthy, no distress, well nourished and well developed SKIN: skin color, texture, turgor are normal, no rashes or significant lesions HEAD: Normocephalic, No masses, lesions, tenderness or abnormalities EYES: normal, PERRLA, Conjunctiva are pink and non-injected EARS: External ears normal, Canals clear OROPHARYNX:no exudate, no erythema and lips, buccal mucosa, and tongue normal  NECK: supple, no adenopathy, no  JVD LYMPH:  no palpable lymphadenopathy, no hepatosplenomegaly LUNGS: clear to auscultation , and palpation HEART: regular rate & rhythm, no murmurs and no gallops ABDOMEN:abdomen soft, non-tender, normal bowel sounds and no masses or organomegaly BACK: No CVA tenderness, Range of motion is normal EXTREMITIES:no joint deformities, effusion, or inflammation, no edema  NEURO: alert & oriented x 3 with fluent speech, no focal motor/sensory deficits  PERFORMANCE STATUS: ECOG 1  LABORATORY DATA: Lab Results  Component Value Date   WBC 10.2 10/03/2019   HGB 8.8 (L) 10/03/2019   HCT 28.8 (L) 10/03/2019   MCV 102.1 (H) 10/03/2019   PLT 293 10/03/2019      Chemistry      Component Value Date/Time   NA 138 09/26/2019 0626   NA 143 02/27/2019 1120   K 3.9 09/26/2019 0626   CL 96 (L) 09/26/2019 0626   CO2 27 09/26/2019 0626   BUN 25 (H) 09/26/2019 0626   BUN 59 (H) 02/27/2019 1120   CREATININE 4.22 (H) 09/26/2019 0626   CREATININE 1.41 (H) 05/15/2013 1150      Component Value Date/Time   CALCIUM 8.7 (L) 09/26/2019 0626   ALKPHOS 118 09/26/2019 0626   AST 15 09/26/2019 0626   ALT 12 09/26/2019 0626   BILITOT 0.6 09/26/2019 0626   BILITOT 0.3 02/27/2019 1120       RADIOGRAPHIC STUDIES: Dg Chest 2 View  Result Date: 09/10/2019 CLINICAL DATA:  Involuntary commitment, medical clearance; history atrial fibrillation, COPD, hypertension, prostate cancer EXAM: CHEST - 2 VIEW COMPARISON:  02/26/2019 FINDINGS: Normal heart size, mediastinal contours, and pulmonary vascularity. Atherosclerotic calcification aorta. Emphysematous and minimal bronchitic changes consistent with COPD. Masslike opacity identified in LEFT lower lobe 6.5 x 5.3 x 7.0 cm concerning for pulmonary neoplasm, round pneumonia considered less likely. Associated infiltrate and/or atelectasis at LEFT base. Blunting of the LEFT posterior costophrenic angle by tiny pleural effusion. No acute osseous findings. IMPRESSION: COPD  changes with mild atelectasis versus infiltrate and tiny effusion at LEFT base. In  addition, 7 cm diameter masslike opacity in LEFT lower lobe suspicious for pulmonary neoplasm; CT chest with contrast recommended for further assessment. Findings called to Dr. Eulis Foster on 07/11/2019 at 1009 hours. Electronically Signed   By: Lavonia Dana M.D.   On: 09/10/2019 10:14   Ct Chest W Contrast  Result Date: 09/10/2019 CLINICAL DATA:  Chest pain, lung nodule. EXAM: CT CHEST WITH CONTRAST TECHNIQUE: Multidetector CT imaging of the chest was performed during intravenous contrast administration. CONTRAST:  46mL OMNIPAQUE IOHEXOL 300 MG/ML  SOLN COMPARISON:  Mar 20, 2014. FINDINGS: Cardiovascular: Atherosclerosis of thoracic aorta is noted without aneurysm or dissection. Normal cardiac size. No pericardial effusion. Mediastinum/Nodes: No enlarged mediastinal, hilar, or axillary lymph nodes. Thyroid gland, trachea, and esophagus demonstrate no significant findings. Lungs/Pleura: No pneumothorax is noted. Minimal right posterior basilar subsegmental atelectasis or scarring is noted. 6.8 x 5.2 cm left lower lobe rounded density is noted with hypodense center consistent with necrotic malignancy or cavitary pneumonia. There is associated atelectasis. Minimal left pleural effusion is noted. Upper Abdomen: Cholelithiasis is noted. Musculoskeletal: No chest wall abnormality. No acute or significant osseous findings. IMPRESSION: 6.8 x 5.2 cm left lower lobe rounded density is noted with hypodense center concerning for necrotic malignancy or less likely cavitary pneumonia. Bronchoscopy is recommended. There is mild surrounding atelectasis with minimal left pleural effusion. Aortic Atherosclerosis (ICD10-I70.0). Electronically Signed   By: Marijo Conception M.D.   On: 09/10/2019 13:19   Nm Pet - Initial Skull Base To Thigh  Result Date: 09/29/2019 CLINICAL DATA:  Initial treatment strategy for left lung mass. EXAM: NUCLEAR MEDICINE PET  SKULL BASE TO THIGH TECHNIQUE: 7.66 mCi F-18 FDG was injected intravenously. Full-ring PET imaging was performed from the skull base to thigh after the radiotracer. CT data was obtained and used for attenuation correction and anatomic localization. Fasting blood glucose: 93 mg/dl COMPARISON:  Chest CT 09/10/2019 FINDINGS: Mediastinal blood pool activity: SUV max 2.97 Liver activity: SUV max NA NECK: No hypermetabolic lymph nodes in the neck. Incidental CT findings: None. CHEST: 7 cm necrotic left lower lobe lung mass is markedly hypermetabolic with SUV max of 16.10. Central necrosis is noted. There is a small left-sided adjacent pleural effusion but I do not see any hypermetabolism. Small scattered mediastinal and hilar lymph nodes are stable. No enlarged or hypermetabolic mediastinal or hilar lymph nodes. No other pulmonary nodules are identified. Mild underlying emphysematous changes are noted. Incidental CT findings: Aortic and coronary artery calcifications. ABDOMEN/PELVIS: There are bilateral low-attenuation lesion adrenal glands consistent with benign adenomas. No abnormal FDG uptake. No evidence of hepatic metastatic disease. No abdominal/pelvic lymphadenopathy the. Incidental CT findings: Cholelithiasis. Moderate to advanced atherosclerotic calcifications involving the aorta and iliac arteries but no aneurysm. SKELETON: No suspicious bone lesions. Incidental CT findings: none IMPRESSION: 1. 7 cm centrally necrotic left lower lobe mass is hypermetabolic and consistent with primary lung neoplasm. 2. No enlarged or hypermetabolic mediastinal or hilar lymph nodes and no evidence of abdominal/pelvic metastatic disease or osseous metastatic disease. 3. Left-sided pleural effusion without pleural nodules or significant FDG uptake. Electronically Signed   By: Marijo Sanes M.D.   On: 09/29/2019 14:59   Dg Chest Port 1 View  Result Date: 09/26/2019 CLINICAL DATA:  Postop bronchoscopy EXAM: PORTABLE CHEST 1 VIEW  COMPARISON:  09/10/2019 FINDINGS: Persistent left lower lobe mass. Left basilar airspace disease likely reflecting pneumonitis and atelectasis. Small left pleural effusion. Right lung is clear. No pneumothorax. Stable cardiomediastinal silhouette. IMPRESSION: 1. Persistent left lower lobe  mass. Left basilar airspace disease likely reflecting pneumonitis and atelectasis. Small left pleural effusion. Electronically Signed   By: Kathreen Devoid   On: 09/26/2019 09:53   Dg C-arm Bronchoscopy  Result Date: 09/26/2019 C-ARM BRONCHOSCOPY: Fluoroscopy was utilized by the requesting physician.  No radiographic interpretation.    ASSESSMENT: This is a very pleasant 78 years old white male recently diagnosed with a stage IIb (T3, N0, M0) non-small cell lung cancer, squamous cell carcinoma diagnosed in November 2020 and presented with large cavitary left lower lobe lung mass with no significant hilar or mediastinal lymphadenopathy and no distant metastatic disease.   PLAN: I had a lengthy discussion with the patient and his stepson today about his current disease stage, prognosis and treatment options. I recommended for the patient to complete the staging work-up by ordering MRI of the brain to rule out brain metastasis. I also discussed with the patient his treatment options including surgical resection which I doubt he will be a good surgical candidate because of his other comorbidities. I will discuss with Dr. Roxan Hockey his condition to see if the patient would be a good surgical candidate but unlikely. I discussed with him other option for treatment of his condition and recommended for him a treatment with a course of concurrent chemoradiation with weekly carboplatin for AUC of 2 and paclitaxel 45 mg/M2 for around 6-7 weeks. I discussed with the patient the adverse effect of this treatment including but not limited to alopecia, myelosuppression, nausea and vomiting, peripheral neuropathy, liver or renal  dysfunction. I will refer the patient to radiation oncology for discussion of the radiotherapy option. We will call his pharmacy with prescription for Compazine 10 mg p.o. every 6 hours as needed for nausea. I will arrange for the patient to have a chemotherapy education class before the first dose of his treatment. He is expected to start the first dose of this treatment on October 13, 2019 and he will come back for follow-up visit 1 week after his treatment for evaluation and management of any adverse effect of his treatment. The patient and his stepson agreed to the current plan. For the end-stage renal disease he will continue his current hemodialysis on Tuesday, Thursday and Saturday weekly by his nephrology team. He was advised to call immediately if he has any concerning symptoms in the interval.  The patient voices understanding of current disease status and treatment options and is in agreement with the current care plan.  All questions were answered. The patient knows to call the clinic with any problems, questions or concerns. We can certainly see the patient much sooner if necessary.  Thank you so much for allowing me to participate in the care of Don [name]. I will continue to follow up the patient with you and assist in his care.  I spent 55 minutes counseling the patient face to face. The total time spent in the appointment was 80 minutes.  Disclaimer: This note was dictated with voice recognition software. Similar sounding words can inadvertently be transcribed and may not be corrected upon review.   Eilleen Kempf October 03, 2019, 8:51 AM

## 2019-10-03 NOTE — Patient Instructions (Signed)
Steps to Quit Smoking Smoking tobacco is the leading cause of preventable death. It can affect almost every organ in the body. Smoking puts you and people around you at risk for many serious, long-lasting (chronic) diseases. Quitting smoking can be hard, but it is one of the best things that you can do for your health. It is never too late to quit. How do I get ready to quit? When you decide to quit smoking, make a plan to help you succeed. Before you quit:  Pick a date to quit. Set a date within the next 2 weeks to give you time to prepare.  Write down the reasons why you are quitting. Keep this list in places where you will see it often.  Tell your family, friends, and co-workers that you are quitting. Their support is important.  Talk with your doctor about the choices that may help you quit.  Find out if your health insurance will pay for these treatments.  Know the people, places, things, and activities that make you want to smoke (triggers). Avoid them. What first steps can I take to quit smoking?  Throw away all cigarettes at home, at work, and in your car.  Throw away the things that you use when you smoke, such as ashtrays and lighters.  Clean your car. Make sure to empty the ashtray.  Clean your home, including curtains and carpets. What can I do to help me quit smoking? Talk with your doctor about taking medicines and seeing a counselor at the same time. You are more likely to succeed when you do both.  If you are pregnant or breastfeeding, talk with your doctor about counseling or other ways to quit smoking. Do not take medicine to help you quit smoking unless your doctor tells you to do so. To quit smoking: Quit right away  Quit smoking totally, instead of slowly cutting back on how much you smoke over a period of time.  Go to counseling. You are more likely to quit if you go to counseling sessions regularly. Take medicine You may take medicines to help you quit. Some  medicines need a prescription, and some you can buy over-the-counter. Some medicines may contain a drug called nicotine to replace the nicotine in cigarettes. Medicines may:  Help you to stop having the desire to smoke (cravings).  Help to stop the problems that come when you stop smoking (withdrawal symptoms). Your doctor may ask you to use:  Nicotine patches, gum, or lozenges.  Nicotine inhalers or sprays.  Non-nicotine medicine that is taken by mouth. Find resources Find resources and other ways to help you quit smoking and remain smoke-free after you quit. These resources are most helpful when you use them often. They include:  Online chats with a counselor.  Phone quitlines.  Printed self-help materials.  Support groups or group counseling.  Text messaging programs.  Mobile phone apps. Use apps on your mobile phone or tablet that can help you stick to your quit plan. There are many free apps for mobile phones and tablets as well as websites. Examples include Quit Guide from the CDC and smokefree.gov  What things can I do to make it easier to quit?   Talk to your family and friends. Ask them to support and encourage you.  Call a phone quitline (1-800-QUIT-NOW), reach out to support groups, or work with a counselor.  Ask people who smoke to not smoke around you.  Avoid places that make you want to smoke,   such as: ? Bars. ? Parties. ? Smoke-break areas at work.  Spend time with people who do not smoke.  Lower the stress in your life. Stress can make you want to smoke. Try these things to help your stress: ? Getting regular exercise. ? Doing deep-breathing exercises. ? Doing yoga. ? Meditating. ? Doing a body scan. To do this, close your eyes, focus on one area of your body at a time from head to toe. Notice which parts of your body are tense. Try to relax the muscles in those areas. How will I feel when I quit smoking? Day 1 to 3 weeks Within the first 24 hours,  you may start to have some problems that come from quitting tobacco. These problems are very bad 2-3 days after you quit, but they do not often last for more than 2-3 weeks. You may get these symptoms:  Mood swings.  Feeling restless, nervous, angry, or annoyed.  Trouble concentrating.  Dizziness.  Strong desire for high-sugar foods and nicotine.  Weight gain.  Trouble pooping (constipation).  Feeling like you may vomit (nausea).  Coughing or a sore throat.  Changes in how the medicines that you take for other issues work in your body.  Depression.  Trouble sleeping (insomnia). Week 3 and afterward After the first 2-3 weeks of quitting, you may start to notice more positive results, such as:  Better sense of smell and taste.  Less coughing and sore throat.  Slower heart rate.  Lower blood pressure.  Clearer skin.  Better breathing.  Fewer sick days. Quitting smoking can be hard. Do not give up if you fail the first time. Some people need to try a few times before they succeed. Do your best to stick to your quit plan, and talk with your doctor if you have any questions or concerns. Summary  Smoking tobacco is the leading cause of preventable death. Quitting smoking can be hard, but it is one of the best things that you can do for your health.  When you decide to quit smoking, make a plan to help you succeed.  Quit smoking right away, not slowly over a period of time.  When you start quitting, seek help from your doctor, family, or friends. This information is not intended to replace advice given to you by your health care provider. Make sure you discuss any questions you have with your health care provider. Document Released: 08/26/2009 Document Revised: 01/17/2019 Document Reviewed: 01/18/2019 Elsevier Patient Education  2020 Elsevier Inc.  

## 2019-10-03 NOTE — Telephone Encounter (Signed)
OK. He is on hemodialysis.

## 2019-10-03 NOTE — Telephone Encounter (Signed)
Pt son aware  - will bring him in MON or Tuesday.  Order placed

## 2019-10-06 ENCOUNTER — Telehealth: Payer: Self-pay | Admitting: Internal Medicine

## 2019-10-06 NOTE — Telephone Encounter (Signed)
Scheduled per los. Called and spoke with patient. Confirmed appt 

## 2019-10-07 ENCOUNTER — Other Ambulatory Visit: Payer: Medicare HMO

## 2019-10-07 ENCOUNTER — Other Ambulatory Visit: Payer: Self-pay

## 2019-10-07 DIAGNOSIS — R710 Precipitous drop in hematocrit: Secondary | ICD-10-CM | POA: Diagnosis not present

## 2019-10-08 ENCOUNTER — Inpatient Hospital Stay: Payer: Medicare HMO

## 2019-10-08 ENCOUNTER — Other Ambulatory Visit: Payer: Self-pay | Admitting: Family Medicine

## 2019-10-08 ENCOUNTER — Ambulatory Visit (INDEPENDENT_AMBULATORY_CARE_PROVIDER_SITE_OTHER): Payer: Medicare HMO

## 2019-10-08 DIAGNOSIS — N186 End stage renal disease: Secondary | ICD-10-CM

## 2019-10-08 DIAGNOSIS — G8929 Other chronic pain: Secondary | ICD-10-CM

## 2019-10-08 DIAGNOSIS — F0390 Unspecified dementia without behavioral disturbance: Secondary | ICD-10-CM | POA: Diagnosis not present

## 2019-10-08 DIAGNOSIS — I4891 Unspecified atrial fibrillation: Secondary | ICD-10-CM

## 2019-10-08 DIAGNOSIS — Z7901 Long term (current) use of anticoagulants: Secondary | ICD-10-CM

## 2019-10-08 DIAGNOSIS — I132 Hypertensive heart and chronic kidney disease with heart failure and with stage 5 chronic kidney disease, or end stage renal disease: Secondary | ICD-10-CM | POA: Diagnosis not present

## 2019-10-08 DIAGNOSIS — Z992 Dependence on renal dialysis: Secondary | ICD-10-CM

## 2019-10-08 DIAGNOSIS — E785 Hyperlipidemia, unspecified: Secondary | ICD-10-CM

## 2019-10-08 DIAGNOSIS — F339 Major depressive disorder, recurrent, unspecified: Secondary | ICD-10-CM

## 2019-10-08 DIAGNOSIS — I5033 Acute on chronic diastolic (congestive) heart failure: Secondary | ICD-10-CM | POA: Diagnosis not present

## 2019-10-08 DIAGNOSIS — H3530 Unspecified macular degeneration: Secondary | ICD-10-CM

## 2019-10-08 DIAGNOSIS — R079 Chest pain, unspecified: Secondary | ICD-10-CM | POA: Diagnosis not present

## 2019-10-08 DIAGNOSIS — J449 Chronic obstructive pulmonary disease, unspecified: Secondary | ICD-10-CM | POA: Diagnosis not present

## 2019-10-08 DIAGNOSIS — Z87891 Personal history of nicotine dependence: Secondary | ICD-10-CM

## 2019-10-08 DIAGNOSIS — F419 Anxiety disorder, unspecified: Secondary | ICD-10-CM

## 2019-10-08 DIAGNOSIS — Z86010 Personal history of colonic polyps: Secondary | ICD-10-CM

## 2019-10-08 LAB — CBC WITH DIFFERENTIAL/PLATELET
Basophils Absolute: 0.1 10*3/uL (ref 0.0–0.2)
Basos: 1 %
EOS (ABSOLUTE): 0.2 10*3/uL (ref 0.0–0.4)
Eos: 2 %
Hematocrit: 26.6 % — ABNORMAL LOW (ref 37.5–51.0)
Hemoglobin: 8.4 g/dL — CL (ref 13.0–17.7)
Immature Grans (Abs): 0.1 10*3/uL (ref 0.0–0.1)
Immature Granulocytes: 1 %
Lymphocytes Absolute: 1.6 10*3/uL (ref 0.7–3.1)
Lymphs: 16 %
MCH: 31.1 pg (ref 26.6–33.0)
MCHC: 31.6 g/dL (ref 31.5–35.7)
MCV: 99 fL — ABNORMAL HIGH (ref 79–97)
Monocytes Absolute: 0.7 10*3/uL (ref 0.1–0.9)
Monocytes: 8 %
Neutrophils Absolute: 7.2 10*3/uL — ABNORMAL HIGH (ref 1.4–7.0)
Neutrophils: 72 %
Platelets: 327 10*3/uL (ref 150–450)
RBC: 2.7 x10E6/uL — CL (ref 4.14–5.80)
RDW: 14.5 % (ref 11.6–15.4)
WBC: 9.9 10*3/uL (ref 3.4–10.8)

## 2019-10-08 NOTE — Progress Notes (Unsigned)
Patient would like to have Radiation treatments locally at [facility].  Chelsea (Tourist information centre manager) at the [place] will need to be contacted for approval for radiation treatments, her number is [phone].  Information given to Dr Ida Rogue nurse.

## 2019-10-13 ENCOUNTER — Telehealth: Payer: Self-pay | Admitting: Family Medicine

## 2019-10-13 ENCOUNTER — Ambulatory Visit: Payer: Medicare HMO

## 2019-10-13 ENCOUNTER — Other Ambulatory Visit: Payer: Self-pay

## 2019-10-13 ENCOUNTER — Telehealth: Payer: Self-pay | Admitting: Radiation Oncology

## 2019-10-13 ENCOUNTER — Other Ambulatory Visit: Payer: Medicare HMO

## 2019-10-13 ENCOUNTER — Institutional Professional Consult (permissible substitution) (INDEPENDENT_AMBULATORY_CARE_PROVIDER_SITE_OTHER): Payer: Medicare HMO | Admitting: Thoracic Surgery (Cardiothoracic Vascular Surgery)

## 2019-10-13 ENCOUNTER — Other Ambulatory Visit: Payer: Self-pay | Admitting: *Deleted

## 2019-10-13 VITALS — BP 108/60 | HR 100 | Temp 97.6°F | Resp 20 | Ht 69.0 in | Wt 157.0 lb

## 2019-10-13 DIAGNOSIS — R710 Precipitous drop in hematocrit: Secondary | ICD-10-CM

## 2019-10-13 DIAGNOSIS — C3492 Malignant neoplasm of unspecified part of left bronchus or lung: Secondary | ICD-10-CM

## 2019-10-13 DIAGNOSIS — I4891 Unspecified atrial fibrillation: Secondary | ICD-10-CM

## 2019-10-13 DIAGNOSIS — D649 Anemia, unspecified: Secondary | ICD-10-CM

## 2019-10-13 LAB — COAGUCHEK XS/INR WAIVED
INR: 1.2 — ABNORMAL HIGH (ref 0.9–1.1)
Prothrombin Time: 14.3 s

## 2019-10-13 NOTE — Telephone Encounter (Signed)
Patient was just here to get labs done. Knows what labs he was having.

## 2019-10-13 NOTE — Telephone Encounter (Signed)
I spoke with [name], the patient's HCPOA. We discussed that [name] is a [facility] patient and needs a community referral to be seen in a different health system. I will touch base with our authorization team to see if they can reach out to [facility] [phone] to request this. Since the patient desires treatment for radiation closer to home, we can make a referral to [facility] for XRT particularly since he is likely a chemoRT candidate rather than a surgical candidate and Dr. Julien Nordmann would like therapy to start next Monday. [name] is satisfied and we will cancel tomorrow's appointment with Dr. Lisbeth Renshaw.    Carola Rhine, PAC

## 2019-10-13 NOTE — Telephone Encounter (Signed)
-----   Message from Shelbie Ammons, LPN sent at [id number] 11:02 AM EST ----- Aware of all lab results.   Son wanted an INR done.  An inr and cbc were ordered and patient will be in today for blood work.  If any others labs are needed , please add.

## 2019-10-13 NOTE — Progress Notes (Signed)
PCP is Dettinger, Fransisca Kaufmann, MD Referring Provider is Curt Bears, MD  Chief Complaint  Patient presents with  . Lung Cancer    Surgical eval, PET Scan 09/29/19,Chest CT 09/10/19, PFT's 09/22/19    HPI: [name] is sent for consultation regarding a left lower lobe squamous cell carcinoma.  Don [name] is an 78 year old man with a past medical history significant for hypertension, hyperlipidemia, end-stage renal disease on hemodialysis, chronic atrial fibrillation, iron deficiency anemia, prostate cancer, depression, and mild dementia.  He recently was found to have a 7 cm left lower lobe lung mass.  Dr. Valeta Harms did bronchoscopy and biopsies were positive for squamous cell carcinoma.  He was referred to Dr. Julien Nordmann and radiation oncology for consideration for treatment.  Dr. Julien Nordmann has referred him for consideration for surgical resection.  [name] lives alone but has help from a friend/neighbor and his son for groceries and general upkeep.  He has been on dialysis for about a year.  He smoked roughly a pack a day for 66 years prior to quitting a year ago.  He says that he cannot walk very far without "giving out."  He is not able to walk up a flight of stairs without stopping.  He has noted black tarry stools, but no bright red blood.  Zubrod Score: At the time of surgery this patient's most appropriate activity status/level should be described as: []     0    Normal activity, no symptoms []     1    Restricted in physical strenuous activity but ambulatory, able to do out light work [x]     2    Ambulatory and capable of self care, unable to do work activities, up and about >50 % of waking hours                              []     3    Only limited self care, in bed greater than 50% of waking hours []     4    Completely disabled, no self care, confined to bed or chair []     5    Moribund  Past Medical History:  Diagnosis Date  . Acute bronchitis 04/03/2014  . Anemia    low iron  .  Anxiety   . Atrial fibrillation ([place])   . Cataract   . COPD (chronic obstructive pulmonary disease) ([place])   . Dementia ([place])   . Depression   . Dyspnea    with activity  . ESRD (end stage renal disease) ([place]) 04/03/2014   Stage 5 Dialysis on T/Th/Sa  . Essential hypertension   . Hyperlipidemia   . Macular degeneration   . Noncompliance with medications 04/2013   Xarelto, digoxin previously  . Polyposis coli   . Pre-diabetes   . Prostate cancer ([place])   . Tubular adenoma of colon 07/31/02, 11/18/03    Past Surgical History:  Procedure Laterality Date  . A/V FISTULAGRAM Left 05/30/2019   Procedure: A/V FISTULAGRAM;  Surgeon: Elam Dutch, MD;  Location: [facility];  Service: Cardiovascular;  Laterality: Left;  . Anal abcess,Hemorroids,    . AV FISTULA PLACEMENT Left 06/23/2019   Procedure: INSERTION OF ARTERIOVENOUS (AV) GORE-TEX GRAFT ARM;  Surgeon: Elam Dutch, MD;  Location: [facility];  Service: Vascular;  Laterality: Left;  . BASCILIC VEIN TRANSPOSITION Left 10/08/2018   Procedure: FIRST STAGE BASILIC VEIN TRANSPOSITION LEFT ARM;  Surgeon: Scot Dock,  Judeth Cornfield, MD;  Location: [facility];  Service: Vascular;  Laterality: Left;  . BASCILIC VEIN TRANSPOSITION Left 12/16/2018   Procedure: BASILIC VEIN TRANSPOSITION SECOND STAGE;  Surgeon: Angelia Mould, MD;  Location: [facility] OR;  Service: Vascular;  Laterality: Left;  . COLONOSCOPY N/A 04/05/2014   Dr. Fuller Plan: 6 mm sessile polyp from sigmoid colon (tubular adenoma), internal hemorrhoids  . COLONOSCOPY W/ BIOPSIES  11/18/2003   Dr. Earle Gell  . ESOPHAGOGASTRODUODENOSCOPY  11/18/2003   Dr. Earle Gell  . ESOPHAGOGASTRODUODENOSCOPY N/A 04/05/2014   Dr. Fuller Plan: variable Z line, negative Barrett's, small hiatal hernia  . ESOPHAGOGASTRODUODENOSCOPY N/A 02/02/2017   Dr. Oneida Alar: LA Grade B esophagitis, one moderate benign-appearing intrinsic stenosis traversed, small non-bleeding diverticulum in second portion of duodenum,  reactive gastropathy, no H.pylori.  . ESOPHAGOGASTRODUODENOSCOPY N/A 03/22/2018   Procedure: ESOPHAGOGASTRODUODENOSCOPY (EGD);  Surgeon: Daneil Dolin, MD;  [facility];  Service: Endoscopy;  Laterality: N/A;  . GIVENS CAPSULE STUDY N/A 03/22/2018   Procedure: GIVENS CAPSULE STUDY;  Surgeon: Daneil Dolin, MD;  [facility];  Service: Endoscopy;  Laterality: N/A;  . IR FLUORO GUIDE CV LINE RIGHT  10/29/2018  . IR REMOVAL TUN CV CATH W/O FL  08/06/2019  . IR US GUIDE VASC ACCESS RIGHT  10/29/2018  . PERIPHERAL VASCULAR BALLOON ANGIOPLASTY Left 03/14/2019   Procedure: PERIPHERAL VASCULAR BALLOON ANGIOPLASTY;  Surgeon: Angelia Mould, MD;  Location: [facility];  Service: Cardiovascular;  Laterality: Left;  . PERIPHERAL VASCULAR BALLOON ANGIOPLASTY  05/30/2019   Procedure: PERIPHERAL VASCULAR BALLOON ANGIOPLASTY;  Surgeon: Elam Dutch, MD;  Location: [facility];  Service: Cardiovascular;;  left arm fistula  . RETROPUBIC PROSTATECTOMY  11/26/2001  . TONSILLECTOMY    . VIDEO BRONCHOSCOPY WITH ENDOBRONCHIAL NAVIGATION N/A 09/26/2019   Procedure: VIDEO BRONCHOSCOPY WITH ENDOBRONCHIAL NAVIGATION;  Surgeon: Garner Nash, DO;  Location: [facility];  Service: Thoracic;  Laterality: N/A;  . VIDEO BRONCHOSCOPY WITH ENDOBRONCHIAL ULTRASOUND N/A 09/26/2019   Procedure: VIDEO BRONCHOSCOPY WITH ENDOBRONCHIAL ULTRASOUND;  Surgeon: Garner Nash, DO;  Location: [facility];  Service: Thoracic;  Laterality: N/A;    Family History  Problem Relation Age of Onset  . Diabetes Father   . Heart disease Father 61       MI  . Heart attack Father   . Heart attack Mother   . Heart disease Brother   . Cancer Brother        lung  . Lung cancer Brother   . Heart disease Brother   . Cancer Brother        possibly riddled with cancer  . Lung cancer Sister   . Cancer Sister        lung  . Heart disease Brother   . Lung cancer Brother   . Other Brother 19        accident  . Heart disease Brother   . Lung cancer Nephew     Social History Social History   Tobacco Use  . Smoking status: Former Smoker    Packs/day: 0.50    Years: 62.00    Pack years: 31.00    Types: Cigarettes, Cigars    Quit date: 10/27/2018    Years since quitting: 0.9  . Smokeless tobacco: Never Used  . Tobacco comment: "very little"  Substance Use Topics  . Alcohol use: No  . Drug use: No    Current Outpatient Medications  Medication Sig Dispense Refill  . acetaminophen (  TYLENOL) 325 MG tablet Take 325-650 mg by mouth every 6 (six) hours as needed for moderate pain.     [name] albuterol (PROAIR HFA) 108 (90 Base) MCG/ACT inhaler Inhale 2 puffs into the lungs 2 (two) times daily.     [name] atorvastatin (LIPITOR) 20 MG tablet Take 1 tablet (20 mg total) by mouth daily. (Needs to be seen) 90 tablet 0  . bisoprolol (ZEBETA) 10 MG tablet TAKE (1) TABLET TWICE A DAY. (Patient taking differently: Take 10 mg by mouth every morning. ) 180 tablet 1  . budesonide-formoterol (SYMBICORT) 160-4.5 MCG/ACT inhaler Inhale 2 puffs into the lungs 2 (two) times daily.    . calcitRIOL (ROCALTROL) 0.25 MCG capsule Take 1 capsule (0.25 mcg total) by mouth every morning. 90 capsule 3  . clonazePAM (KLONOPIN) 1 MG tablet TAKE 1/2 TABLET AT BEDTIME (Patient taking differently: Take 0.5 mg by mouth at bedtime. ) 15 tablet 1  . cyclobenzaprine (FLEXERIL) 10 MG tablet Take 1 tablet (10 mg total) by mouth 3 (three) times daily as needed for muscle spasms. 30 tablet 0  . diltiazem (CARDIZEM CD) 120 MG 24 hr capsule Take 1 capsule (120 mg total) by mouth daily. 90 capsule 3  . ferrous sulfate 325 (65 FE) MG tablet Take 325 mg by mouth every Monday, Wednesday, and Friday.    . fluticasone (FLONASE) 50 MCG/ACT nasal spray Place 2 sprays into both nostrils daily. (Patient taking differently: Place 2 sprays into both nostrils daily as needed for allergies. ) 16 g 6  . hydroxypropyl methylcellulose / hypromellose  (ISOPTO TEARS / GONIOVISC) 2.5 % ophthalmic solution Place 1 drop into both eyes 2 (two) times daily as needed for dry eyes.     . iron polysaccharides (FERREX 150) 150 MG capsule Take 1 capsule (150 mg total) by mouth daily. 90 capsule 0  . lidocaine-prilocaine (EMLA) cream Apply 1 application topically as needed. 30 g 0  . loratadine (CLARITIN) 10 MG tablet TAKE 1 TABLET DAILY 30 tablet 11  . Melatonin 3 MG TABS Take 3 mg by mouth at bedtime.     . Multiple Vitamin (MULTIVITAMIN WITH MINERALS) TABS tablet Take 1 tablet by mouth at bedtime.     . Multiple Vitamins-Minerals (MENS ONE DAILY PO) Take 1 tablet by mouth daily.    . Multiple Vitamins-Minerals (PRESERVISION AREDS 2 PO) Take 1 tablet by mouth 2 (two) times daily.     . Multiple Vitamins-Minerals (PRESERVISION AREDS 2+MULTI VIT) CAPS Take 1 capsule by mouth 2 (two) times daily.    . multivitamin (RENA-VIT) TABS tablet Take 1 tablet by mouth at bedtime. 30 tablet 0  . Nutritional Supplements (FEEDING SUPPLEMENT, NEPRO CARB STEADY,) LIQD Take 237 mLs by mouth 2 (two) times daily between meals. 60 Can 0  . omega-3 acid ethyl esters (LOVAZA) 1 g capsule Take 2 g by mouth 2 (two) times daily.    . Omega-3 Fatty Acids (FISH OIL) 1000 MG CAPS Take 2 capsules by mouth 2 (two) times daily.    . pantoprazole (PROTONIX) 40 MG tablet TAKE (1) TABLET TWICE A DAY. 180 tablet 0  . prochlorperazine (COMPAZINE) 10 MG tablet Take 1 tablet (10 mg total) by mouth every 6 (six) hours as needed for nausea or vomiting. 30 tablet 2  . sevelamer carbonate (RENVELA) 800 MG tablet Take 800 mg by mouth 3 (three) times daily with meals.    . terbinafine (LAMISIL) 250 MG tablet Take 3 times weekly as directed on dialysis day after dialysis  36 tablet 0  . tiotropium (SPIRIVA) 18 MCG inhalation capsule Place 18 mcg into inhaler and inhale daily.     [name] warfarin (COUMADIN) 2 MG tablet TAKE 2 TABLETS (4MG ) DAILY EXCEPT ON MONDAY AND THURS TAKE 2 & 1/2 (5MG ) 70 tablet 0    No current facility-administered medications for this visit.     Allergies  Allergen Reactions  . Wellbutrin [Bupropion] Anxiety    Review of Systems  Constitutional: Negative for unexpected weight change.  HENT: Negative for trouble swallowing and voice change.   Eyes: Negative for visual disturbance.  Respiratory: Positive for cough and shortness of breath.   Cardiovascular: Positive for palpitations. Negative for chest pain.  Gastrointestinal: Positive for blood in stool (Black stools, no BRB). Negative for abdominal pain.  Genitourinary: Negative for difficulty urinating and dysuria.  Musculoskeletal: Positive for arthralgias.  Neurological: Negative for seizures and weakness.  Hematological: Negative for adenopathy.    BP 108/60   Pulse 100   Temp 97.6 F (36.4 C) (Skin)   Resp 20   Ht 5\' 9"  (1.753 m)   Wt 157 lb (71.2 kg)   SpO2 95% Comment: RA  BMI 23.18 kg/m  Physical Exam Vitals signs reviewed.  Constitutional:      General: He is not in acute distress.    Appearance: Normal appearance.  HENT:     Head: Normocephalic and atraumatic.  Eyes:     General: No scleral icterus. Neck:     Musculoskeletal: Neck supple.  Cardiovascular:     Rate and Rhythm: Normal rate. Rhythm irregular.  Pulmonary:     Effort: Pulmonary effort is normal. No respiratory distress.     Breath sounds: Wheezing (Bibasilar) present.  Abdominal:     General: There is no distension.     Palpations: Abdomen is soft.     Tenderness: There is no abdominal tenderness.  Musculoskeletal:        General: No swelling.  Lymphadenopathy:     Cervical: No cervical adenopathy.  Skin:    General: Skin is warm and dry.  Neurological:     General: No focal deficit present.     Mental Status: He is alert and oriented to person, place, and time.     Cranial Nerves: No cranial nerve deficit.     Motor: No weakness.    Diagnostic Tests: NUCLEAR MEDICINE PET SKULL BASE TO  THIGH  TECHNIQUE: 7.66 mCi F-18 FDG was injected intravenously. Full-ring PET imaging was performed from the skull base to thigh after the radiotracer. CT data was obtained and used for attenuation correction and anatomic localization.  Fasting blood glucose: 93 mg/dl  COMPARISON:  Chest CT 09/10/2019  FINDINGS: Mediastinal blood pool activity: SUV max 2.97  Liver activity: SUV max NA  NECK: No hypermetabolic lymph nodes in the neck.  Incidental CT findings: None.  CHEST: 7 cm necrotic left lower lobe lung mass is markedly hypermetabolic with SUV max of 02.58. Central necrosis is noted. There is a small left-sided adjacent pleural effusion but I do not see any hypermetabolism.  Small scattered mediastinal and hilar lymph nodes are stable. No enlarged or hypermetabolic mediastinal or hilar lymph nodes. No other pulmonary nodules are identified. Mild underlying emphysematous changes are noted.  Incidental CT findings: Aortic and coronary artery calcifications.  ABDOMEN/PELVIS: There are bilateral low-attenuation lesion adrenal glands consistent with benign adenomas. No abnormal FDG uptake.  No evidence of hepatic metastatic disease. No abdominal/pelvic lymphadenopathy the.  Incidental CT findings: Cholelithiasis.  Moderate  to advanced atherosclerotic calcifications involving the aorta and iliac arteries but no aneurysm.  SKELETON: No suspicious bone lesions.  Incidental CT findings: none  IMPRESSION: 1. 7 cm centrally necrotic left lower lobe mass is hypermetabolic and consistent with primary lung neoplasm. 2. No enlarged or hypermetabolic mediastinal or hilar lymph nodes and no evidence of abdominal/pelvic metastatic disease or osseous metastatic disease. 3. Left-sided pleural effusion without pleural nodules or significant FDG uptake.   Electronically Signed   By: Marijo Sanes M.D.   On: 09/29/2019 14:59 I personally reviewed the PET/CT  images and concur with the findings noted above.  FVC 2.6 (54%) FEV1 1.25 (44%) FEV1 1.40 (49%) postbronchodilator RV 5.65 (214%) DLCO 11.13 (46%)  Impression: Don [name] is a 78 year old man with a history of tobacco abuse, COPD, hypertension, hyperlipidemia, end-stage renal disease on hemodialysis, chronic atrial fibrillation, iron deficiency anemia, prostate cancer, depression, and mild dementia.  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  By PET CT there is no evidence of regional or distant metastases.  He has T3, N0, stage IIb versus T4, N0, stage IIIa disease.  The tumor is anatomically resectable with a left lower lobectomy.  That would require thoracotomy due to the size of the tumor.  Is unclear whether resection of the diaphragm would be necessary as well.  Although Mr. Studnicka does have adequate pulmonary reserve to tolerate a lobectomy, his overall physical condition would make him extremely high risk and he would be unlikely to have an acceptable quality of life postoperatively.  Therefore, I would not recommend surgical resection.  He will follow-up with radiation oncology in [place] as well as oncology.  Plan: I will be happy to see Mr. Kun back if I can be of any assistance with his care in the future  Melrose Nakayama, MD [facility] [phone]

## 2019-10-13 NOTE — Telephone Encounter (Signed)
Patient is still having dark stools and hb had been down. We are holding coumadin for now because of hb dropping and coumadin is still being held. INR is 1.2.   Patient would like to put off seeing the cardiothoracic surgeon for now. Because of low hb and risk factors.  Will attempt to contact cardiothoracic surgery for the patient Caryl Pina, MD [facility] 10/13/2019, 1:25 PM

## 2019-10-14 ENCOUNTER — Institutional Professional Consult (permissible substitution): Payer: Medicare HMO | Admitting: Radiation Oncology

## 2019-10-14 ENCOUNTER — Ambulatory Visit: Payer: Medicare HMO

## 2019-10-14 ENCOUNTER — Telehealth: Payer: Self-pay

## 2019-10-14 ENCOUNTER — Encounter: Payer: Medicare HMO | Admitting: Thoracic Surgery (Cardiothoracic Vascular Surgery)

## 2019-10-14 LAB — CBC WITH DIFFERENTIAL/PLATELET
Basophils Absolute: 0 10*3/uL (ref 0.0–0.2)
Basos: 0 %
EOS (ABSOLUTE): 0.2 10*3/uL (ref 0.0–0.4)
Eos: 3 %
Hematocrit: 25.2 % — ABNORMAL LOW (ref 37.5–51.0)
Hemoglobin: 8.2 g/dL — CL (ref 13.0–17.7)
Immature Grans (Abs): 0.1 10*3/uL (ref 0.0–0.1)
Immature Granulocytes: 1 %
Lymphocytes Absolute: 1.5 10*3/uL (ref 0.7–3.1)
Lymphs: 16 %
MCH: 31.5 pg (ref 26.6–33.0)
MCHC: 32.5 g/dL (ref 31.5–35.7)
MCV: 97 fL (ref 79–97)
Monocytes Absolute: 0.8 10*3/uL (ref 0.1–0.9)
Monocytes: 9 %
Neutrophils Absolute: 6.4 10*3/uL (ref 1.4–7.0)
Neutrophils: 71 %
Platelets: 298 10*3/uL (ref 150–450)
RBC: 2.6 x10E6/uL — CL (ref 4.14–5.80)
RDW: 14.7 % (ref 11.6–15.4)
WBC: 9 10*3/uL (ref 3.4–10.8)

## 2019-10-15 ENCOUNTER — Other Ambulatory Visit: Payer: Self-pay

## 2019-10-15 ENCOUNTER — Encounter: Payer: Self-pay | Admitting: *Deleted

## 2019-10-15 ENCOUNTER — Ambulatory Visit (HOSPITAL_COMMUNITY)
Admission: RE | Admit: 2019-10-15 | Discharge: 2019-10-15 | Disposition: A | Discharge status: Home / Self Care | Payer: No Typology Code available for payment source | Source: Ambulatory Visit | Attending: Physician Assistant | Admitting: Physician Assistant

## 2019-10-15 ENCOUNTER — Other Ambulatory Visit: Payer: Self-pay | Admitting: Physician Assistant

## 2019-10-15 ENCOUNTER — Ambulatory Visit: Payer: PRIVATE HEALTH INSURANCE | Admitting: Pulmonary Disease

## 2019-10-15 DIAGNOSIS — C3492 Malignant neoplasm of unspecified part of left bronchus or lung: Secondary | ICD-10-CM

## 2019-10-15 DIAGNOSIS — C3490 Malignant neoplasm of unspecified part of unspecified bronchus or lung: Secondary | ICD-10-CM | POA: Diagnosis not present

## 2019-10-15 NOTE — Progress Notes (Signed)
Oncology Nurse Navigator Documentation  Oncology Nurse Navigator Flowsheets 10/15/2019  Abnormal Finding Date -  Confirmed Diagnosis Date -  Diagnosis Status -  Navigator Location [facility]  Referral Date to RadOnc/MedOnc -  Navigator Encounter Type Other/I followed up on Don [name] radiation appt I called [facility] and was told that they are out of network and that he needs to be seen with the [place].  I contacted our radiation dept and was updated that referral to [facility] has been faxed.  I asked radiation if they needed help to expedite to call me.    Telephone -  Treatment Initiated Date -  Treatment Phase -  Barriers/Navigation Needs Coordination of Care  Education -  Interventions Coordination of Care  Acuity Level 3-Moderate Needs (3-4 Barriers Identified)  Coordination of Care Other  Education Method -  Time Spent with Patient 45

## 2019-10-16 ENCOUNTER — Telehealth: Payer: Self-pay | Admitting: Genetic Counselor

## 2019-10-16 ENCOUNTER — Telehealth: Payer: Self-pay

## 2019-10-16 ENCOUNTER — Encounter: Payer: Self-pay | Admitting: Genetic Counselor

## 2019-10-16 DIAGNOSIS — J449 Chronic obstructive pulmonary disease, unspecified: Secondary | ICD-10-CM | POA: Diagnosis not present

## 2019-10-16 DIAGNOSIS — G8929 Other chronic pain: Secondary | ICD-10-CM | POA: Diagnosis not present

## 2019-10-16 DIAGNOSIS — I132 Hypertensive heart and chronic kidney disease with heart failure and with stage 5 chronic kidney disease, or end stage renal disease: Secondary | ICD-10-CM | POA: Diagnosis not present

## 2019-10-16 DIAGNOSIS — H3530 Unspecified macular degeneration: Secondary | ICD-10-CM | POA: Diagnosis not present

## 2019-10-16 DIAGNOSIS — N186 End stage renal disease: Secondary | ICD-10-CM | POA: Diagnosis not present

## 2019-10-16 DIAGNOSIS — F0390 Unspecified dementia without behavioral disturbance: Secondary | ICD-10-CM | POA: Diagnosis not present

## 2019-10-16 DIAGNOSIS — Z1379 Encounter for other screening for genetic and chromosomal anomalies: Secondary | ICD-10-CM | POA: Insufficient documentation

## 2019-10-16 DIAGNOSIS — I4891 Unspecified atrial fibrillation: Secondary | ICD-10-CM | POA: Diagnosis not present

## 2019-10-16 DIAGNOSIS — R079 Chest pain, unspecified: Secondary | ICD-10-CM | POA: Diagnosis not present

## 2019-10-16 DIAGNOSIS — I5033 Acute on chronic diastolic (congestive) heart failure: Secondary | ICD-10-CM | POA: Diagnosis not present

## 2019-10-16 NOTE — Telephone Encounter (Signed)
Phone call placed to POA, [name], who shared that patient has dialysis on Tues/Thurs/Sat and will begin chemotherapy on Monday. Education provided regarding Hospice vs Palliative care. Discussed Palliative program. [name] requested to speak again on Monday.

## 2019-10-16 NOTE — Telephone Encounter (Signed)
The mailbox is full and I could not leave a message.

## 2019-10-17 ENCOUNTER — Telehealth: Payer: Self-pay | Admitting: Genetic Counselor

## 2019-10-17 ENCOUNTER — Telehealth: Payer: Self-pay | Admitting: Internal Medicine

## 2019-10-17 ENCOUNTER — Other Ambulatory Visit: Payer: Self-pay

## 2019-10-17 ENCOUNTER — Ambulatory Visit: Payer: Self-pay | Admitting: Genetic Counselor

## 2019-10-17 DIAGNOSIS — Z1379 Encounter for other screening for genetic and chromosomal anomalies: Secondary | ICD-10-CM

## 2019-10-17 NOTE — Progress Notes (Signed)
HPI:  Mr. Keenum was previously seen in the [place] clinic due to a personal history of prostate and lung cancer, a personal history of polyposis and concerns regarding a hereditary predisposition to cancer. Please refer to our prior cancer genetics clinic note for more information regarding our discussion, assessment and recommendations, at the time. Mr. Pio recent genetic test results were disclosed to him, as were recommendations warranted by these results. These results and recommendations are discussed in more detail below.  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      FAMILY HISTORY:  We obtained a detailed, 4-generation family history.  Significant diagnoses are listed below: Family History  Problem Relation Age of Onset   Diabetes Father    Heart disease Father 677      MI   Heart attack Father    Heart attack Mother    Heart disease Brother    Cancer Brother        lung   Lung cancer Brother    Heart disease Brother    Cancer Brother        possibly riddled with cancer   Lung cancer Sister    Cancer Sister        lung   Heart disease Brother    Lung cancer Brother    Other Brother 253      accident   Heart disease Brother    Lung cancer Nephew     The patient has one son who is cancer free.  He has  four brothers and one sister.  His sister and two brothers were smokers and died of lung cancer.  One of these brothers had a son with lung cancer.  One brother had heart disease, but the patient thinks he also had cancer, and the fourth brother died of an accident at 265  [name] parents are deceased from heart disease.  There is no reported cancer in either the maternal or paternal family history.  Mr. GGroeneis unaware of previous family history of genetic testing for hereditary cancer risks. Patient's maternal ancestors are of Caucasian descent, and paternal ancestors are of Caucasian descent. There is no reported Ashkenazi Jewish ancestry. There is no known consanguinity.    GENETIC TEST RESULTS: Genetic testing reported out on September 16, 2019 through the [facility] cancer panel found no pathogenic mutations. The [facility] includes sequencing and rearrangement analysis for the following 91 genes: AIP, ALK, APC*, ATM*, AXIN2, BAP1, BARD1, BLM, BMPR1A, BRCA1*, BRCA2*, BRIP1*, CDC73, CDH1*, CDK4, CDKN1B, CDKN2A, CHEK2*, CTNNA1, DICER1, FANCC, FH, FLCN, GALNT12, KIF1B, LZTR1, MAX, MEN1, MET, MLH1*, MRE11A, MSH2*, MSH3, MSH6*, MUTYH*, NBN, NF1*, NF2, NTHL1, PALB2*, PHOX2B, PMS2*, POT1, PRKAR1A, PTCH1, PTEN*, RAD50, RAD51C*, RAD51D*, RB1, RECQL, RET, SDHA, SDHAF2, SDHB,  SDHC, SDHD, SMAD4, SMARCA4, SMARCB1, SMARCE1, STK11, SUFU, TMEM127, TP53*, TSC1, TSC2, VHL and XRCC2 (sequencing and deletion/duplication); CASR, CFTR, CPA1, CTRC, EGFR, EGLN1, FAM175A, HOXB13, KIT, MITF, MLH3, PALLD, PDGFRA, POLD1, POLE, PRSS1, RINT1, RPS20, SPINK1 and TERT (sequencing only); EPCAM and GREM1 (deletion/duplication only). DNA and RNA analyses performed for * genes. The test report has been scanned into EPIC and is located under the Molecular Pathology section of the Results Review tab.  A portion of the result report is included below for reference.      We discussed with Mr. Hennick that because current genetic testing is not perfect, it is possible there may be a gene mutation in one of these genes that current testing cannot detect, but that chance is small.  We also discussed, that there could be another gene that has not yet been discovered, or that we have not yet tested, that is responsible for the cancer diagnoses in the family. It is also possible there is a hereditary cause for the cancer in the family that Mr. Lusk did not inherit and therefore was not identified in his testing.  Therefore, it is important to remain in touch with cancer genetics in the future so that we can continue to offer Mr. Bernier the most up to date genetic testing.   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  At this time, it is unknown if these variants are associated with increased cancer risk or if they are normal findings, but most variants such as these get reclassified to being inconsequential. They should not be used to make medical management decisions. With time, we suspect the lab will determine the significance of these variants, if any. If we do learn more about them, we will try to contact Mr. Atienza to discuss it further. However, it is important to stay in touch with [place] periodically and keep the address and phone number up to date.  ADDITIONAL GENETIC TESTING: We discussed with Mr. Meiner that his genetic testing was fairly extensive.  If there are genes identified to increase cancer risk that can be analyzed in the future, we would be happy to discuss and coordinate this testing at that time.    CANCER SCREENING RECOMMENDATIONS: Mr. Duvall test result is considered negative (normal).  This means that we have not identified a hereditary cause for his personal history of lung and prostate cancer and a personal history of  polyposis at this time. Most cancers happen by chance and this negative test suggests that his cancer may fall into this category.    While reassuring, this does not definitively rule out a hereditary predisposition to cancer. It is still possible that there could be genetic mutations that are undetectable by current technology. There could be genetic mutations in genes that have not been tested or identified to increase cancer risk.  Therefore, it is recommended he continue to follow the cancer management and screening guidelines provided by his oncology and primary healthcare provider.   An individual's cancer risk and medical management are not determined by genetic test results alone. Overall cancer risk assessment incorporates additional factors, including personal medical history, family history, and any available genetic information that may result in a personalized plan for cancer prevention and surveillance  RECOMMENDATIONS FOR FAMILY MEMBERS:  Individuals in this family might be at some increased risk of developing cancer, over the general population risk,  simply due to the family history of cancer.  We recommended women in this family have a yearly mammogram beginning at age 1, or 26 years younger than the earliest onset of cancer, an annual clinical breast exam, and perform monthly breast self-exams. Women in this family should also have a gynecological exam as recommended by their primary provider. All family members should have a colonoscopy by age 60.  FOLLOW-UP: Lastly, we discussed with Mr. Mannan that cancer genetics is a rapidly advancing field and it is possible that new genetic tests will be appropriate for him and/or his family members in the future. We encouraged him to remain in contact with cancer genetics on an annual basis so we can update his personal and family histories and let him know of advances in cancer genetics that may benefit this family.   Our contact number was  provided. Mr. Dicostanzo questions were answered to his satisfaction, and he knows he is welcome to call us at anytime with additional questions or concerns.   Roma Kayser, [place], [facility] Licensed, Certified Genetic Counselor [email or web address]

## 2019-10-17 NOTE — Telephone Encounter (Signed)
Faxed medical records to [facility] at [phone], Release [phone]

## 2019-10-17 NOTE — Telephone Encounter (Signed)
Spoke with Don [name].  Revealed negative genetic testing.  Discussed that we do not know why he has prostate and lung cancer or why he had polyposis,  or why there is cancer in the family. It could be due to a different gene that we are not testing, or maybe our current technology may not be able to pick something up.  It will be important for him to keep in contact with genetics to keep up with whether additional testing may be needed.  There are two VUS that do not explain his history  No medical management changes based on these.

## 2019-10-20 ENCOUNTER — Inpatient Hospital Stay (HOSPITAL_BASED_OUTPATIENT_CLINIC_OR_DEPARTMENT_OTHER): Payer: No Typology Code available for payment source | Admitting: Physician Assistant

## 2019-10-20 ENCOUNTER — Inpatient Hospital Stay: Payer: No Typology Code available for payment source

## 2019-10-20 ENCOUNTER — Encounter: Payer: Self-pay | Admitting: *Deleted

## 2019-10-20 ENCOUNTER — Encounter: Payer: Self-pay | Admitting: Physician Assistant

## 2019-10-20 ENCOUNTER — Telehealth: Payer: Self-pay | Admitting: *Deleted

## 2019-10-20 ENCOUNTER — Other Ambulatory Visit: Payer: Self-pay

## 2019-10-20 VITALS — BP 105/74 | HR 100 | Temp 98.5°F | Resp 17 | Ht 69.0 in | Wt 158.3 lb

## 2019-10-20 DIAGNOSIS — C3492 Malignant neoplasm of unspecified part of left bronchus or lung: Secondary | ICD-10-CM

## 2019-10-20 DIAGNOSIS — Z5111 Encounter for antineoplastic chemotherapy: Secondary | ICD-10-CM | POA: Diagnosis present

## 2019-10-20 DIAGNOSIS — Z510 Encounter for antineoplastic radiation therapy: Secondary | ICD-10-CM | POA: Diagnosis present

## 2019-10-20 DIAGNOSIS — S199XXA Unspecified injury of neck, initial encounter: Secondary | ICD-10-CM | POA: Diagnosis not present

## 2019-10-20 DIAGNOSIS — D638 Anemia in other chronic diseases classified elsewhere: Secondary | ICD-10-CM | POA: Diagnosis not present

## 2019-10-20 DIAGNOSIS — R4182 Altered mental status, unspecified: Secondary | ICD-10-CM | POA: Diagnosis not present

## 2019-10-20 DIAGNOSIS — N186 End stage renal disease: Secondary | ICD-10-CM

## 2019-10-20 DIAGNOSIS — S0990XA Unspecified injury of head, initial encounter: Secondary | ICD-10-CM | POA: Diagnosis not present

## 2019-10-20 DIAGNOSIS — Z992 Dependence on renal dialysis: Secondary | ICD-10-CM | POA: Diagnosis not present

## 2019-10-20 DIAGNOSIS — C3432 Malignant neoplasm of lower lobe, left bronchus or lung: Secondary | ICD-10-CM | POA: Diagnosis present

## 2019-10-20 DIAGNOSIS — R072 Precordial pain: Secondary | ICD-10-CM | POA: Diagnosis not present

## 2019-10-20 DIAGNOSIS — S0003XA Contusion of scalp, initial encounter: Secondary | ICD-10-CM | POA: Diagnosis not present

## 2019-10-20 DIAGNOSIS — R079 Chest pain, unspecified: Secondary | ICD-10-CM | POA: Diagnosis not present

## 2019-10-20 LAB — CMP (CANCER CENTER ONLY)
ALT: 13 U/L (ref 0–44)
AST: 14 U/L — ABNORMAL LOW (ref 15–41)
Albumin: 2.6 g/dL — ABNORMAL LOW (ref 3.5–5.0)
Alkaline Phosphatase: 146 U/L — ABNORMAL HIGH (ref 38–126)
Anion gap: 13 (ref 5–15)
BUN: 47 mg/dL — ABNORMAL HIGH (ref 8–23)
CO2: 30 mmol/L (ref 22–32)
Calcium: 9.1 mg/dL (ref 8.9–10.3)
Chloride: 100 mmol/L (ref 98–111)
Creatinine: 5.37 mg/dL (ref 0.61–1.24)
GFR, Est AFR Am: 11 mL/min — ABNORMAL LOW (ref >60)
GFR, Estimated: 9 mL/min — ABNORMAL LOW (ref >60)
Glucose, Bld: 140 mg/dL — ABNORMAL HIGH (ref 70–99)
Potassium: 4.2 mmol/L (ref 3.5–5.1)
Sodium: 143 mmol/L (ref 135–145)
Total Bilirubin: 0.5 mg/dL (ref 0.3–1.2)
Total Protein: 6.4 g/dL — ABNORMAL LOW (ref 6.5–8.1)

## 2019-10-20 LAB — CBC WITH DIFFERENTIAL (CANCER CENTER ONLY)
Abs Immature Granulocytes: 0.04 10*3/uL (ref 0.00–0.07)
Basophils Absolute: 0 10*3/uL (ref 0.0–0.1)
Basophils Relative: 0 %
Eosinophils Absolute: 0.2 10*3/uL (ref 0.0–0.5)
Eosinophils Relative: 2 %
HCT: 26.7 % — ABNORMAL LOW (ref 39.0–52.0)
Hemoglobin: 8.4 g/dL — ABNORMAL LOW (ref 13.0–17.0)
Immature Granulocytes: 0 %
Lymphocytes Relative: 16 %
Lymphs Abs: 1.4 10*3/uL (ref 0.7–4.0)
MCH: 31.6 pg (ref 26.0–34.0)
MCHC: 31.5 g/dL (ref 30.0–36.0)
MCV: 100.4 fL — ABNORMAL HIGH (ref 80.0–100.0)
Monocytes Absolute: 0.8 10*3/uL (ref 0.1–1.0)
Monocytes Relative: 9 %
Neutro Abs: 6.5 10*3/uL (ref 1.7–7.7)
Neutrophils Relative %: 73 %
Platelet Count: 271 10*3/uL (ref 150–400)
RBC: 2.66 MIL/uL — ABNORMAL LOW (ref 4.22–5.81)
RDW: 16.7 % — ABNORMAL HIGH (ref 11.5–15.5)
WBC Count: 9 10*3/uL (ref 4.0–10.5)
nRBC: 0 % (ref 0.0–0.2)

## 2019-10-20 NOTE — Progress Notes (Signed)
Notified Don Rice, RN of [facility]  5.37 today.  Don [name] stated she would relay message to [place], [place].  Pt is seeing PA today.

## 2019-10-20 NOTE — Telephone Encounter (Signed)
Oncology Nurse Navigator Documentation  Oncology Nurse Navigator Flowsheets 10/20/2019  Abnormal Finding Date -  Confirmed Diagnosis Date -  Diagnosis Status -  Navigator Location [facility]  Referral Date to RadOnc/MedOnc -  Navigator Encounter Type Telephone/I received a message from Dr. Worthy Flank PA.  She updated me that Dr. Julien Nordmann is wanting to get his rad onc tx started ASAP.  I updated her that insurance Josem Kaufmann is difficult to obtain from [place].  Patient would like to been in [facility] to get his rad onc due to his HD 3 days a week.  I contacted [place] navigator to see if she can help.  I was unable to reach but did leave vm message.   Telephone Outgoing Call  Treatment Initiated Date -  Treatment Phase -  Barriers/Navigation Needs Coordination of Care;Education  Education Other  Interventions Coordination of Care  Acuity Level 3-Moderate Needs (3-4 Barriers Identified)  Coordination of Care Other  Education Method -  Time Spent with Patient 30

## 2019-10-20 NOTE — Progress Notes (Signed)
I contacted the leader of the [place] at the cancer center, Ulice Dash.  I asked if she had any suggestions on how we can better help Mr. Kimball getting Don [name] for his rad onc treatments.

## 2019-10-20 NOTE — Telephone Encounter (Signed)
Fax received [facility]INR PT/INR self testing service Test date/time 10/16/19 3:16 pm INR 1.3

## 2019-10-20 NOTE — Progress Notes (Signed)
CRITICAL VALUE STICKER  CRITICAL VALUE: Crt 5.37  RECEIVER (on-site recipient of call): Rhae Hammock, RN  DATE & TIME NOTIFIED: 10/20/2019 @10 :45 am  MESSENGER (representative from lab):  MD NOTIFIED: Cassandra Heilingoetter, PA  TIME OF NOTIFICATION: 12/07/20200 @ 10:55 am  RESPONSE: Aware patient is a dialysis patient.  No treatment today due to other reasons.

## 2019-10-20 NOTE — Progress Notes (Signed)
Oncology Nurse Navigator Documentation  Oncology Nurse Navigator Flowsheets 10/20/2019  Abnormal Finding Date -  Confirmed Diagnosis Date -  Diagnosis Status -  Navigator Location [facility]  Referral Date to RadOnc/MedOnc -  Navigator Encounter Type Other/I received a call from radiation oncology checking to see if I have heard from the [place] about setting Don [name] up with treatment.  I have not heard anything yet.  I called [facility] but was unable to reach her or leave vm message.    Telephone -  Treatment Initiated Date -  Treatment Phase -  Barriers/Navigation Needs Coordination of Care  Education -  Interventions Coordination of Care  Acuity Level 3-Moderate Needs (3-4 Barriers Identified)  Coordination of Care Other  Education Method -  Time Spent with Patient 30

## 2019-10-20 NOTE — Progress Notes (Signed)
[facility] OFFICE PROGRESS NOTE  Dettinger, Fransisca Kaufmann, MD [place] [address]  DIAGNOSIS: Stage IIb (T3, N0, M0) non-small cell lung cancer, squamous cell carcinoma diagnosed in November 2020 and presented with large cavitary left lower lobe lung mass with no significant hilar or mediastinal lymphadenopathy and no distant metastatic disease.  PRIOR THERAPY: None  CURRENT THERAPY: Weekly concurrent chemoradiation with carboplatin for an AUC of 2 and paclitaxel 45 mg/m.  First dose 10/27/2019. Radiation treatment yet to be determined.   INTERVAL HISTORY: Don WIENEKE 78 y.o. male returns to the clinic for a follow up visit. The patient is feeling fair today without any concerning complaints except he is anticipating he will feel weaker after started chemotherapy, which concerns him. His weakness is multifactorial. He is on dialysis for ESRD and often feels weak following treatment. He also recently was found to have heme positive stool and anemia. When asked if he is still having dark stool, he stated "most definitely". He had been on anticolagulation with Coumidin for his atrial fibrillation, but this was recently discontinued by his PCP approximately 1-2 weeks ago, per the patient's stepson. The patient has an appointment to have a colonoscopy in the near future. Given all of this, the patient is concerned that chemotherapy will exacerbate his weakness. The patient lives alone in a mobile home. He ambulates without assistance except he occasionally uses a cane. He used to have someone through his step-son's church provide assistance for him in the home; however, he is unable to assist at this time to to increasing demands with his other job's work schedule.   There are some constraints with the patient starting radiation therapy. Currently, the patient's case is being followed closely to establish his radiation treatment in [place]. He does not have a start date at this  time.   Today, the patient denies any fevers, chills, night sweats, or weight loss. He reports his baseline shortness of breath with exertion as well as occasional unchanged chronic cough. He denies any diarrhea, nausea, vomiting, or constipation. He denies any headaches or visual changes. He recently had a staging brain MRI which was negative for metastatic disease to the brain. He is here for evaluation before starting his first cycle of chemotherapy.     MEDICAL HISTORY: Past Medical History:  Diagnosis Date  . Acute bronchitis 04/03/2014  . Anemia    low iron  . Anxiety   . Atrial fibrillation ([place])   . Cataract   . COPD (chronic obstructive pulmonary disease) ([place])   . Dementia ([place])   . Depression   . Dyspnea    with activity  . ESRD (end stage renal disease) ([place]) 04/03/2014   Stage 5 Dialysis on T/Th/Sa  . Essential hypertension   . Hyperlipidemia   . Macular degeneration   . Noncompliance with medications 04/2013   Xarelto, digoxin previously  . Polyposis coli   . Pre-diabetes   . Prostate cancer ([place])   . Tubular adenoma of colon 07/31/02, 11/18/03    ALLERGIES:  is allergic to wellbutrin [bupropion].  MEDICATIONS:  Current Outpatient Medications  Medication Sig Dispense Refill  . acetaminophen (TYLENOL) 325 MG tablet Take 325-650 mg by mouth every 6 (six) hours as needed for moderate pain.     [name] albuterol (PROAIR HFA) 108 (90 Base) MCG/ACT inhaler Inhale 2 puffs into the lungs 2 (two) times daily.     [name] atorvastatin (LIPITOR) 20 MG tablet Take 1 tablet (20  mg total) by mouth daily. (Needs to be seen) 90 tablet 0  . bisoprolol (ZEBETA) 10 MG tablet TAKE (1) TABLET TWICE A DAY. (Patient taking differently: Take 10 mg by mouth every morning. ) 180 tablet 1  . budesonide-formoterol (SYMBICORT) 160-4.5 MCG/ACT inhaler Inhale 2 puffs into the lungs 2 (two) times daily.    . calcitRIOL (ROCALTROL) 0.25 MCG capsule Take 1 capsule (0.25 mcg total) by mouth every morning. 90  capsule 3  . clonazePAM (KLONOPIN) 1 MG tablet TAKE 1/2 TABLET AT BEDTIME (Patient taking differently: Take 0.5 mg by mouth at bedtime. ) 15 tablet 1  . cyclobenzaprine (FLEXERIL) 10 MG tablet Take 1 tablet (10 mg total) by mouth 3 (three) times daily as needed for muscle spasms. 30 tablet 0  . diltiazem (CARDIZEM CD) 120 MG 24 hr capsule Take 1 capsule (120 mg total) by mouth daily. 90 capsule 3  . ferrous sulfate 325 (65 FE) MG tablet Take 325 mg by mouth every Monday, Wednesday, and Friday.    . fluticasone (FLONASE) 50 MCG/ACT nasal spray Place 2 sprays into both nostrils daily. (Patient taking differently: Place 2 sprays into both nostrils daily as needed for allergies. ) 16 g 6  . hydroxypropyl methylcellulose / hypromellose (ISOPTO TEARS / GONIOVISC) 2.5 % ophthalmic solution Place 1 drop into both eyes 2 (two) times daily as needed for dry eyes.     . iron polysaccharides (FERREX 150) 150 MG capsule Take 1 capsule (150 mg total) by mouth daily. 90 capsule 0  . lidocaine-prilocaine (EMLA) cream Apply 1 application topically as needed. 30 g 0  . loratadine (CLARITIN) 10 MG tablet TAKE 1 TABLET DAILY 30 tablet 11  . Melatonin 3 MG TABS Take 3 mg by mouth at bedtime.     . Multiple Vitamin (MULTIVITAMIN WITH MINERALS) TABS tablet Take 1 tablet by mouth at bedtime.     . Multiple Vitamins-Minerals (MENS ONE DAILY PO) Take 1 tablet by mouth daily.    . Multiple Vitamins-Minerals (PRESERVISION AREDS 2 PO) Take 1 tablet by mouth 2 (two) times daily.     . Multiple Vitamins-Minerals (PRESERVISION AREDS 2+MULTI VIT) CAPS Take 1 capsule by mouth 2 (two) times daily.    . multivitamin (RENA-VIT) TABS tablet Take 1 tablet by mouth at bedtime. 30 tablet 0  . Nutritional Supplements (FEEDING SUPPLEMENT, NEPRO CARB STEADY,) LIQD Take 237 mLs by mouth 2 (two) times daily between meals. 60 Can 0  . omega-3 acid ethyl esters (LOVAZA) 1 g capsule Take 2 g by mouth 2 (two) times daily.    . Omega-3 Fatty Acids  (FISH OIL) 1000 MG CAPS Take 2 capsules by mouth 2 (two) times daily.    . pantoprazole (PROTONIX) 40 MG tablet TAKE (1) TABLET TWICE A DAY. 180 tablet 0  . prochlorperazine (COMPAZINE) 10 MG tablet Take 1 tablet (10 mg total) by mouth every 6 (six) hours as needed for nausea or vomiting. 30 tablet 2  . sevelamer carbonate (RENVELA) 800 MG tablet Take 800 mg by mouth 3 (three) times daily with meals.    . terbinafine (LAMISIL) 250 MG tablet Take 3 times weekly as directed on dialysis day after dialysis 36 tablet 0  . tiotropium (SPIRIVA) 18 MCG inhalation capsule Place 18 mcg into inhaler and inhale daily.     [name] warfarin (COUMADIN) 2 MG tablet TAKE 2 TABLETS (4MG ) DAILY EXCEPT ON MONDAY AND THURS TAKE 2 & 1/2 (5MG ) 70 tablet 0   No current facility-administered medications for  this visit.     SURGICAL HISTORY:  Past Surgical History:  Procedure Laterality Date  . A/V FISTULAGRAM Left 05/30/2019   Procedure: A/V FISTULAGRAM;  Surgeon: Elam Dutch, MD;  Location: [facility];  Service: Cardiovascular;  Laterality: Left;  . Anal abcess,Hemorroids,    . AV FISTULA PLACEMENT Left 06/23/2019   Procedure: INSERTION OF ARTERIOVENOUS (AV) GORE-TEX GRAFT ARM;  Surgeon: Elam Dutch, MD;  Location: [facility];  Service: Vascular;  Laterality: Left;  . BASCILIC VEIN TRANSPOSITION Left 10/08/2018   Procedure: FIRST STAGE BASILIC VEIN TRANSPOSITION LEFT ARM;  Surgeon: Angelia Mould, MD;  Location: [facility];  Service: Vascular;  Laterality: Left;  . BASCILIC VEIN TRANSPOSITION Left 12/16/2018   Procedure: BASILIC VEIN TRANSPOSITION SECOND STAGE;  Surgeon: Angelia Mould, MD;  Location: [facility];  Service: Vascular;  Laterality: Left;  . COLONOSCOPY N/A 04/05/2014   Dr. Fuller Plan: 6 mm sessile polyp from sigmoid colon (tubular adenoma), internal hemorrhoids  . COLONOSCOPY W/ BIOPSIES  11/18/2003   Dr. Earle Gell  . ESOPHAGOGASTRODUODENOSCOPY  11/18/2003   Dr. Earle Gell  .  ESOPHAGOGASTRODUODENOSCOPY N/A 04/05/2014   Dr. Fuller Plan: variable Z line, negative Barrett's, small hiatal hernia  . ESOPHAGOGASTRODUODENOSCOPY N/A 02/02/2017   Dr. Oneida Alar: LA Grade B esophagitis, one moderate benign-appearing intrinsic stenosis traversed, small non-bleeding diverticulum in second portion of duodenum, reactive gastropathy, no H.pylori.  . ESOPHAGOGASTRODUODENOSCOPY N/A 03/22/2018   Procedure: ESOPHAGOGASTRODUODENOSCOPY (EGD);  Surgeon: Daneil Dolin, MD;  [facility];  Service: Endoscopy;  Laterality: N/A;  . GIVENS CAPSULE STUDY N/A 03/22/2018   Procedure: GIVENS CAPSULE STUDY;  Surgeon: Daneil Dolin, MD;  [facility];  Service: Endoscopy;  Laterality: N/A;  . IR FLUORO GUIDE CV LINE RIGHT  10/29/2018  . IR REMOVAL TUN CV CATH W/O FL  08/06/2019  . IR US GUIDE VASC ACCESS RIGHT  10/29/2018  . PERIPHERAL VASCULAR BALLOON ANGIOPLASTY Left 03/14/2019   Procedure: PERIPHERAL VASCULAR BALLOON ANGIOPLASTY;  Surgeon: Angelia Mould, MD;  Location: [facility];  Service: Cardiovascular;  Laterality: Left;  . PERIPHERAL VASCULAR BALLOON ANGIOPLASTY  05/30/2019   Procedure: PERIPHERAL VASCULAR BALLOON ANGIOPLASTY;  Surgeon: Elam Dutch, MD;  Location: [facility];  Service: Cardiovascular;;  left arm fistula  . RETROPUBIC PROSTATECTOMY  11/26/2001  . TONSILLECTOMY    . VIDEO BRONCHOSCOPY WITH ENDOBRONCHIAL NAVIGATION N/A 09/26/2019   Procedure: VIDEO BRONCHOSCOPY WITH ENDOBRONCHIAL NAVIGATION;  Surgeon: Garner Nash, DO;  Location: [facility];  Service: Thoracic;  Laterality: N/A;  . VIDEO BRONCHOSCOPY WITH ENDOBRONCHIAL ULTRASOUND N/A 09/26/2019   Procedure: VIDEO BRONCHOSCOPY WITH ENDOBRONCHIAL ULTRASOUND;  Surgeon: Garner Nash, DO;  Location: [facility];  Service: Thoracic;  Laterality: N/A;    REVIEW OF SYSTEMS:   Review of Systems  Constitutional: Positive for fatigue.  Negative for appetite change, chills,  fever and unexpected  weight change.  HENT: Negative for mouth sores, nosebleeds, sore throat and trouble swallowing.   Eyes: Negative for eye problems and icterus.  Respiratory: Positive for baseline cough and shortness of breath. Negative for hemoptysis and wheezing.   Cardiovascular: Negative for chest pain and leg swelling.  Gastrointestinal: Positive for melena. Negative for abdominal pain, constipation, diarrhea, nausea and vomiting.  Genitourinary: Positive for ESRD on dialysis.   Musculoskeletal: Negative for back pain, gait problem, neck pain and neck stiffness.  Skin: Negative for itching and rash.  Neurological: Negative for dizziness, extremity weakness, gait problem, headaches, light-headedness and  seizures.  Hematological: Negative for adenopathy. Does not bruise/bleed easily.  Psychiatric/Behavioral: Positive for some memory impairment. Negative for confusion, depression and sleep disturbance. The patient is not nervous/anxious.     PHYSICAL EXAMINATION:  Blood pressure 105/74, pulse 100, temperature 98.5 F (36.9 C), temperature source Oral, resp. rate 17, height 5\' 9"  (1.753 m), weight 158 lb 4.8 oz (71.8 kg), SpO2 100 %.  ECOG PERFORMANCE STATUS: 1 - Symptomatic but completely ambulatory  Physical Exam  Constitutional: Oriented to person, place, and time and chronically ill appearing male and in no distress.  HENT:  Head: Normocephalic and atraumatic.  Mouth/Throat: Oropharynx is clear and moist. No oropharyngeal exudate.  Eyes: Conjunctivae are normal. Right eye exhibits no discharge. Left eye exhibits no discharge. No scleral icterus.  Neck: Normal range of motion. Neck supple.  Cardiovascular: Normal rate, irregular rhythm, normal heart sounds and intact distal pulses.   Pulmonary/Chest: Effort normal. Mild wheezing. No respiratory distress.  No rales.  Abdominal: Soft. Bowel sounds are normal. Exhibits no distension and no mass. There is no tenderness.  Musculoskeletal: Normal range  of motion. Exhibits no edema.  Lymphadenopathy:    No cervical adenopathy.  Neurological: Alert and oriented to person, place, and time. Exhibits normal muscle tone. Gait normal. Coordination normal.  Skin: Skin is warm and dry. No rash noted. Not diaphoretic. No erythema. No pallor.  Psychiatric: Mood, memory and judgment normal.  Vitals reviewed.  LABORATORY DATA: Lab Results  Component Value Date   WBC 9.0 10/20/2019   HGB 8.4 (L) 10/20/2019   HCT 26.7 (L) 10/20/2019   MCV 100.4 (H) 10/20/2019   PLT 271 10/20/2019      Chemistry      Component Value Date/Time   NA 143 10/20/2019 0956   NA 143 02/27/2019 1120   K 4.2 10/20/2019 0956   CL 100 10/20/2019 0956   CO2 30 10/20/2019 0956   BUN 47 (H) 10/20/2019 0956   BUN 59 (H) 02/27/2019 1120   CREATININE 5.37 (HH) 10/20/2019 0956   CREATININE 1.41 (H) 05/15/2013 1150      Component Value Date/Time   CALCIUM 9.1 10/20/2019 0956   ALKPHOS 146 (H) 10/20/2019 0956   AST 14 (L) 10/20/2019 0956   ALT 13 10/20/2019 0956   BILITOT 0.5 10/20/2019 0956       RADIOGRAPHIC STUDIES:  Mr Brain Wo Contrast  Result Date: 10/15/2019 CLINICAL DATA:  Lung cancer staging EXAM: MRI HEAD WITHOUT CONTRAST TECHNIQUE: Multiplanar, multiecho pulse sequences of the brain and surrounding structures were obtained without intravenous contrast. COMPARISON:  MRI head 02/18/2018 FINDINGS: Brain: Intravenous contrast not administered due to GFR 12. Moderate atrophy. Multiple white matter hyperintensities throughout the cerebral hemispheres bilaterally are similar to the prior study. No vasogenic edema. Brainstem and cerebellum intact. Negative for hemorrhage. Negative for metastatic disease on unenhanced imaging. Vascular: Normal arterial flow voids. Skull and upper cervical spine: Negative Sinuses/Orbits: Mild mucosal edema paranasal sinuses. No orbital mass. Bilateral mastoid effusion. Left cataract surgery Other: None IMPRESSION: Atrophy and chronic  microvascular ischemia. No acute infarct. No evidence of metastatic disease on unenhanced imaging. Small lesions could be missed without intravenous contrast. Electronically Signed   By: Franchot Gallo M.D.   On: 10/15/2019 15:26   Nm Pet - Initial Skull Base To Thigh  Result Date: 09/29/2019 CLINICAL DATA:  Initial treatment strategy for left lung mass. EXAM: NUCLEAR MEDICINE PET SKULL BASE TO THIGH TECHNIQUE: 7.66 mCi F-18 FDG was injected intravenously. Full-ring PET imaging was performed  from the skull base to thigh after the radiotracer. CT data was obtained and used for attenuation correction and anatomic localization. Fasting blood glucose: 93 mg/dl COMPARISON:  Chest CT 09/10/2019 FINDINGS: Mediastinal blood pool activity: SUV max 2.97 Liver activity: SUV max NA NECK: No hypermetabolic lymph nodes in the neck. Incidental CT findings: None. CHEST: 7 cm necrotic left lower lobe lung mass is markedly hypermetabolic with SUV max of 75.64. Central necrosis is noted. There is a small left-sided adjacent pleural effusion but I do not see any hypermetabolism. Small scattered mediastinal and hilar lymph nodes are stable. No enlarged or hypermetabolic mediastinal or hilar lymph nodes. No other pulmonary nodules are identified. Mild underlying emphysematous changes are noted. Incidental CT findings: Aortic and coronary artery calcifications. ABDOMEN/PELVIS: There are bilateral low-attenuation lesion adrenal glands consistent with benign adenomas. No abnormal FDG uptake. No evidence of hepatic metastatic disease. No abdominal/pelvic lymphadenopathy the. Incidental CT findings: Cholelithiasis. Moderate to advanced atherosclerotic calcifications involving the aorta and iliac arteries but no aneurysm. SKELETON: No suspicious bone lesions. Incidental CT findings: none IMPRESSION: 1. 7 cm centrally necrotic left lower lobe mass is hypermetabolic and consistent with primary lung neoplasm. 2. No enlarged or hypermetabolic  mediastinal or hilar lymph nodes and no evidence of abdominal/pelvic metastatic disease or osseous metastatic disease. 3. Left-sided pleural effusion without pleural nodules or significant FDG uptake. Electronically Signed   By: Marijo Sanes M.D.   On: 09/29/2019 14:59   Dg Chest Port 1 View  Result Date: 09/26/2019 CLINICAL DATA:  Postop bronchoscopy EXAM: PORTABLE CHEST 1 VIEW COMPARISON:  09/10/2019 FINDINGS: Persistent left lower lobe mass. Left basilar airspace disease likely reflecting pneumonitis and atelectasis. Small left pleural effusion. Right lung is clear. No pneumothorax. Stable cardiomediastinal silhouette. IMPRESSION: 1. Persistent left lower lobe mass. Left basilar airspace disease likely reflecting pneumonitis and atelectasis. Small left pleural effusion. Electronically Signed   By: Kathreen Devoid   On: 09/26/2019 09:53   Dg C-arm Bronchoscopy  Result Date: 09/26/2019 C-ARM BRONCHOSCOPY: Fluoroscopy was utilized by the requesting physician.  No radiographic interpretation.     ASSESSMENT/PLAN:  This is a very pleasant 78 year old Caucasian male recently diagnosed with stage IIb (T3, N0, M0) non-small cell lung cancer, squamous cell carcinoma.  He presented with a large cavitary left lower lobe lung mass with no significant hilar or mediastinal lymphadenopathy.  And no distant metastasis.  He was diagnosed in November 2020.  He is currently planing to undergo concurrent chemoradiation with carboplatin for an AUC of 2 and paclitaxel 45 mg/m.  He is scheduled to receive his first cycle of treatment today. His radiation treatment has not been established yet but is being looked into.   The patient was seen with Dr. Julien Nordmann today.  Labs were reviewed.  Considering that the patient's radiation is not established yet, Dr. Julien Nordmann would like to delay his chemotherapy by 1 week since chemotherapy is to be given concurrently with radiation.   We will see the patient back for a follow up  visit in 1 week for evaluation before starting cycle #1.   The patient has multiple concerns related to fears with assistance at home, transportation, and financial concerns. I have placed a referral to social work to reach out to the patient and his step-son to explore further needs.   The patient was encouraged to please keep his appointment for his colonoscopy. His hemoglobin is stable at 8.4 today. I will add sample to blood bank to be added for his weekly  labs next week.   The patient was advised to call immediately if he has any concerning symptoms in the interval. The patient voices understanding of current disease status and treatment options and is in agreement with the current care plan. All questions were answered. The patient knows to call the clinic with any problems, questions or concerns. We can certainly see the patient much sooner if necessary  Orders Placed This Encounter  Procedures  . Sample to Blood Bank    Standing Status:   Future    Standing Expiration Date:   10/19/2020     Tobe Sos Heilingoetter, PA-C 10/20/19  ADDENDUM: Hematology/Oncology Attending: I had a face-to-face encounter with the patient today.  I recommended his care plan.  This is a very pleasant 78 years old white male diagnosed with a stage IIb non-small cell lung cancer, squamous cell carcinoma presented with large cavitary left lower lobe lung mass with no hilar or mediastinal lymphadenopathy.  The patient is not a surgical candidate for resection.  He is scheduled to have a course of concurrent chemoradiation with weekly carboplatin and paclitaxel.  Unfortunately there are some issues with his radiation treatment.  He was supposed to get radiotherapy close to home in [place] but this is out of his network and it was declined. We will arrange for the patient to meet with radiation oncology in [facility] for reevaluation but just need approval from his [place] facility. We will continue to hold his systemic  chemotherapy until the patient meet with radiation so he can proceed with a course of concurrent chemoradiation together. We expect him to start this treatment next week if everything goes as planned. For the end-stage renal disease, he will continue his hemodialysis in [place]. He will come back for follow-up visit next week for reevaluation. The patient was advised to call immediately if he has any other concerning symptoms in the interval.  Disclaimer: This note was dictated with voice recognition software. Similar sounding words can inadvertently be transcribed and may be missed upon review. Eilleen Kempf, MD 10/20/19

## 2019-10-21 ENCOUNTER — Encounter: Payer: Self-pay | Admitting: *Deleted

## 2019-10-21 ENCOUNTER — Telehealth: Payer: Self-pay | Admitting: Internal Medicine

## 2019-10-21 ENCOUNTER — Ambulatory Visit: Payer: Medicare HMO | Admitting: Internal Medicine

## 2019-10-21 ENCOUNTER — Telehealth: Payer: Self-pay | Admitting: Physician Assistant

## 2019-10-21 ENCOUNTER — Other Ambulatory Visit: Payer: Self-pay | Admitting: Physician Assistant

## 2019-10-21 ENCOUNTER — Ambulatory Visit: Payer: Medicare HMO

## 2019-10-21 ENCOUNTER — Other Ambulatory Visit: Payer: Medicare HMO

## 2019-10-21 DIAGNOSIS — C3492 Malignant neoplasm of unspecified part of left bronchus or lung: Secondary | ICD-10-CM

## 2019-10-21 NOTE — Progress Notes (Signed)
Oncology Nurse Navigator Documentation  Oncology Nurse Navigator Flowsheets 10/21/2019  Abnormal Finding Date -  Confirmed Diagnosis Date -  Diagnosis Status -  Navigator Location [facility]  Referral Date to RadOnc/MedOnc -  Navigator Encounter Type Other/I received a call from the [place].  I updated them on need for rad on appt due to treatment plan of concurrent chemo rad.  Apparently, a request needs to be completed and faxed to [place].  I also updated that patient would like to get rad onc in [place] due to HD there.  They will try to accommodate.    Telephone -  Treatment Initiated Date -  Treatment Phase -  Barriers/Navigation Needs Coordination of Care  Education -  Interventions Coordination of Care  Acuity Level 2-Minimal Needs (1-2 Barriers Identified)  Coordination of Care Other  Education Method -  Time Spent with Patient 15

## 2019-10-21 NOTE — Telephone Encounter (Signed)
Scheduled appt per 12/7 los.  Spoke with pt and he is aware of the added appt.

## 2019-10-21 NOTE — Telephone Encounter (Signed)
Description   Goal INR 2.0-3.0 INR today is 1.3  Patient is still having dark stools, his hb is stable, restart coumadin on 10/28/2019 Patient will hold for now and restart at previous dose of 4 mg every day     Spoke with patient's son Caryl Pina, MD [place] 10/21/2019, 11:46 AM

## 2019-10-21 NOTE — Progress Notes (Signed)
Faxed referral for radiation oncology request to [facility]

## 2019-10-21 NOTE — Telephone Encounter (Signed)
Patient son is getting upset with this because he said you have been holding his coumadin for the last 2 weeks. So he wants to know what is going on because he was told to hold it. Per last phone note on 10/13/19 you held it. Patients son is asking to speak with you now. Because he is very upset and confused about this.

## 2019-10-21 NOTE — Telephone Encounter (Signed)
Description   Goal INR 2.0-3.0 INR today is 1.3  Increase dose to:  4 mg daily  Except on Monday, wednesdays, and Friday take 6 mg on those days Recheck in 1 week I do not know why he is going in the wrong direction despite an increase, can we call and verify his dosing and that he is getting the correct dosing       Caryl Pina, MD [place] 10/21/2019, 9:11 AM

## 2019-10-21 NOTE — Telephone Encounter (Signed)
Spoke with patient's son [name]) regarding Palliative services and all questions were answered.  Son wanted [place] to contact the [place] to get their approval for Palliative services before he would schedule an appointment.  He gave me the contact name and number of the person at the [place] and I told him that we would follow-up with them and then I would call him back after we spoke with them and he was in agreement with this.

## 2019-10-22 ENCOUNTER — Telehealth: Payer: Self-pay | Admitting: *Deleted

## 2019-10-22 ENCOUNTER — Encounter: Payer: Self-pay | Admitting: *Deleted

## 2019-10-22 DIAGNOSIS — G8929 Other chronic pain: Secondary | ICD-10-CM | POA: Diagnosis not present

## 2019-10-22 DIAGNOSIS — J449 Chronic obstructive pulmonary disease, unspecified: Secondary | ICD-10-CM | POA: Diagnosis not present

## 2019-10-22 DIAGNOSIS — F0390 Unspecified dementia without behavioral disturbance: Secondary | ICD-10-CM | POA: Diagnosis not present

## 2019-10-22 DIAGNOSIS — H3530 Unspecified macular degeneration: Secondary | ICD-10-CM | POA: Diagnosis not present

## 2019-10-22 DIAGNOSIS — I132 Hypertensive heart and chronic kidney disease with heart failure and with stage 5 chronic kidney disease, or end stage renal disease: Secondary | ICD-10-CM | POA: Diagnosis not present

## 2019-10-22 DIAGNOSIS — I4891 Unspecified atrial fibrillation: Secondary | ICD-10-CM | POA: Diagnosis not present

## 2019-10-22 DIAGNOSIS — N186 End stage renal disease: Secondary | ICD-10-CM | POA: Diagnosis not present

## 2019-10-22 DIAGNOSIS — R079 Chest pain, unspecified: Secondary | ICD-10-CM | POA: Diagnosis not present

## 2019-10-22 DIAGNOSIS — I5033 Acute on chronic diastolic (congestive) heart failure: Secondary | ICD-10-CM | POA: Diagnosis not present

## 2019-10-22 NOTE — Progress Notes (Signed)
Oncology Nurse Navigator Documentation  Oncology Nurse Navigator Flowsheets 10/22/2019  Abnormal Finding Date -  Confirmed Diagnosis Date -  Diagnosis Status -  Navigator Location [facility]  Referral Date to RadOnc/MedOnc -  Navigator Encounter Type Other/I received a call from the [place].  They are working with patients PCP to get rad onc referral completed.  I will update Dr. Julien Nordmann.   Telephone -  Treatment Initiated Date -  Treatment Phase -  Barriers/Navigation Needs Coordination of Care  Education -  Interventions Coordination of Care  Acuity Level 2-Minimal Needs (1-2 Barriers Identified)  Coordination of Care Other  Education Method -  Time Spent with Patient 15

## 2019-10-22 NOTE — Telephone Encounter (Addendum)
[place] Work  Clinical Social Work was referred by medical oncology PA for assessment of psychosocial needs.  Clinical Social Worker attempted to contact patient by calling stepson/POA [name], to offer support and assess for needs.  CSW unable to reach [name] and unable to leave voicemail.  Will attempt to contact at a later time.  Update: spoke with patient's stepson, [name].  [name] shared complexities of navigating needs through multiple providers including [facility], [facility], oncology, and dialysis.  [name] reported patient lives at home and has support, but can benefit from additional support as available.  CSW discussed [facility] services and [facility].  Patient's son plans to follow up with CSW as needed.   Gwinda Maine, LCSW  Clinical Social Worker [facility]

## 2019-10-22 NOTE — Progress Notes (Signed)
Oncology Nurse Navigator Documentation  Oncology Nurse Navigator Flowsheets 10/22/2019  Abnormal Finding Date -  Confirmed Diagnosis Date -  Diagnosis Status -  Navigator Location [facility]  Referral Date to RadOnc/MedOnc -  Navigator Encounter Type Other/I followed up with the [facility] navigator today regarding referral to rad onc.  I was unable to reach navigator but did leave vm message with my name and phone number to call.   Telephone -  Treatment Initiated Date -  Treatment Phase -  Barriers/Navigation Needs Coordination of Care  Education -  Interventions Coordination of Care  Acuity Level 2-Minimal Needs (1-2 Barriers Identified)  Coordination of Care Other  Education Method -  Time Spent with Patient 15

## 2019-10-24 ENCOUNTER — Telehealth: Payer: Self-pay | Admitting: Internal Medicine

## 2019-10-24 ENCOUNTER — Ambulatory Visit (INDEPENDENT_AMBULATORY_CARE_PROVIDER_SITE_OTHER): Payer: Medicare HMO | Admitting: Licensed Clinical Social Worker

## 2019-10-24 DIAGNOSIS — J441 Chronic obstructive pulmonary disease with (acute) exacerbation: Secondary | ICD-10-CM

## 2019-10-24 DIAGNOSIS — I4891 Unspecified atrial fibrillation: Secondary | ICD-10-CM | POA: Diagnosis not present

## 2019-10-24 DIAGNOSIS — F339 Major depressive disorder, recurrent, unspecified: Secondary | ICD-10-CM | POA: Diagnosis not present

## 2019-10-24 DIAGNOSIS — E785 Hyperlipidemia, unspecified: Secondary | ICD-10-CM

## 2019-10-24 DIAGNOSIS — I10 Essential (primary) hypertension: Secondary | ICD-10-CM | POA: Diagnosis not present

## 2019-10-24 NOTE — Chronic Care Management (AMB) (Signed)
Care Management Note   Don [name] is a 78 y.o. year [name] male who is a primary care patient of Don [name], Don Kaufmann, MD. The CM team was consulted for assistance with chronic disease management and care coordination.   I reached out to Don [name], by phone today.   Review of patient status, including review of consultants reports, relevant laboratory and other test results, and collaboration with appropriate care team members and the patient's provider was performed as part of comprehensive patient evaluation and provision of chronic care management services.  Social determinants of health: risk of tobacco use; risk of depression    Chronic Care Management from 12/23/2018 in [facility]  PHQ-9 Total Score  9     GAD 7 : Generalized Anxiety Score 05/25/2016  Nervous, Anxious, on Edge 1  Control/stop worrying 2  Worry too much - different things 2  Trouble relaxing 1  Restless 0  Easily annoyed or irritable 1  Afraid - awful might happen 0  Total GAD 7 Score 7  Anxiety Difficulty Not difficult at all    Medications   (very important)  New medications from outside sources are available for reconciliation  acetaminophen (TYLENOL) 325 MG tablet albuterol (PROAIR HFA) 108 (90 Base) MCG/ACT inhaler atorvastatin (LIPITOR) 20 MG tablet bisoprolol (ZEBETA) 10 MG tablet budesonide-formoterol (SYMBICORT) 160-4.5 MCG/ACT inhaler calcitRIOL (ROCALTROL) 0.25 MCG capsule clonazePAM (KLONOPIN) 1 MG tablet cyclobenzaprine (FLEXERIL) 10 MG tablet diltiazem (CARDIZEM CD) 120 MG 24 hr capsule ferrous sulfate 325 (65 FE) MG tablet fluticasone (FLONASE) 50 MCG/ACT nasal spray hydroxypropyl methylcellulose / hypromellose (ISOPTO TEARS / GONIOVISC) 2.5 % ophthalmic solution iron polysaccharides (FERREX 150) 150 MG capsule lidocaine-prilocaine (EMLA) cream loratadine (CLARITIN) 10 MG tablet Melatonin 3 MG TABS Multiple Vitamin (MULTIVITAMIN WITH  MINERALS) TABS tablet Multiple Vitamins-Minerals (MENS ONE DAILY PO) Multiple Vitamins-Minerals (PRESERVISION AREDS 2 PO) Multiple Vitamins-Minerals (PRESERVISION AREDS 2+MULTI VIT) CAPS multivitamin (RENA-VIT) TABS tablet Nutritional Supplements (FEEDING SUPPLEMENT, NEPRO CARB STEADY,) LIQD omega-3 acid ethyl esters (LOVAZA) 1 g capsule Omega-3 Fatty Acids (FISH OIL) 1000 MG CAPS pantoprazole (PROTONIX) 40 MG tablet prochlorperazine (COMPAZINE) 10 MG tablet sevelamer carbonate (RENVELA) 800 MG tablet terbinafine (LAMISIL) 250 MG tablet tiotropium (SPIRIVA) 18 MCG inhalation capsule warfarin (COUMADIN) 2 MG tablet  Goals Addressed            This Visit's Progress   . "I get sad sometimes because I live alone" (pt-stated)       Current Barriers:  . Depression symptoms  of client with chronic diagnoses of Atrial Fibrillation, Depression, COPD, HTN, and Hyperlipidemia . Ambulation challenges . Transport challenges  Clinical Social Work Clinical Goal(s): Over the next 30 days, client will verbalize understanding of depression symptoms management.  Interventions: . Previously discussed with client self care activities: sleep hygiene, basic healthy eating practices . Encouraged client previously to participate in recreational/relaxation activities of choice  . Previously encouraged client or POA to call LCSW as needed to discuss management of depression symptoms of client . Talked with Don [name] POA about CCM program support . Talked with Don [name] POA about current client needs . Don [name] POA was appreciative of CCM program support for client and Don [name] POA requested that client now be removed from [facility] program support . LCSW to inform RNCM Don [name] that POA requested client now be removed form CCM program services  Patient Self Care Activities:  . Client socializes with family weekly as scheduled.  . Client  contacts Don [name], step-son, POA, as needed for daily  needs/assistance of client. . Client takes medications as prescribed.  Plan:  . Client to use relaxation techniques of choice to help him manage depression symptoms.  . Client to attend scheduled medical appointments . Client to practice self care activities (eating meals on normal scheduled times, getting enough rest at night)       Don [name].Don [name] MSW, LCSW Licensed Clinical Social Worker [name] Care Management [phone]

## 2019-10-24 NOTE — Telephone Encounter (Signed)
Rec'd call from patient's son [name] and he wanted to go ahead and schedule the Palliative Consult after speaking with [place] at the [place].  I have scheduled a Telephone Consult for 10/28/19 @ 9 AM

## 2019-10-24 NOTE — Patient Instructions (Addendum)
Licensed Clinical Social Worker Visit Information  Goals we discussed today:  Goals Addressed            This Visit's Progress   . "I get sad sometimes because I live alone" (pt-stated)       Current Barriers:  . Depression symptoms  of client with chronic diagnoses of Atrial Fibrillation, Depression, COPD, HTN, and Hyperlipidemia . Ambulation challenges . Transport challenges  Clinical Social Work Clinical Goal(s): Over the next 30 days, client will verbalize understanding of depression symptoms management.  Interventions: . Previously discussed with client self care activities: sleep hygiene, basic healthy eating practices . Encouraged client previously to participate in recreational/relaxation activities of choice  . Encouraged client previously to use relaxation techniques (watching TV,talking via phone with [name] [facility])  Talked with [name] POA about CCM program support  Talked with [name] POA about current client needs  [name] POA was appreciative of CCM program support for client and [name] POA requested that client now be removed from CCM program support  LCSW to inform RNCM [name] that POA requested client now be removed form CCM program services   Patient Self Care Activities:  . Client socializes with family weekly as scheduled.  . Client contacts [name], step-son, POA, as needed for daily needs/assistance of client. . Client takes medications as prescribed.  Plan:   . [name] Client to use relaxation techniques of choice to help him manage depression symptoms.  . Client to attend scheduled medical appointments . Client to practice self care activities (eating meals on normal scheduled times, getting enough rest at night)          Materials Provided: No  The patient /[name] Hassell Done POA,verbalized understanding of instructions provided today and declined a print copy of patient instruction materials.   [name].Dodge [place]or MSW,  LCSW Licensed Clinical Social Worker [facility] Care Management [phone]

## 2019-10-27 ENCOUNTER — Inpatient Hospital Stay: Payer: No Typology Code available for payment source

## 2019-10-27 ENCOUNTER — Inpatient Hospital Stay (HOSPITAL_BASED_OUTPATIENT_CLINIC_OR_DEPARTMENT_OTHER): Payer: No Typology Code available for payment source | Admitting: Medical

## 2019-10-27 ENCOUNTER — Inpatient Hospital Stay (HOSPITAL_BASED_OUTPATIENT_CLINIC_OR_DEPARTMENT_OTHER): Payer: No Typology Code available for payment source | Admitting: Physician Assistant

## 2019-10-27 ENCOUNTER — Encounter: Payer: Self-pay | Admitting: *Deleted

## 2019-10-27 ENCOUNTER — Encounter: Payer: Self-pay | Admitting: Physician Assistant

## 2019-10-27 ENCOUNTER — Other Ambulatory Visit: Payer: Self-pay

## 2019-10-27 VITALS — BP 121/73 | HR 99 | Temp 98.1°F | Resp 18

## 2019-10-27 VITALS — BP 91/51 | HR 66 | Temp 98.3°F | Resp 17 | Ht 69.0 in | Wt 152.0 lb

## 2019-10-27 DIAGNOSIS — Z5111 Encounter for antineoplastic chemotherapy: Secondary | ICD-10-CM

## 2019-10-27 DIAGNOSIS — D638 Anemia in other chronic diseases classified elsewhere: Secondary | ICD-10-CM | POA: Insufficient documentation

## 2019-10-27 DIAGNOSIS — Z992 Dependence on renal dialysis: Secondary | ICD-10-CM | POA: Insufficient documentation

## 2019-10-27 DIAGNOSIS — Z510 Encounter for antineoplastic radiation therapy: Secondary | ICD-10-CM | POA: Insufficient documentation

## 2019-10-27 DIAGNOSIS — N186 End stage renal disease: Secondary | ICD-10-CM | POA: Insufficient documentation

## 2019-10-27 DIAGNOSIS — C3492 Malignant neoplasm of unspecified part of left bronchus or lung: Secondary | ICD-10-CM | POA: Diagnosis not present

## 2019-10-27 DIAGNOSIS — R634 Abnormal weight loss: Secondary | ICD-10-CM

## 2019-10-27 DIAGNOSIS — C3432 Malignant neoplasm of lower lobe, left bronchus or lung: Secondary | ICD-10-CM | POA: Insufficient documentation

## 2019-10-27 DIAGNOSIS — T8090XA Unspecified complication following infusion and therapeutic injection, initial encounter: Secondary | ICD-10-CM

## 2019-10-27 LAB — CMP (CANCER CENTER ONLY)
ALT: 14 U/L (ref 0–44)
AST: 16 U/L (ref 15–41)
Albumin: 2.5 g/dL — ABNORMAL LOW (ref 3.5–5.0)
Alkaline Phosphatase: 154 U/L — ABNORMAL HIGH (ref 38–126)
Anion gap: 15 (ref 5–15)
BUN: 49 mg/dL — ABNORMAL HIGH (ref 8–23)
CO2: 30 mmol/L (ref 22–32)
Calcium: 9.1 mg/dL (ref 8.9–10.3)
Chloride: 98 mmol/L (ref 98–111)
Creatinine: 5.9 mg/dL (ref 0.61–1.24)
GFR, Est AFR Am: 10 mL/min — ABNORMAL LOW (ref >60)
GFR, Estimated: 8 mL/min — ABNORMAL LOW (ref >60)
Glucose, Bld: 119 mg/dL — ABNORMAL HIGH (ref 70–99)
Potassium: 4.3 mmol/L (ref 3.5–5.1)
Sodium: 143 mmol/L (ref 135–145)
Total Bilirubin: 0.5 mg/dL (ref 0.3–1.2)
Total Protein: 6.4 g/dL — ABNORMAL LOW (ref 6.5–8.1)

## 2019-10-27 LAB — CBC WITH DIFFERENTIAL (CANCER CENTER ONLY)
Abs Immature Granulocytes: 0.08 10*3/uL — ABNORMAL HIGH (ref 0.00–0.07)
Basophils Absolute: 0 10*3/uL (ref 0.0–0.1)
Basophils Relative: 0 %
Eosinophils Absolute: 0.2 10*3/uL (ref 0.0–0.5)
Eosinophils Relative: 2 %
HCT: 25.1 % — ABNORMAL LOW (ref 39.0–52.0)
Hemoglobin: 7.9 g/dL — ABNORMAL LOW (ref 13.0–17.0)
Immature Granulocytes: 1 %
Lymphocytes Relative: 14 %
Lymphs Abs: 1.3 10*3/uL (ref 0.7–4.0)
MCH: 32 pg (ref 26.0–34.0)
MCHC: 31.5 g/dL (ref 30.0–36.0)
MCV: 101.6 fL — ABNORMAL HIGH (ref 80.0–100.0)
Monocytes Absolute: 0.9 10*3/uL (ref 0.1–1.0)
Monocytes Relative: 10 %
Neutro Abs: 6.9 10*3/uL (ref 1.7–7.7)
Neutrophils Relative %: 73 %
Platelet Count: 256 10*3/uL (ref 150–400)
RBC: 2.47 MIL/uL — ABNORMAL LOW (ref 4.22–5.81)
RDW: 16.8 % — ABNORMAL HIGH (ref 11.5–15.5)
WBC Count: 9.4 10*3/uL (ref 4.0–10.5)
nRBC: 0 % (ref 0.0–0.2)

## 2019-10-27 LAB — SAMPLE TO BLOOD BANK

## 2019-10-27 MED ORDER — SODIUM CHLORIDE 0.9 % IV SOLN
100.0000 mg | Freq: Once | INTRAVENOUS | Status: AC
  Administered 2019-10-27: 19:00:00 100 mg via INTRAVENOUS
  Filled 2019-10-27: qty 10

## 2019-10-27 MED ORDER — DIPHENHYDRAMINE HCL 50 MG/ML IJ SOLN
INTRAMUSCULAR | Status: AC
  Filled 2019-10-27: qty 1

## 2019-10-27 MED ORDER — SODIUM CHLORIDE 0.9 % IV SOLN
Freq: Once | INTRAVENOUS | Status: AC
  Administered 2019-10-27: 15:00:00 via INTRAVENOUS
  Filled 2019-10-27: qty 250

## 2019-10-27 MED ORDER — PALONOSETRON HCL INJECTION 0.25 MG/5ML
0.2500 mg | Freq: Once | INTRAVENOUS | Status: AC
  Administered 2019-10-27: 0.25 mg via INTRAVENOUS

## 2019-10-27 MED ORDER — SODIUM CHLORIDE 0.9 % IV SOLN
20.0000 mg | Freq: Once | INTRAVENOUS | Status: AC
  Administered 2019-10-27: 16:00:00 20 mg via INTRAVENOUS
  Filled 2019-10-27: qty 20

## 2019-10-27 MED ORDER — SODIUM CHLORIDE 0.9 % IV SOLN
45.0000 mg/m2 | Freq: Once | INTRAVENOUS | Status: AC
  Administered 2019-10-27: 84 mg via INTRAVENOUS
  Filled 2019-10-27: qty 14

## 2019-10-27 MED ORDER — FAMOTIDINE IN NACL 20-0.9 MG/50ML-% IV SOLN
INTRAVENOUS | Status: AC
  Filled 2019-10-27: qty 50

## 2019-10-27 MED ORDER — FAMOTIDINE IN NACL 20-0.9 MG/50ML-% IV SOLN
20.0000 mg | Freq: Once | INTRAVENOUS | Status: AC
  Administered 2019-10-27: 15:00:00 20 mg via INTRAVENOUS

## 2019-10-27 MED ORDER — PALONOSETRON HCL INJECTION 0.25 MG/5ML
INTRAVENOUS | Status: AC
  Filled 2019-10-27: qty 5

## 2019-10-27 MED ORDER — DIPHENHYDRAMINE HCL 50 MG/ML IJ SOLN
50.0000 mg | Freq: Once | INTRAMUSCULAR | Status: AC
  Administered 2019-10-27: 15:00:00 50 mg via INTRAVENOUS

## 2019-10-27 MED ORDER — SODIUM CHLORIDE 0.9 % IV SOLN: 78.0000 mg | Freq: Once | INTRAVENOUS | Status: DC

## 2019-10-27 MED ORDER — METHYLPREDNISOLONE SODIUM SUCC 125 MG IJ SOLR
125.0000 mg | Freq: Once | INTRAMUSCULAR | Status: AC | PRN
  Administered 2019-10-27: 125 mg via INTRAVENOUS

## 2019-10-27 MED ORDER — FAMOTIDINE IN NACL 20-0.9 MG/50ML-% IV SOLN
20.0000 mg | Freq: Once | INTRAVENOUS | Status: AC | PRN
  Administered 2019-10-27: 17:00:00 20 mg via INTRAVENOUS

## 2019-10-27 MED ORDER — SODIUM CHLORIDE 0.9 % IV SOLN
Freq: Once | INTRAVENOUS | Status: DC | PRN
  Administered 2019-10-27: 17:00:00 via INTRAVENOUS
  Filled 2019-10-27: qty 250

## 2019-10-27 NOTE — Progress Notes (Signed)
Spoke w/ Dr. Julien Nordmann, he would like patient to get a flat carboplatin dosing of 100mg  (on HD, SCr inaccurate).   Demetrius Charity, PharmD, [place] Oncology Pharmacist Pharmacy Phone: [phone] 10/27/2019

## 2019-10-27 NOTE — Progress Notes (Signed)
Oncology Nurse Navigator Documentation  Oncology Nurse Navigator Flowsheets 10/27/2019  Abnormal Finding Date -  Confirmed Diagnosis Date -  Diagnosis Status -  Navigator Location [facility]  Referral Date to RadOnc/MedOnc -  Navigator Encounter Type Other/I received a message from rad onc that patient is scheduled with them today and was questioning why due to patients request to be seen with Rad Onc in [place].  I updated that I have been working with [facility] management [facility] to get this [facility].  Rad Onc scheduling stated that [facility] treatment is out of network and that is why he is scheduled here today.    Telephone -  Treatment Initiated Date -  Treatment Phase -  Barriers/Navigation Needs Coordination of Care  Education -  Interventions Coordination of Care  Acuity Level 2-Minimal Needs (1-2 Barriers Identified)  Coordination of Care Other  Education Method -  Time Spent with Patient 15

## 2019-10-27 NOTE — Progress Notes (Signed)
Ok to treat w/ hgb 7.9 and elevated creatinine per. C. Heilingoetter PA

## 2019-10-27 NOTE — Progress Notes (Signed)
1629 Pt reports facial itching and some small hives noted on his L cheek. Infusion paused. Shelia Media called to bedside. 9 NS to gravity. Solumedrol 125mg  IVP (see MAR). Some more small hives noted on R cheek on L back. Pepcid given (see MAR). VSS throughout. Taxol infusion resumed at 1704 at a rate of 66 ml/hr once hives resolved.

## 2019-10-27 NOTE — Progress Notes (Signed)
[facility] OFFICE PROGRESS NOTE  Dettinger, Fransisca Kaufmann, MD [name] [id number]  DIAGNOSIS: Stage IIb (T3, N0, M0) non-small cell lung cancer, squamous cell carcinoma diagnosed in November 2020 and presented with large cavitary left lower lobe lung mass with no significant hilar or mediastinal lymphadenopathy and no distant metastatic disease.  PRIOR THERAPY: None  CURRENT THERAPY: Weekly concurrent chemoradiation with carboplatin for an AUC of 2 and paclitaxel 45 mg/m.  First dose 10/27/2019. Radiation treatment yet to be determined.   INTERVAL HISTORY: MARKUS CASTEN 78 y.o. male returns to the clinic today for a follow up visit accompanied by his Richardine Service.  The patient is here today to start his first cycle of chemotherapy. He has a consult with radiation oncology tomorrow to further discuss initiating his concurrent radiation. The patient continues to experience weakness and fatigue. The patient is currently on hemodialysis on Tuesdays, Thursdays, and Saturdays.  He also has an upcoming colonoscopy to further assess for possible GI blood loss secondary to having dark stools.  He also is reporting a 6 lb weight loss since his appointment next week.  He states he tries to drink approximately 1-2 ensures per day.  He occasionally experiences nausea for which he takes his antiemetic.  Denies any recent fever, chills, or night sweats.  He denies any chest pain, unusual cough, or any hemoptysis.  He denies any vomiting, diarrhea, or constipation.  He denies any headache or visual changes.  He reports dyspnea on exertion.  He is here today for evaluation for starting his first cycle of chemotherapy.   MEDICAL HISTORY: Past Medical History:  Diagnosis Date  . Acute bronchitis 04/03/2014  . Anemia    low iron  . Anxiety   . Atrial fibrillation ([place])   . Cataract   . COPD (chronic obstructive pulmonary disease) ([place])   . Dementia ([place])   . Depression   . Dyspnea     with activity  . ESRD (end stage renal disease) ([place]) 04/03/2014   Stage 5 Dialysis on T/Th/Sa  . Essential hypertension   . Hyperlipidemia   . Macular degeneration   . Noncompliance with medications 04/2013   Xarelto, digoxin previously  . Polyposis coli   . Pre-diabetes   . Prostate cancer ([place])   . Tubular adenoma of colon 07/31/02, 11/18/03    ALLERGIES:  is allergic to wellbutrin [bupropion].  MEDICATIONS:  Current Outpatient Medications  Medication Sig Dispense Refill  . acetaminophen (TYLENOL) 325 MG tablet Take 325-650 mg by mouth every 6 (six) hours as needed for moderate pain.     [name] albuterol (PROAIR HFA) 108 (90 Base) MCG/ACT inhaler Inhale 2 puffs into the lungs 2 (two) times daily.     [name] atorvastatin (LIPITOR) 20 MG tablet Take 1 tablet (20 mg total) by mouth daily. (Needs to be seen) 90 tablet 0  . bisoprolol (ZEBETA) 10 MG tablet TAKE (1) TABLET TWICE A DAY. (Patient taking differently: Take 10 mg by mouth every morning. ) 180 tablet 1  . budesonide-formoterol (SYMBICORT) 160-4.5 MCG/ACT inhaler Inhale 2 puffs into the lungs 2 (two) times daily.    . calcitRIOL (ROCALTROL) 0.25 MCG capsule Take 1 capsule (0.25 mcg total) by mouth every morning. 90 capsule 3  . clonazePAM (KLONOPIN) 1 MG tablet TAKE 1/2 TABLET AT BEDTIME (Patient taking differently: Take 0.5 mg by mouth at bedtime. ) 15 tablet 1  . cyclobenzaprine (FLEXERIL) 10 MG tablet Take 1 tablet (10 mg total)  by mouth 3 (three) times daily as needed for muscle spasms. 30 tablet 0  . diltiazem (CARDIZEM CD) 120 MG 24 hr capsule Take 1 capsule (120 mg total) by mouth daily. 90 capsule 3  . ferrous sulfate 325 (65 FE) MG tablet Take 325 mg by mouth every Monday, Wednesday, and Friday.    . fluticasone (FLONASE) 50 MCG/ACT nasal spray Place 2 sprays into both nostrils daily. (Patient taking differently: Place 2 sprays into both nostrils daily as needed for allergies. ) 16 g 6  . hydroxypropyl methylcellulose /  hypromellose (ISOPTO TEARS / GONIOVISC) 2.5 % ophthalmic solution Place 1 drop into both eyes 2 (two) times daily as needed for dry eyes.     . iron polysaccharides (FERREX 150) 150 MG capsule Take 1 capsule (150 mg total) by mouth daily. 90 capsule 0  . lidocaine-prilocaine (EMLA) cream Apply 1 application topically as needed. 30 g 0  . loratadine (CLARITIN) 10 MG tablet TAKE 1 TABLET DAILY 30 tablet 11  . Melatonin 3 MG TABS Take 3 mg by mouth at bedtime.     . Multiple Vitamin (MULTIVITAMIN WITH MINERALS) TABS tablet Take 1 tablet by mouth at bedtime.     . Multiple Vitamins-Minerals (MENS ONE DAILY PO) Take 1 tablet by mouth daily.    . Multiple Vitamins-Minerals (PRESERVISION AREDS 2 PO) Take 1 tablet by mouth 2 (two) times daily.     . Multiple Vitamins-Minerals (PRESERVISION AREDS 2+MULTI VIT) CAPS Take 1 capsule by mouth 2 (two) times daily.    . multivitamin (RENA-VIT) TABS tablet Take 1 tablet by mouth at bedtime. 30 tablet 0  . Nutritional Supplements (FEEDING SUPPLEMENT, NEPRO CARB STEADY,) LIQD Take 237 mLs by mouth 2 (two) times daily between meals. 60 Can 0  . omega-3 acid ethyl esters (LOVAZA) 1 g capsule Take 2 g by mouth 2 (two) times daily.    . Omega-3 Fatty Acids (FISH OIL) 1000 MG CAPS Take 2 capsules by mouth 2 (two) times daily.    . pantoprazole (PROTONIX) 40 MG tablet TAKE (1) TABLET TWICE A DAY. 180 tablet 0  . prochlorperazine (COMPAZINE) 10 MG tablet Take 1 tablet (10 mg total) by mouth every 6 (six) hours as needed for nausea or vomiting. 30 tablet 2  . sevelamer carbonate (RENVELA) 800 MG tablet Take 800 mg by mouth 3 (three) times daily with meals.    . terbinafine (LAMISIL) 250 MG tablet Take 3 times weekly as directed on dialysis day after dialysis 36 tablet 0  . tiotropium (SPIRIVA) 18 MCG inhalation capsule Place 18 mcg into inhaler and inhale daily.     [name] warfarin (COUMADIN) 2 MG tablet TAKE 2 TABLETS (4MG ) DAILY EXCEPT ON MONDAY AND THURS TAKE 2 & 1/2 (5MG ) 70  tablet 0   No current facility-administered medications for this visit.   Facility-Administered Medications Ordered in Other Visits  Medication Dose Route Frequency Provider Last Rate Last Admin  . CARBOplatin (PARAPLATIN) 100 mg in sodium chloride 0.9 % 100 mL chemo infusion  100 mg Intravenous Once Curt Bears, MD      . dexamethasone (DECADRON) 20 mg in sodium chloride 0.9 % 50 mL IVPB  20 mg Intravenous Once Curt Bears, MD      . PACLitaxel (TAXOL) 84 mg in sodium chloride 0.9 % 250 mL chemo infusion (</= 80mg /m2)  45 mg/m2 (Treatment Plan Recorded) Intravenous Once Curt Bears, MD        SURGICAL HISTORY:  Past Surgical History:  Procedure Laterality  Date  . A/V FISTULAGRAM Left 05/30/2019   Procedure: A/V FISTULAGRAM;  Surgeon: Elam Dutch, MD;  Location: [facility];  Service: Cardiovascular;  Laterality: Left;  . Anal abcess,Hemorroids,    . AV FISTULA PLACEMENT Left 06/23/2019   Procedure: INSERTION OF ARTERIOVENOUS (AV) GORE-TEX GRAFT ARM;  Surgeon: Elam Dutch, MD;  Location: [facility];  Service: Vascular;  Laterality: Left;  . BASCILIC VEIN TRANSPOSITION Left 10/08/2018   Procedure: FIRST STAGE BASILIC VEIN TRANSPOSITION LEFT ARM;  Surgeon: Angelia Mould, MD;  Location: [facility];  Service: Vascular;  Laterality: Left;  . BASCILIC VEIN TRANSPOSITION Left 12/16/2018   Procedure: BASILIC VEIN TRANSPOSITION SECOND STAGE;  Surgeon: Angelia Mould, MD;  Location: [facility];  Service: Vascular;  Laterality: Left;  . COLONOSCOPY N/A 04/05/2014   Dr. Fuller Plan: 6 mm sessile polyp from sigmoid colon (tubular adenoma), internal hemorrhoids  . COLONOSCOPY W/ BIOPSIES  11/18/2003   Dr. Earle Gell  . ESOPHAGOGASTRODUODENOSCOPY  11/18/2003   Dr. Earle Gell  . ESOPHAGOGASTRODUODENOSCOPY N/A 04/05/2014   Dr. Fuller Plan: variable Z line, negative Barrett's, small hiatal hernia  . ESOPHAGOGASTRODUODENOSCOPY N/A 02/02/2017   Dr. Oneida Alar: LA Grade B esophagitis,  one moderate benign-appearing intrinsic stenosis traversed, small non-bleeding diverticulum in second portion of duodenum, reactive gastropathy, no H.pylori.  . ESOPHAGOGASTRODUODENOSCOPY N/A 03/22/2018   Procedure: ESOPHAGOGASTRODUODENOSCOPY (EGD);  Surgeon: Daneil Dolin, MD;  [facility];  Service: Endoscopy;  Laterality: N/A;  . GIVENS CAPSULE STUDY N/A 03/22/2018   Procedure: GIVENS CAPSULE STUDY;  Surgeon: Daneil Dolin, MD;  [facility];  Service: Endoscopy;  Laterality: N/A;  . IR FLUORO GUIDE CV LINE RIGHT  10/29/2018  . IR REMOVAL TUN CV CATH W/O FL  08/06/2019  . IR US GUIDE VASC ACCESS RIGHT  10/29/2018  . PERIPHERAL VASCULAR BALLOON ANGIOPLASTY Left 03/14/2019   Procedure: PERIPHERAL VASCULAR BALLOON ANGIOPLASTY;  Surgeon: Angelia Mould, MD;  Location: [facility];  Service: Cardiovascular;  Laterality: Left;  . PERIPHERAL VASCULAR BALLOON ANGIOPLASTY  05/30/2019   Procedure: PERIPHERAL VASCULAR BALLOON ANGIOPLASTY;  Surgeon: Elam Dutch, MD;  Location: [facility];  Service: Cardiovascular;;  left arm fistula  . RETROPUBIC PROSTATECTOMY  11/26/2001  . TONSILLECTOMY    . VIDEO BRONCHOSCOPY WITH ENDOBRONCHIAL NAVIGATION N/A 09/26/2019   Procedure: VIDEO BRONCHOSCOPY WITH ENDOBRONCHIAL NAVIGATION;  Surgeon: Garner Nash, DO;  Location: [facility];  Service: Thoracic;  Laterality: N/A;  . VIDEO BRONCHOSCOPY WITH ENDOBRONCHIAL ULTRASOUND N/A 09/26/2019   Procedure: VIDEO BRONCHOSCOPY WITH ENDOBRONCHIAL ULTRASOUND;  Surgeon: Garner Nash, DO;  Location: [facility];  Service: Thoracic;  Laterality: N/A;    REVIEW OF SYSTEMS:   Review of Systems  Constitutional: Positive for fatigue, generalized weakness, and weight loss. Negative for chills and fever.  HENT: Negative for mouth sores, nosebleeds, sore throat and trouble swallowing.   Eyes: Negative for eye problems and icterus.  Respiratory: Positive for dyspnea on exertion. Negative for  cough, hemoptysis, and wheezing. Cardiovascular: Negative for chest pain and leg swelling.  Gastrointestinal: Negative for abdominal pain, constipation, diarrhea, nausea and vomiting.  Genitourinary: Negative for bladder incontinence, difficulty urinating, dysuria, frequency and hematuria.   Musculoskeletal: Negative for back pain, gait problem, neck pain and neck stiffness.  Skin: Negative for itching and rash.  Neurological: Negative for dizziness, extremity weakness, gait problem, headaches, light-headedness and seizures.  Hematological: Negative for adenopathy. Does not bruise/bleed easily.  Psychiatric/Behavioral: Negative for confusion, depression and sleep disturbance.  The patient is not nervous/anxious.     PHYSICAL EXAMINATION:  Blood pressure (!) 91/51, pulse 66, temperature 98.3 F (36.8 C), temperature source Temporal, resp. rate 17, height 5\' 9"  (1.753 m), weight 152 lb (68.9 kg), SpO2 95 %.  ECOG PERFORMANCE STATUS: 2 - Symptomatic, <50% confined to bed  Physical Exam  Constitutional: Oriented to person, place, and time and chronically ill appearing male and in no distress. HENT:  Head: Normocephalic and atraumatic.  Mouth/Throat: Oropharynx is clear and moist. No oropharyngeal exudate.  Eyes: Conjunctivae are normal. Right eye exhibits no discharge. Left eye exhibits no discharge. No scleral icterus.  Neck: Normal range of motion. Neck supple.  Cardiovascular: Normal rate, irregular rhythm, normal heart sounds and intact distal pulses.   Pulmonary/Chest: Effort normal and breath sounds normal. No respiratory distress. No wheezes. No rales.  Abdominal: Soft. Bowel sounds are normal. Exhibits no distension and no mass. There is no tenderness.  Musculoskeletal: Normal range of motion. Exhibits no edema.  Lymphadenopathy:    No cervical adenopathy.  Neurological: Alert and oriented to person, place, and time. Exhibits normal muscle tone. Gait normal. Coordination normal.   Skin: Skin is warm and dry. No rash noted. Not diaphoretic. No erythema. No pallor.  Psychiatric: Mood, memory and judgment normal.  Vitals reviewed.  LABORATORY DATA: Lab Results  Component Value Date   WBC 9.4 10/27/2019   HGB 7.9 (L) 10/27/2019   HCT 25.1 (L) 10/27/2019   MCV 101.6 (H) 10/27/2019   PLT 256 10/27/2019      Chemistry      Component Value Date/Time   NA 143 10/27/2019 1325   NA 143 02/27/2019 1120   K 4.3 10/27/2019 1325   CL 98 10/27/2019 1325   CO2 30 10/27/2019 1325   BUN 49 (H) 10/27/2019 1325   BUN 59 (H) 02/27/2019 1120   CREATININE 5.90 (HH) 10/27/2019 1325   CREATININE 1.41 (H) 05/15/2013 1150      Component Value Date/Time   CALCIUM 9.1 10/27/2019 1325   ALKPHOS 154 (H) 10/27/2019 1325   AST 16 10/27/2019 1325   ALT 14 10/27/2019 1325   BILITOT 0.5 10/27/2019 1325       RADIOGRAPHIC STUDIES:  MR BRAIN WO CONTRAST  Result Date: 10/15/2019 CLINICAL DATA:  Lung cancer staging EXAM: MRI HEAD WITHOUT CONTRAST TECHNIQUE: Multiplanar, multiecho pulse sequences of the brain and surrounding structures were obtained without intravenous contrast. COMPARISON:  MRI head 02/18/2018 FINDINGS: Brain: Intravenous contrast not administered due to GFR 12. Moderate atrophy. Multiple white matter hyperintensities throughout the cerebral hemispheres bilaterally are similar to the prior study. No vasogenic edema. Brainstem and cerebellum intact. Negative for hemorrhage. Negative for metastatic disease on unenhanced imaging. Vascular: Normal arterial flow voids. Skull and upper cervical spine: Negative Sinuses/Orbits: Mild mucosal edema paranasal sinuses. No orbital mass. Bilateral mastoid effusion. Left cataract surgery Other: None IMPRESSION: Atrophy and chronic microvascular ischemia. No acute infarct. No evidence of metastatic disease on unenhanced imaging. Small lesions could be missed without intravenous contrast. Electronically Signed   By: Franchot Gallo M.D.    On: 10/15/2019 15:26   NM PET - Initial Skull Base To Thigh  Result Date: 09/29/2019 CLINICAL DATA:  Initial treatment strategy for left lung mass. EXAM: NUCLEAR MEDICINE PET SKULL BASE TO THIGH TECHNIQUE: 7.66 mCi F-18 FDG was injected intravenously. Full-ring PET imaging was performed from the skull base to thigh after the radiotracer. CT data was obtained and used for attenuation correction and anatomic localization. Fasting blood  glucose: 93 mg/dl COMPARISON:  Chest CT 09/10/2019 FINDINGS: Mediastinal blood pool activity: SUV max 2.97 Liver activity: SUV max NA NECK: No hypermetabolic lymph nodes in the neck. Incidental CT findings: None. CHEST: 7 cm necrotic left lower lobe lung mass is markedly hypermetabolic with SUV max of 46.96. Central necrosis is noted. There is a small left-sided adjacent pleural effusion but I do not see any hypermetabolism. Small scattered mediastinal and hilar lymph nodes are stable. No enlarged or hypermetabolic mediastinal or hilar lymph nodes. No other pulmonary nodules are identified. Mild underlying emphysematous changes are noted. Incidental CT findings: Aortic and coronary artery calcifications. ABDOMEN/PELVIS: There are bilateral low-attenuation lesion adrenal glands consistent with benign adenomas. No abnormal FDG uptake. No evidence of hepatic metastatic disease. No abdominal/pelvic lymphadenopathy the. Incidental CT findings: Cholelithiasis. Moderate to advanced atherosclerotic calcifications involving the aorta and iliac arteries but no aneurysm. SKELETON: No suspicious bone lesions. Incidental CT findings: none IMPRESSION: 1. 7 cm centrally necrotic left lower lobe mass is hypermetabolic and consistent with primary lung neoplasm. 2. No enlarged or hypermetabolic mediastinal or hilar lymph nodes and no evidence of abdominal/pelvic metastatic disease or osseous metastatic disease. 3. Left-sided pleural effusion without pleural nodules or significant FDG uptake.  Electronically Signed   By: Marijo Sanes M.D.   On: 09/29/2019 14:59     ASSESSMENT/PLAN:  This is a very pleasant 78 year old Caucasian male recently diagnosed with stage IIb (T3, N0, M0) non-small cell lung cancer, squamous cell carcinoma.  He presented with a large cavitary left lower lobe lung mass with no significant hilar or mediastinal lymphadenopathy.  And no distant metastasis.  He was diagnosed in November 2020.  He is currently planing to undergo concurrent chemoradiation with carboplatin for an AUC of 2 and paclitaxel 45 mg/m.  He is scheduled to receive his first cycle of treatment today. He has an appointment with radiation oncology tomorrow.   The patient was seen with Dr. Julien Nordmann today. Labs were reviewed which shows persisitent anemia. Dr. Julien Nordmann recommends that the patient proceed with cycle #1 today as scheduled.   We will see him for a follow up visit in 2 weeks for evaluation before starting cycle #3.   I will add a sample to blood bank to be collected with his routine lab next week and will arrange for a blood transfusion if necessary.  It is cumbersome for the patient to transport to the cancer center for his appointments, especially given his dialysis 3 days a week. I encouraged the patient to keep his appointment for his upcoming colonoscopy.   I have placed a referral to the cancer center nutritionist team to further evaluate the patient and his weight loss.   The patient was advised to call immediately if she has any concerning symptoms in the interval. The patient voices understanding of current disease status and treatment options and is in agreement with the current care plan. All questions were answered. The patient knows to call the clinic with any problems, questions or concerns. We can certainly see the patient much sooner if necessary    Orders Placed This Encounter  Procedures  . Sample to Blood Bank    Standing Status:   Future    Standing Expiration  Date:   10/26/2020     Tobe Sos Caleb Prigmore, PA-C 10/27/19  ADDENDUM: Hematology/Oncology Attending: I had a face-to-face encounter with the patient today.  I recommended his care plan.  This is a very pleasant 78 years old white male with unresectable  stage IIb non-small cell lung cancer, squamous cell carcinoma presented with large cavitary left lower lobe lung mass.  The patient is here today to start the first cycle of his concurrent chemoradiation with weekly carboplatin and paclitaxel.  He is scheduled to see Dr. Lisbeth Renshaw tomorrow for discussion and recommendation regarding the radiotherapy option. I recommended for the patient to proceed with his treatment today as planned. We will see him back for follow-up visit in 2 weeks for reevaluation and management of any adverse effect of his treatment. For the end-stage renal disease, he will continue his hemodialysis in [place]. For the anemia we will continue to monitor it for now and consider the patient for transfusion if becomes symptomatic or less than 7.0 G/DL The patient was advised to call immediately if he has any concerning symptoms in the interval.  Disclaimer: This note was dictated with voice recognition software. Similar sounding words can inadvertently be transcribed and may be missed upon review. Eilleen Kempf, MD 10/27/19

## 2019-10-27 NOTE — Patient Instructions (Signed)
Paclitaxel injection What is this medicine? PACLITAXEL (PAK li TAX el) is a chemotherapy drug. It targets fast dividing cells, like cancer cells, and causes these cells to die. This medicine is used to treat ovarian cancer, breast cancer, lung cancer, Kaposi's sarcoma, and other cancers. This medicine may be used for other purposes; ask your health care provider or pharmacist if you have questions. COMMON BRAND NAME(S): Onxol, Taxol What should I tell my health care provider before I take this medicine? They need to know if you have any of these conditions:  history of irregular heartbeat  liver disease  low blood counts, like low white cell, platelet, or red cell counts  lung or breathing disease, like asthma  tingling of the fingers or toes, or other nerve disorder  an unusual or allergic reaction to paclitaxel, alcohol, polyoxyethylated castor oil, other chemotherapy, other medicines, foods, dyes, or preservatives  pregnant or trying to get pregnant  breast-feeding How should I use this medicine? This drug is given as an infusion into a vein. It is administered in a hospital or clinic by a specially trained health care professional. Talk to your pediatrician regarding the use of this medicine in children. Special care may be needed. Overdosage: If you think you have taken too much of this medicine contact a poison control center or emergency room at once. NOTE: This medicine is only for you. Do not share this medicine with others. What if I miss a dose? It is important not to miss your dose. Call your doctor or health care professional if you are unable to keep an appointment. What may interact with this medicine? Do not take this medicine with any of the following medications:  disulfiram  metronidazole This medicine may also interact with the following medications:  antiviral medicines for hepatitis, HIV or AIDS  certain antibiotics like erythromycin and  clarithromycin  certain medicines for fungal infections like ketoconazole and itraconazole  certain medicines for seizures like carbamazepine, phenobarbital, phenytoin  gemfibrozil  nefazodone  rifampin  St. John's wort This list may not describe all possible interactions. Give your health care provider a list of all the medicines, herbs, non-prescription drugs, or dietary supplements you use. Also tell them if you smoke, drink alcohol, or use illegal drugs. Some items may interact with your medicine. What should I watch for while using this medicine? Your condition will be monitored carefully while you are receiving this medicine. You will need important blood work done while you are taking this medicine. This medicine can cause serious allergic reactions. To reduce your risk you will need to take other medicine(s) before treatment with this medicine. If you experience allergic reactions like skin rash, itching or hives, swelling of the face, lips, or tongue, tell your doctor or health care professional right away. In some cases, you may be given additional medicines to help with side effects. Follow all directions for their use. This drug may make you feel generally unwell. This is not uncommon, as chemotherapy can affect healthy cells as well as cancer cells. Report any side effects. Continue your course of treatment even though you feel ill unless your doctor tells you to stop. Call your doctor or health care professional for advice if you get a fever, chills or sore throat, or other symptoms of a cold or flu. Do not treat yourself. This drug decreases your body's ability to fight infections. Try to avoid being around people who are sick. This medicine may increase your risk to bruise  or bleed. Call your doctor or health care professional if you notice any unusual bleeding. Be careful brushing and flossing your teeth or using a toothpick because you may get an infection or bleed more easily.  If you have any dental work done, tell your dentist you are receiving this medicine. Avoid taking products that contain aspirin, acetaminophen, ibuprofen, naproxen, or ketoprofen unless instructed by your doctor. These medicines may hide a fever. Do not become pregnant while taking this medicine. Women should inform their doctor if they wish to become pregnant or think they might be pregnant. There is a potential for serious side effects to an unborn child. Talk to your health care professional or pharmacist for more information. Do not breast-feed an infant while taking this medicine. Men are advised not to father a child while receiving this medicine. This product may contain alcohol. Ask your pharmacist or healthcare provider if this medicine contains alcohol. Be sure to tell all healthcare providers you are taking this medicine. Certain medicines, like metronidazole and disulfiram, can cause an unpleasant reaction when taken with alcohol. The reaction includes flushing, headache, nausea, vomiting, sweating, and increased thirst. The reaction can last from 30 minutes to several hours. What side effects may I notice from receiving this medicine? Side effects that you should report to your doctor or health care professional as soon as possible:  allergic reactions like skin rash, itching or hives, swelling of the face, lips, or tongue  breathing problems  changes in vision  fast, irregular heartbeat  high or low blood pressure  mouth sores  pain, tingling, numbness in the hands or feet  signs of decreased platelets or bleeding - bruising, pinpoint red spots on the skin, black, tarry stools, blood in the urine  signs of decreased red blood cells - unusually weak or tired, feeling faint or lightheaded, falls  signs of infection - fever or chills, cough, sore throat, pain or difficulty passing urine  signs and symptoms of liver injury like dark yellow or brown urine; general ill feeling or  flu-like symptoms; light-colored stools; loss of appetite; nausea; right upper belly pain; unusually weak or tired; yellowing of the eyes or skin  swelling of the ankles, feet, hands  unusually slow heartbeat Side effects that usually do not require medical attention (report to your doctor or health care professional if they continue or are bothersome):  diarrhea  hair loss  loss of appetite  muscle or joint pain  nausea, vomiting  pain, redness, or irritation at site where injected  tiredness This list may not describe all possible side effects. Call your doctor for medical advice about side effects. You may report side effects to FDA at 1-800-FDA-1088. Where should I keep my medicine? This drug is given in a hospital or clinic and will not be stored at home. NOTE: This sheet is a summary. It may not cover all possible information. If you have questions about this medicine, talk to your doctor, pharmacist, or health care provider.  2020 Elsevier/Gold Standard (2017-07-03 13:14:55)   Carboplatin injection What is this medicine? CARBOPLATIN (KAR boe pla tin) is a chemotherapy drug. It targets fast dividing cells, like cancer cells, and causes these cells to die. This medicine is used to treat ovarian cancer and many other cancers. This medicine may be used for other purposes; ask your health care provider or pharmacist if you have questions. COMMON BRAND NAME(S): Paraplatin What should I tell my health care provider before I take this medicine? They  need to know if you have any of these conditions:  blood disorders  hearing problems  kidney disease  recent or ongoing radiation therapy  an unusual or allergic reaction to carboplatin, cisplatin, other chemotherapy, other medicines, foods, dyes, or preservatives  pregnant or trying to get pregnant  breast-feeding How should I use this medicine? This drug is usually given as an infusion into a vein. It is administered in  a hospital or clinic by a specially trained health care professional. Talk to your pediatrician regarding the use of this medicine in children. Special care may be needed. Overdosage: If you think you have taken too much of this medicine contact a poison control center or emergency room at once. NOTE: This medicine is only for you. Do not share this medicine with others. What if I miss a dose? It is important not to miss a dose. Call your doctor or health care professional if you are unable to keep an appointment. What may interact with this medicine?  medicines for seizures  medicines to increase blood counts like filgrastim, pegfilgrastim, sargramostim  some antibiotics like amikacin, gentamicin, neomycin, streptomycin, tobramycin  vaccines Talk to your doctor or health care professional before taking any of these medicines:  acetaminophen  aspirin  ibuprofen  ketoprofen  naproxen This list may not describe all possible interactions. Give your health care provider a list of all the medicines, herbs, non-prescription drugs, or dietary supplements you use. Also tell them if you smoke, drink alcohol, or use illegal drugs. Some items may interact with your medicine. What should I watch for while using this medicine? Your condition will be monitored carefully while you are receiving this medicine. You will need important blood work done while you are taking this medicine. This drug may make you feel generally unwell. This is not uncommon, as chemotherapy can affect healthy cells as well as cancer cells. Report any side effects. Continue your course of treatment even though you feel ill unless your doctor tells you to stop. In some cases, you may be given additional medicines to help with side effects. Follow all directions for their use. Call your doctor or health care professional for advice if you get a fever, chills or sore throat, or other symptoms of a cold or flu. Do not treat  yourself. This drug decreases your body's ability to fight infections. Try to avoid being around people who are sick. This medicine may increase your risk to bruise or bleed. Call your doctor or health care professional if you notice any unusual bleeding. Be careful brushing and flossing your teeth or using a toothpick because you may get an infection or bleed more easily. If you have any dental work done, tell your dentist you are receiving this medicine. Avoid taking products that contain aspirin, acetaminophen, ibuprofen, naproxen, or ketoprofen unless instructed by your doctor. These medicines may hide a fever. Do not become pregnant while taking this medicine. Women should inform their doctor if they wish to become pregnant or think they might be pregnant. There is a potential for serious side effects to an unborn child. Talk to your health care professional or pharmacist for more information. Do not breast-feed an infant while taking this medicine. What side effects may I notice from receiving this medicine? Side effects that you should report to your doctor or health care professional as soon as possible:  allergic reactions like skin rash, itching or hives, swelling of the face, lips, or tongue  signs of infection -  fever or chills, cough, sore throat, pain or difficulty passing urine  signs of decreased platelets or bleeding - bruising, pinpoint red spots on the skin, black, tarry stools, nosebleeds  signs of decreased red blood cells - unusually weak or tired, fainting spells, lightheadedness  breathing problems  changes in hearing  changes in vision  chest pain  high blood pressure  low blood counts - This drug may decrease the number of white blood cells, red blood cells and platelets. You may be at increased risk for infections and bleeding.  nausea and vomiting  pain, swelling, redness or irritation at the injection site  pain, tingling, numbness in the hands or  feet  problems with balance, talking, walking  trouble passing urine or change in the amount of urine Side effects that usually do not require medical attention (report to your doctor or health care professional if they continue or are bothersome):  hair loss  loss of appetite  metallic taste in the mouth or changes in taste This list may not describe all possible side effects. Call your doctor for medical advice about side effects. You may report side effects to FDA at 1-800-FDA-1088. Where should I keep my medicine? This drug is given in a hospital or clinic and will not be stored at home. NOTE: This sheet is a summary. It may not cover all possible information. If you have questions about this medicine, talk to your doctor, pharmacist, or health care provider.  2020 Elsevier/Gold Standard (2008-02-04 14:38:05)

## 2019-10-27 NOTE — Progress Notes (Signed)
Cassie Heilingoepter, PA-C notified of Hg 7.9.
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
## 2019-10-28 ENCOUNTER — Ambulatory Visit: Payer: Medicare HMO

## 2019-10-28 ENCOUNTER — Telehealth: Payer: Self-pay | Admitting: *Deleted

## 2019-10-28 ENCOUNTER — Other Ambulatory Visit: Payer: Medicare HMO | Admitting: Internal Medicine

## 2019-10-28 ENCOUNTER — Other Ambulatory Visit: Payer: Medicare HMO

## 2019-10-28 ENCOUNTER — Institutional Professional Consult (permissible substitution): Payer: Medicare HMO | Admitting: Radiation Oncology

## 2019-10-28 ENCOUNTER — Encounter: Payer: Self-pay | Admitting: Internal Medicine

## 2019-10-28 ENCOUNTER — Telehealth: Payer: Self-pay | Admitting: Physician Assistant

## 2019-10-28 DIAGNOSIS — Z515 Encounter for palliative care: Secondary | ICD-10-CM

## 2019-10-28 DIAGNOSIS — R410 Disorientation, unspecified: Secondary | ICD-10-CM | POA: Diagnosis not present

## 2019-10-28 NOTE — Progress Notes (Signed)
    DATE:  10/27/2019                                          X  CHEMO/IMMUNOTHERAPY REACTION            MD:  Dr. Fanny Bien. Mohamed   AGENT/BLOOD PRODUCT RECEIVING TODAY:                  concurrent chemoradiation with carboplatin and paclitaxel  (cycle #1)   AGENT/BLOOD PRODUCT RECEIVING IMMEDIATELY PRIOR TO REACTION:       paclitaxel    VS: BP:     96/65    P:       101       SPO2:       99     room air           BP:     101/55   P:       93        SPO2:      98     room air   BP:     110/64   P:       102       SPO2:       98    room air   REACTION(S):           Itching, facial hives, and hot flashes   PREMEDS:      Aloxi, Benadryl 50 mg, dexamethasone, and Pepcid 20 mg   INTERVENTION: Paclitaxel was paused and the patient was given solumedrol 125 mg IV x 1, and Pepcid 20 mg IV x 1   Review of Systems  Review of Systems  Constitutional: Negative for chills, diaphoresis and fever.       Hot flashes  HENT: Negative for trouble swallowing and voice change.   Respiratory: Negative for cough, chest tightness, shortness of breath and wheezing.   Cardiovascular: Negative for chest pain and palpitations.  Gastrointestinal: Negative for abdominal pain, constipation, diarrhea, nausea and vomiting.  Musculoskeletal: Negative for back pain and myalgias.  Skin: Positive for rash.       itching  Neurological: Negative for dizziness, light-headedness and headaches.     Physical Exam  Physical Exam Constitutional:      General: He is not in acute distress.    Appearance: He is not diaphoretic.  HENT:     Head: Normocephalic and atraumatic.  Cardiovascular:     Rate and Rhythm: Normal rate and regular rhythm.     Heart sounds: Normal heart sounds. No murmur. No friction rub. No gallop.   Pulmonary:     Effort: Pulmonary effort is normal. No respiratory distress.     Breath sounds: Normal breath sounds. No wheezing or rales.  Skin:    General: Skin is warm and dry.     Findings:  Rash (Hives over the bilateral cheeks) present. No erythema.  Neurological:     Mental Status: He is alert.     OUTCOME:                [name] symptoms resolved after he was given solumedrol 125 mg IV x 1, and Pepcid 20 mg IV x 1. He was able to complete his chemotherapy without any additional concerns.   Sandi Mealy, MHS, PA-C

## 2019-10-28 NOTE — Telephone Encounter (Signed)
Scheduled per los. Called and left msg. Mailed printout  °

## 2019-10-28 NOTE — Telephone Encounter (Signed)
-----   Message from Manuella Ghazi, RN sent at 10/27/2019  6:48 PM EST ----- Regarding: Mohamed-first time folow up Please follow up with patient. Received taxol and carbo for first time 12/14. Patient had reaction to taxol
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
## 2019-10-29 ENCOUNTER — Telehealth: Payer: Self-pay

## 2019-10-29 ENCOUNTER — Telehealth: Payer: Self-pay | Admitting: Internal Medicine

## 2019-10-29 ENCOUNTER — Encounter: Payer: Self-pay | Admitting: Radiation Oncology

## 2019-10-29 ENCOUNTER — Ambulatory Visit
Admission: RE | Admit: 2019-10-29 | Discharge: 2019-10-29 | Disposition: A | Discharge status: Home / Self Care | Payer: Medicare HMO | Source: Ambulatory Visit | Attending: Internal Medicine | Admitting: Internal Medicine

## 2019-10-29 ENCOUNTER — Ambulatory Visit
Admission: RE | Admit: 2019-10-29 | Discharge: 2019-10-29 | Disposition: A | Discharge status: Home / Self Care | Payer: Medicare HMO | Source: Ambulatory Visit | Attending: Radiation Oncology | Admitting: Radiation Oncology

## 2019-10-29 ENCOUNTER — Other Ambulatory Visit: Payer: Self-pay

## 2019-10-29 VITALS — Ht 69.0 in | Wt 152.0 lb

## 2019-10-29 DIAGNOSIS — F0390 Unspecified dementia without behavioral disturbance: Secondary | ICD-10-CM | POA: Diagnosis not present

## 2019-10-29 DIAGNOSIS — R079 Chest pain, unspecified: Secondary | ICD-10-CM | POA: Diagnosis not present

## 2019-10-29 DIAGNOSIS — C3492 Malignant neoplasm of unspecified part of left bronchus or lung: Secondary | ICD-10-CM

## 2019-10-29 DIAGNOSIS — Z992 Dependence on renal dialysis: Secondary | ICD-10-CM | POA: Diagnosis not present

## 2019-10-29 DIAGNOSIS — N186 End stage renal disease: Secondary | ICD-10-CM | POA: Diagnosis not present

## 2019-10-29 DIAGNOSIS — G8929 Other chronic pain: Secondary | ICD-10-CM | POA: Diagnosis not present

## 2019-10-29 DIAGNOSIS — I4891 Unspecified atrial fibrillation: Secondary | ICD-10-CM | POA: Diagnosis not present

## 2019-10-29 DIAGNOSIS — Z801 Family history of malignant neoplasm of trachea, bronchus and lung: Secondary | ICD-10-CM | POA: Diagnosis not present

## 2019-10-29 DIAGNOSIS — J449 Chronic obstructive pulmonary disease, unspecified: Secondary | ICD-10-CM | POA: Diagnosis not present

## 2019-10-29 DIAGNOSIS — C3432 Malignant neoplasm of lower lobe, left bronchus or lung: Secondary | ICD-10-CM

## 2019-10-29 DIAGNOSIS — H3530 Unspecified macular degeneration: Secondary | ICD-10-CM | POA: Diagnosis not present

## 2019-10-29 DIAGNOSIS — Z87891 Personal history of nicotine dependence: Secondary | ICD-10-CM | POA: Diagnosis not present

## 2019-10-29 DIAGNOSIS — I5033 Acute on chronic diastolic (congestive) heart failure: Secondary | ICD-10-CM | POA: Diagnosis not present

## 2019-10-29 DIAGNOSIS — I132 Hypertensive heart and chronic kidney disease with heart failure and with stage 5 chronic kidney disease, or end stage renal disease: Secondary | ICD-10-CM | POA: Diagnosis not present

## 2019-10-29 HISTORY — DX: Malignant neoplasm of unspecified part of unspecified bronchus or lung: C34.90

## 2019-10-29 MED ORDER — CEPHALEXIN 500 MG PO CAPS: ORAL_CAPSULE | ORAL | 0 refills | Status: AC

## 2019-10-29 NOTE — Progress Notes (Signed)
Radiation Oncology         [phone] ________________________________  Initial Outpatient Consultation - Conducted via telephone due to current COVID-19 concerns for limiting patient exposure  I spoke with the patient to conduct this consult visit via telephone to spare the patient unnecessary potential exposure in the healthcare setting during the current COVID-19 pandemic. [name] HCPOA was notified in advance and was offered a [place] meeting to allow for face to face communication but unfortunately reported that they did not have the appropriate resources/technology to support such a visit and instead preferred to proceed with a telephone consult.  ________________________________  Name: Don [name]        MRN: [id number]  Date of Service: 10/29/2019 DOB: 11-Oct-1941  [id number], Fransisca Kaufmann, MD  Curt Bears, MD     REFERRING PHYSICIAN: Curt Bears, MD   DIAGNOSIS: The primary encounter diagnosis was Primary malignant neoplasm of bronchus of left lower lobe ([place]). 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   HISTORY OF PRESENT ILLNESS: JANARD [name] is a 78 y.o. male seen at the request of Dr. Julien Nordmann for a recently diagnosed lung cancer. The patient was found during a hospital admission to have a mass in the LLL measuring 6.5 x 5.3 x 7 cm in the LLL, and associated blunting of the costophrenic angle and associated infiltrate/atelectasis at the left base. CT on 09/10/2019 revealed a 6.8 x 5.2 cm LLL density  without adenopathy. He was seen by Dr. Valeta Harms and underwent a bronchoscopy on 09/26/2019 which revealed a squamous cell carcinoma. PET imaging on 09/29/2019 revealed the 7 cm LLL mass with an SUV of 76.73. no hypermetabolic nodes were identified and he has benign appearing adrenal adenomas bilaterally.  He also had an MRI of the brain on 10/15/2019 without contrast which was negative for disease but limited by the  lack of contrast.  He is a kidney patient was not given contrast for this reason.  He was originally going to go to [facility] for radiotherapy given the proximity to where he lives in his complicated dialysis schedule however the [place] did not authorize another community referral and he has met with Dr. Julien Nordmann and started on weekly Taxol and carboplatin and his healthcare POA was contacted today by phone to discuss chemo radiation with the radiation being administered here in [place].   PREVIOUS RADIATION THERAPY: Yes    PAST MEDICAL HISTORY:  Past Medical History:  Diagnosis Date  . Acute bronchitis 04/03/2014  . Anemia    low iron  . Anxiety   . Atrial fibrillation ([place])   . Cataract   . COPD (chronic obstructive pulmonary disease) ([place])   . Dementia ([place])   . Depression   . Dyspnea    with activity  . ESRD (end stage renal disease) ([place]) 04/03/2014   Stage 5 Dialysis on T/Th/Sa  . Essential hypertension   . Hyperlipidemia   . Lung cancer ([place])   . Macular degeneration   . Noncompliance with medications 04/2013   Xarelto, digoxin previously  . Polyposis coli   . Pre-diabetes   . Prostate cancer ([place])   . Tubular adenoma of colon 07/31/02, 11/18/03       PAST SURGICAL HISTORY: Past Surgical History:  Procedure Laterality Date  . A/V FISTULAGRAM Left 05/30/2019   Procedure: A/V FISTULAGRAM;  Surgeon: Elam Dutch, MD;  Location: [facility];  Service: Cardiovascular;  Laterality: Left;  .  Anal abcess,Hemorroids,    . AV FISTULA PLACEMENT Left 06/23/2019   Procedure: INSERTION OF ARTERIOVENOUS (AV) GORE-TEX GRAFT ARM;  Surgeon: Elam Dutch, MD;  Location: [facility];  Service: Vascular;  Laterality: Left;  . BASCILIC VEIN TRANSPOSITION Left 10/08/2018   Procedure: FIRST STAGE BASILIC VEIN TRANSPOSITION LEFT ARM;  Surgeon: Angelia Mould, MD;  Location: [facility];  Service: Vascular;  Laterality: Left;  . BASCILIC VEIN TRANSPOSITION Left 12/16/2018   Procedure:  BASILIC VEIN TRANSPOSITION SECOND STAGE;  Surgeon: Angelia Mould, MD;  Location: [facility] OR;  Service: Vascular;  Laterality: Left;  . COLONOSCOPY N/A 04/05/2014   Dr. Fuller Plan: 6 mm sessile polyp from sigmoid colon (tubular adenoma), internal hemorrhoids  . COLONOSCOPY W/ BIOPSIES  11/18/2003   Dr. Earle Gell  . ESOPHAGOGASTRODUODENOSCOPY  11/18/2003   Dr. Earle Gell  . ESOPHAGOGASTRODUODENOSCOPY N/A 04/05/2014   Dr. Fuller Plan: variable Z line, negative Barrett's, small hiatal hernia  . ESOPHAGOGASTRODUODENOSCOPY N/A 02/02/2017   Dr. Oneida Alar: LA Grade B esophagitis, one moderate benign-appearing intrinsic stenosis traversed, small non-bleeding diverticulum in second portion of duodenum, reactive gastropathy, no H.pylori.  . ESOPHAGOGASTRODUODENOSCOPY N/A 03/22/2018   Procedure: ESOPHAGOGASTRODUODENOSCOPY (EGD);  Surgeon: Daneil Dolin, MD;  [facility];  Service: Endoscopy;  Laterality: N/A;  . GIVENS CAPSULE STUDY N/A 03/22/2018   Procedure: GIVENS CAPSULE STUDY;  Surgeon: Daneil Dolin, MD;  [facility];  Service: Endoscopy;  Laterality: N/A;  . IR FLUORO GUIDE CV LINE RIGHT  10/29/2018  . IR REMOVAL TUN CV CATH W/O FL  08/06/2019  . IR US GUIDE VASC ACCESS RIGHT  10/29/2018  . PERIPHERAL VASCULAR BALLOON ANGIOPLASTY Left 03/14/2019   Procedure: PERIPHERAL VASCULAR BALLOON ANGIOPLASTY;  Surgeon: Angelia Mould, MD;  Location: [facility];  Service: Cardiovascular;  Laterality: Left;  . PERIPHERAL VASCULAR BALLOON ANGIOPLASTY  05/30/2019   Procedure: PERIPHERAL VASCULAR BALLOON ANGIOPLASTY;  Surgeon: Elam Dutch, MD;  Location: [facility];  Service: Cardiovascular;;  left arm fistula  . RETROPUBIC PROSTATECTOMY  11/26/2001  . TONSILLECTOMY    . VIDEO BRONCHOSCOPY WITH ENDOBRONCHIAL NAVIGATION N/A 09/26/2019   Procedure: VIDEO BRONCHOSCOPY WITH ENDOBRONCHIAL NAVIGATION;  Surgeon: Garner Nash, DO;  Location: [facility];  Service: Thoracic;   Laterality: N/A;  . VIDEO BRONCHOSCOPY WITH ENDOBRONCHIAL ULTRASOUND N/A 09/26/2019   Procedure: VIDEO BRONCHOSCOPY WITH ENDOBRONCHIAL ULTRASOUND;  Surgeon: Garner Nash, DO;  Location: [facility];  Service: Thoracic;  Laterality: N/A;     FAMILY HISTORY:  Family History  Problem Relation Age of Onset  . Diabetes Father   . Heart disease Father 66       MI  . Heart attack Father   . Heart attack Mother   . Heart disease Brother   . Cancer Brother        lung  . Lung cancer Brother   . Heart disease Brother   . Cancer Brother        possibly riddled with cancer  . Lung cancer Sister   . Cancer Sister        lung  . Heart disease Brother   . Lung cancer Brother   . Other Brother 59       accident  . Heart disease Brother   . Lung cancer Nephew      SOCIAL HISTORY:  reports that he quit smoking about a year ago. His smoking use included cigarettes and cigars. He has a 31.00 pack-year smoking history. He  has never used smokeless tobacco. He reports that he does not drink alcohol or use drugs. The patient  lives in [place]. His step son [name] is his HCPOA who helps him make decisions about therapy.   ALLERGIES: Wellbutrin [bupropion]   MEDICATIONS:  Current Outpatient Medications  Medication Sig Dispense Refill  . albuterol (PROAIR HFA) 108 (90 Base) MCG/ACT inhaler Inhale 2 puffs into the lungs 2 (two) times daily.     [name] atorvastatin (LIPITOR) 20 MG tablet Take 1 tablet (20 mg total) by mouth daily. (Needs to be seen) 90 tablet 0  . bisoprolol (ZEBETA) 10 MG tablet TAKE (1) TABLET TWICE A DAY. (Patient taking differently: Take 10 mg by mouth every morning. ) 180 tablet 1  . budesonide-formoterol (SYMBICORT) 160-4.5 MCG/ACT inhaler Inhale 2 puffs into the lungs 2 (two) times daily.    . calcitRIOL (ROCALTROL) 0.25 MCG capsule Take 1 capsule (0.25 mcg total) by mouth every morning. 90 capsule 3  . clonazePAM (KLONOPIN) 1 MG tablet TAKE 1/2 TABLET AT BEDTIME (Patient taking  differently: Take 0.5 mg by mouth at bedtime. ) 15 tablet 1  . cyclobenzaprine (FLEXERIL) 10 MG tablet Take 1 tablet (10 mg total) by mouth 3 (three) times daily as needed for muscle spasms. 30 tablet 0  . diltiazem (CARDIZEM CD) 120 MG 24 hr capsule Take 1 capsule (120 mg total) by mouth daily. 90 capsule 3  . DULoxetine (CYMBALTA) 60 MG capsule Take 60 mg by mouth daily.    . ferrous sulfate 325 (65 FE) MG tablet Take 325 mg by mouth every Monday, Wednesday, and Friday.    . fluticasone (FLONASE) 50 MCG/ACT nasal spray Place 2 sprays into both nostrils daily. (Patient taking differently: Place 2 sprays into both nostrils daily as needed for allergies. ) 16 g 6  . hydroxypropyl methylcellulose / hypromellose (ISOPTO TEARS / GONIOVISC) 2.5 % ophthalmic solution Place 1 drop into both eyes 2 (two) times daily as needed for dry eyes.     . iron polysaccharides (FERREX 150) 150 MG capsule Take 1 capsule (150 mg total) by mouth daily. 90 capsule 0  . lidocaine-prilocaine (EMLA) cream Apply 1 application topically as needed. 30 g 0  . loratadine (CLARITIN) 10 MG tablet TAKE 1 TABLET DAILY 30 tablet 11  . Melatonin 3 MG TABS Take 3 mg by mouth at bedtime. Recently increased to 6 mg per POA    . Multiple Vitamins-Minerals (MENS ONE DAILY PO) Take 1 tablet by mouth daily.    . Multiple Vitamins-Minerals (PRESERVISION AREDS 2+MULTI VIT) CAPS Take 1 capsule by mouth 2 (two) times daily.    . Nutritional Supplements (FEEDING SUPPLEMENT, NEPRO CARB STEADY,) LIQD Take 237 mLs by mouth 2 (two) times daily between meals. 60 Can 0  . omega-3 acid ethyl esters (LOVAZA) 1 g capsule Take 2 g by mouth 2 (two) times daily.    . Omega-3 Fatty Acids (FISH OIL) 1000 MG CAPS Take 2 capsules by mouth 2 (two) times daily.    . pantoprazole (PROTONIX) 40 MG tablet TAKE (1) TABLET TWICE A DAY. 180 tablet 0  . Phenytoin (DILANTIN PO) Take 60 mg by mouth.    . prochlorperazine (COMPAZINE) 10 MG tablet Take 1 tablet (10 mg total)  by mouth every 6 (six) hours as needed for nausea or vomiting. 30 tablet 2  . sevelamer carbonate (RENVELA) 800 MG tablet Take 800 mg by mouth 3 (three) times daily with meals.    . terbinafine (LAMISIL) 250 MG tablet  Take 3 times weekly as directed on dialysis day after dialysis 36 tablet 0  . tiotropium (SPIRIVA) 18 MCG inhalation capsule Place 18 mcg into inhaler and inhale daily.     [name] warfarin (COUMADIN) 2 MG tablet TAKE 2 TABLETS ('4MG'$ ) DAILY EXCEPT ON MONDAY AND THURS TAKE 2 & 1/2 ('5MG'$ ) 70 tablet 0  . acetaminophen (TYLENOL) 325 MG tablet Take 325-650 mg by mouth every 6 (six) hours as needed for moderate pain.     [name] triamcinolone cream (KENALOG) 0.1 %      No current facility-administered medications for this encounter.     REVIEW OF SYSTEMS: On review of systems, the patient has been doing fairly well per Mr. Hassell Done since his chemotherapy administration on Monday. He did have a hypersensitivity reaction to taxol but has not had any persistent swelling or hives. No other complaints are noted.     PHYSICAL EXAM:  Unable to assess due to encounter type.  ECOG = 0  0 - Asymptomatic (Fully active, able to carry on all predisease activities without restriction)  1 - Symptomatic but completely ambulatory (Restricted in physically strenuous activity but ambulatory and able to carry out work of a light or sedentary nature. For example, light housework, office work)  2 - Symptomatic, <50% in bed during the day (Ambulatory and capable of all self care but unable to carry out any work activities. Up and about more than 50% of waking hours)  3 - Symptomatic, >50% in bed, but not bedbound (Capable of only limited self-care, confined to bed or chair 50% or more of waking hours)  4 - Bedbound (Completely disabled. Cannot carry on any self-care. Totally confined to bed or chair)  5 - Death   Eustace Pen MM, Creech RH, Tormey DC, et al. [phone]). "Toxicity and response criteria of the [facility]". [place] Oncol. 5 (6): 649-55    LABORATORY DATA:  Lab Results  Component Value Date   WBC 9.4 10/27/2019   HGB 7.9 (L) 10/27/2019   HCT 25.1 (L) 10/27/2019   MCV 101.6 (H) 10/27/2019   PLT 256 10/27/2019   Lab Results  Component Value Date   NA 143 10/27/2019   K 4.3 10/27/2019   CL 98 10/27/2019   CO2 30 10/27/2019   Lab Results  Component Value Date   ALT 14 10/27/2019   AST 16 10/27/2019   ALKPHOS 154 (H) 10/27/2019   BILITOT 0.5 10/27/2019      RADIOGRAPHY: MR BRAIN WO CONTRAST  Result Date: 10/15/2019 CLINICAL DATA:  Lung cancer staging EXAM: MRI HEAD WITHOUT CONTRAST TECHNIQUE: Multiplanar, multiecho pulse sequences of the brain and surrounding structures were obtained without intravenous contrast. COMPARISON:  MRI head 02/18/2018 FINDINGS: Brain: Intravenous contrast not administered due to GFR 12. Moderate atrophy. Multiple white matter hyperintensities throughout the cerebral hemispheres bilaterally are similar to the prior study. No vasogenic edema. Brainstem and cerebellum intact. Negative for hemorrhage. Negative for metastatic disease on unenhanced imaging. Vascular: Normal arterial flow voids. Skull and upper cervical spine: Negative Sinuses/Orbits: Mild mucosal edema paranasal sinuses. No orbital mass. Bilateral mastoid effusion. Left cataract surgery Other: None IMPRESSION: Atrophy and chronic microvascular ischemia. No acute infarct. No evidence of metastatic disease on unenhanced imaging. Small lesions could be missed without intravenous contrast. Electronically Signed   By: Franchot Gallo M.D.   On: 10/15/2019 15:26   NM PET - Initial Skull Base To Thigh  Result Date: 09/29/2019 CLINICAL DATA:  Initial treatment strategy for  left lung mass. EXAM: NUCLEAR MEDICINE PET SKULL BASE TO THIGH TECHNIQUE: 7.66 mCi F-18 FDG was injected intravenously. Full-ring PET imaging was performed from the skull base to thigh after the radiotracer.  CT data was obtained and used for attenuation correction and anatomic localization. Fasting blood glucose: 93 mg/dl COMPARISON:  Chest CT 09/10/2019 FINDINGS: Mediastinal blood pool activity: SUV max 2.97 Liver activity: SUV max NA NECK: No hypermetabolic lymph nodes in the neck. Incidental CT findings: None. CHEST: 7 cm necrotic left lower lobe lung mass is markedly hypermetabolic with SUV max of 35.32. Central necrosis is noted. There is a small left-sided adjacent pleural effusion but I do not see any hypermetabolism. Small scattered mediastinal and hilar lymph nodes are stable. No enlarged or hypermetabolic mediastinal or hilar lymph nodes. No other pulmonary nodules are identified. Mild underlying emphysematous changes are noted. Incidental CT findings: Aortic and coronary artery calcifications. ABDOMEN/PELVIS: There are bilateral low-attenuation lesion adrenal glands consistent with benign adenomas. No abnormal FDG uptake. No evidence of hepatic metastatic disease. No abdominal/pelvic lymphadenopathy the. Incidental CT findings: Cholelithiasis. Moderate to advanced atherosclerotic calcifications involving the aorta and iliac arteries but no aneurysm. SKELETON: No suspicious bone lesions. Incidental CT findings: none IMPRESSION: 1. 7 cm centrally necrotic left lower lobe mass is hypermetabolic and consistent with primary lung neoplasm. 2. No enlarged or hypermetabolic mediastinal or hilar lymph nodes and no evidence of abdominal/pelvic metastatic disease or osseous metastatic disease. 3. Left-sided pleural effusion without pleural nodules or significant FDG uptake. Electronically Signed   By: Marijo Sanes M.D.   On: 09/29/2019 14:59       IMPRESSION/PLAN: 1. Stage IIB. CT3N0M0, NSCLC, squamous cell cell carcinoma of the LLL. Our conversation was with Mr. Hassell Done, the patient's HCPOA, though the patient was present on the call. Dr. Lisbeth Renshaw discusses the pathology findings and reviews the nature of lung  cancers and the rationale to proceed with chemoRT for a curative intent to treatment.  We discussed the risks, benefits, short, and long term effects of radiotherapy, and the patient/s HCPOA is interested in proceeding with this therapy. Dr. Lisbeth Renshaw discusses the delivery and logistics of radiotherapy and anticipates a course of 6 1/2 weeks of radiotherapy. Verbal consent is obtained from Mr. [name] and placed in the chart, a copy will be provided next Monday at the time of simulation. Due to the Christmas holiday and the only availability for simulation on Monday, we will have to begin radiotherapy on 11/10/2019. Mr. Hassell Done is in agreement with this plan.  2. End stage renal disease on HD. [name] dialysis schedule is T/Th/Sat and we will need to coordinate his treatment to accommodate this schedule. We will also copy his nephrologist in [place] so they're also aware considering that esophagitis from radiotherapy can cause about a 10 pound weight loss, and this could impact his dry weigh for HD.   Given current concerns for patient exposure during the COVID-19 pandemic, this encounter was conducted via telephone.  The patient has given verbal consent for this type of encounter. The time spent during this encounter was 45 minutes and 50% of that time was spent in the coordination of the patient's care. The attendants for this meeting include Shona Simpson, [facility] and [name]  During the encounter, Shona Simpson [facility] was located at [facility] Radiation Oncology Department.  JARMARCUS WAMBOLD  was located at home with his step son and Loney Laurence.  The above documentation reflects my direct findings  during this shared patient visit. Please see the separate note by Dr. Lisbeth Renshaw on this date for the remainder of the patient's plan of care.    Carola Rhine, PAC
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
## 2019-10-29 NOTE — Addendum Note (Signed)
Encounter addended by: Hayden Pedro, PA-C on: 10/29/2019 11:29 AM  Actions taken: Clinical Note Signed, Level of Service modified

## 2019-10-29 NOTE — Addendum Note (Signed)
Addended by: Caryl Pina on: 10/29/2019 11:52 AM   Modules accepted: Orders

## 2019-10-29 NOTE — Telephone Encounter (Signed)
Noon TC to [facility] to start initiation of application for home health aide services (in anticipation of near future need) 1 [phone]. I left a message and am awaiting a call back.  Violeta Gelinas NP-C [phone]

## 2019-10-29 NOTE — Addendum Note (Signed)
Encounter addended by: Hayden Pedro, PA-C on: 10/29/2019 11:10 AM  Actions taken: Pend clinical note

## 2019-10-29 NOTE — Telephone Encounter (Signed)
[name] AWARE LEFT MESSAGE WITH stephanie

## 2019-10-29 NOTE — Addendum Note (Signed)
Encounter addended by: Heywood Footman, RN on: 10/29/2019 9:53 AM  Actions taken: Clinical Note Signed

## 2019-10-29 NOTE — Progress Notes (Signed)
See progress notes under physician encounter.

## 2019-10-29 NOTE — Telephone Encounter (Signed)
Tell him that I sent in [place] for him just in case, he can go ahead and take it 3 times a week, take it right after dialysis Caryl Pina, MD [place] 10/29/2019, 11:52 AM
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
## 2019-10-30 ENCOUNTER — Emergency Department (HOSPITAL_COMMUNITY)
Admission: EM | Admit: 2019-10-30 | Discharge: 2019-10-30 | Disposition: A | Discharge status: Home / Self Care | Payer: No Typology Code available for payment source | Attending: Emergency Medicine | Admitting: Emergency Medicine

## 2019-10-30 ENCOUNTER — Other Ambulatory Visit: Payer: Self-pay

## 2019-10-30 ENCOUNTER — Emergency Department (HOSPITAL_COMMUNITY): Payer: No Typology Code available for payment source

## 2019-10-30 ENCOUNTER — Telehealth: Payer: Self-pay | Admitting: Internal Medicine

## 2019-10-30 ENCOUNTER — Encounter (HOSPITAL_COMMUNITY): Payer: Self-pay

## 2019-10-30 DIAGNOSIS — R0602 Shortness of breath: Secondary | ICD-10-CM | POA: Insufficient documentation

## 2019-10-30 DIAGNOSIS — Z7901 Long term (current) use of anticoagulants: Secondary | ICD-10-CM | POA: Diagnosis not present

## 2019-10-30 DIAGNOSIS — J449 Chronic obstructive pulmonary disease, unspecified: Secondary | ICD-10-CM | POA: Insufficient documentation

## 2019-10-30 DIAGNOSIS — N186 End stage renal disease: Secondary | ICD-10-CM | POA: Diagnosis not present

## 2019-10-30 DIAGNOSIS — Z79899 Other long term (current) drug therapy: Secondary | ICD-10-CM | POA: Insufficient documentation

## 2019-10-30 DIAGNOSIS — F0390 Unspecified dementia without behavioral disturbance: Secondary | ICD-10-CM | POA: Diagnosis not present

## 2019-10-30 DIAGNOSIS — I132 Hypertensive heart and chronic kidney disease with heart failure and with stage 5 chronic kidney disease, or end stage renal disease: Secondary | ICD-10-CM | POA: Insufficient documentation

## 2019-10-30 DIAGNOSIS — R0689 Other abnormalities of breathing: Secondary | ICD-10-CM | POA: Diagnosis not present

## 2019-10-30 DIAGNOSIS — R0789 Other chest pain: Secondary | ICD-10-CM | POA: Diagnosis not present

## 2019-10-30 DIAGNOSIS — R4182 Altered mental status, unspecified: Secondary | ICD-10-CM | POA: Diagnosis not present

## 2019-10-30 DIAGNOSIS — Z87891 Personal history of nicotine dependence: Secondary | ICD-10-CM | POA: Diagnosis not present

## 2019-10-30 DIAGNOSIS — R079 Chest pain, unspecified: Secondary | ICD-10-CM | POA: Diagnosis not present

## 2019-10-30 DIAGNOSIS — Z9221 Personal history of antineoplastic chemotherapy: Secondary | ICD-10-CM | POA: Insufficient documentation

## 2019-10-30 DIAGNOSIS — Z8546 Personal history of malignant neoplasm of prostate: Secondary | ICD-10-CM | POA: Diagnosis not present

## 2019-10-30 DIAGNOSIS — Z992 Dependence on renal dialysis: Secondary | ICD-10-CM | POA: Insufficient documentation

## 2019-10-30 DIAGNOSIS — R072 Precordial pain: Secondary | ICD-10-CM | POA: Diagnosis not present

## 2019-10-30 DIAGNOSIS — R0902 Hypoxemia: Secondary | ICD-10-CM | POA: Diagnosis not present

## 2019-10-30 DIAGNOSIS — Z85118 Personal history of other malignant neoplasm of bronchus and lung: Secondary | ICD-10-CM | POA: Insufficient documentation

## 2019-10-30 DIAGNOSIS — I5032 Chronic diastolic (congestive) heart failure: Secondary | ICD-10-CM | POA: Insufficient documentation

## 2019-10-30 DIAGNOSIS — I4891 Unspecified atrial fibrillation: Secondary | ICD-10-CM | POA: Diagnosis not present

## 2019-10-30 LAB — TROPONIN I (HIGH SENSITIVITY)
Troponin I (High Sensitivity): 20 ng/L — ABNORMAL HIGH (ref <18)
Troponin I (High Sensitivity): 20 ng/L — ABNORMAL HIGH (ref <18)

## 2019-10-30 LAB — CBC
HCT: 27.2 % — ABNORMAL LOW (ref 39.0–52.0)
Hemoglobin: 8.3 g/dL — ABNORMAL LOW (ref 13.0–17.0)
MCH: 31.4 pg (ref 26.0–34.0)
MCHC: 30.5 g/dL (ref 30.0–36.0)
MCV: 103 fL — ABNORMAL HIGH (ref 80.0–100.0)
Platelets: 297 10*3/uL (ref 150–400)
RBC: 2.64 MIL/uL — ABNORMAL LOW (ref 4.22–5.81)
RDW: 16.8 % — ABNORMAL HIGH (ref 11.5–15.5)
WBC: 10.8 10*3/uL — ABNORMAL HIGH (ref 4.0–10.5)
nRBC: 0 % (ref 0.0–0.2)

## 2019-10-30 LAB — BASIC METABOLIC PANEL
Anion gap: 16 — ABNORMAL HIGH (ref 5–15)
BUN: 75 mg/dL — ABNORMAL HIGH (ref 8–23)
CO2: 27 mmol/L (ref 22–32)
Calcium: 8.7 mg/dL — ABNORMAL LOW (ref 8.9–10.3)
Chloride: 95 mmol/L — ABNORMAL LOW (ref 98–111)
Creatinine, Ser: 5.8 mg/dL — ABNORMAL HIGH (ref 0.61–1.24)
GFR calc Af Amer: 10 mL/min — ABNORMAL LOW (ref >60)
GFR calc non Af Amer: 8 mL/min — ABNORMAL LOW (ref >60)
Glucose, Bld: 95 mg/dL (ref 70–99)
Potassium: 5 mmol/L (ref 3.5–5.1)
Sodium: 138 mmol/L (ref 135–145)

## 2019-10-30 LAB — PROTIME-INR
INR: 1.2 (ref 0.8–1.2)
Prothrombin Time: 14.8 seconds (ref 11.4–15.2)

## 2019-10-30 LAB — D-DIMER, QUANTITATIVE: D-Dimer, Quant: 0.67 ug/mL-FEU — ABNORMAL HIGH (ref 0.00–0.50)

## 2019-10-30 MED ORDER — ACETAMINOPHEN 325 MG PO TABS
650.0000 mg | ORAL_TABLET | Freq: Once | ORAL | Status: AC
  Administered 2019-10-30: 650 mg via ORAL
  Filled 2019-10-30: qty 2

## 2019-10-30 MED ORDER — DILTIAZEM LOAD VIA INFUSION: 10.0000 mg | Freq: Once | INTRAVENOUS | Status: DC

## 2019-10-30 MED ORDER — SODIUM CHLORIDE 0.9% FLUSH
3.0000 mL | Freq: Once | INTRAVENOUS | Status: AC
  Administered 2019-10-30: 3 mL via INTRAVENOUS

## 2019-10-30 MED ORDER — DILTIAZEM HCL 25 MG/5ML IV SOLN
10.0000 mg | Freq: Once | INTRAVENOUS | Status: AC
  Administered 2019-10-30: 10 mg via INTRAVENOUS
  Filled 2019-10-30: qty 5

## 2019-10-30 NOTE — ED Notes (Signed)
Pt able to walk from wheelchair to car.

## 2019-10-30 NOTE — Telephone Encounter (Signed)
10:30am:  RTC from [place] (whose name I did not catch; phone# [phone], who advised steps to take towards obtaining home health services through the [place]. -Patient needs to meet criteria: needs assist to bath, dress etc;  homebound.   -family [name]) needs to call the [facility] Primary Care Social Worker Junious Silk [phone]; ext [phone]) to initiate process.   I will e-mail ([email or web address]) this info onto son/HCPOA [name] this morning. I updated him by phone.  Violeta Gelinas NP-C [place] [phone]

## 2019-10-30 NOTE — ED Triage Notes (Signed)
Pt brought to ED via [facility] for mid chest pain since 0850. Pt describes as sharp pain which does not radiate. Pt had 3 baby ASA PTA. Pt denies SOB, dizziness, nausea with chest pain.

## 2019-10-30 NOTE — ED Notes (Signed)
Pt POA Don [name] Done called and given update. Per POA, pt is suppose to have dialysis today, pt started chemo on Monday once a week x 6 weeks then starts radiation, Pt did have his morning meds today. [name] [phone].

## 2019-10-30 NOTE — ED Notes (Signed)
Pt walked across room to wheelchair without any problems or assistance.

## 2019-10-30 NOTE — ED Notes (Signed)
Pt POA called and informed pt was able to walk across room to wheelchair and is ready for discharge.

## 2019-10-30 NOTE — Discharge Instructions (Addendum)
Follow-up with dialysis as planned in your discharge instructions.  Return to the emergency room for any worsening or concerning symptoms.

## 2019-10-30 NOTE — ED Provider Notes (Signed)
78yo male history of non small cell lung CA, on chemo, CP today, improved with ASA. Initial trop 20, repeat 20 (unchanged), dimer age adjusted normal. Awaiting CT due to acting groggy per son. Missed dialysis today- can see [facility] tomorrow for dialysis. INR subtherapeutic, just started back yesterday, monitored.  Physical Exam  BP 119/81   Pulse (!) 103   Temp 97.8 F (36.6 C) (Oral)   Resp (!) 33   Ht 5' 9.5" (1.765 m)   Wt 71.7 kg   SpO2 93%   BMI 23.00 kg/m   Physical Exam Sleeping, easily rouses to verbal stimuli. Frequent, non productive cough. No complaints at this time. ED Course/Procedures   Clinical Course as of Oct 29 1621  Thu Oct 30, 2019  1211 Negative with age adjustment   D-Dimer, Quant(!): 0.67 [MV]    Clinical Course User Index [MV] Eustaquio Maize, PA-C    Procedures  MDM  CT head is unremarkable. Discussed with patient, no complaints at this time. Plan is to attend dilaysis tomorrow as planned above, return to ER for any new or worsening symptoms.       Tacy Learn, PA-C 10/30/19 [place]    Milton Ferguson, MD 10/30/19 [phone]

## 2019-10-30 NOTE — ED Notes (Addendum)
Pt family called and given update and informed pt ready for discharge. Don [name] POA asks if pt can walk. Informed pt was here for chest pain. Per Don [name], if he can't walk then he will not come get him. Per Don [name], Pt was not walking prior to arrival and has been more weak. RN questioned whether this was new or not and if this needed to be assessed, per POA, not new but pt been weak. Per patient, patient lives at home alone and is able to walk and get around. Informed POA, will walk patient and be ready for discharge but if refusing to pick patient up will have to contact APS.

## 2019-10-30 NOTE — ED Provider Notes (Signed)
[facility] EMERGENCY DEPARTMENT Provider Note   CSN: [id number] Arrival date & time: 10/30/19  1049     History Chief Complaint  Patient presents with  . Chest Pain   Level 5 caveat - dementia  Don [name] is a 78 y.o. male with PMHx ESRD on dialysis MWF, a fib on Coumadin, dementia, HTN, HLD, active lung cancer, who presents to the ED today via EMS for sudden onset, 10/10, sharp, substernal chest pain that began earlier this morning around approximately 8 AM although triage report reports 8:50 AM.  He endorses that he was sitting down when the pain began.  Patient had told EMS that he does not feel short of breath although he endorses to me that he does feel mildly short of breath.  He was given 3 baby aspirin prior to arrival and states his chest pain is currently a 1 out of 10.  He does endorse that he missed dialysis yesterday although pt is a T Th S dialysis patient. He states that he "had too much to do yesterday." Patient denies missing any other recent appointments for dialysis.  Patient believes he took all of his medications today but he is not sure including his diltiazem.  He is currently in A. fib and is unable to say if he is always in A. Fib .  Patient denies any other symptoms at this time.  Denies fever, chills, cough, leg swelling, palpitations, any other associated symptoms.  Patient does not produce urine.  Reports that he is not currently undergoing any chemo or radiation.  He states that they are just watching his lung cancer.   Most of history obtained by son who reports that he started chemotherapy on Monday and has plans for 5 more rounds every Monday. He is also scheduled to start radiation soon.  Reports that patient went to dialysis on Tuesday but they did not draw any fluid given he has lost 6 pounds recently.  Son does report that patient has seemed more "groggy" since starting chemo and that he is not at his baseline alertness wise.  Son has not noticed any  unilateral weakness or numbness, speech difficulties, facial droop.  He states that he has seemed more fatigued as of late which has son concerned.        Past Medical History:  Diagnosis Date  . Acute bronchitis 04/03/2014  . Anemia    low iron  . Anxiety   . Atrial fibrillation ([place])   . Cataract   . COPD (chronic obstructive pulmonary disease) ([place])   . Dementia ([place])   . Depression   . Dyspnea    with activity  . ESRD (end stage renal disease) ([place]) 04/03/2014   Stage 5 Dialysis on T/Th/Sa  . Essential hypertension   . Hyperlipidemia   . Lung cancer ([place])   . Macular degeneration   . Noncompliance with medications 04/2013   Xarelto, digoxin previously  . Polyposis coli   . Pre-diabetes   . Prostate cancer ([place])   . Tubular adenoma of colon 07/31/02, 11/18/03    Patient Active Problem List   Diagnosis Date Noted  . Genetic testing 10/16/2019  . 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 Encounter for antineoplastic chemotherapy 10/03/2019  . S/P bronchoscopy   . Polyposis coli   . ESRD (end stage renal disease) ([place]) 10/30/2018  . COPD with acute exacerbation ([place]) 10/30/2018  . Acute on chronic diastolic CHF (congestive heart failure) ([place])  10/30/2018  . Hemodialysis patient ([place])   . Acute renal failure superimposed on stage 5 chronic kidney disease, not on chronic dialysis ([place])   . Avulsion of skin of right forearm   . Fall   . Palliative care by specialist   . Goals of care, counseling/discussion   . DNR (do not resuscitate) discussion   . Atrial fibrillation with RVR ([place]) 03/18/2018  . Mass of lower lobe of left lung 02/12/2018  . Depression, recurrent ([place]) 01/02/2018  . Acute on chronic anemia   . Normocytic anemia   . Coagulopathy ([place]) 01/31/2017  . Pancytopenia ([place]) 01/31/2017  . Pre-diabetes 07/23/2015  . Metabolic syndrome 69/62/9528  . Urinary bladder incontinence 03/04/2015  . Hyperlipidemia LDL goal <130 04/04/2014  .  Personal history of colonic polyps 04/04/2014  . Heme positive stool 04/03/2014  . History of prostate cancer 04/03/2014  . Tobacco abuse 04/25/2013  . Prostate cancer ([place]) 02/08/2011  . COPD (chronic obstructive pulmonary disease) ([place]) 02/08/2011  . Hypertension 02/08/2011    Past Surgical History:  Procedure Laterality Date  . A/V FISTULAGRAM Left 05/30/2019   Procedure: A/V FISTULAGRAM;  Surgeon: Elam Dutch, MD;  Location: [place] [facility];  Service: Cardiovascular;  Laterality: Left;  . Anal abcess,Hemorroids,    . AV FISTULA PLACEMENT Left 06/23/2019   Procedure: INSERTION OF ARTERIOVENOUS (AV) GORE-TEX GRAFT ARM;  Surgeon: Elam Dutch, MD;  Location: [place];  Service: Vascular;  Laterality: Left;  . BASCILIC VEIN TRANSPOSITION Left 10/08/2018   Procedure: FIRST STAGE BASILIC VEIN TRANSPOSITION LEFT ARM;  Surgeon: Angelia Mould, MD;  Location: [place];  Service: Vascular;  Laterality: Left;  . BASCILIC VEIN TRANSPOSITION Left 12/16/2018   Procedure: BASILIC VEIN TRANSPOSITION SECOND STAGE;  Surgeon: Angelia Mould, MD;  Location: [facility] OR;  Service: Vascular;  Laterality: Left;  . COLONOSCOPY N/A 04/05/2014   Dr. Fuller Plan: 6 mm sessile polyp from sigmoid colon (tubular adenoma), internal hemorrhoids  . COLONOSCOPY W/ BIOPSIES  11/18/2003   Dr. Earle Gell  . ESOPHAGOGASTRODUODENOSCOPY  11/18/2003   Dr. Earle Gell  . ESOPHAGOGASTRODUODENOSCOPY N/A 04/05/2014   Dr. Fuller Plan: variable Z line, negative Barrett's, small hiatal hernia  . ESOPHAGOGASTRODUODENOSCOPY N/A 02/02/2017   Dr. Oneida Alar: LA Grade B esophagitis, one moderate benign-appearing intrinsic stenosis traversed, small non-bleeding diverticulum in second portion of duodenum, reactive gastropathy, no H.pylori.  . ESOPHAGOGASTRODUODENOSCOPY N/A 03/22/2018   Procedure: ESOPHAGOGASTRODUODENOSCOPY (EGD);  Surgeon: Daneil Dolin, MD;  [facility];  Service: Endoscopy;  Laterality: N/A;  . GIVENS  CAPSULE STUDY N/A 03/22/2018   Procedure: GIVENS CAPSULE STUDY;  Surgeon: Daneil Dolin, MD;  [facility];  Service: Endoscopy;  Laterality: N/A;  . IR FLUORO GUIDE CV LINE RIGHT  10/29/2018  . IR REMOVAL TUN CV CATH W/O FL  08/06/2019  . IR US GUIDE VASC ACCESS RIGHT  10/29/2018  . PERIPHERAL VASCULAR BALLOON ANGIOPLASTY Left 03/14/2019   Procedure: PERIPHERAL VASCULAR BALLOON ANGIOPLASTY;  Surgeon: Angelia Mould, MD;  Location: [place] [facility];  Service: Cardiovascular;  Laterality: Left;  . PERIPHERAL VASCULAR BALLOON ANGIOPLASTY  05/30/2019   Procedure: PERIPHERAL VASCULAR BALLOON ANGIOPLASTY;  Surgeon: Elam Dutch, MD;  Location: [place] [facility];  Service: Cardiovascular;;  left arm fistula  . RETROPUBIC PROSTATECTOMY  11/26/2001  . TONSILLECTOMY    . VIDEO BRONCHOSCOPY WITH ENDOBRONCHIAL NAVIGATION N/A 09/26/2019   Procedure: VIDEO BRONCHOSCOPY WITH ENDOBRONCHIAL NAVIGATION;  Surgeon: Garner Nash, DO;  Location: [place];  Service: Thoracic;  Laterality: N/A;  . VIDEO BRONCHOSCOPY WITH ENDOBRONCHIAL ULTRASOUND N/A 09/26/2019   Procedure: VIDEO BRONCHOSCOPY WITH ENDOBRONCHIAL ULTRASOUND;  Surgeon: Garner Nash, DO;  Location: [facility];  Service: Thoracic;  Laterality: N/A;       Family History  Problem Relation Age of Onset  . Diabetes Father   . Heart disease Father 67       MI  . Heart attack Father   . Heart attack Mother   . Heart disease Brother   . Cancer Brother        lung  . Lung cancer Brother   . Heart disease Brother   . Cancer Brother        possibly riddled with cancer  . Lung cancer Sister   . Cancer Sister        lung  . Heart disease Brother   . Lung cancer Brother   . Other Brother 17       accident  . Heart disease Brother   . Lung cancer Nephew     Social History   Tobacco Use  . Smoking status: Former Smoker    Packs/day: 0.50    Years: 62.00    Pack years: 31.00    Types: Cigarettes, Cigars    Quit date:  10/27/2018    Years since quitting: 1.0  . Smokeless tobacco: Never Used  . Tobacco comment: "very little"  Substance Use Topics  . Alcohol use: No  . Drug use: No    Home Medications Prior to Admission medications   Medication Sig Start Date End Date Taking? Authorizing Provider  aspirin EC 81 MG tablet Take 243 mg by mouth once as needed for moderate pain.   Yes [provider]  acetaminophen (TYLENOL) 325 MG tablet Take 325-650 mg by mouth every 6 (six) hours as needed for moderate pain.     [provider]  albuterol (PROAIR HFA) 108 (90 Base) MCG/ACT inhaler Inhale 2 puffs into the lungs 2 (two) times daily.     [provider]  atorvastatin (LIPITOR) 20 MG tablet Take 1 tablet (20 mg total) by mouth daily. (Needs to be seen) 10/08/19   Dettinger, Fransisca Kaufmann, MD  bisoprolol (ZEBETA) 10 MG tablet TAKE (1) TABLET TWICE A DAY. Patient taking differently: Take 10 mg by mouth every morning.  10/18/18   Dettinger, Fransisca Kaufmann, MD  budesonide-formoterol (SYMBICORT) 160-4.5 MCG/ACT inhaler Inhale 2 puffs into the lungs 2 (two) times daily.    [provider]  calcitRIOL (ROCALTROL) 0.25 MCG capsule Take 1 capsule (0.25 mcg total) by mouth every morning. 11/18/18   Dettinger, Fransisca Kaufmann, MD  cephALEXin (KEFLEX) 500 MG capsule Take 500mg  tablet 3 times per week after finishing dialysis for 10 days 10/29/19   Dettinger, Fransisca Kaufmann, MD  clonazePAM (KLONOPIN) 1 MG tablet TAKE 1/2 TABLET AT BEDTIME Patient taking differently: Take 0.5 mg by mouth at bedtime.  04/30/19   Dettinger, Fransisca Kaufmann, MD  cyclobenzaprine (FLEXERIL) 10 MG tablet Take 1 tablet (10 mg total) by mouth 3 (three) times daily as needed for muscle spasms. 03/26/19   Dettinger, Fransisca Kaufmann, MD  diltiazem (CARDIZEM CD) 120 MG 24 hr capsule Take 1 capsule (120 mg total) by mouth daily. 04/08/19   Dettinger, Fransisca Kaufmann, MD  DULoxetine (CYMBALTA) 60 MG capsule Take 60 mg by mouth daily.    [provider]  ferrous  sulfate 325 (65 FE) MG tablet Take 325 mg by mouth every Monday, Wednesday,  and Friday.    [provider]  fluticasone (FLONASE) 50 MCG/ACT nasal spray Place 2 sprays into both nostrils daily. Patient taking differently: Place 2 sprays into both nostrils daily as needed for allergies.  11/25/18   Janora Norlander, DO  hydroxypropyl methylcellulose / hypromellose (ISOPTO TEARS / GONIOVISC) 2.5 % ophthalmic solution Place 1 drop into both eyes 2 (two) times daily as needed for dry eyes.     [provider]  iron polysaccharides (FERREX 150) 150 MG capsule Take 1 capsule (150 mg total) by mouth daily. 09/23/19   Dettinger, Fransisca Kaufmann, MD  lidocaine-prilocaine (EMLA) cream Apply 1 application topically as needed. 06/03/19   Dettinger, Fransisca Kaufmann, MD  loratadine (CLARITIN) 10 MG tablet TAKE 1 TABLET DAILY 07/09/19   Dettinger, Fransisca Kaufmann, MD  Melatonin 3 MG TABS Take 3 mg by mouth at bedtime. Recently increased to 6 mg per POA    [provider]  Multiple Vitamins-Minerals (MENS ONE DAILY PO) Take 1 tablet by mouth daily.    [provider]  Multiple Vitamins-Minerals (PRESERVISION AREDS 2+MULTI VIT) CAPS Take 1 capsule by mouth 2 (two) times daily.    [provider]  Nutritional Supplements (FEEDING SUPPLEMENT, NEPRO CARB STEADY,) LIQD Take 237 mLs by mouth 2 (two) times daily between meals. 10/31/18   Orson Eva, MD  omega-3 acid ethyl esters (LOVAZA) 1 g capsule Take 2 g by mouth 2 (two) times daily.    [provider]  Omega-3 Fatty Acids (FISH OIL) 1000 MG CAPS Take 2 capsules by mouth 2 (two) times daily.    [provider]  pantoprazole (PROTONIX) 40 MG tablet TAKE (1) TABLET TWICE A DAY. 10/03/19   Dettinger, Fransisca Kaufmann, MD  Phenytoin (DILANTIN PO) Take 60 mg by mouth.    [provider]  prochlorperazine (COMPAZINE) 10 MG tablet Take 1 tablet (10 mg total) by mouth every 6 (six) hours as needed for nausea or vomiting. 10/03/19    Heilingoetter, Cassandra L, PA-C  sevelamer carbonate (RENVELA) 800 MG tablet Take 800 mg by mouth 3 (three) times daily with meals.    [provider]  terbinafine (LAMISIL) 250 MG tablet Take 3 times weekly as directed on dialysis day after dialysis 09/18/19   Dettinger, Fransisca Kaufmann, MD  tiotropium (SPIRIVA) 18 MCG inhalation capsule Place 18 mcg into inhaler and inhale daily.     [provider]  triamcinolone cream (KENALOG) 0.1 %  06/13/19   [provider]  warfarin (COUMADIN) 2 MG tablet TAKE 2 TABLETS (4MG ) DAILY EXCEPT ON MONDAY AND THURS TAKE 2 & 1/2 (5MG ) 10/08/19   Dettinger, Fransisca Kaufmann, MD    Allergies    Wellbutrin [bupropion]  Review of Systems   Review of Systems  Unable to perform ROS: Dementia  Constitutional: Negative for chills and fever.  Respiratory: Positive for shortness of breath. Negative for cough.   Cardiovascular: Positive for chest pain. Negative for leg swelling.  Gastrointestinal: Negative for abdominal pain, nausea and vomiting.    Physical Exam Updated Vital Signs BP (!) 104/57 (BP Location: Right Arm)   Pulse 74   Temp 97.8 F (36.6 C) (Oral)   Resp (!) 21   Ht 5' 9.5" (1.765 m)   Wt 71.7 kg   SpO2 94%   BMI 23.00 kg/m   Physical Exam Vitals and nursing note reviewed.  Constitutional:      Appearance: He is not ill-appearing or diaphoretic.  HENT:     Head: Normocephalic  and atraumatic.  Eyes:     Conjunctiva/sclera: Conjunctivae normal.  Cardiovascular:     Rate and Rhythm: Tachycardia present. Rhythm irregular.     Pulses:          Radial pulses are 2+ on the right side and 2+ on the left side.       Dorsalis pedis pulses are 2+ on the right side and 2+ on the left side.     Comments: A fib with RVR; rate in the 110s Pulmonary:     Effort: Pulmonary effort is normal.     Breath sounds: Normal breath sounds. No decreased breath sounds, wheezing, rhonchi or rales.  Chest:     Chest wall: No tenderness.    Abdominal:     Palpations: Abdomen is soft.     Tenderness: There is no abdominal tenderness. There is no guarding or rebound.  Musculoskeletal:     Cervical back: Neck supple.     Right lower leg: No tenderness. No edema.     Left lower leg: No tenderness. No edema.  Skin:    General: Skin is warm and dry.  Neurological:     Mental Status: He is alert.     ED Results / Procedures / Treatments   Labs (all labs ordered are listed, but only abnormal results are displayed) Labs Reviewed  BASIC METABOLIC PANEL - Abnormal; Notable for the following components:      Result Value   Chloride 95 (*)    BUN 75 (*)    Creatinine, Ser 5.80 (*)    Calcium 8.7 (*)    GFR calc non Af Amer 8 (*)    GFR calc Af Amer 10 (*)    Anion gap 16 (*)    All other components within normal limits  CBC - Abnormal; Notable for the following components:   WBC 10.8 (*)    RBC 2.64 (*)    Hemoglobin 8.3 (*)    HCT 27.2 (*)    MCV 103.0 (*)    RDW 16.8 (*)    All other components within normal limits  D-DIMER, QUANTITATIVE (NOT AT [facility]) - Abnormal; Notable for the following components:   D-Dimer, Quant 0.67 (*)    All other components within normal limits  TROPONIN I (HIGH SENSITIVITY) - Abnormal; Notable for the following components:   Troponin I (High Sensitivity) 20 (*)    All other components within normal limits  TROPONIN I (HIGH SENSITIVITY) - Abnormal; Notable for the following components:   Troponin I (High Sensitivity) 20 (*)    All other components within normal limits  PROTIME-INR    EKG EKG Interpretation  Date/Time:  Thursday October 30 2019 11:05:17 EST Ventricular Rate:  130 PR Interval:    QRS Duration: 101 QT Interval:  290 QTC Calculation: 427 R Axis:   70 Text Interpretation: Atrial fibrillation Ventricular premature complex Low voltage, extremity leads Repolarization abnormality, prob rate related Similar to October tracing. No STEMI Confirmed by Nanda Quinton [phone])  on 10/30/2019 11:10:35 AM   Radiology DG Chest 2 View  Result Date: 10/30/2019 CLINICAL DATA:  Chest pain. EXAM: CHEST - 2 VIEW COMPARISON:  PET-CT 09/29/2019. Chest x-ray 09/26/2019. CT 09/10/2019. FINDINGS: Mediastinum and hilar structures normal. Persistent left base mass and infiltrate. Small left pleural effusion again noted. Stable cardiomegaly no acute bony abnormality. Degenerative change thoracic spine. IMPRESSION: 1. Persistent left base mass and infiltrate. Small left pleural effusion again noted. Similar findings noted on prior studies. 2.  Stable cardiomegaly. Electronically Signed   By: Marcello Moores  Register   On: 10/30/2019 12:15    Procedures Procedures (including critical care time)  Medications Ordered in ED Medications  sodium chloride flush (NS) 0.9 % injection 3 mL (3 mLs Intravenous Given 10/30/19 1232)  diltiazem (CARDIZEM) injection 10 mg (10 mg Intravenous Given 10/30/19 1232)    ED Course  I have reviewed the triage vital signs and the nursing notes.  Pertinent labs & imaging results that were available during my care of the patient were reviewed by me and considered in my medical decision making (see chart for details).  78 year old male presents the ED for chest pain that started this morning substernally.  He does endorse some shortness of breath to me but denied this to EMS.  No nausea or vomiting.  No history of MIs in the past.  Given 381 mg aspirin with relief.  States his chest pain is currently a 1 out of 10.  Patient does have a history of dementia and unable to give much information.  He thinks that he missed dialysis yesterday but he is actually a dialysis patient Tuesday Thursday Saturday and scheduled for dialysis today.  Does report that he started chemotherapy on Monday and has just seemed more fatigued as of late which has him concerned.  He also reports a 5 pound weight loss recently.   EKG with A. fib with RVR currently.  It does appear unchanged from  previous EKG in terms of tracing.  Patient does report he took his diltiazem today which is informed by son.  Will give 10 mg diltiazem and reevaluate.  Will work-up for ACS at this time.  Given patient has active cancer will obtain D-dimer at this time.  He is satting appropriately at the moment and is currently on Coumadin so have low threshold for PE at this time.  X-ray with persistent left lung mass and pleural effusion unchanged from PET scan.  Initial troponin of 20.  Will repeat.  CBC with white blood cell count of 10,800.  Hemoglobin stable compared to baseline at 8.3.  BMP with baseline potassium 5.0.  Creatinine within normal limits today.  May need dialysis tomorrow given he missed appointment today if he is discharged home.  Will call [facility] in [place] to schedule.  D-dimer is 0.67.  With age adjustment it is negative.  Do not feel patient needs CTA at this time.   Lengthy discussion with son on the phone, unfortunately during this pandemic he is unable to come to the ED to provide further information.  He states that his father has seemed more fatigued as of late with recent chemotherapy starting on Monday.  No known strokelike symptoms.  Patient has no focal neuro deficits on exam and is able to follow commands quite easily.  Given son's concerns will obtain CT head at this time.   Repeat troponin of 20 which is essentially unchanged.  If CT head negative patient to be discharged home.  Our secretary was able to get patient appointment at 11:45 AM tomorrow with [facility] in [place] to have dialysis done.  Will need to follow-up with his oncologist regarding fatigue status post chemo initiation.   At shift change case signed out to Suella Broad, PA-C, who will dispo patient accordingly. If CT Head negative patient to be discharged home.   MDM Rules/Calculators/A&P  Final Clinical Impression(s) / ED Diagnoses Final diagnoses:  Nonspecific chest pain    Rx / DC Orders ED  Discharge Orders    None       Eustaquio Maize, PA-C 10/30/19 1616    Long, Wonda [facility], MD 10/30/19 [phone]

## 2019-10-30 NOTE — ED Notes (Signed)
EDP informed pt POA called and given discharge instruction and questioned whether or not patient could walk and states would not come get patient if patient could not walk and have been weak for days. Informed if refusing to pick patient up would have to contact [facility]PS.

## 2019-10-30 NOTE — ED Notes (Signed)
Pt states he missed his dialysis appointment yesterday.

## 2019-11-03 ENCOUNTER — Telehealth: Payer: Self-pay | Admitting: Medical Oncology

## 2019-11-03 ENCOUNTER — Inpatient Hospital Stay: Payer: No Typology Code available for payment source | Admitting: Nutrition

## 2019-11-03 ENCOUNTER — Inpatient Hospital Stay: Payer: No Typology Code available for payment source

## 2019-11-03 ENCOUNTER — Other Ambulatory Visit: Payer: Self-pay | Admitting: Medical Oncology

## 2019-11-03 ENCOUNTER — Inpatient Hospital Stay (HOSPITAL_BASED_OUTPATIENT_CLINIC_OR_DEPARTMENT_OTHER): Payer: No Typology Code available for payment source | Admitting: Internal Medicine

## 2019-11-03 ENCOUNTER — Encounter: Payer: Self-pay | Admitting: Internal Medicine

## 2019-11-03 ENCOUNTER — Ambulatory Visit
Admission: RE | Admit: 2019-11-03 | Discharge: 2019-11-03 | Disposition: A | Discharge status: Home / Self Care | Payer: No Typology Code available for payment source | Source: Ambulatory Visit | Attending: Radiation Oncology | Admitting: Radiation Oncology

## 2019-11-03 ENCOUNTER — Other Ambulatory Visit: Payer: Self-pay

## 2019-11-03 VITALS — BP 100/60 | HR 67 | Temp 98.3°F | Resp 17 | Ht 69.0 in | Wt 155.1 lb

## 2019-11-03 DIAGNOSIS — C3492 Malignant neoplasm of unspecified part of left bronchus or lung: Secondary | ICD-10-CM | POA: Diagnosis not present

## 2019-11-03 DIAGNOSIS — D649 Anemia, unspecified: Secondary | ICD-10-CM

## 2019-11-03 DIAGNOSIS — Z5111 Encounter for antineoplastic chemotherapy: Secondary | ICD-10-CM

## 2019-11-03 DIAGNOSIS — C3432 Malignant neoplasm of lower lobe, left bronchus or lung: Secondary | ICD-10-CM | POA: Diagnosis not present

## 2019-11-03 LAB — CBC WITH DIFFERENTIAL (CANCER CENTER ONLY)
Abs Immature Granulocytes: 0.06 10*3/uL (ref 0.00–0.07)
Basophils Absolute: 0 10*3/uL (ref 0.0–0.1)
Basophils Relative: 0 %
Eosinophils Absolute: 0 10*3/uL (ref 0.0–0.5)
Eosinophils Relative: 1 %
HCT: 26 % — ABNORMAL LOW (ref 39.0–52.0)
Hemoglobin: 8 g/dL — ABNORMAL LOW (ref 13.0–17.0)
Immature Granulocytes: 1 %
Lymphocytes Relative: 12 %
Lymphs Abs: 0.9 10*3/uL (ref 0.7–4.0)
MCH: 31.3 pg (ref 26.0–34.0)
MCHC: 30.8 g/dL (ref 30.0–36.0)
MCV: 101.6 fL — ABNORMAL HIGH (ref 80.0–100.0)
Monocytes Absolute: 0.4 10*3/uL (ref 0.1–1.0)
Monocytes Relative: 5 %
Neutro Abs: 6.3 10*3/uL (ref 1.7–7.7)
Neutrophils Relative %: 81 %
Platelet Count: 287 10*3/uL (ref 150–400)
RBC: 2.56 MIL/uL — ABNORMAL LOW (ref 4.22–5.81)
RDW: 16.4 % — ABNORMAL HIGH (ref 11.5–15.5)
WBC Count: 7.6 10*3/uL (ref 4.0–10.5)
nRBC: 0 % (ref 0.0–0.2)

## 2019-11-03 LAB — CMP (CANCER CENTER ONLY)
ALT: 14 U/L (ref 0–44)
AST: 13 U/L — ABNORMAL LOW (ref 15–41)
Albumin: 2.3 g/dL — ABNORMAL LOW (ref 3.5–5.0)
Alkaline Phosphatase: 147 U/L — ABNORMAL HIGH (ref 38–126)
Anion gap: 17 — ABNORMAL HIGH (ref 5–15)
BUN: 55 mg/dL — ABNORMAL HIGH (ref 8–23)
CO2: 26 mmol/L (ref 22–32)
Calcium: 8.8 mg/dL — ABNORMAL LOW (ref 8.9–10.3)
Chloride: 100 mmol/L (ref 98–111)
Creatinine: 5.98 mg/dL (ref 0.61–1.24)
GFR, Est AFR Am: 9 mL/min — ABNORMAL LOW
GFR, Estimated: 8 mL/min — ABNORMAL LOW
Glucose, Bld: 183 mg/dL — ABNORMAL HIGH (ref 70–99)
Potassium: 4.5 mmol/L (ref 3.5–5.1)
Sodium: 143 mmol/L (ref 135–145)
Total Bilirubin: 0.5 mg/dL (ref 0.3–1.2)
Total Protein: 6 g/dL — ABNORMAL LOW (ref 6.5–8.1)

## 2019-11-03 LAB — SAMPLE TO BLOOD BANK

## 2019-11-03 LAB — ABO/RH: ABO/RH(D): A POS

## 2019-11-03 LAB — PREPARE RBC (CROSSMATCH)

## 2019-11-03 MED ORDER — ACETAMINOPHEN 325 MG PO TABS
ORAL_TABLET | ORAL | Status: AC
  Filled 2019-11-03: qty 2

## 2019-11-03 MED ORDER — DIPHENHYDRAMINE HCL 25 MG PO CAPS
25.0000 mg | ORAL_CAPSULE | Freq: Once | ORAL | Status: AC
  Administered 2019-11-03: 25 mg via ORAL

## 2019-11-03 MED ORDER — DIPHENHYDRAMINE HCL 25 MG PO CAPS
ORAL_CAPSULE | ORAL | Status: AC
  Filled 2019-11-03: qty 1

## 2019-11-03 MED ORDER — SODIUM CHLORIDE 0.9% IV SOLUTION
250.0000 mL | Freq: Once | INTRAVENOUS | Status: AC
  Administered 2019-11-03: 14:00:00 250 mL via INTRAVENOUS
  Filled 2019-11-03: qty 250

## 2019-11-03 MED ORDER — ACETAMINOPHEN 325 MG PO TABS
650.0000 mg | ORAL_TABLET | Freq: Once | ORAL | Status: AC
  Administered 2019-11-03: 650 mg via ORAL

## 2019-11-03 NOTE — Telephone Encounter (Signed)
CRITICAL VALUE STICKER  CRITICAL VALUE: creatinine 5.98  RECEIVER (on-site recipient of call): Leeon Makar DATE & TIME NOTIFIED: 11/03/2019 1211  MESSENGER (representative from lab):Marie  MD NOTIFIED: Julien Nordmann  TIME OF NOTIFICATION:11/03/19 1212  RESPONSE: Pt on hemodialysis -result to be expected-no further action.

## 2019-11-03 NOTE — Progress Notes (Signed)
[facility] [phone]   [phone]  OFFICE PROGRESS NOTE  Dettinger, Fransisca Kaufmann, MD [place] [address]  DIAGNOSIS: Stage IIB (T3, N0, M0) non-small cell lung cancer, squamous cell carcinoma diagnosed in November 2020 and presented with large cavitary left lower lobe lung mass with no significant hilar or mediastinal lymphadenopathy and no distant metastatic disease.  PRIOR THERAPY: None  CURRENT THERAPY: Weekly concurrent chemoradiation with carboplatin for an AUC of 2 and paclitaxel 45 mg/m.First dose 10/27/2019. Status post 1 cycle.  INTERVAL HISTORY: Don [name] 78 y.o. male returns to the clinic today for follow-up visit.  The patient is feeling fine today with no concerning complaints except for fatigue.  He tolerated the first cycle of his treatment with carboplatin and paclitaxel fairly well.  He was seen by Dr. Lisbeth Renshaw recently but scheduled to start his first fraction of radiotherapy on November 10, 2019.  The patient denied having any current chest pain but has shortness of breath with exertion with no cough or hemoptysis.  He denied having any fever or chills.  He has no nausea, vomiting, diarrhea or constipation.  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  He is here today for evaluation before starting cycle #2.   MEDICAL HISTORY: Past Medical History:  Diagnosis Date  . Acute bronchitis 04/03/2014  . Anemia    low iron  . Anxiety   . Atrial fibrillation ([place])   . Cataract   . COPD (chronic obstructive pulmonary disease) ([place])   . Dementia ([place])   . Depression   . Dyspnea    with activity  . ESRD (end stage renal disease) ([place]) 04/03/2014   Stage 5 Dialysis on T/Th/Sa  . Essential hypertension   . Hyperlipidemia   . Lung cancer ([place])   . Macular degeneration   . Noncompliance with medications 04/2013   Xarelto, digoxin previously  . Polyposis coli   . Pre-diabetes   . Prostate  cancer ([place])   . Tubular adenoma of colon 07/31/02, 11/18/03    ALLERGIES:  is allergic to wellbutrin [bupropion].  MEDICATIONS:  Current Outpatient Medications  Medication Sig Dispense Refill  . albuterol (PROAIR HFA) 108 (90 Base) MCG/ACT inhaler Inhale 2 puffs into the lungs 2 (two) times daily.     [name] aspirin EC 81 MG tablet Take 243 mg by mouth once as needed for moderate pain (AS ADVISED BY ems).     [name] atorvastatin (LIPITOR) 20 MG tablet Take 1 tablet (20 mg total) by mouth daily. (Needs to be seen) (Patient taking differently: Take 20 mg by mouth at bedtime. ) 90 tablet 0  . bisoprolol (ZEBETA) 10 MG tablet TAKE (1) TABLET TWICE A DAY. (Patient taking differently: Take 10 mg by mouth every morning. ) 180 tablet 1  . budesonide-formoterol (SYMBICORT) 160-4.5 MCG/ACT inhaler Inhale 2 puffs into the lungs 2 (two) times daily.    . cephALEXin (KEFLEX) 500 MG capsule Take 500mg  tablet 3 times per week after finishing dialysis for 10 days (Patient taking differently: Take 500 mg by mouth every Tuesday, Thursday, and Saturday at 6 PM. Take 500mg  tablet 3 times per week after finishing dialysis for 10 days starting on 10/29/2019) 9 capsule 0  . clonazePAM (KLONOPIN) 1 MG tablet TAKE 1/2 TABLET AT BEDTIME (Patient taking differently: Take 0.5 mg by mouth at bedtime. ) 15 tablet 1  . diltiazem (CARDIZEM CD) 120 MG 24 hr capsule Take 1 capsule (  120 mg total) by mouth daily. 90 capsule 3  . DULoxetine (CYMBALTA) 60 MG capsule Take 60 mg by mouth daily.    . ferrous sulfate 325 (65 FE) MG tablet Take 325 mg by mouth every Monday, Wednesday, and Friday.    . hydroxypropyl methylcellulose / hypromellose (ISOPTO TEARS / GONIOVISC) 2.5 % ophthalmic solution Place 1 drop into both eyes 2 (two) times daily as needed for dry eyes.     . iron polysaccharides (FERREX 150) 150 MG capsule Take 1 capsule (150 mg total) by mouth daily. 90 capsule 0  . lidocaine-prilocaine (EMLA) cream Apply 1 application topically as  needed. (Patient taking differently: Apply 1 application topically every Tuesday, Thursday, and Saturday at 6 PM. ) 30 g 0  . loratadine (CLARITIN) 10 MG tablet TAKE 1 TABLET DAILY (Patient taking differently: Take 10 mg by mouth daily. ) 30 tablet 11  . Melatonin 3 MG TABS Take 6 mg by mouth at bedtime. Recently increased to 6 mg per POA    . Multiple Vitamins-Minerals (MENS ONE DAILY PO) Take 1 tablet by mouth at bedtime.     . Multiple Vitamins-Minerals (PRESERVISION AREDS 2+MULTI VIT) CAPS Take 1 capsule by mouth 2 (two) times daily.    . Nutritional Supplements (FEEDING SUPPLEMENT, NEPRO CARB STEADY,) LIQD Take 237 mLs by mouth 2 (two) times daily between meals. 60 Can 0  . Omega-3 Fatty Acids (FISH OIL) 1000 MG CAPS Take 2 capsules by mouth 2 (two) times daily.    . pantoprazole (PROTONIX) 40 MG tablet TAKE (1) TABLET TWICE A DAY. (Patient taking differently: Take 40 mg by mouth 2 (two) times daily. ) 180 tablet 0  . prochlorperazine (COMPAZINE) 10 MG tablet Take 1 tablet (10 mg total) by mouth every 6 (six) hours as needed for nausea or vomiting. 30 tablet 2  . terbinafine (LAMISIL) 250 MG tablet Take 3 times weekly as directed on dialysis day after dialysis (Patient taking differently: Take 250 mg by mouth every Tuesday, Thursday, and Saturday at 6 PM. Take 3 times weekly as directed on dialysis day after dialysis) 36 tablet 0  . tiotropium (SPIRIVA) 18 MCG inhalation capsule Place 18 mcg into inhaler and inhale daily.     [name] triamcinolone cream (KENALOG) 0.1 % Apply 1 application topically daily as needed (for itching/skin irritation).     . warfarin (COUMADIN) 2 MG tablet TAKE 2 TABLETS (4MG ) DAILY EXCEPT ON MONDAY AND THURS TAKE 2 & 1/2 (5MG ) (Patient taking differently: Take 4 mg by mouth See admin instructions. TAKE 2 TABLETS (4MG ) DAILY EXCEPT ON MONDAY AND THURS TAKE 2 & 1/2 (5MG )) 70 tablet 0   No current facility-administered medications for this visit.    SURGICAL HISTORY:  Past  Surgical History:  Procedure Laterality Date  . A/V FISTULAGRAM Left 05/30/2019   Procedure: A/V FISTULAGRAM;  Surgeon: Elam Dutch, MD;  Location: [facility];  Service: Cardiovascular;  Laterality: Left;  . Anal abcess,Hemorroids,    . AV FISTULA PLACEMENT Left 06/23/2019   Procedure: INSERTION OF ARTERIOVENOUS (AV) GORE-TEX GRAFT ARM;  Surgeon: Elam Dutch, MD;  Location: [facility];  Service: Vascular;  Laterality: Left;  . BASCILIC VEIN TRANSPOSITION Left 10/08/2018   Procedure: FIRST STAGE BASILIC VEIN TRANSPOSITION LEFT ARM;  Surgeon: Angelia Mould, MD;  Location: [facility];  Service: Vascular;  Laterality: Left;  . BASCILIC VEIN TRANSPOSITION Left 12/16/2018   Procedure: BASILIC VEIN TRANSPOSITION SECOND STAGE;  Surgeon: Angelia Mould, MD;  [facility];  Service: Vascular;  Laterality: Left;  . COLONOSCOPY N/A 04/05/2014   Dr. Fuller Plan: 6 mm sessile polyp from sigmoid colon (tubular adenoma), internal hemorrhoids  . COLONOSCOPY W/ BIOPSIES  11/18/2003   Dr. Earle Gell  . ESOPHAGOGASTRODUODENOSCOPY  11/18/2003   Dr. Earle Gell  . ESOPHAGOGASTRODUODENOSCOPY N/A 04/05/2014   Dr. Fuller Plan: variable Z line, negative Barrett's, small hiatal hernia  . ESOPHAGOGASTRODUODENOSCOPY N/A 02/02/2017   Dr. [place] Alar: LA Grade B esophagitis, one moderate benign-appearing intrinsic stenosis traversed, small non-bleeding diverticulum in second portion of duodenum, reactive gastropathy, no H.pylori.  . ESOPHAGOGASTRODUODENOSCOPY N/A 03/22/2018   Procedure: ESOPHAGOGASTRODUODENOSCOPY (EGD);  Surgeon: Daneil Dolin, MD;  [facility];  Service: Endoscopy;  Laterality: N/A;  . GIVENS CAPSULE STUDY N/A 03/22/2018   Procedure: GIVENS CAPSULE STUDY;  Surgeon: Daneil Dolin, MD;  [facility];  Service: Endoscopy;  Laterality: N/A;  . IR FLUORO GUIDE CV LINE RIGHT  10/29/2018  . IR REMOVAL TUN CV CATH W/O FL  08/06/2019  . IR US GUIDE VASC ACCESS RIGHT   10/29/2018  . PERIPHERAL VASCULAR BALLOON ANGIOPLASTY Left 03/14/2019   Procedure: PERIPHERAL VASCULAR BALLOON ANGIOPLASTY;  Surgeon: Angelia Mould, MD;  Location: [facility];  Service: Cardiovascular;  Laterality: Left;  . PERIPHERAL VASCULAR BALLOON ANGIOPLASTY  05/30/2019   Procedure: PERIPHERAL VASCULAR BALLOON ANGIOPLASTY;  Surgeon: Elam Dutch, MD;  Location: [facility];  Service: Cardiovascular;;  left arm fistula  . RETROPUBIC PROSTATECTOMY  11/26/2001  . TONSILLECTOMY    . VIDEO BRONCHOSCOPY WITH ENDOBRONCHIAL NAVIGATION N/A 09/26/2019   Procedure: VIDEO BRONCHOSCOPY WITH ENDOBRONCHIAL NAVIGATION;  Surgeon: Garner Nash, DO;  Location: [facility];  Service: Thoracic;  Laterality: N/A;  . VIDEO BRONCHOSCOPY WITH ENDOBRONCHIAL ULTRASOUND N/A 09/26/2019   Procedure: VIDEO BRONCHOSCOPY WITH ENDOBRONCHIAL ULTRASOUND;  Surgeon: Garner Nash, DO;  [facility];  Service: Thoracic;  Laterality: N/A;    REVIEW OF SYSTEMS:  Constitutional: positive for fatigue Eyes: negative Ears, nose, mouth, throat, and face: negative Respiratory: positive for cough and dyspnea on exertion Cardiovascular: negative Gastrointestinal: negative Genitourinary:negative Integument/breast: negative Hematologic/lymphatic: negative Musculoskeletal:positive for muscle weakness Neurological: negative Behavioral/Psych: negative Endocrine: negative Allergic/Immunologic: negative   PHYSICAL EXAMINATION: General appearance: alert, cooperative, fatigued and no distress Head: Normocephalic, without obvious abnormality, atraumatic Neck: no adenopathy, no JVD, supple, symmetrical, trachea midline and thyroid not enlarged, symmetric, no tenderness/mass/nodules Lymph nodes: Cervical, supraclavicular, and axillary nodes normal. Resp: clear to auscultation bilaterally Back: symmetric, no curvature. ROM normal. No CVA tenderness. Cardio: regular rate and rhythm, S1, S2 normal, no murmur,  click, rub or gallop GI: soft, non-tender; bowel sounds normal; no masses,  no organomegaly Extremities: extremities normal, atraumatic, no cyanosis or edema Neurologic: Alert and oriented X 3, normal strength and tone. Normal symmetric reflexes. Normal coordination and gait  ECOG PERFORMANCE STATUS: 2 - Symptomatic, <50% confined to bed  Blood pressure 100/60, pulse 67, temperature 98.3 F (36.8 C), temperature source Temporal, resp. rate 17, height 5\' 9"  (1.753 m), weight 155 lb 1.6 oz (70.4 kg), SpO2 97 %.  LABORATORY DATA: Lab Results  Component Value Date   WBC 7.6 11/03/2019   HGB 8.0 (L) 11/03/2019   HCT 26.0 (L) 11/03/2019   MCV 101.6 (H) 11/03/2019   PLT 287 11/03/2019      Chemistry      Component Value Date/Time   NA 143 11/03/2019 1112   NA 143 02/27/2019 1120   K 4.5 11/03/2019 1112  CL 100 11/03/2019 1112   CO2 26 11/03/2019 1112   BUN 55 (H) 11/03/2019 1112   BUN 59 (H) 02/27/2019 1120   CREATININE 5.98 (HH) 11/03/2019 1112   CREATININE 1.41 (H) 05/15/2013 1150      Component Value Date/Time   CALCIUM 8.8 (L) 11/03/2019 1112   ALKPHOS 147 (H) 11/03/2019 1112   AST 13 (L) 11/03/2019 1112   ALT 14 11/03/2019 1112   BILITOT 0.5 11/03/2019 1112       RADIOGRAPHIC STUDIES: DG Chest 2 View  Result Date: 10/30/2019 CLINICAL DATA:  Chest pain. EXAM: CHEST - 2 VIEW COMPARISON:  PET-CT 09/29/2019. Chest x-ray 09/26/2019. CT 09/10/2019. FINDINGS: Mediastinum and hilar structures normal. Persistent left base mass and infiltrate. Small left pleural effusion again noted. Stable cardiomegaly no acute bony abnormality. Degenerative change thoracic spine. IMPRESSION: 1. Persistent left base mass and infiltrate. Small left pleural effusion again noted. Similar findings noted on prior studies. 2.  Stable cardiomegaly. Electronically Signed   By: Marcello Moores  Register   On: 10/30/2019 12:15   CT Head Wo Contrast  Result Date: 10/30/2019 CLINICAL DATA:  Chest pain, mental  status changes. EXAM: CT HEAD WITHOUT CONTRAST TECHNIQUE: Contiguous axial images were obtained from the base of the skull through the vertex without intravenous contrast. COMPARISON:  02/26/2019 FINDINGS: Brain: There is atrophy and chronic small vessel disease changes. No acute intracranial abnormality. Specifically, no hemorrhage, hydrocephalus, mass lesion, acute infarction, or significant intracranial injury. Vascular: No hyperdense vessel or unexpected calcification. Skull: No acute calvarial abnormality. Sinuses/Orbits: Visualized paranasal sinuses and mastoids clear. Orbital soft tissues unremarkable. Other: None IMPRESSION: Atrophy, chronic microvascular disease. No acute intracranial abnormality. Electronically Signed   By: Rolm Baptise M.D.   On: 10/30/2019 17:40   MR BRAIN WO CONTRAST  Result Date: 10/15/2019 CLINICAL DATA:  Lung cancer staging EXAM: MRI HEAD WITHOUT CONTRAST TECHNIQUE: Multiplanar, multiecho pulse sequences of the brain and surrounding structures were obtained without intravenous contrast. COMPARISON:  MRI head 02/18/2018 FINDINGS: Brain: Intravenous contrast not administered due to GFR 12. Moderate atrophy. Multiple white matter hyperintensities throughout the cerebral hemispheres bilaterally are similar to the prior study. No vasogenic edema. Brainstem and cerebellum intact. Negative for hemorrhage. Negative for metastatic disease on unenhanced imaging. Vascular: Normal arterial flow voids. Skull and upper cervical spine: Negative Sinuses/Orbits: Mild mucosal edema paranasal sinuses. No orbital mass. Bilateral mastoid effusion. Left cataract surgery Other: None IMPRESSION: Atrophy and chronic microvascular ischemia. No acute infarct. No evidence of metastatic disease on unenhanced imaging. Small lesions could be missed without intravenous contrast. Electronically Signed   By: Franchot Gallo M.D.   On: 10/15/2019 15:26    ASSESSMENT AND PLAN: This is a very pleasant 78 years old  white male recently diagnosed with unresectable stage IIb non-small cell lung cancer, squamous cell carcinoma presented with large cavitary left lower lobe lung mass.  He also has history of end-stage renal disease and currently on hemodialysis. The patient is currently undergoing a course of concurrent chemoradiation with weekly carboplatin and paclitaxel.  He received 1 dose of chemotherapy but his radiation is scheduled for November 10, 2019. I recommended for the patient to hold his chemotherapy for this week until we start the radiation next week. For the anemia of chronic disease, I will arrange for the patient to receive 2 units of PRBCs transfusion today. For the end-stage renal disease, he will continue his dialysis on Tuesday, Thursday and Saturday as planned in [place]. He will come back for follow-up visit  in 2 weeks for reevaluation and management of any adverse effect of his treatment. The patient was advised to call immediately if he has any concerning symptoms in the interval. The patient voices understanding of current disease status and treatment options and is in agreement with the current care plan.  All questions were answered. The patient knows to call the clinic with any problems, questions or concerns. We can certainly see the patient much sooner if necessary.  I spent 15 minutes counseling the patient face to face. The total time spent in the appointment was 25 minutes.  Disclaimer: This note was dictated with voice recognition software. Similar sounding words can inadvertently be transcribed and may not be corrected upon review.

## 2019-11-03 NOTE — Patient Instructions (Signed)
[facility] Discharge Instructions for Patients Receiving C  BELOW ARE SYMPTOMS THAT SHOULD BE REPORTED IMMEDIATELY:  *FEVER GREATER THAN 100.5 F  *CHILLS WITH OR WITHOUT FEVER  NAUSEA AND VOMITING THAT IS NOT CONTROLLED WITH YOUR NAUSEA MEDICATION  *UNUSUAL SHORTNESS OF BREATH  *UNUSUAL BRUISING OR BLEEDING  TENDERNESS IN MOUTH AND THROAT WITH OR WITHOUT PRESENCE OF ULCERS  *URINARY PROBLEMS  *BOWEL PROBLEMS  UNUSUAL RASH Items with * indicate a potential emergency and should be followed up as soon as possible.  Feel free to call the clinic should you have any questions or concerns. The clinic phone number is [phone].  Please show the [facility] at check-in to the Emergency Department and triage nurse.   Blood Transfusion, Adult  A blood transfusion is a procedure in which you receive donated blood, including plasma, platelets, and red blood cells, through an IV tube. You may need a blood transfusion because of illness, surgery, or injury. The blood may come from a donor. You may also be able to donate blood for yourself (autologous blood donation) before a surgery if you know that you might require a blood transfusion. The blood given in a transfusion is made up of different types of cells. You may receive:  Red blood cells. These carry oxygen to the cells in the body.  White blood cells. These help you fight infections.  Platelets. These help your blood to clot.  Plasma. This is the liquid part of your blood and it helps with fluid imbalances. If you have hemophilia or another clotting disorder, you may also receive other types of blood products. Tell a health care provider about:  Any allergies you have.  All medicines you are taking, including vitamins, herbs, eye drops, creams, and over-the-counter medicines.  Any problems you or family members have had with anesthetic medicines.  Any blood disorders you have.  Any surgeries you  have had.  Any medical conditions you have, including any recent fever or cold symptoms.  Whether you are pregnant or may be pregnant.  Any previous reactions you have had during a blood transfusion. What are the risks? Generally, this is a safe procedure. However, problems may occur, including:  Having an allergic reaction to something in the donated blood. Hives and itching may be symptoms of this type of reaction.  Fever. This may be a reaction to the white blood cells in the transfused blood. Nausea or chest pain may accompany a fever.  Iron overload. This can happen from having many transfusions.  Transfusion-related acute lung injury (TRALI). This is a rare reaction that causes lung damage. The cause is not known.TRALI can occur within hours of a transfusion or several days later.  Sudden (acute) or delayed hemolytic reactions. This happens if your blood does not match the cells in your transfusion. Your body's defense system (immune system) may try to attack the new cells. This complication is rare. The symptoms include fever, chills, nausea, and low back pain or chest pain.  Infection or disease transmission. This is rare. What happens before the procedure?  You will have a blood test to determine your blood type. This is necessary to know what kind of blood your body will accept and to match it to the donor blood.  If you are going to have a planned surgery, you may be able to do an autologous blood donation. This may be done in case you need to have a transfusion.  If you have had  an allergic reaction to a transfusion in the past, you may be given medicine to help prevent a reaction. This medicine may be given to you by mouth or through an IV tube.  You will have your temperature, blood pressure, and pulse monitored before the transfusion.  Follow instructions from your health care provider about eating and drinking restrictions.  Ask your health care provider  about: ? Changing or stopping your regular medicines. This is especially important if you are taking diabetes medicines or blood thinners. ? Taking medicines such as aspirin and ibuprofen. These medicines can thin your blood. Do not take these medicines before your procedure if your health care provider instructs you not to. What happens during the procedure?  An IV tube will be inserted into one of your veins.  The bag of donated blood will be attached to your IV tube. The blood will then enter through your vein.  Your temperature, blood pressure, and pulse will be monitored regularly during the transfusion. This monitoring is done to detect early signs of a transfusion reaction.  If you have any signs or symptoms of a reaction, your transfusion will be stopped and you may be given medicine.  When the transfusion is complete, your IV tube will be removed.  Pressure may be applied to the IV site for a few minutes.  A bandage (dressing) will be applied. The procedure may vary among health care providers and hospitals. What happens after the procedure?  Your temperature, blood pressure, heart rate, breathing rate, and blood oxygen level will be monitored often.  Your blood may be tested to see how you are responding to the transfusion.  You may be warmed with fluids or blankets to maintain a normal body temperature. Summary  A blood transfusion is a procedure in which you receive donated blood, including plasma, platelets, and red blood cells, through an IV tube.  Your temperature, blood pressure, and pulse will be monitored before, during, and after the transfusion.  Your blood may be tested after the transfusion to see how your body has responded. This information is not intended to replace advice given to you by your health care provider. Make sure you discuss any questions you have with your health care provider. Document Released: 10/27/2000 Document Revised: 09/16/2018 Document  Reviewed: 07/27/2016 Elsevier Patient Education  2020 [employer].

## 2019-11-03 NOTE — Progress Notes (Signed)
Patient is an 78 year old male diagnosed with lung cancer receiving chemoradiation therapy.  Past medical history includes prostate cancer, prediabetes, hyperlipidemia, hypertension, dementia, COPD, atrial fibrillation, anxiety, and end-stage renal disease on dialysis.  Medications include Klonopin, Cymbalta, ferrous sulfate, multivitamin, Nepro twice daily, omega-3 fatty acids, Protonix, Compazine.  Labs include glucose 183, BUN 55, creatinine 5.98, and albumin 2.3.  Height: 69-1/2 inches. Weight: 155.1 pounds December 21. Usual body weight: 160 pounds in May. BMI: 22.9.  Patient received Benadryl as part of his treatment.  He is very disoriented and confused and unable to answer questions about nutrition. Noted patient receives dialysis and likely has a dietitian who follows him at the dialysis center. Records indicate that he is taking Nepro twice daily which is an appropriate oral nutrition supplement for someone with renal disease and diabetes. Weight is down 5 pounds over 7 months which is not significant.  This could also be fluid shifts before/after dialysis.  No nutritional diagnosis at this time.  Is consuming an oral nutrition supplement and weight loss is minimal.  We will follow patient as needed.  **Disclaimer: This note was dictated with voice recognition software. Similar sounding words can inadvertently be transcribed and this note may contain transcription errors which may not have been corrected upon publication of note.**

## 2019-11-04 ENCOUNTER — Other Ambulatory Visit: Payer: Medicare HMO

## 2019-11-04 ENCOUNTER — Ambulatory Visit: Payer: Medicare HMO | Admitting: Physician Assistant

## 2019-11-04 ENCOUNTER — Ambulatory Visit: Payer: Medicare HMO

## 2019-11-05 ENCOUNTER — Other Ambulatory Visit: Payer: Self-pay

## 2019-11-05 ENCOUNTER — Inpatient Hospital Stay: Payer: No Typology Code available for payment source

## 2019-11-05 DIAGNOSIS — J449 Chronic obstructive pulmonary disease, unspecified: Secondary | ICD-10-CM | POA: Diagnosis not present

## 2019-11-05 DIAGNOSIS — I5033 Acute on chronic diastolic (congestive) heart failure: Secondary | ICD-10-CM | POA: Diagnosis not present

## 2019-11-05 DIAGNOSIS — I4891 Unspecified atrial fibrillation: Secondary | ICD-10-CM | POA: Diagnosis not present

## 2019-11-05 DIAGNOSIS — H3530 Unspecified macular degeneration: Secondary | ICD-10-CM | POA: Diagnosis not present

## 2019-11-05 DIAGNOSIS — R079 Chest pain, unspecified: Secondary | ICD-10-CM | POA: Diagnosis not present

## 2019-11-05 DIAGNOSIS — G8929 Other chronic pain: Secondary | ICD-10-CM | POA: Diagnosis not present

## 2019-11-05 DIAGNOSIS — D649 Anemia, unspecified: Secondary | ICD-10-CM

## 2019-11-05 DIAGNOSIS — F0390 Unspecified dementia without behavioral disturbance: Secondary | ICD-10-CM | POA: Diagnosis not present

## 2019-11-05 DIAGNOSIS — N186 End stage renal disease: Secondary | ICD-10-CM | POA: Diagnosis not present

## 2019-11-05 DIAGNOSIS — I132 Hypertensive heart and chronic kidney disease with heart failure and with stage 5 chronic kidney disease, or end stage renal disease: Secondary | ICD-10-CM | POA: Diagnosis not present

## 2019-11-05 LAB — PREPARE RBC (CROSSMATCH)

## 2019-11-05 MED ORDER — ACETAMINOPHEN 325 MG PO TABS
ORAL_TABLET | ORAL | Status: AC
  Filled 2019-11-05: qty 2

## 2019-11-05 MED ORDER — DIPHENHYDRAMINE HCL 25 MG PO CAPS
25.0000 mg | ORAL_CAPSULE | Freq: Once | ORAL | Status: AC
  Administered 2019-11-05: 15:00:00 25 mg via ORAL

## 2019-11-05 MED ORDER — SODIUM CHLORIDE 0.9% IV SOLUTION
250.0000 mL | Freq: Once | INTRAVENOUS | Status: DC
  Filled 2019-11-05: qty 250

## 2019-11-05 MED ORDER — DIPHENHYDRAMINE HCL 25 MG PO CAPS
ORAL_CAPSULE | ORAL | Status: AC
  Filled 2019-11-05: qty 1

## 2019-11-05 MED ORDER — ACETAMINOPHEN 325 MG PO TABS
650.0000 mg | ORAL_TABLET | Freq: Once | ORAL | Status: AC
  Administered 2019-11-05: 15:00:00 650 mg via ORAL

## 2019-11-05 NOTE — Patient Instructions (Signed)
Blood Transfusion, Adult, Care After This sheet gives you information about how to care for yourself after your procedure. Your doctor may also give you more specific instructions. If you have problems or questions, contact your doctor. Follow these instructions at home:   Take over-the-counter and prescription medicines only as told by your doctor.  Go back to your normal activities as told by your doctor.  Follow instructions from your doctor about how to take care of the area where an IV tube was put into your vein (insertion site). Make sure you: ? Wash your hands with soap and water before you change your bandage (dressing). If there is no soap and water, use hand sanitizer. ? Change your bandage as told by your doctor.  Check your IV insertion site every day for signs of infection. Check for: ? More redness, swelling, or pain. ? More fluid or blood. ? Warmth. ? Pus or a bad smell. Contact a doctor if:  You have more redness, swelling, or pain around the IV insertion site.  You have more fluid or blood coming from the IV insertion site.  Your IV insertion site feels warm to the touch.  You have pus or a bad smell coming from the IV insertion site.  Your pee (urine) turns pink, red, or brown.  You feel weak after doing your normal activities. Get help right away if:  You have signs of a serious allergic or body defense (immune) system reaction, including: ? Itchiness. ? Hives. ? Trouble breathing. ? Anxiety. ? Pain in your chest or lower back. ? Fever, flushing, and chills. ? Fast pulse. ? Rash. ? Watery poop (diarrhea). ? Throwing up (vomiting). ? Dark pee. ? Serious headache. ? Dizziness. ? Stiff neck. ? Yellow color in your face or the white parts of your eyes (jaundice). Summary  After a blood transfusion, return to your normal activities as told by your doctor.  Every day, check for signs of infection where the IV tube was put into your vein.  Some  signs of infection are warm skin, more redness and pain, more fluid or blood, and pus or a bad smell where the needle went in.  Contact your doctor if you feel weak or have any unusual symptoms. This information is not intended to replace advice given to you by your health care provider. Make sure you discuss any questions you have with your health care provider. Document Released: 11/20/2014 Document Revised: 03/06/2018 Document Reviewed: 06/23/2016 Elsevier Patient Education  2020 [place] (COVID-19) Are you at risk?  Are you at risk for the Coronavirus (COVID-19)?  To be considered HIGH RISK for Coronavirus (COVID-19), you have to meet the following criteria:  . Traveled to [place], [place], [place], [place] or [place]; or in the [place] to [place], [place], [place], or [place]; and have fever, cough, and shortness of breath within the last 2 weeks of travel OR . Been in close contact with a person diagnosed with COVID-19 within the last 2 weeks and have fever, cough, and shortness of breath . IF YOU DO NOT MEET THESE CRITERIA, YOU ARE CONSIDERED LOW RISK FOR COVID-19.  What to do if you are HIGH RISK for COVID-19?  [name] If you are having a medical emergency, call 911. . Seek medical care right away. Before you go to a doctor's office, urgent care or emergency department, call ahead and tell them about your recent travel, contact with someone diagnosed with COVID-19, and  your symptoms. You should receive instructions from your physician's office regarding next steps of care.  . When you arrive at healthcare provider, tell the healthcare staff immediately you have returned from visiting [place], [place], [place], [place] or [place]; or traveled in the [place] to [place], [place], [place], or [place]; in the last two weeks or you have been in close contact with a person diagnosed with COVID-19 in the last 2 weeks.   . Tell the health care staff about  your symptoms: fever, cough and shortness of breath. . After you have been seen by a medical provider, you will be either: o Tested for (COVID-19) and discharged home on quarantine except to seek medical care if symptoms worsen, and asked to  - Stay home and avoid contact with others until you get your results (4-5 days)  - Avoid travel on public transportation if possible (such as bus, train, or airplane) or o Sent to the Emergency Department by EMS for evaluation, COVID-19 testing, and possible admission depending on your condition and test results.  What to do if you are LOW RISK for COVID-19?  Reduce your risk of any infection by using the same precautions used for avoiding the common cold or flu:  [name] Wash your hands often with soap and warm water for at least 20 seconds.  If soap and water are not readily available, use an alcohol-based hand sanitizer with at least 60% alcohol.  . If coughing or sneezing, cover your mouth and nose by coughing or sneezing into the elbow areas of your shirt or coat, into a tissue or into your sleeve (not your hands). . Avoid shaking hands with others and consider head nods or verbal greetings only. . Avoid touching your eyes, nose, or mouth with unwashed hands.  . Avoid close contact with people who are sick. . Avoid places or events with large numbers of people in one location, like concerts or sporting events. . Carefully consider travel plans you have or are making. . If you are planning any travel outside or inside the [place], visit the CDC's Travelers' Health webpage for the latest health notices. . If you have some symptoms but not all symptoms, continue to monitor at home and seek medical attention if your symptoms worsen. . If you are having a medical emergency, call 911.   [place] / e-Visit: [email or web address]         [facility]:  [facility]: [phone]                   [facility]: [phone]

## 2019-11-06 LAB — TYPE AND SCREEN
ABO/RH(D): A POS
Antibody Screen: NEGATIVE
Unit division: 0
Unit division: 0

## 2019-11-06 LAB — BPAM RBC
Blood Product Expiration Date: 202101112359
Blood Product Expiration Date: 202101112359
ISSUE DATE / TIME: 202012211454
ISSUE DATE / TIME: 202012231516
Unit Type and Rh: 6200
Unit Type and Rh: 6200

## 2019-11-09 DIAGNOSIS — C3432 Malignant neoplasm of lower lobe, left bronchus or lung: Secondary | ICD-10-CM | POA: Diagnosis not present

## 2019-11-10 ENCOUNTER — Encounter (HOSPITAL_COMMUNITY): Payer: Self-pay | Admitting: *Deleted

## 2019-11-10 ENCOUNTER — Other Ambulatory Visit: Payer: Self-pay

## 2019-11-10 ENCOUNTER — Telehealth: Payer: Self-pay | Admitting: Family Medicine

## 2019-11-10 ENCOUNTER — Inpatient Hospital Stay: Payer: No Typology Code available for payment source

## 2019-11-10 ENCOUNTER — Telehealth: Payer: Self-pay | Admitting: Medical Oncology

## 2019-11-10 ENCOUNTER — Emergency Department (HOSPITAL_COMMUNITY)
Admission: EM | Admit: 2019-11-10 | Discharge: 2019-11-11 | Disposition: A | Discharge status: Home / Self Care | Payer: No Typology Code available for payment source | Source: Home / Self Care | Attending: Emergency Medicine | Admitting: Emergency Medicine

## 2019-11-10 ENCOUNTER — Ambulatory Visit
Admission: RE | Admit: 2019-11-10 | Discharge: 2019-11-10 | Disposition: A | Discharge status: Home / Self Care | Payer: No Typology Code available for payment source | Source: Ambulatory Visit | Attending: Radiation Oncology | Admitting: Radiation Oncology

## 2019-11-10 ENCOUNTER — Other Ambulatory Visit: Payer: Self-pay | Admitting: Internal Medicine

## 2019-11-10 ENCOUNTER — Other Ambulatory Visit: Payer: Self-pay | Admitting: Medical Oncology

## 2019-11-10 ENCOUNTER — Emergency Department (HOSPITAL_COMMUNITY): Payer: No Typology Code available for payment source

## 2019-11-10 VITALS — BP 111/77 | HR 100 | Temp 98.6°F | Resp 20 | Wt 158.5 lb

## 2019-11-10 DIAGNOSIS — Z7982 Long term (current) use of aspirin: Secondary | ICD-10-CM | POA: Insufficient documentation

## 2019-11-10 DIAGNOSIS — C3492 Malignant neoplasm of unspecified part of left bronchus or lung: Secondary | ICD-10-CM

## 2019-11-10 DIAGNOSIS — Y92018 Other place in single-family (private) house as the place of occurrence of the external cause: Secondary | ICD-10-CM | POA: Insufficient documentation

## 2019-11-10 DIAGNOSIS — Z79899 Other long term (current) drug therapy: Secondary | ICD-10-CM | POA: Insufficient documentation

## 2019-11-10 DIAGNOSIS — I5032 Chronic diastolic (congestive) heart failure: Secondary | ICD-10-CM | POA: Insufficient documentation

## 2019-11-10 DIAGNOSIS — S0990XA Unspecified injury of head, initial encounter: Secondary | ICD-10-CM | POA: Diagnosis not present

## 2019-11-10 DIAGNOSIS — C3432 Malignant neoplasm of lower lobe, left bronchus or lung: Secondary | ICD-10-CM | POA: Diagnosis not present

## 2019-11-10 DIAGNOSIS — Y9301 Activity, walking, marching and hiking: Secondary | ICD-10-CM | POA: Insufficient documentation

## 2019-11-10 DIAGNOSIS — Z8546 Personal history of malignant neoplasm of prostate: Secondary | ICD-10-CM | POA: Insufficient documentation

## 2019-11-10 DIAGNOSIS — S199XXA Unspecified injury of neck, initial encounter: Secondary | ICD-10-CM | POA: Diagnosis not present

## 2019-11-10 DIAGNOSIS — R52 Pain, unspecified: Secondary | ICD-10-CM | POA: Diagnosis not present

## 2019-11-10 DIAGNOSIS — C3490 Malignant neoplasm of unspecified part of unspecified bronchus or lung: Secondary | ICD-10-CM | POA: Insufficient documentation

## 2019-11-10 DIAGNOSIS — W19XXXA Unspecified fall, initial encounter: Secondary | ICD-10-CM | POA: Diagnosis not present

## 2019-11-10 DIAGNOSIS — R000 Tachycardia, unspecified: Secondary | ICD-10-CM | POA: Diagnosis not present

## 2019-11-10 DIAGNOSIS — Z66 Do not resuscitate: Secondary | ICD-10-CM | POA: Insufficient documentation

## 2019-11-10 DIAGNOSIS — S0003XA Contusion of scalp, initial encounter: Secondary | ICD-10-CM

## 2019-11-10 DIAGNOSIS — I132 Hypertensive heart and chronic kidney disease with heart failure and with stage 5 chronic kidney disease, or end stage renal disease: Secondary | ICD-10-CM | POA: Insufficient documentation

## 2019-11-10 DIAGNOSIS — W01198A Fall on same level from slipping, tripping and stumbling with subsequent striking against other object, initial encounter: Secondary | ICD-10-CM | POA: Insufficient documentation

## 2019-11-10 DIAGNOSIS — Y999 Unspecified external cause status: Secondary | ICD-10-CM | POA: Insufficient documentation

## 2019-11-10 DIAGNOSIS — Z87891 Personal history of nicotine dependence: Secondary | ICD-10-CM | POA: Insufficient documentation

## 2019-11-10 DIAGNOSIS — N186 End stage renal disease: Secondary | ICD-10-CM | POA: Insufficient documentation

## 2019-11-10 DIAGNOSIS — J449 Chronic obstructive pulmonary disease, unspecified: Secondary | ICD-10-CM | POA: Insufficient documentation

## 2019-11-10 DIAGNOSIS — Z992 Dependence on renal dialysis: Secondary | ICD-10-CM | POA: Insufficient documentation

## 2019-11-10 DIAGNOSIS — Z7901 Long term (current) use of anticoagulants: Secondary | ICD-10-CM | POA: Insufficient documentation

## 2019-11-10 LAB — BASIC METABOLIC PANEL
Anion gap: 16 — ABNORMAL HIGH (ref 5–15)
BUN: 38 mg/dL — ABNORMAL HIGH (ref 8–23)
CO2: 27 mmol/L (ref 22–32)
Calcium: 8.6 mg/dL — ABNORMAL LOW (ref 8.9–10.3)
Chloride: 97 mmol/L — ABNORMAL LOW (ref 98–111)
Creatinine, Ser: 4.81 mg/dL — ABNORMAL HIGH (ref 0.61–1.24)
GFR calc Af Amer: 12 mL/min — ABNORMAL LOW (ref >60)
GFR calc non Af Amer: 11 mL/min — ABNORMAL LOW (ref >60)
Glucose, Bld: 142 mg/dL — ABNORMAL HIGH (ref 70–99)
Potassium: 4.7 mmol/L (ref 3.5–5.1)
Sodium: 140 mmol/L (ref 135–145)

## 2019-11-10 LAB — CBC WITH DIFFERENTIAL (CANCER CENTER ONLY)
Abs Immature Granulocytes: 0.07 10*3/uL (ref 0.00–0.07)
Basophils Absolute: 0 10*3/uL (ref 0.0–0.1)
Basophils Relative: 1 %
Eosinophils Absolute: 0 10*3/uL (ref 0.0–0.5)
Eosinophils Relative: 1 %
HCT: 33.1 % — ABNORMAL LOW (ref 39.0–52.0)
Hemoglobin: 10.4 g/dL — ABNORMAL LOW (ref 13.0–17.0)
Immature Granulocytes: 2 %
Lymphocytes Relative: 27 %
Lymphs Abs: 1.2 10*3/uL (ref 0.7–4.0)
MCH: 31 pg (ref 26.0–34.0)
MCHC: 31.4 g/dL (ref 30.0–36.0)
MCV: 98.8 fL (ref 80.0–100.0)
Monocytes Absolute: 0.9 10*3/uL (ref 0.1–1.0)
Monocytes Relative: 20 %
Neutro Abs: 2.1 10*3/uL (ref 1.7–7.7)
Neutrophils Relative %: 49 %
Platelet Count: 229 10*3/uL (ref 150–400)
RBC: 3.35 MIL/uL — ABNORMAL LOW (ref 4.22–5.81)
RDW: 17 % — ABNORMAL HIGH (ref 11.5–15.5)
WBC Count: 4.3 10*3/uL (ref 4.0–10.5)
nRBC: 0 % (ref 0.0–0.2)

## 2019-11-10 LAB — CMP (CANCER CENTER ONLY)
ALT: 18 U/L (ref 0–44)
AST: 19 U/L (ref 15–41)
Albumin: 2.4 g/dL — ABNORMAL LOW (ref 3.5–5.0)
Alkaline Phosphatase: 164 U/L — ABNORMAL HIGH (ref 38–126)
Anion gap: 14 (ref 5–15)
BUN: 32 mg/dL — ABNORMAL HIGH (ref 8–23)
CO2: 29 mmol/L (ref 22–32)
Calcium: 8.7 mg/dL — ABNORMAL LOW (ref 8.9–10.3)
Chloride: 99 mmol/L (ref 98–111)
Creatinine: 4.35 mg/dL (ref 0.61–1.24)
GFR, Est AFR Am: 14 mL/min — ABNORMAL LOW (ref >60)
GFR, Estimated: 12 mL/min — ABNORMAL LOW (ref >60)
Glucose, Bld: 94 mg/dL (ref 70–99)
Potassium: 4.2 mmol/L (ref 3.5–5.1)
Sodium: 142 mmol/L (ref 135–145)
Total Bilirubin: 0.4 mg/dL (ref 0.3–1.2)
Total Protein: 6.1 g/dL — ABNORMAL LOW (ref 6.5–8.1)

## 2019-11-10 LAB — CBC
HCT: 34.6 % — ABNORMAL LOW (ref 39.0–52.0)
Hemoglobin: 10.5 g/dL — ABNORMAL LOW (ref 13.0–17.0)
MCH: 30.4 pg (ref 26.0–34.0)
MCHC: 30.3 g/dL (ref 30.0–36.0)
MCV: 100.3 fL — ABNORMAL HIGH (ref 80.0–100.0)
Platelets: 237 10*3/uL (ref 150–400)
RBC: 3.45 MIL/uL — ABNORMAL LOW (ref 4.22–5.81)
RDW: 17 % — ABNORMAL HIGH (ref 11.5–15.5)
WBC: 4 10*3/uL (ref 4.0–10.5)
nRBC: 0 % (ref 0.0–0.2)

## 2019-11-10 LAB — PROTIME-INR
INR: 2.5 — ABNORMAL HIGH (ref 0.8–1.2)
Prothrombin Time: 26.5 seconds — ABNORMAL HIGH (ref 11.4–15.2)

## 2019-11-10 MED ORDER — SODIUM CHLORIDE 0.9 % IV SOLN
20.0000 mg | Freq: Once | INTRAVENOUS | Status: AC
  Administered 2019-11-10: 20 mg via INTRAVENOUS
  Filled 2019-11-10: qty 20

## 2019-11-10 MED ORDER — FAMOTIDINE IN NACL 20-0.9 MG/50ML-% IV SOLN
INTRAVENOUS | Status: AC
  Filled 2019-11-10: qty 50

## 2019-11-10 MED ORDER — HYDROCODONE-ACETAMINOPHEN 5-325 MG PO TABS
2.0000 | ORAL_TABLET | Freq: Once | ORAL | Status: AC
  Administered 2019-11-10: 2 via ORAL
  Filled 2019-11-10: qty 2

## 2019-11-10 MED ORDER — PALONOSETRON HCL INJECTION 0.25 MG/5ML
INTRAVENOUS | Status: AC
  Filled 2019-11-10: qty 5

## 2019-11-10 MED ORDER — SODIUM CHLORIDE 0.9 % IV SOLN
100.0000 mg | Freq: Once | INTRAVENOUS | Status: AC
  Administered 2019-11-10: 100 mg via INTRAVENOUS
  Filled 2019-11-10: qty 10

## 2019-11-10 MED ORDER — DIPHENHYDRAMINE HCL 50 MG/ML IJ SOLN
50.0000 mg | Freq: Once | INTRAMUSCULAR | Status: AC
  Administered 2019-11-10: 16:00:00 50 mg via INTRAVENOUS

## 2019-11-10 MED ORDER — SODIUM CHLORIDE 0.9 % IV SOLN
45.0000 mg/m2 | Freq: Once | INTRAVENOUS | Status: AC
  Administered 2019-11-10: 84 mg via INTRAVENOUS
  Filled 2019-11-10: qty 14

## 2019-11-10 MED ORDER — PALONOSETRON HCL INJECTION 0.25 MG/5ML
0.2500 mg | Freq: Once | INTRAVENOUS | Status: AC
  Administered 2019-11-10: 0.25 mg via INTRAVENOUS

## 2019-11-10 MED ORDER — DIPHENHYDRAMINE HCL 50 MG/ML IJ SOLN
INTRAMUSCULAR | Status: AC
  Filled 2019-11-10: qty 1

## 2019-11-10 MED ORDER — SODIUM CHLORIDE 0.9 % IV SOLN
Freq: Once | INTRAVENOUS | Status: AC
  Filled 2019-11-10: qty 250

## 2019-11-10 MED ORDER — FAMOTIDINE IN NACL 20-0.9 MG/50ML-% IV SOLN
20.0000 mg | Freq: Once | INTRAVENOUS | Status: AC
  Administered 2019-11-10: 16:00:00 20 mg via INTRAVENOUS

## 2019-11-10 MED ORDER — BACITRACIN ZINC 500 UNIT/GM EX OINT
1.0000 "application " | TOPICAL_OINTMENT | Freq: Two times a day (BID) | CUTANEOUS | Status: DC
  Administered 2019-11-10: 1 via TOPICAL
  Filled 2019-11-10: qty 1.8

## 2019-11-10 NOTE — Telephone Encounter (Signed)
Covering PCP advise

## 2019-11-10 NOTE — Telephone Encounter (Signed)
Son aware and verbalizes understanding.  States that he has never went to [facility] that he goes to [place] [facility].

## 2019-11-10 NOTE — Patient Instructions (Signed)
[facility] Discharge Instructions for Patients Receiving Chemotherapy  Today you received the following chemotherapy agents Paclitaxel (TAXOL) & Carboplatin (PARAPLATIN).  To help prevent nausea and vomiting after your treatment, we encourage you to take your nausea medication as prescribed.   If you develop nausea and vomiting that is not controlled by your nausea medication, call the clinic.   BELOW ARE SYMPTOMS THAT SHOULD BE REPORTED IMMEDIATELY:  *FEVER GREATER THAN 100.5 F  *CHILLS WITH OR WITHOUT FEVER  NAUSEA AND VOMITING THAT IS NOT CONTROLLED WITH YOUR NAUSEA MEDICATION  *UNUSUAL SHORTNESS OF BREATH  *UNUSUAL BRUISING OR BLEEDING  TENDERNESS IN MOUTH AND THROAT WITH OR WITHOUT PRESENCE OF ULCERS  *URINARY PROBLEMS  *BOWEL PROBLEMS  UNUSUAL RASH Items with * indicate a potential emergency and should be followed up as soon as possible.  Feel free to call the clinic should you have any questions or concerns. The clinic phone number is [phone].  Please show the CHEMO ALERT CARD at check-in to the Emergency Department and triage nurse.  Coronavirus (COVID-19) Are you at risk?  Are you at risk for the Coronavirus (COVID-19)?  To be considered HIGH RISK for Coronavirus (COVID-19), you have to meet the following criteria:  . Traveled to [place], [place], [place], [place] or [place]; or in the [place] to [place], [place], [place], or [place]; and have fever, cough, and shortness of breath within the last 2 weeks of travel OR . Been in close contact with a person diagnosed with COVID-19 within the last 2 weeks and have fever, cough, and shortness of breath . IF YOU DO NOT MEET THESE CRITERIA, YOU ARE CONSIDERED LOW RISK FOR COVID-19.  What to do if you are HIGH RISK for COVID-19?  . If you are having a medical emergency, call 911. . Seek medical care right away. Before you go to a doctor's office, urgent care or emergency department,  call ahead and tell them about your recent travel, contact with someone diagnosed with COVID-19, and your symptoms. You should receive instructions from your physician's office regarding next steps of care.  . When you arrive at healthcare provider, tell the healthcare staff immediately you have returned from visiting [place], [place], [place], [place] or [place]; or traveled in the [place] to [place], [place], [place], or [place]; in the last two weeks or you have been in close contact with a person diagnosed with COVID-19 in the last 2 weeks.   . Tell the health care staff about your symptoms: fever, cough and shortness of breath. . After you have been seen by a medical provider, you will be either: o Tested for (COVID-19) and discharged home on quarantine except to seek medical care if symptoms worsen, and asked to  - Stay home and avoid contact with others until you get your results (4-5 days)  - Avoid travel on public transportation if possible (such as bus, train, or airplane) or o Sent to the Emergency Department by EMS for evaluation, COVID-19 testing, and possible admission depending on your condition and test results.  What to do if you are LOW RISK for COVID-19?  Reduce your risk of any infection by using the same precautions used for avoiding the common cold or flu:  . Wash your hands often with soap and warm water for at least 20 seconds.  If soap and water are not readily available, use an alcohol-based hand sanitizer with at least 60% alcohol.  .   If coughing or sneezing, cover your mouth and nose by coughing or sneezing into the elbow areas of your shirt or coat, into a tissue or into your sleeve (not your hands). . Avoid shaking hands with others and consider head nods or verbal greetings only. . Avoid touching your eyes, nose, or mouth with unwashed hands.  . Avoid close contact with people who are sick. . Avoid places or events with large numbers of people in one  location, like concerts or sporting events. . Carefully consider travel plans you have or are making. . If you are planning any travel outside or inside the [place], visit the CDC's Travelers' Health webpage for the latest health notices. . If you have some symptoms but not all symptoms, continue to monitor at home and seek medical attention if your symptoms worsen. . If you are having a medical emergency, call 911.   ADDITIONAL HEALTHCARE OPTIONS FOR PATIENTS  [facility] / e-Visit: [email or web address]         [facility]: [phone]  [facility]: [phone]                   [facility]: [phone]   

## 2019-11-10 NOTE — Progress Notes (Signed)
Verbal order from Dr. Julien Nordmann: Don [name] to treat patient with Scr. of 4.35

## 2019-11-10 NOTE — Telephone Encounter (Signed)
Ok to increase to 3mg .

## 2019-11-10 NOTE — Discharge Instructions (Signed)
Your testing shows no signs of brain injury, no signs of fractures of the bones of your head or your neck, your blood work was reassuring, it does appear that you are still taking warfarin.  I would please have that discussion with your doctor about whether you need to keep taking it.  Please keep your wounds covered with a topical antibiotic ointment and a sterile dressing, seek medical exam for severe or worsening symptoms.

## 2019-11-10 NOTE — ED Provider Notes (Signed)
[facility] EMERGENCY DEPARTMENT Provider Note   CSN: [id number] Arrival date & time: 11/10/19  2055     History Chief Complaint  Patient presents with  . Fall    Don [name] is a 78 y.o. male.  HPI   This patient is an 78 year old male, he has a known history of anemia, he has a history of atrial fibrillation and COPD.  He is on dialysis Tuesday Thursdays and Saturdays.  He reports that he is currently undergoing radiation and chemotherapy, was in his usual state of health when he lost his balance trying to walk up the wheelchair ramp at his house.  He could not grab onto the rail or his cane and fell backwards striking his left side of the head into the wall, he did not lose consciousness, he denies any pain in his shoulders back neck legs or hips.  He does have chronic shortness of breath and chest pain related to his underlying disease.  He had some mild bleeding and was placed in a cervical collar prehospital by paramedics.  He states he no longer takes the warfarin that he was on in the past.  Past Medical History:  Diagnosis Date  . Acute bronchitis 04/03/2014  . Anemia    low iron  . Anxiety   . Atrial fibrillation ([place])   . Cataract   . COPD (chronic obstructive pulmonary disease) ([place])   . Dementia ([place])   . Depression   . Dyspnea    with activity  . 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 Essential hypertension   . Hyperlipidemia   . Lung cancer ([place])   . Macular degeneration   . Noncompliance with medications 04/2013   Xarelto, digoxin previously  . Polyposis coli   . Pre-diabetes   . Prostate cancer ([place])   . Tubular adenoma of colon 07/31/02, 11/18/03    Patient Active Problem List   Diagnosis Date Noted  . Genetic testing 10/16/2019  . 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 Encounter for antineoplastic chemotherapy 10/03/2019  . S/P bronchoscopy   . Polyposis coli   . ESRD (end  stage renal disease) ([place]) 10/30/2018  . COPD with acute exacerbation ([place]) 10/30/2018  . Acute on chronic diastolic CHF (congestive heart failure) ([place]) 10/30/2018  . Hemodialysis patient ([place])   . Acute renal failure superimposed on stage 5 chronic kidney disease, not on chronic dialysis ([place])   . Avulsion of skin of right forearm   . Fall   . Palliative care by specialist   . Goals of care, counseling/discussion   . DNR (do not resuscitate) discussion   . Atrial fibrillation with RVR ([place]) 03/18/2018  . Mass of lower lobe of left lung 02/12/2018  . Depression, recurrent ([place]) 01/02/2018  . Acute on chronic anemia   . Normocytic anemia   . Coagulopathy ([place]) 01/31/2017  . Pancytopenia ([place]) 01/31/2017  . Pre-diabetes 07/23/2015  . Metabolic syndrome 62/26/3335  . Urinary bladder incontinence 03/04/2015  . Hyperlipidemia LDL goal <130 04/04/2014  . Personal history of colonic polyps 04/04/2014  . Heme positive stool 04/03/2014  . History of prostate cancer 04/03/2014  . Tobacco abuse 04/25/2013  . Prostate cancer ([place]) 02/08/2011  . COPD (chronic obstructive pulmonary disease) ([place]) 02/08/2011  . Hypertension 02/08/2011    Past Surgical History:  Procedure Laterality Date  . A/V FISTULAGRAM Left 05/30/2019   Procedure: A/V FISTULAGRAM;  Surgeon:  Elam Dutch, MD;  Location: [place] [facility] LAB;  Service: Cardiovascular;  Laterality: Left;  . Anal abcess,Hemorroids,    . AV FISTULA PLACEMENT Left 06/23/2019   Procedure: INSERTION OF ARTERIOVENOUS (AV) GORE-TEX GRAFT ARM;  Surgeon: Elam Dutch, MD;  Location: [place];  Service: Vascular;  Laterality: Left;  . BASCILIC VEIN TRANSPOSITION Left 10/08/2018   Procedure: FIRST STAGE BASILIC VEIN TRANSPOSITION LEFT ARM;  Surgeon: Angelia Mould, MD;  Location: [place];  Service: Vascular;  Laterality: Left;  . BASCILIC VEIN TRANSPOSITION Left 12/16/2018   Procedure: BASILIC VEIN TRANSPOSITION SECOND STAGE;  Surgeon: Angelia Mould, MD;  Location: [facility] OR;  Service: Vascular;  Laterality: Left;  . COLONOSCOPY N/A 04/05/2014   Dr. Fuller Plan: 6 mm sessile polyp from sigmoid colon (tubular adenoma), internal hemorrhoids  . COLONOSCOPY W/ BIOPSIES  11/18/2003   Dr. Earle Gell  . ESOPHAGOGASTRODUODENOSCOPY  11/18/2003   Dr. Earle Gell  . ESOPHAGOGASTRODUODENOSCOPY N/A 04/05/2014   Dr. Fuller Plan: variable Z line, negative Barrett's, small hiatal hernia  . ESOPHAGOGASTRODUODENOSCOPY N/A 02/02/2017   Dr. Oneida Alar: LA Grade B esophagitis, one moderate benign-appearing intrinsic stenosis traversed, small non-bleeding diverticulum in second portion of duodenum, reactive gastropathy, no H.pylori.  . ESOPHAGOGASTRODUODENOSCOPY N/A 03/22/2018   Procedure: ESOPHAGOGASTRODUODENOSCOPY (EGD);  Surgeon: Daneil Dolin, MD;  [facility];  Service: Endoscopy;  Laterality: N/A;  . GIVENS CAPSULE STUDY N/A 03/22/2018   Procedure: GIVENS CAPSULE STUDY;  Surgeon: Daneil Dolin, MD;  [facility];  Service: Endoscopy;  Laterality: N/A;  . IR FLUORO GUIDE CV LINE RIGHT  10/29/2018  . IR REMOVAL TUN CV CATH W/O FL  08/06/2019  . IR US GUIDE VASC ACCESS RIGHT  10/29/2018  . PERIPHERAL VASCULAR BALLOON ANGIOPLASTY Left 03/14/2019   Procedure: PERIPHERAL VASCULAR BALLOON ANGIOPLASTY;  Surgeon: Angelia Mould, MD;  Location: [place] CV LAB;  Service: Cardiovascular;  Laterality: Left;  . PERIPHERAL VASCULAR BALLOON ANGIOPLASTY  05/30/2019   Procedure: PERIPHERAL VASCULAR BALLOON ANGIOPLASTY;  Surgeon: Elam Dutch, MD;  Location: [place] CV LAB;  Service: Cardiovascular;;  left arm fistula  . RETROPUBIC PROSTATECTOMY  11/26/2001  . TONSILLECTOMY    . VIDEO BRONCHOSCOPY WITH ENDOBRONCHIAL NAVIGATION N/A 09/26/2019   Procedure: VIDEO BRONCHOSCOPY WITH ENDOBRONCHIAL NAVIGATION;  Surgeon: Garner Nash, DO;  Location: [place];  Service: Thoracic;  Laterality: N/A;  . VIDEO BRONCHOSCOPY WITH ENDOBRONCHIAL  ULTRASOUND N/A 09/26/2019   Procedure: VIDEO BRONCHOSCOPY WITH ENDOBRONCHIAL ULTRASOUND;  Surgeon: Garner Nash, DO;  Location: [facility] OR;  Service: Thoracic;  Laterality: N/A;       Family History  Problem Relation Age of Onset  . Diabetes Father   . Heart disease Father 48       MI  . Heart attack Father   . Heart attack Mother   . Heart disease Brother   . Cancer Brother        lung  . Lung cancer Brother   . Heart disease Brother   . Cancer Brother        possibly riddled with cancer  . Lung cancer Sister   . Cancer Sister        lung  . Heart disease Brother   . Lung cancer Brother   . Other Brother 74       accident  . Heart disease Brother   . Lung cancer Nephew     Social History   Tobacco Use  . Smoking status: Former  Smoker    Packs/day: 0.50    Years: 62.00    Pack years: 31.00    Types: Cigarettes, Cigars    Quit date: 10/27/2018    Years since quitting: 1.0  . Smokeless tobacco: Never Used  . Tobacco comment: "very little"  Substance Use Topics  . Alcohol use: No  . Drug use: No    Home Medications Prior to Admission medications   Medication Sig Start Date End Date Taking? Authorizing Provider  albuterol (PROAIR HFA) 108 (90 Base) MCG/ACT inhaler Inhale 2 puffs into the lungs 2 (two) times daily.     [provider]  aspirin EC 81 MG tablet Take 243 mg by mouth once as needed for moderate pain (AS ADVISED BY ems).     [provider]  atorvastatin (LIPITOR) 20 MG tablet Take 1 tablet (20 mg total) by mouth daily. (Needs to be seen) Patient taking differently: Take 20 mg by mouth at bedtime.  10/08/19   Dettinger, Fransisca Kaufmann, MD  bisoprolol (ZEBETA) 10 MG tablet TAKE (1) TABLET TWICE A DAY. Patient taking differently: Take 10 mg by mouth every morning.  10/18/18   Dettinger, Fransisca Kaufmann, MD  budesonide-formoterol (SYMBICORT) 160-4.5 MCG/ACT inhaler Inhale 2 puffs into the lungs 2 (two) times daily.    [provider]    cephALEXin (KEFLEX) 500 MG capsule Take 500mg  tablet 3 times per week after finishing dialysis for 10 days Patient taking differently: Take 500 mg by mouth every Tuesday, Thursday, and Saturday at 6 PM. Take 500mg  tablet 3 times per week after finishing dialysis for 10 days starting on 10/29/2019 10/29/19   Dettinger, Fransisca Kaufmann, MD  clonazePAM (KLONOPIN) 1 MG tablet TAKE 1/2 TABLET AT BEDTIME Patient taking differently: Take 0.5 mg by mouth at bedtime.  04/30/19   Dettinger, Fransisca Kaufmann, MD  diltiazem (CARDIZEM CD) 120 MG 24 hr capsule Take 1 capsule (120 mg total) by mouth daily. 04/08/19   Dettinger, Fransisca Kaufmann, MD  DULoxetine (CYMBALTA) 60 MG capsule Take 60 mg by mouth daily.    [provider]  ferrous sulfate 325 (65 FE) MG tablet Take 325 mg by mouth every Monday, Wednesday, and Friday.    [provider]  hydroxypropyl methylcellulose / hypromellose (ISOPTO TEARS / GONIOVISC) 2.5 % ophthalmic solution Place 1 drop into both eyes 2 (two) times daily as needed for dry eyes.     [provider]  iron polysaccharides (FERREX 150) 150 MG capsule Take 1 capsule (150 mg total) by mouth daily. 09/23/19   Dettinger, Fransisca Kaufmann, MD  lidocaine-prilocaine (EMLA) cream Apply 1 application topically as needed. Patient taking differently: Apply 1 application topically every Tuesday, Thursday, and Saturday at 6 PM.  06/03/19   Dettinger, Fransisca Kaufmann, MD  loratadine (CLARITIN) 10 MG tablet TAKE 1 TABLET DAILY Patient taking differently: Take 10 mg by mouth daily.  07/09/19   Dettinger, Fransisca Kaufmann, MD  Melatonin 3 MG TABS Take 6 mg by mouth at bedtime. Recently increased to 6 mg per POA    [provider]  Multiple Vitamins-Minerals (MENS ONE DAILY PO) Take 1 tablet by mouth at bedtime.     [provider]  Multiple Vitamins-Minerals (PRESERVISION AREDS 2+MULTI VIT) CAPS Take 1 capsule by mouth 2 (two) times daily.    [provider]  Nutritional Supplements (FEEDING  SUPPLEMENT, NEPRO CARB STEADY,) LIQD Take 237 mLs by mouth 2 (two) times daily between meals. 10/31/18   Orson Eva, MD  Omega-3 Fatty Acids (  FISH OIL) 1000 MG CAPS Take 2 capsules by mouth 2 (two) times daily.    [provider]  pantoprazole (PROTONIX) 40 MG tablet TAKE (1) TABLET TWICE A DAY. Patient taking differently: Take 40 mg by mouth 2 (two) times daily.  10/03/19   Dettinger, Fransisca Kaufmann, MD  prochlorperazine (COMPAZINE) 10 MG tablet Take 1 tablet (10 mg total) by mouth every 6 (six) hours as needed for nausea or vomiting. 10/03/19   Heilingoetter, Cassandra L, PA-C  terbinafine (LAMISIL) 250 MG tablet Take 3 times weekly as directed on dialysis day after dialysis Patient taking differently: Take 250 mg by mouth every Tuesday, Thursday, and Saturday at 6 PM. Take 3 times weekly as directed on dialysis day after dialysis 09/18/19   Dettinger, Fransisca Kaufmann, MD  tiotropium (SPIRIVA) 18 MCG inhalation capsule Place 18 mcg into inhaler and inhale daily.     [provider]  triamcinolone cream (KENALOG) 0.1 % Apply 1 application topically daily as needed (for itching/skin irritation).  06/13/19   [provider]  warfarin (COUMADIN) 2 MG tablet TAKE 2 TABLETS (4MG ) DAILY EXCEPT ON MONDAY AND THURS TAKE 2 & 1/2 (5MG ) Patient taking differently: Take 4 mg by mouth See admin instructions. TAKE 2 TABLETS (4MG ) DAILY EXCEPT ON MONDAY AND THURS TAKE 2 & 1/2 (5MG ) 10/08/19   Dettinger, Fransisca Kaufmann, MD    Allergies    Wellbutrin [bupropion]  Review of Systems   Review of Systems  All other systems reviewed and are negative.   Physical Exam Updated Vital Signs BP 126/78   Pulse (!) 115   Temp 97.6 F (36.4 C) (Oral)   Resp 18   SpO2 94%   Physical Exam Vitals and nursing note reviewed.  Constitutional:      General: He is not in acute distress.    Appearance: He is well-developed.  HENT:     Head: Normocephalic.     Comments: 3 small hematomas to the left side of the  scalp, no discrete lacerations    Nose: Nose normal.     Mouth/Throat:     Mouth: Mucous membranes are moist.     Pharynx: No oropharyngeal exudate.  Eyes:     General: No scleral icterus.       Right eye: No discharge.        Left eye: No discharge.     Conjunctiva/sclera: Conjunctivae normal.     Pupils: Pupils are equal, round, and reactive to light.  Neck:     Thyroid: No thyromegaly.     Vascular: No JVD.  Cardiovascular:     Rate and Rhythm: Tachycardia present. Rhythm irregular.     Heart sounds: Normal heart sounds. No murmur. No friction rub. No gallop.   Pulmonary:     Effort: Pulmonary effort is normal. No respiratory distress.     Breath sounds: Normal breath sounds. No wheezing or rales.  Abdominal:     General: Bowel sounds are normal. There is no distension.     Palpations: Abdomen is soft. There is no mass.     Tenderness: There is no abdominal tenderness.  Musculoskeletal:        General: No tenderness. Normal range of motion.     Cervical back: Normal range of motion and neck supple.  Lymphadenopathy:     Cervical: No cervical adenopathy.  Skin:    General: Skin is warm and dry.     Findings: No erythema or rash.  Neurological:  Mental Status: He is alert.     Coordination: Coordination normal.     Comments: Normal speech coordination and movement of all 4 extremities,  Psychiatric:        Behavior: Behavior normal.     ED Results / Procedures / Treatments   Labs (all labs ordered are listed, but only abnormal results are displayed) Labs Reviewed  PROTIME-INR - Abnormal; Notable for the following components:      Result Value   Prothrombin Time 26.5 (*)    INR 2.5 (*)    All other components within normal limits  CBC - Abnormal; Notable for the following components:   RBC 3.45 (*)    Hemoglobin 10.5 (*)    HCT 34.6 (*)    MCV 100.3 (*)    RDW 17.0 (*)    All other components within normal limits  BASIC METABOLIC PANEL - Abnormal; Notable  for the following components:   Chloride 97 (*)    Glucose, Bld 142 (*)    BUN 38 (*)    Creatinine, Ser 4.81 (*)    Calcium 8.6 (*)    GFR calc non Af Amer 11 (*)    GFR calc Af Amer 12 (*)    Anion gap 16 (*)    All other components within normal limits    EKG None  Radiology CT Head Wo Contrast  Result Date: 11/10/2019 CLINICAL DATA:  [place] and hit head on brick wall laceration to back of head EXAM: CT HEAD WITHOUT CONTRAST CT CERVICAL SPINE WITHOUT CONTRAST TECHNIQUE: Multidetector CT imaging of the head and cervical spine was performed following the standard protocol without intravenous contrast. Multiplanar CT image reconstructions of the cervical spine were also generated. COMPARISON:  CT brain 10/30/2019, MRI 10/15/2019 FINDINGS: CT HEAD FINDINGS Brain: No acute territorial infarction, hemorrhage, or intracranial mass. Moderate atrophy. Moderate hypodensity within the bilateral white matter consistent with chronic small vessel ischemic change. Stable ventricle size. Vascular: No hyperdense vessels.  Carotid vascular calcification Skull: No fracture Sinuses/Orbits: Mucosal thickening in the left sphenoid sinus Other: Mild posterior scalp swelling. CT CERVICAL SPINE FINDINGS Alignment: 3 mm anterolisthesis C6 on C7, 2 mm anterolisthesis C5 on C6. Exaggeration of cervical lordosis. Facet alignment is maintained. Rotation of C1 on C2 is presumably due to positioning. Skull base and vertebrae: No fracture is seen. Chronic erosion involving the tip of the dens. Soft tissues and spinal canal: No prevertebral fluid or swelling. No visible canal hematoma. Disc levels: Advanced degenerative change at the C1-C2 articulation. Diffuse degenerative changes of the cervical spine, most advanced at C7-T1. Multiple level facet degenerative change and bilateral foraminal stenosis. Upper chest: Incompletely visualized left pleural effusion. No thyroid mass. Other: None IMPRESSION: 1. No CT evidence for acute  intracranial abnormality. Atrophy and small vessel ischemic changes of the white matter 2. Trace anterolisthesis C5 on C6 and C6 on C7, suspect secondary to degenerative change. No acute fracture is seen. Multiple level degenerative changes. Chronic erosive changes at the dens. 3. Incompletely visualized left pleural effusion Electronically Signed   By: Donavan Foil M.D.   On: 11/10/2019 22:32   CT Cervical Spine Wo Contrast  Result Date: 11/10/2019 CLINICAL DATA:  [place] and hit head on brick wall laceration to back of head EXAM: CT HEAD WITHOUT CONTRAST CT CERVICAL SPINE WITHOUT CONTRAST TECHNIQUE: Multidetector CT imaging of the head and cervical spine was performed following the standard protocol without intravenous contrast. Multiplanar CT image reconstructions of the cervical spine were also  generated. COMPARISON:  CT brain 10/30/2019, MRI 10/15/2019 FINDINGS: CT HEAD FINDINGS Brain: No acute territorial infarction, hemorrhage, or intracranial mass. Moderate atrophy. Moderate hypodensity within the bilateral white matter consistent with chronic small vessel ischemic change. Stable ventricle size. Vascular: No hyperdense vessels.  Carotid vascular calcification Skull: No fracture Sinuses/Orbits: Mucosal thickening in the left sphenoid sinus Other: Mild posterior scalp swelling. CT CERVICAL SPINE FINDINGS Alignment: 3 mm anterolisthesis C6 on C7, 2 mm anterolisthesis C5 on C6. Exaggeration of cervical lordosis. Facet alignment is maintained. Rotation of C1 on C2 is presumably due to positioning. Skull base and vertebrae: No fracture is seen. Chronic erosion involving the tip of the dens. Soft tissues and spinal canal: No prevertebral fluid or swelling. No visible canal hematoma. Disc levels: Advanced degenerative change at the C1-C2 articulation. Diffuse degenerative changes of the cervical spine, most advanced at C7-T1. Multiple level facet degenerative change and bilateral foraminal stenosis. Upper chest:  Incompletely visualized left pleural effusion. No thyroid mass. Other: None IMPRESSION: 1. No CT evidence for acute intracranial abnormality. Atrophy and small vessel ischemic changes of the white matter 2. Trace anterolisthesis C5 on C6 and C6 on C7, suspect secondary to degenerative change. No acute fracture is seen. Multiple level degenerative changes. Chronic erosive changes at the dens. 3. Incompletely visualized left pleural effusion Electronically Signed   By: Donavan Foil M.D.   On: 11/10/2019 22:32    Procedures Procedures (including critical care time)  Medications Ordered in ED Medications  HYDROcodone-acetaminophen (NORCO/VICODIN) 5-325 MG per tablet 2 tablet (has no administration in time range)  bacitracin ointment 1 application (has no administration in time range)    ED Course  I have reviewed the triage vital signs and the nursing notes.  Pertinent labs & imaging results that were available during my care of the patient were reviewed by me and considered in my medical decision making (see chart for details).  Clinical Course as of Nov 09 2241  Mon Nov 10, 2019  2239 The x-rays revealed no signs of significant fracture of the spine or the skull, there is no bleeding around the brain, there is a slight effusion which is not surprising given that the patient has dialysis.  He has superficial wounds with have been dressed, he does appear to be on Coumadin based on his INR.  At this time the patient does appear stable for discharge, he has not taken his nightly medications   [BM]    Clinical Course User Index [BM] Noemi Chapel, MD   MDM Rules/Calculators/A&P                      This patient has signs of a head injury, he has some small hematomas on the side of the head, thankfully states he is no longer taking warfarin.  I will check an INR, he does have a known history of A. fib, his heart rate ranges between 101 15.  I will give him some pain medication, he is in  otherwise no distress and his neurologic exam is unremarkable.  No tenderness over the cervical spine, neurologic exam is unremarkable  Final Clinical Impression(s) / ED Diagnoses Final diagnoses:  Contusion of scalp, initial encounter  Minor head injury, initial encounter    Rx / DC Orders ED Discharge Orders    None       Noemi Chapel, MD 11/10/19 2242

## 2019-11-10 NOTE — Telephone Encounter (Signed)
CRITICAL VALUE STICKER  CRITICAL VALUE: creatinine 4.35  RECEIVER (on-site recipient of call):Evrett Hakim  DATE & TIME NOTIFIED: 11/10/2019 AT 61  MESSENGER (representative from lab):  MD NOTIFIED: MOHAMED VIA SECURE CHAT  TIME OF NOTIFICATION:1530  RESPONSE: PT ON DIALYSIS -TO BE EXPECTED.

## 2019-11-10 NOTE — ED Triage Notes (Signed)
Pt brought in by [facility] for c/o fall; pt was getting out of car and fell backwards and hit head on a retaining brick wall; ems states pt has 3 lacerations to the back of his head; pt did not lose consciousness; pt is alert and oriented;  Pt states the PCP has been changing his dose of coumadin

## 2019-11-10 NOTE — Telephone Encounter (Signed)
Talk to patient son nurse called from [facility] and patient is taking 6mg  of the melatonin and they want to increase that to 9mg  is that okay?

## 2019-11-10 NOTE — Progress Notes (Signed)
Received patient in the clinic following initial radiation treatment. Vitals are stable. Patient requesting prescription for sleep aid. Patient reports taking melatonin 3 mg and then increasing it to 6 mg without relief. Patient noted to have cardiac history and on dialysis. Explained to the patient his PCP is best to manage this need. Phoned Dr. Merita Norton office and spoke with Michelene Heady. She committed to informing Dr. Merita Norton nurse of the patient's need. Michelene Heady understands that Dr. Merita Norton nurse should reach out to the patient's son on his cell with a response. Patient wheeled out of the clinic by the patient's son in no distress.

## 2019-11-10 NOTE — Telephone Encounter (Signed)
Would recommend only increasing by 1 mg at a time.  So go up to 7mg .  Sometimes higher doses can cause mentation changes.

## 2019-11-10 NOTE — ED Notes (Signed)
Don [name] contacted for transportation.

## 2019-11-11 ENCOUNTER — Ambulatory Visit: Payer: Medicare HMO

## 2019-11-11 ENCOUNTER — Ambulatory Visit
Admission: RE | Admit: 2019-11-11 | Discharge: 2019-11-11 | Disposition: A | Discharge status: Home / Self Care | Payer: No Typology Code available for payment source | Source: Ambulatory Visit | Attending: Radiation Oncology | Admitting: Radiation Oncology

## 2019-11-11 ENCOUNTER — Other Ambulatory Visit: Payer: Medicare HMO

## 2019-11-11 NOTE — ED Notes (Signed)
Pt unable to sign due to e-sig not working

## 2019-11-11 NOTE — Telephone Encounter (Signed)
POA [name] wants to talk to nurse about pt health declining. He needs home health services. Please call back

## 2019-11-11 NOTE — Telephone Encounter (Signed)
Please advise. Son aware Dr. Warrick Parisian is off all this week.

## 2019-11-11 NOTE — Telephone Encounter (Signed)
Patient wants some skilled facility care for patient. Son states his health is declining fast.

## 2019-11-12 ENCOUNTER — Other Ambulatory Visit: Payer: Self-pay | Admitting: Family Medicine

## 2019-11-12 ENCOUNTER — Telehealth: Payer: Self-pay | Admitting: *Deleted

## 2019-11-12 ENCOUNTER — Other Ambulatory Visit: Payer: Self-pay

## 2019-11-12 ENCOUNTER — Ambulatory Visit
Admission: RE | Admit: 2019-11-12 | Discharge: 2019-11-12 | Disposition: A | Discharge status: Home / Self Care | Payer: No Typology Code available for payment source | Source: Ambulatory Visit | Attending: Radiation Oncology | Admitting: Radiation Oncology

## 2019-11-12 DIAGNOSIS — D689 Coagulation defect, unspecified: Secondary | ICD-10-CM

## 2019-11-12 DIAGNOSIS — I132 Hypertensive heart and chronic kidney disease with heart failure and with stage 5 chronic kidney disease, or end stage renal disease: Secondary | ICD-10-CM | POA: Diagnosis not present

## 2019-11-12 DIAGNOSIS — R079 Chest pain, unspecified: Secondary | ICD-10-CM | POA: Diagnosis not present

## 2019-11-12 DIAGNOSIS — G8929 Other chronic pain: Secondary | ICD-10-CM | POA: Diagnosis not present

## 2019-11-12 DIAGNOSIS — C3432 Malignant neoplasm of lower lobe, left bronchus or lung: Secondary | ICD-10-CM | POA: Diagnosis not present

## 2019-11-12 DIAGNOSIS — H3530 Unspecified macular degeneration: Secondary | ICD-10-CM | POA: Diagnosis not present

## 2019-11-12 DIAGNOSIS — I4891 Unspecified atrial fibrillation: Secondary | ICD-10-CM

## 2019-11-12 DIAGNOSIS — F0390 Unspecified dementia without behavioral disturbance: Secondary | ICD-10-CM | POA: Diagnosis not present

## 2019-11-12 DIAGNOSIS — I5033 Acute on chronic diastolic (congestive) heart failure: Secondary | ICD-10-CM | POA: Diagnosis not present

## 2019-11-12 DIAGNOSIS — N186 End stage renal disease: Secondary | ICD-10-CM | POA: Diagnosis not present

## 2019-11-12 DIAGNOSIS — J449 Chronic obstructive pulmonary disease, unspecified: Secondary | ICD-10-CM | POA: Diagnosis not present

## 2019-11-12 NOTE — Telephone Encounter (Signed)
I have placed a future order for INR to be collected on Monday during his visit with rad/onc.  Will defer to PCP to place standing orders for ongoing management of coumadin levels.

## 2019-11-12 NOTE — Telephone Encounter (Signed)
Son would like to know if PT/INRs can be done for [place] on Mondays while he is at the cancer center

## 2019-11-12 NOTE — Telephone Encounter (Signed)
Video visit made for [facility] discussion for Wed 11/19/19

## 2019-11-13 ENCOUNTER — Ambulatory Visit
Admission: RE | Admit: 2019-11-13 | Discharge: 2019-11-13 | Disposition: A | Discharge status: Home / Self Care | Payer: No Typology Code available for payment source | Source: Ambulatory Visit | Attending: Radiation Oncology | Admitting: Radiation Oncology

## 2019-11-13 ENCOUNTER — Other Ambulatory Visit: Payer: Self-pay

## 2019-11-13 DIAGNOSIS — C3432 Malignant neoplasm of lower lobe, left bronchus or lung: Secondary | ICD-10-CM | POA: Diagnosis not present

## 2019-11-13 DIAGNOSIS — C3492 Malignant neoplasm of unspecified part of left bronchus or lung: Secondary | ICD-10-CM

## 2019-11-13 MED ORDER — SONAFINE EX EMUL
1.0000 "application " | Freq: Two times a day (BID) | CUTANEOUS | Status: DC
  Administered 2019-11-13: 1 via TOPICAL

## 2019-11-14 DIAGNOSIS — F0390 Unspecified dementia without behavioral disturbance: Secondary | ICD-10-CM | POA: Diagnosis not present

## 2019-11-14 DIAGNOSIS — I4891 Unspecified atrial fibrillation: Secondary | ICD-10-CM | POA: Diagnosis not present

## 2019-11-14 DIAGNOSIS — H3530 Unspecified macular degeneration: Secondary | ICD-10-CM | POA: Diagnosis not present

## 2019-11-14 DIAGNOSIS — N186 End stage renal disease: Secondary | ICD-10-CM | POA: Diagnosis not present

## 2019-11-14 DIAGNOSIS — I132 Hypertensive heart and chronic kidney disease with heart failure and with stage 5 chronic kidney disease, or end stage renal disease: Secondary | ICD-10-CM | POA: Diagnosis not present

## 2019-11-14 DIAGNOSIS — C3490 Malignant neoplasm of unspecified part of unspecified bronchus or lung: Secondary | ICD-10-CM | POA: Diagnosis not present

## 2019-11-14 DIAGNOSIS — I5033 Acute on chronic diastolic (congestive) heart failure: Secondary | ICD-10-CM | POA: Diagnosis not present

## 2019-11-14 DIAGNOSIS — G8929 Other chronic pain: Secondary | ICD-10-CM | POA: Diagnosis not present

## 2019-11-14 DIAGNOSIS — J449 Chronic obstructive pulmonary disease, unspecified: Secondary | ICD-10-CM | POA: Diagnosis not present

## 2019-11-16 NOTE — Progress Notes (Deleted)
Cardiology Office Note   Date:  11/16/2019   ID:  Don, [name] 08-24-1941, MRN [id number]  PCP:  Dettinger, Fransisca Kaufmann, MD  Cardiologist:   No primary care provider on file.   No chief complaint on file.     History of Present Illness: Don [name] is a 79 y.o. male who presents for evaluation of atrial fib.   He was seen in 2015 and he had atrial fibrillation and rapid rate and an elevated troponin.  Initially he did not want a stress test but he eventually did have one in December 2015 and there was no evidence of ischemia.  He had suggestion of a low ejection fraction but a follow-up echo done in February 2018 demonstrated the ejection fraction to be normal.   ***    I saw him in August prior to an dialysis fistula placement.     Unfortunately it does not seem like he was scheduled to see the vascular surgeon since so that has not happened yet.  There seems to be some confusion although I did call his primary care office and we investigated and found out that a referral has been placed to vascular surgery.  The patient has been back to the [place].  I do see that he has had some blood work to follow-up anemia for which she required a transfusion after a fall in May.  His blood count is up to 10.4.  He has had no new cardiovascular complaints.  He walks with a cane.  He denies any cardiovascular symptoms such as pressure, neck or arm discomfort.  He is not noticing any palpitations, presyncope or syncope.   Past Medical History:  Diagnosis Date  . Acute bronchitis 04/03/2014  . Anemia    low iron  . Anxiety   . Atrial fibrillation ([place])   . Cataract   . COPD (chronic obstructive pulmonary disease) ([place])   . Dementia ([place])   . Depression   . Dyspnea    with activity  . ESRD (end stage renal disease) ([place]) 04/03/2014   Stage 5 Dialysis on T/Th/Sa  . Essential hypertension   . Hyperlipidemia   . Lung cancer ([place])   . Macular degeneration   . Noncompliance with  medications 04/2013   Xarelto, digoxin previously  . Polyposis coli   . Pre-diabetes   . Prostate cancer ([place])   . Tubular adenoma of colon 07/31/02, 11/18/03    Past Surgical History:  Procedure Laterality Date  . A/V FISTULAGRAM Left 05/30/2019   Procedure: A/V FISTULAGRAM;  Surgeon: Elam Dutch, MD;  Location: [facility];  Service: Cardiovascular;  Laterality: Left;  . Anal abcess,Hemorroids,    . AV FISTULA PLACEMENT Left 06/23/2019   Procedure: INSERTION OF ARTERIOVENOUS (AV) GORE-TEX GRAFT ARM;  Surgeon: Elam Dutch, MD;  Location: [facility];  Service: Vascular;  Laterality: Left;  . BASCILIC VEIN TRANSPOSITION Left 10/08/2018   Procedure: FIRST STAGE BASILIC VEIN TRANSPOSITION LEFT ARM;  Surgeon: Angelia Mould, MD;  Location: [facility];  Service: Vascular;  Laterality: Left;  . BASCILIC VEIN TRANSPOSITION Left 12/16/2018   Procedure: BASILIC VEIN TRANSPOSITION SECOND STAGE;  Surgeon: Angelia Mould, MD;  Location: [facility];  Service: Vascular;  Laterality: Left;  . COLONOSCOPY N/A 04/05/2014   Dr. Fuller Plan: 6 mm sessile polyp from sigmoid colon (tubular adenoma), internal hemorrhoids  . COLONOSCOPY W/ BIOPSIES  11/18/2003   Dr. Earle Gell  . ESOPHAGOGASTRODUODENOSCOPY  11/18/2003  Dr. Earle Gell  . ESOPHAGOGASTRODUODENOSCOPY N/A 04/05/2014   Dr. Fuller Plan: variable Z line, negative Barrett's, small hiatal hernia  . ESOPHAGOGASTRODUODENOSCOPY N/A 02/02/2017   Dr. Oneida Alar: LA Grade B esophagitis, one moderate benign-appearing intrinsic stenosis traversed, small non-bleeding diverticulum in second portion of duodenum, reactive gastropathy, no H.pylori.  . ESOPHAGOGASTRODUODENOSCOPY N/A 03/22/2018   Procedure: ESOPHAGOGASTRODUODENOSCOPY (EGD);  Surgeon: Daneil Dolin, MD;  [facility];  Service: Endoscopy;  Laterality: N/A;  . GIVENS CAPSULE STUDY N/A 03/22/2018   Procedure: GIVENS CAPSULE STUDY;  Surgeon: Daneil Dolin, MD;  [facility];   Service: Endoscopy;  Laterality: N/A;  . IR FLUORO GUIDE CV LINE RIGHT  10/29/2018  . IR REMOVAL TUN CV CATH W/O FL  08/06/2019  . IR US GUIDE VASC ACCESS RIGHT  10/29/2018  . PERIPHERAL VASCULAR BALLOON ANGIOPLASTY Left 03/14/2019   Procedure: PERIPHERAL VASCULAR BALLOON ANGIOPLASTY;  Surgeon: Angelia Mould, MD;  Location: [facility];  Service: Cardiovascular;  Laterality: Left;  . PERIPHERAL VASCULAR BALLOON ANGIOPLASTY  05/30/2019   Procedure: PERIPHERAL VASCULAR BALLOON ANGIOPLASTY;  Surgeon: Elam Dutch, MD;  Location: [facility];  Service: Cardiovascular;;  left arm fistula  . RETROPUBIC PROSTATECTOMY  11/26/2001  . TONSILLECTOMY    . VIDEO BRONCHOSCOPY WITH ENDOBRONCHIAL NAVIGATION N/A 09/26/2019   Procedure: VIDEO BRONCHOSCOPY WITH ENDOBRONCHIAL NAVIGATION;  Surgeon: Garner Nash, DO;  Location: [facility];  Service: Thoracic;  Laterality: N/A;  . VIDEO BRONCHOSCOPY WITH ENDOBRONCHIAL ULTRASOUND N/A 09/26/2019   Procedure: VIDEO BRONCHOSCOPY WITH ENDOBRONCHIAL ULTRASOUND;  Surgeon: Garner Nash, DO;  Location: [facility];  Service: Thoracic;  Laterality: N/A;     Current Outpatient Medications  Medication Sig Dispense Refill  . albuterol (PROAIR HFA) 108 (90 Base) MCG/ACT inhaler Inhale 2 puffs into the lungs 2 (two) times daily.     [name] aspirin EC 81 MG tablet Take 243 mg by mouth once as needed for moderate pain (AS ADVISED BY ems).     [name] atorvastatin (LIPITOR) 20 MG tablet Take 1 tablet (20 mg total) by mouth daily. (Needs to be seen) (Patient taking differently: Take 20 mg by mouth at bedtime. ) 90 tablet 0  . bisoprolol (ZEBETA) 10 MG tablet TAKE (1) TABLET TWICE A DAY. 180 tablet 0  . budesonide-formoterol (SYMBICORT) 160-4.5 MCG/ACT inhaler Inhale 2 puffs into the lungs 2 (two) times daily.    . cephALEXin (KEFLEX) 500 MG capsule Take 500mg  tablet 3 times per week after finishing dialysis for 10 days (Patient taking differently: Take 500 mg by mouth every  Tuesday, Thursday, and Saturday at 6 PM. Take 500mg  tablet 3 times per week after finishing dialysis for 10 days starting on 10/29/2019) 9 capsule 0  . clonazePAM (KLONOPIN) 1 MG tablet TAKE 1/2 TABLET AT BEDTIME (Patient taking differently: Take 0.5 mg by mouth at bedtime. ) 15 tablet 1  . diltiazem (CARDIZEM CD) 120 MG 24 hr capsule Take 1 capsule (120 mg total) by mouth daily. 90 capsule 3  . DULoxetine (CYMBALTA) 60 MG capsule Take 60 mg by mouth daily.    . ferrous sulfate 325 (65 FE) MG tablet Take 325 mg by mouth every Monday, Wednesday, and Friday.    . hydroxypropyl methylcellulose / hypromellose (ISOPTO TEARS / GONIOVISC) 2.5 % ophthalmic solution Place 1 drop into both eyes 2 (two) times daily as needed for dry eyes.     . iron polysaccharides (FERREX 150) 150 MG capsule Take 1 capsule (150 mg total)  by mouth daily. 90 capsule 0  . lidocaine-prilocaine (EMLA) cream Apply 1 application topically as needed. (Patient taking differently: Apply 1 application topically every Tuesday, Thursday, and Saturday at 6 PM. ) 30 g 0  . loratadine (CLARITIN) 10 MG tablet TAKE 1 TABLET DAILY (Patient taking differently: Take 10 mg by mouth daily. ) 30 tablet 11  . Melatonin 3 MG TABS Take 6 mg by mouth at bedtime. Recently increased to 6 mg per POA    . Multiple Vitamins-Minerals (MENS ONE DAILY PO) Take 1 tablet by mouth at bedtime.     . Multiple Vitamins-Minerals (PRESERVISION AREDS 2+MULTI VIT) CAPS Take 1 capsule by mouth 2 (two) times daily.    . Nutritional Supplements (FEEDING SUPPLEMENT, NEPRO CARB STEADY,) LIQD Take 237 mLs by mouth 2 (two) times daily between meals. 60 Can 0  . Omega-3 Fatty Acids (FISH OIL) 1000 MG CAPS Take 2 capsules by mouth 2 (two) times daily.    . pantoprazole (PROTONIX) 40 MG tablet TAKE (1) TABLET TWICE A DAY. (Patient taking differently: Take 40 mg by mouth 2 (two) times daily. ) 180 tablet 0  . prochlorperazine (COMPAZINE) 10 MG tablet Take 1 tablet (10 mg total) by  mouth every 6 (six) hours as needed for nausea or vomiting. 30 tablet 2  . terbinafine (LAMISIL) 250 MG tablet Take 3 times weekly as directed on dialysis day after dialysis (Patient taking differently: Take 250 mg by mouth every Tuesday, Thursday, and Saturday at 6 PM. Take 3 times weekly as directed on dialysis day after dialysis) 36 tablet 0  . tiotropium (SPIRIVA) 18 MCG inhalation capsule Place 18 mcg into inhaler and inhale daily.     [name] triamcinolone cream (KENALOG) 0.1 % Apply 1 application topically daily as needed (for itching/skin irritation).     . warfarin (COUMADIN) 2 MG tablet TAKE 2 TABLETS (4MG ) DAILY EXCEPT ON MONDAY AND THURS TAKE 2 & 1/2 (5MG ) (Patient taking differently: Take 4 mg by mouth See admin instructions. TAKE 2 TABLETS (4MG ) DAILY EXCEPT ON MONDAY AND THURS TAKE 2 & 1/2 (5MG )) 70 tablet 0   No current facility-administered medications for this visit.    Allergies:   Wellbutrin [bupropion]   ROS:  Please see the history of present illness.   Otherwise, review of systems are positive for ***.   All other systems are reviewed and negative.    PHYSICAL EXAM: VS:  There were no vitals taken for this visit. , BMI There is no height or weight on file to calculate BMI. GENERAL:  Well appearing NECK:  No jugular venous distention, waveform within normal limits, carotid upstroke brisk and symmetric, no bruits, no thyromegaly LUNGS:  Clear to auscultation bilaterally CHEST:  Unremarkable HEART:  PMI not displaced or sustained,S1 and S2 within normal limits, no S3, no clicks, no rubs, *** murmurs, irregular  ABD:  Flat, positive bowel sounds normal in frequency in pitch, no bruits, no rebound, no guarding, no midline pulsatile mass, no hepatomegaly, no splenomegaly EXT:  2 plus pulses throughout, no edema, no cyanosis no clubbing    ***GENERAL:  Well appearing NECK:  No jugular venous distention, waveform within normal limits, carotid upstroke brisk and symmetric, no  bruits, no thyromegaly LUNGS:  Clear to auscultation bilaterally CHEST:  Unremarkable HEART:  PMI not displaced or sustained,S1 and S2 within normal limits, no S3, no clicks, no rubs, no murmurs, irregular ABD:  Flat, positive bowel sounds normal in frequency in pitch, no bruits, no rebound, no  guarding, no midline pulsatile mass, no hepatomegaly, no splenomegaly EXT:  2 plus pulses throughout, no edema, no cyanosis no clubbing.   EKG:  EKG ***   Recent Labs: 11/10/2019: ALT 18; BUN 38; Creatinine, Ser 4.81; Hemoglobin 10.5; Platelets 237; Potassium 4.7; Sodium 140    Lipid Panel    Component Value Date/Time   CHOL 98 (L) 11/18/2018 0916   TRIG 38 11/18/2018 0916   TRIG 176 (H) 10/16/2013 1318   HDL 50 11/18/2018 0916   HDL 35 (L) 10/16/2013 1318   CHOLHDL 2.0 11/18/2018 0916   LDLCALC 40 11/18/2018 0916   LDLCALC 90 10/16/2013 1318      Wt Readings from Last 3 Encounters:  11/10/19 158 lb 8 oz (71.9 kg)  11/03/19 155 lb 1.6 oz (70.4 kg)  10/30/19 158 lb (71.7 kg)      Other studies Reviewed: Additional studies/ records that were reviewed today include: *** Review of the above records demonstrates:    ***   ASSESSMENT AND PLAN:   Atrial fibrillation with RVR   Don [name] has a CHA2DS2 - VASc score 3.  ***  Tolerates anticoagulation has had good rate control.  No change in therapy.  Hyperlipidemia  ***  This is [facility] at the [facility]  Hypertension  The blood pressure is *** at target.  No change in therapy.   Tobacco abuse  *** We have discussed the need to stop smoking.   Stage III CKD (chronic kidney disease)  He was being seen prior to placement of a dialysis fistula.  *** He is seen by nephrology.   Covid education ***   Current medicines are reviewed at length with the patient today.  The patient does not have concerns regarding medicines.  The following changes have been made:   ***  Labs/ tests ordered today include:  ***  No orders of  the defined types were placed in this encounter.    Disposition:   FU with me in *** year.    Signed, Minus Breeding, MD  11/16/2019 1:38 PM    [facility]

## 2019-11-17 ENCOUNTER — Inpatient Hospital Stay (HOSPITAL_BASED_OUTPATIENT_CLINIC_OR_DEPARTMENT_OTHER): Payer: No Typology Code available for payment source | Admitting: Internal Medicine

## 2019-11-17 ENCOUNTER — Telehealth: Payer: Self-pay | Admitting: Internal Medicine

## 2019-11-17 ENCOUNTER — Telehealth: Payer: Self-pay

## 2019-11-17 ENCOUNTER — Encounter: Payer: Self-pay | Admitting: Radiation Oncology

## 2019-11-17 ENCOUNTER — Other Ambulatory Visit: Payer: Self-pay | Admitting: Family Medicine

## 2019-11-17 ENCOUNTER — Encounter: Payer: Self-pay | Admitting: Internal Medicine

## 2019-11-17 ENCOUNTER — Encounter: Payer: Self-pay | Admitting: *Deleted

## 2019-11-17 ENCOUNTER — Other Ambulatory Visit: Payer: Self-pay

## 2019-11-17 ENCOUNTER — Inpatient Hospital Stay: Payer: No Typology Code available for payment source

## 2019-11-17 ENCOUNTER — Ambulatory Visit
Admission: RE | Admit: 2019-11-17 | Discharge: 2019-11-17 | Disposition: A | Discharge status: Home / Self Care | Payer: No Typology Code available for payment source | Source: Ambulatory Visit | Attending: Radiation Oncology | Admitting: Radiation Oncology

## 2019-11-17 ENCOUNTER — Other Ambulatory Visit: Payer: Medicare HMO

## 2019-11-17 VITALS — BP 104/68 | HR 89 | Temp 97.8°F | Resp 18 | Ht 69.0 in | Wt 156.0 lb

## 2019-11-17 DIAGNOSIS — D649 Anemia, unspecified: Secondary | ICD-10-CM

## 2019-11-17 DIAGNOSIS — N186 End stage renal disease: Secondary | ICD-10-CM | POA: Diagnosis not present

## 2019-11-17 DIAGNOSIS — C3492 Malignant neoplasm of unspecified part of left bronchus or lung: Secondary | ICD-10-CM

## 2019-11-17 DIAGNOSIS — I10 Essential (primary) hypertension: Secondary | ICD-10-CM | POA: Diagnosis not present

## 2019-11-17 DIAGNOSIS — Z5111 Encounter for antineoplastic chemotherapy: Secondary | ICD-10-CM

## 2019-11-17 DIAGNOSIS — D638 Anemia in other chronic diseases classified elsewhere: Secondary | ICD-10-CM | POA: Insufficient documentation

## 2019-11-17 DIAGNOSIS — I4891 Unspecified atrial fibrillation: Secondary | ICD-10-CM | POA: Diagnosis not present

## 2019-11-17 DIAGNOSIS — C3432 Malignant neoplasm of lower lobe, left bronchus or lung: Secondary | ICD-10-CM | POA: Insufficient documentation

## 2019-11-17 DIAGNOSIS — D689 Coagulation defect, unspecified: Secondary | ICD-10-CM | POA: Diagnosis not present

## 2019-11-17 DIAGNOSIS — Z510 Encounter for antineoplastic radiation therapy: Secondary | ICD-10-CM | POA: Insufficient documentation

## 2019-11-17 DIAGNOSIS — J439 Emphysema, unspecified: Secondary | ICD-10-CM | POA: Diagnosis not present

## 2019-11-17 DIAGNOSIS — Z992 Dependence on renal dialysis: Secondary | ICD-10-CM | POA: Diagnosis not present

## 2019-11-17 LAB — CBC WITH DIFFERENTIAL (CANCER CENTER ONLY)
Abs Immature Granulocytes: 0.05 10*3/uL (ref 0.00–0.07)
Basophils Absolute: 0 10*3/uL (ref 0.0–0.1)
Basophils Relative: 0 %
Eosinophils Absolute: 0 10*3/uL (ref 0.0–0.5)
Eosinophils Relative: 0 %
HCT: 28.6 % — ABNORMAL LOW (ref 39.0–52.0)
Hemoglobin: 9.1 g/dL — ABNORMAL LOW (ref 13.0–17.0)
Immature Granulocytes: 1 %
Lymphocytes Relative: 16 %
Lymphs Abs: 0.7 10*3/uL (ref 0.7–4.0)
MCH: 30.5 pg (ref 26.0–34.0)
MCHC: 31.8 g/dL (ref 30.0–36.0)
MCV: 96 fL (ref 80.0–100.0)
Monocytes Absolute: 0.2 10*3/uL (ref 0.1–1.0)
Monocytes Relative: 4 %
Neutro Abs: 3.6 10*3/uL (ref 1.7–7.7)
Neutrophils Relative %: 79 %
Platelet Count: 187 10*3/uL (ref 150–400)
RBC: 2.98 MIL/uL — ABNORMAL LOW (ref 4.22–5.81)
RDW: 16.2 % — ABNORMAL HIGH (ref 11.5–15.5)
WBC Count: 4.6 10*3/uL (ref 4.0–10.5)
nRBC: 0 % (ref 0.0–0.2)

## 2019-11-17 LAB — CMP (CANCER CENTER ONLY)
ALT: 19 U/L (ref 0–44)
AST: 18 U/L (ref 15–41)
Albumin: 2.6 g/dL — ABNORMAL LOW (ref 3.5–5.0)
Alkaline Phosphatase: 153 U/L — ABNORMAL HIGH (ref 38–126)
Anion gap: 15 (ref 5–15)
BUN: 45 mg/dL — ABNORMAL HIGH (ref 8–23)
CO2: 30 mmol/L (ref 22–32)
Calcium: 8.7 mg/dL — ABNORMAL LOW (ref 8.9–10.3)
Chloride: 98 mmol/L (ref 98–111)
Creatinine: 4.74 mg/dL (ref 0.61–1.24)
GFR, Est AFR Am: 12 mL/min — ABNORMAL LOW (ref >60)
GFR, Estimated: 11 mL/min — ABNORMAL LOW (ref >60)
Glucose, Bld: 171 mg/dL — ABNORMAL HIGH (ref 70–99)
Potassium: 4.3 mmol/L (ref 3.5–5.1)
Sodium: 143 mmol/L (ref 135–145)
Total Bilirubin: 0.5 mg/dL (ref 0.3–1.2)
Total Protein: 5.8 g/dL — ABNORMAL LOW (ref 6.5–8.1)

## 2019-11-17 MED ORDER — PALONOSETRON HCL INJECTION 0.25 MG/5ML
INTRAVENOUS | Status: AC
  Filled 2019-11-17: qty 5

## 2019-11-17 MED ORDER — DIPHENHYDRAMINE HCL 50 MG/ML IJ SOLN
50.0000 mg | Freq: Once | INTRAMUSCULAR | Status: AC
  Administered 2019-11-17: 50 mg via INTRAVENOUS

## 2019-11-17 MED ORDER — PALONOSETRON HCL INJECTION 0.25 MG/5ML
0.2500 mg | Freq: Once | INTRAVENOUS | Status: AC
  Administered 2019-11-17: 10:00:00 0.25 mg via INTRAVENOUS

## 2019-11-17 MED ORDER — SODIUM CHLORIDE 0.9 % IV SOLN
45.0000 mg/m2 | Freq: Once | INTRAVENOUS | Status: AC
  Administered 2019-11-17: 84 mg via INTRAVENOUS
  Filled 2019-11-17: qty 14

## 2019-11-17 MED ORDER — FAMOTIDINE IN NACL 20-0.9 MG/50ML-% IV SOLN
INTRAVENOUS | Status: AC
  Filled 2019-11-17: qty 50

## 2019-11-17 MED ORDER — DIPHENHYDRAMINE HCL 50 MG/ML IJ SOLN
INTRAMUSCULAR | Status: AC
  Filled 2019-11-17: qty 1

## 2019-11-17 MED ORDER — SODIUM CHLORIDE 0.9 % IV SOLN
100.0000 mg | Freq: Once | INTRAVENOUS | Status: AC
  Administered 2019-11-17: 100 mg via INTRAVENOUS
  Filled 2019-11-17: qty 10

## 2019-11-17 MED ORDER — SODIUM CHLORIDE 0.9 % IV SOLN
20.0000 mg | Freq: Once | INTRAVENOUS | Status: AC
  Administered 2019-11-17: 20 mg via INTRAVENOUS
  Filled 2019-11-17: qty 20

## 2019-11-17 MED ORDER — SODIUM CHLORIDE 0.9 % IV SOLN
Freq: Once | INTRAVENOUS | Status: AC
  Filled 2019-11-17: qty 250

## 2019-11-17 MED ORDER — FAMOTIDINE IN NACL 20-0.9 MG/50ML-% IV SOLN
20.0000 mg | Freq: Once | INTRAVENOUS | Status: AC
  Administered 2019-11-17: 10:00:00 20 mg via INTRAVENOUS

## 2019-11-17 NOTE — Progress Notes (Signed)
Oncology Nurse Navigator Documentation  Oncology Nurse Navigator Flowsheets 11/17/2019  Abnormal Finding Date -  Confirmed Diagnosis Date -  Diagnosis Status -  Planned Course of Treatment Chemo/Radiation Concurrent  Phase of Treatment Chemo/Radiation Concurrent  Chemo/Radiation Concurrent Pending- Reason: Insurance Authorization  Chemo/Radiation Concurrent Actual Start Date: 10/27/2019  Navigator Follow Up Date: 11/17/2019  Navigator Follow Up Reason: Follow-up Appointment  Navigator Location [facility]  Referral Date to RadOnc/MedOnc -  Navigator Encounter Type Clinic/MDC;Other/I spoke with Mr. Meador and his caregiver today at his appt with Dr. Julien Nordmann.  Patient's caregiver states they are supposed to get help from the [place] at home and this has not happened.  I told them I would follow up with the [facility] about services.  I called Chelsea with the [place] at [phone] Ex [phone].  I was unable to reach her but did leave a vm message for her to call me with my name and phone number.   Telephone -  Treatment Initiated Date -  Patient Visit Type MedOnc  Treatment Phase Treatment  Barriers/Navigation Needs Coordination of Care;Education  Education Other  Interventions Coordination of Care;Education  Acuity Level 2-Minimal Needs (1-2 Barriers Identified)  Coordination of Care Other  Education Method Verbal  Time Spent with Patient 30

## 2019-11-17 NOTE — Telephone Encounter (Signed)
Scheduled per los. Given printout in infusion

## 2019-11-17 NOTE — Progress Notes (Signed)
OK to treat with today's creatinine

## 2019-11-17 NOTE — Telephone Encounter (Signed)
CRITICAL VALUE STICKER  CRITICAL VALUE: Creatinine  RECEIVER (on-site recipient of call): Lenox Ponds LPN  DATE & TIME NOTIFIED: 11/17/19 8:59 MESSENGER (representative from lab): Felizardo Hoffmann   MD NOTIFIED: Dr. Julien Nordmann  TIME OF NOTIFICATION: 9:05  RESPONSE: Dr. Julien Nordmann notified aware of result

## 2019-11-17 NOTE — Progress Notes (Signed)
[place] [phone]   [phone]  OFFICE PROGRESS NOTE  Dettinger, Fransisca Kaufmann, MD [place] [address]  DIAGNOSIS: Stage IIB (T3, N0, M0) non-small cell lung cancer, squamous cell carcinoma diagnosed in November 2020 and presented with large cavitary left lower lobe lung mass with no significant hilar or mediastinal lymphadenopathy and no distant metastatic disease.  PRIOR THERAPY: None  CURRENT THERAPY: Weekly concurrent chemoradiation with carboplatin for an AUC of 2 and paclitaxel 45 mg/m.First dose 10/27/2019. Status post 2 cycles.  INTERVAL HISTORY: Don [name] 79 y.o. male returns to the clinic today for follow-up visit accompanied by his son.  The patient continues to complain of increasing fatigue and weakness.  He has fall twice recently with some bruises on his head.  He denied having any current chest pain but has shortness of breath with exertion with no cough or hemoptysis.  He denied having any fever or chills.  He denied having any significant nausea, vomiting, diarrhea or constipation.  The patient denied having any recent weight loss or night sweats.  He is here today for evaluation before starting cycle #3.   MEDICAL HISTORY: Past Medical History:  Diagnosis Date  . Acute bronchitis 04/03/2014  . Anemia    low iron  . Anxiety   . Atrial fibrillation ([place])   . Cataract   . COPD (chronic obstructive pulmonary disease) ([place])   . Dementia ([place])   . Depression   . Dyspnea    with activity  . ESRD (end stage renal disease) ([place]) 04/03/2014   Stage 5 Dialysis on T/Th/Sa  . Essential hypertension   . Hyperlipidemia   . Lung cancer ([place])   . Macular degeneration   . Noncompliance with medications 04/2013   Xarelto, digoxin previously  . Polyposis coli   . Pre-diabetes   . Prostate cancer ([place])   . Tubular adenoma of colon 07/31/02, 11/18/03    ALLERGIES:  is allergic to wellbutrin  [bupropion].  MEDICATIONS:  Current Outpatient Medications  Medication Sig Dispense Refill  . albuterol (PROAIR HFA) 108 (90 Base) MCG/ACT inhaler Inhale 2 puffs into the lungs 2 (two) times daily.     [name] aspirin EC 81 MG tablet Take 243 mg by mouth once as needed for moderate pain (AS ADVISED BY ems).     [name] atorvastatin (LIPITOR) 20 MG tablet Take 1 tablet (20 mg total) by mouth daily. (Needs to be seen) (Patient taking differently: Take 20 mg by mouth at bedtime. ) 90 tablet 0  . bisoprolol (ZEBETA) 10 MG tablet TAKE (1) TABLET TWICE A DAY. 180 tablet 0  . budesonide-formoterol (SYMBICORT) 160-4.5 MCG/ACT inhaler Inhale 2 puffs into the lungs 2 (two) times daily.    . cephALEXin (KEFLEX) 500 MG capsule Take 500mg  tablet 3 times per week after finishing dialysis for 10 days (Patient taking differently: Take 500 mg by mouth every Tuesday, Thursday, and Saturday at 6 PM. Take 500mg  tablet 3 times per week after finishing dialysis for 10 days starting on 10/29/2019) 9 capsule 0  . clonazePAM (KLONOPIN) 1 MG tablet TAKE 1/2 TABLET AT BEDTIME (Patient taking differently: Take 0.5 mg by mouth at bedtime. ) 15 tablet 1  . diltiazem (CARDIZEM CD) 120 MG 24 hr capsule Take 1 capsule (120 mg total) by mouth daily. 90 capsule 3  . DULoxetine (CYMBALTA) 60 MG capsule Take 60 mg by mouth daily.    . ferrous sulfate 325 (65 FE)  MG tablet Take 325 mg by mouth every Monday, Wednesday, and Friday.    . hydroxypropyl methylcellulose / hypromellose (ISOPTO TEARS / GONIOVISC) 2.5 % ophthalmic solution Place 1 drop into both eyes 2 (two) times daily as needed for dry eyes.     . iron polysaccharides (FERREX 150) 150 MG capsule Take 1 capsule (150 mg total) by mouth daily. 90 capsule 0  . lidocaine-prilocaine (EMLA) cream Apply 1 application topically as needed. (Patient taking differently: Apply 1 application topically every Tuesday, Thursday, and Saturday at 6 PM. ) 30 g 0  . loratadine (CLARITIN) 10 MG tablet TAKE 1  TABLET DAILY (Patient taking differently: Take 10 mg by mouth daily. ) 30 tablet 11  . Melatonin 3 MG TABS Take 6 mg by mouth at bedtime. Recently increased to 6 mg per POA    . Multiple Vitamins-Minerals (MENS ONE DAILY PO) Take 1 tablet by mouth at bedtime.     . Multiple Vitamins-Minerals (PRESERVISION AREDS 2+MULTI VIT) CAPS Take 1 capsule by mouth 2 (two) times daily.    . Nutritional Supplements (FEEDING SUPPLEMENT, NEPRO CARB STEADY,) LIQD Take 237 mLs by mouth 2 (two) times daily between meals. 60 Can 0  . Omega-3 Fatty Acids (FISH OIL) 1000 MG CAPS Take 2 capsules by mouth 2 (two) times daily.    . pantoprazole (PROTONIX) 40 MG tablet TAKE (1) TABLET TWICE A DAY. (Patient taking differently: Take 40 mg by mouth 2 (two) times daily. ) 180 tablet 0  . prochlorperazine (COMPAZINE) 10 MG tablet Take 1 tablet (10 mg total) by mouth every 6 (six) hours as needed for nausea or vomiting. 30 tablet 2  . terbinafine (LAMISIL) 250 MG tablet Take 3 times weekly as directed on dialysis day after dialysis (Patient taking differently: Take 250 mg by mouth every Tuesday, Thursday, and Saturday at 6 PM. Take 3 times weekly as directed on dialysis day after dialysis) 36 tablet 0  . tiotropium (SPIRIVA) 18 MCG inhalation capsule Place 18 mcg into inhaler and inhale daily.     [name] triamcinolone cream (KENALOG) 0.1 % Apply 1 application topically daily as needed (for itching/skin irritation).     . warfarin (COUMADIN) 2 MG tablet TAKE 2 TABLETS (4MG ) DAILY EXCEPT ON MONDAY AND THURS TAKE 2 & 1/2 (5MG ) (Patient taking differently: Take 4 mg by mouth See admin instructions. TAKE 2 TABLETS (4MG ) DAILY EXCEPT ON MONDAY AND THURS TAKE 2 & 1/2 (5MG )) 70 tablet 0   No current facility-administered medications for this visit.    SURGICAL HISTORY:  Past Surgical History:  Procedure Laterality Date  . A/V FISTULAGRAM Left 05/30/2019   Procedure: A/V FISTULAGRAM;  Surgeon: Elam Dutch, MD;  Location: [facility];  Service: Cardiovascular;  Laterality: Left;  . Anal abcess,Hemorroids,    . AV FISTULA PLACEMENT Left 06/23/2019   Procedure: INSERTION OF ARTERIOVENOUS (AV) GORE-TEX GRAFT ARM;  Surgeon: Elam Dutch, MD;  Location: [facility];  Service: Vascular;  Laterality: Left;  . BASCILIC VEIN TRANSPOSITION Left 10/08/2018   Procedure: FIRST STAGE BASILIC VEIN TRANSPOSITION LEFT ARM;  Surgeon: Angelia Mould, MD;  Location: [facility];  Service: Vascular;  Laterality: Left;  . BASCILIC VEIN TRANSPOSITION Left 12/16/2018   Procedure: BASILIC VEIN TRANSPOSITION SECOND STAGE;  Surgeon: Angelia Mould, MD;  Location: [facility] OR;  Service: Vascular;  Laterality: Left;  . COLONOSCOPY N/A 04/05/2014   Dr. Fuller Plan: 6 mm sessile polyp from sigmoid colon (tubular adenoma), internal hemorrhoids  . COLONOSCOPY  W/ BIOPSIES  11/18/2003   Dr. Earle Gell  . ESOPHAGOGASTRODUODENOSCOPY  11/18/2003   Dr. Earle Gell  . ESOPHAGOGASTRODUODENOSCOPY N/A 04/05/2014   Dr. Fuller Plan: variable Z line, negative Barrett's, small hiatal hernia  . ESOPHAGOGASTRODUODENOSCOPY N/A 02/02/2017   Dr. Oneida Alar: LA Grade B esophagitis, one moderate benign-appearing intrinsic stenosis traversed, small non-bleeding diverticulum in second portion of duodenum, reactive gastropathy, no H.pylori.  . ESOPHAGOGASTRODUODENOSCOPY N/A 03/22/2018   Procedure: ESOPHAGOGASTRODUODENOSCOPY (EGD);  Surgeon: Daneil Dolin, MD;  [facility];  Service: Endoscopy;  Laterality: N/A;  . GIVENS CAPSULE STUDY N/A 03/22/2018   Procedure: GIVENS CAPSULE STUDY;  Surgeon: Daneil Dolin, MD;  [facility];  Service: Endoscopy;  Laterality: N/A;  . IR FLUORO GUIDE CV LINE RIGHT  10/29/2018  . IR REMOVAL TUN CV CATH W/O FL  08/06/2019  . IR US GUIDE VASC ACCESS RIGHT  10/29/2018  . PERIPHERAL VASCULAR BALLOON ANGIOPLASTY Left 03/14/2019   Procedure: PERIPHERAL VASCULAR BALLOON ANGIOPLASTY;  Surgeon: Angelia Mould, MD;  Location: [facility];  Service: Cardiovascular;  Laterality: Left;  . PERIPHERAL VASCULAR BALLOON ANGIOPLASTY  05/30/2019   Procedure: PERIPHERAL VASCULAR BALLOON ANGIOPLASTY;  Surgeon: Elam Dutch, MD;  Location: [facility];  Service: Cardiovascular;;  left arm fistula  . RETROPUBIC PROSTATECTOMY  11/26/2001  . TONSILLECTOMY    . VIDEO BRONCHOSCOPY WITH ENDOBRONCHIAL NAVIGATION N/A 09/26/2019   Procedure: VIDEO BRONCHOSCOPY WITH ENDOBRONCHIAL NAVIGATION;  Surgeon: Garner Nash, DO;  Location: [facility];  Service: Thoracic;  Laterality: N/A;  . VIDEO BRONCHOSCOPY WITH ENDOBRONCHIAL ULTRASOUND N/A 09/26/2019   Procedure: VIDEO BRONCHOSCOPY WITH ENDOBRONCHIAL ULTRASOUND;  Surgeon: Garner Nash, DO;  Location: [facility];  Service: Thoracic;  Laterality: N/A;    REVIEW OF SYSTEMS:  Constitutional: positive for fatigue Eyes: negative Ears, nose, mouth, throat, and face: negative Respiratory: positive for dyspnea on exertion Cardiovascular: negative Gastrointestinal: negative Genitourinary:negative Integument/breast: negative Hematologic/lymphatic: negative Musculoskeletal:positive for muscle weakness Neurological: negative Behavioral/Psych: negative Endocrine: negative Allergic/Immunologic: negative   PHYSICAL EXAMINATION: General appearance: alert, cooperative, fatigued and no distress Head: Normocephalic, without obvious abnormality, atraumatic Neck: no adenopathy, no JVD, supple, symmetrical, trachea midline and thyroid not enlarged, symmetric, no tenderness/mass/nodules Lymph nodes: Cervical, supraclavicular, and axillary nodes normal. Resp: clear to auscultation bilaterally Back: symmetric, no curvature. ROM normal. No CVA tenderness. Cardio: regular rate and rhythm, S1, S2 normal, no murmur, click, rub or gallop GI: soft, non-tender; bowel sounds normal; no masses,  no organomegaly Extremities: extremities normal, atraumatic, no cyanosis or edema Neurologic: Alert and  oriented X 3, normal strength and tone. Normal symmetric reflexes. Normal coordination and gait  ECOG PERFORMANCE STATUS: 2 - Symptomatic, <50% confined to bed  Blood pressure 104/68, pulse 89, temperature 97.8 F (36.6 C), temperature source Temporal, resp. rate 18, height 5\' 9"  (1.753 m), weight 156 lb (70.8 kg), SpO2 100 %.  LABORATORY DATA: Lab Results  Component Value Date   WBC 4.6 11/17/2019   HGB 9.1 (L) 11/17/2019   HCT 28.6 (L) 11/17/2019   MCV 96.0 11/17/2019   PLT 187 11/17/2019      Chemistry      Component Value Date/Time   NA 140 11/10/2019 2141   NA 143 02/27/2019 1120   K 4.7 11/10/2019 2141   CL 97 (L) 11/10/2019 2141   CO2 27 11/10/2019 2141   BUN 38 (H) 11/10/2019 2141   BUN 59 (H) 02/27/2019 1120   CREATININE 4.81 (H) 11/10/2019 2141   CREATININE  4.35 (HH) 11/10/2019 1429   CREATININE 1.41 (H) 05/15/2013 1150      Component Value Date/Time   CALCIUM 8.6 (L) 11/10/2019 2141   ALKPHOS 164 (H) 11/10/2019 1429   AST 19 11/10/2019 1429   ALT 18 11/10/2019 1429   BILITOT 0.4 11/10/2019 1429       RADIOGRAPHIC STUDIES: DG Chest 2 View  Result Date: 10/30/2019 CLINICAL DATA:  Chest pain. EXAM: CHEST - 2 VIEW COMPARISON:  PET-CT 09/29/2019. Chest x-ray 09/26/2019. CT 09/10/2019. FINDINGS: Mediastinum and hilar structures normal. Persistent left base mass and infiltrate. Small left pleural effusion again noted. Stable cardiomegaly no acute bony abnormality. Degenerative change thoracic spine. IMPRESSION: 1. Persistent left base mass and infiltrate. Small left pleural effusion again noted. Similar findings noted on prior studies. 2.  Stable cardiomegaly. Electronically Signed   By: Marcello Moores  Register   On: 10/30/2019 12:15   CT Head Wo Contrast  Result Date: 11/10/2019 CLINICAL DATA:  [place] and hit head on brick wall laceration to back of head EXAM: CT HEAD WITHOUT CONTRAST CT CERVICAL SPINE WITHOUT CONTRAST TECHNIQUE: Multidetector CT imaging of the head and  cervical spine was performed following the standard protocol without intravenous contrast. Multiplanar CT image reconstructions of the cervical spine were also generated. COMPARISON:  CT brain 10/30/2019, MRI 10/15/2019 FINDINGS: CT HEAD FINDINGS Brain: No acute territorial infarction, hemorrhage, or intracranial mass. Moderate atrophy. Moderate hypodensity within the bilateral white matter consistent with chronic small vessel ischemic change. Stable ventricle size. Vascular: No hyperdense vessels.  Carotid vascular calcification Skull: No fracture Sinuses/Orbits: Mucosal thickening in the left sphenoid sinus Other: Mild posterior scalp swelling. CT CERVICAL SPINE FINDINGS Alignment: 3 mm anterolisthesis C6 on C7, 2 mm anterolisthesis C5 on C6. Exaggeration of cervical lordosis. Facet alignment is maintained. Rotation of C1 on C2 is presumably due to positioning. Skull base and vertebrae: No fracture is seen. Chronic erosion involving the tip of the dens. Soft tissues and spinal canal: No prevertebral fluid or swelling. No visible canal hematoma. Disc levels: Advanced degenerative change at the C1-C2 articulation. Diffuse degenerative changes of the cervical spine, most advanced at C7-T1. Multiple level facet degenerative change and bilateral foraminal stenosis. Upper chest: Incompletely visualized left pleural effusion. No thyroid mass. Other: None IMPRESSION: 1. No CT evidence for acute intracranial abnormality. Atrophy and small vessel ischemic changes of the white matter 2. Trace anterolisthesis C5 on C6 and C6 on C7, suspect secondary to degenerative change. No acute fracture is seen. Multiple level degenerative changes. Chronic erosive changes at the dens. 3. Incompletely visualized left pleural effusion Electronically Signed   By: Donavan Foil M.D.   On: 11/10/2019 22:32   CT Head Wo Contrast  Result Date: 10/30/2019 CLINICAL DATA:  Chest pain, mental status changes. EXAM: CT HEAD WITHOUT CONTRAST  TECHNIQUE: Contiguous axial images were obtained from the base of the skull through the vertex without intravenous contrast. COMPARISON:  02/26/2019 FINDINGS: Brain: There is atrophy and chronic small vessel disease changes. No acute intracranial abnormality. Specifically, no hemorrhage, hydrocephalus, mass lesion, acute infarction, or significant intracranial injury. Vascular: No hyperdense vessel or unexpected calcification. Skull: No acute calvarial abnormality. Sinuses/Orbits: Visualized paranasal sinuses and mastoids clear. Orbital soft tissues unremarkable. Other: None IMPRESSION: Atrophy, chronic microvascular disease. No acute intracranial abnormality. Electronically Signed   By: Rolm Baptise M.D.   On: 10/30/2019 17:40   CT Cervical Spine Wo Contrast  Result Date: 11/10/2019 CLINICAL DATA:  [place] and hit head on brick wall laceration to back of  head EXAM: CT HEAD WITHOUT CONTRAST CT CERVICAL SPINE WITHOUT CONTRAST TECHNIQUE: Multidetector CT imaging of the head and cervical spine was performed following the standard protocol without intravenous contrast. Multiplanar CT image reconstructions of the cervical spine were also generated. COMPARISON:  CT brain 10/30/2019, MRI 10/15/2019 FINDINGS: CT HEAD FINDINGS Brain: No acute territorial infarction, hemorrhage, or intracranial mass. Moderate atrophy. Moderate hypodensity within the bilateral white matter consistent with chronic small vessel ischemic change. Stable ventricle size. Vascular: No hyperdense vessels.  Carotid vascular calcification Skull: No fracture Sinuses/Orbits: Mucosal thickening in the left sphenoid sinus Other: Mild posterior scalp swelling. CT CERVICAL SPINE FINDINGS Alignment: 3 mm anterolisthesis C6 on C7, 2 mm anterolisthesis C5 on C6. Exaggeration of cervical lordosis. Facet alignment is maintained. Rotation of C1 on C2 is presumably due to positioning. Skull base and vertebrae: No fracture is seen. Chronic erosion involving the tip  of the dens. Soft tissues and spinal canal: No prevertebral fluid or swelling. No visible canal hematoma. Disc levels: Advanced degenerative change at the C1-C2 articulation. Diffuse degenerative changes of the cervical spine, most advanced at C7-T1. Multiple level facet degenerative change and bilateral foraminal stenosis. Upper chest: Incompletely visualized left pleural effusion. No thyroid mass. Other: None IMPRESSION: 1. No CT evidence for acute intracranial abnormality. Atrophy and small vessel ischemic changes of the white matter 2. Trace anterolisthesis C5 on C6 and C6 on C7, suspect secondary to degenerative change. No acute fracture is seen. Multiple level degenerative changes. Chronic erosive changes at the dens. 3. Incompletely visualized left pleural effusion Electronically Signed   By: Donavan Foil M.D.   On: 11/10/2019 22:32    ASSESSMENT AND PLAN: This is a very pleasant 79 years old white male recently diagnosed with unresectable stage IIb non-small cell lung cancer, squamous cell carcinoma presented with large cavitary left lower lobe lung mass.  He also has history of end-stage renal disease and currently on hemodialysis. The patient is currently undergoing a course of concurrent chemoradiation with weekly carboplatin and paclitaxel.  He is status post 2 cycles of systemic chemotherapy and has been tolerating the treatment well except for the increasing fatigue and weakness. He had recent falls at home secondary to weakness.  He has home health service from the [place] facility but the patient and his son feels it it is not enough at this point.  They are requesting more hours and we will contact the [facility] facility for reevaluation and help with the patient.  He may also need a walker as well as a wheelchair. I recommended for the patient to continue his treatment with concurrent chemoradiation as planned and he will receive cycle #3 today. For the end-stage renal disease, he is currently on  hemodialysis Tuesday, Thursday and Saturday. The patient was advised to call immediately if he has any concerning symptoms in the interval. The patient voices understanding of current disease status and treatment options and is in agreement with the current care plan.  All questions were answered. The patient knows to call the clinic with any problems, questions or concerns. We can certainly see the patient much sooner if necessary.  I spent 15 minutes counseling the patient face to face. The total time spent in the appointment was 25 minutes.  Disclaimer: This note was dictated with voice recognition software. Similar sounding words can inadvertently be transcribed and may not be corrected upon review.

## 2019-11-18 ENCOUNTER — Ambulatory Visit: Payer: No Typology Code available for payment source

## 2019-11-18 ENCOUNTER — Ambulatory Visit: Payer: Medicare HMO

## 2019-11-18 ENCOUNTER — Ambulatory Visit: Payer: Medicare HMO | Admitting: Internal Medicine

## 2019-11-18 ENCOUNTER — Emergency Department (HOSPITAL_COMMUNITY)
Admission: EM | Admit: 2019-11-18 | Discharge: 2019-11-18 | Disposition: A | Discharge status: Home / Self Care | Payer: No Typology Code available for payment source | Attending: Emergency Medicine | Admitting: Emergency Medicine

## 2019-11-18 ENCOUNTER — Encounter (HOSPITAL_COMMUNITY): Payer: Self-pay

## 2019-11-18 ENCOUNTER — Telehealth: Payer: Self-pay | Admitting: Family Medicine

## 2019-11-18 ENCOUNTER — Other Ambulatory Visit: Payer: Medicare HMO

## 2019-11-18 ENCOUNTER — Other Ambulatory Visit: Payer: Self-pay

## 2019-11-18 DIAGNOSIS — Y929 Unspecified place or not applicable: Secondary | ICD-10-CM | POA: Diagnosis not present

## 2019-11-18 DIAGNOSIS — I5032 Chronic diastolic (congestive) heart failure: Secondary | ICD-10-CM | POA: Insufficient documentation

## 2019-11-18 DIAGNOSIS — I120 Hypertensive chronic kidney disease with stage 5 chronic kidney disease or end stage renal disease: Secondary | ICD-10-CM | POA: Diagnosis not present

## 2019-11-18 DIAGNOSIS — S41102A Unspecified open wound of left upper arm, initial encounter: Secondary | ICD-10-CM | POA: Insufficient documentation

## 2019-11-18 DIAGNOSIS — S40812A Abrasion of left upper arm, initial encounter: Secondary | ICD-10-CM | POA: Diagnosis not present

## 2019-11-18 DIAGNOSIS — Z7901 Long term (current) use of anticoagulants: Secondary | ICD-10-CM | POA: Diagnosis not present

## 2019-11-18 DIAGNOSIS — Z79899 Other long term (current) drug therapy: Secondary | ICD-10-CM | POA: Diagnosis not present

## 2019-11-18 DIAGNOSIS — Y9389 Activity, other specified: Secondary | ICD-10-CM | POA: Insufficient documentation

## 2019-11-18 DIAGNOSIS — R000 Tachycardia, unspecified: Secondary | ICD-10-CM | POA: Diagnosis not present

## 2019-11-18 DIAGNOSIS — W010XXA Fall on same level from slipping, tripping and stumbling without subsequent striking against object, initial encounter: Secondary | ICD-10-CM | POA: Diagnosis not present

## 2019-11-18 DIAGNOSIS — T148XXA Other injury of unspecified body region, initial encounter: Secondary | ICD-10-CM | POA: Diagnosis not present

## 2019-11-18 DIAGNOSIS — W19XXXA Unspecified fall, initial encounter: Secondary | ICD-10-CM | POA: Diagnosis not present

## 2019-11-18 DIAGNOSIS — Z992 Dependence on renal dialysis: Secondary | ICD-10-CM | POA: Diagnosis not present

## 2019-11-18 DIAGNOSIS — I132 Hypertensive heart and chronic kidney disease with heart failure and with stage 5 chronic kidney disease, or end stage renal disease: Secondary | ICD-10-CM | POA: Insufficient documentation

## 2019-11-18 DIAGNOSIS — Y999 Unspecified external cause status: Secondary | ICD-10-CM | POA: Diagnosis not present

## 2019-11-18 DIAGNOSIS — C3492 Malignant neoplasm of unspecified part of left bronchus or lung: Secondary | ICD-10-CM | POA: Diagnosis not present

## 2019-11-18 DIAGNOSIS — W19XXXD Unspecified fall, subsequent encounter: Secondary | ICD-10-CM

## 2019-11-18 DIAGNOSIS — J449 Chronic obstructive pulmonary disease, unspecified: Secondary | ICD-10-CM | POA: Diagnosis not present

## 2019-11-18 DIAGNOSIS — N186 End stage renal disease: Secondary | ICD-10-CM | POA: Insufficient documentation

## 2019-11-18 LAB — PROTIME-INR
INR: 3.3 — ABNORMAL HIGH (ref 0.8–1.2)
Prothrombin Time: 33.8 seconds — ABNORMAL HIGH (ref 11.4–15.2)

## 2019-11-18 NOTE — Telephone Encounter (Signed)
I have placed a recurring standing order for the patient, he can get those done every Monday but they need to make sure the results come to me so I can evaluate them Caryl Pina, MD [facility] 11/18/2019, 1:05 PM

## 2019-11-18 NOTE — Discharge Instructions (Addendum)
You should be contacted about home health soon Your coumadin/INR check will be resulted in the next few hours

## 2019-11-18 NOTE — Addendum Note (Signed)
Addended by: Caryl Pina on: 11/18/2019 01:05 PM   Modules accepted: Orders

## 2019-11-18 NOTE — ED Provider Notes (Signed)
[facility] EMERGENCY DEPARTMENT Provider Note   CSN: [id number] Arrival date & time: 11/18/19  0539     History Chief Complaint  Patient presents with  . Fall    Don [name] is a 79 y.o. male.  The history is provided by the patient.  Fall This is a new problem. The problem occurs constantly. The problem has not changed since onset.Pertinent negatives include no chest pain and no headaches. Nothing aggravates the symptoms. Nothing relieves the symptoms.   Patient with extensive history including A. fib on Coumadin, COPD, dementia, lung cancer, ESRD on dialysis presents with fall.  Patient reports he was trying to put his pants on he lost his footing and fell beside his bed.  No head injury.  No LOC.  He did sustain skin tears to his arms.  No new chest pain.    Past Medical History:  Diagnosis Date  . Acute bronchitis 04/03/2014  . Anemia    low iron  . Anxiety   . Atrial fibrillation ([place])   . Cataract   . COPD (chronic obstructive pulmonary disease) ([place])   . Dementia ([place])   . Depression   . Dyspnea    with activity  . ESRD (end stage renal disease) ([place]) 04/03/2014   Stage 5 Dialysis on T/Th/Sa  . Essential hypertension   . Hyperlipidemia   . Lung cancer ([place])   . Macular degeneration   . Noncompliance with medications 04/2013   Xarelto, digoxin previously  . Polyposis coli   . Pre-diabetes   . Prostate cancer ([place])   . Tubular adenoma of colon 07/31/02, 11/18/03    Patient Active Problem List   Diagnosis Date Noted  . Genetic testing 10/16/2019  . 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 Encounter for antineoplastic chemotherapy 10/03/2019  . S/P bronchoscopy   . Polyposis coli   . ESRD (end stage renal disease) ([place]) 10/30/2018  . COPD with acute exacerbation ([place]) 10/30/2018  . Acute on chronic diastolic CHF (congestive heart failure) ([place]) 10/30/2018  . Hemodialysis patient ([place])   . Acute renal failure superimposed on stage  5 chronic kidney disease, not on chronic dialysis ([place])   . Avulsion of skin of right forearm   . Fall   . Palliative care by specialist   . Goals of care, counseling/discussion   . DNR (do not resuscitate) discussion   . Atrial fibrillation with RVR ([name]) 03/18/2018  . Mass of lower lobe of left lung 02/12/2018  . Depression, recurrent ([place]) 01/02/2018  . Acute on chronic anemia   . Normocytic anemia   . Coagulopathy ([place]) 01/31/2017  . Pancytopenia ([place]) 01/31/2017  . Pre-diabetes 07/23/2015  . Metabolic syndrome 76/73/4193  . Urinary bladder incontinence 03/04/2015  . Hyperlipidemia LDL goal <130 04/04/2014  . Personal history of colonic polyps 04/04/2014  . Heme positive stool 04/03/2014  . History of prostate cancer 04/03/2014  . Tobacco abuse 04/25/2013  . Prostate cancer ([place]) 02/08/2011  . COPD (chronic obstructive pulmonary disease) ([place]) 02/08/2011  . Hypertension 02/08/2011    Past Surgical History:  Procedure Laterality Date  . A/V FISTULAGRAM Left 05/30/2019   Procedure: A/V FISTULAGRAM;  Surgeon: Elam Dutch, MD;  Location: [place] [facility];  Service: Cardiovascular;  Laterality: Left;  . Anal abcess,Hemorroids,    . AV FISTULA PLACEMENT Left 06/23/2019   Procedure: INSERTION OF ARTERIOVENOUS (AV) GORE-TEX GRAFT ARM;  Surgeon: Elam Dutch, MD;  Location: [facility];  Service:  Vascular;  Laterality: Left;  . BASCILIC VEIN TRANSPOSITION Left 10/08/2018   Procedure: FIRST STAGE BASILIC VEIN TRANSPOSITION LEFT ARM;  Surgeon: Angelia Mould, MD;  Location: [facility];  Service: Vascular;  Laterality: Left;  . BASCILIC VEIN TRANSPOSITION Left 12/16/2018   Procedure: BASILIC VEIN TRANSPOSITION SECOND STAGE;  Surgeon: Angelia Mould, MD;  Location: [facility] OR;  Service: Vascular;  Laterality: Left;  . COLONOSCOPY N/A 04/05/2014   Dr. Fuller Plan: 6 mm sessile polyp from sigmoid colon (tubular adenoma), internal hemorrhoids  . COLONOSCOPY W/ BIOPSIES  11/18/2003   Dr.  Earle Gell  . ESOPHAGOGASTRODUODENOSCOPY  11/18/2003   Dr. Earle Gell  . ESOPHAGOGASTRODUODENOSCOPY N/A 04/05/2014   Dr. Fuller Plan: variable Z line, negative Barrett's, small hiatal hernia  . ESOPHAGOGASTRODUODENOSCOPY N/A 02/02/2017   Dr. Oneida Alar: LA Grade B esophagitis, one moderate benign-appearing intrinsic stenosis traversed, small non-bleeding diverticulum in second portion of duodenum, reactive gastropathy, no H.pylori.  . ESOPHAGOGASTRODUODENOSCOPY N/A 03/22/2018   Procedure: ESOPHAGOGASTRODUODENOSCOPY (EGD);  Surgeon: Daneil Dolin, MD;  [facility];  Service: Endoscopy;  Laterality: N/A;  . GIVENS CAPSULE STUDY N/A 03/22/2018   Procedure: GIVENS CAPSULE STUDY;  Surgeon: Daneil Dolin, MD;  [facility];  Service: Endoscopy;  Laterality: N/A;  . IR FLUORO GUIDE CV LINE RIGHT  10/29/2018  . IR REMOVAL TUN CV CATH W/O FL  08/06/2019  . IR US GUIDE VASC ACCESS RIGHT  10/29/2018  . PERIPHERAL VASCULAR BALLOON ANGIOPLASTY Left 03/14/2019   Procedure: PERIPHERAL VASCULAR BALLOON ANGIOPLASTY;  Surgeon: Angelia Mould, MD;  Location: [facility];  Service: Cardiovascular;  Laterality: Left;  . PERIPHERAL VASCULAR BALLOON ANGIOPLASTY  05/30/2019   Procedure: PERIPHERAL VASCULAR BALLOON ANGIOPLASTY;  Surgeon: Elam Dutch, MD;  Location: [facility];  Service: Cardiovascular;;  left arm fistula  . RETROPUBIC PROSTATECTOMY  11/26/2001  . TONSILLECTOMY    . VIDEO BRONCHOSCOPY WITH ENDOBRONCHIAL NAVIGATION N/A 09/26/2019   Procedure: VIDEO BRONCHOSCOPY WITH ENDOBRONCHIAL NAVIGATION;  Surgeon: Garner Nash, DO;  Location: [facility];  Service: Thoracic;  Laterality: N/A;  . VIDEO BRONCHOSCOPY WITH ENDOBRONCHIAL ULTRASOUND N/A 09/26/2019   Procedure: VIDEO BRONCHOSCOPY WITH ENDOBRONCHIAL ULTRASOUND;  Surgeon: Garner Nash, DO;  Location: [facility] OR;  Service: Thoracic;  Laterality: N/A;       Family History  Problem Relation Age of Onset  .  Diabetes Father   . Heart disease Father 69       MI  . Heart attack Father   . Heart attack Mother   . Heart disease Brother   . Cancer Brother        lung  . Lung cancer Brother   . Heart disease Brother   . Cancer Brother        possibly riddled with cancer  . Lung cancer Sister   . Cancer Sister        lung  . Heart disease Brother   . Lung cancer Brother   . Other Brother 1       accident  . Heart disease Brother   . Lung cancer Nephew     Social History   Tobacco Use  . Smoking status: Former Smoker    Packs/day: 0.50    Years: 62.00    Pack years: 31.00    Types: Cigarettes, Cigars    Quit date: 10/27/2018    Years since quitting: 1.0  . Smokeless tobacco: Never Used  . Tobacco comment: "very little"  Substance Use  Topics  . Alcohol use: No  . Drug use: No    Home Medications Prior to Admission medications   Medication Sig Start Date End Date Taking? Authorizing Provider  albuterol (PROAIR HFA) 108 (90 Base) MCG/ACT inhaler Inhale 2 puffs into the lungs 2 (two) times daily.     [provider]  aspirin EC 81 MG tablet Take 243 mg by mouth once as needed for moderate pain (AS ADVISED BY ems).     [provider]  atorvastatin (LIPITOR) 20 MG tablet Take 1 tablet (20 mg total) by mouth daily. (Needs to be seen) Patient taking differently: Take 20 mg by mouth at bedtime.  10/08/19   Dettinger, Fransisca Kaufmann, MD  bisoprolol (ZEBETA) 10 MG tablet TAKE (1) TABLET TWICE A DAY. 11/12/19   Dettinger, Fransisca Kaufmann, MD  budesonide-formoterol (SYMBICORT) 160-4.5 MCG/ACT inhaler Inhale 2 puffs into the lungs 2 (two) times daily.    [provider]  cephALEXin (KEFLEX) 500 MG capsule Take 500mg  tablet 3 times per week after finishing dialysis for 10 days Patient taking differently: Take 500 mg by mouth every Tuesday, Thursday, and Saturday at 6 PM. Take 500mg  tablet 3 times per week after finishing dialysis for 10 days starting on 10/29/2019 10/29/19    Dettinger, Fransisca Kaufmann, MD  clonazePAM (KLONOPIN) 1 MG tablet TAKE 1/2 TABLET AT BEDTIME Patient taking differently: Take 0.5 mg by mouth at bedtime.  04/30/19   Dettinger, Fransisca Kaufmann, MD  diltiazem (CARDIZEM CD) 120 MG 24 hr capsule Take 1 capsule (120 mg total) by mouth daily. 04/08/19   Dettinger, Fransisca Kaufmann, MD  DULoxetine (CYMBALTA) 60 MG capsule Take 60 mg by mouth daily.    [provider]  ferrous sulfate 325 (65 FE) MG tablet Take 325 mg by mouth every Monday, Wednesday, and Friday.    [provider]  hydroxypropyl methylcellulose / hypromellose (ISOPTO TEARS / GONIOVISC) 2.5 % ophthalmic solution Place 1 drop into both eyes 2 (two) times daily as needed for dry eyes.     [provider]  iron polysaccharides (FERREX 150) 150 MG capsule Take 1 capsule (150 mg total) by mouth daily. 09/23/19   Dettinger, Fransisca Kaufmann, MD  lidocaine-prilocaine (EMLA) cream Apply 1 application topically as needed. Patient taking differently: Apply 1 application topically every Tuesday, Thursday, and Saturday at 6 PM.  06/03/19   Dettinger, Fransisca Kaufmann, MD  loratadine (CLARITIN) 10 MG tablet TAKE 1 TABLET DAILY Patient taking differently: Take 10 mg by mouth daily.  07/09/19   Dettinger, Fransisca Kaufmann, MD  Melatonin 3 MG TABS Take 6 mg by mouth at bedtime. Recently increased to 6 mg per POA    [provider]  Multiple Vitamins-Minerals (MENS ONE DAILY PO) Take 1 tablet by mouth at bedtime.     [provider]  Multiple Vitamins-Minerals (PRESERVISION AREDS 2+MULTI VIT) CAPS Take 1 capsule by mouth 2 (two) times daily.    [provider]  Nutritional Supplements (FEEDING SUPPLEMENT, NEPRO CARB STEADY,) LIQD Take 237 mLs by mouth 2 (two) times daily between meals. 10/31/18   Orson Eva, MD  Omega-3 Fatty Acids (FISH OIL) 1000 MG CAPS Take 2 capsules by mouth 2 (two) times daily.    [provider]  pantoprazole (PROTONIX) 40 MG tablet TAKE (1) TABLET TWICE A DAY. Patient  taking differently: Take 40 mg by mouth 2 (two) times daily.  10/03/19   Dettinger, Fransisca Kaufmann, MD  prochlorperazine (COMPAZINE) 10 MG tablet Take 1 tablet (10 mg  total) by mouth every 6 (six) hours as needed for nausea or vomiting. 10/03/19   Heilingoetter, Cassandra L, PA-C  terbinafine (LAMISIL) 250 MG tablet Take 3 times weekly as directed on dialysis day after dialysis Patient taking differently: Take 250 mg by mouth every Tuesday, Thursday, and Saturday at 6 PM. Take 3 times weekly as directed on dialysis day after dialysis 09/18/19   Dettinger, Fransisca Kaufmann, MD  tiotropium (SPIRIVA) 18 MCG inhalation capsule Place 18 mcg into inhaler and inhale daily.     [provider]  triamcinolone cream (KENALOG) 0.1 % Apply 1 application topically daily as needed (for itching/skin irritation).  06/13/19   [provider]  warfarin (COUMADIN) 2 MG tablet TAKE 2 TABLETS (4MG ) DAILY EXCEPT ON MONDAY AND THURS TAKE 2 & 1/2 (5MG ) Patient taking differently: Take 4 mg by mouth See admin instructions. TAKE 2 TABLETS (4MG ) DAILY EXCEPT ON MONDAY AND THURS TAKE 2 & 1/2 (5MG ) 10/08/19   Dettinger, Fransisca Kaufmann, MD    Allergies    Wellbutrin [bupropion]  Review of Systems   Review of Systems  Constitutional: Negative for fever.  Cardiovascular: Negative for chest pain.  Skin: Positive for wound.  Neurological: Negative for headaches.  All other systems reviewed and are negative.   Physical Exam Updated Vital Signs BP 115/82   Pulse 90   Temp 98 F (36.7 C)   Resp 19   Ht 1.753 m (5\' 9" )   Wt 70.8 kg   SpO2 97%   BMI 23.04 kg/m   Physical Exam CONSTITUTIONAL: Elderly and frail HEAD: Healing bruises noted to left side of the scalp, no new bruising noted.  No lacerations. EYES: EOMI/PERRL ENMT: Mucous membranes moist, no signs of facial trauma NECK: supple no meningeal signs SPINE/BACK:entire spine nontender, No bruising/crepitance/stepoffs noted to spine CV: no loud murmur Chest - no  tenderness or crepitus LUNGS: Lungs are clear to auscultation bilaterally, no apparent distress ABDOMEN: soft, nontender  NEURO: Pt is awake/alert/appropriate, moves all extremitiesx4.  No facial droop.  No arm or leg drift EXTREMITIES: pulses normal/equal, full ROM, skin tear noted to left upper arm posterior surface, no lacerations.  Left arm has dialysis access with thrill noted, no signs of injury to dialysis access All other extremities/joints palpated/ranged and nontender Pelvis stable SKIN: warm, color normal PSYCH: no abnormalities of mood noted, alert and oriented to situation  ED Results / Procedures / Treatments   Labs (all labs ordered are listed, but only abnormal results are displayed) Labs Reviewed  PROTIME-INR    EKG None  Radiology No results found.  Procedures Procedures   Medications Ordered in ED Medications - No data to display  ED Course  I have reviewed the triage vital signs and the nursing notes.     MDM Rules/Calculators/A&P                      Patient with extensive medical history including ESRD and lung cancer on radiation.  Patient has history of frequent falls, and tonight fell while trying to put his pants on while in the bed.  No signs of any acute head or spinal injury.  No signs of acute traumatic chest or abdominal injury Patient denies any dizziness at this time No focal weakness Patient is able to stand on his own No indication for traumatic imaging at this time  I had extensive conversation with his son [name] via phone.  He is frustrated because he is attempting to get  more assistance for the patient at home but has had many roadblocks.  Patient may eventually end up needing skilled nursing care. He has a PCP follow-up later this week and hopes to have further care provided.  I offered to order home health and case management referral to see if this will help expedite home health or even placement. Son is agreeable with this plan.  I  advised there is no indication for imaging at this time as no signs of head injury. He is requesting an INR check and this will be ordered.  Son will pick him up and take him to dialysis, but he may not be able to make it to his daily radiation. Final Clinical Impression(s) / ED Diagnoses Final diagnoses:  Fall, subsequent encounter  Abrasion  ESRD (end stage renal disease) [facility])    Rx / DC Orders ED Discharge Orders    None       Ripley Fraise, MD 11/18/19 [phone]

## 2019-11-18 NOTE — Telephone Encounter (Signed)
I spoke with Don [name] (POA for pt) and advised he can speak with Dr Dettinger during the visit tomorrow regarding trying to get help etc and he voiced understanding.

## 2019-11-18 NOTE — ED Triage Notes (Addendum)
Pt fell beside his bed this morning. Michela Pitcher he lost his footing. And resulted in 2 skin tears on his right arm. Bruising on head is from prior fall. No other complaints. Pt supposed to go to  [facility] this morning. Pt has AV fistula in left arm . Pt lives at home. Arrived via ems. Family wanted him checked out

## 2019-11-19 ENCOUNTER — Ambulatory Visit: Payer: PRIVATE HEALTH INSURANCE | Admitting: Cardiology

## 2019-11-19 ENCOUNTER — Ambulatory Visit: Payer: No Typology Code available for payment source

## 2019-11-19 ENCOUNTER — Other Ambulatory Visit: Payer: Self-pay

## 2019-11-19 ENCOUNTER — Telehealth (INDEPENDENT_AMBULATORY_CARE_PROVIDER_SITE_OTHER): Payer: Medicare HMO | Admitting: Family Medicine

## 2019-11-19 DIAGNOSIS — H3530 Unspecified macular degeneration: Secondary | ICD-10-CM | POA: Diagnosis not present

## 2019-11-19 DIAGNOSIS — R296 Repeated falls: Secondary | ICD-10-CM

## 2019-11-19 DIAGNOSIS — J449 Chronic obstructive pulmonary disease, unspecified: Secondary | ICD-10-CM | POA: Diagnosis not present

## 2019-11-19 DIAGNOSIS — C3492 Malignant neoplasm of unspecified part of left bronchus or lung: Secondary | ICD-10-CM | POA: Diagnosis not present

## 2019-11-19 DIAGNOSIS — R531 Weakness: Secondary | ICD-10-CM | POA: Diagnosis not present

## 2019-11-19 DIAGNOSIS — F0390 Unspecified dementia without behavioral disturbance: Secondary | ICD-10-CM | POA: Diagnosis not present

## 2019-11-19 DIAGNOSIS — C3490 Malignant neoplasm of unspecified part of unspecified bronchus or lung: Secondary | ICD-10-CM | POA: Diagnosis not present

## 2019-11-19 DIAGNOSIS — I4891 Unspecified atrial fibrillation: Secondary | ICD-10-CM | POA: Diagnosis not present

## 2019-11-19 DIAGNOSIS — I5033 Acute on chronic diastolic (congestive) heart failure: Secondary | ICD-10-CM | POA: Diagnosis not present

## 2019-11-19 DIAGNOSIS — N186 End stage renal disease: Secondary | ICD-10-CM | POA: Diagnosis not present

## 2019-11-19 DIAGNOSIS — I132 Hypertensive heart and chronic kidney disease with heart failure and with stage 5 chronic kidney disease, or end stage renal disease: Secondary | ICD-10-CM | POA: Diagnosis not present

## 2019-11-19 DIAGNOSIS — G8929 Other chronic pain: Secondary | ICD-10-CM | POA: Diagnosis not present

## 2019-11-19 LAB — PROTIME-INR
INR: 2.5 — ABNORMAL HIGH (ref 0.9–1.2)
Prothrombin Time: 25.6 s — ABNORMAL HIGH (ref 9.1–12.0)

## 2019-11-19 NOTE — Progress Notes (Signed)
Virtual Visit via Video Note  I connected with Don [name] on 11/19/19 at 1605 by video and verified that I am speaking with the correct person using two identifiers. Don [name] is currently located at home and son are currently with her during visit. The provider, Fransisca Kaufmann Ren Aspinall, MD is located in their office at time of visit.  Call ended at 1650  I discussed the limitations, risks, security and privacy concerns of performing an evaluation and management service by video and the availability of in person appointments. I also discussed with the patient that there may be a patient responsible charge related to this service. The patient expressed understanding and agreed to proceed.   History and Present Illness: Patient is calling in today for issues with increased weakness and recurrent falls that has progressively worsened this year and his sons are having increased difficulty taking care of him at home and are wanting home health.   He missed his radiation. They are talking about discontinuing radiation and chemotherapy. He has been on dialysis and fallen twice. He has fallen 3 times in the past few weeks. They have a nurse just to check vitals once per week. They feel like he needs services.  He has approval for 16 hours per day of care from the [place] but just can't get it set up through them.  Patient was evaluated at the emergency department both times when he had falls.  Patient is considering stopping chemo radiation and doing comfort care. He is still discussing this  1. Falls frequently   2. Generalized weakness   3. 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 albuterol (PROAIR HFA) 108 (90 Base) MCG/ACT inhaler Inhale 2 puffs into the lungs 2 (two) times daily.   [name] aspirin EC 81 MG tablet Take 243 mg by mouth once as needed for moderate pain (AS ADVISED BY ems).   [name] atorvastatin (LIPITOR) 20 MG  tablet Take 1 tablet (20 mg total) by mouth daily. (Needs to be seen) (Patient taking differently: Take 20 mg by mouth at bedtime. )  . bisoprolol (ZEBETA) 10 MG tablet TAKE (1) TABLET TWICE A DAY.  . budesonide-formoterol (SYMBICORT) 160-4.5 MCG/ACT inhaler Inhale 2 puffs into the lungs 2 (two) times daily.  . cephALEXin (KEFLEX) 500 MG capsule Take 500mg  tablet 3 times per week after finishing dialysis for 10 days (Patient taking differently: Take 500 mg by mouth every Tuesday, Thursday, and Saturday at 6 PM. Take 500mg  tablet 3 times per week after finishing dialysis for 10 days starting on 10/29/2019)  . clonazePAM (KLONOPIN) 1 MG tablet TAKE 1/2 TABLET AT BEDTIME (Patient taking differently: Take 0.5 mg by mouth at bedtime. )  . diltiazem (CARDIZEM CD) 120 MG 24 hr capsule Take 1 capsule (120 mg total) by mouth daily.  . DULoxetine (CYMBALTA) 60 MG capsule Take 60 mg by mouth daily.  . ferrous sulfate 325 (65 FE) MG tablet Take 325 mg by mouth every Monday, Wednesday, and Friday.  . hydroxypropyl methylcellulose / hypromellose (ISOPTO TEARS / GONIOVISC) 2.5 % ophthalmic solution Place 1 drop into both eyes 2 (two) times daily as needed for dry eyes.   . iron polysaccharides (FERREX 150) 150 MG capsule Take 1 capsule (150 mg total) by mouth daily.  [name] lidocaine-prilocaine (EMLA) cream Apply 1 application topically as needed. (Patient taking differently: Apply 1 application topically every Tuesday, Thursday,  and Saturday at 6 PM. )  . loratadine (CLARITIN) 10 MG tablet TAKE 1 TABLET DAILY (Patient taking differently: Take 10 mg by mouth daily. )  . Melatonin 3 MG TABS Take 6 mg by mouth at bedtime. Recently increased to 6 mg per POA  . Multiple Vitamins-Minerals (MENS ONE DAILY PO) Take 1 tablet by mouth at bedtime.   . Multiple Vitamins-Minerals (PRESERVISION AREDS 2+MULTI VIT) CAPS Take 1 capsule by mouth 2 (two) times daily.  . Nutritional Supplements (FEEDING SUPPLEMENT, NEPRO CARB STEADY,) LIQD  Take 237 mLs by mouth 2 (two) times daily between meals.  . Omega-3 Fatty Acids (FISH OIL) 1000 MG CAPS Take 2 capsules by mouth 2 (two) times daily.  . pantoprazole (PROTONIX) 40 MG tablet TAKE (1) TABLET TWICE A DAY. (Patient taking differently: Take 40 mg by mouth 2 (two) times daily. )  . prochlorperazine (COMPAZINE) 10 MG tablet Take 1 tablet (10 mg total) by mouth every 6 (six) hours as needed for nausea or vomiting.  . terbinafine (LAMISIL) 250 MG tablet Take 3 times weekly as directed on dialysis day after dialysis (Patient taking differently: Take 250 mg by mouth every Tuesday, Thursday, and Saturday at 6 PM. Take 3 times weekly as directed on dialysis day after dialysis)  . tiotropium (SPIRIVA) 18 MCG inhalation capsule Place 18 mcg into inhaler and inhale daily.   [name] triamcinolone cream (KENALOG) 0.1 % Apply 1 application topically daily as needed (for itching/skin irritation).   . warfarin (COUMADIN) 2 MG tablet TAKE 2 TABLETS (4MG ) DAILY EXCEPT ON MONDAY AND THURS TAKE 2 & 1/2 (5MG ) (Patient taking differently: Take 4 mg by mouth See admin instructions. TAKE 2 TABLETS (4MG ) DAILY EXCEPT ON MONDAY AND THURS TAKE 2 & 1/2 (5MG ))   No facility-administered encounter medications on file as of 11/19/2019.    Review of Systems  Constitutional: Positive for fatigue. Negative for chills and fever.  Respiratory: Positive for cough. Negative for shortness of breath and wheezing.   Cardiovascular: Negative for chest pain and leg swelling.  Musculoskeletal: Negative for back pain and gait problem.  Skin: Negative for rash.  Neurological: Positive for dizziness and weakness.  Psychiatric/Behavioral: Negative for decreased concentration. The patient is not nervous/anxious.   All other systems reviewed and are negative.   Observations/Objective: Patient sounds and looks comfortable  Assessment and Plan: Problem List Items Addressed This Visit      Respiratory   Stage II squamous cell  carcinoma of left lung ([place])   Relevant Orders   Ambulatory referral to [facility]   For home use only DME Other see comment   For home use only DME Other see comment    Other Visit Diagnoses    Falls frequently    -  Primary   Relevant Orders   Ambulatory referral to [facility]   For home use only DME Other see comment   For home use only DME Other see comment   Generalized weakness       Relevant Orders   Ambulatory referral to [facility]   For home use only DME Other see comment   For home use only DME Other see comment      Description   Goal INR 2.0-3.0 INR today is 3.3 Hold today and restart 4 mg every day Recheck in 1 week     Follow up plan: Return in about 4 weeks (around 12/17/2019), or if symptoms worsen or fail to improve, for recheck falls.  We will  set up for home health care   I discussed the assessment and treatment plan with the patient. The patient was provided an opportunity to ask questions and all were answered. The patient agreed with the plan and demonstrated an understanding of the instructions.   The patient was advised to call back or seek an in-person evaluation if the symptoms worsen or if the condition fails to improve as anticipated.  The above assessment and management plan was discussed with the patient. The patient verbalized understanding of and has agreed to the management plan. Patient is aware to call the clinic if symptoms persist or worsen. Patient is aware when to return to the clinic for a follow-up visit. Patient educated on when it is appropriate to go to the emergency department.    I provided 45 minutes of non-face-to-face time during this encounter.    Worthy Rancher, MD

## 2019-11-20 ENCOUNTER — Ambulatory Visit: Payer: No Typology Code available for payment source

## 2019-11-20 DIAGNOSIS — I4891 Unspecified atrial fibrillation: Secondary | ICD-10-CM | POA: Diagnosis not present

## 2019-11-20 DIAGNOSIS — R0689 Other abnormalities of breathing: Secondary | ICD-10-CM | POA: Diagnosis not present

## 2019-11-20 DIAGNOSIS — R0789 Other chest pain: Secondary | ICD-10-CM | POA: Diagnosis not present

## 2019-11-20 DIAGNOSIS — R079 Chest pain, unspecified: Secondary | ICD-10-CM | POA: Diagnosis not present

## 2019-11-20 DIAGNOSIS — R0902 Hypoxemia: Secondary | ICD-10-CM | POA: Diagnosis not present

## 2019-11-21 ENCOUNTER — Ambulatory Visit (INDEPENDENT_AMBULATORY_CARE_PROVIDER_SITE_OTHER): Payer: Medicare HMO

## 2019-11-21 ENCOUNTER — Ambulatory Visit: Payer: No Typology Code available for payment source

## 2019-11-21 ENCOUNTER — Other Ambulatory Visit: Payer: Self-pay

## 2019-11-21 DIAGNOSIS — I080 Rheumatic disorders of both mitral and aortic valves: Secondary | ICD-10-CM | POA: Diagnosis not present

## 2019-11-21 DIAGNOSIS — N186 End stage renal disease: Secondary | ICD-10-CM | POA: Diagnosis not present

## 2019-11-21 DIAGNOSIS — N179 Acute kidney failure, unspecified: Secondary | ICD-10-CM | POA: Diagnosis not present

## 2019-11-21 DIAGNOSIS — R079 Chest pain, unspecified: Secondary | ICD-10-CM | POA: Diagnosis not present

## 2019-11-21 DIAGNOSIS — I10 Essential (primary) hypertension: Secondary | ICD-10-CM | POA: Diagnosis not present

## 2019-11-21 DIAGNOSIS — R000 Tachycardia, unspecified: Secondary | ICD-10-CM | POA: Diagnosis not present

## 2019-11-21 DIAGNOSIS — J90 Pleural effusion, not elsewhere classified: Secondary | ICD-10-CM | POA: Diagnosis not present

## 2019-11-21 DIAGNOSIS — I4819 Other persistent atrial fibrillation: Secondary | ICD-10-CM | POA: Diagnosis not present

## 2019-11-21 DIAGNOSIS — J9811 Atelectasis: Secondary | ICD-10-CM | POA: Diagnosis not present

## 2019-11-21 DIAGNOSIS — C3432 Malignant neoplasm of lower lobe, left bronchus or lung: Secondary | ICD-10-CM | POA: Diagnosis not present

## 2019-11-21 DIAGNOSIS — I4891 Unspecified atrial fibrillation: Secondary | ICD-10-CM | POA: Diagnosis not present

## 2019-11-21 DIAGNOSIS — Z992 Dependence on renal dialysis: Secondary | ICD-10-CM | POA: Diagnosis not present

## 2019-11-21 DIAGNOSIS — I517 Cardiomegaly: Secondary | ICD-10-CM | POA: Diagnosis not present

## 2019-11-21 DIAGNOSIS — R918 Other nonspecific abnormal finding of lung field: Secondary | ICD-10-CM | POA: Diagnosis not present

## 2019-11-22 DIAGNOSIS — C3432 Malignant neoplasm of lower lobe, left bronchus or lung: Secondary | ICD-10-CM | POA: Diagnosis not present

## 2019-11-22 DIAGNOSIS — N186 End stage renal disease: Secondary | ICD-10-CM | POA: Diagnosis not present

## 2019-11-22 DIAGNOSIS — I10 Essential (primary) hypertension: Secondary | ICD-10-CM | POA: Diagnosis not present

## 2019-11-22 DIAGNOSIS — Z992 Dependence on renal dialysis: Secondary | ICD-10-CM | POA: Diagnosis not present

## 2019-11-22 DIAGNOSIS — R079 Chest pain, unspecified: Secondary | ICD-10-CM | POA: Diagnosis not present

## 2019-11-22 DIAGNOSIS — I4819 Other persistent atrial fibrillation: Secondary | ICD-10-CM | POA: Diagnosis not present

## 2019-11-23 DIAGNOSIS — C3432 Malignant neoplasm of lower lobe, left bronchus or lung: Secondary | ICD-10-CM | POA: Diagnosis not present

## 2019-11-23 DIAGNOSIS — I4819 Other persistent atrial fibrillation: Secondary | ICD-10-CM | POA: Diagnosis not present

## 2019-11-23 DIAGNOSIS — N186 End stage renal disease: Secondary | ICD-10-CM | POA: Diagnosis not present

## 2019-11-23 DIAGNOSIS — R079 Chest pain, unspecified: Secondary | ICD-10-CM | POA: Diagnosis not present

## 2019-11-23 DIAGNOSIS — J90 Pleural effusion, not elsewhere classified: Secondary | ICD-10-CM | POA: Diagnosis not present

## 2019-11-23 DIAGNOSIS — J9811 Atelectasis: Secondary | ICD-10-CM | POA: Diagnosis not present

## 2019-11-23 DIAGNOSIS — Z992 Dependence on renal dialysis: Secondary | ICD-10-CM | POA: Diagnosis not present

## 2019-11-23 DIAGNOSIS — C801 Malignant (primary) neoplasm, unspecified: Secondary | ICD-10-CM | POA: Diagnosis not present

## 2019-11-24 ENCOUNTER — Inpatient Hospital Stay: Payer: No Typology Code available for payment source

## 2019-11-24 ENCOUNTER — Telehealth: Payer: Self-pay | Admitting: Radiation Oncology

## 2019-11-24 ENCOUNTER — Ambulatory Visit: Payer: No Typology Code available for payment source

## 2019-11-24 DIAGNOSIS — C3432 Malignant neoplasm of lower lobe, left bronchus or lung: Secondary | ICD-10-CM | POA: Diagnosis not present

## 2019-11-24 DIAGNOSIS — N186 End stage renal disease: Secondary | ICD-10-CM | POA: Diagnosis not present

## 2019-11-24 DIAGNOSIS — Z992 Dependence on renal dialysis: Secondary | ICD-10-CM | POA: Diagnosis not present

## 2019-11-24 DIAGNOSIS — I4819 Other persistent atrial fibrillation: Secondary | ICD-10-CM | POA: Diagnosis not present

## 2019-11-24 DIAGNOSIS — R079 Chest pain, unspecified: Secondary | ICD-10-CM | POA: Diagnosis not present

## 2019-11-24 MED ORDER — THERA PO TABS
1.00 | ORAL_TABLET | ORAL | Status: DC
Start: 2019-12-01 — End: 2019-11-24

## 2019-11-24 MED ORDER — SODIUM CHLORIDE 0.9 % IV SOLN
10.00 | INTRAVENOUS | Status: DC
Start: ? — End: 2019-11-24

## 2019-11-24 MED ORDER — UMECLIDINIUM BROMIDE 62.5 MCG/INH IN AEPB
1.00 | INHALATION_SPRAY | RESPIRATORY_TRACT | Status: DC
Start: 2019-12-01 — End: 2019-11-24

## 2019-11-24 MED ORDER — LORATADINE 10 MG PO TABS
10.00 | ORAL_TABLET | ORAL | Status: DC
Start: 2019-12-01 — End: 2019-11-24

## 2019-11-24 MED ORDER — ONDANSETRON HCL 4 MG/2ML IJ SOLN
4.00 | INTRAMUSCULAR | Status: DC
Start: ? — End: 2019-11-24

## 2019-11-24 MED ORDER — BUDESONIDE-FORMOTEROL FUMARATE 160-4.5 MCG/ACT IN AERO
2.00 | INHALATION_SPRAY | RESPIRATORY_TRACT | Status: DC
Start: 2019-11-30 — End: 2019-11-24

## 2019-11-24 MED ORDER — ACETAMINOPHEN 325 MG PO TABS
650.00 | ORAL_TABLET | ORAL | Status: DC
Start: ? — End: 2019-11-24

## 2019-11-24 MED ORDER — ALBUMIN HUMAN 25 % IV SOLN
12.50 | INTRAVENOUS | Status: DC
Start: ? — End: 2019-11-24

## 2019-11-24 MED ORDER — FERROUS SULFATE 325 (65 FE) MG PO TABS
325.00 | ORAL_TABLET | ORAL | Status: DC
Start: 2019-12-01 — End: 2019-11-24

## 2019-11-24 MED ORDER — ATORVASTATIN CALCIUM 20 MG PO TABS
20.00 | ORAL_TABLET | ORAL | Status: DC
Start: 2019-11-30 — End: 2019-11-24

## 2019-11-24 MED ORDER — SODIUM CHLORIDE 0.9 % IV SOLN
150.00 | INTRAVENOUS | Status: DC
Start: ? — End: 2019-11-24

## 2019-11-24 MED ORDER — DILTIAZEM HCL ER BEADS 180 MG PO CP24
180.00 | ORAL_CAPSULE | ORAL | Status: DC
Start: 2019-12-01 — End: 2019-11-24

## 2019-11-24 MED ORDER — MANNITOL 25 % IV SOLN
12.50 | INTRAVENOUS | Status: DC
Start: ? — End: 2019-11-24

## 2019-11-24 MED ORDER — METOPROLOL SUCCINATE ER 100 MG PO TB24
100.00 | ORAL_TABLET | ORAL | Status: DC
Start: 2019-12-01 — End: 2019-11-24

## 2019-11-24 MED ORDER — MELATONIN 3 MG PO TABS
6.00 | ORAL_TABLET | ORAL | Status: DC
Start: 2019-11-30 — End: 2019-11-24

## 2019-11-24 MED ORDER — GENERIC EXTERNAL MEDICATION
Status: DC
Start: ? — End: 2019-11-24

## 2019-11-24 MED ORDER — NITROGLYCERIN 0.4 MG SL SUBL
0.40 | SUBLINGUAL_TABLET | SUBLINGUAL | Status: DC
Start: ? — End: 2019-11-24

## 2019-11-24 MED ORDER — DIPHENHYDRAMINE HCL 50 MG/ML IJ SOLN
12.50 | INTRAMUSCULAR | Status: DC
Start: ? — End: 2019-11-24

## 2019-11-24 MED ORDER — FISH OIL 1000 MG PO CAPS
2.00 | ORAL_CAPSULE | ORAL | Status: DC
Start: 2019-11-30 — End: 2019-11-24

## 2019-11-24 MED ORDER — LIDOCAINE HCL 1 % IJ SOLN
0.10 | INTRAMUSCULAR | Status: DC
Start: ? — End: 2019-11-24

## 2019-11-24 MED ORDER — CLONAZEPAM 0.5 MG PO TABS
0.50 | ORAL_TABLET | ORAL | Status: DC
Start: ? — End: 2019-11-24

## 2019-11-24 MED ORDER — SODIUM CHLORIDE 0.9 % IJ SOLN
50.00 | INTRAMUSCULAR | Status: DC
Start: ? — End: 2019-11-24

## 2019-11-24 MED ORDER — ONDANSETRON HCL 4 MG PO TABS
4.00 | ORAL_TABLET | ORAL | Status: DC
Start: ? — End: 2019-11-24

## 2019-11-24 MED ORDER — ASPIRIN 81 MG PO CHEW
81.00 | CHEWABLE_TABLET | ORAL | Status: DC
Start: 2019-11-25 — End: 2019-11-24

## 2019-11-24 MED ORDER — PANTOPRAZOLE SODIUM 40 MG PO TBEC
40.00 | DELAYED_RELEASE_TABLET | ORAL | Status: DC
Start: 2019-11-30 — End: 2019-11-24

## 2019-11-24 MED ORDER — GENERIC EXTERNAL MEDICATION
4.00 | Status: DC
Start: 2019-11-30 — End: 2019-11-24

## 2019-11-24 MED ORDER — MUPIROCIN 2 % EX OINT
TOPICAL_OINTMENT | CUTANEOUS | Status: DC
Start: 2019-11-24 — End: 2019-11-24

## 2019-11-24 NOTE — Telephone Encounter (Signed)
Phoned [name] to inquire about status of the patient. No answer. Unable to leave message because mailbox is full.

## 2019-11-24 NOTE — Telephone Encounter (Signed)
I called and tried to contact the patient's son, [name] his healthcare POA, his voicemail is full, last week he wanted [place] to hold off on treatment as his father was admitted to a different hospital.  I am unable to find any documentation of the surrounding hospitals having him as an inpatient.  We will keep trying, nursing is also trying to reach out.

## 2019-11-24 NOTE — Telephone Encounter (Signed)
Patient did not show for 1145 treatment today. Phoned a second time to inquire about patient's status. No answer. Able to left a voicemail message with my direct contact number on the voicemail and request a return call. Awaiting call back.

## 2019-11-24 NOTE — Telephone Encounter (Signed)
Received call back from [place], [place]. He reports the patient was sitting in dialysis on Thursday and began complaining of chest pain. Patient was transferred from dialysis center to [place]. Since [facility] couldn't accommodate him he was transferred to [facility] where he has been since Friday. Don [name] understands from the doctors at [facility] that [name] has fluid around his lungs that they believe is causing his chest pain. Don [name] committed to contacting this RN once [name] returned home and could resume radiation. Will inform treatment machine and providers of these findings.

## 2019-11-25 ENCOUNTER — Ambulatory Visit: Payer: No Typology Code available for payment source

## 2019-11-25 DIAGNOSIS — I4819 Other persistent atrial fibrillation: Secondary | ICD-10-CM | POA: Diagnosis not present

## 2019-11-25 DIAGNOSIS — I10 Essential (primary) hypertension: Secondary | ICD-10-CM | POA: Diagnosis not present

## 2019-11-25 DIAGNOSIS — R079 Chest pain, unspecified: Secondary | ICD-10-CM | POA: Diagnosis not present

## 2019-11-25 DIAGNOSIS — N186 End stage renal disease: Secondary | ICD-10-CM | POA: Diagnosis not present

## 2019-11-25 DIAGNOSIS — Z992 Dependence on renal dialysis: Secondary | ICD-10-CM | POA: Diagnosis not present

## 2019-11-25 DIAGNOSIS — C3432 Malignant neoplasm of lower lobe, left bronchus or lung: Secondary | ICD-10-CM | POA: Diagnosis not present

## 2019-11-26 ENCOUNTER — Ambulatory Visit: Payer: No Typology Code available for payment source

## 2019-11-26 DIAGNOSIS — Z9889 Other specified postprocedural states: Secondary | ICD-10-CM | POA: Diagnosis not present

## 2019-11-26 DIAGNOSIS — N186 End stage renal disease: Secondary | ICD-10-CM | POA: Diagnosis not present

## 2019-11-26 DIAGNOSIS — Z992 Dependence on renal dialysis: Secondary | ICD-10-CM | POA: Diagnosis not present

## 2019-11-26 DIAGNOSIS — R079 Chest pain, unspecified: Secondary | ICD-10-CM | POA: Diagnosis not present

## 2019-11-26 DIAGNOSIS — C3432 Malignant neoplasm of lower lobe, left bronchus or lung: Secondary | ICD-10-CM | POA: Diagnosis not present

## 2019-11-26 DIAGNOSIS — I4819 Other persistent atrial fibrillation: Secondary | ICD-10-CM | POA: Diagnosis not present

## 2019-11-26 DIAGNOSIS — J90 Pleural effusion, not elsewhere classified: Secondary | ICD-10-CM | POA: Diagnosis not present

## 2019-11-27 ENCOUNTER — Ambulatory Visit: Payer: No Typology Code available for payment source

## 2019-11-27 DIAGNOSIS — R079 Chest pain, unspecified: Secondary | ICD-10-CM | POA: Diagnosis not present

## 2019-11-27 DIAGNOSIS — I4819 Other persistent atrial fibrillation: Secondary | ICD-10-CM | POA: Diagnosis not present

## 2019-11-27 DIAGNOSIS — C3432 Malignant neoplasm of lower lobe, left bronchus or lung: Secondary | ICD-10-CM | POA: Diagnosis not present

## 2019-11-27 DIAGNOSIS — I10 Essential (primary) hypertension: Secondary | ICD-10-CM | POA: Diagnosis not present

## 2019-11-27 DIAGNOSIS — Z992 Dependence on renal dialysis: Secondary | ICD-10-CM | POA: Diagnosis not present

## 2019-11-27 DIAGNOSIS — N186 End stage renal disease: Secondary | ICD-10-CM | POA: Diagnosis not present

## 2019-11-28 ENCOUNTER — Telehealth: Payer: Self-pay | Admitting: Family Medicine

## 2019-11-28 ENCOUNTER — Ambulatory Visit: Payer: No Typology Code available for payment source

## 2019-11-28 DIAGNOSIS — C3432 Malignant neoplasm of lower lobe, left bronchus or lung: Secondary | ICD-10-CM | POA: Diagnosis not present

## 2019-11-28 DIAGNOSIS — N186 End stage renal disease: Secondary | ICD-10-CM | POA: Diagnosis not present

## 2019-11-28 DIAGNOSIS — Z992 Dependence on renal dialysis: Secondary | ICD-10-CM | POA: Diagnosis not present

## 2019-11-28 DIAGNOSIS — I4819 Other persistent atrial fibrillation: Secondary | ICD-10-CM | POA: Diagnosis not present

## 2019-11-28 DIAGNOSIS — R079 Chest pain, unspecified: Secondary | ICD-10-CM | POA: Diagnosis not present

## 2019-11-28 NOTE — Telephone Encounter (Signed)
Please advise on question for medications.

## 2019-11-28 NOTE — Telephone Encounter (Signed)
Erroneous encounter-disregard

## 2019-11-28 NOTE — Telephone Encounter (Signed)
Aware.Patient in hospital. No change in medication directions from [place].

## 2019-11-28 NOTE — Telephone Encounter (Signed)
I do not have an INR from this week, its been over a week since have had 1, I do not know if he was going to get one at home or through his dialysis but I have not seen his INR this week yet so I cannot make any recommendations or changes.

## 2019-11-29 DIAGNOSIS — I10 Essential (primary) hypertension: Secondary | ICD-10-CM | POA: Diagnosis not present

## 2019-11-29 DIAGNOSIS — I4819 Other persistent atrial fibrillation: Secondary | ICD-10-CM | POA: Diagnosis not present

## 2019-11-29 DIAGNOSIS — N186 End stage renal disease: Secondary | ICD-10-CM | POA: Diagnosis not present

## 2019-11-29 DIAGNOSIS — R079 Chest pain, unspecified: Secondary | ICD-10-CM | POA: Diagnosis not present

## 2019-11-29 DIAGNOSIS — Z992 Dependence on renal dialysis: Secondary | ICD-10-CM | POA: Diagnosis not present

## 2019-11-29 DIAGNOSIS — C3432 Malignant neoplasm of lower lobe, left bronchus or lung: Secondary | ICD-10-CM | POA: Diagnosis not present

## 2019-11-30 DIAGNOSIS — D631 Anemia in chronic kidney disease: Secondary | ICD-10-CM | POA: Diagnosis not present

## 2019-11-30 DIAGNOSIS — T451X5A Adverse effect of antineoplastic and immunosuppressive drugs, initial encounter: Secondary | ICD-10-CM | POA: Diagnosis not present

## 2019-11-30 DIAGNOSIS — D61810 Antineoplastic chemotherapy induced pancytopenia: Secondary | ICD-10-CM | POA: Diagnosis not present

## 2019-11-30 DIAGNOSIS — J90 Pleural effusion, not elsewhere classified: Secondary | ICD-10-CM | POA: Diagnosis not present

## 2019-11-30 DIAGNOSIS — C3432 Malignant neoplasm of lower lobe, left bronchus or lung: Secondary | ICD-10-CM | POA: Diagnosis not present

## 2019-11-30 DIAGNOSIS — I4819 Other persistent atrial fibrillation: Secondary | ICD-10-CM | POA: Diagnosis not present

## 2019-11-30 DIAGNOSIS — R0781 Pleurodynia: Secondary | ICD-10-CM | POA: Diagnosis not present

## 2019-11-30 DIAGNOSIS — R5381 Other malaise: Secondary | ICD-10-CM | POA: Diagnosis not present

## 2019-11-30 DIAGNOSIS — N186 End stage renal disease: Secondary | ICD-10-CM | POA: Diagnosis not present

## 2019-12-01 ENCOUNTER — Inpatient Hospital Stay: Payer: No Typology Code available for payment source | Admitting: Physician Assistant

## 2019-12-01 ENCOUNTER — Telehealth: Payer: Self-pay | Admitting: Family Medicine

## 2019-12-01 ENCOUNTER — Inpatient Hospital Stay: Payer: No Typology Code available for payment source

## 2019-12-01 ENCOUNTER — Ambulatory Visit: Payer: No Typology Code available for payment source

## 2019-12-01 ENCOUNTER — Telehealth: Payer: Self-pay | Admitting: *Deleted

## 2019-12-01 MED ORDER — GENERIC EXTERNAL MEDICATION
Status: DC
Start: ? — End: 2019-12-01

## 2019-12-01 MED ORDER — SENNOSIDES-DOCUSATE SODIUM 8.6-50 MG PO TABS
2.00 | ORAL_TABLET | ORAL | Status: DC
Start: ? — End: 2019-12-01

## 2019-12-01 MED ORDER — SODIUM CHLORIDE 0.9 % IV SOLN
10.00 | INTRAVENOUS | Status: DC
Start: ? — End: 2019-12-01

## 2019-12-01 NOTE — Telephone Encounter (Signed)
See previous notes.

## 2019-12-01 NOTE — Telephone Encounter (Signed)
What is the name of the medication? Muscle Relaxer? that the [facility] gave him  Have you contacted your pharmacy to request a refill? No  Which pharmacy would you like this sent to? [facility]   Patient notified that their request is being sent to the clinical staff for review and that they should receive a call once it is complete. If they do not receive a call within 24 hours they can check with their pharmacy or our office.   Dettinger's pt.  I told him that we need the name of meds.

## 2019-12-01 NOTE — Telephone Encounter (Signed)
Don Heilingoetter, PA requests a visit with her before his treatment that is scheduled on 12/08/2019 since patient did not come to today's visit.  Attempted to reach son but had to request a returned call.

## 2019-12-02 ENCOUNTER — Telehealth: Payer: Self-pay | Admitting: Radiation Oncology

## 2019-12-02 ENCOUNTER — Telehealth: Payer: Self-pay | Admitting: Medical Oncology

## 2019-12-02 ENCOUNTER — Ambulatory Visit: Payer: No Typology Code available for payment source

## 2019-12-02 NOTE — Telephone Encounter (Signed)
Received message from Abelina Bachelor, RN that she received a message from the patient wishing to cancel treatment. Since patient didn't clarify which treatments this RN phoned to inquire further. Patient reports that due to his rapid decline in health he wishes to cancel all chemotherapy and radiation treatments. Patient denies additional needs from [facility]. Ensured patient has my direct number should anything change. Patient expressed appreciation for the call.

## 2019-12-02 NOTE — Telephone Encounter (Signed)
Pt left a  VM to "cancel all my chemo and radiation appts". I called pt back and requested he call me back for clarification of the dates for the appts to be cancelled.

## 2019-12-02 NOTE — Telephone Encounter (Signed)
Flexeril. He is aware that you return on 12/03/19

## 2019-12-03 ENCOUNTER — Ambulatory Visit: Payer: No Typology Code available for payment source

## 2019-12-03 MED ORDER — CYCLOBENZAPRINE HCL 5 MG PO TABS: 5.0000 mg | ORAL_TABLET | Freq: Three times a day (TID) | ORAL | 1 refills | Status: AC | PRN

## 2019-12-03 NOTE — Telephone Encounter (Signed)
I sent a refill for the muscle relaxer but tell him to be cautious with it because muscle relaxers can cause tiredness and lead to falls in the elderly sometimes.

## 2019-12-03 NOTE — Telephone Encounter (Signed)
Pt's son [name] [facility]) aware of provider feedback and voiced understanding.

## 2019-12-04 ENCOUNTER — Telehealth: Payer: Self-pay | Admitting: *Deleted

## 2019-12-04 ENCOUNTER — Ambulatory Visit: Payer: No Typology Code available for payment source

## 2019-12-04 MED ORDER — DIPHENHYDRAMINE HCL 25 MG PO CAPS: 50.0000 mg | ORAL_CAPSULE | Freq: Four times a day (QID) | ORAL | 0 refills | Status: AC | PRN

## 2019-12-04 NOTE — Telephone Encounter (Signed)
Hospice nurse called requesting verbal orders for Benadryl 50mg  1 PO every 6 hours as needed for itching. Verbal order given.

## 2019-12-05 ENCOUNTER — Ambulatory Visit: Payer: No Typology Code available for payment source

## 2019-12-08 ENCOUNTER — Other Ambulatory Visit: Payer: Medicare HMO

## 2019-12-08 ENCOUNTER — Ambulatory Visit: Payer: No Typology Code available for payment source

## 2019-12-08 ENCOUNTER — Ambulatory Visit: Payer: Medicare HMO

## 2019-12-09 ENCOUNTER — Ambulatory Visit: Payer: No Typology Code available for payment source

## 2019-12-10 ENCOUNTER — Telehealth: Payer: Self-pay | Admitting: Family Medicine

## 2019-12-10 ENCOUNTER — Ambulatory Visit: Payer: No Typology Code available for payment source

## 2019-12-10 NOTE — Telephone Encounter (Signed)
Returned [name]'s call and states that patient passed away yesterday and wanted to remind Dr. Warrick Parisian of something before he signed the death certificate. States that patient was doing good with dialysis and started to decline in health when he was dx of lung cancer.  It got worse when starting Chemo.

## 2019-12-10 NOTE — Telephone Encounter (Signed)
Son aware.

## 2019-12-10 NOTE — Telephone Encounter (Signed)
Okay thanks for the information, tell him we are sorry for the loss and wish them the best and we will keep them in our prayers and that they have our condolences

## 2019-12-11 ENCOUNTER — Ambulatory Visit: Payer: No Typology Code available for payment source

## 2019-12-12 ENCOUNTER — Ambulatory Visit: Payer: No Typology Code available for payment source

## 2019-12-15 ENCOUNTER — Ambulatory Visit: Payer: No Typology Code available for payment source

## 2019-12-15 ENCOUNTER — Ambulatory Visit: Payer: Medicare HMO

## 2019-12-15 ENCOUNTER — Other Ambulatory Visit: Payer: Medicare HMO

## 2019-12-15 ENCOUNTER — Ambulatory Visit: Payer: Medicare HMO | Admitting: Internal Medicine

## 2019-12-15 DEATH — deceased

## 2019-12-16 ENCOUNTER — Ambulatory Visit: Payer: No Typology Code available for payment source

## 2019-12-17 ENCOUNTER — Ambulatory Visit: Payer: No Typology Code available for payment source

## 2019-12-18 ENCOUNTER — Ambulatory Visit: Payer: No Typology Code available for payment source

## 2019-12-19 ENCOUNTER — Ambulatory Visit: Payer: No Typology Code available for payment source

## 2019-12-22 ENCOUNTER — Ambulatory Visit: Payer: No Typology Code available for payment source

## 2019-12-23 ENCOUNTER — Ambulatory Visit: Payer: No Typology Code available for payment source

## 2019-12-24 ENCOUNTER — Ambulatory Visit: Payer: No Typology Code available for payment source

## 2019-12-25 ENCOUNTER — Ambulatory Visit: Payer: No Typology Code available for payment source

## 2019-12-26 ENCOUNTER — Ambulatory Visit: Payer: No Typology Code available for payment source

## 2019-12-29 ENCOUNTER — Ambulatory Visit: Payer: No Typology Code available for payment source

## 2019-12-30 ENCOUNTER — Ambulatory Visit: Payer: No Typology Code available for payment source

## 2019-12-31 ENCOUNTER — Ambulatory Visit: Payer: No Typology Code available for payment source

## 2020-01-01 ENCOUNTER — Ambulatory Visit: Payer: No Typology Code available for payment source

## 2020-01-02 ENCOUNTER — Ambulatory Visit: Payer: No Typology Code available for payment source

## 2020-01-05 ENCOUNTER — Ambulatory Visit: Payer: No Typology Code available for payment source

## 2020-01-06 ENCOUNTER — Ambulatory Visit: Payer: No Typology Code available for payment source

## 2020-01-07 ENCOUNTER — Ambulatory Visit: Payer: No Typology Code available for payment source

## 2020-01-15 NOTE — Progress Notes (Signed)
  Radiation Oncology         [phone]) [phone] ________________________________  Name: Don [name] MRN: [id number]  Date: 11/17/2019  DOB: 1941-04-28  End of Treatment Note  Diagnosis:  Lung cancer     Indication for treatment::  curative       Radiation treatment dates:   11/10/19 - 11/17/19  Site/dose:   The patient was planned to treat the disease within the left lung initially to a dose of 60 Gy using a 5 field, 3-D conformal technique.   Narrative: The patient tolerated radiation treatment relatively well.   The patient did not experience esophagitis during the course of treatment which required management. Due to his status, the patient decided to discontinue his treatment after 4 fractions, receiving 8 Gy.  Plan: The patient has completed radiation treatment. He will return to clinic on a prn basis. ________________________________  Jodelle Gross, M.D., Ph.D.

## 2020-01-15 NOTE — Progress Notes (Signed)
  Radiation Oncology         [phone] ________________________________  Name: Don [name] MRN: [id number]  Date: 11/03/2019  DOB: 03/03/1941  SIMULATION AND TREATMENT PLANNING NOTE  DIAGNOSIS:     ICD-10-CM   1. 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  Identity was confirmed.  All relevant records and images related to the planned course of therapy were reviewed.   Written consent to proceed with treatment was confirmed which was freely given after reviewing the details related to the planned course of therapy had been reviewed with the patient.  Then, the patient was set-up in a stable reproducible  supine position for radiation therapy.  CT images were obtained.  Surface markings were placed.    Medically necessary complex treatment device(s) for immobilization:  Vac-lock bag.   The CT images were loaded into the planning software.  Then the target and avoidance structures were contoured.  Treatment planning then occurred.  The radiation prescription was entered and confirmed.  A total of 5 complex treatment devices were fabricated which relate to the designed radiation treatment fields. Additional reduced fields will be used as necessary to improve the dose homogeneity of the plan. Each of these customized fields/ complex treatment devices will be used on a daily basis during the radiation course. I have requested : 3D Simulation  I have requested a DVH of the following structures: target volume, spinal cord, lungs, heart.   The patient will undergo daily image guidance to ensure accurate localization of the target, and adequate minimize dose to the normal surrounding structures in close proximity to the target.  PLAN:  The patient will receive 60 Gy in 30 fractions initially.    Special treatment procedure The patient will also receive concurrent  chemotherapy during the treatment. The patient may therefore experience increased toxicity or side effects and the patient will be monitored for such problems. This may require extra lab work as necessary. This therefore constitutes a special treatment procedure.   ________________________________   Jodelle Gross, MD, PhD

## 2020-01-15 NOTE — Progress Notes (Signed)
  Radiation Oncology         [phone] ________________________________  Name: Don [name] MRN: [id number]  Date: 11/03/2019  DOB: Mar 09, 1941  RESPIRATORY MOTION MANAGEMENT SIMULATION  NARRATIVE:  In order to account for effect of respiratory motion on target structures and other organs in the planning and delivery of radiotherapy, this patient underwent respiratory motion management simulation.  To accomplish this, when the patient was brought to the CT simulation planning suite, 4D respiratoy motion management CT images were obtained.  The CT images were loaded into the planning software.  Then, using a variety of tools including Cine, MIP, and standard views, the target volume and planning target volumes (PTV) were delineated.  Avoidance structures were contoured.  Treatment planning then occurred.  Dose volume histograms were generated and reviewed for each of the requested structure.  The resulting plan was carefully reviewed and approved today.   ------------------------------------------------  Jodelle Gross, MD, PhD

## 2020-01-21 ENCOUNTER — Ambulatory Visit: Payer: Medicare HMO | Admitting: Cardiology
